# Patient Record
Sex: Female | Born: 1950 | Race: White | Hispanic: No | Marital: Single | Smoking: Never smoker
Health system: Southern US, Community
[De-identification: ages and names within clinical notes are randomized; demographics above are authoritative.]

## PROBLEM LIST (undated history)

## (undated) DIAGNOSIS — K08109 Complete loss of teeth, unspecified cause, unspecified class: Secondary | ICD-10-CM

## (undated) DIAGNOSIS — M1990 Unspecified osteoarthritis, unspecified site: Secondary | ICD-10-CM

## (undated) DIAGNOSIS — I509 Heart failure, unspecified: Secondary | ICD-10-CM

## (undated) DIAGNOSIS — R58 Hemorrhage, not elsewhere classified: Secondary | ICD-10-CM

## (undated) DIAGNOSIS — R918 Other nonspecific abnormal finding of lung field: Secondary | ICD-10-CM

## (undated) DIAGNOSIS — D510 Vitamin B12 deficiency anemia due to intrinsic factor deficiency: Secondary | ICD-10-CM

## (undated) DIAGNOSIS — K922 Gastrointestinal hemorrhage, unspecified: Secondary | ICD-10-CM

## (undated) DIAGNOSIS — J40 Bronchitis, not specified as acute or chronic: Secondary | ICD-10-CM

## (undated) DIAGNOSIS — K7460 Unspecified cirrhosis of liver: Secondary | ICD-10-CM

## (undated) DIAGNOSIS — Z1211 Encounter for screening for malignant neoplasm of colon: Secondary | ICD-10-CM

## (undated) DIAGNOSIS — K279 Peptic ulcer, site unspecified, unspecified as acute or chronic, without hemorrhage or perforation: Secondary | ICD-10-CM

## (undated) DIAGNOSIS — I7300 Raynaud's syndrome without gangrene: Secondary | ICD-10-CM

## (undated) DIAGNOSIS — D500 Iron deficiency anemia secondary to blood loss (chronic): Secondary | ICD-10-CM

## (undated) DIAGNOSIS — Z972 Presence of dental prosthetic device (complete) (partial): Secondary | ICD-10-CM

## (undated) DIAGNOSIS — K760 Fatty (change of) liver, not elsewhere classified: Secondary | ICD-10-CM

## (undated) DIAGNOSIS — D649 Anemia, unspecified: Secondary | ICD-10-CM

## (undated) DIAGNOSIS — E119 Type 2 diabetes mellitus without complications: Secondary | ICD-10-CM

## (undated) DIAGNOSIS — E538 Deficiency of other specified B group vitamins: Secondary | ICD-10-CM

## (undated) HISTORY — DX: Encounter for screening for malignant neoplasm of colon: Z12.11

## (undated) HISTORY — DX: Vitamin B12 deficiency anemia due to intrinsic factor deficiency: D51.0

## (undated) HISTORY — DX: Deficiency of other specified B group vitamins: E53.8

## (undated) HISTORY — PX: DENTAL SURGERY: SHX609

## (undated) HISTORY — DX: Iron deficiency anemia secondary to blood loss (chronic): D50.0

## (undated) HISTORY — PX: TONSILLECTOMY: SUR1361

## (undated) HISTORY — DX: Peptic ulcer, site unspecified, unspecified as acute or chronic, without hemorrhage or perforation: K27.9

## (undated) HISTORY — PX: EYE SURGERY: SHX253

## (undated) HISTORY — DX: Gastrointestinal hemorrhage, unspecified: K92.2

---

## 2015-01-07 ENCOUNTER — Emergency Department: Payer: Self-pay | Admitting: Student

## 2017-05-24 DIAGNOSIS — R5381 Other malaise: Secondary | ICD-10-CM | POA: Diagnosis not present

## 2017-05-24 DIAGNOSIS — D649 Anemia, unspecified: Secondary | ICD-10-CM | POA: Diagnosis not present

## 2017-05-24 DIAGNOSIS — E7800 Pure hypercholesterolemia, unspecified: Secondary | ICD-10-CM | POA: Diagnosis not present

## 2017-05-24 DIAGNOSIS — E119 Type 2 diabetes mellitus without complications: Secondary | ICD-10-CM | POA: Diagnosis not present

## 2017-06-24 DIAGNOSIS — Z23 Encounter for immunization: Secondary | ICD-10-CM | POA: Diagnosis not present

## 2017-06-24 DIAGNOSIS — S8992XA Unspecified injury of left lower leg, initial encounter: Secondary | ICD-10-CM | POA: Diagnosis not present

## 2017-06-24 DIAGNOSIS — S99912A Unspecified injury of left ankle, initial encounter: Secondary | ICD-10-CM | POA: Diagnosis not present

## 2017-06-24 DIAGNOSIS — S8782XA Crushing injury of left lower leg, initial encounter: Secondary | ICD-10-CM | POA: Diagnosis not present

## 2017-06-24 DIAGNOSIS — S99922A Unspecified injury of left foot, initial encounter: Secondary | ICD-10-CM | POA: Diagnosis not present

## 2017-06-27 DIAGNOSIS — S9702XA Crushing injury of left ankle, initial encounter: Secondary | ICD-10-CM | POA: Diagnosis not present

## 2017-06-27 DIAGNOSIS — E669 Obesity, unspecified: Secondary | ICD-10-CM | POA: Diagnosis not present

## 2017-12-12 DIAGNOSIS — H2513 Age-related nuclear cataract, bilateral: Secondary | ICD-10-CM | POA: Diagnosis not present

## 2017-12-12 DIAGNOSIS — E119 Type 2 diabetes mellitus without complications: Secondary | ICD-10-CM | POA: Diagnosis not present

## 2017-12-12 DIAGNOSIS — H52223 Regular astigmatism, bilateral: Secondary | ICD-10-CM | POA: Diagnosis not present

## 2017-12-12 DIAGNOSIS — H5203 Hypermetropia, bilateral: Secondary | ICD-10-CM | POA: Diagnosis not present

## 2018-01-18 DIAGNOSIS — H2513 Age-related nuclear cataract, bilateral: Secondary | ICD-10-CM | POA: Diagnosis not present

## 2018-01-25 DIAGNOSIS — H2511 Age-related nuclear cataract, right eye: Secondary | ICD-10-CM | POA: Diagnosis not present

## 2018-01-26 ENCOUNTER — Other Ambulatory Visit: Payer: Self-pay

## 2018-01-26 ENCOUNTER — Encounter: Payer: Self-pay | Admitting: Emergency Medicine

## 2018-01-26 ENCOUNTER — Ambulatory Visit
Admission: EM | Admit: 2018-01-26 | Discharge: 2018-01-26 | Disposition: A | Discharge status: Home / Self Care | Payer: Medicare HMO | Attending: Family Medicine | Admitting: Family Medicine

## 2018-01-26 DIAGNOSIS — K1120 Sialoadenitis, unspecified: Secondary | ICD-10-CM | POA: Diagnosis not present

## 2018-01-26 HISTORY — DX: Type 2 diabetes mellitus without complications: E11.9

## 2018-01-26 MED ORDER — AMOXICILLIN-POT CLAVULANATE 875-125 MG PO TABS: 1.0000 | ORAL_TABLET | Freq: Two times a day (BID) | ORAL | 0 refills | Status: AC

## 2018-01-26 NOTE — Discharge Instructions (Signed)
Take antibiotics as prescribed.  He may use Tylenol as needed for pain.  Try using sour candies, warm compresses and gentle massage to the swollen gland to help improve infection.  Return to the ER for any fevers, increasing pain, swelling, worsening symptoms or urgent changes in health.

## 2018-01-26 NOTE — ED Provider Notes (Signed)
[facility]    CSN: [id number] Arrival date & time: 01/26/18  0801     History   Chief Complaint Chief Complaint  Patient presents with  . Nasal Congestion  . Fever    HPI Crystal [name] is a 66 y.o. female presents to the urgent care facility for evaluation of right-sided facial swelling, mild runny nose, low-grade fever.  Symptoms been present for 1 day.  She denies any difficulty swallowing, headaches, cough, chest pain, shortness of breath, abdominal pain, nausea or vomiting.  She states she has had mild sinus pain, nasal congestion with increasing right-sided facial swelling along the submandibular gland.  HPI  Past Medical History:  Diagnosis Date  . Diabetes mellitus without complication ([place])     There are no active problems to display for this patient.   History reviewed. No pertinent surgical history.  OB History    No data available       Home Medications    Prior to Admission medications   Medication Sig Start Date End Date Taking? Authorizing Provider  ferrous sulfate 325 (65 FE) MG EC tablet Take 325 mg by mouth daily with breakfast.   Yes [provider]  metFORMIN (GLUCOPHAGE) 1000 MG tablet Take 1,000 mg by mouth 2 (two) times daily with a meal.   Yes [provider]  amoxicillin-clavulanate (AUGMENTIN) 875-125 MG tablet Take 1 tablet by mouth every 12 (twelve) hours for 10 days. 7 days 01/26/18 02/05/18  Duanne Guess, PA-C    Family History History reviewed. No pertinent family history.  Social History Social History   Tobacco Use  . Smoking status: Never Smoker  . Smokeless tobacco: Never Used  Substance Use Topics  . Alcohol use: No    Frequency: Never  . Drug use: Not on file     Allergies   Patient has no known allergies.   Review of Systems Review of Systems  Constitutional: Positive for fever. Negative for chills.  HENT: Positive for congestion, facial swelling and rhinorrhea. Negative  for ear discharge, sinus pressure, sinus pain, sore throat, trouble swallowing and voice change.   Respiratory: Negative for cough, shortness of breath, wheezing and stridor.   Cardiovascular: Negative for chest pain.  Gastrointestinal: Negative for abdominal pain, diarrhea, nausea and vomiting.  Genitourinary: Negative for dysuria, flank pain and pelvic pain.  Musculoskeletal: Negative for back pain and myalgias.  Skin: Negative for rash.  Neurological: Negative for dizziness and headaches.     Physical Exam Triage Vital Signs ED Triage Vitals  Enc Vitals Group     BP 01/26/18 0817 (!) 141/63     Pulse Rate 01/26/18 0817 93     Resp 01/26/18 0817 16     Temp 01/26/18 0817 99.3 F (37.4 C)     Temp Source 01/26/18 0817 Oral     SpO2 01/26/18 0817 96 %     Weight 01/26/18 0813 190 lb (86.2 kg)     Height 01/26/18 0813 5' 3" (1.6 m)     Head Circumference --      Peak Flow --      Pain Score 01/26/18 0813 6     Pain Loc --      Pain Edu? --      Excl. in [place]? --    No data found.  Updated Vital Signs BP (!) 141/63 (BP Location: Left Arm)   Pulse 93   Temp 99.3 F (37.4 C) (Oral)   Resp 16  Ht 5' 3" (1.6 m)   Wt 190 lb (86.2 kg)   SpO2 96%   BMI 33.66 kg/m   Visual Acuity Right Eye Distance:   Left Eye Distance:   Bilateral Distance:    Right Eye Near:   Left Eye Near:    Bilateral Near:     Physical Exam  Constitutional: She is oriented to person, place, and time. She appears well-developed and well-nourished. No distress.  HENT:  Head: Normocephalic and atraumatic.  Right Ear: Hearing, tympanic membrane, external ear and ear canal normal.  Left Ear: Hearing, tympanic membrane, external ear and ear canal normal.  Nose: Rhinorrhea present.  Mouth/Throat: Mucous membranes are normal. No trismus in the jaw. No uvula swelling. Posterior oropharyngeal erythema present. No oropharyngeal exudate, posterior oropharyngeal edema or tonsillar abscesses. No tonsillar  exudate.  Pharynx appears normal, no pharyngeal erythema or exudates.  Uvula is midline.  Patient with no purulent drainage intraorally along the ducts.  She does have submandibular gland swelling on the right side that is tender to palpation.  No significant warmth or redness.  Eyes: Conjunctivae are normal. Right eye exhibits no discharge. Left eye exhibits no discharge.  Neck: Normal range of motion.  Cardiovascular: Normal rate and regular rhythm.  Pulmonary/Chest: Effort normal and breath sounds normal. No stridor. No respiratory distress. She has no wheezes. She has no rales.  Abdominal: Soft. She exhibits no distension. There is no tenderness.  Musculoskeletal: Normal range of motion. She exhibits no deformity.  Lymphadenopathy:    She has cervical adenopathy.  Neurological: She is alert and oriented to person, place, and time. She has normal reflexes.  Skin: Skin is warm and dry.  Psychiatric: She has a normal mood and affect. Her behavior is normal. Thought content normal.     UC Treatments / Results  Labs (all labs ordered are listed, but only abnormal results are displayed) Labs Reviewed - No data to display  EKG  EKG Interpretation None       Radiology No results found.  Procedures Procedures (including critical care time)  Medications Ordered in UC Medications - No data to display   Initial Impression / Assessment and Plan / UC Course  I have reviewed the triage vital signs and the nursing notes.  Pertinent labs & imaging results that were available during my care of the patient were reviewed by me and considered in my medical decision making (see chart for details).    67 year old female with sialadenitis, right-sided facial swelling that began yesterday.  She does have low-grade fever, was started on oral antibiotic, Augmentin.  She is encouraged to use sour candies, warm compresses, gentle compression of the gland.  She is educated on signs and symptoms to  return to the ED or clinic for. Final Clinical Impressions(s) / UC Diagnoses   Final diagnoses:  Sialadenitis    ED Discharge Orders        Ordered    amoxicillin-clavulanate (AUGMENTIN) 875-125 MG tablet  Every 12 hours     01/26/18 0827          Duanne Guess, PA-C 01/26/18 [phone]

## 2018-01-26 NOTE — ED Triage Notes (Signed)
Patient c/o ongoing nasal congestion and redness, swelling and warmth on the right side of her face that started yesterday.  Patient reports low grade fever yesterday.

## 2018-01-30 ENCOUNTER — Other Ambulatory Visit: Payer: Self-pay

## 2018-01-31 NOTE — Discharge Instructions (Signed)
Cataract Surgery, Care After Refer to this sheet in the next few weeks. These instructions provide you with information about caring for yourself after your procedure. Your health care provider may also give you more specific instructions. Your treatment has been planned according to current medical practices, but problems sometimes occur. Call your health care provider if you have any problems or questions after your procedure. What can I expect after the procedure? After the procedure, it is common to have:  Itching.  Discomfort.  Fluid discharge.  Sensitivity to light and to touch.  Bruising.  Follow these instructions at home: [place] your eye every day for signs of infection. Watch for: ? Redness, swelling, or pain. ? Fluid, blood, or pus. ? Warmth. ? Bad smell. Activity  Avoid strenuous activities, such as playing contact sports, for as long as told by your health care provider.  Do not drive or operate heavy machinery until your health care provider approves.  Do not bend or lift heavy objects. Bending increases pressure in the eye. You can walk, climb stairs, and do light household chores.  Ask your health care provider when you can return to work. If you work in a dusty environment, you may be advised to wear protective eyewear for a period of time. General instructions  Take or apply over-the-counter and prescription medicines only as told by your health care provider. This includes eye drops.  Do not touch or rub your eyes.  If you were given a protective shield, wear it as told by your health care provider. If you were not given a protective shield, wear sunglasses as told by your health care provider to protect your eyes.  Keep the area around your eye clean and dry. Avoid swimming or allowing water to hit you directly in the face while showering until told by your health care provider. Keep soap and shampoo out of your eyes.  Do not put a contact lens  into the affected eye or eyes until your health care provider approves.  Keep all follow-up visits as told by your health care provider. This is important. Contact a health care provider if:   You have increased bruising around your eye.  You have pain that is not helped with medicine.  You have a fever.  You have redness, swelling, or pain in your eye.  You have fluid, blood, or pus coming from your incision.  Your vision gets worse. Get help right away if:  You have sudden vision loss. This information is not intended to replace advice given to you by your health care provider. Make sure you discuss any questions you have with your health care provider. Document Released: 05/19/2005 Document Revised: 03/09/2016 Document Reviewed: 09/09/2015 Elsevier Interactive Patient Education  2018 [place] Anesthesia, Adult, Care After These instructions provide you with information about caring for yourself after your procedure. Your health care provider may also give you more specific instructions. Your treatment has been planned according to current medical practices, but problems sometimes occur. Call your health care provider if you have any problems or questions after your procedure. What can I expect after the procedure? After the procedure, it is common to have:  Vomiting.  A sore throat.  Mental slowness.  It is common to feel:  Nauseous.  Cold or shivery.  Sleepy.  Tired.  Sore or achy, even in parts of your body where you did not have surgery.  Follow these instructions at home: For at least  24 hours after the procedure:  Do not: ? Participate in activities where you could fall or become injured. ? Drive. ? Use heavy machinery. ? Drink alcohol. ? Take sleeping pills or medicines that cause drowsiness. ? Make important decisions or sign legal documents. ? Take care of children on your own.  Rest. Eating and drinking  If you vomit, drink water,  juice, or soup when you can drink without vomiting.  Drink enough fluid to keep your urine clear or pale yellow.  Make sure you have little or no nausea before eating solid foods.  Follow the diet recommended by your health care provider. General instructions  Have a responsible adult stay with you until you are awake and alert.  Return to your normal activities as told by your health care provider. Ask your health care provider what activities are safe for you.  Take over-the-counter and prescription medicines only as told by your health care provider.  If you smoke, do not smoke without supervision.  Keep all follow-up visits as told by your health care provider. This is important. Contact a health care provider if:  You continue to have nausea or vomiting at home, and medicines are not helpful.  You cannot drink fluids or start eating again.  You cannot urinate after 8-12 hours.  You develop a skin rash.  You have fever.  You have increasing redness at the site of your procedure. Get help right away if:  You have difficulty breathing.  You have chest pain.  You have unexpected bleeding.  You feel that you are having a life-threatening or urgent problem. This information is not intended to replace advice given to you by your health care provider. Make sure you discuss any questions you have with your health care provider. Document Released: 02/05/2001 Document Revised: 04/03/2016 Document Reviewed: 10/14/2015 Elsevier Interactive Patient Education  [employer].

## 2018-02-06 ENCOUNTER — Ambulatory Visit: Payer: Medicare HMO | Admitting: Anesthesiology

## 2018-02-06 ENCOUNTER — Ambulatory Visit
Admission: RE | Admit: 2018-02-06 | Discharge: 2018-02-06 | Disposition: A | Discharge status: Home / Self Care | Payer: Medicare HMO | Source: Ambulatory Visit | Attending: Ophthalmology | Admitting: Ophthalmology

## 2018-02-06 ENCOUNTER — Encounter
Admission: RE | Disposition: A | Discharge status: Home / Self Care | Payer: Self-pay | Source: Ambulatory Visit | Attending: Ophthalmology

## 2018-02-06 DIAGNOSIS — K920 Hematemesis: Secondary | ICD-10-CM | POA: Diagnosis not present

## 2018-02-06 DIAGNOSIS — Z7984 Long term (current) use of oral hypoglycemic drugs: Secondary | ICD-10-CM | POA: Insufficient documentation

## 2018-02-06 DIAGNOSIS — E119 Type 2 diabetes mellitus without complications: Secondary | ICD-10-CM

## 2018-02-06 DIAGNOSIS — Z79899 Other long term (current) drug therapy: Secondary | ICD-10-CM

## 2018-02-06 DIAGNOSIS — H25811 Combined forms of age-related cataract, right eye: Secondary | ICD-10-CM | POA: Diagnosis not present

## 2018-02-06 DIAGNOSIS — I8501 Esophageal varices with bleeding: Secondary | ICD-10-CM | POA: Diagnosis not present

## 2018-02-06 DIAGNOSIS — D649 Anemia, unspecified: Secondary | ICD-10-CM | POA: Insufficient documentation

## 2018-02-06 DIAGNOSIS — H2511 Age-related nuclear cataract, right eye: Secondary | ICD-10-CM | POA: Insufficient documentation

## 2018-02-06 HISTORY — DX: Complete loss of teeth, unspecified cause, unspecified class: Z97.2

## 2018-02-06 HISTORY — DX: Anemia, unspecified: D64.9

## 2018-02-06 HISTORY — PX: CATARACT EXTRACTION W/PHACO: SHX586

## 2018-02-06 HISTORY — DX: Complete loss of teeth, unspecified cause, unspecified class: K08.109

## 2018-02-06 HISTORY — DX: Unspecified osteoarthritis, unspecified site: M19.90

## 2018-02-06 HISTORY — DX: Bronchitis, not specified as acute or chronic: J40

## 2018-02-06 LAB — GLUCOSE, CAPILLARY: Glucose-Capillary: 123 mg/dL — ABNORMAL HIGH (ref 65–99)

## 2018-02-06 SURGERY — PHACOEMULSIFICATION, CATARACT, WITH IOL INSERTION
Anesthesia: Monitor Anesthesia Care | Site: Eye | Laterality: Right | Wound class: "Clean "

## 2018-02-06 MED ORDER — MOXIFLOXACIN HCL 0.5 % OP SOLN
1.0000 [drp] | OPHTHALMIC | Status: DC | PRN
  Administered 2018-02-06 (×3): 1 [drp] via OPHTHALMIC

## 2018-02-06 MED ORDER — ARMC OPHTHALMIC DILATING DROPS
1.0000 "application " | OPHTHALMIC | Status: DC | PRN
  Administered 2018-02-06 (×3): 1 via OPHTHALMIC

## 2018-02-06 MED ORDER — ONDANSETRON HCL 4 MG/2ML IJ SOLN: 4.0000 mg | Freq: Once | INTRAMUSCULAR | Status: DC | PRN

## 2018-02-06 MED ORDER — CEFUROXIME OPHTHALMIC INJECTION 1 MG/0.1 ML
INJECTION | OPHTHALMIC | Status: DC | PRN
  Administered 2018-02-06: 0.1 mL via OPHTHALMIC

## 2018-02-06 MED ORDER — NA HYALUR & NA CHOND-NA HYALUR 0.4-0.35 ML IO KIT
PACK | INTRAOCULAR | Status: DC | PRN
  Administered 2018-02-06: 1 mL via INTRAOCULAR

## 2018-02-06 MED ORDER — EPINEPHRINE PF 1 MG/ML IJ SOLN
INTRAOCULAR | Status: DC | PRN
  Administered 2018-02-06: 88 mL via OPHTHALMIC

## 2018-02-06 MED ORDER — LACTATED RINGERS IV SOLN: INTRAVENOUS | Status: DC

## 2018-02-06 MED ORDER — FENTANYL CITRATE (PF) 100 MCG/2ML IJ SOLN
INTRAMUSCULAR | Status: DC | PRN
  Administered 2018-02-06: 50 ug via INTRAVENOUS

## 2018-02-06 MED ORDER — ACETAMINOPHEN 160 MG/5ML PO SOLN: 325.0000 mg | ORAL | Status: DC | PRN

## 2018-02-06 MED ORDER — LIDOCAINE HCL (PF) 2 % IJ SOLN
INTRAOCULAR | Status: DC | PRN
  Administered 2018-02-06: 1 mL

## 2018-02-06 MED ORDER — ACETAMINOPHEN 325 MG PO TABS: 650.0000 mg | ORAL_TABLET | Freq: Once | ORAL | Status: DC | PRN

## 2018-02-06 MED ORDER — BRIMONIDINE TARTRATE-TIMOLOL 0.2-0.5 % OP SOLN
OPHTHALMIC | Status: DC | PRN
  Administered 2018-02-06: 1 [drp] via OPHTHALMIC

## 2018-02-06 MED ORDER — MIDAZOLAM HCL 2 MG/2ML IJ SOLN
INTRAMUSCULAR | Status: DC | PRN
  Administered 2018-02-06: 2 mg via INTRAVENOUS

## 2018-02-06 SURGICAL SUPPLY — 27 items

## 2018-02-06 NOTE — Anesthesia Procedure Notes (Signed)
Procedure Name: MAC Performed by: Mayme Genta, CRNA Pre-anesthesia Checklist: Patient identified, Emergency Drugs available, Suction available, Timeout performed and Patient being monitored Patient Re-evaluated:Patient Re-evaluated prior to induction Oxygen Delivery Method: Nasal cannula Placement Confirmation: positive ETCO2

## 2018-02-06 NOTE — Anesthesia Postprocedure Evaluation (Signed)
Anesthesia Post Note  Patient: Crystal [name]  Procedure(s) Performed: CATARACT EXTRACTION PHACO AND INTRAOCULAR LENS PLACEMENT (IOC) RIGHT DIABETIC (Right Eye)  Patient location during evaluation: PACU Anesthesia Type: MAC Level of consciousness: awake and alert, oriented and patient cooperative Pain management: pain level controlled Vital Signs Assessment: post-procedure vital signs reviewed and stable Respiratory status: spontaneous breathing, nonlabored ventilation and respiratory function stable Cardiovascular status: blood pressure returned to baseline and stable Postop Assessment: adequate PO intake Anesthetic complications: no    Darrin Nipper

## 2018-02-06 NOTE — H&P (Signed)
The History and Physical notes are on paper, have been signed, and are to be scanned. The patient remains stable and unchanged from the H&P.   Previous H&P reviewed, patient examined, and there are no changes.  Crystal [name] 02/06/2018 7:41 AM

## 2018-02-06 NOTE — Op Note (Signed)
LOCATION:  [facility]   PREOPERATIVE DIAGNOSIS:    Nuclear sclerotic cataract right eye. H25.11   POSTOPERATIVE DIAGNOSIS:  Nuclear sclerotic cataract right eye.     PROCEDURE:  Phacoemusification with posterior chamber intraocular lens placement of the right eye   LENS:   Implant Name Type Inv. Item Serial No. Manufacturer Lot No. LRB No. Used  LENS IOL DIOP 24.5 - [id number] Intraocular Lens LENS IOL DIOP 24.5 [id number]  Right 1        ULTRASOUND TIME: 23 % of 2 minutes, 22 seconds.  CDE 33.3   SURGEON:  Wyonia Hough, MD   ANESTHESIA:  Topical with tetracaine drops and 2% Xylocaine jelly, augmented with 1% preservative-free intracameral lidocaine.    COMPLICATIONS:  None.   DESCRIPTION OF PROCEDURE:  The patient was identified in the holding room and transported to the operating room and placed in the supine position under the operating microscope.  The right eye was identified as the operative eye and it was prepped and draped in the usual sterile ophthalmic fashion.   A 1 millimeter clear-corneal paracentesis was made at the 12:00 position.  0.5 ml of preservative-free 1% lidocaine was injected into the anterior chamber. The anterior chamber was filled with Viscoat viscoelastic.  A 2.4 millimeter keratome was used to make a near-clear corneal incision at the 9:00 position.  A curvilinear capsulorrhexis was made with a cystotome and capsulorrhexis forceps.  Balanced salt solution was used to hydrodissect and hydrodelineate the nucleus.   Phacoemulsification was then used in stop and chop fashion to remove the lens nucleus and epinucleus.  The remaining cortex was then removed using the irrigation and aspiration handpiece. Provisc was then placed into the capsular bag to distend it for lens placement.  A lens was then injected into the capsular bag.  The remaining viscoelastic was aspirated.   Wounds were hydrated with balanced salt solution.  The anterior  chamber was inflated to a physiologic pressure with balanced salt solution.  No wound leaks were noted. Cefuroxime 0.1 ml of a 63m/ml solution was injected into the anterior chamber for a dose of 1 mg of intracameral antibiotic at the completion of the case.   Timolol and Brimonidine drops were applied to the eye.  The patient was taken to the recovery room in stable condition without complications of anesthesia or surgery.   Ashling Roane 02/06/2018, 8:04 AM

## 2018-02-06 NOTE — Anesthesia Preprocedure Evaluation (Signed)
Anesthesia Evaluation  Patient identified by MRN, date of birth, ID band Patient awake    Reviewed: Allergy & Precautions, NPO status , Patient's Chart, lab work & pertinent test results  History of Anesthesia Complications Negative for: history of anesthetic complications  Airway Mallampati: III  TM Distance: >3 FB Neck ROM: Full    Dental  (+) Lower Dentures, Upper Dentures   Pulmonary neg pulmonary ROS,    Pulmonary exam normal breath sounds clear to auscultation       Cardiovascular Exercise Tolerance: Good negative cardio ROS Normal cardiovascular exam Rhythm:Regular Rate:Normal     Neuro/Psych negative neurological ROS     GI/Hepatic negative GI ROS,   Endo/Other  diabetes, Type 2  Renal/GU negative Renal ROS     Musculoskeletal  (+) Arthritis ,   Abdominal   Peds  Hematology  (+) Blood dyscrasia, anemia ,   Anesthesia Other Findings   Reproductive/Obstetrics                             Anesthesia Physical Anesthesia Plan  ASA: II  Anesthesia Plan: MAC   Post-op Pain Management:    Induction: Intravenous  PONV Risk Score and Plan: 2 and TIVA and Midazolam  Airway Management Planned: Natural Airway  Additional Equipment:   Intra-op Plan:   Post-operative Plan:   Informed Consent: I have reviewed the patients History and Physical, chart, labs and discussed the procedure including the risks, benefits and alternatives for the proposed anesthesia with the patient or authorized representative who has indicated his/her understanding and acceptance.     Plan Discussed with: CRNA  Anesthesia Plan Comments:         Anesthesia Quick Evaluation

## 2018-02-06 NOTE — Transfer of Care (Signed)
Immediate Anesthesia Transfer of Care Note  Patient: Crystal [name]  Procedure(s) Performed: CATARACT EXTRACTION PHACO AND INTRAOCULAR LENS PLACEMENT (IOC) RIGHT DIABETIC (Right Eye)  Patient Location: PACU  Anesthesia Type: MAC  Level of Consciousness: awake, alert  and patient cooperative  Airway and Oxygen Therapy: Patient Spontanous Breathing and Patient connected to supplemental oxygen  Post-op Assessment: Post-op Vital signs reviewed, Patient's Cardiovascular Status Stable, Respiratory Function Stable, Patent Airway and No signs of Nausea or vomiting  Post-op Vital Signs: Reviewed and stable  Complications: No apparent anesthesia complications

## 2018-02-07 ENCOUNTER — Encounter: Payer: Self-pay | Admitting: Ophthalmology

## 2018-02-09 ENCOUNTER — Other Ambulatory Visit: Payer: Self-pay

## 2018-02-09 ENCOUNTER — Encounter: Payer: Self-pay | Admitting: Emergency Medicine

## 2018-02-09 ENCOUNTER — Inpatient Hospital Stay
Admission: EM | Admit: 2018-02-09 | Discharge: 2018-02-12 | DRG: 988 | Disposition: A | Discharge status: Home / Self Care | Payer: Medicare HMO | Attending: Internal Medicine | Admitting: Internal Medicine

## 2018-02-09 ENCOUNTER — Inpatient Hospital Stay: Payer: Medicare HMO

## 2018-02-09 DIAGNOSIS — D509 Iron deficiency anemia, unspecified: Secondary | ICD-10-CM | POA: Diagnosis present

## 2018-02-09 DIAGNOSIS — D500 Iron deficiency anemia secondary to blood loss (chronic): Secondary | ICD-10-CM

## 2018-02-09 DIAGNOSIS — K7460 Unspecified cirrhosis of liver: Secondary | ICD-10-CM | POA: Diagnosis present

## 2018-02-09 DIAGNOSIS — K5900 Constipation, unspecified: Secondary | ICD-10-CM | POA: Diagnosis present

## 2018-02-09 DIAGNOSIS — R000 Tachycardia, unspecified: Secondary | ICD-10-CM | POA: Diagnosis not present

## 2018-02-09 DIAGNOSIS — E119 Type 2 diabetes mellitus without complications: Secondary | ICD-10-CM | POA: Diagnosis not present

## 2018-02-09 DIAGNOSIS — K922 Gastrointestinal hemorrhage, unspecified: Secondary | ICD-10-CM | POA: Diagnosis present

## 2018-02-09 DIAGNOSIS — H2511 Age-related nuclear cataract, right eye: Secondary | ICD-10-CM | POA: Diagnosis not present

## 2018-02-09 DIAGNOSIS — Z7984 Long term (current) use of oral hypoglycemic drugs: Secondary | ICD-10-CM | POA: Diagnosis not present

## 2018-02-09 DIAGNOSIS — M19019 Primary osteoarthritis, unspecified shoulder: Secondary | ICD-10-CM | POA: Diagnosis not present

## 2018-02-09 DIAGNOSIS — I10 Essential (primary) hypertension: Secondary | ICD-10-CM | POA: Diagnosis not present

## 2018-02-09 DIAGNOSIS — I8501 Esophageal varices with bleeding: Principal | ICD-10-CM | POA: Diagnosis present

## 2018-02-09 DIAGNOSIS — R109 Unspecified abdominal pain: Secondary | ICD-10-CM | POA: Diagnosis not present

## 2018-02-09 DIAGNOSIS — K920 Hematemesis: Secondary | ICD-10-CM

## 2018-02-09 DIAGNOSIS — R52 Pain, unspecified: Secondary | ICD-10-CM

## 2018-02-09 DIAGNOSIS — Z79899 Other long term (current) drug therapy: Secondary | ICD-10-CM

## 2018-02-09 DIAGNOSIS — K921 Melena: Secondary | ICD-10-CM | POA: Diagnosis not present

## 2018-02-09 DIAGNOSIS — M1990 Unspecified osteoarthritis, unspecified site: Secondary | ICD-10-CM | POA: Diagnosis not present

## 2018-02-09 DIAGNOSIS — R040 Epistaxis: Secondary | ICD-10-CM | POA: Diagnosis not present

## 2018-02-09 DIAGNOSIS — D649 Anemia, unspecified: Secondary | ICD-10-CM | POA: Diagnosis not present

## 2018-02-09 DIAGNOSIS — D62 Acute posthemorrhagic anemia: Secondary | ICD-10-CM | POA: Diagnosis present

## 2018-02-09 DIAGNOSIS — R918 Other nonspecific abnormal finding of lung field: Secondary | ICD-10-CM | POA: Diagnosis not present

## 2018-02-09 DIAGNOSIS — R1011 Right upper quadrant pain: Secondary | ICD-10-CM

## 2018-02-09 HISTORY — DX: Gastrointestinal hemorrhage, unspecified: K92.2

## 2018-02-09 LAB — COMPREHENSIVE METABOLIC PANEL
ALT: 17 U/L (ref 14–54)
AST: 30 U/L (ref 15–41)
Albumin: 2.7 g/dL — ABNORMAL LOW (ref 3.5–5.0)
Alkaline Phosphatase: 162 U/L — ABNORMAL HIGH (ref 38–126)
Anion gap: 9 (ref 5–15)
BUN: 14 mg/dL (ref 6–20)
CO2: 21 mmol/L — ABNORMAL LOW (ref 22–32)
Calcium: 8.4 mg/dL — ABNORMAL LOW (ref 8.9–10.3)
Chloride: 108 mmol/L (ref 101–111)
Creatinine, Ser: 0.56 mg/dL (ref 0.44–1.00)
GFR calc Af Amer: >60 mL/min (ref >60)
GFR calc non Af Amer: >60 mL/min (ref >60)
Glucose, Bld: 187 mg/dL — ABNORMAL HIGH (ref 65–99)
Potassium: 4 mmol/L (ref 3.5–5.1)
Sodium: 138 mmol/L (ref 135–145)
Total Bilirubin: 0.9 mg/dL (ref 0.3–1.2)
Total Protein: 6.8 g/dL (ref 6.5–8.1)

## 2018-02-09 LAB — RETICULOCYTES
RBC.: 3.63 MIL/uL — ABNORMAL LOW (ref 3.80–5.20)
Retic Count, Absolute: 61.7 10*3/uL (ref 19.0–183.0)
Retic Ct Pct: 1.7 % (ref 0.4–3.1)

## 2018-02-09 LAB — IRON AND TIBC
Iron: 20 ug/dL — ABNORMAL LOW (ref 28–170)
Saturation Ratios: 6 % — ABNORMAL LOW (ref 10.4–31.8)
TIBC: 313 ug/dL (ref 250–450)
UIBC: 293 ug/dL

## 2018-02-09 LAB — CBC
HCT: 22.5 % — ABNORMAL LOW (ref 35.0–47.0)
Hemoglobin: 6.8 g/dL — ABNORMAL LOW (ref 12.0–16.0)
MCH: 19.3 pg — ABNORMAL LOW (ref 26.0–34.0)
MCHC: 30.1 g/dL — ABNORMAL LOW (ref 32.0–36.0)
MCV: 63.9 fL — ABNORMAL LOW (ref 80.0–100.0)
Platelets: 305 10*3/uL (ref 150–440)
RBC: 3.52 MIL/uL — ABNORMAL LOW (ref 3.80–5.20)
RDW: 20.3 % — ABNORMAL HIGH (ref 11.5–14.5)
WBC: 9.5 10*3/uL (ref 3.6–11.0)

## 2018-02-09 LAB — GLUCOSE, CAPILLARY
Glucose-Capillary: 137 mg/dL — ABNORMAL HIGH (ref 65–99)
Glucose-Capillary: 154 mg/dL — ABNORMAL HIGH (ref 65–99)
Glucose-Capillary: 83 mg/dL (ref 65–99)

## 2018-02-09 LAB — VITAMIN B12: Vitamin B-12: 103 pg/mL — ABNORMAL LOW (ref 180–914)

## 2018-02-09 LAB — ABO/RH: ABO/RH(D): O POS

## 2018-02-09 LAB — FOLATE: Folate: 14.3 ng/mL (ref >5.9)

## 2018-02-09 LAB — PREPARE RBC (CROSSMATCH)

## 2018-02-09 LAB — FERRITIN: Ferritin: 6 ng/mL — ABNORMAL LOW (ref 11–307)

## 2018-02-09 LAB — HEMOGLOBIN: Hemoglobin: 6.4 g/dL — ABNORMAL LOW (ref 12.0–16.0)

## 2018-02-09 MED ORDER — INSULIN ASPART 100 UNIT/ML ~~LOC~~ SOLN: 0.0000 [IU] | Freq: Every day | SUBCUTANEOUS | Status: DC

## 2018-02-09 MED ORDER — METFORMIN HCL 500 MG PO TABS
1000.0000 mg | ORAL_TABLET | Freq: Two times a day (BID) | ORAL | Status: DC
  Filled 2018-02-09: qty 2

## 2018-02-09 MED ORDER — OCTREOTIDE ACETATE 100 MCG/ML IJ SOLN
50.0000 ug | Freq: Once | INTRAMUSCULAR | Status: DC
  Filled 2018-02-09: qty 0.5

## 2018-02-09 MED ORDER — SODIUM CHLORIDE 0.9 % IV SOLN
80.0000 mg | Freq: Once | INTRAVENOUS | Status: AC
  Administered 2018-02-09: 80 mg via INTRAVENOUS
  Filled 2018-02-09: qty 80

## 2018-02-09 MED ORDER — ACETAMINOPHEN 650 MG RE SUPP: 650.0000 mg | Freq: Four times a day (QID) | RECTAL | Status: DC | PRN

## 2018-02-09 MED ORDER — SODIUM CHLORIDE 0.9 % IV SOLN
10.0000 mL/h | Freq: Once | INTRAVENOUS | Status: AC
  Administered 2018-02-09: 10 mL/h via INTRAVENOUS

## 2018-02-09 MED ORDER — SODIUM CHLORIDE 0.9 % IV SOLN
Freq: Once | INTRAVENOUS | Status: AC
  Administered 2018-02-09: 20:00:00 via INTRAVENOUS

## 2018-02-09 MED ORDER — PEG 3350-KCL-NA BICARB-NACL 420 G PO SOLR: 4000.0000 mL | Freq: Once | ORAL | Status: DC

## 2018-02-09 MED ORDER — ONDANSETRON HCL 4 MG/2ML IJ SOLN
4.0000 mg | Freq: Four times a day (QID) | INTRAMUSCULAR | Status: DC | PRN
  Administered 2018-02-11 (×2): 4 mg via INTRAVENOUS
  Filled 2018-02-09 (×2): qty 2

## 2018-02-09 MED ORDER — PANTOPRAZOLE SODIUM 40 MG IV SOLR: 40.0000 mg | Freq: Two times a day (BID) | INTRAVENOUS | Status: DC

## 2018-02-09 MED ORDER — ACETAMINOPHEN 325 MG PO TABS: 650.0000 mg | ORAL_TABLET | Freq: Four times a day (QID) | ORAL | Status: DC | PRN

## 2018-02-09 MED ORDER — SODIUM CHLORIDE 0.9 % IV SOLN
200.0000 mg | INTRAVENOUS | Status: AC
  Administered 2018-02-09 – 2018-02-10 (×2): 200 mg via INTRAVENOUS
  Filled 2018-02-09 (×3): qty 10

## 2018-02-09 MED ORDER — PANTOPRAZOLE SODIUM 40 MG IV SOLR
40.0000 mg | Freq: Once | INTRAVENOUS | Status: AC
  Administered 2018-02-09: 40 mg via INTRAVENOUS
  Filled 2018-02-09: qty 40

## 2018-02-09 MED ORDER — SODIUM CHLORIDE 0.9 % IV SOLN
25.0000 ug/h | INTRAVENOUS | Status: DC
  Administered 2018-02-10 – 2018-02-12 (×4): 25 ug/h via INTRAVENOUS
  Filled 2018-02-09 (×9): qty 1

## 2018-02-09 MED ORDER — SODIUM CHLORIDE 0.9 % IV SOLN
8.0000 mg/h | INTRAVENOUS | Status: DC
  Administered 2018-02-09 – 2018-02-12 (×7): 8 mg/h via INTRAVENOUS
  Filled 2018-02-09 (×8): qty 80

## 2018-02-09 MED ORDER — INSULIN ASPART 100 UNIT/ML ~~LOC~~ SOLN
0.0000 [IU] | Freq: Three times a day (TID) | SUBCUTANEOUS | Status: DC
  Administered 2018-02-09 – 2018-02-10 (×2): 2 [IU] via SUBCUTANEOUS
  Administered 2018-02-11: 12:00:00 1 [IU] via SUBCUTANEOUS
  Administered 2018-02-11: 09:00:00 2 [IU] via SUBCUTANEOUS
  Administered 2018-02-12 (×2): 1 [IU] via SUBCUTANEOUS
  Filled 2018-02-09 (×7): qty 1

## 2018-02-09 MED ORDER — SODIUM CHLORIDE 0.9 % IV SOLN
INTRAVENOUS | Status: DC
  Administered 2018-02-09: 15:00:00 via INTRAVENOUS

## 2018-02-09 MED ORDER — TECHNETIUM TC 99M-LABELED RED BLOOD CELLS IV KIT
20.0000 | PACK | Freq: Once | INTRAVENOUS | Status: AC | PRN
  Administered 2018-02-09: 20:00:00 20.9 via INTRAVENOUS

## 2018-02-09 MED ORDER — FERROUS SULFATE 325 (65 FE) MG PO TABS
325.0000 mg | ORAL_TABLET | Freq: Every day | ORAL | Status: DC
  Administered 2018-02-09 – 2018-02-12 (×3): 325 mg via ORAL
  Filled 2018-02-09 (×3): qty 1

## 2018-02-09 MED ORDER — IOPAMIDOL (ISOVUE-300) INJECTION 61%
100.0000 mL | Freq: Once | INTRAVENOUS | Status: AC | PRN
  Administered 2018-02-09: 100 mL via INTRAVENOUS

## 2018-02-09 MED ORDER — SODIUM CHLORIDE 0.9 % IV SOLN
Freq: Once | INTRAVENOUS | Status: AC
  Administered 2018-02-09: 09:00:00 via INTRAVENOUS

## 2018-02-09 MED ORDER — SODIUM CHLORIDE 0.9 % IV SOLN
INTRAVENOUS | Status: DC
  Administered 2018-02-10: 08:00:00 via INTRAVENOUS

## 2018-02-09 MED ORDER — ONDANSETRON HCL 4 MG PO TABS: 4.0000 mg | ORAL_TABLET | Freq: Four times a day (QID) | ORAL | Status: DC | PRN

## 2018-02-09 MED ORDER — OCTREOTIDE ACETATE 50 MCG/ML IJ SOLN
50.0000 ug | Freq: Once | INTRAMUSCULAR | Status: AC
  Administered 2018-02-09: 50 ug via INTRAVENOUS
  Filled 2018-02-09: qty 1

## 2018-02-09 MED ORDER — DEXTROSE-NACL 5-0.45 % IV SOLN
INTRAVENOUS | Status: AC
  Administered 2018-02-09 – 2018-02-10 (×2): via INTRAVENOUS

## 2018-02-09 NOTE — Progress Notes (Signed)
3rd loose dark red blood, hyperactive bowel sounds, notified Dr. Vicente Males and Dr. Tressia Miners, received verbal order from Dr. Tressia Miners for a GI bleeding scan, 2nd unit of RBC ordered.

## 2018-02-09 NOTE — Progress Notes (Signed)
Patient complaining of abdominal pain in right upper quadrant, hemoglobin down from 6.8 to 6.4 after 1 unit of blood. Notified Dr. Tressia Miners, received verbal order for 1 unit of blood to be given. The patient is not having emesis or bloody stool. Dr. Tressia Miners does not want a protonix drip ordered at this time. Received verbal order for CT scan of abdomen and x-ray.

## 2018-02-09 NOTE — ED Triage Notes (Signed)
First Nurse Note:  C/O vomiting blood since 0200.  Reports 4 episodes of emesis with increasing amounts of red emesis.  States the last episode patent saw some clots.

## 2018-02-09 NOTE — ED Notes (Signed)
Dr. Wynelle Bourgeois at bedside at this time.

## 2018-02-09 NOTE — Progress Notes (Signed)
Nurse reported that patient had a bloody bowel movement, fresh blood mixed with dark blood. Vitals are stable. -Just had a CT of the abdomen. -Discussed with GI. His vitals are stable, continue to monitor. Has severe iron deficiency. We will hold off on bowel prep for now. EGD tomorrow and maybe colonoscopy on Monday. -We'll keep her nothing by mouth, if continues to have further bleeding, we'll order a GI bleeding scan. - 2nd unit prbc ordered

## 2018-02-09 NOTE — H&P (Signed)
[facility] NAME: Crystal [name]    MR#:  [id number]  DATE OF BIRTH:  [birth year]  DATE OF ADMISSION:  02/09/2018  PRIMARY CARE PHYSICIAN: Lorelee Market, MD   REQUESTING/REFERRING PHYSICIAN: Dr. Lenise Arena  CHIEF COMPLAINT:   Chief Complaint  Patient presents with  . GI Bleeding    HISTORY OF PRESENT ILLNESS:  Crystal [name]  is a 67 y.o. female with a known history of iron deficiency anemia unknown last hemoglobin, arthritis, diabetes mellitus presents to hospital secondary to nausea and vomiting blood. Patient has been in her normal state of health up until last night when she went to bed. She woke up around middle of the night and started throwing up fresh blood. She had 3 of those episodes. The last 2 episodes were more dark blood. She is known history of fine deficiency anemia and supposed to take iron pills which she hasn't been taking consistently. Her hemoglobin here is 6.8 and there is no baseline available to compare. She is getting 1 unit of packed RBC here for tachycardia and symptomatic anemia. GI has been consulted. No other illnesses reported.  PAST MEDICAL HISTORY:   Past Medical History:  Diagnosis Date  . Anemia    TAKES IRON TAB  . Arthritis    SHOULDER  . Bronchitis    HX OF  . Diabetes mellitus without complication ([place])    TYPE 2  . Full dentures    UPPER AND LOWER    PAST SURGICAL HISTORY:   Past Surgical History:  Procedure Laterality Date  . CATARACT EXTRACTION W/PHACO Right 02/06/2018   Procedure: CATARACT EXTRACTION PHACO AND INTRAOCULAR LENS PLACEMENT ([place]) RIGHT DIABETIC;  Surgeon: Leandrew Koyanagi, MD;  Location: [facility];  Service: Ophthalmology;  Laterality: Right;  DIABETIC, ORAL MED  . DENTAL SURGERY     EXTRACTIONS    SOCIAL HISTORY:   Social History   Tobacco Use  . Smoking status: Never Smoker  . Smokeless tobacco: Never Used  Substance Use Topics  .  Alcohol use: No    Frequency: Never    FAMILY HISTORY:   Family History  Problem Relation Age of Onset  . CAD Father     DRUG ALLERGIES:  No Known Allergies  REVIEW OF SYSTEMS:   Review of Systems  Constitutional: Positive for malaise/fatigue. Negative for chills, fever and weight loss.  HENT: Negative for ear discharge, ear pain, hearing loss, nosebleeds and tinnitus.   Eyes: Negative for blurred vision, double vision and photophobia.  Respiratory: Negative for cough, hemoptysis, shortness of breath and wheezing.   Cardiovascular: Negative for chest pain, palpitations, orthopnea and leg swelling.  Gastrointestinal: Negative for abdominal pain, constipation, diarrhea, heartburn, melena, nausea and vomiting.       Vomiting blood  Genitourinary: Negative for dysuria, frequency, hematuria and urgency.  Musculoskeletal: Negative for back pain, myalgias and neck pain.  Skin: Negative for rash.  Neurological: Negative for dizziness, tingling, tremors, sensory change, speech change, focal weakness and headaches. Loss of consciousness:    Endo/Heme/Allergies: Does not bruise/bleed easily.  Psychiatric/Behavioral: Negative for depression.    MEDICATIONS AT HOME:   Prior to Admission medications   Medication Sig Start Date End Date Taking? Authorizing Provider  diphenhydrAMINE (BENADRYL) 25 MG tablet Take 50 mg by mouth at bedtime as needed for itching, allergies or sleep.    Yes [provider]  ferrous sulfate 325 (65 FE) MG EC tablet Take 325 mg by  mouth daily.    Yes [provider]  fluconazole (DIFLUCAN) 150 MG tablet Take 150 mg by mouth daily as needed (yeast).  01/03/18  Yes [provider]  metFORMIN (GLUCOPHAGE) 1000 MG tablet Take 1,000 mg by mouth 2 (two) times daily with a meal. AM AND PM   Yes [provider]      VITAL SIGNS:  Blood pressure 117/62, pulse 82, temperature 98.6 F (37 C), temperature source Oral, resp. rate 16,  height 5' 3" (1.6 m), weight 88 kg (194 lb), SpO2 100 %.  PHYSICAL EXAMINATION:   Physical Exam  GENERAL:  67 y.o.-year-old patient lying in the bed with no acute distress.  EYES: Pupils equal, round, reactive to light and accommodation. No scleral icterus. Extraocular muscles intact. . Pale Conjunctiva noted HEENT: Head atraumatic, normocephalic. Oropharynx and nasopharynx clear.  NECK:  Supple, no jugular venous distention. No thyroid enlargement, no tenderness.  LUNGS: Normal breath sounds bilaterally, no wheezing, rales,rhonchi or crepitation. No use of accessory muscles of respiration.  CARDIOVASCULAR: S1, S2 normal. No murmurs, rubs, or gallops.  ABDOMEN: Soft, nontender, nondistended. Bowel sounds present. No organomegaly or mass.  EXTREMITIES: No pedal edema, cyanosis, or clubbing.  NEUROLOGIC: Cranial nerves II through XII are intact. Muscle strength 5/5 in all extremities. Sensation intact. Gait not checked.  PSYCHIATRIC: The patient is alert and oriented x 3.  SKIN: No obvious rash, lesion, or ulcer. Non blanchable petchaie on face and palms  LABORATORY PANEL:   CBC Recent Labs  Lab 02/09/18 0827  WBC 9.5  HGB 6.8*  HCT 22.5*  PLT 305   ------------------------------------------------------------------------------------------------------------------  Chemistries  Recent Labs  Lab 02/09/18 0827  NA 138  K 4.0  CL 108  CO2 21*  GLUCOSE 187*  BUN 14  CREATININE 0.56  CALCIUM 8.4*  AST 30  ALT 17  ALKPHOS 162*  BILITOT 0.9   ------------------------------------------------------------------------------------------------------------------  Cardiac Enzymes No results for input(s): TROPONINI in the last 168 hours. ------------------------------------------------------------------------------------------------------------------  RADIOLOGY:  No results found.  EKG:  No orders found for this or any previous visit.  IMPRESSION AND PLAN:   Crystal [name]  is a  67 y.o. female with a known history of iron deficiency anemia unknown last hemoglobin, arthritis, diabetes mellitus presents to hospital secondary to nausea and vomiting blood.  1. Upper GI bleed- no use of NSAIDS, no recent gastroenteritis. -Known history of iron deficiency anemia -Continue nothing by mouth status, GI consulted for possible EGD. -on IV Protonix.  2. Iron deficiency anemia-anemia panel is pending. No baseline hemoglobin available. -GI consulted. Never had a colonoscopy. Will need both EGD and colonoscopy. -Nothing by mouth for now. -1 unit packed RBC being ordered. Hemoglobin check every 8 hours  3. Diabetes mellitus-check A1c, on metformin  4. DVT Prophylaxis- Teds and SCDs  Independent and ambulatory at baseline    All the records are reviewed and case discussed with ED provider. Management plans discussed with the patient, family and they are in agreement.  CODE STATUS: Full Code  TOTAL TIME TAKING CARE OF THIS PATIENT: 55 minutes.    Vilas Edgerly M.D on 02/09/2018 at 12:16 PM  Between 7am to 6pm - Pager - [phone]  After 6pm go to [email or web address] - password [email or web address] [facility]  Office  [phone]  CC: Primary care physician; Lorelee Market, MD

## 2018-02-09 NOTE — ED Provider Notes (Signed)
[facility] Emergency Department Provider Note       Time seen: ----------------------------------------- 8:48 AM on 02/09/2018 -----------------------------------------   I have reviewed the triage vital signs and the nursing notes.  HISTORY   Chief Complaint GI Bleeding    HPI Crystal [name] is a 67 y.o. female with a history of anemia, arthritis, bronchitis, diabetes who presents to the ED for hematemesis since 2 AM this morning.  Patient reports 4 episodes of vomiting with increasing amounts of redness in the vomiting.  Patient states last episode she saw blood clots present as well.  Patient states she does not have a history of this, does not have a history of acid reflux, does not drink alcohol, no NSAID intake or other complaints.  Past Medical History:  Diagnosis Date  . Anemia    TAKES IRON TAB  . Arthritis    SHOULDER  . Bronchitis    HX OF  . Diabetes mellitus without complication ([place])    TYPE 2  . Full dentures    UPPER AND LOWER    There are no active problems to display for this patient.   Past Surgical History:  Procedure Laterality Date  . CATARACT EXTRACTION W/PHACO Right 02/06/2018   Procedure: CATARACT EXTRACTION PHACO AND INTRAOCULAR LENS PLACEMENT ([place]) RIGHT DIABETIC;  Surgeon: Leandrew Koyanagi, MD;  Location: [facility];  Service: Ophthalmology;  Laterality: Right;  DIABETIC, ORAL MED  . DENTAL SURGERY     EXTRACTIONS    Allergies Patient has no known allergies.  Social History Social History   Tobacco Use  . Smoking status: Never Smoker  . Smokeless tobacco: Never Used  Substance Use Topics  . Alcohol use: No    Frequency: Never  . Drug use: Not on file   Review of Systems Constitutional: Negative for fever. Cardiovascular: Negative for chest pain. Respiratory: Negative for shortness of breath. Gastrointestinal: Negative for abdominal pain, positive for hematemesis Musculoskeletal:  Negative for back pain. Skin: Negative for rash. Neurological: Negative for headaches, focal weakness or numbness.  All systems negative/normal/unremarkable except as stated in the HPI  ____________________________________________   PHYSICAL EXAM:  VITAL SIGNS: ED Triage Vitals  Enc Vitals Group     BP 02/09/18 0820 121/61     Pulse Rate 02/09/18 0820 (!) 104     Resp 02/09/18 0820 16     Temp 02/09/18 0820 98.8 F (37.1 C)     Temp Source 02/09/18 0820 Oral     SpO2 02/09/18 0820 96 %     Weight 02/09/18 0817 194 lb (88 kg)     Height 02/09/18 0817 5' 3" (1.6 m)     Head Circumference --      Peak Flow --      Pain Score 02/09/18 0817 0     Pain Loc --      Pain Edu? --      Excl. in [place]? --    Constitutional: Alert and oriented. Well appearing and in no distress. Eyes: Conjunctivae are normal. Normal extraocular movements. ENT   Head: Normocephalic and atraumatic.   Nose: No congestion/rhinnorhea.   Mouth/Throat: Mucous membranes are moist.   Neck: No stridor. Cardiovascular: Normal rate, regular rhythm. No murmurs, rubs, or gallops. Respiratory: Normal respiratory effort without tachypnea nor retractions. Breath sounds are clear and equal bilaterally. No wheezes/rales/rhonchi. Gastrointestinal: Soft and nontender. Normal bowel sounds Musculoskeletal: Nontender with normal range of motion in extremities. No lower extremity tenderness nor edema. Neurologic:  Normal  speech and language. No gross focal neurologic deficits are appreciated.  Skin:  Skin is warm, dry and intact. No rash noted. Psychiatric: Mood and affect are normal. Speech and behavior are normal.  ____________________________________________  ED COURSE:  As part of my medical decision making, I reviewed the following data within the [facility] History obtained from family if available, nursing notes, old chart and ekg, as well as notes from prior ED visits. Patient presented  for hematemesis, we will assess with labs and imaging as indicated at this time.   Procedures ____________________________________________  CRITICAL CARE Performed by: Laurence Aly   Total critical care time: 30 minutes  Critical care time was exclusive of separately billable procedures and treating other patients.  Critical care was necessary to treat or prevent imminent or life-threatening deterioration.  Critical care was time spent personally by me on the following activities: development of treatment plan with patient and/or surrogate as well as nursing, discussions with consultants, evaluation of patient's response to treatment, examination of patient, obtaining history from patient or surrogate, ordering and performing treatments and interventions, ordering and review of laboratory studies, ordering and review of radiographic studies, pulse oximetry and re-evaluation of patient's condition.  LABS (pertinent positives/negatives)  Labs Reviewed  COMPREHENSIVE METABOLIC PANEL - Abnormal; Notable for the following components:      Result Value   CO2 21 (*)    Glucose, Bld 187 (*)    Calcium 8.4 (*)    Albumin 2.7 (*)    Alkaline Phosphatase 162 (*)    All other components within normal limits  CBC - Abnormal; Notable for the following components:   RBC 3.52 (*)    Hemoglobin 6.8 (*)    HCT 22.5 (*)    MCV 63.9 (*)    MCH 19.3 (*)    MCHC 30.1 (*)    RDW 20.3 (*)    All other components within normal limits  POC OCCULT BLOOD, ED  TYPE AND SCREEN  PREPARE RBC (CROSSMATCH)  ABO/RH  ____________________________________________  DIFFERENTIAL DIAGNOSIS   Mallory-Weiss tear, gastritis, peptic ulcer disease, esophageal varices, gastric cancer  FINAL ASSESSMENT AND PLAN  Hematemesis, anemia secondary to GI bleeding   Plan: The patient had presented for vomiting blood but seems otherwise asymptomatic. Patient's labs did reveal some anemia with a hemoglobin of 6.8.   We have ordered a blood transfusion of 2 units of packed red blood cells.  She is asymptomatic and less she is standing or walking around.  No clear etiology for her symptoms, she has never had anything like this before and does not have symptoms of acid reflux or alcohol abuse in her history.  We did give IV Protonix and octreotide as a preventative measure.  She would likely need an endoscopy, I will discussed with the hospitalist for admission.   Laurence Aly, MD   Note: This note was generated in part or whole with voice recognition software. Voice recognition is usually quite accurate but there are transcription errors that can and very often do occur. I apologize for any typographical errors that were not detected and corrected.     [name], MD 02/09/18 [phone]

## 2018-02-09 NOTE — Consult Note (Addendum)
Jonathon Bellows , MD [address], [place], [place], [place], [address] [address], [place], [place], [place], [address] Phone: [phone]  Fax: [phone]  Consultation  Referring Provider:  Dr Tressia Miners Primary Care Physician:  Lorelee Market, MD Primary Gastroenterologist:  None      Reason for Consultation:     GI bleed   Date of Admission:  02/09/2018 Date of Consultation:  02/09/2018         HPI:   Varonica Siharath Starks is a 67 y.o. female presented to the emergency room with throwing up of bright red blood which began early this morning.  Dr. Tressia Miners  called me earlier today, we do not have old labs to compare with but hemoglobin from 830 this morning shows that she had a hemoglobin of 6.8 and an MCV of 63.9  She says that yesterday she had a few episodes of hematemesis yesterday , non since, no similar episodes in the past . Denies use of NSAID's, bowel movement last at 4 am and was brown in color. Denies any rectal, vaginal or nasal bleeding.  Past Medical History:  Diagnosis Date  . Anemia    TAKES IRON TAB  . Arthritis    SHOULDER  . Bronchitis    HX OF  . Diabetes mellitus without complication ([place])    TYPE 2  . Full dentures    UPPER AND LOWER    Past Surgical History:  Procedure Laterality Date  . CATARACT EXTRACTION W/PHACO Right 02/06/2018   Procedure: CATARACT EXTRACTION PHACO AND INTRAOCULAR LENS PLACEMENT ([place] [facility]) RIGHT DIABETIC;  Surgeon: Leandrew Koyanagi, MD;  Location: [facility];  Service: Ophthalmology;  Laterality: Right;  DIABETIC, ORAL MED  . DENTAL SURGERY     EXTRACTIONS    Prior to Admission medications   Medication Sig Start Date End Date Taking? Authorizing Provider  diphenhydrAMINE (BENADRYL) 25 MG tablet Take 50 mg by mouth at bedtime as needed for itching, allergies or sleep.    Yes [provider]  ferrous sulfate 325 (65 FE) MG EC tablet Take 325 mg by mouth daily.    Yes [provider]    fluconazole (DIFLUCAN) 150 MG tablet Take 150 mg by mouth daily as needed (yeast).  01/03/18  Yes [provider]  metFORMIN (GLUCOPHAGE) 1000 MG tablet Take 1,000 mg by mouth 2 (two) times daily with a meal. AM AND PM   Yes [provider]    No family history on file.   Social History   Tobacco Use  . Smoking status: Never Smoker  . Smokeless tobacco: Never Used  Substance Use Topics  . Alcohol use: No    Frequency: Never  . Drug use: Not on file    Allergies as of 02/09/2018  . (No Known Allergies)    Review of Systems:    All systems reviewed and negative except where noted in HPI.   Physical Exam:  Vital signs in last 24 hours: Temp:  [98.5 F (36.9 C)-98.8 F (37.1 C)] 98.7 F (37.1 C) (03/30 1100) Pulse Rate:  [82-104] 82 (03/30 1100) Resp:  [15-17] 15 (03/30 1100) BP: (106-122)/(53-66) 109/57 (03/30 1100) SpO2:  [96 %-100 %] 98 % (03/30 1100) Weight:  [194 lb (88 kg)] 194 lb (88 kg) (03/30 0817)   General:   Pleasant, cooperative in NAD Head:  Normocephalic and atraumatic. Eyes:   No icterus.   Conjunctiva pink. PERRLA. Ears:  Normal auditory acuity. Neck:  Supple; no masses or thyroidomegaly  Lungs: Respirations even and unlabored. Lungs clear to auscultation bilaterally.   No wheezes, crackles, or rhonchi.  Heart:  Regular rate and rhythm;  Without murmur, clicks, rubs or gallops Abdomen:  Soft, nondistended, nontender. Normal bowel sounds. No appreciable masses or hepatomegaly.  No rebound or guarding.  Neurologic:  Alert and oriented x3;  grossly normal neurologically. Skin:  Intact without significant lesions or rashes. Cervical Nodes:  No significant cervical adenopathy. Psych:  Alert and cooperative. Normal affect.  LAB RESULTS: Recent Labs    02/09/18 0827  WBC 9.5  HGB 6.8*  HCT 22.5*  PLT 305   BMET Recent Labs    02/09/18 0827  NA 138  K 4.0  CL 108  CO2 21*  GLUCOSE 187*  BUN 14  CREATININE 0.56  CALCIUM 8.4*    LFT Recent Labs    02/09/18 0827  PROT 6.8  ALBUMIN 2.7*  AST 30  ALT 17  ALKPHOS 162*  BILITOT 0.9   PT/INR No results for input(s): LABPROT, INR in the last 72 hours.  STUDIES: No results found.    Impression / Plan:   Meelah Tallo Silverthorn is a 67 y.o. y/o female presents to the emergency room with hematemesis which began earlier today.  We do not have old labs to compare with but her CBC from this morning much fits a severe microcytosis of 63.9 and hemoglobin of 6.8.  This very likely suggests the back to processes going on one is a chronic microcytic anemia and in addition to which she had acutely presented with an acute anemia.  I think it is important that we not only  address the acute cause of the anemia but also look into the chronic etiology of her anemia.  Plan 1.  IV PPI 2.  The plan for an EGD and colonoscopy tomorrow and if negative will also proceed with capsule study on Monday 3.  Check iron studies B12 folate, urine analysis, celiac serology.  If iron is low provide IV iron, she is receiving a unit of blood.     I have discussed alternative options, risks & benefits,  which include, but are not limited to, bleeding, infection, perforation,respiratory complication & drug reaction.  The patient agrees with this plan & written consent will be obtained.     Thank you for involving me in the care of this patient.      LOS: 0 days   Jonathon Bellows, MD  02/09/2018, 11:22 AM

## 2018-02-10 ENCOUNTER — Encounter: Payer: Self-pay | Admitting: *Deleted

## 2018-02-10 ENCOUNTER — Inpatient Hospital Stay: Payer: Medicare HMO | Admitting: Anesthesiology

## 2018-02-10 ENCOUNTER — Other Ambulatory Visit: Payer: Self-pay

## 2018-02-10 ENCOUNTER — Encounter
Admission: EM | Disposition: A | Discharge status: Home / Self Care | Payer: Self-pay | Source: Home / Self Care | Attending: Internal Medicine

## 2018-02-10 DIAGNOSIS — I8501 Esophageal varices with bleeding: Principal | ICD-10-CM

## 2018-02-10 DIAGNOSIS — K920 Hematemesis: Secondary | ICD-10-CM

## 2018-02-10 HISTORY — PX: ESOPHAGOGASTRODUODENOSCOPY (EGD) WITH PROPOFOL: SHX5813

## 2018-02-10 LAB — GLUCOSE, CAPILLARY
Glucose-Capillary: 130 mg/dL — ABNORMAL HIGH (ref 65–99)
Glucose-Capillary: 158 mg/dL — ABNORMAL HIGH (ref 65–99)
Glucose-Capillary: 149 mg/dL — ABNORMAL HIGH (ref 65–99)
Glucose-Capillary: 117 mg/dL — ABNORMAL HIGH (ref 65–99)
Glucose-Capillary: 152 mg/dL — ABNORMAL HIGH (ref 65–99)

## 2018-02-10 LAB — BASIC METABOLIC PANEL
Anion gap: 8 (ref 5–15)
BUN: 11 mg/dL (ref 6–20)
CO2: 21 mmol/L — ABNORMAL LOW (ref 22–32)
Calcium: 7.8 mg/dL — ABNORMAL LOW (ref 8.9–10.3)
Chloride: 110 mmol/L (ref 101–111)
Creatinine, Ser: 0.48 mg/dL (ref 0.44–1.00)
GFR calc Af Amer: >60 mL/min (ref >60)
GFR calc non Af Amer: >60 mL/min (ref >60)
Glucose, Bld: 136 mg/dL — ABNORMAL HIGH (ref 65–99)
Potassium: 3.6 mmol/L (ref 3.5–5.1)
Sodium: 139 mmol/L (ref 135–145)

## 2018-02-10 LAB — HEMOGLOBIN
Hemoglobin: 7 g/dL — ABNORMAL LOW (ref 12.0–16.0)
Hemoglobin: 8.6 g/dL — ABNORMAL LOW (ref 12.0–16.0)

## 2018-02-10 LAB — CBC
HCT: 21 % — ABNORMAL LOW (ref 35.0–47.0)
Hemoglobin: 6.6 g/dL — ABNORMAL LOW (ref 12.0–16.0)
MCH: 21.1 pg — ABNORMAL LOW (ref 26.0–34.0)
MCHC: 31.7 g/dL — ABNORMAL LOW (ref 32.0–36.0)
MCV: 66.7 fL — ABNORMAL LOW (ref 80.0–100.0)
Platelets: 188 10*3/uL (ref 150–440)
RBC: 3.14 MIL/uL — ABNORMAL LOW (ref 3.80–5.20)
RDW: 23.6 % — ABNORMAL HIGH (ref 11.5–14.5)
WBC: 4 10*3/uL (ref 3.6–11.0)

## 2018-02-10 LAB — HIV ANTIBODY (ROUTINE TESTING W REFLEX): HIV Screen 4th Generation wRfx: NONREACTIVE

## 2018-02-10 SURGERY — ESOPHAGOGASTRODUODENOSCOPY (EGD) WITH PROPOFOL
Anesthesia: General

## 2018-02-10 MED ORDER — SODIUM CHLORIDE 0.9 % IV SOLN
1.0000 g | INTRAVENOUS | Status: DC
  Administered 2018-02-10 – 2018-02-11 (×2): 1 g via INTRAVENOUS
  Filled 2018-02-10 (×4): qty 10

## 2018-02-10 MED ORDER — PROPOFOL 10 MG/ML IV BOLUS
INTRAVENOUS | Status: DC | PRN
  Administered 2018-02-10: 20 mg via INTRAVENOUS
  Administered 2018-02-10: 70 mg via INTRAVENOUS
  Administered 2018-02-10: 10 mg via INTRAVENOUS

## 2018-02-10 MED ORDER — ONDANSETRON HCL 4 MG/2ML IJ SOLN
INTRAMUSCULAR | Status: AC
  Filled 2018-02-10: qty 2

## 2018-02-10 MED ORDER — MORPHINE SULFATE (PF) 2 MG/ML IV SOLN
1.0000 mg | Freq: Four times a day (QID) | INTRAVENOUS | Status: DC | PRN
  Administered 2018-02-10: 10:00:00 1 mg via INTRAVENOUS
  Filled 2018-02-10: qty 1

## 2018-02-10 MED ORDER — PROPOFOL 500 MG/50ML IV EMUL
INTRAVENOUS | Status: AC
  Filled 2018-02-10: qty 50

## 2018-02-10 MED ORDER — DEXTROSE-NACL 5-0.45 % IV SOLN
INTRAVENOUS | Status: DC
  Administered 2018-02-10 – 2018-02-11 (×2): via INTRAVENOUS

## 2018-02-10 MED ORDER — PROPOFOL 500 MG/50ML IV EMUL
INTRAVENOUS | Status: DC | PRN
  Administered 2018-02-10: 150 ug/kg/min via INTRAVENOUS

## 2018-02-10 MED ORDER — FENTANYL CITRATE (PF) 100 MCG/2ML IJ SOLN
25.0000 ug | Freq: Once | INTRAMUSCULAR | Status: AC
  Administered 2018-02-10: 25 ug via INTRAVENOUS

## 2018-02-10 MED ORDER — SODIUM CHLORIDE 0.9 % IV SOLN
Freq: Once | INTRAVENOUS | Status: AC
  Administered 2018-02-10: 10 mL via INTRAVENOUS

## 2018-02-10 MED ORDER — MORPHINE SULFATE (PF) 2 MG/ML IV SOLN
2.0000 mg | Freq: Once | INTRAVENOUS | Status: AC
  Administered 2018-02-10: 12:00:00 2 mg via INTRAVENOUS
  Filled 2018-02-10: qty 1

## 2018-02-10 MED ORDER — FENTANYL CITRATE (PF) 100 MCG/2ML IJ SOLN
INTRAMUSCULAR | Status: AC
  Administered 2018-02-10: 25 ug via INTRAVENOUS
  Filled 2018-02-10: qty 2

## 2018-02-10 MED ORDER — MORPHINE SULFATE (PF) 2 MG/ML IV SOLN
2.0000 mg | Freq: Once | INTRAVENOUS | Status: AC
  Administered 2018-02-10: 10:00:00 2 mg via INTRAVENOUS

## 2018-02-10 MED ORDER — MORPHINE SULFATE (PF) 2 MG/ML IV SOLN
2.0000 mg | INTRAVENOUS | Status: DC | PRN
  Administered 2018-02-10 – 2018-02-11 (×4): 2 mg via INTRAVENOUS
  Filled 2018-02-10 (×3): qty 1

## 2018-02-10 MED ORDER — VITAMIN B-12 1000 MCG PO TABS
1000.0000 ug | ORAL_TABLET | Freq: Every day | ORAL | Status: DC
  Administered 2018-02-11 – 2018-02-12 (×2): 1000 ug via ORAL
  Filled 2018-02-10 (×2): qty 1

## 2018-02-10 MED ORDER — MORPHINE SULFATE (PF) 2 MG/ML IV SOLN
2.0000 mg | INTRAVENOUS | Status: DC | PRN
  Filled 2018-02-10: qty 1

## 2018-02-10 NOTE — Progress Notes (Signed)
Patient Crystal [name] is now 7. Spoke to Dr. Lucky Rathke who will repeat Crystal [name].

## 2018-02-10 NOTE — Anesthesia Post-op Follow-up Note (Signed)
Anesthesia QCDR form completed.

## 2018-02-10 NOTE — Anesthesia Postprocedure Evaluation (Signed)
Anesthesia Post Note  Patient: Crystal [name]  Procedure(s) Performed: ESOPHAGOGASTRODUODENOSCOPY (EGD) WITH PROPOFOL (N/A )  Patient location during evaluation: Endoscopy Anesthesia Type: General Level of consciousness: awake and alert Pain management: pain level controlled Vital Signs Assessment: post-procedure vital signs reviewed and stable Respiratory status: spontaneous breathing, nonlabored ventilation, respiratory function stable and patient connected to nasal cannula oxygen Cardiovascular status: blood pressure returned to baseline and stable Postop Assessment: no apparent nausea or vomiting Anesthetic complications: no     Last Vitals:  Vitals:   02/10/18 1225 02/10/18 1550  BP: (!) 160/71 (!) 151/65  Pulse: 76 70  Resp: 16 16  Temp: 37 C 36.5 C  SpO2: 98% 95%    Last Pain:  Vitals:   02/10/18 1550  TempSrc: Oral  PainSc:                  Azul Coffie S

## 2018-02-10 NOTE — Transfer of Care (Signed)
Immediate Anesthesia Transfer of Care Note  Patient: Crystal [name]  Procedure(s) Performed: ESOPHAGOGASTRODUODENOSCOPY (EGD) WITH PROPOFOL (N/A )  Patient Location: PACU  Anesthesia Type:General  Level of Consciousness: sedated  Airway & Oxygen Therapy: Patient Spontanous Breathing and Patient connected to nasal cannula oxygen  Post-op Assessment: Report given to RN and Post -op Vital signs reviewed and stable  Post vital signs: Reviewed and stable  Last Vitals:  Vitals Value Taken Time  BP    Temp    Pulse    Resp    SpO2      Last Pain:  Vitals:   02/10/18 0418  TempSrc: Oral  PainSc:          Complications: No apparent anesthesia complications

## 2018-02-10 NOTE — Progress Notes (Signed)
Spoke to MD Salary about Hg. MD recommend to recheck in 6 hours.    Patient is asymptomatic and all vital signs are WNL. Will Continue to monitor and endorse.

## 2018-02-10 NOTE — Plan of Care (Signed)
  Problem: Education: Goal: Knowledge of General Education information will improve Outcome: Progressing   Problem: Education: Goal: Ability to identify signs and symptoms of gastrointestinal bleeding will improve Outcome: Progressing   Problem: Bowel/Gastric: Goal: Will show no signs and symptoms of gastrointestinal bleeding Outcome: Progressing   Problem: Fluid Volume: Goal: Will show no signs and symptoms of excessive bleeding Outcome: Progressing   Problem: Clinical Measurements: Goal: Complications related to the disease process, condition or treatment will be avoided or minimized Outcome: Progressing

## 2018-02-10 NOTE — Progress Notes (Signed)
Notified Dr. Vicente Males of patient stating pain 10/10 pain in abdomen, received verbal order for 87m morphine one time dose.

## 2018-02-10 NOTE — Plan of Care (Signed)
  Problem: Education: Goal: Knowledge of General Education information will improve 02/10/2018 2004 by Herbie Baltimore, RN Outcome: Progressing 02/10/2018 2003 by Herbie Baltimore, RN Outcome: Progressing   Problem: Education: Goal: Ability to identify signs and symptoms of gastrointestinal bleeding will improve 02/10/2018 2004 by Herbie Baltimore, RN Outcome: Progressing 02/10/2018 2003 by Herbie Baltimore, RN Outcome: Progressing   Problem: Bowel/Gastric: Goal: Will show no signs and symptoms of gastrointestinal bleeding 02/10/2018 2004 by Herbie Baltimore, RN Outcome: Progressing 02/10/2018 2003 by Herbie Baltimore, RN Outcome: Progressing   Problem: Fluid Volume: Goal: Will show no signs and symptoms of excessive bleeding 02/10/2018 2004 by Herbie Baltimore, RN Outcome: Progressing 02/10/2018 2003 by Herbie Baltimore, RN Outcome: Progressing   Problem: Fluid Volume: Goal: Will show no signs and symptoms of excessive bleeding 02/10/2018 2004 by Herbie Baltimore, RN Outcome: Progressing 02/10/2018 2003 by Herbie Baltimore, RN Outcome: Progressing   Problem: Clinical Measurements: Goal: Complications related to the disease process, condition or treatment will be avoided or minimized 02/10/2018 2004 by Herbie Baltimore, RN Outcome: Progressing 02/10/2018 2003 by Herbie Baltimore, RN Outcome: Progressing

## 2018-02-10 NOTE — H&P (Signed)
Crystal Bellows, MD [address], [place], [place], [place], [id number] [address] [place], [place], [place], [address] Phone: [phone]  Fax: [phone]  Primary Care Physician:  Lorelee Market, MD   Pre-Procedure History & Physical: HPI:  Crystal [name] is a 67 y.o. female is here for an endoscopy    Past Medical History:  Diagnosis Date  . Anemia    TAKES IRON TAB  . Arthritis    SHOULDER  . Bronchitis    HX OF  . Diabetes mellitus without complication ([place])    TYPE 2  . Full dentures    UPPER AND LOWER    Past Surgical History:  Procedure Laterality Date  . CATARACT EXTRACTION W/PHACO Right 02/06/2018   Procedure: CATARACT EXTRACTION PHACO AND INTRAOCULAR LENS PLACEMENT ([place]) RIGHT DIABETIC;  Surgeon: Leandrew Koyanagi, MD;  Location: [facility];  Service: Ophthalmology;  Laterality: Right;  DIABETIC, ORAL MED  . DENTAL SURGERY     EXTRACTIONS    Prior to Admission medications   Medication Sig Start Date End Date Taking? Authorizing Provider  diphenhydrAMINE (BENADRYL) 25 MG tablet Take 50 mg by mouth at bedtime as needed for itching, allergies or sleep.    Yes [provider]  ferrous sulfate 325 (65 FE) MG EC tablet Take 325 mg by mouth daily.    Yes [provider]  fluconazole (DIFLUCAN) 150 MG tablet Take 150 mg by mouth daily as needed (yeast).  01/03/18  Yes [provider]  metFORMIN (GLUCOPHAGE) 1000 MG tablet Take 1,000 mg by mouth 2 (two) times daily with a meal. AM AND PM   Yes [provider]    Allergies as of 02/09/2018  . (No Known Allergies)    Family History  Problem Relation Age of Onset  . CAD Father     Social History   Socioeconomic History  . Marital status: Single    Spouse name: Not on file  . Number of children: Not on file  . Years of education: Not on file  . Highest education level: Not on file  Occupational History  . Not on file  Social Needs  . Financial  resource strain: Not on file  . Food insecurity:    Worry: Not on file    Inability: Not on file  . Transportation needs:    Medical: Not on file    Non-medical: Not on file  Tobacco Use  . Smoking status: Never Smoker  . Smokeless tobacco: Never Used  Substance and Sexual Activity  . Alcohol use: No    Frequency: Never  . Drug use: Not on file  . Sexual activity: Not on file  Lifestyle  . Physical activity:    Days per week: Not on file    Minutes per session: Not on file  . Stress: Not on file  Relationships  . Social connections:    Talks on phone: Not on file    Gets together: Not on file    Attends religious service: Not on file    Active member of club or organization: Not on file    Attends meetings of clubs or organizations: Not on file    Relationship status: Not on file  . Intimate partner violence:    Fear of current or ex partner: Not on file    Emotionally abused: Not on file    Physically abused: Not on file    Forced sexual activity: Not on file  Other Topics  Concern  . Not on file  Social History Narrative   Independent at baseline. Lives at home with family    Review of Systems: See HPI, otherwise negative ROS  Physical Exam: BP 127/60 (BP Location: Left Arm)   Pulse 76   Temp 98.3 F (36.8 C) (Oral)   Resp 18   Ht 5' 3" (1.6 m)   Wt 194 lb (88 kg)   SpO2 97%   BMI 34.37 kg/m  General:   Alert,  pleasant and cooperative in NAD Head:  Normocephalic and atraumatic. Neck:  Supple; no masses or thyromegaly. Lungs:  Clear throughout to auscultation, normal respiratory effort.    Heart:  +S1, +S2, Regular rate and rhythm, No edema. Abdomen:  Soft, nontender and nondistended. Normal bowel sounds, without guarding, and without rebound.   Neurologic:  Alert and  oriented x4;  grossly normal neurologically.  Impression/Plan: Crystal [name] is here for an endoscopy  to be performed for  evaluation of hematemesis and melena    Risks, benefits,  limitations, and alternatives regarding endoscopy have been reviewed with the patient.  Questions have been answered.  All parties agreeable.   Crystal Bellows, MD  02/10/2018, 8:09 AM

## 2018-02-10 NOTE — Progress Notes (Signed)
Endo called. Report given to [facility]. Consent signed.

## 2018-02-10 NOTE — Plan of Care (Signed)
Patient continues with dark red bloody stool.

## 2018-02-10 NOTE — Op Note (Signed)
[facility] Gastroenterology Patient Name: Crystal [name] Procedure Date: 02/10/2018 8:10 AM MRN: [id number] Account #: [id number] Date of Birth: [birth year] Admit Type: Inpatient Age: 67 Room: [facility] ENDO ROOM 4 Gender: Female Note Status: Finalized Procedure:            Upper GI endoscopy Indications:          Hematemesis Providers:            Jonathon Bellows MD, MD Referring MD:         Lorelee Market (Referring MD) Medicines:            Monitored Anesthesia Care Complications:        No immediate complications. Procedure:            Pre-Anesthesia Assessment:                       - Prior to the procedure, a History and Physical was                        performed, and patient medications, allergies and                        sensitivities were reviewed. The patient's tolerance of                        previous anesthesia was reviewed.                       - The risks and benefits of the procedure and the                        sedation options and risks were discussed with the                        patient. All questions were answered and informed                        consent was obtained.                       - ASA Grade Assessment: III - A patient with severe                        systemic disease.                       After obtaining informed consent, the endoscope was                        passed under direct vision. Throughout the procedure,                        the patient's blood pressure, pulse, and oxygen                        saturations were monitored continuously. The Endoscope                        was introduced through the mouth, and advanced to the  third part of duodenum. The upper GI endoscopy was                        accomplished with ease. The patient tolerated the                        procedure well. Findings:      The examined duodenum was normal.      The stomach was normal.      Five columns of  non-bleeding grade III varices were found in the lower       third of the esophagus,. Stigmata of recent bleeding were evident and       red wale signs were present. Ten bands were successfully placed with       incomplete eradication of varices. There was no bleeding during, and at       the end, of the procedure.      The exam was otherwise without abnormality. Impression:           - Normal examined duodenum.                       - Normal stomach.                       - Recently bleeding grade III esophageal varices.                        Incompletely eradicated. Banded.                       - The examination was otherwise normal.                       - No specimens collected. Recommendation:       - Return patient to hospital ward for ongoing care.                       - NPO for 1 day.                       - 1. Continue octreotide for 72 hours                       2. Commence on antibiotics for prophylaxsis as she has                        cirrhosis                       3. Commence on nadolol 20 mg once a day close to                        discharge for secondary prophylaxsis                       4. If shows further signs of bleeding transfer to [facility]                        cone for TIPS                       5. She had very large varices with multiple red  whale                        signs indicating signs of recent bleeding .                       6. Repeat EGD in 3-4 weeks time for further banding Procedure Code(s):    --- Professional ---                       [phone], Esophagogastroduodenoscopy, flexible, transoral;                        with band ligation of esophageal/gastric varices Diagnosis Code(s):    --- Professional ---                       I85.01, Esophageal varices with bleeding                       K92.0, Hematemesis CPT copyright 2016 American Medical Association. All rights reserved. The codes documented in this report are preliminary and upon coder  review may  be revised to meet current compliance requirements. Jonathon Bellows, MD Jonathon Bellows MD, MD 02/10/2018 8:38:19 AM This report has been signed electronically. Number of Addenda: 0 Note Initiated On: 02/10/2018 8:10 AM      [facility]

## 2018-02-10 NOTE — Anesthesia Preprocedure Evaluation (Signed)
Anesthesia Evaluation  Patient identified by MRN, date of birth, ID band Patient awake    Reviewed: Allergy & Precautions, NPO status , Patient's Chart, lab work & pertinent test results, reviewed documented beta blocker date and time   Airway Mallampati: II  TM Distance: >3 FB     Dental  (+) Chipped   Pulmonary           Cardiovascular      Neuro/Psych    GI/Hepatic   Endo/Other  diabetes, Type 2  Renal/GU      Musculoskeletal  (+) Arthritis ,   Abdominal   Peds  Hematology  (+) anemia ,   Anesthesia Other Findings Severe anmia. Hb this am 6.6.  Reproductive/Obstetrics                             Anesthesia Physical Anesthesia Plan  ASA: III  Anesthesia Plan: General   Post-op Pain Management:    Induction: Intravenous  PONV Risk Score and Plan:   Airway Management Planned:   Additional Equipment:   Intra-op Plan:   Post-operative Plan:   Informed Consent: I have reviewed the patients History and Physical, chart, labs and discussed the procedure including the risks, benefits and alternatives for the proposed anesthesia with the patient or authorized representative who has indicated his/her understanding and acceptance.     Plan Discussed with: CRNA  Anesthesia Plan Comments:         Anesthesia Quick Evaluation

## 2018-02-10 NOTE — Progress Notes (Signed)
Patient vomited large amount of mucus like emesis, no blood noted. Spoke with Dr. Vicente Males, physician stated he was only concerned about bloody emesis. Patient's hemoglobin up to 8.6 and unit of blood given. No bloody stool noted.

## 2018-02-10 NOTE — Progress Notes (Signed)
Discussed patient's discomfort/ complaints of abdominal pain with Dr. Benjie Karvonen, received verbal order for morphine 57m prn every 2 hours for severe pain.

## 2018-02-10 NOTE — Plan of Care (Signed)
  Problem: Education: Goal: Knowledge of General Education information will improve 02/10/2018 2024 by Herbie Baltimore, RN Outcome: Progressing 02/10/2018 2004 by Herbie Baltimore, RN Outcome: Progressing 02/10/2018 2003 by Herbie Baltimore, RN Outcome: Progressing   Problem: Education: Goal: Ability to identify signs and symptoms of gastrointestinal bleeding will improve 02/10/2018 2024 by Herbie Baltimore, RN Outcome: Progressing 02/10/2018 2004 by Herbie Baltimore, RN Outcome: Progressing 02/10/2018 2003 by Herbie Baltimore, RN Outcome: Progressing   Problem: Bowel/Gastric: Goal: Will show no signs and symptoms of gastrointestinal bleeding 02/10/2018 2024 by Herbie Baltimore, RN Outcome: Progressing 02/10/2018 2004 by Herbie Baltimore, RN Outcome: Progressing 02/10/2018 2003 by Herbie Baltimore, RN Outcome: Progressing   Problem: Fluid Volume: Goal: Will show no signs and symptoms of excessive bleeding 02/10/2018 2024 by Herbie Baltimore, RN Outcome: Progressing 02/10/2018 2004 by Herbie Baltimore, RN Outcome: Progressing 02/10/2018 2003 by Herbie Baltimore, RN Outcome: Progressing   Problem: Clinical Measurements: Goal: Complications related to the disease process, condition or treatment will be avoided or minimized 02/10/2018 2024 by Herbie Baltimore, RN Outcome: Progressing 02/10/2018 2004 by Herbie Baltimore, RN Outcome: Progressing 02/10/2018 2003 by Herbie Baltimore, RN Outcome: Progressing

## 2018-02-10 NOTE — Progress Notes (Signed)
[facility] [place] NAME: Crystal [name]    MR#:  [id number]  DATE OF BIRTH:  02-Sep-1951  SUBJECTIVE:   Patient had EGD this am with banding.  Complaining of abdominal pain  REVIEW OF SYSTEMS:    Review of Systems  Constitutional: Negative for fever, chills weight loss HENT: Negative for ear pain, nosebleeds, congestion, facial swelling, rhinorrhea, neck pain, neck stiffness and ear discharge.   Respiratory: Negative for cough, shortness of breath, wheezing  Cardiovascular: Negative for chest pain, palpitations and leg swelling.  Gastrointestinal: Negative for heartburn, abdominal pain, vomiting, diarrhea or consitpation Positive abdominal pain from banding Genitourinary: Negative for dysuria, urgency, frequency, hematuria Musculoskeletal: Negative for back pain or joint pain Neurological: Negative for dizziness, seizures, syncope, focal weakness,  numbness and headaches.  Hematological: Does not bruise/bleed easily.  Psychiatric/Behavioral: Negative for hallucinations, confusion, dysphoric mood    Tolerating Diet: npo      DRUG ALLERGIES:  No Known Allergies  VITALS:  Blood pressure (!) 158/73, pulse 79, temperature 98.6 F (37 C), temperature source Oral, resp. rate 16, height 5' 3" (1.6 m), weight 88 kg (194 lb), SpO2 98 %.  PHYSICAL EXAMINATION:  Constitutional: Appears well-developed and well-nourished. No distress. HENT: Normocephalic. Marland Kitchen Oropharynx is clear and moist.  Eyes: Conjunctivae and EOM are normal. PERRLA, no scleral icterus.  Neck: Normal ROM. Neck supple. No JVD. No tracheal deviation. CVS: RRR, S1/S2 +, no murmurs, no gallops, no carotid bruit.  Pulmonary: Effort and breath sounds normal, no stridor, rhonchi, wheezes, rales.  Abdominal: Soft. BS +,  no distension, tenderness, rebound or guarding.  Musculoskeletal: Normal range of motion. No edema and no tenderness.  Neuro: Alert. CN 2-12 grossly intact. No focal  deficits. Skin: Skin is warm and dry. No rash noted. Psychiatric: Normal mood and affect.      LABORATORY PANEL:   CBC Recent Labs  Lab 02/10/18 0416  WBC 4.0  HGB 6.6*  HCT 21.0*  PLT 188   ------------------------------------------------------------------------------------------------------------------  Chemistries  Recent Labs  Lab 02/09/18 0827 02/10/18 0416  NA 138 139  K 4.0 3.6  CL 108 110  CO2 21* 21*  GLUCOSE 187* 136*  BUN 14 11  CREATININE 0.56 0.48  CALCIUM 8.4* 7.8*  AST 30  --   ALT 17  --   ALKPHOS 162*  --   BILITOT 0.9  --    ------------------------------------------------------------------------------------------------------------------  Cardiac Enzymes No results for input(s): TROPONINI in the last 168 hours. ------------------------------------------------------------------------------------------------------------------  RADIOLOGY:  Nm Gi Blood Loss  Result Date: 02/09/2018 CLINICAL DATA:  Inpatient.  GI bleed.  Melena. EXAM: NUCLEAR MEDICINE GASTROINTESTINAL BLEEDING SCAN TECHNIQUE: Sequential abdominal images were obtained following intravenous administration of Tc-76mlabeled red blood cells. RADIOPHARMACEUTICALS:  20.9 mCi Tc-990mertechnetate in-vitro labeled red cells. COMPARISON:  02/09/2018 CT abdomen/pelvis. FINDINGS: No scintigraphic evidence of active gastrointestinal bleeding. There is tagged RBC uptake by the enlarged spleen. IMPRESSION: No scintigraphic evidence of active GI bleed. Electronically Signed   By: JaIlona Sorrel.D.   On: 02/09/2018 23:01   Ct Abdomen Pelvis W Contrast  Result Date: 02/09/2018 CLINICAL DATA:  Acute generalized abdominal pain.  Hematemesis. EXAM: CT ABDOMEN AND PELVIS WITH CONTRAST TECHNIQUE: Multidetector CT imaging of the abdomen and pelvis was performed using the standard protocol following bolus administration of intravenous contrast. CONTRAST:  10047mSOVUE-300 IOPAMIDOL (ISOVUE-300) INJECTION 61%  COMPARISON:  None. FINDINGS: Lower chest: Small left pleural effusion is noted. 18 mm spiculated density is  noted in right lower lobe concerning for malignancy. Hepatobiliary: Small gallstone is noted. Mildly nodular hepatic contours are noted concerning for hepatic cirrhosis. No biliary dilatation is noted. Pancreas: Unremarkable. No pancreatic ductal dilatation or surrounding inflammatory changes. Spleen: Moderate splenomegaly is noted. Adrenals/Urinary Tract: Adrenal glands are unremarkable. Kidneys are normal, without renal calculi, focal lesion, or hydronephrosis. Bladder is unremarkable. Stomach/Bowel: The stomach appears normal. There is no evidence of bowel dilatation. Wall thickening of several loops of small bowel seen in left lower quadrant are noted concerning for enteritis. Vascular/Lymphatic: No significant vascular abnormality is noted. Mildly enlarged retroperitoneal lymph nodes are noted with the largest measuring 11 mm, concerning for metastatic disease or malignancy. Reproductive: Uterus and right ovary are unremarkable. 7 cm left ovarian cyst is noted. Further evaluation with MRI is recommended to rule out malignancy. Other: Mild ascites is noted in the pelvis and both upper quadrants. Musculoskeletal: No acute or significant osseous findings. IMPRESSION: 18 mm spiculated density seen in right lower lobe concerning for malignancy. Chest CT with intravenous contrast is recommended for further evaluation. Mildly nodular hepatic contours are noted concerning for hepatic cirrhosis. Moderate splenomegaly is noted suggesting portal venous hypertension. Wall thickening of several loops of small bowel is seen in left lower quadrant concerning for enteritis. Mild ascites is noted. Mildly enlarged retroperitoneal lymph nodes are noted concerning for possible metastatic disease or malignancy. 7 mm left ovarian cyst is noted. Further evaluation with MRI is recommended to evaluate for possible neoplasm.  Electronically Signed   By: Marijo Conception, M.D.   On: 02/09/2018 17:38     ASSESSMENT AND PLAN:   67 year old female with no history of EtOH abuse who presented with hematemesis.  1.  Upper GI bleed: Patient underwent endoscopy which shows recent bleeding grade 3 esophageal varices which was banded. Continue octreotide for 72 hours and PPI  2.  Liver cirrhosis, new diagnosis likely Karlene Lineman without history of EtOH: Continue on antibiotics for prophylaxis due to cirrhosis and GI bleed Nadolol 20 mg daily upon discharge  3.  Acute blood loss anemia due to upper GI bleed with esophageal varices Status post 2 unit PRBC Patient will need 1 more unit of blood today CBC for a.m. Iron studies consistent with iron deficiency anemia and low B12  Start B12 supplement  Receiving iron sucrose  4.18 mm spiculated density seen in right lower lobe concerning for malignancy. Chest CT with intravenous contrast is recommended for further evaluation This may be done as an outpatient  5.  Diabetes: Continue sliding scale   Management plans discussed with the patient and family and they are in agreement.  CODE STATUS: FULL  TOTAL TIME TAKING CARE OF THIS PATIENT: 33 minutes.    D/w dr Vicente Males  POSSIBLE D/C 3 days, DEPENDING ON CLINICAL CONDITION.   Dragan Tamburrino M.D on 02/10/2018 at 11:58 AM  Between 7am to 6pm - Pager - [phone] After 6pm go to [email or web address] - password [email or web address] [facility]  Office  [phone]  CC: Primary care physician; Lorelee Market, MD  Note: This dictation was prepared with Dragon dictation along with smaller phrase technology. Any transcriptional errors that result from this process are unintentional.

## 2018-02-10 NOTE — Progress Notes (Signed)
Notified Dr. Vicente Males of patient's abdominal pain 10/10 score, advised to page Dr.  Benjie Karvonen, Dr. Vicente Males could not get in touch with Dr. Benjie Karvonen, received a one time order for morphine 38m.

## 2018-02-11 ENCOUNTER — Encounter: Payer: Self-pay | Admitting: Gastroenterology

## 2018-02-11 DIAGNOSIS — K922 Gastrointestinal hemorrhage, unspecified: Secondary | ICD-10-CM

## 2018-02-11 LAB — BASIC METABOLIC PANEL
Anion gap: 7 (ref 5–15)
BUN: <5 mg/dL — ABNORMAL LOW (ref 6–20)
CO2: 22 mmol/L (ref 22–32)
Calcium: 7.8 mg/dL — ABNORMAL LOW (ref 8.9–10.3)
Chloride: 108 mmol/L (ref 101–111)
Creatinine, Ser: 0.48 mg/dL (ref 0.44–1.00)
GFR calc Af Amer: >60 mL/min (ref >60)
GFR calc non Af Amer: >60 mL/min (ref >60)
Glucose, Bld: 181 mg/dL — ABNORMAL HIGH (ref 65–99)
Potassium: 3.1 mmol/L — ABNORMAL LOW (ref 3.5–5.1)
Sodium: 137 mmol/L (ref 135–145)

## 2018-02-11 LAB — PREPARE RBC (CROSSMATCH)

## 2018-02-11 LAB — CBC
HCT: 26.7 % — ABNORMAL LOW (ref 35.0–47.0)
Hemoglobin: 8.5 g/dL — ABNORMAL LOW (ref 12.0–16.0)
MCH: 22.2 pg — ABNORMAL LOW (ref 26.0–34.0)
MCHC: 31.8 g/dL — ABNORMAL LOW (ref 32.0–36.0)
MCV: 69.8 fL — ABNORMAL LOW (ref 80.0–100.0)
Platelets: 196 10*3/uL (ref 150–440)
RBC: 3.83 MIL/uL (ref 3.80–5.20)
RDW: 24.3 % — ABNORMAL HIGH (ref 11.5–14.5)
WBC: 6.1 10*3/uL (ref 3.6–11.0)

## 2018-02-11 LAB — MAGNESIUM: Magnesium: 1.7 mg/dL (ref 1.7–2.4)

## 2018-02-11 LAB — GLUCOSE, CAPILLARY
Glucose-Capillary: 166 mg/dL — ABNORMAL HIGH (ref 65–99)
Glucose-Capillary: 128 mg/dL — ABNORMAL HIGH (ref 65–99)
Glucose-Capillary: 111 mg/dL — ABNORMAL HIGH (ref 65–99)
Glucose-Capillary: 134 mg/dL — ABNORMAL HIGH (ref 65–99)

## 2018-02-11 MED ORDER — SENNA 8.6 MG PO TABS
1.0000 | ORAL_TABLET | Freq: Every day | ORAL | Status: DC
  Administered 2018-02-11 – 2018-02-12 (×2): 8.6 mg via ORAL
  Filled 2018-02-11 (×2): qty 1

## 2018-02-11 MED ORDER — LACTULOSE 10 GM/15ML PO SOLN
30.0000 g | Freq: Two times a day (BID) | ORAL | Status: DC
  Administered 2018-02-11 – 2018-02-12 (×3): 30 g via ORAL
  Filled 2018-02-11 (×3): qty 60

## 2018-02-11 MED ORDER — POTASSIUM CHLORIDE 10 MEQ/100ML IV SOLN
INTRAVENOUS | Status: AC
  Filled 2018-02-11: qty 100

## 2018-02-11 MED ORDER — MAGNESIUM SULFATE 2 GM/50ML IV SOLN
2.0000 g | Freq: Once | INTRAVENOUS | Status: AC
Start: 2018-02-11 — End: 2018-02-11
  Administered 2018-02-11: 2 g via INTRAVENOUS
  Filled 2018-02-11: qty 50

## 2018-02-11 MED ORDER — POTASSIUM CHLORIDE 10 MEQ/100ML IV SOLN
10.0000 meq | INTRAVENOUS | Status: AC
  Administered 2018-02-11 (×4): 10 meq via INTRAVENOUS
  Filled 2018-02-11 (×4): qty 100

## 2018-02-11 MED ORDER — ONDANSETRON HCL 4 MG/2ML IJ SOLN: 4.0000 mg | Freq: Four times a day (QID) | INTRAMUSCULAR | Status: DC | PRN

## 2018-02-11 NOTE — Progress Notes (Signed)
MEDICATION RELATED CONSULT NOTE - INITIAL   Pharmacy Consult for electrolyte monitoring/replacement Indication: hypokalemia  No Known Allergies  Patient Measurements: Height: 5' 3" (160 cm) Weight: 194 lb (88 kg) IBW/kg (Calculated) : 52.4  Vital Signs: Temp: 98.1 F (36.7 C) (04/01 0427) Temp Source: Oral (04/01 0427) BP: 141/64 (04/01 0427) Pulse Rate: 66 (04/01 0427) Intake/Output from previous day: 03/31 0701 - 04/01 0700 In: 3055 [I.V.:2438; Blood:340; IV Piggyback:277] Out: -  Intake/Output from this shift: No intake/output data recorded.  Labs: Recent Labs    02/09/18 0827  02/10/18 0416 02/10/18 1700 02/11/18 0418  WBC 9.5  --  4.0  --  6.1  HGB 6.8*   < > 6.6* 8.6* 8.5*  HCT 22.5*  --  21.0*  --  26.7*  PLT 305  --  188  --  196  CREATININE 0.56  --  0.48  --  0.48  MG  --   --   --   --  1.7  ALBUMIN 2.7*  --   --   --   --   PROT 6.8  --   --   --   --   AST 30  --   --   --   --   ALT 17  --   --   --   --   ALKPHOS 162*  --   --   --   --   BILITOT 0.9  --   --   --   --    < > = values in this interval not displayed.   Estimated Creatinine Clearance: 73.7 mL/min (by C-G formula based on SCr of 0.48 mg/dL).  Assessment: Pharmacy consulted to assist with monitoring/replacement of electrolytes in this 67 year old female admitted with upper GIB. Patient is currently NPO.  K = 3.1 is low Mg = 1.7  Corrected Ca = 8.8 WNL  Goal of Therapy: Electrolytes WNL  Plan:  Magnesium 2 g IV once KCl 10 mEq IV x 4 runs  Will recheck electrolytes with AM labs tomorrow.  Lenis Noon, PharmD, BCPS Clinical Pharmacist 02/11/2018,9:35 AM

## 2018-02-11 NOTE — Progress Notes (Signed)
Lucilla Lame, MD [facility]   [address], [place], [place] [address] Phone: [phone] Fax : [phone]   Subjective: The patient denies any sign of any further bleeding.  She has had some nose bleeding but a small amount.  The patient had esophageal varices banded yesterday.  She reports that she has not had a bowel movement but her abdominal pain is much improved.  The patient denies ever being told she had cirrhosis in the past.   Objective: Vital signs in last 24 hours: Vitals:   02/11/18 0124 02/11/18 0427 02/11/18 1025 02/11/18 1317  BP: (!) 147/68 (!) 141/64 138/63 (!) 176/65  Pulse: 72 66 64 63  Resp: _0 Temp: 98.5 F (36.9 C) 98.1 F (36.7 C) 98.1 F (36.7 C) 98 F (36.7 C)  TempSrc: Oral Oral Oral Oral  SpO2: 96% 93% 99%   Weight:      Height:       Weight change:   Intake/Output Summary (Last 24 hours) at 02/11/2018 1536 Last data filed at 02/11/2018 0355 Gross per 24 hour  Intake 2755 ml  Output -  Net 2755 ml     Exam: Heart:: Regular rate and rhythm, S1S2 present or without murmur or extra heart sounds Lungs: normal and clear to auscultation and percussion Abdomen: soft, nontender, normal bowel sounds   Lab Results: _1 @ Micro Results: No results found for this or any previous visit (from the past 240 hour(s)). Studies/Results: Nm Gi Blood Loss  Result Date: 02/09/2018 CLINICAL DATA:  Inpatient.  GI bleed.  Melena. EXAM: NUCLEAR MEDICINE GASTROINTESTINAL BLEEDING SCAN TECHNIQUE: Sequential abdominal images were obtained following intravenous administration of Tc-2mlabeled red blood cells. RADIOPHARMACEUTICALS:  20.9 mCi Tc-973mertechnetate in-vitro labeled red cells. COMPARISON:  02/09/2018 CT abdomen/pelvis. FINDINGS: No scintigraphic evidence of active gastrointestinal bleeding. There is tagged RBC uptake by the enlarged spleen. IMPRESSION: No scintigraphic evidence of active GI bleed. Electronically Signed   By: JaIlona SorrelM.D.   On: 02/09/2018 23:01   Ct Abdomen Pelvis W Contrast  Result Date: 02/09/2018 CLINICAL DATA:  Acute generalized abdominal pain.  Hematemesis. EXAM: CT ABDOMEN AND PELVIS WITH CONTRAST TECHNIQUE: Multidetector CT imaging of the abdomen and pelvis was performed using the standard protocol following bolus administration of intravenous contrast. CONTRAST:  10063mSOVUE-300 IOPAMIDOL (ISOVUE-300) INJECTION 61% COMPARISON:  None. FINDINGS: Lower chest: Small left pleural effusion is noted. 18 mm spiculated density is noted in right lower lobe concerning for malignancy. Hepatobiliary: Small gallstone is noted. Mildly nodular hepatic contours are noted concerning for hepatic cirrhosis. No biliary dilatation is noted. Pancreas: Unremarkable. No pancreatic ductal dilatation or surrounding inflammatory changes. Spleen: Moderate splenomegaly is noted. Adrenals/Urinary Tract: Adrenal glands are unremarkable. Kidneys are normal, without renal calculi, focal lesion, or hydronephrosis. Bladder is unremarkable. Stomach/Bowel: The stomach appears normal. There is no evidence of bowel dilatation. Wall thickening of several loops of small bowel seen in left lower quadrant are noted concerning for enteritis. Vascular/Lymphatic: No significant vascular abnormality is noted. Mildly enlarged retroperitoneal lymph nodes are noted with the largest measuring 11 mm, concerning for metastatic disease or malignancy. Reproductive: Uterus and right ovary are unremarkable. 7 cm left ovarian cyst is noted. Further evaluation with MRI is recommended to rule out malignancy. Other: Mild ascites is noted in the pelvis and both upper quadrants. Musculoskeletal: No acute or significant osseous findings. IMPRESSION: 18 mm spiculated density seen in right lower lobe concerning for malignancy. Chest CT with intravenous contrast is  recommended for further evaluation. Mildly nodular hepatic contours are noted concerning for hepatic cirrhosis.  Moderate splenomegaly is noted suggesting portal venous hypertension. Wall thickening of several loops of small bowel is seen in left lower quadrant concerning for enteritis. Mild ascites is noted. Mildly enlarged retroperitoneal lymph nodes are noted concerning for possible metastatic disease or malignancy. 7 mm left ovarian cyst is noted. Further evaluation with MRI is recommended to evaluate for possible neoplasm. Electronically Signed   By: Marijo Conception, M.D.   On: 02/09/2018 17:38   Medications: I have reviewed the patient's current medications. Scheduled Meds: . ferrous sulfate  325 mg Oral Daily  . insulin aspart  0-5 Units Subcutaneous QHS  . insulin aspart  0-9 Units Subcutaneous TID WC  . lactulose  30 g Oral BID  . [START ON 02/13/2018] pantoprazole  40 mg Intravenous Q12H  . senna  1 tablet Oral Daily  . vitamin B-12  1,000 mcg Oral Daily   Continuous Infusions: . sodium chloride    . cefTRIAXone (ROCEPHIN)  IV Stopped (02/10/18 2101)  . dextrose 5 % and 0.45% NaCl 60 mL/hr at 02/10/18 2350  . iron sucrose Stopped (02/10/18 1958)  . octreotide  (SANDOSTATIN)    IV infusion 25 mcg/hr (02/10/18 2306)  . pantoprozole (PROTONIX) infusion 8 mg/hr (02/11/18 0548)   PRN Meds:.acetaminophen **OR** acetaminophen, morphine injection, ondansetron **OR** ondansetron (ZOFRAN) IV   Assessment: Active Problems:   Upper GI bleed    Plan: This patient came in with an upper GI bleed that was attributed to the patient having cirrhosis and varices.  The patient has had a stable hemoglobin.  There is no report of any further sign of GI bleeding.  The patient will have her labs sent off for possible causes of her cirrhosis.  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  The patient has been explained the plan and agrees with it.   LOS: 2 days   Lucilla Lame 02/11/2018, 3:36 PM

## 2018-02-11 NOTE — Progress Notes (Signed)
[place] at [place] NAME: Crystal [name]    MR#:  [id number]  DATE OF BIRTH:  1951-01-31  SUBJECTIVE:  Abdominal pain is improved.  No bowel movement since admission.  REVIEW OF SYSTEMS:    Review of Systems  Constitutional: Negative for fever, chills weight loss HENT: Negative for ear pain, nosebleeds, congestion, facial swelling, rhinorrhea, neck pain, neck stiffness and ear discharge.   Respiratory: Negative for cough, shortness of breath, wheezing  Cardiovascular: Negative for chest pain, palpitations and leg swelling.  Gastrointestinal: Negative for heartburn, abdominal pain, vomiting, diarrhea or consitpation  Genitourinary: Negative for dysuria, urgency, frequency, hematuria Musculoskeletal: Negative for back pain or joint pain Neurological: Negative for dizziness, seizures, syncope, focal weakness,  numbness and headaches.  Hematological: Does not bruise/bleed easily.  Psychiatric/Behavioral: Negative for hallucinations, confusion, dysphoric mood    Tolerating Diet: npo      DRUG ALLERGIES:  No Known Allergies  VITALS:  Blood pressure 138/63, pulse 64, temperature 98.1 F (36.7 C), temperature source Oral, resp. rate 15, height 5' 3" (1.6 m), weight 88 kg (194 lb), SpO2 99 %.  PHYSICAL EXAMINATION:  Constitutional: Appears well-developed and well-nourished. No distress. HENT: Normocephalic. Marland Kitchen Oropharynx is clear and moist.  Eyes: Conjunctivae and EOM are normal. PERRLA, no scleral icterus.  Neck: Normal ROM. Neck supple. No JVD. No tracheal deviation. CVS: RRR, S1/S2 +, no murmurs, no gallops, no carotid bruit.  Pulmonary: Effort and breath sounds normal, no stridor, rhonchi, wheezes, rales.  Abdominal: Soft. BS +,  no distension, tenderness, rebound or guarding.  Musculoskeletal: Normal range of motion. No edema and no tenderness.  Neuro: Alert. CN 2-12 grossly intact. No focal deficits. Skin: Skin is warm and dry. No  rash noted. Psychiatric: Normal mood and affect.      LABORATORY PANEL:   CBC Recent Labs  Lab 02/11/18 0418  WBC 6.1  HGB 8.5*  HCT 26.7*  PLT 196   ------------------------------------------------------------------------------------------------------------------  Chemistries  Recent Labs  Lab 02/09/18 0827  02/11/18 0418  NA 138   < > 137  K 4.0   < > 3.1*  CL 108   < > 108  CO2 21*   < > 22  GLUCOSE 187*   < > 181*  BUN 14   < > <5*  CREATININE 0.56   < > 0.48  CALCIUM 8.4*   < > 7.8*  MG  --   --  1.7  AST 30  --   --   ALT 17  --   --   ALKPHOS 162*  --   --   BILITOT 0.9  --   --    < > = values in this interval not displayed.   ------------------------------------------------------------------------------------------------------------------  Cardiac Enzymes No results for input(s): TROPONINI in the last 168 hours. ------------------------------------------------------------------------------------------------------------------  RADIOLOGY:  Nm Gi Blood Loss  Result Date: 02/09/2018 CLINICAL DATA:  Inpatient.  GI bleed.  Melena. EXAM: NUCLEAR MEDICINE GASTROINTESTINAL BLEEDING SCAN TECHNIQUE: Sequential abdominal images were obtained following intravenous administration of Tc-67mlabeled red blood cells. RADIOPHARMACEUTICALS:  20.9 mCi Tc-959mertechnetate in-vitro labeled red cells. COMPARISON:  02/09/2018 CT abdomen/pelvis. FINDINGS: No scintigraphic evidence of active gastrointestinal bleeding. There is tagged RBC uptake by the enlarged spleen. IMPRESSION: No scintigraphic evidence of active GI bleed. Electronically Signed   By: JaIlona Sorrel.D.   On: 02/09/2018 23:01   Ct Abdomen Pelvis W Contrast  Result Date: 02/09/2018 CLINICAL DATA:  Acute generalized abdominal  pain.  Hematemesis. EXAM: CT ABDOMEN AND PELVIS WITH CONTRAST TECHNIQUE: Multidetector CT imaging of the abdomen and pelvis was performed using the standard protocol following bolus  administration of intravenous contrast. CONTRAST:  157m ISOVUE-300 IOPAMIDOL (ISOVUE-300) INJECTION 61% COMPARISON:  None. FINDINGS: Lower chest: Small left pleural effusion is noted. 18 mm spiculated density is noted in right lower lobe concerning for malignancy. Hepatobiliary: Small gallstone is noted. Mildly nodular hepatic contours are noted concerning for hepatic cirrhosis. No biliary dilatation is noted. Pancreas: Unremarkable. No pancreatic ductal dilatation or surrounding inflammatory changes. Spleen: Moderate splenomegaly is noted. Adrenals/Urinary Tract: Adrenal glands are unremarkable. Kidneys are normal, without renal calculi, focal lesion, or hydronephrosis. Bladder is unremarkable. Stomach/Bowel: The stomach appears normal. There is no evidence of bowel dilatation. Wall thickening of several loops of small bowel seen in left lower quadrant are noted concerning for enteritis. Vascular/Lymphatic: No significant vascular abnormality is noted. Mildly enlarged retroperitoneal lymph nodes are noted with the largest measuring 11 mm, concerning for metastatic disease or malignancy. Reproductive: Uterus and right ovary are unremarkable. 7 cm left ovarian cyst is noted. Further evaluation with MRI is recommended to rule out malignancy. Other: Mild ascites is noted in the pelvis and both upper quadrants. Musculoskeletal: No acute or significant osseous findings. IMPRESSION: 18 mm spiculated density seen in right lower lobe concerning for malignancy. Chest CT with intravenous contrast is recommended for further evaluation. Mildly nodular hepatic contours are noted concerning for hepatic cirrhosis. Moderate splenomegaly is noted suggesting portal venous hypertension. Wall thickening of several loops of small bowel is seen in left lower quadrant concerning for enteritis. Mild ascites is noted. Mildly enlarged retroperitoneal lymph nodes are noted concerning for possible metastatic disease or malignancy. 7 mm left  ovarian cyst is noted. Further evaluation with MRI is recommended to evaluate for possible neoplasm. Electronically Signed   By: JMarijo Conception M.D.   On: 02/09/2018 17:38     ASSESSMENT AND PLAN:   67year old female with no history of EtOH abuse who presented with hematemesis.  1.  Upper GI bleed: Patient underwent endoscopy which shows recent bleeding grade 3 esophageal varices which was banded. Continue octreotide for 72 hours and PPI (started Saturday)  2.  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  Acute blood loss anemia due to upper GI bleed with esophageal varices Status post 3 unit PRBC Iron studies consistent with iron deficiency anemia and low B12  Start B12 supplement  Received iron sucrose  4.18 mm spiculated density seen in right lower lobe concerning for malignancy. Chest CT with intravenous contrast is recommended for further evaluation This may be done as an outpatient  5.  Diabetes: Continue sliding scale 6. Constipation: Lactulose and Senna   Management plans discussed with the patient and family and they are in agreement.  CODE STATUS: FULL  TOTAL TIME TAKING CARE OF THIS PATIENT: 24 minutes.     POSSIBLE D/C 2-3 days, DEPENDING ON CLINICAL CONDITION.   Lakelynn Severtson M.D on 02/11/2018 at 11:29 AM  Between 7am to 6pm - Pager - [phone] After 6pm go to [email or web address] - password [email or web address] [facility]  Office  [phone] CC: Primary care physician; NLorelee Market MD  Note: This dictation was prepared with Dragon dictation along with smaller phrase technology. Any transcriptional errors that result from this process are  unintentional.

## 2018-02-12 LAB — BPAM RBC
Blood Product Expiration Date: 201904112359
Blood Product Expiration Date: 201904162359
Blood Product Expiration Date: 201904162359
Blood Product Expiration Date: 201904232359
Blood Product Expiration Date: 201904232359
ISSUE DATE / TIME: 201903301037
ISSUE DATE / TIME: 201903301839
ISSUE DATE / TIME: 201903311156
Unit Type and Rh: 5100
Unit Type and Rh: 5100
Unit Type and Rh: 5100
Unit Type and Rh: 5100
Unit Type and Rh: 5100

## 2018-02-12 LAB — TYPE AND SCREEN
ABO/RH(D): O POS
Antibody Screen: NEGATIVE
Unit division: 0
Unit division: 0
Unit division: 0
Unit division: 0
Unit division: 0

## 2018-02-12 LAB — CBC
HCT: 26.6 % — ABNORMAL LOW (ref 35.0–47.0)
Hemoglobin: 8.2 g/dL — ABNORMAL LOW (ref 12.0–16.0)
MCH: 21.8 pg — ABNORMAL LOW (ref 26.0–34.0)
MCHC: 30.8 g/dL — ABNORMAL LOW (ref 32.0–36.0)
MCV: 70.7 fL — ABNORMAL LOW (ref 80.0–100.0)
Platelets: 208 10*3/uL (ref 150–440)
RBC: 3.76 MIL/uL — ABNORMAL LOW (ref 3.80–5.20)
RDW: 25 % — ABNORMAL HIGH (ref 11.5–14.5)
WBC: 5.2 10*3/uL (ref 3.6–11.0)

## 2018-02-12 LAB — PROTIME-INR
INR: 1.21
Prothrombin Time: 15.2 seconds (ref 11.4–15.2)

## 2018-02-12 LAB — BASIC METABOLIC PANEL
Anion gap: 6 (ref 5–15)
BUN: <5 mg/dL — ABNORMAL LOW (ref 6–20)
CO2: 22 mmol/L (ref 22–32)
Calcium: 8 mg/dL — ABNORMAL LOW (ref 8.9–10.3)
Chloride: 110 mmol/L (ref 101–111)
Creatinine, Ser: 0.54 mg/dL (ref 0.44–1.00)
GFR calc Af Amer: >60 mL/min (ref >60)
GFR calc non Af Amer: >60 mL/min (ref >60)
Glucose, Bld: 160 mg/dL — ABNORMAL HIGH (ref 65–99)
Potassium: 3.3 mmol/L — ABNORMAL LOW (ref 3.5–5.1)
Sodium: 138 mmol/L (ref 135–145)

## 2018-02-12 LAB — HEPATITIS PANEL, ACUTE
HCV Ab: 1.6 s/co ratio — ABNORMAL HIGH (ref 0.0–0.9)
Hep A IgM: NEGATIVE
Hep B C IgM: NEGATIVE
Hepatitis B Surface Ag: NEGATIVE

## 2018-02-12 LAB — GLUCOSE, CAPILLARY
Glucose-Capillary: 136 mg/dL — ABNORMAL HIGH (ref 65–99)
Glucose-Capillary: 121 mg/dL — ABNORMAL HIGH (ref 65–99)
Glucose-Capillary: 128 mg/dL — ABNORMAL HIGH (ref 65–99)

## 2018-02-12 LAB — CELIAC DISEASE PANEL
Endomysial Ab, IgA: NEGATIVE
IgA: 290 mg/dL (ref 87–352)
Tissue Transglutaminase Ab, IgA: <2 U/mL (ref 0–3)

## 2018-02-12 LAB — MAGNESIUM: Magnesium: 2 mg/dL (ref 1.7–2.4)

## 2018-02-12 LAB — PHOSPHORUS: Phosphorus: 1.9 mg/dL — ABNORMAL LOW (ref 2.5–4.6)

## 2018-02-12 LAB — PREPARE RBC (CROSSMATCH)

## 2018-02-12 LAB — ANTI-SMOOTH MUSCLE ANTIBODY, IGG: F-Actin IgG: 30 Units — ABNORMAL HIGH (ref 0–19)

## 2018-02-12 MED ORDER — NADOLOL 20 MG PO TABS: 20.0000 mg | ORAL_TABLET | Freq: Every day | ORAL | 11 refills | Status: DC

## 2018-02-12 MED ORDER — OMEPRAZOLE 40 MG PO CPDR: 40.0000 mg | DELAYED_RELEASE_CAPSULE | Freq: Every day | ORAL | 0 refills | Status: DC

## 2018-02-12 MED ORDER — K PHOS MONO-SOD PHOS DI & MONO 155-852-130 MG PO TABS
500.0000 mg | ORAL_TABLET | ORAL | Status: DC
  Administered 2018-02-12 (×2): 500 mg via ORAL
  Filled 2018-02-12 (×4): qty 2

## 2018-02-12 MED ORDER — CYANOCOBALAMIN 1000 MCG PO TABS: 1000.0000 ug | ORAL_TABLET | Freq: Every day | ORAL | 0 refills | Status: DC

## 2018-02-12 MED ORDER — FERROUS SULFATE 325 (65 FE) MG PO TABS: 325.0000 mg | ORAL_TABLET | Freq: Every day | ORAL | 3 refills | Status: DC

## 2018-02-12 MED ORDER — POTASSIUM CHLORIDE CRYS ER 20 MEQ PO TBCR
40.0000 meq | EXTENDED_RELEASE_TABLET | Freq: Once | ORAL | Status: AC
  Administered 2018-02-12: 40 meq via ORAL
  Filled 2018-02-12: qty 2

## 2018-02-12 NOTE — Progress Notes (Signed)
MEDICATION RELATED CONSULT NOTE - INITIAL   Pharmacy Consult for electrolyte monitoring/replacement Indication: hypokalemia  No Known Allergies  Patient Measurements: Height: 5' 3" (160 cm) Weight: 194 lb (88 kg) IBW/kg (Calculated) : 52.4  Vital Signs: Temp: 98.2 F (36.8 C) (04/02 0832) Temp Source: Oral (04/02 0832) BP: 154/68 (04/02 0832) Pulse Rate: 61 (04/02 0832) Intake/Output from previous day: 04/01 0701 - 04/02 0700 In: 725 [I.V.:725] Out: -  Intake/Output from this shift: No intake/output data recorded.  Labs: Recent Labs    02/10/18 0416 02/10/18 1700 02/11/18 0418 02/12/18 0724  WBC 4.0  --  6.1 5.2  HGB 6.6* 8.6* 8.5* 8.2*  HCT 21.0*  --  26.7* 26.6*  PLT 188  --  196 208  CREATININE 0.48  --  0.48 0.54  MG  --   --  1.7 2.0  PHOS  --   --   --  1.9*   Estimated Creatinine Clearance: 73.7 mL/min (by C-G formula based on SCr of 0.54 mg/dL).  Assessment: Pharmacy consulted to assist with monitoring/replacement of electrolytes in this 67 year old female admitted with upper GIB and underwent esophageal varices banding.  K = 3.3, Phos = 1.9 are both low Corrected Ca = 9, Mg = 2 is WNL  Goal of Therapy: Electrolytes WNL  Plan:  Potassium chloride 40 mEq PO once K-Phos Neutra 500 mg PO every 4 hours x 4 doses  Will recheck electrolytes with AM labs tomorrow. Pharmacy to continue to follow.  Lenis Noon, PharmD, BCPS Clinical Pharmacist 02/12/2018,9:11 AM

## 2018-02-12 NOTE — Progress Notes (Signed)
Discharge instructions provided and reviewed no questions remaining at time of discharge. Blue copy prescriptions provided. Pt discharged to transition in care to home. With friend providing ride home.

## 2018-02-12 NOTE — Care Management Important Message (Signed)
Important Message  Patient Details  Name: Crystal [name] MRN: [id number] Date of Birth: Jul 10, 1951   Medicare Important Message Given:  Yes    Shelbie Ammons, RN 02/12/2018, 7:11 AM

## 2018-02-12 NOTE — Discharge Summary (Signed)
Crystal [name] at [place] NAME: Crystal [name]    MR#:  [id number]  DATE OF BIRTH:  Aug 04, 1951  DATE OF ADMISSION:  02/09/2018 ADMITTING PHYSICIAN: Gladstone Lighter, MD  DATE OF DISCHARGE: 02/12/2018  PRIMARY CARE PHYSICIAN: Lorelee Market, MD    ADMISSION DIAGNOSIS:  Anemia due to GI blood loss [D50.0] Hematemesis, presence of nausea not specified [K92.0]  DISCHARGE DIAGNOSIS:  Active Problems:   Upper GI bleed   SECONDARY DIAGNOSIS:   Past Medical History:  Diagnosis Date  . Anemia    TAKES IRON TAB  . Arthritis    SHOULDER  . Bronchitis    HX OF  . Diabetes mellitus without complication ([place])    TYPE 2  . Full dentures    UPPER AND LOWER    HOSPITAL COURSE:    67 year old female with no history of EtOH abuse who presented with hematemesis.  1.  Upper GI bleed: Patient underwent endoscopy which shows recent bleeding grade 3 esophageal varices which was banded. She was on octreotide for 72 hours and PPI. She will be discharged on omeprazole and have follow-up with GI in 1 week.  2.  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 Recommendations are for Nadolol 20 mg daily upon discharge Hepatitis C antibody was positive.  HCV quantitative is pending and will be followed up with GI or PCP upon discharge.   3.  Acute blood loss anemia due to upper GI bleed with esophageal varices She has received  3 unit PRBC.  Her hemoglobin is stable. Iron studies were consistent with iron deficiency anemia and low B12  She received IV iron.  She will continue with B12 and ferrous sulfate.  4.18 mm spiculated density seen in right lower lobe concerning for malignancy. Chest CT with intravenous contrast is recommended for further evaluation This will need to be followed up by her PCP  5.  Diabetes: She may continue  metformin for now. 6. Constipation: This is resolved    DISCHARGE CONDITIONS AND DIET:   Stable for discharge on diabetic diet  CONSULTS OBTAINED:  Treatment Team:  Jonathon Bellows, MD  DRUG ALLERGIES:  No Known Allergies  DISCHARGE MEDICATIONS:   Allergies as of 02/12/2018   No Known Allergies     Medication List    STOP taking these medications   diphenhydrAMINE 25 MG tablet Commonly known as:  BENADRYL   ferrous sulfate 325 (65 FE) MG EC tablet Replaced by:  ferrous sulfate 325 (65 FE) MG tablet   fluconazole 150 MG tablet Commonly known as:  DIFLUCAN     TAKE these medications   cyanocobalamin 1000 MCG tablet Take 1 tablet (1,000 mcg total) by mouth daily. Start taking on:  02/13/2018   ferrous sulfate 325 (65 FE) MG tablet Take 1 tablet (325 mg total) by mouth daily. Start taking on:  02/13/2018 Replaces:  ferrous sulfate 325 (65 FE) MG EC tablet   metFORMIN 1000 MG tablet Commonly known as:  GLUCOPHAGE Take 1,000 mg by mouth 2 (two) times daily with a meal. AM AND PM   nadolol 20 MG tablet Commonly known as:  CORGARD Take 1 tablet (20 mg total) by mouth daily.   omeprazole 40 MG capsule Commonly known as:  PRILOSEC Take 1 capsule (40 mg total) by mouth daily.         Today   CHIEF COMPLAINT:  Doing well this morning.   VITAL SIGNS:  Blood pressure (!) 154/68, pulse 61, temperature 98.2 F (36.8 C), temperature source Oral, resp. rate 18, height 5' 3" (1.6 m), weight 88 kg (194 lb), SpO2 94 %.   REVIEW OF SYSTEMS:  Review of Systems  Constitutional: Negative.  Negative for chills, fever and malaise/fatigue.  HENT: Negative.  Negative for ear discharge, ear pain, hearing loss, nosebleeds and sore throat.   Eyes: Negative.  Negative for blurred vision and pain.  Respiratory: Negative.  Negative for cough, hemoptysis, shortness of breath and wheezing.   Cardiovascular: Negative.  Negative for chest pain, palpitations and leg swelling.   Gastrointestinal: Negative.  Negative for abdominal pain, blood in stool, diarrhea, nausea and vomiting.  Genitourinary: Negative.  Negative for dysuria.  Musculoskeletal: Negative.  Negative for back pain.  Skin: Negative.   Neurological: Negative for dizziness, tremors, speech change, focal weakness, seizures and headaches.  Endo/Heme/Allergies: Negative.  Does not bruise/bleed easily.  Psychiatric/Behavioral: Negative.  Negative for depression, hallucinations and suicidal ideas.     PHYSICAL EXAMINATION:  GENERAL:  67 y.o.-year-old patient lying in the bed with no acute distress.  NECK:  Supple, no jugular venous distention. No thyroid enlargement, no tenderness.  LUNGS: Normal breath sounds bilaterally, no wheezing, rales,rhonchi  No use of accessory muscles of respiration.  CARDIOVASCULAR: S1, S2 normal. No murmurs, rubs, or gallops.  ABDOMEN: Soft, non-tender, non-distended. Bowel sounds present. No organomegaly or mass.  EXTREMITIES: No pedal edema, cyanosis, or clubbing.  PSYCHIATRIC: The patient is alert and oriented x 3.  SKIN: No obvious rash, lesion, or ulcer.   DATA REVIEW:   CBC Recent Labs  Lab 02/12/18 0724  WBC 5.2  HGB 8.2*  HCT 26.6*  PLT 208    Chemistries  Recent Labs  Lab 02/09/18 0827  02/12/18 0724  NA 138   < > 138  K 4.0   < > 3.3*  CL 108   < > 110  CO2 21*   < > 22  GLUCOSE 187*   < > 160*  BUN 14   < > <5*  CREATININE 0.56   < > 0.54  CALCIUM 8.4*   < > 8.0*  MG  --    < > 2.0  AST 30  --   --   ALT 17  --   --   ALKPHOS 162*  --   --   BILITOT 0.9  --   --    < > = values in this interval not displayed.    Cardiac Enzymes No results for input(s): TROPONINI in the last 168 hours.  Microbiology Results  _0 @  RADIOLOGY:  No results found.    Allergies as of 02/12/2018   No Known Allergies     Medication List    STOP taking these medications   diphenhydrAMINE 25 MG tablet Commonly known as:  BENADRYL    ferrous sulfate 325 (65 FE) MG EC tablet Replaced by:  ferrous sulfate 325 (65 FE) MG tablet   fluconazole 150 MG tablet Commonly known as:  DIFLUCAN     TAKE these medications   cyanocobalamin 1000 MCG tablet Take 1 tablet (1,000 mcg total) by mouth daily. Start taking on:  02/13/2018   ferrous sulfate 325 (65 FE) MG tablet Take 1 tablet (325 mg total) by mouth daily. Start taking on:  02/13/2018 Replaces:  ferrous sulfate 325 (65 FE) MG EC tablet   metFORMIN 1000 MG tablet Commonly known as:  GLUCOPHAGE Take 1,000 mg by mouth 2 (two) times daily with a meal. AM AND PM   nadolol 20 MG tablet Commonly known as:  CORGARD Take 1 tablet (20 mg total) by mouth daily.   omeprazole 40 MG capsule Commonly known as:  PRILOSEC Take 1 capsule (40 mg total) by mouth daily.          Management plans discussed with the patient and she is in agreement. Stable for discharge home  Patient should follow up with pcp and GI  CODE STATUS:     Code Status Orders  (From admission, onward)        Start     Ordered   02/09/18 1221  Full code  Continuous     02/09/18 1220    Code Status History    This patient has a current code status but no historical code status.      TOTAL TIME TAKING CARE OF THIS PATIENT: 39 minutes.    Note: This dictation was prepared with Dragon dictation along with smaller phrase technology. Any transcriptional errors that result from this process are unintentional.  Anant Agard M.D on 02/12/2018 at 10:28 AM  Between 7am to 6pm - Pager - [phone] After 6pm go to [email or web address] - password [email or web address] [facility]  Office  [phone]  CC: Primary care physician; Lorelee Market, MD

## 2018-02-13 LAB — IGG, IGA, IGM
IgA: 314 mg/dL (ref 87–352)
IgG (Immunoglobin G), Serum: 1375 mg/dL (ref 700–1600)
IgM (Immunoglobulin M), Srm: 793 mg/dL — ABNORMAL HIGH (ref 26–217)

## 2018-02-13 LAB — HEPATITIS PANEL, ACUTE
HCV Ab: 1.6 s/co ratio — ABNORMAL HIGH (ref 0.0–0.9)
Hep A IgM: NEGATIVE
Hep B C IgM: NEGATIVE
Hepatitis B Surface Ag: NEGATIVE

## 2018-02-13 LAB — AFP TUMOR MARKER: AFP, Serum, Tumor Marker: 1.2 ng/mL (ref 0.0–8.3)

## 2018-02-13 LAB — MITOCHONDRIAL ANTIBODIES: Mitochondrial M2 Ab, IgG: <20 Units (ref 0.0–20.0)

## 2018-02-13 LAB — CERULOPLASMIN: Ceruloplasmin: 28.4 mg/dL (ref 19.0–39.0)

## 2018-02-13 LAB — ALPHA-1-ANTITRYPSIN: A-1 Antitrypsin, Ser: 165 mg/dL (ref 90–200)

## 2018-02-14 ENCOUNTER — Other Ambulatory Visit: Payer: Self-pay

## 2018-02-14 ENCOUNTER — Ambulatory Visit: Payer: Medicare HMO | Admitting: Gastroenterology

## 2018-02-14 ENCOUNTER — Telehealth: Payer: Self-pay

## 2018-02-14 ENCOUNTER — Encounter: Payer: Self-pay | Admitting: Gastroenterology

## 2018-02-14 ENCOUNTER — Encounter: Payer: Self-pay | Admitting: *Deleted

## 2018-02-14 VITALS — BP 152/71 | HR 83 | Temp 98.2°F | Ht 63.0 in | Wt 195.4 lb

## 2018-02-14 DIAGNOSIS — R188 Other ascites: Secondary | ICD-10-CM | POA: Diagnosis not present

## 2018-02-14 DIAGNOSIS — R911 Solitary pulmonary nodule: Secondary | ICD-10-CM

## 2018-02-14 DIAGNOSIS — R590 Localized enlarged lymph nodes: Secondary | ICD-10-CM | POA: Diagnosis not present

## 2018-02-14 DIAGNOSIS — I8511 Secondary esophageal varices with bleeding: Secondary | ICD-10-CM | POA: Diagnosis not present

## 2018-02-14 DIAGNOSIS — E1169 Type 2 diabetes mellitus with other specified complication: Secondary | ICD-10-CM

## 2018-02-14 DIAGNOSIS — D500 Iron deficiency anemia secondary to blood loss (chronic): Secondary | ICD-10-CM

## 2018-02-14 DIAGNOSIS — R591 Generalized enlarged lymph nodes: Secondary | ICD-10-CM

## 2018-02-14 DIAGNOSIS — K7460 Unspecified cirrhosis of liver: Secondary | ICD-10-CM

## 2018-02-14 DIAGNOSIS — K922 Gastrointestinal hemorrhage, unspecified: Secondary | ICD-10-CM

## 2018-02-14 MED ORDER — POLYETHYLENE GLYCOL 3350 17 GM/SCOOP PO POWD: 17.0000 g | Freq: Two times a day (BID) | ORAL | 2 refills | Status: DC

## 2018-02-14 MED ORDER — FUROSEMIDE 20 MG PO TABS: 20.0000 mg | ORAL_TABLET | Freq: Every day | ORAL | 0 refills | Status: DC

## 2018-02-14 MED ORDER — PROPRANOLOL HCL 20 MG PO TABS: 20.0000 mg | ORAL_TABLET | Freq: Two times a day (BID) | ORAL | 1 refills | Status: DC

## 2018-02-14 MED ORDER — SPIRONOLACTONE 50 MG PO TABS
50.0000 mg | ORAL_TABLET | Freq: Every day | ORAL | 0 refills | Status: DC
Start: 2018-02-14 — End: 2018-04-19

## 2018-02-14 NOTE — Telephone Encounter (Signed)
Pt was seen in our office today by Dr. Marius Ditch. Results were discussed with her during this visit.

## 2018-02-14 NOTE — Patient Instructions (Addendum)
1. Stop nadolol, start propranolol 65m two times daily, call my office if you feel dizzy or lightheaded 2. Start lasix (furosemide) 269mdaily with breakfast, this is water pill to remove fluid from your body 3. Start spironolactone 5085maily with breakfast, this is another  water pill to remove fluid from your body and retain potassium 4. Continue iron pills, 2-3 times daily, will turn your stools dark and may cause constipation. Take miralax 1-2 times daily if needed to relieve constipation 5. Continue B12 1000m12mnce daily 6. Labs in 1week 7. You are referred to cancer center to evaluate lung nodule and enlarged lymph nodes 8. Low sodium diet, information is provided below 9. Follow up with me in 1week  Please call our office to speak with my nurse KamiOrvilla [phone] business hours from 8am to 4pm if you have any questions/concerns. During after hours, you will be redirected to on call GI physician. For any emergency please call 911 or go the nearest emergency room.    Crystal [name] [id number] [id number]: [phone]: [phone]  Ascites Ascites is a collection of excess fluid in the abdomen. Ascites can range from mild to severe. It can get worse without treatment. What are the causes? Possible causes include:  Cirrhosis. This is the most common cause of ascites.  Infection or inflammation in the abdomen.  Cancer in the abdomen.  Heart failure.  Kidney disease.  Inflammation of the pancreas.  Clots in the veins of the liver.  What are the signs or symptoms? Signs and symptoms may include:  A feeling of fullness in your abdomen. This is common.  An increase in the size of your abdomen or your waist.  Swelling in your legs.  Swelling of the scrotum in men.  Difficulty breathing.  Abdominal pain.  Sudden weight gain.  If the condition is mild, you may not have symptoms. How is  this diagnosed? To make a diagnosis, your health care provider will:  Ask about your medical history.  Perform a physical exam.  Order imaging tests, such as an ultrasound or CT scan of your abdomen.  How is this treated? Treatment depends on the cause of the ascites. It may include:  Taking a pill to make you urinate. This is called a water pill (diuretic pill).  Strictly reducing your salt (sodium) intake. Salt can cause extra fluid to be kept in the body, and this makes ascites worse.  Having a procedure to remove fluid from your abdomen (paracentesis).  Having a procedure to transfer fluid from your abdomen into a vein.  Having a procedure that connects two of the major veins within your liver and relieves pressure on your liver (TIPS procedure).  Ascites may go away or improve with treatment of the condition that caused it. Follow these instructions at home:  Keep track of your weight. To do this, weigh yourself at the same time every day and record your weight.  Keep track of how much you drink and any changes in the amount you urinate.  Follow any instructions that your health care provider gives you about how much to drink.  Try not to eat salty (high-sodium) foods.  Take medicines only as directed by your health care provider.  Keep all follow-up visits as directed by your health care provider. This is important.  Report any changes in your health to your health  care provider, especially if you develop new symptoms or your symptoms get worse. Contact a health care provider if:  Your gain more than 3 pounds in 3 days.  Your abdominal size or your waist size increases.  You have new swelling in your legs.  The swelling in your legs gets worse. Get help right away if:  You develop a fever.  You develop confusion.  You develop new or worsening difficulty breathing.  You develop new or worsening abdominal pain.  You develop new or worsening swelling in the  scrotum (in men). This information is not intended to replace advice given to you by your health care provider. Make sure you discuss any questions you have with your health care provider. Document Released: 10/30/2005 Document Revised: 03/08/2016 Document Reviewed: 05/29/2014 Elsevier Interactive Patient Education  2018 [employer].  Cirrhosis Cirrhosis is long-term (chronic) liver injury. The liver is your largest internal organ, and it performs many functions. The liver converts food into energy, removes toxic material from your blood, makes important proteins, and absorbs necessary vitamins from your diet. If you have cirrhosis, it means many of your healthy liver cells have been replaced by scar tissue. This prevents blood from flowing through your liver, which makes it difficult for your liver to function. This scarring is not reversible, but treatment can prevent it from getting worse. What are the causes? Hepatitis C and long-term alcohol abuse are the most common causes of cirrhosis. Other causes include:  Nonalcoholic fatty liver disease.  Hepatitis B infection.  Autoimmune hepatitis.  Diseases that cause blockage of ducts inside the liver.  Inherited liver diseases.  Reactions to certain long-term medicines.  Parasitic infections.  Long-term exposure to certain toxins.  What increases the risk? You may have a higher risk of cirrhosis if you:  Have certain hepatitis viruses.  Abuse alcohol, especially if you are female.  Are overweight.  Share needles.  Have unprotected sex with someone who has hepatitis.  What are the signs or symptoms? You may not have any signs and symptoms at first. Symptoms may not develop until the damage to your liver starts to get worse. Signs and symptoms of cirrhosis may include:  Tenderness in the right-upper part of your abdomen.  Weakness and tiredness (fatigue).  Loss of appetite.  Nausea.  Weight loss and muscle  loss.  Itchiness.  Yellow skin and eyes (jaundice).  Buildup of fluid in the abdomen (ascites).  Swelling of the feet and ankles (edema).  Appearance of tiny blood vessels under the skin.  Mental confusion.  Easy bruising and bleeding.  How is this diagnosed? Your health care provider may suspect cirrhosis based on your symptoms and medical history, especially if you have other medical conditions or a history of alcohol abuse. Your health care provider will do a physical exam to feel your liver and check for signs of cirrhosis. Your health care provider may perform other tests, including:  Blood tests to check: ? Whether you have hepatitis B or C. ? Kidney function. ? Liver function.  Imaging tests such as: ? MRI or CT scan to look for changes seen in advanced cirrhosis. ? Ultrasound to see if normal liver tissue is being replaced by scar tissue.  A procedure using a long needle to take a sample of liver tissue (biopsy) for examination under a microscope. Liver biopsy can confirm the diagnosis of cirrhosis.  How is this treated? Treatment depends on how damaged your liver is and what caused the damage.  Treatment may include treating cirrhosis symptoms or treating the underlying causes of the condition to try to slow the progression of the damage. Treatment may include:  Making lifestyle changes, such as: ? Eating a healthy diet. ? Restricting salt intake. ? Maintaining a healthy weight. ? Not abusing drugs or alcohol.  Taking medicines to: ? Treat liver infections or other infections. ? Control itching. ? Reduce fluid buildup. ? Reduce certain blood toxins. ? Reduce risk of bleeding from enlarged blood vessels in the stomach or esophagus (varices).  If varices are causing bleeding problems, you may need treatment with a procedure that ties up the vessels causing them to fall off (band ligation).  If cirrhosis is causing your liver to fail, your health care provider may  recommend a liver transplant.  Other treatments may be recommended depending on any complications of cirrhosis, such as liver-related kidney failure (hepatorenal syndrome).  Follow these instructions at home:  Take medicines only as directed by your health care provider. Do not use drugs that are toxic to your liver. Ask your health care provider before taking any new medicines, including over-the-counter medicines.  Rest as needed.  Eat a well-balanced diet. Ask your health care provider or dietitian for more information.  You may have to follow a low-salt diet or restrict your water intake as directed.  Do not drink alcohol. This is especially important if you are taking acetaminophen.  Keep all follow-up visits as directed by your health care provider. This is important. Contact a health care provider if:  You have fatigue or weakness that is getting worse.  You develop swelling of the hands, feet, legs, or face.  You have a fever.  You develop loss of appetite.  You have nausea or vomiting.  You develop jaundice.  You develop easy bruising or bleeding. Get help right away if:  You vomit bright red blood or a material that looks like coffee grounds.  You have blood in your stools.  Your stools appear black and tarry.  You become confused.  You have chest pain or trouble breathing. This information is not intended to replace advice given to you by your health care provider. Make sure you discuss any questions you have with your health care provider. Document Released: 10/30/2005 Document Revised: 03/09/2016 Document Reviewed: 07/08/2014 Elsevier Interactive Patient Education  2018 [place].  Low-Sodium Eating Plan Sodium, which is an element that makes up salt, helps you maintain a healthy balance of fluids in your body. Too much sodium can increase your blood pressure and cause fluid and waste to be held in your body. Your health care provider or dietitian may  recommend following this plan if you have high blood pressure (hypertension), kidney disease, liver disease, or heart failure. Eating less sodium can help lower your blood pressure, reduce swelling, and protect your heart, liver, and kidneys. What are tips for following this plan? General guidelines  Most people on this plan should limit their sodium intake to 1,500-2,000 mg (milligrams) of sodium each day. Reading food labels  The Nutrition Facts label lists the amount of sodium in one serving of the food. If you eat more than one serving, you must multiply the listed amount of sodium by the number of servings.  Choose foods with less than 140 mg of sodium per serving.  Avoid foods with 300 mg of sodium or more per serving. Shopping  Look for lower-sodium products, often labeled as "low-sodium" or "no salt added."  Always check the  sodium content even if foods are labeled as "unsalted" or "no salt added".  Buy fresh foods. ? Avoid canned foods and premade or frozen meals. ? Avoid canned, cured, or processed meats  Buy breads that have less than 80 mg of sodium per slice. Cooking  Eat more home-cooked food and less restaurant, buffet, and fast food.  Avoid adding salt when cooking. Use salt-free seasonings or herbs instead of table salt or sea salt. Check with your health care provider or pharmacist before using salt substitutes.  Cook with plant-based oils, such as canola, sunflower, or olive oil. Meal planning  When eating at a restaurant, ask that your food be prepared with less salt or no salt, if possible.  Avoid foods that contain MSG (monosodium glutamate). MSG is sometimes added to [place] food, bouillon, and some canned foods. What foods are recommended? The items listed may not be a complete list. Talk with your dietitian about what dietary choices are best for you. Grains Low-sodium cereals, including oats, puffed wheat and rice, and shredded wheat. Low-sodium  crackers. Unsalted rice. Unsalted pasta. Low-sodium bread. Whole-grain breads and whole-grain pasta. Vegetables Fresh or frozen vegetables. "No salt added" canned vegetables. "No salt added" tomato sauce and paste. Low-sodium or reduced-sodium tomato and vegetable juice. Fruits Fresh, frozen, or canned fruit. Fruit juice. Meats and other protein foods Fresh or frozen (no salt added) meat, poultry, seafood, and fish. Low-sodium canned tuna and salmon. Unsalted nuts. Dried peas, beans, and lentils without added salt. Unsalted canned beans. Eggs. Unsalted nut butters. Dairy Milk. Soy milk. Cheese that is naturally low in sodium, such as ricotta cheese, fresh mozzarella, or Swiss cheese Low-sodium or reduced-sodium cheese. Cream cheese. Yogurt. Fats and oils Unsalted butter. Unsalted margarine with no trans fat. Vegetable oils such as canola or olive oils. Seasonings and other foods Fresh and dried herbs and spices. Salt-free seasonings. Low-sodium mustard and ketchup. Sodium-free salad dressing. Sodium-free light mayonnaise. Fresh or refrigerated horseradish. Lemon juice. Vinegar. Homemade, reduced-sodium, or low-sodium soups. Unsalted popcorn and pretzels. Low-salt or salt-free chips. What foods are not recommended? The items listed may not be a complete list. Talk with your dietitian about what dietary choices are best for you. Grains Instant hot cereals. Bread stuffing, pancake, and biscuit mixes. Croutons. Seasoned rice or pasta mixes. Noodle soup cups. Boxed or frozen macaroni and cheese. Regular salted crackers. Self-rising flour. Vegetables Sauerkraut, pickled vegetables, and relishes. Olives. [place] fries. Onion rings. Regular canned vegetables (not low-sodium or reduced-sodium). Regular canned tomato sauce and paste (not low-sodium or reduced-sodium). Regular tomato and vegetable juice (not low-sodium or reduced-sodium). Frozen vegetables in sauces. Meats and other protein foods Meat or  fish that is salted, canned, smoked, spiced, or pickled. Bacon, ham, sausage, hotdogs, corned beef, chipped beef, packaged lunch meats, salt pork, jerky, pickled herring, anchovies, regular canned tuna, sardines, salted nuts. Dairy Processed cheese and cheese spreads. Cheese curds. Blue cheese. Feta cheese. String cheese. Regular cottage cheese. Buttermilk. Canned milk. Fats and oils Salted butter. Regular margarine. Ghee. Bacon fat. Seasonings and other foods Onion salt, garlic salt, seasoned salt, table salt, and sea salt. Canned and packaged gravies. Worcestershire sauce. Tartar sauce. Barbecue sauce. Teriyaki sauce. Soy sauce, including reduced-sodium. Steak sauce. Fish sauce. Oyster sauce. Cocktail sauce. Horseradish that you find on the shelf. Regular ketchup and mustard. Meat flavorings and tenderizers. Bouillon cubes. Hot sauce and Tabasco sauce. Premade or packaged marinades. Premade or packaged taco seasonings. Relishes. Regular salad dressings. Salsa. Potato and tortilla chips. Corn chips  and puffs. Salted popcorn and pretzels. Canned or dried soups. Pizza. Frozen entrees and pot pies. Summary  Eating less sodium can help lower your blood pressure, reduce swelling, and protect your heart, liver, and kidneys.  Most people on this plan should limit their sodium intake to 1,500-2,000 mg (milligrams) of sodium each day.  Canned, boxed, and frozen foods are high in sodium. Restaurant foods, fast foods, and pizza are also very high in sodium. You also get sodium by adding salt to food.  Try to cook at home, eat more fresh fruits and vegetables, and eat less fast food, canned, processed, or prepared foods. This information is not intended to replace advice given to you by your health care provider. Make sure you discuss any questions you have with your health care provider. Document Released: 04/21/2002 Document Revised: 10/23/2016 Document Reviewed: 10/23/2016 Elsevier Interactive Patient  Education  [employer].

## 2018-02-14 NOTE — Progress Notes (Signed)
  Oncology Nurse Navigator Documentation  Navigator Location: [facility]Med Onc (02/14/18 1600) Referral date to RadOnc/MedOnc: 02/14/18 (02/14/18 1600) )Navigator Encounter Type: Introductory phone call;Telephone (02/14/18 1600) Telephone: Lahoma Crocker Call;Appt Confirmation/Clarification (02/14/18 1600) Abnormal Finding Date: 02/09/18 (02/14/18 1600)                     Barriers/Navigation Needs: Coordination of Care (02/14/18 1600)   Interventions: Coordination of Care (02/14/18 1600)   Coordination of Care: Appts (02/14/18 1600)        Acuity: Level 2 (02/14/18 1600)   Acuity Level 2: Initial guidance, education and coordination as needed;Educational needs;Assistance expediting appointments (02/14/18 1600)  phone call made to patient to schedule med-onc consultation. Initial appt for Friday 4/5 at 2:30pm given to patient but pt declined appt stating that had another appt already scheduled tomorrow afternoon. Pt given appt for Monday 4/8 at 3:15pm with Dr. Mike Gip. Pt confirmed appt. Contact info given and instructed to call with any questions or concerns. Pt verbalized understanding.   Time Spent with Patient: 45 (02/14/18 1600)

## 2018-02-14 NOTE — Progress Notes (Signed)
[name], MD [address]  [place]  [place], [place] [address]  Main: [phone]  Fax: [phone]    Gastroenterology Consultation  Referring Provider:     Lorelee Market, MD Primary Care Physician:  Lorelee Market, MD Primary Gastroenterologist:  Dr. [name] Reason for Consultation:     Decompensated cirrhosis        HPI:   Crystal [name] is a 67 y.o. female referred by Dr. Lorelee Market, MD  for consultation & management of decompensated cirrhosis. Patient was recently diagnosed with decompensated cirrhosis, when she presented to [facility] on 02/09/2018 with large volume hematemesis secondary to variceal hemorrhage, underwent upper endoscopy on 02/10/2018 by Dr. Vicente Males, found to have large esophageal varices with red wale signs and underwent variceal ligation x 10. Her bleeding resolved by the time of discharge. Patient was found to have hepatitis C antibodies and HCV virus was not detected. She had other secondary liver disease workup which revealed weakly positive anti-smooth muscle antibodies. She has long-standing history of diabetes, on metformin. She has severe iron deficiency and B12 deficiency, discharged on oral iron and oral B12 supplementation. She was supposed to start nadolol but she did not get prescription filled due to high cost. Patient was just discharged from the hospital 2 days ago  Interval summary: since discharge, patient has been doing fairly well. She denies hematemesis, melena, rectal bleeding. She reports worsening of abdominal distention and swelling of legs which started prior to hospitalization. She is not on diuretics. She is taking iron and B12 supplements, omeprazole.she reports her stools are dark but formed. She wants to know if she can restart drinking Coke. She is not on a low-sodium diet. She denies history of heavy smoking or heavy alcohol use in the past. She denies easy bruising, altered  sleep cycle   She is single, lives with a roommate  NSAIDs: none including BC powder or Goody powder  Antiplts/Anticoagulants/Anti thrombotics: none  GI Procedures:  Did not have a colonoscopy in the past EGD 02/09/18 The examined duodenum was normal. The stomach was normal. Five columns of non-bleeding grade III varices were found in the lower third of the esophagus,. Stigmata of recent bleeding were evident and red wale signs were present. Ten bands were successfully placed with incomplete eradication of varices. There was no bleeding during, and at the end, of the procedure.  Past Medical History:  Diagnosis Date  . Anemia    TAKES IRON TAB  . Arthritis    SHOULDER  . Bronchitis    HX OF  . Diabetes mellitus without complication ([place])    TYPE 2  . Full dentures    UPPER AND LOWER    Past Surgical History:  Procedure Laterality Date  . CATARACT EXTRACTION W/PHACO Right 02/06/2018   Procedure: CATARACT EXTRACTION PHACO AND INTRAOCULAR LENS PLACEMENT ([place]) RIGHT DIABETIC;  Surgeon: Leandrew Koyanagi, MD;  Location: [place];  Service: Ophthalmology;  Laterality: Right;  DIABETIC, ORAL MED  . DENTAL SURGERY     EXTRACTIONS  . ESOPHAGOGASTRODUODENOSCOPY (EGD) WITH PROPOFOL N/A 02/10/2018   Procedure: ESOPHAGOGASTRODUODENOSCOPY (EGD) WITH PROPOFOL;  Surgeon: Jonathon Bellows, MD;  Location: [facility] ENDOSCOPY;  Service: Gastroenterology;  Laterality: N/A;     Current Outpatient Medications:  .  ferrous sulfate 325 (65 FE) MG tablet, Take 1 tablet (325 mg total) by mouth daily., Disp: 30 tablet, Rfl: 3 .  metFORMIN (GLUCOPHAGE) 1000 MG tablet, Take 1,000 mg by mouth 2 (two)  times daily with a meal. AM AND PM, Disp: , Rfl:  .  omeprazole (PRILOSEC) 40 MG capsule, Take 1 capsule (40 mg total) by mouth daily., Disp: 30 capsule, Rfl: 0 .  sulfacetamide (BLEPH-10) 10 % ophthalmic solution, PLACE 2 DROPS INTO AFFECTED EYE(S) EVERY TWO TO FOUR HOURS AS NEEDED, Disp: , Rfl:  .   vitamin B-12 1000 MCG tablet, Take 1 tablet (1,000 mcg total) by mouth daily., Disp: 120 tablet, Rfl: 0 .  furosemide (LASIX) 20 MG tablet, Take 1 tablet (20 mg total) by mouth daily., Disp: 90 tablet, Rfl: 0 .  polyethylene glycol powder (GLYCOLAX/MIRALAX) powder, Take 17 g by mouth 2 (two) times daily., Disp: 255 g, Rfl: 2 .  propranolol (INDERAL) 20 MG tablet, Take 1 tablet (20 mg total) by mouth 2 (two) times daily., Disp: 180 tablet, Rfl: 1 .  spironolactone (ALDACTONE) 50 MG tablet, Take 1 tablet (50 mg total) by mouth daily., Disp: 90 tablet, Rfl: 0   Family History  Problem Relation Age of Onset  . CAD Father      Social History   Tobacco Use  . Smoking status: Never Smoker  . Smokeless tobacco: Never Used  Substance Use Topics  . Alcohol use: No    Frequency: Never  . Drug use: Not on file    Allergies as of 02/14/2018  . (No Known Allergies)    Review of Systems:    All systems reviewed and negative except where noted in HPI.   Physical Exam:  BP (!) 152/71   Pulse 83   Temp 98.2 F (36.8 C) (Oral)   Ht 5' 3" (1.6 m)   Wt 195 lb 6.4 oz (88.6 kg)   BMI 34.61 kg/m  No LMP recorded. Patient is postmenopausal.  General:   Alert,  Well-developed, well-nourished, pleasant and cooperative in NAD Head:  Normocephalic and atraumatic. Eyes:  Sclera clear, no icterus.   Conjunctiva pink. Ears:  Normal auditory acuity. Nose:  No deformity, discharge, or lesions. Mouth:  No deformity or lesions,oropharynx pink & moist. Neck:  Supple; no masses or thyromegaly. Lungs:  Respirations even and unlabored.  Clear throughout to auscultation.   No wheezes, crackles, or rhonchi. No acute distress. Heart:  Regular rate and rhythm; no murmurs, clicks, rubs, or gallops. Abdomen:  Normal bowel sounds. Soft, non-tender and diffusely distended, severe, dull to percussion secondary to ascites, without masses, hepatosplenomegaly or hernias noted.  No guarding or rebound tenderness.     Rectal: Not performed Msk:  Symmetrical without gross deformities. muscle wasting in bilateral upper extremities Good, equal movement & strength bilaterally. Pulses:  Normal pulses noted. Extremities:  No clubbing, 3+edema.  No cyanosis. Neurologic:  Alert and oriented x3;  grossly normal neurologically. Skin: telangiectasias on upper anterior chest, No jaundice. Lymph Nodes:  No significant cervical adenopathy. Psych:  Alert and cooperative. Normal mood and affect.  Imaging Studies: Reviewed. CT A/P with contrast on 02/09/2018 revealed mildly enlarged retroperitoneal lymph nodes, largest with 11 mm concerning for metastatic disease or malignancy,18 mm spiculated density in the right lower lobe concerning for malignancy associated with small left pleural effusion  Assessment and Plan:   Crystal [name] is a 67 y.o. Caucasian female with obesity, diabetes on metformin with new diagnosis of decompensated cirrhosis secondary to ascites and variceal bleed from esophageal varices status post variceal ligation on 02/10/2018 seen in consultation for further management.   Decompensated cirrhosis: child class B, MELD-Na 9 Secondary liver disease workup positive for HCV  antibodies, but viral load not detected, anti-smooth muscle antibodies weakly positive. 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 Ceruloplasmin, antimitochondrial antibodies, hepatitis B, alpha-1 antitrypsin came back negative Portal hypertension: manifested as ascites, esophageal varices, splenomegaly Ascites/volume overload: associated with bilateral swelling of legs. Recommend to start furosemide 20 mg and spironolactone 50 mg daily.Check BMP and magnesium in 1 week, increase the dose as needed. Educated her about low-sodium diet. Avoid processed meats, and foods, chips, carbonated beverages. Provided her with educational material about low-sodium diet Varices: variceal bleed from  esophageal varices, status post EGD on 02/10/2018, [id number] Switch from Nadolol to propranolol 20 mg twice a day and adjust the dose based on heart rate Repeat EGD in 4 weeks for variceal surveillance and banding if necessary She will be a good candidate for TIPS if she develops recurrent variceal bleed Continue omeprazole daily Anemia: she has developed severe iron deficiency anemia secondary to variceal hemorrhage. Hemoglobin stable at the time of discharge. Recheck CBC in 1 week. Increase oral iron to 2-3 pills daily. She will probably benefit from parenteral iron therapy. Also, has B12 deficiency, continue oral B12 1038mg daily. There is no evidence of coagulopathy or thrombocytopenia HCC screening: AFP normal, no liver lesions on CT in 01/2018. Recheck AFP and ultrasound liver in 6 months PSE: none HRS: none Hepatic hydrothorax: none  Health maintenance: Recommend vaccination against hepatitis A and B Check vitamin D levels Avoid excess Tylenol and NSAIDs use Colon cancer screening: recommend colonoscopy when she is clinically stable Right lower lobe lung lesion and retroperitoneal lymphadenopathy: [facility] and also for IV iron  Follow up in 1-2 weeks   RCephas Darby MD

## 2018-02-14 NOTE — Progress Notes (Signed)
Crystal [name]

## 2018-02-14 NOTE — Telephone Encounter (Signed)
-----  Message from Lucilla Lame, MD sent at 02/13/2018  1:32 PM EDT ----- This patient needs an office follow up.

## 2018-02-15 LAB — ANTINUCLEAR ANTIBODIES, IFA: ANA Ab, IFA: POSITIVE — AB

## 2018-02-15 LAB — FANA STAINING PATTERNS: CENTROMERE AB: >1:1280 {titer}

## 2018-02-15 LAB — HCV RT-PCR, QUANT (NON-GRAPH)

## 2018-02-15 LAB — HCV RNA QUANT: HCV Quantitative: NOT DETECTED IU/mL (ref >50)

## 2018-02-15 LAB — HCV AB W REFLEX TO QUANT PCR: HCV Ab: 1.6 s/co ratio — ABNORMAL HIGH (ref 0.0–0.9)

## 2018-02-18 ENCOUNTER — Encounter: Payer: Self-pay | Admitting: *Deleted

## 2018-02-18 ENCOUNTER — Inpatient Hospital Stay: Payer: Medicare HMO | Attending: Hematology and Oncology | Admitting: Hematology and Oncology

## 2018-02-18 ENCOUNTER — Encounter: Payer: Self-pay | Admitting: Hematology and Oncology

## 2018-02-18 ENCOUNTER — Other Ambulatory Visit: Payer: Self-pay

## 2018-02-18 ENCOUNTER — Other Ambulatory Visit: Payer: Self-pay | Admitting: Urgent Care

## 2018-02-18 VITALS — BP 119/72 | HR 66 | Temp 98.6°F | Resp 18 | Ht 63.0 in | Wt 192.0 lb

## 2018-02-18 DIAGNOSIS — B1920 Unspecified viral hepatitis C without hepatic coma: Secondary | ICD-10-CM

## 2018-02-18 DIAGNOSIS — N83202 Unspecified ovarian cyst, left side: Secondary | ICD-10-CM

## 2018-02-18 DIAGNOSIS — E538 Deficiency of other specified B group vitamins: Secondary | ICD-10-CM

## 2018-02-18 DIAGNOSIS — D500 Iron deficiency anemia secondary to blood loss (chronic): Secondary | ICD-10-CM

## 2018-02-18 DIAGNOSIS — R768 Other specified abnormal immunological findings in serum: Secondary | ICD-10-CM

## 2018-02-18 DIAGNOSIS — R591 Generalized enlarged lymph nodes: Secondary | ICD-10-CM

## 2018-02-18 DIAGNOSIS — Z8719 Personal history of other diseases of the digestive system: Secondary | ICD-10-CM

## 2018-02-18 DIAGNOSIS — R911 Solitary pulmonary nodule: Secondary | ICD-10-CM

## 2018-02-18 DIAGNOSIS — R7689 Other specified abnormal immunological findings in serum: Secondary | ICD-10-CM | POA: Insufficient documentation

## 2018-02-18 DIAGNOSIS — R599 Enlarged lymph nodes, unspecified: Secondary | ICD-10-CM

## 2018-02-18 HISTORY — DX: Iron deficiency anemia secondary to blood loss (chronic): D50.0

## 2018-02-18 HISTORY — DX: Deficiency of other specified B group vitamins: E53.8

## 2018-02-18 NOTE — Progress Notes (Signed)
Patient here today as new evaluation regarding pulmonary nodules/lymphadenopathy.  Referred by Dr. Marius Ditch.

## 2018-02-18 NOTE — Progress Notes (Signed)
[facility] day:  02/18/2018  Chief Complaint: Crystal [name] is a 67 y.o. female with a pulmonary nodule and lymphadenopathy who was referred in consultation by Dr Marius Ditch for assessment and management.  HPI:  The patient was admitted to [facility] from 02/09/2018 - 02/12/2018 with an upper GI bleed.  She had large volume hematemesis secondary to variceal hemorrhage.    EGD on 02/10/2018 by Dr. Vicente Males revealed 5 columns of grade III esophageal varices in the lower third of the esophagus.  There was stigmata of recent bleeding and red wale signs.  She underwent variceal ligation x 10.  Bleeding resolved by the time of discharge. She is scheduled for subsequent ligation procedure on 03/25/2018.  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  HCV RNA was not detected. She had a secondary liver disease workup which revealed weakly positive anti-smooth muscle antibodies.  ANA was positive (> 1:1280 centromere antibody).  Negative studies included: ceruloplasmin, anti-mitochondrial antibodies, hepatitis B, HIV, and alpha-1 antitrypsin.  AFP was normal.  PT was 15.2 (INR 1.21).  CBC on 02/09/2018 revealed a hematocrit of 22.5, hemoglobin 6.8, MCV 63.9, platelets 305,000, and WBC 9500.  Retic was 1.7%.  She was transfused with 3 units of PRBCs.  CBC on 02/12/2018 revealed a hematocrit of 26.6 and hemoglobin 8.2.  Ferritin was 6.  Iron saturation was 6% with a TIBC of 313.  B12 was 103.  Folate was 14.3.  She has severe iron deficiency and B12 deficiency.  She was discharged on oral iron and oral B12 supplementation. She was supposed to start nadolol but she did not get the prescription filled due to high cost.  She was switched to propranolol 20 mg BID.  Abdomen and pelvic CT on 02/09/2018 revealed an 18 mm spiculated density seen in right lower lobe concerning for malignancy. Chest CT with intravenous contrast was recommended.  There was mildly nodular hepatic contours  concerning for hepatic cirrhosis.  There was moderate splenomegaly suggesting portal venous hypertension.  There was wall thickening of several loops of small bowel in left lower quadrant concerning for enteritis.  Mild ascites was noted.  There were mildly enlarged retroperitoneal lymph nodes (largest 1.1 cm) concerning for possible metastatic disease or malignancy.  There was a 7 cm left ovarian cyst. Further evaluation with MRI was recommended to evaluate for possible neoplasm.  Patient denies any bleeding; no hematochezia, melena, or gross hematuria. Patient is currently having black stools, which is attributed to oral iron TID. She has constipation, for which she uses Miralax. Patient notes that the Miralax is effective in managing her constipation. Patient is currently on oral B12 1,000 mcg daily. Diet is good.  Patient started craving ice about 2 months before her hospital admission. Ice pica has since resolved.  Patient denies any family history that is significant for any type of oncologic or hematologic disorders.    Past Medical History:  Diagnosis Date  . Anemia    TAKES IRON TAB  . Arthritis    SHOULDER  . Bronchitis    HX OF  . Diabetes mellitus without complication ([place])    TYPE 2  . Full dentures    UPPER AND LOWER    Past Surgical History:  Procedure Laterality Date  . CATARACT EXTRACTION W/PHACO Right 02/06/2018   Procedure: CATARACT EXTRACTION PHACO AND INTRAOCULAR LENS PLACEMENT ([place]) RIGHT DIABETIC;  Surgeon: Leandrew Koyanagi, MD;  Location: [place];  Service: Ophthalmology;  Laterality: Right;  DIABETIC, ORAL MED  . DENTAL SURGERY     EXTRACTIONS  . ESOPHAGOGASTRODUODENOSCOPY (EGD) WITH PROPOFOL N/A 02/10/2018   Procedure: ESOPHAGOGASTRODUODENOSCOPY (EGD) WITH PROPOFOL;  Surgeon: Jonathon Bellows, MD;  Location: [facility] ENDOSCOPY;  Service: Gastroenterology;  Laterality: N/A;    Family History  Problem Relation Age of Onset  . CAD Father   . Cancer  Maternal Uncle     Social History:  reports that she has never smoked. She has never used smokeless tobacco. She reports that she does not drink alcohol. Her drug history is not on file.  Patient denies known exposures to radiation on toxins. She retired from [employer] in 11/2017.  The patient is accompanied by Telford Nab (nurse navigator) today.  Allergies: No Known Allergies  Current Medications: Current Outpatient Medications  Medication Sig Dispense Refill  . ferrous sulfate 325 (65 FE) MG tablet Take 1 tablet (325 mg total) by mouth daily. 30 tablet 3  . furosemide (LASIX) 20 MG tablet Take 1 tablet (20 mg total) by mouth daily. 90 tablet 0  . metFORMIN (GLUCOPHAGE) 1000 MG tablet Take 1,000 mg by mouth 2 (two) times daily with a meal. AM AND PM    . omeprazole (PRILOSEC) 40 MG capsule Take 1 capsule (40 mg total) by mouth daily. 30 capsule 0  . polyethylene glycol powder (GLYCOLAX/MIRALAX) powder Take 17 g by mouth 2 (two) times daily. 255 g 2  . propranolol (INDERAL) 20 MG tablet Take 1 tablet (20 mg total) by mouth 2 (two) times daily. 180 tablet 1  . spironolactone (ALDACTONE) 50 MG tablet Take 1 tablet (50 mg total) by mouth daily. 90 tablet 0  . sulfacetamide (BLEPH-10) 10 % ophthalmic solution PLACE 2 DROPS INTO AFFECTED EYE(S) EVERY TWO TO FOUR HOURS AS NEEDED    . vitamin B-12 1000 MCG tablet Take 1 tablet (1,000 mcg total) by mouth daily. 120 tablet 0   No current facility-administered medications for this visit.     Review of Systems:  GENERAL:  Feels "fine".  No fevers, sweats or weight loss. PERFORMANCE STATUS (ECOG):  1 HEENT:  Vision improved after recent right cataract surgery.  No runny nose, sore throat, mouth sores or tenderness. Lungs: No shortness of breath or cough.  No hemoptysis. Cardiac:  No chest pain, palpitations, orthopnea, or PND. GI:  Constipation; controlled on Miralax. Black stools due to iron. No nausea, vomiting, diarrhea,  melena or  hematochezia. GU:  No urgency, frequency, dysuria, or hematuria. Musculoskeletal:  No back pain.  No joint pain.  No muscle tenderness. Extremities:  Lower extremity edema (old); tender to touch (L>R).  Skin:  No rashes or skin changes. Neuro:  No headache, numbness or weakness, balance or coordination issues. Endocrine:  Diabetes.  No thyroid issues, hot flashes or night sweats. Psych:  No mood changes, depression or anxiety. Pain:  No focal pain. Review of systems:  All other systems reviewed and found to be negative.  Physical Exam: Blood pressure 119/72, pulse 66, temperature 98.6 F (37 C), temperature source Tympanic, resp. rate 18, height 5' 3" (1.6 m), weight 192 lb (87.1 kg), SpO2 99 %. GENERAL:  Well developed, well nourished, woman sitting comfortably in the exam room in no acute distress. MENTAL STATUS:  Alert and oriented to person, place and time. HEAD:  White hair.  Normocephalic, atraumatic, face symmetric, no Cushingoid features. EYES:  Glasses.  Blue eyes.  Pupils equal round and reactive to light and accomodation.  No conjunctivitis or scleral icterus. ENT:  Oropharynx clear without lesion.  Tongue normal. Mucous membranes moist.  RESPIRATORY:  Clear to auscultation without rales, wheezes or rhonchi. CARDIOVASCULAR:  Regular rate and rhythm without murmur, rub or gallop. ABDOMEN:  Splenomegaly. Shifting dullness.  Soft, non-tender, with active bowel sounds, and no appreciable hepatomegaly.  No masses. SKIN:  Scattered facial macular red spots.  No rashes, ulcers or lesions. EXTREMITIES:  Chronic 2+ bilateral lower extremity edema.  No skin discoloration.  Mild tenderness.  No palpable cords. LYMPH NODES: No palpable cervical, supraclavicular, axillary or inguinal adenopathy  NEUROLOGICAL: Unremarkable. PSYCH:  Appropriate.   No visits with results within 3 Day(s) from this visit.  Latest known visit with results is:  Admission on 02/09/2018, Discharged on 02/12/2018   No results displayed because visit has over 200 results.      Assessment:  Crystal [name] is a 67 y.o. female with a right lower lobe pulmonary nodule.  She denies any smoking history.  Abdomen and pelvic CT on 02/09/2018 revealed an 1.8 cm spiculated density in right lower lobe concerning for malignancy. There was mildly nodular hepatic contour concerning for hepatic cirrhosis.  There was moderate splenomegaly suggesting portal venous hypertension.  Mild ascites was noted.  There were mildly enlarged retroperitoneal lymph nodes (largest 1.1 cm) concerning for possible metastatic disease or malignancy.  There was a 7 cm left ovarian cyst.  Further evaluation with MRI was recommended to evaluate for possible neoplasm.  She was admitted to [facility] from 02/09/2018 - 02/12/2018 with a variceal hemorrhage.  EGD on 02/10/2018 revealed 5 columns of grade III esophageal varices in the lower third of the esophagus.  There was stigmata of recent bleeding and red wale signs.  She underwent variceal ligation x 10.   She required 3 units of PRBCs.  She has iron deficiency anemia.  Ferritin was 6.  She is on ferrous sulfate 325 mg TID.  She has B12 deficiency.  B12 was 103.  She is on oral B12.  Diet is good.  She has hepatitis C antibodies.  HCV RNA was not detected. Anti-smooth muscle antibodies are weakly positive.  ANA was positive (> 1:1280 centromere antibody).  Negative studies included: ceruloplasmin, anti-mitochondrial antibodies, hepatitis B, HIV, and alpha-1 antitrypsin.  AFP was normal.  PT was 15.2 (INR 1.21).  Symptomatically, she feels fine.  Exam reveals splenomegaly, ascites, and lower extremity edema.  Plan: 1.  Discuss abdomen and pelvic CT from 02/09/2018.  18 mm spiculated density seen in right lower lobe concerning for malignancy (primary versus metastatic lesion).  Left ovarian cyst of unclear etiology (? malignant).  Retroperitoneal lymph nodes of uncertain significance. 2.  Labs today:   CBC with diff, retic, CEA, CA125, hepatitis B core antibody total, anti-parietal antibody, intrinsic factor antibody. 3.  Schedule chest CT with contrast.  4.  Anticipate PET scan following chest CT imaging.  Biopsy based on further imaging.  5.  Continue to follow up with Dr. Marius Ditch (GI) as already scheduled. Patient scheduled for another variceal ligation procedure on 03/25/2018. 6.  RTC after CT and PET - will call for an appointment.    Honor Loh, NP  02/18/2018, 3:27 PM   I saw and evaluated the patient, participating in the [place] portions of the service and reviewing pertinent diagnostic studies and records.  I reviewed the nurse practitioner's note and agree with the findings and the plan.  The assessment and plan were discussed with the patient.  Multiple questions were asked by the patient and answered.  Nolon Stalls, MD 02/18/2018, 3:27 PM

## 2018-02-18 NOTE — Progress Notes (Signed)
gastro

## 2018-02-19 NOTE — Progress Notes (Signed)
  Oncology Nurse Navigator Documentation  Navigator Location: [facility]-Med Onc (02/18/18 1630)   )Navigator Encounter Type: Initial MedOnc (02/18/18 1630)                       Treatment Phase: Abnormal Scans (02/18/18 1630) Barriers/Navigation Needs: Coordination of Care (02/18/18 1630)   Interventions: Coordination of Care (02/18/18 1630)   Coordination of Care: Appts;Radiology (02/18/18 1630)         met with patient during initial consultation with Dr. Mike Gip to review recent CT scan and discuss next steps. All questions answered at the time of visit. Reviewed upcoming appts with patient. Contact info given and instructed to call with any further questions or needs. Pt verbalized understanding. Nothing further needed at this time.          Time Spent with Patient: 60 (02/18/18 1630)

## 2018-02-21 ENCOUNTER — Other Ambulatory Visit
Admission: RE | Admit: 2018-02-21 | Discharge: 2018-02-21 | Disposition: A | Discharge status: Home / Self Care | Payer: Medicare HMO | Source: Ambulatory Visit | Attending: Hematology and Oncology | Admitting: Hematology and Oncology

## 2018-02-21 ENCOUNTER — Other Ambulatory Visit
Admission: RE | Admit: 2018-02-21 | Discharge: 2018-02-21 | Disposition: A | Discharge status: Home / Self Care | Payer: Medicare HMO | Source: Ambulatory Visit | Attending: Gastroenterology | Admitting: Gastroenterology

## 2018-02-21 DIAGNOSIS — B1920 Unspecified viral hepatitis C without hepatic coma: Secondary | ICD-10-CM

## 2018-02-21 DIAGNOSIS — D500 Iron deficiency anemia secondary to blood loss (chronic): Secondary | ICD-10-CM

## 2018-02-21 DIAGNOSIS — Z1389 Encounter for screening for other disorder: Secondary | ICD-10-CM | POA: Diagnosis not present

## 2018-02-21 DIAGNOSIS — E538 Deficiency of other specified B group vitamins: Secondary | ICD-10-CM | POA: Diagnosis present

## 2018-02-21 DIAGNOSIS — R591 Generalized enlarged lymph nodes: Secondary | ICD-10-CM | POA: Insufficient documentation

## 2018-02-21 DIAGNOSIS — E669 Obesity, unspecified: Secondary | ICD-10-CM | POA: Diagnosis not present

## 2018-02-21 DIAGNOSIS — N83202 Unspecified ovarian cyst, left side: Secondary | ICD-10-CM

## 2018-02-21 DIAGNOSIS — R768 Other specified abnormal immunological findings in serum: Secondary | ICD-10-CM | POA: Diagnosis present

## 2018-02-21 DIAGNOSIS — R911 Solitary pulmonary nodule: Secondary | ICD-10-CM | POA: Insufficient documentation

## 2018-02-21 DIAGNOSIS — R599 Enlarged lymph nodes, unspecified: Secondary | ICD-10-CM

## 2018-02-21 DIAGNOSIS — E119 Type 2 diabetes mellitus without complications: Secondary | ICD-10-CM | POA: Diagnosis not present

## 2018-02-21 DIAGNOSIS — K922 Gastrointestinal hemorrhage, unspecified: Secondary | ICD-10-CM | POA: Diagnosis not present

## 2018-02-21 DIAGNOSIS — L040 Acute lymphadenitis of face, head and neck: Secondary | ICD-10-CM | POA: Diagnosis not present

## 2018-02-21 DIAGNOSIS — I898 Other specified noninfective disorders of lymphatic vessels and lymph nodes: Secondary | ICD-10-CM | POA: Diagnosis not present

## 2018-02-21 LAB — RETICULOCYTES
RBC.: 4.02 MIL/uL (ref 3.80–5.20)
Retic Count, Absolute: 84.4 10*3/uL (ref 19.0–183.0)
Retic Ct Pct: 2.1 % (ref 0.4–3.1)

## 2018-02-22 ENCOUNTER — Other Ambulatory Visit: Payer: Self-pay | Admitting: Urgent Care

## 2018-02-22 ENCOUNTER — Telehealth: Payer: Self-pay | Admitting: *Deleted

## 2018-02-22 ENCOUNTER — Other Ambulatory Visit: Payer: Self-pay

## 2018-02-22 ENCOUNTER — Ambulatory Visit: Payer: Medicare HMO | Admitting: Gastroenterology

## 2018-02-22 ENCOUNTER — Other Ambulatory Visit: Payer: Self-pay | Admitting: *Deleted

## 2018-02-22 ENCOUNTER — Encounter: Payer: Self-pay | Admitting: Gastroenterology

## 2018-02-22 VITALS — BP 146/74 | HR 56 | Ht 63.0 in | Wt 189.0 lb

## 2018-02-22 DIAGNOSIS — R971 Elevated cancer antigen 125 [CA 125]: Secondary | ICD-10-CM

## 2018-02-22 DIAGNOSIS — K7460 Unspecified cirrhosis of liver: Secondary | ICD-10-CM | POA: Diagnosis not present

## 2018-02-22 DIAGNOSIS — R188 Other ascites: Secondary | ICD-10-CM

## 2018-02-22 DIAGNOSIS — D500 Iron deficiency anemia secondary to blood loss (chronic): Secondary | ICD-10-CM | POA: Diagnosis not present

## 2018-02-22 DIAGNOSIS — R590 Localized enlarged lymph nodes: Secondary | ICD-10-CM

## 2018-02-22 DIAGNOSIS — D519 Vitamin B12 deficiency anemia, unspecified: Secondary | ICD-10-CM

## 2018-02-22 DIAGNOSIS — I8510 Secondary esophageal varices without bleeding: Secondary | ICD-10-CM

## 2018-02-22 DIAGNOSIS — K7581 Nonalcoholic steatohepatitis (NASH): Secondary | ICD-10-CM | POA: Diagnosis not present

## 2018-02-22 DIAGNOSIS — N83202 Unspecified ovarian cyst, left side: Secondary | ICD-10-CM

## 2018-02-22 DIAGNOSIS — D510 Vitamin B12 deficiency anemia due to intrinsic factor deficiency: Secondary | ICD-10-CM

## 2018-02-22 HISTORY — DX: Vitamin B12 deficiency anemia due to intrinsic factor deficiency: D51.0

## 2018-02-22 LAB — CA 125: Cancer Antigen (CA) 125: 351.3 U/mL — ABNORMAL HIGH (ref 0.0–38.1)

## 2018-02-22 LAB — ANTI-PARIETAL ANTIBODY: Parietal Cell Antibody-IgG: 45.6 Units — ABNORMAL HIGH (ref 0.0–20.0)

## 2018-02-22 LAB — INTRINSIC FACTOR ANTIBODIES: Intrinsic Factor: 1 AU/mL (ref 0.0–1.1)

## 2018-02-22 LAB — CEA: CEA: 1.1 ng/mL (ref 0.0–4.7)

## 2018-02-22 LAB — HEPATITIS B CORE ANTIBODY, TOTAL: Hep B Core Total Ab: POSITIVE — AB

## 2018-02-22 NOTE — Progress Notes (Signed)
[name], MD [address]  [place]  [place], [place] [address]  Main: [phone]  Fax: [phone]    Gastroenterology Consultation  Referring Provider:     Lorelee Market, MD Primary Care Physician:  Lorelee Market, MD Primary Gastroenterologist:  Dr. [name] Reason for Consultation:     Decompensated cirrhosis        HPI:   Crystal [name] is a 67 y.o. female referred by Dr. Lorelee Market, MD  for consultation & management of decompensated cirrhosis. Patient was recently diagnosed with decompensated cirrhosis, when she presented to [facility] on 02/09/2018 with large volume hematemesis secondary to variceal hemorrhage, underwent upper endoscopy on 02/10/2018 by Dr. Vicente Males, found to have large esophageal varices with red wale signs and underwent variceal ligation x 10. Her bleeding resolved by the time of discharge. Patient was found to have hepatitis C antibodies and HCV virus was not detected. She had other secondary liver disease workup which revealed weakly positive anti-smooth muscle antibodies. She has long-standing history of diabetes, on metformin. She has severe iron deficiency and B12 deficiency, discharged on oral iron and oral B12 supplementation. She was supposed to start nadolol but she did not get prescription filled due to high cost. Patient was just discharged from the hospital 2 days ago  Interval summary: since discharge, patient has been doing fairly well. She denies hematemesis, melena, rectal bleeding. She reports worsening of abdominal distention and swelling of legs which started prior to hospitalization. She is not on diuretics. She is taking iron and B12 supplements, omeprazole.she reports her stools are dark but formed. She wants to know if she can restart drinking Coke. She is not on a low-sodium diet. She denies history of heavy smoking or heavy alcohol use in the past. She denies easy bruising, altered  sleep cycle   Follow-up visit 02/22/2018 Patient is seen by Dr. Mike Gip on it [place] for lung nodule as well as retroperitoneal lymphadenopathy. Workup is in progress. Patient is here for follow-up with regards to her decompensated cirrhosis. She is adhering to medications that I recommended during last visit. She is taking Lasix 20 mg, spironolactone 50 mg, propranolol 20 mg twice a day.She is adhering to a low-salt diet, denies swelling of legs. She has developed cellulitis in her left lower leg and is being started on antibiotic. She says her abdominal distention is improving. She denies melena, hematochezia, hematemesis. She is taking oral iron 3 pills daily and vitamin B12 daily by mouth. She reports that she has been constipated and taking Colace. She otherwise denies any complaints today. She said she had given blood samples for the labs that I ordered last week but I do not see any results. We're calling the lab to find out the status  She is single, lives with a roommate  NSAIDs: none including BC powder or Goody powder  Antiplts/Anticoagulants/Anti thrombotics: none  GI Procedures:  Did not have a colonoscopy in the past EGD 02/09/18 The examined duodenum was normal. The stomach was normal. Five columns of non-bleeding grade III varices were found in the lower third of the esophagus,. Stigmata of recent bleeding were evident and red wale signs were present. Ten bands were successfully placed with incomplete eradication of varices. There was no bleeding during, and at the end, of the procedure.  Past Medical History:  Diagnosis Date  . Anemia    TAKES IRON TAB  . Arthritis    SHOULDER  . Bronchitis  HX OF  . Diabetes mellitus without complication (HCC)    TYPE 2  . Full dentures    UPPER AND LOWER    Past Surgical History:  Procedure Laterality Date  . CATARACT EXTRACTION W/PHACO Right 02/06/2018   Procedure: CATARACT EXTRACTION PHACO AND INTRAOCULAR LENS  PLACEMENT ([place]) RIGHT DIABETIC;  Surgeon: Leandrew Koyanagi, MD;  Location: [place];  Service: Ophthalmology;  Laterality: Right;  DIABETIC, ORAL MED  . DENTAL SURGERY     EXTRACTIONS  . ESOPHAGOGASTRODUODENOSCOPY (EGD) WITH PROPOFOL N/A 02/10/2018   Procedure: ESOPHAGOGASTRODUODENOSCOPY (EGD) WITH PROPOFOL;  Surgeon: Jonathon Bellows, MD;  Location: [facility] ENDOSCOPY;  Service: Gastroenterology;  Laterality: N/A;     Current Outpatient Medications:  .  ferrous sulfate 325 (65 FE) MG tablet, Take 1 tablet (325 mg total) by mouth daily., Disp: 30 tablet, Rfl: 3 .  furosemide (LASIX) 20 MG tablet, Take 1 tablet (20 mg total) by mouth daily., Disp: 90 tablet, Rfl: 0 .  metFORMIN (GLUCOPHAGE) 1000 MG tablet, Take 1,000 mg by mouth 2 (two) times daily with a meal. AM AND PM, Disp: , Rfl:  .  omeprazole (PRILOSEC) 40 MG capsule, Take 1 capsule (40 mg total) by mouth daily., Disp: 30 capsule, Rfl: 0 .  polyethylene glycol powder (GLYCOLAX/MIRALAX) powder, Take 17 g by mouth 2 (two) times daily., Disp: 255 g, Rfl: 2 .  prednisoLONE acetate (PRED FORTE) 1 % ophthalmic suspension, INSTILL ONE DROP TO OPERATIVE TWICE DAILY FOR 2 WEEKS THEN ONCE DAILY FOR 1 WEEK, Disp: , Rfl: 0 .  propranolol (INDERAL) 20 MG tablet, Take 1 tablet (20 mg total) by mouth 2 (two) times daily., Disp: 180 tablet, Rfl: 1 .  spironolactone (ALDACTONE) 50 MG tablet, Take 1 tablet (50 mg total) by mouth daily., Disp: 90 tablet, Rfl: 0 .  sulfacetamide (BLEPH-10) 10 % ophthalmic solution, PLACE 2 DROPS INTO AFFECTED EYE(S) EVERY TWO TO FOUR HOURS AS NEEDED, Disp: , Rfl:  .  vitamin B-12 1000 MCG tablet, Take 1 tablet (1,000 mcg total) by mouth daily., Disp: 120 tablet, Rfl: 0   Family History  Problem Relation Age of Onset  . CAD Father   . Cancer Maternal Uncle      Social History   Tobacco Use  . Smoking status: Never Smoker  . Smokeless tobacco: Never Used  Substance Use Topics  . Alcohol use: No    Frequency:  Never  . Drug use: Not on file    Allergies as of 02/22/2018  . (No Known Allergies)    Review of Systems:    All systems reviewed and negative except where noted in HPI.   Physical Exam:  BP (!) 146/74   Pulse (!) 56   Ht 5' 3" (1.6 m)   Wt 189 lb (85.7 kg)   BMI 33.48 kg/m  No LMP recorded. Patient is postmenopausal.  General:   Alert,  Well-developed, well-nourished, pleasant and cooperative in NAD Head:  Normocephalic and atraumatic. Eyes:  Sclera clear, no icterus.   Conjunctiva pink. Ears:  Normal auditory acuity. Nose:  No deformity, discharge, or lesions. Mouth:  No deformity or lesions,oropharynx pink & moist. Neck:  Supple; no masses or thyromegaly. Lungs:  Respirations even and unlabored.  Clear throughout to auscultation.   No wheezes, crackles, or rhonchi. No acute distress. Heart:  Regular rate and rhythm; no murmurs, clicks, rubs, or gallops. Abdomen:  Normal bowel sounds. Soft, non-tender and diffusely distended, moderate, dull to percussion secondary to ascites, without masses, hepatosplenomegaly or  hernias noted.  No guarding or rebound tenderness.   Rectal: Not performed Msk:  Symmetrical without gross deformities. muscle wasting in bilateral upper extremities Good, equal movement & strength bilaterally. Pulses:  Normal pulses noted. Extremities:  No clubbing, 3+edema in left lower leg associated with tenderness and erythema, 2+ edema in right lower leg.  No cyanosis. Neurologic:  Alert and oriented x3;  grossly normal neurologically. Skin: telangiectasias on upper anterior chest, No jaundice. Lymph Nodes:  No significant cervical adenopathy. Psych:  Alert and cooperative. Normal mood and affect.  Imaging Studies: Reviewed. CT A/P with contrast on 02/09/2018 revealed mildly enlarged retroperitoneal lymph nodes, largest with 11 mm concerning for metastatic disease or malignancy,18 mm spiculated density in the right lower lobe concerning for malignancy  associated with small left pleural effusion  Assessment and Plan:   Danisha Brassfield Elk is a 67 y.o. Caucasian female with obesity, diabetes on metformin with new diagnosis of decompensated cirrhosis secondary to ascites and variceal bleed from esophageal varices status post variceal ligation on 02/10/2018 seen in consultation for further management.   Decompensated cirrhosis: child class B, MELD-Na 9 Secondary liver disease workup positive for HCV antibodies, but viral load not detected, anti-smooth muscle antibodies weakly positive. 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 Ceruloplasmin, antimitochondrial antibodies, hepatitis B, alpha-1 antitrypsin came back negative Portal hypertension: manifested as ascites, esophageal varices, splenomegaly Ascites/volume overload: associated with bilateral swelling of legs. Continue furosemide 20 mg and spironolactone 50 mg daily. To check status on BMP and magnesium, if not done, recommend recheck labs ASAP to adjust diuretics. Educated her about low-sodium diet. Avoid processed meats, and foods, chips, carbonated beverages. Provided her with educational material about low-sodium diet during initial visit Varices: variceal bleed from esophageal varices, status post EGD on 02/10/2018, [id number] Switched from Nadolol to propranolol 20 mg twice a day due to her insurance and adjust the dose based on heart rate. Her heart rate is currently at goal, continue current dose Repeat EGD in 4 weeks for variceal surveillance and banding if necessary She will be a good candidate for TIPS if she develops recurrent variceal bleed Continue omeprazole daily Anemia: she has developed severe iron deficiency anemia secondary to variceal hemorrhage. Hemoglobin stable at the time of discharge. Follow up on labs,continue oral iron 3 pills daily. She will probably benefit from parenteral iron therapy. Also, has B12 deficiency, continue oral  B12 1040mg daily. There is no evidence of coagulopathy or thrombocytopenia HCC screening: AFP normal, no liver lesions on CT in 01/2018. Recheck AFP and ultrasound liver in 6 months PSE: none HRS: none Hepatic hydrothorax: none  Health maintenance:  She is immune to hepatitis B, check hepatitis A IgG Check vitamin D levels, pending from last week Avoid excess Tylenol and NSAIDs use Colon cancer screening: recommend colonoscopy along with EGD Right lower lobe lung lesion and retroperitoneal lymphadenopathy: follow-up with Dr. CMike Gip evaluation in progress  Follow up in 2 weeks   RCephas Darby MD

## 2018-02-22 NOTE — Telephone Encounter (Signed)
Pt informed of pelvic MRI scheduled on 4/17. Instructed to arrive at medical mall at 2pm. Nothing further needed at this time.

## 2018-02-23 ENCOUNTER — Other Ambulatory Visit: Payer: Self-pay | Admitting: Hematology and Oncology

## 2018-02-25 ENCOUNTER — Other Ambulatory Visit: Payer: Self-pay

## 2018-02-25 ENCOUNTER — Telehealth: Payer: Self-pay | Admitting: Gastroenterology

## 2018-02-25 ENCOUNTER — Other Ambulatory Visit
Admission: RE | Admit: 2018-02-25 | Discharge: 2018-02-25 | Disposition: A | Discharge status: Home / Self Care | Payer: Medicare HMO | Source: Ambulatory Visit | Attending: Gastroenterology | Admitting: Gastroenterology

## 2018-02-25 ENCOUNTER — Telehealth: Payer: Self-pay | Admitting: *Deleted

## 2018-02-25 ENCOUNTER — Encounter: Payer: Self-pay | Admitting: Urgent Care

## 2018-02-25 ENCOUNTER — Other Ambulatory Visit: Payer: Self-pay | Admitting: Urgent Care

## 2018-02-25 ENCOUNTER — Ambulatory Visit
Admission: RE | Admit: 2018-02-25 | Discharge: 2018-02-25 | Disposition: A | Discharge status: Home / Self Care | Payer: Medicare HMO | Source: Ambulatory Visit | Attending: Urgent Care | Admitting: Urgent Care

## 2018-02-25 DIAGNOSIS — K922 Gastrointestinal hemorrhage, unspecified: Secondary | ICD-10-CM | POA: Insufficient documentation

## 2018-02-25 DIAGNOSIS — R911 Solitary pulmonary nodule: Secondary | ICD-10-CM

## 2018-02-25 DIAGNOSIS — D500 Iron deficiency anemia secondary to blood loss (chronic): Secondary | ICD-10-CM | POA: Insufficient documentation

## 2018-02-25 DIAGNOSIS — I8511 Secondary esophageal varices with bleeding: Secondary | ICD-10-CM | POA: Diagnosis not present

## 2018-02-25 DIAGNOSIS — R188 Other ascites: Secondary | ICD-10-CM | POA: Insufficient documentation

## 2018-02-25 DIAGNOSIS — R161 Splenomegaly, not elsewhere classified: Secondary | ICD-10-CM | POA: Diagnosis not present

## 2018-02-25 DIAGNOSIS — I700 Atherosclerosis of aorta: Secondary | ICD-10-CM | POA: Diagnosis not present

## 2018-02-25 DIAGNOSIS — K7460 Unspecified cirrhosis of liver: Secondary | ICD-10-CM | POA: Diagnosis not present

## 2018-02-25 DIAGNOSIS — I2510 Atherosclerotic heart disease of native coronary artery without angina pectoris: Secondary | ICD-10-CM | POA: Diagnosis not present

## 2018-02-25 DIAGNOSIS — J90 Pleural effusion, not elsewhere classified: Secondary | ICD-10-CM | POA: Diagnosis not present

## 2018-02-25 DIAGNOSIS — E1169 Type 2 diabetes mellitus with other specified complication: Secondary | ICD-10-CM | POA: Diagnosis present

## 2018-02-25 LAB — CBC WITH DIFFERENTIAL/PLATELET
Basophils Absolute: 0 10*3/uL (ref 0–0.1)
Basophils Relative: 1 %
Eosinophils Absolute: 0.1 10*3/uL (ref 0–0.7)
Eosinophils Relative: 2 %
HCT: 33 % — ABNORMAL LOW (ref 35.0–47.0)
Hemoglobin: 10.5 g/dL — ABNORMAL LOW (ref 12.0–16.0)
Lymphocytes Relative: 24 %
Lymphs Abs: 0.8 10*3/uL — ABNORMAL LOW (ref 1.0–3.6)
MCH: 24.3 pg — ABNORMAL LOW (ref 26.0–34.0)
MCHC: 32 g/dL (ref 32.0–36.0)
MCV: 76.2 fL — ABNORMAL LOW (ref 80.0–100.0)
Monocytes Absolute: 0.3 10*3/uL (ref 0.2–0.9)
Monocytes Relative: 8 %
Neutro Abs: 2.2 10*3/uL (ref 1.4–6.5)
Neutrophils Relative %: 65 %
Platelets: 198 10*3/uL (ref 150–440)
RBC: 4.33 MIL/uL (ref 3.80–5.20)
RDW: 29.6 % — ABNORMAL HIGH (ref 11.5–14.5)
WBC: 3.4 10*3/uL — ABNORMAL LOW (ref 3.6–11.0)

## 2018-02-25 LAB — COMPREHENSIVE METABOLIC PANEL
ALT: 14 U/L (ref 14–54)
AST: 30 U/L (ref 15–41)
Albumin: 2.9 g/dL — ABNORMAL LOW (ref 3.5–5.0)
Alkaline Phosphatase: 177 U/L — ABNORMAL HIGH (ref 38–126)
Anion gap: 9 (ref 5–15)
BUN: 6 mg/dL (ref 6–20)
CO2: 26 mmol/L (ref 22–32)
Calcium: 8.6 mg/dL — ABNORMAL LOW (ref 8.9–10.3)
Chloride: 103 mmol/L (ref 101–111)
Creatinine, Ser: 0.55 mg/dL (ref 0.44–1.00)
GFR calc Af Amer: >60 mL/min (ref >60)
GFR calc non Af Amer: >60 mL/min (ref >60)
Glucose, Bld: 101 mg/dL — ABNORMAL HIGH (ref 65–99)
Potassium: 3.1 mmol/L — ABNORMAL LOW (ref 3.5–5.1)
Sodium: 138 mmol/L (ref 135–145)
Total Bilirubin: 1 mg/dL (ref 0.3–1.2)
Total Protein: 7.2 g/dL (ref 6.5–8.1)

## 2018-02-25 LAB — PROTIME-INR
INR: 1.18
Prothrombin Time: 14.9 seconds (ref 11.4–15.2)

## 2018-02-25 LAB — MAGNESIUM: Magnesium: 1.8 mg/dL (ref 1.7–2.4)

## 2018-02-25 LAB — HEMOGLOBIN A1C
Hgb A1c MFr Bld: 5.1 % (ref 4.8–5.6)
Mean Plasma Glucose: 99.67 mg/dL

## 2018-02-25 MED ORDER — IOHEXOL 300 MG/ML  SOLN
75.0000 mL | Freq: Once | INTRAMUSCULAR | Status: AC | PRN
  Administered 2018-02-25: 75 mL via INTRAVENOUS

## 2018-02-25 MED ORDER — POTASSIUM CHLORIDE CRYS ER 20 MEQ PO TBCR: 40.0000 meq | EXTENDED_RELEASE_TABLET | Freq: Every day | ORAL | 0 refills | Status: DC

## 2018-02-25 NOTE — Telephone Encounter (Signed)
CT results reviewed.  I have asked scheduling to cancel the scheduled MRI. She has other findings that need to be addressed, therefore we will proceed with PET imaging as discussed in clinic. This will pick up the 2 nodules in the RIGHT lung, the upper abdominal adenopathy, and will further evaluate the LEFT ovary finding as being cystic vs. neoplastic. Orders for the PET have been entered. Awaiting financial approval, and subsequent scheduling of this study.   Crystal Loh, MSN, APRN, FNP-C, CEN Oncology/Hematology Nurse Practitioner  [facility] 02/25/18, 5:14 PM

## 2018-02-25 NOTE — Telephone Encounter (Signed)
Patient called and stated she had her blood work done and was wondering if you had results yet? Please call

## 2018-02-25 NOTE — Telephone Encounter (Signed)
Pt called in this morning to cancel MRI scheduled for Wednesday. States that she can not lay still for very long and does not feel like she can afford copay at this time. Informed pt that can find funding to help with copay for MRI but pt still insisted on cancelling MRI since she could not lay still for very long. Pt stated only wanted to proceed with CT chest today and wait for those results. Asked pt if she would like to reschedule MRI to later this week or next week and she stated that she would call me back to let me know when wants to reschedule.

## 2018-02-26 LAB — VITAMIN D 25 HYDROXY (VIT D DEFICIENCY, FRACTURES): Vit D, 25-Hydroxy: 7 ng/mL — ABNORMAL LOW (ref 30.0–100.0)

## 2018-02-26 NOTE — Telephone Encounter (Signed)
Pt has been made aware of CT results and MD recommendations. Informed that PET is scheduled on 4/24 but pt requested PET to be moved to another day since [birth year]. Spoke with PET dept and moved PET to [facility] 4/26. Pt informed of new appt for PET and reviewed prep for PET scan with patient. Pt voiced concern about copays and financial burden of many appts. Informed pt that will refer her to Elease Etienne to discuss financial concerns. Pt verbalized understanding and confirmed appts for PET 4/26 and follow up with Dr. Mike Gip on 5/2. Nothing further needed at this time.

## 2018-02-26 NOTE — Telephone Encounter (Signed)
Noted. I will inform the patient of CT findings and get her PET scheduled once approved through insurance.

## 2018-02-27 ENCOUNTER — Other Ambulatory Visit: Payer: Self-pay

## 2018-02-27 ENCOUNTER — Ambulatory Visit: Payer: Medicare HMO

## 2018-02-27 ENCOUNTER — Telehealth: Payer: Self-pay

## 2018-02-27 MED ORDER — ERGOCALCIFEROL 1.25 MG (50000 UT) PO CAPS: 50000.0000 [IU] | ORAL_CAPSULE | ORAL | 2 refills | Status: DC

## 2018-02-27 NOTE — Telephone Encounter (Signed)
-----  Message from Lin Landsman, MD sent at 02/26/2018  1:27 PM EDT ----- Please inform pt that she has severe Vit D deficiency, recommend prescription Vit D 50K units weekly for 3 months then daily Ca+VitD 500U  -RV

## 2018-02-27 NOTE — Progress Notes (Signed)
Patient notified rx faxed to pharmacy.

## 2018-02-27 NOTE — Telephone Encounter (Signed)
And, check BMP in 1week  -RV

## 2018-02-27 NOTE — Telephone Encounter (Signed)
Patient has been informed her Magnesium looks good.  No anemia, no diabetes, INR is good.  She has been asked to increase Lasix to 40 mg daily.  Increase Spironolactone to 100 mg daily and also begin taking potassium 40 MeQ  Daily.  Rx sent to pharmacy for potassium.  Thanks [employer]

## 2018-03-06 ENCOUNTER — Ambulatory Visit: Payer: Medicare HMO

## 2018-03-06 ENCOUNTER — Inpatient Hospital Stay: Payer: Medicare HMO

## 2018-03-06 DIAGNOSIS — R768 Other specified abnormal immunological findings in serum: Secondary | ICD-10-CM | POA: Diagnosis not present

## 2018-03-06 DIAGNOSIS — D500 Iron deficiency anemia secondary to blood loss (chronic): Secondary | ICD-10-CM | POA: Diagnosis not present

## 2018-03-06 DIAGNOSIS — E538 Deficiency of other specified B group vitamins: Secondary | ICD-10-CM | POA: Diagnosis not present

## 2018-03-06 DIAGNOSIS — N83202 Unspecified ovarian cyst, left side: Secondary | ICD-10-CM | POA: Diagnosis not present

## 2018-03-06 DIAGNOSIS — E119 Type 2 diabetes mellitus without complications: Secondary | ICD-10-CM | POA: Diagnosis not present

## 2018-03-06 DIAGNOSIS — R911 Solitary pulmonary nodule: Secondary | ICD-10-CM | POA: Diagnosis not present

## 2018-03-06 DIAGNOSIS — L040 Acute lymphadenitis of face, head and neck: Secondary | ICD-10-CM | POA: Diagnosis not present

## 2018-03-06 DIAGNOSIS — H6692 Otitis media, unspecified, left ear: Secondary | ICD-10-CM | POA: Diagnosis not present

## 2018-03-06 DIAGNOSIS — E669 Obesity, unspecified: Secondary | ICD-10-CM | POA: Diagnosis not present

## 2018-03-06 DIAGNOSIS — R591 Generalized enlarged lymph nodes: Secondary | ICD-10-CM | POA: Diagnosis not present

## 2018-03-06 MED ORDER — CYANOCOBALAMIN 1000 MCG/ML IJ SOLN
1000.0000 ug | Freq: Once | INTRAMUSCULAR | Status: AC
  Administered 2018-03-06: 1000 ug via INTRAMUSCULAR

## 2018-03-08 ENCOUNTER — Ambulatory Visit
Admission: RE | Admit: 2018-03-08 | Discharge: 2018-03-08 | Disposition: A | Discharge status: Home / Self Care | Payer: Medicare HMO | Source: Ambulatory Visit | Attending: Urgent Care | Admitting: Urgent Care

## 2018-03-08 DIAGNOSIS — R188 Other ascites: Secondary | ICD-10-CM | POA: Diagnosis not present

## 2018-03-08 DIAGNOSIS — R911 Solitary pulmonary nodule: Secondary | ICD-10-CM | POA: Diagnosis present

## 2018-03-08 DIAGNOSIS — K766 Portal hypertension: Secondary | ICD-10-CM | POA: Insufficient documentation

## 2018-03-08 DIAGNOSIS — K8020 Calculus of gallbladder without cholecystitis without obstruction: Secondary | ICD-10-CM | POA: Diagnosis not present

## 2018-03-08 DIAGNOSIS — K7460 Unspecified cirrhosis of liver: Secondary | ICD-10-CM | POA: Diagnosis not present

## 2018-03-08 DIAGNOSIS — R161 Splenomegaly, not elsewhere classified: Secondary | ICD-10-CM | POA: Diagnosis not present

## 2018-03-08 DIAGNOSIS — R590 Localized enlarged lymph nodes: Secondary | ICD-10-CM | POA: Insufficient documentation

## 2018-03-08 DIAGNOSIS — N838 Other noninflammatory disorders of ovary, fallopian tube and broad ligament: Secondary | ICD-10-CM | POA: Diagnosis not present

## 2018-03-08 DIAGNOSIS — R918 Other nonspecific abnormal finding of lung field: Secondary | ICD-10-CM | POA: Diagnosis not present

## 2018-03-08 LAB — GLUCOSE, CAPILLARY: Glucose-Capillary: 66 mg/dL (ref 65–99)

## 2018-03-08 MED ORDER — FLUDEOXYGLUCOSE F - 18 (FDG) INJECTION
9.4000 | Freq: Once | INTRAVENOUS | Status: AC | PRN
  Administered 2018-03-08: 9.4 via INTRAVENOUS

## 2018-03-12 DIAGNOSIS — H2512 Age-related nuclear cataract, left eye: Secondary | ICD-10-CM | POA: Diagnosis not present

## 2018-03-13 ENCOUNTER — Telehealth: Payer: Self-pay | Admitting: Gastroenterology

## 2018-03-13 ENCOUNTER — Ambulatory Visit: Payer: Medicare HMO

## 2018-03-13 NOTE — Telephone Encounter (Signed)
Pt left vm to see if  Dr. Marius Ditch  Could tell her what to take for indejestion

## 2018-03-14 ENCOUNTER — Inpatient Hospital Stay: Payer: Medicare HMO | Attending: Hematology and Oncology | Admitting: Hematology and Oncology

## 2018-03-14 ENCOUNTER — Inpatient Hospital Stay: Payer: Medicare HMO

## 2018-03-14 VITALS — BP 116/71 | HR 70 | Temp 97.7°F | Wt 161.4 lb

## 2018-03-14 DIAGNOSIS — D500 Iron deficiency anemia secondary to blood loss (chronic): Secondary | ICD-10-CM

## 2018-03-14 DIAGNOSIS — D510 Vitamin B12 deficiency anemia due to intrinsic factor deficiency: Secondary | ICD-10-CM | POA: Insufficient documentation

## 2018-03-14 DIAGNOSIS — Z7189 Other specified counseling: Secondary | ICD-10-CM | POA: Diagnosis not present

## 2018-03-14 DIAGNOSIS — R911 Solitary pulmonary nodule: Secondary | ICD-10-CM | POA: Insufficient documentation

## 2018-03-14 DIAGNOSIS — E538 Deficiency of other specified B group vitamins: Secondary | ICD-10-CM

## 2018-03-14 DIAGNOSIS — N949 Unspecified condition associated with female genital organs and menstrual cycle: Secondary | ICD-10-CM | POA: Diagnosis not present

## 2018-03-14 MED ORDER — CYANOCOBALAMIN 1000 MCG/ML IJ SOLN
1000.0000 ug | Freq: Once | INTRAMUSCULAR | Status: AC
  Administered 2018-03-14: 1000 ug via INTRAMUSCULAR
  Filled 2018-03-14: qty 1

## 2018-03-14 NOTE — Progress Notes (Signed)
Patient here today for PET results.

## 2018-03-14 NOTE — Progress Notes (Signed)
[facility] day:  03/14/2018  Chief Complaint: Crystal [name] is a 67 y.o. female with a pulmonary nodule and lymphadenopathy who seen for review of interval chest CT and PET scan and discussion regarding direction of therapy.  HPI:  The patient was last seen in the medical oncology clinic on 02/18/2018 after recent hospitalization.  She had been hospitalized secondary to hematemesis secondary to a variceal bleed.  Abdomen and pelvic CT scan had revealed a 1.8 cm spiculated lesion in the RLL.  She has iron deficiency anemia and B12 deficiency.  Labs from last visit included a hematocrit was 33.0, hemoglobin 10.5, MCV 76.2, platelets 198,000, WBC 7000 with an ANC of 2200.  Retic was 2.1%.  Albumin was 2.9, alkaline phosphatase 177,  AST 30, ALT 14, and bilirubin 1.0.  Intrinsic factor antibody was negative.  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  CEA was 1.1.  CA125 was 351.3.  Hepatitis B core antibody total was positive.  Chest CT on 02/25/2018 revealed a 2.3 x 1.5 x 1.0 cm perifissural nodule with spiculated margins in the right middle lobe.  There was a 1.5 x 1.5 x 1.4 cm right lower lobe spiculated nodule.  There was a moderate left sided pleural effusion.  There was suggestion of pulmonary artery hypertension.  There was suspected cirrhosis with stigmata of portal hypertension including ascites and splenomegaly.  PET scan on 03/08/2018 revealed a 2.5 x 1.8 cm sub solid nodular opacity in the RML (SUV 2.7) and a 1.6 cm rounded slightly spiculated RLL pulmonary nodule (SUV 2.1).  There were no enlarged or hypermetabolic mediastinal or hilar lymph nodes.  There was a small left pleural effusion.  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   During the interim, she remains fatigued. She thinks that she has over done it today running  errands. Patient denies bleeding; no hematochezia, melena, or gross hematuria. She has had a history of 2 days of indigestion despite her PPI. Patient has a PMH significant for volume overload due to decompensated cirrhosis. She is on loop and potassium sparing diuretic therapy. Patient has lost a significant amount of weight (31 pounds) since 02/18/2018.  Patient continues to have constipation, which has improved using Miralax. Stools are "always black" due to oral iron supplementation. She denies any B symptoms or interval infections.   Patient complains of generalized pain rated 3/10 today.   Past Medical History:  Diagnosis Date  . Anemia    TAKES IRON TAB  . Arthritis    SHOULDER  . Bronchitis    HX OF  . Diabetes mellitus without complication ([place])    TYPE 2  . Full dentures    UPPER AND LOWER    Past Surgical History:  Procedure Laterality Date  . CATARACT EXTRACTION W/PHACO Right 02/06/2018   Procedure: CATARACT EXTRACTION PHACO AND INTRAOCULAR LENS PLACEMENT ([place]) RIGHT DIABETIC;  Surgeon: Leandrew Koyanagi, MD;  Location: [facility];  Service: Ophthalmology;  Laterality: Right;  DIABETIC, ORAL MED  . DENTAL SURGERY     EXTRACTIONS  . ESOPHAGOGASTRODUODENOSCOPY (EGD) WITH PROPOFOL N/A 02/10/2018   Procedure: ESOPHAGOGASTRODUODENOSCOPY (EGD) WITH PROPOFOL;  Surgeon: Jonathon Bellows, MD;  Location[facility] ENDOSCOPY;  Service: Gastroenterology;  Laterality: N/A;    Family History  Problem Relation Age of Onset  . CAD Father   . Cancer Maternal Uncle     Social History:  reports that  she has never smoked. She has never used smokeless tobacco. She reports that she does not drink alcohol. Her drug history is not on file.  Patient denies known exposures to radiation on toxins. She retired from [employer] in 11/2017.  The patient is alone today.  Allergies: No Known Allergies  Current Medications: Current Outpatient Medications  Medication Sig Dispense Refill  .  ergocalciferol (DRISDOL) 50000 units capsule Take 1 capsule (50,000 Units total) by mouth once a week. 4 capsule 2  . ferrous sulfate 325 (65 FE) MG tablet Take 1 tablet (325 mg total) by mouth daily. 30 tablet 3  . furosemide (LASIX) 20 MG tablet Take 1 tablet (20 mg total) by mouth daily. 90 tablet 0  . metFORMIN (GLUCOPHAGE) 1000 MG tablet Take 1,000 mg by mouth 2 (two) times daily with a meal. AM AND PM    . omeprazole (PRILOSEC) 40 MG capsule Take 1 capsule (40 mg total) by mouth daily. 30 capsule 0  . polyethylene glycol powder (GLYCOLAX/MIRALAX) powder Take 17 g by mouth 2 (two) times daily. 255 g 2  . potassium chloride SA (K-DUR,KLOR-CON) 20 MEQ tablet Take 2 tablets (40 mEq total) by mouth daily. 60 tablet 0  . prednisoLONE acetate (PRED FORTE) 1 % ophthalmic suspension INSTILL ONE DROP TO OPERATIVE TWICE DAILY FOR 2 WEEKS THEN ONCE DAILY FOR 1 WEEK  0  . propranolol (INDERAL) 20 MG tablet Take 1 tablet (20 mg total) by mouth 2 (two) times daily. 180 tablet 1  . spironolactone (ALDACTONE) 50 MG tablet Take 1 tablet (50 mg total) by mouth daily. 90 tablet 0  . sulfacetamide (BLEPH-10) 10 % ophthalmic solution PLACE 2 DROPS INTO AFFECTED EYE(S) EVERY TWO TO FOUR HOURS AS NEEDED    . vitamin B-12 1000 MCG tablet Take 1 tablet (1,000 mcg total) by mouth daily. 120 tablet 0  . amoxicillin (AMOXIL) 500 MG capsule Take 500 mg by mouth 2 (two) times daily.  0   No current facility-administered medications for this visit.     Review of Systems:  GENERAL:  Fatigue.  No fevers, sweats.  Weight loss of 31 pounds sine 02/18/2018 (? real). PERFORMANCE STATUS (ECOG):  1 HEENT:  No visual changes, runny nose, sore throat, mouth sores or tenderness. Lungs: No shortness of breath or cough.  No hemoptysis. Cardiac:  No chest pain, palpitations, orthopnea, or PND. GI:  Indigestion.  Constipation on Miralax.  No nausea, vomiting, diarrhea, constipation, melena or hematochezia.  Dark stools on oral  iron. GU:  No urgency, frequency, dysuria, or hematuria. Musculoskeletal:  No back pain.  No joint pain.  No muscle tenderness. Extremities:  No pain.  Chronic lower extremity edema. Skin:  No rashes or skin changes. Neuro:  No headache, numbness or weakness, balance or coordination issues. Endocrine:  Diabetes.  No thyroid issues, hot flashes or night sweats. Psych:  No mood changes, depression or anxiety. Pain:  Generalized pain 3 out of 10. Review of systems:  All other systems reviewed and found to be negative.   Physical Exam: Blood pressure 116/71, pulse 70, temperature 97.7 F (36.5 C), temperature source Tympanic, weight 161 lb 6 oz (73.2 kg). GENERAL:  Well developed, well nourished, woman sitting comfortably in the exam room in no acute distress. MENTAL STATUS:  Alert and oriented to person, place and time. HEAD:  White hair.  Normocephalic, atraumatic, face symmetric, no Cushingoid features. EYES:  Glasses.  Blue eyes.  No conjunctivitis or scleral icterus. SKIN:  Scattered facial  red spots.  No rashes, ulcers or lesions. EXTREMITIES: Improved lower extremity edema. NEUROLOGICAL: Unremarkable. PSYCH:  Appropriate.    No visits with results within 3 Day(s) from this visit.  Latest known visit with results is:  Hospital Outpatient Visit on 03/08/2018  Component Date Value Ref Range Status  . Glucose-Capillary 03/08/2018 67  65 - 99 mg/dL Final    Assessment:  Crystal [name] is a 67 y.o. female with a right lower lobe pulmonary nodule.  She denies any smoking history.  Abdomen and pelvic CT on 02/09/2018 revealed an 1.8 cm spiculated density in right lower lobe concerning for malignancy. There was mildly nodular hepatic contour concerning for hepatic cirrhosis.  There was moderate splenomegaly suggesting portal venous hypertension.  Mild ascites was noted.  There were mildly enlarged retroperitoneal lymph nodes (largest 1.1 cm) concerning for possible metastatic disease or  malignancy.  There was a 7 cm left ovarian cyst.  Further evaluation with MRI was recommended to evaluate for possible neoplasm.  CA125 was 351.3 and CEA 1.1 on 02/21/2018.   Chest CT on 02/25/2018 revealed a 2.3 x 1.5 x 1.0 cm perifissural nodule with spiculated margins in the right middle lobe.  There was a 1.5 x 1.5 x 1.4 cm right lower lobe spiculated nodule.  There was a moderate left sided pleural effusion.    PET scan on 03/08/2018 revealed a 2.5 x 1.8 cm sub solid nodular opacity in the RML (SUV 2.7) and a 1.6 cm rounded slightly spiculated RLL pulmonary nodule (SUV 2.1).  There were no enlarged or hypermetabolic mediastinal or hilar lymph nodes.  There was a small left pleural effusion.  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  She was admitted to [facility] from 02/09/2018 - 02/12/2018 with a variceal hemorrhage.  EGD on 02/10/2018 revealed 5 columns of grade III esophageal varices in the lower third of the esophagus.  There was stigmata of recent bleeding and red wale signs.  She underwent variceal ligation x 10.   She required 3 units of PRBCs.  She has iron deficiency anemia.  Ferritin was 6.  She is on ferrous sulfate 325 mg TID.  She has B12 deficiency.  B12 was 103.   Intrinsic factor antibody was negative.  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  She began B12 injections on 03/06/2018.  Diet is good.  She has hepatitis C antibodies.  HCV RNA was not detected. She has been exposed to hepatitis B.  Anti-smooth muscle antibodies are weakly positive.  ANA was positive (> 1:1280 centromere antibody).  Negative studies included: ceruloplasmin, anti-mitochondrial antibodies, hepatitis B IgM, HIV, and alpha-1 antitrypsin.  AFP was normal.  PT was 15.2 (INR 1.21).  Symptomatically, she feels fine.  She has lost weight with ongoing diuresis.  She has stigmata of liver  disease.  Plan: 1.  Review interval chest CT and PET scan.  Images personally reviewed.  Mildly hypermetabolic lesions in the RML and RLL.  Etiology unclear- possible inflammatory nodules or low grade adenocarcinoma of the lung.  No mediastinal adenopathy.  No concern for retroperitoneal adenopathy. 2.  Review labs from labs visit.  Patient has been exposed to hepatitis B.  She has pernicious anemia.  She is now on B12 injections. 3.  Discuss plan to follow-up with Dr. Marius Ditch on 03/15/2018. She has a planned variceal ligation procedure on 03/25/2018. 4.  Discuss tumor board consensus. Patient presented with options of bronchoscopy  vs. reimaging in 1 month to revaluate RML and RLL lung nodules. Patient elects repeat chest CT imaging.  5.  Continue B12 injections (weekly #2 of 6), then monthly.  6.  Discuss 7 cm pelvic lesion.  Etiology unclear.  Schedule pelvic ultrasound. 7.  RTC in 1 month for MD assessment, review of chest CT and pelvic ultrasound, and discussion regarding direction of therapy.    Honor Loh, NP  03/14/2018, 2:22 PM   I saw and evaluated the patient, participating in the [place] portions of the service and reviewing pertinent diagnostic studies and records.  I reviewed the nurse practitioner's note and agree with the findings and the plan.  The assessment and plan were discussed with the patient.  Multiple questions were asked by the patient and answered.   Nolon Stalls, MD 03/14/2018, 2:22 PM

## 2018-03-15 ENCOUNTER — Other Ambulatory Visit
Admission: RE | Admit: 2018-03-15 | Discharge: 2018-03-15 | Disposition: A | Discharge status: Home / Self Care | Payer: Medicare HMO | Source: Ambulatory Visit | Attending: Gastroenterology | Admitting: Gastroenterology

## 2018-03-15 ENCOUNTER — Other Ambulatory Visit: Payer: Self-pay

## 2018-03-15 ENCOUNTER — Ambulatory Visit (INDEPENDENT_AMBULATORY_CARE_PROVIDER_SITE_OTHER): Payer: Medicare HMO | Admitting: Gastroenterology

## 2018-03-15 ENCOUNTER — Encounter: Payer: Self-pay | Admitting: Gastroenterology

## 2018-03-15 VITALS — BP 116/67 | Ht 63.0 in | Wt 162.2 lb

## 2018-03-15 DIAGNOSIS — D500 Iron deficiency anemia secondary to blood loss (chronic): Secondary | ICD-10-CM | POA: Insufficient documentation

## 2018-03-15 DIAGNOSIS — K7460 Unspecified cirrhosis of liver: Secondary | ICD-10-CM

## 2018-03-15 DIAGNOSIS — K922 Gastrointestinal hemorrhage, unspecified: Secondary | ICD-10-CM | POA: Insufficient documentation

## 2018-03-15 DIAGNOSIS — I8511 Secondary esophageal varices with bleeding: Secondary | ICD-10-CM | POA: Diagnosis present

## 2018-03-15 DIAGNOSIS — E1169 Type 2 diabetes mellitus with other specified complication: Secondary | ICD-10-CM | POA: Insufficient documentation

## 2018-03-15 DIAGNOSIS — R188 Other ascites: Secondary | ICD-10-CM | POA: Diagnosis present

## 2018-03-15 DIAGNOSIS — K7581 Nonalcoholic steatohepatitis (NASH): Secondary | ICD-10-CM | POA: Diagnosis not present

## 2018-03-15 LAB — MAGNESIUM: Magnesium: 1.9 mg/dL (ref 1.7–2.4)

## 2018-03-15 LAB — HEMOGLOBIN A1C
Hgb A1c MFr Bld: 5.6 % (ref 4.8–5.6)
Mean Plasma Glucose: 114.02 mg/dL

## 2018-03-15 LAB — CBC WITH DIFFERENTIAL/PLATELET
Basophils Absolute: 0.1 10*3/uL (ref 0–0.1)
Basophils Relative: 1 %
Eosinophils Absolute: 0.2 10*3/uL (ref 0–0.7)
Eosinophils Relative: 3 %
HCT: 35.5 % (ref 35.0–47.0)
Hemoglobin: 11.6 g/dL — ABNORMAL LOW (ref 12.0–16.0)
Lymphocytes Relative: 23 %
Lymphs Abs: 1.4 10*3/uL (ref 1.0–3.6)
MCH: 25.3 pg — ABNORMAL LOW (ref 26.0–34.0)
MCHC: 32.6 g/dL (ref 32.0–36.0)
MCV: 77.6 fL — ABNORMAL LOW (ref 80.0–100.0)
Monocytes Absolute: 0.6 10*3/uL (ref 0.2–0.9)
Monocytes Relative: 9 %
Neutro Abs: 4.1 10*3/uL (ref 1.4–6.5)
Neutrophils Relative %: 64 %
Platelets: 249 10*3/uL (ref 150–440)
RBC: 4.58 MIL/uL (ref 3.80–5.20)
RDW: 25.5 % — ABNORMAL HIGH (ref 11.5–14.5)
WBC: 6.3 10*3/uL (ref 3.6–11.0)

## 2018-03-15 LAB — COMPREHENSIVE METABOLIC PANEL
ALT: 20 U/L (ref 14–54)
AST: 36 U/L (ref 15–41)
Albumin: 3.6 g/dL (ref 3.5–5.0)
Alkaline Phosphatase: 241 U/L — ABNORMAL HIGH (ref 38–126)
Anion gap: 10 (ref 5–15)
BUN: 14 mg/dL (ref 6–20)
CO2: 25 mmol/L (ref 22–32)
Calcium: 9.5 mg/dL (ref 8.9–10.3)
Chloride: 95 mmol/L — ABNORMAL LOW (ref 101–111)
Creatinine, Ser: 0.93 mg/dL (ref 0.44–1.00)
GFR calc Af Amer: >60 mL/min (ref >60)
GFR calc non Af Amer: >60 mL/min (ref >60)
Glucose, Bld: 139 mg/dL — ABNORMAL HIGH (ref 65–99)
Potassium: 4.5 mmol/L (ref 3.5–5.1)
Sodium: 130 mmol/L — ABNORMAL LOW (ref 135–145)
Total Bilirubin: 0.8 mg/dL (ref 0.3–1.2)
Total Protein: 8.5 g/dL — ABNORMAL HIGH (ref 6.5–8.1)

## 2018-03-15 LAB — PROTIME-INR
INR: 1.09
Prothrombin Time: 14 seconds (ref 11.4–15.2)

## 2018-03-15 NOTE — Progress Notes (Signed)
[name], MD [address]  [place]  [place], [place] [address]  Main: [phone]  Fax: [phone]    Gastroenterology Consultation  Referring Provider:     Lorelee Market, MD Primary Care Physician:  Lorelee Market, MD Primary Gastroenterologist:  Dr. [name] Reason for Consultation:     Decompensated cirrhosis        HPI:   Crystal [name] is a 67 y.o. female referred by Dr. Lorelee Market, MD  for consultation & management of decompensated cirrhosis. Patient was recently diagnosed with decompensated cirrhosis, when she presented to [facility] on 02/09/2018 with large volume hematemesis secondary to variceal hemorrhage, underwent upper endoscopy on 02/10/2018 by Dr. Vicente Males, found to have large esophageal varices with red wale signs and underwent variceal ligation x 10. Her bleeding resolved by the time of discharge. Patient was found to have hepatitis C antibodies and HCV virus was not detected. She had other secondary liver disease workup which revealed weakly positive anti-smooth muscle antibodies. She has long-standing history of diabetes, on metformin. She has severe iron deficiency and B12 deficiency, discharged on oral iron and oral B12 supplementation. She was supposed to start nadolol but she did not get prescription filled due to high cost. Patient was just discharged from the hospital 2 days ago  Interval summary: since discharge, patient has been doing fairly well. She denies hematemesis, melena, rectal bleeding. She reports worsening of abdominal distention and swelling of legs which started prior to hospitalization. She is not on diuretics. She is taking iron and B12 supplements, omeprazole.she reports her stools are dark but formed. She wants to know if she can restart drinking Coke. She is not on a low-sodium diet. She denies history of heavy smoking or heavy alcohol use in the past. She denies easy bruising, altered  sleep cycle   Follow-up visit 02/22/2018 Patient is seen by Dr. Mike Gip on it [place] for lung nodule as well as retroperitoneal lymphadenopathy. Workup is in progress. Patient is here for follow-up with regards to her decompensated cirrhosis. She is adhering to medications that I recommended during last visit. She is taking Lasix 20 mg, spironolactone 50 mg, propranolol 20 mg twice a day.She is adhering to a low-salt diet, denies swelling of legs. She has developed cellulitis in her left lower leg and is being started on antibiotic. She says her abdominal distention is improving. She denies melena, hematochezia, hematemesis. She is taking oral iron 3 pills daily and vitamin B12 daily by mouth. She reports that she has been constipated and taking Colace. She otherwise denies any complaints today. She said she had given blood samples for the labs that I ordered last week but I do not see any results. We're calling the lab to find out the status  Follow-up visit 03/15/2018 Patient is seen by Dr. Mike Gip yesterday, undergoing evaluation for lung lesion, plan is to repeat CT scan in 62month There is no concern for retroperitoneal lymphadenopathy. She is scheduled for pelvic ultrasound. She has lost about 27 pounds in last 3 weeks, mostly from aggressive diuresis as she was severely volume overloaded when he saw her in clinic on 02/22/2018. At that time, I increased her diuretics dose. She reports feeling fatigued. Reports that her stools are blackish brown as she is taking oral iron 3 pills daily along with MiraLAX. She denies hematemesis, rectal bleeding. She is adherent to low-salt diet  She is single, lives with a roommate  NSAIDs: none including BC  powder or Goody powder  Antiplts/Anticoagulants/Anti thrombotics: none  GI Procedures:  Did not have a colonoscopy in the past EGD 02/09/18 The examined duodenum was normal. The stomach was normal. Five columns of non-bleeding grade III varices  were found in the lower third of the esophagus,. Stigmata of recent bleeding were evident and red wale signs were present. Ten bands were successfully placed with incomplete eradication of varices. There was no bleeding during, and at the end, of the procedure.  Past Medical History:  Diagnosis Date  . Anemia    TAKES IRON TAB  . Arthritis    SHOULDER  . Bronchitis    HX OF  . Diabetes mellitus without complication ([place])    TYPE 2  . Full dentures    UPPER AND LOWER    Past Surgical History:  Procedure Laterality Date  . CATARACT EXTRACTION W/PHACO Right 02/06/2018   Procedure: CATARACT EXTRACTION PHACO AND INTRAOCULAR LENS PLACEMENT ([place]) RIGHT DIABETIC;  Surgeon: Leandrew Koyanagi, MD;  Location: [facility];  Service: Ophthalmology;  Laterality: Right;  DIABETIC, ORAL MED  . DENTAL SURGERY     EXTRACTIONS  . ESOPHAGOGASTRODUODENOSCOPY (EGD) WITH PROPOFOL N/A 02/10/2018   Procedure: ESOPHAGOGASTRODUODENOSCOPY (EGD) WITH PROPOFOL;  Surgeon: Jonathon Bellows, MD;  Location: [facility] ENDOSCOPY;  Service: Gastroenterology;  Laterality: N/A;     Current Outpatient Medications:  .  ergocalciferol (DRISDOL) 50000 units capsule, Take 1 capsule (50,000 Units total) by mouth once a week., Disp: 4 capsule, Rfl: 2 .  ferrous sulfate 325 (65 FE) MG tablet, Take 1 tablet (325 mg total) by mouth daily., Disp: 30 tablet, Rfl: 3 .  furosemide (LASIX) 20 MG tablet, Take 1 tablet (20 mg total) by mouth daily., Disp: 90 tablet, Rfl: 0 .  metFORMIN (GLUCOPHAGE) 1000 MG tablet, Take 1,000 mg by mouth 2 (two) times daily with a meal. AM AND PM, Disp: , Rfl:  .  omeprazole (PRILOSEC) 40 MG capsule, Take 1 capsule (40 mg total) by mouth daily., Disp: 30 capsule, Rfl: 0 .  polyethylene glycol powder (GLYCOLAX/MIRALAX) powder, Take 17 g by mouth 2 (two) times daily., Disp: 255 g, Rfl: 2 .  potassium chloride SA (K-DUR,KLOR-CON) 20 MEQ tablet, Take 2 tablets (40 mEq total) by mouth daily., Disp: 60 tablet,  Rfl: 0 .  propranolol (INDERAL) 20 MG tablet, Take 1 tablet (20 mg total) by mouth 2 (two) times daily., Disp: 180 tablet, Rfl: 1 .  spironolactone (ALDACTONE) 50 MG tablet, Take 1 tablet (50 mg total) by mouth daily., Disp: 90 tablet, Rfl: 0 .  vitamin B-12 1000 MCG tablet, Take 1 tablet (1,000 mcg total) by mouth daily., Disp: 120 tablet, Rfl: 0   Family History  Problem Relation Age of Onset  . CAD Father   . Cancer Maternal Uncle      Social History   Tobacco Use  . Smoking status: Never Smoker  . Smokeless tobacco: Never Used  Substance Use Topics  . Alcohol use: No    Frequency: Never  . Drug use: Not on file    Allergies as of 03/15/2018  . (No Known Allergies)    Review of Systems:    All systems reviewed and negative except where noted in HPI.   Physical Exam:  BP 116/67   Ht 5' 3" (1.6 m)   Wt 162 lb 3.2 oz (73.6 kg)   BMI 28.73 kg/m  No LMP recorded. Patient is postmenopausal.  General:   Alert,  Well-developed, well-nourished, pleasant and cooperative in NAD  Head:  Normocephalic and atraumatic. Eyes:  Sclera clear, no icterus.   Conjunctiva pink. Ears:  Normal auditory acuity. Nose:  No deformity, discharge, or lesions. Mouth:  No deformity or lesions,oropharynx pink & moist. Neck:  Supple; no masses or thyromegaly. Lungs:  Respirations even and unlabored.  Clear throughout to auscultation.   No wheezes, crackles, or rhonchi. No acute distress. Heart:  Regular rate and rhythm; no murmurs, clicks, rubs, or gallops. Abdomen:  Normal bowel sounds. Soft, non-tender and markedly less distended compared to last visit, without masses, hepatosplenomegaly or hernias noted.  No guarding or rebound tenderness.   Rectal: Not performed Msk:  Symmetrical without gross deformities. muscle wasting in bilateral upper extremities Good, equal movement & strength bilaterally. Pulses:  Normal pulses noted. Extremities:  No clubbing, no edema, No cyanosis. Neurologic:  Alert  and oriented x3;  grossly normal neurologically. Skin: telangiectasias on upper anterior chest, No jaundice. Lymph Nodes:  No significant cervical adenopathy. Psych:  Alert and cooperative. Normal mood and affect.  Imaging Studies: Reviewed. CT A/P with contrast on 02/09/2018 revealed mildly enlarged retroperitoneal lymph nodes, largest with 11 mm concerning for metastatic disease or malignancy,18 mm spiculated density in the right lower lobe concerning for malignancy associated with small left pleural effusion  Assessment and Plan:   Crystal [name] is a 67 y.o. Caucasian female with obesity, diabetes on metformin with decompensated cirrhosis secondary to ascites and variceal bleed from esophageal varices status post variceal ligation on 02/10/2018 here for follow-up  Decompensated cirrhosis: child class B, MELD-Na 9 Secondary liver disease workup positive for HCV antibodies, but viral load not detected, anti-smooth muscle antibodies weakly positive. 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 Ceruloplasmin, antimitochondrial antibodies, hepatitis B, alpha-1 antitrypsin came back negative Portal hypertension: manifested as ascites, esophageal varices, splenomegaly Ascites/volume overload: Significantly improved on Lasix 40 mg and spironolactone 100 mg daily. Since, she lost significant amount of weight, I recommend to decrease Lasix to 20 mg and spironolactone to 50 mg daily. Recheck BMP and magnesium, continue low-sodium diet. Avoid processed meats, and foods, chips, carbonated beverages. Provided her with educational material about low-sodium diet during initial visit Varices: variceal bleed from esophageal varices, status post EGD on 02/10/2018, [id number] Switched from Nadolol to propranolol 20 mg twice a day due to her insurance and adjust the dose based on heart rate. Her heart rate is currently at goal, continue current dose Repeat EGD for  variceal surveillance and banding if necessary, scheduled She will be a good candidate for TIPS if she develops recurrent variceal bleed Continue omeprazole daily Anemia: she has developed severe iron deficiency anemia secondary to variceal hemorrhage. Hemoglobin stable at the time of discharge. Follow up on labs, continue oral iron 3 pills daily. Also, has B12 deficiency, continue oral B12 1019mg daily, she is also receiving intramuscular B-12. Recheck B12, iron studies today. There is no evidence of coagulopathy or thrombocytopenia HCC screening: AFP normal, no liver lesions on CT in 01/2018. Recheck AFP and ultrasound liver in 6 months PSE: none HRS: none Hepatic hydrothorax: none  Health maintenance:  She is immune to hepatitis B, check hepatitis A IgG Check vitamin D levels Avoid excess Tylenol and NSAIDs use Colon cancer screening: recommend colonoscopy along with EGD, scheduled Right lower lobe lung lesion and retroperitoneal lymphadenopathy: follow-up with Dr. CMike Gip no evidence of retroperitoneal lymphadenopathy. Repeat CT chest in 1 month to evaluate for lung lesion  Follow up in 4 weeks  [name], MD

## 2018-03-15 NOTE — Addendum Note (Signed)
Addended by: Vanetta Mulders on: 03/15/2018 12:13 PM   Modules accepted: Orders

## 2018-03-15 NOTE — Patient Instructions (Signed)
1. Decrease lasix/furosemide to 14m daily and spironolactone to 552mdaily 2. Labs today   Please call our office to speak with my nurse MiDriscilla [phone] business hours from 8am to 4pm if you have any questions/concerns. During after hours, you will be redirected to on call GI physician. For any emergency please call 911 or go the nearest emergency room.    RoCephas DarbyMD [address] StreetSuHitchcockBuWilton[place] [id number]: [phone]: [phone]

## 2018-03-16 ENCOUNTER — Encounter: Payer: Self-pay | Admitting: Hematology and Oncology

## 2018-03-16 DIAGNOSIS — N949 Unspecified condition associated with female genital organs and menstrual cycle: Secondary | ICD-10-CM | POA: Insufficient documentation

## 2018-03-16 DIAGNOSIS — Z7189 Other specified counseling: Secondary | ICD-10-CM | POA: Insufficient documentation

## 2018-03-16 LAB — VITAMIN D 25 HYDROXY (VIT D DEFICIENCY, FRACTURES): Vit D, 25-Hydroxy: 29.2 ng/mL — ABNORMAL LOW (ref 30.0–100.0)

## 2018-03-18 ENCOUNTER — Other Ambulatory Visit
Admission: RE | Admit: 2018-03-18 | Discharge: 2018-03-18 | Disposition: A | Discharge status: Home / Self Care | Payer: Medicare HMO | Source: Ambulatory Visit | Attending: Gastroenterology | Admitting: Gastroenterology

## 2018-03-18 ENCOUNTER — Telehealth: Payer: Self-pay | Admitting: Gastroenterology

## 2018-03-18 ENCOUNTER — Ambulatory Visit
Admission: RE | Admit: 2018-03-18 | Discharge: 2018-03-18 | Disposition: A | Discharge status: Home / Self Care | Payer: Medicare HMO | Source: Ambulatory Visit | Attending: Urgent Care | Admitting: Urgent Care

## 2018-03-18 DIAGNOSIS — K7460 Unspecified cirrhosis of liver: Secondary | ICD-10-CM | POA: Insufficient documentation

## 2018-03-18 DIAGNOSIS — N949 Unspecified condition associated with female genital organs and menstrual cycle: Secondary | ICD-10-CM

## 2018-03-18 DIAGNOSIS — N83202 Unspecified ovarian cyst, left side: Secondary | ICD-10-CM | POA: Insufficient documentation

## 2018-03-18 DIAGNOSIS — K7581 Nonalcoholic steatohepatitis (NASH): Secondary | ICD-10-CM | POA: Diagnosis present

## 2018-03-18 NOTE — Telephone Encounter (Signed)
LVM for patient to call to answer her questions regarding her scheduled procedure.  Thanks [employer]

## 2018-03-18 NOTE — Telephone Encounter (Signed)
Pt left vm she states she has procedure set up she said the paper said to stop taking iron pill what other pills is she supposed to stop and  She wants to know when to start her liquid diet please call pt

## 2018-03-19 ENCOUNTER — Encounter: Payer: Self-pay | Admitting: Gastroenterology

## 2018-03-19 ENCOUNTER — Telehealth: Payer: Self-pay | Admitting: Gastroenterology

## 2018-03-19 ENCOUNTER — Ambulatory Visit
Admission: EM | Admit: 2018-03-19 | Discharge: 2018-03-19 | Disposition: A | Discharge status: Home / Self Care | Payer: Medicare HMO | Attending: Family Medicine | Admitting: Family Medicine

## 2018-03-19 ENCOUNTER — Other Ambulatory Visit: Payer: Self-pay

## 2018-03-19 ENCOUNTER — Other Ambulatory Visit: Payer: Self-pay | Admitting: Gastroenterology

## 2018-03-19 DIAGNOSIS — H6501 Acute serous otitis media, right ear: Secondary | ICD-10-CM

## 2018-03-19 LAB — MISC LABCORP TEST (SEND OUT)
Labcorp test code: 1321
Labcorp test code: 1503
Labcorp test code: 4598

## 2018-03-19 MED ORDER — AMOXICILLIN-POT CLAVULANATE 875-125 MG PO TABS: 1.0000 | ORAL_TABLET | Freq: Two times a day (BID) | ORAL | 0 refills | Status: DC

## 2018-03-19 NOTE — Telephone Encounter (Signed)
ERROR

## 2018-03-19 NOTE — Discharge Instructions (Signed)
Over the counter antihistamine

## 2018-03-19 NOTE — ED Provider Notes (Signed)
[facility]    CSN: [id number] Arrival date & time: 03/19/18  1333     History   Chief Complaint Chief Complaint  Patient presents with  . Otalgia    HPI Crystal [name] is a 67 y.o. female.   The history is provided by the patient.  Otalgia  Location:  Right Behind ear:  No abnormality Quality:  Aching Severity:  Mild Onset quality:  Sudden Duration:  5 days Timing:  Constant Progression:  Worsening Chronicity:  New Context: recent URI   Relieved by:  Nothing Ineffective treatments:  OTC medications Associated symptoms: congestion and rhinorrhea   Associated symptoms: no abdominal pain, no cough, no diarrhea, no ear discharge, no fever, no headaches, no hearing loss, no neck pain, no rash, no sore throat, no tinnitus and no vomiting     Past Medical History:  Diagnosis Date  . Anemia    TAKES IRON TAB  . Arthritis    SHOULDER  . Bronchitis    HX OF  . Diabetes mellitus without complication ([place])    TYPE 2  . Full dentures    UPPER AND LOWER    Patient Active Problem List   Diagnosis Date Noted  . Adnexal cyst 03/16/2018  . Goals of care, counseling/discussion 03/16/2018  . Right middle lobe pulmonary nodule 03/14/2018  . Pernicious anemia 02/22/2018  . Right lower lobe pulmonary nodule 02/18/2018  . Iron deficiency anemia due to chronic blood loss 02/18/2018  . B12 deficiency 02/18/2018  . 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 Hepatitis C virus infection without hepatic coma 02/18/2018  . Upper GI bleed 02/09/2018    Past Surgical History:  Procedure Laterality Date  . CATARACT EXTRACTION W/PHACO Right 02/06/2018   Procedure: CATARACT EXTRACTION PHACO AND INTRAOCULAR LENS PLACEMENT ([place]) RIGHT DIABETIC;  Surgeon: Leandrew Koyanagi, MD;  Location: [place];  Service: Ophthalmology;  Laterality: Right;  DIABETIC, ORAL MED  . DENTAL SURGERY     EXTRACTIONS  . ESOPHAGOGASTRODUODENOSCOPY (EGD) WITH PROPOFOL N/A  02/10/2018   Procedure: ESOPHAGOGASTRODUODENOSCOPY (EGD) WITH PROPOFOL;  Surgeon: Jonathon Bellows, MD;  Location: [facility] ENDOSCOPY;  Service: Gastroenterology;  Laterality: N/A;    OB History   None      Home Medications    Prior to Admission medications   Medication Sig Start Date End Date Taking? Authorizing Provider  amoxicillin-clavulanate (AUGMENTIN) 875-125 MG tablet Take 1 tablet by mouth 2 (two) times daily. 03/19/18   Norval Gable, MD  ferrous sulfate 325 (65 FE) MG tablet Take 1 tablet (325 mg total) by mouth daily. 02/13/18   Bettey Costa, MD  furosemide (LASIX) 20 MG tablet Take 1 tablet (20 mg total) by mouth daily. 02/14/18 05/15/18  Lin Landsman, MD  metFORMIN (GLUCOPHAGE) 1000 MG tablet Take 1,000 mg by mouth 2 (two) times daily with a meal. AM AND PM    [provider]  omeprazole (PRILOSEC) 40 MG capsule Take 1 capsule (40 mg total) by mouth daily. 02/12/18 02/12/19  Bettey Costa, MD  polyethylene glycol powder (GLYCOLAX/MIRALAX) powder Take 17 g by mouth 2 (two) times daily. 02/14/18   Lin Landsman, MD  potassium chloride SA (K-DUR,KLOR-CON) 20 MEQ tablet Take 2 tablets (40 mEq total) by mouth daily. 02/25/18 03/27/18  Lin Landsman, MD  propranolol (INDERAL) 20 MG tablet Take 1 tablet (20 mg total) by mouth 2 (two) times daily. 02/14/18 08/13/18  Lin Landsman, MD  spironolactone (ALDACTONE) 50 MG tablet Take 1 tablet (50 mg total)  by mouth daily. 02/14/18 05/15/18  Lin Landsman, MD  vitamin B-12 1000 MCG tablet Take 1 tablet (1,000 mcg total) by mouth daily. 02/13/18   Bettey Costa, MD  Vitamin D, Ergocalciferol, (DRISDOL) 50000 units CAPS capsule TAKE ONE CAPSULE BY MOUTH ONE TIME PER WEEK 03/19/18   Lin Landsman, MD    Family History Family History  Problem Relation Age of Onset  . CAD Father   . Cancer Maternal Uncle     Social History Social History   Tobacco Use  . Smoking status: Never Smoker  . Smokeless tobacco: Never Used  Substance  Use Topics  . Alcohol use: No    Frequency: Never  . Drug use: Never     Allergies   Patient has no known allergies.   Review of Systems Review of Systems  Constitutional: Negative for fever.  HENT: Positive for congestion, ear pain and rhinorrhea. Negative for ear discharge, hearing loss, sore throat and tinnitus.   Respiratory: Negative for cough.   Gastrointestinal: Negative for abdominal pain, diarrhea and vomiting.  Musculoskeletal: Negative for neck pain.  Skin: Negative for rash.  Neurological: Negative for headaches.     Physical Exam Triage Vital Signs ED Triage Vitals  Enc Vitals Group     BP 03/19/18 1344 129/61     Pulse Rate 03/19/18 1344 67     Resp 03/19/18 1344 18     Temp 03/19/18 1344 98.3 F (36.8 C)     Temp Source 03/19/18 1344 Oral     SpO2 03/19/18 1348 100 %     Weight 03/19/18 1345 167 lb (75.8 kg)     Height 03/19/18 1345 5' 3" (1.6 m)     Head Circumference --      Peak Flow --      Pain Score 03/19/18 1345 5     Pain Loc --      Pain Edu? --      Excl. in [place]? --    No data found.  Updated Vital Signs BP 129/61 (BP Location: Right Arm)   Pulse 67   Temp 98.3 F (36.8 C) (Oral)   Resp 18   Ht 5' 3" (1.6 m)   Wt 167 lb (75.8 kg)   SpO2 100%   BMI 29.58 kg/m   Visual Acuity Right Eye Distance:   Left Eye Distance:   Bilateral Distance:    Right Eye Near:   Left Eye Near:    Bilateral Near:     Physical Exam  Constitutional: She appears well-developed and well-nourished. No distress.  HENT:  Head: Normocephalic and atraumatic.  Right Ear: External ear and ear canal normal. Tympanic membrane is erythematous and bulging. A middle ear effusion is present.  Left Ear: Tympanic membrane, external ear and ear canal normal.  Nose: Mucosal edema and rhinorrhea present. No nose lacerations, sinus tenderness, nasal deformity, septal deviation or nasal septal hematoma. No epistaxis.  No foreign bodies. Right sinus exhibits no  maxillary sinus tenderness and no frontal sinus tenderness. Left sinus exhibits no maxillary sinus tenderness and no frontal sinus tenderness.  Mouth/Throat: Uvula is midline, oropharynx is clear and moist and mucous membranes are normal. No oropharyngeal exudate.  Eyes: Pupils are equal, round, and reactive to light. Conjunctivae and EOM are normal. Right eye exhibits no discharge. Left eye exhibits no discharge. No scleral icterus.  Neck: Normal range of motion. Neck supple. No thyromegaly present.  Cardiovascular: Normal rate, regular rhythm and normal heart sounds.  Pulmonary/Chest: Effort normal and breath sounds normal. No respiratory distress. She has no wheezes. She has no rales.  Lymphadenopathy:    She has no cervical adenopathy.  Skin: She is not diaphoretic.  Nursing note and vitals reviewed.    UC Treatments / Results  Labs (all labs ordered are listed, but only abnormal results are displayed) Labs Reviewed - No data to display  EKG None  Radiology US Pelvic Complete With Transvaginal  Result Date: 03/18/2018 CLINICAL DATA:  Evaluate adnexal cyst EXAM: TRANSABDOMINAL AND TRANSVAGINAL ULTRASOUND OF PELVIS TECHNIQUE: Both transabdominal and transvaginal ultrasound examinations of the pelvis were performed. Transabdominal technique was performed for global imaging of the pelvis including uterus, ovaries, adnexal regions, and pelvic cul-de-sac. It was necessary to proceed with endovaginal exam following the transabdominal exam to visualize the ovaries and endometrium. COMPARISON:  None FINDINGS: Uterus Measurements: 5.5 x 2.6 x 4.6 cm. No fibroids or other mass visualized. Endometrium Thickness: 2.9 mm.  No focal abnormality visualized. Right ovary Not visualized. Left ovary Measurements: 6.2 x 4.6 x 6.6 cm. Contains a 6.1 x 4.5 x 6.6 cm simple cyst. Other findings No abnormal free fluid. IMPRESSION: 1. The simple cyst in the left ovary measures 6.1 x 4.5 x 6.6 cm. Recommend an  ultrasound in 1 year per consensus guidelines to ensure stability. 2. The right ovary was not visualized. Electronically Signed   By: Dorise Bullion III M.D   On: 03/18/2018 16:46    Procedures Procedures (including critical care time)  Medications Ordered in UC Medications - No data to display  Initial Impression / Assessment and Plan / UC Course  I have reviewed the triage vital signs and the nursing notes.  Pertinent labs & imaging results that were available during my care of the patient were reviewed by me and considered in my medical decision making (see chart for details).      Final Clinical Impressions(s) / UC Diagnoses   Final diagnoses:  Right acute serous otitis media, recurrence not specified    ED Prescriptions    Medication Sig Dispense Auth. Provider   amoxicillin-clavulanate (AUGMENTIN) 875-125 MG tablet Take 1 tablet by mouth 2 (two) times daily. 20 tablet Norval Gable, MD     1. diagnosis reviewed with patient 2. rx as per orders above; reviewed possible side effects, interactions, risks and benefits  3. Recommend supportive treatment with otc analgesics prn 4. Follow-up prn if symptoms worsen or don't improve   Controlled Substance Prescriptions [place] Controlled Substance Registry consulted? Not Applicable   Norval Gable, MD 03/19/18 [phone]

## 2018-03-19 NOTE — ED Triage Notes (Signed)
Pt with bilateral otalgia and feels "like 'I'm under water." States she just finished Amoxicillin for swelling around her ear

## 2018-03-19 NOTE — Telephone Encounter (Signed)
Patient called back.

## 2018-03-20 ENCOUNTER — Inpatient Hospital Stay: Payer: Medicare HMO

## 2018-03-20 DIAGNOSIS — D510 Vitamin B12 deficiency anemia due to intrinsic factor deficiency: Secondary | ICD-10-CM | POA: Diagnosis not present

## 2018-03-20 DIAGNOSIS — Z7189 Other specified counseling: Secondary | ICD-10-CM | POA: Diagnosis not present

## 2018-03-20 DIAGNOSIS — E538 Deficiency of other specified B group vitamins: Secondary | ICD-10-CM

## 2018-03-20 DIAGNOSIS — N949 Unspecified condition associated with female genital organs and menstrual cycle: Secondary | ICD-10-CM | POA: Diagnosis not present

## 2018-03-20 DIAGNOSIS — R911 Solitary pulmonary nodule: Secondary | ICD-10-CM | POA: Diagnosis not present

## 2018-03-20 DIAGNOSIS — D500 Iron deficiency anemia secondary to blood loss (chronic): Secondary | ICD-10-CM | POA: Diagnosis not present

## 2018-03-20 MED ORDER — CYANOCOBALAMIN 1000 MCG/ML IJ SOLN
1000.0000 ug | Freq: Once | INTRAMUSCULAR | Status: AC
  Administered 2018-03-20: 1000 ug via INTRAMUSCULAR

## 2018-03-20 NOTE — Telephone Encounter (Signed)
Pt  Left vm t o speak to [place] she states she left messages before to ask if she needed to stop[ irion pill she states she just stopped taking it because she did not hear anything if this was wrong please call pt

## 2018-03-21 ENCOUNTER — Other Ambulatory Visit: Payer: Self-pay | Admitting: Gastroenterology

## 2018-03-22 ENCOUNTER — Other Ambulatory Visit: Payer: Self-pay

## 2018-03-22 DIAGNOSIS — K7460 Unspecified cirrhosis of liver: Secondary | ICD-10-CM

## 2018-03-22 DIAGNOSIS — K7581 Nonalcoholic steatohepatitis (NASH): Principal | ICD-10-CM

## 2018-03-22 NOTE — Telephone Encounter (Signed)
Spoke with pt and she states she has taken Copy[employer], advertising for her indigestion and she feels better, also said she has already done labs last week but If she has to take labs again se will and then she will see you at next office visit

## 2018-03-22 NOTE — Telephone Encounter (Signed)
Patient stopped by the office and discussed.  She will stop iron supplement 5 days before.  Thanks [employer]

## 2018-03-25 ENCOUNTER — Ambulatory Visit
Admission: RE | Admit: 2018-03-25 | Discharge: 2018-03-25 | Disposition: A | Discharge status: Home / Self Care | Payer: Medicare HMO | Source: Ambulatory Visit | Attending: Gastroenterology | Admitting: Gastroenterology

## 2018-03-25 ENCOUNTER — Ambulatory Visit: Payer: Medicare HMO | Admitting: Anesthesiology

## 2018-03-25 ENCOUNTER — Encounter
Admission: RE | Disposition: A | Discharge status: Home / Self Care | Payer: Self-pay | Source: Ambulatory Visit | Attending: Gastroenterology

## 2018-03-25 ENCOUNTER — Encounter: Payer: Self-pay | Admitting: *Deleted

## 2018-03-25 DIAGNOSIS — E119 Type 2 diabetes mellitus without complications: Secondary | ICD-10-CM | POA: Insufficient documentation

## 2018-03-25 DIAGNOSIS — I8500 Esophageal varices without bleeding: Secondary | ICD-10-CM | POA: Diagnosis not present

## 2018-03-25 DIAGNOSIS — D122 Benign neoplasm of ascending colon: Secondary | ICD-10-CM | POA: Diagnosis not present

## 2018-03-25 DIAGNOSIS — I868 Varicose veins of other specified sites: Secondary | ICD-10-CM | POA: Diagnosis not present

## 2018-03-25 DIAGNOSIS — M19019 Primary osteoarthritis, unspecified shoulder: Secondary | ICD-10-CM | POA: Diagnosis not present

## 2018-03-25 DIAGNOSIS — K279 Peptic ulcer, site unspecified, unspecified as acute or chronic, without hemorrhage or perforation: Secondary | ICD-10-CM

## 2018-03-25 DIAGNOSIS — Z9841 Cataract extraction status, right eye: Secondary | ICD-10-CM | POA: Diagnosis not present

## 2018-03-25 DIAGNOSIS — Z792 Long term (current) use of antibiotics: Secondary | ICD-10-CM | POA: Insufficient documentation

## 2018-03-25 DIAGNOSIS — Z1211 Encounter for screening for malignant neoplasm of colon: Secondary | ICD-10-CM | POA: Insufficient documentation

## 2018-03-25 DIAGNOSIS — K7460 Unspecified cirrhosis of liver: Secondary | ICD-10-CM | POA: Diagnosis not present

## 2018-03-25 DIAGNOSIS — D500 Iron deficiency anemia secondary to blood loss (chronic): Secondary | ICD-10-CM

## 2018-03-25 DIAGNOSIS — Z8249 Family history of ischemic heart disease and other diseases of the circulatory system: Secondary | ICD-10-CM | POA: Diagnosis not present

## 2018-03-25 DIAGNOSIS — Z79899 Other long term (current) drug therapy: Secondary | ICD-10-CM | POA: Insufficient documentation

## 2018-03-25 DIAGNOSIS — K5520 Angiodysplasia of colon without hemorrhage: Secondary | ICD-10-CM

## 2018-03-25 DIAGNOSIS — I8511 Secondary esophageal varices with bleeding: Secondary | ICD-10-CM

## 2018-03-25 DIAGNOSIS — R188 Other ascites: Secondary | ICD-10-CM | POA: Diagnosis not present

## 2018-03-25 DIAGNOSIS — Z809 Family history of malignant neoplasm, unspecified: Secondary | ICD-10-CM | POA: Insufficient documentation

## 2018-03-25 DIAGNOSIS — K649 Unspecified hemorrhoids: Secondary | ICD-10-CM | POA: Diagnosis not present

## 2018-03-25 DIAGNOSIS — M1990 Unspecified osteoarthritis, unspecified site: Secondary | ICD-10-CM | POA: Diagnosis not present

## 2018-03-25 DIAGNOSIS — K644 Residual hemorrhoidal skin tags: Secondary | ICD-10-CM | POA: Insufficient documentation

## 2018-03-25 DIAGNOSIS — Z8719 Personal history of other diseases of the digestive system: Secondary | ICD-10-CM

## 2018-03-25 DIAGNOSIS — I8510 Secondary esophageal varices without bleeding: Secondary | ICD-10-CM | POA: Diagnosis not present

## 2018-03-25 DIAGNOSIS — Z7984 Long term (current) use of oral hypoglycemic drugs: Secondary | ICD-10-CM | POA: Diagnosis not present

## 2018-03-25 DIAGNOSIS — K635 Polyp of colon: Secondary | ICD-10-CM | POA: Diagnosis not present

## 2018-03-25 DIAGNOSIS — K558 Other vascular disorders of intestine: Secondary | ICD-10-CM

## 2018-03-25 DIAGNOSIS — D649 Anemia, unspecified: Secondary | ICD-10-CM | POA: Insufficient documentation

## 2018-03-25 DIAGNOSIS — K31819 Angiodysplasia of stomach and duodenum without bleeding: Secondary | ICD-10-CM | POA: Insufficient documentation

## 2018-03-25 DIAGNOSIS — K7469 Other cirrhosis of liver: Secondary | ICD-10-CM

## 2018-03-25 DIAGNOSIS — D126 Benign neoplasm of colon, unspecified: Secondary | ICD-10-CM | POA: Diagnosis not present

## 2018-03-25 DIAGNOSIS — K259 Gastric ulcer, unspecified as acute or chronic, without hemorrhage or perforation: Secondary | ICD-10-CM | POA: Insufficient documentation

## 2018-03-25 DIAGNOSIS — K648 Other hemorrhoids: Secondary | ICD-10-CM | POA: Diagnosis not present

## 2018-03-25 HISTORY — DX: Unspecified cirrhosis of liver: K74.60

## 2018-03-25 HISTORY — PX: HEMORRHOID BANDING: SHX5850

## 2018-03-25 HISTORY — PX: ESOPHAGOGASTRODUODENOSCOPY (EGD) WITH PROPOFOL: SHX5813

## 2018-03-25 HISTORY — PX: COLONOSCOPY WITH PROPOFOL: SHX5780

## 2018-03-25 LAB — GLUCOSE, CAPILLARY
Glucose-Capillary: 47 mg/dL — ABNORMAL LOW (ref 65–99)
Glucose-Capillary: 88 mg/dL (ref 65–99)

## 2018-03-25 SURGERY — ESOPHAGOGASTRODUODENOSCOPY (EGD) WITH PROPOFOL
Anesthesia: General

## 2018-03-25 MED ORDER — PROPOFOL 10 MG/ML IV BOLUS
INTRAVENOUS | Status: AC
  Filled 2018-03-25: qty 20

## 2018-03-25 MED ORDER — PROPOFOL 10 MG/ML IV BOLUS
INTRAVENOUS | Status: DC | PRN
  Administered 2018-03-25: 20 mg via INTRAVENOUS
  Administered 2018-03-25: 50 mg via INTRAVENOUS

## 2018-03-25 MED ORDER — FUROSEMIDE 20 MG PO TABS: 20.0000 mg | ORAL_TABLET | Freq: Every day | ORAL | 0 refills | Status: DC

## 2018-03-25 MED ORDER — LIDOCAINE HCL (CARDIAC) PF 100 MG/5ML IV SOSY
PREFILLED_SYRINGE | INTRAVENOUS | Status: DC | PRN
  Administered 2018-03-25: 50 mg via INTRAVENOUS

## 2018-03-25 MED ORDER — OMEPRAZOLE 40 MG PO CPDR: 40.0000 mg | DELAYED_RELEASE_CAPSULE | Freq: Two times a day (BID) | ORAL | 0 refills | Status: DC

## 2018-03-25 MED ORDER — PROPOFOL 500 MG/50ML IV EMUL
INTRAVENOUS | Status: DC | PRN
  Administered 2018-03-25: 170 ug/kg/min via INTRAVENOUS

## 2018-03-25 MED ORDER — SODIUM CHLORIDE 0.9 % IV SOLN
INTRAVENOUS | Status: DC
  Administered 2018-03-25: 1000 mL via INTRAVENOUS

## 2018-03-25 MED ORDER — TRAMADOL HCL 50 MG PO TABS
50.0000 mg | ORAL_TABLET | Freq: Two times a day (BID) | ORAL | Status: AC | PRN
  Administered 2018-03-25: 50 mg via ORAL
  Filled 2018-03-25: qty 1

## 2018-03-25 NOTE — Op Note (Signed)
[facility] Patient Name: Sophira Tripodi Procedure Date: 03/25/2018 9:10 AM MRN: [id number] Account #: [id number] Date of Birth: 11-Apr-1951 Admit Type: Outpatient Age: 67 Room: [facility] ENDO ROOM 2 Gender: Female Note Status: Finalized Procedure:            Colonoscopy Indications:          Screening for colorectal malignant neoplasm, This is                        the patient's first colonoscopy Providers:            Lin Landsman MD, MD Medicines:            Monitored Anesthesia Care Complications:        No immediate complications. Estimated blood loss: None. Procedure:            Pre-Anesthesia Assessment:                       - Prior to the procedure, a History and Physical was                        performed, and patient medications and allergies were                        reviewed. The patient is competent. The risks and                        benefits of the procedure and the sedation options and                        risks were discussed with the patient. All questions                        were answered and informed consent was obtained.                        Patient identification and proposed procedure were                        verified by the physician, the nurse, the                        anesthesiologist, the anesthetist and the technician in                        the pre-procedure area in the procedure room in the                        endoscopy suite. Mental Status Examination: alert and                        oriented. Airway Examination: normal oropharyngeal                        airway and neck mobility. Respiratory Examination:                        clear to auscultation. CV Examination: normal.  Prophylactic Antibiotics: The patient does not require                        prophylactic antibiotics. Prior Anticoagulants: The                        patient has taken no previous anticoagulant or                         antiplatelet agents. ASA Grade Assessment: III - A                        patient with severe systemic disease. After reviewing                        the risks and benefits, the patient was deemed in                        satisfactory condition to undergo the procedure. The                        anesthesia plan was to use monitored anesthesia care                        (MAC). Immediately prior to administration of                        medications, the patient was re-assessed for adequacy                        to receive sedatives. The heart rate, respiratory rate,                        oxygen saturations, blood pressure, adequacy of                        pulmonary ventilation, and response to care were                        monitored throughout the procedure. The physical status                        of the patient was re-assessed after the procedure.                       After obtaining informed consent, the colonoscope was                        passed under direct vision. Throughout the procedure,                        the patient's blood pressure, pulse, and oxygen                        saturations were monitored continuously. The [place] (S#: [id number]) was introduced through  the anus and advanced to the the cecum, identified by                        appendiceal orifice and ileocecal valve. The                        colonoscopy was performed without difficulty. The                        patient tolerated the procedure well. The quality of                        the bowel preparation was evaluated using the BBPS                        [facility] Bowel Preparation Scale) with scores of: Right                        Colon = 3, Transverse Colon = 3 and Left Colon = 3                        (entire mucosa seen well with no residual staining,                        small fragments of stool or  opaque liquid). The total                        BBPS score equals 9. Findings:      The perianal and digital rectal examinations were normal. Pertinent       negatives include normal sphincter tone and no palpable rectal lesions.      A 5 mm polyp was found in the ascending colon. The polyp was sessile.       The polyp was removed with a cold snare. Resection and retrieval were       complete.      Non-bleeding rectal varices were found.      External hemorrhoids were found during retroflexion. The hemorrhoids       were medium-sized.      The exam was otherwise without abnormality. Impression:           - One 5 mm polyp in the ascending colon, removed with a                        cold snare. Resected and retrieved.                       - Rectal varices.                       - External hemorrhoids.                       - The examination was otherwise normal. Recommendation:       - Discharge patient to home.                       - Low sodium diet today.                       - Continue present medications.                       -  Await pathology results.                       - Repeat colonoscopy in 5 years for surveillance based                        on pathology results. Procedure Code(s):    --- Professional ---                       [phone], Colonoscopy, flexible; with removal of tumor(s),                        polyp(s), or other lesion(s) by snare technique Diagnosis Code(s):    --- Professional ---                       Z12.11, Encounter for screening for malignant neoplasm                        of colon                       D12.2, Benign neoplasm of ascending colon                       K64.4, Residual hemorrhoidal skin tags                       K64.8, Other hemorrhoids CPT copyright 2017 American Medical Association. All rights reserved. The codes documented in this report are preliminary and upon coder review may  be revised to meet current compliance  requirements. Dr. Ulyess Mort Lin Landsman MD, MD 03/25/2018 10:06:36 AM This report has been signed electronically. Number of Addenda: 0 Note Initiated On: 03/25/2018 9:10 AM Scope Withdrawal Time: 0 hours 7 minutes 36 seconds  Total Procedure Duration: 0 hours 12 minutes 41 seconds       [facility]

## 2018-03-25 NOTE — OR Nursing (Signed)
PT c/o pain in site of banding. DR Marius Ditch aware. Tramadol adm. Po before discharge. Pt to call if further pain un relieved by tramadol.

## 2018-03-25 NOTE — H&P (Signed)
[name], MD [address]  [place]  [place], [place] [address]  Main: [phone]  Fax: [phone] Pager: [phone]  Primary Care Physician:  Lorelee Market, MD Primary Gastroenterologist:  Dr. [name]  Pre-Procedure History & Physical: HPI:  Crystal [name] is a 67 y.o. female is here for an endoscopy and colonoscopy.   Past Medical History:  Diagnosis Date  . Anemia    TAKES IRON TAB  . Arthritis    SHOULDER  . Bronchitis    HX OF  . Cirrhosis ([place])   . Diabetes mellitus without complication ([place])    TYPE 2  . Full dentures    UPPER AND LOWER    Past Surgical History:  Procedure Laterality Date  . CATARACT EXTRACTION W/PHACO Right 02/06/2018   Procedure: CATARACT EXTRACTION PHACO AND INTRAOCULAR LENS PLACEMENT ([place]) RIGHT DIABETIC;  Surgeon: Leandrew Koyanagi, MD;  Location: [place];  Service: Ophthalmology;  Laterality: Right;  DIABETIC, ORAL MED  . DENTAL SURGERY     EXTRACTIONS  . ESOPHAGOGASTRODUODENOSCOPY (EGD) WITH PROPOFOL N/A 02/10/2018   Procedure: ESOPHAGOGASTRODUODENOSCOPY (EGD) WITH PROPOFOL;  Surgeon: Jonathon Bellows, MD;  Location[facility] ENDOSCOPY;  Service: Gastroenterology;  Laterality: N/A;  . EYE SURGERY      Prior to Admission medications   Medication Sig Start Date End Date Taking? Authorizing Provider  amoxicillin-clavulanate (AUGMENTIN) 875-125 MG tablet Take 1 tablet by mouth 2 (two) times daily. 03/19/18  Yes Norval Gable, MD  ferrous sulfate 325 (65 FE) MG tablet Take 1 tablet (325 mg total) by mouth daily. 02/13/18  Yes Mody, Ulice Bold, MD  furosemide (LASIX) 20 MG tablet Take 1 tablet (20 mg total) by mouth daily. 02/14/18 05/15/18 Yes Lorree Millar, Tally Due, MD  KLOR-CON M20 20 MEQ tablet TAKE 2 TABLETS (40 MEQ TOTAL) BY MOUTH DAILY. 03/21/18  Yes Florentino Laabs, Tally Due, MD  metFORMIN (GLUCOPHAGE) 1000 MG tablet Take 1,000 mg by mouth 2 (two) times daily with a meal. AM AND PM   Yes [provider]    omeprazole (PRILOSEC) 40 MG capsule Take 1 capsule (40 mg total) by mouth daily. 02/12/18 02/12/19 Yes Mody, Ulice Bold, MD  polyethylene glycol powder (GLYCOLAX/MIRALAX) powder Take 17 g by mouth 2 (two) times daily. 02/14/18  Yes Darnice Comrie, Tally Due, MD  propranolol (INDERAL) 20 MG tablet Take 1 tablet (20 mg total) by mouth 2 (two) times daily. 02/14/18 08/13/18 Yes Ellyanna Holton, Tally Due, MD  spironolactone (ALDACTONE) 50 MG tablet Take 1 tablet (50 mg total) by mouth daily. 02/14/18 05/15/18 Yes Mahsa Hanser, Tally Due, MD  vitamin B-12 1000 MCG tablet Take 1 tablet (1,000 mcg total) by mouth daily. 02/13/18  Yes Bettey Costa, MD  Vitamin D, Ergocalciferol, (DRISDOL) 50000 units CAPS capsule TAKE ONE CAPSULE BY MOUTH ONE TIME PER WEEK 03/19/18  Yes Lin Landsman, MD    Allergies as of 02/18/2018  . (No Known Allergies)    Family History  Problem Relation Age of Onset  . CAD Father   . Cancer Maternal Uncle     Social History   Socioeconomic History  . Marital status: Single    Spouse name: Not on file  . Number of children: Not on file  . Years of education: Not on file  . Highest education level: Not on file  Occupational History  . Not on file  Social Needs  . Financial resource strain: Not on file  . Food insecurity:    Worry: Not on file    Inability: Not  on file  . Transportation needs:    Medical: Not on file    Non-medical: Not on file  Tobacco Use  . Smoking status: Never Smoker  . Smokeless tobacco: Never Used  Substance and Sexual Activity  . Alcohol use: No    Frequency: Never  . Drug use: Never  . Sexual activity: Not on file  Lifestyle  . Physical activity:    Days per week: Not on file    Minutes per session: Not on file  . Stress: Not on file  Relationships  . Social connections:    Talks on phone: Not on file    Gets together: Not on file    Attends religious service: Not on file    Active member of club or organization: Not on file    Attends meetings of clubs  or organizations: Not on file    Relationship status: Not on file  . Intimate partner violence:    Fear of current or ex partner: Not on file    Emotionally abused: Not on file    Physically abused: Not on file    Forced sexual activity: Not on file  Other Topics Concern  . Not on file  Social History Narrative   Independent at baseline. Lives at home with family    Review of Systems: See HPI, otherwise negative ROS  Physical Exam: BP 140/76   Pulse 70   Temp (!) 96.9 F (36.1 C) (Tympanic)   Resp 20   Ht 5' 3" (1.6 m)   Wt 167 lb (75.8 kg)   BMI 29.58 kg/m  General:   Alert,  pleasant and cooperative in NAD Head:  Normocephalic and atraumatic. Neck:  Supple; no masses or thyromegaly. Lungs:  Clear throughout to auscultation.    Heart:  Regular rate and rhythm. Abdomen:  Soft, nontender and nondistended. Normal bowel sounds, without guarding, and without rebound.   Neurologic:  Alert and  oriented x4;  grossly normal neurologically.  Impression/Plan: Crystal [name] is here for an endoscopy and colonoscopy to be performed for variceal screening and colon cancer screening  Risks, benefits, limitations, and alternatives regarding  endoscopy and colonoscopy have been reviewed with the patient.  Questions have been answered.  All parties agreeable.   Sherri Sear, MD  03/25/2018, 8:23 AM

## 2018-03-25 NOTE — Anesthesia Preprocedure Evaluation (Addendum)
Anesthesia Evaluation  Patient identified by MRN, date of birth, ID band Patient awake    Reviewed: Allergy & Precautions, NPO status , Patient's Chart, lab work & pertinent test results, reviewed documented beta blocker date and time   Airway Mallampati: II  TM Distance: >3 FB     Dental  (+) Upper Dentures, Lower Dentures, Edentulous Upper, Edentulous Lower, Dental Advidsory Given   Pulmonary neg pulmonary ROS,           Cardiovascular Exercise Tolerance: Good negative cardio ROS       Neuro/Psych negative neurological ROS  negative psych ROS   GI/Hepatic negative GI ROS, (+) Cirrhosis   Esophageal Varices    , Hepatitis -, C  Endo/Other  diabetes, Type 2, Oral Hypoglycemic Agents  Renal/GU negative Renal ROS     Musculoskeletal  (+) Arthritis ,   Abdominal   Peds  Hematology  (+) anemia ,   Anesthesia Other Findings Past Medical History: No date: Anemia     Comment:  TAKES IRON TAB No date: Arthritis     Comment:  SHOULDER No date: Bronchitis     Comment:  HX OF No date: Cirrhosis ([facility]) No date: Diabetes mellitus without complication ([facility])     Comment:  TYPE 2 No date: Full dentures     Comment:  UPPER AND LOWER   Reproductive/Obstetrics negative OB ROS                            Anesthesia Physical  Anesthesia Plan  ASA: III  Anesthesia Plan: General   Post-op Pain Management:    Induction: Intravenous  PONV Risk Score and Plan: 3 and Propofol infusion  Airway Management Planned: Nasal Cannula  Additional Equipment:   Intra-op Plan:   Post-operative Plan:   Informed Consent: I have reviewed the patients History and Physical, chart, labs and discussed the procedure including the risks, benefits and alternatives for the proposed anesthesia with the patient or authorized representative who has indicated his/her understanding and acceptance.     Plan  Discussed with: CRNA and Anesthesiologist  Anesthesia Plan Comments:         Anesthesia Quick Evaluation

## 2018-03-25 NOTE — Transfer of Care (Signed)
Immediate Anesthesia Transfer of Care Note  Patient: Crystal [name]  Procedure(s) Performed: ESOPHAGOGASTRODUODENOSCOPY (EGD) WITH PROPOFOL (N/A ) COLONOSCOPY WITH PROPOFOL (N/A ) HEMORRHOID BANDING  Patient Location: PACU  Anesthesia Type:General  Level of Consciousness: sedated  Airway & Oxygen Therapy: Patient Spontanous Breathing and Patient connected to nasal cannula oxygen  Post-op Assessment: Report given to RN and Post -op Vital signs reviewed and stable  Post vital signs: Reviewed and stable  Last Vitals:  Vitals Value Taken Time  BP 112/64 03/25/2018 10:10 AM  Temp    Pulse 65 03/25/2018 10:11 AM  Resp 21 03/25/2018 10:11 AM  SpO2 99 % 03/25/2018 10:11 AM  Vitals shown include unvalidated device data.  Last Pain:  Vitals:   03/25/18 0814  TempSrc: Tympanic  PainSc: 0-No pain         Complications: No apparent anesthesia complications

## 2018-03-25 NOTE — Anesthesia Postprocedure Evaluation (Signed)
Anesthesia Post Note  Patient: Crystal [name]  Procedure(s) Performed: ESOPHAGOGASTRODUODENOSCOPY (EGD) WITH PROPOFOL (N/A ) COLONOSCOPY WITH PROPOFOL (N/A ) HEMORRHOID BANDING  Patient location during evaluation: Endoscopy Anesthesia Type: General Level of consciousness: awake and alert Pain management: pain level controlled Vital Signs Assessment: post-procedure vital signs reviewed and stable Respiratory status: spontaneous breathing, nonlabored ventilation, respiratory function stable and patient connected to nasal cannula oxygen Cardiovascular status: blood pressure returned to baseline and stable Postop Assessment: no apparent nausea or vomiting Anesthetic complications: no     Last Vitals:  Vitals:   03/25/18 1050 03/25/18 1100  BP: (!) 159/62 (!) 156/71  Pulse: (!) 56 (!) 58  Resp: 14 16  Temp:    SpO2: 94% 95%    Last Pain:  Vitals:   03/25/18 0814  TempSrc: Tympanic  PainSc: 0-No pain                 Martha Clan

## 2018-03-25 NOTE — Anesthesia Post-op Follow-up Note (Signed)
Anesthesia QCDR form completed.

## 2018-03-25 NOTE — Anesthesia Procedure Notes (Signed)
Date/Time: 03/25/2018 9:18 AM Performed by: Johnna Acosta, CRNA Pre-anesthesia Checklist: Patient identified, Emergency Drugs available, Suction available, Patient being monitored and Timeout performed Patient Re-evaluated:Patient Re-evaluated prior to induction Oxygen Delivery Method: Nasal cannula Preoxygenation: Pre-oxygenation with 100% oxygen

## 2018-03-25 NOTE — Op Note (Signed)
[facility] Gastroenterology Patient Name: Crystal [name] Procedure Date: 03/25/2018 9:10 AM MRN: [id number] Account #: [id number] Date of Birth: January 25, 1951 Admit Type: Outpatient Age: 67 Room: [facility] ENDO ROOM 2 Gender: Female Note Status: Finalized Procedure:            Upper GI endoscopy Indications:          For therapy of esophageal varices Providers:            Lin Landsman MD, MD Referring MD:         Meindert A. Brunetta Genera, MD (Referring MD) Medicines:            Monitored Anesthesia Care Complications:        No immediate complications. Estimated blood loss:                        Minimal. Procedure:            Pre-Anesthesia Assessment:                       - Prior to the procedure, a History and Physical was                        performed, and patient medications and allergies were                        reviewed. The patient is competent. The risks and                        benefits of the procedure and the sedation options and                        risks were discussed with the patient. All questions                        were answered and informed consent was obtained.                        Patient identification and proposed procedure were                        verified by the physician, the nurse, the                        anesthesiologist, the anesthetist and the technician in                        the pre-procedure area in the procedure room in the                        endoscopy suite. Mental Status Examination: alert and                        oriented. Airway Examination: normal oropharyngeal                        airway and neck mobility. Respiratory Examination:                        clear to auscultation. CV Examination: normal.  Prophylactic Antibiotics: The patient does not require                        prophylactic antibiotics. Prior Anticoagulants: The                        patient has taken no  previous anticoagulant or                        antiplatelet agents. ASA Grade Assessment: III - A                        patient with severe systemic disease. After reviewing                        the risks and benefits, the patient was deemed in                        satisfactory condition to undergo the procedure. The                        anesthesia plan was to use monitored anesthesia care                        (MAC). Immediately prior to administration of                        medications, the patient was re-assessed for adequacy                        to receive sedatives. The heart rate, respiratory rate,                        oxygen saturations, blood pressure, adequacy of                        pulmonary ventilation, and response to care were                        monitored throughout the procedure. The physical status                        of the patient was re-assessed after the procedure.                       After obtaining informed consent, the endoscope was                        passed under direct vision. Throughout the procedure,                        the patient's blood pressure, pulse, and oxygen                        saturations were monitored continuously. The Endoscope                        was introduced through the mouth, and advanced to the  third part of duodenum. The upper GI endoscopy was                        accomplished without difficulty. The patient tolerated                        the procedure well. Findings:      Two angioectasias without bleeding were found in the second portion of       the duodenum. Coagulation for hemostasis using argon plasma was       successful.      A few diminutive no bleeding angioectasias were found in the prepyloric       region of the stomach. Coagulation for hemostasis using argon plasma was       successful.      Four non-bleeding superficial gastric ulcers with a clean ulcer base        (Forrest Class III) were found in the gastric fundus. The largest lesion       was 5 mm in largest dimension.      Four columns of non-bleeding large (> 5 mm) varices were found in the       lower third of the esophagus, 38 cm from the incisors. No stigmata of       recent bleeding were evident and no red wale signs were present.       Scarring from prior treatment was visible. Evidence of partial       eradication was visible. Four bands were successfully placed with       incomplete eradication of varices. There was no bleeding during, and at       the end, of the procedure. Estimated blood loss was minimal. Impression:           - Two non-bleeding angioectasias in the duodenum.                        Treated with argon plasma coagulation (APC).                       - A few non-bleeding angioectasias in the stomach.                        Treated with argon plasma coagulation (APC).                       - Non-bleeding gastric ulcers with a clean ulcer base                        (Forrest Class III).                       - Non-bleeding large (> 5 mm) esophageal varices.                        Incompletely eradicated. Banded.                       - No specimens collected. Recommendation:       - Repeat upper endoscopy in 4 weeks for endoscopic band                        ligation.                       -  Use Prilosec (omeprazole) 40 mg PO BID for 3 months.                       - Check H Pylori IgG, if positive will empirically                        treat for H Pylori                       - Proceed with colonoscopy as scheduled                       - See colonoscopy report Procedure Code(s):    --- Professional ---                       [id number], Esophagogastroduodenoscopy, flexible, transoral;                        with band ligation of esophageal/gastric varices                       43255, 59, Esophagogastroduodenoscopy, flexible,                        transoral; with control  of bleeding, any method Diagnosis Code(s):    --- Professional ---                       J62.831, Angiodysplasia of stomach and duodenum without                        bleeding                       K25.9, Gastric ulcer, unspecified as acute or chronic,                        without hemorrhage or perforation                       I85.00, Esophageal varices without bleeding CPT copyright 2017 American Medical Association. All rights reserved. The codes documented in this report are preliminary and upon coder review may  be revised to meet current compliance requirements. Dr. Ulyess Mort Lin Landsman MD, MD 03/25/2018 9:50:18 AM This report has been signed electronically. Number of Addenda: 0 Note Initiated On: 03/25/2018 9:10 AM      [facility]

## 2018-03-26 LAB — SURGICAL PATHOLOGY

## 2018-03-27 ENCOUNTER — Ambulatory Visit: Payer: Medicare HMO

## 2018-03-27 ENCOUNTER — Encounter: Payer: Self-pay | Admitting: Gastroenterology

## 2018-03-27 ENCOUNTER — Ambulatory Visit: Admit: 2018-03-27 | Payer: Medicare HMO | Admitting: Ophthalmology

## 2018-03-27 SURGERY — PHACOEMULSIFICATION, CATARACT, WITH IOL INSERTION
Anesthesia: Topical | Laterality: Left

## 2018-03-29 ENCOUNTER — Other Ambulatory Visit: Payer: Self-pay

## 2018-03-29 DIAGNOSIS — Z8719 Personal history of other diseases of the digestive system: Secondary | ICD-10-CM

## 2018-04-01 ENCOUNTER — Telehealth: Payer: Self-pay | Admitting: *Deleted

## 2018-04-01 NOTE — Telephone Encounter (Signed)
Per patient called and said that  she R/S her CT Chest out until 05/15/18  She stated that she would be out of town and wanted to R/S her MD/Review CT appt. for after CT is done.  MD/Review CT was scheduled for 05/20/18

## 2018-04-03 ENCOUNTER — Inpatient Hospital Stay: Payer: Medicare HMO

## 2018-04-03 DIAGNOSIS — E538 Deficiency of other specified B group vitamins: Secondary | ICD-10-CM

## 2018-04-03 DIAGNOSIS — D510 Vitamin B12 deficiency anemia due to intrinsic factor deficiency: Secondary | ICD-10-CM | POA: Diagnosis not present

## 2018-04-03 DIAGNOSIS — R911 Solitary pulmonary nodule: Secondary | ICD-10-CM | POA: Diagnosis not present

## 2018-04-03 DIAGNOSIS — D500 Iron deficiency anemia secondary to blood loss (chronic): Secondary | ICD-10-CM | POA: Diagnosis not present

## 2018-04-03 DIAGNOSIS — N949 Unspecified condition associated with female genital organs and menstrual cycle: Secondary | ICD-10-CM | POA: Diagnosis not present

## 2018-04-03 DIAGNOSIS — Z7189 Other specified counseling: Secondary | ICD-10-CM | POA: Diagnosis not present

## 2018-04-03 MED ORDER — CYANOCOBALAMIN 1000 MCG/ML IJ SOLN
1000.0000 ug | Freq: Once | INTRAMUSCULAR | Status: AC
  Administered 2018-04-03: 1000 ug via INTRAMUSCULAR

## 2018-04-10 ENCOUNTER — Inpatient Hospital Stay: Payer: Medicare HMO

## 2018-04-10 DIAGNOSIS — R911 Solitary pulmonary nodule: Secondary | ICD-10-CM | POA: Diagnosis not present

## 2018-04-10 DIAGNOSIS — E538 Deficiency of other specified B group vitamins: Secondary | ICD-10-CM | POA: Diagnosis not present

## 2018-04-10 DIAGNOSIS — D510 Vitamin B12 deficiency anemia due to intrinsic factor deficiency: Secondary | ICD-10-CM | POA: Diagnosis not present

## 2018-04-10 DIAGNOSIS — D500 Iron deficiency anemia secondary to blood loss (chronic): Secondary | ICD-10-CM | POA: Diagnosis not present

## 2018-04-10 DIAGNOSIS — N949 Unspecified condition associated with female genital organs and menstrual cycle: Secondary | ICD-10-CM | POA: Diagnosis not present

## 2018-04-10 DIAGNOSIS — Z7189 Other specified counseling: Secondary | ICD-10-CM | POA: Diagnosis not present

## 2018-04-10 MED ORDER — CYANOCOBALAMIN 1000 MCG/ML IJ SOLN
1000.0000 ug | Freq: Once | INTRAMUSCULAR | Status: AC
  Administered 2018-04-10: 1000 ug via INTRAMUSCULAR

## 2018-04-12 ENCOUNTER — Encounter: Payer: Self-pay | Admitting: Gastroenterology

## 2018-04-12 ENCOUNTER — Other Ambulatory Visit: Payer: Self-pay

## 2018-04-12 ENCOUNTER — Ambulatory Visit: Payer: Medicare HMO | Admitting: Gastroenterology

## 2018-04-12 VITALS — BP 130/74 | HR 60 | Resp 16 | Ht 63.0 in | Wt 174.6 lb

## 2018-04-12 DIAGNOSIS — K7460 Unspecified cirrhosis of liver: Secondary | ICD-10-CM | POA: Diagnosis not present

## 2018-04-12 DIAGNOSIS — R188 Other ascites: Secondary | ICD-10-CM

## 2018-04-12 DIAGNOSIS — K7581 Nonalcoholic steatohepatitis (NASH): Secondary | ICD-10-CM | POA: Diagnosis not present

## 2018-04-12 NOTE — Patient Instructions (Signed)
1. Increase lasix/furosemide to 32m daily with breakfast 2. Increase spironolactone to 1051mdaily with breakfast 3. Increase propranolol to 2066mwo times daily 4. Check weight daily in the morning and maintain a log 5. F/u in 1week  Please call our office to speak with my nurse TemBarnie [phone] business hours from 8am to 4pm if you have any questions/concerns. During after hours, you will be redirected to on call GI physician. For any emergency please call 911 or go the nearest emergency room.    RohCephas DarbyD [address] [address] LaneuiCapronurBelle PlaineC [id number]: [phone]: [phone]

## 2018-04-12 NOTE — Progress Notes (Signed)
[name], MD [address]  [place]  [place], [place] [address]  Main: [phone]  Fax: [phone]    Gastroenterology Consultation  Referring Provider:     Lorelee Market, MD Primary Care Physician:  Lorelee Market, MD Primary Gastroenterologist:  Dr. [name] Reason for Consultation:     Decompensated cirrhosis        HPI:   Crystal [name] is a 67 y.o. female referred by Dr. Lorelee Market, MD  for consultation & management of decompensated cirrhosis. Patient was recently diagnosed with decompensated cirrhosis, when she presented to [facility] on 02/09/2018 with large volume hematemesis secondary to variceal hemorrhage, underwent upper endoscopy on 02/10/2018 by Dr. Vicente Males, found to have large esophageal varices with red wale signs and underwent variceal ligation x 10. Her bleeding resolved by the time of discharge. Patient was found to have hepatitis C antibodies and HCV virus was not detected. She had other secondary liver disease workup which revealed weakly positive anti-smooth muscle antibodies. She has long-standing history of diabetes, on metformin. She has severe iron deficiency and B12 deficiency, discharged on oral iron and oral B12 supplementation. She was supposed to start nadolol but she did not get prescription filled due to high cost. Patient was just discharged from the hospital 2 days ago  Interval summary: since discharge, patient has been doing fairly well. She denies hematemesis, melena, rectal bleeding. She reports worsening of abdominal distention and swelling of legs which started prior to hospitalization. She is not on diuretics. She is taking iron and B12 supplements, omeprazole.she reports her stools are dark but formed. She wants to know if she can restart drinking Coke. She is not on a low-sodium diet. She denies history of heavy smoking or heavy alcohol use in the past. She denies easy bruising, altered  sleep cycle   Follow-up visit 02/22/2018 Patient is seen by Dr. Mike Gip on it [place] for lung nodule as well as retroperitoneal lymphadenopathy. Workup is in progress. Patient is here for follow-up with regards to her decompensated cirrhosis. She is adhering to medications that I recommended during last visit. She is taking Lasix 20 mg, spironolactone 50 mg, propranolol 20 mg twice a day.She is adhering to a low-salt diet, denies swelling of legs. She has developed cellulitis in her left lower leg and is being started on antibiotic. She says her abdominal distention is improving. She denies melena, hematochezia, hematemesis. She is taking oral iron 3 pills daily and vitamin B12 daily by mouth. She reports that she has been constipated and taking Colace. She otherwise denies any complaints today. She said she had given blood samples for the labs that I ordered last week but I do not see any results. We're calling the lab to find out the status  Follow-up visit 03/15/2018 Patient is seen by Dr. Mike Gip yesterday, undergoing evaluation for lung lesion, plan is to repeat CT scan in 25month There is no concern for retroperitoneal lymphadenopathy. She is scheduled for pelvic ultrasound. She has lost about 27 pounds in last 3 weeks, mostly from aggressive diuresis as she was severely volume overloaded when he saw her in clinic on 02/22/2018. At that time, I increased her diuretics dose. She reports feeling fatigued. Reports that her stools are blackish brown as she is taking oral iron 3 pills daily along with MiraLAX. She denies hematemesis, rectal bleeding. She is adherent to low-salt diet  Follow-up visit 04/12/2018 Patient underwent upper endoscopy with variceal ligation without any  complications. She regained 12 pounds back from worsening ascites and swelling of legs. She is currently on Lasix 20 mg and spironolactone 50 mg daily,  she also decreased propranolol to once a day by herself instead of 2  times daily. She denies black stools, blood in the stools. She is taking MiraLAX once daily, having one to 2 bowel movements sometimes hard. She denies insomnia, daytime somnolence or lethargy or forgetfulness and confusion. She is undergoing eye surgery on 04/30/2018  She is single, lives with a roommate  NSAIDs: none including BC powder or Goody powder  Antiplts/Anticoagulants/Anti thrombotics: none  GI Procedures:  EGD 03/25/2018 - Two non-bleeding angioectasias in the duodenum. Treated with argon plasma coagulation (APC). - A few non-bleeding angioectasias in the stomach. Treated with argon plasma coagulation (APC). - Non-bleeding gastric ulcers with a clean ulcer base (Forrest Class III). - Non-bleeding large (> 5 mm) esophageal varices. Incompletely eradicated. Banded. - No specimens collected.   Colonoscopy 03/25/2018 - One 5 mm polyp in the ascending colon, removed with a cold snare. Resected and retrieved. - Rectal varices. - External hemorrhoids. - The examination was otherwise normal.  DIAGNOSIS:  A. COLON POLYP, ASCENDING; COLD SNARE:  - TUBULAR ADENOMA.  - NEGATIVE FOR HIGH-GRADE DYSPLASIA AND MALIGNANCY.  EGD 02/09/18 The examined duodenum was normal. The stomach was normal. Five columns of non-bleeding grade III varices were found in the lower third of the esophagus,. Stigmata of recent bleeding were evident and red wale signs were present. Ten bands were successfully placed with incomplete eradication of varices. There was no bleeding during, and at the end, of the procedure.  Past Medical History:  Diagnosis Date  . Anemia    TAKES IRON TAB  . Arthritis    SHOULDER  . Bronchitis    HX OF  . Cirrhosis ([place])   . Diabetes mellitus without complication ([place])    TYPE 2  . Full dentures    UPPER AND LOWER    Past Surgical History:  Procedure Laterality Date  . CATARACT EXTRACTION W/PHACO Right 02/06/2018   Procedure: CATARACT EXTRACTION PHACO AND  INTRAOCULAR LENS PLACEMENT ([place]) RIGHT DIABETIC;  Surgeon: Leandrew Koyanagi, MD;  Location: [place];  Service: Ophthalmology;  Laterality: Right;  DIABETIC, ORAL MED  . COLONOSCOPY WITH PROPOFOL N/A 03/25/2018   Procedure: COLONOSCOPY WITH PROPOFOL;  Surgeon: Lin Landsman, MD;  Location: [facility] ENDOSCOPY;  Service: Gastroenterology;  Laterality: N/A;  . DENTAL SURGERY     EXTRACTIONS  . ESOPHAGOGASTRODUODENOSCOPY (EGD) WITH PROPOFOL N/A 02/10/2018   Procedure: ESOPHAGOGASTRODUODENOSCOPY (EGD) WITH PROPOFOL;  Surgeon: Jonathon Bellows, MD;  Location: [facility] ENDOSCOPY;  Service: Gastroenterology;  Laterality: N/A;  . ESOPHAGOGASTRODUODENOSCOPY (EGD) WITH PROPOFOL N/A 03/25/2018   Procedure: ESOPHAGOGASTRODUODENOSCOPY (EGD) WITH PROPOFOL;  Surgeon: Lin Landsman, MD;  Location: [facility] ENDOSCOPY;  Service: Gastroenterology;  Laterality: N/A;  . EYE SURGERY    . HEMORRHOID BANDING  03/25/2018   Procedure: HEMORRHOID BANDING;  Surgeon: Lin Landsman, MD;  Location: [facility] ENDOSCOPY;  Service: Gastroenterology;;     Current Outpatient Medications:  .  ferrous sulfate 325 (65 FE) MG tablet, Take 1 tablet (325 mg total) by mouth daily., Disp: 30 tablet, Rfl: 3 .  furosemide (LASIX) 20 MG tablet, Take 1 tablet (20 mg total) by mouth daily., Disp: 90 tablet, Rfl: 0 .  metFORMIN (GLUCOPHAGE) 1000 MG tablet, Take 1,000 mg by mouth 2 (two) times daily with a meal. AM AND PM, Disp: , Rfl:  .  omeprazole (PRILOSEC) 40  MG capsule, Take 1 capsule (40 mg total) by mouth 2 (two) times daily before a meal., Disp: 180 capsule, Rfl: 0 .  polyethylene glycol powder (GLYCOLAX/MIRALAX) powder, Take 17 g by mouth 2 (two) times daily., Disp: 255 g, Rfl: 2 .  propranolol (INDERAL) 20 MG tablet, Take 1 tablet (20 mg total) by mouth 2 (two) times daily., Disp: 180 tablet, Rfl: 1 .  spironolactone (ALDACTONE) 50 MG tablet, Take 1 tablet (50 mg total) by mouth daily., Disp: 90 tablet, Rfl: 0 .  Vitamin D,  Ergocalciferol, (DRISDOL) 50000 units CAPS capsule, TAKE ONE CAPSULE BY MOUTH ONE TIME PER WEEK, Disp: 4 capsule, Rfl: 0 .  amoxicillin-clavulanate (AUGMENTIN) 875-125 MG tablet, Take 1 tablet by mouth 2 (two) times daily. (Patient not taking: Reported on 04/12/2018), Disp: 20 tablet, Rfl: 0 .  SUPREP BOWEL PREP KIT 17.5-3.13-1.6 GM/177ML SOLN, See admin instructions., Disp: , Rfl: 0   Family History  Problem Relation Age of Onset  . CAD Father   . Cancer Maternal Uncle      Social History   Tobacco Use  . Smoking status: Never Smoker  . Smokeless tobacco: Never Used  Substance Use Topics  . Alcohol use: No    Frequency: Never  . Drug use: Never    Allergies as of 04/12/2018  . (No Known Allergies)    Review of Systems:    All systems reviewed and negative except where noted in HPI.   Physical Exam:  BP 130/74 (BP Location: Left Arm, Patient Position: Sitting, Cuff Size: Normal)   Pulse 60   Resp 16   Ht 5' 3" (1.6 m)   Wt 174 lb 9.6 oz (79.2 kg)   BMI 30.93 kg/m  No LMP recorded. Patient is postmenopausal.  General:   Alert,  Well-developed, well-nourished, pleasant and cooperative in NAD Head:  Normocephalic and atraumatic. Eyes:  Sclera clear, no icterus.   Conjunctiva pink. Ears:  Normal auditory acuity. Nose:  No deformity, discharge, or lesions. Mouth:  No deformity or lesions,oropharynx pink & moist. Neck:  Supple; no masses or thyromegaly. Lungs:  Respirations even and unlabored.  Clear throughout to auscultation.   No wheezes, crackles, or rhonchi. No acute distress. Heart:  Regular rate and rhythm; no murmurs, clicks, rubs, or gallops. Abdomen:  Normal bowel sounds. Soft, non-tender and moderately distended, increased compared to last visit, without masses, hepatosplenomegaly or hernias noted.  No guarding or rebound tenderness.   Rectal: Not performed Msk:  Symmetrical without gross deformities. muscle wasting in bilateral upper extremities Good, equal  movement & strength bilaterally. Pulses:  Normal pulses noted. Extremities:  No clubbing, 2+ edema, No cyanosis. Neurologic:  Alert and oriented x3;  grossly normal neurologically. Skin: telangiectasias on upper anterior chest, No jaundice. Lymph Nodes:  No significant cervical adenopathy. Psych:  Alert and cooperative. Normal mood and affect.  Imaging Studies: Reviewed. CT A/P with contrast on 02/09/2018 revealed mildly enlarged retroperitoneal lymph nodes, largest with 11 mm concerning for metastatic disease or malignancy,18 mm spiculated density in the right lower lobe concerning for malignancy associated with small left pleural effusion  Assessment and Plan:   Crystal [name] is a 67 y.o. Caucasian female with obesity, diabetes on metformin with decompensated cirrhosis secondary to ascites and variceal bleed from esophageal varices status post variceal ligation on 02/10/2018 here for follow-up  Decompensated cirrhosis: child class B, MELD-Na 9 Secondary liver disease workup positive for HCV antibodies, but viral load not detected, anti-smooth muscle antibodies weakly positive. With  history of obesity and diabetes, she probably had NASH that progressed to cirrhosis. Ceruloplasmin, antimitochondrial antibodies, hepatitis B, alpha-1 antitrypsin came back negative Portal hypertension: manifested as ascites, esophageal varices, splenomegaly Ascites/volume overload: recurrence of ascites and currently hyper bulimic secondary to decreased dose of diuretics. Increased to Lasix 40 mg and spironolactone 100 mg daily.suggested her to maintain a log and check weight daily in the morning. Recheck BMP and magnesium in 1 week, continue low-sodium diet. Avoid processed meats, and foods, chips, carbonated beverages. Provided her with educational material about low-sodium diet during initial visit Varices: variceal bleed from esophageal varices, status post EGD on 02/10/2018, EVLx10 increase propranolol to  20 mg twice a day. Repeat EGD for variceal surveillance and banding if necessary, scheduled She will be a good candidate for TIPS if she develops recurrent variceal bleed Continue omeprazole daily Anemia: she has developed severe iron deficiency anemia secondary to variceal hemorrhage. Hemoglobin stable , iron studies revealed improved ferritin levels,continue oral iron once daily. B12 deficiency has resolved. Recheck B12, iron studies in 3 months. There is no evidence of coagulopathy or thrombocytopenia HCC screening: AFP normal, no liver lesions on CT in 01/2018. Recheck AFP and ultrasound liver in 04/2018, will order during next visit PSE: none HRS: none Hepatic hydrothorax: none  Health maintenance:  She is immune to hepatitis B vitamin D levels 29.2 on 03/15/2018 Avoid excess Tylenol and NSAIDs use Colon cancer screening:underwent colonoscopy on 04/11/2018, One 5 mm polyp in the ascending colon, removed with a cold snare, Patch showed tubular adenoma with no evidence of high-grade dysplasia or malignancy. Recommend repeat colonoscopy in 5 years Right lower lobe lung lesion and retroperitoneal lymphadenopathy: follow-up with Dr. Mike Gip, no evidence of retroperitoneal lymphadenopathy. Repeat CT chest in 1 month to evaluate for lung lesion  Follow up in 1 week   [name], MD

## 2018-04-15 ENCOUNTER — Ambulatory Visit: Payer: Medicare HMO

## 2018-04-18 ENCOUNTER — Ambulatory Visit: Payer: Medicare HMO | Admitting: Hematology and Oncology

## 2018-04-19 ENCOUNTER — Encounter: Payer: Self-pay | Admitting: Gastroenterology

## 2018-04-19 ENCOUNTER — Ambulatory Visit: Payer: Medicare HMO | Admitting: Gastroenterology

## 2018-04-19 ENCOUNTER — Encounter: Payer: Self-pay | Admitting: *Deleted

## 2018-04-19 ENCOUNTER — Other Ambulatory Visit: Payer: Self-pay | Admitting: Gastroenterology

## 2018-04-19 VITALS — BP 119/57 | HR 62 | Temp 97.6°F | Ht 63.0 in | Wt 174.2 lb

## 2018-04-19 DIAGNOSIS — R188 Other ascites: Secondary | ICD-10-CM

## 2018-04-19 DIAGNOSIS — I8511 Secondary esophageal varices with bleeding: Secondary | ICD-10-CM

## 2018-04-19 DIAGNOSIS — K7290 Hepatic failure, unspecified without coma: Secondary | ICD-10-CM | POA: Diagnosis not present

## 2018-04-19 DIAGNOSIS — D500 Iron deficiency anemia secondary to blood loss (chronic): Secondary | ICD-10-CM

## 2018-04-19 DIAGNOSIS — K7460 Unspecified cirrhosis of liver: Secondary | ICD-10-CM

## 2018-04-19 NOTE — Progress Notes (Signed)
[name], MD [address]  [place]  [place], [place] [address]  Main: [phone]  Fax: [phone]    Gastroenterology Consultation  Referring Provider:     Lorelee Market, MD Primary Care Physician:  Crystal Market, MD Primary Gastroenterologist:  Dr. [name] Reason for Consultation:     Decompensated cirrhosis        HPI:   Crystal [name] is a 67 y.o. [name] referred by Dr. Lorelee Market, MD  for consultation & management of decompensated cirrhosis. Patient was recently diagnosed with decompensated cirrhosis, when she presented to [facility] on 02/09/2018 with large volume hematemesis secondary to variceal hemorrhage, underwent upper endoscopy on 02/10/2018 by Dr. Vicente [name], found to have large esophageal varices with red wale signs and underwent variceal ligation x 10. Her bleeding resolved by the time of discharge. Patient was found to have hepatitis C antibodies and HCV virus was not detected. She had other secondary liver disease workup which revealed weakly positive anti-smooth muscle antibodies. She has long-standing history of diabetes, on metformin. She has severe iron deficiency and B12 deficiency, discharged on oral iron and oral B12 supplementation. She was supposed to start nadolol but she did not get prescription filled due to high cost. Patient was just discharged from the hospital 2 days ago  Interval summary: since discharge, patient has been doing fairly well. She denies hematemesis, melena, rectal bleeding. She reports worsening of abdominal distention and swelling of legs which started prior to hospitalization. She is not on diuretics. She is taking iron and B12 supplements, omeprazole.she reports her stools are dark but formed. She wants to know if she can restart drinking Coke. She is not on a low-sodium diet. She denies history of heavy smoking or heavy alcohol use in the past. She denies easy bruising, altered  sleep cycle   Follow-up visit 02/22/2018 Patient is seen by Crystal [name] on it [place] for lung nodule as well as retroperitoneal lymphadenopathy. Workup is in progress. Patient is here for follow-up with regards to her decompensated cirrhosis. She is adhering to medications that I recommended during last visit. She is taking Lasix 20 mg, spironolactone 50 mg, propranolol 20 mg twice a day.She is adhering to a low-salt diet, denies swelling of legs. She has developed cellulitis in her left lower leg and is being started on antibiotic. She says her abdominal distention is improving. She denies melena, hematochezia, hematemesis. She is taking oral iron 3 pills daily and vitamin B12 daily by mouth. She reports that she has been constipated and taking Colace. She otherwise denies any complaints today. She said she had given blood samples for the labs that I ordered last week but I do not see any results. We're calling the lab to find out the status  Follow-up visit 03/15/2018 Patient is seen by Crystal [name] yesterday, undergoing evaluation for lung lesion, plan is to repeat CT scan in 47month There is no concern for retroperitoneal lymphadenopathy. She is scheduled for pelvic ultrasound. She has lost about 27 pounds in last 3 weeks, mostly from aggressive diuresis as she was severely volume overloaded when he saw her in clinic on 02/22/2018. At that time, I increased her diuretics dose. She reports feeling fatigued. Reports that her stools are blackish brown as she is taking oral iron 3 pills daily along with MiraLAX. She denies hematemesis, rectal bleeding. She is adherent to low-salt diet  Follow-up visit 04/12/2018 Patient underwent upper endoscopy with variceal ligation without any  complications. She regained 12 pounds back from worsening ascites and swelling of legs. She is currently on Lasix 20 mg and spironolactone 50 mg daily,  she also decreased propranolol to once a day by herself instead of 2  times daily. She denies black stools, blood in the stools. She is taking MiraLAX once daily, having one to 2 bowel movements sometimes hard. She denies insomnia, daytime somnolence or lethargy or forgetfulness and confusion. She is undergoing eye surgery on 04/30/2018  Follow-up visit 04/19/2018 She is on furosemide 40 mg and spironolactone 100 mg daily. Her weight has is stable however, she reports she feels less distended and swelling of legs is better. She has no other complaints today.  She is single, lives with a roommate  NSAIDs: none including BC powder or Goody powder  Antiplts/Anticoagulants/Anti thrombotics: none  GI Procedures:  EGD 03/25/2018 - Two non-bleeding angioectasias in the duodenum. Treated with argon plasma coagulation (APC). - A few non-bleeding angioectasias in the stomach. Treated with argon plasma coagulation (APC). - Non-bleeding gastric ulcers with a clean ulcer base ([place] Class III). - Non-bleeding large (> 5 mm) esophageal varices. Incompletely eradicated. Banded. - No specimens collected.   Colonoscopy 03/25/2018 - One 5 mm polyp in the ascending colon, removed with a cold snare. Resected and retrieved. - Rectal varices. - External hemorrhoids. - The examination was otherwise normal.  DIAGNOSIS:  A. COLON POLYP, ASCENDING; COLD SNARE:  - TUBULAR ADENOMA.  - NEGATIVE FOR HIGH-GRADE DYSPLASIA AND MALIGNANCY.  EGD 02/09/18 The examined duodenum was normal. The stomach was normal. Five columns of non-bleeding grade III varices were found in the lower third of the esophagus,. Stigmata of recent bleeding were evident and red wale signs were present. Ten bands were successfully placed with incomplete eradication of varices. There was no bleeding during, and at the end, of the procedure.  Past Medical History:  Diagnosis Date  . Anemia    TAKES IRON TAB  . Arthritis    SHOULDER  . Bronchitis    HX OF  . Cirrhosis ([place])   . Diabetes mellitus  without complication ([place])    TYPE 2  . Full dentures    UPPER AND LOWER    Past Surgical History:  Procedure Laterality Date  . CATARACT EXTRACTION W/PHACO Right 02/06/2018   Procedure: CATARACT EXTRACTION PHACO AND INTRAOCULAR LENS PLACEMENT ([place]) RIGHT DIABETIC;  Surgeon: Leandrew Koyanagi, MD;  Location: [facility];  Service: Ophthalmology;  Laterality: Right;  DIABETIC, ORAL MED  . COLONOSCOPY WITH PROPOFOL N/A 03/25/2018   Procedure: COLONOSCOPY WITH PROPOFOL;  Surgeon: Lin Landsman, MD;  Location: [facility] ENDOSCOPY;  Service: Gastroenterology;  Laterality: N/A;  . DENTAL SURGERY     EXTRACTIONS  . ESOPHAGOGASTRODUODENOSCOPY (EGD) WITH PROPOFOL N/A 02/10/2018   Procedure: ESOPHAGOGASTRODUODENOSCOPY (EGD) WITH PROPOFOL;  Surgeon: Jonathon Bellows, MD;  Location: [facility] ENDOSCOPY;  Service: Gastroenterology;  Laterality: N/A;  . ESOPHAGOGASTRODUODENOSCOPY (EGD) WITH PROPOFOL N/A 03/25/2018   Procedure: ESOPHAGOGASTRODUODENOSCOPY (EGD) WITH PROPOFOL;  Surgeon: Lin Landsman, MD;  Location: [facility] ENDOSCOPY;  Service: Gastroenterology;  Laterality: N/A;  . EYE SURGERY    . HEMORRHOID BANDING  03/25/2018   Procedure: HEMORRHOID BANDING;  Surgeon: Lin Landsman, MD;  Location: [facility] ENDOSCOPY;  Service: Gastroenterology;;     Current Outpatient Medications:  .  ferrous sulfate 325 (65 FE) MG tablet, Take 1 tablet (325 mg total) by mouth daily., Disp: 30 tablet, Rfl: 3 .  furosemide (LASIX) 20 MG tablet, Take 1 tablet (20 mg total)  by mouth daily., Disp: 90 tablet, Rfl: 0 .  metFORMIN (GLUCOPHAGE) 1000 MG tablet, Take 1,000 mg by mouth 2 (two) times daily with a meal. AM AND PM, Disp: , Rfl:  .  omeprazole (PRILOSEC) 40 MG capsule, Take 1 capsule (40 mg total) by mouth 2 (two) times daily before a meal., Disp: 180 capsule, Rfl: 0 .  polyethylene glycol powder (GLYCOLAX/MIRALAX) powder, Take 17 g by mouth 2 (two) times daily., Disp: 255 g, Rfl: 2 .  propranolol (INDERAL) 20  MG tablet, Take 1 tablet (20 mg total) by mouth 2 (two) times daily., Disp: 180 tablet, Rfl: 1 .  spironolactone (ALDACTONE) 50 MG tablet, Take 1 tablet (50 mg total) by mouth daily., Disp: 90 tablet, Rfl: 0 .  SUPREP BOWEL PREP KIT 17.5-3.13-1.6 GM/177ML SOLN, See admin instructions., Disp: , Rfl: 0 .  Vitamin D, Ergocalciferol, (DRISDOL) 50000 units CAPS capsule, TAKE ONE CAPSULE BY MOUTH ONE TIME PER WEEK, Disp: 4 capsule, Rfl: 0   Family History  Problem Relation Age of Onset  . CAD Father   . Cancer Maternal Uncle      Social History   Tobacco Use  . Smoking status: Never Smoker  . Smokeless tobacco: Never Used  Substance Use Topics  . Alcohol use: No    Frequency: Never  . Drug use: Never    Allergies as of 04/19/2018  . (No Known Allergies)    Review of Systems:    All systems reviewed and negative except where noted in HPI.   Physical Exam:  BP (!) 119/57   Pulse 62   Temp 97.6 F (36.4 C) (Oral)   Ht 5' 3" (1.6 m)   Wt 174 lb 3.2 oz (79 kg)   BMI 30.86 kg/m  No LMP recorded. Patient is postmenopausal.  General:   Alert,  Well-developed, well-nourished, pleasant and cooperative in NAD Head:  Normocephalic and atraumatic. Eyes:  Sclera clear, no icterus.   Conjunctiva pink. Ears:  Normal auditory acuity. Nose:  No deformity, discharge, or lesions. Mouth:  No deformity or lesions,oropharynx pink & moist. Neck:  Supple; no masses or thyromegaly. Lungs:  Respirations even and unlabored.  Clear throughout to auscultation.   No wheezes, crackles, or rhonchi. No acute distress. Heart:  Regular rate and rhythm; no murmurs, clicks, rubs, or gallops. Abdomen:  Normal bowel sounds. Soft, non-tender and moderately distended, slightly improved compared to last visit, without masses, hepatosplenomegaly or hernias noted.  No guarding or rebound tenderness.   Rectal: Not performed Msk:  Symmetrical without gross deformities. muscle wasting in bilateral upper extremities  Good, equal movement & strength bilaterally. Pulses:  Normal pulses noted. Extremities:  No clubbing, 1+ edema, No cyanosis. Neurologic:  Alert and oriented x3;  grossly normal neurologically. Skin: telangiectasias on upper anterior chest, No jaundice. Lymph Nodes:  No significant cervical adenopathy. Psych:  Alert and cooperative. Normal mood and affect.  Imaging Studies: Reviewed. CT A/P with contrast on 02/09/2018 revealed mildly enlarged retroperitoneal lymph nodes, largest with 11 mm concerning for metastatic disease or malignancy,18 mm spiculated density in the right lower lobe concerning for malignancy associated with small left pleural effusion  Assessment and Plan:   Crystal [name] is a 66 y.o. Caucasian [name] with obesity, diabetes on metformin with decompensated cirrhosis secondary to ascites and variceal bleed from esophageal varices status post variceal ligation on 02/10/2018 here for follow-up  Decompensated cirrhosis: child class B, MELD-Na 9 Secondary liver disease workup positive for HCV antibodies, but viral load  not detected, anti-smooth muscle antibodies weakly positive. 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 Ceruloplasmin, antimitochondrial antibodies, hepatitis B, alpha-1 antitrypsin came back negative Portal hypertension: manifested as ascites, esophageal varices, splenomegaly Ascites/volume overload: recurrence of ascites and currently hyper bulimic secondary to decreased dose of diuretics. Increase Lasix to 60 mg and continue spironolactone 100 mg daily. Suggested her to maintain a log and check weight daily in the morning. Recheck BMP and magnesium in 1 week, continue low-sodium diet. Avoid processed meats, and foods, chips, carbonated beverages. Provided her with educational material about low-sodium diet during initial visit Varices: variceal bleed from esophageal varices, status post EGD on 02/10/2018, EVLx10,  status post EGD 03/25/2018 EVLx4, continue propranolol to 20 mg twice a day. Repeat EGD for variceal surveillance and banding if necessary, scheduled She will be a good candidate for TIPS if she develops recurrent variceal bleed Continue omeprazole twice a day Anemia: she has developed severe iron deficiency anemia secondary to variceal hemorrhage. Hemoglobin stable , iron studies revealed improved ferritin levels,continue oral iron once daily. B12 deficiency has resolved. Recheck B12, iron studies in 3 months. There is no evidence of coagulopathy or thrombocytopenia HCC screening: AFP normal, no liver lesions on CT in 01/2018. Recheck AFP and ultrasound liver in 04/2018, will order during next visit PSE: none HRS: none Hepatic hydrothorax: none  Health maintenance:  She is immune to hepatitis B vitamin D levels 29.2 on 03/15/2018 Avoid excess Tylenol and NSAIDs use Colon cancer screening:underwent colonoscopy on 04/11/2018, One 5 mm polyp in the ascending colon, removed with a cold snare, Patch showed tubular adenoma with no evidence of high-grade dysplasia or malignancy. Recommend repeat colonoscopy in 5 years Right lower lobe lung lesion and retroperitoneal lymphadenopathy: follow-up with Crystal [name], no evidence of retroperitoneal lymphadenopathy. Repeat CT chest in 1 month to evaluate for lung lesion  Follow up in 4 weeks   [name], MD

## 2018-04-19 NOTE — Patient Instructions (Addendum)
1. Increase lasix/furosemide to 45m daily after breakfast, decrease to 432mwhen swelling goes down 2. Continue spironolactone 10043maily after lunch  3. Continue propranolol 43m66mD 4. F/u in 4weeks  Please call our office to speak with my nurse [name] business hours from 8am to 4pm if you have any questions/concerns. During after hours, you will be redirected to on call GI physician. For any emergency please call 911 or go the nearest emergency room.    RohiCephas Darby [address] Linden LaneitFernando SalinasrlYarrow Point [id number]: [phone]: [phone]

## 2018-04-20 ENCOUNTER — Other Ambulatory Visit: Payer: Self-pay | Admitting: Gastroenterology

## 2018-04-22 ENCOUNTER — Ambulatory Visit: Payer: Medicare HMO | Admitting: Anesthesiology

## 2018-04-22 ENCOUNTER — Encounter: Payer: Self-pay | Admitting: Anesthesiology

## 2018-04-22 ENCOUNTER — Encounter
Admission: RE | Disposition: A | Discharge status: Home / Self Care | Payer: Self-pay | Source: Ambulatory Visit | Attending: Gastroenterology

## 2018-04-22 ENCOUNTER — Ambulatory Visit
Admission: RE | Admit: 2018-04-22 | Discharge: 2018-04-22 | Disposition: A | Discharge status: Home / Self Care | Payer: Medicare HMO | Source: Ambulatory Visit | Attending: Gastroenterology | Admitting: Gastroenterology

## 2018-04-22 DIAGNOSIS — Z8249 Family history of ischemic heart disease and other diseases of the circulatory system: Secondary | ICD-10-CM | POA: Insufficient documentation

## 2018-04-22 DIAGNOSIS — K766 Portal hypertension: Secondary | ICD-10-CM | POA: Diagnosis not present

## 2018-04-22 DIAGNOSIS — R188 Other ascites: Secondary | ICD-10-CM | POA: Diagnosis not present

## 2018-04-22 DIAGNOSIS — K3189 Other diseases of stomach and duodenum: Secondary | ICD-10-CM | POA: Insufficient documentation

## 2018-04-22 DIAGNOSIS — M1990 Unspecified osteoarthritis, unspecified site: Secondary | ICD-10-CM | POA: Diagnosis not present

## 2018-04-22 DIAGNOSIS — K259 Gastric ulcer, unspecified as acute or chronic, without hemorrhage or perforation: Secondary | ICD-10-CM | POA: Diagnosis not present

## 2018-04-22 DIAGNOSIS — Z8719 Personal history of other diseases of the digestive system: Secondary | ICD-10-CM

## 2018-04-22 DIAGNOSIS — Z79899 Other long term (current) drug therapy: Secondary | ICD-10-CM | POA: Insufficient documentation

## 2018-04-22 DIAGNOSIS — Z7984 Long term (current) use of oral hypoglycemic drugs: Secondary | ICD-10-CM | POA: Insufficient documentation

## 2018-04-22 DIAGNOSIS — M19019 Primary osteoarthritis, unspecified shoulder: Secondary | ICD-10-CM | POA: Insufficient documentation

## 2018-04-22 DIAGNOSIS — Z9841 Cataract extraction status, right eye: Secondary | ICD-10-CM | POA: Diagnosis not present

## 2018-04-22 DIAGNOSIS — K7460 Unspecified cirrhosis of liver: Secondary | ICD-10-CM

## 2018-04-22 DIAGNOSIS — D500 Iron deficiency anemia secondary to blood loss (chronic): Secondary | ICD-10-CM

## 2018-04-22 DIAGNOSIS — I8510 Secondary esophageal varices without bleeding: Secondary | ICD-10-CM | POA: Diagnosis not present

## 2018-04-22 DIAGNOSIS — Z809 Family history of malignant neoplasm, unspecified: Secondary | ICD-10-CM | POA: Diagnosis not present

## 2018-04-22 DIAGNOSIS — E119 Type 2 diabetes mellitus without complications: Secondary | ICD-10-CM | POA: Diagnosis not present

## 2018-04-22 DIAGNOSIS — D649 Anemia, unspecified: Secondary | ICD-10-CM | POA: Diagnosis not present

## 2018-04-22 DIAGNOSIS — I8500 Esophageal varices without bleeding: Secondary | ICD-10-CM | POA: Diagnosis not present

## 2018-04-22 DIAGNOSIS — I8511 Secondary esophageal varices with bleeding: Secondary | ICD-10-CM

## 2018-04-22 HISTORY — PX: ESOPHAGOGASTRODUODENOSCOPY (EGD) WITH PROPOFOL: SHX5813

## 2018-04-22 LAB — BASIC METABOLIC PANEL
Anion gap: 8 (ref 5–15)
BUN: 10 mg/dL (ref 6–20)
CO2: 25 mmol/L (ref 22–32)
Calcium: 8.8 mg/dL — ABNORMAL LOW (ref 8.9–10.3)
Chloride: 104 mmol/L (ref 101–111)
Creatinine, Ser: 0.58 mg/dL (ref 0.44–1.00)
GFR calc Af Amer: >60 mL/min (ref >60)
GFR calc non Af Amer: >60 mL/min (ref >60)
Glucose, Bld: 126 mg/dL — ABNORMAL HIGH (ref 65–99)
Potassium: 4 mmol/L (ref 3.5–5.1)
Sodium: 137 mmol/L (ref 135–145)

## 2018-04-22 LAB — GLUCOSE, CAPILLARY: Glucose-Capillary: 83 mg/dL (ref 65–99)

## 2018-04-22 LAB — MAGNESIUM: Magnesium: 2 mg/dL (ref 1.7–2.4)

## 2018-04-22 SURGERY — ESOPHAGOGASTRODUODENOSCOPY (EGD) WITH PROPOFOL
Anesthesia: General

## 2018-04-22 MED ORDER — FUROSEMIDE 20 MG PO TABS
40.0000 mg | ORAL_TABLET | Freq: Every day | ORAL | 0 refills | Status: DC
Start: 2018-04-22 — End: 2018-05-24

## 2018-04-22 MED ORDER — EPHEDRINE SULFATE 50 MG/ML IJ SOLN
INTRAMUSCULAR | Status: AC
  Filled 2018-04-22: qty 1

## 2018-04-22 MED ORDER — SPIRONOLACTONE 50 MG PO TABS: 100.0000 mg | ORAL_TABLET | Freq: Every day | ORAL | 0 refills | Status: DC

## 2018-04-22 MED ORDER — SODIUM CHLORIDE 0.9 % IV SOLN
INTRAVENOUS | Status: DC
  Administered 2018-04-22: 1000 mL via INTRAVENOUS

## 2018-04-22 MED ORDER — LIDOCAINE HCL (PF) 1 % IJ SOLN
INTRAMUSCULAR | Status: AC
  Administered 2018-04-22: 0.3 mL via INTRADERMAL
  Filled 2018-04-22: qty 2

## 2018-04-22 MED ORDER — PROPOFOL 10 MG/ML IV BOLUS
INTRAVENOUS | Status: AC
  Filled 2018-04-22: qty 20

## 2018-04-22 MED ORDER — PROPOFOL 10 MG/ML IV BOLUS
INTRAVENOUS | Status: DC | PRN
  Administered 2018-04-22: 20 mg via INTRAVENOUS
  Administered 2018-04-22: 30 mg via INTRAVENOUS
  Administered 2018-04-22 (×2): 20 mg via INTRAVENOUS
  Administered 2018-04-22: 90 mg via INTRAVENOUS
  Administered 2018-04-22: 30 mg via INTRAVENOUS

## 2018-04-22 MED ORDER — EPHEDRINE SULFATE-NACL 50-0.9 MG/10ML-% IV SOSY
PREFILLED_SYRINGE | INTRAVENOUS | Status: DC | PRN
  Administered 2018-04-22: 10 mg via INTRAVENOUS

## 2018-04-22 MED ORDER — TRAMADOL HCL 50 MG PO TABS
50.0000 mg | ORAL_TABLET | Freq: Once | ORAL | Status: AC
  Administered 2018-04-22: 50 mg via ORAL

## 2018-04-22 MED ORDER — LIDOCAINE HCL (PF) 1 % IJ SOLN
2.0000 mL | Freq: Once | INTRAMUSCULAR | Status: AC
  Administered 2018-04-22: 0.3 mL via INTRADERMAL

## 2018-04-22 NOTE — Anesthesia Post-op Follow-up Note (Signed)
Anesthesia QCDR form completed.

## 2018-04-22 NOTE — Anesthesia Preprocedure Evaluation (Signed)
Anesthesia Evaluation  Patient identified by MRN, date of birth, ID band Patient awake    Reviewed: Allergy & Precautions, NPO status , Patient's Chart, lab work & pertinent test results, reviewed documented beta blocker date and time   Airway Mallampati: II  TM Distance: >3 FB     Dental  (+) Upper Dentures, Lower Dentures, Edentulous Upper, Edentulous Lower, Dental Advidsory Given   Pulmonary neg pulmonary ROS,           Cardiovascular Exercise Tolerance: Good negative cardio ROS       Neuro/Psych negative neurological ROS  negative psych ROS   GI/Hepatic negative GI ROS, (+) Cirrhosis   Esophageal Varices    , Hepatitis -, C  Endo/Other  diabetes, Type 2, Oral Hypoglycemic Agents  Renal/GU negative Renal ROS     Musculoskeletal  (+) Arthritis ,   Abdominal   Peds  Hematology  (+) anemia ,   Anesthesia Other Findings Past Medical History: No date: Anemia     Comment:  TAKES IRON TAB No date: Arthritis     Comment:  SHOULDER No date: Bronchitis     Comment:  HX OF No date: Cirrhosis (HCC) No date: Diabetes mellitus without complication (HCC)     Comment:  TYPE 2 No date: Full dentures     Comment:  UPPER AND LOWER   Reproductive/Obstetrics negative OB ROS                             Anesthesia Physical  Anesthesia Plan  ASA: III  Anesthesia Plan: General   Post-op Pain Management:    Induction: Intravenous  PONV Risk Score and Plan: 3 and Propofol infusion  Airway Management Planned: Nasal Cannula  Additional Equipment:   Intra-op Plan:   Post-operative Plan:   Informed Consent: I have reviewed the patients History and Physical, chart, labs and discussed the procedure including the risks, benefits and alternatives for the proposed anesthesia with the patient or authorized representative who has indicated his/her understanding and acceptance.     Plan  Discussed with: CRNA and Anesthesiologist  Anesthesia Plan Comments:         Anesthesia Quick Evaluation

## 2018-04-22 NOTE — H&P (Signed)
Crystal Darby, MD [address]  [place]  [place], [place] [address]  Main: [phone]  Fax: [phone] Pager: [phone]  Primary Care Physician:  Lorelee Market, MD Primary Gastroenterologist:  Dr. Cephas [name]  Pre-Procedure History & Physical: HPI:  Crystal [name] is a 67 y.o. female is here for an endoscopy.   Past Medical History:  Diagnosis Date  . Anemia    TAKES IRON TAB  . Arthritis    SHOULDER  . Bronchitis    HX OF  . Cirrhosis ([place])   . Diabetes mellitus without complication ([place])    TYPE 2  . Full dentures    UPPER AND LOWER    Past Surgical History:  Procedure Laterality Date  . CATARACT EXTRACTION W/PHACO Right 02/06/2018   Procedure: CATARACT EXTRACTION PHACO AND INTRAOCULAR LENS PLACEMENT ([place]) RIGHT DIABETIC;  Surgeon: Leandrew Koyanagi, MD;  Location: [facility];  Service: Ophthalmology;  Laterality: Right;  DIABETIC, ORAL MED  . COLONOSCOPY WITH PROPOFOL N/A 03/25/2018   Procedure: COLONOSCOPY WITH PROPOFOL;  Surgeon: Lin Landsman, MD;  Location: [facility] ENDOSCOPY;  Service: Gastroenterology;  Laterality: N/A;  . DENTAL SURGERY     EXTRACTIONS  . ESOPHAGOGASTRODUODENOSCOPY (EGD) WITH PROPOFOL N/A 02/10/2018   Procedure: ESOPHAGOGASTRODUODENOSCOPY (EGD) WITH PROPOFOL;  Surgeon: Jonathon Bellows, MD;  Location: [facility] ENDOSCOPY;  Service: Gastroenterology;  Laterality: N/A;  . ESOPHAGOGASTRODUODENOSCOPY (EGD) WITH PROPOFOL N/A 03/25/2018   Procedure: ESOPHAGOGASTRODUODENOSCOPY (EGD) WITH PROPOFOL;  Surgeon: Lin Landsman, MD;  Location: [facility] ENDOSCOPY;  Service: Gastroenterology;  Laterality: N/A;  . EYE SURGERY    . HEMORRHOID BANDING  03/25/2018   Procedure: HEMORRHOID BANDING;  Surgeon: Lin Landsman, MD;  Location: [facility] ENDOSCOPY;  Service: Gastroenterology;;    Prior to Admission medications   Medication Sig Start Date End Date Taking? Authorizing Provider  ferrous sulfate 325 (65 FE) MG tablet Take 1  tablet (325 mg total) by mouth daily. 02/13/18   Bettey Costa, MD  furosemide (LASIX) 20 MG tablet Take 1 tablet (20 mg total) by mouth daily. 03/25/18 06/23/18  Lin Landsman, MD  metFORMIN (GLUCOPHAGE) 1000 MG tablet Take 1,000 mg by mouth 2 (two) times daily with a meal. AM AND PM    [provider]  omeprazole (PRILOSEC) 40 MG capsule Take 1 capsule (40 mg total) by mouth 2 (two) times daily before a meal. 03/25/18 06/23/18  Vanga, Tally Due, MD  polyethylene glycol powder (GLYCOLAX/MIRALAX) powder Take 17 g by mouth 2 (two) times daily. 02/14/18   Lin Landsman, MD  propranolol (INDERAL) 20 MG tablet Take 1 tablet (20 mg total) by mouth 2 (two) times daily. 02/14/18 08/13/18  Lin Landsman, MD  spironolactone (ALDACTONE) 50 MG tablet TAKE 1 TABLET BY MOUTH EVERY DAY 04/22/18   Vanga, Tally Due, MD  SUPREP BOWEL PREP KIT 17.5-3.13-1.6 GM/177ML SOLN See admin instructions. 03/19/18   [provider]  Vitamin D, Ergocalciferol, (DRISDOL) 50000 units CAPS capsule TAKE ONE CAPSULE BY MOUTH ONE TIME PER WEEK 03/19/18   Lin Landsman, MD    Allergies as of 03/29/2018  . (No Known Allergies)    Family History  Problem Relation Age of Onset  . CAD Father   . Cancer Maternal Uncle     Social History   Socioeconomic History  . Marital status: Single    Spouse name: Not on file  . Number of children: Not on file  . Years of education: Not on file  . Highest education  level: Not on file  Occupational History  . Not on file  Social Needs  . Financial resource strain: Not on file  . Food insecurity:    Worry: Not on file    Inability: Not on file  . Transportation needs:    Medical: Not on file    Non-medical: Not on file  Tobacco Use  . Smoking status: Never Smoker  . Smokeless tobacco: Never Used  Substance and Sexual Activity  . Alcohol use: No    Frequency: Never  . Drug use: Never  . Sexual activity: Not on file  Lifestyle  . Physical  activity:    Days per week: Not on file    Minutes per session: Not on file  . Stress: Not on file  Relationships  . Social connections:    Talks on phone: Not on file    Gets together: Not on file    Attends religious service: Not on file    Active member of club or organization: Not on file    Attends meetings of clubs or organizations: Not on file    Relationship status: Not on file  . Intimate partner violence:    Fear of current or ex partner: Not on file    Emotionally abused: Not on file    Physically abused: Not on file    Forced sexual activity: Not on file  Other Topics Concern  . Not on file  Social History Narrative   Independent at baseline. Lives at home with family    Review of Systems: See HPI, otherwise negative ROS  Physical Exam: BP (!) 157/87   Pulse (!) 54   Resp 17   Ht 5' 3" (1.6 m)   Wt 174 lb (78.9 kg)   SpO2 94%   BMI 30.82 kg/m  General:   Alert,  pleasant and cooperative in NAD Head:  Normocephalic and atraumatic. Neck:  Supple; no masses or thyromegaly. Lungs:  Clear throughout to auscultation.    Heart:  Regular rate and rhythm. Abdomen:  Soft, nontender and nondistended. Normal bowel sounds, without guarding, and without rebound.   Neurologic:  Alert and  oriented x4;  grossly normal neurologically.  Impression/Plan: [name] is here for an endoscopy to be performed for variceal surveillance  Risks, benefits, limitations, and alternatives regarding  endoscopy have been reviewed with the patient.  Questions have been answered.  All parties agreeable.   Sherri Sear, MD  04/22/2018, 9:18 AM

## 2018-04-22 NOTE — Op Note (Signed)
[facility] Gastroenterology Patient Name: Crystal [name] Procedure Date: 04/22/2018 9:53 AM MRN: [id number] Account #: [id number] Date of Birth: Mar 21, 1951 Admit Type: Outpatient Age: 67 Room: [facility] ENDO ROOM 2 Gender: Female Note Status: Finalized Procedure:            Upper GI endoscopy Indications:          Follow-up of esophageal varices, For therapy of                        esophageal varices Providers:            Lin Landsman MD, MD Referring MD:         Lorelee Market (Referring MD) Medicines:            Monitored Anesthesia Care Complications:        No immediate complications. Estimated blood loss:                        Minimal. Procedure:            Pre-Anesthesia Assessment:                       - Prior to the procedure, a History and Physical was                        performed, and patient medications and allergies were                        reviewed. The patient is competent. The risks and                        benefits of the procedure and the sedation options and                        risks were discussed with the patient. All questions                        were answered and informed consent was obtained.                        Patient identification and proposed procedure were                        verified by the physician, the nurse, the                        anesthesiologist, the anesthetist and the technician in                        the pre-procedure area in the procedure room in the                        endoscopy suite. Mental Status Examination: alert and                        oriented. Airway Examination: normal oropharyngeal                        airway and neck mobility. Respiratory Examination:  clear to auscultation. CV Examination: normal.                        Prophylactic Antibiotics: The patient does not require                        prophylactic antibiotics. Prior Anticoagulants: The                        patient has taken no previous anticoagulant or                        antiplatelet agents. ASA Grade Assessment: III - A                        patient with severe systemic disease. After reviewing                        the risks and benefits, the patient was deemed in                        satisfactory condition to undergo the procedure. The                        anesthesia plan was to use monitored anesthesia care                        (MAC). Immediately prior to administration of                        medications, the patient was re-assessed for adequacy                        to receive sedatives. The heart rate, respiratory rate,                        oxygen saturations, blood pressure, adequacy of                        pulmonary ventilation, and response to care were                        monitored throughout the procedure. The physical status                        of the patient was re-assessed after the procedure.                       After obtaining informed consent, the endoscope was                        passed under direct vision. Throughout the procedure,                        the patient's blood pressure, pulse, and oxygen                        saturations were monitored continuously. The Endoscope                        was  introduced through the mouth, and advanced to the                        second part of duodenum. The upper GI endoscopy was                        accomplished without difficulty. The patient tolerated                        the procedure well. Findings:      The duodenal bulb and second portion of the duodenum were normal.      One non-bleeding cratered gastric ulcer with a clean ulcer base (Forrest       Class III) was found on the lesser curvature of the gastric body. The       lesion was 10 mm in largest dimension.      Moderate portal hypertensive gastropathy was found in the gastric fundus       and in the gastric  body.      Large (> 5 mm) varices were found in the lower third of the esophagus.       Two bands were successfully placed with incomplete eradication of       varices. A slow ooze remained at the end of the procedure. This was       controlled with hemospray. Impression:           - Normal duodenal bulb and second portion of the                        duodenum.                       - Non-bleeding gastric ulcer with a clean ulcer base                        (Forrest Class III).                       - Portal hypertensive gastropathy.                       - Large (> 5 mm) esophageal varices. Incompletely                        eradicated. Banded.                       - No specimens collected. Recommendation:       - Discharge patient to home.                       - Resume previous diet today.                       - Continue present medications.                       - Repeat upper endoscopy in 2 months to evaluate the                        response to therapy and for retreatment.                       -  Return to GI office as previously scheduled. Procedure Code(s):    --- Professional ---                       [phone], Esophagogastroduodenoscopy, flexible, transoral;                        with band ligation of esophageal/gastric varices Diagnosis Code(s):    --- Professional ---                       K25.9, Gastric ulcer, unspecified as acute or chronic,                        without hemorrhage or perforation                       K76.6, Portal hypertension                       K31.89, Other diseases of stomach and duodenum                       I85.00, Esophageal varices without bleeding CPT copyright 2017 [employer]. All rights reserved. The codes documented in this report are preliminary and upon coder review may  be revised to meet current compliance requirements. Dr. Ulyess Mort Lin Landsman MD, MD 04/22/2018 10:28:07 AM This report has been  signed electronically. Number of Addenda: 0 Note Initiated On: 04/22/2018 9:53 AM      [facility]

## 2018-04-22 NOTE — Transfer of Care (Signed)
Immediate Anesthesia Transfer of Care Note  Patient: Crystal [name]  Procedure(s) Performed: ESOPHAGOGASTRODUODENOSCOPY (EGD) WITH PROPOFOL with band ligation (N/A )  Patient Location: Endoscopy Unit  Anesthesia Type:General  Level of Consciousness: drowsy and patient cooperative  Airway & Oxygen Therapy: Patient Spontanous Breathing and Patient connected to nasal cannula oxygen  Post-op Assessment: Report given to RN and Post -op Vital signs reviewed and stable  Post vital signs: Reviewed and stable  Last Vitals:  Vitals Value Taken Time  BP 134/61 04/22/2018 10:40 AM  Temp    Pulse 64 04/22/2018 10:41 AM  Resp 23 04/22/2018 10:47 AM  SpO2 94 % 04/22/2018 10:41 AM  Vitals shown include unvalidated device data.  Last Pain:  Vitals:   04/22/18 1030  TempSrc: Tympanic  PainSc:          Complications: No apparent anesthesia complications

## 2018-04-22 NOTE — Anesthesia Postprocedure Evaluation (Signed)
Anesthesia Post Note  Patient: Crystal [name]  Procedure(s) Performed: ESOPHAGOGASTRODUODENOSCOPY (EGD) WITH PROPOFOL with band ligation (N/A )  Patient location during evaluation: Endoscopy Anesthesia Type: General Level of consciousness: awake and alert Pain management: pain level controlled Vital Signs Assessment: post-procedure vital signs reviewed and stable Respiratory status: spontaneous breathing, nonlabored ventilation, respiratory function stable and patient connected to nasal cannula oxygen Cardiovascular status: blood pressure returned to baseline and stable Postop Assessment: no apparent nausea or vomiting Anesthetic complications: no     Last Vitals:  Vitals:   04/22/18 1100 04/22/18 1110  BP: 133/68 137/61  Pulse:  (!) 35  Resp: 15 (!) 26  Temp:    SpO2: 100% (!) 83%    Last Pain:  Vitals:   04/22/18 1030  TempSrc: Tympanic  PainSc:                  Martha Clan

## 2018-04-23 ENCOUNTER — Encounter: Payer: Self-pay | Admitting: Gastroenterology

## 2018-04-24 ENCOUNTER — Encounter: Payer: Self-pay | Admitting: *Deleted

## 2018-04-30 ENCOUNTER — Ambulatory Visit: Payer: Medicare HMO | Admitting: Anesthesiology

## 2018-04-30 ENCOUNTER — Encounter
Admission: RE | Disposition: A | Discharge status: Home / Self Care | Payer: Self-pay | Source: Ambulatory Visit | Attending: Ophthalmology

## 2018-04-30 ENCOUNTER — Encounter: Payer: Self-pay | Admitting: Emergency Medicine

## 2018-04-30 ENCOUNTER — Ambulatory Visit
Admission: RE | Admit: 2018-04-30 | Discharge: 2018-04-30 | Disposition: A | Discharge status: Home / Self Care | Payer: Medicare HMO | Source: Ambulatory Visit | Attending: Ophthalmology | Admitting: Ophthalmology

## 2018-04-30 DIAGNOSIS — E119 Type 2 diabetes mellitus without complications: Secondary | ICD-10-CM | POA: Diagnosis not present

## 2018-04-30 DIAGNOSIS — D500 Iron deficiency anemia secondary to blood loss (chronic): Secondary | ICD-10-CM | POA: Diagnosis not present

## 2018-04-30 DIAGNOSIS — D649 Anemia, unspecified: Secondary | ICD-10-CM | POA: Insufficient documentation

## 2018-04-30 DIAGNOSIS — H2512 Age-related nuclear cataract, left eye: Secondary | ICD-10-CM | POA: Insufficient documentation

## 2018-04-30 DIAGNOSIS — Z79899 Other long term (current) drug therapy: Secondary | ICD-10-CM | POA: Diagnosis not present

## 2018-04-30 HISTORY — PX: CATARACT EXTRACTION W/PHACO: SHX586

## 2018-04-30 HISTORY — DX: Hemorrhage, not elsewhere classified: R58

## 2018-04-30 HISTORY — DX: Other nonspecific abnormal finding of lung field: R91.8

## 2018-04-30 HISTORY — DX: Fatty (change of) liver, not elsewhere classified: K76.0

## 2018-04-30 LAB — GLUCOSE, CAPILLARY: Glucose-Capillary: 72 mg/dL (ref 65–99)

## 2018-04-30 SURGERY — PHACOEMULSIFICATION, CATARACT, WITH IOL INSERTION
Anesthesia: Monitor Anesthesia Care | Site: Eye | Laterality: Left | Wound class: "Clean "

## 2018-04-30 MED ORDER — LIDOCAINE HCL (PF) 4 % IJ SOLN
INTRAMUSCULAR | Status: AC
  Filled 2018-04-30: qty 5

## 2018-04-30 MED ORDER — ARMC OPHTHALMIC DILATING DROPS
1.0000 "application " | OPHTHALMIC | Status: AC
  Administered 2018-04-30 (×3): 1 via OPHTHALMIC

## 2018-04-30 MED ORDER — FENTANYL CITRATE (PF) 100 MCG/2ML IJ SOLN
INTRAMUSCULAR | Status: DC | PRN
  Administered 2018-04-30: 25 ug via INTRAVENOUS

## 2018-04-30 MED ORDER — CARBACHOL 0.01 % IO SOLN
INTRAOCULAR | Status: DC | PRN
  Administered 2018-04-30: .5 mL via INTRAOCULAR

## 2018-04-30 MED ORDER — NA HYALUR & NA CHOND-NA HYALUR 0.4-0.35 ML IO KIT
PACK | INTRAOCULAR | Status: DC | PRN
  Administered 2018-04-30: 1 mL via INTRAOCULAR

## 2018-04-30 MED ORDER — NA HYALUR & NA CHOND-NA HYALUR 0.55-0.5 ML IO KIT
PACK | INTRAOCULAR | Status: AC
  Filled 2018-04-30: qty 1.05

## 2018-04-30 MED ORDER — SODIUM CHLORIDE 0.9 % IV SOLN
INTRAVENOUS | Status: DC
  Administered 2018-04-30: 10:00:00 via INTRAVENOUS

## 2018-04-30 MED ORDER — MOXIFLOXACIN HCL 0.5 % OP SOLN
1.0000 [drp] | OPHTHALMIC | Status: AC
  Administered 2018-04-30 (×3): 1 [drp] via OPHTHALMIC

## 2018-04-30 MED ORDER — LIDOCAINE HCL (PF) 4 % IJ SOLN
INTRAOCULAR | Status: DC | PRN
  Administered 2018-04-30: 2 mL via OPHTHALMIC

## 2018-04-30 MED ORDER — MOXIFLOXACIN HCL 0.5 % OP SOLN
OPHTHALMIC | Status: AC
  Administered 2018-04-30: 1 [drp] via OPHTHALMIC
  Filled 2018-04-30: qty 6

## 2018-04-30 MED ORDER — MIDAZOLAM HCL 2 MG/2ML IJ SOLN
INTRAMUSCULAR | Status: AC
  Filled 2018-04-30: qty 2

## 2018-04-30 MED ORDER — POVIDONE-IODINE 5 % OP SOLN
OPHTHALMIC | Status: DC | PRN
  Administered 2018-04-30: 1 via OPHTHALMIC

## 2018-04-30 MED ORDER — FENTANYL CITRATE (PF) 100 MCG/2ML IJ SOLN
INTRAMUSCULAR | Status: AC
  Filled 2018-04-30: qty 2

## 2018-04-30 MED ORDER — EPINEPHRINE PF 1 MG/ML IJ SOLN
INTRAMUSCULAR | Status: AC
  Filled 2018-04-30: qty 1

## 2018-04-30 MED ORDER — EPINEPHRINE PF 1 MG/ML IJ SOLN
INTRAOCULAR | Status: DC | PRN
  Administered 2018-04-30: 1 mL via OPHTHALMIC

## 2018-04-30 MED ORDER — MOXIFLOXACIN HCL 0.5 % OP SOLN: 1.0000 [drp] | OPHTHALMIC | Status: DC | PRN

## 2018-04-30 MED ORDER — NEOMYCIN-POLYMYXIN-DEXAMETH 0.1 % OP OINT
TOPICAL_OINTMENT | OPHTHALMIC | Status: DC | PRN
  Administered 2018-04-30: 1 via OPHTHALMIC

## 2018-04-30 MED ORDER — POVIDONE-IODINE 5 % OP SOLN
OPHTHALMIC | Status: AC
  Filled 2018-04-30: qty 30

## 2018-04-30 MED ORDER — ARMC OPHTHALMIC DILATING DROPS
OPHTHALMIC | Status: AC
  Administered 2018-04-30: 1 via OPHTHALMIC
  Filled 2018-04-30: qty 0.4

## 2018-04-30 MED ORDER — MIDAZOLAM HCL 2 MG/2ML IJ SOLN
INTRAMUSCULAR | Status: DC | PRN
  Administered 2018-04-30: 1 mg via INTRAVENOUS

## 2018-04-30 SURGICAL SUPPLY — 18 items
GLOVE BIO SURGEON STRL SZ8 (GLOVE) ×2 IMPLANT
GLOVE BIOGEL M 6.5 STRL (GLOVE) ×2 IMPLANT
GLOVE SURG LX 7.5 STRW (GLOVE) ×1
GLOVE SURG LX STRL 7.5 STRW (GLOVE) ×1 IMPLANT
GOWN STRL REUS W/ TWL LRG LVL3 (GOWN DISPOSABLE) ×2 IMPLANT
GOWN STRL REUS W/TWL LRG LVL3 (GOWN DISPOSABLE) ×2
LABEL CATARACT MEDS ST (LABEL) ×2 IMPLANT
LENS IOL TECNIS ITEC 23.5 (Intraocular Lens) ×1 IMPLANT
NDL HPO THNWL 1X22GA REG BVL (NEEDLE) ×1 IMPLANT
NEEDLE SAFETY 22GX1 (NEEDLE) ×1
PACK CATARACT (MISCELLANEOUS) ×2 IMPLANT
PACK CATARACT BRASINGTON LX (MISCELLANEOUS) ×2 IMPLANT
PACK EYE AFTER SURG (MISCELLANEOUS) ×2 IMPLANT
SOL BSS BAG (MISCELLANEOUS) ×2
SOLUTION BSS BAG (MISCELLANEOUS) ×1 IMPLANT
SYR 5ML LL (SYRINGE) ×2 IMPLANT
WATER STERILE IRR 250ML POUR (IV SOLUTION) ×2 IMPLANT
WIPE NON LINTING 3.25X3.25 (MISCELLANEOUS) ×2 IMPLANT

## 2018-04-30 NOTE — Anesthesia Preprocedure Evaluation (Signed)
Anesthesia Evaluation  Patient identified by MRN, date of birth, ID band Patient awake    Reviewed: Allergy & Precautions, NPO status , Patient's Chart, lab work & pertinent test results  History of Anesthesia Complications Negative for: history of anesthetic complications  Airway Mallampati: III  TM Distance: >3 FB Neck ROM: Full    Dental  (+) Lower Dentures, Upper Dentures   Pulmonary    Pulmonary exam normal breath sounds clear to auscultation       Cardiovascular Exercise Tolerance: Good negative cardio ROS Normal cardiovascular exam Rhythm:Regular Rate:Normal     Neuro/Psych negative neurological ROS     GI/Hepatic negative GI ROS, PUD, (+) Cirrhosis   Esophageal Varices    , Hepatitis -, C  Endo/Other  diabetes, Type 2  Renal/GU negative Renal ROS     Musculoskeletal  (+) Arthritis ,   Abdominal   Peds  Hematology  (+) Blood dyscrasia, anemia ,   Anesthesia Other Findings Past Medical History: No date: Anemia     Comment:  TAKES IRON TAB No date: Arthritis     Comment:  SHOULDER No date: Bleeding     Comment:  GI  3/19 No date: Bronchitis     Comment:  HX OF No date: Cirrhosis (HCC) No date: Diabetes mellitus without complication (HCC)     Comment:  TYPE 2 No date: Fatty (change of) liver, not elsewhere classified No date: Full dentures     Comment:  UPPER AND LOWER No date: Lung nodule, multiple  Reproductive/Obstetrics                             Anesthesia Physical  Anesthesia Plan  ASA: III  Anesthesia Plan: MAC   Post-op Pain Management:    Induction: Intravenous  PONV Risk Score and Plan:   Airway Management Planned: Natural Airway  Additional Equipment:   Intra-op Plan:   Post-operative Plan:   Informed Consent: I have reviewed the patients History and Physical, chart, labs and discussed the procedure including the risks, benefits and  alternatives for the proposed anesthesia with the patient or authorized representative who has indicated his/her understanding and acceptance.     Plan Discussed with: CRNA  Anesthesia Plan Comments:         Anesthesia Quick Evaluation

## 2018-04-30 NOTE — Anesthesia Post-op Follow-up Note (Signed)
Anesthesia QCDR form completed.

## 2018-04-30 NOTE — H&P (Signed)
The History and Physical notes are on paper, have been signed, and are to be scanned. The patient remains stable and unchanged from the H&P.   Previous H&P reviewed, patient examined, and there are no changes.  Crystal [name] 04/30/2018 9:31 AM

## 2018-04-30 NOTE — Discharge Instructions (Signed)
Eye Surgery Discharge Instructions  Expect mild scratchy sensation or mild soreness. DO NOT RUB YOUR EYE!  The day of surgery:  Minimal physical activity, but bed rest is not required  No reading, computer work, or close hand work  No bending, lifting, or straining.  May watch TV  For 24 hours:  No driving, legal decisions, or alcoholic beverages  Safety precautions  Eat anything you prefer: It is better to start with liquids, then soup then solid foods.  _____ Eye patch should be worn until postoperative exam tomorrow.  ____ Solar shield eyeglasses should be worn for comfort in the sunlight/patch while sleeping  Resume all regular medications including aspirin or Coumadin if these were discontinued prior to surgery. You may shower, bathe, shave, or wash your hair. Tylenol may be taken for mild discomfort.  Call your doctor if you experience significant pain, nausea, or vomiting, fever > 101 or other signs of infection. [phone] or [phone] Specific instructions:  Follow-up Information    Leandrew Koyanagi, MD Follow up on 04/30/2018.   Specialty:  Ophthalmology Why:  appointment time 2:10 PM Contact information: [address]   [place] [address] [phone]

## 2018-04-30 NOTE — Transfer of Care (Signed)
Immediate Anesthesia Transfer of Care Note  Patient: Crystal [name]  Procedure(s) Performed: CATARACT EXTRACTION PHACO AND INTRAOCULAR LENS PLACEMENT (IOC) (Left Eye)  Patient Location: PACU  Anesthesia Type:MAC  Level of Consciousness: awake, alert  and oriented  Airway & Oxygen Therapy: Patient Spontanous Breathing  Post-op Assessment: Report given to RN and Post -op Vital signs reviewed and stable  Post vital signs: Reviewed and stable  Last Vitals:  Vitals Value Taken Time  BP 121/62 04/30/2018 10:59 AM  Temp    Pulse    Resp    SpO2 98 % 04/30/2018 10:59 AM    Last Pain:  Vitals:   04/30/18 1059  TempSrc:   PainSc: 0-No pain         Complications: No apparent anesthesia complications

## 2018-04-30 NOTE — Anesthesia Postprocedure Evaluation (Signed)
Anesthesia Post Note  Patient: Crystal [name]  Procedure(s) Performed: CATARACT EXTRACTION PHACO AND INTRAOCULAR LENS PLACEMENT ([place]) (Left Eye)  Patient location during evaluation: PACU Anesthesia Type: MAC Level of consciousness: awake and alert Pain management: pain level controlled Vital Signs Assessment: post-procedure vital signs reviewed and stable Respiratory status: spontaneous breathing Cardiovascular status: blood pressure returned to baseline Anesthetic complications: no     Last Vitals:  Vitals:   04/30/18 0912 04/30/18 1059  BP: (!) 156/69 121/62  Pulse: 66   Resp: 19   Temp: 36.7 C   SpO2: 99% 98%    Last Pain:  Vitals:   04/30/18 1059  TempSrc:   PainSc: 0-No pain                 General Wearing Marilynn Rail

## 2018-04-30 NOTE — Op Note (Signed)
OPERATIVE NOTE  Crystal [name] [id number] 04/30/2018   PREOPERATIVE DIAGNOSIS:  Nuclear sclerotic cataract left eye. H25.12   POSTOPERATIVE DIAGNOSIS:    Nuclear sclerotic cataract left eye.     PROCEDURE:  Phacoemusification with posterior chamber intraocular lens placement of the left eye   LENS:   Implant Name Type Inv. Item Serial No. Manufacturer Lot No. LRB No. Used  LENS IOL DIOP 23.5 - [id number] Intraocular Lens LENS IOL DIOP 23.5 [id number] AMO  Left 1        ULTRASOUND TIME: 21  % of 1 minutes 4 seconds, CDE 13.7  SURGEON:  Wyonia Hough, MD   ANESTHESIA:  Topical with tetracaine drops and 2% Xylocaine jelly, augmented with 1% preservative-free intracameral lidocaine.    COMPLICATIONS:  None.   DESCRIPTION OF PROCEDURE:  The patient was identified in the holding room and transported to the operating room and placed in the supine position under the operating microscope.  The left eye was identified as the operative eye and it was prepped and draped in the usual sterile ophthalmic fashion.   A 1 millimeter clear-corneal paracentesis was made at the 1:30 position.  0.5 ml of preservative-free 1% lidocaine was injected into the anterior chamber.  The anterior chamber was filled with Viscoat viscoelastic.  A 2.4 millimeter keratome was used to make a near-clear corneal incision at the 10:30 position.  .  A curvilinear capsulorrhexis was made with a cystotome and capsulorrhexis forceps.  Balanced salt solution was used to hydrodissect and hydrodelineate the nucleus.   Phacoemulsification was then used in stop and chop fashion to remove the lens nucleus and epinucleus.  The remaining cortex was then removed using the irrigation and aspiration handpiece. Provisc was then placed into the capsular bag to distend it for lens placement.  A lens was then injected into the capsular bag.  The remaining viscoelastic was aspirated.   Wounds were hydrated with balanced salt  solution.  The anterior chamber was inflated to a physiologic pressure with balanced salt solution.  No wound leaks were noted. Vigamox 0.2 ml of a 47m per ml solution was injected into the anterior chamber for a dose of 0.2 mg of intracameral antibiotic at the completion of the case.   Timolol and Brimonidine drops were applied to the eye.  The patient was taken to the recovery room in stable condition without complications of anesthesia or surgery.  [name] 04/30/2018, 10:55 AM

## 2018-05-01 ENCOUNTER — Encounter: Payer: Self-pay | Admitting: Ophthalmology

## 2018-05-02 ENCOUNTER — Encounter: Payer: Self-pay | Admitting: Ophthalmology

## 2018-05-02 ENCOUNTER — Other Ambulatory Visit: Payer: Self-pay

## 2018-05-02 DIAGNOSIS — I8500 Esophageal varices without bleeding: Secondary | ICD-10-CM

## 2018-05-08 ENCOUNTER — Inpatient Hospital Stay: Payer: Medicare HMO | Attending: Hematology and Oncology

## 2018-05-08 DIAGNOSIS — D510 Vitamin B12 deficiency anemia due to intrinsic factor deficiency: Secondary | ICD-10-CM | POA: Insufficient documentation

## 2018-05-08 DIAGNOSIS — E538 Deficiency of other specified B group vitamins: Secondary | ICD-10-CM

## 2018-05-08 MED ORDER — CYANOCOBALAMIN 1000 MCG/ML IJ SOLN
1000.0000 ug | Freq: Once | INTRAMUSCULAR | Status: AC
  Administered 2018-05-08: 1000 ug via INTRAMUSCULAR

## 2018-05-14 ENCOUNTER — Other Ambulatory Visit: Payer: Self-pay | Admitting: Gastroenterology

## 2018-05-15 ENCOUNTER — Ambulatory Visit
Admission: RE | Admit: 2018-05-15 | Discharge: 2018-05-15 | Disposition: A | Discharge status: Home / Self Care | Payer: Medicare HMO | Source: Ambulatory Visit | Attending: Urgent Care | Admitting: Urgent Care

## 2018-05-15 DIAGNOSIS — R161 Splenomegaly, not elsewhere classified: Secondary | ICD-10-CM | POA: Insufficient documentation

## 2018-05-15 DIAGNOSIS — R918 Other nonspecific abnormal finding of lung field: Secondary | ICD-10-CM | POA: Insufficient documentation

## 2018-05-15 DIAGNOSIS — R911 Solitary pulmonary nodule: Secondary | ICD-10-CM

## 2018-05-15 DIAGNOSIS — K8020 Calculus of gallbladder without cholecystitis without obstruction: Secondary | ICD-10-CM | POA: Diagnosis not present

## 2018-05-15 DIAGNOSIS — R591 Generalized enlarged lymph nodes: Secondary | ICD-10-CM | POA: Insufficient documentation

## 2018-05-15 DIAGNOSIS — R188 Other ascites: Secondary | ICD-10-CM | POA: Insufficient documentation

## 2018-05-19 NOTE — Progress Notes (Signed)
[facility] day:  05/20/2018  Chief Complaint: Crystal [name] is a 67 y.o. female with 2 right sided pulmonary nodules and lymphadenopathy who seen for review of interval chest CT and pelvic ultrasound and 2 month reassessment.  HPI:  The patient was last seen in the medical oncology clinic on 03/14/2018.  At that time, patient complained of being fatigued. She had increased indigestion despite PPI therapy. Constipation improved with use of Miralax. She was noted to have lost a significant amount of weight (31 pounds) since April due to diuretic therapy used for volume overload related to her decompensated cirrhosis. Exam revealed stigmata of liver disease. She was scheduled for additional testing.   Patient has continued with parenteral B12 supplementation as ordered. Her last injection was on 05/08/2018.   She was seen in follow up consult on 03/15/2018 by Dr. Sherri Sear (gastrointerology). Notes reviewed. 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 Patient is Child-Pugh class B, with a MELD-Na score of 9. Ceruloplasmin, antimitochondrial antibodies, HBV, and alpha-1 antitrypsin all negative. She underwent variceal ligation x 10 back in 01/2018 with Dr. Vicente Males. She was started on Nadolol post-operatively, however patient never filled the prescription due to associated costs. She was alternatively started on propranolol 20 mg BID. Due to significant weight loss, resulting in reduction of volume overload, Lasix was decreased to 20 mg and Aldactone decreased to 50 mg daily.  Plans discussed regarding repeat EGD, with subsequent TIPS procedure if recurrent varices noted. GI to recheck AFP and ultrasound in 6 months.   Pelvic ultrasound performed on 03/18/2018 to further assess adnexal cyst seen on PET imaging from 03/08/2018. Area was not hypermetabolic on PET. Ultrasound demonstrated a simple cyst in the LEFT ovary measuring 6.1 x 4.5 x  6.6 cm. Repeat imaging recommended in 1 year to document stability.   Patient underwent EGD and colonoscopy on 03/25/2018 with Dr. Marius Ditch.  EGD revealed 2 angiectasias (non-bleeding) in the second portion of the duodenum, and a few diminutive (non-bleeding) angiectasias in the prepyloric region of the stomach that were treated APC. There were 4 non-bleeding ulcers (Forrest Class III)  found in the gastric fundus. There were 4 columns of large non-varices in the lower third of the esophagus, of which demonstrated no stigmata of bleeding. Varices were banded. No biopsies were taken. She was scheduled for a repeat EGD in 4 weeks.   Colonoscopy revealed a single 5 mm sessile polyp in the ascending colon. Patient has rectal varices and external hemorrhoids. Pathology returned as tubular adenoma, and was negative for high grade dysplasia and malignancy. Repeat colonoscopy recommended in 5 years.   She was seen in follow up consult on 04/12/2018 by Dr. Sherri Sear. Notes reviewed. Patient had recurrence of volume overload related to her cirrhosis. She had a documented 12 pound weight gain. She was only taking her propranolol once daily, rather than BID as ordered. Lasix increased back to 40 mg and her Aldactone back to 100 mg daily. Patient followed up with GI on 04/19/2018, at which time her diuretic therapy was increased once again; Lasix to 60 mg and Aldactone remained at 100 mg daily. She was scheduled to follow up in 1 month.   Repeat EGD for endoscopic band ligation performed on 04/22/2018 by Dr. Marius Ditch revealed one non-bleeding cratered gastric ulcer (Forrest Class III) in the lesser curvature of the gastric body. Duodenal bulb and second portion of the duodenum were normal. Moderate portal hypertensive gastropathy  noted in the stomach. Large varices in the lower third of the esophagus noted. 2 bands were placed with incomplete eradication of the lesions. A slow ooze remained at the end of the procedure  that was ultimately controlled with hemospray. No biopsies were taken. Patient to undergo repeat EGD and variceal banding in 2 months.   Patient underwent cataract surgery on her LEFT eye on 04/30/2018.  CT imaging of the chest on 05/15/2018 revealed no significant changes in the 2 previously noted nodules in the RIGHT lung. Irregular RML lesion measures 2.4 x 1.4 cm (previously 2.3 x 1.5 cm). Spiculated central RLL lesion measures 1.8 x 1.4 cm (previously 1.6 x 1.5 cm). Both areas remain concerning for neoplasm. There was interval decrease in ascites volume overall. Upper abdominal lymphadenopathy noted that was felt to be reactive due to underlying liver disease.   In the interim, patient is doing well. She denies increased shortness of breath. No cough or chest pain. Ascites is managed with prescribed diuretic therapy. Patient denies bleeding; no hematochezia, melena, or gross hematuria. Patient denies that she has experienced any B symptoms. She denies any interval infections.   Patient advises that she maintains an adequate appetite. She is eating well. Weight today is 167 lb 3 oz (75.8 kg), which compared to her last visit to the clinic, represents a 6 pound increase.  Patient denies pain in the clinic today.   Past Medical History:  Diagnosis Date  . Anemia    TAKES IRON TAB  . Arthritis    SHOULDER  . Bleeding    GI  3/19  . Bronchitis    HX OF  . Cirrhosis ([place])   . Diabetes mellitus without complication ([place])    TYPE 2  . Fatty (change of) liver, not elsewhere classified   . Full dentures    UPPER AND LOWER  . Lung nodule, multiple     Past Surgical History:  Procedure Laterality Date  . CATARACT EXTRACTION W/PHACO Right 02/06/2018   Procedure: CATARACT EXTRACTION PHACO AND INTRAOCULAR LENS PLACEMENT ([place]) RIGHT DIABETIC;  Surgeon: Leandrew Koyanagi, MD;  Location: [facility];  Service: Ophthalmology;  Laterality: Right;  DIABETIC, ORAL MED  . CATARACT  EXTRACTION W/PHACO Left 04/30/2018   Procedure: CATARACT EXTRACTION PHACO AND INTRAOCULAR LENS PLACEMENT ([facility]);  Surgeon: Leandrew Koyanagi, MD;  Location: [facility];  Service: Ophthalmology;  Laterality: Left;  [place] 01:04 AP% 21.3 CDE 13.66 Fluid pack lot # [id number]  . COLONOSCOPY WITH PROPOFOL N/A 03/25/2018   Procedure: COLONOSCOPY WITH PROPOFOL;  Surgeon: Lin Landsman, MD;  Location: [facility] ENDOSCOPY;  Service: Gastroenterology;  Laterality: N/A;  . DENTAL SURGERY     EXTRACTIONS  . ESOPHAGOGASTRODUODENOSCOPY (EGD) WITH PROPOFOL N/A 02/10/2018   Procedure: ESOPHAGOGASTRODUODENOSCOPY (EGD) WITH PROPOFOL;  Surgeon: Jonathon Bellows, MD;  Location: [facility] ENDOSCOPY;  Service: Gastroenterology;  Laterality: N/A;  . ESOPHAGOGASTRODUODENOSCOPY (EGD) WITH PROPOFOL N/A 03/25/2018   Procedure: ESOPHAGOGASTRODUODENOSCOPY (EGD) WITH PROPOFOL;  Surgeon: Lin Landsman, MD;  Location: [facility] ENDOSCOPY;  Service: Gastroenterology;  Laterality: N/A;  . ESOPHAGOGASTRODUODENOSCOPY (EGD) WITH PROPOFOL N/A 04/22/2018   Procedure: ESOPHAGOGASTRODUODENOSCOPY (EGD) WITH PROPOFOL with band ligation;  Surgeon: Lin Landsman, MD;  Location: [facility];  Service: Gastroenterology;  Laterality: N/A;  . EYE SURGERY    . HEMORRHOID BANDING  03/25/2018   Procedure: HEMORRHOID BANDING;  Surgeon: Lin Landsman, MD;  Location: [facility] ENDOSCOPY;  Service: Gastroenterology;;    Family History  Problem Relation Age of Onset  . CAD Father   .  Cancer Maternal Uncle     Social History:  reports that she has never smoked. She has never used smokeless tobacco. She reports that she does not drink alcohol or use drugs.  Patient denies known exposures to radiation on toxins. She retired from [employer] in 11/2017.  The patient is alone today.  Allergies: No Known Allergies  Current Medications: Current Outpatient Medications  Medication Sig Dispense Refill  . ferrous sulfate 325 (65 FE) MG tablet Take 1 tablet  (325 mg total) by mouth daily. 30 tablet 3  . furosemide (LASIX) 20 MG tablet Take 2 tablets (40 mg total) by mouth daily. 180 tablet 0  . metFORMIN (GLUCOPHAGE) 500 MG tablet Take 1,000 mg by mouth 2 (two) times daily as needed (for BS < 100).     Marland Kitchen omeprazole (PRILOSEC) 40 MG capsule Take 1 capsule (40 mg total) by mouth 2 (two) times daily before a meal. 180 capsule 0  . polyethylene glycol powder (GLYCOLAX/MIRALAX) powder Take 17 g by mouth 2 (two) times daily. 255 g 2  . propranolol (INDERAL) 20 MG tablet Take 1 tablet (20 mg total) by mouth 2 (two) times daily. 180 tablet 1  . spironolactone (ALDACTONE) 50 MG tablet Take 2 tablets (100 mg total) by mouth daily. 90 tablet 0  . Vitamin D, Ergocalciferol, (DRISDOL) 50000 units CAPS capsule TAKE ONE CAPSULE BY MOUTH ONE TIME PER WEEK (Patient taking differently: Take 50000 units by mouth once weekly on Thursday) 4 capsule 0   No current facility-administered medications for this visit.     Review of Systems  Constitutional: Positive for malaise/fatigue. Negative for diaphoresis, fever and weight loss.       Feels "ok".  Weight fluctuates based on fluid weight.  HENT: Negative.   Eyes: Negative for pain and redness.       Cataract surgery on LEFT eye done on 04/30/2018.  Respiratory: Negative for cough, hemoptysis, sputum production and shortness of breath.   Cardiovascular: Positive for leg swelling. Negative for chest pain, palpitations, orthopnea and PND.  Gastrointestinal: Positive for abdominal pain (2/2 distention related to cirrhosis), constipation (improved with Miralax) and heartburn. Negative for blood in stool, diarrhea, melena, nausea and vomiting.       Known portal hypertensive gastropathy. Recent varices ligation procedures x 2. On daily PPI.   Genitourinary: Negative for dysuria, frequency, hematuria and urgency.  Musculoskeletal: Negative for back pain, falls, joint pain and myalgias.  Skin: Negative for itching and rash.   Neurological: Negative for dizziness, tremors, weakness and headaches.  Endo/Heme/Allergies: Does not bruise/bleed easily.       T2DM - on Metformin.  Psychiatric/Behavioral: Negative for depression, memory loss and suicidal ideas. The patient is not nervous/anxious and does not have insomnia.   All other systems reviewed and are negative.  Performance status (ECOG): 1 - Symptomatic but completely ambulatory   Physical Exam: Blood pressure 122/72, pulse 71, temperature 98 F (36.7 C), temperature source Tympanic, resp. rate 18, weight 167 lb 3 oz (75.8 kg), SpO2 100 %. GENERAL:  Well developed, well nourished, woman sitting comfortably in the exam room in no acute distress. MENTAL STATUS:  Alert and oriented to person, place and time. HEAD:  White hair.  Normocephalic, atraumatic, face symmetric, no Cushingoid features. EYES:  Glasses.  Blue eyes.  Pupils equal round and reactive to light and accomodation.  No conjunctivitis or scleral icterus. ENT:  Oropharynx clear without lesion.  Tongue normal. Mucous membranes moist.  RESPIRATORY:  Clear to auscultation  without rales, wheezes or rhonchi. CARDIOVASCULAR:  Regular rate and rhythm without murmur, rub or gallop. ABDOMEN:  Soft, non-tender, with active bowel sounds, and no hepatosplenomegaly.  No masses. SKIN:  Scattered facial red spots.  No rashes, ulcers or lesions. EXTREMITIES: No edema, no skin discoloration or tenderness.  No palpable cords. LYMPH NODES: No palpable cervical, supraclavicular, axillary or inguinal adenopathy  NEUROLOGICAL: Unremarkable. PSYCH:  Appropriate.    No visits with results within 3 Day(s) from this visit.  Latest known visit with results is:  Admission on 04/30/2018, Discharged on 04/30/2018  Component Date Value Ref Range Status  . Glucose-Capillary 04/30/2018 72  65 - 99 mg/dL Final    Assessment:  Crystal [name] is a 67 y.o. female with a right lower lobe pulmonary nodule.  She denies any  smoking history.  Abdomen and pelvic CT on 02/09/2018 revealed an 1.8 cm spiculated density in right lower lobe concerning for malignancy. There was mildly nodular hepatic contour concerning for hepatic cirrhosis.  There was moderate splenomegaly suggesting portal venous hypertension.  Mild ascites was noted.  There were mildly enlarged retroperitoneal lymph nodes (largest 1.1 cm) concerning for possible metastatic disease or malignancy.  There was a 7 cm left ovarian cyst.  Further evaluation with MRI was recommended to evaluate for possible neoplasm.  CA125 was 351.3 and CEA 1.1 on 02/21/2018.   Chest CT on 02/25/2018 revealed a 2.3 x 1.5 x 1.0 cm perifissural nodule with spiculated margins in the right middle lobe.  There was a 1.5 x 1.5 x 1.4 cm right lower lobe spiculated nodule.  There was a moderate left sided pleural effusion.    PET scan on 03/08/2018 revealed a 2.5 x 1.8 cm sub solid nodular opacity in the RML (SUV 2.7) and a 1.6 cm rounded slightly spiculated RLL pulmonary nodule (SUV 2.1).  There were no enlarged or hypermetabolic mediastinal or hilar lymph nodes.  There was a small left pleural effusion.  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  Chest CT on 05/15/2018 revealed no significant changes in the 2 previously noted nodules in the RIGHT lung. Irregular RML lesion measures 2.4 x 1.4 cm (previously 2.3 x 1.5 cm). Spiculated central RLL lesion measures 1.8 x 1.4 cm (previously 1.6 x 1.5 cm). Both areas remain concerning for neoplasm. There was interval decrease in ascites volume overall. Upper abdominal lymphadenopathy noted that was felt to be reactive due to underlying liver disease.   She was admitted to [facility] from 02/09/2018 - 02/12/2018 with a variceal hemorrhage.  EGD on 02/10/2018 revealed 5 columns of grade III esophageal varices in the lower third of the esophagus.  There was stigmata of  recent bleeding and red wale signs.  She underwent variceal ligation x 10.   She required 3 units of PRBCs.  EGD on 03/25/2018 revealed 2 angiectasias (non-bleeding) in the second portion of the duodenum, and a few diminutive (non-bleeding) angiectasias in the prepyloric region of the stomach that were treated APC. There were 4 non-bleeding ulcers (Forrest Class III)  found in the gastric fundus. There were 4 columns of large non-varices in the lower third of the esophagus, of which demonstrated no stigmata of bleeding. Varices were banded. Colonoscopy on 03/25/2018 revealed a single 5 mm sessile polyp in the ascending colon. Patient has rectal varices and external hemorrhoids. Pathology returned as tubular adenoma, and was negative for high grade dysplasia and malignancy. EGD on 04/22/2018  revealed one non-bleeding  cratered gastric ulcer (Forrest Class III) in the lesser curvature of the gastric body. Duodenal bulb and second portion of the duodenum were normal. Moderate portal hypertensive gastropathy noted in the stomach. Large varices in the lower third of the esophagus noted. 2 bands were placed with incomplete eradication of the lesions.   She has iron deficiency anemia.  Ferritin was 6.  She is on ferrous sulfate 325 mg TID.  She has B12 deficiency.  B12 was 103.   Intrinsic factor antibody was negative.  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  She began B12 injections on 03/06/2018 (last 05/08/2018).  Diet is good.  She has hepatitis C antibodies.  HCV RNA was not detected. She has been exposed to hepatitis B.  Anti-smooth muscle antibodies are weakly positive.  ANA was positive (> 1:1280 centromere antibody).  Negative studies included: ceruloplasmin, anti-mitochondrial antibodies, hepatitis B IgM, HIV, and alpha-1 antitrypsin.  AFP was normal.  PT was 15.2 (INR 1.21).  Pelvic ultrasound  on 03/18/2018  demonstrated a simple cyst in the LEFT ovary measuring 6.1 x  4.5 x 6.6 cm. Repeat imaging recommended in 1 year to document stability.   Patient underwent cataract surgery on her LEFT eye on 04/30/2018.  Symptomatically, her shortness of breath has improved. Ascites has improved with ongoing diuresis.  She has stigmata of liver disease.  Plan: 1. Review interval GI events. Since last visit, patient has undergone EGD x 2 and colonoscopy.  She has significant sequelae of cirrhosis with portal hypertension and esophageal varices.  In adition, she has angioectasias s/p APC. 2. Review pelvic ultrasound - simple cyst. Needs repeat imaging in 1 year to document stability.  3. Review CT imaging of the chest  No significant changes to the lesions in her RML and RLL.  Lesions remain concerning for neoplasms. Discuss previous recommendations of need for bronchoscopy for definitive diagnosis. Agrees to see Dr. Mortimer Fries. Referral placed.   Upper abdominal adenopathy noted that was felt to be related and related to underlying liver disease. 4. Continue monthly B12 injections (due 06/07/2018).  5. RTC following the bronchoscopy. Will need to call for an appointment.    Honor Loh, NP  05/20/2018, 3:54 PM   I saw and evaluated the patient, participating in the [place] portions of the service and reviewing pertinent diagnostic studies and records.  I reviewed the nurse practitioner's note and agree with the findings and the plan.  The assessment and plan were discussed with the patient.  Multiple questions were asked by the patient and answered.   Nolon Stalls, MD 05/20/2018, 3:54 PM

## 2018-05-20 ENCOUNTER — Encounter: Payer: Self-pay | Admitting: *Deleted

## 2018-05-20 ENCOUNTER — Inpatient Hospital Stay: Payer: Medicare HMO | Attending: Hematology and Oncology | Admitting: Hematology and Oncology

## 2018-05-20 ENCOUNTER — Encounter: Payer: Self-pay | Admitting: Hematology and Oncology

## 2018-05-20 VITALS — BP 122/72 | HR 71 | Temp 98.0°F | Resp 18 | Wt 167.2 lb

## 2018-05-20 DIAGNOSIS — R188 Other ascites: Secondary | ICD-10-CM | POA: Diagnosis not present

## 2018-05-20 DIAGNOSIS — E538 Deficiency of other specified B group vitamins: Secondary | ICD-10-CM | POA: Diagnosis not present

## 2018-05-20 DIAGNOSIS — K7460 Unspecified cirrhosis of liver: Secondary | ICD-10-CM | POA: Diagnosis not present

## 2018-05-20 DIAGNOSIS — R911 Solitary pulmonary nodule: Secondary | ICD-10-CM

## 2018-05-20 DIAGNOSIS — D510 Vitamin B12 deficiency anemia due to intrinsic factor deficiency: Secondary | ICD-10-CM

## 2018-05-20 DIAGNOSIS — K5520 Angiodysplasia of colon without hemorrhage: Secondary | ICD-10-CM | POA: Diagnosis not present

## 2018-05-20 NOTE — Progress Notes (Signed)
Patient offers no complaints today.  Here today for CT results.

## 2018-05-22 NOTE — Progress Notes (Signed)
  Oncology Nurse Navigator Documentation  Navigator Location: [facility]-Med Onc (05/20/18 1600)   )Navigator Encounter Type: Follow-up Appt (05/20/18 1600)                     Patient Visit Type: MedOnc (05/20/18 1600) Treatment Phase: Abnormal Scans (05/20/18 1600) Barriers/Navigation Needs: Coordination of Care (05/20/18 1600)   Interventions: Coordination of Care (05/20/18 1600)   Coordination of Care: Appts;Broncoscopy (05/20/18 1600)         met with patient during follow up visit with Dr. Mike Gip to review results from recent CT scan. All questions answered at the time of visit. Reviewed with patient referral to pulmonary and informed to expect phone call from their office with that appt. Instructed pt to call with any further questions or needs. Pt verbalized understanding. Nothing further needed.          Time Spent with Patient: 60 (05/20/18 1600)

## 2018-05-23 DIAGNOSIS — L040 Acute lymphadenitis of face, head and neck: Secondary | ICD-10-CM | POA: Diagnosis not present

## 2018-05-23 DIAGNOSIS — E669 Obesity, unspecified: Secondary | ICD-10-CM | POA: Diagnosis not present

## 2018-05-23 DIAGNOSIS — E7849 Other hyperlipidemia: Secondary | ICD-10-CM | POA: Diagnosis not present

## 2018-05-23 DIAGNOSIS — K922 Gastrointestinal hemorrhage, unspecified: Secondary | ICD-10-CM | POA: Diagnosis not present

## 2018-05-23 DIAGNOSIS — E119 Type 2 diabetes mellitus without complications: Secondary | ICD-10-CM | POA: Diagnosis not present

## 2018-05-23 DIAGNOSIS — E559 Vitamin D deficiency, unspecified: Secondary | ICD-10-CM | POA: Diagnosis not present

## 2018-05-23 DIAGNOSIS — D649 Anemia, unspecified: Secondary | ICD-10-CM | POA: Diagnosis not present

## 2018-05-24 ENCOUNTER — Other Ambulatory Visit
Admission: RE | Admit: 2018-05-24 | Discharge: 2018-05-24 | Disposition: A | Discharge status: Home / Self Care | Payer: Medicare HMO | Source: Ambulatory Visit | Attending: Gastroenterology | Admitting: Gastroenterology

## 2018-05-24 ENCOUNTER — Encounter: Payer: Self-pay | Admitting: Gastroenterology

## 2018-05-24 ENCOUNTER — Ambulatory Visit (INDEPENDENT_AMBULATORY_CARE_PROVIDER_SITE_OTHER): Payer: Medicare HMO | Admitting: Gastroenterology

## 2018-05-24 ENCOUNTER — Other Ambulatory Visit: Payer: Self-pay

## 2018-05-24 VITALS — BP 115/72 | HR 60 | Resp 16 | Ht 63.0 in | Wt 166.6 lb

## 2018-05-24 DIAGNOSIS — R188 Other ascites: Secondary | ICD-10-CM

## 2018-05-24 DIAGNOSIS — K253 Acute gastric ulcer without hemorrhage or perforation: Secondary | ICD-10-CM | POA: Diagnosis not present

## 2018-05-24 DIAGNOSIS — K7460 Unspecified cirrhosis of liver: Secondary | ICD-10-CM

## 2018-05-24 DIAGNOSIS — I8511 Secondary esophageal varices with bleeding: Secondary | ICD-10-CM

## 2018-05-24 DIAGNOSIS — K7581 Nonalcoholic steatohepatitis (NASH): Secondary | ICD-10-CM

## 2018-05-24 DIAGNOSIS — D500 Iron deficiency anemia secondary to blood loss (chronic): Secondary | ICD-10-CM

## 2018-05-24 DIAGNOSIS — I8510 Secondary esophageal varices without bleeding: Secondary | ICD-10-CM | POA: Diagnosis not present

## 2018-05-24 LAB — COMPREHENSIVE METABOLIC PANEL
ALT: 50 U/L — ABNORMAL HIGH (ref 0–44)
AST: 68 U/L — ABNORMAL HIGH (ref 15–41)
Albumin: 3.5 g/dL (ref 3.5–5.0)
Alkaline Phosphatase: 252 U/L — ABNORMAL HIGH (ref 38–126)
Anion gap: 11 (ref 5–15)
BUN: 12 mg/dL (ref 8–23)
CO2: 25 mmol/L (ref 22–32)
Calcium: 9.5 mg/dL (ref 8.9–10.3)
Chloride: 102 mmol/L (ref 98–111)
Creatinine, Ser: 0.68 mg/dL (ref 0.44–1.00)
GFR calc Af Amer: >60 mL/min (ref >60)
GFR calc non Af Amer: >60 mL/min (ref >60)
Glucose, Bld: 94 mg/dL (ref 70–99)
Potassium: 3.9 mmol/L (ref 3.5–5.1)
Sodium: 138 mmol/L (ref 135–145)
Total Bilirubin: 0.7 mg/dL (ref 0.3–1.2)
Total Protein: 7.8 g/dL (ref 6.5–8.1)

## 2018-05-24 LAB — CBC
HCT: 31.8 % — ABNORMAL LOW (ref 35.0–47.0)
Hemoglobin: 10.4 g/dL — ABNORMAL LOW (ref 12.0–16.0)
MCH: 26 pg (ref 26.0–34.0)
MCHC: 32.8 g/dL (ref 32.0–36.0)
MCV: 79.5 fL — ABNORMAL LOW (ref 80.0–100.0)
Platelets: 167 10*3/uL (ref 150–440)
RBC: 4 MIL/uL (ref 3.80–5.20)
RDW: 16.5 % — ABNORMAL HIGH (ref 11.5–14.5)
WBC: 3.1 10*3/uL — ABNORMAL LOW (ref 3.6–11.0)

## 2018-05-24 LAB — IRON AND TIBC
Iron: 54 ug/dL (ref 28–170)
Saturation Ratios: 14 % (ref 10.4–31.8)
TIBC: 375 ug/dL (ref 250–450)
UIBC: 321 ug/dL

## 2018-05-24 LAB — VITAMIN B12: Vitamin B-12: 485 pg/mL (ref 180–914)

## 2018-05-24 LAB — MAGNESIUM: Magnesium: 1.8 mg/dL (ref 1.7–2.4)

## 2018-05-24 LAB — FERRITIN: Ferritin: 16 ng/mL (ref 11–307)

## 2018-05-24 MED ORDER — OMEPRAZOLE 40 MG PO CPDR: 40.0000 mg | DELAYED_RELEASE_CAPSULE | Freq: Two times a day (BID) | ORAL | 1 refills | Status: DC

## 2018-05-24 MED ORDER — SPIRONOLACTONE 50 MG PO TABS: 100.0000 mg | ORAL_TABLET | Freq: Every day | ORAL | 1 refills | Status: DC

## 2018-05-24 MED ORDER — FUROSEMIDE 20 MG PO TABS: 40.0000 mg | ORAL_TABLET | Freq: Every day | ORAL | 1 refills | Status: DC

## 2018-05-24 NOTE — Progress Notes (Signed)
Crystal Darby, MD [address]  [place], [place] [address]  Main: [phone]  Fax: [phone]    Gastroenterology Consultation  Referring Provider:     Lorelee Market, MD Primary Care Physician:  Lorelee Market, MD Primary Gastroenterologist:  Dr. Cephas [name] Reason for Consultation:     Decompensated cirrhosis and peptic ulcer disease        HPI:   Crystal [name] is a 67 y.o. female referred by Dr. Lorelee Market, MD  for consultation & management of decompensated cirrhosis. Patient was recently diagnosed with decompensated cirrhosis, when she presented to [facility] on 02/09/2018 with large volume hematemesis secondary to variceal hemorrhage, underwent upper endoscopy on 02/10/2018 by Dr. Vicente Males, found to have large esophageal varices with red wale signs and underwent variceal ligation x 10. Her bleeding resolved by the time of discharge. Patient was found to have hepatitis C antibodies and HCV virus was not detected. She had other secondary liver disease workup which revealed weakly positive anti-smooth muscle antibodies. She has long-standing history of diabetes, on metformin. She has severe iron deficiency and B12 deficiency, discharged on oral iron and oral B12 supplementation. She was supposed to start nadolol but she did not get prescription filled due to high cost. Patient was just discharged from the hospital 2 days ago  Interval summary: since discharge, patient has been doing fairly well. She denies hematemesis, melena, rectal bleeding. She reports worsening of abdominal distention and swelling of legs which started prior to hospitalization. She is not on diuretics. She is taking iron and B12 supplements, omeprazole.she reports her stools are dark but formed. She wants to know if she can restart drinking Coke. She is not on a low-sodium diet. She denies history of heavy smoking or heavy alcohol use in the past. She denies  easy bruising, altered sleep cycle   Follow-up visit 02/22/2018 Patient is seen by Dr. Mike Gip on [place] for lung nodule as well as retroperitoneal lymphadenopathy. Workup is in progress. Patient is here for follow-up with regards to her decompensated cirrhosis. She is adhering to medications that I recommended during last visit. She is taking Lasix 20 mg, spironolactone 50 mg, propranolol 20 mg twice a day.She is adhering to a low-salt diet, denies swelling of legs. She has developed cellulitis in her left lower leg and is being started on antibiotic. She says her abdominal distention is improving. She denies melena, hematochezia, hematemesis. She is taking oral iron 3 pills daily and vitamin B12 daily by mouth. She reports that she has been constipated and taking Colace. She otherwise denies any complaints today. She said she had given blood samples for the labs that I ordered last week but I do not see any results. We're calling the lab to find out the status  Follow-up visit 03/15/2018 Patient is seen by Dr. Mike Gip yesterday, undergoing evaluation for lung lesion, plan is to repeat CT scan in 58month There is no concern for retroperitoneal lymphadenopathy. She is scheduled for pelvic ultrasound. She has lost about 27 pounds in last 3 weeks, mostly from aggressive diuresis as she was severely volume overloaded when he saw her in clinic on 02/22/2018. At that time, I increased her diuretics dose. She reports feeling fatigued. Reports that her stools are blackish brown as she is taking oral iron 3 pills daily along with MiraLAX. She denies hematemesis, rectal bleeding. She is adherent to low-salt diet  Follow-up visit 04/12/2018 Patient underwent upper endoscopy with  variceal ligation without any complications. She regained 12 pounds back from worsening ascites and swelling of legs. She is currently on Lasix 20 mg and spironolactone 50 mg daily,  she also decreased propranolol to once a day by  herself instead of 2 times daily. She denies black stools, blood in the stools. She is taking MiraLAX once daily, having one to 2 bowel movements sometimes hard. She denies insomnia, daytime somnolence or lethargy or forgetfulness and confusion. She is undergoing eye surgery on 04/30/2018  Follow-up visit 04/19/2018 She is on furosemide 40 mg and spironolactone 100 mg daily. Her weight has is stable however, she reports she feels less distended and swelling of legs is better. She has no other complaints today.  Follow-up visit 05/24/2018 She underwent EGD since last visit which revealed large esophageal varices and 2 bands were placed. There was some oozing after attempting 3rd banding on one of the varix which stopped after hemospray. She also has antral ulcer, clean-based. She is currently on omeprazole 40 mg twice daily. she's taking furosemide 40 mg, spironolactone 100 mg, propranolol 20 mg twice daily. She is taking oral iron once daily. she is no longer on B12 replacement. She denies black stools, blood in the stools, nausea, vomiting, swelling of legs for abdominal distention  She is single, lives with a roommate  NSAIDs: none including BC powder or Goody powder  Antiplts/Anticoagulants/Anti thrombotics: none  GI Procedures:  EGD 04/22/2018 - Normal duodenal bulb and second portion of the duodenum. - Non-bleeding gastric ulcer with a clean ulcer base (Forrest Class III). - Portal hypertensive gastropathy. - Large (> 5 mm) esophageal varices. Incompletely eradicated. Banded x 2. - No specimens collected.  EGD 03/25/2018 - Two non-bleeding angioectasias in the duodenum. Treated with argon plasma coagulation (APC). - A few non-bleeding angioectasias in the stomach. Treated with argon plasma coagulation (APC). - Non-bleeding gastric ulcers with a clean ulcer base (Forrest Class III). - Non-bleeding large (> 5 mm) esophageal varices. Incompletely eradicated. Banded. - No specimens  collected.   Colonoscopy 03/25/2018 - One 5 mm polyp in the ascending colon, removed with a cold snare. Resected and retrieved. - Rectal varices. - External hemorrhoids. - The examination was otherwise normal.  DIAGNOSIS:  A. COLON POLYP, ASCENDING; COLD SNARE:  - TUBULAR ADENOMA.  - NEGATIVE FOR HIGH-GRADE DYSPLASIA AND MALIGNANCY.  EGD 02/09/18 The examined duodenum was normal. The stomach was normal. Five columns of non-bleeding grade III varices were found in the lower third of the esophagus,. Stigmata of recent bleeding were evident and red wale signs were present. Ten bands were successfully placed with incomplete eradication of varices. There was no bleeding during, and at the end, of the procedure.  Past Medical History:  Diagnosis Date  . Anemia    TAKES IRON TAB  . Arthritis    SHOULDER  . Bleeding    GI  3/19  . Bronchitis    HX OF  . Cirrhosis ([place])   . Diabetes mellitus without complication ([place])    TYPE 2  . Fatty (change of) liver, not elsewhere classified   . Full dentures    UPPER AND LOWER  . Lung nodule, multiple     Past Surgical History:  Procedure Laterality Date  . CATARACT EXTRACTION W/PHACO Right 02/06/2018   Procedure: CATARACT EXTRACTION PHACO AND INTRAOCULAR LENS PLACEMENT ([place]) RIGHT DIABETIC;  Surgeon: Leandrew Koyanagi, MD;  Location: [facility];  Service: Ophthalmology;  Laterality: Right;  DIABETIC, ORAL MED  . CATARACT EXTRACTION  W/PHACO Left 04/30/2018   Procedure: CATARACT EXTRACTION PHACO AND INTRAOCULAR LENS PLACEMENT ([facility]);  Surgeon: Leandrew Koyanagi, MD;  Location: [facility];  Service: Ophthalmology;  Laterality: Left;  [place] 01:04 AP% 21.3 CDE 13.66 Fluid pack lot # [id number]  . COLONOSCOPY WITH PROPOFOL N/A 03/25/2018   Procedure: COLONOSCOPY WITH PROPOFOL;  Surgeon: Lin Landsman, MD;  [facility] ENDOSCOPY;  Service: Gastroenterology;  Laterality: N/A;  . DENTAL SURGERY     EXTRACTIONS  .  ESOPHAGOGASTRODUODENOSCOPY (EGD) WITH PROPOFOL N/A 02/10/2018   Procedure: ESOPHAGOGASTRODUODENOSCOPY (EGD) WITH PROPOFOL;  Surgeon: Jonathon Bellows, MD;  Location: [facility] ENDOSCOPY;  Service: Gastroenterology;  Laterality: N/A;  . ESOPHAGOGASTRODUODENOSCOPY (EGD) WITH PROPOFOL N/A 03/25/2018   Procedure: ESOPHAGOGASTRODUODENOSCOPY (EGD) WITH PROPOFOL;  Surgeon: Lin Landsman, MD;  Location: [facility] ENDOSCOPY;  Service: Gastroenterology;  Laterality: N/A;  . ESOPHAGOGASTRODUODENOSCOPY (EGD) WITH PROPOFOL N/A 04/22/2018   Procedure: ESOPHAGOGASTRODUODENOSCOPY (EGD) WITH PROPOFOL with band ligation;  Surgeon: Lin Landsman, MD;  Location: [facility];  Service: Gastroenterology;  Laterality: N/A;  . EYE SURGERY    . HEMORRHOID BANDING  03/25/2018   Procedure: HEMORRHOID BANDING;  Surgeon: Lin Landsman, MD;  Location: [facility] ENDOSCOPY;  Service: Gastroenterology;;     Current Outpatient Medications:  .  ferrous sulfate 325 (65 FE) MG tablet, Take 1 tablet (325 mg total) by mouth daily., Disp: 30 tablet, Rfl: 3 .  furosemide (LASIX) 20 MG tablet, Take 2 tablets (40 mg total) by mouth daily., Disp: 180 tablet, Rfl: 1 .  metFORMIN (GLUCOPHAGE) 500 MG tablet, Take 1,000 mg by mouth 2 (two) times daily as needed (for BS < 100). , Disp: , Rfl:  .  nystatin (MYCOSTATIN/NYSTOP) powder, , Disp: , Rfl:  .  omeprazole (PRILOSEC) 40 MG capsule, Take 1 capsule (40 mg total) by mouth 2 (two) times daily before a meal., Disp: 180 capsule, Rfl: 1 .  polyethylene glycol powder (GLYCOLAX/MIRALAX) powder, Take 17 g by mouth 2 (two) times daily., Disp: 255 g, Rfl: 2 .  propranolol (INDERAL) 20 MG tablet, Take 1 tablet (20 mg total) by mouth 2 (two) times daily., Disp: 180 tablet, Rfl: 1 .  spironolactone (ALDACTONE) 50 MG tablet, Take 2 tablets (100 mg total) by mouth daily., Disp: 180 tablet, Rfl: 1 .  Vitamin D, Ergocalciferol, (DRISDOL) 50000 units CAPS capsule, TAKE ONE CAPSULE BY MOUTH ONE TIME PER WEEK  (Patient taking differently: Take 50000 units by mouth once weekly on Thursday), Disp: 4 capsule, Rfl: 0   Family History  Problem Relation Age of Onset  . CAD Father   . Cancer Maternal Uncle      Social History   Tobacco Use  . Smoking status: Never Smoker  . Smokeless tobacco: Never Used  Substance Use Topics  . Alcohol use: No    Frequency: Never  . Drug use: Never    Allergies as of 05/24/2018  . (No Known Allergies)    Review of Systems:    All systems reviewed and negative except where noted in HPI.   Physical Exam:  BP 115/72 (BP Location: Left Arm, Patient Position: Sitting, Cuff Size: Large)   Pulse 60   Resp 16   Ht 5' 3" (1.6 m)   Wt 166 lb 9.6 oz (75.6 kg)   BMI 29.51 kg/m  No LMP recorded. Patient is postmenopausal.  General:   Alert,  Well-developed, well-nourished, pleasant and cooperative in NAD Head:  Normocephalic and atraumatic. Eyes:  Sclera clear, no icterus.   Conjunctiva pink. Ears:  Normal auditory acuity. Nose:  No deformity, discharge, or lesions. Mouth:  No deformity or lesions,oropharynx pink & moist. Neck:  Supple; no masses or thyromegaly. Lungs:  Respirations even and unlabored.  Clear throughout to auscultation.   No wheezes, crackles, or rhonchi. No acute distress. Heart:  Regular rate and rhythm; no murmurs, clicks, rubs, or gallops. Abdomen:  Normal bowel sounds. Soft, non-tender and nondistended, slightly improved compared to last visit, without masses, hepatosplenomegaly or hernias noted.  No guarding or rebound tenderness.   Rectal: Not performed Msk:  Symmetrical without gross deformities. muscle wasting in bilateral upper extremities Good, equal movement & strength bilaterally. Pulses:  Normal pulses noted. Extremities:  No clubbing, 1+ edema, No cyanosis. Neurologic:  Alert and oriented x3;  grossly normal neurologically. Skin: telangiectasias on upper anterior chest, No jaundice. Lymph Nodes:  No significant cervical  adenopathy. Psych:  Alert and cooperative. Normal mood and affect.  Imaging Studies: Reviewed. CT A/P with contrast on 02/09/2018 revealed mildly enlarged retroperitoneal lymph nodes, largest with 11 mm concerning for metastatic disease or malignancy,18 mm spiculated density in the right lower lobe concerning for malignancy associated with small left pleural effusion  Assessment and Plan:   Crystal [name] is a 67 y.o. Caucasian female with obesity, diabetes on metformin with decompensated cirrhosis secondary to ascites and variceal bleed from esophageal varices status post variceal ligation here for follow-up  Decompensated cirrhosis: child class B, MELD-Na 9 Secondary liver disease workup positive for HCV antibodies, but viral load not detected, anti-smooth muscle antibodies weakly positive. 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 Ceruloplasmin, antimitochondrial antibodies, hepatitis B, alpha-1 antitrypsin came back negative Portal hypertension: manifested as ascites, esophageal varices, splenomegaly Ascites/volume overload:currently euvolemic, continue Lasix 40 mg and continue spironolactone 100 mg daily. Suggested her to maintain a log and check weight periodically, continue low-sodium diet. Avoid processed meats, and foods, chips, carbonated beverages. Provided her with educational material about low-sodium diet during initial visit Varices: variceal bleed from esophageal varices, status post EGD on 02/10/2018, EVLx10, status post EGD 03/25/2018 EVLx4, s/p EGD 04/22/18 EVLx2 and hemospray, continue propranolol to 20 mg twice a day. Repeat EGD for variceal surveillance and banding if necessary, scheduled for 06/2018 She will be a good candidate for TIPS if she develops recurrent variceal bleed Continue omeprazole twice a day Anemia: multifactorial- combination of peptic ulcer disease,gastric AVMs, portal hypertensive gastropathy, esophageal  varices. she has developed severe iron deficiency anemia secondary to variceal hemorrhage, peptic ulcer disease, gastric AVMs and portal hypertensive gastropathy. Hemoglobin  stable , iron studies revealed improved ferritin levels,continue oral iron once daily. B12 deficiency has resolved. Recheck B12, iron studies today. There is no evidence of coagulopathy or thrombocytopenia HCC screening: AFP normal, no liver lesions on CT in 01/2018. Recheck AFP and ultrasound liver today PSE: none HRS: none Hepatic hydrothorax: none  Health maintenance:  She is immune to hepatitis B vitamin D levels 29.2 on 03/15/2018 Avoid excess Tylenol and NSAIDs use Colon cancer screening:underwent colonoscopy on 04/11/2018, One 5 mm polyp in the ascending colon, removed with a cold snare, Patch showed tubular adenoma with no evidence of high-grade dysplasia or malignancy. Recommend repeat colonoscopy in 5 years Right lower lobe lung lesion and retroperitoneal lymphadenopathy: follow-up with Dr. Mike Gip, no evidence of retroperitoneal lymphadenopathy. Repeat CT chest revealed that the lesions were stable. She was recommended to undergo bronchoscopy and referred to Dr Mortimer Fries. Patient said she will think about it whether to undergo bronchoscopy  are not  Follow up in 3 months   Crystal Darby, MD

## 2018-05-27 ENCOUNTER — Other Ambulatory Visit: Payer: Self-pay | Admitting: Gastroenterology

## 2018-05-27 ENCOUNTER — Other Ambulatory Visit: Payer: Self-pay

## 2018-05-27 ENCOUNTER — Telehealth: Payer: Self-pay | Admitting: Gastroenterology

## 2018-05-27 DIAGNOSIS — K279 Peptic ulcer, site unspecified, unspecified as acute or chronic, without hemorrhage or perforation: Secondary | ICD-10-CM

## 2018-05-27 DIAGNOSIS — K7469 Other cirrhosis of liver: Secondary | ICD-10-CM

## 2018-05-27 MED ORDER — VITAMIN D (ERGOCALCIFEROL) 1.25 MG (50000 UNIT) PO CAPS: ORAL_CAPSULE | ORAL | 0 refills | Status: DC

## 2018-05-27 NOTE — Telephone Encounter (Signed)
Pt is calling to get refill on vitamin D2 1.25 mg 5000 unit to [facility] in [place]

## 2018-05-28 ENCOUNTER — Other Ambulatory Visit
Admission: RE | Admit: 2018-05-28 | Discharge: 2018-05-28 | Disposition: A | Discharge status: Home / Self Care | Payer: Medicare HMO | Source: Ambulatory Visit | Attending: Gastroenterology | Admitting: Gastroenterology

## 2018-05-28 DIAGNOSIS — K7469 Other cirrhosis of liver: Secondary | ICD-10-CM | POA: Insufficient documentation

## 2018-05-28 DIAGNOSIS — K279 Peptic ulcer, site unspecified, unspecified as acute or chronic, without hemorrhage or perforation: Secondary | ICD-10-CM | POA: Diagnosis present

## 2018-05-28 DIAGNOSIS — Z961 Presence of intraocular lens: Secondary | ICD-10-CM | POA: Diagnosis not present

## 2018-05-29 ENCOUNTER — Ambulatory Visit
Admission: RE | Admit: 2018-05-29 | Discharge: 2018-05-29 | Disposition: A | Discharge status: Home / Self Care | Payer: Medicare HMO | Source: Ambulatory Visit | Attending: Gastroenterology | Admitting: Gastroenterology

## 2018-05-29 DIAGNOSIS — K7460 Unspecified cirrhosis of liver: Secondary | ICD-10-CM | POA: Diagnosis not present

## 2018-05-29 DIAGNOSIS — I8510 Secondary esophageal varices without bleeding: Secondary | ICD-10-CM | POA: Insufficient documentation

## 2018-05-29 DIAGNOSIS — K8020 Calculus of gallbladder without cholecystitis without obstruction: Secondary | ICD-10-CM | POA: Insufficient documentation

## 2018-05-29 DIAGNOSIS — K7581 Nonalcoholic steatohepatitis (NASH): Secondary | ICD-10-CM | POA: Insufficient documentation

## 2018-05-29 LAB — H. PYLORI ANTIBODY, IGG: H Pylori IgG: <0.8 Index Value (ref 0.00–0.79)

## 2018-05-29 LAB — AFP TUMOR MARKER: AFP, Serum, Tumor Marker: 1.5 ng/mL (ref 0.0–8.3)

## 2018-06-05 ENCOUNTER — Ambulatory Visit: Payer: Medicare HMO

## 2018-06-07 ENCOUNTER — Inpatient Hospital Stay: Payer: Medicare HMO

## 2018-06-07 ENCOUNTER — Other Ambulatory Visit: Payer: Self-pay | Admitting: Urgent Care

## 2018-06-07 DIAGNOSIS — E538 Deficiency of other specified B group vitamins: Secondary | ICD-10-CM | POA: Diagnosis not present

## 2018-06-07 DIAGNOSIS — R188 Other ascites: Secondary | ICD-10-CM | POA: Diagnosis not present

## 2018-06-07 DIAGNOSIS — K7460 Unspecified cirrhosis of liver: Secondary | ICD-10-CM | POA: Diagnosis not present

## 2018-06-07 DIAGNOSIS — K5520 Angiodysplasia of colon without hemorrhage: Secondary | ICD-10-CM | POA: Diagnosis not present

## 2018-06-07 DIAGNOSIS — D510 Vitamin B12 deficiency anemia due to intrinsic factor deficiency: Secondary | ICD-10-CM | POA: Diagnosis not present

## 2018-06-07 DIAGNOSIS — R911 Solitary pulmonary nodule: Secondary | ICD-10-CM | POA: Diagnosis not present

## 2018-06-07 MED ORDER — CYANOCOBALAMIN 1000 MCG/ML IJ SOLN
1000.0000 ug | Freq: Once | INTRAMUSCULAR | Status: AC
  Administered 2018-06-07: 1000 ug via INTRAMUSCULAR

## 2018-06-10 DIAGNOSIS — R69 Illness, unspecified: Secondary | ICD-10-CM | POA: Diagnosis not present

## 2018-06-13 ENCOUNTER — Inpatient Hospital Stay: Payer: Medicare HMO | Attending: Hematology and Oncology | Admitting: Hematology and Oncology

## 2018-06-13 NOTE — Progress Notes (Unsigned)
[facility] day:  06/13/2018  Chief Complaint: Crystal [name] is a 67 y.o. female with 2 right sided pulmonary nodules and lymphadenopathy who seen for review of interval chest CT and pelvic ultrasound and 2 month reassessment.  HPI:  The patient was last seen in the medical oncology clinic on 05/20/2018.  At that time, her shortness of breath had improved. Ascites had improved with ongoing diuresis.  She had stigmata of liver disease.  EGD had revealed esophageal varices and portal hypertensive gastropathy.  She has angioectasias s/p APC.  She was to be seen by pulmonary medicine regarding her lung lesions.  She saw Dr. Marius Ditch on 05/24/2018. Labs revealed a hematocrit of 31.8, hemoglobin 10.4, MCV 79.5, platelets 167,000, and WBC 3100.  Ferritin was 16 with a saturation of 14% and a TIBC of 375.  B12 was 485.  Creatinine was 0.68.  Alkaline phosphatase was 252.  AST was 68 and ALT 50.  Bilirubin was 0.7.  She has a follow-up in 3 months.    AFP was 1.5 on 05/28/2018.  RUQ ultrasound on 05/29/2018 revealed cholelithiasis. There was a thickened edematous gallbladder wall. The wall thickening could be due to hypoproteinemia/hypoalbuminemia or cholecystitis. However, the patient had a negative sonographic Murphy's sign.  There was a heterogeneous slightly nodular liver consistent with cirrhosis.  She received B12 on 06/07/2018.  During the interim,   Past Medical History:  Diagnosis Date  . Anemia    TAKES IRON TAB  . Arthritis    SHOULDER  . Bleeding    GI  3/19  . Bronchitis    HX OF  . Cirrhosis ([place])   . Diabetes mellitus without complication ([place])    TYPE 2  . Fatty (change of) liver, not elsewhere classified   . Full dentures    UPPER AND LOWER  . Lung nodule, multiple     Past Surgical History:  Procedure Laterality Date  . CATARACT EXTRACTION W/PHACO Right 02/06/2018   Procedure: CATARACT EXTRACTION PHACO AND INTRAOCULAR LENS  PLACEMENT ([place]) RIGHT DIABETIC;  Surgeon: Leandrew Koyanagi, MD;  Location: [place];  Service: Ophthalmology;  Laterality: Right;  DIABETIC, ORAL MED  . CATARACT EXTRACTION W/PHACO Left 04/30/2018   Procedure: CATARACT EXTRACTION PHACO AND INTRAOCULAR LENS PLACEMENT ([facility]);  Surgeon: Leandrew Koyanagi, MD;  Location: [facility];  Service: Ophthalmology;  Laterality: Left;  [place] 01:04 AP% 21.3 CDE 13.66 Fluid pack lot # [id number]  . COLONOSCOPY WITH PROPOFOL N/A 03/25/2018   Procedure: COLONOSCOPY WITH PROPOFOL;  Surgeon: Lin Landsman, MD;  Location: [facility] ENDOSCOPY;  Service: Gastroenterology;  Laterality: N/A;  . DENTAL SURGERY     EXTRACTIONS  . ESOPHAGOGASTRODUODENOSCOPY (EGD) WITH PROPOFOL N/A 02/10/2018   Procedure: ESOPHAGOGASTRODUODENOSCOPY (EGD) WITH PROPOFOL;  Surgeon: Jonathon Bellows, MD;  Location: [facility] ENDOSCOPY;  Service: Gastroenterology;  Laterality: N/A;  . ESOPHAGOGASTRODUODENOSCOPY (EGD) WITH PROPOFOL N/A 03/25/2018   Procedure: ESOPHAGOGASTRODUODENOSCOPY (EGD) WITH PROPOFOL;  Surgeon: Lin Landsman, MD;  Location: [facility] ENDOSCOPY;  Service: Gastroenterology;  Laterality: N/A;  . ESOPHAGOGASTRODUODENOSCOPY (EGD) WITH PROPOFOL N/A 04/22/2018   Procedure: ESOPHAGOGASTRODUODENOSCOPY (EGD) WITH PROPOFOL with band ligation;  Surgeon: Lin Landsman, MD;  Location: [place];  Service: Gastroenterology;  Laterality: N/A;  . EYE SURGERY    . HEMORRHOID BANDING  03/25/2018   Procedure: HEMORRHOID BANDING;  Surgeon: Lin Landsman, MD;  Location: [facility] ENDOSCOPY;  Service: Gastroenterology;;    Family History  Problem Relation Age of Onset  . CAD  Father   . Cancer Maternal Uncle     Social History:  reports that she has never smoked. She has never used smokeless tobacco. She reports that she does not drink alcohol or use drugs.  Patient denies known exposures to radiation on toxins. She retired from [employer] in 11/2017.  The patient is alone  today.  Allergies: No Known Allergies  Current Medications: Current Outpatient Medications  Medication Sig Dispense Refill  . ferrous sulfate 325 (65 FE) MG tablet Take 1 tablet (325 mg total) by mouth daily. 30 tablet 3  . furosemide (LASIX) 20 MG tablet Take 2 tablets (40 mg total) by mouth daily. 180 tablet 1  . metFORMIN (GLUCOPHAGE) 500 MG tablet Take 1,000 mg by mouth 2 (two) times daily as needed (for BS < 100).     . nystatin (MYCOSTATIN/NYSTOP) powder     . omeprazole (PRILOSEC) 40 MG capsule Take 1 capsule (40 mg total) by mouth 2 (two) times daily before a meal. 180 capsule 1  . polyethylene glycol powder (GLYCOLAX/MIRALAX) powder Take 17 g by mouth 2 (two) times daily. 255 g 2  . propranolol (INDERAL) 20 MG tablet Take 1 tablet (20 mg total) by mouth 2 (two) times daily. 180 tablet 1  . spironolactone (ALDACTONE) 50 MG tablet Take 2 tablets (100 mg total) by mouth daily. 180 tablet 1  . Vitamin D, Ergocalciferol, (DRISDOL) 50000 units CAPS capsule TAKE ONE CAPSULE BY MOUTH ONE TIME PER WEEK 4 capsule 0   No current facility-administered medications for this visit.     Review of Systems  Constitutional: Positive for malaise/fatigue. Negative for diaphoresis, fever and weight loss.       Feels "ok".  Weight fluctuates based on fluid weight.  HENT: Negative.   Eyes: Negative for pain and redness.       Cataract surgery on LEFT eye done on 04/30/2018.  Respiratory: Negative for cough, hemoptysis, sputum production and shortness of breath.   Cardiovascular: Positive for leg swelling. Negative for chest pain, palpitations, orthopnea and PND.  Gastrointestinal: Positive for abdominal pain (2/2 distention related to cirrhosis), constipation (improved with Miralax) and heartburn. Negative for blood in stool, diarrhea, melena, nausea and vomiting.       Known portal hypertensive gastropathy. Recent varices ligation procedures x 2. On daily PPI.   Genitourinary: Negative for dysuria,  frequency, hematuria and urgency.  Musculoskeletal: Negative for back pain, falls, joint pain and myalgias.  Skin: Negative for itching and rash.  Neurological: Negative for dizziness, tremors, weakness and headaches.  Endo/Heme/Allergies: Does not bruise/bleed easily.       T2DM - on Metformin.  Psychiatric/Behavioral: Negative for depression, memory loss and suicidal ideas. The patient is not nervous/anxious and does not have insomnia.   All other systems reviewed and are negative.  Performance status (ECOG): 1 - Symptomatic but completely ambulatory   Physical Exam: There were no vitals taken for this visit. GENERAL:  Well developed, well nourished, woman sitting comfortably in the exam room in no acute distress. MENTAL STATUS:  Alert and oriented to person, place and time. HEAD:  White hair.  Normocephalic, atraumatic, face symmetric, no Cushingoid features. EYES:  Glasses.  Blue eyes.  Pupils equal round and reactive to light and accomodation.  No conjunctivitis or scleral icterus. ENT:  Oropharynx clear without lesion.  Tongue normal. Mucous membranes moist.  RESPIRATORY:  Clear to auscultation without rales, wheezes or rhonchi. CARDIOVASCULAR:  Regular rate and rhythm without murmur, rub or gallop. ABDOMEN:  Soft, non-tender, with active bowel sounds, and no hepatosplenomegaly.  No masses. SKIN:  Scattered facial red spots.  No rashes, ulcers or lesions. EXTREMITIES: No edema, no skin discoloration or tenderness.  No palpable cords. LYMPH NODES: No palpable cervical, supraclavicular, axillary or inguinal adenopathy  NEUROLOGICAL: Unremarkable. PSYCH:  Appropriate.    No visits with results within 3 Day(s) from this visit.  Latest known visit with results is:  Hospital Outpatient Visit on 05/28/2018  Component Date Value Ref Range Status  . H Pylori IgG 05/28/2018 <0.80  0.00 - 0.79 Index Value Final   Comment: (NOTE)                             Negative           <0.80                              Equivocal    0.80 - 0.89                             Positive           >0.89 Performed At: [facility] [address] [place], [place] [id number] Rush Farmer MD WU:[id number]   . AFP, Serum, Tumor Marker 05/28/2018 1.5  0.0 - 8.3 ng/mL Final   Comment: (NOTE) Roche [employer]ostics Electrochemiluminescence Immunoassay (ECLIA) Values obtained with different assay methods or kits cannot be used interchangeably.  Results cannot be interpreted as absolute evidence of the presence or absence of malignant disease. This test is not interpretable in pregnant females. Performed At: [facility] [place], [place] [id number] Rush Farmer MD VH:[id number]     Assessment:  Crystal [name] is a 67 y.o. female with a right lower lobe pulmonary nodule.  She denies any smoking history.  Abdomen and pelvic CT on 02/09/2018 revealed an 1.8 cm spiculated density in right lower lobe concerning for malignancy. There was mildly nodular hepatic contour concerning for hepatic cirrhosis.  There was moderate splenomegaly suggesting portal venous hypertension.  Mild ascites was noted.  There were mildly enlarged retroperitoneal lymph nodes (largest 1.1 cm) concerning for possible metastatic disease or malignancy.  There was a 7 cm left ovarian cyst.  Further evaluation with MRI was recommended to evaluate for possible neoplasm.  CA125 was 351.3 and CEA 1.1 on 02/21/2018.   Chest CT on 02/25/2018 revealed a 2.3 x 1.5 x 1.0 cm perifissural nodule with spiculated margins in the right middle lobe.  There was a 1.5 x 1.5 x 1.4 cm right lower lobe spiculated nodule.  There was a moderate left sided pleural effusion.    PET scan on 03/08/2018 revealed a 2.5 x 1.8 cm sub solid nodular opacity in the RML (SUV 2.7) and a 1.6 cm rounded slightly spiculated RLL pulmonary nodule (SUV 2.1).  There were no enlarged or hypermetabolic mediastinal or hilar lymph nodes.  There was a  small left pleural effusion.  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  Chest CT on 05/15/2018 revealed no significant changes in the 2 previously noted nodules in the RIGHT lung. Irregular RML lesion measures 2.4 x 1.4 cm (previously 2.3 x 1.5 cm). Spiculated central RLL lesion measures 1.8 x 1.4 cm (previously 1.6 x 1.5 cm). Both areas remain concerning for neoplasm. There  was interval decrease in ascites volume overall. Upper abdominal lymphadenopathy noted that was felt to be reactive due to underlying liver disease.   She was admitted to [facility] from 02/09/2018 - 02/12/2018 with a variceal hemorrhage.  EGD on 02/10/2018 revealed 5 columns of grade III esophageal varices in the lower third of the esophagus.  There was stigmata of recent bleeding and red wale signs.  She underwent variceal ligation x 10.   She required 3 units of PRBCs.  EGD on 03/25/2018 revealed 2 angiectasias (non-bleeding) in the second portion of the duodenum, and a few diminutive (non-bleeding) angiectasias in the prepyloric region of the stomach that were treated APC. There were 4 non-bleeding ulcers (Forrest Class III)  found in the gastric fundus. There were 4 columns of large non-varices in the lower third of the esophagus, of which demonstrated no stigmata of bleeding. Varices were banded. Colonoscopy on 03/25/2018 revealed a single 5 mm sessile polyp in the ascending colon. Patient has rectal varices and external hemorrhoids. Pathology returned as tubular adenoma, and was negative for high grade dysplasia and malignancy. EGD on 04/22/2018  revealed one non-bleeding cratered gastric ulcer (Forrest Class III) in the lesser curvature of the gastric body. Duodenal bulb and second portion of the duodenum were normal. Moderate portal hypertensive gastropathy noted in the stomach. Large varices in the lower third of the esophagus noted. 2 bands were  placed with incomplete eradication of the lesions.   She has iron deficiency anemia.  Ferritin was 6.  She is on ferrous sulfate 325 mg TID.  She has B12 deficiency.  B12 was 103.   Intrinsic factor antibody was negative.  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  She began B12 injections on 03/06/2018 (last 06/07/2018).  Diet is good.  She has hepatitis C antibodies.  HCV RNA was not detected. She has been exposed to hepatitis B.  Anti-smooth muscle antibodies are weakly positive.  ANA was positive (> 1:1280 centromere antibody).  Negative studies included: ceruloplasmin, anti-mitochondrial antibodies, hepatitis B IgM, HIV, and alpha-1 antitrypsin.  AFP was normal.  PT was 15.2 (INR 1.21).  Pelvic ultrasound  on 03/18/2018  demonstrated a simple cyst in the LEFT ovary measuring 6.1 x 4.5 x 6.6 cm. Repeat imaging recommended in 1 year to document stability.   Patient underwent cataract surgery on her LEFT eye on 04/30/2018.  Symptomatically, her shortness of breath has improved. Ascites has improved with ongoing diuresis.  She has stigmata of liver disease.  Plan: 1.  2.  3.  4. Review interval GI events. Since last visit, patient has undergone EGD x 2 and colonoscopy.  She has significant sequelae of cirrhosis with portal hypertension and esophageal varices.  In adition, she has angioectasias s/p APC. 5. Review pelvic ultrasound - simple cyst. Needs repeat imaging in 1 year to document stability.  6. Review CT imaging of the chest  No significant changes to the lesions in her RML and RLL.  Lesions remain concerning for neoplasms. Discuss previous recommendations of need for bronchoscopy for definitive diagnosis. Agrees to see Dr. Mortimer Fries. Referral placed.   Upper abdominal adenopathy noted that was felt to be related and related to underlying liver disease. 7. Continue monthly B12 injections (due 06/07/2018).  8. RTC following the bronchoscopy. Will need to  call for an appointment.    Lequita Asal, MD  06/13/2018, 7:04 AM   I saw and evaluated the patient, participating in the [place] portions of the service and  reviewing pertinent diagnostic studies and records.  I reviewed the nurse practitioner's note and agree with the findings and the plan.  The assessment and plan were discussed with the patient.  Multiple questions were asked by the patient and answered.   Nolon Stalls, MD 06/13/2018, 7:04 AM

## 2018-06-16 ENCOUNTER — Other Ambulatory Visit: Payer: Self-pay | Admitting: Gastroenterology

## 2018-06-22 ENCOUNTER — Other Ambulatory Visit: Payer: Self-pay | Admitting: Gastroenterology

## 2018-06-24 ENCOUNTER — Encounter: Payer: Self-pay | Admitting: *Deleted

## 2018-06-24 ENCOUNTER — Ambulatory Visit: Payer: Medicare HMO | Admitting: Anesthesiology

## 2018-06-24 ENCOUNTER — Encounter
Admission: RE | Disposition: A | Discharge status: Home / Self Care | Payer: Self-pay | Source: Ambulatory Visit | Attending: Gastroenterology

## 2018-06-24 ENCOUNTER — Ambulatory Visit
Admission: RE | Admit: 2018-06-24 | Discharge: 2018-06-24 | Disposition: A | Discharge status: Home / Self Care | Payer: Medicare HMO | Source: Ambulatory Visit | Attending: Gastroenterology | Admitting: Gastroenterology

## 2018-06-24 DIAGNOSIS — D649 Anemia, unspecified: Secondary | ICD-10-CM | POA: Diagnosis not present

## 2018-06-24 DIAGNOSIS — Z8711 Personal history of peptic ulcer disease: Secondary | ICD-10-CM | POA: Insufficient documentation

## 2018-06-24 DIAGNOSIS — K766 Portal hypertension: Secondary | ICD-10-CM | POA: Diagnosis not present

## 2018-06-24 DIAGNOSIS — I8500 Esophageal varices without bleeding: Secondary | ICD-10-CM | POA: Diagnosis not present

## 2018-06-24 DIAGNOSIS — E119 Type 2 diabetes mellitus without complications: Secondary | ICD-10-CM | POA: Insufficient documentation

## 2018-06-24 DIAGNOSIS — Z7984 Long term (current) use of oral hypoglycemic drugs: Secondary | ICD-10-CM | POA: Diagnosis not present

## 2018-06-24 DIAGNOSIS — K279 Peptic ulcer, site unspecified, unspecified as acute or chronic, without hemorrhage or perforation: Secondary | ICD-10-CM | POA: Diagnosis not present

## 2018-06-24 DIAGNOSIS — I8510 Secondary esophageal varices without bleeding: Secondary | ICD-10-CM | POA: Diagnosis not present

## 2018-06-24 DIAGNOSIS — K7460 Unspecified cirrhosis of liver: Secondary | ICD-10-CM | POA: Diagnosis not present

## 2018-06-24 DIAGNOSIS — Z09 Encounter for follow-up examination after completed treatment for conditions other than malignant neoplasm: Secondary | ICD-10-CM | POA: Diagnosis present

## 2018-06-24 DIAGNOSIS — Z79899 Other long term (current) drug therapy: Secondary | ICD-10-CM | POA: Insufficient documentation

## 2018-06-24 DIAGNOSIS — K219 Gastro-esophageal reflux disease without esophagitis: Secondary | ICD-10-CM | POA: Insufficient documentation

## 2018-06-24 DIAGNOSIS — K3189 Other diseases of stomach and duodenum: Secondary | ICD-10-CM | POA: Insufficient documentation

## 2018-06-24 HISTORY — PX: ESOPHAGOGASTRODUODENOSCOPY (EGD) WITH PROPOFOL: SHX5813

## 2018-06-24 LAB — GLUCOSE, CAPILLARY
Glucose-Capillary: 157 mg/dL — ABNORMAL HIGH (ref 70–99)
Glucose-Capillary: 44 mg/dL — CL (ref 70–99)
Glucose-Capillary: 168 mg/dL — ABNORMAL HIGH (ref 70–99)

## 2018-06-24 SURGERY — ESOPHAGOGASTRODUODENOSCOPY (EGD) WITH PROPOFOL
Anesthesia: General

## 2018-06-24 MED ORDER — ONDANSETRON HCL 4 MG/2ML IJ SOLN: 4.0000 mg | Freq: Once | INTRAMUSCULAR | Status: DC | PRN

## 2018-06-24 MED ORDER — FENTANYL CITRATE (PF) 100 MCG/2ML IJ SOLN: 25.0000 ug | INTRAMUSCULAR | Status: DC | PRN

## 2018-06-24 MED ORDER — LIDOCAINE HCL (PF) 2 % IJ SOLN
INTRAMUSCULAR | Status: AC
  Filled 2018-06-24: qty 10

## 2018-06-24 MED ORDER — DEXTROSE 50 % IV SOLN
INTRAVENOUS | Status: AC
  Filled 2018-06-24: qty 50

## 2018-06-24 MED ORDER — PROPOFOL 500 MG/50ML IV EMUL
INTRAVENOUS | Status: DC | PRN
  Administered 2018-06-24: 110 ug/kg/min via INTRAVENOUS

## 2018-06-24 MED ORDER — PROPOFOL 10 MG/ML IV BOLUS
INTRAVENOUS | Status: DC | PRN
  Administered 2018-06-24: 100 mg via INTRAVENOUS

## 2018-06-24 MED ORDER — SODIUM CHLORIDE 0.9 % IV SOLN: INTRAVENOUS | Status: DC

## 2018-06-24 MED ORDER — DEXTROSE IN LACTATED RINGERS 5 % IV SOLN
INTRAVENOUS | Status: DC
  Administered 2018-06-24: 08:00:00 via INTRAVENOUS

## 2018-06-24 MED ORDER — DEXTROSE 50 % IV SOLN
12.5000 g | Freq: Once | INTRAVENOUS | Status: AC
  Administered 2018-06-24: 12.5 g via INTRAVENOUS

## 2018-06-24 NOTE — Transfer of Care (Signed)
Immediate Anesthesia Transfer of Care Note  Patient: Crystal [name]  Procedure(s) Performed: ESOPHAGOGASTRODUODENOSCOPY (EGD) WITH PROPOFOL (N/A )  Patient Location: Endoscopy Unit  Anesthesia Type:General  Level of Consciousness: drowsy and patient cooperative  Airway & Oxygen Therapy: Patient Spontanous Breathing and Patient connected to nasal cannula oxygen  Post-op Assessment: Report given to RN and Post -op Vital signs reviewed and stable  Post vital signs: Reviewed and stable  Last Vitals:  Vitals Value Taken Time  BP 94/69 06/24/2018  9:17 AM  Temp    Pulse 69 06/24/2018  9:18 AM  Resp 22 06/24/2018  9:18 AM  SpO2 94 % 06/24/2018  9:18 AM  Vitals shown include unvalidated device data.  Last Pain:  Vitals:   06/24/18 0756  TempSrc: Tympanic         Complications: No apparent anesthesia complications

## 2018-06-24 NOTE — Anesthesia Post-op Follow-up Note (Signed)
Anesthesia QCDR form completed.

## 2018-06-24 NOTE — Anesthesia Preprocedure Evaluation (Signed)
Anesthesia Evaluation  Patient identified by MRN, date of birth, ID band Patient awake    Reviewed: Allergy & Precautions, H&P , NPO status , Patient's Chart, lab work & pertinent test results  History of Anesthesia Complications Negative for: history of anesthetic complications  Airway Mallampati: III  TM Distance: <3 FB Neck ROM: limited    Dental  (+) Edentulous Lower, Edentulous Upper   Pulmonary neg pulmonary ROS, neg shortness of breath,           Cardiovascular Exercise Tolerance: Good (-) angina(-) Past MI and (-) DOE negative cardio ROS       Neuro/Psych negative neurological ROS  negative psych ROS   GI/Hepatic PUD, GERD  Medicated and Controlled,(+) Hepatitis -  Endo/Other  diabetes, Type 2  Renal/GU negative Renal ROS  negative genitourinary   Musculoskeletal  (+) Arthritis ,   Abdominal   Peds  Hematology negative hematology ROS (+)   Anesthesia Other Findings Past Medical History: No date: Anemia     Comment:  TAKES IRON TAB No date: Arthritis     Comment:  SHOULDER No date: Bleeding     Comment:  GI  3/19 No date: Bronchitis     Comment:  HX OF No date: Cirrhosis ([facility]) No date: Diabetes mellitus without complication ([facility])     Comment:  TYPE 2 No date: Fatty (change of) liver, not elsewhere classified No date: Full dentures     Comment:  UPPER AND LOWER No date: Lung nodule, multiple  Past Surgical History: 02/06/2018: CATARACT EXTRACTION W/PHACO; Right     Comment:  Procedure: CATARACT EXTRACTION PHACO AND INTRAOCULAR               LENS PLACEMENT ([place]) RIGHT DIABETIC;  Surgeon:               Leandrew Koyanagi, MD;  Location: [place];              Service: Ophthalmology;  Laterality: Right;  DIABETIC,               ORAL MED 04/30/2018: CATARACT EXTRACTION W/PHACO; Left     Comment:  Procedure: CATARACT EXTRACTION PHACO AND INTRAOCULAR               LENS PLACEMENT ([facility]);   Surgeon: Leandrew Koyanagi, MD;              Location: [facility] ORS;  Service: Ophthalmology;  Laterality:              Left;  [place] 01:04 AP% 21.3 CDE 13.66 Fluid pack lot #               [id number] 03/25/2018: COLONOSCOPY WITH PROPOFOL; N/A     Comment:  Procedure: COLONOSCOPY WITH PROPOFOL;  Surgeon: Lin Landsman, MD;  Location: [facility] ENDOSCOPY;  Service:               Gastroenterology;  Laterality: N/A; No date: DENTAL SURGERY     Comment:  EXTRACTIONS 02/10/2018: ESOPHAGOGASTRODUODENOSCOPY (EGD) WITH PROPOFOL; N/A     Comment:  Procedure: ESOPHAGOGASTRODUODENOSCOPY (EGD) WITH               PROPOFOL;  Surgeon: Jonathon Bellows, MD;  Location: [facility]               ENDOSCOPY;  Service: Gastroenterology;  Laterality: N/A; 03/25/2018: ESOPHAGOGASTRODUODENOSCOPY (EGD) WITH PROPOFOL; N/A     Comment:  Procedure: ESOPHAGOGASTRODUODENOSCOPY (EGD) WITH               PROPOFOL;  Surgeon: Lin Landsman, MD;  Location:               [facility] ENDOSCOPY;  Service: Gastroenterology;  Laterality:               N/A; 04/22/2018: ESOPHAGOGASTRODUODENOSCOPY (EGD) WITH PROPOFOL; N/A     Comment:  Procedure: ESOPHAGOGASTRODUODENOSCOPY (EGD) WITH               PROPOFOL with band ligation;  Surgeon: Lin Landsman, MD;  Location: [place];  Service:               Gastroenterology;  Laterality: N/A; No date: EYE SURGERY 03/25/2018: HEMORRHOID BANDING     Comment:  Procedure: HEMORRHOID BANDING;  Surgeon: Lin Landsman, MD;  Location: [facility] ENDOSCOPY;  Service:               Gastroenterology;;  BMI    Body Mass Index:  30.11 kg/m      Reproductive/Obstetrics negative OB ROS                             Anesthesia Physical Anesthesia Plan  ASA: III  Anesthesia Plan: General   Post-op Pain Management:    Induction: Intravenous  PONV Risk Score and Plan: Propofol infusion and TIVA  Airway Management Planned: Natural  Airway and Nasal Cannula  Additional Equipment:   Intra-op Plan:   Post-operative Plan:   Informed Consent: I have reviewed the patients History and Physical, chart, labs and discussed the procedure including the risks, benefits and alternatives for the proposed anesthesia with the patient or authorized representative who has indicated his/her understanding and acceptance.   Dental Advisory Given  Plan Discussed with: Anesthesiologist, CRNA and Surgeon  Anesthesia Plan Comments: (Patient consented for risks of anesthesia including but not limited to:  - adverse reactions to medications - risk of intubation if required - damage to teeth, lips or other oral mucosa - sore throat or hoarseness - Damage to heart, brain, lungs or loss of life  Patient voiced understanding.)        Anesthesia Quick Evaluation

## 2018-06-24 NOTE — Anesthesia Postprocedure Evaluation (Signed)
Anesthesia Post Note  Patient: Crystal [name]  Procedure(s) Performed: ESOPHAGOGASTRODUODENOSCOPY (EGD) WITH PROPOFOL (N/A )  Patient location during evaluation: Endoscopy Anesthesia Type: General Level of consciousness: awake and alert Pain management: pain level controlled Vital Signs Assessment: post-procedure vital signs reviewed and stable Respiratory status: spontaneous breathing, nonlabored ventilation, respiratory function stable and patient connected to nasal cannula oxygen Cardiovascular status: blood pressure returned to baseline and stable Postop Assessment: no apparent nausea or vomiting Anesthetic complications: no     Last Vitals:  Vitals:   06/24/18 0937 06/24/18 0947  BP: 111/65 108/61  Pulse: 65 63  Resp: 19 18  Temp:    SpO2: 96% 96%    Last Pain:  Vitals:   06/24/18 0947  TempSrc:   PainSc: 0-No pain                 Precious Haws Lynda Wanninger

## 2018-06-24 NOTE — Op Note (Signed)
[facility] Gastroenterology Patient Name: Crystal [name] Procedure Date: 06/24/2018 8:49 AM MRN: [id number] Account #: [id number] Date of Birth: [birth year] Admit Type: Outpatient Age: 67 Room: [facility] ENDO ROOM 2 Gender: Female Note Status: Finalized Procedure:            Upper GI endoscopy Indications:          Follow-up of peptic ulcer, Follow-up of esophageal                        varices Providers:            Lin Landsman MD, MD Referring MD:         Lorelee Market (Referring MD) Medicines:            Monitored Anesthesia Care Complications:        No immediate complications. Estimated blood loss: None. Procedure:            Pre-Anesthesia Assessment:                       - Prior to the procedure, a History and Physical was                        performed, and patient medications and allergies were                        reviewed. The patient is competent. The risks and                        benefits of the procedure and the sedation options and                        risks were discussed with the patient. All questions                        were answered and informed consent was obtained.                        Patient identification and proposed procedure were                        verified by the physician, the nurse, the                        anesthesiologist, the anesthetist and the technician in                        the pre-procedure area in the procedure room in the                        endoscopy suite. Mental Status Examination: alert and                        oriented. Airway Examination: normal oropharyngeal                        airway and neck mobility. Respiratory Examination:                        clear to auscultation. CV Examination: normal.  Prophylactic Antibiotics: The patient does not require                        prophylactic antibiotics. Prior Anticoagulants: The                        patient has  taken no previous anticoagulant or                        antiplatelet agents. ASA Grade Assessment: III - A                        patient with severe systemic disease. After reviewing                        the risks and benefits, the patient was deemed in                        satisfactory condition to undergo the procedure. The                        anesthesia plan was to use monitored anesthesia care                        (MAC). Immediately prior to administration of                        medications, the patient was re-assessed for adequacy                        to receive sedatives. The heart rate, respiratory rate,                        oxygen saturations, blood pressure, adequacy of                        pulmonary ventilation, and response to care were                        monitored throughout the procedure. The physical status                        of the patient was re-assessed after the procedure.                       After obtaining informed consent, the endoscope was                        passed under direct vision. Throughout the procedure,                        the patient's blood pressure, pulse, and oxygen                        saturations were monitored continuously. The Endoscope                        was introduced through the mouth, and advanced to the  second part of duodenum. The upper GI endoscopy was                        accomplished without difficulty. The patient tolerated                        the procedure well. Findings:      Three columns of non-bleeding large (> 5 mm) varices were found in the       middle third of the esophagus and in the lower third of the esophagus,.       No stigmata of recent bleeding were evident and red wale signs were       present. Extensive scarring from prior treatment was visible. Therefore,       ligation was not attempted. Evidence of partial eradication was visible.       The varices  appeared smaller than they were at prior exam.      Mild portal hypertensive gastropathy was found in the entire examined       stomach. Previously seen ulcers are now healed      The duodenal bulb and second portion of the duodenum were normal. Impression:           - Non-bleeding large (> 5 mm) esophageal varices.                       - Portal hypertensive gastropathy.                       - Normal duodenal bulb and second portion of the                        duodenum.                       - No specimens collected. Recommendation:       - Discharge patient to home (with escort).                       - Low sodium diet.                       - Continue present medications.                       - Repeat upper endoscopy in 4 months for surveillance.                       - Continue propranolol at current dose, heart rate at                        goal                       - Return to my office as previously scheduled. Procedure Code(s):    --- Professional ---                       [phone], Esophagogastroduodenoscopy, flexible, transoral;                        diagnostic, including collection of specimen(s) by  brushing or washing, when performed (separate procedure) Diagnosis Code(s):    --- Professional ---                       I85.00, Esophageal varices without bleeding                       K76.6, Portal hypertension                       K31.89, Other diseases of stomach and duodenum                       K27.9, Peptic ulcer, site unspecified, unspecified as                        acute or chronic, without hemorrhage or perforation CPT copyright 2017 American [employer]. All rights reserved. The codes documented in this report are preliminary and upon coder review may  be revised to meet current compliance requirements. Dr. Ulyess Mort Lin Landsman MD, MD 06/24/2018 9:16:31 AM This report has been signed electronically. Number of  Addenda: 0 Note Initiated On: 06/24/2018 8:49 AM      [facility]

## 2018-06-24 NOTE — H&P (Signed)
Crystal Darby, MD [address]  [place]  [place], [place] [address]  Main: [phone]  Fax: [phone] Pager: [phone]  Primary Care Physician:  Crystal Market, MD Primary Gastroenterologist:  Dr. Cephas [name]  Pre-Procedure History & Physical: HPI:  Crystal [name] is a 67 y.o. female is here for an endoscopy.   Past Medical History:  Diagnosis Date  . Anemia    TAKES IRON TAB  . Arthritis    SHOULDER  . Bleeding    GI  3/19  . Bronchitis    HX OF  . Cirrhosis ([place])   . Diabetes mellitus without complication ([place])    TYPE 2  . Fatty (change of) liver, not elsewhere classified   . Full dentures    UPPER AND LOWER  . Lung nodule, multiple     Past Surgical History:  Procedure Laterality Date  . CATARACT EXTRACTION W/PHACO Right 02/06/2018   Procedure: CATARACT EXTRACTION PHACO AND INTRAOCULAR LENS PLACEMENT (Crystal [name]) RIGHT DIABETIC;  Surgeon: Crystal Koyanagi, MD;  Location: [facility];  Service: Ophthalmology;  Laterality: Right;  DIABETIC, ORAL MED  . CATARACT EXTRACTION W/PHACO Left 04/30/2018   Procedure: CATARACT EXTRACTION PHACO AND INTRAOCULAR LENS PLACEMENT (I[facility]);  Surgeon: Crystal Koyanagi, MD;  Location: [facility] ORS;  Service: Ophthalmology;  Laterality: Left;  [place] 01:04 AP% 21.3 CDE 13.66 Fluid pack lot # [id number]  . COLONOSCOPY WITH PROPOFOL N/A 03/25/2018   Procedure: COLONOSCOPY WITH PROPOFOL;  Surgeon: Crystal Landsman, MD;  Location: [facility] ENDOSCOPY;  Service: Gastroenterology;  Laterality: N/A;  . DENTAL SURGERY     EXTRACTIONS  . ESOPHAGOGASTRODUODENOSCOPY (EGD) WITH PROPOFOL N/A 02/10/2018   Procedure: ESOPHAGOGASTRODUODENOSCOPY (EGD) WITH PROPOFOL;  Surgeon: Crystal Bellows, MD;  Location: [facility] ENDOSCOPY;  Service: Gastroenterology;  Laterality: N/A;  . ESOPHAGOGASTRODUODENOSCOPY (EGD) WITH PROPOFOL N/A 03/25/2018   Procedure: ESOPHAGOGASTRODUODENOSCOPY (EGD) WITH PROPOFOL;  Surgeon: Crystal Landsman, MD;  Location:  [facility] ENDOSCOPY;  Service: Gastroenterology;  Laterality: N/A;  . ESOPHAGOGASTRODUODENOSCOPY (EGD) WITH PROPOFOL N/A 04/22/2018   Procedure: ESOPHAGOGASTRODUODENOSCOPY (EGD) WITH PROPOFOL with band ligation;  Surgeon: Crystal Landsman, MD;  Location: [facility];  Service: Gastroenterology;  Laterality: N/A;  . EYE SURGERY    . HEMORRHOID BANDING  03/25/2018   Procedure: HEMORRHOID BANDING;  Surgeon: Crystal Landsman, MD;  Location: [facility] ENDOSCOPY;  Service: Gastroenterology;;    Prior to Admission medications   Medication Sig Start Date End Date Taking? Authorizing Provider  metFORMIN (GLUCOPHAGE) 500 MG tablet Take 1,000 mg by mouth 2 (two) times daily as needed (for BS < 100).    Yes [provider]  omeprazole (PRILOSEC) 40 MG capsule Take 1 capsule (40 mg total) by mouth 2 (two) times daily before a meal. 05/24/18 11/20/18 Yes [name], Tally Due, MD  polyethylene glycol powder (GLYCOLAX/MIRALAX) powder Take 17 g by mouth 2 (two) times daily. 02/14/18  Yes [name], Tally Due, MD  ferrous sulfate 325 (65 FE) MG tablet Take 1 tablet (325 mg total) by mouth daily. 02/13/18   Crystal Costa, MD  furosemide (LASIX) 20 MG tablet Take 2 tablets (40 mg total) by mouth daily. 05/24/18 11/20/18  Crystal Landsman, MD  nystatin (MYCOSTATIN/NYSTOP) powder  05/23/18   [provider]  propranolol (INDERAL) 20 MG tablet Take 1 tablet (20 mg total) by mouth 2 (two) times daily. 02/14/18 08/13/18  Crystal Landsman, MD  spironolactone (ALDACTONE) 50 MG tablet Take 2 tablets (100 mg total) by mouth daily. 05/24/18 11/20/18  Crystal Landsman, MD  Vitamin D, Ergocalciferol, (DRISDOL) 50000 units CAPS capsule TAKE ONE CAPSULE BY MOUTH ONE TIME PER WEEK 05/27/18   Crystal Landsman, MD    Allergies as of 05/02/2018  . (No Known Allergies)    Family History  Problem Relation Age of Onset  . CAD Father   . Cancer Maternal Uncle     Social History   Socioeconomic History  . Marital  status: Single    Spouse name: Not on file  . Number of children: Not on file  . Years of education: Not on file  . Highest education level: Not on file  Occupational History  . Not on file  Social Needs  . Financial resource strain: Not on file  . Food insecurity:    Worry: Not on file    Inability: Not on file  . Transportation needs:    Medical: Not on file    Non-medical: Not on file  Tobacco Use  . Smoking status: Never Smoker  . Smokeless tobacco: Never Used  Substance and Sexual Activity  . Alcohol use: No    Frequency: Never  . Drug use: Never  . Sexual activity: Not on file  Lifestyle  . Physical activity:    Days per week: Not on file    Minutes per session: Not on file  . Stress: Not on file  Relationships  . Social connections:    Talks on phone: Not on file    Gets together: Not on file    Attends religious service: Not on file    Active member of club or organization: Not on file    Attends meetings of clubs or organizations: Not on file    Relationship status: Not on file  . Intimate partner violence:    Fear of current or ex partner: Not on file    Emotionally abused: Not on file    Physically abused: Not on file    Forced sexual activity: Not on file  Other Topics Concern  . Not on file  Social History Narrative   Independent at baseline. Lives at home with family    Review of Systems: See HPI, otherwise negative ROS  Physical Exam: BP (!) 153/69   Pulse 63   Temp (!) 95.5 F (35.3 C) (Tympanic)   Resp 18   Ht 5' 3" (1.6 m)   Wt 77.1 kg   BMI 30.11 kg/m  General:   Alert,  pleasant and cooperative in NAD Head:  Normocephalic and atraumatic. Neck:  Supple; no masses or thyromegaly. Lungs:  Clear throughout to auscultation.    Heart:  Regular rate and rhythm. Abdomen:  Soft, nontender and nondistended. Normal bowel sounds, without guarding, and without rebound.   Neurologic:  Alert and  oriented x4;  grossly normal  neurologically.  Impression/Plan: Crystal [name] is here for an endoscopy to be performed for variceal surveillance  Risks, benefits, limitations, and alternatives regarding  endoscopy have been reviewed with the patient.  Questions have been answered.  All parties agreeable.   Crystal Sear, MD  06/24/2018, 8:23 AM

## 2018-06-25 ENCOUNTER — Encounter: Payer: Self-pay | Admitting: Gastroenterology

## 2018-06-26 ENCOUNTER — Other Ambulatory Visit: Payer: Self-pay | Admitting: Gastroenterology

## 2018-07-03 ENCOUNTER — Inpatient Hospital Stay: Payer: Medicare HMO | Attending: Hematology and Oncology

## 2018-07-03 DIAGNOSIS — E538 Deficiency of other specified B group vitamins: Secondary | ICD-10-CM | POA: Diagnosis not present

## 2018-07-03 DIAGNOSIS — D510 Vitamin B12 deficiency anemia due to intrinsic factor deficiency: Secondary | ICD-10-CM | POA: Diagnosis present

## 2018-07-03 MED ORDER — CYANOCOBALAMIN 1000 MCG/ML IJ SOLN
1000.0000 ug | Freq: Once | INTRAMUSCULAR | Status: AC
  Administered 2018-07-03: 1000 ug via INTRAMUSCULAR
  Filled 2018-07-03: qty 1

## 2018-07-06 ENCOUNTER — Other Ambulatory Visit: Payer: Self-pay | Admitting: Gastroenterology

## 2018-07-08 ENCOUNTER — Other Ambulatory Visit: Payer: Self-pay

## 2018-07-08 DIAGNOSIS — I8500 Esophageal varices without bleeding: Secondary | ICD-10-CM

## 2018-07-10 ENCOUNTER — Inpatient Hospital Stay: Payer: Medicare HMO

## 2018-07-10 ENCOUNTER — Other Ambulatory Visit: Payer: Self-pay | Admitting: Hematology and Oncology

## 2018-07-10 ENCOUNTER — Inpatient Hospital Stay (HOSPITAL_BASED_OUTPATIENT_CLINIC_OR_DEPARTMENT_OTHER): Payer: Medicare HMO | Admitting: Hematology and Oncology

## 2018-07-10 VITALS — BP 129/73 | HR 57 | Temp 96.7°F | Resp 18 | Wt 170.0 lb

## 2018-07-10 DIAGNOSIS — E538 Deficiency of other specified B group vitamins: Secondary | ICD-10-CM | POA: Diagnosis not present

## 2018-07-10 DIAGNOSIS — D500 Iron deficiency anemia secondary to blood loss (chronic): Secondary | ICD-10-CM

## 2018-07-10 DIAGNOSIS — D509 Iron deficiency anemia, unspecified: Secondary | ICD-10-CM

## 2018-07-10 DIAGNOSIS — R918 Other nonspecific abnormal finding of lung field: Secondary | ICD-10-CM | POA: Diagnosis not present

## 2018-07-10 DIAGNOSIS — D510 Vitamin B12 deficiency anemia due to intrinsic factor deficiency: Secondary | ICD-10-CM | POA: Diagnosis not present

## 2018-07-10 LAB — CBC WITH DIFFERENTIAL/PLATELET
Basophils Absolute: 0 10*3/uL (ref 0–0.1)
Basophils Relative: 1 %
Eosinophils Absolute: 0.1 10*3/uL (ref 0–0.7)
Eosinophils Relative: 3 %
HCT: 31 % — ABNORMAL LOW (ref 35.0–47.0)
Hemoglobin: 10.1 g/dL — ABNORMAL LOW (ref 12.0–16.0)
Lymphocytes Relative: 30 %
Lymphs Abs: 1.1 10*3/uL (ref 1.0–3.6)
MCH: 25.6 pg — ABNORMAL LOW (ref 26.0–34.0)
MCHC: 32.4 g/dL (ref 32.0–36.0)
MCV: 78.9 fL — ABNORMAL LOW (ref 80.0–100.0)
Monocytes Absolute: 0.4 10*3/uL (ref 0.2–0.9)
Monocytes Relative: 10 %
Neutro Abs: 2.1 10*3/uL (ref 1.4–6.5)
Neutrophils Relative %: 56 %
Platelets: 188 10*3/uL (ref 150–440)
RBC: 3.93 MIL/uL (ref 3.80–5.20)
RDW: 17 % — ABNORMAL HIGH (ref 11.5–14.5)
WBC: 3.7 10*3/uL (ref 3.6–11.0)

## 2018-07-10 LAB — FERRITIN: Ferritin: 13 ng/mL (ref 11–307)

## 2018-07-10 LAB — IRON AND TIBC
Iron: 111 ug/dL (ref 28–170)
Saturation Ratios: 27 % (ref 10.4–31.8)
TIBC: 409 ug/dL (ref 250–450)
UIBC: 298 ug/dL

## 2018-07-10 NOTE — Progress Notes (Signed)
[facility] day:  07/10/2018  Chief Complaint: Crystal [name] is a 67 y.o. female with 2 right sided pulmonary nodules and lymphadenopathy who seen for review of interval chest CT and pelvic ultrasound and 2 month reassessment.  HPI:  The patient was last seen in the medical oncology clinic on 05/20/2018.  At that time, her shortness of breath had improved. Ascites had improved with ongoing diuresis.  She had stigmata of liver disease.  EGD had revealed esophageal varices and portal hypertensive gastropathy.  She has angioectasias s/p APC.  She was to be seen by pulmonary medicine regarding her lung lesions.  She saw Dr. Marius Ditch on 05/24/2018. Labs revealed a hematocrit of 31.8, hemoglobin 10.4, MCV 79.5, platelets 167,000, and WBC 3100.  Ferritin was 16 with a saturation of 14% and a TIBC of 375.  B12 was 485.  Creatinine was 0.68.  Alkaline phosphatase was 252.  AST was 68 and ALT 50.  Bilirubin was 0.7.  She has a follow-up in 3 months.    AFP was 1.5 on 05/28/2018.  RUQ ultrasound on 05/29/2018 revealed cholelithiasis. There was a thickened edematous gallbladder wall. The wall thickening could be due to hypoproteinemia/hypoalbuminemia or cholecystitis. However, the patient had a negative sonographic Murphy's sign.  There was a heterogeneous slightly nodular liver consistent with cirrhosis.  She received B12 on 06/07/2018 and 07/03/2018.  During the interim, she has been taking oral iron initially 2x/day then 1 x/day over the past 3 weeks.  She states that pulmonary changed her medications.  She "isn't doing anything before Christmas".   Past Medical History:  Diagnosis Date  . Anemia    TAKES IRON TAB  . Arthritis    SHOULDER  . Bleeding    GI  3/19  . Bronchitis    HX OF  . Cirrhosis ([place])   . Diabetes mellitus without complication ([place])    TYPE 2  . Fatty (change of) liver, not elsewhere classified   . Full dentures    UPPER AND  LOWER  . Lung nodule, multiple     Past Surgical History:  Procedure Laterality Date  . CATARACT EXTRACTION W/PHACO Right 02/06/2018   Procedure: CATARACT EXTRACTION PHACO AND INTRAOCULAR LENS PLACEMENT ([place]) RIGHT DIABETIC;  Surgeon: Leandrew Koyanagi, MD;  Location: [place];  Service: Ophthalmology;  Laterality: Right;  DIABETIC, ORAL MED  . CATARACT EXTRACTION W/PHACO Left 04/30/2018   Procedure: CATARACT EXTRACTION PHACO AND INTRAOCULAR LENS PLACEMENT ([facility]);  Surgeon: Leandrew Koyanagi, MD;  Location: [facility];  Service: Ophthalmology;  Laterality: Left;  [place] 01:04 AP% 21.3 CDE 13.66 Fluid pack lot # [id number]  . COLONOSCOPY WITH PROPOFOL N/A 03/25/2018   Procedure: COLONOSCOPY WITH PROPOFOL;  Surgeon: Lin Landsman, MD;  Location: [facility] ENDOSCOPY;  Service: Gastroenterology;  Laterality: N/A;  . DENTAL SURGERY     EXTRACTIONS  . ESOPHAGOGASTRODUODENOSCOPY (EGD) WITH PROPOFOL N/A 02/10/2018   Procedure: ESOPHAGOGASTRODUODENOSCOPY (EGD) WITH PROPOFOL;  Surgeon: Jonathon Bellows, MD;  Location: [place] ENDOSCOPY;  Service: Gastroenterology;  Laterality: N/A;  . ESOPHAGOGASTRODUODENOSCOPY (EGD) WITH PROPOFOL N/A 03/25/2018   Procedure: ESOPHAGOGASTRODUODENOSCOPY (EGD) WITH PROPOFOL;  Surgeon: Lin Landsman, MD;  Location: [facility] ENDOSCOPY;  Service: Gastroenterology;  Laterality: N/A;  . ESOPHAGOGASTRODUODENOSCOPY (EGD) WITH PROPOFOL N/A 04/22/2018   Procedure: ESOPHAGOGASTRODUODENOSCOPY (EGD) WITH PROPOFOL with band ligation;  Surgeon: Lin Landsman, MD;  Location: [place];  Service: Gastroenterology;  Laterality: N/A;  . ESOPHAGOGASTRODUODENOSCOPY (EGD) WITH PROPOFOL N/A 06/24/2018   Procedure:  ESOPHAGOGASTRODUODENOSCOPY (EGD) WITH PROPOFOL;  Surgeon: Lin Landsman, MD;  Location: [facility] ENDOSCOPY;  Service: Gastroenterology;  Laterality: N/A;  . EYE SURGERY    . HEMORRHOID BANDING  03/25/2018   Procedure: HEMORRHOID BANDING;  Surgeon: Lin Landsman, MD;   Location: [facility];  Service: Gastroenterology;;    Family History  Problem Relation Age of Onset  . CAD Father   . Cancer Maternal Uncle     Social History:  reports that she has never smoked. She has never used smokeless tobacco. She reports that she does not drink alcohol or use drugs.  Patient denies known exposures to radiation on toxins. She retired from [employer] in 11/2017.  The patient is alone today.  Allergies: No Known Allergies  Current Medications: Current Outpatient Medications  Medication Sig Dispense Refill  . ferrous sulfate 325 (65 FE) MG tablet Take 1 tablet (325 mg total) by mouth daily. 30 tablet 3  . furosemide (LASIX) 20 MG tablet Take 2 tablets (40 mg total) by mouth daily. 180 tablet 1  . metFORMIN (GLUCOPHAGE) 500 MG tablet Take 1,000 mg by mouth 2 (two) times daily as needed (for BS < 100).     . nystatin (MYCOSTATIN/NYSTOP) powder     . omeprazole (PRILOSEC) 40 MG capsule Take 1 capsule (40 mg total) by mouth 2 (two) times daily before a meal. 180 capsule 1  . polyethylene glycol powder (GLYCOLAX/MIRALAX) powder Take 17 g by mouth 2 (two) times daily. 255 g 2  . propranolol (INDERAL) 20 MG tablet Take 1 tablet (20 mg total) by mouth 2 (two) times daily. 180 tablet 1  . spironolactone (ALDACTONE) 50 MG tablet Take 2 tablets (100 mg total) by mouth daily. 180 tablet 1  . Vitamin D, Ergocalciferol, (DRISDOL) 50000 units CAPS capsule TAKE ONE CAPSULE BY MOUTH ONE TIME PER WEEK 4 capsule 0   No current facility-administered medications for this visit.     Review of Systems  Constitutional: Positive for malaise/fatigue. Negative for chills, diaphoresis, fever and weight loss (up 2 pounds).       Feels "the same".  HENT: Negative.  Negative for congestion, ear discharge, ear pain, nosebleeds, sinus pain and tinnitus.   Eyes: Negative for double vision, photophobia, pain, discharge and redness.       S/p left sided cataract surgery.  Respiratory:  Negative.  Negative for cough, hemoptysis, sputum production and shortness of breath.   Cardiovascular: Positive for leg swelling. Negative for chest pain, palpitations, orthopnea and PND.  Gastrointestinal: Positive for abdominal pain (cirrhosis). Negative for blood in stool, constipation (takes Miralax), diarrhea, heartburn, melena, nausea and vomiting.       Portal hypertensive gastropathy s/p variceal ligation. On daily PPI.   Genitourinary: Negative.  Negative for dysuria, frequency, hematuria and urgency.  Musculoskeletal: Negative.  Negative for back pain, falls, joint pain, myalgias and neck pain.  Skin: Negative.  Negative for itching and rash.  Neurological: Negative for dizziness, tingling, tremors, sensory change, speech change, focal weakness, weakness and headaches.  Endo/Heme/Allergies: Does not bruise/bleed easily.       Diabetes.  Psychiatric/Behavioral: Negative for depression and memory loss. The patient is not nervous/anxious and does not have insomnia.   All other systems reviewed and are negative.  Performance status (ECOG):  1   Physical Exam: Blood pressure 129/73, pulse (!) 57, temperature (!) 96.7 F (35.9 C), temperature source Tympanic, resp. rate 18, weight 169 lb 15.6 oz (77.1 kg). GENERAL:  Well developed, well nourished, woman  sitting comfortably in the exam room in no acute distress. MENTAL STATUS:  Alert and oriented to person, place and time. HEAD:  Short gray hair.  Normocephalic, atraumatic, face symmetric, no Cushingoid features. EYES:  Glasses.  Blue eyes.  Pupils equal round and reactive to light and accomodation.  No conjunctivitis or scleral icterus. ENT:  Oropharynx clear without lesion.  Tongue normal. Mucous membranes moist.  RESPIRATORY:  Clear to auscultation without rales, wheezes or rhonchi. CARDIOVASCULAR:  Regular rate and rhythm without murmur, rub or gallop. ABDOMEN:  Soft, non-tender, with active bowel sounds, and no hepatosplenomegaly.   No masses. SKIN:  Stigmata of liver disease.  No rashes, ulcers or lesions. EXTREMITIES: No edema, no skin discoloration or tenderness.  No palpable cords. LYMPH NODES: No palpable cervical, supraclavicular, axillary or inguinal adenopathy  NEUROLOGICAL: Unremarkable. PSYCH:  Appropriate.    No visits with results within 3 Day(s) from this visit.  Latest known visit with results is:  Admission on 06/24/2018, Discharged on 06/24/2018  Component Date Value Ref Range Status  . Glucose-Capillary 06/24/2018 44* 70 - 99 mg/dL Final  . Comment 1 06/24/2018 Call MD NNP PA CNM   Final  . Glucose-Capillary 06/24/2018 157* 70 - 99 mg/dL Final  . Glucose-Capillary 06/24/2018 168* 70 - 99 mg/dL Final    Assessment:  Alexandrina Fiorini Hilburn is a 67 y.o. female with a right lower lobe pulmonary nodule.  She denies any smoking history.  Abdomen and pelvic CT on 02/09/2018 revealed an 1.8 cm spiculated density in right lower lobe concerning for malignancy. There was mildly nodular hepatic contour concerning for hepatic cirrhosis.  There was moderate splenomegaly suggesting portal venous hypertension.  Mild ascites was noted.  There were mildly enlarged retroperitoneal lymph nodes (largest 1.1 cm) concerning for possible metastatic disease or malignancy.  There was a 7 cm left ovarian cyst.  Further evaluation with MRI was recommended to evaluate for possible neoplasm.  CA125 was 351.3 and CEA 1.1 on 02/21/2018.   Chest CT on 02/25/2018 revealed a 2.3 x 1.5 x 1.0 cm perifissural nodule with spiculated margins in the right middle lobe.  There was a 1.5 x 1.5 x 1.4 cm right lower lobe spiculated nodule.  There was a moderate left sided pleural effusion.    PET scan on 03/08/2018 revealed a 2.5 x 1.8 cm sub solid nodular opacity in the RML (SUV 2.7) and a 1.6 cm rounded slightly spiculated RLL pulmonary nodule (SUV 2.1).  There were no enlarged or hypermetabolic mediastinal or hilar lymph nodes.  There was a small left  pleural effusion.  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  Chest CT on 05/15/2018 revealed no significant changes in the 2 previously noted nodules in the RIGHT lung. Irregular RML lesion measures 2.4 x 1.4 cm (previously 2.3 x 1.5 cm). Spiculated central RLL lesion measures 1.8 x 1.4 cm (previously 1.6 x 1.5 cm). Both areas remain concerning for neoplasm. There was interval decrease in ascites volume overall. Upper abdominal lymphadenopathy noted that was felt to be reactive due to underlying liver disease.   She was admitted to [facility] from 02/09/2018 - 02/12/2018 with a variceal hemorrhage.  EGD on 02/10/2018 revealed 5 columns of grade III esophageal varices in the lower third of the esophagus.  There was stigmata of recent bleeding and red wale signs.  She underwent variceal ligation x 10.   She required 3 units of PRBCs.  EGD on 03/25/2018 revealed 2  angiectasias (non-bleeding) in the second portion of the duodenum, and a few diminutive (non-bleeding) angiectasias in the prepyloric region of the stomach that were treated APC. There were 4 non-bleeding ulcers (Forrest Class III)  found in the gastric fundus. There were 4 columns of large non-bleeding varices in the lower third of the esophagus, of which demonstrated no stigmata of bleeding.  Varices were banded.  EGD on 04/22/2018  revealed one non-bleeding cratered gastric ulcer (Forrest Class III) in the lesser curvature of the gastric body. Duodenal bulb and second portion of the duodenum were normal. Moderate portal hypertensive gastropathy noted in the stomach. Large varices in the lower third of the esophagus noted. 2 bands were placed with incomplete eradication of the lesions.   Colonoscopy on 03/25/2018 revealed a single 5 mm sessile polyp in the ascending colon. Patient has rectal varices and external hemorrhoids. Pathology returned as tubular adenoma, and  was negative for high grade dysplasia and malignancy.   She has iron deficiency anemia.  Ferritin was 6.  She is on ferrous sulfate 325 mg TID.  She has B12 deficiency.  B12 was 103.   Intrinsic factor antibody was negative.  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  She began B12 injections on 03/06/2018 (last 07/03/2018).  Folate was 14.3 on 02/09/2018.  B12 was 485 on 05/24/2018.  Diet is good.  Ferritin has been followed: 6 on 02/09/2018, 16 on 05/24/2018, and 13 on 07/10/2018.  She has hepatitis C antibodies.  HCV RNA was not detected. She has been exposed to hepatitis B.  Anti-smooth muscle antibodies are weakly positive.  ANA was positive (> 1:1280 centromere antibody).  Negative studies included: ceruloplasmin, anti-mitochondrial antibodies, hepatitis B IgM, HIV, and alpha-1 antitrypsin.  PT was 15.2 (INR 1.21).  AFP was 1.5 on 05/28/2018.  RUQ ultrasound on 05/29/2018 revealed cholelithiasis. There was a thickened edematous gallbladder wall. The wall thickening could be due to hypoproteinemia/hypoalbuminemia or cholecystitis. Negative sonographic Murphy's sign.  There was a heterogeneous slightly nodular liver consistent with cirrhosis.  Pelvic ultrasound  on 03/18/2018  demonstrated a simple cyst in the LEFT ovary measuring 6.1 x 4.5 x 6.6 cm. Repeat imaging recommended in 1 year to document stability.   Patient underwent cataract surgery on her LEFT eye on 04/30/2018.  Symptomatically, she denies any new complaints.  She is taking oral iron.  She denies any bleeding.  Hematocrit is 31.0 and hemoglobin 10.1.  MCV is 78.9.  Plan: 1. Labs today:  CBC with diff, ferritin, iron studies. 2. Iron deficiency anemia:  Continue oral iron.  Contact patient today with ferritin results.  If low, begin IV iron.  Preauth Venofer. 3. B12 deficiency:  Schedule B12 injections on 09/18, 10/16, 11/13, and 10/23/2018. 4. Right lower lobe pulmonary nodule:  Chest CT on  08/15/2018.  Patient declines interventions before Christmas. 5. RTC after chest CT for MD assessment, labs (CBC with diff, ferritin-day before), review of chest CT, and +/- Venofer.   Lequita Asal, MD  07/10/2018, 11:01 AM

## 2018-07-10 NOTE — Progress Notes (Signed)
Patient offers no complaints today.

## 2018-07-11 ENCOUNTER — Telehealth: Payer: Self-pay | Admitting: *Deleted

## 2018-07-11 NOTE — Telephone Encounter (Signed)
Called patient and LVM that ferritin is low (13) - goal 100.  MD recommends Venofer weekly X 4.  Will have scheduling call to set up appointments.

## 2018-07-12 ENCOUNTER — Inpatient Hospital Stay: Payer: Medicare HMO

## 2018-07-12 VITALS — BP 110/66 | HR 56 | Temp 95.5°F | Resp 18

## 2018-07-12 DIAGNOSIS — E538 Deficiency of other specified B group vitamins: Secondary | ICD-10-CM | POA: Diagnosis not present

## 2018-07-12 DIAGNOSIS — D510 Vitamin B12 deficiency anemia due to intrinsic factor deficiency: Secondary | ICD-10-CM | POA: Diagnosis not present

## 2018-07-12 MED ORDER — SODIUM CHLORIDE 0.9 % IV SOLN: 200.0000 mg | Freq: Once | INTRAVENOUS | Status: DC

## 2018-07-12 MED ORDER — IRON SUCROSE 20 MG/ML IV SOLN
200.0000 mg | Freq: Once | INTRAVENOUS | Status: AC
  Administered 2018-07-12: 200 mg via INTRAVENOUS
  Filled 2018-07-12: qty 10

## 2018-07-12 MED ORDER — SODIUM CHLORIDE 0.9 % IV SOLN
Freq: Once | INTRAVENOUS | Status: AC
  Administered 2018-07-12: 10:00:00 via INTRAVENOUS
  Filled 2018-07-12: qty 250

## 2018-07-12 NOTE — Patient Instructions (Signed)
Iron Sucrose injection What is this medicine? IRON SUCROSE (AHY ern SOO krohs) is an iron complex. Iron is used to make healthy red blood cells, which carry oxygen and nutrients throughout the body. This medicine is used to treat iron deficiency anemia in people with chronic kidney disease. This medicine may be used for other purposes; ask your health care provider or pharmacist if you have questions. COMMON BRAND NAME(S): Venofer What should I tell my health care provider before I take this medicine? They need to know if you have any of these conditions: -anemia not caused by low iron levels -heart disease -high levels of iron in the blood -kidney disease -liver disease -an unusual or allergic reaction to iron, other medicines, foods, dyes, or preservatives -pregnant or trying to get pregnant -breast-feeding How should I use this medicine? This medicine is for infusion into a vein. It is given by a health care professional in a hospital or clinic setting. Talk to your pediatrician regarding the use of this medicine in children. While this drug may be prescribed for children as young as 2 years for selected conditions, precautions do apply. Overdosage: If you think you have taken too much of this medicine contact a poison control center or emergency room at once. NOTE: This medicine is only for you. Do not share this medicine with others. What if I miss a dose? It is important not to miss your dose. Call your doctor or health care professional if you are unable to keep an appointment. What may interact with this medicine? Do not take this medicine with any of the following medications: -deferoxamine -dimercaprol -other iron products This medicine may also interact with the following medications: -chloramphenicol -deferasirox This list may not describe all possible interactions. Give your health care provider a list of all the medicines, herbs, non-prescription drugs, or dietary  supplements you use. Also tell them if you smoke, drink alcohol, or use illegal drugs. Some items may interact with your medicine. What should I watch for while using this medicine? Visit your doctor or healthcare professional regularly. Tell your doctor or healthcare professional if your symptoms do not start to get better or if they get worse. You may need blood work done while you are taking this medicine. You may need to follow a special diet. Talk to your doctor. Foods that contain iron include: whole grains/cereals, dried fruits, beans, or peas, leafy green vegetables, and organ meats (liver, kidney). What side effects may I notice from receiving this medicine? Side effects that you should report to your doctor or health care professional as soon as possible: -allergic reactions like skin rash, itching or hives, swelling of the face, lips, or tongue -breathing problems -changes in blood pressure -cough -fast, irregular heartbeat -feeling faint or lightheaded, falls -fever or chills -flushing, sweating, or hot feelings -joint or muscle aches/pains -seizures -swelling of the ankles or feet -unusually weak or tired Side effects that usually do not require medical attention (report to your doctor or health care professional if they continue or are bothersome): -diarrhea -feeling achy -headache -irritation at site where injected -nausea, vomiting -stomach upset -tiredness This list may not describe all possible side effects. Call your doctor for medical advice about side effects. You may report side effects to FDA at 1-800-FDA-1088. Where should I keep my medicine? This drug is given in a hospital or clinic and will not be stored at home. NOTE: This sheet is a summary. It may not cover all possible information. If  you have questions about this medicine, talk to your doctor, pharmacist, or health care provider.  2018 Elsevier/Gold Standard (2011-08-10 17:14:35)

## 2018-07-17 ENCOUNTER — Inpatient Hospital Stay: Payer: Medicare HMO | Attending: Hematology and Oncology

## 2018-07-17 VITALS — BP 108/62 | HR 62 | Temp 96.8°F | Resp 18

## 2018-07-17 DIAGNOSIS — D510 Vitamin B12 deficiency anemia due to intrinsic factor deficiency: Secondary | ICD-10-CM | POA: Diagnosis not present

## 2018-07-17 DIAGNOSIS — E538 Deficiency of other specified B group vitamins: Secondary | ICD-10-CM

## 2018-07-17 DIAGNOSIS — D509 Iron deficiency anemia, unspecified: Secondary | ICD-10-CM | POA: Insufficient documentation

## 2018-07-17 MED ORDER — SODIUM CHLORIDE 0.9 % IV SOLN
Freq: Once | INTRAVENOUS | Status: AC
  Administered 2018-07-17: 15:00:00 via INTRAVENOUS
  Filled 2018-07-17: qty 250

## 2018-07-17 MED ORDER — SODIUM CHLORIDE 0.9 % IV SOLN: 200.0000 mg | Freq: Once | INTRAVENOUS | Status: DC

## 2018-07-17 MED ORDER — IRON SUCROSE 20 MG/ML IV SOLN
200.0000 mg | Freq: Once | INTRAVENOUS | Status: AC
  Administered 2018-07-17: 200 mg via INTRAVENOUS
  Filled 2018-07-17 (×2): qty 10

## 2018-07-17 NOTE — Patient Instructions (Signed)
Iron Sucrose injection What is this medicine? IRON SUCROSE (AHY ern SOO krohs) is an iron complex. Iron is used to make healthy red blood cells, which carry oxygen and nutrients throughout the body. This medicine is used to treat iron deficiency anemia in people with chronic kidney disease. This medicine may be used for other purposes; ask your health care provider or pharmacist if you have questions. COMMON BRAND NAME(S): Venofer What should I tell my health care provider before I take this medicine? They need to know if you have any of these conditions: -anemia not caused by low iron levels -heart disease -high levels of iron in the blood -kidney disease -liver disease -an unusual or allergic reaction to iron, other medicines, foods, dyes, or preservatives -pregnant or trying to get pregnant -breast-feeding How should I use this medicine? This medicine is for infusion into a vein. It is given by a health care professional in a hospital or clinic setting. Talk to your pediatrician regarding the use of this medicine in children. While this drug may be prescribed for children as young as 2 years for selected conditions, precautions do apply. Overdosage: If you think you have taken too much of this medicine contact a poison control center or emergency room at once. NOTE: This medicine is only for you. Do not share this medicine with others. What if I miss a dose? It is important not to miss your dose. Call your doctor or health care professional if you are unable to keep an appointment. What may interact with this medicine? Do not take this medicine with any of the following medications: -deferoxamine -dimercaprol -other iron products This medicine may also interact with the following medications: -chloramphenicol -deferasirox This list may not describe all possible interactions. Give your health care provider a list of all the medicines, herbs, non-prescription drugs, or dietary  supplements you use. Also tell them if you smoke, drink alcohol, or use illegal drugs. Some items may interact with your medicine. What should I watch for while using this medicine? Visit your doctor or healthcare professional regularly. Tell your doctor or healthcare professional if your symptoms do not start to get better or if they get worse. You may need blood work done while you are taking this medicine. You may need to follow a special diet. Talk to your doctor. Foods that contain iron include: whole grains/cereals, dried fruits, beans, or peas, leafy green vegetables, and organ meats (liver, kidney). What side effects may I notice from receiving this medicine? Side effects that you should report to your doctor or health care professional as soon as possible: -allergic reactions like skin rash, itching or hives, swelling of the face, lips, or tongue -breathing problems -changes in blood pressure -cough -fast, irregular heartbeat -feeling faint or lightheaded, falls -fever or chills -flushing, sweating, or hot feelings -joint or muscle aches/pains -seizures -swelling of the ankles or feet -unusually weak or tired Side effects that usually do not require medical attention (report to your doctor or health care professional if they continue or are bothersome): -diarrhea -feeling achy -headache -irritation at site where injected -nausea, vomiting -stomach upset -tiredness This list may not describe all possible side effects. Call your doctor for medical advice about side effects. You may report side effects to FDA at 1-800-FDA-1088. Where should I keep my medicine? This drug is given in a hospital or clinic and will not be stored at home. NOTE: This sheet is a summary. It may not cover all possible information. If  you have questions about this medicine, talk to your doctor, pharmacist, or health care provider.  2018 Elsevier/Gold Standard (2011-08-10 17:14:35)

## 2018-07-24 ENCOUNTER — Inpatient Hospital Stay: Payer: Medicare HMO

## 2018-07-24 VITALS — BP 102/64 | HR 60 | Temp 98.1°F | Resp 18

## 2018-07-24 DIAGNOSIS — D509 Iron deficiency anemia, unspecified: Secondary | ICD-10-CM | POA: Diagnosis not present

## 2018-07-24 DIAGNOSIS — D510 Vitamin B12 deficiency anemia due to intrinsic factor deficiency: Secondary | ICD-10-CM | POA: Diagnosis not present

## 2018-07-24 DIAGNOSIS — E538 Deficiency of other specified B group vitamins: Secondary | ICD-10-CM

## 2018-07-24 MED ORDER — SODIUM CHLORIDE 0.9 % IV SOLN: 200.0000 mg | Freq: Once | INTRAVENOUS | Status: DC

## 2018-07-24 MED ORDER — SODIUM CHLORIDE 0.9 % IV SOLN
Freq: Once | INTRAVENOUS | Status: AC
  Administered 2018-07-24: 14:00:00 via INTRAVENOUS
  Filled 2018-07-24: qty 250

## 2018-07-24 MED ORDER — IRON SUCROSE 20 MG/ML IV SOLN
200.0000 mg | Freq: Once | INTRAVENOUS | Status: AC
  Administered 2018-07-24: 200 mg via INTRAVENOUS
  Filled 2018-07-24: qty 10

## 2018-07-26 DIAGNOSIS — R69 Illness, unspecified: Secondary | ICD-10-CM | POA: Diagnosis not present

## 2018-07-27 DIAGNOSIS — R69 Illness, unspecified: Secondary | ICD-10-CM | POA: Diagnosis not present

## 2018-07-31 ENCOUNTER — Ambulatory Visit: Payer: Medicare HMO

## 2018-07-31 ENCOUNTER — Inpatient Hospital Stay: Payer: Medicare HMO

## 2018-07-31 VITALS — BP 112/71 | HR 60 | Resp 18

## 2018-07-31 DIAGNOSIS — D509 Iron deficiency anemia, unspecified: Secondary | ICD-10-CM | POA: Diagnosis not present

## 2018-07-31 DIAGNOSIS — D508 Other iron deficiency anemias: Secondary | ICD-10-CM

## 2018-07-31 DIAGNOSIS — D510 Vitamin B12 deficiency anemia due to intrinsic factor deficiency: Secondary | ICD-10-CM | POA: Diagnosis not present

## 2018-07-31 DIAGNOSIS — E538 Deficiency of other specified B group vitamins: Secondary | ICD-10-CM

## 2018-07-31 MED ORDER — SODIUM CHLORIDE 0.9 % IV SOLN: 200.0000 mg | Freq: Once | INTRAVENOUS | Status: DC

## 2018-07-31 MED ORDER — IRON SUCROSE 20 MG/ML IV SOLN
200.0000 mg | Freq: Once | INTRAVENOUS | Status: AC
  Administered 2018-07-31: 200 mg via INTRAVENOUS
  Filled 2018-07-31 (×2): qty 10

## 2018-07-31 MED ORDER — SODIUM CHLORIDE 0.9 % IV SOLN
Freq: Once | INTRAVENOUS | Status: AC
  Administered 2018-07-31: 15:00:00 via INTRAVENOUS
  Filled 2018-07-31: qty 250

## 2018-07-31 MED ORDER — CYANOCOBALAMIN 1000 MCG/ML IJ SOLN
1000.0000 ug | Freq: Once | INTRAMUSCULAR | Status: AC
  Administered 2018-07-31: 1000 ug via INTRAMUSCULAR
  Filled 2018-07-31: qty 1

## 2018-08-02 ENCOUNTER — Other Ambulatory Visit: Payer: Self-pay

## 2018-08-02 ENCOUNTER — Inpatient Hospital Stay
Admission: EM | Admit: 2018-08-02 | Discharge: 2018-08-03 | DRG: 432 | Disposition: A | Discharge status: Other Acute Inpatient Hospital | Payer: Medicare HMO | Attending: Internal Medicine | Admitting: Internal Medicine

## 2018-08-02 DIAGNOSIS — R188 Other ascites: Secondary | ICD-10-CM | POA: Diagnosis not present

## 2018-08-02 DIAGNOSIS — K7581 Nonalcoholic steatohepatitis (NASH): Secondary | ICD-10-CM | POA: Diagnosis present

## 2018-08-02 DIAGNOSIS — I8511 Secondary esophageal varices with bleeding: Secondary | ICD-10-CM | POA: Diagnosis not present

## 2018-08-02 DIAGNOSIS — Z79899 Other long term (current) drug therapy: Secondary | ICD-10-CM

## 2018-08-02 DIAGNOSIS — E1122 Type 2 diabetes mellitus with diabetic chronic kidney disease: Secondary | ICD-10-CM

## 2018-08-02 DIAGNOSIS — Z7984 Long term (current) use of oral hypoglycemic drugs: Secondary | ICD-10-CM | POA: Diagnosis not present

## 2018-08-02 DIAGNOSIS — K7460 Unspecified cirrhosis of liver: Secondary | ICD-10-CM | POA: Diagnosis not present

## 2018-08-02 DIAGNOSIS — K766 Portal hypertension: Secondary | ICD-10-CM | POA: Diagnosis present

## 2018-08-02 DIAGNOSIS — E119 Type 2 diabetes mellitus without complications: Secondary | ICD-10-CM | POA: Diagnosis present

## 2018-08-02 DIAGNOSIS — D62 Acute posthemorrhagic anemia: Secondary | ICD-10-CM | POA: Diagnosis not present

## 2018-08-02 DIAGNOSIS — K922 Gastrointestinal hemorrhage, unspecified: Secondary | ICD-10-CM | POA: Diagnosis not present

## 2018-08-02 DIAGNOSIS — N183 Chronic kidney disease, stage 3 unspecified: Secondary | ICD-10-CM

## 2018-08-02 DIAGNOSIS — K7469 Other cirrhosis of liver: Secondary | ICD-10-CM | POA: Diagnosis present

## 2018-08-02 DIAGNOSIS — Z8719 Personal history of other diseases of the digestive system: Secondary | ICD-10-CM

## 2018-08-02 LAB — CBC
HCT: 27 % — ABNORMAL LOW (ref 35.0–47.0)
Hemoglobin: 9 g/dL — ABNORMAL LOW (ref 12.0–16.0)
MCH: 27 pg (ref 26.0–34.0)
MCHC: 33.3 g/dL (ref 32.0–36.0)
MCV: 80.9 fL (ref 80.0–100.0)
Platelets: 148 10*3/uL — ABNORMAL LOW (ref 150–440)
RBC: 3.34 MIL/uL — ABNORMAL LOW (ref 3.80–5.20)
RDW: 18.7 % — ABNORMAL HIGH (ref 11.5–14.5)
WBC: 3.2 10*3/uL — ABNORMAL LOW (ref 3.6–11.0)

## 2018-08-02 LAB — COMPREHENSIVE METABOLIC PANEL
ALT: 79 U/L — ABNORMAL HIGH (ref 0–44)
AST: 105 U/L — ABNORMAL HIGH (ref 15–41)
Albumin: 3 g/dL — ABNORMAL LOW (ref 3.5–5.0)
Alkaline Phosphatase: 216 U/L — ABNORMAL HIGH (ref 38–126)
Anion gap: 9 (ref 5–15)
BUN: 19 mg/dL (ref 8–23)
CO2: 24 mmol/L (ref 22–32)
Calcium: 9.3 mg/dL (ref 8.9–10.3)
Chloride: 104 mmol/L (ref 98–111)
Creatinine, Ser: 0.72 mg/dL (ref 0.44–1.00)
GFR calc Af Amer: >60 mL/min (ref >60)
GFR calc non Af Amer: >60 mL/min (ref >60)
Glucose, Bld: 137 mg/dL — ABNORMAL HIGH (ref 70–99)
Potassium: 3.8 mmol/L (ref 3.5–5.1)
Sodium: 137 mmol/L (ref 135–145)
Total Bilirubin: 0.7 mg/dL (ref 0.3–1.2)
Total Protein: 7.1 g/dL (ref 6.5–8.1)

## 2018-08-02 LAB — PROTIME-INR
INR: 1.09
Prothrombin Time: 14 seconds (ref 11.4–15.2)

## 2018-08-02 LAB — LACTIC ACID, PLASMA: Lactic Acid, Venous: 2 mmol/L (ref 0.5–1.9)

## 2018-08-02 MED ORDER — ONDANSETRON HCL 4 MG/2ML IJ SOLN
4.0000 mg | Freq: Once | INTRAMUSCULAR | Status: AC
  Administered 2018-08-02: 4 mg via INTRAVENOUS
  Filled 2018-08-02: qty 2

## 2018-08-02 MED ORDER — SODIUM CHLORIDE 0.9 % IV SOLN
50.0000 ug/h | INTRAVENOUS | Status: DC
  Administered 2018-08-03: 50 ug/h via INTRAVENOUS
  Filled 2018-08-02 (×2): qty 1

## 2018-08-02 MED ORDER — SODIUM CHLORIDE 0.9 % IV SOLN
1.0000 g | Freq: Once | INTRAVENOUS | Status: AC
  Administered 2018-08-03: 1 g via INTRAVENOUS
  Filled 2018-08-02: qty 10

## 2018-08-02 MED ORDER — SODIUM CHLORIDE 0.9 % IV SOLN
8.0000 mg/h | INTRAVENOUS | Status: DC
  Administered 2018-08-03: 8 mg/h via INTRAVENOUS
  Filled 2018-08-02 (×2): qty 80

## 2018-08-02 MED ORDER — OCTREOTIDE LOAD VIA INFUSION
50.0000 ug | Freq: Once | INTRAVENOUS | Status: DC
  Filled 2018-08-02: qty 25

## 2018-08-02 MED ORDER — SODIUM CHLORIDE 0.9 % IV SOLN
80.0000 mg | Freq: Once | INTRAVENOUS | Status: AC
  Administered 2018-08-03: 80 mg via INTRAVENOUS
  Filled 2018-08-02: qty 80

## 2018-08-02 MED ORDER — PANTOPRAZOLE SODIUM 40 MG IV SOLR: 40.0000 mg | Freq: Two times a day (BID) | INTRAVENOUS | Status: DC

## 2018-08-02 NOTE — ED Provider Notes (Signed)
[facility] Emergency Department Provider Note  ____________________________________________  Time seen: Approximately 9:41 PM  I have reviewed the triage vital signs and the nursing notes.   HISTORY  Chief Complaint Rectal Bleeding   HPI Crystal [name] is a 67 y.o. female with a history of hepatitis C, cirrhosis of the liver complicated by GI bleed and varices, diabetes who presents for evaluation of rectal bleeding. Patient reports that her symptoms started an hour ago. She had one large episode of bloody stool. She described is as red and not melena. she has had nausea but no vomiting. No abdominal pain, no chest pain, no dizziness or shortness of breath. She is not on blood thinners. Patient has had prior GI bleeds from varices.  Past Medical History:  Diagnosis Date  . Anemia    TAKES IRON TAB  . Arthritis    SHOULDER  . Bleeding    GI  3/19  . Bronchitis    HX OF  . Cirrhosis ([place])   . Diabetes mellitus without complication ([place])    TYPE 2  . Fatty (change of) liver, not elsewhere classified   . Full dentures    UPPER AND LOWER  . Lung nodule, multiple     Patient Active Problem List   Diagnosis Date Noted  . Colon cancer screening   . Hx of esophageal varices   . Cirrhosis of liver with ascites ([place])   . AVM (arteriovenous malformation) of small bowel, acquired   . PUD (peptic ulcer disease)   . Adnexal cyst 03/16/2018  . Goals of care, counseling/discussion 03/16/2018  . Right middle lobe pulmonary nodule 03/14/2018  . Pernicious anemia 02/22/2018  . Right lower lobe pulmonary nodule 02/18/2018  . Iron deficiency anemia due to chronic blood loss 02/18/2018  . B12 deficiency 02/18/2018  . 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 Hepatitis C virus infection without hepatic coma 02/18/2018  . Upper GI bleed 02/09/2018    Past Surgical History:  Procedure Laterality Date  . CATARACT EXTRACTION W/PHACO Right 02/06/2018     Procedure: CATARACT EXTRACTION PHACO AND INTRAOCULAR LENS PLACEMENT ([place]) RIGHT DIABETIC;  Surgeon: Leandrew Koyanagi, MD;  Location: [facility];  Service: Ophthalmology;  Laterality: Right;  DIABETIC, ORAL MED  . CATARACT EXTRACTION W/PHACO Left 04/30/2018   Procedure: CATARACT EXTRACTION PHACO AND INTRAOCULAR LENS PLACEMENT (I[facility]);  Surgeon: Leandrew Koyanagi, MD;  Location: [facility];  Service: Ophthalmology;  Laterality: Left;  [place] 01:04 AP% 21.3 CDE 13.66 Fluid pack lot # [id number]  . COLONOSCOPY WITH PROPOFOL N/A 03/25/2018   Procedure: COLONOSCOPY WITH PROPOFOL;  Surgeon: Lin Landsman, MD;  Location: [facility] ENDOSCOPY;  Service: Gastroenterology;  Laterality: N/A;  . DENTAL SURGERY     EXTRACTIONS  . ESOPHAGOGASTRODUODENOSCOPY (EGD) WITH PROPOFOL N/A 02/10/2018   Procedure: ESOPHAGOGASTRODUODENOSCOPY (EGD) WITH PROPOFOL;  Surgeon: Jonathon Bellows, MD;  Location: [facility] ENDOSCOPY;  Service: Gastroenterology;  Laterality: N/A;  . ESOPHAGOGASTRODUODENOSCOPY (EGD) WITH PROPOFOL N/A 03/25/2018   Procedure: ESOPHAGOGASTRODUODENOSCOPY (EGD) WITH PROPOFOL;  Surgeon: Lin Landsman, MD;  Location: [facility] ENDOSCOPY;  Service: Gastroenterology;  Laterality: N/A;  . ESOPHAGOGASTRODUODENOSCOPY (EGD) WITH PROPOFOL N/A 04/22/2018   Procedure: ESOPHAGOGASTRODUODENOSCOPY (EGD) WITH PROPOFOL with band ligation;  Surgeon: Lin Landsman, MD;  Location: [facility];  Service: Gastroenterology;  Laterality: N/A;  . ESOPHAGOGASTRODUODENOSCOPY (EGD) WITH PROPOFOL N/A 06/24/2018   Procedure: ESOPHAGOGASTRODUODENOSCOPY (EGD) WITH PROPOFOL;  Surgeon: Lin Landsman, MD;  Location: [facility] ENDOSCOPY;  Service: Gastroenterology;  Laterality: N/A;  . EYE SURGERY    .  HEMORRHOID BANDING  03/25/2018   Procedure: HEMORRHOID BANDING;  Surgeon: Lin Landsman, MD;  Location: [facility] ENDOSCOPY;  Service: Gastroenterology;;    Prior to Admission medications   Medication Sig Start Date End Date Taking?  Authorizing Provider  ferrous sulfate 325 (65 FE) MG tablet Take 1 tablet (325 mg total) by mouth daily. 02/13/18   Bettey Costa, MD  furosemide (LASIX) 20 MG tablet Take 2 tablets (40 mg total) by mouth daily. 05/24/18 11/20/18  Lin Landsman, MD  metFORMIN (GLUCOPHAGE) 500 MG tablet Take 1,000 mg by mouth 2 (two) times daily as needed (for BS < 100).     [provider]  nystatin (MYCOSTATIN/NYSTOP) powder  05/23/18   [provider]  omeprazole (PRILOSEC) 40 MG capsule Take 1 capsule (40 mg total) by mouth 2 (two) times daily before a meal. 05/24/18 11/20/18  Vanga, Tally Due, MD  polyethylene glycol powder (GLYCOLAX/MIRALAX) powder Take 17 g by mouth 2 (two) times daily. 02/14/18   Lin Landsman, MD  propranolol (INDERAL) 20 MG tablet Take 1 tablet (20 mg total) by mouth 2 (two) times daily. 02/14/18 08/13/18  Lin Landsman, MD  spironolactone (ALDACTONE) 50 MG tablet Take 2 tablets (100 mg total) by mouth daily. 05/24/18 11/20/18  Lin Landsman, MD  Vitamin D, Ergocalciferol, (DRISDOL) 50000 units CAPS capsule TAKE ONE CAPSULE BY MOUTH ONE TIME PER WEEK 05/27/18   Lin Landsman, MD    Allergies Patient has no known allergies.  Family History  Problem Relation Age of Onset  . CAD Father   . Cancer Maternal Uncle     Social History Social History   Tobacco Use  . Smoking status: Never Smoker  . Smokeless tobacco: Never Used  Substance Use Topics  . Alcohol use: No    Frequency: Never  . Drug use: Never    Review of Systems  Constitutional: Negative for fever. Eyes: Negative for visual changes. ENT: Negative for sore throat. Neck: No neck pain  Cardiovascular: Negative for chest pain. Respiratory: Negative for shortness of breath. Gastrointestinal: Negative for abdominal pain, vomiting or diarrhea. + nausea Genitourinary: Negative for dysuria. + rectal bleeding Musculoskeletal: Negative for back pain. Skin: Negative for  rash. Neurological: Negative for headaches, weakness or numbness. Psych: No SI or HI  ____________________________________________   PHYSICAL EXAM:  VITAL SIGNS: ED Triage Vitals [08/02/18 2109]  Enc Vitals Group     BP (!) 150/67     Pulse Rate 70     Resp 16     Temp 98.7 F (37.1 C)     Temp Source Oral     SpO2 100 %     Weight 165 lb (74.8 kg)     Height 5' 3" (1.6 m)     Head Circumference      Peak Flow      Pain Score 0     Pain Loc      Pain Edu?      Excl. in [place]?     Constitutional: Alert and oriented. Well appearing and in no apparent distress. HEENT:      Head: Normocephalic and atraumatic.         Eyes: Conjunctivae are normal. Sclera is non-icteric.       Mouth/Throat: Mucous membranes are moist.       Neck: Supple with no signs of meningismus. Cardiovascular: Regular rate and rhythm. No murmurs, gallops, or rubs. 2+ symmetrical distal pulses are present in all extremities. No JVD.  Respiratory: Normal respiratory effort. Lungs are clear to auscultation bilaterally. No wheezes, crackles, or rhonchi.  Gastrointestinal: Soft, non tender, and non distended with positive bowel sounds. No rebound or guarding. Genitourinary: rectal exam showing bloody brown stool guaiac positive Musculoskeletal: Nontender with normal range of motion in all extremities. No edema, cyanosis, or erythema of extremities. Neurologic: Normal speech and language. Face is symmetric. Moving all extremities. No gross focal neurologic deficits are appreciated. Skin: Skin is warm, dry and intact. No rash noted. Psychiatric: Mood and affect are normal. Speech and behavior are normal.  ____________________________________________   LABS (all labs ordered are listed, but only abnormal results are displayed)  Labs Reviewed  COMPREHENSIVE METABOLIC PANEL - Abnormal; Notable for the following components:      Result Value   Glucose, Bld 137 (*)    Albumin 3.0 (*)    AST 105 (*)    ALT 79  (*)    Alkaline Phosphatase 216 (*)    All other components within normal limits  CBC - Abnormal; Notable for the following components:   WBC 3.2 (*)    RBC 3.34 (*)    Hemoglobin 9.0 (*)    HCT 27.0 (*)    RDW 18.7 (*)    Platelets 148 (*)    All other components within normal limits  PROTIME-INR  LACTIC ACID, PLASMA  POC OCCULT BLOOD, ED  TYPE AND SCREEN  TYPE AND SCREEN   ____________________________________________  EKG  ED ECG REPORT I, Rudene Re, the attending physician, personally viewed and interpreted this ECG.  Normal sinus rhythm, rate of 67, normal intervals, normal axis, no ST elevations or depressions. No significant changes when compared to prior. ____________________________________________  RADIOLOGY none  ____________________________________________   PROCEDURES  Procedure(s) performed: None Procedures Critical Care performed: yes  CRITICAL CARE Performed by: Rudene Re  ?  Total critical care time: 35 min  Critical care time was exclusive of separately billable procedures and treating other patients.  Critical care was necessary to treat or prevent imminent or life-threatening deterioration.  Critical care was time spent personally by me on the following activities: development of treatment plan with patient and/or surrogate as well as nursing, discussions with consultants, evaluation of patient's response to treatment, examination of patient, obtaining history from patient or surrogate, ordering and performing treatments and interventions, ordering and review of laboratory studies, ordering and review of radiographic studies, pulse oximetry and re-evaluation of patient's condition.  ____________________________________________   INITIAL IMPRESSION / ASSESSMENT AND PLAN / ED COURSE   67 y.o. female with a history of hepatitis C, cirrhosis of the liver complicated by GI bleed and varices, diabetes who presents for evaluation of  rectal bleeding.patient is currently hemodynamically stable, rectal exam showing brown stool which is grossly bloody. Patient with history of variceal bleeding in the past requiring blood transfusion. We'll initiate IV protonix and octreotide bolus and drip for concerns of possible rapid variceal bleed. We'll monitor patient closely for any signs of hemorrhagic shock. We'll check hemoglobin, INR, and basic labs. we'll also give Rocephin since patient is cirrhotic. Anticipate admission.    _________________________ 10:55 PM on 08/02/2018 ----------------------------------------- labs showing hemoglobin of 9. Patient's last hemoglobin on 07/10/18 was 10.1. She remains hemodynamically stable. We'll admit to the hospitalist service.    As part of my medical decision making, I reviewed the following data within the [place] notes reviewed and incorporated, Labs reviewed , EKG interpreted , Old chart reviewed, Discussed with admitting physician ,  Notes from prior ED visits and [place] Controlled Substance Database    Pertinent labs & imaging results that were available during my care of the patient were reviewed by me and considered in my medical decision making (see chart for details).    ____________________________________________   FINAL CLINICAL IMPRESSION(S) / ED DIAGNOSES  Final diagnoses:  Acute GI bleeding      NEW MEDICATIONS STARTED DURING THIS VISIT:  ED Discharge Orders    None       Note:  This document was prepared using Dragon voice recognition software and may include unintentional dictation errors.    Alfred Levins, [place], MD 08/02/18 2256

## 2018-08-02 NOTE — ED Triage Notes (Signed)
Pt states bright red rectal bleeding that began an hour ago. Pt states all day she has felt nauseated, but denies known fever. Pt states she did have abd pain earlier today, but none now.

## 2018-08-02 NOTE — ED Notes (Signed)
AC contacted about IV team consult.

## 2018-08-03 ENCOUNTER — Inpatient Hospital Stay: Payer: Medicare HMO | Admitting: Certified Registered"

## 2018-08-03 ENCOUNTER — Inpatient Hospital Stay: Payer: Medicare HMO

## 2018-08-03 ENCOUNTER — Inpatient Hospital Stay (HOSPITAL_COMMUNITY)
Admission: AD | Admit: 2018-08-03 | Discharge: 2018-08-10 | DRG: 356 | Disposition: A | Discharge status: Home Health | Payer: Medicare HMO | Source: Other Acute Inpatient Hospital | Attending: Internal Medicine | Admitting: Internal Medicine

## 2018-08-03 ENCOUNTER — Encounter: Payer: Self-pay | Admitting: *Deleted

## 2018-08-03 ENCOUNTER — Other Ambulatory Visit: Payer: Self-pay

## 2018-08-03 ENCOUNTER — Encounter
Admission: EM | Disposition: A | Discharge status: Other Acute Inpatient Hospital | Payer: Self-pay | Source: Home / Self Care | Attending: Internal Medicine

## 2018-08-03 DIAGNOSIS — K7581 Nonalcoholic steatohepatitis (NASH): Secondary | ICD-10-CM

## 2018-08-03 DIAGNOSIS — I8501 Esophageal varices with bleeding: Principal | ICD-10-CM | POA: Diagnosis present

## 2018-08-03 DIAGNOSIS — K922 Gastrointestinal hemorrhage, unspecified: Secondary | ICD-10-CM | POA: Diagnosis present

## 2018-08-03 DIAGNOSIS — Y838 Other surgical procedures as the cause of abnormal reaction of the patient, or of later complication, without mention of misadventure at the time of the procedure: Secondary | ICD-10-CM | POA: Diagnosis not present

## 2018-08-03 DIAGNOSIS — Z452 Encounter for adjustment and management of vascular access device: Secondary | ICD-10-CM | POA: Diagnosis not present

## 2018-08-03 DIAGNOSIS — J95821 Acute postprocedural respiratory failure: Secondary | ICD-10-CM | POA: Diagnosis not present

## 2018-08-03 DIAGNOSIS — T4275XA Adverse effect of unspecified antiepileptic and sedative-hypnotic drugs, initial encounter: Secondary | ICD-10-CM | POA: Diagnosis present

## 2018-08-03 DIAGNOSIS — Y92239 Unspecified place in hospital as the place of occurrence of the external cause: Secondary | ICD-10-CM | POA: Diagnosis not present

## 2018-08-03 DIAGNOSIS — Z792 Long term (current) use of antibiotics: Secondary | ICD-10-CM | POA: Diagnosis not present

## 2018-08-03 DIAGNOSIS — R188 Other ascites: Secondary | ICD-10-CM

## 2018-08-03 DIAGNOSIS — N183 Chronic kidney disease, stage 3 unspecified: Secondary | ICD-10-CM

## 2018-08-03 DIAGNOSIS — K920 Hematemesis: Secondary | ICD-10-CM | POA: Diagnosis present

## 2018-08-03 DIAGNOSIS — J9601 Acute respiratory failure with hypoxia: Secondary | ICD-10-CM | POA: Diagnosis not present

## 2018-08-03 DIAGNOSIS — K811 Chronic cholecystitis: Secondary | ICD-10-CM | POA: Diagnosis present

## 2018-08-03 DIAGNOSIS — E1122 Type 2 diabetes mellitus with diabetic chronic kidney disease: Secondary | ICD-10-CM

## 2018-08-03 DIAGNOSIS — Z79890 Hormone replacement therapy (postmenopausal): Secondary | ICD-10-CM

## 2018-08-03 DIAGNOSIS — K7460 Unspecified cirrhosis of liver: Principal | ICD-10-CM

## 2018-08-03 DIAGNOSIS — K766 Portal hypertension: Secondary | ICD-10-CM | POA: Diagnosis present

## 2018-08-03 DIAGNOSIS — I8510 Secondary esophageal varices without bleeding: Secondary | ICD-10-CM

## 2018-08-03 DIAGNOSIS — E876 Hypokalemia: Secondary | ICD-10-CM | POA: Diagnosis not present

## 2018-08-03 DIAGNOSIS — Z8719 Personal history of other diseases of the digestive system: Secondary | ICD-10-CM

## 2018-08-03 DIAGNOSIS — K3189 Other diseases of stomach and duodenum: Secondary | ICD-10-CM | POA: Diagnosis not present

## 2018-08-03 DIAGNOSIS — E8881 Metabolic syndrome: Secondary | ICD-10-CM | POA: Diagnosis not present

## 2018-08-03 DIAGNOSIS — D696 Thrombocytopenia, unspecified: Secondary | ICD-10-CM | POA: Diagnosis not present

## 2018-08-03 DIAGNOSIS — Z79899 Other long term (current) drug therapy: Secondary | ICD-10-CM | POA: Diagnosis not present

## 2018-08-03 DIAGNOSIS — Z23 Encounter for immunization: Secondary | ICD-10-CM

## 2018-08-03 DIAGNOSIS — Z794 Long term (current) use of insulin: Secondary | ICD-10-CM

## 2018-08-03 DIAGNOSIS — I361 Nonrheumatic tricuspid (valve) insufficiency: Secondary | ICD-10-CM | POA: Diagnosis not present

## 2018-08-03 DIAGNOSIS — R402233 Coma scale, best verbal response, inappropriate words, at hospital admission: Secondary | ICD-10-CM | POA: Diagnosis present

## 2018-08-03 DIAGNOSIS — R402113 Coma scale, eyes open, never, at hospital admission: Secondary | ICD-10-CM | POA: Diagnosis not present

## 2018-08-03 DIAGNOSIS — Z8619 Personal history of other infectious and parasitic diseases: Secondary | ICD-10-CM

## 2018-08-03 DIAGNOSIS — E119 Type 2 diabetes mellitus without complications: Secondary | ICD-10-CM | POA: Diagnosis present

## 2018-08-03 DIAGNOSIS — R918 Other nonspecific abnormal finding of lung field: Secondary | ICD-10-CM | POA: Diagnosis present

## 2018-08-03 DIAGNOSIS — D62 Acute posthemorrhagic anemia: Secondary | ICD-10-CM | POA: Diagnosis not present

## 2018-08-03 DIAGNOSIS — I8511 Secondary esophageal varices with bleeding: Secondary | ICD-10-CM | POA: Diagnosis present

## 2018-08-03 DIAGNOSIS — Z7984 Long term (current) use of oral hypoglycemic drugs: Secondary | ICD-10-CM | POA: Diagnosis not present

## 2018-08-03 DIAGNOSIS — I864 Gastric varices: Secondary | ICD-10-CM | POA: Diagnosis not present

## 2018-08-03 DIAGNOSIS — Z8711 Personal history of peptic ulcer disease: Secondary | ICD-10-CM

## 2018-08-03 DIAGNOSIS — D500 Iron deficiency anemia secondary to blood loss (chronic): Secondary | ICD-10-CM | POA: Diagnosis not present

## 2018-08-03 DIAGNOSIS — J9690 Respiratory failure, unspecified, unspecified whether with hypoxia or hypercapnia: Secondary | ICD-10-CM | POA: Diagnosis not present

## 2018-08-03 DIAGNOSIS — K921 Melena: Secondary | ICD-10-CM | POA: Diagnosis present

## 2018-08-03 DIAGNOSIS — Z1211 Encounter for screening for malignant neoplasm of colon: Secondary | ICD-10-CM | POA: Diagnosis not present

## 2018-08-03 DIAGNOSIS — I499 Cardiac arrhythmia, unspecified: Secondary | ICD-10-CM | POA: Diagnosis not present

## 2018-08-03 DIAGNOSIS — R402363 Coma scale, best motor response, obeys commands, at hospital admission: Secondary | ICD-10-CM | POA: Diagnosis present

## 2018-08-03 HISTORY — DX: Gastrointestinal hemorrhage, unspecified: K92.2

## 2018-08-03 HISTORY — PX: ESOPHAGOGASTRODUODENOSCOPY: SHX5428

## 2018-08-03 LAB — BLOOD GAS, ARTERIAL
Acid-base deficit: 3.9 mmol/L — ABNORMAL HIGH (ref 0.0–2.0)
Bicarbonate: 19.9 mmol/L — ABNORMAL LOW (ref 20.0–28.0)
FIO2: 0.35
MECHVT: 450 mL
O2 Saturation: 99.4 %
PEEP: 5 cmH2O
Patient temperature: 37
RATE: 15 resp/min
pCO2 arterial: 30 mmHg — ABNORMAL LOW (ref 32.0–48.0)
pH, Arterial: 7.43 (ref 7.350–7.450)
pO2, Arterial: 149 mmHg — ABNORMAL HIGH (ref 83.0–108.0)

## 2018-08-03 LAB — HEMOGLOBIN AND HEMATOCRIT, BLOOD
HCT: 25.1 % — ABNORMAL LOW (ref 35.0–47.0)
Hemoglobin: 8.5 g/dL — ABNORMAL LOW (ref 12.0–16.0)

## 2018-08-03 LAB — CBC
HCT: 27 % — ABNORMAL LOW (ref 35.0–47.0)
Hemoglobin: 9 g/dL — ABNORMAL LOW (ref 12.0–16.0)
MCH: 27 pg (ref 26.0–34.0)
MCHC: 33.4 g/dL (ref 32.0–36.0)
MCV: 80.6 fL (ref 80.0–100.0)
Platelets: 198 10*3/uL (ref 150–440)
RBC: 3.35 MIL/uL — ABNORMAL LOW (ref 3.80–5.20)
RDW: 18.4 % — ABNORMAL HIGH (ref 11.5–14.5)
WBC: 7 10*3/uL (ref 3.6–11.0)

## 2018-08-03 LAB — BASIC METABOLIC PANEL
Anion gap: 9 (ref 5–15)
BUN: 22 mg/dL (ref 8–23)
CO2: 22 mmol/L (ref 22–32)
Calcium: 9.1 mg/dL (ref 8.9–10.3)
Chloride: 106 mmol/L (ref 98–111)
Creatinine, Ser: 0.69 mg/dL (ref 0.44–1.00)
GFR calc Af Amer: >60 mL/min (ref >60)
GFR calc non Af Amer: >60 mL/min (ref >60)
Glucose, Bld: 145 mg/dL — ABNORMAL HIGH (ref 70–99)
Potassium: 4.4 mmol/L (ref 3.5–5.1)
Sodium: 137 mmol/L (ref 135–145)

## 2018-08-03 LAB — GLUCOSE, CAPILLARY
Glucose-Capillary: 163 mg/dL — ABNORMAL HIGH (ref 70–99)
Glucose-Capillary: 159 mg/dL — ABNORMAL HIGH (ref 70–99)

## 2018-08-03 LAB — PROTIME-INR
INR: 1.16
Prothrombin Time: 14.7 seconds (ref 11.4–15.2)

## 2018-08-03 LAB — TYPE AND SCREEN
ABO/RH(D): O POS
Antibody Screen: NEGATIVE

## 2018-08-03 LAB — PREPARE RBC (CROSSMATCH)

## 2018-08-03 LAB — PHOSPHORUS: Phosphorus: 2.8 mg/dL (ref 2.5–4.6)

## 2018-08-03 LAB — HEMOGLOBIN: Hemoglobin: 8.3 g/dL — ABNORMAL LOW (ref 12.0–16.0)

## 2018-08-03 LAB — MAGNESIUM: Magnesium: 1.6 mg/dL — ABNORMAL LOW (ref 1.7–2.4)

## 2018-08-03 LAB — APTT: aPTT: 32 seconds (ref 24–36)

## 2018-08-03 LAB — MRSA PCR SCREENING: MRSA by PCR: NEGATIVE

## 2018-08-03 LAB — TRIGLYCERIDES: Triglycerides: 182 mg/dL — ABNORMAL HIGH (ref <150)

## 2018-08-03 SURGERY — EGD (ESOPHAGOGASTRODUODENOSCOPY)
Anesthesia: General

## 2018-08-03 MED ORDER — ONDANSETRON HCL 4 MG/2ML IJ SOLN
4.0000 mg | Freq: Four times a day (QID) | INTRAMUSCULAR | Status: DC | PRN
  Administered 2018-08-03 (×2): 4 mg via INTRAVENOUS
  Filled 2018-08-03: qty 2

## 2018-08-03 MED ORDER — ROCURONIUM BROMIDE 100 MG/10ML IV SOLN
INTRAVENOUS | Status: DC | PRN
  Administered 2018-08-03: 15 mg via INTRAVENOUS

## 2018-08-03 MED ORDER — SODIUM CHLORIDE 0.9 % IV SOLN
8.0000 mg/h | INTRAVENOUS | Status: DC
  Administered 2018-08-03 – 2018-08-04 (×2): 8 mg/h via INTRAVENOUS
  Filled 2018-08-03 (×4): qty 80

## 2018-08-03 MED ORDER — SODIUM CHLORIDE 0.9 % IV SOLN: 50.0000 ug/h | INTRAVENOUS | Status: DC

## 2018-08-03 MED ORDER — MORPHINE SULFATE (PF) 2 MG/ML IV SOLN
INTRAVENOUS | Status: AC
  Filled 2018-08-03: qty 1

## 2018-08-03 MED ORDER — INSULIN ASPART 100 UNIT/ML ~~LOC~~ SOLN
0.0000 [IU] | Freq: Four times a day (QID) | SUBCUTANEOUS | Status: DC
  Administered 2018-08-03: 2 [IU] via SUBCUTANEOUS
  Filled 2018-08-03: qty 1

## 2018-08-03 MED ORDER — DEXAMETHASONE SODIUM PHOSPHATE 10 MG/ML IJ SOLN
INTRAMUSCULAR | Status: AC
  Filled 2018-08-03: qty 1

## 2018-08-03 MED ORDER — LIDOCAINE HCL (PF) 2 % IJ SOLN
INTRAMUSCULAR | Status: AC
  Filled 2018-08-03: qty 10

## 2018-08-03 MED ORDER — DEXAMETHASONE SODIUM PHOSPHATE 10 MG/ML IJ SOLN
INTRAMUSCULAR | Status: DC | PRN
  Administered 2018-08-03: 10 mg via INTRAVENOUS

## 2018-08-03 MED ORDER — MIDAZOLAM HCL 2 MG/2ML IJ SOLN
INTRAMUSCULAR | Status: AC
  Filled 2018-08-03: qty 2

## 2018-08-03 MED ORDER — ONDANSETRON HCL 4 MG/2ML IJ SOLN
INTRAMUSCULAR | Status: AC
  Filled 2018-08-03: qty 2

## 2018-08-03 MED ORDER — FENTANYL CITRATE (PF) 100 MCG/2ML IJ SOLN
INTRAMUSCULAR | Status: AC
  Filled 2018-08-03: qty 2

## 2018-08-03 MED ORDER — OCTREOTIDE LOAD VIA INFUSION: 50.0000 ug | Freq: Once | INTRAVENOUS | 0 refills | Status: DC

## 2018-08-03 MED ORDER — PANTOPRAZOLE SODIUM 40 MG IV SOLR: 40.0000 mg | Freq: Two times a day (BID) | INTRAVENOUS | Status: DC

## 2018-08-03 MED ORDER — MORPHINE SULFATE (PF) 2 MG/ML IV SOLN
2.0000 mg | Freq: Once | INTRAVENOUS | Status: AC
  Administered 2018-08-03: 2 mg via INTRAVENOUS

## 2018-08-03 MED ORDER — SODIUM CHLORIDE 0.9% IV SOLUTION
Freq: Once | INTRAVENOUS | Status: AC
  Administered 2018-08-03: 12:00:00 via INTRAVENOUS

## 2018-08-03 MED ORDER — SODIUM CHLORIDE 0.9 % IV BOLUS
1000.0000 mL | Freq: Once | INTRAVENOUS | Status: AC
  Administered 2018-08-03: 1000 mL via INTRAVENOUS

## 2018-08-03 MED ORDER — SUCCINYLCHOLINE CHLORIDE 20 MG/ML IJ SOLN
INTRAMUSCULAR | Status: AC
  Filled 2018-08-03: qty 1

## 2018-08-03 MED ORDER — PHENYLEPHRINE HCL 10 MG/ML IJ SOLN
INTRAMUSCULAR | Status: DC | PRN
  Administered 2018-08-03 (×5): 100 ug via INTRAVENOUS

## 2018-08-03 MED ORDER — PHENYLEPHRINE HCL 10 MG/ML IJ SOLN
INTRAMUSCULAR | Status: AC
  Filled 2018-08-03: qty 1

## 2018-08-03 MED ORDER — ONDANSETRON HCL 4 MG PO TABS: 4.0000 mg | ORAL_TABLET | Freq: Four times a day (QID) | ORAL | 0 refills | Status: DC | PRN

## 2018-08-03 MED ORDER — SODIUM CHLORIDE 0.9% IV SOLUTION: 10.0000 mL | Freq: Once | INTRAVENOUS | 0 refills | Status: DC

## 2018-08-03 MED ORDER — CHLORHEXIDINE GLUCONATE 0.12% ORAL RINSE (MEDLINE KIT): 15.0000 mL | Freq: Two times a day (BID) | OROMUCOSAL | Status: DC

## 2018-08-03 MED ORDER — SODIUM CHLORIDE 0.9 % IV SOLN
INTRAVENOUS | Status: DC | PRN
  Administered 2018-08-03: 08:00:00 via INTRAVENOUS

## 2018-08-03 MED ORDER — SUCCINYLCHOLINE CHLORIDE 20 MG/ML IJ SOLN
INTRAMUSCULAR | Status: DC | PRN
  Administered 2018-08-03: 100 mg via INTRAVENOUS

## 2018-08-03 MED ORDER — SODIUM CHLORIDE 0.9 % IV SOLN
1.0000 g | Freq: Every day | INTRAVENOUS | Status: DC
  Administered 2018-08-03: 1 g via INTRAVENOUS
  Filled 2018-08-03: qty 1

## 2018-08-03 MED ORDER — SODIUM CHLORIDE 0.9 % IV SOLN: 250.0000 mL | INTRAVENOUS | Status: DC | PRN

## 2018-08-03 MED ORDER — PROPOFOL 10 MG/ML IV BOLUS
INTRAVENOUS | Status: DC | PRN
  Administered 2018-08-03: 20 mg via INTRAVENOUS
  Administered 2018-08-03: 30 mg via INTRAVENOUS
  Administered 2018-08-03: 20 mg via INTRAVENOUS
  Administered 2018-08-03: 30 mg via INTRAVENOUS
  Administered 2018-08-03: 50 mg via INTRAVENOUS
  Administered 2018-08-03: 40 mg via INTRAVENOUS

## 2018-08-03 MED ORDER — PROPOFOL 1000 MG/100ML IV EMUL
5.0000 ug/kg/min | INTRAVENOUS | Status: DC
  Administered 2018-08-03: 35 ug/kg/min via INTRAVENOUS

## 2018-08-03 MED ORDER — SODIUM CHLORIDE 0.9 % IV SOLN
50.0000 ug/h | INTRAVENOUS | Status: DC
  Administered 2018-08-03 – 2018-08-04 (×3): 25 ug/h via INTRAVENOUS
  Administered 2018-08-05 – 2018-08-07 (×6): 50 ug/h via INTRAVENOUS
  Filled 2018-08-03 (×16): qty 1

## 2018-08-03 MED ORDER — PROPOFOL 500 MG/50ML IV EMUL
INTRAVENOUS | Status: DC | PRN
  Administered 2018-08-03: 50 ug/kg/min via INTRAVENOUS

## 2018-08-03 MED ORDER — ONDANSETRON HCL 4 MG/2ML IJ SOLN: 4.0000 mg | Freq: Four times a day (QID) | INTRAMUSCULAR | 0 refills | Status: DC | PRN

## 2018-08-03 MED ORDER — SODIUM CHLORIDE 0.9 % IV SOLN: 50.0000 mL | INTRAVENOUS | 0 refills | Status: DC

## 2018-08-03 MED ORDER — ORAL CARE MOUTH RINSE
15.0000 mL | OROMUCOSAL | Status: DC
  Administered 2018-08-03: 15 mL via OROMUCOSAL

## 2018-08-03 MED ORDER — MIDAZOLAM HCL 5 MG/5ML IJ SOLN
INTRAMUSCULAR | Status: DC | PRN
  Administered 2018-08-03: 2 mg via INTRAVENOUS

## 2018-08-03 MED ORDER — PROPOFOL 1000 MG/100ML IV EMUL: 5.0000 ug/kg/min | INTRAVENOUS | Status: DC

## 2018-08-03 MED ORDER — PROPOFOL 1000 MG/100ML IV EMUL
5.0000 ug/kg/min | INTRAVENOUS | Status: DC
  Administered 2018-08-03: 80 ug/kg/min via INTRAVENOUS
  Filled 2018-08-03 (×2): qty 100

## 2018-08-03 MED ORDER — SODIUM CHLORIDE 0.9 % IV BOLUS
500.0000 mL | Freq: Once | INTRAVENOUS | Status: AC
  Administered 2018-08-03: 500 mL via INTRAVENOUS

## 2018-08-03 MED ORDER — LIDOCAINE HCL (CARDIAC) PF 100 MG/5ML IV SOSY
PREFILLED_SYRINGE | INTRAVENOUS | Status: DC | PRN
  Administered 2018-08-03: 80 mg via INTRAVENOUS

## 2018-08-03 MED ORDER — ONDANSETRON HCL 4 MG PO TABS: 4.0000 mg | ORAL_TABLET | Freq: Four times a day (QID) | ORAL | Status: DC | PRN

## 2018-08-03 MED ORDER — SODIUM CHLORIDE 0.9 % IV SOLN
500.0000 mg | Freq: Once | INTRAVENOUS | Status: AC
  Administered 2018-08-03: 500 mg via INTRAVENOUS
  Filled 2018-08-03: qty 10

## 2018-08-03 MED ORDER — PROPOFOL 10 MG/ML IV BOLUS
INTRAVENOUS | Status: AC
  Filled 2018-08-03: qty 20

## 2018-08-03 MED ORDER — ROCURONIUM BROMIDE 50 MG/5ML IV SOLN
INTRAVENOUS | Status: AC
  Filled 2018-08-03: qty 1

## 2018-08-03 MED ORDER — INSULIN ASPART 100 UNIT/ML ~~LOC~~ SOLN: 0.0000 [IU] | Freq: Four times a day (QID) | SUBCUTANEOUS | 11 refills | Status: DC

## 2018-08-03 MED ORDER — SODIUM CHLORIDE 0.9 % IV SOLN: 8.0000 mg/h | INTRAVENOUS | Status: DC

## 2018-08-03 MED ORDER — PROPOFOL 500 MG/50ML IV EMUL
INTRAVENOUS | Status: AC
  Filled 2018-08-03: qty 50

## 2018-08-03 MED ORDER — SODIUM CHLORIDE 0.9 % IV SOLN: 1.0000 g | Freq: Every day | INTRAVENOUS | Status: DC

## 2018-08-03 MED ORDER — SODIUM CHLORIDE 0.9% IV SOLUTION: Freq: Once | INTRAVENOUS | Status: DC

## 2018-08-03 MED ORDER — SODIUM CHLORIDE 0.9 % IV SOLN
INTRAVENOUS | Status: DC
  Administered 2018-08-03: 02:00:00 via INTRAVENOUS

## 2018-08-03 MED ORDER — PROMETHAZINE HCL 25 MG/ML IJ SOLN
12.5000 mg | Freq: Once | INTRAMUSCULAR | Status: AC
  Administered 2018-08-03: 12.5 mg via INTRAVENOUS

## 2018-08-03 NOTE — Anesthesia Procedure Notes (Signed)
Procedure Name: Intubation Date/Time: 08/03/2018 8:03 AM Performed by: Sherrine Maples, CRNA Pre-anesthesia Checklist: Patient identified, Patient being monitored, Timeout performed, Emergency Drugs available and Suction available Patient Re-evaluated:Patient Re-evaluated prior to induction Oxygen Delivery Method: Circle system utilized Preoxygenation: Pre-oxygenation with 100% oxygen Induction Type: IV induction, Rapid sequence and Cricoid Pressure applied Laryngoscope Size: Miller and 2 Grade View: Grade I Tube type: Oral Tube size: 7.0 mm Number of attempts: 1 Airway Equipment and Method: Stylet Placement Confirmation: ETT inserted through vocal cords under direct vision,  positive ETCO2 and breath sounds checked- equal and bilateral Secured at: 21 cm Tube secured with: Tape Dental Injury: Teeth and Oropharynx as per pre-operative assessment

## 2018-08-03 NOTE — Progress Notes (Signed)
Patient sedated on vent. Prop, Octreotide, Protonix ordered/transfusing blood. Orders to transfuse blood I unit, bolus given. Report given to Lawana Pai RN at [facility]. [phone]. Report given to care-link. No active bleeding noted. Patient being tx.

## 2018-08-03 NOTE — Progress Notes (Signed)
Seen the pt after the procedures, Intubated, Daughter in room. Plan is to transfer to [facility] health. Daughter is explained by GI and ICU team. No questions.

## 2018-08-03 NOTE — H&P (Signed)
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  Presented yesterday to [facility] with hematemesis and hematochezia.  She underwent upper endoscopy where she was found to have portal hypertensive gastropathy and bleeding from a large 5 mm esophageal varix.  Bleeding was controlled with hemospermia however variceal banding was unsuccessful due to scarring from previous banding procedures.  She was transferred to [place] for consideration of TIPS procedure.  Significant Hospital Events   On arrival she was found to be hemostatically stable with no clinical evidence of bleeding.  IR has thus deferred intervention for now.  Consults: date of consult/date signed off & final recs:  Interventional radiology.  Subjective:  Patient intubated and comfortable at baseline. Daughters upset at lack of definitive management. Objective   Blood pressure (!) 91/56, pulse 62, temperature (!) 96.2 F (35.7 C), temperature source Axillary, resp. rate 16, SpO2 100 %.    Vent Mode: PRVC FiO2 (%):  [30 %-35 %] 30 % Set Rate:  [12 bmp-15 bmp] 12 bmp Vt Set:  [450 mL] 450 mL PEEP:  [5 cmH20] 5 cmH20 Plateau Pressure:  [13 cmH20] 13 cmH20   Intake/Output Summary (Last 24 hours) at 08/03/2018 1637 Last data filed at 08/03/2018 1400 Gross per 24 hour  Intake -  Output 350 ml  Net -350 ml   There were no vitals filed for this visit.  Examination: General: Chronically ill-appearing woman who appears older than her stated age.  Generalized pallor. HENT: ETT and OGT in place with no pressor ulceration.  Scleral pallor but no scleral icterus.  Spider nevi on face. Lungs: Mechanically ventilated.  No asynchrony.  Chest clear to auscultation.  Patient  overbreathing ventilator.  Tolerating pressure support ventilation. Cardiovascular: Extremities warm well perfused.  No JVD.  No edema.  Heart sounds unremarkable. Abdomen: Soft nontender not distended no flank dullness.  No caput. Extremities: IV sites intact. Neuro: Moves all limbs to pain.  No response to voice. GU: Foley catheter in place with adequate urine output.  Resolved Hospital Problem list    Assessment & Plan:   Acute variceal upper GI bleed which appears to have stopped with endoscopic management.  Interventional radiology has deferred further intervention. Patient intubated for airway protection peri-procedurally only.  Will wean sedation for extubation. We will continue pantoprazole and octreotide infusions.  Once extubated will start nonselective beta-blocker.  GI consultation for ongoing management of liver disease.  Acute blood loss anemia.  Stable since admission with 1 unit transfusion.  Acute respiratory failure due to periprocedural sedation.  Rapid ventilator wean and sedation interruption in view of extubation.  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  MELD score of 10.  Currently no other features of decompensation apart from GI bleed which appears to have resolved.  No history or signs of hepatic encephalopathy at this time.  Will proceed to extubation promptly to avoid drug accumulation due to poor hepatic drug clearance.  Type 2 diabetes and metabolic syndrome.  Currently n.p.o.  Will initiate correction sliding scale once extubated and begins taking oral intake.  Can then transition to usual home diabetic regimen.  Disposition / Summary of Today's Plan 08/03/18   Wean sedation and extubate,    Diet: N.p.o. Pain/Anxiety/Delirium protocol (if indicated): Propofol to be  weaned off. VAP protocol (if indicated) currently in place. DVT prophylaxis: SCDs only due to recent GI bleed GI prophylaxis: On pantoprazole infusion Hyperglycemia protocol:  Correction sliding scale only Mobility: Progressive ambulation once extubated Code Status: Full code Family Communication: Daughter is updated.  Frustration at the patient's seeming lack of progress.  Will obtain an elective GI consult tomorrow for ongoing management of her chronic liver disease.  Labs   CBC: Recent Labs  Lab 08/02/18 2212 08/03/18 0341 08/03/18 0548 08/03/18 0932  WBC 3.2* 7.0  --   --   HGB 9.0* 9.0* 8.5* 8.3*  HCT 27.0* 27.0* 25.1*  --   MCV 80.9 80.6  --   --   PLT 148* 198  --   --    Basic Metabolic Panel: Recent Labs  Lab 08/02/18 2212 08/03/18 0341 08/03/18 0548  NA 137 137  --   K 3.8 4.4  --   CL 104 106  --   CO2 24 22  --   GLUCOSE 137* 145*  --   BUN 19 22  --   CREATININE 0.72 0.69  --   CALCIUM 9.3 9.1  --   MG  --   --  1.6*  PHOS  --   --  2.8   GFR: Estimated Creatinine Clearance: 69.8 mL/min (by C-G formula based on SCr of 0.69 mg/dL). Recent Labs  Lab 08/02/18 2212 08/03/18 0341  WBC 3.2* 7.0  LATICACIDVEN 2.0*  --    Liver Function Tests: Recent Labs  Lab 08/02/18 2212  AST 105*  ALT 79*  ALKPHOS 216*  BILITOT 0.7  PROT 7.1  ALBUMIN 3.0*   No results for input(s): LIPASE, AMYLASE in the last 168 hours. No results for input(s): AMMONIA in the last 168 hours. ABG    Component Value Date/Time   PHART 7.43 08/03/2018 1007   PCO2ART 30 (L) 08/03/2018 1007   PO2ART 149 (H) 08/03/2018 1007   HCO3 19.9 (L) 08/03/2018 1007   ACIDBASEDEF 3.9 (H) 08/03/2018 1007   O2SAT 99.4 08/03/2018 1007    Coagulation Profile: Recent Labs  Lab 08/02/18 2212 08/03/18 0548  INR 1.09 1.16   Cardiac Enzymes: No results for input(s): CKTOTAL, CKMB, CKMBINDEX, TROPONINI in the last 168 hours. HbA1C: Hgb A1c MFr Bld  Date/Time Value Ref Range Status  03/15/2018 02:20 PM 5.6 4.8 - 5.6 % Final    Comment:    (NOTE) Pre diabetes:          5.7%-6.4% Diabetes:              >6.4% Glycemic control for   <7.0% adults with diabetes    02/25/2018 12:06 PM 5.1 4.8 - 5.6 % Final    Comment:    (NOTE) Pre diabetes:          5.7%-6.4% Diabetes:              >6.4% Glycemic control for   <7.0% adults with diabetes    CBG: Recent Labs  Lab 08/03/18 1158 08/03/18 1528  GLUCAP 163* 159*    Admitting History of Present Illness.   Patient is currently intubated and sedated.  I reviewed the patient's chart and her history of present illness is summarized as follows.  This is a 67 year old woman with a history of decompensated Karlene Lineman cirrhosis who presented to [facility] on 9/20 with abdominal pain hematemesis and hematochezia.  She underwent upper endoscopy which revealed portal gastropathy large esophageal varices which were deemed to be  the source of her bleeding.  However variceal ligation was unsuccessful due to extensive scarring from prior banding.  She was started on pantoprazole and octreotide infusions.  She had been previously intubated for airway protection prior to endoscopy.  A decision was made to transfer her to [facility] for potential TIPS procedure.  On arrival here she was reviewed by interventional radiology who have this referred the procedure for the moment as the patient currently shows no signs of active bleeding and is hemodynamically stable.  (Please see initial consultation by interventional radiology critical care discharge summary from [place] [facility] [place] consultation for further details.) Review of Systems:   Unable to obtain due to sedation.  Past medical history  She,  has a past medical history of Anemia, Arthritis, Bleeding, Bronchitis, Cirrhosis ([place]), Diabetes mellitus without complication ([place]), Fatty (change of) liver, not elsewhere classified, Full dentures, and Lung nodule, multiple.   Surgical History    Past Surgical History:  Procedure Laterality Date  . CATARACT EXTRACTION W/PHACO Right 02/06/2018   Procedure: CATARACT EXTRACTION PHACO AND INTRAOCULAR LENS  PLACEMENT ([place]) RIGHT DIABETIC;  Surgeon: Leandrew Koyanagi, MD;  Location: [place];  Service: Ophthalmology;  Laterality: Right;  DIABETIC, ORAL MED  . CATARACT EXTRACTION W/PHACO Left 04/30/2018   Procedure: CATARACT EXTRACTION PHACO AND INTRAOCULAR LENS PLACEMENT ([facility]);  Surgeon: Leandrew Koyanagi, MD;  Location: [facility] ORS;  Service: Ophthalmology;  Laterality: Left;  [place] 01:04 AP% 21.3 CDE 13.66 Fluid pack lot # [id number]  . COLONOSCOPY WITH PROPOFOL N/A 03/25/2018   Procedure: COLONOSCOPY WITH PROPOFOL;  Surgeon: Lin Landsman, MD;  Location: [facility] ENDOSCOPY;  Service: Gastroenterology;  Laterality: N/A;  . DENTAL SURGERY     EXTRACTIONS  . ESOPHAGOGASTRODUODENOSCOPY (EGD) WITH PROPOFOL N/A 02/10/2018   Procedure: ESOPHAGOGASTRODUODENOSCOPY (EGD) WITH PROPOFOL;  Surgeon: Jonathon Bellows, MD;  Location: [facility] ENDOSCOPY;  Service: Gastroenterology;  Laterality: N/A;  . ESOPHAGOGASTRODUODENOSCOPY (EGD) WITH PROPOFOL N/A 03/25/2018   Procedure: ESOPHAGOGASTRODUODENOSCOPY (EGD) WITH PROPOFOL;  Surgeon: Lin Landsman, MD;  Location: [facility] ENDOSCOPY;  Service: Gastroenterology;  Laterality: N/A;  . ESOPHAGOGASTRODUODENOSCOPY (EGD) WITH PROPOFOL N/A 04/22/2018   Procedure: ESOPHAGOGASTRODUODENOSCOPY (EGD) WITH PROPOFOL with band ligation;  Surgeon: Lin Landsman, MD;  Location: [place];  Service: Gastroenterology;  Laterality: N/A;  . ESOPHAGOGASTRODUODENOSCOPY (EGD) WITH PROPOFOL N/A 06/24/2018   Procedure: ESOPHAGOGASTRODUODENOSCOPY (EGD) WITH PROPOFOL;  Surgeon: Lin Landsman, MD;  Location: [facility] ENDOSCOPY;  Service: Gastroenterology;  Laterality: N/A;  . EYE SURGERY    . HEMORRHOID BANDING  03/25/2018   Procedure: HEMORRHOID BANDING;  Surgeon: Lin Landsman, MD;  Location: [facility] ENDOSCOPY;  Service: Gastroenterology;;     Social History   Social History   Socioeconomic History  . Marital status: Single    Spouse name: Not on file  . Number of  children: Not on file  . Years of education: Not on file  . Highest education level: Not on file  Occupational History  . Not on file  Social Needs  . Financial resource strain: Not on file  . Food insecurity:    Worry: Not on file    Inability: Not on file  . Transportation needs:    Medical: Not on file    Non-medical: Not on file  Tobacco Use  . Smoking status: Never Smoker  . Smokeless tobacco: Never Used  Substance and Sexual Activity  . Alcohol use: No    Frequency: Never  . Drug use: Never  . Sexual activity: Not on file  Lifestyle  . Physical activity:    Days per week: Not on file    Minutes per session: Not on file  . Stress: Not on file  Relationships  . Social connections:    Talks on phone: Not on file    Gets together: Not on file    Attends religious service: Not on file    Active member of club or organization: Not on file    Attends meetings of clubs or organizations: Not on file    Relationship status: Not on file  . Intimate partner violence:    Fear of current or ex partner: Not on file    Emotionally abused: Not on file    Physically abused: Not on file    Forced sexual activity: Not on file  Other Topics Concern  . Not on file  Social History Narrative   Independent at baseline. Lives at home with family  ,  reports that she has never smoked. She has never used smokeless tobacco. She reports that she does not drink alcohol or use drugs.   Family history   Her family history includes CAD in her father; Cancer in her maternal uncle.   Allergies No Known Allergies  Home meds  Prior to Admission medications   Medication Sig Start Date End Date Taking? Authorizing Provider  cefTRIAXone 1 g in sodium chloride 0.9 % 100 mL Inject 1 g into the vein daily. 08/04/18   Rosine Door, MD  cyanocobalamin (,VITAMIN B-12,) 1000 MCG/ML injection Inject 1,000 mcg into the muscle once.    [provider]  docusate sodium (COLACE) 100 MG capsule Take  100 mg by mouth daily.    [provider]  ferrous sulfate 325 (65 FE) MG tablet Take 1 tablet (325 mg total) by mouth daily. Patient not taking: Reported on 08/02/2018 02/13/18   Bettey Costa, MD  furosemide (LASIX) 20 MG tablet Take 2 tablets (40 mg total) by mouth daily. 05/24/18 11/20/18  Lin Landsman, MD  insulin aspart (NOVOLOG) 100 UNIT/ML injection Inject 0-9 Units into the skin every 6 (six) hours. 08/03/18   Rosine Door, MD  metFORMIN (GLUCOPHAGE) 500 MG tablet Take 1,000 mg by mouth 2 (two) times daily with a meal.     [provider]  nystatin (MYCOSTATIN/NYSTOP) powder Apply 1 g topically as needed (rash).  05/23/18   [provider]  octreotide (SANDOSTATIN) 2 mcg/mL SOLN Inject 50 mcg into the vein once for 1 dose. 08/03/18 08/03/18  Rosine Door, MD  octreotide 500 mcg in sodium chloride 0.9 % 250 mL Inject 50 mcg/hr into the vein continuous. 08/03/18   Rosine Door, MD  omeprazole (PRILOSEC) 40 MG capsule Take 1 capsule (40 mg total) by mouth 2 (two) times daily before a meal. 05/24/18 11/20/18  Vanga, Tally Due, MD  ondansetron (ZOFRAN) 4 MG tablet Take 1 tablet (4 mg total) by mouth every 6 (six) hours as needed for nausea. 08/03/18   Rosine Door, MD  ondansetron [facility]) 4 MG/2ML SOLN injection Inject 2 mLs (4 mg total) into the vein every 6 (six) hours as needed for nausea. 08/03/18   Rosine Door, MD  pantoprazole (PROTONIX) 40 MG injection Inject 40 mg into the vein every 12 (twelve) hours. 08/06/18   Rosine Door, MD  pantoprazole 80 mg in sodium chloride 0.9 % 250 mL Inject 8 mg/hr into the vein continuous. 08/03/18   Rosine Door, MD  polyethylene glycol powder [facility]) powder Take 17 g by mouth 2 (two)  times daily. Patient not taking: Reported on 08/02/2018 02/14/18   Lin Landsman, MD  propofol (DIPRIVAN) 1000 MG/100ML EMUL injection Inject [phone] mcg/min into the vein continuous. 08/03/18   Rosine Door, MD  propranolol  (INDERAL) 20 MG tablet Take 1 tablet (20 mg total) by mouth 2 (two) times daily. 02/14/18 08/13/18  Lin Landsman, MD  sodium chloride 0.9 % infusion Inject 50 mLs into the vein continuous. 08/03/18   Rosine Door, MD  sodium chloride 0.9 % infusion Inject 10 mLs into the vein once for 1 dose. 08/03/18 08/03/18  Rosine Door, MD  spironolactone (ALDACTONE) 50 MG tablet Take 2 tablets (100 mg total) by mouth daily. 05/24/18 11/20/18  Lin Landsman, MD  Vitamin D, Ergocalciferol, (DRISDOL) 50000 units CAPS capsule TAKE ONE CAPSULE BY MOUTH ONE TIME PER WEEK Patient not taking: Reported on 08/02/2018 05/27/18   Lin Landsman, MD    CRITICAL CARE Performed by: Kipp Brood   Total critical care time: 40 minutes  Critical care time was exclusive of separately billable procedures and treating other patients.  Critical care was necessary to treat or prevent imminent or life-threatening deterioration.  Critical care was time spent personally by me on the following activities: development of treatment plan with patient and/or surrogate as well as nursing, discussions with consultants, evaluation of patient's response to treatment, examination of patient, obtaining history from patient or surrogate, ordering and performing treatments and interventions, ordering and review of laboratory studies, ordering and review of radiographic studies, pulse oximetry and re-evaluation of patient's condition.  Kipp Brood, MD [facility] ICU Physician [place]  Pager: [phone] Mobile: [phone] After hours: [phone].

## 2018-08-03 NOTE — Consult Note (Signed)
Crystal Darby, MD [address]  [place]  [place], [place] [address]  Main: [phone]  Fax: [phone] Pager: [phone]   Consultation  Referring Provider:     No ref. provider found Primary Care Physician:  Lorelee Market, MD Primary Gastroenterologist:  Dr. Sherri Sear         Reason for Consultation:     Hematemesis, cirrhosis  Date of Admission:  08/02/2018 Date of Consultation:  08/03/2018         HPI:   Crystal [name] is a 67 y.o. white female with metabolic syndrome, NASH cirrhosis, decompensated from ascites and variceal bleed s/p banding in the past, h/o PUD presented to ER last night with rectal bleeding. She felt mild discomfort in her stomach, nausea all day. In ER her Hb was 9, vitals stable, initially admitted to floor. Started on PPI, octreotide drips and rocephin. NPO. Early this morning, pt had large bloody emesis and transferred to ICU. GI is consulted for urgent EGD due to concern for variceal bleed.   When interviewed the patient in ICU, she was coherent, able to answer questions. She denied any abdominal pain. Reported nausea. She did not have further episodes since ICU transfer. Her repeat hemoglobin was 9. Her vitals have been stable. She said she has been taking all her medications including propranolol, furosemide at home. She denies any swelling of legs, abdominal distention. She denied similar episodes of bleeding in last 3 months other than yesterday. Her most recent EGD was on 06/24/2018, repeat banding was not performed at that time due to extensive scarring. She has been on secondary prophylaxis with propranolol and her heart rate was at goal.  She is single, lives with a roommate  NSAIDs: none including BC powder or Goody powder  Antiplts/Anticoagulants/Anti thrombotics: none  GI Procedures:  EGD 06/24/2018 There was no evidence of previously seen gastric ulcer Portal hypertensive gastropathy Large esophageal varices  with red well signs. Banding was not attempted due to extensive scarring from prior banding  EGD 04/22/2018 - Normal duodenal bulb and second portion of the duodenum. - Non-bleeding gastric ulcer with a clean ulcer base (Forrest Class III). - Portal hypertensive gastropathy. - Large (> 5 mm) esophageal varices. Incompletely eradicated. Banded x 2. - No specimens collected.  EGD 03/25/2018 - Two non-bleeding angioectasias in the duodenum. Treated with argon plasma coagulation (APC). - A few non-bleeding angioectasias in the stomach. Treated with argon plasma coagulation (APC). - Non-bleeding gastric ulcers with a clean ulcer base (Forrest Class III). - Non-bleeding large (> 5 mm) esophageal varices. Incompletely eradicated. Banded. - No specimens collected.   Colonoscopy 03/25/2018 - One 5 mm polyp in the ascending colon, removed with a cold snare. Resected and retrieved. - Rectal varices. - External hemorrhoids. - The examination was otherwise normal.  DIAGNOSIS:  A. COLON POLYP, ASCENDING; COLD SNARE:  - TUBULAR ADENOMA.  - NEGATIVE FOR HIGH-GRADE DYSPLASIA AND MALIGNANCY.  EGD 02/09/18 The examined duodenum was normal. The stomach was normal. Five columns of non-bleeding grade III varices were found in the lower third of the esophagus,. Stigmata of recent bleeding were evident and red wale signs were present. Ten bands were successfully placed with incomplete eradication of varices. There was no bleeding during, and at the end, of the procedure.  Past Medical History:  Diagnosis Date  . Anemia    TAKES IRON TAB  . Arthritis    SHOULDER  . Bleeding    GI  3/19  .  Bronchitis    HX OF  . Cirrhosis ([place])   . Diabetes mellitus without complication ([place])    TYPE 2  . Fatty (change of) liver, not elsewhere classified   . Full dentures    UPPER AND LOWER  . Lung nodule, multiple     Past Surgical History:  Procedure Laterality Date  . CATARACT EXTRACTION W/PHACO  Right 02/06/2018   Procedure: CATARACT EXTRACTION PHACO AND INTRAOCULAR LENS PLACEMENT ([place]) RIGHT DIABETIC;  Surgeon: Leandrew Koyanagi, MD;  Location: [place];  Service: Ophthalmology;  Laterality: Right;  DIABETIC, ORAL MED  . CATARACT EXTRACTION W/PHACO Left 04/30/2018   Procedure: CATARACT EXTRACTION PHACO AND INTRAOCULAR LENS PLACEMENT ([facility]);  Surgeon: Leandrew Koyanagi, MD;  Location: [facility];  Service: Ophthalmology;  Laterality: Left;  [place] 01:04 AP% 21.3 CDE 13.66 Fluid pack lot # [id number]  . COLONOSCOPY WITH PROPOFOL N/A 03/25/2018   Procedure: COLONOSCOPY WITH PROPOFOL;  Surgeon: Lin Landsman, MD;  Location: [facility] ENDOSCOPY;  Service: Gastroenterology;  Laterality: N/A;  . DENTAL SURGERY     EXTRACTIONS  . ESOPHAGOGASTRODUODENOSCOPY (EGD) WITH PROPOFOL N/A 02/10/2018   Procedure: ESOPHAGOGASTRODUODENOSCOPY (EGD) WITH PROPOFOL;  Surgeon: Jonathon Bellows, MD;  Location: [facility] ENDOSCOPY;  Service: Gastroenterology;  Laterality: N/A;  . ESOPHAGOGASTRODUODENOSCOPY (EGD) WITH PROPOFOL N/A 03/25/2018   Procedure: ESOPHAGOGASTRODUODENOSCOPY (EGD) WITH PROPOFOL;  Surgeon: Lin Landsman, MD;  Location: [facility] ENDOSCOPY;  Service: Gastroenterology;  Laterality: N/A;  . ESOPHAGOGASTRODUODENOSCOPY (EGD) WITH PROPOFOL N/A 04/22/2018   Procedure: ESOPHAGOGASTRODUODENOSCOPY (EGD) WITH PROPOFOL with band ligation;  Surgeon: Lin Landsman, MD;  Location: [place];  Service: Gastroenterology;  Laterality: N/A;  . ESOPHAGOGASTRODUODENOSCOPY (EGD) WITH PROPOFOL N/A 06/24/2018   Procedure: ESOPHAGOGASTRODUODENOSCOPY (EGD) WITH PROPOFOL;  Surgeon: Lin Landsman, MD;  Location: [facility] ENDOSCOPY;  Service: Gastroenterology;  Laterality: N/A;  . EYE SURGERY    . HEMORRHOID BANDING  03/25/2018   Procedure: HEMORRHOID BANDING;  Surgeon: Lin Landsman, MD;  Location: [facility] ENDOSCOPY;  Service: Gastroenterology;;    Prior to Admission medications   Medication Sig Start Date  End Date Taking? Authorizing Provider  cyanocobalamin (,VITAMIN B-12,) 1000 MCG/ML injection Inject 1,000 mcg into the muscle once.   Yes [provider]  docusate sodium (COLACE) 100 MG capsule Take 100 mg by mouth daily.   Yes [provider]  furosemide (LASIX) 20 MG tablet Take 2 tablets (40 mg total) by mouth daily. 05/24/18 11/20/18 Yes Vanga, Tally Due, MD  metFORMIN (GLUCOPHAGE) 500 MG tablet Take 1,000 mg by mouth 2 (two) times daily with a meal.    Yes [provider]  nystatin (MYCOSTATIN/NYSTOP) powder Apply 1 g topically as needed (rash).  05/23/18  Yes [provider]  omeprazole (PRILOSEC) 40 MG capsule Take 1 capsule (40 mg total) by mouth 2 (two) times daily before a meal. 05/24/18 11/20/18 Yes Vanga, Tally Due, MD  propranolol (INDERAL) 20 MG tablet Take 1 tablet (20 mg total) by mouth 2 (two) times daily. 02/14/18 08/13/18 Yes Vanga, Tally Due, MD  spironolactone (ALDACTONE) 50 MG tablet Take 2 tablets (100 mg total) by mouth daily. 05/24/18 11/20/18 Yes Vanga, Tally Due, MD  ferrous sulfate 325 (65 FE) MG tablet Take 1 tablet (325 mg total) by mouth daily. Patient not taking: Reported on 08/02/2018 02/13/18   Bettey Costa, MD  polyethylene glycol powder (GLYCOLAX/MIRALAX) powder Take 17 g by mouth 2 (two) times daily. Patient not taking: Reported on 08/02/2018 02/14/18   Lin Landsman, MD  Vitamin D, Ergocalciferol, (DRISDOL) 50000  units CAPS capsule TAKE ONE CAPSULE BY MOUTH ONE TIME PER WEEK Patient not taking: Reported on 08/02/2018 05/27/18   Lin Landsman, MD    Current Facility-Administered Medications:  .  0.9 %  sodium chloride infusion (Manually program via Guardrails IV Fluids), , Intravenous, Once, Tukov-Yual, Magdalene S, NP, Stopped at 08/03/18 0531 .  0.9 %  sodium chloride infusion, , Intravenous, Continuous, Lance Coon, MD, Last Rate: 50 mL/hr at 08/03/18 0221 .  cefTRIAXone (ROCEPHIN) 1 g in sodium chloride 0.9 % 100 mL  IVPB, 1 g, Intravenous, Daily, Vanga, Tally Due, MD .  insulin aspart (novoLOG) injection 0-9 Units, 0-9 Units, Subcutaneous, Q6H, Lance Coon, MD .  morphine 2 MG/ML injection, , , ,  .  octreotide (SANDOSTATIN) 2 mcg/mL load via infusion 50 mcg, 50 mcg, Intravenous, Once **AND** octreotide (SANDOSTATIN) 500 mcg in sodium chloride 0.9 % 250 mL (2 mcg/mL) infusion, 50 mcg/hr, Intravenous, Continuous, Lance Coon, MD, Last Rate: 25 mL/hr at 08/03/18 0254, 50 mcg/hr at 08/03/18 0254 .  ondansetron (ZOFRAN) tablet 4 mg, 4 mg, Oral, Q6H PRN **OR** ondansetron (ZOFRAN) injection 4 mg, 4 mg, Intravenous, Q6H PRN, Lance Coon, MD, 4 mg at 08/03/18 0818 .  pantoprazole (PROTONIX) 80 mg in sodium chloride 0.9 % 250 mL (0.32 mg/mL) infusion, 8 mg/hr, Intravenous, Continuous, Lance Coon, MD, Last Rate: 25 mL/hr at 08/03/18 0333, 8 mg/hr at 08/03/18 0333 .  [START ON 08/06/2018] pantoprazole (PROTONIX) injection 40 mg, 40 mg, Intravenous, Q12H, Lance Coon, MD  Facility-Administered Medications Ordered in Other Encounters:  .  0.9 %  sodium chloride infusion, , , Continuous PRN, Short, Deanna L, CRNA .  dexamethasone (DECADRON) injection, , , Anesthesia Intra-op, Short, Deanna L, CRNA, 10 mg at 08/03/18 0818 .  lidocaine (cardiac) 100 mg/49m (XYLOCAINE) injection 2%, , , Anesthesia Intra-op, Short, Deanna L, CRNA, 80 mg at 08/03/18 0745 .  midazolam (VERSED) 5 MG/5ML injection, , , Anesthesia Intra-op, Short, Deanna L, CRNA, 2 mg at 08/03/18 0826 .  phenylephrine (NEO-SYNEPHRINE) injection, , , Anesthesia Intra-op, Short, Deanna L, CRNA, 100 mcg at 08/03/18 00737.  propofol (DIPRIVAN) 10 mg/mL bolus/IV push, , , Anesthesia Intra-op, Short, Deanna L, CRNA, 50 mg at 08/03/18 0803 .  propofol (DIPRIVAN) 500 MG/50ML infusion, , , Continuous PRN, Short, Deanna L, CRNA, Stopped at 08/03/18 0803 .  rocuronium (ZEMURON) injection, , , Anesthesia Intra-op, Short, Deanna L, CRNA, 15 mg at 08/03/18 0813 .   succinylcholine (ANECTINE) injection, , , Anesthesia Intra-op, Short, Deanna L, CRNA, 100 mg at 08/03/18 0802  Family History  Problem Relation Age of Onset  . CAD Father   . Cancer Maternal Uncle      Social History   Tobacco Use  . Smoking status: Never Smoker  . Smokeless tobacco: Never Used  Substance Use Topics  . Alcohol use: No    Frequency: Never  . Drug use: Never    Allergies as of 08/02/2018  . (No Known Allergies)    Review of Systems:    All systems reviewed and negative except where noted in HPI.   Physical Exam:  Vital signs in last 24 hours: Temp:  [97.8 F (36.6 C)-98.7 F (37.1 C)] 97.8 F (36.6 C) (09/21 0847) Pulse Rate:  [70-92] 87 (09/21 0733) Resp:  [15-20] 15 (09/21 0847) BP: (88-150)/(52-76) 110/75 (09/21 0847) SpO2:  [90 %-100 %] 95 % (09/21 0847) Weight:  [74.8 kg-81.2 kg] 81.2 kg (09/21 0733) Last BM Date: 08/03/18 General:   Pleasant, cooperative  in NAD Head:  Normocephalic and atraumatic. Eyes:   No icterus.   Conjunctiva pale. PERRLA. Ears:  Normal auditory acuity. Neck:  Supple; no masses or thyroidomegaly Lungs: Respirations even and unlabored. Lungs clear to auscultation bilaterally.   No wheezes, crackles, or rhonchi.  Heart:  Regular rate and rhythm;  Without murmur, clicks, rubs or gallops Abdomen:  Soft, nondistended, nontender. Normal bowel sounds. No appreciable masses or hepatomegaly.  No rebound or guarding.  Rectal:  Not performed. Msk:  Symmetrical without gross deformities.   Extremities:  Without edema, cyanosis or clubbing. Neurologic:  Alert and oriented x3;  grossly normal neurologically. Skin:  Intact without significant lesions or rashes. Cervical Nodes:  No significant cervical adenopathy. Psych:  Alert and cooperative. Normal affect.  LAB RESULTS: CBC Latest Ref Rng & Units 08/03/2018 08/03/2018 08/02/2018  WBC 3.6 - 11.0 K/uL - 7.0 3.2(L)  Hemoglobin 12.0 - 16.0 g/dL 8.5(L) 9.0(L) 9.0(L)  Hematocrit 35.0 -  47.0 % 25.1(L) 27.0(L) 27.0(L)  Platelets 150 - 440 K/uL - 198 148(L)    BMET BMP Latest Ref Rng & Units 08/03/2018 08/02/2018 05/24/2018  Glucose 70 - 99 mg/dL 145(H) 137(H) 94  BUN 8 - 23 mg/dL _0 Creatinine 0.44 - 1.00 mg/dL 0.69 0.72 0.68  Sodium 135 - 145 mmol/L 137 137 138  Potassium 3.5 - 5.1 mmol/L 4.4 3.8 3.9  Chloride 98 - 111 mmol/L 106 104 102  CO2 22 - 32 mmol/L _1 Calcium 8.9 - 10.3 mg/dL 9.1 9.3 9.5    LFT Hepatic Function Latest Ref Rng & Units 08/02/2018 05/24/2018 03/15/2018  Total Protein 6.5 - 8.1 g/dL 7.1 7.8 8.5(H)  Albumin 3.5 - 5.0 g/dL 3.0(L) 3.5 3.6  AST 15 - 41 U/L 105(H) 68(H) 36  ALT 0 - 44 U/L 79(H) 50(H) 20  Alk Phosphatase 38 - 126 U/L 216(H) 252(H) 241(H)  Total Bilirubin 0.3 - 1.2 mg/dL 0.7 0.7 0.8     STUDIES: No results found.    Impression / Plan:   Crystal [name] is a 67 y.o. female with metabolic syndrome, with decompensated NASH cirrhosis secondary to ascites and variceal bleed from esophageal varices status post variceal ligation admitted with hematemesis  Hematemesis: suspect variceal bleed from esophageal varices,  status post EGD on 02/10/2018, EVLx10, status post EGD 03/25/2018 EVLx4, s/p EGD 04/22/18 EVLx2 and hemospray, status post EGD 06/24/2018 - continue octreotide and pantoprazole drips - Ceftriaxone for SBP prophylaxis - Urgent EGD, if not successful to control bleeding endoscopically, recommend evaluation for TIPS - Monitor serial CBCs  Decompensated cirrhosis: child class B, MELD-Na 9 Secondary liver disease workup positive for HCV antibodies, but viral load not detected, anti-smooth muscle antibodies weakly positive. 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 Ceruloplasmin, antimitochondrial antibodies, hepatitis B, alpha-1 antitrypsin came back negative  Portal hypertension: manifested as ascites, esophageal varices, splenomegaly Currently euvolemic Hold  diuretics and beta blocker in the setting of active GI bleed  [place] screening: AFP normal, no liver lesions on CT in 01/2018 and ultrasound in 05/2018. PSE: none HRS: none Hepatic hydrothorax: none  Thank you for involving me in the care of this patient. Will follow along with you   I have discussed alternative options, risks & benefits,  which include, but are not limited to, bleeding, infection, perforation,respiratory complication & drug reaction.  The patient agrees with this plan & written consent will be obtained.      LOS: 0  days   Sherri Sear, MD  08/03/2018, 9:03 AM   Note: This dictation was prepared with Dragon dictation along with smaller phrase technology. Any transcriptional errors that result from this process are unintentional.

## 2018-08-03 NOTE — Discharge Summary (Addendum)
Physician Discharge Summary  Patient ID: Crystal [name] MRN: [id number] DOB/AGE: Apr 10, 1951 67 y.o.  Admit date: 08/02/2018 Discharge date: 08/03/2018  Admission Diagnoses: UGIB /esophageal variceal   Discharge Diagnoses:  Principal Problem:   Acute GI bleeding Active Problems:   Hx of esophageal varices   Cirrhosis of liver with ascites ([place])   Diabetes ([place])   Discharged Condition: stable  Hospital Course: 67 y/o woman with NASH cirrhosis with h/o PUD and recurrent variceal bleed s/p banding in the past admitted with UGIB/large bloody emesis and underwent EGD in OR this am .As per GI  there was a single bleeding large (> 5 mm) esophageal varix, status post hemospray.There was no bleeding by the end of procedure.Variceal ligation was unsuccessful due to extensive scarring.She was intubated for the procedure and remains intubated.She is stable hemodynamically at present. Repeat H&H is pending .Our plan is to transfer her to [facility] for TIPS.     Consults: GI  GI  Significant Diagnostic Studies: endoscopy: gastroscopy: egd  Treatments: IV hydration,antibiotics,octreotide  Discharge Exam: Blood pressure 123/69, pulse 73, temperature 98.6 F (37 C), resp. rate 15, height 5' 3" (1.6 m), weight 81.2 kg, SpO2 98 %. General appearance: sedated on vent  +Pallor cvs s1 and s2 Chest cta. abd soft non tender CNS sedated on vent Trace edema  Disposition:    Allergies as of 08/03/2018   No Known Allergies     Medication List    TAKE these medications   cefTRIAXone 1 g in sodium chloride 0.9 % 100 mL Inject 1 g into the vein daily. Start taking on:  08/04/2018   cyanocobalamin 1000 MCG/ML injection Commonly known as:  (VITAMIN B-12) Inject 1,000 mcg into the muscle once.   docusate sodium 100 MG capsule Commonly known as:  COLACE Take 100 mg by mouth daily.   ferrous sulfate 325 (65 FE) MG tablet Take 1 tablet (325 mg total) by mouth daily.   furosemide  20 MG tablet Commonly known as:  LASIX Take 2 tablets (40 mg total) by mouth daily.   insulin aspart 100 UNIT/ML injection Commonly known as:  novoLOG Inject 0-9 Units into the skin every 6 (six) hours.   metFORMIN 500 MG tablet Commonly known as:  GLUCOPHAGE Take 1,000 mg by mouth 2 (two) times daily with a meal.   nystatin powder Commonly known as:  MYCOSTATIN/NYSTOP Apply 1 g topically as needed (rash).   octreotide 2 mcg/mL Soln Commonly known as:  SANDOSTATIN Inject 50 mcg into the vein once for 1 dose.   octreotide 500 mcg in sodium chloride 0.9 % 250 mL Inject 50 mcg/hr into the vein continuous.   omeprazole 40 MG capsule Commonly known as:  PRILOSEC Take 1 capsule (40 mg total) by mouth 2 (two) times daily before a meal.   ondansetron 4 MG tablet Commonly known as:  ZOFRAN Take 1 tablet (4 mg total) by mouth every 6 (six) hours as needed for nausea.   ondansetron 4 MG/2ML Soln injection Commonly known as:  ZOFRAN Inject 2 mLs (4 mg total) into the vein every 6 (six) hours as needed for nausea.   pantoprazole 40 MG injection Commonly known as:  PROTONIX Inject 40 mg into the vein every 12 (twelve) hours. Start taking on:  08/06/2018   pantoprazole 80 mg in sodium chloride 0.9 % 250 mL Inject 8 mg/hr into the vein continuous.   polyethylene glycol powder powder Commonly known as:  GLYCOLAX/MIRALAX Take 17 g by mouth 2 (two)  times daily.   propofol 1000 MG/100ML Emul injection Commonly known as:  DIPRIVAN Inject 406-6,496 mcg/min into the vein continuous.   propranolol 20 MG tablet Commonly known as:  INDERAL Take 1 tablet (20 mg total) by mouth 2 (two) times daily.   sodium chloride 0.9 % infusion Inject 50 mLs into the vein continuous.   sodium chloride 0.9 % infusion Inject 10 mLs into the vein once for 1 dose.   spironolactone 50 MG tablet Commonly known as:  ALDACTONE Take 2 tablets (100 mg total) by mouth daily.   Vitamin D (Ergocalciferol)  50000 units Caps capsule Commonly known as:  DRISDOL TAKE ONE CAPSULE BY MOUTH ONE TIME PER WEEK      one unit of blood is being transfused at the time of transfer.  Signed: Rosine Door 08/03/2018, 10:34 AM

## 2018-08-03 NOTE — Progress Notes (Addendum)
EGD post procedure note  - Normal duodenal bulb and second portion of the duodenum. - Portal hypertensive gastropathy. - Single bleeding large (> 5 mm) esophageal varix, status post hemospray. There was no bleeding by the end of procedure - variceal ligation was unsuccessful due to extensive scarring   Recommendations - Return patient to ICU for ongoing care. - NPO. - I have personally discussed the case with Zacarias Pontes on call IR, Dr Eulas Post T. [place] who recommended transfer to [facility] as [facility] does not have the capability to perform TIPS. Dr Bretta Bang will arrange transfer patient to the [facility] ICU, pending bed availability for urgent TIPS due to refractory variceal bleed - Continue octreotide and PPI drips - Rocephin for SBP prophylaxis - monitor CBC closely - Family (daughter) is updated  [name], MD [address]  [place]  [place], [place] [address]  Main: [phone]  Fax: [phone] Pager: [phone]

## 2018-08-03 NOTE — Consult Note (Signed)
PULMONARY / CRITICAL CARE MEDICINE   NAME:  Idonia Zollinger, MRN:  [id number], DOB:  1951/05/16, LOS: 0 ADMISSION DATE:  08/02/2018, CONSULTATION DATE: 08/03/2018 REFERRING MD: Dr. Jannifer Franklin Reason: Acute GI bleed  BRIEF HISTORY:    67 year old female with known cirrhosis, portal hypertension and previous bleeding esophageal varices who presents with recurrent GI bleed HISTORY OF PRESENT ILLNESS   This is a 67 year old female with a history of cirrhosis, portal hypertension and esophageal varices with previous bleeding and banding who was admitted earlier today with complaints of bright red blood per rectum.  Symptoms began at home with nausea and then subsequently patient started passing out bright red blood.  She was admitted to the floor and started on octreotide and Protonix.  Early this morning, patient had a large bloody emesis with a drop in hemoglobin.  She is being transferred to the ICU for further monitoring and management.  GI has been consulted.  Patient is due for an EGD this morning.  Her gastroenterologist is Dr. Marius Ditch.  SIGNIFICANT PAST MEDICAL HISTORY    Past Medical History:  Diagnosis Date  . Anemia    TAKES IRON TAB  . Arthritis    SHOULDER  . Bleeding    GI  3/19  . Bronchitis    HX OF  . Cirrhosis ([place])   . Diabetes mellitus without complication ([place])    TYPE 2  . Fatty (change of) liver, not elsewhere classified   . Full dentures    UPPER AND LOWER  . Lung nodule, multiple     SIGNIFICANT EVENTS:  08/02/2018: Admitted 08/03/2018: Transferred to the ICU STUDIES:   Due for an EGD today CULTURES:  None  ANTIBIOTICS:  Ceftriaxone  LINES/TUBES:   Peripheral IVs CONSULTANTS:  Gastroenterology SUBJECTIVE:  Awake, pleasant and complaining of nausea  CONSTITUTIONAL: BP (!) 97/54   Pulse 75   Temp 97.8 F (36.6 C) (Axillary)   Resp 16   Ht 5' 3" (1.6 m)   Wt 80.6 kg   SpO2 92%   BMI 31.48 kg/m   No intake/output data recorded.  PHYSICAL  EXAM: General: Awake, no acute distress Neuro: Alert and oriented x4, no focal deficits HEENT: PERRLA, no JVD, trachea midline Cardiovascular: Apical pulse regular, S1-S2, no murmur regurg or gallop, +2 pulses bilaterally, no edema Lungs: Lateral breath sounds, no wheezes or rhonchi Abdomen: Distended, mild tach diffuse tenderness on palpation, increased liver span, normal bowel sounds Musculoskeletal: Positive range of motion in upper and lower extremities no deformities Skin: Pale, mild jaundice   ASSESSMENT   Acute GI bleed Cirrhosis of the liver with ascites Esophageal varices with previous variceal hemorrhage Anemia due to blood loss, iron deficiency and B12 deficiency  PLAN More dynamic monitoring per ICU protocol Keep patient n.p.o. Prepare for EGD this morning Continue octreotide and Protonix infusion Transfuse per protocol- 4 units of packed red blood cells ordered from blood bank Zofran and Phenergan as needed for nausea and vomiting Follow-up with GI Continue ceftriaxone Erythromycin 500 mg IV x1 for GI motility and pre-endoscopy per GI  SUMMARY OF TODAY'S PLAN:  Bleeding esophageal varices: Prepared to transfuse, EGD per GI IV fluids and pressors to maintain mean arterial blood pressure greater than 65  Best Practice / Goals of Care / Disposition.   DVT PROPHYLAXIS: Contraindicated due to active bleeding SUP: Already on Protonix infusion NUTRITION: N.p.o., IV fluids MOBILITY: Bedrest with bathroom privileges GOALS OF CARE: Full code FAMILY DISCUSSIONS: Patient updated on plan of care at  bedside DISPOSITION continue to manage in the ICU  LABS  Glucose No results for input(s): GLUCAP in the last 168 hours.  BMET Recent Labs  Lab 08/02/18 2212 08/03/18 0341  NA 137 137  K 3.8 4.4  CL 104 106  CO2 24 22  BUN 19 22  CREATININE 0.72 0.69  GLUCOSE 137* 145*    Liver Enzymes Recent Labs  Lab 08/02/18 2212  AST 105*  ALT 79*  ALKPHOS 216*  BILITOT  0.7  ALBUMIN 3.0*    Electrolytes Recent Labs  Lab 08/02/18 2212 08/03/18 0341  CALCIUM 9.3 9.1    CBC Recent Labs  Lab 08/02/18 2212 08/03/18 0341  WBC 3.2* 7.0  HGB 9.0* 9.0*  HCT 27.0* 27.0*  PLT 148* 198    ABG No results for input(s): PHART, PCO2ART, PO2ART in the last 168 hours.  Coag's Recent Labs  Lab 08/02/18 2212  INR 1.09    Sepsis Markers Recent Labs  Lab 08/02/18 2212  LATICACIDVEN 2.0*    Cardiac Enzymes No results for input(s): TROPONINI, PROBNP in the last 168 hours.  PAST MEDICAL HISTORY :   She  has a past medical history of Anemia, Arthritis, Bleeding, Bronchitis, Cirrhosis ([place]), Diabetes mellitus without complication ([place]), Fatty (change of) liver, not elsewhere classified, Full dentures, and Lung nodule, multiple.  PAST SURGICAL HISTORY:  She  has a past surgical history that includes Dental surgery; Cataract extraction w/PHACO (Right, 02/06/2018); Esophagogastroduodenoscopy (egd) with propofol (N/A, 02/10/2018); Eye surgery; Esophagogastroduodenoscopy (egd) with propofol (N/A, 03/25/2018); Colonoscopy with propofol (N/A, 03/25/2018); Hemorrhoid banding (03/25/2018); Esophagogastroduodenoscopy (egd) with propofol (N/A, 04/22/2018); Cataract extraction w/PHACO (Left, 04/30/2018); and Esophagogastroduodenoscopy (egd) with propofol (N/A, 06/24/2018).  No Known Allergies  No current facility-administered medications on file prior to encounter.    Current Outpatient Medications on File Prior to Encounter  Medication Sig  . cyanocobalamin (,VITAMIN B-12,) 1000 MCG/ML injection Inject 1,000 mcg into the muscle once.  . docusate sodium (COLACE) 100 MG capsule Take 100 mg by mouth daily.  . furosemide (LASIX) 20 MG tablet Take 2 tablets (40 mg total) by mouth daily.  . metFORMIN (GLUCOPHAGE) 500 MG tablet Take 1,000 mg by mouth 2 (two) times daily with a meal.   . nystatin (MYCOSTATIN/NYSTOP) powder Apply 1 g topically as needed (rash).   Marland Kitchen omeprazole  (PRILOSEC) 40 MG capsule Take 1 capsule (40 mg total) by mouth 2 (two) times daily before a meal.  . propranolol (INDERAL) 20 MG tablet Take 1 tablet (20 mg total) by mouth 2 (two) times daily.  Marland Kitchen spironolactone (ALDACTONE) 50 MG tablet Take 2 tablets (100 mg total) by mouth daily.  . ferrous sulfate 325 (65 FE) MG tablet Take 1 tablet (325 mg total) by mouth daily. (Patient not taking: Reported on 08/02/2018)  . polyethylene glycol powder (GLYCOLAX/MIRALAX) powder Take 17 g by mouth 2 (two) times daily. (Patient not taking: Reported on 08/02/2018)  . Vitamin D, Ergocalciferol, (DRISDOL) 50000 units CAPS capsule TAKE ONE CAPSULE BY MOUTH ONE TIME PER WEEK (Patient not taking: Reported on 08/02/2018)    FAMILY HISTORY:   Her family history includes CAD in her father; Cancer in her maternal uncle.  SOCIAL HISTORY:  She  reports that she has never smoked. She has never used smokeless tobacco. She reports that she does not drink alcohol or use drugs.  REVIEW OF SYSTEMS:    Constitutional: Negative for fever and chills positive for generalized malaise.  HENT: Negative for congestion and rhinorrhea.  Eyes: Negative for  redness and visual disturbance.  Respiratory: Negative for shortness of breath and wheezing.  Cardiovascular: Negative for chest pain and palpitations.  Gastrointestinal: Positive for nausea , vomiting and abdominal pain and  Loose bloody stools Genitourinary: Negative for dysuria and urgency.  Endocrine: Denies polyuria, polyphagia and heat intolerance Musculoskeletal: Negative for myalgias and arthralgias.  Skin: Negative for pallor and wound.  Neurological: Negative for dizziness and headaches

## 2018-08-03 NOTE — Progress Notes (Signed)
[facility] called stating that they do not have a bed and working on it. I handed phone to Dr Bretta Bang as he originally was told bed was available. At this time no new orders.

## 2018-08-03 NOTE — Progress Notes (Signed)
eLink Physician-Brief Progress Note Patient Name: Crystal [name] DOB: 12-19-1950 MRN: [id number]   Date of Service  08/03/2018  HPI/Events of Note  Patient with known history of Cirrhosis, past variceal bleeding admitted with blood per rectum. Pt started on octreotide and PPI infusions. She had a significant bleed with drop of blood pressure and was transferred to the ICU.  eICU Interventions  Stat Hgb, blood bank to have 4 units PRBC available for transfusion, continue Octreotide/ PPI infusions, I spoke with GI who will come in to scope him tonight, check coags, npo. Pt discussed with NP Tukov.        [name] U Assad Harbeson 08/03/2018, 6:18 AM

## 2018-08-03 NOTE — Progress Notes (Signed)
Crystal [name] regarding giving report to [facility]. At this point she states that there is not an approved bed. She will call for an update.

## 2018-08-03 NOTE — H&P (Signed)
[facility] [place] NAME: Crystal [name]    MR#:  [id number]  DATE OF BIRTH:  1951-08-09  DATE OF ADMISSION:  08/02/2018  PRIMARY CARE PHYSICIAN: Lorelee Market, MD   REQUESTING/REFERRING PHYSICIAN: Alfred Levins, MD  CHIEF COMPLAINT:   Chief Complaint  Patient presents with  . Rectal Bleeding    HISTORY OF PRESENT ILLNESS:  Crystal [name]  is a 67 y.o. female who presents with chief complaint as above.  Patient presents after an episode of bleeding per rectum.  She is a known cirrhotic and has a history of variceal bleeds.  She states that her abdomen was upset and irritated throughout most of the day, and she suspected she was likely bleeding.  She then had a bowel movement in the evening with significant amount of blood.  Her hemoglobin is dropped about one-point since the last check in our system which was a month ago.  Here in the ED her vital signs are stable, she was started on octreotide, pantoprazole, and given a dose of Rocephin.  Hospitalist were called for admission  PAST MEDICAL HISTORY:   Past Medical History:  Diagnosis Date  . Anemia    TAKES IRON TAB  . Arthritis    SHOULDER  . Bleeding    GI  3/19  . Bronchitis    HX OF  . Cirrhosis ([place])   . Diabetes mellitus without complication ([place])    TYPE 2  . Fatty (change of) liver, not elsewhere classified   . Full dentures    UPPER AND LOWER  . Lung nodule, multiple      PAST SURGICAL HISTORY:   Past Surgical History:  Procedure Laterality Date  . CATARACT EXTRACTION W/PHACO Right 02/06/2018   Procedure: CATARACT EXTRACTION PHACO AND INTRAOCULAR LENS PLACEMENT ([place]) RIGHT DIABETIC;  Surgeon: Leandrew Koyanagi, MD;  Location: [place];  Service: Ophthalmology;  Laterality: Right;  DIABETIC, ORAL MED  . CATARACT EXTRACTION W/PHACO Left 04/30/2018   Procedure: CATARACT EXTRACTION PHACO AND INTRAOCULAR LENS PLACEMENT ([facility]);  Surgeon: Leandrew Koyanagi,  MD;  Location: [facility];  Service: Ophthalmology;  Laterality: Left;  [place] 01:04 AP% 21.3 CDE 13.66 Fluid pack lot # [id number]  . COLONOSCOPY WITH PROPOFOL N/A 03/25/2018   Procedure: COLONOSCOPY WITH PROPOFOL;  Surgeon: Lin Landsman, MD;  Location: [facility] ENDOSCOPY;  Service: Gastroenterology;  Laterality: N/A;  . DENTAL SURGERY     EXTRACTIONS  . ESOPHAGOGASTRODUODENOSCOPY (EGD) WITH PROPOFOL N/A 02/10/2018   Procedure: ESOPHAGOGASTRODUODENOSCOPY (EGD) WITH PROPOFOL;  Surgeon: Jonathon Bellows, MD;  Location: [facility] ENDOSCOPY;  Service: Gastroenterology;  Laterality: N/A;  . ESOPHAGOGASTRODUODENOSCOPY (EGD) WITH PROPOFOL N/A 03/25/2018   Procedure: ESOPHAGOGASTRODUODENOSCOPY (EGD) WITH PROPOFOL;  Surgeon: Lin Landsman, MD;  Location: [facility] ENDOSCOPY;  Service: Gastroenterology;  Laterality: N/A;  . ESOPHAGOGASTRODUODENOSCOPY (EGD) WITH PROPOFOL N/A 04/22/2018   Procedure: ESOPHAGOGASTRODUODENOSCOPY (EGD) WITH PROPOFOL with band ligation;  Surgeon: Lin Landsman, MD;  Location: [place];  Service: Gastroenterology;  Laterality: N/A;  . ESOPHAGOGASTRODUODENOSCOPY (EGD) WITH PROPOFOL N/A 06/24/2018   Procedure: ESOPHAGOGASTRODUODENOSCOPY (EGD) WITH PROPOFOL;  Surgeon: Lin Landsman, MD;  Location: [facility] ENDOSCOPY;  Service: Gastroenterology;  Laterality: N/A;  . EYE SURGERY    . HEMORRHOID BANDING  03/25/2018   Procedure: HEMORRHOID BANDING;  Surgeon: Lin Landsman, MD;  Location: [facility] ENDOSCOPY;  Service: Gastroenterology;;     SOCIAL HISTORY:   Social History   Tobacco Use  . Smoking status: Never Smoker  . Smokeless tobacco: Never  Used  Substance Use Topics  . Alcohol use: No    Frequency: Never     FAMILY HISTORY:   Family History  Problem Relation Age of Onset  . CAD Father   . Cancer Maternal Uncle      DRUG ALLERGIES:  No Known Allergies  MEDICATIONS AT HOME:   Prior to Admission medications   Medication Sig Start Date End Date Taking?  Authorizing Provider  cyanocobalamin (,VITAMIN B-12,) 1000 MCG/ML injection Inject 1,000 mcg into the muscle once.   Yes [provider]  docusate sodium (COLACE) 100 MG capsule Take 100 mg by mouth daily.   Yes [provider]  furosemide (LASIX) 20 MG tablet Take 2 tablets (40 mg total) by mouth daily. 05/24/18 11/20/18 Yes Vanga, Tally Due, MD  metFORMIN (GLUCOPHAGE) 500 MG tablet Take 1,000 mg by mouth 2 (two) times daily with a meal.    Yes [provider]  nystatin (MYCOSTATIN/NYSTOP) powder Apply 1 g topically as needed (rash).  05/23/18  Yes [provider]  omeprazole (PRILOSEC) 40 MG capsule Take 1 capsule (40 mg total) by mouth 2 (two) times daily before a meal. 05/24/18 11/20/18 Yes Vanga, Tally Due, MD  propranolol (INDERAL) 20 MG tablet Take 1 tablet (20 mg total) by mouth 2 (two) times daily. 02/14/18 08/13/18 Yes Vanga, Tally Due, MD  spironolactone (ALDACTONE) 50 MG tablet Take 2 tablets (100 mg total) by mouth daily. 05/24/18 11/20/18 Yes Vanga, Tally Due, MD  ferrous sulfate 325 (65 FE) MG tablet Take 1 tablet (325 mg total) by mouth daily. Patient not taking: Reported on 08/02/2018 02/13/18   Bettey Costa, MD  polyethylene glycol powder (GLYCOLAX/MIRALAX) powder Take 17 g by mouth 2 (two) times daily. Patient not taking: Reported on 08/02/2018 02/14/18   Lin Landsman, MD  Vitamin D, Ergocalciferol, (DRISDOL) 50000 units CAPS capsule TAKE ONE CAPSULE BY MOUTH ONE TIME PER WEEK Patient not taking: Reported on 08/02/2018 05/27/18   Lin Landsman, MD    REVIEW OF SYSTEMS:  Review of Systems  Constitutional: Negative for chills, fever, malaise/fatigue and weight loss.  HENT: Negative for ear pain, hearing loss and tinnitus.   Eyes: Negative for blurred vision, double vision, pain and redness.  Respiratory: Negative for cough, hemoptysis and shortness of breath.   Cardiovascular: Negative for chest pain, palpitations, orthopnea and leg  swelling.  Gastrointestinal: Positive for blood in stool. Negative for abdominal pain, constipation, diarrhea, nausea and vomiting.  Genitourinary: Negative for dysuria, frequency and hematuria.  Musculoskeletal: Negative for back pain, joint pain and neck pain.  Skin:       No acne, rash, or lesions  Neurological: Negative for dizziness, tremors, focal weakness and weakness.  Endo/Heme/Allergies: Negative for polydipsia. Does not bruise/bleed easily.  Psychiatric/Behavioral: Negative for depression. The patient is not nervous/anxious and does not have insomnia.      VITAL SIGNS:   Vitals:   08/02/18 2200 08/02/18 2230 08/02/18 2330 08/03/18 0003  BP: 123/73 (!) 114/57 (!) 104/52 (!) 100/54  Pulse:      Resp:      Temp:      TempSrc:      SpO2:      Weight:      Height:       Wt Readings from Last 3 Encounters:  08/02/18 74.8 kg  07/10/18 77.1 kg  06/24/18 77.1 kg    PHYSICAL EXAMINATION:  Physical Exam  Vitals reviewed. Constitutional: She is oriented to person, place, and time. She  appears well-developed and well-nourished. No distress.  HENT:  Head: Normocephalic and atraumatic.  Mouth/Throat: Oropharynx is clear and moist.  Eyes: Pupils are equal, round, and reactive to light. Conjunctivae and EOM are normal. No scleral icterus.  Neck: Normal range of motion. Neck supple. No JVD present. No thyromegaly present.  Cardiovascular: Normal rate, regular rhythm and intact distal pulses. Exam reveals no gallop and no friction rub.  No murmur heard. Respiratory: Effort normal and breath sounds normal. No respiratory distress. She has no wheezes. She has no rales.  GI: Soft. Bowel sounds are normal. She exhibits no distension. There is no tenderness.  Musculoskeletal: Normal range of motion. She exhibits no edema.  No arthritis, no gout  Lymphadenopathy:    She has no cervical adenopathy.  Neurological: She is alert and oriented to person, place, and time. No cranial nerve  deficit.  No dysarthria, no aphasia  Skin: Skin is warm and dry. No rash noted. No erythema.  Psychiatric: She has a normal mood and affect. Her behavior is normal. Judgment and thought content normal.    LABORATORY PANEL:   CBC Recent Labs  Lab 08/02/18 2212  WBC 3.2*  HGB 9.0*  HCT 27.0*  PLT 148*   ------------------------------------------------------------------------------------------------------------------  Chemistries  Recent Labs  Lab 08/02/18 2212  NA 137  K 3.8  CL 104  CO2 24  GLUCOSE 137*  BUN 19  CREATININE 0.72  CALCIUM 9.3  AST 105*  ALT 79*  ALKPHOS 216*  BILITOT 0.7   ------------------------------------------------------------------------------------------------------------------  Cardiac Enzymes No results for input(s): TROPONINI in the last 168 hours. ------------------------------------------------------------------------------------------------------------------  RADIOLOGY:  No results found.  EKG:   Orders placed or performed in visit on 08/02/18  . EKG 12-Lead    IMPRESSION AND PLAN:  Principal Problem:   GI bleed -octreotide, PPI drip, keep n.p.o., no need for transfusion at this time, serial hemoglobin, GI consult Active Problems:   Hx of esophageal varices -continue home meds, work-up as above   Cirrhosis of liver with ascites ([place]) -avoid nephrotoxins, gentle IV fluids for blood pressure support but not so much as to exacerbate her ascites   Diabetes (HCC) -sliding scale insulin with corresponding glucose checks  Chart review performed and case discussed with ED provider. Labs, imaging and/or ECG reviewed by provider and discussed with patient/family. Management plans discussed with the patient and/or family.  DVT PROPHYLAXIS: Mechanical only  GI PROPHYLAXIS:  PPI   ADMISSION STATUS: Inpatient     CODE STATUS: Full Code Status History    Date Active Date Inactive Code Status Order ID Comments User Context    02/09/2018 1220 02/12/2018 2050 Full Code [id number]  Gladstone Lighter, MD Inpatient      TOTAL TIME TAKING CARE OF THIS PATIENT: 45 minutes.   Korbin Mapps [place] 08/03/2018, 12:36 AM  Crystal [name]  Office  [phone]  CC: Primary care physician; Lorelee Market, MD  Note:  This document was prepared using Dragon voice recognition software and may include unintentional dictation errors.

## 2018-08-03 NOTE — Procedures (Signed)
Extubation Procedure Note  Patient Details:   Name: Joeli Fenner Redfield DOB: [birth year] MRN: [id number]   Airway Documentation:    Vent end date: 08/03/18 Vent end time: 1744   Evaluation  O2 sats: stable throughout Complications: No apparent complications Patient did tolerate procedure well. Bilateral Breath Sounds: Clear, Diminished   Yes   Extubated per MD order. Positive cuff leak. Able to vocalize and clear secretions. Bloody secretions noted with extubation.  Saunders Glance 08/03/2018, 5:46 PM

## 2018-08-03 NOTE — Anesthesia Postprocedure Evaluation (Addendum)
Anesthesia Post Note  Patient: Crystal [name]  Procedure(s) Performed: ESOPHAGOGASTRODUODENOSCOPY (EGD) (N/A )  Patient location during evaluation: PACU Anesthesia Type: General Level of consciousness: sedated and patient remains intubated per anesthesia plan Pain management: pain level controlled Vital Signs Assessment: post-procedure vital signs reviewed and stable Respiratory status: respiratory function stable and patient remains intubated per anesthesia plan Cardiovascular status: blood pressure returned to baseline and stable Postop Assessment: no apparent nausea or vomiting Anesthetic complications: no     Last Vitals:  Vitals:   08/03/18 1120 08/03/18 1230  BP: (!) 86/60 (!) 85/57  Pulse: 70 67  Resp: 17 17  Temp:  36.6 C  SpO2: 100%     Last Pain:  Vitals:   08/03/18 1230  TempSrc: Axillary  PainSc:    BP 101/74 on arrival to ICU              Durenda Hurt

## 2018-08-03 NOTE — Progress Notes (Signed)
Spoke with Dr Marius Ditch regarding [facility]. Dr Marius Ditch called care link. They are in the process of attempting to get bed approval. I will call back in 20 minutes to follow up. Per Dr Marius Ditch give patient bolus due to patients blood pressure. Hemoglobin stable at 8.3. No active bleeding at this point.

## 2018-08-03 NOTE — Transfer of Care (Signed)
Immediate Anesthesia Transfer of Care Note  Patient: Crystal [name]  Procedure(s) Performed: ESOPHAGOGASTRODUODENOSCOPY (EGD) (N/A )  Patient Location: PACU and ICU  Anesthesia Type:General  Level of Consciousness: sedated  Airway & Oxygen Therapy: Patient remains intubated per anesthesia plan  Post-op Assessment: Report given to RN  Post vital signs: Reviewed and stable  Last Vitals:  Vitals Value Taken Time  BP 123/69 08/03/2018  9:11 AM  Temp    Pulse 105 08/03/2018  9:13 AM  Resp 22 08/03/2018  9:13 AM  SpO2 100 % 08/03/2018  9:13 AM  Vitals shown include unvalidated device data.  Last Pain:  Vitals:   08/03/18 0847  TempSrc: Axillary  PainSc:          Complications: No apparent anesthesia complications

## 2018-08-03 NOTE — Anesthesia Post-op Follow-up Note (Signed)
Anesthesia QCDR form completed.

## 2018-08-03 NOTE — Progress Notes (Signed)
MD placed patient on wean and wants to extubate. Patient is very awake, following commands, and good spontaneous VT. Waiting on further order

## 2018-08-03 NOTE — Consult Note (Signed)
Chief Complaint: Patient was seen in consultation today for possible TIPS  Referring Physician(s): [place]  Supervising Physician: Aletta Edouard  Patient Status: [facility] - In-pt  History of Present Illness: Crystal [name] is a 67 y.o. 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  She subsequently had a large bloody emesis once in the hospital.  Most recent EGD from 06/24/2018 showed evidence of previously seen gastric ulcer, portal hypertensive gastropathy, large esophageal varices with red well signs.  Banding was not performed due to extensive scarring from prior banding.  EGD performed this morning revealed normal duodenal bulb and second portion of duodenum, portal hypertensive gastropathy with single bleeding large greater than 5 mm esophageal varix. This was treated with hemospray with no bleeding afterwards.  Variceal ligation was unsuccessful due to extensive scarring.  Patient has received 1 unit of packed red cells.  She has since been transferred to [facility] for consideration of TIPS. She is intubated.  Current labs include hemoglobin 8.3, PT 14.7, INR 1.16, creatinine 0.69, potassium 4.4, sodium 137, bilirubin 0.7.  She has also received octreotide, Protonix, propanolol and diprivan.  Systolic BP is currently in the 80s with HR in 60's but no further visible bleeding.  Abdominal ultrasound performed today revealed dilated portal vein with normal flow in the portal vein, cirrhosis, chronic cholecystitis with chronic thickening of the gallbladder wall and small amount of ascites.  Current MELD score is 10.  Past Medical History:  Diagnosis Date  . Anemia    TAKES IRON TAB  . Arthritis    SHOULDER  . Bleeding    GI  3/19  . Bronchitis    HX  OF  . Cirrhosis ([place])   . Diabetes mellitus without complication ([place])    TYPE 2  . Fatty (change of) liver, not elsewhere classified   . Full dentures    UPPER AND LOWER  . Lung nodule, multiple     Past Surgical History:  Procedure Laterality Date  . CATARACT EXTRACTION W/PHACO Right 02/06/2018   Procedure: CATARACT EXTRACTION PHACO AND INTRAOCULAR LENS PLACEMENT ([place]) RIGHT DIABETIC;  Surgeon: Leandrew Koyanagi, MD;  Location: [place];  Service: Ophthalmology;  Laterality: Right;  DIABETIC, ORAL MED  . CATARACT EXTRACTION W/PHACO Left 04/30/2018   Procedure: CATARACT EXTRACTION PHACO AND INTRAOCULAR LENS PLACEMENT (IOC);  Surgeon: Leandrew Koyanagi, MD;  Location: [facility] ORS;  Service: Ophthalmology;  Laterality: Left;  [place] 01:04 AP% 21.3 CDE 13.66 Fluid pack lot # [id number]  . COLONOSCOPY WITH PROPOFOL N/A 03/25/2018   Procedure: COLONOSCOPY WITH PROPOFOL;  Surgeon: Lin Landsman, MD;  Location: [facility] ENDOSCOPY;  Service: Gastroenterology;  Laterality: N/A;  . DENTAL SURGERY     EXTRACTIONS  . ESOPHAGOGASTRODUODENOSCOPY (EGD) WITH PROPOFOL N/A 02/10/2018   Procedure: ESOPHAGOGASTRODUODENOSCOPY (EGD) WITH PROPOFOL;  Surgeon: Jonathon Bellows, MD;  Location: [facility] ENDOSCOPY;  Service: Gastroenterology;  Laterality: N/A;  . ESOPHAGOGASTRODUODENOSCOPY (EGD) WITH PROPOFOL N/A 03/25/2018   Procedure: ESOPHAGOGASTRODUODENOSCOPY (EGD) WITH PROPOFOL;  Surgeon: Lin Landsman, MD;  Location: [facility] ENDOSCOPY;  Service: Gastroenterology;  Laterality: N/A;  . ESOPHAGOGASTRODUODENOSCOPY (EGD) WITH PROPOFOL N/A 04/22/2018   Procedure: ESOPHAGOGASTRODUODENOSCOPY (EGD) WITH PROPOFOL with band ligation;  Surgeon: Lin Landsman, MD;  Location: [place];  Service: Gastroenterology;  Laterality: N/A;  . ESOPHAGOGASTRODUODENOSCOPY (EGD) WITH PROPOFOL N/A 06/24/2018  Procedure: ESOPHAGOGASTRODUODENOSCOPY (EGD) WITH PROPOFOL;  Surgeon: Lin Landsman, MD;  Location: [facility]  ENDOSCOPY;  Service: Gastroenterology;  Laterality: N/A;  . EYE SURGERY    . HEMORRHOID BANDING  03/25/2018   Procedure: HEMORRHOID BANDING;  Surgeon: Lin Landsman, MD;  Location: [facility] ENDOSCOPY;  Service: Gastroenterology;;    Allergies: Patient has no known allergies.  Medications: Prior to Admission medications   Medication Sig Start Date End Date Taking? Authorizing Provider  cefTRIAXone 1 g in sodium chloride 0.9 % 100 mL Inject 1 g into the vein daily. 08/04/18   Rosine Door, MD  cyanocobalamin (,VITAMIN B-12,) 1000 MCG/ML injection Inject 1,000 mcg into the muscle once.    [provider]  docusate sodium (COLACE) 100 MG capsule Take 100 mg by mouth daily.    [provider]  ferrous sulfate 325 (65 FE) MG tablet Take 1 tablet (325 mg total) by mouth daily. Patient not taking: Reported on 08/02/2018 02/13/18   Bettey Costa, MD  furosemide (LASIX) 20 MG tablet Take 2 tablets (40 mg total) by mouth daily. 05/24/18 11/20/18  Lin Landsman, MD  insulin aspart (NOVOLOG) 100 UNIT/ML injection Inject 0-9 Units into the skin every 6 (six) hours. 08/03/18   Rosine Door, MD  metFORMIN (GLUCOPHAGE) 500 MG tablet Take 1,000 mg by mouth 2 (two) times daily with a meal.     [provider]  nystatin (MYCOSTATIN/NYSTOP) powder Apply 1 g topically as needed (rash).  05/23/18   [provider]  octreotide (SANDOSTATIN) 2 mcg/mL SOLN Inject 50 mcg into the vein once for 1 dose. 08/03/18 08/03/18  Rosine Door, MD  octreotide 500 mcg in sodium chloride 0.9 % 250 mL Inject 50 mcg/hr into the vein continuous. 08/03/18   Rosine Door, MD  omeprazole (PRILOSEC) 40 MG capsule Take 1 capsule (40 mg total) by mouth 2 (two) times daily before a meal. 05/24/18 11/20/18  Vanga, Tally Due, MD  ondansetron (ZOFRAN) 4 MG tablet Take 1 tablet (4 mg total) by mouth every 6 (six) hours as needed for nausea. 08/03/18   Rosine Door, MD  ondansetron [facility]) 4 MG/2ML SOLN  injection Inject 2 mLs (4 mg total) into the vein every 6 (six) hours as needed for nausea. 08/03/18   Rosine Door, MD  pantoprazole (PROTONIX) 40 MG injection Inject 40 mg into the vein every 12 (twelve) hours. 08/06/18   Rosine Door, MD  pantoprazole 80 mg in sodium chloride 0.9 % 250 mL Inject 8 mg/hr into the vein continuous. 08/03/18   Rosine Door, MD  polyethylene glycol powder [facility]) powder Take 17 g by mouth 2 (two) times daily. Patient not taking: Reported on 08/02/2018 02/14/18   Lin Landsman, MD  propofol (DIPRIVAN) 1000 MG/100ML EMUL injection Inject [phone] mcg/min into the vein continuous. 08/03/18   Rosine Door, MD  propranolol (INDERAL) 20 MG tablet Take 1 tablet (20 mg total) by mouth 2 (two) times daily. 02/14/18 08/13/18  Lin Landsman, MD  sodium chloride 0.9 % infusion Inject 50 mLs into the vein continuous. 08/03/18   Rosine Door, MD  sodium chloride 0.9 % infusion Inject 10 mLs into the vein once for 1 dose. 08/03/18 08/03/18  Rosine Door, MD  spironolactone (ALDACTONE) 50 MG tablet Take 2 tablets (100 mg total) by mouth daily. 05/24/18 11/20/18  Lin Landsman, MD  Vitamin D, Ergocalciferol, (DRISDOL) 50000 units CAPS capsule TAKE ONE CAPSULE BY MOUTH ONE TIME PER WEEK Patient not taking: Reported on 08/02/2018 05/27/18  Lin Landsman, MD     Family History  Problem Relation Age of Onset  . CAD Father   . Cancer Maternal Uncle     Social History   Socioeconomic History  . Marital status: Single    Spouse name: Not on file  . Number of children: Not on file  . Years of education: Not on file  . Highest education level: Not on file  Occupational History  . Not on file  Social Needs  . Financial resource strain: Not on file  . Food insecurity:    Worry: Not on file    Inability: Not on file  . Transportation needs:    Medical: Not on file    Non-medical: Not on file  Tobacco Use  . Smoking status: Never Smoker  .  Smokeless tobacco: Never Used  Substance and Sexual Activity  . Alcohol use: No    Frequency: Never  . Drug use: Never  . Sexual activity: Not on file  Lifestyle  . Physical activity:    Days per week: Not on file    Minutes per session: Not on file  . Stress: Not on file  Relationships  . Social connections:    Talks on phone: Not on file    Gets together: Not on file    Attends religious service: Not on file    Active member of club or organization: Not on file    Attends meetings of clubs or organizations: Not on file    Relationship status: Not on file  Other Topics Concern  . Not on file  Social History Narrative   Independent at baseline. Lives at home with family      Review of Systems see above; currently intubated  Vital Signs: BP (!) 81/54   Pulse 62   Temp 97.7 F (36.5 C) (Oral)   Resp 16   SpO2 100%   Physical Exam intubated/sedated;  chest clear to auscultation bilaterally.  Heart with regular rate and rhythm.  Abdomen soft, slightly distended, positive bowel sounds, no significant edema.  Trace pretibial edema bilaterally.  Imaging: US Abdomen Limited Ruq  Result Date: 08/03/2018 CLINICAL DATA:  Cirrhosis. EXAM: ULTRASOUND ABDOMEN LIMITED RIGHT UPPER QUADRANT COMPARISON:  Ultrasound dated 05/29/2018 and PET-CT dated 03/08/2018 and CT scan of the abdomen dated 02/09/2018 FINDINGS: Gallbladder: At least 2 multiple gallstones are present. There is gallbladder wall thickening to 8 mm. There is pericholecystic fluid. The appearance is essentially unchanged since the prior exam except for the pericholecystic fluid, which is minimal. Common bile duct: Diameter: 5.5 mm, normal. Liver: No focal lesions. Coarse echotexture of the liver parenchyma with nodularity of the surface of the liver consistent with cirrhosis. Portal vein is patent on color Doppler imaging with normal direction of blood flow towards the liver. The portal vein is distended to 17.4 mm. Slight  ascites around the liver. IMPRESSION: 1. Dilated portal vein. However, there is normal flow in the portal vein. 2. Cirrhosis. 3. Chronic cholecystitis with chronic thickening of the gallbladder wall. 4. Small amount of ascites including around the gallbladder. Electronically Signed   By: Lorriane Shire M.D.   On: 08/03/2018 12:12    Labs:  CBC: Recent Labs    05/24/18 1058 07/10/18 1142 08/02/18 2212 08/03/18 0341 08/03/18 0548 08/03/18 0932  WBC 3.1* 3.7 3.2* 7.0  --   --   HGB 10.4* 10.1* 9.0* 9.0* 8.5* 8.3*  HCT 31.8* 31.0* 27.0* 27.0* 25.1*  --   PLT 167  188 148* 198  --   --     COAGS: Recent Labs    02/25/18 1206 03/15/18 1420 08/02/18 2212 08/03/18 0548  INR 1.18 1.09 1.09 1.16  APTT  --   --   --  32    BMP: Recent Labs    04/22/18 0855 05/24/18 1058 08/02/18 2212 08/03/18 0341  NA 137 138 137 137  K 4.0 3.9 3.8 4.4  CL 104 102 104 106  CO2 _0 GLUCOSE 126* 94 137* 145*  BUN _1 CALCIUM 8.8* 9.5 9.3 9.1  CREATININE 0.58 0.68 0.72 0.69  GFRNONAA >60 >60 >60 >60  GFRAA >60 >60 >60 >60    LIVER FUNCTION TESTS: Recent Labs    02/25/18 1206 03/15/18 1420 05/24/18 1058 08/02/18 2212  BILITOT 1.0 0.8 0.7 0.7  AST 30 36 68* 105*  ALT 14 20 50* 79*  ALKPHOS 177* 241* 252* 216*  PROT 7.2 8.5* 7.8 7.1  ALBUMIN 2.9* 3.6 3.5 3.0*    TUMOR MARKERS: No results for input(s): AFPTM, CEA, CA199, CHROMGRNA in the last 8760 hours.  Assessment and Plan:  67 y.o. 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  She subsequently had a large bloody emesis once in the hospital.  Most recent EGD from 06/24/2018 showed evidence of previously seen gastric ulcer, portal hypertensive gastropathy, large esophageal varices with red  well signs.  Banding was not performed due to extensive scarring from prior banding.  EGD performed this morning revealed normal duodenal bulb and second portion of duodenum, portal hypertensive gastropathy with single bleeding large greater than 5 mm esophageal varix. This was treated with hemospray with no bleeding afterwards.  Variceal ligation was unsuccessful due to extensive scarring.  Patient has received 1 unit of packed red cells.  She has since been transferred to [facility] for consideration of TIPS. She is intubated.  Current labs include hemoglobin 8.3, PT 14.7, INR 1.16, creatinine 0.69, potassium 4.4, sodium 137, bilirubin 0.7.  She has also received octreotide, Protonix, propanolol and diprivan.  Systolic BP is currently in the 80s with HR in 60's but no further visible bleeding.  Abdominal ultrasound performed today revealed dilated portal vein with normal flow in the portal vein, cirrhosis, chronic cholecystitis with chronic thickening of the gallbladder wall and small amount of ascites.  Current MELD score is 10 which is considered lower risk for mortality after TIPS. Imaging studies and recent labs have been reviewed by Dr. Kathlene Cote.  Patient currently without evidence of active bleeding.  Indications and details/risks for TIPS procedure/possible paracentesis including but not limited to, internal bleeding, infection, injury to adjacent structures, anesthesia related complications, death discussed with patient's daughter with her understanding and consent.  We will continue to closely monitor labs and patient status and proceed with TIPS if warranted.  Anesthesia assistance will be necessary if TIPS performed.  Recommend continued GI service monitoring as well.     Thank you for this interesting consult.  I greatly enjoyed meeting Dejanira Arlene Lozinski and look forward to participating in their care.  A copy of this report was sent to the requesting provider on this  date.  Electronically Signed: D. Rowe Robert, PA-C 08/03/2018, 3:25 PM   I spent a total of 40 minutes in  face to face in clinical consultation, greater than 50% of which was counseling/coordinating care for

## 2018-08-03 NOTE — ED Notes (Signed)
Lab contacted this RN about type and screen being hemolyzed and needing to be collected for a second time. Lab informed that they would need to send a phlebotomist to collect sample.

## 2018-08-03 NOTE — Anesthesia Preprocedure Evaluation (Addendum)
Anesthesia Evaluation  Patient identified by MRN, date of birth, ID band Patient awake    Reviewed: Allergy & Precautions, H&P , NPO status , Patient's Chart, lab work & pertinent test results  Airway Mallampati: II  TM Distance: >3 FB Neck ROM: full    Dental  (+) Edentulous Lower, Edentulous Upper   Pulmonary neg pulmonary ROS,    breath sounds clear to auscultation       Cardiovascular negative cardio ROS   Rhythm:regular Rate:Normal     Neuro/Psych negative neurological ROS  negative psych ROS   GI/Hepatic PUD, (+) Cirrhosis   Esophageal Varices    , Hepatitis -, CEsophageal varices s/p banding Current GI bleed   Endo/Other  diabetes  Renal/GU      Musculoskeletal  (+) Arthritis ,   Abdominal   Peds  Hematology negative hematology ROS (+) anemia , GI bleed, Hgb 8.5   Anesthesia Other Findings Past Medical History: No date: Anemia     Comment:  TAKES IRON TAB No date: Arthritis     Comment:  SHOULDER No date: Bleeding     Comment:  GI  3/19 No date: Bronchitis     Comment:  HX OF No date: Cirrhosis ([place]) No date: Diabetes mellitus without complication ([facility])     Comment:  TYPE 2 No date: Fatty (change of) liver, not elsewhere classified No date: Full dentures     Comment:  UPPER AND LOWER No date: Lung nodule, multiple  Past Surgical History: 02/06/2018: CATARACT EXTRACTION W/PHACO; Right     Comment:  Procedure: CATARACT EXTRACTION PHACO AND INTRAOCULAR               LENS PLACEMENT ([place]) RIGHT DIABETIC;  Surgeon:               Leandrew Koyanagi, MD;  Location: [place];              Service: Ophthalmology;  Laterality: Right;  DIABETIC,               ORAL MED 04/30/2018: CATARACT EXTRACTION W/PHACO; Left     Comment:  Procedure: CATARACT EXTRACTION PHACO AND INTRAOCULAR               LENS PLACEMENT (IOC);  Surgeon: Leandrew Koyanagi, MD;              Location: [facility] ORS;   Service: Ophthalmology;  Laterality:              Left;  [place] 01:04 AP% 21.3 CDE 13.66 Fluid pack lot #               [id number] 03/25/2018: COLONOSCOPY WITH PROPOFOL; N/A     Comment:  Procedure: COLONOSCOPY WITH PROPOFOL;  Surgeon: Lin Landsman, MD;  Location: [facility] ENDOSCOPY;  Service:               Gastroenterology;  Laterality: N/A; No date: DENTAL SURGERY     Comment:  EXTRACTIONS 02/10/2018: ESOPHAGOGASTRODUODENOSCOPY (EGD) WITH PROPOFOL; N/A     Comment:  Procedure: ESOPHAGOGASTRODUODENOSCOPY (EGD) WITH               PROPOFOL;  Surgeon: Jonathon Bellows, MD;  Location: [facility]               ENDOSCOPY;  Service: Gastroenterology;  Laterality: N/A; 03/25/2018: ESOPHAGOGASTRODUODENOSCOPY (EGD) WITH PROPOFOL; N/A     Comment:  Procedure: ESOPHAGOGASTRODUODENOSCOPY (EGD) WITH  PROPOFOL;  Surgeon: Lin Landsman, MD;  Location:               [facility] ENDOSCOPY;  Service: Gastroenterology;  Laterality:               N/A; 04/22/2018: ESOPHAGOGASTRODUODENOSCOPY (EGD) WITH PROPOFOL; N/A     Comment:  Procedure: ESOPHAGOGASTRODUODENOSCOPY (EGD) WITH               PROPOFOL with band ligation;  Surgeon: Lin Landsman, MD;  Location: [place];  Service:               Gastroenterology;  Laterality: N/A; 06/24/2018: ESOPHAGOGASTRODUODENOSCOPY (EGD) WITH PROPOFOL; N/A     Comment:  Procedure: ESOPHAGOGASTRODUODENOSCOPY (EGD) WITH               PROPOFOL;  Surgeon: Lin Landsman, MD;  Location:               [facility] ENDOSCOPY;  Service: Gastroenterology;  Laterality:               N/A; No date: EYE SURGERY 03/25/2018: HEMORRHOID BANDING     Comment:  Procedure: HEMORRHOID BANDING;  Surgeon: Lin Landsman, MD;  Location: [facility] ENDOSCOPY;  Service:               Gastroenterology;;  BMI    Body Mass Index:  31.71 kg/m      Reproductive/Obstetrics negative OB ROS                           Anesthesia  Physical Anesthesia Plan  ASA: IV  Anesthesia Plan: General ETT   Post-op Pain Management:    Induction:   PONV Risk Score and Plan:   Airway Management Planned: Natural Airway and Nasal Cannula  Additional Equipment:   Intra-op Plan:   Post-operative Plan:   Informed Consent: I have reviewed the patients History and Physical, chart, labs and discussed the procedure including the risks, benefits and alternatives for the proposed anesthesia with the patient or authorized representative who has indicated his/her understanding and acceptance.   Dental Advisory Given  Plan Discussed with: Anesthesiologist, CRNA and Surgeon  Anesthesia Plan Comments: (Per Dr. Marius Ditch, last vomiting episode was 11:30pm last night, low suspicion for active bleeding in the stomach at this time.  No ascites.  Dr. Marius Ditch to take a quick look and will reassess need for intubation at that time.)       Anesthesia Quick Evaluation

## 2018-08-03 NOTE — Op Note (Signed)
[facility] Patient Name: Crystal [name] Procedure Date: 08/03/2018 7:32 AM MRN: [id number] Account #: [id number] Date of Birth: 17-Jul-1951 Admit Type: Inpatient Age: 67 Room: [facility] ENDO ROOM 4 Gender: Female Note Status: Finalized Procedure:            Upper GI endoscopy Indications:          Acute post hemorrhagic anemia, Hematemesis, Active                        gastrointestinal bleeding, For therapy of esophageal                        varices with bleeding Providers:            Lin Landsman MD, MD Referring MD:         Lorelee Market (Referring MD) Complications:        No immediate complications. Estimated blood loss:                        Moderate Procedure:            Pre-Anesthesia Assessment:                       - Prior to the procedure, a History and Physical was                        performed, and patient medications and allergies were                        reviewed. The patient is competent. The risks and                        benefits of the procedure and the sedation options and                        risks were discussed with the patient. All questions                        were answered and informed consent was obtained.                        Patient identification and proposed procedure were                        verified by the physician, the nurse, the                        anesthesiologist, the anesthetist and the technician in                        the pre-procedure area in the procedure room in the                        endoscopy suite. Mental Status Examination: alert and                        oriented. Airway Examination: normal oropharyngeal                        airway and neck mobility. Respiratory  Examination:                        clear to auscultation. CV Examination: normal.                        Prophylactic Antibiotics: The patient does not require                        prophylactic  antibiotics. Prior Anticoagulants: The                        patient has taken no previous anticoagulant or                        antiplatelet agents. ASA Grade Assessment: IV - A                        patient with severe systemic disease that is a constant                        threat to life. After reviewing the risks and benefits,                        the patient was deemed in satisfactory condition to                        undergo the procedure. The anesthesia plan was to use                        general anesthesia. Immediately prior to administration                        of medications, the patient was re-assessed for                        adequacy to receive sedatives. The heart rate,                        respiratory rate, oxygen saturations, blood pressure,                        adequacy of pulmonary ventilation, and response to care                        were monitored throughout the procedure. The physical                        status of the patient was re-assessed after the                        procedure.                       After obtaining informed consent, the endoscope was                        passed under direct vision. Throughout the procedure,                        the patient's blood pressure, pulse, and oxygen  saturations were monitored continuously. The Endoscope                        was introduced through the mouth, and advanced to the                        second part of duodenum. The upper GI endoscopy was                        accomplished without difficulty. The patient tolerated                        the procedure well. Findings:      The duodenal bulb and second portion of the duodenum were normal.      Moderate portal hypertensive gastropathy was found in the entire       examined stomach.      Three columns of large (> 5 mm) varices were found in the lower third of       the esophagus, 35 cm from the incisors.  Stigmata of recent bleeding were       evident and red wale signs were present. Scarring from prior treatment       was visible. Evidence of partial eradication was visible. One varix       started bleeding actively during the procedure. Banding was not       successful due to extensive scarring, hemospray was performed, there was       no bleeding at the end of procedure, Estimated blood loss: ~75 mL. Impression:           - Normal duodenal bulb and second portion of the                        duodenum.                       - Portal hypertensive gastropathy.                       - Bleeding large (> 5 mm) esophageal varices.                       - No specimens collected. Recommendation:       - Return patient to ICU for ongoing care.                       - NPO.                       - Consult IR and transfer to [facility] for                        urgent TIPS due to refractory variceal bleed                       - Continue octreotide and PPI drips                       - Rocephin for SBP prophylaxis                       - Serial H/H Procedure Code(s):    --- Professional ---  97026, Esophagogastroduodenoscopy, flexible, transoral;                        diagnostic, including collection of specimen(s) by                        brushing or washing, when performed (separate procedure) Diagnosis Code(s):    --- Professional ---                       K76.6, Portal hypertension                       K31.89, Other diseases of stomach and duodenum                       I85.01, Esophageal varices with bleeding                       D62, Acute posthemorrhagic anemia                       K92.0, Hematemesis                       K92.2, Gastrointestinal hemorrhage, unspecified CPT copyright 2017 American Medical Association. All rights reserved. The codes documented in this report are preliminary and upon coder review may  be revised to meet current compliance  requirements. Dr. Ulyess Mort Lin Landsman MD, MD 08/03/2018 8:35:13 AM This report has been signed electronically. Number of Addenda: 0 Note Initiated On: 08/03/2018 7:32 AM      [facility]

## 2018-08-04 ENCOUNTER — Inpatient Hospital Stay (HOSPITAL_COMMUNITY): Payer: Medicare HMO

## 2018-08-04 ENCOUNTER — Encounter (HOSPITAL_COMMUNITY): Payer: Self-pay | Admitting: *Deleted

## 2018-08-04 ENCOUNTER — Other Ambulatory Visit: Payer: Self-pay | Admitting: Gastroenterology

## 2018-08-04 DIAGNOSIS — R188 Other ascites: Secondary | ICD-10-CM

## 2018-08-04 DIAGNOSIS — I8511 Secondary esophageal varices with bleeding: Secondary | ICD-10-CM

## 2018-08-04 DIAGNOSIS — K7460 Unspecified cirrhosis of liver: Secondary | ICD-10-CM

## 2018-08-04 DIAGNOSIS — D500 Iron deficiency anemia secondary to blood loss (chronic): Secondary | ICD-10-CM

## 2018-08-04 LAB — BASIC METABOLIC PANEL
Anion gap: 10 (ref 5–15)
BUN: 25 mg/dL — ABNORMAL HIGH (ref 8–23)
CO2: 18 mmol/L — ABNORMAL LOW (ref 22–32)
Calcium: 8.1 mg/dL — ABNORMAL LOW (ref 8.9–10.3)
Chloride: 110 mmol/L (ref 98–111)
Creatinine, Ser: 0.89 mg/dL (ref 0.44–1.00)
GFR calc Af Amer: >60 mL/min (ref >60)
GFR calc non Af Amer: >60 mL/min (ref >60)
Glucose, Bld: 184 mg/dL — ABNORMAL HIGH (ref 70–99)
Potassium: 4.1 mmol/L (ref 3.5–5.1)
Sodium: 138 mmol/L (ref 135–145)

## 2018-08-04 LAB — BPAM RBC
Blood Product Expiration Date: 201910182359
Blood Product Expiration Date: 201910182359
Blood Product Expiration Date: 201910182359
Blood Product Expiration Date: 201910182359
ISSUE DATE / TIME: 201909211227
Unit Type and Rh: 5100
Unit Type and Rh: 5100
Unit Type and Rh: 5100
Unit Type and Rh: 5100

## 2018-08-04 LAB — TYPE AND SCREEN
ABO/RH(D): O POS
Antibody Screen: NEGATIVE
Unit division: 0
Unit division: 0
Unit division: 0
Unit division: 0

## 2018-08-04 LAB — CBC
HCT: 25.6 % — ABNORMAL LOW (ref 36.0–46.0)
HCT: 24.3 % — ABNORMAL LOW (ref 36.0–46.0)
Hemoglobin: 8.1 g/dL — ABNORMAL LOW (ref 12.0–15.0)
Hemoglobin: 7.5 g/dL — ABNORMAL LOW (ref 12.0–15.0)
MCH: 27.3 pg (ref 26.0–34.0)
MCH: 26.2 pg (ref 26.0–34.0)
MCHC: 31.6 g/dL (ref 30.0–36.0)
MCHC: 30.9 g/dL (ref 30.0–36.0)
MCV: 86.2 fL (ref 78.0–100.0)
MCV: 85 fL (ref 78.0–100.0)
Platelets: 121 10*3/uL — ABNORMAL LOW (ref 150–400)
Platelets: 131 10*3/uL — ABNORMAL LOW (ref 150–400)
RBC: 2.97 MIL/uL — ABNORMAL LOW (ref 3.87–5.11)
RBC: 2.86 MIL/uL — ABNORMAL LOW (ref 3.87–5.11)
RDW: 18.4 % — ABNORMAL HIGH (ref 11.5–15.5)
RDW: 18.6 % — ABNORMAL HIGH (ref 11.5–15.5)
WBC: 2.8 10*3/uL — ABNORMAL LOW (ref 4.0–10.5)
WBC: 4.1 10*3/uL (ref 4.0–10.5)

## 2018-08-04 LAB — HCV RNA QUANT: HCV Quantitative: NOT DETECTED IU/mL (ref >50)

## 2018-08-04 LAB — CBC WITH DIFFERENTIAL/PLATELET
Abs Immature Granulocytes: 0 10*3/uL (ref 0.0–0.1)
Basophils Absolute: 0 10*3/uL (ref 0.0–0.1)
Basophils Relative: 1 %
Eosinophils Absolute: 0 10*3/uL (ref 0.0–0.7)
Eosinophils Relative: 1 %
HCT: 25.5 % — ABNORMAL LOW (ref 36.0–46.0)
Hemoglobin: 7.8 g/dL — ABNORMAL LOW (ref 12.0–15.0)
Immature Granulocytes: 0 %
Lymphocytes Relative: 34 %
Lymphs Abs: 1.5 10*3/uL (ref 0.7–4.0)
MCH: 26.6 pg (ref 26.0–34.0)
MCHC: 30.6 g/dL (ref 30.0–36.0)
MCV: 87 fL (ref 78.0–100.0)
Monocytes Absolute: 0.4 10*3/uL (ref 0.1–1.0)
Monocytes Relative: 10 %
Neutro Abs: 2.3 10*3/uL (ref 1.7–7.7)
Neutrophils Relative %: 54 %
Platelets: 112 10*3/uL — ABNORMAL LOW (ref 150–400)
RBC: 2.93 MIL/uL — ABNORMAL LOW (ref 3.87–5.11)
RDW: 19.3 % — ABNORMAL HIGH (ref 11.5–15.5)
WBC: 4.3 10*3/uL (ref 4.0–10.5)

## 2018-08-04 LAB — PREPARE RBC (CROSSMATCH)

## 2018-08-04 LAB — TRIGLYCERIDES: Triglycerides: 163 mg/dL — ABNORMAL HIGH (ref <150)

## 2018-08-04 LAB — ABO/RH: ABO/RH(D): O POS

## 2018-08-04 MED ORDER — INFLUENZA VAC SPLIT HIGH-DOSE 0.5 ML IM SUSY
0.5000 mL | PREFILLED_SYRINGE | INTRAMUSCULAR | Status: AC
  Administered 2018-08-05: 0.5 mL via INTRAMUSCULAR
  Filled 2018-08-04 (×2): qty 0.5

## 2018-08-04 MED ORDER — PNEUMOCOCCAL VAC POLYVALENT 25 MCG/0.5ML IJ INJ
0.5000 mL | INJECTION | INTRAMUSCULAR | Status: AC
  Administered 2018-08-05: 0.5 mL via INTRAMUSCULAR
  Filled 2018-08-04: qty 0.5

## 2018-08-04 MED ORDER — SODIUM CHLORIDE 0.9 % IV SOLN
750.0000 mg | Freq: Once | INTRAVENOUS | Status: AC
  Administered 2018-08-05: 750 mg via INTRAVENOUS
  Filled 2018-08-04: qty 15

## 2018-08-04 MED ORDER — PANTOPRAZOLE SODIUM 40 MG IV SOLR
40.0000 mg | Freq: Two times a day (BID) | INTRAVENOUS | Status: DC
  Administered 2018-08-07 – 2018-08-08 (×2): 40 mg via INTRAVENOUS
  Filled 2018-08-04 (×2): qty 40

## 2018-08-04 MED ORDER — PROPRANOLOL HCL 10 MG PO TABS
20.0000 mg | ORAL_TABLET | Freq: Two times a day (BID) | ORAL | Status: DC
  Administered 2018-08-05 – 2018-08-07 (×6): 20 mg via ORAL
  Filled 2018-08-04 (×7): qty 2

## 2018-08-04 NOTE — Progress Notes (Addendum)
Patient ID: Crystal [name], female   DOB: [birth year], 67 y.o.   MRN: [id number] Patient extubated; sitting up in chair ;daughter in room; no further visible bleeding noted; no BM's; denies abd pain/n/v BP 94/52, heart rate 71 Creatinine 0.89, hemoglobin 7.5 (8.1), plts 131k Cont to monitor ; await GI f/u; will plan to work towards poss scheduling of TIPS next week with anesthesia; Dr. Kathlene Cote to discuss further with pt/daughter

## 2018-08-04 NOTE — Consult Note (Signed)
[facility] CON[facility] Reason for consult: Variceal bleeding Referring Physician: Critical care medicine.  PCP: Dr. Brunetta Genera.  Primary GI: Dr. Sherri Sear  Crystal [name] is an 67 y.o. female.  HPI: She has been followed by Dr. Marius Ditch for Karlene Lineman due to metabolic syndrome.  She has had several prior variceal bleeds and undergone prior banding of esophageal varices.  Yesterday she came into [facility] with an acute GI bleed.  She was started on antibiotics and octreotide.  Her prior EGD 8/19 for repeat banding reveals severe scarring of the distal esophagus no banding was performed.  She has been on prophylaxis with propranolol as an outpatient.  Due to her acute bleeding she underwent EGD by Dr. Marius Ditch yesterday showing actively bleeding varix of the esophagus that was surrounded by scarring and they were unable to place a band..  This was treated with Hemospray spray.  With hemospray and octreotide infusion she has quit bleeding.  She was transferred to [facility] for possible emergency TIPS but this was not done emergently due to the fact that she was stable.  She has been extubated moved to the floor.  Since that time she has had no further bleeding and we have discussed her case with Dr. Kathlene Cote of IR.The issue at this point is whether or not to attempt further banding of the single remaining varix or to proceed with elective TIPS.  Past Medical History:  Diagnosis Date  . Anemia    TAKES IRON TAB  . Arthritis    SHOULDER  . Bleeding    GI  3/19  . Bronchitis    HX OF  . Cirrhosis ([place])   . Diabetes mellitus without complication ([place])    TYPE 2  . Fatty (change of) liver, not elsewhere classified   . Full dentures    UPPER AND LOWER  . Lung nodule, multiple     Past Surgical History:  Procedure Laterality Date  . CATARACT EXTRACTION W/PHACO Right 02/06/2018   Procedure: CATARACT EXTRACTION PHACO AND INTRAOCULAR LENS PLACEMENT ([place]) RIGHT DIABETIC;  Surgeon: Leandrew Koyanagi, MD;  Location: [place];  Service: Ophthalmology;  Laterality: Right;  DIABETIC, ORAL MED  . CATARACT EXTRACTION W/PHACO Left 04/30/2018   Procedure: CATARACT EXTRACTION PHACO AND INTRAOCULAR LENS PLACEMENT ([facility]);  Surgeon: Leandrew Koyanagi, MD;  Location: [facility] ORS;  Service: Ophthalmology;  Laterality: Left;  [place] 01:04 AP% 21.3 CDE 13.66 Fluid pack lot # [id number]  . COLONOSCOPY WITH PROPOFOL N/A 03/25/2018   Procedure: COLONOSCOPY WITH PROPOFOL;  Surgeon: Lin Landsman, MD;  Location[facility] ENDOSCOPY;  Service: Gastroenterology;  Laterality: N/A;  . DENTAL SURGERY     EXTRACTIONS  . ESOPHAGOGASTRODUODENOSCOPY (EGD) WITH PROPOFOL N/A 02/10/2018   Procedure: ESOPHAGOGASTRODUODENOSCOPY (EGD) WITH PROPOFOL;  Surgeon: Jonathon Bellows, MD;  Location: [facility] ENDOSCOPY;  Service: Gastroenterology;  Laterality: N/A;  . ESOPHAGOGASTRODUODENOSCOPY (EGD) WITH PROPOFOL N/A 03/25/2018   Procedure: ESOPHAGOGASTRODUODENOSCOPY (EGD) WITH PROPOFOL;  Surgeon: Lin Landsman, MD;  Location: [facility] ENDOSCOPY;  Service: Gastroenterology;  Laterality: N/A;  . ESOPHAGOGASTRODUODENOSCOPY (EGD) WITH PROPOFOL N/A 04/22/2018   Procedure: ESOPHAGOGASTRODUODENOSCOPY (EGD) WITH PROPOFOL with band ligation;  Surgeon: Lin Landsman, MD;  Location: [place];  Service: Gastroenterology;  Laterality: N/A;  . ESOPHAGOGASTRODUODENOSCOPY (EGD) WITH PROPOFOL N/A 06/24/2018   Procedure: ESOPHAGOGASTRODUODENOSCOPY (EGD) WITH PROPOFOL;  Surgeon: Lin Landsman, MD;  Location: [facility] ENDOSCOPY;  Service: Gastroenterology;  Laterality: N/A;  . EYE SURGERY    . HEMORRHOID BANDING  03/25/2018   Procedure: HEMORRHOID BANDING;  Surgeon: Lin Landsman, MD;  Location: [facility] ENDOSCOPY;  Service: Gastroenterology;;    Family History  Problem Relation Age of Onset  . CAD Father   . Cancer Maternal Uncle     Social History:  reports that she has never smoked. She has never used smokeless tobacco. She  reports that she does not drink alcohol or use drugs.  Allergies: No Known Allergies  Medications; Prior to Admission medications   Medication Sig Start Date End Date Taking? Authorizing Provider  cyanocobalamin (,VITAMIN B-12,) 1000 MCG/ML injection Inject 1,000 mcg into the muscle once.   Yes [provider]  docusate sodium (COLACE) 100 MG capsule Take 100 mg by mouth daily.   Yes [provider]  furosemide (LASIX) 20 MG tablet Take 2 tablets (40 mg total) by mouth daily. 05/24/18 11/20/18 Yes Vanga, Tally Due, MD  metFORMIN (GLUCOPHAGE) 500 MG tablet Take 1,000 mg by mouth 2 (two) times daily with a meal.    Yes [provider]  nystatin (MYCOSTATIN/NYSTOP) powder Apply 1 g topically as needed (rash).  05/23/18  Yes [provider]  omeprazole (PRILOSEC) 40 MG capsule Take 1 capsule (40 mg total) by mouth 2 (two) times daily before a meal. 05/24/18 11/20/18 Yes Vanga, Tally Due, MD  ondansetron (ZOFRAN) 4 MG tablet Take 1 tablet (4 mg total) by mouth every 6 (six) hours as needed for nausea. 08/03/18  Yes Rosine Door, MD  propranolol (INDERAL) 20 MG tablet Take 1 tablet (20 mg total) by mouth 2 (two) times daily. 02/14/18 08/13/18 Yes Vanga, Tally Due, MD  spironolactone (ALDACTONE) 50 MG tablet Take 2 tablets (100 mg total) by mouth daily. 05/24/18 11/20/18 Yes Lin Landsman, MD   . Derrill Memo ON 08/05/2018] Influenza vac split quadrivalent PF  0.5 mL Intramuscular Tomorrow-1000  . [START ON 08/05/2018] pneumococcal 23 valent vaccine  0.5 mL Intramuscular Tomorrow-1000   PRN Meds sodium chloride Results for orders placed or performed during the hospital encounter of 08/03/18 (from the past 48 hour(s))  Glucose, capillary     Status: Abnormal   Collection Time: 08/03/18  3:28 PM  Result Value Ref Range   Glucose-Capillary 159 (H) 70 - 99 mg/dL   Comment 1 Capillary Specimen    Comment 2 Notify RN   Type and screen [place]      Status: None   Collection Time: 08/03/18  9:52 PM  Result Value Ref Range   ABO/RH(D) O POS    Antibody Screen NEG    Sample Expiration      08/06/2018 Performed at [facility], [place] [address], [place], [place] [address]   CBC     Status: Abnormal   Collection Time: 08/03/18  9:52 PM  Result Value Ref Range   WBC 2.8 (L) 4.0 - 10.5 K/uL   RBC 2.97 (L) 3.87 - 5.11 MIL/uL   Hemoglobin 8.1 (L) 12.0 - 15.0 g/dL   HCT 25.6 (L) 36.0 - 46.0 %   MCV 86.2 78.0 - 100.0 fL   MCH 27.3 26.0 - 34.0 pg   MCHC 31.6 30.0 - 36.0 g/dL   RDW 18.4 (H) 11.5 - 15.5 %   Platelets 121 (L) 150 - 400 K/uL    Comment: Performed at [facility], [place] [address], [place], [place] [address]  Triglycerides     Status: Abnormal   Collection Time: 08/03/18  9:52 PM  Result Value Ref Range   Triglycerides 163 (H) <150 mg/dL  Comment: Performed at [facility], [place] [address] [place], [place] [address]  ABO/Rh     Status: None   Collection Time: 08/03/18  9:52 PM  Result Value Ref Range   ABO/RH(D)      O POS Performed at [facility] [address], [place], [place] [address]   CBC     Status: Abnormal   Collection Time: 08/04/18  2:42 AM  Result Value Ref Range   WBC 4.1 4.0 - 10.5 K/uL   RBC 2.86 (L) 3.87 - 5.11 MIL/uL   Hemoglobin 7.5 (L) 12.0 - 15.0 g/dL   HCT 24.3 (L) 36.0 - 46.0 %   MCV 85.0 78.0 - 100.0 fL   MCH 26.2 26.0 - 34.0 pg   MCHC 30.9 30.0 - 36.0 g/dL   RDW 18.6 (H) 11.5 - 15.5 %   Platelets 131 (L) 150 - 400 K/uL    Comment: Performed at [facility], [place] [address] [place], [place] [address]  Basic metabolic panel     Status: Abnormal   Collection Time: 08/04/18  2:42 AM  Result Value Ref Range   Sodium 138 135 - 145 mmol/L   Potassium 4.1 3.5 - 5.1 mmol/L   Chloride 110 98 - 111 mmol/L   CO2 18 (L) 22 - 32 mmol/L   Glucose, Bld 184 (H) 70 - 99 mg/dL   BUN 25 (H) 8 - 23 mg/dL   Creatinine, Ser 0.89 0.44 - 1.00 mg/dL   Calcium 8.1 (L) 8.9 -  10.3 mg/dL   GFR calc non Af Amer >60 >60 mL/min   GFR calc Af Amer >60 >60 mL/min    Comment: (NOTE) The eGFR has been calculated using the CKD EPI equation. This calculation has not been validated in all clinical situations. eGFR's persistently <60 mL/min signify possible Chronic Kidney Disease.    Anion gap 10 5 - 15    Comment: Performed at [facility] [address], [place], [place] [address]    Dg Chest Port 1 View  Result Date: 08/04/2018 CLINICAL DATA:  Respiratory failure EXAM: PORTABLE CHEST 1 VIEW COMPARISON:  CT chest dated 05/15/2018. FINDINGS: Right infrahilar nodular opacity. When correlating with prior CT, this reflects distinct right middle and lower lobe nodules, suspicious for neoplasm. Left lung is clear.  No pleural effusion or pneumothorax. The heart is top-normal in size. IMPRESSION: Right infrahilar nodular opacity. When correlating with prior CT, this reflects distinct right middle and lower lobe nodules, suspicious for neoplasm. No evidence of acute cardiopulmonary disease. Electronically Signed   By: Julian Hy M.D.   On: 08/04/2018 07:50   US Abdomen Limited Ruq  Result Date: 08/03/2018 CLINICAL DATA:  Cirrhosis. EXAM: ULTRASOUND ABDOMEN LIMITED RIGHT UPPER QUADRANT COMPARISON:  Ultrasound dated 05/29/2018 and PET-CT dated 03/08/2018 and CT scan of the abdomen dated 02/09/2018 FINDINGS: Gallbladder: At least 2 multiple gallstones are present. There is gallbladder wall thickening to 8 mm. There is pericholecystic fluid. The appearance is essentially unchanged since the prior exam except for the pericholecystic fluid, which is minimal. Common bile duct: Diameter: 5.5 mm, normal. Liver: No focal lesions. Coarse echotexture of the liver parenchyma with nodularity of the surface of the liver consistent with cirrhosis. Portal vein is patent on color Doppler imaging with normal direction of blood flow towards the liver. The portal vein is distended to 17.4 mm.  Slight ascites around the liver. IMPRESSION: 1. Dilated portal vein. However, there is normal flow  in the portal vein. 2. Cirrhosis. 3. Chronic cholecystitis with chronic thickening of the gallbladder wall. 4. Small amount of ascites including around the gallbladder. Electronically Signed   By: Lorriane Shire M.D.   On: 08/03/2018 12:12               Blood pressure (!) 116/53, pulse 69, temperature 98.4 F (36.9 C), temperature source Oral, resp. rate 16, height 5' 3" (1.6 m), weight 82.2 kg, SpO2 99 %.  Physical exam:   General--white female sitting up in bed watching television eating a clear liquid diet ENT--nonicteric Neck--without lymphadenopathy Heart--regular rate and rhythm without murmurs or gallops  Lungs--clear Abdomen--soft nontender no ascites noted normal bowel sounds Psych--alert and oriented answers and ask questions appropriately  Assessment: 1.  Chronic variceal bleeding.  Patient has had multiple banding procedures and her entire esophagus is scarred with one area which varix can protrude and bleed.  Her entire family including eldest daughter is in the room and I discussed the options of repeated EGD with attempt to band that varix or going ahead with an elective TIPS.  This is been discussed in detail with IR and the feeling that I have as well as IR is that she will need a TIPS and it would be in her best interest to have this done when she is stable rather than acutely bleeding.  The pros and cons of this were discussed with the family including the long-term problems with tips such as encephalopathy etc. 2.  Cirrhosis of liver secondary to NASH 3.  Type 2 diabetes with metabolic syndrome  Plan: The patient, her daughter, and family will consider elective TIPS.  She is stable on octreotide currently.  I would recommend keeping her on clear liquids until decision is made in case she rebleeds and needs another EGD with attempted banding.  IR is aware and following  her and will discuss elective TIPS with her again.   Nancy Fetter 08/04/2018, 3:26 PM   This note was created using voice recognition software and minor errors may Have occurred unintentionally. Pager: [phone] If no answer or after hours call [phone]

## 2018-08-04 NOTE — Progress Notes (Addendum)
NAME:  Crystal [name], MRN:  [id number], DOB:  Sep 22, 1951, LOS: 1 ADMISSION DATE:  08/03/2018, CONSULTATION DATE: 08/03/2018 REFERRING MD:  [place] CCM, CHIEF COMPLAINT: Hematemesis  Brief History   67 year old woman with NASH cirrhosis secondary to metabolic syndrome and prior variceal bleeding.  Presented yesterday to [facility] with hematemesis and hematochezia.  She underwent upper endoscopy where she was found to have portal hypertensive gastropathy and bleeding from a large 5 mm esophageal varix.  Bleeding was controlled with hemospermia however variceal banding was unsuccessful due to scarring from previous banding procedures.  She was transferred to [facility] for consideration of TIPS procedure.  Significant Hospital Events   On arrival she was found to be hemostatically stable with no clinical evidence of bleeding.  IR has thus deferred intervention for now.  Consults: date of consult/date signed off & final recs:  Interventional radiology.  Subjective:  Patient is now extubated and denies abdominal pain or nausea. Has not passed blood from rectum. Eager to eat.   Objective   Blood pressure (!) 94/48, pulse 75, temperature 98 F (36.7 C), temperature source Oral, resp. rate 13, height 5' 3" (1.6 m), weight 82.2 kg, SpO2 99 %.    Vent Mode: PRVC FiO2 (%):  [30 %] 30 % Set Rate:  [12 bmp] 12 bmp Vt Set:  [450 mL] 450 mL PEEP:  [5 cmH20] 5 cmH20 Plateau Pressure:  [13 cmH20] 13 cmH20   Intake/Output Summary (Last 24 hours) at 08/04/2018 0258 Last data filed at 08/04/2018 0800 Gross per 24 hour  Intake 742.67 ml  Output 2085 ml  Net -1342.33 ml   Filed Weights   08/03/18 1400 08/03/18 2141 08/04/18 0446  Weight: 82.3 kg 82.3 kg 82.2 kg    Examination: General: Chronically ill-appearing woman who appears older than her stated age.  Generalized pallor. HENT:  Scleral pallor but no scleral icterus.  Spider nevi on face. Lungs: Chest clear Cardiovascular: Extremities  warm well perfused.  No JVD.  No edema.  Heart sounds unremarkable. Abdomen: Soft nontender not distended no flank dullness.  No caput. Extremities: IV sites intact.  R arm swollen from BP cuff and IV fluids. Neuro: Moves all limbs to pain.  Fully communicative. No asterixis GU: Foley catheter in place with adequate urine output.  Resolved Hospital Problem list    Assessment & Plan:   Acute variceal upper GI bleed which appears to have stopped with endoscopic management.  Interventional radiology has deferred further intervention. Clinically no signs of recurrent bleeding, but will monitor mild drift in hemoglobin IV pantoprazole bid Continue octreotide for total of 5 days. Clear diet GI to see regarding options for definitive management.  Acute blood loss anemia.  Monitor HB. IV iron to replete stores.  Acute respiratory failure due to periprocedural sedation.  Now extubated and on room air.  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  MELD score of 10.  Currently no other features of decompensation apart from GI bleed which appears to have resolved.  No history or signs of hepatic encephalopathy at this time.    Type 2 diabetes and metabolic syndrome.  Currently n.p.o.  Will initiate correction sliding scale once extubated and begins taking oral intake.  Can then transition to usual home diabetic regimen.  Disposition / Summary of Today's Plan 08/04/18   Transfer to [facility], GI consultation.  Spoke with Dr Oletta Lamas and Candiss Norse.    Diet: Clears Pain/Anxiety/Delirium protocol (if indicated): none VAP protocol (if indicated) currently  in place. DVT prophylaxis: SCDs only due to recent GI bleed GI prophylaxis: On pantoprazole  Hyperglycemia protocol: Correction sliding scale only Mobility: Progressive ambulation once extubated Code Status: Full code Family Communication: Daughter is updated.  Frustration at the patient's seeming lack of progress.  Will obtain an elective GI  consult tomorrow for ongoing management of her chronic liver disease.  Labs   CBC: mild hemoglobin drift. Stable thrombocytopenia Recent Labs  Lab 08/02/18 2212 08/03/18 0341 08/03/18 0548 08/03/18 0932 08/03/18 2152 08/04/18 0242  WBC 3.2* 7.0  --   --  2.8* 4.1  HGB 9.0* 9.0* 8.5* 8.3* 8.1* 7.5*  HCT 27.0* 27.0* 25.1*  --  25.6* 24.3*  MCV 80.9 80.6  --   --  86.2 85.0  PLT 148* 198  --   --  121* 161*   Basic Metabolic Panel: Recent Labs  Lab 08/02/18 2212 08/03/18 0341 08/03/18 0548 08/04/18 0242  NA 137 137  --  138  K 3.8 4.4  --  4.1  CL 104 106  --  110  CO2 24 22  --  18*  GLUCOSE 137* 145*  --  184*  BUN 19 22  --  25*  CREATININE 0.72 0.69  --  0.89  CALCIUM 9.3 9.1  --  8.1*  MG  --   --  1.6*  --   PHOS  --   --  2.8  --    GFR: Estimated Creatinine Clearance: 63.1 mL/min (by C-G formula based on SCr of 0.89 mg/dL). Recent Labs  Lab 08/02/18 2212 08/03/18 0341 08/03/18 2152 08/04/18 0242  WBC 3.2* 7.0 2.8* 4.1  LATICACIDVEN 2.0*  --   --   --    Liver Function Tests: Mild transaminitis Recent Labs  Lab 08/02/18 2212  AST 105*  ALT 79*  ALKPHOS 216*  BILITOT 0.7  PROT 7.1  ALBUMIN 3.0*   Coagulation Profile: normal synthetic function Recent Labs  Lab 08/02/18 2212 08/03/18 0548  INR 1.09 1.16    CBG: adequate glycemic control Recent Labs  Lab 08/03/18 1158 08/03/18 [place], MD [facility] ICU Physician [place]  Pager: [phone] Mobile: [phone] After hours: [phone].

## 2018-08-04 NOTE — H&P (Deleted)
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  Presented yesterday to [facility] with hematemesis and hematochezia.  She underwent upper endoscopy where she was found to have portal hypertensive gastropathy and bleeding from a large 5 mm esophageal varix.  Bleeding was controlled with hemospermia however variceal banding was unsuccessful due to scarring from previous banding procedures.  She was transferred to [facility] for consideration of TIPS procedure.  Significant Hospital Events   On arrival she was found to be hemostatically stable with no clinical evidence of bleeding.  IR has thus deferred intervention for now.  Consults: date of consult/date signed off & final recs:  Interventional radiology.  Subjective:  Patient is now extubated and denies abdominal pain or nausea. Has not passed blood from rectum. Eager to eat.   Objective   Blood pressure (!) 94/48, pulse 75, temperature 98 F (36.7 C), temperature source Oral, resp. rate 13, height 5' 3" (1.6 m), weight 82.2 kg, SpO2 99 %.    Vent Mode: PRVC FiO2 (%):  [30 %] 30 % Set Rate:  [12 bmp] 12 bmp Vt Set:  [450 mL] 450 mL PEEP:  [5 cmH20] 5 cmH20 Plateau Pressure:  [13 cmH20] 13 cmH20   Intake/Output Summary (Last 24 hours) at 08/04/2018 5852 Last data filed at 08/04/2018 0800 Gross per 24 hour  Intake 742.67 ml  Output 2085 ml  Net -1342.33 ml   Filed Weights   08/03/18 1400 08/03/18 2141 08/04/18 0446  Weight: 82.3 kg 82.3 kg 82.2 kg    Examination: General: Chronically ill-appearing woman who appears older than her stated age.  Generalized pallor. HENT:  Scleral pallor but no scleral icterus.  Spider nevi on face. Lungs: Chest clear Cardiovascular: Extremities  warm well perfused.  No JVD.  No edema.  Heart sounds unremarkable. Abdomen: Soft nontender not distended no flank dullness.  No caput. Extremities: IV sites intact.  R arm swollen from BP cuff and IV fluids. Neuro: Moves all limbs to pain.  Fully communicative. No asterixis GU: Foley catheter in place with adequate urine output.  Resolved Hospital Problem list    Assessment & Plan:   Acute variceal upper GI bleed which appears to have stopped with endoscopic management.  Interventional radiology has deferred further intervention. Clinically no signs of recurrent bleeding, but will monitor mild drift in hemoglobin IV pantoprazole bid Continue octreotide for total of 5 days. Clear diet GI to see regarding options for definitive management.  Acute blood loss anemia.  Monitor HB. IV iron to replete stores.  Acute respiratory failure due to periprocedural sedation.  Now extubated and on room air.  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  MELD score of 10.  Currently no other features of decompensation apart from GI bleed which appears to have resolved.  No history or signs of hepatic encephalopathy at this time.    Type 2 diabetes and metabolic syndrome.  Currently n.p.o.  Will initiate correction sliding scale once extubated and begins taking oral intake.  Can then transition to usual home diabetic regimen.  Disposition / Summary of Today's Plan 08/04/18   Transfer to [facility], GI consultation.    Diet: Clears Pain/Anxiety/Delirium protocol (if indicated): none VAP protocol (if indicated) currently in place. DVT prophylaxis: SCDs only due  to recent GI bleed GI prophylaxis: On pantoprazole  Hyperglycemia protocol: Correction sliding scale only Mobility: Progressive ambulation once extubated Code Status: Full code Family Communication: Daughter is updated.  Frustration at the patient's seeming lack of progress.  Will obtain an elective GI consult tomorrow for ongoing  management of her chronic liver disease.  Labs   CBC: mild hemoglobin drift. Stable thrombocytopenia Recent Labs  Lab 08/02/18 2212 08/03/18 0341 08/03/18 0548 08/03/18 0932 08/03/18 2152 08/04/18 0242  WBC 3.2* 7.0  --   --  2.8* 4.1  HGB 9.0* 9.0* 8.5* 8.3* 8.1* 7.5*  HCT 27.0* 27.0* 25.1*  --  25.6* 24.3*  MCV 80.9 80.6  --   --  86.2 85.0  PLT 148* 198  --   --  121* 458*   Basic Metabolic Panel: Recent Labs  Lab 08/02/18 2212 08/03/18 0341 08/03/18 0548 08/04/18 0242  NA 137 137  --  138  K 3.8 4.4  --  4.1  CL 104 106  --  110  CO2 24 22  --  18*  GLUCOSE 137* 145*  --  184*  BUN 19 22  --  25*  CREATININE 0.72 0.69  --  0.89  CALCIUM 9.3 9.1  --  8.1*  MG  --   --  1.6*  --   PHOS  --   --  2.8  --    GFR: Estimated Creatinine Clearance: 63.1 mL/min (by C-G formula based on SCr of 0.89 mg/dL). Recent Labs  Lab 08/02/18 2212 08/03/18 0341 08/03/18 2152 08/04/18 0242  WBC 3.2* 7.0 2.8* 4.1  LATICACIDVEN 2.0*  --   --   --    Liver Function Tests: Mild transaminitis Recent Labs  Lab 08/02/18 2212  AST 105*  ALT 79*  ALKPHOS 216*  BILITOT 0.7  PROT 7.1  ALBUMIN 3.0*   Coagulation Profile: normal synthetic function Recent Labs  Lab 08/02/18 2212 08/03/18 0548  INR 1.09 1.16    CBG: adequate glycemic control Recent Labs  Lab 08/03/18 1158 08/03/18 [place], MD [facility] ICU Physician [place]  Pager: [phone] Mobile: [phone] After hours: [phone].

## 2018-08-05 ENCOUNTER — Inpatient Hospital Stay (HOSPITAL_COMMUNITY): Payer: Medicare HMO

## 2018-08-05 ENCOUNTER — Encounter (HOSPITAL_COMMUNITY): Payer: Self-pay

## 2018-08-05 DIAGNOSIS — I361 Nonrheumatic tricuspid (valve) insufficiency: Secondary | ICD-10-CM

## 2018-08-05 DIAGNOSIS — D62 Acute posthemorrhagic anemia: Secondary | ICD-10-CM

## 2018-08-05 LAB — CBC WITH DIFFERENTIAL/PLATELET
Abs Immature Granulocytes: 0 10*3/uL (ref 0.0–0.1)
Basophils Absolute: 0 10*3/uL (ref 0.0–0.1)
Basophils Relative: 1 %
Eosinophils Absolute: 0 10*3/uL (ref 0.0–0.7)
Eosinophils Relative: 1 %
HCT: 23.6 % — ABNORMAL LOW (ref 36.0–46.0)
Hemoglobin: 7.3 g/dL — ABNORMAL LOW (ref 12.0–15.0)
Immature Granulocytes: 0 %
Lymphocytes Relative: 31 %
Lymphs Abs: 1 10*3/uL (ref 0.7–4.0)
MCH: 26.9 pg (ref 26.0–34.0)
MCHC: 30.9 g/dL (ref 30.0–36.0)
MCV: 87.1 fL (ref 78.0–100.0)
Monocytes Absolute: 0.3 10*3/uL (ref 0.1–1.0)
Monocytes Relative: 9 %
Neutro Abs: 1.9 10*3/uL (ref 1.7–7.7)
Neutrophils Relative %: 58 %
Platelets: 120 10*3/uL — ABNORMAL LOW (ref 150–400)
RBC: 2.71 MIL/uL — ABNORMAL LOW (ref 3.87–5.11)
RDW: 19.5 % — ABNORMAL HIGH (ref 11.5–15.5)
WBC: 3.2 10*3/uL — ABNORMAL LOW (ref 4.0–10.5)

## 2018-08-05 LAB — CBC
HCT: 26.5 % — ABNORMAL LOW (ref 36.0–46.0)
Hemoglobin: 8 g/dL — ABNORMAL LOW (ref 12.0–15.0)
MCH: 26.8 pg (ref 26.0–34.0)
MCHC: 30.2 g/dL (ref 30.0–36.0)
MCV: 88.9 fL (ref 78.0–100.0)
Platelets: 119 10*3/uL — ABNORMAL LOW (ref 150–400)
RBC: 2.98 MIL/uL — ABNORMAL LOW (ref 3.87–5.11)
RDW: 19.7 % — ABNORMAL HIGH (ref 11.5–15.5)
WBC: 4.7 10*3/uL (ref 4.0–10.5)

## 2018-08-05 LAB — PROTIME-INR
INR: 1.14
Prothrombin Time: 14.6 seconds (ref 11.4–15.2)

## 2018-08-05 LAB — COMPREHENSIVE METABOLIC PANEL
ALT: 60 U/L — ABNORMAL HIGH (ref 0–44)
AST: 66 U/L — ABNORMAL HIGH (ref 15–41)
Albumin: 2.5 g/dL — ABNORMAL LOW (ref 3.5–5.0)
Alkaline Phosphatase: 133 U/L — ABNORMAL HIGH (ref 38–126)
Anion gap: 7 (ref 5–15)
BUN: 23 mg/dL (ref 8–23)
CO2: 21 mmol/L — ABNORMAL LOW (ref 22–32)
Calcium: 8.3 mg/dL — ABNORMAL LOW (ref 8.9–10.3)
Chloride: 114 mmol/L — ABNORMAL HIGH (ref 98–111)
Creatinine, Ser: 0.89 mg/dL (ref 0.44–1.00)
GFR calc Af Amer: >60 mL/min (ref >60)
GFR calc non Af Amer: >60 mL/min (ref >60)
Glucose, Bld: 136 mg/dL — ABNORMAL HIGH (ref 70–99)
Potassium: 3.5 mmol/L (ref 3.5–5.1)
Sodium: 142 mmol/L (ref 135–145)
Total Bilirubin: 0.9 mg/dL (ref 0.3–1.2)
Total Protein: 5.5 g/dL — ABNORMAL LOW (ref 6.5–8.1)

## 2018-08-05 LAB — MAGNESIUM: Magnesium: 1.9 mg/dL (ref 1.7–2.4)

## 2018-08-05 LAB — GLUCOSE, CAPILLARY
Glucose-Capillary: 95 mg/dL (ref 70–99)
Glucose-Capillary: 135 mg/dL — ABNORMAL HIGH (ref 70–99)

## 2018-08-05 LAB — ECHOCARDIOGRAM COMPLETE
Height: 63 in
Weight: 2832.47 oz

## 2018-08-05 MED ORDER — IOHEXOL 300 MG/ML  SOLN
100.0000 mL | Freq: Once | INTRAMUSCULAR | Status: AC | PRN
  Administered 2018-08-05: 100 mL via INTRAVENOUS

## 2018-08-05 MED ORDER — POTASSIUM CHLORIDE 10 MEQ/100ML IV SOLN
10.0000 meq | INTRAVENOUS | Status: AC
  Administered 2018-08-05 (×4): 10 meq via INTRAVENOUS
  Filled 2018-08-05 (×2): qty 100

## 2018-08-05 NOTE — Progress Notes (Signed)
  Echocardiogram 2D Echocardiogram has been performed.  Jennette Dubin 08/05/2018, 3:53 PM

## 2018-08-05 NOTE — Progress Notes (Signed)
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  Presented to [facility] with hematemesis and hematochezia.  She underwent upper endoscopy where she was found to have portal hypertensive gastropathy and bleeding from a large 5 mm esophageal varix.  Bleeding was controlled with hemospray however variceal banding was unsuccessful due to scarring from previous banding procedures.  She was transferred to [facility] for consideration of TIPS procedure.  Reason for Visit: Variceal bleeding  Consultants: Gastroenterology.  Interventional radiology.  Procedures: Upper endoscopy done at [facility].  Antibiotics: None  Subjective/Interval History: Patient had a bowel movement early this morning with dark blood in it.  Not bright red as before.  Denies any nausea vomiting.  No abdominal pain.  ROS: Denies any fever or chills  Objective:  Vital Signs  Vitals:   08/04/18 2047 08/05/18 0019 08/05/18 0045 08/05/18 0515  BP: (!) 105/50 (!) 107/54 (!) 103/54 (!) 109/52  Pulse: 62 60 (!) 59 62  Resp: _0 Temp: 98.1 F (36.7 C)   98.1 F (36.7 C)  TempSrc: Oral   Oral  SpO2: 100% 95%  96%  Weight:    80.3 kg  Height:        Intake/Output Summary (Last 24 hours) at 08/05/2018 1109 Last data filed at 08/05/2018 0500 Gross per 24 hour  Intake 700.25 ml  Output -  Net 700.25 ml   Filed Weights   08/03/18 2141 08/04/18 0446 08/05/18 0515  Weight: 82.3 kg 82.2 kg 80.3 kg    General appearance: alert, cooperative, appears stated age and no distress Head: Normocephalic, without obvious abnormality, atraumatic Resp: clear to auscultation bilaterally Cardio: regular rate and rhythm, S1, S2 normal, no murmur, click, rub or gallop GI: soft, non-tender; bowel sounds  normal; no masses,  no organomegaly Extremities: extremities normal, atraumatic, no cyanosis or edema Neurologic: Alert and oriented x3.  No focal neurological deficits.  Lab Results:  Data Reviewed: I have personally reviewed following labs and imaging studies  CBC: Recent Labs  Lab 08/03/18 0341 08/03/18 0548 08/03/18 0932 08/03/18 2152 08/04/18 0242 08/04/18 2140 08/05/18 0445  WBC 7.0  --   --  2.8* 4.1 4.3 3.2*  NEUTROABS  --   --   --   --   --  2.3 1.9  HGB 9.0* 8.5* 8.3* 8.1* 7.5* 7.8* 7.3*  HCT 27.0* 25.1*  --  25.6* 24.3* 25.5* 23.6*  MCV 80.6  --   --  86.2 85.0 87.0 87.1  PLT 198  --   --  121* 131* 112* 120*    Basic Metabolic Panel: Recent Labs  Lab 08/02/18 2212 08/03/18 0341 08/03/18 0548 08/04/18 0242 08/05/18 0445 08/05/18 0855  NA 137 137  --  138 142  --   K 3.8 4.4  --  4.1 3.5  --   CL 104 106  --  110 114*  --   CO2 24 22  --  18* 21*  --   GLUCOSE 137* 145*  --  184* 136*  --   BUN 19 22  --  25* 23  --   CREATININE 0.72 0.69  --  0.89 0.89  --   CALCIUM 9.3 9.1  --  8.1* 8.3*  --   MG  --   --  1.6*  --   --  1.9  PHOS  --   --  2.8  --   --   --     GFR: Estimated Creatinine Clearance: 62.4 mL/min (by C-G formula based on SCr of 0.89 mg/dL).  Liver Function Tests: Recent Labs  Lab 08/02/18 2212 08/05/18 0445  AST 105* 66*  ALT 79* 60*  ALKPHOS 216* 133*  BILITOT 0.7 0.9  PROT 7.1 5.5*  ALBUMIN 3.0* 2.5*    Coagulation Profile: Recent Labs  Lab 08/02/18 2212 08/03/18 0548 08/05/18 0855  INR 1.09 1.16 1.14   CBG: Recent Labs  Lab 08/03/18 0437 08/03/18 1158 08/03/18 1528  GLUCAP 95 163* 159*    Lipid Profile: Recent Labs    08/03/18 0932 08/03/18 2152  TRIG 182* 163*     Recent Results (from the past 240 hour(s))  MRSA PCR Screening     Status: None   Collection Time: 08/03/18  4:59 AM  Result Value Ref Range Status   MRSA by PCR NEGATIVE NEGATIVE Final    Comment:        The GeneXpert MRSA Assay  (FDA approved for NASAL specimens only), is one component of a comprehensive MRSA colonization surveillance program. It is not intended to diagnose MRSA infection nor to guide or monitor treatment for MRSA infections. Performed at [facility], [address], [place], [place] [address]       Radiology Studies: Dg Chest [facility] 1 View  Result Date: 08/04/2018 CLINICAL DATA:  Respiratory failure EXAM: PORTABLE CHEST 1 VIEW COMPARISON:  CT chest dated 05/15/2018. FINDINGS: Right infrahilar nodular opacity. When correlating with prior CT, this reflects distinct right middle and lower lobe nodules, suspicious for neoplasm. Left lung is clear.  No pleural effusion or pneumothorax. The heart is top-normal in size. IMPRESSION: Right infrahilar nodular opacity. When correlating with prior CT, this reflects distinct right middle and lower lobe nodules, suspicious for neoplasm. No evidence of acute cardiopulmonary disease. Electronically Signed   By: Julian Hy M.D.   On: 08/04/2018 07:50   US Abdomen Limited Ruq  Result Date: 08/03/2018 CLINICAL DATA:  Cirrhosis. EXAM: ULTRASOUND ABDOMEN LIMITED RIGHT UPPER QUADRANT COMPARISON:  Ultrasound dated 05/29/2018 and PET-CT dated 03/08/2018 and CT scan of the abdomen dated 02/09/2018 FINDINGS: Gallbladder: At least 2 multiple gallstones are present. There is gallbladder wall thickening to 8 mm. There is pericholecystic fluid. The appearance is essentially unchanged since the prior exam except for the pericholecystic fluid, which is minimal. Common bile duct: Diameter: 5.5 mm, normal. Liver: No focal lesions. Coarse echotexture of the liver parenchyma with nodularity of the surface of the liver consistent with cirrhosis. Portal vein is patent on color Doppler imaging with normal direction of blood flow towards the liver. The portal vein is distended to 17.4 mm. Slight ascites around the liver. IMPRESSION: 1. Dilated portal vein. However, there is  normal flow in the portal vein. 2. Cirrhosis. 3. Chronic cholecystitis with chronic thickening of the gallbladder wall. 4. Small amount of ascites including around the gallbladder. Electronically Signed   By: Lorriane Shire M.D.   On: 08/03/2018 12:12     Medications:  Scheduled: . Influenza vac split quadrivalent PF  0.5 mL Intramuscular Tomorrow-1000  . [START ON 08/07/2018] pantoprazole  40 mg Intravenous Q12H  . propranolol  20 mg Oral BID   Continuous: . sodium chloride    . octreotide  (SANDOSTATIN)    IV infusion 50 mcg/hr (08/05/18 1023)  . potassium chloride 10 mEq (08/05/18 1023)   QPY:[id number]  chloride  Assessment/Plan:    Acute GI bleed secondary to variceal bleeding in the setting of liver cirrhosis Patient initially presented to [facility] hospital.  She underwent upper endoscopy with finding of active bleeding.  Hemospray utilized.  Banding could not be done due to previous scarring.  Patient with bowel movement this morning with dark blood in it.  Could be old blood.  Hemoglobin is stable for the most part.  Gastroenterology is following.  Plan is for a TIPS procedure sometime during this week.  Patient is on IV PPI twice daily.  Patient is on octreotide.  Acute blood loss anemia Status post transfusion.  IV iron provided.  Recheck hemoglobin later today.  Acute respiratory failure due to periprocedural sedation She was intubated.  Now extubated and respiratory status appears to be stable.  Liver cirrhosis due to N ASH Meld score of 10.  Gastroenterology is following.  History of diabetes mellitus type 2 Monitor CBGs.  SSI.  DVT Prophylaxis: SCDs    Code Status: Full code Family Communication: Discussed with the patient and her daughter Disposition Plan: Management as outlined above.  Await TIPS procedure.    LOS: 2 days   [place] [facility] Pager [phone] 08/05/2018, 11:09 AM  If 7PM-7AM, please contact night-coverage at  [email or web address], password [facility]

## 2018-08-05 NOTE — Progress Notes (Signed)
[facility] Gastroenterology Progress Note  Crystal [name] 67 y.o. 1950/11/15  CC: Variceal bleeding   Subjective: She had one episode of bright blood per rectum yesterday.  Denies abdominal pain, nausea vomiting.  ROS : Negative for chest pain and shortness of breath.   Objective: Vital signs in last 24 hours: Vitals:   08/05/18 0045 08/05/18 0515  BP: (!) 103/54 (!) 109/52  Pulse: (!) 59 62  Resp:  18  Temp:  98.1 F (36.7 C)  SpO2:  96%    Physical Exam:  General:  Alert, cooperative, no distress, appears stated age  Head:  Normocephalic, without obvious abnormality, atraumatic  Eyes:  , EOM's intact,   Lungs:   Clear to auscultation bilaterally, respirations unlabored  Heart:  Regular rate and rhythm, S1, S2 normal  Abdomen:   Soft, non-tender, bowel sounds present, no peritoneal signs          Lab Results: Recent Labs    08/03/18 0548 08/04/18 0242 08/05/18 0445  NA  --  138 142  K  --  4.1 3.5  CL  --  110 114*  CO2  --  18* 21*  GLUCOSE  --  184* 136*  BUN  --  25* 23  CREATININE  --  0.89 0.89  CALCIUM  --  8.1* 8.3*  MG 1.6*  --   --   PHOS 2.8  --   --    Recent Labs    08/02/18 2212 08/05/18 0445  AST 105* 66*  ALT 79* 60*  ALKPHOS 216* 133*  BILITOT 0.7 0.9  PROT 7.1 5.5*  ALBUMIN 3.0* 2.5*   Recent Labs    08/04/18 2140 08/05/18 0445  WBC 4.3 3.2*  NEUTROABS 2.3 1.9  HGB 7.8* 7.3*  HCT 25.5* 23.6*  MCV 87.0 87.1  PLT 112* 120*   Recent Labs    08/03/18 0548 08/05/18 0855  LABPROT 14.7 14.6  INR 1.16 1.14      Assessment/Plan: -Active bleeding from esophageal varices at the time of esophageal banding. S/P Hemospray  -History of esophageal varices with repeated band ligation resulting in scarring from previous treatment. -Cirrhosis.  Most likely from [place].  Recommendations -------------------------- -Currently followed by interventional radiology.  Plan for TIPS procedure probably tomorrow or Wednesday. -Monitor H&H.   Transfuse to keep hemoglobin around 7. -Patient having bright blood/maroon-colored blood in the rectum, hopefully  old blood.  Increase octreotide to 50 mcg/h. -GI will follow.   Otis Brace MD, [place] 08/05/2018, 10:10 AM  Contact #  [phone]

## 2018-08-05 NOTE — Progress Notes (Signed)
Pt's daughter says tomorrow is not a convenient day for the TIPS as she has a meeting. Wednesday would be a better day for her. Please contact daughter before making any plans for TIPS if schedule opens up on Tuesday

## 2018-08-06 ENCOUNTER — Encounter (HOSPITAL_COMMUNITY): Payer: Self-pay | Admitting: General Practice

## 2018-08-06 LAB — CBC WITH DIFFERENTIAL/PLATELET
Basophils Absolute: 0 10*3/uL (ref 0.0–0.1)
Basophils Relative: 1 %
Eosinophils Absolute: 0.1 10*3/uL (ref 0.0–0.7)
Eosinophils Relative: 4 %
HCT: 25.2 % — ABNORMAL LOW (ref 36.0–46.0)
Hemoglobin: 7.6 g/dL — ABNORMAL LOW (ref 12.0–15.0)
Lymphocytes Relative: 24 %
Lymphs Abs: 0.7 10*3/uL (ref 0.7–4.0)
MCH: 27.3 pg (ref 26.0–34.0)
MCHC: 30.2 g/dL (ref 30.0–36.0)
MCV: 90.6 fL (ref 78.0–100.0)
Monocytes Absolute: 0.2 10*3/uL (ref 0.1–1.0)
Monocytes Relative: 8 %
Neutro Abs: 2 10*3/uL (ref 1.7–7.7)
Neutrophils Relative %: 63 %
Platelets: UNDETERMINED 10*3/uL (ref 150–400)
RBC: 2.78 MIL/uL — ABNORMAL LOW (ref 3.87–5.11)
RDW: 19.8 % — ABNORMAL HIGH (ref 11.5–15.5)
WBC: 3 10*3/uL — ABNORMAL LOW (ref 4.0–10.5)

## 2018-08-06 LAB — BASIC METABOLIC PANEL
Anion gap: 8 (ref 5–15)
BUN: 13 mg/dL (ref 8–23)
CO2: 21 mmol/L — ABNORMAL LOW (ref 22–32)
Calcium: 8.5 mg/dL — ABNORMAL LOW (ref 8.9–10.3)
Chloride: 113 mmol/L — ABNORMAL HIGH (ref 98–111)
Creatinine, Ser: 0.78 mg/dL (ref 0.44–1.00)
GFR calc Af Amer: >60 mL/min (ref >60)
GFR calc non Af Amer: >60 mL/min (ref >60)
Glucose, Bld: 135 mg/dL — ABNORMAL HIGH (ref 70–99)
Potassium: 3.5 mmol/L (ref 3.5–5.1)
Sodium: 142 mmol/L (ref 135–145)

## 2018-08-06 MED ORDER — POTASSIUM CHLORIDE 10 MEQ/100ML IV SOLN
10.0000 meq | INTRAVENOUS | Status: AC
  Administered 2018-08-06 (×4): 10 meq via INTRAVENOUS
  Filled 2018-08-06 (×4): qty 100

## 2018-08-06 NOTE — Progress Notes (Signed)
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  Presented to [facility] with hematemesis and hematochezia.  She underwent upper endoscopy where she was found to have portal hypertensive gastropathy and bleeding from a large 5 mm esophageal varix.  Bleeding was controlled with hemospray however variceal banding was unsuccessful due to scarring from previous banding procedures.  She was transferred to [facility] for consideration of TIPS procedure.  Reason for Visit: Variceal bleeding  Consultants: Gastroenterology.  Interventional radiology.  Procedures: Upper endoscopy done at [facility].  Transthoracic echocardiogram Study Conclusions  - Left ventricle: The cavity size was normal. Wall thickness was   normal. Systolic function was normal. The estimated ejection   fraction was in the range of 60% to 65%. Wall motion was normal;   there were no regional wall motion abnormalities. Doppler   parameters are consistent with abnormal left ventricular   relaxation (grade 1 diastolic dysfunction). - Left atrium: The atrium was moderately to severely dilated. - Right atrium: The atrium was mildly dilated.  Antibiotics: None  Subjective/Interval History: Patient has not had any more episodes of rectal bleeding.  She feels well.  Denies any nausea vomiting or abdominal pain.    ROS: Denies any fever or chills.  Objective:  Vital Signs  Vitals:   08/05/18 0515 08/05/18 1409 08/05/18 2137 08/06/18 0446  BP: (!) 109/52 120/62 123/62 117/61  Pulse: 62 (!) 56 60 (!) 55  Resp: _0 Temp: 98.1 F (36.7 C) 98.5 F (36.9 C) 98.2 F (36.8 C) 98.5 F (36.9 C)  TempSrc: Oral Oral Oral Oral  SpO2: 96% 99% 93% 94%  Weight: 80.3 kg     Height:         Intake/Output Summary (Last 24 hours) at 08/06/2018 1109 Last data filed at 08/06/2018 1031 Gross per 24 hour  Intake 270 ml  Output -  Net 270 ml   Filed Weights   08/03/18 2141 08/04/18 0446 08/05/18 0515  Weight: 82.3 kg 82.2 kg 80.3 kg    General appearance: Awake alert.  No distress Resp: Clear to auscultation bilaterally. Cardio: S1-S2 is normal regular.  No S3-S4.  No rubs murmurs or bruit GI: Abdomen remains soft.  Nontender nondistended. Extremities: No edema Neurologic: Alert and oriented x3.  No focal neurological deficits.  Lab Results:  Data Reviewed: I have personally reviewed following labs and imaging studies  CBC: Recent Labs  Lab 08/04/18 0242 08/04/18 2140 08/05/18 0445 08/05/18 1525 08/06/18 0601  WBC 4.1 4.3 3.2* 4.7 PENDING  NEUTROABS  --  2.3 1.9  --  PENDING  HGB 7.5* 7.8* 7.3* 8.0* 7.6*  HCT 24.3* 25.5* 23.6* 26.5* 25.2*  MCV 85.0 87.0 87.1 88.9 90.6  PLT 131* 112* 120* 119* PENDING    Basic Metabolic Panel: Recent Labs  Lab 08/02/18 2212 08/03/18 0341 08/03/18 0548 08/04/18 0242 08/05/18 0445 08/05/18 0855 08/06/18 0601  NA 137 137  --  138 142  --  142  K 3.8 4.4  --  4.1 3.5  --  3.5  CL 104 106  --  110 114*  --  113*  CO2 24 22  --  18* 21*  --  21*  GLUCOSE 137* 145*  --  184* 136*  --  135*  BUN 19 22  --  25* 23  --  13  CREATININE 0.72 0.69  --  0.89 0.89  --  0.78  CALCIUM 9.3 9.1  --  8.1* 8.3*  --  8.5*  MG  --   --  1.6*  --   --  1.9  --   PHOS  --   --  2.8  --   --   --   --     GFR: Estimated Creatinine Clearance: 69.5 mL/min (by C-G formula based on SCr of 0.78 mg/dL).  Liver Function Tests: Recent Labs  Lab 08/02/18 2212 08/05/18 0445  AST 105* 66*  ALT 79* 60*  ALKPHOS 216* 133*  BILITOT 0.7 0.9  PROT 7.1 5.5*  ALBUMIN 3.0* 2.5*    Coagulation Profile: Recent Labs  Lab 08/02/18 2212 08/03/18 0548 08/05/18 0855  INR 1.09 1.16 1.14   CBG: Recent Labs  Lab 08/03/18 0437 08/03/18 1158  08/03/18 1528 08/05/18 2149  GLUCAP 95 163* 159* 135*    Lipid Profile: Recent Labs    08/03/18 2152  TRIG 163*     Recent Results (from the past 240 hour(s))  MRSA PCR Screening     Status: None   Collection Time: 08/03/18  4:59 AM  Result Value Ref Range Status   MRSA by PCR NEGATIVE NEGATIVE Final    Comment:        The GeneXpert MRSA Assay (FDA approved for NASAL specimens only), is one component of a comprehensive MRSA colonization surveillance program. It is not intended to diagnose MRSA infection nor to guide or monitor treatment for MRSA infections. Performed at [facility], [address], [place], [place] [address]       Radiology Studies: Ct Abdomen Pelvis W Contrast  Result Date: 08/05/2018 CLINICAL DATA:  67 year old with NASH cirrhosis and variceal bleeding. Patient is being evaluated for TIPS procedure. EXAM: CT ABDOMEN AND PELVIS WITH CONTRAST TECHNIQUE: Multidetector CT imaging of the abdomen and pelvis was performed using the standard protocol following bolus administration of intravenous contrast. CONTRAST:  171m OMNIPAQUE IOHEXOL 300 MG/ML  SOLN COMPARISON:  Chest CT 05/15/2018 and abdominal CT 02/09/2018 FINDINGS: Lower chest: Again noted is a lesion in the right lower lobe measuring roughly 1.7 cm and minimally changed from the most recent CT. No large pleural effusions. Hepatobiliary: Perihepatic ascites. Liver appears to be mildly nodular and similar to the prior examination. Mild distention of the gallbladder with a calcified stone. Punctate low-density structure at the hepatic dome is unchanged and suggestive for a small cyst. No suspicious hepatic lesions. Main portal vein, right portal vein and left portal vein are patent. No biliary dilatation. Pancreas: Unremarkable. No pancreatic ductal dilatation or surrounding inflammatory changes. Spleen: Stable enlargement of the spleen. Small amount of ascites in left upper abdomen. Adrenals/Urinary  Tract: Normal adrenal glands. Normal appearance of both kidneys without hydronephrosis. No suspicious renal lesions. Gas in the urinary bladder may be iatrogenic. No evidence for bladder wall thickening. Stomach/Bowel: No gross abnormality to the stomach, small bowel or colon. No acute bowel inflammatory changes. Vascular/Lymphatic: Atherosclerotic calcifications at the right renal artery origin. At least mild narrowing at the origin of the right renal artery. Main visceral arteries are patent. Negative for an aortic aneurysm. Iliac veins are patent. IVC is patent. Small varices involving the distal esophagus and GE junction associated with the left gastric or coronary vein. Few small gastric varices which are probably involving the short gastric veins. Enlarged lymph nodes throughout the gastrohepatic ligament and upper abdomen are similar to the  prior abdominal CT and likely secondary to cirrhosis. Index lymph node near the porta hepatis measures 2.1 cm on sequence 6 image 22 and stable. Again noted is lymph node enlargement in the central abdominal mesentery. Reproductive: Evidence for tubal ligation. Again noted is a large low-density structure involving the left ovary/adnexa that measures roughly 7 cm and similar to the CT from 02/09/2018. this was a simple cyst on ultrasound from 03/18/2018. Other: Small amount of upper abdominal ascites. No significant pelvic ascites. Diffuse subcutaneous edema. Negative for free air. Musculoskeletal: Anterolisthesis at L4-L5 is likely secondary to facet arthropathy. IMPRESSION: 1. Small varices in the distal esophagus and GE junction associated with the left gastric vein. Portal venous system is patent. Cirrhotic changes with portal hypertension demonstrated by esophageal varices, ascites and splenomegaly. 2. Stable abdominal lymphadenopathy which is likely secondary to cirrhosis. 3. No significant change in the right lower lobe pulmonary nodule. 4. Gas within the urinary  bladder is nonspecific and could be iatrogenic. 5. No change in the large left adnexal/ovarian cyst. 6. Cholelithiasis. Electronically Signed   By: Markus Daft M.D.   On: 08/05/2018 17:09     Medications:  Scheduled: . [START ON 08/07/2018] pantoprazole  40 mg Intravenous Q12H  . propranolol  20 mg Oral BID   Continuous: . sodium chloride    . octreotide  (SANDOSTATIN)    IV infusion 50 mcg/hr (08/06/18 0152)   YSA:YTKZSW chloride  Assessment/Plan:    Acute GI bleed secondary to variceal bleeding in the setting of liver cirrhosis Patient initially presented to [facility] hospital.  She underwent upper endoscopy with finding of active bleeding.  Hemospray utilized.  Banding could not be done due to previous scarring.  She did pass some old blood yesterday.  No further bowel movements.  Hemoglobin has been stable.  Gastroenterology is following.  Interventional radiology also consulted.  Plan is for TIPS procedure tomorrow.  Patient remains on octreotide and IV PPI.  Patient also noted to be on propranolol.  Acute blood loss anemia Status post transfusion.  IV iron provided.  Hemoglobin is stable.  Acute respiratory failure due to periprocedural sedation Resolved.  She was intubated for airway protection.  Subsequently extubated.  Respiratory status is stable.  Liver cirrhosis due to N ASH Meld score of 10.  Gastroenterology is following.  History of diabetes mellitus type 2 Monitor CBGs.  SSI.  HbA1c 5.6 in May.  DVT Prophylaxis: SCDs    Code Status: Full code Family Communication: Discussed with the patient and her daughter Disposition Plan: Management as outlined above.  TIPS planned for tomorrow.    LOS: 3 days   [facility] Pager [phone] 08/06/2018, 11:09 AM  If 7PM-7AM, please contact night-coverage at [email or web address], password [facility]

## 2018-08-06 NOTE — Care Management Important Message (Signed)
Important Message  Patient Details  Name: Crystal [name] MRN: [id number] Date of Birth: [birth year]   Medicare Important Message Given:  Yes    Linsie Lupo Montine Circle 08/06/2018, 3:37 PM

## 2018-08-06 NOTE — Progress Notes (Signed)
[facility] Gastroenterology Progress Note  Crystal [name] 67 y.o. Feb 11, 1951  CC: Variceal bleeding   Subjective: Denied further bleeding episodes.  No bowel movement today.  Opening of mild right upper quadrant discomfort but no significant abdominal pain.  Denies nausea vomiting.  ROS : Negative for chest pain and shortness of breath.   Objective: Vital signs in last 24 hours: Vitals:   08/05/18 2137 08/06/18 0446  BP: 123/62 117/61  Pulse: 60 (!) 55  Resp: 17 18  Temp: 98.2 F (36.8 C) 98.5 F (36.9 C)  SpO2: 93% 94%    Physical Exam:  General:  Alert, cooperative, no distress, appears stated age  Head:  Normocephalic, without obvious abnormality, atraumatic  Eyes:  , EOM's intact,   Lungs:   Clear to auscultation bilaterally, respirations unlabored  Heart:  Regular rate and rhythm, S1, S2 normal  Abdomen:   Soft, non-tender, bowel sounds present, no peritoneal signs          Lab Results: Recent Labs    08/05/18 0445 08/05/18 0855 08/06/18 0601  NA 142  --  142  K 3.5  --  3.5  CL 114*  --  113*  CO2 21*  --  21*  GLUCOSE 136*  --  135*  BUN 23  --  13  CREATININE 0.89  --  0.78  CALCIUM 8.3*  --  8.5*  MG  --  1.9  --    Recent Labs    08/05/18 0445  AST 66*  ALT 60*  ALKPHOS 133*  BILITOT 0.9  PROT 5.5*  ALBUMIN 2.5*   Recent Labs    08/04/18 2140 08/05/18 0445 08/05/18 1525  WBC 4.3 3.2* 4.7  NEUTROABS 2.3 1.9  --   HGB 7.8* 7.3* 8.0*  HCT 25.5* 23.6* 26.5*  MCV 87.0 87.1 88.9  PLT 112* 120* 119*   Recent Labs    08/05/18 0855  LABPROT 14.6  INR 1.14      Assessment/Plan: -Active bleeding from esophageal varices at the time of esophageal banding. S/P Hemospray  -History of esophageal varices with repeated band ligation resulting in scarring from previous treatment. -Cirrhosis.  Most likely from [place].  Recommendations -------------------------- -Currently followed by interventional radiology.  Plan for TIPS procedure  tomorrow -Monitor H&H.  Transfuse to keep hemoglobin around 7. -Continue octreotide to 50 mcg/h. -GI will follow.  She will need follow-up with primary GI doctor after discharge.   Otis Brace MD, [place] 08/06/2018, 8:57 AM  Contact #  [phone]

## 2018-08-07 ENCOUNTER — Encounter (HOSPITAL_COMMUNITY): Payer: Self-pay | Admitting: Orthopedic Surgery

## 2018-08-07 ENCOUNTER — Encounter (HOSPITAL_COMMUNITY)
Admission: AD | Disposition: A | Discharge status: Home Health | Payer: Self-pay | Source: Other Acute Inpatient Hospital | Attending: Internal Medicine

## 2018-08-07 ENCOUNTER — Inpatient Hospital Stay (HOSPITAL_COMMUNITY): Payer: Medicare HMO | Admitting: Anesthesiology

## 2018-08-07 ENCOUNTER — Inpatient Hospital Stay (HOSPITAL_COMMUNITY): Payer: Medicare HMO

## 2018-08-07 DIAGNOSIS — K922 Gastrointestinal hemorrhage, unspecified: Secondary | ICD-10-CM

## 2018-08-07 DIAGNOSIS — K7460 Unspecified cirrhosis of liver: Secondary | ICD-10-CM

## 2018-08-07 DIAGNOSIS — I8510 Secondary esophageal varices without bleeding: Secondary | ICD-10-CM

## 2018-08-07 HISTORY — PX: RADIOLOGY WITH ANESTHESIA: SHX6223

## 2018-08-07 HISTORY — PX: IR EMBO ART  VEN HEMORR LYMPH EXTRAV  INC GUIDE ROADMAPPING: IMG5450

## 2018-08-07 HISTORY — PX: IR TIPS: IMG2295

## 2018-08-07 LAB — BASIC METABOLIC PANEL
Anion gap: 10 (ref 5–15)
BUN: 9 mg/dL (ref 8–23)
CO2: 17 mmol/L — ABNORMAL LOW (ref 22–32)
Calcium: 8.3 mg/dL — ABNORMAL LOW (ref 8.9–10.3)
Chloride: 114 mmol/L — ABNORMAL HIGH (ref 98–111)
Creatinine, Ser: 0.8 mg/dL (ref 0.44–1.00)
GFR calc Af Amer: >60 mL/min (ref >60)
GFR calc non Af Amer: >60 mL/min (ref >60)
Glucose, Bld: 120 mg/dL — ABNORMAL HIGH (ref 70–99)
Potassium: 3.8 mmol/L (ref 3.5–5.1)
Sodium: 141 mmol/L (ref 135–145)

## 2018-08-07 LAB — CBC WITH DIFFERENTIAL/PLATELET
Abs Immature Granulocytes: 0 10*3/uL (ref 0.0–0.1)
Basophils Absolute: 0 10*3/uL (ref 0.0–0.1)
Basophils Relative: 1 %
Eosinophils Absolute: 0.2 10*3/uL (ref 0.0–0.7)
Eosinophils Relative: 6 %
HCT: 27.9 % — ABNORMAL LOW (ref 36.0–46.0)
Hemoglobin: 8.5 g/dL — ABNORMAL LOW (ref 12.0–15.0)
Immature Granulocytes: 1 %
Lymphocytes Relative: 29 %
Lymphs Abs: 1 10*3/uL (ref 0.7–4.0)
MCH: 27.2 pg (ref 26.0–34.0)
MCHC: 30.5 g/dL (ref 30.0–36.0)
MCV: 89.4 fL (ref 78.0–100.0)
Monocytes Absolute: 0.3 10*3/uL (ref 0.1–1.0)
Monocytes Relative: 7 %
Neutro Abs: 1.9 10*3/uL (ref 1.7–7.7)
Neutrophils Relative %: 56 %
Platelets: 116 10*3/uL — ABNORMAL LOW (ref 150–400)
RBC: 3.12 MIL/uL — ABNORMAL LOW (ref 3.87–5.11)
RDW: 20.2 % — ABNORMAL HIGH (ref 11.5–15.5)
WBC: 3.4 10*3/uL — ABNORMAL LOW (ref 4.0–10.5)

## 2018-08-07 LAB — GLUCOSE, CAPILLARY: Glucose-Capillary: 102 mg/dL — ABNORMAL HIGH (ref 70–99)

## 2018-08-07 LAB — PREPARE RBC (CROSSMATCH)

## 2018-08-07 LAB — HEMOGLOBIN AND HEMATOCRIT, BLOOD
HCT: 29.1 % — ABNORMAL LOW (ref 36.0–46.0)
Hemoglobin: 8.7 g/dL — ABNORMAL LOW (ref 12.0–15.0)

## 2018-08-07 SURGERY — IR WITH ANESTHESIA
Anesthesia: General

## 2018-08-07 MED ORDER — LACTATED RINGERS IV SOLN
INTRAVENOUS | Status: DC | PRN
  Administered 2018-08-07: 14:00:00 via INTRAVENOUS

## 2018-08-07 MED ORDER — SODIUM CHLORIDE 0.9% FLUSH
10.0000 mL | INTRAVENOUS | Status: DC | PRN
  Administered 2018-08-10: 20 mL
  Filled 2018-08-07: qty 40

## 2018-08-07 MED ORDER — FENTANYL CITRATE (PF) 100 MCG/2ML IJ SOLN: 25.0000 ug | INTRAMUSCULAR | Status: DC | PRN

## 2018-08-07 MED ORDER — LACTULOSE 10 GM/15ML PO SOLN
20.0000 g | Freq: Three times a day (TID) | ORAL | Status: DC
  Administered 2018-08-07 – 2018-08-09 (×4): 20 g via ORAL
  Filled 2018-08-07 (×6): qty 30

## 2018-08-07 MED ORDER — ONDANSETRON HCL 4 MG/2ML IJ SOLN: 4.0000 mg | Freq: Once | INTRAMUSCULAR | Status: DC | PRN

## 2018-08-07 MED ORDER — DEXAMETHASONE SODIUM PHOSPHATE 10 MG/ML IJ SOLN
INTRAMUSCULAR | Status: DC | PRN
  Administered 2018-08-07: 10 mg via INTRAVENOUS

## 2018-08-07 MED ORDER — HYDROCODONE-ACETAMINOPHEN 5-325 MG PO TABS
2.0000 | ORAL_TABLET | Freq: Once | ORAL | Status: AC
  Administered 2018-08-07: 2 via ORAL
  Filled 2018-08-07: qty 2

## 2018-08-07 MED ORDER — MIDAZOLAM HCL 2 MG/2ML IJ SOLN
INTRAMUSCULAR | Status: AC
  Filled 2018-08-07: qty 2

## 2018-08-07 MED ORDER — EPHEDRINE SULFATE 50 MG/ML IJ SOLN
INTRAMUSCULAR | Status: DC | PRN
  Administered 2018-08-07: 5 mg via INTRAVENOUS
  Administered 2018-08-07 (×2): 10 mg via INTRAVENOUS
  Administered 2018-08-07: 5 mg via INTRAVENOUS

## 2018-08-07 MED ORDER — HYDROCODONE-ACETAMINOPHEN 5-325 MG PO TABS
1.0000 | ORAL_TABLET | Freq: Four times a day (QID) | ORAL | Status: DC | PRN
  Administered 2018-08-08: 1 via ORAL
  Administered 2018-08-08 (×2): 2 via ORAL
  Administered 2018-08-09: 1 via ORAL
  Filled 2018-08-07: qty 2
  Filled 2018-08-07 (×2): qty 1
  Filled 2018-08-07 (×2): qty 2

## 2018-08-07 MED ORDER — FENTANYL CITRATE (PF) 100 MCG/2ML IJ SOLN
INTRAMUSCULAR | Status: DC | PRN
  Administered 2018-08-07: 50 ug via INTRAVENOUS
  Administered 2018-08-07: 25 ug via INTRAVENOUS
  Administered 2018-08-07: 50 ug via INTRAVENOUS
  Administered 2018-08-07: 150 ug via INTRAVENOUS
  Administered 2018-08-07: 25 ug via INTRAVENOUS
  Administered 2018-08-07: 100 ug via INTRAVENOUS

## 2018-08-07 MED ORDER — SODIUM CHLORIDE 0.9% IV SOLUTION: Freq: Once | INTRAVENOUS | Status: DC

## 2018-08-07 MED ORDER — LACTATED RINGERS IV SOLN
INTRAVENOUS | Status: DC
  Administered 2018-08-07 (×2): via INTRAVENOUS

## 2018-08-07 MED ORDER — ROCURONIUM BROMIDE 100 MG/10ML IV SOLN
INTRAVENOUS | Status: DC | PRN
  Administered 2018-08-07: 30 mg via INTRAVENOUS
  Administered 2018-08-07: 20 mg via INTRAVENOUS
  Administered 2018-08-07: 50 mg via INTRAVENOUS

## 2018-08-07 MED ORDER — ONDANSETRON HCL 4 MG/2ML IJ SOLN
INTRAMUSCULAR | Status: DC | PRN
  Administered 2018-08-07: 4 mg via INTRAVENOUS

## 2018-08-07 MED ORDER — IOHEXOL 300 MG/ML  SOLN
150.0000 mL | Freq: Once | INTRAMUSCULAR | Status: AC | PRN
  Administered 2018-08-07: 80 mL via INTRAVENOUS

## 2018-08-07 MED ORDER — MIDAZOLAM HCL 5 MG/5ML IJ SOLN
INTRAMUSCULAR | Status: DC | PRN
  Administered 2018-08-07: 2 mg via INTRAVENOUS

## 2018-08-07 MED ORDER — LIDOCAINE HCL (CARDIAC) PF 100 MG/5ML IV SOSY
PREFILLED_SYRINGE | INTRAVENOUS | Status: DC | PRN
  Administered 2018-08-07: 40 mg via INTRAVENOUS

## 2018-08-07 MED ORDER — CEFAZOLIN SODIUM-DEXTROSE 2-4 GM/100ML-% IV SOLN
INTRAVENOUS | Status: AC
  Filled 2018-08-07: qty 100

## 2018-08-07 MED ORDER — CEFAZOLIN SODIUM-DEXTROSE 2-3 GM-%(50ML) IV SOLR
INTRAVENOUS | Status: DC | PRN
  Administered 2018-08-07: 2 g via INTRAVENOUS

## 2018-08-07 MED ORDER — OXYCODONE HCL 5 MG/5ML PO SOLN: 5.0000 mg | Freq: Once | ORAL | Status: DC | PRN

## 2018-08-07 MED ORDER — PROPOFOL 10 MG/ML IV BOLUS
INTRAVENOUS | Status: DC | PRN
  Administered 2018-08-07: 100 mg via INTRAVENOUS

## 2018-08-07 MED ORDER — SODIUM CHLORIDE 0.9 % IV SOLN
INTRAVENOUS | Status: DC
  Administered 2018-08-08 (×2): via INTRAVENOUS

## 2018-08-07 MED ORDER — OXYCODONE HCL 5 MG PO TABS: 5.0000 mg | ORAL_TABLET | Freq: Once | ORAL | Status: DC | PRN

## 2018-08-07 MED ORDER — FENTANYL CITRATE (PF) 250 MCG/5ML IJ SOLN
INTRAMUSCULAR | Status: AC
  Filled 2018-08-07: qty 10

## 2018-08-07 MED ORDER — SUGAMMADEX SODIUM 200 MG/2ML IV SOLN
INTRAVENOUS | Status: DC | PRN
  Administered 2018-08-07: 300 mg via INTRAVENOUS

## 2018-08-07 NOTE — Anesthesia Postprocedure Evaluation (Signed)
Anesthesia Post Note  Patient: Crystal [name]  Procedure(s) Performed: TIPS (N/A )     Patient location during evaluation: PACU Anesthesia Type: General Level of consciousness: awake and alert Pain management: pain level controlled Vital Signs Assessment: post-procedure vital signs reviewed and stable Respiratory status: spontaneous breathing, nonlabored ventilation, respiratory function stable and patient connected to nasal cannula oxygen Cardiovascular status: blood pressure returned to baseline and stable Postop Assessment: no apparent nausea or vomiting Anesthetic complications: no    Last Vitals:  Vitals:   08/07/18 1955 08/07/18 2154  BP:  (!) 129/59  Pulse:  62  Resp:  16  Temp:  37 C  SpO2: 97% 97%    Last Pain:  Vitals:   08/07/18 2154  TempSrc: Oral  PainSc:                  Crystal [name]

## 2018-08-07 NOTE — Sedation Documentation (Signed)
PAP Pressure: 15

## 2018-08-07 NOTE — Anesthesia Preprocedure Evaluation (Addendum)
Anesthesia Evaluation  Patient identified by MRN, date of birth, ID band Patient awake    Reviewed: Allergy & Precautions, NPO status , Patient's Chart, lab work & pertinent test results  Airway Mallampati: II  TM Distance: >3 FB Neck ROM: Full    Dental  (+) Edentulous Upper, Edentulous Lower   Pulmonary neg pulmonary ROS,    breath sounds clear to auscultation       Cardiovascular negative cardio ROS   Rhythm:Regular Rate:Normal  ECG: NSR, rate 67  ECHO: LV EF: 60% -   65%   Neuro/Psych negative neurological ROS  negative psych ROS   GI/Hepatic (+) Cirrhosis       , Hepatitis -, C  Endo/Other  diabetes  Renal/GU negative Renal ROS     Musculoskeletal negative musculoskeletal ROS (+)   Abdominal (+) + obese,   Peds  Hematology  (+) anemia , Neutropenia  Thrombocytopenia    Anesthesia Other Findings Esophageal varices  Reproductive/Obstetrics                            Anesthesia Physical Anesthesia Plan  ASA: III  Anesthesia Plan: General   Post-op Pain Management:    Induction: Intravenous and Rapid sequence  PONV Risk Score and Plan: 3 and Ondansetron, Dexamethasone and Treatment may vary due to age or medical condition  Airway Management Planned: Oral ETT  Additional Equipment: Arterial line  Intra-op Plan:   Post-operative Plan: Extubation in OR  Informed Consent: I have reviewed the patients History and Physical, chart, labs and discussed the procedure including the risks, benefits and alternatives for the proposed anesthesia with the patient or authorized representative who has indicated his/her understanding and acceptance.   Dental advisory given  Plan Discussed with: CRNA  Anesthesia Plan Comments:        Anesthesia Quick Evaluation

## 2018-08-07 NOTE — Sedation Documentation (Signed)
PAP measure: 24

## 2018-08-07 NOTE — Sedation Documentation (Signed)
PAP Measure: 13

## 2018-08-07 NOTE — Anesthesia Procedure Notes (Signed)
Central Venous Catheter Insertion Performed by: Roberts Gaudy, MD, anesthesiologist Start/End9/25/2019 2:30 PM, 08/07/2018 2:40 PM Patient location: Pre-op. Preanesthetic checklist: patient identified, IV checked, site marked, risks and benefits discussed, surgical consent, monitors and equipment checked, pre-op evaluation, timeout performed and anesthesia consent Position: supine Lidocaine 1% used for infiltration and patient sedated Hand hygiene performed  and maximum sterile barriers used  Catheter size: 8 Fr Total catheter length 16. Central line was placed.Double lumen Procedure performed using ultrasound guided technique. Ultrasound Notes:anatomy identified, needle tip was noted to be adjacent to the nerve/plexus identified, no ultrasound evidence of intravascular and/or intraneural injection and image(s) printed for medical record Attempts: 1 Following insertion, dressing applied and line sutured. Post procedure assessment: blood return through all ports  Patient tolerated the procedure well with no immediate complications.

## 2018-08-07 NOTE — Progress Notes (Signed)
-  No acute issues overnight.  No further bleeding episodes.  Awaiting TIPS placement today.  We will follow-up tomorrow.  Otis Brace MD, [place] 08/07/2018, 11:38 AM  Contact #  [phone]

## 2018-08-07 NOTE — Procedures (Signed)
Interventional Radiology Procedure Note  Procedure:  US guided right IJ acces.  US guided trans hepatic portal access.  TIPS creation. 10x8cm gore viator, post PTA to 28m.  Gradient initial: 252mg Gradient final: 1394m  Coil embo of left gastric vein with [id number]   Complications: None  Recommendations:  - Stable to PACU with anesthesia - Routine wound care at the right IJ access and right abd access - VIR to follow - agree with lactulose - observe for encephalopathhy   Signed,  JaiDulcy FannyagEarleen NewportO

## 2018-08-07 NOTE — Transfer of Care (Signed)
Immediate Anesthesia Transfer of Care Note  Patient: Crystal [name]  Procedure(s) Performed: TIPS (N/A )  Patient Location: PACU  Anesthesia Type:General  Level of Consciousness: awake and alert   Airway & Oxygen Therapy: Patient Spontanous Breathing and Patient connected to nasal cannula oxygen  Post-op Assessment: Report given to RN, Post -op Vital signs reviewed and stable and Patient moving all extremities  Post vital signs: Reviewed and stable  Last Vitals:  Vitals Value Taken Time  BP 139/66 08/07/2018  5:50 PM  Temp    Pulse 72 08/07/2018  5:59 PM  Resp 12 08/07/2018  5:59 PM  SpO2 95 % 08/07/2018  5:59 PM  Vitals shown include unvalidated device data.  Last Pain:  Vitals:   08/07/18 0848  TempSrc:   PainSc: 0-No pain      Patients Stated Pain Goal: 0 (76/28/31 5176)  Complications: No apparent anesthesia complications

## 2018-08-07 NOTE — Sedation Documentation (Signed)
Report given to [place], RN, IR, RN.

## 2018-08-07 NOTE — Progress Notes (Signed)
PROGRESS NOTE    Crystal [name]  YIR:[id number] DOB: 11-26-1950 DOA: 08/03/2018 PCP: Crystal Market, MD    Brief Narrative:  67 year old female who presented with rectal bleeding.  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  She complain of abdominal pain, associated with hematochezia.  On her initial physical examination blood pressure 123/73, her lungs are clear to auscultation bilaterally, heart S1-S2 present rhythmic, the abdomen was soft nontender, no lower extremity edema.  Patient was admitted to the hospital with the working diagnosis of acute blood loss anemia due to upper GI bleed in the setting of esophageal varices due to cirrhosis.  Assessment & Plan:   Active Problems:   Upper GI bleeding   Acute upper gastrointestinal bleeding   1. Acute upper GI bleed, variceal bleeding with acute blood loss anemia. Patient had upper endoscopy, unable to band varices due to previous scarring. Positive signs of bleeding. Patient tolerated well prbc transfusion, Hb 8,5 and hct at 27,9, continue octreotide and IV proton pump inhibitors. For TIPS today.   2. Liver cirrhosis due to NASH. Will start patient on lactulose to prevent encephalopathy, continue neuro checks per unit protocol. Continue propranolol.   3. T2DM. Fasting glucose 120, will continue close monitoring. Encourage po intake. Hold on insulin therapy for now.   4. Post-operative respiratory failure. Continue oxymetry monitoring and aspiration precautions.    DVT prophylaxis: scd   Code Status:  full Family Communication: no family at the bedside  Disposition Plan/ discharge barriers: pending TIPS and physical therapy.    Consultants:   GI  IR   Procedures:   EGD   TIPS  Antimicrobials:      Subjective: Patient post TIPS in pacu, positive abdominal pain, improved with IV analgesics, no nausea or vomiting.   Objective: Vitals:   08/06/18 1355  08/06/18 2029 08/07/18 0432 08/07/18 1233  BP: (!) 115/54 133/64 131/62   Pulse: (!) 54 (!) 58 (!) 58   Resp: _0 Temp: 98.1 F (36.7 C) 98.6 F (37 C) 97.9 F (36.6 C)   TempSrc: Oral Oral Oral   SpO2: 98% 96% 94%   Weight:    80.3 kg  Height:    5' 2.99" (1.6 m)    Intake/Output Summary (Last 24 hours) at 08/07/2018 1507 Last data filed at 08/07/2018 0500 Gross per 24 hour  Intake 1071.78 ml  Output -  Net 1071.78 ml   Filed Weights   08/04/18 0446 08/05/18 0515 08/07/18 1233  Weight: 82.2 kg 80.3 kg 80.3 kg    Examination:   General: deconditioned  Neurology: Awake and alert, non focal  E ENT: no pallor, no icterus, oral mucosa moist Cardiovascular: No JVD. S1-S2 present, rhythmic, no gallops, rubs, or murmurs. Trace lower extremity edema. Pulmonary: positive breath sounds bilaterally, adequate air movement, no wheezing, rhonchi or rales. Gastrointestinal. Abdomen distended and tender, no organomegaly, no rebound or guarding Skin. No rashes Musculoskeletal: no joint deformities     Data Reviewed: I have personally reviewed following labs and imaging studies  CBC: Recent Labs  Lab 08/04/18 2140 08/05/18 0445 08/05/18 1525 08/06/18 0601 08/07/18 0442  WBC 4.3 3.2* 4.7 3.0* 3.4*  NEUTROABS 2.3 1.9  --  2.0 1.9  HGB 7.8* 7.3* 8.0* 7.6* 8.5*  HCT 25.5* 23.6* 26.5* 25.2* 27.9*  MCV 87.0 87.1 88.9 90.6 89.4  PLT 112* 120* 119* PLATELET CLUMPS NOTED ON SMEAR, UNABLE TO ESTIMATE 116*   Basic  Metabolic Panel: Recent Labs  Lab 08/03/18 0341 08/03/18 0548 08/04/18 0242 08/05/18 0445 08/05/18 0855 08/06/18 0601 08/07/18 0442  NA 137  --  138 142  --  142 141  K 4.4  --  4.1 3.5  --  3.5 3.8  CL 106  --  110 114*  --  113* 114*  CO2 22  --  18* 21*  --  21* 17*  GLUCOSE 145*  --  184* 136*  --  135* 120*  BUN 22  --  25* 23  --  13 9  CREATININE 0.69  --  0.89 0.89  --  0.78 0.80  CALCIUM 9.1  --  8.1* 8.3*  --  8.5* 8.3*  MG  --  1.6*  --   --   1.9  --   --   PHOS  --  2.8  --   --   --   --   --    GFR: Estimated Creatinine Clearance: 69.5 mL/min (by C-G formula based on SCr of 0.8 mg/dL). Liver Function Tests: Recent Labs  Lab 08/02/18 2212 08/05/18 0445  AST 105* 66*  ALT 79* 60*  ALKPHOS 216* 133*  BILITOT 0.7 0.9  PROT 7.1 5.5*  ALBUMIN 3.0* 2.5*   No results for input(s): LIPASE, AMYLASE in the last 168 hours. No results for input(s): AMMONIA in the last 168 hours. Coagulation Profile: Recent Labs  Lab 08/02/18 2212 08/03/18 0548 08/05/18 0855  INR 1.09 1.16 1.14   Cardiac Enzymes: No results for input(s): CKTOTAL, CKMB, CKMBINDEX, TROPONINI in the last 168 hours. BNP (last 3 results) No results for input(s): PROBNP in the last 8760 hours. HbA1C: No results for input(s): HGBA1C in the last 72 hours. CBG: Recent Labs  Lab 08/03/18 0437 08/03/18 1158 08/03/18 1528 08/05/18 2149  GLUCAP 95 163* 159* 135*   Lipid Profile: No results for input(s): CHOL, HDL, LDLCALC, TRIG, CHOLHDL, LDLDIRECT in the last 72 hours. Thyroid Function Tests: No results for input(s): TSH, T4TOTAL, FREET4, T3FREE, THYROIDAB in the last 72 hours. Anemia Panel: No results for input(s): VITAMINB12, FOLATE, FERRITIN, TIBC, IRON, RETICCTPCT in the last 72 hours.    Radiology Studies: I have reviewed all of the imaging during this hospital visit personally     Scheduled Meds: . sodium chloride   Intravenous Once  . [MAR Hold] pantoprazole  40 mg Intravenous Q12H  . [MAR Hold] propranolol  20 mg Oral BID   Continuous Infusions: . [MAR Hold] sodium chloride    . ceFAZolin    . lactated ringers 10 mL/hr at 08/07/18 1239  . octreotide  (SANDOSTATIN)    IV infusion 50 mcg/hr (08/07/18 1200)     LOS: 4 days        [name] Gerome Apley, MD [facility] Pager [phone]

## 2018-08-07 NOTE — Anesthesia Procedure Notes (Signed)
Procedure Name: Intubation Date/Time: 08/07/2018 2:26 PM Performed by: Hulon Ferron T, CRNA Pre-anesthesia Checklist: Patient identified, Emergency Drugs available, Suction available and Patient being monitored Patient Re-evaluated:Patient Re-evaluated prior to induction Oxygen Delivery Method: Circle system utilized Preoxygenation: Pre-oxygenation with 100% oxygen Induction Type: IV induction Ventilation: Mask ventilation without difficulty Laryngoscope Size: Miller and 2 Grade View: Grade I Tube type: Oral Tube size: 7.5 mm Number of attempts: 1 Airway Equipment and Method: Patient positioned with wedge pillow and Stylet Placement Confirmation: ETT inserted through vocal cords under direct vision,  positive ETCO2 and breath sounds checked- equal and bilateral Secured at: 21 cm Tube secured with: Tape Dental Injury: Teeth and Oropharynx as per pre-operative assessment

## 2018-08-07 NOTE — Anesthesia Procedure Notes (Signed)
Arterial Line Insertion Start/End9/25/2019 1:15 PM Performed by: White, Amedeo Plenty, CRNA, CRNA  Lidocaine 1% used for infiltration Left, radial was placed Catheter size: 20 G Hand hygiene performed  and maximum sterile barriers used  Allen's test indicative of satisfactory collateral circulation Attempts: 2 Procedure performed without using ultrasound guided technique. Following insertion, dressing applied and Biopatch. Post procedure assessment: normal  Patient tolerated the procedure well with no immediate complications.

## 2018-08-08 ENCOUNTER — Encounter (HOSPITAL_COMMUNITY): Payer: Self-pay | Admitting: Interventional Radiology

## 2018-08-08 LAB — CBC WITH DIFFERENTIAL/PLATELET
Abs Immature Granulocytes: 0 10*3/uL (ref 0.0–0.1)
Basophils Absolute: 0 10*3/uL (ref 0.0–0.1)
Basophils Relative: 0 %
Eosinophils Absolute: 0 10*3/uL (ref 0.0–0.7)
Eosinophils Relative: 0 %
HCT: 27.9 % — ABNORMAL LOW (ref 36.0–46.0)
Hemoglobin: 8.4 g/dL — ABNORMAL LOW (ref 12.0–15.0)
Immature Granulocytes: 1 %
Lymphocytes Relative: 13 %
Lymphs Abs: 0.7 10*3/uL (ref 0.7–4.0)
MCH: 27.2 pg (ref 26.0–34.0)
MCHC: 30.1 g/dL (ref 30.0–36.0)
MCV: 90.3 fL (ref 78.0–100.0)
Monocytes Absolute: 0.2 10*3/uL (ref 0.1–1.0)
Monocytes Relative: 4 %
Neutro Abs: 4.3 10*3/uL (ref 1.7–7.7)
Neutrophils Relative %: 82 %
Platelets: 138 10*3/uL — ABNORMAL LOW (ref 150–400)
RBC: 3.09 MIL/uL — ABNORMAL LOW (ref 3.87–5.11)
RDW: 20.3 % — ABNORMAL HIGH (ref 11.5–15.5)
WBC: 5.2 10*3/uL (ref 4.0–10.5)

## 2018-08-08 MED ORDER — PANTOPRAZOLE SODIUM 40 MG PO TBEC
40.0000 mg | DELAYED_RELEASE_TABLET | Freq: Two times a day (BID) | ORAL | Status: DC
  Administered 2018-08-08 – 2018-08-10 (×4): 40 mg via ORAL
  Filled 2018-08-08 (×4): qty 1

## 2018-08-08 MED ORDER — SODIUM CHLORIDE 0.9 % IV BOLUS
500.0000 mL | Freq: Once | INTRAVENOUS | Status: AC
  Administered 2018-08-08: 17:00:00 via INTRAVENOUS

## 2018-08-08 NOTE — Plan of Care (Signed)
Nutrition Education Note  RD consulted for nutrition education regarding cirrhosis/ low sodium diet.  Pt admitted with acute variceal upper GIB.in the setting of liver cirrhosis.   9/21- extubated 9/25- s/p US guided rt IJ access, US guided trans hepatic portal access with TIPS creation  Last Hgb A1c: 5.6 (03/15/18). PTA DM medications are 0-9 units insulin aspart every 6 hours, 1000 mg metformin BID.   Labs reviewed: CBGS: 102 (inpatient orders for glycemic control are none).    Case discussed with RN prior to visit. Pt was just advanced to solid foods and is eager to eat.  Spoke with pt, daughter, and grandson at bedside. Pt and daughter reports great appetite at home; PTA pt consumed 3 meals per day (Breakfast: oatmeal and fruit; Lunch: sandwich; Dinner: meat, starch, and vegetable). Pt does not like cooking so mainly eats "simple" foods. Daughter assists with meal preparation. Pt endorses following a low sodium diet at home, but does admit to eating hot dogs occasionally. Per daughter, pt goes out to brunch weekly.   Pt denies any weight loss. She reports good mobility and just ambulated hallways earlier this morning.   Per daughter, pt with no nutrition problems. Pt requesting "a food list about what I am supposed to eat". RD reviewed low NA diet guidelines.   Nutrition-Focused physical exam completed. Findings are no fat depletion, no muscle depletion, and mild edema.    RD provided "Cirrhosis Nutrition Therapy" handout from the [employer]. Reviewed patient's dietary recall. Provided examples on ways to decrease sodium intake in diet. Discouraged intake of processed foods and use of salt shaker. Encouraged fresh fruits and vegetables as well as whole grain sources of carbohydrates to maximize fiber intake.   RD discussed why it is important for patient to adhere to diet recommendations, and emphasized the role of fluids, foods to avoid. Teach back method  used.  Expect fair compliance.  Body mass index is 31.37 kg/m. Pt meets criteria for obesity, class I based on current BMI.  Current diet order is 2 gram sodium, patient is consuming approximately 75-100% of meals at this time. Labs and medications reviewed. No further nutrition interventions warranted at this time. RD contact information provided. If additional nutrition issues arise, please re-consult RD.   Crystal [name], RD, LDN, CDE Pager: [phone] After hours Pager: [phone]

## 2018-08-08 NOTE — Evaluation (Signed)
Physical Therapy Evaluation Patient Details Name: Crystal [name] MRN: [id number] DOB: [birth year] Today's Date: 08/08/2018   History of Present Illness  Pt is a 67 y/o female admitted secondary to upper GI bleed. Pt is s/p TIPD procedure on 9/26. PMH includes hepatitis C, DM, and GI bleed.   Clinical Impression  Pt admitted secondary to problem above with deficits below. Pt requiring min guard to supervision A for mobility using RW. Pt reports increased comfort with use of RW and requesting one for home. Pt reports she does not feel like she will need follow up PT. Will continue to follow acutely to maximize functional mobility independence and safety.     Follow Up Recommendations No PT follow up(Pt refusing HHPT )    Equipment Recommendations  Rolling walker with 5" wheels    Recommendations for Other Services       Precautions / Restrictions Precautions Precautions: None Restrictions Weight Bearing Restrictions: No      Mobility  Bed Mobility               General bed mobility comments: In chair upon entry  Transfers Overall transfer level: Needs assistance Equipment used: Rolling walker (2 wheeled) Transfers: Sit to/from Stand Sit to Stand: Supervision         General transfer comment: Supervision for safety. Increased time required.   Ambulation/Gait Ambulation/Gait assistance: Supervision;Min guard Gait Distance (Feet): 250 Feet Assistive device: Rolling walker (2 wheeled) Gait Pattern/deviations: Step-through pattern;Decreased stride length Gait velocity: Decreased    General Gait Details: Slow, cautious gait, however, overall steady using RW. Pt reports increased comfort with use of RW and requesting one for home. Min guard to supervision for safety.   Stairs            Wheelchair Mobility    Modified Rankin (Stroke Patients Only)       Balance Overall balance assessment: Needs assistance Sitting-balance support: No upper extremity  supported;Feet supported Sitting balance-Leahy Scale: Good     Standing balance support: Bilateral upper extremity supported;During functional activity Standing balance-Leahy Scale: Poor Standing balance comment: Reliant on BUE support.                              Pertinent Vitals/Pain Pain Assessment: No/denies pain    Home Living Family/patient expects to be discharged to:: Private residence Living Arrangements: Children Available Help at Discharge: Family;Available 24 hours/day Type of Home: House Home Access: Ramped entrance     Home Layout: One level Home Equipment: None      Prior Function Level of Independence: Independent               Hand Dominance        Extremity/Trunk Assessment   Upper Extremity Assessment Upper Extremity Assessment: Overall WFL for tasks assessed    Lower Extremity Assessment Lower Extremity Assessment: Generalized weakness    Cervical / Trunk Assessment Cervical / Trunk Assessment: Normal  Communication   Communication: No difficulties  Cognition Arousal/Alertness: Awake/alert Behavior During Therapy: WFL for tasks assessed/performed Overall Cognitive Status: Within Functional Limits for tasks assessed                                        General Comments General comments (skin integrity, edema, etc.): Pt's daughter present during session. Pt reporting that she does not feel like she  will need PT at follow up.     Exercises     Assessment/Plan    PT Assessment Patient needs continued PT services  PT Problem List Decreased strength;Decreased mobility;Decreased knowledge of use of DME       PT Treatment Interventions DME instruction;Gait training;Stair training;Functional mobility training;Therapeutic activities;Therapeutic exercise;Patient/family education    PT Goals (Current goals can be found in the Care Plan section)  Acute Rehab PT Goals Patient Stated Goal: to go home  PT  Goal Formulation: With patient Time For Goal Achievement: 08/22/18 Potential to Achieve Goals: Good    Frequency Min 3X/week   Barriers to discharge        Co-evaluation               AM-PAC PT "6 Clicks" Daily Activity  Outcome Measure Difficulty turning over in bed (including adjusting bedclothes, sheets and blankets)?: A Little Difficulty moving from lying on back to sitting on the side of the bed? : A Little Difficulty sitting down on and standing up from a chair with arms (e.g., wheelchair, bedside commode, etc,.)?: Unable Help needed moving to and from a bed to chair (including a wheelchair)?: A Little Help needed walking in hospital room?: A Little Help needed climbing 3-5 steps with a railing? : A Little 6 Click Score: 16    End of Session Equipment Utilized During Treatment: Gait belt Activity Tolerance: Patient tolerated treatment well Patient left: in chair;with call bell/phone within reach;with family/visitor present Nurse Communication: Mobility status PT Visit Diagnosis: Other abnormalities of gait and mobility (R26.89);Muscle weakness (generalized) (M62.81)    Time: 8588-5027 PT Time Calculation (min) (ACUTE ONLY): 16 min   Charges:   PT Evaluation $PT Eval Low Complexity: [place], PT, DPT  Acute Rehabilitation Services  Pager: [phone] Office: [phone]   Rudean Hitt 08/08/2018, 6:14 PM

## 2018-08-08 NOTE — Progress Notes (Addendum)
PROGRESS NOTE    Crystal [name]  TDV:[id number] DOB: 12-13-1950 DOA: 08/03/2018 PCP: Lorelee Market, MD    Brief Narrative:  67 year old female who presented with rectal bleeding.  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  She complain of abdominal pain, associated with hematochezia.  On her initial physical examination blood pressure 123/73, her lungs are clear to auscultation bilaterally, heart S1-S2 present rhythmic, the abdomen was soft nontender, no lower extremity edema.  Sodium 137, potassium 4.4, chloride 106, bicarb 22, glucose 145, BUN 22, creatinine 0.69, white cell count 7.0, hemoglobin 9.0, hematocrit 27.0, platelets 198.  Chest radiograph of the right lower lobe pulmonary nodule.  CT of the abdomen with small varices in the distal esophagus and GE junction, associated with left gastric vein.  Portal venous system is patent.  Cirrhotic changes with portal hypertension.  Positive for esophageal varices, ascites and splenomegaly.  No significant change in right lower lobe pulmonary nodule.  EKG sinus rhythm, normal axis, normal intervals, low voltage, poor R wave progression.  Patient was admitted to the hospital with the working diagnosis of acute blood loss anemia due to upper GI bleed in the setting of esophageal varices due to cirrhosis.   Assessment & Plan:   Active Problems:   Upper GI bleeding   Acute upper gastrointestinal bleeding  1. Acute upper GI bleed, variceal bleeding with acute blood loss anemia. SP upper endoscopy, unable to band varices due to previous scarring. Hb down to 8,4 from 8,7, sp TIPS to decrease portal pressure. Blood pressure has been low with systolic 94 to 06YIRS, heart rate down to 54. Will hold on propranolol for now, and will give bolus on 500 cc NS. Continue pantoprazole. Octreotide has been discontinued. Check CBC. Patient continue to be high risk for bleeding.   2. Liver cirrhosis due  to NASH. Continue lactulose to prevent encephalopathy, titrate to 2 to 3 bowel movements per day. Neuro checks per unit protocol.   3. T2DM. Capillary glucose 135 to 102. Holding on insulin therapy. Diet has been advanced per GI recommendations.   4. Post-operative respiratory failure. No dyspnea today, will continue oxymetry monitoring.   DVT prophylaxis: scd   Code Status:  full Family Communication: I spoke with patient's family at the bedside and all questions were addressed.  Disposition Plan/ discharge barriers: pending TIPS and physical therapy.    Consultants:   GI  IR   Procedures:   EGD   TIPS  Antimicrobials:   Subjective: Patient with improved abdominal pain at the right upper quadrant, not back to baseline, has been npo overnight, no nausea or vomiting. No chest pain or dyspnea.   Objective: Vitals:   08/07/18 2154 08/08/18 0255 08/08/18 0627 08/08/18 1109  BP: (!) 129/59 (!) 118/55 (!) 103/57 (!) 94/45  Pulse: 62 62 65 (!) 57  Resp: _0 Temp: 98.6 F (37 C) 97.6 F (36.4 C) 98.2 F (36.8 C) 98.1 F (36.7 C)  TempSrc: Oral Oral Oral Oral  SpO2: 97% 97% 98% 98%  Weight:      Height:        Intake/Output Summary (Last 24 hours) at 08/08/2018 1244 Last data filed at 08/08/2018 1151 Gross per 24 hour  Intake 3308.52 ml  Output 4500 ml  Net -1191.48 ml   Filed Weights   08/04/18 0446 08/05/18 0515 08/07/18 1233  Weight: 82.2 kg 80.3 kg 80.3 kg    Examination:  General: deconditioned  Neurology: Awake and alert, non focal  E ENT: mild pallor, no icterus, oral mucosa moist Cardiovascular: No JVD. S1-S2 present, rhythmic, no gallops, rubs, or murmurs. No lower extremity edema. Pulmonary: positive breath sounds bilaterally, adequate air movement, no wheezing, rhonchi or rales. Gastrointestinal. Abdomen mild distention with no organomegaly, no rebound or guarding. Tender to palpation on the right upper quadrant.  Skin. No  rashes Musculoskeletal: no joint deformities     Data Reviewed: I have personally reviewed following labs and imaging studies  CBC: Recent Labs  Lab 08/04/18 2140 08/05/18 0445 08/05/18 1525 08/06/18 0601 08/07/18 0442 08/07/18 2030 08/08/18 0337  WBC 4.3 3.2* 4.7 3.0* 3.4*  --  5.2  NEUTROABS 2.3 1.9  --  2.0 1.9  --  4.3  HGB 7.8* 7.3* 8.0* 7.6* 8.5* 8.7* 8.4*  HCT 25.5* 23.6* 26.5* 25.2* 27.9* 29.1* 27.9*  MCV 87.0 87.1 88.9 90.6 89.4  --  90.3  PLT 112* 120* 119* PLATELET CLUMPS NOTED ON SMEAR, UNABLE TO ESTIMATE 116*  --  601*   Basic Metabolic Panel: Recent Labs  Lab 08/03/18 0341 08/03/18 0548 08/04/18 0242 08/05/18 0445 08/05/18 0855 08/06/18 0601 08/07/18 0442  NA 137  --  138 142  --  142 141  K 4.4  --  4.1 3.5  --  3.5 3.8  CL 106  --  110 114*  --  113* 114*  CO2 22  --  18* 21*  --  21* 17*  GLUCOSE 145*  --  184* 136*  --  135* 120*  BUN 22  --  25* 23  --  13 9  CREATININE 0.69  --  0.89 0.89  --  0.78 0.80  CALCIUM 9.1  --  8.1* 8.3*  --  8.5* 8.3*  MG  --  1.6*  --   --  1.9  --   --   PHOS  --  2.8  --   --   --   --   --    GFR: Estimated Creatinine Clearance: 69.5 mL/min (by C-G formula based on SCr of 0.8 mg/dL). Liver Function Tests: Recent Labs  Lab 08/02/18 2212 08/05/18 0445  AST 105* 66*  ALT 79* 60*  ALKPHOS 216* 133*  BILITOT 0.7 0.9  PROT 7.1 5.5*  ALBUMIN 3.0* 2.5*   No results for input(s): LIPASE, AMYLASE in the last 168 hours. No results for input(s): AMMONIA in the last 168 hours. Coagulation Profile: Recent Labs  Lab 08/02/18 2212 08/03/18 0548 08/05/18 0855  INR 1.09 1.16 1.14   Cardiac Enzymes: No results for input(s): CKTOTAL, CKMB, CKMBINDEX, TROPONINI in the last 168 hours. BNP (last 3 results) No results for input(s): PROBNP in the last 8760 hours. HbA1C: No results for input(s): HGBA1C in the last 72 hours. CBG: Recent Labs  Lab 08/03/18 0437 08/03/18 1158 08/03/18 1528 08/05/18 2149  08/07/18 1749  GLUCAP 95 163* 159* 135* 102*   Lipid Profile: No results for input(s): CHOL, HDL, LDLCALC, TRIG, CHOLHDL, LDLDIRECT in the last 72 hours. Thyroid Function Tests: No results for input(s): TSH, T4TOTAL, FREET4, T3FREE, THYROIDAB in the last 72 hours. Anemia Panel: No results for input(s): VITAMINB12, FOLATE, FERRITIN, TIBC, IRON, RETICCTPCT in the last 72 hours.    Radiology Studies: I have reviewed all of the imaging during this hospital visit personally     Scheduled Meds: . sodium chloride   Intravenous Once  . lactulose  20 g Oral TID  . pantoprazole  40 mg Oral BID  . propranolol  20 mg Oral BID   Continuous Infusions: . sodium chloride    . lactated ringers 10 mL/hr at 08/07/18 1239     LOS: 5 days        Tawni Millers, MD [facility] Pager [phone]

## 2018-08-08 NOTE — Progress Notes (Signed)
Notified NP regarding transfusion order for today, not being transfused. Stat H&H ordered. Hgb 8.7, made NP aware. No new orders at this time.

## 2018-08-08 NOTE — Progress Notes (Signed)
Referring Physician(s): Vanga,R  Supervising Physician: Sandi Mariscal  Patient Status:  [facility] - In-pt  Chief Complaint: Cirrhosis, esophageal variceal bleed   Subjective: Pt doing ok; has some mild RUQ discomfort but denies fever,HA, CP, dyspnea, back pain,N/V or bleeding; she is not confused   Allergies: Patient has no known allergies.  Medications: Prior to Admission medications   Medication Sig Start Date End Date Taking? Authorizing Provider  cyanocobalamin (,VITAMIN B-12,) 1000 MCG/ML injection Inject 1,000 mcg into the muscle once.   Yes [provider]  docusate sodium (COLACE) 100 MG capsule Take 100 mg by mouth daily.   Yes [provider]  furosemide (LASIX) 20 MG tablet Take 2 tablets (40 mg total) by mouth daily. 05/24/18 11/20/18 Yes Vanga, Tally Due, MD  metFORMIN (GLUCOPHAGE) 500 MG tablet Take 1,000 mg by mouth 2 (two) times daily with a meal.    Yes [provider]  nystatin (MYCOSTATIN/NYSTOP) powder Apply 1 g topically as needed (rash).  05/23/18  Yes [provider]  omeprazole (PRILOSEC) 40 MG capsule Take 1 capsule (40 mg total) by mouth 2 (two) times daily before a meal. 05/24/18 11/20/18 Yes Vanga, Tally Due, MD  ondansetron (ZOFRAN) 4 MG tablet Take 1 tablet (4 mg total) by mouth every 6 (six) hours as needed for nausea. 08/03/18  Yes Rosine Door, MD  propranolol (INDERAL) 20 MG tablet Take 1 tablet (20 mg total) by mouth 2 (two) times daily. 02/14/18 08/13/18 Yes Vanga, Tally Due, MD  spironolactone (ALDACTONE) 50 MG tablet Take 2 tablets (100 mg total) by mouth daily. 05/24/18 11/20/18 Yes Vanga, Tally Due, MD     Vital Signs: BP (!) 103/57 (BP Location: Right Arm)   Pulse 65   Temp 98.2 F (36.8 C) (Oral)   Resp 16   Ht 5' 2.99" (1.6 m)   Wt 177 lb 0.5 oz (80.3 kg)   SpO2 98%   BMI 31.37 kg/m   Physical Exam awake/alert; right IJ site mildly tender, no def hematoma, left IJ venous cath intact; abd soft,  mild RUQ tenderness,+BS; no LE edema  Imaging: Dg Chest 1 View  Result Date: 08/07/2018 CLINICAL DATA:  Bedside central venous catheter placement. EXAM: Portable CHEST 1 VIEW COMPARISON:  08/04/2018, 01/07/2015 and CT chest 05/15/2018. FINDINGS: LEFT jugular central venous catheter tip projects over the expected location of the UPPER SVC with its tip directed laterally. No evidence of pneumothorax or mediastinal hematoma. Suboptimal inspiration accounts for mild bibasilar atelectasis. Cardiac silhouette mildly enlarged, unchanged. Pulmonary venous hypertension and perhaps minimal interstitial pulmonary edema as there are scattered Kerley B lines. IMPRESSION: 1. LEFT jugular central venous catheter tip projects over the UPPER SVC with its tip directed laterally. No acute complicating features. 2. Possible minimal CHF and/or fluid overload. Electronically Signed   By: Evangeline Dakin M.D.   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 Patient is being evaluated for TIPS procedure. EXAM: CT ABDOMEN AND PELVIS WITH CONTRAST TECHNIQUE: Multidetector CT imaging of the abdomen and pelvis was performed using the standard protocol following bolus administration of intravenous contrast. CONTRAST:  174m OMNIPAQUE IOHEXOL 300 MG/ML  SOLN COMPARISON:  Chest CT 05/15/2018 and abdominal CT 02/09/2018 FINDINGS: Lower chest: Again noted is a lesion in the right lower lobe measuring roughly 1.7 cm and minimally changed from the most recent CT. No large pleural effusions. Hepatobiliary: Perihepatic ascites. Liver appears to  be mildly nodular and similar to the prior examination. Mild distention of the gallbladder with a calcified stone. Punctate low-density structure at the hepatic dome is unchanged and suggestive for a small cyst. No suspicious hepatic lesions. Main portal vein, right portal vein and left portal vein are  patent. No biliary dilatation. Pancreas: Unremarkable. No pancreatic ductal dilatation or surrounding inflammatory changes. Spleen: Stable enlargement of the spleen. Small amount of ascites in left upper abdomen. Adrenals/Urinary Tract: Normal adrenal glands. Normal appearance of both kidneys without hydronephrosis. No suspicious renal lesions. Gas in the urinary bladder may be iatrogenic. No evidence for bladder wall thickening. Stomach/Bowel: No gross abnormality to the stomach, small bowel or colon. No acute bowel inflammatory changes. Vascular/Lymphatic: Atherosclerotic calcifications at the right renal artery origin. At least mild narrowing at the origin of the right renal artery. Main visceral arteries are patent. Negative for an aortic aneurysm. Iliac veins are patent. IVC is patent. Small varices involving the distal esophagus and GE junction associated with the left gastric or coronary vein. Few small gastric varices which are probably involving the short gastric veins. Enlarged lymph nodes throughout the gastrohepatic ligament and upper abdomen are similar to the prior abdominal CT and likely secondary to cirrhosis. Index lymph node near the porta hepatis measures 2.1 cm on sequence 6 image 22 and stable. Again noted is lymph node enlargement in the central abdominal mesentery. Reproductive: Evidence for tubal ligation. Again noted is a large low-density structure involving the left ovary/adnexa that measures roughly 7 cm and similar to the CT from 02/09/2018. this was a simple cyst on ultrasound from 03/18/2018. Other: Small amount of upper abdominal ascites. No significant pelvic ascites. Diffuse subcutaneous edema. Negative for free air. Musculoskeletal: Anterolisthesis at L4-L5 is likely secondary to facet arthropathy. IMPRESSION: 1. Small varices in the distal esophagus and GE junction associated with the left gastric vein. Portal venous system is patent. Cirrhotic changes with portal hypertension  demonstrated by esophageal varices, ascites and splenomegaly. 2. Stable abdominal lymphadenopathy which is likely secondary to cirrhosis. 3. No significant change in the right lower lobe pulmonary nodule. 4. Gas within the urinary bladder is nonspecific and could be iatrogenic. 5. No change in the large left adnexal/ovarian cyst. 6. Cholelithiasis. Electronically Signed   By: Markus Daft M.D.   On: 08/05/2018 17:09   Ir Tips  Result Date: 08/08/2018 CLINICAL DATA:  67 year old female with cirrhosis and portal hypertension with recurrent bleeding esophageal varices INDICATION: Recurrent bleeding esophageal varices EXAM: ULTRASOUND GUIDED ACCESS RIGHT JUGULAR VEIN FOR TRANSJUGULAR INTRAHEPATIC PORTOSYSTEMIC SHUNT PLACEMENT ULTRASOUND-GUIDED PERCUTANEOUS NEEDLE PLACEMENT, TRANSHEPATIC PORTAL VENOUS ACCESS COIL EMBOLIZATION OF LEFT GASTRIC VEIN AS MAIN CONTRIBUTION TO ESOPHAGEAL VARICEAL SYSTEM MEDICATIONS: As antibiotic prophylaxis, 2 g Ancef was ordered pre-procedure and administered intravenously within one hour of incision. One unit of platelets was also administered IV during the procedure. ANESTHESIA/SEDATION: General - as administered by the Anesthesia department CONTRAST:  80 cc Omnipaque FLUOROSCOPY TIME:  Fluoroscopy Time: 42 minutes 18 seconds (761 mGy). COMPLICATIONS: None PROCEDURE: Informed written consent was obtained from the patient and the patient's family after a thorough discussion of the procedural risks, benefits and alternatives. Specific risks discussed with TIPS/variceal embolization included: Bleeding, infection, vascular injury, need for further procedure/surgery, renal injury/renal failure, contrast reaction, non-target embolization, liver dysfunction/failure, hepatic encephalopathy, stroke (~1%), cardiopulmonary collapse, death. All questions were addressed. Maximal Sterile Barrier Technique was utilized including caps, mask, sterile gowns, sterile gloves, sterile drape, hand hygiene and  skin antiseptic. A timeout was performed  prior to the initiation of the procedure. Patient was positioned supine position on the table, with the right upper quadrant and the right neck prepped and draped in the usual sterile fashion. General anesthesia was initiated with anesthesia team. Ultrasound survey was then performed of the right neck, with images stored and sent to PACs. Ultrasound guidance was then used to access the right internal jugular vein with a micro puncture kit. The wire was advanced under fluoroscopy into the right atrium, and a small incision was made. The needle was removed and the dilator was placed. The micro wire in the stiffener were removed and an 035 wire was then passed into the inferior vena cava. Twelve French soft tissue dilation was performed on the wire, and then 10 [place] TIPS sheath was placed. Combination of Benson wire and multipurpose angled catheter were used to select the right hepatic vein. During the selection the right hepatic vein, accessory hepatic vein was accessed with venography. Venogram confirmed location within the right hepatic vein. Tip sheath was then advanced over the catheter with a coaxial Amplatz wire, for a position cephalad to the transition point of the wire in the portal venous system. The Col[place]into needle was then advanced through the TIPS sheath housed in the Teflon sheath over the Amplatz wire. The needle was then advanced into the patent parenchyma, and CO2 portography was performed with AP and right anterior oblique positions. The CO2 portography was used to estimate location of portal vein. After 3 attempts at passing the goal of into the needle into the portal vein, the needle was withdrawn from the wire with the wire remaining in place. Ultrasound survey of the right upper quadrant was performed, with images stored and sent to PACs. Using ultrasound guidance, Chiba needle was used access the right portal system. Once we confirmed position in the  portal system with portal venography, micro wire was advanced into the inferior mesenteric vein. The inner dilator was advanced over the metal stiffener into the portal system, with the inner dilator advanced over the wire. Venogram/portography confirmed are location within the portal system. The micro wire was then replaced into the inner dilator, with the transition point position at the apex of the catheter. We then replaced the TIPS sheath into the right hepatic vein with the use of cobra catheter and a Bentson wire. Bentson wire was exchanged for Amplatz wire and the Teflon housing and the colapinto needle were again advanced into the right hepatic vein. Approximately 3 more attempts were unsuccessful. Needle was removed and accentuated needle curve was placed at the tip of the needle. Approximately 3 more attempts were made to gain access into the portal vein. Britta Mccreedy wire was then passed through the Colapinto needle into the right portal vein. The Cobra catheter was then advanced through the hepatic tissue into the portal vein with the angle used to direct the Bentson wire into the main portal vein. Once the wire was in the main portal vein, the Cobra catheter was removed over an Amplatz wire. Soft tissue tract dilation was then performed using a 6 mm x 4 cm standard balloon angioplasty. Upon deflation of the final balloon angioplasty the tip sheath was placed over the balloon into the portal vein. Pigtail marking catheter was then advanced over the wire for portal venogram. Estimated length of the tissue tract was then performed with a marking pigtail catheter and we selected a 10 mm diameter by 80 mm length via tore stent graft. A baseline portal pressure was  also measured through the pigtail catheter, estimated 25 mm mercury. Pigtail catheter was removed over an Amplatz wire. The selected stent graft was then placed through the tissue tract and sheath, into the portal vein. Partial deployment of the un  covered stent was performed with withdrawal to the tissue tract. Sheath was then withdrawn. After deployment of the stent graft, 9 mm balloon angioplasty was performed along the length of the stent graft. Repeat portal pressures performed with a gradient of 14 mm-15 mm mercury. 10 mm balloon angioplasty was then performed along the length of the stent graft. Repeat pressures were performed estimating a final pressure of 13 mm mercury. Pigtail catheter was removed on a Bentson wire. Using a combination of Bentson wire, cobra catheter, and a Glidewire, the left gastric vein was accessed with angiogram performed. Coil embolization was then performed of the 2 proximal venous channels with 035 coils to stasis. No reflux after placement of the coils. Catheters and wires were removed. Hemostasis was achieved at the right internal jugular vein with manual pressure. Hemostasis was achieved at the right upper abdomen. Patient remained hemodynamically stable throughout the case. No complications with minimal blood loss. 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 Signed, Dulcy Fanny. Dellia Nims, RPVI [facility] Radiology Electronically Signed   By: Corrie Mckusick D.O.   On: 08/08/2018 07:05   Ir Embo Art  Lawson Fiscal Hemorr Lymph [employer] Guide Roadmapping  Result Date: 08/08/2018 CLINICAL DATA:  67 year old female with cirrhosis and portal hypertension with recurrent bleeding esophageal varices INDICATION: Recurrent bleeding esophageal varices EXAM: ULTRASOUND GUIDED ACCESS RIGHT JUGULAR VEIN FOR TRANSJUGULAR INTRAHEPATIC PORTOSYSTEMIC SHUNT PLACEMENT ULTRASOUND-GUIDED PERCUTANEOUS NEEDLE PLACEMENT, TRANSHEPATIC PORTAL VENOUS ACCESS COIL EMBOLIZATION OF LEFT GASTRIC VEIN AS MAIN CONTRIBUTION TO ESOPHAGEAL  VARICEAL SYSTEM MEDICATIONS: As antibiotic prophylaxis, 2 g Ancef was ordered pre-procedure and administered intravenously within one hour of incision. One unit of platelets was also administered IV during the procedure. ANESTHESIA/SEDATION: General - as administered by the Anesthesia department CONTRAST:  80 cc Omnipaque FLUOROSCOPY TIME:  Fluoroscopy Time: 42 minutes 18 seconds (761 mGy). COMPLICATIONS: None PROCEDURE: Informed written consent was obtained from the patient and the patient's family after a thorough discussion of the procedural risks, benefits and alternatives. Specific risks discussed with TIPS/variceal embolization included: Bleeding, infection, vascular injury, need for further procedure/surgery, renal injury/renal failure, contrast reaction, non-target embolization, liver dysfunction/failure, hepatic encephalopathy, stroke (~1%), cardiopulmonary collapse, death. All questions were addressed. Maximal Sterile Barrier Technique was utilized including caps, mask, sterile gowns, sterile gloves, sterile drape, hand hygiene and skin antiseptic. A timeout was performed prior to the initiation of the procedure. Patient was positioned supine position on the table, with the right upper quadrant and the right neck prepped and draped in the usual sterile fashion. General anesthesia was initiated with anesthesia team. Ultrasound survey was then performed of the right neck, with images stored and sent to PACs. Ultrasound guidance was then used to access the right internal jugular vein with a micro puncture kit. The wire was advanced under fluoroscopy into the right atrium, and a small incision was made. The needle was removed and the dilator was placed. The micro wire in the stiffener were removed and an 035 wire was then passed into the inferior vena cava. Twelve French soft tissue dilation was performed on the wire, and then  10 French TIPS sheath was placed. Combination of Benson wire and multipurpose angled  catheter were used to select the right hepatic vein. During the selection the right hepatic vein, accessory hepatic vein was accessed with venography. Venogram confirmed location within the right hepatic vein. Tip sheath was then advanced over the catheter with a coaxial Amplatz wire, for a position cephalad to the transition point of the wire in the portal venous system. The Colapinto needle was then advanced through the TIPS sheath housed in the Teflon sheath over the Amplatz wire. The needle was then advanced into the patent parenchyma, and CO2 portography was performed with AP and right anterior oblique positions. The CO2 portography was used to estimate location of portal vein. After 3 attempts at passing the goal of into the needle into the portal vein, the needle was withdrawn from the wire with the wire remaining in place. Ultrasound survey of the right upper quadrant was performed, with images stored and sent to PACs. Using ultrasound guidance, Chiba needle was used access the right portal system. Once we confirmed position in the portal system with portal venography, micro wire was advanced into the inferior mesenteric vein. The inner dilator was advanced over the metal stiffener into the portal system, with the inner dilator advanced over the wire. Venogram/portography confirmed are location within the portal system. The micro wire was then replaced into the inner dilator, with the transition point position at the apex of the catheter. We then replaced the TIPS sheath into the right hepatic vein with the use of cobra catheter and a Bentson wire. Bentson wire was exchanged for Amplatz wire and the Teflon housing and the colapinto needle were again advanced into the right hepatic vein. Approximately 3 more attempts were unsuccessful. Needle was removed and accentuated needle curve was placed at the tip of the needle. Approximately 3 more attempts were made to gain access into the portal vein. Britta Mccreedy wire  was then passed through the Colapinto needle into the right portal vein. The Cobra catheter was then advanced through the hepatic tissue into the portal vein with the angle used to direct the Bentson wire into the main portal vein. Once the wire was in the main portal vein, the Cobra catheter was removed over an Amplatz wire. Soft tissue tract dilation was then performed using a 6 mm x 4 cm standard balloon angioplasty. Upon deflation of the final balloon angioplasty the tip sheath was placed over the balloon into the portal vein. Pigtail marking catheter was then advanced over the wire for portal venogram. Estimated length of the tissue tract was then performed with a marking pigtail catheter and we selected a 10 mm diameter by 80 mm length via tore stent graft. A baseline portal pressure was also measured through the pigtail catheter, estimated 25 mm mercury. Pigtail catheter was removed over an Amplatz wire. The selected stent graft was then placed through the tissue tract and sheath, into the portal vein. Partial deployment of the un covered stent was performed with withdrawal to the tissue tract. Sheath was then withdrawn. After deployment of the stent graft, 9 mm balloon angioplasty was performed along the length of the stent graft. Repeat portal pressures performed with a gradient of 14 mm-15 mm mercury. 10 mm balloon angioplasty was then performed along the length of the stent graft. Repeat pressures were performed estimating a final pressure of 13 mm mercury. Pigtail catheter was removed on a Bentson wire. Using a combination of Bentson wire, cobra catheter, and  a Glidewire, the left gastric vein was accessed with angiogram performed. Coil embolization was then performed of the 2 proximal venous channels with 035 coils to stasis. No reflux after placement of the coils. Catheters and wires were removed. Hemostasis was achieved at the right internal jugular vein with manual pressure. Hemostasis was achieved  at the right upper abdomen. Patient remained hemodynamically stable throughout the case. No complications with minimal blood loss. 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 Signed, Dulcy Fanny. Dellia Nims, RPVI [facility] Radiology Electronically Signed   By: Corrie Mckusick D.O.   On: 08/08/2018 07:05    Labs:  CBC: Recent Labs    08/05/18 1525 08/06/18 0601 08/07/18 0442 08/07/18 2030 08/08/18 0337  WBC 4.7 3.0* 3.4*  --  5.2  HGB 8.0* 7.6* 8.5* 8.7* 8.4*  HCT 26.5* 25.2* 27.9* 29.1* 27.9*  PLT 119* PLATELET CLUMPS NOTED ON SMEAR, UNABLE TO ESTIMATE 116*  --  138*    COAGS: Recent Labs    03/15/18 1420 08/02/18 2212 08/03/18 0548 08/05/18 0855  INR 1.09 1.09 1.16 1.14  APTT  --   --  32  --     BMP: Recent Labs    08/04/18 0242 08/05/18 0445 08/06/18 0601 08/07/18 0442  NA 138 142 142 141  K 4.1 3.5 3.5 3.8  CL 110 114* 113* 114*  CO2 18* 21* 21* 17*  GLUCOSE 184* 136* 135* 120*  BUN 25* _0 CALCIUM 8.1* 8.3* 8.5* 8.3*  CREATININE 0.89 0.89 0.78 0.80  GFRNONAA >60 >60 >60 >60  GFRAA >60 >60 >60 >60    LIVER FUNCTION TESTS: Recent Labs    03/15/18 1420 05/24/18 1058 08/02/18 2212 08/05/18 0445  BILITOT 0.8 0.7 0.7 0.9  AST 36 68* 105* 66*  ALT 20 50* 79* 60*  ALKPHOS 241* 252* 216* 133*  PROT 8.5* 7.8 7.1 5.5*  ALBUMIN 3.6 3.5 3.0* 2.5*    Assessment and Plan: Pt with hx NASH cirrhosis, portal HTN, recurrent esoph variceal bleeding; s/p TIPS 9/25; afebrile; WBC nl; hgb 8.4(8.7); plts 139k; no further visible bleeding; ok to advance diet gradually; pt on lactulose;  left IJ cath maintenance per anesthesia; will need f/u in IR clinic in 4-6 weeks with TIPS [place]   Electronically Signed: D. Rowe Robert, PA-C 08/08/2018, 10:28  AM   I spent a total of 15 minutes at the the patient's bedside AND on the patient's hospital floor or unit, greater than 50% of which was counseling/coordinating care for transjugular intrahepatic portosystemic shunt    Patient ID: Crystal [name], female   DOB: 06-23-1951, 67 y.o.   MRN: [id number]

## 2018-08-08 NOTE — Progress Notes (Signed)
[facility] Gastroenterology Progress Note  Crystal [name] 67 y.o. 06/20/1951  CC: Variceal bleeding   Subjective: She underwent TIPS procedure yesterday.  Currently ambulating in the hallway without any issues.  No encephalopathy or further bleeding episodes noted.  Complaining of mild epigastric and right upper quadrant discomfort.  ROS : Negative for chest pain and shortness of breath.   Objective: Vital signs in last 24 hours: Vitals:   08/08/18 0627 08/08/18 1109  BP: (!) 103/57 (!) 94/45  Pulse: 65 (!) 57  Resp: 16 16  Temp: 98.2 F (36.8 C) 98.1 F (36.7 C)  SpO2: 98% 98%    Physical Exam:  General:  Alert, cooperative, no distress, appears stated age  Head:  Normocephalic, without obvious abnormality, atraumatic  Eyes:  , EOM's intact,   Lungs:   Clear to auscultation bilaterally, respirations unlabored  Heart:  Regular rate and rhythm, S1, S2 normal  Abdomen:   Soft, non-tender, bowel sounds present, no peritoneal signs          Lab Results: Recent Labs    08/06/18 0601 08/07/18 0442  NA 142 141  K 3.5 3.8  CL 113* 114*  CO2 21* 17*  GLUCOSE 135* 120*  BUN 13 9  CREATININE 0.78 0.80  CALCIUM 8.5* 8.3*   No results for input(s): AST, ALT, ALKPHOS, BILITOT, PROT, ALBUMIN in the last 72 hours. Recent Labs    08/07/18 0442 08/07/18 2030 08/08/18 0337  WBC 3.4*  --  5.2  NEUTROABS 1.9  --  4.3  HGB 8.5* 8.7* 8.4*  HCT 27.9* 29.1* 27.9*  MCV 89.4  --  90.3  PLT 116*  --  138*   No results for input(s): LABPROT, INR in the last 72 hours.    Assessment/Plan: -Active bleeding from esophageal varices at the time of esophageal banding. S/P Hemospray .  Status post TIPS yesterday. -History of esophageal varices with repeated band ligation resulting in scarring from previous treatment. -Cirrhosis.  Most likely from [place].  Recommendations -------------------------- -Start 2 g sodium diet. -DC octreotide. -Change IV Protonix to p.o. twice a day.   Recommend continuing twice a day PPI at least for another 4 weeks. - follow-up with the primary gastroenterologist in 4 weeks after discharge as well as interventional radiology in 6 weeks. -GI will sign off.  Call us back if needed   Otis Brace MD, [place] 08/08/2018, 11:42 AM  Contact #  [phone]

## 2018-08-09 DIAGNOSIS — E876 Hypokalemia: Secondary | ICD-10-CM

## 2018-08-09 DIAGNOSIS — D500 Iron deficiency anemia secondary to blood loss (chronic): Secondary | ICD-10-CM

## 2018-08-09 LAB — CBC WITH DIFFERENTIAL/PLATELET
Abs Immature Granulocytes: 0 10*3/uL (ref 0.0–0.1)
Basophils Absolute: 0 10*3/uL (ref 0.0–0.1)
Basophils Relative: 0 %
Eosinophils Absolute: 0.1 10*3/uL (ref 0.0–0.7)
Eosinophils Relative: 3 %
HCT: 26.1 % — ABNORMAL LOW (ref 36.0–46.0)
Hemoglobin: 7.7 g/dL — ABNORMAL LOW (ref 12.0–15.0)
Immature Granulocytes: 0 %
Lymphocytes Relative: 16 %
Lymphs Abs: 0.8 10*3/uL (ref 0.7–4.0)
MCH: 26.6 pg (ref 26.0–34.0)
MCHC: 29.5 g/dL — ABNORMAL LOW (ref 30.0–36.0)
MCV: 90 fL (ref 78.0–100.0)
Monocytes Absolute: 0.7 10*3/uL (ref 0.1–1.0)
Monocytes Relative: 14 %
Neutro Abs: 3.3 10*3/uL (ref 1.7–7.7)
Neutrophils Relative %: 67 %
Platelets: 111 10*3/uL — ABNORMAL LOW (ref 150–400)
RBC: 2.9 MIL/uL — ABNORMAL LOW (ref 3.87–5.11)
RDW: 20.9 % — ABNORMAL HIGH (ref 11.5–15.5)
WBC: 5 10*3/uL (ref 4.0–10.5)

## 2018-08-09 LAB — BASIC METABOLIC PANEL
Anion gap: 4 — ABNORMAL LOW (ref 5–15)
BUN: 10 mg/dL (ref 8–23)
CO2: 24 mmol/L (ref 22–32)
Calcium: 8 mg/dL — ABNORMAL LOW (ref 8.9–10.3)
Chloride: 112 mmol/L — ABNORMAL HIGH (ref 98–111)
Creatinine, Ser: 0.77 mg/dL (ref 0.44–1.00)
GFR calc Af Amer: >60 mL/min (ref >60)
GFR calc non Af Amer: >60 mL/min (ref >60)
Glucose, Bld: 146 mg/dL — ABNORMAL HIGH (ref 70–99)
Potassium: 3.3 mmol/L — ABNORMAL LOW (ref 3.5–5.1)
Sodium: 140 mmol/L (ref 135–145)

## 2018-08-09 LAB — PREPARE RBC (CROSSMATCH)

## 2018-08-09 MED ORDER — SODIUM CHLORIDE 0.9% IV SOLUTION
Freq: Once | INTRAVENOUS | Status: AC
  Administered 2018-08-09: 12:00:00 via INTRAVENOUS

## 2018-08-09 MED ORDER — POTASSIUM CHLORIDE CRYS ER 20 MEQ PO TBCR
40.0000 meq | EXTENDED_RELEASE_TABLET | ORAL | Status: AC
  Administered 2018-08-09 (×2): 40 meq via ORAL
  Filled 2018-08-09 (×2): qty 2

## 2018-08-09 NOTE — Progress Notes (Signed)
Referring Physician(s): Dr Cathlean Sauer  Supervising Physician: Marybelle Killings  Patient Status:  [facility] - In-pt  Chief Complaint:  TIPS 9/25  Subjective:  Up on side of bed No complaints 1 U prbc today   Allergies: Patient has no known allergies.  Medications: Prior to Admission medications   Medication Sig Start Date End Date Taking? Authorizing Provider  cyanocobalamin (,VITAMIN B-12,) 1000 MCG/ML injection Inject 1,000 mcg into the muscle once.   Yes [provider]  docusate sodium (COLACE) 100 MG capsule Take 100 mg by mouth daily.   Yes [provider]  furosemide (LASIX) 20 MG tablet Take 2 tablets (40 mg total) by mouth daily. 05/24/18 11/20/18 Yes Vanga, Tally Due, MD  metFORMIN (GLUCOPHAGE) 500 MG tablet Take 1,000 mg by mouth 2 (two) times daily with a meal.    Yes [provider]  nystatin (MYCOSTATIN/NYSTOP) powder Apply 1 g topically as needed (rash).  05/23/18  Yes [provider]  omeprazole (PRILOSEC) 40 MG capsule Take 1 capsule (40 mg total) by mouth 2 (two) times daily before a meal. 05/24/18 11/20/18 Yes Vanga, Tally Due, MD  ondansetron (ZOFRAN) 4 MG tablet Take 1 tablet (4 mg total) by mouth every 6 (six) hours as needed for nausea. 08/03/18  Yes Rosine Door, MD  propranolol (INDERAL) 20 MG tablet Take 1 tablet (20 mg total) by mouth 2 (two) times daily. 02/14/18 08/13/18 Yes Vanga, Tally Due, MD  spironolactone (ALDACTONE) 50 MG tablet Take 2 tablets (100 mg total) by mouth daily. 05/24/18 11/20/18 Yes Vanga, Tally Due, MD     Vital Signs: BP (!) 115/49   Pulse 65   Temp 97.9 F (36.6 C) (Oral)   Resp 16   Ht 5' 2.99" (1.6 m)   Wt 177 lb 0.5 oz (80.3 kg)   SpO2 91%   BMI 31.37 kg/m   Physical Exam  Constitutional: She is oriented to person, place, and time.  Abdominal: Soft. Bowel sounds are normal.  Musculoskeletal: Normal range of motion.  Neurological: She is alert and oriented to person, place, and time.    Skin: Skin is warm and dry.  Right IJ site is clean and dry NT no bleeding No hematoma    Vitals reviewed.   Imaging: Dg Chest 1 View  Result Date: 08/07/2018 CLINICAL DATA:  Bedside central venous catheter placement. EXAM: Portable CHEST 1 VIEW COMPARISON:  08/04/2018, 01/07/2015 and CT chest 05/15/2018. FINDINGS: LEFT jugular central venous catheter tip projects over the expected location of the UPPER SVC with its tip directed laterally. No evidence of pneumothorax or mediastinal hematoma. Suboptimal inspiration accounts for mild bibasilar atelectasis. Cardiac silhouette mildly enlarged, unchanged. Pulmonary venous hypertension and perhaps minimal interstitial pulmonary edema as there are scattered Kerley B lines. IMPRESSION: 1. LEFT jugular central venous catheter tip projects over the UPPER SVC with its tip directed laterally. No acute complicating features. 2. Possible minimal CHF and/or fluid overload. Electronically Signed   By: Evangeline Dakin M.D.   On: 08/07/2018 19:53   Ir Tips  Result Date: 08/08/2018 CLINICAL DATA:  67 year old female with cirrhosis and portal hypertension with recurrent bleeding esophageal varices INDICATION: Recurrent bleeding esophageal varices EXAM: ULTRASOUND GUIDED ACCESS RIGHT JUGULAR VEIN FOR TRANSJUGULAR INTRAHEPATIC PORTOSYSTEMIC SHUNT PLACEMENT ULTRASOUND-GUIDED PERCUTANEOUS NEEDLE PLACEMENT, TRANSHEPATIC PORTAL VENOUS ACCESS COIL EMBOLIZATION OF LEFT GASTRIC VEIN AS MAIN CONTRIBUTION TO ESOPHAGEAL VARICEAL SYSTEM MEDICATIONS: As antibiotic prophylaxis, 2 g Ancef was ordered pre-procedure and administered intravenously within one hour of incision. One unit of  platelets was also administered IV during the procedure. ANESTHESIA/SEDATION: General - as administered by the Anesthesia department CONTRAST:  80 cc Omnipaque FLUOROSCOPY TIME:  Fluoroscopy Time: 42 minutes 18 seconds (761 mGy). COMPLICATIONS: None PROCEDURE: Informed written consent was obtained from  the patient and the patient's family after a thorough discussion of the procedural risks, benefits and alternatives. Specific risks discussed with TIPS/variceal embolization included: Bleeding, infection, vascular injury, need for further procedure/surgery, renal injury/renal failure, contrast reaction, non-target embolization, liver dysfunction/failure, hepatic encephalopathy, stroke (~1%), cardiopulmonary collapse, death. All questions were addressed. Maximal Sterile Barrier Technique was utilized including caps, mask, sterile gowns, sterile gloves, sterile drape, hand hygiene and skin antiseptic. A timeout was performed prior to the initiation of the procedure. Patient was positioned supine position on the table, with the right upper quadrant and the right neck prepped and draped in the usual sterile fashion. General anesthesia was initiated with anesthesia team. Ultrasound survey was then performed of the right neck, with images stored and sent to PACs. Ultrasound guidance was then used to access the right internal jugular vein with a micro puncture kit. The wire was advanced under fluoroscopy into the right atrium, and a small incision was made. The needle was removed and the dilator was placed. The micro wire in the stiffener were removed and an 035 wire was then passed into the inferior vena cava. Twelve French soft tissue dilation was performed on the wire, and then 10 [place] TIPS sheath was placed. Combination of Benson wire and multipurpose angled catheter were used to select the right hepatic vein. During the selection the right hepatic vein, accessory hepatic vein was accessed with venography. Venogram confirmed location within the right hepatic vein. Tip sheath was then advanced over the catheter with a coaxial Amplatz wire, for a position cephalad to the transition point of the wire in the portal venous system. The Colapinto needle was then advanced through the TIPS sheath housed in the Teflon sheath  over the Amplatz wire. The needle was then advanced into the patent parenchyma, and CO2 portography was performed with AP and right anterior oblique positions. The CO2 portography was used to estimate location of portal vein. After 3 attempts at passing the goal of into the needle into the portal vein, the needle was withdrawn from the wire with the wire remaining in place. Ultrasound survey of the right upper quadrant was performed, with images stored and sent to PACs. Using ultrasound guidance, Chiba needle was used access the right portal system. Once we confirmed position in the portal system with portal venography, micro wire was advanced into the inferior mesenteric vein. The inner dilator was advanced over the metal stiffener into the portal system, with the inner dilator advanced over the wire. Venogram/portography confirmed are location within the portal system. The micro wire was then replaced into the inner dilator, with the transition point position at the apex of the catheter. We then replaced the TIPS sheath into the right hepatic vein with the use of cobra catheter and a Bentson wire. Bentson wire was exchanged for Amplatz wire and the Teflon housing and the colapinto needle were again advanced into the right hepatic vein. Approximately 3 more attempts were unsuccessful. Needle was removed and accentuated needle curve was placed at the tip of the needle. Approximately 3 more attempts were made to gain access into the portal vein. Britta Mccreedy wire was then passed through the Colapinto needle into the right portal vein. The Cobra catheter was then advanced through the hepatic  tissue into the portal vein with the angle used to direct the Bentson wire into the main portal vein. Once the wire was in the main portal vein, the Cobra catheter was removed over an Amplatz wire. Soft tissue tract dilation was then performed using a 6 mm x 4 cm standard balloon angioplasty. Upon deflation of the final balloon  angioplasty the tip sheath was placed over the balloon into the portal vein. Pigtail marking catheter was then advanced over the wire for portal venogram. Estimated length of the tissue tract was then performed with a marking pigtail catheter and we selected a 10 mm diameter by 80 mm length via tore stent graft. A baseline portal pressure was also measured through the pigtail catheter, estimated 25 mm mercury. Pigtail catheter was removed over an Amplatz wire. The selected stent graft was then placed through the tissue tract and sheath, into the portal vein. Partial deployment of the un covered stent was performed with withdrawal to the tissue tract. Sheath was then withdrawn. After deployment of the stent graft, 9 mm balloon angioplasty was performed along the length of the stent graft. Repeat portal pressures performed with a gradient of 14 mm-15 mm mercury. 10 mm balloon angioplasty was then performed along the length of the stent graft. Repeat pressures were performed estimating a final pressure of 13 mm mercury. Pigtail catheter was removed on a Bentson wire. Using a combination of Bentson wire, cobra catheter, and a Glidewire, the left gastric vein was accessed with angiogram performed. Coil embolization was then performed of the 2 proximal venous channels with 035 coils to stasis. No reflux after placement of the coils. Catheters and wires were removed. Hemostasis was achieved at the right internal jugular vein with manual pressure. Hemostasis was achieved at the right upper abdomen. Patient remained hemodynamically stable throughout the case. No complications with minimal blood loss. 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 Signed, Dulcy Fanny. Dellia Nims, RPVI [facility] Inter[facility] Rad[facility]  Electronically Signed   By: Corrie Mckusick D.O.   On: 08/08/2018 07:05   Ir Embo Art  Lawson Fiscal Hemorr Lymph [employer] Guide Roadmapping  Result Date: 08/08/2018 CLINICAL DATA:  67 year old female with cirrhosis and portal hypertension with recurrent bleeding esophageal varices INDICATION: Recurrent bleeding esophageal varices EXAM: ULTRASOUND GUIDED ACCESS RIGHT JUGULAR VEIN FOR TRANSJUGULAR INTRAHEPATIC PORTOSYSTEMIC SHUNT PLACEMENT ULTRASOUND-GUIDED PERCUTANEOUS NEEDLE PLACEMENT, TRANSHEPATIC PORTAL VENOUS ACCESS COIL EMBOLIZATION OF LEFT GASTRIC VEIN AS MAIN CONTRIBUTION TO ESOPHAGEAL VARICEAL SYSTEM MEDICATIONS: As antibiotic prophylaxis, 2 g Ancef was ordered pre-procedure and administered intravenously within one hour of incision. One unit of platelets was also administered IV during the procedure. ANESTHESIA/SEDATION: General - as administered by the Anesthesia department CONTRAST:  80 cc Omnipaque FLUOROSCOPY TIME:  Fluoroscopy Time: 42 minutes 18 seconds (761 mGy). COMPLICATIONS: None PROCEDURE: Informed written consent was obtained from the patient and the patient's family after a thorough discussion of the procedural risks, benefits and alternatives. Specific risks discussed with TIPS/variceal embolization included: Bleeding, infection, vascular injury, need for further procedure/surgery, renal injury/renal failure, contrast reaction, non-target embolization, liver dysfunction/failure, hepatic encephalopathy, stroke (~1%), cardiopulmonary collapse, death. All questions were addressed. Maximal Sterile Barrier Technique was utilized including caps, mask, sterile gowns, sterile gloves, sterile drape, hand hygiene and skin antiseptic. A timeout was performed prior to the initiation of the procedure. Patient was positioned  supine position on the table, with the right upper quadrant and the right neck prepped and draped in the usual sterile fashion. General anesthesia was initiated with anesthesia team.  Ultrasound survey was then performed of the right neck, with images stored and sent to PACs. Ultrasound guidance was then used to access the right internal jugular vein with a micro puncture kit. The wire was advanced under fluoroscopy into the right atrium, and a small incision was made. The needle was removed and the dilator was placed. The micro wire in the stiffener were removed and an 035 wire was then passed into the inferior vena cava. Twelve French soft tissue dilation was performed on the wire, and then 10 [place] TIPS sheath was placed. Combination of Benson wire and multipurpose angled catheter were used to select the right hepatic vein. During the selection the right hepatic vein, accessory hepatic vein was accessed with venography. Venogram confirmed location within the right hepatic vein. Tip sheath was then advanced over the catheter with a coaxial Amplatz wire, for a position cephalad to the transition point of the wire in the portal venous system. The Colapinto needle was then advanced through the TIPS sheath housed in the Teflon sheath over the Amplatz wire. The needle was then advanced into the patent parenchyma, and CO2 portography was performed with AP and right anterior oblique positions. The CO2 portography was used to estimate location of portal vein. After 3 attempts at passing the goal of into the needle into the portal vein, the needle was withdrawn from the wire with the wire remaining in place. Ultrasound survey of the right upper quadrant was performed, with images stored and sent to PACs. Using ultrasound guidance, Chiba needle was used access the right portal system. Once we confirmed position in the portal system with portal venography, micro wire was advanced into the inferior mesenteric vein. The inner dilator was advanced over the metal stiffener into the portal system, with the inner dilator advanced over the wire. Venogram/portography confirmed are location within the portal  system. The micro wire was then replaced into the inner dilator, with the transition point position at the apex of the catheter. We then replaced the TIPS sheath into the right hepatic vein with the use of cobra catheter and a Bentson wire. Bentson wire was exchanged for Amplatz wire and the Teflon housing and the colapinto needle were again advanced into the right hepatic vein. Approximately 3 more attempts were unsuccessful. Needle was removed and accentuated needle curve was placed at the tip of the needle. Approximately 3 more attempts were made to gain access into the portal vein. Britta Mccreedy wire was then passed through the Colapinto needle into the right portal vein. The Cobra catheter was then advanced through the hepatic tissue into the portal vein with the angle used to direct the Bentson wire into the main portal vein. Once the wire was in the main portal vein, the Cobra catheter was removed over an Amplatz wire. Soft tissue tract dilation was then performed using a 6 mm x 4 cm standard balloon angioplasty. Upon deflation of the final balloon angioplasty the tip sheath was placed over the balloon into the portal vein. Pigtail marking catheter was then advanced over the wire for portal venogram. Estimated length of the tissue tract was then performed with a marking pigtail catheter and we selected a 10 mm diameter by 80 mm length via tore stent graft. A baseline portal pressure was also measured through the pigtail catheter, estimated 25 mm  mercury. Pigtail catheter was removed over an Amplatz wire. The selected stent graft was then placed through the tissue tract and sheath, into the portal vein. Partial deployment of the un covered stent was performed with withdrawal to the tissue tract. Sheath was then withdrawn. After deployment of the stent graft, 9 mm balloon angioplasty was performed along the length of the stent graft. Repeat portal pressures performed with a gradient of 14 mm-15 mm mercury. 10 mm  balloon angioplasty was then performed along the length of the stent graft. Repeat pressures were performed estimating a final pressure of 13 mm mercury. Pigtail catheter was removed on a Bentson wire. Using a combination of Bentson wire, cobra catheter, and a Glidewire, the left gastric vein was accessed with angiogram performed. Coil embolization was then performed of the 2 proximal venous channels with 035 coils to stasis. No reflux after placement of the coils. Catheters and wires were removed. Hemostasis was achieved at the right internal jugular vein with manual pressure. Hemostasis was achieved at the right upper abdomen. Patient remained hemodynamically stable throughout the case. No complications with minimal blood loss. 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 Signed, Dulcy Fanny. Dellia Nims, RPVI [facility]ascular [facility] Interventional Radiology Specialists [facility] Radiology Electronically Signed   By: Corrie Mckusick D.O.   On: 08/08/2018 07:05    Labs:  CBC: Recent Labs    08/06/18 0601 08/07/18 0442 08/07/18 2030 08/08/18 0337 08/09/18 0500  WBC 3.0* 3.4*  --  5.2 5.0  HGB 7.6* 8.5* 8.7* 8.4* 7.7*  HCT 25.2* 27.9* 29.1* 27.9* 26.1*  PLT PLATELET CLUMPS NOTED ON SMEAR, UNABLE TO ESTIMATE 116*  --  138* 111*    COAGS: Recent Labs    03/15/18 1420 08/02/18 2212 08/03/18 0548 08/05/18 0855  INR 1.09 1.09 1.16 1.14  APTT  --   --  32  --     BMP: Recent Labs    08/05/18 0445 08/06/18 0601 08/07/18 0442 08/09/18 0410  NA 142 142 141 140  K 3.5 3.5 3.8 3.3*  CL 114* 113* 114* 112*  CO2 21* 21* 17* 24  GLUCOSE 136* 135* 120* 146*  BUN _0 CALCIUM 8.3* 8.5* 8.3* 8.0*  CREATININE 0.89 0.78 0.80 0.77  GFRNONAA >60 >60 >60 >60  GFRAA >60 >60 >60 >60    LIVER FUNCTION TESTS: Recent Labs     03/15/18 1420 05/24/18 1058 08/02/18 2212 08/05/18 0445  BILITOT 0.8 0.7 0.7 0.9  AST 36 68* 105* 66*  ALT 20 50* 79* 60*  ALKPHOS 241* 252* 216* 133*  PROT 8.5* 7.8 7.1 5.5*  ALBUMIN 3.6 3.5 3.0* 2.5*    Assessment and Plan:  TIPS 9/25 Tx 1 u prbc today Feels fine Plan for dc in am per pt Follow up 4 weeks with Dr [name] after TIPS Order in place for follow up  Electronically Signed: Gabino Hagin A, PA-C 08/09/2018, 2:36 PM   I spent a total of 15 Minutes at the the patient's bedside AND on the patient's hospital floor or unit, greater than 50% of which was counseling/coordinating care for TIPS

## 2018-08-09 NOTE — Plan of Care (Signed)

## 2018-08-09 NOTE — Progress Notes (Signed)
Physical Therapy Treatment Patient Details Name: Crystal [name] MRN: [id number] DOB: [birth year] Today's Date: 08/09/2018    History of Present Illness Pt is a 67 y/o female admitted secondary to upper GI bleed. Pt is s/p TIPD procedure on 9/26. PMH includes hepatitis C, DM, and GI bleed.     PT Comments    Pt making steady progress with functional mobility. Pt would continue to benefit from skilled physical therapy services at this time while admitted and after d/c to address the below listed limitations in order to improve overall safety and independence with functional mobility.    Follow Up Recommendations  Home health PT     Equipment Recommendations  Rolling walker with 5" wheels    Recommendations for Other Services       Precautions / Restrictions Precautions Precautions: None Restrictions Weight Bearing Restrictions: No    Mobility  Bed Mobility               General bed mobility comments: pt OOB in recliner chair upon arrival  Transfers Overall transfer level: Needs assistance Equipment used: Rolling walker (2 wheeled) Transfers: Sit to/from Stand Sit to Stand: Supervision         General transfer comment: supervision for safety  Ambulation/Gait Ambulation/Gait assistance: Min guard;Supervision Gait Distance (Feet): 500 Feet Assistive device: Rolling walker (2 wheeled) Gait Pattern/deviations: Step-through pattern;Decreased stride length Gait velocity: Decreased  Gait velocity interpretation: 1.31 - 2.62 ft/sec, indicative of limited community ambulator General Gait Details: pt with slow, cautious gait, however, overall steady using RW   Stairs             Wheelchair Mobility    Modified Rankin (Stroke Patients Only)       Balance Overall balance assessment: Needs assistance Sitting-balance support: No upper extremity supported;Feet supported Sitting balance-Leahy Scale: Good     Standing balance support: Bilateral upper  extremity supported;During functional activity Standing balance-Leahy Scale: Poor                              Cognition Arousal/Alertness: Awake/alert Behavior During Therapy: WFL for tasks assessed/performed Overall Cognitive Status: Within Functional Limits for tasks assessed                                        Exercises      General Comments        Pertinent Vitals/Pain Pain Assessment: No/denies pain    Home Living                      Prior Function            PT Goals (current goals can now be found in the care plan section) Acute Rehab PT Goals PT Goal Formulation: With patient Time For Goal Achievement: 08/22/18 Potential to Achieve Goals: Good Progress towards PT goals: Progressing toward goals    Frequency    Min 3X/week      PT Plan Current plan remains appropriate    Co-evaluation              AM-PAC PT "6 Clicks" Daily Activity  Outcome Measure  Difficulty turning over in bed (including adjusting bedclothes, sheets and blankets)?: A Little Difficulty moving from lying on back to sitting on the side of the bed? : A Little Difficulty sitting down on and  standing up from a chair with arms (e.g., wheelchair, bedside commode, etc,.)?: Unable Help needed moving to and from a bed to chair (including a wheelchair)?: A Little Help needed walking in hospital room?: A Little Help needed climbing 3-5 steps with a railing? : A Little 6 Click Score: 16    End of Session Equipment Utilized During Treatment: Gait belt Activity Tolerance: Patient tolerated treatment well Patient left: in chair;with call bell/phone within reach;with family/visitor present Nurse Communication: Mobility status PT Visit Diagnosis: Other abnormalities of gait and mobility (R26.89);Muscle weakness (generalized) (M62.81)     Time: 7035-0093 PT Time Calculation (min) (ACUTE ONLY): 16 min  Charges:  $Gait Training: 8-22 mins                      Sherie Don, [place], DPT  Acute Rehabilitation Services Pager [phone] Office [place] 08/09/2018, 11:16 AM

## 2018-08-09 NOTE — Progress Notes (Signed)
PROGRESS NOTE    Crystal [name]  QIH:[id number] DOB: Feb 11, 1951 DOA: 08/03/2018 PCP: Lorelee Market, MD    Brief Narrative:  67 year old female who presented with rectal bleeding. 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 She complain of abdominal pain, associated with hematochezia. On her initial physical examination blood pressure 123/73,her lungs are clear to auscultation bilaterally, heart S1-S2 present rhythmic, the abdomen was soft nontender, no lower extremity edema.  Sodium 137, potassium 4.4, chloride 106, bicarb 22, glucose 145, BUN 22, creatinine 0.69, white cell count 7.0, hemoglobin 9.0, hematocrit 27.0, platelets 198.  Chest radiograph of the right lower lobe pulmonary nodule.  CT of the abdomen with small varices in the distal esophagus and GE junction, associated with left gastric vein.  Portal venous system is patent.  Cirrhotic changes with portal hypertension.  Positive for esophageal varices, ascites and splenomegaly.  No significant change in right lower lobe pulmonary nodule.  EKG sinus rhythm, normal axis, normal intervals, low voltage, poor R wave progression.  Patient was admitted to the hospitalwith theworking diagnosis of acute blood loss anemia due to upper GI bleed in the setting of esophageal varicesdue tocirrhosis   Assessment & Plan:   Active Problems:   Upper GI bleeding   Acute upper gastrointestinal bleeding   1. Acute upper GI bleed, variceal bleeding with acute blood loss anemia. SP upper endoscopy, unable to band varices due to previous scarring.  sp TIPS. Patient with episodes of hypotension (systolic blood pressure 94 mmHg), required IV bolus of isotonic saline, this am hb down to 7,7 with Hct 26.1. Patient reports red stools. Will transfuse one unit prbc and will continue to monitor hb and hct.   2. Liver cirrhosis due to NASH. Tolerating well lactulose, will hold on non selective b  blocker (propranolol) due to bradycardia and hypotension. No clinical signs of encephalopathy. Will continue physical therapy evaluation and out of bed to chair.   3. T2DM. Fasting glucose 146. Patient tolerating po well. Holding insulin therapy.   4. Post-operative respiratory failure.It has resolved, oxymetry today 97% on room air.   5. Hypokalemia. Will correct K with po kcl, 80 meq, in 2 divided doses, follow renal panel in am. Renal function remains preserved with serum cr at 0.77.   DVT prophylaxis:scd Code Status:full Family Communication:no family at the bedside   Disposition Plan/ discharge barriers:possible discharge on 09/28 if blood pressure and cell count remain stable.    Consultants:  GI  IR  Procedures:  EGD   TIPS   Subjective: Patient with positive fatigue, no nausea or vomiting, positive bowel movements. Had hypotension yesterday that required IV bolus of fluids. Continue to have red stools.   Objective: Vitals:   08/08/18 1722 08/08/18 1800 08/08/18 2044 08/09/18 0528  BP: (!) 99/49 (!) 100/47 (!) 94/50 (!) 117/50  Pulse: (!) 57 60 (!) 57 61  Resp: _0 Temp: 97.8 F (36.6 C) 98.1 F (36.7 C) 98.4 F (36.9 C) 98.6 F (37 C)  TempSrc: Oral Oral Oral Oral  SpO2: 93%  94% 97%  Weight:      Height:        Intake/Output Summary (Last 24 hours) at 08/09/2018 0933 Last data filed at 08/09/2018 0531 Gross per 24 hour  Intake 505.64 ml  Output 200 ml  Net 305.64 ml   Filed Weights   08/04/18 0446 08/05/18 0515 08/07/18 1233  Weight: 82.2 kg 80.3 kg 80.3 kg  Examination:   General: deconditioned  Neurology: Awake and alert, non focal  E ENT: positive pallor, no icterus, oral mucosa moist Cardiovascular: No JVD. S1-S2 present, rhythmic, no gallops, rubs, or murmurs. No lower extremity edema. Pulmonary: positive breath sounds bilaterally, adequate air movement, no wheezing, rhonchi or rales. Gastrointestinal. Abdomen  with no organomegaly, non tender, no rebound or guarding Skin. No rashes Musculoskeletal: no joint deformities     Data Reviewed: I have personally reviewed following labs and imaging studies  CBC: Recent Labs  Lab 08/05/18 0445 08/05/18 1525 08/06/18 0601 08/07/18 0442 08/07/18 2030 08/08/18 0337 08/09/18 0500  WBC 3.2* 4.7 3.0* 3.4*  --  5.2 5.0  NEUTROABS 1.9  --  2.0 1.9  --  4.3 3.3  HGB 7.3* 8.0* 7.6* 8.5* 8.7* 8.4* 7.7*  HCT 23.6* 26.5* 25.2* 27.9* 29.1* 27.9* 26.1*  MCV 87.1 88.9 90.6 89.4  --  90.3 90.0  PLT 120* 119* PLATELET CLUMPS NOTED ON SMEAR, UNABLE TO ESTIMATE 116*  --  138* 621*   Basic Metabolic Panel: Recent Labs  Lab 08/03/18 0548 08/04/18 0242 08/05/18 0445 08/05/18 0855 08/06/18 0601 08/07/18 0442 08/09/18 0410  NA  --  138 142  --  142 141 140  K  --  4.1 3.5  --  3.5 3.8 3.3*  CL  --  110 114*  --  113* 114* 112*  CO2  --  18* 21*  --  21* 17* 24  GLUCOSE  --  184* 136*  --  135* 120* 146*  BUN  --  25* 23  --  _0 CREATININE  --  0.89 0.89  --  0.78 0.80 0.77  CALCIUM  --  8.1* 8.3*  --  8.5* 8.3* 8.0*  MG 1.6*  --   --  1.9  --   --   --   PHOS 2.8  --   --   --   --   --   --    GFR: Estimated Creatinine Clearance: 69.5 mL/min (by C-G formula based on SCr of 0.77 mg/dL). Liver Function Tests: Recent Labs  Lab 08/02/18 2212 08/05/18 0445  AST 105* 66*  ALT 79* 60*  ALKPHOS 216* 133*  BILITOT 0.7 0.9  PROT 7.1 5.5*  ALBUMIN 3.0* 2.5*   No results for input(s): LIPASE, AMYLASE in the last 168 hours. No results for input(s): AMMONIA in the last 168 hours. Coagulation Profile: Recent Labs  Lab 08/02/18 2212 08/03/18 0548 08/05/18 0855  INR 1.09 1.16 1.14   Cardiac Enzymes: No results for input(s): CKTOTAL, CKMB, CKMBINDEX, TROPONINI in the last 168 hours. BNP (last 3 results) No results for input(s): PROBNP in the last 8760 hours. HbA1C: No results for input(s): HGBA1C in the last 72 hours. CBG: Recent Labs    Lab 08/03/18 0437 08/03/18 1158 08/03/18 1528 08/05/18 2149 08/07/18 1749  GLUCAP 95 163* 159* 135* 102*   Lipid Profile: No results for input(s): CHOL, HDL, LDLCALC, TRIG, CHOLHDL, LDLDIRECT in the last 72 hours. Thyroid Function Tests: No results for input(s): TSH, T4TOTAL, FREET4, T3FREE, THYROIDAB in the last 72 hours. Anemia Panel: No results for input(s): VITAMINB12, FOLATE, FERRITIN, TIBC, IRON, RETICCTPCT in the last 72 hours.    Radiology Studies: I have reviewed all of the imaging during this hospital visit personally     Scheduled Meds: . sodium chloride   Intravenous Once  . sodium chloride   Intravenous Once  . lactulose  20 g Oral TID  .  pantoprazole  40 mg Oral BID   Continuous Infusions: . sodium chloride    . lactated ringers 10 mL/hr at 08/07/18 1239     LOS: 6 days        Tawni Millers, MD [facility] Pager [phone]

## 2018-08-09 NOTE — Care Management Note (Addendum)
Case Management Note  Patient Details  Name: Crystal [name] MRN: [id number] Date of Birth: [birth year]  Subjective/Objective:                  Confirmed face sheet information with patient   Action/Plan:  Called Reggie with [facility] for walker Expected Discharge Date:                  Expected Discharge Plan:  Home/Self Care  In-House Referral:     Discharge planning Services  CM Consult  Post Acute Care Choice:  Home Health Choice offered to:   patient   DME Arranged:  Walker rolling DME Agency:  [place]:  PT [facility] Agency:  [place]  Status of Service: completed   If discussed at [place] of Stay Meetings, dates discussed:    Additional Comments:  Marilu Favre, RN 08/09/2018, 10:14 AM

## 2018-08-10 LAB — BASIC METABOLIC PANEL
Anion gap: 6 (ref 5–15)
BUN: 6 mg/dL — ABNORMAL LOW (ref 8–23)
CO2: 23 mmol/L (ref 22–32)
Calcium: 8.3 mg/dL — ABNORMAL LOW (ref 8.9–10.3)
Chloride: 111 mmol/L (ref 98–111)
Creatinine, Ser: 0.65 mg/dL (ref 0.44–1.00)
GFR calc Af Amer: >60 mL/min (ref >60)
GFR calc non Af Amer: >60 mL/min (ref >60)
Glucose, Bld: 114 mg/dL — ABNORMAL HIGH (ref 70–99)
Potassium: 3.8 mmol/L (ref 3.5–5.1)
Sodium: 140 mmol/L (ref 135–145)

## 2018-08-10 LAB — CBC WITH DIFFERENTIAL/PLATELET
Abs Immature Granulocytes: 0 10*3/uL (ref 0.0–0.1)
Basophils Absolute: 0 10*3/uL (ref 0.0–0.1)
Basophils Relative: 0 %
Eosinophils Absolute: 0.2 10*3/uL (ref 0.0–0.7)
Eosinophils Relative: 4 %
HCT: 28.7 % — ABNORMAL LOW (ref 36.0–46.0)
Hemoglobin: 8.8 g/dL — ABNORMAL LOW (ref 12.0–15.0)
Immature Granulocytes: 1 %
Lymphocytes Relative: 24 %
Lymphs Abs: 1.2 10*3/uL (ref 0.7–4.0)
MCH: 27.6 pg (ref 26.0–34.0)
MCHC: 30.7 g/dL (ref 30.0–36.0)
MCV: 90 fL (ref 78.0–100.0)
Monocytes Absolute: 0.6 10*3/uL (ref 0.1–1.0)
Monocytes Relative: 13 %
Neutro Abs: 3 10*3/uL (ref 1.7–7.7)
Neutrophils Relative %: 58 %
Platelets: 116 10*3/uL — ABNORMAL LOW (ref 150–400)
RBC: 3.19 MIL/uL — ABNORMAL LOW (ref 3.87–5.11)
RDW: 20.7 % — ABNORMAL HIGH (ref 11.5–15.5)
WBC: 5.1 10*3/uL (ref 4.0–10.5)

## 2018-08-10 MED ORDER — LACTULOSE 10 GM/15ML PO SOLN: 20.0000 g | Freq: Two times a day (BID) | ORAL | 0 refills | Status: AC

## 2018-08-10 MED ORDER — PANTOPRAZOLE SODIUM 40 MG PO TBEC: 40.0000 mg | DELAYED_RELEASE_TABLET | Freq: Two times a day (BID) | ORAL | 0 refills | Status: DC

## 2018-08-10 NOTE — Progress Notes (Signed)
Discharge Home. Home discharge instruction given, no question verbalized.

## 2018-08-10 NOTE — Discharge Summary (Signed)
Physician Discharge Summary  Crystal [name] BOF:[id number] DOB: June 06, 1951 DOA: 08/03/2018  PCP: Lorelee Market, MD  Admit date: 08/03/2018 Discharge date: 08/10/2018  Admitted From: Home  Disposition:  Home   Recommendations for Outpatient Follow-up and new medication changes:  1. Follow up with Dr. Bernita Buffy in 7 days 2. Patient has been placed on lactulose 3. Continue propranolol, furosemide and aldactone 4. Follow on renal function and electrolytes as outpatient.  5. Follow up on right lower lobe pulmonary nodule (1.7 cm).  6. Follow up with interventional radiology in 4 weeks.   Home Health: yes   Equipment/Devices: no   Discharge Condition: stable  CODE STATUS: full Diet recommendation: heart healthy   Brief/Interim Summary: 67 year old female who presented with rectal bleeding. 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 She complain of abdominal pain, associated with hematochezia. On her initial physical examination blood pressure 123/73,her lungs were clear to auscultation bilaterally, heart S1-S2 present rhythmic, the abdomen was soft nontender, no lower extremity edema.Sodium 137, potassium 4.4, chloride 106, bicarb 22, glucose 145, BUN 22, creatinine 0.69, white cell count 7.0, hemoglobin 9.0, hematocrit 27.0, platelets 198.Chest radiograph of the right lower lobe pulmonary nodule. CT of the abdomen with small varices in the distal esophagus and GE junction, associated with left gastric vein. Portal venous system is patent. Cirrhotic changes with portal hypertension. Positive for esophageal varices, ascites and splenomegaly. No significant change in right lower lobe pulmonary nodule. EKG sinus rhythm, normal axis, normal intervals, low voltage, poor R wave progression.  Patient was admitted to the hospitalwith theworking diagnosis of acute blood loss anemia due to upper GI bleed in the setting of esophageal  varicesdue tocirrhosis  1.  Acute upper GI bleed, variceal bleeding, with acute blood loss anemia.  Patient was admitted to the medical ward, she was placed on a remote telemetry monitor, she received IV fluids, IV octreotide, IV proton pump inhibitor, IV ceftriaxone and frequent monitoring of hemoglobin and hematocrit.  She underwent upper endoscopy finding portal hypertensive gastropathy, bleeding large more than 5 mm esophageal varices.  It was unsuccessful to control the bleeding endoscopically and it was recommended to proceed with urgent TIPS.  Patient was intubated during upper endoscopy for airway protection and was transferred [facility] to [facility] intensive care unit.  Patient underwent TIPS on September 25.  Her nadir hemoglobin was 7.7, she received 1 unit of packed red blood cells, discharge hemoglobin is 8.8/ Hct 28.7.  Patient will be discharged on pantoprazole 40 mg twice daily.  2.  Impending respiratory failure due to upper GI bleed (not hypoxic, not hypercapnic acute respiratory failure).  Patient remain on invasive mechanical ventilation after upper endoscopy.  After hemodynamic stabilization she was successfully liberated from mechanical ventilation.  Chest x-ray did not reveal aspiration.  Patient had a left internal jugular vein central venous catheter placed.   3.  Liver cirrhosis due to [place].  Patient had decompensated cirrhosis, meld score 9.  Hepatitis C antibodies have been positive, but viral load nondetectable.  Patient will continue taking propanolol and diuretics with furosemide and Aldactone.  Lactulose has been added to prevent portal systemic encephalopathy.  4.  Type 2 diabetes mellitus.  Capillary glucose remained well controlled.  5.  Hypokalemia.  Potassium was corrected with potassium chloride.  Discharge potassium 3.8, kidney function remained stable with a discharge creatinine 0.65.    Discharge Diagnoses:  Active Problems:    Upper GI  bleeding   Acute upper gastrointestinal bleeding    Discharge Instructions   Allergies as of 08/10/2018   No Known Allergies     Medication List    STOP taking these medications   omeprazole 40 MG capsule Commonly known as:  PRILOSEC     TAKE these medications   cyanocobalamin 1000 MCG/ML injection Commonly known as:  (VITAMIN B-12) Inject 1,000 mcg into the muscle once.   docusate sodium 100 MG capsule Commonly known as:  COLACE Take 100 mg by mouth daily.   furosemide 20 MG tablet Commonly known as:  LASIX Take 2 tablets (40 mg total) by mouth daily.   lactulose 10 GM/15ML solution Commonly known as:  CHRONULAC Take 30 mLs (20 g total) by mouth 2 (two) times daily.   metFORMIN 500 MG tablet Commonly known as:  GLUCOPHAGE Take 1,000 mg by mouth 2 (two) times daily with a meal.   nystatin powder Commonly known as:  MYCOSTATIN/NYSTOP Apply 1 g topically as needed (rash).   ondansetron 4 MG tablet Commonly known as:  ZOFRAN Take 1 tablet (4 mg total) by mouth every 6 (six) hours as needed for nausea.   pantoprazole 40 MG tablet Commonly known as:  PROTONIX Take 1 tablet (40 mg total) by mouth 2 (two) times daily.   propranolol 20 MG tablet Commonly known as:  INDERAL Take 1 tablet (20 mg total) by mouth 2 (two) times daily.   spironolactone 50 MG tablet Commonly known as:  ALDACTONE Take 2 tablets (100 mg total) by mouth daily.            Durable Medical Equipment  (From admission, onward)         Start     Ordered   08/09/18 1017  For home use only DME Walker rolling  Once    Comments:  Wt 80.3 kg, ht 5'3"  Question:  Patient needs a walker to treat with the following condition  Answer:  Acute upper GI bleed   08/09/18 1016         Follow-up [facility] up.   Contact information: [place] [address] [phone]        Corrie Mckusick, DO Follow up in 5 week(s).    Specialties:  Interventional Radiology, Radiology Why:  follow up with Dr [name] 4-6 weeks-- pt will hear from scheduler fot time and date-- call [phone] if any questions Contact information: [address] [place] [address] [phone]          No Known Allergies  Consultations:  Interventional radiology    Procedures/Studies: Dg Chest 1 View  Result Date: 08/07/2018 CLINICAL DATA:  Bedside central venous catheter placement. EXAM: Portable CHEST 1 VIEW COMPARISON:  08/04/2018, 01/07/2015 and CT chest 05/15/2018. FINDINGS: LEFT jugular central venous catheter tip projects over the expected location of the UPPER SVC with its tip directed laterally. No evidence of pneumothorax or mediastinal hematoma. Suboptimal inspiration accounts for mild bibasilar atelectasis. Cardiac silhouette mildly enlarged, unchanged. Pulmonary venous hypertension and perhaps minimal interstitial pulmonary edema as there are scattered Kerley B lines. IMPRESSION: 1. LEFT jugular central venous catheter tip projects over the UPPER SVC with its tip directed laterally. No acute complicating features. 2. Possible minimal CHF and/or fluid overload. Electronically Signed   By: Evangeline Dakin M.D.   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 Patient is being evaluated for TIPS procedure. EXAM: CT ABDOMEN AND PELVIS WITH CONTRAST TECHNIQUE: Multidetector CT imaging of the abdomen and pelvis was performed using the standard protocol following bolus administration of intravenous contrast. CONTRAST:  150m OMNIPAQUE IOHEXOL 300 MG/ML  SOLN COMPARISON:  Chest CT 05/15/2018 and abdominal CT 02/09/2018 FINDINGS: Lower chest: Again noted is a lesion in the right lower lobe measuring roughly 1.7 cm and minimally changed from the most recent CT. No large pleural effusions. Hepatobiliary: Perihepatic  ascites. Liver appears to be mildly nodular and similar to the prior examination. Mild distention of the gallbladder with a calcified stone. Punctate low-density structure at the hepatic dome is unchanged and suggestive for a small cyst. No suspicious hepatic lesions. Main portal vein, right portal vein and left portal vein are patent. No biliary dilatation. Pancreas: Unremarkable. No pancreatic ductal dilatation or surrounding inflammatory changes. Spleen: Stable enlargement of the spleen. Small amount of ascites in left upper abdomen. Adrenals/Urinary Tract: Normal adrenal glands. Normal appearance of both kidneys without hydronephrosis. No suspicious renal lesions. Gas in the urinary bladder may be iatrogenic. No evidence for bladder wall thickening. Stomach/Bowel: No gross abnormality to the stomach, small bowel or colon. No acute bowel inflammatory changes. Vascular/Lymphatic: Atherosclerotic calcifications at the right renal artery origin. At least mild narrowing at the origin of the right renal artery. Main visceral arteries are patent. Negative for an aortic aneurysm. Iliac veins are patent. IVC is patent. Small varices involving the distal esophagus and GE junction associated with the left gastric or coronary vein. Few small gastric varices which are probably involving the short gastric veins. Enlarged lymph nodes throughout the gastrohepatic ligament and upper abdomen are similar to the prior abdominal CT and likely secondary to cirrhosis. Index lymph node near the porta hepatis measures 2.1 cm on sequence 6 image 22 and stable. Again noted is lymph node enlargement in the central abdominal mesentery. Reproductive: Evidence for tubal ligation. Again noted is a large low-density structure involving the left ovary/adnexa that measures roughly 7 cm and similar to the CT from 02/09/2018. this was a simple cyst on ultrasound from 03/18/2018. Other: Small amount of upper abdominal ascites. No significant pelvic  ascites. Diffuse subcutaneous edema. Negative for free air. Musculoskeletal: Anterolisthesis at L4-L5 is likely secondary to facet arthropathy. IMPRESSION: 1. Small varices in the distal esophagus and GE junction associated with the left gastric vein. Portal venous system is patent. Cirrhotic changes with portal hypertension demonstrated by esophageal varices, ascites and splenomegaly. 2. Stable abdominal lymphadenopathy which is likely secondary to cirrhosis. 3. No significant change in the right lower lobe pulmonary nodule. 4. Gas within the urinary bladder is nonspecific and could be iatrogenic. 5. No change in the large left adnexal/ovarian cyst. 6. Cholelithiasis. Electronically Signed   By: AMarkus DaftM.D.   On: 08/05/2018 17:09   Ir Tips  Result Date: 08/08/2018 CLINICAL DATA:  67year old female with cirrhosis and portal hypertension with recurrent bleeding esophageal varices INDICATION: Recurrent bleeding esophageal varices EXAM: ULTRASOUND GUIDED ACCESS RIGHT JUGULAR VEIN FOR TRANSJUGULAR INTRAHEPATIC PORTOSYSTEMIC SHUNT PLACEMENT ULTRASOUND-GUIDED PERCUTANEOUS NEEDLE PLACEMENT, TRANSHEPATIC PORTAL VENOUS ACCESS COIL EMBOLIZATION OF LEFT GASTRIC VEIN AS MAIN CONTRIBUTION TO ESOPHAGEAL VARICEAL SYSTEM MEDICATIONS: As antibiotic prophylaxis, 2 g Ancef was ordered pre-procedure and administered intravenously within one hour of incision. One unit of platelets was also administered IV during the procedure. ANESTHESIA/SEDATION: General - as administered by the Anesthesia department CONTRAST:  80 cc Omnipaque FLUOROSCOPY TIME:  Fluoroscopy Time: 42 minutes 18 seconds (761 mGy). COMPLICATIONS: None PROCEDURE: Informed written consent was obtained from the patient and the patient's family after a thorough discussion of the procedural risks, benefits and alternatives. Specific risks discussed with TIPS/variceal embolization included: Bleeding, infection, vascular injury, need for further procedure/surgery, renal  injury/renal failure, contrast reaction, non-target embolization, liver dysfunction/failure, hepatic encephalopathy, stroke (~1%), cardiopulmonary collapse, death. All questions were addressed. Maximal Sterile Barrier Technique was utilized including caps, mask, sterile gowns, sterile gloves, sterile drape, hand hygiene and skin antiseptic. A timeout was performed prior to the initiation of the procedure. Patient was positioned supine position on the table, with the right upper quadrant and the right neck prepped and draped in the usual sterile fashion. General anesthesia was initiated with anesthesia team. Ultrasound survey was then performed of the right neck, with images stored and sent to PACs. Ultrasound guidance was then used to access the right internal jugular vein with a micro puncture kit. The wire was advanced under fluoroscopy into the right atrium, and a small incision was made. The needle was removed and the dilator was placed. The micro wire in the stiffener were removed and an 035 wire was then passed into the inferior vena cava. Twelve French soft tissue dilation was performed on the wire, and then 10 [place] TIPS sheath was placed. Combination of Benson wire and multipurpose angled catheter were used to select the right hepatic vein. During the selection the right hepatic vein, accessory hepatic vein was accessed with venography. Venogram confirmed location within the right hepatic vein. Tip sheath was then advanced over the catheter with a coaxial Amplatz wire, for a position cephalad to the transition point of the wire in the portal venous system. The Colapinto needle was then advanced through the TIPS sheath housed in the Teflon sheath over the Amplatz wire. The needle was then advanced into the patent parenchyma, and CO2 portography was performed with AP and right anterior oblique positions. The CO2 portography was used to estimate location of portal vein. After 3 attempts at passing the goal of  into the needle into the portal vein, the needle was withdrawn from the wire with the wire remaining in place. Ultrasound survey of the right upper quadrant was performed, with images stored and sent to PACs. Using ultrasound guidance, Chiba needle was used access the right portal system. Once we confirmed position in the portal system with portal venography, micro wire was advanced into the inferior mesenteric vein. The inner dilator was advanced over the metal stiffener into the portal system, with the inner dilator advanced over the wire. Venogram/portography confirmed are location within the portal system. The micro wire was then replaced into the inner dilator, with the transition point position at the apex of the catheter. We then replaced the TIPS sheath into the right hepatic vein with the use of cobra catheter and a Bentson wire. Bentson wire was exchanged for Amplatz wire and the Teflon housing and the colapinto needle were again advanced into the right hepatic vein. Approximately 3 more attempts were unsuccessful. Needle was removed and accentuated needle curve was placed at the tip of the needle. Approximately 3 more attempts were made to gain access into the portal vein. Britta Mccreedy wire was then passed through the Colapinto needle into the right portal vein. The Cobra catheter was then advanced through the hepatic tissue into the portal vein with the angle used to direct the Bentson wire into the main portal vein. Once the wire was in the  main portal vein, the Cobra catheter was removed over an Amplatz wire. Soft tissue tract dilation was then performed using a 6 mm x 4 cm standard balloon angioplasty. Upon deflation of the final balloon angioplasty the tip sheath was placed over the balloon into the portal vein. Pigtail marking catheter was then advanced over the wire for portal venogram. Estimated length of the tissue tract was then performed with a marking pigtail catheter and we selected a 10 mm  diameter by 80 mm length via tore stent graft. A baseline portal pressure was also measured through the pigtail catheter, estimated 25 mm mercury. Pigtail catheter was removed over an Amplatz wire. The selected stent graft was then placed through the tissue tract and sheath, into the portal vein. Partial deployment of the un covered stent was performed with withdrawal to the tissue tract. Sheath was then withdrawn. After deployment of the stent graft, 9 mm balloon angioplasty was performed along the length of the stent graft. Repeat portal pressures performed with a gradient of 14 mm-15 mm mercury. 10 mm balloon angioplasty was then performed along the length of the stent graft. Repeat pressures were performed estimating a final pressure of 13 mm mercury. Pigtail catheter was removed on a Bentson wire. Using a combination of Bentson wire, cobra catheter, and a Glidewire, the left gastric vein was accessed with angiogram performed. Coil embolization was then performed of the 2 proximal venous channels with 035 coils to stasis. No reflux after placement of the coils. Catheters and wires were removed. Hemostasis was achieved at the right internal jugular vein with manual pressure. Hemostasis was achieved at the right upper abdomen. Patient remained hemodynamically stable throughout the case. No complications with minimal blood loss. 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 Signed, Dulcy Fanny. Dellia Nims, RPVI [facility] Radiology Electronically Signed   By: Corrie Mckusick D.O.   On: 08/08/2018 07:05   Dg Chest Port 1 View  Result Date: 08/04/2018 CLINICAL DATA:  Respiratory failure EXAM: PORTABLE CHEST 1 VIEW COMPARISON:  CT chest dated 05/15/2018. FINDINGS: Right infrahilar nodular  opacity. When correlating with prior CT, this reflects distinct right middle and lower lobe nodules, suspicious for neoplasm. Left lung is clear.  No pleural effusion or pneumothorax. The heart is top-normal in size. IMPRESSION: Right infrahilar nodular opacity. When correlating with prior CT, this reflects distinct right middle and lower lobe nodules, suspicious for neoplasm. No evidence of acute cardiopulmonary disease. Electronically Signed   By: Julian Hy M.D.   On: 08/04/2018 07:50   Ir Embo Art  Lawson Fiscal Hemorr Lymph [employer] Guide Roadmapping  Result Date: 08/08/2018 CLINICAL DATA:  67 year old female with cirrhosis and portal hypertension with recurrent bleeding esophageal varices INDICATION: Recurrent bleeding esophageal varices EXAM: ULTRASOUND GUIDED ACCESS RIGHT JUGULAR VEIN FOR TRANSJUGULAR INTRAHEPATIC PORTOSYSTEMIC SHUNT PLACEMENT ULTRASOUND-GUIDED PERCUTANEOUS NEEDLE PLACEMENT, TRANSHEPATIC PORTAL VENOUS ACCESS COIL EMBOLIZATION OF LEFT GASTRIC VEIN AS MAIN CONTRIBUTION TO ESOPHAGEAL VARICEAL SYSTEM MEDICATIONS: As antibiotic prophylaxis, 2 g Ancef was ordered pre-procedure and administered intravenously within one hour of incision. One unit of platelets was also administered IV during the procedure. ANESTHESIA/SEDATION: General - as administered by the Anesthesia department CONTRAST:  80 cc Omnipaque FLUOROSCOPY TIME:  Fluoroscopy Time: 42 minutes 18 seconds (761 mGy). COMPLICATIONS: None PROCEDURE: Informed written consent was obtained from the patient and the  patient's family after a thorough discussion of the procedural risks, benefits and alternatives. Specific risks discussed with TIPS/variceal embolization included: Bleeding, infection, vascular injury, need for further procedure/surgery, renal injury/renal failure, contrast reaction, non-target embolization, liver dysfunction/failure, hepatic encephalopathy, stroke (~1%), cardiopulmonary collapse, death. All questions were  addressed. Maximal Sterile Barrier Technique was utilized including caps, mask, sterile gowns, sterile gloves, sterile drape, hand hygiene and skin antiseptic. A timeout was performed prior to the initiation of the procedure. Patient was positioned supine position on the table, with the right upper quadrant and the right neck prepped and draped in the usual sterile fashion. General anesthesia was initiated with anesthesia team. Ultrasound survey was then performed of the right neck, with images stored and sent to PACs. Ultrasound guidance was then used to access the right internal jugular vein with a micro puncture kit. The wire was advanced under fluoroscopy into the right atrium, and a small incision was made. The needle was removed and the dilator was placed. The micro wire in the stiffener were removed and an 035 wire was then passed into the inferior vena cava. Twelve French soft tissue dilation was performed on the wire, and then 10 [place] TIPS sheath was placed. Combination of Benson wire and multipurpose angled catheter were used to select the right hepatic vein. During the selection the right hepatic vein, accessory hepatic vein was accessed with venography. Venogram confirmed location within the right hepatic vein. Tip sheath was then advanced over the catheter with a coaxial Amplatz wire, for a position cephalad to the transition point of the wire in the portal venous system. The Colapinto needle was then advanced through the TIPS sheath housed in the Teflon sheath over the Amplatz wire. The needle was then advanced into the patent parenchyma, and CO2 portography was performed with AP and right anterior oblique positions. The CO2 portography was used to estimate location of portal vein. After 3 attempts at passing the goal of into the needle into the portal vein, the needle was withdrawn from the wire with the wire remaining in place. Ultrasound survey of the right upper quadrant was performed, with  images stored and sent to PACs. Using ultrasound guidance, Chiba needle was used access the right portal system. Once we confirmed position in the portal system with portal venography, micro wire was advanced into the inferior mesenteric vein. The inner dilator was advanced over the metal stiffener into the portal system, with the inner dilator advanced over the wire. Venogram/portography confirmed are location within the portal system. The micro wire was then replaced into the inner dilator, with the transition point position at the apex of the catheter. We then replaced the TIPS sheath into the right hepatic vein with the use of cobra catheter and a Bentson wire. Bentson wire was exchanged for Amplatz wire and the Teflon housing and the colapinto needle were again advanced into the right hepatic vein. Approximately 3 more attempts were unsuccessful. Needle was removed and accentuated needle curve was placed at the tip of the needle. Approximately 3 more attempts were made to gain access into the portal vein. Britta Mccreedy wire was then passed through the Colapinto needle into the right portal vein. The Cobra catheter was then advanced through the hepatic tissue into the portal vein with the angle used to direct the Bentson wire into the main portal vein. Once the wire was in the main portal vein, the Cobra catheter was removed over an Amplatz wire. Soft tissue tract dilation was then performed using a  6 mm x 4 cm standard balloon angioplasty. Upon deflation of the final balloon angioplasty the tip sheath was placed over the balloon into the portal vein. Pigtail marking catheter was then advanced over the wire for portal venogram. Estimated length of the tissue tract was then performed with a marking pigtail catheter and we selected a 10 mm diameter by 80 mm length via tore stent graft. A baseline portal pressure was also measured through the pigtail catheter, estimated 25 mm mercury. Pigtail catheter was removed over an  Amplatz wire. The selected stent graft was then placed through the tissue tract and sheath, into the portal vein. Partial deployment of the un covered stent was performed with withdrawal to the tissue tract. Sheath was then withdrawn. After deployment of the stent graft, 9 mm balloon angioplasty was performed along the length of the stent graft. Repeat portal pressures performed with a gradient of 14 mm-15 mm mercury. 10 mm balloon angioplasty was then performed along the length of the stent graft. Repeat pressures were performed estimating a final pressure of 13 mm mercury. Pigtail catheter was removed on a Bentson wire. Using a combination of Bentson wire, cobra catheter, and a Glidewire, the left gastric vein was accessed with angiogram performed. Coil embolization was then performed of the 2 proximal venous channels with 035 coils to stasis. No reflux after placement of the coils. Catheters and wires were removed. Hemostasis was achieved at the right internal jugular vein with manual pressure. Hemostasis was achieved at the right upper abdomen. Patient remained hemodynamically stable throughout the case. No complications with minimal blood loss. 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 Signed, Dulcy Fanny. Dellia Nims, RPVI [facility] Rad[facility] Electronically Signed   By: Corrie Mckusick D.O.   On: 08/08/2018 07:05   US Abdomen Limited Ruq  Result Date: 08/03/2018 CLINICAL DATA:  Cirrhosis. EXAM: ULTRASOUND ABDOMEN LIMITED RIGHT UPPER QUADRANT COMPARISON:  Ultrasound dated 05/29/2018 and PET-CT dated 03/08/2018 and CT scan of the abdomen dated 02/09/2018 FINDINGS: Gallbladder: At least 2 multiple gallstones are present. There is gallbladder wall thickening to 8 mm. There is  pericholecystic fluid. The appearance is essentially unchanged since the prior exam except for the pericholecystic fluid, which is minimal. Common bile duct: Diameter: 5.5 mm, normal. Liver: No focal lesions. Coarse echotexture of the liver parenchyma with nodularity of the surface of the liver consistent with cirrhosis. Portal vein is patent on color Doppler imaging with normal direction of blood flow towards the liver. The portal vein is distended to 17.4 mm. Slight ascites around the liver. IMPRESSION: 1. Dilated portal vein. However, there is normal flow in the portal vein. 2. Cirrhosis. 3. Chronic cholecystitis with chronic thickening of the gallbladder wall. 4. Small amount of ascites including around the gallbladder. Electronically Signed   By: Lorriane Shire M.D.   On: 08/03/2018 12:12       Subjective: Patient is feeling better, no nausea or vomiting, no chest pain. Able to ambulate. Had 4 watery bowel movements yesterday.   Discharge Exam: Vitals:   08/09/18 2036 08/10/18 0522  BP: (!) 119/59 (!) 131/59  Pulse: 67 63  Resp: 16 16  Temp: 98.5 F (36.9 C) 98 F (36.7 C)  SpO2: 97% 95%   Vitals:   08/09/18 1228 08/09/18 1512 08/09/18 2036 08/10/18 0522  BP: (!) 115/49 Marland Kitchen)  104/54 (!) 119/59 (!) 131/59  Pulse: 65 63 67 63  Resp:   16 16  Temp: 97.9 F (36.6 C) 99.6 F (37.6 C) 98.5 F (36.9 C) 98 F (36.7 C)  TempSrc: Oral Oral Oral Oral  SpO2: 91% 94% 97% 95%  Weight:    82.3 kg  Height:        General: Not in pain or dyspnea  Neurology: Awake and alert, non focal  E ENT: mid pallor, no icterus, oral mucosa moist Cardiovascular: No JVD. S1-S2 present, rhythmic, no gallops, rubs, or murmurs. No lower extremity edema. Pulmonary: vesicular breath sounds bilaterally, adequate air movement, no wheezing, rhonchi or rales. Gastrointestinal. Abdomen with no organomegaly, non tender, no rebound or guarding Skin. No rashes. Positive telangiectasias  Musculoskeletal: no joint  deformities   The results of significant diagnostics from this hospitalization (including imaging, microbiology, ancillary and laboratory) are listed below for reference.     Microbiology: Recent Results (from the past 240 hour(s))  MRSA PCR Screening     Status: None   Collection Time: 08/03/18  4:59 AM  Result Value Ref Range Status   MRSA by PCR NEGATIVE NEGATIVE Final    Comment:        The GeneXpert MRSA Assay (FDA approved for NASAL specimens only), is one component of a comprehensive MRSA colonization surveillance program. It is not intended to diagnose MRSA infection nor to guide or monitor treatment for MRSA infections. Performed at [facility], [address] [place], [place] [address]      Labs: BNP (last 3 results) No results for input(s): BNP in the last 8760 hours. Basic Metabolic Panel: Recent Labs  Lab 08/05/18 0445 08/05/18 0855 08/06/18 0601 08/07/18 0442 08/09/18 0410 08/10/18 0339  NA 142  --  142 141 140 140  K 3.5  --  3.5 3.8 3.3* 3.8  CL 114*  --  113* 114* 112* 111  CO2 21*  --  21* 17* 24 23  GLUCOSE 136*  --  135* 120* 146* 114*  BUN 23  --  _0 6*  CREATININE 0.89  --  0.78 0.80 0.77 0.65  CALCIUM 8.3*  --  8.5* 8.3* 8.0* 8.3*  MG  --  1.9  --   --   --   --    Liver Function Tests: Recent Labs  Lab 08/05/18 0445  AST 66*  ALT 60*  ALKPHOS 133*  BILITOT 0.9  PROT 5.5*  ALBUMIN 2.5*   No results for input(s): LIPASE, AMYLASE in the last 168 hours. No results for input(s): AMMONIA in the last 168 hours. CBC: Recent Labs  Lab 08/06/18 0601 08/07/18 0442 08/07/18 2030 08/08/18 0337 08/09/18 0500 08/10/18 0339  WBC 3.0* 3.4*  --  5.2 5.0 5.1  NEUTROABS 2.0 1.9  --  4.3 3.3 3.0  HGB 7.6* 8.5* 8.7* 8.4* 7.7* 8.8*  HCT 25.2* 27.9* 29.1* 27.9* 26.1* 28.7*  MCV 90.6 89.4  --  90.3 90.0 90.0  PLT PLATELET CLUMPS NOTED ON SMEAR, UNABLE TO ESTIMATE 116*  --  138* 111* 116*   Cardiac Enzymes: No results for  input(s): CKTOTAL, CKMB, CKMBINDEX, TROPONINI in the last 168 hours. BNP: Invalid input(s): POCBNP CBG: Recent Labs  Lab 08/03/18 1158 08/03/18 1528 08/05/18 2149 08/07/18 1749  GLUCAP 163* 159* 135* 102*   D-Dimer No results for input(s): DDIMER in the last 72 hours. Hgb A1c No results for input(s): HGBA1C in the last 72 hours. Lipid Profile No results  for input(s): CHOL, HDL, LDLCALC, TRIG, CHOLHDL, LDLDIRECT in the last 72 hours. Thyroid function studies No results for input(s): TSH, T4TOTAL, T3FREE, THYROIDAB in the last 72 hours.  Invalid input(s): FREET3 Anemia work up No results for input(s): VITAMINB12, FOLATE, FERRITIN, TIBC, IRON, RETICCTPCT in the last 72 hours. Urinalysis No results found for: COLORURINE, APPEARANCEUR, [place], [place], [place], [place], [place], [place], PROTEINUR, UROBILINOGEN, NITRITE, LEUKOCYTESUR Sepsis Labs Invalid input(s): PROCALCITONIN,  WBC,  LACTICIDVEN Microbiology Recent Results (from the past 240 hour(s))  MRSA PCR Screening     Status: None   Collection Time: 08/03/18  4:59 AM  Result Value Ref Range Status   MRSA by PCR NEGATIVE NEGATIVE Final    Comment:        The GeneXpert MRSA Assay (FDA approved for NASAL specimens only), is one component of a comprehensive MRSA colonization surveillance program. It is not intended to diagnose MRSA infection nor to guide or monitor treatment for MRSA infections. Performed at [facility], [address] [place], [place] [address]      Time coordinating discharge: 45 minutes  SIGNED:   Tawni Millers, MD  [facility] 08/10/2018, 9:47 AM Pager [phone]  If 7PM-7AM, please contact night-coverage [email or web address] Password [email or web address]

## 2018-08-11 LAB — TYPE AND SCREEN
ABO/RH(D): O POS
Antibody Screen: NEGATIVE
Unit division: 0
Unit division: 0

## 2018-08-11 LAB — BPAM RBC
Blood Product Expiration Date: 201910212359
Blood Product Expiration Date: 201910212359
ISSUE DATE / TIME: 201909251421
ISSUE DATE / TIME: 201909271155
Unit Type and Rh: 5100
Unit Type and Rh: 5100

## 2018-08-12 ENCOUNTER — Other Ambulatory Visit: Payer: Self-pay | Admitting: Interventional Radiology

## 2018-08-12 DIAGNOSIS — K7460 Unspecified cirrhosis of liver: Secondary | ICD-10-CM

## 2018-08-12 DIAGNOSIS — I8510 Secondary esophageal varices without bleeding: Secondary | ICD-10-CM

## 2018-08-14 DIAGNOSIS — K766 Portal hypertension: Secondary | ICD-10-CM | POA: Diagnosis not present

## 2018-08-14 DIAGNOSIS — R911 Solitary pulmonary nodule: Secondary | ICD-10-CM | POA: Diagnosis not present

## 2018-08-14 DIAGNOSIS — K7581 Nonalcoholic steatohepatitis (NASH): Secondary | ICD-10-CM | POA: Diagnosis not present

## 2018-08-14 DIAGNOSIS — Z7984 Long term (current) use of oral hypoglycemic drugs: Secondary | ICD-10-CM | POA: Diagnosis not present

## 2018-08-14 DIAGNOSIS — K760 Fatty (change of) liver, not elsewhere classified: Secondary | ICD-10-CM | POA: Diagnosis not present

## 2018-08-14 DIAGNOSIS — K7469 Other cirrhosis of liver: Secondary | ICD-10-CM | POA: Diagnosis not present

## 2018-08-14 DIAGNOSIS — B182 Chronic viral hepatitis C: Secondary | ICD-10-CM | POA: Diagnosis not present

## 2018-08-14 DIAGNOSIS — E119 Type 2 diabetes mellitus without complications: Secondary | ICD-10-CM | POA: Diagnosis not present

## 2018-08-14 DIAGNOSIS — I8511 Secondary esophageal varices with bleeding: Secondary | ICD-10-CM | POA: Diagnosis not present

## 2018-08-15 ENCOUNTER — Inpatient Hospital Stay: Payer: Medicare HMO | Attending: Hematology and Oncology

## 2018-08-15 ENCOUNTER — Ambulatory Visit
Admission: RE | Admit: 2018-08-15 | Discharge: 2018-08-15 | Disposition: A | Discharge status: Home / Self Care | Payer: Medicare HMO | Source: Ambulatory Visit | Attending: Hematology and Oncology | Admitting: Hematology and Oncology

## 2018-08-15 ENCOUNTER — Other Ambulatory Visit: Payer: Self-pay | Admitting: *Deleted

## 2018-08-15 DIAGNOSIS — D500 Iron deficiency anemia secondary to blood loss (chronic): Secondary | ICD-10-CM

## 2018-08-15 DIAGNOSIS — D510 Vitamin B12 deficiency anemia due to intrinsic factor deficiency: Secondary | ICD-10-CM | POA: Diagnosis not present

## 2018-08-15 DIAGNOSIS — I700 Atherosclerosis of aorta: Secondary | ICD-10-CM | POA: Insufficient documentation

## 2018-08-15 DIAGNOSIS — E119 Type 2 diabetes mellitus without complications: Secondary | ICD-10-CM | POA: Insufficient documentation

## 2018-08-15 DIAGNOSIS — D508 Other iron deficiency anemias: Secondary | ICD-10-CM

## 2018-08-15 DIAGNOSIS — D509 Iron deficiency anemia, unspecified: Secondary | ICD-10-CM | POA: Diagnosis not present

## 2018-08-15 DIAGNOSIS — I2510 Atherosclerotic heart disease of native coronary artery without angina pectoris: Secondary | ICD-10-CM | POA: Diagnosis not present

## 2018-08-15 DIAGNOSIS — R918 Other nonspecific abnormal finding of lung field: Secondary | ICD-10-CM

## 2018-08-15 DIAGNOSIS — J90 Pleural effusion, not elsewhere classified: Secondary | ICD-10-CM | POA: Insufficient documentation

## 2018-08-15 LAB — FERRITIN: Ferritin: 374 ng/mL — ABNORMAL HIGH (ref 11–307)

## 2018-08-16 ENCOUNTER — Other Ambulatory Visit: Payer: Self-pay | Admitting: *Deleted

## 2018-08-16 DIAGNOSIS — B182 Chronic viral hepatitis C: Secondary | ICD-10-CM | POA: Diagnosis not present

## 2018-08-16 DIAGNOSIS — Z7984 Long term (current) use of oral hypoglycemic drugs: Secondary | ICD-10-CM | POA: Diagnosis not present

## 2018-08-16 DIAGNOSIS — I8511 Secondary esophageal varices with bleeding: Secondary | ICD-10-CM | POA: Diagnosis not present

## 2018-08-16 DIAGNOSIS — E119 Type 2 diabetes mellitus without complications: Secondary | ICD-10-CM | POA: Diagnosis not present

## 2018-08-16 DIAGNOSIS — D500 Iron deficiency anemia secondary to blood loss (chronic): Secondary | ICD-10-CM

## 2018-08-16 DIAGNOSIS — K766 Portal hypertension: Secondary | ICD-10-CM | POA: Diagnosis not present

## 2018-08-16 DIAGNOSIS — K7469 Other cirrhosis of liver: Secondary | ICD-10-CM | POA: Diagnosis not present

## 2018-08-16 DIAGNOSIS — K760 Fatty (change of) liver, not elsewhere classified: Secondary | ICD-10-CM | POA: Diagnosis not present

## 2018-08-16 DIAGNOSIS — R911 Solitary pulmonary nodule: Secondary | ICD-10-CM | POA: Diagnosis not present

## 2018-08-16 DIAGNOSIS — K7581 Nonalcoholic steatohepatitis (NASH): Secondary | ICD-10-CM | POA: Diagnosis not present

## 2018-08-19 DIAGNOSIS — K766 Portal hypertension: Secondary | ICD-10-CM | POA: Diagnosis not present

## 2018-08-19 DIAGNOSIS — K760 Fatty (change of) liver, not elsewhere classified: Secondary | ICD-10-CM | POA: Diagnosis not present

## 2018-08-19 DIAGNOSIS — Z7984 Long term (current) use of oral hypoglycemic drugs: Secondary | ICD-10-CM | POA: Diagnosis not present

## 2018-08-19 DIAGNOSIS — R911 Solitary pulmonary nodule: Secondary | ICD-10-CM | POA: Diagnosis not present

## 2018-08-19 DIAGNOSIS — I8511 Secondary esophageal varices with bleeding: Secondary | ICD-10-CM | POA: Diagnosis not present

## 2018-08-19 DIAGNOSIS — K7469 Other cirrhosis of liver: Secondary | ICD-10-CM | POA: Diagnosis not present

## 2018-08-19 DIAGNOSIS — B182 Chronic viral hepatitis C: Secondary | ICD-10-CM | POA: Diagnosis not present

## 2018-08-19 DIAGNOSIS — K7581 Nonalcoholic steatohepatitis (NASH): Secondary | ICD-10-CM | POA: Diagnosis not present

## 2018-08-19 DIAGNOSIS — E119 Type 2 diabetes mellitus without complications: Secondary | ICD-10-CM | POA: Diagnosis not present

## 2018-08-20 DIAGNOSIS — E7800 Pure hypercholesterolemia, unspecified: Secondary | ICD-10-CM | POA: Diagnosis not present

## 2018-08-20 DIAGNOSIS — E119 Type 2 diabetes mellitus without complications: Secondary | ICD-10-CM | POA: Diagnosis not present

## 2018-08-20 DIAGNOSIS — K922 Gastrointestinal hemorrhage, unspecified: Secondary | ICD-10-CM | POA: Diagnosis not present

## 2018-08-21 ENCOUNTER — Inpatient Hospital Stay (HOSPITAL_BASED_OUTPATIENT_CLINIC_OR_DEPARTMENT_OTHER): Payer: Medicare HMO | Admitting: Hematology and Oncology

## 2018-08-21 ENCOUNTER — Inpatient Hospital Stay: Payer: Medicare HMO

## 2018-08-21 ENCOUNTER — Other Ambulatory Visit: Payer: Self-pay | Admitting: Hematology and Oncology

## 2018-08-21 ENCOUNTER — Encounter: Payer: Self-pay | Admitting: Hematology and Oncology

## 2018-08-21 VITALS — BP 127/46 | HR 51 | Temp 97.4°F | Resp 18 | Ht 62.99 in | Wt 169.8 lb

## 2018-08-21 DIAGNOSIS — K7460 Unspecified cirrhosis of liver: Secondary | ICD-10-CM

## 2018-08-21 DIAGNOSIS — R911 Solitary pulmonary nodule: Secondary | ICD-10-CM | POA: Diagnosis not present

## 2018-08-21 DIAGNOSIS — K5520 Angiodysplasia of colon without hemorrhage: Secondary | ICD-10-CM

## 2018-08-21 DIAGNOSIS — D500 Iron deficiency anemia secondary to blood loss (chronic): Secondary | ICD-10-CM

## 2018-08-21 DIAGNOSIS — R188 Other ascites: Secondary | ICD-10-CM | POA: Diagnosis not present

## 2018-08-21 DIAGNOSIS — D509 Iron deficiency anemia, unspecified: Secondary | ICD-10-CM | POA: Diagnosis not present

## 2018-08-21 DIAGNOSIS — E538 Deficiency of other specified B group vitamins: Secondary | ICD-10-CM

## 2018-08-21 DIAGNOSIS — D508 Other iron deficiency anemias: Secondary | ICD-10-CM

## 2018-08-21 DIAGNOSIS — D510 Vitamin B12 deficiency anemia due to intrinsic factor deficiency: Secondary | ICD-10-CM | POA: Diagnosis not present

## 2018-08-21 DIAGNOSIS — E119 Type 2 diabetes mellitus without complications: Secondary | ICD-10-CM | POA: Diagnosis not present

## 2018-08-21 DIAGNOSIS — J90 Pleural effusion, not elsewhere classified: Secondary | ICD-10-CM | POA: Diagnosis not present

## 2018-08-21 LAB — CBC WITH DIFFERENTIAL/PLATELET
Abs Immature Granulocytes: 0.02 10*3/uL (ref 0.00–0.07)
Basophils Absolute: 0.1 10*3/uL (ref 0.0–0.1)
Basophils Relative: 1 %
Eosinophils Absolute: 0.2 10*3/uL (ref 0.0–0.5)
Eosinophils Relative: 5 %
HCT: 40.5 % (ref 36.0–46.0)
Hemoglobin: 12.2 g/dL (ref 12.0–15.0)
Immature Granulocytes: 0 %
Lymphocytes Relative: 37 %
Lymphs Abs: 1.7 10*3/uL (ref 0.7–4.0)
MCH: 27.5 pg (ref 26.0–34.0)
MCHC: 30.1 g/dL (ref 30.0–36.0)
MCV: 91.2 fL (ref 80.0–100.0)
Monocytes Absolute: 0.4 10*3/uL (ref 0.1–1.0)
Monocytes Relative: 10 %
Neutro Abs: 2.1 10*3/uL (ref 1.7–7.7)
Neutrophils Relative %: 47 %
Platelets: 153 10*3/uL (ref 150–400)
RBC: 4.44 MIL/uL (ref 3.87–5.11)
RDW: 21.2 % — ABNORMAL HIGH (ref 11.5–15.5)
WBC: 4.6 10*3/uL (ref 4.0–10.5)
nRBC: 0 % (ref 0.0–0.2)

## 2018-08-21 LAB — IRON AND TIBC
Iron: 115 ug/dL (ref 28–170)
Saturation Ratios: 58 % — ABNORMAL HIGH (ref 10.4–31.8)
TIBC: 198 ug/dL — ABNORMAL LOW (ref 250–450)
UIBC: 83 ug/dL

## 2018-08-21 LAB — FERRITIN: Ferritin: 485 ng/mL — ABNORMAL HIGH (ref 11–307)

## 2018-08-21 NOTE — Progress Notes (Addendum)
[facility] day:  08/21/2018  Chief Complaint: Crystal [name] is a 67 y.o. female with 2 right sided pulmonary nodules and lymphadenopathy who seen for review of interval chest CT and discussion regarding direction of therapy.  HPI:  The patient was last seen in the medical oncology clinic on 07/10/2018.  At that time, she noted chronic shortness of breath.  She described fatigue, but was active.  She was taking iron 1/day x 3 weeks.  We discussed repeat chest imaging and consideration of IV iron.  She declined any pulmonary intervention until after Christmas.  She received Venofer weekly x 4 (07/12/2018 - 07/31/2018).  She has continued monthly B12 (last 07/31/2018).  The patient was admitted to [facility] from 08/02/2018 - 08/03/2018 with an upper GI bleed after presenting with large bloody emesis. Upper endoscopy by Dr. Marius Ditch revealed moderate portal hypertensive gastropathy in the entire examined stomach.  There were 3 columns of large (> 5 mm) varices in the lower third of the esophagus with stigmata of recent bleeding and red wale signs present. Scarring from prior treatment was visible. One varix started bleeding actively during the procedure. Banding was not successful due to extensive scarring, hemospray was performed, there was no bleeding at the end of procedure.  Decision was made to transfer her to [facility] for urgent TIPS.  She was admitted to [facility] from 08/03/2018 - 08/10/2018.  She had decompensated cirrhosis, MELD score was 9.  She underwent transjugular intrahepatic portosystemic shunt (TIPS) and coil embolization of the left gastric vein on 08/07/2018.  Nadir hemoglobin was 7.7.  She received 1 unit of packed red blood cells.  Discharge hemoglobin was 8.8 with a hematocrit of 28.7.  She was to continue propranolol, Lasix, and aldactone.  She was discharged on pantoprazole 40 mg twice daily.  RUQ ultrasound on 08/03/2018 revealed dilated  portal vein. There was normal flow in the portal vein.  There was cirrhosis, chronic cholecystitis with chronic thickening of the gallbladder wall, and a small amount of ascites including around the gallbladder.  Abdomen and pelvic CT on 08/05/2018 revealed small varices in the distal esophagus and GE junction associated with the left gastric vein. Portal venous system was patent.  There were cirrhotic changes with portal hypertension demonstrated by esophageal varices, ascites and splenomegaly.  There was stable abdominal lymphadenopathy which was likely secondary to cirrhosis.  There was no significant change in the right lower lobe pulmonary nodule.  There was gas within the urinary bladder, nonspecific and could be iatrogenic.  There was no change in the large left adnexal/ovarian cyst.  There was cholelithiasis.  Chest CT on 08/15/2018 revealed the spiculated right lower lobe lung lesion appeared slightly increased and was not significantly changed in size compared with previous exam.  Morphologically, this was worrisome for a small bronchogenic neoplasm.  The right middle lobe lung nodule was slightly decreased in size in the interval and may be post infectious or inflammatory in etiology.  During the interim, patient has been fatigued. She spends her days "going to doctors". When she is at home, she is resting. She states, "I just wish that I felt better". She continues to be exertionally dyspneic. Patient denies bleeding; no hematochezia, melena, or gross hematuria. She is able to eat and drink normally following her procedure. Patient denies that she has experienced any B symptoms. She denies any interval infections.   Patient advises that she maintains an adequate appetite. She is  eating well. Weight today is 169 lb 12.1 oz (77 kg), which compared to her last visit to the clinic, represents a stable weight.  Patient denies pain in the clinic today.   Past Medical History:  Diagnosis Date  .  Anemia    TAKES IRON TAB  . Arthritis    SHOULDER  . Bleeding    GI  3/19  . Bronchitis    HX OF  . Cirrhosis ([place])   . Diabetes mellitus without complication ([place])    TYPE 2  . Fatty (change of) liver, not elsewhere classified   . Full dentures    UPPER AND LOWER  . Lung nodule, multiple     Past Surgical History:  Procedure Laterality Date  . CATARACT EXTRACTION W/PHACO Right 02/06/2018   Procedure: CATARACT EXTRACTION PHACO AND INTRAOCULAR LENS PLACEMENT ([place]) RIGHT DIABETIC;  Surgeon: Leandrew Koyanagi, MD;  Location: [facility];  Service: Ophthalmology;  Laterality: Right;  DIABETIC, ORAL MED  . CATARACT EXTRACTION W/PHACO Left 04/30/2018   Procedure: CATARACT EXTRACTION PHACO AND INTRAOCULAR LENS PLACEMENT ([facility]);  Surgeon: Leandrew Koyanagi, MD;  Location: [facility];  Service: Ophthalmology;  Laterality: Left;  [place] 01:04 AP% 21.3 CDE 13.66 Fluid pack lot # [id number]  . COLONOSCOPY WITH PROPOFOL N/A 03/25/2018   Procedure: COLONOSCOPY WITH PROPOFOL;  Surgeon: Lin Landsman, MD;  Location: [facility] ENDOSCOPY;  Service: Gastroenterology;  Laterality: N/A;  . DENTAL SURGERY     EXTRACTIONS  . ESOPHAGOGASTRODUODENOSCOPY N/A 08/03/2018   Procedure: ESOPHAGOGASTRODUODENOSCOPY (EGD);  Surgeon: Lin Landsman, MD;  Location: [facility] ENDOSCOPY;  Service: Gastroenterology;  Laterality: N/A;  . ESOPHAGOGASTRODUODENOSCOPY (EGD) WITH PROPOFOL N/A 02/10/2018   Procedure: ESOPHAGOGASTRODUODENOSCOPY (EGD) WITH PROPOFOL;  Surgeon: Jonathon Bellows, MD;  Location: [facility] ENDOSCOPY;  Service: Gastroenterology;  Laterality: N/A;  . ESOPHAGOGASTRODUODENOSCOPY (EGD) WITH PROPOFOL N/A 03/25/2018   Procedure: ESOPHAGOGASTRODUODENOSCOPY (EGD) WITH PROPOFOL;  Surgeon: Lin Landsman, MD;  Location[facility] ENDOSCOPY;  Service: Gastroenterology;  Laterality: N/A;  . ESOPHAGOGASTRODUODENOSCOPY (EGD) WITH PROPOFOL N/A 04/22/2018   Procedure: ESOPHAGOGASTRODUODENOSCOPY (EGD) WITH PROPOFOL with band  ligation;  Surgeon: Lin Landsman, MD;  Location: [facility];  Service: Gastroenterology;  Laterality: N/A;  . ESOPHAGOGASTRODUODENOSCOPY (EGD) WITH PROPOFOL N/A 06/24/2018   Procedure: ESOPHAGOGASTRODUODENOSCOPY (EGD) WITH PROPOFOL;  Surgeon: Lin Landsman, MD;  Location: [facility] ENDOSCOPY;  Service: Gastroenterology;  Laterality: N/A;  . EYE SURGERY    . HEMORRHOID BANDING  03/25/2018   Procedure: HEMORRHOID BANDING;  Surgeon: Lin Landsman, MD;  Location: [facility] ENDOSCOPY;  Service: Gastroenterology;;  . IR EMBO ART  VEN HEMORR LYMPH EXTRAV  INC GUIDE ROADMAPPING  08/07/2018  . IR TIPS  08/07/2018  . RADIOLOGY WITH ANESTHESIA N/A 08/07/2018   Procedure: TIPS;  Surgeon: Corrie Mckusick, DO;  Location: [facility];  Service: Anesthesiology;  Laterality: N/A;    Family History  Problem Relation Age of Onset  . CAD Father   . Cancer Maternal Uncle     Social History:  reports that she has never smoked. She has never used smokeless tobacco. She reports that she does not drink alcohol or use drugs.  Patient denies known exposures to radiation on toxins. She retired from [employer] in 11/2017.  The patient is alone today.  Allergies: No Known Allergies  Current Medications: Current Outpatient Medications  Medication Sig Dispense Refill  . cyanocobalamin (,VITAMIN B-12,) 1000 MCG/ML injection Inject 1,000 mcg into the muscle once.    . docusate sodium (COLACE) 100 MG capsule Take 100 mg by mouth  daily.    . furosemide (LASIX) 20 MG tablet Take 2 tablets (40 mg total) by mouth daily. 180 tablet 1  . lactulose (CHRONULAC) 10 GM/15ML solution Take 30 mLs (20 g total) by mouth 2 (two) times daily. 1800 mL 0  . metFORMIN (GLUCOPHAGE) 500 MG tablet Take 1,000 mg by mouth 2 (two) times daily with a meal.     . pantoprazole (PROTONIX) 40 MG tablet Take 1 tablet (40 mg total) by mouth 2 (two) times daily. 60 tablet 0  . spironolactone (ALDACTONE) 50 MG tablet Take 2 tablets (100 mg total) by  mouth daily. 180 tablet 1  . nystatin (MYCOSTATIN/NYSTOP) powder Apply 1 g topically as needed (rash).     . ondansetron (ZOFRAN) 4 MG tablet Take 1 tablet (4 mg total) by mouth every 6 (six) hours as needed for nausea. (Patient not taking: Reported on 08/21/2018) 20 tablet 0  . propranolol (INDERAL) 20 MG tablet Take 1 tablet (20 mg total) by mouth 2 (two) times daily. 180 tablet 1   No current facility-administered medications for this visit.     Review of Systems  Constitutional: Positive for malaise/fatigue. Negative for diaphoresis, fever and weight loss (stable).  HENT: Negative.   Eyes: Negative.        Previous cataract surgery to left eye on 04/30/2018  Respiratory: Negative for cough, hemoptysis, sputum production and shortness of breath (exertional).   Cardiovascular: Positive for leg swelling. Negative for chest pain, palpitations, orthopnea and PND.  Gastrointestinal: Positive for abdominal pain (related to distention associated with underlying cirrhosis), constipation (improved with MiraLAX) and heartburn. Negative for blood in stool, diarrhea, melena, nausea and vomiting.       Known portal hypertensive gastropathy.  Recent varices ligation.  Genitourinary: Negative for dysuria, frequency, hematuria and urgency.  Musculoskeletal: Negative for back pain, falls, joint pain and myalgias.  Skin: Negative for itching and rash.  Neurological: Negative for dizziness, tremors, weakness and headaches.  Endo/Heme/Allergies: Does not bruise/bleed easily.       PMH (+) for diabetes - on metformin  Psychiatric/Behavioral: Negative for depression, memory loss and suicidal ideas. The patient is not nervous/anxious and does not have insomnia.   All other systems reviewed and are negative.  Performance status (ECOG): 1 - Symptomatic but completely ambulatory  Vital Signs BP (!) 127/46 (BP Location: Left Arm, Patient Position: Sitting)   Pulse (!) 51   Temp (!) 97.4 F (36.3 C) (Tympanic)    Resp 18   Ht 5' 2.99" (1.6 m)   Wt 169 lb 12.1 oz (77 kg)   BMI 30.08 kg/m   Physical Exam  Constitutional: She is oriented to person, place, and time and well-developed, well-nourished, and in no distress. No distress.  HENT:  Head: Normocephalic and atraumatic.  Mouth/Throat: Oropharynx is clear and moist and mucous membranes are normal. No oropharyngeal exudate.  White hair.  Eyes: Pupils are equal, round, and reactive to light. Conjunctivae and EOM are normal. No scleral icterus.  Glasses.  Blue eyes.  Neck: Normal range of motion. Neck supple. No JVD present.  Cardiovascular: Normal rate, regular rhythm, normal heart sounds and intact distal pulses. Exam reveals no gallop and no friction rub.  No murmur heard. Pulmonary/Chest: Effort normal and breath sounds normal. No respiratory distress. She has no wheezes. She has no rales.  Abdominal: Soft. Bowel sounds are normal. She exhibits ascites (minimal). She exhibits no distension. There is no tenderness.  Musculoskeletal: Normal range of motion. She exhibits no edema  or tenderness.  Hands purple in color (? Raynaud's syndrome).  Lymphadenopathy:    She has no cervical adenopathy.    She has no axillary adenopathy.       Right: No inguinal and no supraclavicular adenopathy present.       Left: No inguinal and no supraclavicular adenopathy present.  Neurological: She is alert and oriented to person, place, and time.  Skin: Skin is warm and dry. No rash noted. She is not diaphoretic. No erythema.  Psychiatric: Mood, affect and judgment normal.  Nursing note and vitals reviewed.    No visits with results within 3 Day(s) from this visit.  Latest known visit with results is:  Appointment on 08/15/2018  Component Date Value Ref Range Status  . Ferritin 08/15/2018 374* 11 - 307 ng/mL Final   Performed at [facility], [address] [place], [place] [address]    Assessment:  Peytyn Trine is a 67 y.o. female with  a right lower lobe pulmonary nodule.  She denies any smoking history.  Abdomen and pelvic CT on 02/09/2018 revealed an 1.8 cm spiculated density in right lower lobe concerning for malignancy. There was mildly nodular hepatic contour concerning for hepatic cirrhosis.  There was moderate splenomegaly suggesting portal venous hypertension.  Mild ascites was noted.  There were mildly enlarged retroperitoneal lymph nodes (largest 1.1 cm) concerning for possible metastatic disease or malignancy.  There was a 7 cm left ovarian cyst.  Further evaluation with MRI was recommended to evaluate for possible neoplasm.  CA125 was 351.3 and CEA 1.1 on 02/21/2018.   Chest CT on 02/25/2018 revealed a 2.3 x 1.5 x 1.0 cm perifissural nodule with spiculated margins in the right middle lobe.  There was a 1.5 x 1.5 x 1.4 cm right lower lobe spiculated nodule.  There was a moderate left sided pleural effusion.    PET scan on 03/08/2018 revealed a 2.5 x 1.8 cm sub solid nodular opacity in the RML (SUV 2.7) and a 1.6 cm rounded slightly spiculated RLL pulmonary nodule (SUV 2.1).  There were no enlarged or hypermetabolic mediastinal or hilar lymph nodes.  There was a small left pleural effusion.  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  Chest CT on 05/15/2018 revealed no significant changes in the 2 previously noted nodules in the RIGHT lung. Irregular RML lesion measures 2.4 x 1.4 cm (previously 2.3 x 1.5 cm). Spiculated central RLL lesion measures 1.8 x 1.4 cm (previously 1.6 x 1.5 cm). Both areas remain concerning for neoplasm. There was interval decrease in ascites volume overall. Upper abdominal lymphadenopathy noted that was felt to be reactive due to underlying liver disease.    Chest CT on 08/15/2018 revealed the spiculated right lower lobe lung lesion appeared slightly increased and was not significantly changed in size compared with  previous exam.  Morphologically, this was worrisome for a small bronchogenic neoplasm.  The right middle lobe lung nodule was slightly decreased in size in the interval and may be post infectious or inflammatory in etiology.  She was admitted to [facility] from 02/09/2018 - 02/12/2018 with a variceal hemorrhage.  EGD on 02/10/2018 revealed 5 columns of grade III esophageal varices in the lower third of the esophagus.  There was stigmata of recent bleeding and red wale signs.  She underwent variceal ligation x 10.   She required 3 units of PRBCs.  EGD on 03/25/2018 revealed 2 angiectasias (non-bleeding) in the second portion of  the duodenum, and a few diminutive (non-bleeding) angiectasias in the prepyloric region of the stomach that were treated APC. There were 4 non-bleeding ulcers (Forrest Class III)  found in the gastric fundus. There were 4 columns of large non-bleeding varices in the lower third of the esophagus, of which demonstrated no stigmata of bleeding.  Varices were banded.  EGD on 04/22/2018  revealed one non-bleeding cratered gastric ulcer (Forrest Class III) in the lesser curvature of the gastric body. Duodenal bulb and second portion of the duodenum were normal. Moderate portal hypertensive gastropathy noted in the stomach. Large varices in the lower third of the esophagus noted. 2 bands were placed with incomplete eradication of the lesions.   Colonoscopy on 03/25/2018 revealed a single 5 mm sessile polyp in the ascending colon. Patient has rectal varices and external hemorrhoids. Pathology returned as tubular adenoma, and was negative for high grade dysplasia and malignancy.   She has iron deficiency anemia.  Ferritin was 6.  She is on ferrous sulfate 325 mg TID.  She has B12 deficiency.  B12 was 103.   Intrinsic factor antibody was negative.  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  She began B12 injections on 03/06/2018 (last 07/31/2018).  Folate was 14.3 on  02/09/2018.  B12 was 485 on 05/24/2018.  Diet is good.  She received Venofer weekly x 4 (07/12/2018 - 07/31/2018).    Ferritin has been followed: 6 on 02/09/2018, 16 on 05/24/2018, 13 on 07/10/2018, and 374 on 08/15/2018.  She has hepatitis C antibodies.  HCV RNA was not detected. She has been exposed to hepatitis B.  Anti-smooth muscle antibodies are weakly positive.  ANA was positive (> 1:1280 centromere antibody).  Negative studies included: ceruloplasmin, anti-mitochondrial antibodies, hepatitis B IgM, HIV, and alpha-1 antitrypsin.  PT was 15.2 (INR 1.21).  AFP was 1.5 on 05/28/2018.  RUQ ultrasound on 05/29/2018 revealed cholelithiasis. There was a thickened edematous gallbladder wall. The wall thickening could be due to hypoproteinemia/hypoalbuminemia or cholecystitis. Negative sonographic Murphy's sign.  There was a heterogeneous slightly nodular liver consistent with cirrhosis.  Pelvic ultrasound  on 03/18/2018  demonstrated a simple cyst in the LEFT ovary measuring 6.1 x 4.5 x 6.6 cm. Repeat imaging recommended in 1 year to document stability.   Patient underwent cataract surgery on her LEFT eye on 04/30/2018.  Symptomatically, she is doing well.  She is recovering well from her recent TIPS procedure.  Patient complains of exertional shortness of breath.  Exam reveals mild ascites and lower extremity edema.  She has stigmata of liver disease.  Plan: 1. Labs today:  CBC with diff, ferritin, iron studies, retic. 2. Decompensated cirrhosis s/p TIPS  Patient is doing well following her recent TIPS procedure.  She denies any recurrent gastrointestinal bleeding.  Patient followed by gastroenterology.  Follow-up as already scheduled for continuing care and evaluation. 3. Pulmonary nodules  Review chest CT. Imaging personally reviewed and felt to be consistent with the dictated radiology report. Imaging reviewed with patient.   Spiculated right lower lobe lung lesion appeared slightly  increased and was not significantly changed in size compared with previous exam.   Morphologically, worrisome for a small bronchogenic neoplasm.    RML lung nodule was slightly decreased in size in the interval and may be post infectious or inflammatory in etiology  Initially agreed to see pulmonary medicine Mortimer Fries, MD) in consult for consideration of bronchoscopy.  GI issues prevented consult from being completed.  Patient encouraged to follow-up with pulmonary  medicine for further evaluation and potential biopsy. 4. Iron deficiency anemia  Labs reviewed.  IMA globin 12.2, hematocrit 40.5, MCV 91.2, and platelets 153,000.  Ferritin 485.  Goal 100.  No intravenous iron required today. 5. B12 deficiency  Continues on monthly parenteral B12 injection.  Last injection was given on 07/31/2018.  B12 today, then monthly. 6. RTC in 6 weeks for labs (CBC with differential, ferritin). 7. RTC in 3 months for MD assessment, labs (CBC with differential, CMP, ferritin), and further discussion regarding direction of therapy.   Honor Loh, NP  08/21/2018, 1:40 PM   I saw and evaluated the patient, participating in the [place] portions of the service and reviewing pertinent diagnostic studies and records.  I reviewed the nurse practitioner's note and agree with the findings and the plan.  The assessment and plan were discussed with the patient.  Multiple questions were asked by the patient and answered.   Nolon Stalls, MD 08/21/2018, 1:40 PM

## 2018-08-23 DIAGNOSIS — I8511 Secondary esophageal varices with bleeding: Secondary | ICD-10-CM | POA: Diagnosis not present

## 2018-08-23 DIAGNOSIS — K760 Fatty (change of) liver, not elsewhere classified: Secondary | ICD-10-CM | POA: Diagnosis not present

## 2018-08-23 DIAGNOSIS — K766 Portal hypertension: Secondary | ICD-10-CM | POA: Diagnosis not present

## 2018-08-23 DIAGNOSIS — K7581 Nonalcoholic steatohepatitis (NASH): Secondary | ICD-10-CM | POA: Diagnosis not present

## 2018-08-23 DIAGNOSIS — B182 Chronic viral hepatitis C: Secondary | ICD-10-CM | POA: Diagnosis not present

## 2018-08-23 DIAGNOSIS — Z7984 Long term (current) use of oral hypoglycemic drugs: Secondary | ICD-10-CM | POA: Diagnosis not present

## 2018-08-23 DIAGNOSIS — E119 Type 2 diabetes mellitus without complications: Secondary | ICD-10-CM | POA: Diagnosis not present

## 2018-08-23 DIAGNOSIS — K7469 Other cirrhosis of liver: Secondary | ICD-10-CM | POA: Diagnosis not present

## 2018-08-23 DIAGNOSIS — R911 Solitary pulmonary nodule: Secondary | ICD-10-CM | POA: Diagnosis not present

## 2018-08-27 ENCOUNTER — Ambulatory Visit (INDEPENDENT_AMBULATORY_CARE_PROVIDER_SITE_OTHER): Payer: Medicare HMO | Admitting: Gastroenterology

## 2018-08-27 ENCOUNTER — Telehealth: Payer: Self-pay | Admitting: Gastroenterology

## 2018-08-27 ENCOUNTER — Encounter: Payer: Self-pay | Admitting: Gastroenterology

## 2018-08-27 ENCOUNTER — Other Ambulatory Visit: Payer: Self-pay

## 2018-08-27 VITALS — BP 136/74 | HR 60 | Resp 16 | Ht 62.9 in | Wt 166.0 lb

## 2018-08-27 DIAGNOSIS — K7290 Hepatic failure, unspecified without coma: Secondary | ICD-10-CM | POA: Diagnosis not present

## 2018-08-27 DIAGNOSIS — Z95828 Presence of other vascular implants and grafts: Secondary | ICD-10-CM

## 2018-08-27 DIAGNOSIS — K7469 Other cirrhosis of liver: Secondary | ICD-10-CM

## 2018-08-27 DIAGNOSIS — K7682 Hepatic encephalopathy: Secondary | ICD-10-CM

## 2018-08-27 MED ORDER — RIFAXIMIN 550 MG PO TABS: 550.0000 mg | ORAL_TABLET | Freq: Two times a day (BID) | ORAL | 1 refills | Status: DC

## 2018-08-27 NOTE — Telephone Encounter (Signed)
PT is calling she was in today and the rx called in needs to be over written

## 2018-08-27 NOTE — Progress Notes (Signed)
[name], MD [address]  [place]  [place], [place] [address]  Main: [phone]  Fax: [phone]    Gastroenterology Consultation  Referring Provider:     Lorelee Market, MD Primary Care Physician:  Lorelee Market, MD Primary Gastroenterologist:  Dr. [name] Reason for Consultation:     Decompensated cirrhosis        HPI:   Shadia Larose Dicker is a 67 y.o. female referred by Dr. Lorelee Market, MD  for consultation & management of decompensated cirrhosis. Patient was recently diagnosed with decompensated cirrhosis, when she presented to [facility] on 02/09/2018 with large volume hematemesis secondary to variceal hemorrhage, underwent upper endoscopy on 02/10/2018 by Dr. Vicente Males, found to have large esophageal varices with red wale signs and underwent variceal ligation x 10. Her bleeding resolved by the time of discharge. Patient was found to have hepatitis C antibodies and HCV virus was not detected. She had other secondary liver disease workup which revealed weakly positive anti-smooth muscle antibodies. She has long-standing history of diabetes, on metformin. She has severe iron deficiency and B12 deficiency, discharged on oral iron and oral B12 supplementation. She was supposed to start nadolol but she did not get prescription filled due to high cost. Patient was just discharged from the hospital 2 days ago  Interval summary: since discharge, patient has been doing fairly well. She denies hematemesis, melena, rectal bleeding. She reports worsening of abdominal distention and swelling of legs which started prior to hospitalization. She is not on diuretics. She is taking iron and B12 supplements, omeprazole.she reports her stools are dark but formed. She wants to know if she can restart drinking Coke. She is not on a low-sodium diet. She denies history of heavy smoking or heavy alcohol use in the past. She denies easy bruising, altered  sleep cycle   Follow-up visit 02/22/2018 Patient is seen by Dr. Mike Gip on it [place] for lung nodule as well as retroperitoneal lymphadenopathy. Workup is in progress. Patient is here for follow-up with regards to her decompensated cirrhosis. She is adhering to medications that I recommended during last visit. She is taking Lasix 20 mg, spironolactone 50 mg, propranolol 20 mg twice a day.She is adhering to a low-salt diet, denies swelling of legs. She has developed cellulitis in her left lower leg and is being started on antibiotic. She says her abdominal distention is improving. She denies melena, hematochezia, hematemesis. She is taking oral iron 3 pills daily and vitamin B12 daily by mouth. She reports that she has been constipated and taking Colace. She otherwise denies any complaints today. She said she had given blood samples for the labs that I ordered last week but I do not see any results. We're calling the lab to find out the status  Follow-up visit 03/15/2018 Patient is seen by Dr. Mike Gip yesterday, undergoing evaluation for lung lesion, plan is to repeat CT scan in 53month There is no concern for retroperitoneal lymphadenopathy. She is scheduled for pelvic ultrasound. She has lost about 27 pounds in last 3 weeks, mostly from aggressive diuresis as she was severely volume overloaded when he saw her in clinic on 02/22/2018. At that time, I increased her diuretics dose. She reports feeling fatigued. Reports that her stools are blackish brown as she is taking oral iron 3 pills daily along with MiraLAX. She denies hematemesis, rectal bleeding. She is adherent to low-salt diet  Follow-up visit 04/12/2018 Patient underwent upper endoscopy with variceal ligation without any  complications. She regained 12 pounds back from worsening ascites and swelling of legs. She is currently on Lasix 20 mg and spironolactone 50 mg daily,  she also decreased propranolol to once a day by herself instead of 2  times daily. She denies black stools, blood in the stools. She is taking MiraLAX once daily, having one to 2 bowel movements sometimes hard. She denies insomnia, daytime somnolence or lethargy or forgetfulness and confusion. She is undergoing eye surgery on 04/30/2018  Follow-up visit 04/19/2018 She is on furosemide 40 mg and spironolactone 100 mg daily. Her weight has is stable however, she reports she feels less distended and swelling of legs is better. She has no other complaints today.  Follow-up visit 05/24/2018 She underwent EGD since last visit which revealed large esophageal varices and 2 bands were placed. There was some oozing after attempting 3rd banding on one of the varix which stopped after hemospray. She also has antral ulcer, clean-based. She is currently on omeprazole 40 mg twice daily. she's taking furosemide 40 mg, spironolactone 100 mg, propranolol 20 mg twice daily. She is taking oral iron once daily. she is no longer on B12 replacement. She denies black stools, blood in the stools, nausea, vomiting, swelling of legs for abdominal distention  Follow-up visit 08/27/2018 Patient was admitted to [facility] secondary to severe upper GI bleed from ruptured esophageal varices on 08/03/2018.  Upper endoscopy was performed and I attempted to locate the esophageal varices.  This was unsuccessful due to extensive scarring from prior banding.  Therefore, patient was transferred to [facility] for TIPS.  Patient successfully underwent TIPS placement with coil embolization of the left gastric vein on 08/07/2018.  The bleeding was controlled.  She was started on lactulose upon discharge.  Since follow-up, patient reports that she is not seeing black stools or having hematemesis.  She feels lethargic, having trouble to balance when she walks, her speech is slow.  She said she was confused when she was in the hospital after the procedure, therefore she was started on lactulose which she is taking currently.  She is having 1-2 yellow-colored soft bowel movements daily.  She continues to take propranolol, diuretics.  Her hemoglobin after discharge was 12 with normal ferritin levels.  She denies any other GI symptoms. Came by herself today, she has not been driving.  Roommate drove her today to the office visit  She is single, lives with a roommate  NSAIDs: none including BC powder or Goody powder  Antiplts/Anticoagulants/Anti thrombotics: none  GI Procedures:  EGD 08/03/2018 - Normal duodenal bulb and second portion of the duodenum. - Portal hypertensive gastropathy. - Bleeding large (> 5 mm) esophageal varices, ligation was unsuccessful. - No specimens collected.  EGD 04/22/2018 - Normal duodenal bulb and second portion of the duodenum. - Non-bleeding gastric ulcer with a clean ulcer base (Forrest Class III). - Portal hypertensive gastropathy. - Large (> 5 mm) esophageal varices. Incompletely eradicated. Banded x 2. - No specimens collected.  EGD 03/25/2018 - Two non-bleeding angioectasias in the duodenum. Treated with argon plasma coagulation (APC). - A few non-bleeding angioectasias in the stomach. Treated with argon plasma coagulation (APC). - Non-bleeding gastric ulcers with a clean ulcer base (Forrest Class III). - Non-bleeding large (> 5 mm) esophageal varices. Incompletely eradicated. Banded. - No specimens collected.   Colonoscopy 03/25/2018 - One 5 mm polyp in the ascending colon, removed with a cold snare. Resected and retrieved. - Rectal varices. - External hemorrhoids. - The examination was otherwise normal.  DIAGNOSIS:  A. COLON POLYP, ASCENDING; COLD SNARE:  - TUBULAR ADENOMA.  - NEGATIVE FOR HIGH-GRADE DYSPLASIA AND MALIGNANCY.  EGD 02/09/18 The examined duodenum was normal. The stomach was normal. Five columns of non-bleeding grade III varices were found in the lower third of the esophagus,. Stigmata of recent bleeding were evident and red wale signs were  present. Ten bands were successfully placed with incomplete eradication of varices. There was no bleeding during, and at the end, of the procedure.  Past Medical History:  Diagnosis Date  . Anemia    TAKES IRON TAB  . Arthritis    SHOULDER  . Bleeding    GI  3/19  . Bronchitis    HX OF  . Cirrhosis ([place])   . Diabetes mellitus without complication ([place])    TYPE 2  . Fatty (change of) liver, not elsewhere classified   . Full dentures    UPPER AND LOWER  . Lung nodule, multiple     Past Surgical History:  Procedure Laterality Date  . CATARACT EXTRACTION W/PHACO Right 02/06/2018   Procedure: CATARACT EXTRACTION PHACO AND INTRAOCULAR LENS PLACEMENT ([place]) RIGHT DIABETIC;  Surgeon: Leandrew Koyanagi, MD;  Location: [facility];  Service: Ophthalmology;  Laterality: Right;  DIABETIC, ORAL MED  . CATARACT EXTRACTION W/PHACO Left 04/30/2018   Procedure: CATARACT EXTRACTION PHACO AND INTRAOCULAR LENS PLACEMENT (I[facility]);  Surgeon: Leandrew Koyanagi, MD;  Location: [facility];  Service: Ophthalmology;  Laterality: Left;  [place] 01:04 AP% 21.3 CDE 13.66 Fluid pack lot # [id number]  . COLONOSCOPY WITH PROPOFOL N/A 03/25/2018   Procedure: COLONOSCOPY WITH PROPOFOL;  Surgeon: Lin Landsman, MD;  Location: [facility] ENDOSCOPY;  Service: Gastroenterology;  Laterality: N/A;  . DENTAL SURGERY     EXTRACTIONS  . ESOPHAGOGASTRODUODENOSCOPY N/A 08/03/2018   Procedure: ESOPHAGOGASTRODUODENOSCOPY (EGD);  Surgeon: Lin Landsman, MD;  Location: [facility] ENDOSCOPY;  Service: Gastroenterology;  Laterality: N/A;  . ESOPHAGOGASTRODUODENOSCOPY (EGD) WITH PROPOFOL N/A 02/10/2018   Procedure: ESOPHAGOGASTRODUODENOSCOPY (EGD) WITH PROPOFOL;  Surgeon: Jonathon Bellows, MD;  Location: [facility] ENDOSCOPY;  Service: Gastroenterology;  Laterality: N/A;  . ESOPHAGOGASTRODUODENOSCOPY (EGD) WITH PROPOFOL N/A 03/25/2018   Procedure: ESOPHAGOGASTRODUODENOSCOPY (EGD) WITH PROPOFOL;  Surgeon: Lin Landsman, MD;  Location: [facility]  ENDOSCOPY;  Service: Gastroenterology;  Laterality: N/A;  . ESOPHAGOGASTRODUODENOSCOPY (EGD) WITH PROPOFOL N/A 04/22/2018   Procedure: ESOPHAGOGASTRODUODENOSCOPY (EGD) WITH PROPOFOL with band ligation;  Surgeon: Lin Landsman, MD;  Location: [facility];  Service: Gastroenterology;  Laterality: N/A;  . ESOPHAGOGASTRODUODENOSCOPY (EGD) WITH PROPOFOL N/A 06/24/2018   Procedure: ESOPHAGOGASTRODUODENOSCOPY (EGD) WITH PROPOFOL;  Surgeon: Lin Landsman, MD;  Location: [facility] ENDOSCOPY;  Service: Gastroenterology;  Laterality: N/A;  . EYE SURGERY    . HEMORRHOID BANDING  03/25/2018   Procedure: HEMORRHOID BANDING;  Surgeon: Lin Landsman, MD;  Location: [facility] ENDOSCOPY;  Service: Gastroenterology;;  . IR EMBO ART  VEN HEMORR LYMPH EXTRAV  INC GUIDE ROADMAPPING  08/07/2018  . IR TIPS  08/07/2018  . RADIOLOGY WITH ANESTHESIA N/A 08/07/2018   Procedure: TIPS;  Surgeon: Corrie Mckusick, DO;  Location: [facility];  Service: Anesthesiology;  Laterality: N/A;     Current Outpatient Medications:  .  cyanocobalamin (,VITAMIN B-12,) 1000 MCG/ML injection, Inject 1,000 mcg into the muscle once., Disp: , Rfl:  .  furosemide (LASIX) 20 MG tablet, Take 2 tablets (40 mg total) by mouth daily., Disp: 180 tablet, Rfl: 1 .  lactulose (CHRONULAC) 10 GM/15ML solution, Take 30 mLs (20 g total) by mouth 2 (two) times daily., Disp: 1800 mL,  Rfl: 0 .  metFORMIN (GLUCOPHAGE) 500 MG tablet, Take 1,000 mg by mouth 2 (two) times daily with a meal. , Disp: , Rfl:  .  nystatin (MYCOSTATIN/NYSTOP) powder, Apply 1 g topically as needed (rash). , Disp: , Rfl:  .  ondansetron (ZOFRAN) 4 MG tablet, Take 1 tablet (4 mg total) by mouth every 6 (six) hours as needed for nausea., Disp: 20 tablet, Rfl: 0 .  pantoprazole (PROTONIX) 40 MG tablet, Take 1 tablet (40 mg total) by mouth 2 (two) times daily., Disp: 60 tablet, Rfl: 0 .  spironolactone (ALDACTONE) 50 MG tablet, Take 2 tablets (100 mg total) by mouth daily., Disp: 180 tablet,  Rfl: 1 .  docusate sodium (COLACE) 100 MG capsule, Take 100 mg by mouth daily., Disp: , Rfl:  .  propranolol (INDERAL) 20 MG tablet, Take 1 tablet (20 mg total) by mouth 2 (two) times daily., Disp: 180 tablet, Rfl: 1 .  rifaximin (XIFAXAN) 550 MG TABS tablet, Take 1 tablet (550 mg total) by mouth 2 (two) times daily., Disp: 180 tablet, Rfl: 1   Family History  Problem Relation Age of Onset  . CAD Father   . Cancer Maternal Uncle      Social History   Tobacco Use  . Smoking status: Never Smoker  . Smokeless tobacco: Never Used  Substance Use Topics  . Alcohol use: No    Frequency: Never  . Drug use: Never    Allergies as of 08/27/2018  . (No Known Allergies)    Review of Systems:    All systems reviewed and negative except where noted in HPI.   Physical Exam:  BP 136/74 (BP Location: Left Arm, Patient Position: Sitting, Cuff Size: Large)   Pulse 60   Resp 16   Ht 5' 2.9" (1.598 m)   Wt 166 lb (75.3 kg)   BMI 29.50 kg/m  No LMP recorded. Patient is postmenopausal.  General:   Alert,  Well-developed, well-nourished, pleasant and cooperative in NAD, lethargic Head:  Normocephalic and atraumatic. Eyes:  Sclera clear, no icterus.   Conjunctiva pink. Ears:  Normal auditory acuity. Nose:  No deformity, discharge, or lesions. Mouth:  No deformity or lesions,oropharynx pink & moist. Neck:  Supple; no masses or thyromegaly. Lungs:  Respirations even and unlabored.  Clear throughout to auscultation.   No wheezes, crackles, or rhonchi. No acute distress. Heart:  Regular rate and rhythm; no murmurs, clicks, rubs, or gallops. Abdomen:  Normal bowel sounds. Soft, non-tender and nondistended, without masses, hepatosplenomegaly or hernias noted.  No guarding or rebound tenderness.   Rectal: Not performed Msk:  Symmetrical without gross deformities. muscle wasting in bilateral upper extremities Good, equal movement & strength bilaterally. Pulses:  Normal pulses noted. Extremities:   No clubbing, 1+ edema, No cyanosis. Neurologic:  Alert and oriented x3;  grossly normal neurologically. Skin: telangiectasias on upper anterior chest, No jaundice. Lymph Nodes:  No significant cervical adenopathy. Psych:  Alert and cooperative. Normal mood and affect.  Imaging Studies: Reviewed. CT A/P with contrast on 02/09/2018 revealed mildly enlarged retroperitoneal lymph nodes, largest with 11 mm concerning for metastatic disease or malignancy,18 mm spiculated density in the right lower lobe concerning for malignancy associated with small left pleural effusion  Assessment and Plan:   Deitra Craine Ryce is a 67 y.o. Caucasian female with metabolic syndrome, with decompensated cirrhosis secondary to ascites and refractory variceal bleed from esophageal varices status post TIPS and coil embolization of the left gastric vein here for follow-up  Decompensated  cirrhosis: child class B, MELD-Na 9 Secondary liver disease workup positive for HCV antibodies, but viral load not detected, anti-smooth muscle antibodies weakly positive. 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 Ceruloplasmin, antimitochondrial antibodies, hepatitis B, alpha-1 antitrypsin came back negative Portal hypertension: manifested as ascites, esophageal varices, splenomegaly S/p TIPS: Secondary to refractory variceal bleed. She will follow-up with interventional radiology in 2 weeks Ascites/volume overload:currently euvolemic, as patient has TIPS, decrease Lasix to 4m and spironolactone to 579mdaily. Suggested her to maintain a log and check weight periodically, continue low-sodium diet. Avoid processed meats, and foods, chips, carbonated beverages. Provided her with educational material about low-sodium diet during initial visit Varices: variceal bleed from esophageal varices, status post EGD on 02/10/2018, EVLx10, status post EGD 03/25/2018 EVLx4, s/p EGD 04/22/18 EVLx2 and  hemospray, refractory variceal bleed 08/03/2018, status post TIPS with coil embolization of left gastric vein on 08/07/2018.  Recommend to discontinue propranolol  Repeat EGD in 11/2018.    Anemia: multifactorial- combination of peptic ulcer disease,gastric AVMs, portal hypertensive gastropathy, esophageal varices. she has developed severe iron deficiency anemia secondary to variceal hemorrhage, peptic ulcer disease, gastric AVMs and portal hypertensive gastropathy. Hemoglobin  stable , iron studies revealed improved ferritin levels, B12 deficiency has resolved. There is no evidence of coagulopathy or thrombocytopenia HCC screening: AFP normal, no liver lesions on CT in 01/2018. Recheck AFP and ultrasound liver today PSE: Started on lactulose post TIPS.  Patient appears to be foggy, lethargic and difficulty balancing.  We will start rifaximin 550 mg twice daily, long-term.  HRS: none Hepatic hydrothorax: none  Health maintenance:  She is immune to hepatitis B She received Pneumovax and influenza vaccine in 07/2018 vitamin D levels 29.2 on 03/15/2018 Avoid excess Tylenol and NSAIDs use Colon cancer screening:underwent colonoscopy on 04/11/2018, One 5 mm polyp in the ascending colon, removed with a cold snare, Patch showed tubular adenoma with no evidence of high-grade dysplasia or malignancy. Recommend repeat colonoscopy in 5 years Right lower lobe lung lesion and retroperitoneal lymphadenopathy: follow-up with Dr. CoMike Gipno evidence of retroperitoneal lymphadenopathy. Repeat CT chest revealed that the lesions were stable. She was recommended to undergo bronchoscopy and referred to Dr KaMortimer FriesPatient said she will think about it whether to undergo bronchoscopy are not  Follow up in 2 weeks Advised patient to bring her daughter with her for next visit Patient instructions provided Labs today   RoCephas DarbyMD

## 2018-08-27 NOTE — Telephone Encounter (Signed)
Awaiting prior auth

## 2018-08-27 NOTE — Patient Instructions (Addendum)
1. Continue lactulose 20g (62m0 two times daily 2. Start rifaximin 5527mtwo times daily for encephalopathy 3. Stop propranolol 4. Decrease lasix 2087m pill daily and spironolactone 35m70mpill daily only 5. Continue Protonix or omeprazole 1 pill a day 6. Do not recommend self driving 7. Try to bring your daughter for next follow up visit in 2weeks   Please call our office to speak with my nurse [name] business hours from 8am to 4pm if you have any questions/concerns. During after hours, you will be redirected to on call GI physician. For any emergency please call 911 or go the nearest emergency room.    Crystal [name] [id number] [phone]: [phone]: [phone]

## 2018-08-28 ENCOUNTER — Inpatient Hospital Stay: Payer: Medicare HMO

## 2018-08-28 VITALS — BP 115/64 | HR 68 | Temp 96.0°F | Resp 16

## 2018-08-28 DIAGNOSIS — D509 Iron deficiency anemia, unspecified: Secondary | ICD-10-CM | POA: Diagnosis not present

## 2018-08-28 DIAGNOSIS — E119 Type 2 diabetes mellitus without complications: Secondary | ICD-10-CM | POA: Diagnosis not present

## 2018-08-28 DIAGNOSIS — D510 Vitamin B12 deficiency anemia due to intrinsic factor deficiency: Secondary | ICD-10-CM | POA: Diagnosis not present

## 2018-08-28 DIAGNOSIS — E538 Deficiency of other specified B group vitamins: Secondary | ICD-10-CM

## 2018-08-28 DIAGNOSIS — J90 Pleural effusion, not elsewhere classified: Secondary | ICD-10-CM | POA: Diagnosis not present

## 2018-08-28 MED ORDER — CYANOCOBALAMIN 1000 MCG/ML IJ SOLN
1000.0000 ug | Freq: Once | INTRAMUSCULAR | Status: AC
  Administered 2018-08-28: 1000 ug via INTRAMUSCULAR

## 2018-08-28 MED ORDER — CYANOCOBALAMIN 1000 MCG/ML IJ SOLN
INTRAMUSCULAR | Status: AC
  Filled 2018-08-28: qty 1

## 2018-08-29 ENCOUNTER — Telehealth: Payer: Self-pay | Admitting: Gastroenterology

## 2018-08-29 NOTE — Telephone Encounter (Signed)
Pt left vm she still has not received her rx at drug store she states maybe we need to change the brand

## 2018-08-30 NOTE — Telephone Encounter (Signed)
Spoke with patient notified her that we are waiting for prior auth determination, pt verbalized understanding

## 2018-09-03 ENCOUNTER — Encounter: Payer: Self-pay | Admitting: Radiology

## 2018-09-03 ENCOUNTER — Ambulatory Visit
Admission: RE | Admit: 2018-09-03 | Discharge: 2018-09-03 | Disposition: A | Discharge status: Home / Self Care | Payer: Medicare HMO | Source: Ambulatory Visit | Attending: Radiology | Admitting: Radiology

## 2018-09-03 ENCOUNTER — Ambulatory Visit
Admission: RE | Admit: 2018-09-03 | Discharge: 2018-09-03 | Disposition: A | Discharge status: Home / Self Care | Payer: Medicare HMO | Source: Ambulatory Visit | Attending: Interventional Radiology | Admitting: Interventional Radiology

## 2018-09-03 DIAGNOSIS — K7460 Unspecified cirrhosis of liver: Secondary | ICD-10-CM

## 2018-09-03 DIAGNOSIS — I8510 Secondary esophageal varices without bleeding: Secondary | ICD-10-CM

## 2018-09-03 HISTORY — PX: IR RADIOLOGIST EVAL & MGMT: IMG5224

## 2018-09-03 NOTE — Progress Notes (Signed)
Chief Complaint: Portal HTN, SP TIPS  Referring Physician(s): Dr. Marius Ditch, GI Dr. Lorelee Market, PCP, Tyler Deis, [place] Dr. Mike Gip, Oncology  History of Present Illness: Crystal [name] is a 67 y.o. female presenting as a scheduled follow up appointment to [place] clinic today, SP TIPS performed 08/07/2018.    The TIPS was performed at [facility] for an admission for emergent esophageal variceal bleeding.    MELD at the time of the procedure was 10.    She is here today with her daughter for our follow up visit.  She tells me she has recently been to clinic visit with both her GI doctor, Dr. Marius Ditch, as well as her oncologist, Dr. Mike Gip.    She became symptomatic just after her discharge home with episode of hepatic encephalopathy, and her daughter discovered her to be taking only 2 tsp of lactulose, not her prescribed does of 2 tablespoon.  Once this was fixed, she has normalized.  She continues to take 2 tablespoons daily.  She denies any fevers/rigors/chills.  She denies any ongoing or recurrent melena or hematemesis.  She has occasional nausea related to meals, which is resolved by "taking a nap" or taking dose of zofran.    Overall she is feels good, but has some fatigue and feels "run down".  She does live by herself, and tells me she feels fine about her daily activity, and is able to take care of herself just fine.  ECOG of 1 by this history.  Her daughter checks on her frequently.    Past Medical History:  Diagnosis Date  . Acute GI bleeding 02/09/2018  . Acute upper gastrointestinal bleeding 08/03/2018  . Anemia    TAKES IRON TAB  . Arthritis    SHOULDER  . B12 deficiency 02/18/2018  . Bleeding    GI  3/19  . Bronchitis    HX OF  . Cirrhosis ([place])   . Colon cancer screening   . Diabetes mellitus without complication ([place])    TYPE 2  . Fatty (change of) liver, not elsewhere classified   . Full dentures    UPPER AND LOWER  . Iron deficiency anemia due to chronic blood loss  02/18/2018  . Lung nodule, multiple   . Pernicious anemia 02/22/2018  . PUD (peptic ulcer disease)   . Upper GI bleeding 08/03/2018    Past Surgical History:  Procedure Laterality Date  . CATARACT EXTRACTION W/PHACO Right 02/06/2018   Procedure: CATARACT EXTRACTION PHACO AND INTRAOCULAR LENS PLACEMENT ([place]) RIGHT DIABETIC;  Surgeon: Leandrew Koyanagi, MD;  Location: [place];  Service: Ophthalmology;  Laterality: Right;  DIABETIC, ORAL MED  . CATARACT EXTRACTION W/PHACO Left 04/30/2018   Procedure: CATARACT EXTRACTION PHACO AND INTRAOCULAR LENS PLACEMENT ([facility]);  Surgeon: Leandrew Koyanagi, MD;  Location: [facility] ORS;  Service: Ophthalmology;  Laterality: Left;  [place] 01:04 AP% 21.3 CDE 13.66 Fluid pack lot # [id number]  . COLONOSCOPY WITH PROPOFOL N/A 03/25/2018   Procedure: COLONOSCOPY WITH PROPOFOL;  Surgeon: Lin Landsman, MD;  Location: [facility] ENDOSCOPY;  Service: Gastroenterology;  Laterality: N/A;  . DENTAL SURGERY     EXTRACTIONS  . ESOPHAGOGASTRODUODENOSCOPY N/A 08/03/2018   Procedure: ESOPHAGOGASTRODUODENOSCOPY (EGD);  Surgeon: Lin Landsman, MD;  Location[facility] ENDOSCOPY;  Service: Gastroenterology;  Laterality: N/A;  . ESOPHAGOGASTRODUODENOSCOPY (EGD) WITH PROPOFOL N/A 02/10/2018   Procedure: ESOPHAGOGASTRODUODENOSCOPY (EGD) WITH PROPOFOL;  Surgeon: Jonathon Bellows, MD;  Location: [facility] ENDOSCOPY;  Service: Gastroenterology;  Laterality: N/A;  . ESOPHAGOGASTRODUODENOSCOPY (EGD) WITH PROPOFOL N/A 03/25/2018  Procedure: ESOPHAGOGASTRODUODENOSCOPY (EGD) WITH PROPOFOL;  Surgeon: Lin Landsman, MD;  Location: [facility] ENDOSCOPY;  Service: Gastroenterology;  Laterality: N/A;  . ESOPHAGOGASTRODUODENOSCOPY (EGD) WITH PROPOFOL N/A 04/22/2018   Procedure: ESOPHAGOGASTRODUODENOSCOPY (EGD) WITH PROPOFOL with band ligation;  Surgeon: Lin Landsman, MD;  Location: [place];  Service: Gastroenterology;  Laterality: N/A;  . ESOPHAGOGASTRODUODENOSCOPY (EGD) WITH PROPOFOL N/A  06/24/2018   Procedure: ESOPHAGOGASTRODUODENOSCOPY (EGD) WITH PROPOFOL;  Surgeon: Lin Landsman, MD;  Location: [facility] ENDOSCOPY;  Service: Gastroenterology;  Laterality: N/A;  . EYE SURGERY    . HEMORRHOID BANDING  03/25/2018   Procedure: HEMORRHOID BANDING;  Surgeon: Lin Landsman, MD;  Location: [facility] ENDOSCOPY;  Service: Gastroenterology;;  . IR EMBO ART  VEN HEMORR LYMPH EXTRAV  INC GUIDE ROADMAPPING  08/07/2018  . IR TIPS  08/07/2018  . RADIOLOGY WITH ANESTHESIA N/A 08/07/2018   Procedure: TIPS;  Surgeon: Corrie Mckusick, DO;  Location: [place];  Service: Anesthesiology;  Laterality: N/A;    Allergies: Patient has no known allergies.  Medications: Prior to Admission medications   Medication Sig Start Date End Date Taking? Authorizing Provider  cyanocobalamin (,VITAMIN B-12,) 1000 MCG/ML injection Inject 1,000 mcg into the muscle once.   Yes [provider]  lactulose (CHRONULAC) 10 GM/15ML solution Take 30 mLs (20 g total) by mouth 2 (two) times daily. 08/10/18 09/09/18 Yes Arrien, Jimmy Picket, MD  metFORMIN (GLUCOPHAGE) 500 MG tablet Take 1,000 mg by mouth 2 (two) times daily with a meal.    Yes [provider]  ondansetron (ZOFRAN) 4 MG tablet Take 1 tablet (4 mg total) by mouth every 6 (six) hours as needed for nausea. 08/03/18  Yes Rosine Door, MD  pantoprazole (PROTONIX) 40 MG tablet Take 1 tablet (40 mg total) by mouth 2 (two) times daily. 08/10/18 09/09/18 Yes Arrien, Jimmy Picket, MD  spironolactone (ALDACTONE) 50 MG tablet Take 2 tablets (100 mg total) by mouth daily. 05/24/18 11/20/18 Yes Vanga, Tally Due, MD  nystatin (MYCOSTATIN/NYSTOP) powder Apply 1 g topically as needed (rash).  05/23/18   [provider]  propranolol (INDERAL) 20 MG tablet Take 1 tablet (20 mg total) by mouth 2 (two) times daily. 02/14/18 08/13/18  Lin Landsman, MD  rifaximin (XIFAXAN) 550 MG TABS tablet Take 1 tablet (550 mg total) by mouth 2 (two) times  daily. Patient not taking: Reported on 09/03/2018 08/27/18 02/23/19  Lin Landsman, MD     Family History  Problem Relation Age of Onset  . CAD Father   . Cancer Maternal Uncle     Social History   Socioeconomic History  . Marital status: Single    Spouse name: Not on file  . Number of children: Not on file  . Years of education: Not on file  . Highest education level: Not on file  Occupational History  . Not on file  Social Needs  . Financial resource strain: Not on file  . Food insecurity:    Worry: Not on file    Inability: Not on file  . Transportation needs:    Medical: Not on file    Non-medical: Not on file  Tobacco Use  . Smoking status: Never Smoker  . Smokeless tobacco: Never Used  Substance and Sexual Activity  . Alcohol use: No    Frequency: Never  . Drug use: Never  . Sexual activity: Not on file  Lifestyle  . Physical activity:    Days per week: Not on file    Minutes per session: Not  on file  . Stress: Not on file  Relationships  . Social connections:    Talks on phone: Not on file    Gets together: Not on file    Attends religious service: Not on file    Active member of club or organization: Not on file    Attends meetings of clubs or organizations: Not on file    Relationship status: Not on file  Other Topics Concern  . Not on file  Social History Narrative   Independent at baseline. Lives at home with family    ECOG Status: 1 - Symptomatic but completely ambulatory  Review of Systems: A 12 point ROS discussed and pertinent positives are indicated in the HPI above.  All other systems are negative.  Review of Systems  Vital Signs: BP 131/62   Pulse 88   Temp 97.7 F (36.5 C) (Oral)   Resp 15   Ht 5' 3" (1.6 m)   Wt 80.3 kg   SpO2 97%   BMI 31.35 kg/m   Physical Exam  Mallampati Score:     Imaging: Dg Chest 1 View  Result Date: 08/07/2018 CLINICAL DATA:  Bedside central venous catheter placement. EXAM: Portable  CHEST 1 VIEW COMPARISON:  08/04/2018, 01/07/2015 and CT chest 05/15/2018. FINDINGS: LEFT jugular central venous catheter tip projects over the expected location of the UPPER SVC with its tip directed laterally. No evidence of pneumothorax or mediastinal hematoma. Suboptimal inspiration accounts for mild bibasilar atelectasis. Cardiac silhouette mildly enlarged, unchanged. Pulmonary venous hypertension and perhaps minimal interstitial pulmonary edema as there are scattered Kerley B lines. IMPRESSION: 1. LEFT jugular central venous catheter tip projects over the UPPER SVC with its tip directed laterally. No acute complicating features. 2. Possible minimal CHF and/or fluid overload. Electronically Signed   By: Evangeline Dakin M.D.   On: 08/07/2018 19:53   Ct Chest Wo Contrast  Result Date: 08/15/2018 CLINICAL DATA:  Follow-up pulmonary nodules EXAM: CT CHEST WITHOUT CONTRAST TECHNIQUE: Multidetector CT imaging of the chest was performed following the standard protocol without IV contrast. COMPARISON:  05/15/2018 FINDINGS: Cardiovascular: The heart size appears within normal limits. No pericardial effusion. Aortic atherosclerosis. Calcification in the LAD, left circumflex and RCA coronary artery noted. Mediastinum/Nodes: Normal appearance of the thyroid gland. The trachea appears patent and is midline. Normal appearance of the esophagus. No axillary or mediastinal adenopathy. Lungs/Pleura: No pleural effusion, airspace consolidation or atelectasis identified. Right lower lobe measures 1.8 by 1.6 cm, image 85/3. Previously 1.8 x 1.4 cm. The right middle lobe lung nodule measures 2.0 by 1.5 cm, image 84/3. Previously 2.4 x 1.4 cm. Upper Abdomen: Cirrhosis of the liver is identified. TIPS shunt is again noted. Splenomegaly identified. No significant ascites within the upper abdomen. Musculoskeletal: Spondylosis identified within the thoracic spine. IMPRESSION: 1. The spiculated right lower lobe lung lesion appears  slightly increased in is not significantly changed in size compared with previous exam. Morphologically, this is worrisome for a small bronchogenic neoplasm. 2. The right middle lobe lung nodule is slightly decreased in size in the interval and may be post infectious or inflammatory in etiology. This will require continued interval follow-up for minimum of 24 months to ensure stability and/or resolution. 3.  Aortic Atherosclerosis (ICD10-I70.0). 4. Three vessel coronary artery atherosclerotic calcifications. 5. Electronically Signed   By: Kerby Moors M.D.   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 Patient is  being evaluated for TIPS procedure. EXAM: CT ABDOMEN AND PELVIS WITH CONTRAST TECHNIQUE: Multidetector CT imaging of the abdomen and pelvis was performed using the standard protocol following bolus administration of intravenous contrast. CONTRAST:  117m OMNIPAQUE IOHEXOL 300 MG/ML  SOLN COMPARISON:  Chest CT 05/15/2018 and abdominal CT 02/09/2018 FINDINGS: Lower chest: Again noted is a lesion in the right lower lobe measuring roughly 1.7 cm and minimally changed from the most recent CT. No large pleural effusions. Hepatobiliary: Perihepatic ascites. Liver appears to be mildly nodular and similar to the prior examination. Mild distention of the gallbladder with a calcified stone. Punctate low-density structure at the hepatic dome is unchanged and suggestive for a small cyst. No suspicious hepatic lesions. Main portal vein, right portal vein and left portal vein are patent. No biliary dilatation. Pancreas: Unremarkable. No pancreatic ductal dilatation or surrounding inflammatory changes. Spleen: Stable enlargement of the spleen. Small amount of ascites in left upper abdomen. Adrenals/Urinary Tract: Normal adrenal glands. Normal appearance of both kidneys without hydronephrosis. No suspicious renal  lesions. Gas in the urinary bladder may be iatrogenic. No evidence for bladder wall thickening. Stomach/Bowel: No gross abnormality to the stomach, small bowel or colon. No acute bowel inflammatory changes. Vascular/Lymphatic: Atherosclerotic calcifications at the right renal artery origin. At least mild narrowing at the origin of the right renal artery. Main visceral arteries are patent. Negative for an aortic aneurysm. Iliac veins are patent. IVC is patent. Small varices involving the distal esophagus and GE junction associated with the left gastric or coronary vein. Few small gastric varices which are probably involving the short gastric veins. Enlarged lymph nodes throughout the gastrohepatic ligament and upper abdomen are similar to the prior abdominal CT and likely secondary to cirrhosis. Index lymph node near the porta hepatis measures 2.1 cm on sequence 6 image 22 and stable. Again noted is lymph node enlargement in the central abdominal mesentery. Reproductive: Evidence for tubal ligation. Again noted is a large low-density structure involving the left ovary/adnexa that measures roughly 7 cm and similar to the CT from 02/09/2018. this was a simple cyst on ultrasound from 03/18/2018. Other: Small amount of upper abdominal ascites. No significant pelvic ascites. Diffuse subcutaneous edema. Negative for free air. Musculoskeletal: Anterolisthesis at L4-L5 is likely secondary to facet arthropathy. IMPRESSION: 1. Small varices in the distal esophagus and GE junction associated with the left gastric vein. Portal venous system is patent. Cirrhotic changes with portal hypertension demonstrated by esophageal varices, ascites and splenomegaly. 2. Stable abdominal lymphadenopathy which is likely secondary to cirrhosis. 3. No significant change in the right lower lobe pulmonary nodule. 4. Gas within the urinary bladder is nonspecific and could be iatrogenic. 5. No change in the large left adnexal/ovarian cyst. 6.  Cholelithiasis. Electronically Signed   By: AMarkus DaftM.D.   On: 08/05/2018 17:09   Ir Tips  Result Date: 08/08/2018 CLINICAL DATA:  67year old female with cirrhosis and portal hypertension with recurrent bleeding esophageal varices INDICATION: Recurrent bleeding esophageal varices EXAM: ULTRASOUND GUIDED ACCESS RIGHT JUGULAR VEIN FOR TRANSJUGULAR INTRAHEPATIC PORTOSYSTEMIC SHUNT PLACEMENT ULTRASOUND-GUIDED PERCUTANEOUS NEEDLE PLACEMENT, TRANSHEPATIC PORTAL VENOUS ACCESS COIL EMBOLIZATION OF LEFT GASTRIC VEIN AS MAIN CONTRIBUTION TO ESOPHAGEAL VARICEAL SYSTEM MEDICATIONS: As antibiotic prophylaxis, 2 g Ancef was ordered pre-procedure and administered intravenously within one hour of incision. One unit of platelets was also administered IV during the procedure. ANESTHESIA/SEDATION: General - as administered by the Anesthesia department CONTRAST:  80 cc Omnipaque FLUOROSCOPY TIME:  Fluoroscopy Time: 42 minutes 18 seconds (761 mGy).  COMPLICATIONS: None PROCEDURE: Informed written consent was obtained from the patient and the patient's family after a thorough discussion of the procedural risks, benefits and alternatives. Specific risks discussed with TIPS/variceal embolization included: Bleeding, infection, vascular injury, need for further procedure/surgery, renal injury/renal failure, contrast reaction, non-target embolization, liver dysfunction/failure, hepatic encephalopathy, stroke (~1%), cardiopulmonary collapse, death. All questions were addressed. Maximal Sterile Barrier Technique was utilized including caps, mask, sterile gowns, sterile gloves, sterile drape, hand hygiene and skin antiseptic. A timeout was performed prior to the initiation of the procedure. Patient was positioned supine position on the table, with the right upper quadrant and the right neck prepped and draped in the usual sterile fashion. General anesthesia was initiated with anesthesia team. Ultrasound survey was then performed of the  right neck, with images stored and sent to PACs. Ultrasound guidance was then used to access the right internal jugular vein with a micro puncture kit. The wire was advanced under fluoroscopy into the right atrium, and a small incision was made. The needle was removed and the dilator was placed. The micro wire in the stiffener were removed and an 035 wire was then passed into the inferior vena cava. Twelve French soft tissue dilation was performed on the wire, and then 10 [place] TIPS sheath was placed. Combination of Benson wire and multipurpose angled catheter were used to select the right hepatic vein. During the selection the right hepatic vein, accessory hepatic vein was accessed with venography. Venogram confirmed location within the right hepatic vein. Tip sheath was then advanced over the catheter with a coaxial Amplatz wire, for a position cephalad to the transition point of the wire in the portal venous system. The Colapinto needle was then advanced through the TIPS sheath housed in the Teflon sheath over the Amplatz wire. The needle was then advanced into the patent parenchyma, and CO2 portography was performed with AP and right anterior oblique positions. The CO2 portography was used to estimate location of portal vein. After 3 attempts at passing the goal of into the needle into the portal vein, the needle was withdrawn from the wire with the wire remaining in place. Ultrasound survey of the right upper quadrant was performed, with images stored and sent to PACs. Using ultrasound guidance, Chiba needle was used access the right portal system. Once we confirmed position in the portal system with portal venography, micro wire was advanced into the inferior mesenteric vein. The inner dilator was advanced over the metal stiffener into the portal system, with the inner dilator advanced over the wire. Venogram/portography confirmed are location within the portal system. The micro wire was then replaced into  the inner dilator, with the transition point position at the apex of the catheter. We then replaced the TIPS sheath into the right hepatic vein with the use of cobra catheter and a Bentson wire. Bentson wire was exchanged for Amplatz wire and the Teflon housing and the colapinto needle were again advanced into the right hepatic vein. Approximately 3 more attempts were unsuccessful. Needle was removed and accentuated needle curve was placed at the tip of the needle. Approximately 3 more attempts were made to gain access into the portal vein. Britta Mccreedy wire was then passed through the Colapinto needle into the right portal vein. The Cobra catheter was then advanced through the hepatic tissue into the portal vein with the angle used to direct the Bentson wire into the main portal vein. Once the wire was in the main portal vein, the Cobra catheter was removed  over an Amplatz wire. Soft tissue tract dilation was then performed using a 6 mm x 4 cm standard balloon angioplasty. Upon deflation of the final balloon angioplasty the tip sheath was placed over the balloon into the portal vein. Pigtail marking catheter was then advanced over the wire for portal venogram. Estimated length of the tissue tract was then performed with a marking pigtail catheter and we selected a 10 mm diameter by 80 mm length via tore stent graft. A baseline portal pressure was also measured through the pigtail catheter, estimated 25 mm mercury. Pigtail catheter was removed over an Amplatz wire. The selected stent graft was then placed through the tissue tract and sheath, into the portal vein. Partial deployment of the un covered stent was performed with withdrawal to the tissue tract. Sheath was then withdrawn. After deployment of the stent graft, 9 mm balloon angioplasty was performed along the length of the stent graft. Repeat portal pressures performed with a gradient of 14 mm-15 mm mercury. 10 mm balloon angioplasty was then performed along the  length of the stent graft. Repeat pressures were performed estimating a final pressure of 13 mm mercury. Pigtail catheter was removed on a Bentson wire. Using a combination of Bentson wire, cobra catheter, and a Glidewire, the left gastric vein was accessed with angiogram performed. Coil embolization was then performed of the 2 proximal venous channels with 035 coils to stasis. No reflux after placement of the coils. Catheters and wires were removed. Hemostasis was achieved at the right internal jugular vein with manual pressure. Hemostasis was achieved at the right upper abdomen. Patient remained hemodynamically stable throughout the case. No complications with minimal blood loss. 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 Signed, Dulcy Fanny. Dellia Nims, RPVI [facility] Electronically Signed   By: Corrie Mckusick D.O.   On: 08/08/2018 07:05   Ir Embo Art  Lawson Fiscal Hemorr Lymph [employer] Guide Roadmapping  Result Date: 08/08/2018 CLINICAL DATA:  67 year old female with cirrhosis and portal hypertension with recurrent bleeding esophageal varices INDICATION: Recurrent bleeding esophageal varices EXAM: ULTRASOUND GUIDED ACCESS RIGHT JUGULAR VEIN FOR TRANSJUGULAR INTRAHEPATIC PORTOSYSTEMIC SHUNT PLACEMENT ULTRASOUND-GUIDED PERCUTANEOUS NEEDLE PLACEMENT, TRANSHEPATIC PORTAL VENOUS ACCESS COIL EMBOLIZATION OF LEFT GASTRIC VEIN AS MAIN CONTRIBUTION TO ESOPHAGEAL VARICEAL SYSTEM MEDICATIONS: As antibiotic prophylaxis, 2 g Ancef was ordered pre-procedure and administered intravenously within one hour of incision. One unit of platelets was also administered IV during the procedure. ANESTHESIA/SEDATION: General - as administered by the Anesthesia department CONTRAST:  80 cc Omnipaque  FLUOROSCOPY TIME:  Fluoroscopy Time: 42 minutes 18 seconds (761 mGy). COMPLICATIONS: None PROCEDURE: Informed written consent was obtained from the patient and the patient's family after a thorough discussion of the procedural risks, benefits and alternatives. Specific risks discussed with TIPS/variceal embolization included: Bleeding, infection, vascular injury, need for further procedure/surgery, renal injury/renal failure, contrast reaction, non-target embolization, liver dysfunction/failure, hepatic encephalopathy, stroke (~1%), cardiopulmonary collapse, death. All questions were addressed. Maximal Sterile Barrier Technique was utilized including caps, mask, sterile gowns, sterile gloves, sterile drape, hand hygiene and skin antiseptic. A timeout was performed prior to the initiation of the procedure. Patient was positioned supine position on the table, with the right upper quadrant and the right neck prepped and draped in the usual sterile fashion. General anesthesia was initiated with anesthesia team. Ultrasound survey was then  performed of the right neck, with images stored and sent to PACs. Ultrasound guidance was then used to access the right internal jugular vein with a micro puncture kit. The wire was advanced under fluoroscopy into the right atrium, and a small incision was made. The needle was removed and the dilator was placed. The micro wire in the stiffener were removed and an 035 wire was then passed into the inferior vena cava. Twelve French soft tissue dilation was performed on the wire, and then 10 [place] TIPS sheath was placed. Combination of Benson wire and multipurpose angled catheter were used to select the right hepatic vein. During the selection the right hepatic vein, accessory hepatic vein was accessed with venography. Venogram confirmed location within the right hepatic vein. Tip sheath was then advanced over the catheter with a coaxial Amplatz wire, for a position cephalad to the  transition point of the wire in the portal venous system. The Colapinto needle was then advanced through the TIPS sheath housed in the Teflon sheath over the Amplatz wire. The needle was then advanced into the patent parenchyma, and CO2 portography was performed with AP and right anterior oblique positions. The CO2 portography was used to estimate location of portal vein. After 3 attempts at passing the goal of into the needle into the portal vein, the needle was withdrawn from the wire with the wire remaining in place. Ultrasound survey of the right upper quadrant was performed, with images stored and sent to PACs. Using ultrasound guidance, Chiba needle was used access the right portal system. Once we confirmed position in the portal system with portal venography, micro wire was advanced into the inferior mesenteric vein. The inner dilator was advanced over the metal stiffener into the portal system, with the inner dilator advanced over the wire. Venogram/portography confirmed are location within the portal system. The micro wire was then replaced into the inner dilator, with the transition point position at the apex of the catheter. We then replaced the TIPS sheath into the right hepatic vein with the use of cobra catheter and a Bentson wire. Bentson wire was exchanged for Amplatz wire and the Teflon housing and the colapinto needle were again advanced into the right hepatic vein. Approximately 3 more attempts were unsuccessful. Needle was removed and accentuated needle curve was placed at the tip of the needle. Approximately 3 more attempts were made to gain access into the portal vein. Britta Mccreedy wire was then passed through the Colapinto needle into the right portal vein. The Cobra catheter was then advanced through the hepatic tissue into the portal vein with the angle used to direct the Bentson wire into the main portal vein. Once the wire was in the main portal vein, the Cobra catheter was removed over an  Amplatz wire. Soft tissue tract dilation was then performed using a 6 mm x 4 cm standard balloon angioplasty. Upon deflation of the final balloon angioplasty the tip sheath was placed over the balloon into the portal vein. Pigtail marking catheter was then advanced over the wire for portal venogram. Estimated length of the tissue tract was then performed with a marking pigtail catheter and we selected a 10 mm diameter by 80 mm length via tore stent graft. A baseline portal pressure was also measured through the pigtail catheter, estimated 25 mm mercury. Pigtail catheter was removed over an Amplatz wire. The selected stent graft was then placed through the tissue tract and sheath, into the portal vein. Partial deployment of the un covered stent  was performed with withdrawal to the tissue tract. Sheath was then withdrawn. After deployment of the stent graft, 9 mm balloon angioplasty was performed along the length of the stent graft. Repeat portal pressures performed with a gradient of 14 mm-15 mm mercury. 10 mm balloon angioplasty was then performed along the length of the stent graft. Repeat pressures were performed estimating a final pressure of 13 mm mercury. Pigtail catheter was removed on a Bentson wire. Using a combination of Bentson wire, cobra catheter, and a Glidewire, the left gastric vein was accessed with angiogram performed. Coil embolization was then performed of the 2 proximal venous channels with 035 coils to stasis. No reflux after placement of the coils. Catheters and wires were removed. Hemostasis was achieved at the right internal jugular vein with manual pressure. Hemostasis was achieved at the right upper abdomen. Patient remained hemodynamically stable throughout the case. No complications with minimal blood loss. IMPRESSION: Status post transjugular intrahepatic portal systemic shunt placement via right IJ approach, with coil embolization of the left gastric vein channel as the main contributor  to the symptomatic esophageal varices. Signed, Dulcy Fanny. Dellia Nims, RPVI [facility] Electronically Signed   By: Corrie Mckusick D.O.   On: 08/08/2018 07:05    Labs:  CBC: Recent Labs    08/08/18 0337 08/09/18 0500 08/10/18 0339 08/21/18 1419  WBC 5.2 5.0 5.1 4.6  HGB 8.4* 7.7* 8.8* 12.2  HCT 27.9* 26.1* 28.7* 40.5  PLT 138* 111* 116* 153    COAGS: Recent Labs    03/15/18 1420 08/02/18 2212 08/03/18 0548 08/05/18 0855  INR 1.09 1.09 1.16 1.14  APTT  --   --  32  --     BMP: Recent Labs    08/06/18 0601 08/07/18 0442 08/09/18 0410 08/10/18 0339  NA 142 141 140 140  K 3.5 3.8 3.3* 3.8  CL 113* 114* 112* 111  CO2 21* 17* 24 23  GLUCOSE 135* 120* 146* 114*  BUN _0 6*  CALCIUM 8.5* 8.3* 8.0* 8.3*  CREATININE 0.78 0.80 0.77 0.65  GFRNONAA >60 >60 >60 >60  GFRAA >60 >60 >60 >60    LIVER FUNCTION TESTS: Recent Labs    03/15/18 1420 05/24/18 1058 08/02/18 2212 08/05/18 0445  BILITOT 0.8 0.7 0.7 0.9  AST 36 68* 105* 66*  ALT 20 50* 79* 60*  ALKPHOS 241* 252* 216* 133*  PROT 8.5* 7.8 7.1 5.5*  ALBUMIN 3.6 3.5 3.0* 2.5*    TUMOR MARKERS: No results for input(s): AFPTM, CEA, CA199, CHROMGRNA in the last 8760 hours.  Assessment and Plan:  Ms Gumbs is a 67 yo female SP TIPS on 08/07/2018 for recurrent emergent esophageal variceal hemorrhage.   After TIPS, she has done well, with one episode of encephalopathy that is attributed to the patient taking too little of her prescribed lactulose.  Once this was remedied, she has done better.    TIPS duplex study today shows a patent stent with no problems.    I did explain at this point, her care will mostly be surveillance, with a planned repeat TIPS study in 6 months.  She has an upcoming appointment with Dr. Marius Ditch the first week of November.    She knows that she can call us at any point if she has any problems.    Plan: - Our plan will be to see her  back in 6 months with repeat TIPS duplex study.  - I have advised her  to observe her other doctors appointments, and to take her prescribed medications on schedule.   Electronically Signed: Corrie Mckusick 09/03/2018, 10:46 AM   I spent a total of    25 Minutes in face to face in clinical consultation, greater than 50% of which was counseling/coordinating care for SP TIPS for emergent esophageal variceal bleeding.

## 2018-09-04 DIAGNOSIS — E119 Type 2 diabetes mellitus without complications: Secondary | ICD-10-CM | POA: Diagnosis not present

## 2018-09-04 DIAGNOSIS — B1920 Unspecified viral hepatitis C without hepatic coma: Secondary | ICD-10-CM | POA: Diagnosis not present

## 2018-09-04 DIAGNOSIS — E669 Obesity, unspecified: Secondary | ICD-10-CM | POA: Diagnosis not present

## 2018-09-04 DIAGNOSIS — R161 Splenomegaly, not elsewhere classified: Secondary | ICD-10-CM | POA: Diagnosis not present

## 2018-09-09 ENCOUNTER — Telehealth: Payer: Self-pay | Admitting: Gastroenterology

## 2018-09-09 NOTE — Telephone Encounter (Signed)
Pt is calling she only has 2 days left for rx Lactolose 10gm 15 solution please refill

## 2018-09-10 NOTE — Telephone Encounter (Signed)
Spoke with pt today and she is a little confused concerning her medications, she has scheduled an appt on 09/11/18 for medication management

## 2018-09-10 NOTE — Telephone Encounter (Signed)
Pt left vm she went to pharmacy and requested rx lactolose and they gave her rx xifaxan she is confused about what she is supposed to take and what not to take

## 2018-09-11 ENCOUNTER — Encounter: Payer: Self-pay | Admitting: Gastroenterology

## 2018-09-11 ENCOUNTER — Ambulatory Visit (INDEPENDENT_AMBULATORY_CARE_PROVIDER_SITE_OTHER): Payer: Medicare HMO | Admitting: Gastroenterology

## 2018-09-11 ENCOUNTER — Other Ambulatory Visit: Payer: Self-pay

## 2018-09-11 VITALS — BP 124/77 | HR 65 | Resp 16 | Ht 62.9 in | Wt 177.0 lb

## 2018-09-11 DIAGNOSIS — Z95828 Presence of other vascular implants and grafts: Secondary | ICD-10-CM | POA: Diagnosis not present

## 2018-09-11 DIAGNOSIS — K7290 Hepatic failure, unspecified without coma: Secondary | ICD-10-CM

## 2018-09-11 DIAGNOSIS — K7460 Unspecified cirrhosis of liver: Secondary | ICD-10-CM

## 2018-09-11 MED ORDER — LACTULOSE 10 GM/15ML PO SOLN: 20.0000 g | Freq: Two times a day (BID) | ORAL | 0 refills | Status: DC

## 2018-09-11 NOTE — Progress Notes (Signed)
[name], MD [address]  [place]  [place], [place] [address]  Main: [phone]  Fax: [phone]    Gastroenterology Consultation  Referring Provider:     Lorelee Market, MD Primary Care Physician:  Lorelee Market, MD Primary Gastroenterologist:  Dr. [name] Reason for Consultation:     Decompensated cirrhosis        HPI:   Darline Faith Reitter is a 67 y.o. female referred by Dr. Lorelee Market, MD  for consultation & management of decompensated cirrhosis. Patient was recently diagnosed with decompensated cirrhosis, when she presented to [facility] on 02/09/2018 with large volume hematemesis secondary to variceal hemorrhage, underwent upper endoscopy on 02/10/2018 by Dr. Vicente Males, found to have large esophageal varices with red wale signs and underwent variceal ligation x 10. Her bleeding resolved by the time of discharge. Patient was found to have hepatitis C antibodies and HCV virus was not detected. She had other secondary liver disease workup which revealed weakly positive anti-smooth muscle antibodies. She has long-standing history of diabetes, on metformin. She has severe iron deficiency and B12 deficiency, discharged on oral iron and oral B12 supplementation. She was supposed to start nadolol but she did not get prescription filled due to high cost. Patient was just discharged from the hospital 2 days ago  Interval summary: since discharge, patient has been doing fairly well. She denies hematemesis, melena, rectal bleeding. She reports worsening of abdominal distention and swelling of legs which started prior to hospitalization. She is not on diuretics. She is taking iron and B12 supplements, omeprazole.she reports her stools are dark but formed. She wants to know if she can restart drinking Coke. She is not on a low-sodium diet. She denies history of heavy smoking or heavy alcohol use in the past. She denies easy bruising, altered  sleep cycle   Follow-up visit 02/22/2018 Patient is seen by Dr. Mike Gip on it [place] for lung nodule as well as retroperitoneal lymphadenopathy. Workup is in progress. Patient is here for follow-up with regards to her decompensated cirrhosis. She is adhering to medications that I recommended during last visit. She is taking Lasix 20 mg, spironolactone 50 mg, propranolol 20 mg twice a day.She is adhering to a low-salt diet, denies swelling of legs. She has developed cellulitis in her left lower leg and is being started on antibiotic. She says her abdominal distention is improving. She denies melena, hematochezia, hematemesis. She is taking oral iron 3 pills daily and vitamin B12 daily by mouth. She reports that she has been constipated and taking Colace. She otherwise denies any complaints today. She said she had given blood samples for the labs that I ordered last week but I do not see any results. We're calling the lab to find out the status  Follow-up visit 03/15/2018 Patient is seen by Dr. Mike Gip yesterday, undergoing evaluation for lung lesion, plan is to repeat CT scan in 93month There is no concern for retroperitoneal lymphadenopathy. She is scheduled for pelvic ultrasound. She has lost about 27 pounds in last 3 weeks, mostly from aggressive diuresis as she was severely volume overloaded when he saw her in clinic on 02/22/2018. At that time, I increased her diuretics dose. She reports feeling fatigued. Reports that her stools are blackish brown as she is taking oral iron 3 pills daily along with MiraLAX. She denies hematemesis, rectal bleeding. She is adherent to low-salt diet  Follow-up visit 04/12/2018 Patient underwent upper endoscopy with variceal ligation without any  complications. She regained 12 pounds back from worsening ascites and swelling of legs. She is currently on Lasix 20 mg and spironolactone 50 mg daily,  she also decreased propranolol to once a day by herself instead of 2  times daily. She denies black stools, blood in the stools. She is taking MiraLAX once daily, having one to 2 bowel movements sometimes hard. She denies insomnia, daytime somnolence or lethargy or forgetfulness and confusion. She is undergoing eye surgery on 04/30/2018  Follow-up visit 04/19/2018 She is on furosemide 40 mg and spironolactone 100 mg daily. Her weight has is stable however, she reports she feels less distended and swelling of legs is better. She has no other complaints today.  Follow-up visit 05/24/2018 She underwent EGD since last visit which revealed large esophageal varices and 2 bands were placed. There was some oozing after attempting 3rd banding on one of the varix which stopped after hemospray. She also has antral ulcer, clean-based. She is currently on omeprazole 40 mg twice daily. she's taking furosemide 40 mg, spironolactone 100 mg, propranolol 20 mg twice daily. She is taking oral iron once daily. she is no longer on B12 replacement. She denies black stools, blood in the stools, nausea, vomiting, swelling of legs for abdominal distention  Follow-up visit 08/27/2018 Patient was admitted to [facility] secondary to severe upper GI bleed from ruptured esophageal varices on 08/03/2018.  Upper endoscopy was performed and I attempted to locate the esophageal varices.  This was unsuccessful due to extensive scarring from prior banding.  Therefore, patient was transferred to [facility] for TIPS.  Patient successfully underwent TIPS placement with coil embolization of the left gastric vein on 08/07/2018.  The bleeding was controlled.  She was started on lactulose upon discharge.  Since follow-up, patient reports that she is not seeing black stools or having hematemesis.  She feels lethargic, having trouble to balance when she walks, her speech is slow.  She said she was confused when she was in the hospital after the procedure, therefore she was started on lactulose which she is taking currently.  She is having 1-2 yellow-colored soft bowel movements daily.  She continues to take propranolol, diuretics.  Her hemoglobin after discharge was 12 with normal ferritin levels.  She denies any other GI symptoms. Came by herself today, she has not been driving.  Roommate drove her today to the office visit  Follow-up visit 09/11/2018 She followed up with Dr. [name], IR.  Underwent Dopplers and TIPS is patent. She denies swelling of legs, abdominal distention, dark stools, hematemesis.  She reports being alert and able to manage her ADLs.  She is taking lactulose 30 mL twice daily.  She is able to finish it today and asking for refill.  She has not started rifaximin yet.  She stopped propranolol and Lasix.  She continues to take spironolactone 100 mg daily, Protonix twice daily.  She is single, lives with a roommate  NSAIDs: none including BC powder or Goody powder  Antiplts/Anticoagulants/Anti thrombotics: none  GI Procedures:  EGD 08/03/2018 - Normal duodenal bulb and second portion of the duodenum. - Portal hypertensive gastropathy. - Bleeding large (> 5 mm) esophageal varices, ligation was unsuccessful. - No specimens collected.  EGD 04/22/2018 - Normal duodenal bulb and second portion of the duodenum. - Non-bleeding gastric ulcer with a clean ulcer base (Forrest Class III). - Portal hypertensive gastropathy. - Large (> 5 mm) esophageal varices. Incompletely eradicated. Banded x 2. - No specimens collected.  EGD 03/25/2018 - Two  non-bleeding angioectasias in the duodenum. Treated with argon plasma coagulation (APC). - A few non-bleeding angioectasias in the stomach. Treated with argon plasma coagulation (APC). - Non-bleeding gastric ulcers with a clean ulcer base (Forrest Class III). - Non-bleeding large (> 5 mm) esophageal varices. Incompletely eradicated. Banded. - No specimens collected.   Colonoscopy 03/25/2018 - One 5 mm polyp in the ascending colon, removed with a cold  snare. Resected and retrieved. - Rectal varices. - External hemorrhoids. - The examination was otherwise normal.  DIAGNOSIS:  A. COLON POLYP, ASCENDING; COLD SNARE:  - TUBULAR ADENOMA.  - NEGATIVE FOR HIGH-GRADE DYSPLASIA AND MALIGNANCY.  EGD 02/09/18 The examined duodenum was normal. The stomach was normal. Five columns of non-bleeding grade III varices were found in the lower third of the esophagus,. Stigmata of recent bleeding were evident and red wale signs were present. Ten bands were successfully placed with incomplete eradication of varices. There was no bleeding during, and at the end, of the procedure.  Past Medical History:  Diagnosis Date  . Acute GI bleeding 02/09/2018  . Acute upper gastrointestinal bleeding 08/03/2018  . Anemia    TAKES IRON TAB  . Arthritis    SHOULDER  . B12 deficiency 02/18/2018  . Bleeding    GI  3/19  . Bronchitis    HX OF  . Cirrhosis ([place])   . Colon cancer screening   . Diabetes mellitus without complication ([place])    TYPE 2  . Fatty (change of) liver, not elsewhere classified   . Full dentures    UPPER AND LOWER  . Iron deficiency anemia due to chronic blood loss 02/18/2018  . Lung nodule, multiple   . Pernicious anemia 02/22/2018  . PUD (peptic ulcer disease)   . Upper GI bleeding 08/03/2018    Past Surgical History:  Procedure Laterality Date  . CATARACT EXTRACTION W/PHACO Right 02/06/2018   Procedure: CATARACT EXTRACTION PHACO AND INTRAOCULAR LENS PLACEMENT ([place]) RIGHT DIABETIC;  Surgeon: Leandrew Koyanagi, MD;  Location: [place];  Service: Ophthalmology;  Laterality: Right;  DIABETIC, ORAL MED  . CATARACT EXTRACTION W/PHACO Left 04/30/2018   Procedure: CATARACT EXTRACTION PHACO AND INTRAOCULAR LENS PLACEMENT (I[facility]);  Surgeon: Leandrew Koyanagi, MD;  Location: [facility];  Service: Ophthalmology;  Laterality: Left;  [place] 01:04 AP% 21.3 CDE 13.66 Fluid pack lot # [id number]  . COLONOSCOPY WITH PROPOFOL N/A 03/25/2018    Procedure: COLONOSCOPY WITH PROPOFOL;  Surgeon: Lin Landsman, MD;  Location: [facility] ENDOSCOPY;  Service: Gastroenterology;  Laterality: N/A;  . DENTAL SURGERY     EXTRACTIONS  . ESOPHAGOGASTRODUODENOSCOPY N/A 08/03/2018   Procedure: ESOPHAGOGASTRODUODENOSCOPY (EGD);  Surgeon: Lin Landsman, MD;  Location: [facility] ENDOSCOPY;  Service: Gastroenterology;  Laterality: N/A;  . ESOPHAGOGASTRODUODENOSCOPY (EGD) WITH PROPOFOL N/A 02/10/2018   Procedure: ESOPHAGOGASTRODUODENOSCOPY (EGD) WITH PROPOFOL;  Surgeon: Jonathon Bellows, MD;  Location: [facility] ENDOSCOPY;  Service: Gastroenterology;  Laterality: N/A;  . ESOPHAGOGASTRODUODENOSCOPY (EGD) WITH PROPOFOL N/A 03/25/2018   Procedure: ESOPHAGOGASTRODUODENOSCOPY (EGD) WITH PROPOFOL;  Surgeon: Lin Landsman, MD;  Location: [facility] ENDOSCOPY;  Service: Gastroenterology;  Laterality: N/A;  . ESOPHAGOGASTRODUODENOSCOPY (EGD) WITH PROPOFOL N/A 04/22/2018   Procedure: ESOPHAGOGASTRODUODENOSCOPY (EGD) WITH PROPOFOL with band ligation;  Surgeon: Lin Landsman, MD;  Location: [place];  Service: Gastroenterology;  Laterality: N/A;  . ESOPHAGOGASTRODUODENOSCOPY (EGD) WITH PROPOFOL N/A 06/24/2018   Procedure: ESOPHAGOGASTRODUODENOSCOPY (EGD) WITH PROPOFOL;  Surgeon: Lin Landsman, MD;  Location: [facility] ENDOSCOPY;  Service: Gastroenterology;  Laterality: N/A;  . EYE SURGERY    . HEMORRHOID  BANDING  03/25/2018   Procedure: HEMORRHOID BANDING;  Surgeon: Lin Landsman, MD;  Location: [facility] ENDOSCOPY;  Service: Gastroenterology;;  . IR EMBO ART  VEN HEMORR LYMPH EXTRAV  INC GUIDE ROADMAPPING  08/07/2018  . IR RADIOLOGIST EVAL & MGMT  09/03/2018  . IR TIPS  08/07/2018  . RADIOLOGY WITH ANESTHESIA N/A 08/07/2018   Procedure: TIPS;  Surgeon: Corrie Mckusick, DO;  Location: [place];  Service: Anesthesiology;  Laterality: N/A;     Current Outpatient Medications:  .  cyanocobalamin (,VITAMIN B-12,) 1000 MCG/ML injection, Inject 1,000 mcg into the muscle once.,  Disp: , Rfl:  .  furosemide (LASIX) 20 MG tablet, , Disp: , Rfl:  .  metFORMIN (GLUCOPHAGE) 500 MG tablet, Take 1,000 mg by mouth 2 (two) times daily with a meal. , Disp: , Rfl:  .  nystatin (MYCOSTATIN/NYSTOP) powder, Apply 1 g topically as needed (rash). , Disp: , Rfl:  .  ondansetron (ZOFRAN) 4 MG tablet, Take 1 tablet (4 mg total) by mouth every 6 (six) hours as needed for nausea., Disp: 20 tablet, Rfl: 0 .  rifaximin (XIFAXAN) 550 MG TABS tablet, Take 1 tablet (550 mg total) by mouth 2 (two) times daily., Disp: 180 tablet, Rfl: 1 .  spironolactone (ALDACTONE) 50 MG tablet, Take 2 tablets (100 mg total) by mouth daily., Disp: 180 tablet, Rfl: 1 .  omeprazole (PRILOSEC) 40 MG capsule, Take by mouth daily., Disp: , Rfl: 5 .  pantoprazole (PROTONIX) 40 MG tablet, Take 1 tablet (40 mg total) by mouth 2 (two) times daily., Disp: 60 tablet, Rfl: 0 .  propranolol (INDERAL) 20 MG tablet, Take 1 tablet (20 mg total) by mouth 2 (two) times daily., Disp: 180 tablet, Rfl: 1   Family History  Problem Relation Age of Onset  . CAD Father   . Cancer Maternal Uncle      Social History   Tobacco Use  . Smoking status: Never Smoker  . Smokeless tobacco: Never Used  Substance Use Topics  . Alcohol use: No    Frequency: Never  . Drug use: Never    Allergies as of 09/11/2018  . (No Known Allergies)    Review of Systems:    All systems reviewed and negative except where noted in HPI.   Physical Exam:  BP 124/77 (BP Location: Left Arm, Patient Position: Sitting, Cuff Size: Large)   Pulse 65   Resp 16   Ht 5' 2.9" (1.598 m)   Wt 177 lb (80.3 kg)   BMI 31.45 kg/m  No LMP recorded. Patient is postmenopausal.  General:   Alert,  Well-developed, well-nourished, pleasant and cooperative in NAD, lethargic Head:  Normocephalic and atraumatic. Eyes:  Sclera clear, no icterus.   Conjunctiva pink. Ears:  Normal auditory acuity. Nose:  No deformity, discharge, or lesions. Mouth:  No deformity or  lesions,oropharynx pink & moist. Neck:  Supple; no masses or thyromegaly. Lungs:  Respirations even and unlabored.  Clear throughout to auscultation.   No wheezes, crackles, or rhonchi. No acute distress. Heart:  Regular rate and rhythm; no murmurs, clicks, rubs, or gallops. Abdomen:  Normal bowel sounds. Soft, non-tender and nondistended, without masses, hepatosplenomegaly or hernias noted.  No guarding or rebound tenderness.   Rectal: Not performed Msk:  Symmetrical without gross deformities. muscle wasting in bilateral upper extremities Good, equal movement & strength bilaterally. Pulses:  Normal pulses noted. Extremities:  No clubbing, trace edema, No cyanosis. Neurologic:  Alert and oriented x3;  grossly normal neurologically. Skin: telangiectasias  on upper anterior chest, No jaundice. Lymph Nodes:  No significant cervical adenopathy. Psych:  Alert and cooperative. Normal mood and affect.  Imaging Studies: Reviewed. CT A/P with contrast on 02/09/2018 revealed mildly enlarged retroperitoneal lymph nodes, largest with 11 mm concerning for metastatic disease or malignancy,18 mm spiculated density in the right lower lobe concerning for malignancy associated with small left pleural effusion  Assessment and Plan:   Kitt Minardi Arbogast is a 67 y.o. Caucasian female with metabolic syndrome, with decompensated cirrhosis secondary to ascites and refractory variceal bleed from esophageal varices status post TIPS and coil embolization of the left gastric vein here for follow-up  Decompensated cirrhosis: child class B, MELD-Na 9 Secondary liver disease workup positive for HCV antibodies, but viral load not detected, anti-smooth muscle antibodies weakly positive. 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 Ceruloplasmin, antimitochondrial antibodies, hepatitis B, alpha-1 antitrypsin came back negative Portal hypertension: manifested as ascites, esophageal  varices, splenomegaly S/p TIPS: Secondary to refractory variceal bleed. She will follow-up with interventional radiology in 2 weeks Ascites/volume overload:currently euvolemic, as patient has TIPS, stopped Lasix, continue spironolactone 54m daily. Suggested her to check weight periodically, continue low-sodium diet. Avoid processed meats, and foods, chips, carbonated beverages. Provided her with educational material about low-sodium diet during initial visit Varices: variceal bleed from esophageal varices, status post EGD on 02/10/2018, EVLx10, status post EGD 03/25/2018 EVLx4, s/p EGD 04/22/18 EVLx2 and hemospray, refractory variceal bleed 08/03/2018, status post TIPS with coil embolization of left gastric vein on 08/07/2018.  Stopped propranolol  Repeat EGD in 11/2018.    Anemia: Resolved, multifactorial- combination of peptic ulcer disease,gastric AVMs, portal hypertensive gastropathy, esophageal varices. she has developed severe iron deficiency anemia secondary to variceal hemorrhage, peptic ulcer disease, gastric AVMs and portal hypertensive gastropathy. Hemoglobin  stable , iron studies revealed improved ferritin levels, B12 deficiency has resolved. There is no evidence of coagulopathy or thrombocytopenia HCC screening: AFP normal, no liver lesions on CT in 07/2018.  Repeat ultrasound in 1 year PSE: Started on lactulose post TIPS.  Patient appears to be foggy, lethargic and difficulty balancing.  Continue rifaximin 550 mg twice daily, long-term.  HRS: none Hepatic hydrothorax: none  Health maintenance:  She is immune to hepatitis B She received Pneumovax and influenza vaccine in 07/2018 vitamin D levels 29.2 on 03/15/2018 Avoid excess Tylenol and NSAIDs use Colon cancer screening:underwent colonoscopy on 04/11/2018, One 5 mm polyp in the ascending colon, removed with a cold snare, Patch showed tubular adenoma with no evidence of high-grade dysplasia or malignancy. Recommend repeat colonoscopy in 5  years Right lower lobe lung lesion and retroperitoneal lymphadenopathy: follow-up with Dr. CMike Gip no evidence of retroperitoneal lymphadenopathy. Repeat CT chest revealed that the lesions were stable. She was recommended to undergo bronchoscopy and referred to Dr KMortimer Fries Patient said she will think about it whether to undergo bronchoscopy are not  Follow up in 6 months Patient instructions provided    RCephas Darby MD

## 2018-09-11 NOTE — Patient Instructions (Addendum)
1. Take spironolactone 25m, 1 pill daily 2. Take rifaximin 5559mBID 3. Continue protonix 4047mwice daily 4. Continue lactulose 59m24mD  Please call our office to speak with my nurse [name] if you have any questions/concerns. During after hours, you will be redirected to on call GI physician. For any emergency please call 911 or go the nearest emergency room.    RohiCephas Darby [id number] CourtitHutsonvillerlAur[place] [id number]: [phone]: [phone]

## 2018-09-12 ENCOUNTER — Telehealth: Payer: Self-pay | Admitting: Gastroenterology

## 2018-09-12 NOTE — Telephone Encounter (Signed)
Patient called in & left message on the answering machine that she no longer is taking Propranolo & Omerprazole dr.

## 2018-09-13 DIAGNOSIS — R69 Illness, unspecified: Secondary | ICD-10-CM | POA: Diagnosis not present

## 2018-09-18 ENCOUNTER — Ambulatory Visit: Payer: Medicare HMO | Admitting: Gastroenterology

## 2018-09-25 ENCOUNTER — Inpatient Hospital Stay: Payer: Medicare HMO | Attending: Hematology and Oncology

## 2018-09-25 ENCOUNTER — Inpatient Hospital Stay: Payer: Medicare HMO

## 2018-09-25 VITALS — BP 117/68 | HR 60 | Temp 96.0°F | Resp 16

## 2018-09-25 DIAGNOSIS — E538 Deficiency of other specified B group vitamins: Secondary | ICD-10-CM | POA: Diagnosis not present

## 2018-09-25 DIAGNOSIS — D509 Iron deficiency anemia, unspecified: Secondary | ICD-10-CM | POA: Insufficient documentation

## 2018-09-25 DIAGNOSIS — R911 Solitary pulmonary nodule: Secondary | ICD-10-CM

## 2018-09-25 DIAGNOSIS — D500 Iron deficiency anemia secondary to blood loss (chronic): Secondary | ICD-10-CM

## 2018-09-25 LAB — CBC WITH DIFFERENTIAL/PLATELET
Abs Immature Granulocytes: 0 10*3/uL (ref 0.00–0.07)
Basophils Absolute: 0 10*3/uL (ref 0.0–0.1)
Basophils Relative: 1 %
Eosinophils Absolute: 0.3 10*3/uL (ref 0.0–0.5)
Eosinophils Relative: 10 %
HCT: 36.2 % (ref 36.0–46.0)
Hemoglobin: 11.5 g/dL — ABNORMAL LOW (ref 12.0–15.0)
Immature Granulocytes: 0 %
Lymphocytes Relative: 34 %
Lymphs Abs: 1.2 10*3/uL (ref 0.7–4.0)
MCH: 27.3 pg (ref 26.0–34.0)
MCHC: 31.8 g/dL (ref 30.0–36.0)
MCV: 85.8 fL (ref 80.0–100.0)
Monocytes Absolute: 0.3 10*3/uL (ref 0.1–1.0)
Monocytes Relative: 8 %
Neutro Abs: 1.6 10*3/uL — ABNORMAL LOW (ref 1.7–7.7)
Neutrophils Relative %: 47 %
Platelets: 131 10*3/uL — ABNORMAL LOW (ref 150–400)
RBC: 4.22 MIL/uL (ref 3.87–5.11)
RDW: 16.3 % — ABNORMAL HIGH (ref 11.5–15.5)
WBC: 3.5 10*3/uL — ABNORMAL LOW (ref 4.0–10.5)
nRBC: 0 % (ref 0.0–0.2)

## 2018-09-25 LAB — FERRITIN: Ferritin: 114 ng/mL (ref 11–307)

## 2018-09-25 MED ORDER — CYANOCOBALAMIN 1000 MCG/ML IJ SOLN
1000.0000 ug | Freq: Once | INTRAMUSCULAR | Status: AC
  Administered 2018-09-25: 1000 ug via INTRAMUSCULAR

## 2018-09-25 NOTE — Patient Instructions (Signed)
Cyanocobalamin, Vitamin B12 injection What is this medicine? CYANOCOBALAMIN (sye an oh koe BAL a min) is a man made form of vitamin B12. Vitamin B12 is used in the growth of healthy blood cells, nerve cells, and proteins in the body. It also helps with the metabolism of fats and carbohydrates. This medicine is used to treat people who can not absorb vitamin B12. This medicine may be used for other purposes; ask your health care provider or pharmacist if you have questions. COMMON BRAND NAME(S): B-12 Compliance Kit, B-12 Injection Kit, Cyomin, LA-12, Nutri-Twelve, Physicians EZ Use B-12, Primabalt What should I tell my health care provider before I take this medicine? They need to know if you have any of these conditions: -kidney disease -Leber's disease -megaloblastic anemia -an unusual or allergic reaction to cyanocobalamin, cobalt, other medicines, foods, dyes, or preservatives -pregnant or trying to get pregnant -breast-feeding How should I use this medicine? This medicine is injected into a muscle or deeply under the skin. It is usually given by a health care professional in a clinic or doctor's office. However, your doctor may teach you how to inject yourself. Follow all instructions. Talk to your pediatrician regarding the use of this medicine in children. Special care may be needed. Overdosage: If you think you have taken too much of this medicine contact a poison control center or emergency room at once. NOTE: This medicine is only for you. Do not share this medicine with others. What if I miss a dose? If you are given your dose at a clinic or doctor's office, call to reschedule your appointment. If you give your own injections and you miss a dose, take it as soon as you can. If it is almost time for your next dose, take only that dose. Do not take double or extra doses. What may interact with this medicine? -colchicine -heavy alcohol intake This list may not describe all possible  interactions. Give your health care provider a list of all the medicines, herbs, non-prescription drugs, or dietary supplements you use. Also tell them if you smoke, drink alcohol, or use illegal drugs. Some items may interact with your medicine. What should I watch for while using this medicine? Visit your doctor or health care professional regularly. You may need blood work done while you are taking this medicine. You may need to follow a special diet. Talk to your doctor. Limit your alcohol intake and avoid smoking to get the best benefit. What side effects may I notice from receiving this medicine? Side effects that you should report to your doctor or health care professional as soon as possible: -allergic reactions like skin rash, itching or hives, swelling of the face, lips, or tongue -blue tint to skin -chest tightness, pain -difficulty breathing, wheezing -dizziness -red, swollen painful area on the leg Side effects that usually do not require medical attention (report to your doctor or health care professional if they continue or are bothersome): -diarrhea -headache This list may not describe all possible side effects. Call your doctor for medical advice about side effects. You may report side effects to FDA at 1-800-FDA-1088. Where should I keep my medicine? Keep out of the reach of children. Store at room temperature between 15 and 30 degrees C (59 and 85 degrees F). Protect from light. Throw away any unused medicine after the expiration date. NOTE: This sheet is a summary. It may not cover all possible information. If you have questions about this medicine, talk to your doctor, pharmacist, or  health care provider.  2018 Elsevier/Gold Standard (2008-02-10 22:10:20)

## 2018-10-02 ENCOUNTER — Other Ambulatory Visit: Payer: Medicare HMO

## 2018-10-03 DIAGNOSIS — E119 Type 2 diabetes mellitus without complications: Secondary | ICD-10-CM | POA: Diagnosis not present

## 2018-10-03 DIAGNOSIS — D649 Anemia, unspecified: Secondary | ICD-10-CM | POA: Diagnosis not present

## 2018-10-03 DIAGNOSIS — J449 Chronic obstructive pulmonary disease, unspecified: Secondary | ICD-10-CM | POA: Diagnosis not present

## 2018-10-03 DIAGNOSIS — E669 Obesity, unspecified: Secondary | ICD-10-CM | POA: Diagnosis not present

## 2018-10-03 DIAGNOSIS — K922 Gastrointestinal hemorrhage, unspecified: Secondary | ICD-10-CM | POA: Diagnosis not present

## 2018-10-07 ENCOUNTER — Telehealth: Payer: Self-pay | Admitting: Gastroenterology

## 2018-10-07 NOTE — Telephone Encounter (Signed)
Pt ios calling to speak with Dr. Marius Ditch nurse regarding a nose bleed she was told to let Dr. Marius Ditch know if she started bleeding anywhere

## 2018-10-07 NOTE — Telephone Encounter (Signed)
Patient contacted office stated she had a nose bleed this am.  I asked if she had any nasal congestion.  She said yes she was a little congested.  I advised her to inform her PCP of the occurrence and asked her to use a humidifier explaining that it could have been her nasal passage being dry from the heat.  Thanks [employer]

## 2018-10-11 ENCOUNTER — Encounter: Payer: Self-pay | Admitting: Hematology and Oncology

## 2018-10-21 ENCOUNTER — Encounter: Payer: Self-pay | Admitting: Podiatry

## 2018-10-21 ENCOUNTER — Ambulatory Visit (INDEPENDENT_AMBULATORY_CARE_PROVIDER_SITE_OTHER): Payer: Medicare HMO | Admitting: Podiatry

## 2018-10-21 DIAGNOSIS — L600 Ingrowing nail: Secondary | ICD-10-CM

## 2018-10-21 MED ORDER — NEOMYCIN-POLYMYXIN-HC 3.5-10000-1 OT SOLN: OTIC | 0 refills | Status: DC

## 2018-10-21 NOTE — Patient Instructions (Signed)

## 2018-10-23 ENCOUNTER — Inpatient Hospital Stay: Payer: Medicare HMO | Attending: Hematology and Oncology

## 2018-10-23 ENCOUNTER — Other Ambulatory Visit: Payer: Self-pay | Admitting: Urgent Care

## 2018-10-23 VITALS — BP 128/69 | HR 66 | Temp 95.8°F | Resp 18

## 2018-10-23 DIAGNOSIS — E538 Deficiency of other specified B group vitamins: Secondary | ICD-10-CM

## 2018-10-23 MED ORDER — CYANOCOBALAMIN 1000 MCG/ML IJ SOLN
INTRAMUSCULAR | Status: AC
  Filled 2018-10-23: qty 1

## 2018-10-23 MED ORDER — CYANOCOBALAMIN 1000 MCG/ML IJ SOLN
1000.0000 ug | Freq: Once | INTRAMUSCULAR | Status: AC
  Administered 2018-10-23: 1000 ug via INTRAMUSCULAR

## 2018-10-23 NOTE — Progress Notes (Signed)
Subjective:   Patient ID: Crystal [name], female   DOB: 67 y.o.   MRN: [id number]   HPI Patient presents with painful ingrown toenail left big toe and states she is tried to soak it without relief and trim it herself.  Patient has good digital perfusion and is well oriented x3   Review of Systems  All other systems reviewed and are negative.       Objective:  Physical Exam  Constitutional: She appears well-developed and well-nourished.  Cardiovascular: Intact distal pulses.  Pulmonary/Chest: Effort normal.  Musculoskeletal: Normal range of motion.  Neurological: She is alert.  Skin: Skin is warm.  Nursing note and vitals reviewed.   Neurovascular status found to be intact muscle strength is adequate range of motion within normal limits with patient found to have an incurvated lateral border left hallux that is painful with no drainage or redness noted.  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  Explained procedure and risk patient wants procedure and signed consent form.  Today I infiltrated the left hallux 60 mg like Marcaine mixture remove the lateral border exposed matrix applied phenol 3 applications 30 seconds followed by alcohol lavage sterile dressing and gave instructions on soaks and leave dressing on 24 hours or take it off earlier if it should start to throb.  Wrote prescription for Corticosporin otic solution at this time

## 2018-10-30 DIAGNOSIS — R69 Illness, unspecified: Secondary | ICD-10-CM | POA: Diagnosis not present

## 2018-10-31 ENCOUNTER — Other Ambulatory Visit: Payer: Self-pay | Admitting: Gastroenterology

## 2018-10-31 DIAGNOSIS — R69 Illness, unspecified: Secondary | ICD-10-CM | POA: Diagnosis not present

## 2018-11-10 ENCOUNTER — Other Ambulatory Visit: Payer: Self-pay | Admitting: Gastroenterology

## 2018-11-18 ENCOUNTER — Encounter: Payer: Self-pay | Admitting: Student

## 2018-11-19 ENCOUNTER — Encounter
Admission: RE | Disposition: A | Discharge status: Home / Self Care | Payer: Self-pay | Source: Home / Self Care | Attending: Gastroenterology

## 2018-11-19 ENCOUNTER — Ambulatory Visit: Payer: Medicare HMO | Admitting: Anesthesiology

## 2018-11-19 ENCOUNTER — Ambulatory Visit
Admission: RE | Admit: 2018-11-19 | Discharge: 2018-11-19 | Disposition: A | Discharge status: Home / Self Care | Payer: Medicare HMO | Attending: Gastroenterology | Admitting: Gastroenterology

## 2018-11-19 ENCOUNTER — Encounter: Payer: Self-pay | Admitting: *Deleted

## 2018-11-19 DIAGNOSIS — K7460 Unspecified cirrhosis of liver: Secondary | ICD-10-CM | POA: Diagnosis not present

## 2018-11-19 DIAGNOSIS — Z79899 Other long term (current) drug therapy: Secondary | ICD-10-CM | POA: Diagnosis not present

## 2018-11-19 DIAGNOSIS — Z9889 Other specified postprocedural states: Secondary | ICD-10-CM | POA: Diagnosis not present

## 2018-11-19 DIAGNOSIS — K31819 Angiodysplasia of stomach and duodenum without bleeding: Secondary | ICD-10-CM

## 2018-11-19 DIAGNOSIS — Z8719 Personal history of other diseases of the digestive system: Secondary | ICD-10-CM | POA: Insufficient documentation

## 2018-11-19 DIAGNOSIS — K31811 Angiodysplasia of stomach and duodenum with bleeding: Secondary | ICD-10-CM | POA: Diagnosis not present

## 2018-11-19 DIAGNOSIS — Z7984 Long term (current) use of oral hypoglycemic drugs: Secondary | ICD-10-CM | POA: Insufficient documentation

## 2018-11-19 DIAGNOSIS — D510 Vitamin B12 deficiency anemia due to intrinsic factor deficiency: Secondary | ICD-10-CM | POA: Diagnosis not present

## 2018-11-19 DIAGNOSIS — I8500 Esophageal varices without bleeding: Secondary | ICD-10-CM | POA: Diagnosis not present

## 2018-11-19 DIAGNOSIS — D500 Iron deficiency anemia secondary to blood loss (chronic): Secondary | ICD-10-CM | POA: Insufficient documentation

## 2018-11-19 DIAGNOSIS — E119 Type 2 diabetes mellitus without complications: Secondary | ICD-10-CM | POA: Insufficient documentation

## 2018-11-19 DIAGNOSIS — Z8711 Personal history of peptic ulcer disease: Secondary | ICD-10-CM | POA: Insufficient documentation

## 2018-11-19 HISTORY — PX: ESOPHAGOGASTRODUODENOSCOPY (EGD) WITH PROPOFOL: SHX5813

## 2018-11-19 LAB — GLUCOSE, CAPILLARY
Glucose-Capillary: 53 mg/dL — ABNORMAL LOW (ref 70–99)
Glucose-Capillary: 111 mg/dL — ABNORMAL HIGH (ref 70–99)
Glucose-Capillary: 67 mg/dL — ABNORMAL LOW (ref 70–99)
Glucose-Capillary: 100 mg/dL — ABNORMAL HIGH (ref 70–99)

## 2018-11-19 SURGERY — ESOPHAGOGASTRODUODENOSCOPY (EGD) WITH PROPOFOL
Anesthesia: General

## 2018-11-19 MED ORDER — LIDOCAINE HCL (CARDIAC) PF 100 MG/5ML IV SOSY
PREFILLED_SYRINGE | INTRAVENOUS | Status: DC | PRN
  Administered 2018-11-19: 40 mg via INTRAVENOUS

## 2018-11-19 MED ORDER — DEXTROSE 50 % IV SOLN
INTRAVENOUS | Status: AC
  Administered 2018-11-19: 25 g
  Filled 2018-11-19: qty 50

## 2018-11-19 MED ORDER — PROPOFOL 500 MG/50ML IV EMUL
INTRAVENOUS | Status: DC | PRN
  Administered 2018-11-19: 175 ug/kg/min via INTRAVENOUS

## 2018-11-19 MED ORDER — SODIUM CHLORIDE 0.9 % IV SOLN
INTRAVENOUS | Status: DC
  Administered 2018-11-19: 1000 mL via INTRAVENOUS

## 2018-11-19 MED ORDER — EPHEDRINE SULFATE 50 MG/ML IJ SOLN
INTRAMUSCULAR | Status: AC
  Filled 2018-11-19: qty 1

## 2018-11-19 MED ORDER — MIDAZOLAM HCL 2 MG/2ML IJ SOLN
INTRAMUSCULAR | Status: AC
  Filled 2018-11-19: qty 2

## 2018-11-19 MED ORDER — GLYCOPYRROLATE 0.2 MG/ML IJ SOLN
INTRAMUSCULAR | Status: AC
  Filled 2018-11-19: qty 1

## 2018-11-19 MED ORDER — MIDAZOLAM HCL 2 MG/2ML IJ SOLN
INTRAMUSCULAR | Status: DC | PRN
  Administered 2018-11-19: 2 mg via INTRAVENOUS

## 2018-11-19 MED ORDER — SODIUM CHLORIDE (PF) 0.9 % IJ SOLN
INTRAMUSCULAR | Status: AC
  Filled 2018-11-19: qty 10

## 2018-11-19 MED ORDER — LIDOCAINE HCL (PF) 2 % IJ SOLN
INTRAMUSCULAR | Status: AC
  Filled 2018-11-19: qty 10

## 2018-11-19 MED ORDER — DEXTROSE 50 % IV SOLN
25.0000 mL | Freq: Once | INTRAVENOUS | Status: AC
  Administered 2018-11-19: 25 mL via INTRAVENOUS

## 2018-11-19 MED ORDER — DEXTROSE 50 % IV SOLN: 25.0000 mL | Freq: Once | INTRAVENOUS | Status: DC

## 2018-11-19 MED ORDER — PROPOFOL 500 MG/50ML IV EMUL
INTRAVENOUS | Status: AC
  Filled 2018-11-19: qty 50

## 2018-11-19 MED ORDER — GLYCOPYRROLATE 0.2 MG/ML IJ SOLN
INTRAMUSCULAR | Status: DC | PRN
  Administered 2018-11-19: 0.2 mg via INTRAVENOUS

## 2018-11-19 MED ORDER — PROPOFOL 10 MG/ML IV BOLUS
INTRAVENOUS | Status: DC | PRN
  Administered 2018-11-19: 90 mg via INTRAVENOUS

## 2018-11-19 NOTE — Transfer of Care (Signed)
Immediate Anesthesia Transfer of Care Note  Patient: Crystal [name]  Procedure(s) Performed: Procedure(s): ESOPHAGOGASTRODUODENOSCOPY (EGD) WITH PROPOFOL (N/A)  Patient Location: PACU and Endoscopy Unit  Anesthesia Type:General  Level of Consciousness: sedated  Airway & Oxygen Therapy: Patient Spontanous Breathing and Patient connected to nasal cannula oxygen  Post-op Assessment: Report given to RN and Post -op Vital signs reviewed and stable  Post vital signs: Reviewed and stable  Last Vitals:  Vitals:   11/19/18 1337 11/19/18 1648  BP: 123/74 119/67  Pulse: 65 78  Resp:  (!) 21  Temp: (!) 35.6 C (!) 36.1 C  SpO2:  01%    Complications: No apparent anesthesia complications

## 2018-11-19 NOTE — H&P (Signed)
[name], MD [address]  [place]  [place], [place] [address]  Main: [phone]  Fax: [phone] Pager: [phone]  Primary Care Physician:  Lorelee Market, MD Primary Gastroenterologist:  Dr. [name]  Pre-Procedure History & Physical: HPI:  Crystal [name] is a 68 y.o. female is here for an endoscopy.   Past Medical History:  Diagnosis Date  . Acute GI bleeding 02/09/2018  . Acute upper gastrointestinal bleeding 08/03/2018  . Anemia    TAKES IRON TAB  . Arthritis    SHOULDER  . B12 deficiency 02/18/2018  . Bleeding    GI  3/19  . Bronchitis    HX OF  . Cirrhosis ([place])   . Colon cancer screening   . Diabetes mellitus without complication ([place])    TYPE 2  . Fatty (change of) liver, not elsewhere classified   . Full dentures    UPPER AND LOWER  . Iron deficiency anemia due to chronic blood loss 02/18/2018  . Lung nodule, multiple   . Pernicious anemia 02/22/2018  . PUD (peptic ulcer disease)   . Upper GI bleeding 08/03/2018    Past Surgical History:  Procedure Laterality Date  . CATARACT EXTRACTION W/PHACO Right 02/06/2018   Procedure: CATARACT EXTRACTION PHACO AND INTRAOCULAR LENS PLACEMENT ([place]) RIGHT DIABETIC;  Surgeon: Leandrew Koyanagi, MD;  Location: [place];  Service: Ophthalmology;  Laterality: Right;  DIABETIC, ORAL MED  . CATARACT EXTRACTION W/PHACO Left 04/30/2018   Procedure: CATARACT EXTRACTION PHACO AND INTRAOCULAR LENS PLACEMENT ([facility]);  Surgeon: Leandrew Koyanagi, MD;  Location: [facility] ORS;  Service: Ophthalmology;  Laterality: Left;  [place] 01:04 AP% 21.3 CDE 13.66 Fluid pack lot # [id number]  . COLONOSCOPY WITH PROPOFOL N/A 03/25/2018   Procedure: COLONOSCOPY WITH PROPOFOL;  Surgeon: Lin Landsman, MD;  Location: [facility] ENDOSCOPY;  Service: Gastroenterology;  Laterality: N/A;  . DENTAL SURGERY     EXTRACTIONS  . ESOPHAGOGASTRODUODENOSCOPY N/A 08/03/2018   Procedure: ESOPHAGOGASTRODUODENOSCOPY (EGD);  Surgeon:  Lin Landsman, MD;  Location: [facility] ENDOSCOPY;  Service: Gastroenterology;  Laterality: N/A;  . ESOPHAGOGASTRODUODENOSCOPY (EGD) WITH PROPOFOL N/A 02/10/2018   Procedure: ESOPHAGOGASTRODUODENOSCOPY (EGD) WITH PROPOFOL;  Surgeon: Jonathon Bellows, MD;  Location[facility] ENDOSCOPY;  Service: Gastroenterology;  Laterality: N/A;  . ESOPHAGOGASTRODUODENOSCOPY (EGD) WITH PROPOFOL N/A 03/25/2018   Procedure: ESOPHAGOGASTRODUODENOSCOPY (EGD) WITH PROPOFOL;  Surgeon: Lin Landsman, MD;  Location: [facility] ENDOSCOPY;  Service: Gastroenterology;  Laterality: N/A;  . ESOPHAGOGASTRODUODENOSCOPY (EGD) WITH PROPOFOL N/A 04/22/2018   Procedure: ESOPHAGOGASTRODUODENOSCOPY (EGD) WITH PROPOFOL with band ligation;  Surgeon: Lin Landsman, MD;  Location: [place];  Service: Gastroenterology;  Laterality: N/A;  . ESOPHAGOGASTRODUODENOSCOPY (EGD) WITH PROPOFOL N/A 06/24/2018   Procedure: ESOPHAGOGASTRODUODENOSCOPY (EGD) WITH PROPOFOL;  Surgeon: Lin Landsman, MD;  Location: [facility] ENDOSCOPY;  Service: Gastroenterology;  Laterality: N/A;  . EYE SURGERY    . HEMORRHOID BANDING  03/25/2018   Procedure: HEMORRHOID BANDING;  Surgeon: Lin Landsman, MD;  Location: [facility] ENDOSCOPY;  Service: Gastroenterology;;  . IR EMBO ART  VEN HEMORR LYMPH EXTRAV  INC GUIDE ROADMAPPING  08/07/2018  . IR RADIOLOGIST EVAL & MGMT  09/03/2018  . IR TIPS  08/07/2018  . RADIOLOGY WITH ANESTHESIA N/A 08/07/2018   Procedure: TIPS;  Surgeon: Corrie Mckusick, DO;  Location: [place];  Service: Anesthesiology;  Laterality: N/A;    Prior to Admission medications   Medication Sig Start Date End Date Taking? Authorizing Provider  cyanocobalamin (,VITAMIN B-12,) 1000 MCG/ML injection Inject 1,000 mcg into the muscle once.  Yes [provider]  furosemide (LASIX) 20 MG tablet  09/10/18  Yes [provider]  lactulose (CHRONULAC) 10 GM/15ML solution TAKE 30 MLS (20 G TOTAL) BY MOUTH 2 (TWO) TIMES DAILY. 11/14/18 02/12/19 Yes Vanga,  Tally Due, MD  metFORMIN (GLUCOPHAGE) 500 MG tablet Take 1,000 mg by mouth 2 (two) times daily with a meal.    Yes [provider]  ondansetron (ZOFRAN) 4 MG tablet Take 1 tablet (4 mg total) by mouth every 6 (six) hours as needed for nausea. 08/03/18  Yes Rosine Door, MD  ONE TOUCH ULTRA TEST test strip 2 (two) times daily. 09/13/18  Yes [provider]  Jonetta Speak LANCETS 35T MISC 2 (two) times daily. 09/13/18  Yes [provider]  pantoprazole (PROTONIX) 40 MG tablet Take 40 mg by mouth 2 (two) times daily. 10/05/18  Yes [provider]  rifaximin (XIFAXAN) 550 MG TABS tablet Take 1 tablet (550 mg total) by mouth 2 (two) times daily. 08/27/18 02/23/19 Yes Vanga, Tally Due, MD  cephALEXin (KEFLEX) 500 MG capsule Take 500 mg by mouth 2 (two) times daily. 10/03/18   [provider]  fluconazole (DIFLUCAN) 150 MG tablet TAKE 1 TABLET NOW, IF NO IMPROVEMENT REPEAT IN 1 WEEK 09/20/18   [provider]  neomycin-polymyxin-hydrocortisone (CORTISPORIN) OTIC solution Apply 1-2 drops to toe after soaking twice a day Patient not taking: Reported on 11/19/2018 10/21/18   Wallene Huh, DPM  nystatin (MYCOSTATIN/NYSTOP) powder Apply 1 g topically as needed (rash).  05/23/18   [provider]  pantoprazole (PROTONIX) 40 MG tablet Take 1 tablet (40 mg total) by mouth 2 (two) times daily. 08/10/18 09/09/18  Arrien, Jimmy Picket, MD  spironolactone (ALDACTONE) 50 MG tablet Take 2 tablets (100 mg total) by mouth daily. Patient taking differently: Take 50 mg by mouth daily.  05/24/18 11/20/18  Lin Landsman, MD    Allergies as of 07/08/2018  . (No Known Allergies)    Family History  Problem Relation Age of Onset  . CAD Father   . Cancer Maternal Uncle     Social History   Socioeconomic History  . Marital status: Single    Spouse name: Not on file  . Number of children: Not on file  . Years of education: Not on file  . Highest  education level: Not on file  Occupational History  . Not on file  Social Needs  . Financial resource strain: Not on file  . Food insecurity:    Worry: Not on file    Inability: Not on file  . Transportation needs:    Medical: Not on file    Non-medical: Not on file  Tobacco Use  . Smoking status: Never Smoker  . Smokeless tobacco: Never Used  Substance and Sexual Activity  . Alcohol use: No    Frequency: Never  . Drug use: Never  . Sexual activity: Not on file  Lifestyle  . Physical activity:    Days per week: Not on file    Minutes per session: Not on file  . Stress: Not on file  Relationships  . Social connections:    Talks on phone: Not on file    Gets together: Not on file    Attends religious service: Not on file    Active member of club or organization: Not on file    Attends meetings of clubs or organizations: Not on file    Relationship status: Not on file  . Intimate partner violence:  Fear of current or ex partner: Not on file    Emotionally abused: Not on file    Physically abused: Not on file    Forced sexual activity: Not on file  Other Topics Concern  . Not on file  Social History Narrative   Independent at baseline. Lives at home with family    Review of Systems: See HPI, otherwise negative ROS  Physical Exam: BP 123/74   Pulse 65   Temp (!) 96 F (35.6 C)   Ht 5' 3" (1.6 m)   Wt 81.6 kg   LMP  (LMP Unknown)   BMI 31.89 kg/m  General:   Alert,  pleasant and cooperative in NAD Head:  Normocephalic and atraumatic. Neck:  Supple; no masses or thyromegaly. Lungs:  Clear throughout to auscultation.    Heart:  Regular rate and rhythm. Abdomen:  Soft, nontender and nondistended. Normal bowel sounds, without guarding, and without rebound.   Neurologic:  Alert and  oriented x4;  grossly normal neurologically.  Impression/Plan: Crystal [name] is here for an endoscopy to be performed for variceal surveillance  Risks, benefits, limitations,  and alternatives regarding  endoscopy have been reviewed with the patient.  Questions have been answered.  All parties agreeable.   Sherri Sear, MD  11/19/2018, 3:37 PM

## 2018-11-19 NOTE — Anesthesia Post-op Follow-up Note (Signed)
Anesthesia QCDR form completed.

## 2018-11-19 NOTE — Anesthesia Procedure Notes (Signed)
Date/Time: 11/19/2018 2:17 PM Performed by: Doreen Salvage, CRNA Pre-anesthesia Checklist: Patient identified, Emergency Drugs available, Suction available and Patient being monitored Patient Re-evaluated:Patient Re-evaluated prior to induction Oxygen Delivery Method: Nasal cannula Induction Type: IV induction Dental Injury: Teeth and Oropharynx as per pre-operative assessment  Comments: Nasal cannula with etCO2 monitoring

## 2018-11-19 NOTE — Op Note (Signed)
[facility] Gastroenterology Patient Name: Crystal [name] Procedure Date: 11/19/2018 3:18 PM MRN: [id number] Account #: [id number] Date of Birth: 1951/10/29 Admit Type: Outpatient Age: 68 Room: [facility] ENDO ROOM 4 Gender: Female Note Status: Finalized Procedure:            Upper GI endoscopy Indications:          Iron deficiency anemia secondary to chronic blood loss,                        Follow-up of esophageal varices, s/p TIPS Providers:            Lin Landsman MD, MD Referring MD:         Lorelee Market (Referring MD) Medicines:            Monitored Anesthesia Care Complications:        No immediate complications. Estimated blood loss: None. Procedure:            Pre-Anesthesia Assessment:                       - Prior to the procedure, a History and Physical was                        performed, and patient medications and allergies were                        reviewed. The patient is competent. The risks and                        benefits of the procedure and the sedation options and                        risks were discussed with the patient. All questions                        were answered and informed consent was obtained.                        Patient identification and proposed procedure were                        verified by the physician, the nurse, the                        anesthesiologist, the anesthetist and the technician in                        the pre-procedure area in the procedure room in the                        endoscopy suite. Mental Status Examination: alert and                        oriented. Airway Examination: normal oropharyngeal                        airway and neck mobility. Respiratory Examination:                        clear to auscultation. CV Examination: normal.  Prophylactic Antibiotics: The patient does not require                        prophylactic antibiotics. Prior Anticoagulants: The                         patient has taken no previous anticoagulant or                        antiplatelet agents. ASA Grade Assessment: IV - A                        patient with severe systemic disease that is a constant                        threat to life. After reviewing the risks and benefits,                        the patient was deemed in satisfactory condition to                        undergo the procedure. The anesthesia plan was to use                        monitored anesthesia care (MAC). Immediately prior to                        administration of medications, the patient was                        re-assessed for adequacy to receive sedatives. The                        heart rate, respiratory rate, oxygen saturations, blood                        pressure, adequacy of pulmonary ventilation, and                        response to care were monitored throughout the                        procedure. The physical status of the patient was                        re-assessed after the procedure.                       After obtaining informed consent, the endoscope was                        passed under direct vision. Throughout the procedure,                        the patient's blood pressure, pulse, and oxygen                        saturations were monitored continuously. The Endoscope  was introduced through the mouth, and advanced to the                        second part of duodenum. The upper GI endoscopy was                        accomplished without difficulty. The patient tolerated                        the procedure well. Findings:      The duodenal bulb and second portion of the duodenum were normal.      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 Coagulation for hemostasis using argon plasma was       successful.  Estimated blood loss: none. No gastric varices or ulcer      The gastroesophageal junction and examined esophagus were normal. There       were no esophageal varices. The scar from prior banding has nicely healed Impression:           - Normal duodenal bulb and second portion of the                        duodenum.                       - Gastric antral vascular ectasia with bleeding.                        Treated with argon plasma coagulation (APC).                       - Normal gastroesophageal junction and esophagus.                       - No specimens collected. Recommendation:       - Discharge patient to home (with escort).                       - Low sodium diet today.                       - Continue present medications.                       - Return to my office in 2 weeks. Procedure Code(s):    --- Professional ---                       [phone], Esophagogastroduodenoscopy, flexible, transoral;                        with control of bleeding, any method Diagnosis Code(s):    --- Professional ---                       W29.562, Angiodysplasia of stomach and duodenum with                        bleeding  D50.0, Iron deficiency anemia secondary to blood loss                        (chronic)                       I85.00, Esophageal varices without bleeding CPT copyright 2018 American Medical Association. All rights reserved. The codes documented in this report are preliminary and upon coder review may  be revised to meet current compliance requirements. Dr. Ulyess Mort Lin Landsman MD, MD 11/19/2018 4:49:55 PM This report has been signed electronically. Number of Addenda: 0 Note Initiated On: 11/19/2018 3:18 PM      [facility]

## 2018-11-19 NOTE — Anesthesia Preprocedure Evaluation (Signed)
Anesthesia Evaluation  Patient identified by MRN, date of birth, ID band Patient awake    Reviewed: Allergy & Precautions, H&P , NPO status , Patient's Chart, lab work & pertinent test results  Airway Mallampati: II  TM Distance: >3 FB Neck ROM: full    Dental  (+) Edentulous Lower, Edentulous Upper   Pulmonary neg pulmonary ROS,           Cardiovascular negative cardio ROS       Neuro/Psych negative neurological ROS  negative psych ROS   GI/Hepatic PUD, (+) Cirrhosis   Esophageal Varices    , Hepatitis -, CS/p TIPS procedure Esophageal varices s/p banding Current GI bleed   Endo/Other  diabetes  Renal/GU      Musculoskeletal  (+) Arthritis ,   Abdominal   Peds  Hematology negative hematology ROS (+) Blood dyscrasia, anemia , GI bleed, Hgb 8.5   Anesthesia Other Findings Past Medical History: No date: Anemia     Comment:  TAKES IRON TAB No date: Arthritis     Comment:  SHOULDER No date: Bleeding     Comment:  GI  3/19 No date: Bronchitis     Comment:  HX OF No date: Cirrhosis ([place]) No date: Diabetes mellitus without complication ([facility])     Comment:  TYPE 2 No date: Fatty (change of) liver, not elsewhere classified No date: Full dentures     Comment:  UPPER AND LOWER No date: Lung nodule, multiple  Past Surgical History: 02/06/2018: CATARACT EXTRACTION W/PHACO; Right     Comment:  Procedure: CATARACT EXTRACTION PHACO AND INTRAOCULAR               LENS PLACEMENT ([place]) RIGHT DIABETIC;  Surgeon:               Leandrew Koyanagi, MD;  Location: [place];              Service: Ophthalmology;  Laterality: Right;  DIABETIC,               ORAL MED 04/30/2018: CATARACT EXTRACTION W/PHACO; Left     Comment:  Procedure: CATARACT EXTRACTION PHACO AND INTRAOCULAR               LENS PLACEMENT ([facility]);  Surgeon: Leandrew Koyanagi, MD;              Location: [facility] ORS;  Service: Ophthalmology;   Laterality:              Left;  [place] 01:04 AP% 21.3 CDE 13.66 Fluid pack lot #               [id number] 03/25/2018: COLONOSCOPY WITH PROPOFOL; N/A     Comment:  Procedure: COLONOSCOPY WITH PROPOFOL;  Surgeon: Lin Landsman, MD;  Location: [facility] ENDOSCOPY;  Service:               Gastroenterology;  Laterality: N/A; No date: DENTAL SURGERY     Comment:  EXTRACTIONS 02/10/2018: ESOPHAGOGASTRODUODENOSCOPY (EGD) WITH PROPOFOL; N/A     Comment:  Procedure: ESOPHAGOGASTRODUODENOSCOPY (EGD) WITH               PROPOFOL;  Surgeon: Jonathon Bellows, MD;  Location: [facility]               ENDOSCOPY;  Service: Gastroenterology;  Laterality: N/A; 03/25/2018: ESOPHAGOGASTRODUODENOSCOPY (EGD) WITH PROPOFOL; N/A     Comment:  Procedure: ESOPHAGOGASTRODUODENOSCOPY (EGD) WITH  PROPOFOL;  Surgeon: Lin Landsman, MD;  Location:               [facility] ENDOSCOPY;  Service: Gastroenterology;  Laterality:               N/A; 04/22/2018: ESOPHAGOGASTRODUODENOSCOPY (EGD) WITH PROPOFOL; N/A     Comment:  Procedure: ESOPHAGOGASTRODUODENOSCOPY (EGD) WITH               PROPOFOL with band ligation;  Surgeon: Lin Landsman, MD;  Location: [place];  Service:               Gastroenterology;  Laterality: N/A; 06/24/2018: ESOPHAGOGASTRODUODENOSCOPY (EGD) WITH PROPOFOL; N/A     Comment:  Procedure: ESOPHAGOGASTRODUODENOSCOPY (EGD) WITH               PROPOFOL;  Surgeon: Lin Landsman, MD;  Location:               [facility] ENDOSCOPY;  Service: Gastroenterology;  Laterality:               N/A; No date: EYE SURGERY 03/25/2018: HEMORRHOID BANDING     Comment:  Procedure: HEMORRHOID BANDING;  Surgeon: Lin Landsman, MD;  Location: [facility] ENDOSCOPY;  Service:               Gastroenterology;;  BMI    Body Mass Index:  31.71 kg/m      Reproductive/Obstetrics negative OB ROS                             Anesthesia Physical  Anesthesia  Plan  ASA: IV  Anesthesia Plan: General   Post-op Pain Management:    Induction: Intravenous  PONV Risk Score and Plan: 3 and Propofol infusion and TIVA  Airway Management Planned: Natural Airway and Nasal Cannula  Additional Equipment:   Intra-op Plan:   Post-operative Plan:   Informed Consent: I have reviewed the patients History and Physical, chart, labs and discussed the procedure including the risks, benefits and alternatives for the proposed anesthesia with the patient or authorized representative who has indicated his/her understanding and acceptance.   Dental Advisory Given  Plan Discussed with: Anesthesiologist, CRNA and Surgeon  Anesthesia Plan Comments: (Per Dr. Marius Ditch, last vomiting episode was 11:30pm last night, low suspicion for active bleeding in the stomach at this time.  No ascites.  Dr. Marius Ditch to take a quick look and will reassess need for intubation at that time.)        Anesthesia Quick Evaluation

## 2018-11-19 NOTE — Anesthesia Postprocedure Evaluation (Signed)
Anesthesia Post Note  Patient: Crystal [name]  Procedure(s) Performed: ESOPHAGOGASTRODUODENOSCOPY (EGD) WITH PROPOFOL (N/A )  Patient location during evaluation: Endoscopy Anesthesia Type: General Level of consciousness: awake and alert Pain management: pain level controlled Vital Signs Assessment: post-procedure vital signs reviewed and stable Respiratory status: spontaneous breathing and respiratory function stable Cardiovascular status: stable Anesthetic complications: no     Last Vitals:  Vitals:   11/19/18 1712 11/19/18 1720  BP: 135/61 137/63  Pulse: 72 78  Resp: 16 16  Temp: 36.6 C   SpO2:      Last Pain:  Vitals:   11/19/18 1720  TempSrc:   PainSc: 0-No pain                 KEPHART,WILLIAM K

## 2018-11-20 ENCOUNTER — Other Ambulatory Visit: Payer: Self-pay

## 2018-11-20 ENCOUNTER — Inpatient Hospital Stay: Payer: Medicare HMO | Attending: Hematology and Oncology | Admitting: Hematology and Oncology

## 2018-11-20 ENCOUNTER — Inpatient Hospital Stay: Payer: Medicare HMO

## 2018-11-20 ENCOUNTER — Other Ambulatory Visit: Payer: Self-pay | Admitting: Hematology and Oncology

## 2018-11-20 ENCOUNTER — Encounter: Payer: Self-pay | Admitting: Gastroenterology

## 2018-11-20 VITALS — BP 145/67 | HR 63 | Temp 97.7°F | Resp 18 | Wt 186.3 lb

## 2018-11-20 DIAGNOSIS — R918 Other nonspecific abnormal finding of lung field: Secondary | ICD-10-CM

## 2018-11-20 DIAGNOSIS — E538 Deficiency of other specified B group vitamins: Secondary | ICD-10-CM

## 2018-11-20 DIAGNOSIS — D509 Iron deficiency anemia, unspecified: Secondary | ICD-10-CM | POA: Insufficient documentation

## 2018-11-20 DIAGNOSIS — R188 Other ascites: Secondary | ICD-10-CM

## 2018-11-20 DIAGNOSIS — D500 Iron deficiency anemia secondary to blood loss (chronic): Secondary | ICD-10-CM

## 2018-11-20 DIAGNOSIS — K5520 Angiodysplasia of colon without hemorrhage: Secondary | ICD-10-CM | POA: Diagnosis not present

## 2018-11-20 DIAGNOSIS — R911 Solitary pulmonary nodule: Secondary | ICD-10-CM

## 2018-11-20 DIAGNOSIS — K7460 Unspecified cirrhosis of liver: Secondary | ICD-10-CM

## 2018-11-20 LAB — COMPREHENSIVE METABOLIC PANEL
ALT: 43 U/L (ref 0–44)
AST: 92 U/L — ABNORMAL HIGH (ref 15–41)
Albumin: 2.8 g/dL — ABNORMAL LOW (ref 3.5–5.0)
Alkaline Phosphatase: 212 U/L — ABNORMAL HIGH (ref 38–126)
Anion gap: 10 (ref 5–15)
BUN: 9 mg/dL (ref 8–23)
CO2: 21 mmol/L — ABNORMAL LOW (ref 22–32)
Calcium: 8.7 mg/dL — ABNORMAL LOW (ref 8.9–10.3)
Chloride: 107 mmol/L (ref 98–111)
Creatinine, Ser: 0.66 mg/dL (ref 0.44–1.00)
GFR calc Af Amer: >60 mL/min (ref >60)
GFR calc non Af Amer: >60 mL/min (ref >60)
Glucose, Bld: 178 mg/dL — ABNORMAL HIGH (ref 70–99)
Potassium: 3.5 mmol/L (ref 3.5–5.1)
Sodium: 138 mmol/L (ref 135–145)
Total Bilirubin: 1.5 mg/dL — ABNORMAL HIGH (ref 0.3–1.2)
Total Protein: 6.8 g/dL (ref 6.5–8.1)

## 2018-11-20 LAB — CBC WITH DIFFERENTIAL/PLATELET
Abs Immature Granulocytes: 0.01 10*3/uL (ref 0.00–0.07)
Basophils Absolute: 0 10*3/uL (ref 0.0–0.1)
Basophils Relative: 1 %
Eosinophils Absolute: 0.1 10*3/uL (ref 0.0–0.5)
Eosinophils Relative: 4 %
HCT: 39.9 % (ref 36.0–46.0)
Hemoglobin: 12.6 g/dL (ref 12.0–15.0)
Immature Granulocytes: 0 %
Lymphocytes Relative: 24 %
Lymphs Abs: 0.9 10*3/uL (ref 0.7–4.0)
MCH: 27.5 pg (ref 26.0–34.0)
MCHC: 31.6 g/dL (ref 30.0–36.0)
MCV: 87.1 fL (ref 80.0–100.0)
Monocytes Absolute: 0.4 10*3/uL (ref 0.1–1.0)
Monocytes Relative: 10 %
Neutro Abs: 2.4 10*3/uL (ref 1.7–7.7)
Neutrophils Relative %: 61 %
Platelets: 105 10*3/uL — ABNORMAL LOW (ref 150–400)
RBC: 4.58 MIL/uL (ref 3.87–5.11)
RDW: 17.2 % — ABNORMAL HIGH (ref 11.5–15.5)
WBC: 3.9 10*3/uL — ABNORMAL LOW (ref 4.0–10.5)
nRBC: 0 % (ref 0.0–0.2)

## 2018-11-20 LAB — FOLATE: Folate: 26 ng/mL (ref >5.9)

## 2018-11-20 LAB — FERRITIN: Ferritin: 63 ng/mL (ref 11–307)

## 2018-11-20 MED ORDER — CYANOCOBALAMIN 1000 MCG/ML IJ SOLN
1000.0000 ug | Freq: Once | INTRAMUSCULAR | Status: AC
  Administered 2018-11-20: 1000 ug via INTRAMUSCULAR

## 2018-11-20 NOTE — Progress Notes (Signed)
[facility] day:  11/20/2018  Chief Complaint: Crystal [name] is a 68 y.o. female with 2 right sided pulmonary nodules and lymphadenopathy who seen for 3 month assessment.  HPI:  The patient was last seen in the medical oncology clinic on 08/21/2018.  At that time, she was doing well.  She was recovering well from her recent TIPS procedure.  Patient complained of exertional shortness of breath.  Exam revealed mild ascites and lower extremity edema.  She had stigmata of liver disease.  She was encouraged to follow-up with pulmonary medicine.  Abdomen ultrasound with doppler on 09/03/2018 revealed a patent TIPS graft with no evidence of ascites or persistent varices.  She was seen by Dr. Marius Ditch on 09/11/2018.  Notes reviewed.  She denied any bleeding.  She was taking lactulose 30 ml BID, spironolactone 100 mg daily, and Protonix twice daily.  She was to continue rifaximin 550 mg twice daily, long-term.  CBC on 09/25/2018 revealed a hematocrit of 36.2, hemoglobin 11.5, MCV 85.8, platelets 131,000, WBC 3500 with an ANC of 1600.  EGD on 11/19/2018 revealed no esophageal varices.  There was a normal duodenal bulb and second portion of the duodenum.  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  She has continued monthly B12 (last 10/23/2018).  During the interim, patient remains fatigued. She denies any shortness of breath or chest pain. Patient denies bleeding; no hematochezia, melena, or gross hematuria. She has chronic lower extremity edema, for which she takes daily diuretics. Patient continues to have loose stools due to the "shit medicine" (rifaximin). Patient denies that she has experienced any B symptoms. She denies any interval infections.   Patient advises that she maintains an adequate appetite. She is eating well. Weight today is 186 lb 4.6 oz (84.5 kg), which compared to her last  visit to the clinic, represents a 17 pound increase.    Patient denies pain in the clinic today.   Past Medical History:  Diagnosis Date  . Acute GI bleeding 02/09/2018  . Acute upper gastrointestinal bleeding 08/03/2018  . Anemia    TAKES IRON TAB  . Arthritis    SHOULDER  . B12 deficiency 02/18/2018  . Bleeding    GI  3/19  . Bronchitis    HX OF  . Cirrhosis ([place])   . Colon cancer screening   . Diabetes mellitus without complication ([place])    TYPE 2  . Fatty (change of) liver, not elsewhere classified   . Full dentures    UPPER AND LOWER  . Iron deficiency anemia due to chronic blood loss 02/18/2018  . Lung nodule, multiple   . Pernicious anemia 02/22/2018  . PUD (peptic ulcer disease)   . Upper GI bleeding 08/03/2018    Past Surgical History:  Procedure Laterality Date  . CATARACT EXTRACTION W/PHACO Right 02/06/2018   Procedure: CATARACT EXTRACTION PHACO AND INTRAOCULAR LENS PLACEMENT ([place]) RIGHT DIABETIC;  Surgeon: Leandrew Koyanagi, MD;  Location: [facility];  Service: Ophthalmology;  Laterality: Right;  DIABETIC, ORAL MED  . CATARACT EXTRACTION W/PHACO Left 04/30/2018   Procedure: CATARACT EXTRACTION PHACO AND INTRAOCULAR LENS PLACEMENT ([facility]);  Surgeon: Leandrew Koyanagi, MD;  Location: [facility];  Service: Ophthalmology;  Laterality: Left;  [place] 01:04 AP% 21.3 CDE 13.66 Fluid pack lot # [id number]  . COLONOSCOPY WITH PROPOFOL N/A 03/25/2018   Procedure: COLONOSCOPY WITH PROPOFOL;  Surgeon: Lin Landsman, MD;  Location: [facility]  ENDOSCOPY;  Service: Gastroenterology;  Laterality: N/A;  . DENTAL SURGERY     EXTRACTIONS  . ESOPHAGOGASTRODUODENOSCOPY N/A 08/03/2018   Procedure: ESOPHAGOGASTRODUODENOSCOPY (EGD);  Surgeon: Lin Landsman, MD;  Location: [facility] ENDOSCOPY;  Service: Gastroenterology;  Laterality: N/A;  . ESOPHAGOGASTRODUODENOSCOPY (EGD) WITH PROPOFOL N/A 02/10/2018   Procedure: ESOPHAGOGASTRODUODENOSCOPY (EGD) WITH PROPOFOL;  Surgeon: Jonathon Bellows, MD;   Location: [facility] ENDOSCOPY;  Service: Gastroenterology;  Laterality: N/A;  . ESOPHAGOGASTRODUODENOSCOPY (EGD) WITH PROPOFOL N/A 03/25/2018   Procedure: ESOPHAGOGASTRODUODENOSCOPY (EGD) WITH PROPOFOL;  Surgeon: Lin Landsman, MD;  Location: [facility] ENDOSCOPY;  Service: Gastroenterology;  Laterality: N/A;  . ESOPHAGOGASTRODUODENOSCOPY (EGD) WITH PROPOFOL N/A 04/22/2018   Procedure: ESOPHAGOGASTRODUODENOSCOPY (EGD) WITH PROPOFOL with band ligation;  Surgeon: Lin Landsman, MD;  Location: [place];  Service: Gastroenterology;  Laterality: N/A;  . ESOPHAGOGASTRODUODENOSCOPY (EGD) WITH PROPOFOL N/A 06/24/2018   Procedure: ESOPHAGOGASTRODUODENOSCOPY (EGD) WITH PROPOFOL;  Surgeon: Lin Landsman, MD;  Location: [facility] ENDOSCOPY;  Service: Gastroenterology;  Laterality: N/A;  . ESOPHAGOGASTRODUODENOSCOPY (EGD) WITH PROPOFOL N/A 11/19/2018   Procedure: ESOPHAGOGASTRODUODENOSCOPY (EGD) WITH PROPOFOL;  Surgeon: Lin Landsman, MD;  Location: [facility] ENDOSCOPY;  Service: Gastroenterology;  Laterality: N/A;  . EYE SURGERY    . HEMORRHOID BANDING  03/25/2018   Procedure: HEMORRHOID BANDING;  Surgeon: Lin Landsman, MD;  Location: [facility] ENDOSCOPY;  Service: Gastroenterology;;  . IR EMBO ART  VEN HEMORR LYMPH EXTRAV  INC GUIDE ROADMAPPING  08/07/2018  . IR RADIOLOGIST EVAL & MGMT  09/03/2018  . IR TIPS  08/07/2018  . RADIOLOGY WITH ANESTHESIA N/A 08/07/2018   Procedure: TIPS;  Surgeon: Corrie Mckusick, DO;  Location: [place];  Service: Anesthesiology;  Laterality: N/A;    Family History  Problem Relation Age of Onset  . CAD Father   . Cancer Maternal Uncle     Social History:  reports that she has never smoked. She has never used smokeless tobacco. She reports that she does not drink alcohol or use drugs.  Patient denies known exposures to radiation on toxins. She retired from [employer] in 11/2017.  The patient is alone today.  Allergies: No Known Allergies  Current Medications: Current  Outpatient Medications  Medication Sig Dispense Refill  . cyanocobalamin (,VITAMIN B-12,) 1000 MCG/ML injection Inject 1,000 mcg into the muscle once.    . furosemide (LASIX) 20 MG tablet Take 40 mg by mouth daily.     Marland Kitchen lactulose (CHRONULAC) 10 GM/15ML solution TAKE 30 MLS (20 G TOTAL) BY MOUTH 2 (TWO) TIMES DAILY. 1800 mL 2  . metFORMIN (GLUCOPHAGE) 500 MG tablet Take 1,000 mg by mouth 2 (two) times daily with a meal.     . pantoprazole (PROTONIX) 40 MG tablet Take 1 tablet (40 mg total) by mouth 2 (two) times daily. 60 tablet 0  . pantoprazole (PROTONIX) 40 MG tablet Take 40 mg by mouth 2 (two) times daily.  5  . rifaximin (XIFAXAN) 550 MG TABS tablet Take 1 tablet (550 mg total) by mouth 2 (two) times daily. 180 tablet 1  . spironolactone (ALDACTONE) 50 MG tablet Take 2 tablets (100 mg total) by mouth daily. (Patient taking differently: Take 50 mg by mouth daily. ) 180 tablet 1  . fluconazole (DIFLUCAN) 150 MG tablet TAKE 1 TABLET NOW, IF NO IMPROVEMENT REPEAT IN 1 WEEK  1  . neomycin-polymyxin-hydrocortisone (CORTISPORIN) OTIC solution Apply 1-2 drops to toe after soaking twice a day (Patient not taking: Reported on 11/20/2018) 10 mL 0  . nystatin (MYCOSTATIN/NYSTOP) powder Apply 1 g topically  as needed (rash).     . ondansetron (ZOFRAN) 4 MG tablet Take 1 tablet (4 mg total) by mouth every 6 (six) hours as needed for nausea. (Patient not taking: Reported on 11/20/2018) 20 tablet 0  . ONE TOUCH ULTRA TEST test strip 2 (two) times daily.  5  . ONETOUCH DELICA LANCETS 03E MISC 2 (two) times daily.  5   No current facility-administered medications for this visit.     Review of Systems  Constitutional: Negative for chills, diaphoresis, fever, malaise/fatigue and weight loss (up 17 pounds).       Doing "ok".  Tires easily.  HENT: Negative.  Negative for congestion, ear discharge, ear pain, nosebleeds, sinus pain and sore throat.   Eyes: Negative.  Negative for blurred vision, double vision,  photophobia and pain.       Cataract surgery to left eye on 04/30/2018.  Respiratory: Negative.  Negative for cough, hemoptysis, sputum production and shortness of breath.   Cardiovascular: Positive for leg swelling. Negative for chest pain, palpitations, orthopnea and PND.  Gastrointestinal: Negative for abdominal pain, blood in stool, constipation, diarrhea (rifaximin causes loose stools), heartburn, melena, nausea and vomiting.       Known portal hypertensive gastropathy.  s/p variceal ligation.  Genitourinary: Negative.  Negative for dysuria, frequency, hematuria and urgency.  Musculoskeletal: Negative.  Negative for back pain, falls, joint pain, myalgias and neck pain.  Skin: Negative.  Negative for itching and rash.  Neurological: Negative.  Negative for dizziness, tremors, sensory change, speech change, focal weakness, weakness and headaches.  Endo/Heme/Allergies: Does not bruise/bleed easily.       Diabetes - on metformin.  Psychiatric/Behavioral: Negative.  Negative for depression and memory loss. The patient is not nervous/anxious and does not have insomnia.   All other systems reviewed and are negative.  Performance status (ECOG): 1  Vital Signs BP (!) 145/67 (BP Location: Left Arm, Patient Position: Sitting)   Pulse 63   Temp 97.7 F (36.5 C) (Tympanic)   Resp 18   Wt 186 lb 4.6 oz (84.5 kg)   LMP  (LMP Unknown)   BMI 33.00 kg/m   Physical Exam  Constitutional: She is oriented to person, place, and time and well-developed, well-nourished, and in no distress. No distress.  HENT:  Head: Normocephalic and atraumatic.  Mouth/Throat: Oropharynx is clear and moist and mucous membranes are normal. No oropharyngeal exudate.  Wearing a crochet hat.  Gray hair.  Eyes: Pupils are equal, round, and reactive to light. Conjunctivae and EOM are normal. No scleral icterus.  Glasses.  Blue eyes.  Neck: Normal range of motion. Neck supple. No JVD present.  Cardiovascular: Normal rate,  regular rhythm, normal heart sounds and intact distal pulses. Exam reveals no gallop and no friction rub.  No murmur heard. Pulmonary/Chest: Effort normal and breath sounds normal. No respiratory distress. She has no wheezes. She has no rales.  Abdominal: Soft. Bowel sounds are normal. She exhibits no distension and no mass. There is no abdominal tenderness.  Musculoskeletal: Normal range of motion.        General: Edema (chronic lower extremity) present. No tenderness.     Comments: Hands purple in color (? Raynaud's syndrome).  Lymphadenopathy:    She has no cervical adenopathy.    She has no axillary adenopathy.       Right: No inguinal and no supraclavicular adenopathy present.       Left: No inguinal and no supraclavicular adenopathy present.  Neurological: She is alert and  oriented to person, place, and time.  Skin: Skin is warm and dry. No rash noted. She is not diaphoretic. No erythema. No pallor.  Telangectasias lip and face.  Psychiatric: Mood, affect and judgment normal.  Nursing note and vitals reviewed.   Admission on 11/19/2018, Discharged on 11/19/2018  Component Date Value Ref Range Status  . Glucose-Capillary 11/19/2018 53* 70 - 99 mg/dL Final  . Glucose-Capillary 11/19/2018 67* 70 - 99 mg/dL Final  . Glucose-Capillary 11/19/2018 111* 70 - 99 mg/dL Final  . Comment 1 11/19/2018 Document in Chart   Final  . Glucose-Capillary 11/19/2018 100* 70 - 99 mg/dL Final    Assessment:  Crystal [name] is a 68 y.o. female with a right lower lobe pulmonary nodule.  She denies any smoking history.  Abdomen and pelvic CT on 02/09/2018 revealed an 1.8 cm spiculated density in right lower lobe concerning for malignancy. There was mildly nodular hepatic contour concerning for hepatic cirrhosis.  There was moderate splenomegaly suggesting portal venous hypertension.  Mild ascites was noted.  There were mildly enlarged retroperitoneal lymph nodes (largest 1.1 cm) concerning for possible  metastatic disease or malignancy.  There was a 7 cm left ovarian cyst.  Further evaluation with MRI was recommended to evaluate for possible neoplasm.  CA125 was 351.3 and CEA 1.1 on 02/21/2018.   Chest CT on 02/25/2018 revealed a 2.3 x 1.5 x 1.0 cm perifissural nodule with spiculated margins in the right middle lobe.  There was a 1.5 x 1.5 x 1.4 cm right lower lobe spiculated nodule.  There was a moderate left sided pleural effusion.    PET scan on 03/08/2018 revealed a 2.5 x 1.8 cm sub solid nodular opacity in the RML (SUV 2.7) and a 1.6 cm rounded slightly spiculated RLL pulmonary nodule (SUV 2.1).  There were no enlarged or hypermetabolic mediastinal or hilar lymph nodes.  There was a small left pleural effusion.  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  Chest CT on 05/15/2018 revealed no significant changes in the 2 previously noted nodules in the RIGHT lung. Irregular RML lesion measures 2.4 x 1.4 cm (previously 2.3 x 1.5 cm). Spiculated central RLL lesion measures 1.8 x 1.4 cm (previously 1.6 x 1.5 cm). Both areas remain concerning for neoplasm. There was interval decrease in ascites volume overall. Upper abdominal lymphadenopathy noted that was felt to be reactive due to underlying liver disease.    Chest CT on 08/15/2018 revealed the spiculated right lower lobe lung lesion appeared slightly increased and was not significantly changed in size compared with previous exam.  Morphologically, this was worrisome for a small bronchogenic neoplasm.  The right middle lobe lung nodule was slightly decreased in size in the interval and may be post infectious or inflammatory in etiology.  She was admitted to [facility] from 02/09/2018 - 02/12/2018 with a variceal hemorrhage.  EGD on 02/10/2018 revealed 5 columns of grade III esophageal varices in the lower third of the esophagus.  There was stigmata of recent bleeding and red  wale signs.  She underwent variceal ligation x 10.   She required 3 units of PRBCs.  EGD on 03/25/2018 revealed 2 angiectasias (non-bleeding) in the second portion of the duodenum, and a few diminutive (non-bleeding) angiectasias in the prepyloric region of the stomach that were treated APC. There were 4 non-bleeding ulcers (Forrest Class III)  found in the gastric fundus. There were 4 columns of large non-bleeding varices in the lower  third of the esophagus, of which demonstrated no stigmata of bleeding.  Varices were banded.  EGD on 04/22/2018  revealed one non-bleeding cratered gastric ulcer (Forrest Class III) in the lesser curvature of the gastric body. Duodenal bulb and second portion of the duodenum were normal. Moderate portal hypertensive gastropathy noted in the stomach. Large varices in the lower third of the esophagus noted. 2 bands were placed with incomplete eradication of the lesions.   Colonoscopy on 03/25/2018 revealed a single 5 mm sessile polyp in the ascending colon. Patient has rectal varices and external hemorrhoids. Pathology returned as tubular adenoma, and was negative for high grade dysplasia and malignancy.   She has iron deficiency anemia.  Ferritin was 6.  She is on ferrous sulfate 325 mg TID.  She has B12 deficiency.  B12 was 103.   Intrinsic factor antibody was negative.  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  She began B12 injections on 03/06/2018 (last 10/23/2018).  Folate was 14.3 on 02/09/2018.  B12 was 485 on 05/24/2018.  Diet is good.  She received Venofer weekly x 4 (07/12/2018 - 07/31/2018).    Ferritin has been followed: 6 on 02/09/2018, 16 on 05/24/2018, 13 on 07/10/2018, and 374 on 08/15/2018.  She has hepatitis C antibodies.  HCV RNA was not detected. She has been exposed to hepatitis B.  Anti-smooth muscle antibodies are weakly positive.  ANA was positive (> 1:1280 centromere antibody).  Negative studies included:  ceruloplasmin, anti-mitochondrial antibodies, hepatitis B IgM, HIV, and alpha-1 antitrypsin.  PT was 15.2 (INR 1.21).  AFP was 1.5 on 05/28/2018 and 2.4 on 11/20/2018.  RUQ ultrasound on 05/29/2018 revealed cholelithiasis. There was a thickened edematous gallbladder wall. The wall thickening could be due to hypoproteinemia/hypoalbuminemia or cholecystitis. Negative sonographic Murphy's sign.  There was a heterogeneous slightly nodular liver consistent with cirrhosis.  Pelvic ultrasound  on 03/18/2018  demonstrated a simple cyst in the LEFT ovary measuring 6.1 x 4.5 x 6.6 cm. Repeat imaging recommended in 1 year to document stability.   Patient underwent cataract surgery on her LEFT eye on 04/30/2018.  Symptomatically, she feels "ok".  She tires easily.  Exam reveals chronic lower extremity edema and stigmata of liver disease.  Plan: 1. Labs today:  CBC with diff, CMP, ferritin, folate, AFP. 2. Decompensated cirrhosis s/p TIPS Patient doing well s/p TIPS procedure.   No recurrent GI bleeding. Continue close follow-up with GI. Check AFP every 6 months. 3. Pulmonary nodules Review chest CT from 08/15/2018.  Concern for RLL lesion Lung nodule investigation delayed secondary to GI issues, now well managed. Discuss repeating chest CT.  Patient in agreement.  Discuss follow-up with pulmonary medicine Mortimer Fries, MD) for consideration of bronchoscopy.   4. Iron deficiency anemia Hematocrit 39.9.  Hemoglobin 12.6.  MCV 87.1 Ferritin 63. No IV iron today. 5. B12 deficiency Patient receives monthly B12 (last 10/23/2018).  B12 today and monthly x 3 6.   RTC in 3 months for labs (CBC with diff, ferritin). 7.   RTC in 6 months for MD assessment, labs (CBC with diff, ferritin, AFP- day before), B12, +/- Venofer.   Honor Loh, NP  11/20/2018, 10:34 AM   I saw and evaluated the patient, participating in the [place] portions of the service and reviewing pertinent diagnostic studies and records.  I reviewed  the nurse practitioner's note and agree with the findings and the plan.  The assessment and plan were discussed with the patient.  Multiple questions were asked by  the patient and answered.   Nolon Stalls, MD 11/20/2018, 10:34 AM

## 2018-11-20 NOTE — Progress Notes (Signed)
Here for follow up. Per pt " doing ok"

## 2018-11-22 LAB — AFP TUMOR MARKER: AFP, Serum, Tumor Marker: 2.4 ng/mL (ref 0.0–8.3)

## 2018-12-01 ENCOUNTER — Other Ambulatory Visit: Payer: Self-pay | Admitting: Gastroenterology

## 2018-12-04 ENCOUNTER — Ambulatory Visit (INDEPENDENT_AMBULATORY_CARE_PROVIDER_SITE_OTHER): Payer: Medicare HMO | Admitting: Internal Medicine

## 2018-12-04 ENCOUNTER — Encounter: Payer: Self-pay | Admitting: Internal Medicine

## 2018-12-04 VITALS — BP 136/62 | HR 67 | Resp 16 | Ht 63.0 in | Wt 188.0 lb

## 2018-12-04 DIAGNOSIS — R9389 Abnormal findings on diagnostic imaging of other specified body structures: Secondary | ICD-10-CM | POA: Diagnosis not present

## 2018-12-04 NOTE — Patient Instructions (Signed)
Follow up CT chest pending

## 2018-12-04 NOTE — Progress Notes (Signed)
Name: Crystal [name] MRN: [id number] DOB: 1950-12-07     CONSULTATION DATE: 12/04/2018 REFERRING MD : Pearline Cables  CHIEF COMPLAINT: abnormal CT chest  STUDIES:     CXR independently reviewed by Me    02/2018 CT chest Independently reviewed by Me Right middle lobe opacity 23 x 14 mm  right lower lobe opacity 15 x 14 mm  05/2018 CT chest independently reviewed by me Right middle lobe opacity 23 x 14 mm  right lower lobe opacity 14 x 17 mm  08/2018 CT chest independently reviewed by me Right middle lobe opacity 20 x 14 mm  right lower lobe opacity 18 x 15 mm  HISTORY OF PRESENT ILLNESS: 68 year old pleasant white female seen today for abnormal CT chest Patient has a history of liver cirrhosis with variceal bleeding She had a CT of the abdomen and pelvis in March 2019 which incidentally found a lower lobe nodule  Since then patient has had 3 CT scans of her chest which shows a right middle lobe opacification as well as a right lower lobe opacification which have not significantly grown in the last 8 months  Patient also had a PET scan but these opacifications were weakly positive  Patient has a history history of liver cirrhosis with GI bleed status post TIPS liver cirrhosis from [facility]  Patient does not have any type of respiratory symptoms at this time No evidence of respiratory issues No cough no shortness of breath no dyspnea on exertion No fevers no chills No nausea no vomiting no diarrhea  Patient is a non-smoker No history of secondhand smoke exposure     PAST MEDICAL HISTORY :   has a past medical history of Acute GI bleeding (02/09/2018), Acute upper gastrointestinal bleeding (08/03/2018), Anemia, Arthritis, B12 deficiency (02/18/2018), Bleeding, Bronchitis, Cirrhosis ([place]), Colon cancer screening, Diabetes mellitus without complication ([place]), Fatty (change of) liver, not elsewhere classified, Full dentures, Iron deficiency anemia due to chronic blood loss (02/18/2018), Lung  nodule, multiple, Pernicious anemia (02/22/2018), PUD (peptic ulcer disease), and Upper GI bleeding (08/03/2018).  has a past surgical history that includes Dental surgery; Cataract extraction w/PHACO (Right, 02/06/2018); Esophagogastroduodenoscopy (egd) with propofol (N/A, 02/10/2018); Eye surgery; Esophagogastroduodenoscopy (egd) with propofol (N/A, 03/25/2018); Colonoscopy with propofol (N/A, 03/25/2018); Hemorrhoid banding (03/25/2018); Esophagogastroduodenoscopy (egd) with propofol (N/A, 04/22/2018); Cataract extraction w/PHACO (Left, 04/30/2018); Esophagogastroduodenoscopy (egd) with propofol (N/A, 06/24/2018); Esophagogastroduodenoscopy (N/A, 08/03/2018); IR EMBO ART  VEN HEMORR LYMPH EXTRAV  INC GUIDE ROADMAPPING (08/07/2018); IR Tips (08/07/2018); Radiology with anesthesia (N/A, 08/07/2018); IR Radiologist Eval & Mgmt (09/03/2018); and Esophagogastroduodenoscopy (egd) with propofol (N/A, 11/19/2018). Prior to Admission medications   Medication Sig Start Date End Date Taking? Authorizing Provider  cyanocobalamin (,VITAMIN B-12,) 1000 MCG/ML injection Inject 1,000 mcg into the muscle once.   Yes [provider]  fluconazole (DIFLUCAN) 150 MG tablet TAKE 1 TABLET NOW, IF NO IMPROVEMENT REPEAT IN 1 WEEK 09/20/18  Yes [provider]  furosemide (LASIX) 20 MG tablet Take 40 mg by mouth daily.  09/10/18  Yes [provider]  lactulose (CHRONULAC) 10 GM/15ML solution TAKE 30 MLS (20 G TOTAL) BY MOUTH 2 (TWO) TIMES DAILY. 11/14/18 02/12/19 Yes Vanga, Tally Due, MD  metFORMIN (GLUCOPHAGE) 500 MG tablet Take 1,000 mg by mouth 2 (two) times daily with a meal.    Yes [provider]  nystatin (MYCOSTATIN/NYSTOP) powder Apply 1 g topically as needed (rash).  05/23/18  Yes [provider]  ondansetron (ZOFRAN) 4 MG tablet Take 1 tablet (4 mg total)  by mouth every 6 (six) hours as needed for nausea. 08/03/18  Yes Rosine Door, MD  ONE TOUCH ULTRA TEST test strip 2 (two) times daily.  09/13/18  Yes [provider]  Jonetta Speak LANCETS 49S MISC 2 (two) times daily. 09/13/18  Yes [provider]  pantoprazole (PROTONIX) 40 MG tablet Take 40 mg by mouth 2 (two) times daily. 10/05/18  Yes [provider]  rifaximin (XIFAXAN) 550 MG TABS tablet Take 1 tablet (550 mg total) by mouth 2 (two) times daily. 08/27/18 02/23/19 Yes Vanga, Tally Due, MD  pantoprazole (PROTONIX) 40 MG tablet Take 1 tablet (40 mg total) by mouth 2 (two) times daily. 08/10/18 11/20/18  Arrien, Jimmy Picket, MD  spironolactone (ALDACTONE) 50 MG tablet Take 2 tablets (100 mg total) by mouth daily. Patient taking differently: Take 50 mg by mouth daily.  05/24/18 11/20/18  Lin Landsman, MD   No Known Allergies  FAMILY HISTORY:  family history includes CAD in her father; Cancer in her maternal uncle. SOCIAL HISTORY:  reports that she has never smoked. She has never used smokeless tobacco. She reports that she does not drink alcohol or use drugs.  REVIEW OF SYSTEMS:   Constitutional: Negative for fever, chills, weight loss, malaise/fatigue and diaphoresis.  HENT: Negative for hearing loss, ear pain, nosebleeds, congestion, sore throat, neck pain, tinnitus and ear discharge.   Eyes: Negative for blurred vision, double vision, photophobia, pain, discharge and redness.  Respiratory: Negative for cough, hemoptysis, sputum production, shortness of breath, wheezing and stridor.   Cardiovascular: Negative for chest pain, palpitations, orthopnea, claudication, +leg swelling Gastrointestinal: Negative for heartburn, nausea, vomiting, abdominal pain, diarrhea, constipation, blood in stool and melena.  Genitourinary: Negative for dysuria, urgency, frequency, hematuria and flank pain.  Musculoskeletal: Negative for myalgias, back pain, joint pain and falls.  Skin: Negative for itching and rash.  Neurological: Negative for dizziness, tingling, tremors, sensory change, speech change, focal  weakness, seizures, loss of consciousness, weakness and headaches.  Endo/Heme/Allergies: Negative for environmental allergies and polydipsia. Does not bruise/bleed easily.  ALL OTHER ROS ARE NEGATIVE   BP 136/62 (BP Location: Left Arm, Cuff Size: Large)   Pulse 67   Resp 16   Ht 5' 3" (1.6 m)   Wt 188 lb (85.3 kg)   LMP  (LMP Unknown)   SpO2 100%   BMI 33.30 kg/m   Physical Examination:   GENERAL:NAD, no fevers, chills, no weakness no fatigue HEAD: Normocephalic, atraumatic.  EYES: Pupils equal, round, reactive to light. Extraocular muscles intact. No scleral icterus.  MOUTH: Moist mucosal membrane.   EAR, NOSE, THROAT: Clear without exudates. No external lesions.  NECK: Supple. No thyromegaly. No nodules. No JVD.  PULMONARY:CTA B/L no wheezes, no crackles, no rhonchi CARDIOVASCULAR: S1 and S2. Regular rate and rhythm. No murmurs, rubs, or gallops. No edema.  GASTROINTESTINAL: Soft, nontender, nondistended. No masses. Positive bowel sounds.  MUSCULOSKELETAL: +swelling, clubbing, +edema. Range of motion full in all extremities.  NEUROLOGIC: Cranial nerves II through XII are intact. No gross focal neurological deficits.  SKIN: +spider angiomas face and chest PSYCHIATRIC: Mood, affect within normal limits. The patient is awake, alert and oriented x 3. Insight, judgment intact.      ASSESSMENT / PLAN:  68 year old pleasant white female seen today for abnormal CT chest of the right middle lobe opacification and lung nodule along with the right lower lobe opacification of the lung nodule that have not significantly grown in size of the last 9 months  Patient  has a repeat CT chest pending in the next month or so Patient does not have any significant respiratory issues at this time however she does have severe liver cirrhosis.  I have advised that we wait for repeat CT chest and assess plan of action with bronchoscopy She will be at high risk for postop bleeding due to her  underlying liver cirrhosis and low platelet count    Patient satisfied with Plan of action and management. All questions answered  Corrin Parker, M.D.  [facility]  Medical Director [place] Director [facility] Cardio-Pulmonary Department

## 2018-12-09 ENCOUNTER — Other Ambulatory Visit: Payer: Self-pay | Admitting: Gastroenterology

## 2018-12-09 DIAGNOSIS — K7581 Nonalcoholic steatohepatitis (NASH): Secondary | ICD-10-CM

## 2018-12-09 DIAGNOSIS — K7460 Unspecified cirrhosis of liver: Secondary | ICD-10-CM

## 2018-12-09 DIAGNOSIS — R188 Other ascites: Secondary | ICD-10-CM

## 2018-12-09 DIAGNOSIS — I8510 Secondary esophageal varices without bleeding: Secondary | ICD-10-CM

## 2018-12-09 DIAGNOSIS — D500 Iron deficiency anemia secondary to blood loss (chronic): Secondary | ICD-10-CM

## 2018-12-09 DIAGNOSIS — I8511 Secondary esophageal varices with bleeding: Secondary | ICD-10-CM

## 2018-12-11 ENCOUNTER — Encounter: Payer: Self-pay | Admitting: Gastroenterology

## 2018-12-11 ENCOUNTER — Ambulatory Visit (INDEPENDENT_AMBULATORY_CARE_PROVIDER_SITE_OTHER): Payer: Medicare HMO | Admitting: Gastroenterology

## 2018-12-11 VITALS — BP 124/73 | HR 71 | Resp 17 | Ht 63.0 in | Wt 187.2 lb

## 2018-12-11 DIAGNOSIS — R2243 Localized swelling, mass and lump, lower limb, bilateral: Secondary | ICD-10-CM | POA: Diagnosis not present

## 2018-12-11 DIAGNOSIS — Z95828 Presence of other vascular implants and grafts: Secondary | ICD-10-CM

## 2018-12-11 DIAGNOSIS — K7469 Other cirrhosis of liver: Secondary | ICD-10-CM | POA: Diagnosis not present

## 2018-12-11 DIAGNOSIS — E8779 Other fluid overload: Secondary | ICD-10-CM | POA: Diagnosis not present

## 2018-12-11 DIAGNOSIS — L299 Pruritus, unspecified: Secondary | ICD-10-CM | POA: Diagnosis not present

## 2018-12-11 DIAGNOSIS — I5189 Other ill-defined heart diseases: Secondary | ICD-10-CM

## 2018-12-11 DIAGNOSIS — E8770 Fluid overload, unspecified: Secondary | ICD-10-CM | POA: Insufficient documentation

## 2018-12-11 MED ORDER — HYDROXYZINE HCL 10 MG PO TABS: 10.0000 mg | ORAL_TABLET | Freq: Three times a day (TID) | ORAL | 0 refills | Status: DC | PRN

## 2018-12-11 NOTE — Progress Notes (Signed)
Crystal Darby, MD 11 Crystal [name]  Crystal [name]  Crystal [name], [place] [name] [address]  Main: [phone]  Fax: [phone]    Gastroenterology Consultation  Referring Provider:     Lorelee Market, MD Primary Care Physician:  Crystal Market, MD Primary Gastroenterologist:  Dr. Cephas [name] Reason for Consultation:     Decompensated cirrhosis        HPI:   Crystal [name] is a 68 y.o. female referred by Dr. Lorelee Market, MD  for consultation & management of decompensated cirrhosis. Patient was recently diagnosed with decompensated cirrhosis, when she presented to [facility] on 02/09/2018 with large volume hematemesis secondary to variceal hemorrhage, underwent upper endoscopy on 02/10/2018 by Dr. Vicente [name], found to have large esophageal varices with red wale signs and underwent variceal ligation x 10. Her bleeding resolved by the time of discharge. Patient was found to have hepatitis C antibodies and HCV virus was not detected. She had other secondary liver disease workup which revealed weakly positive anti-smooth muscle antibodies. She has long-standing history of diabetes, on metformin. She has severe iron deficiency and B12 deficiency, discharged on oral iron and oral B12 supplementation. She was supposed to start nadolol but she did not get prescription filled due to high cost. Patient was just discharged from the hospital 2 days ago  Interval summary: since discharge, patient has been doing fairly well. She denies hematemesis, melena, rectal bleeding. She reports worsening of abdominal distention and swelling of legs which started prior to hospitalization. She is not on diuretics. She is taking iron and B12 supplements, omeprazole.she reports her stools are dark but formed. She wants to know if she can restart drinking Coke. She is not on a low-sodium diet. She denies history of heavy smoking or heavy alcohol use in the past. She denies easy bruising, altered  sleep cycle   Follow-up visit 02/22/2018 Patient is seen by Dr. Mike [name] on it [place] [name] for lung nodule as well as retroperitoneal lymphadenopathy. Workup is in progress. Patient is here for follow-up with regards to her decompensated cirrhosis. She is adhering to medications that I recommended during last visit. She is taking Lasix 20 mg, spironolactone 50 mg, propranolol 20 mg twice a day.She is adhering to a low-salt diet, denies swelling of legs. She has developed cellulitis in her left lower leg and is being started on antibiotic. She says her abdominal distention is improving. She denies melena, hematochezia, hematemesis. She is taking oral iron 3 pills daily and vitamin B12 daily by mouth. She reports that she has been constipated and taking Colace. She otherwise denies any complaints today. She said she had given blood samples for the labs that I ordered last week but I do not see any results. We're calling the lab to find out the status  Follow-up visit 03/15/2018 Patient is seen by Dr. Mike [name] yesterday, undergoing evaluation for lung lesion, plan is to repeat CT scan in 74month There is no concern for retroperitoneal lymphadenopathy. She is scheduled for pelvic ultrasound. She has lost about 27 pounds in last 3 weeks, mostly from aggressive diuresis as she was severely volume overloaded when he saw her in clinic on 02/22/2018. At that time, I increased her diuretics dose. She reports feeling fatigued. Reports that her stools are blackish brown as she is taking oral iron 3 pills daily along with MiraLAX. She denies hematemesis, rectal bleeding. She is adherent to low-salt diet  Follow-up visit 04/12/2018 Patient underwent upper endoscopy with variceal ligation without any  complications. She regained 12 pounds back from worsening ascites and swelling of legs. She is currently on Lasix 20 mg and spironolactone 50 mg daily,  she also decreased propranolol to once a day by herself instead of 2  times daily. She denies black stools, blood in the stools. She is taking MiraLAX once daily, having one to 2 bowel movements sometimes hard. She denies insomnia, daytime somnolence or lethargy or forgetfulness and confusion. She is undergoing eye surgery on 04/30/2018  Follow-up visit 04/19/2018 She is on furosemide 40 mg and spironolactone 100 mg daily. Her weight has is stable however, she reports she feels less distended and swelling of legs is better. She has no other complaints today.  Follow-up visit 05/24/2018 She underwent EGD since last visit which revealed large esophageal varices and 2 bands were placed. There was some oozing after attempting 3rd banding on one of the varix which stopped after hemospray. She also has antral ulcer, clean-based. She is currently on omeprazole 40 mg twice daily. she's taking furosemide 40 mg, spironolactone 100 mg, propranolol 20 mg twice daily. She is taking oral iron once daily. she is no longer on B12 replacement. She denies black stools, blood in the stools, nausea, vomiting, swelling of legs for abdominal distention  Follow-up visit 08/27/2018 Patient was admitted to [facility] secondary to severe upper GI bleed from ruptured esophageal varices on 08/03/2018.  Upper endoscopy was performed and I attempted to locate the esophageal varices.  This was unsuccessful due to extensive scarring from prior banding.  Therefore, patient was transferred to [facility] for TIPS.  Patient successfully underwent TIPS placement with coil embolization of the left gastric vein on 08/07/2018.  The bleeding was controlled.  She was started on lactulose upon discharge.  Since follow-up, patient reports that she is not seeing black stools or having hematemesis.  She feels lethargic, having trouble to balance when she walks, her speech is slow.  She said she was confused when she was in the hospital after the procedure, therefore she was started on lactulose which she is taking currently.  She is having 1-2 yellow-colored soft bowel movements daily.  She continues to take propranolol, diuretics.  Her hemoglobin after discharge was 12 with normal ferritin levels.  She denies any other GI symptoms. Came by herself today, she has not been driving.  Roommate drove her today to the office visit  Follow-up visit 09/11/2018 She followed up with Dr. [name], IR.  Underwent Dopplers and TIPS is patent. She denies swelling of legs, abdominal distention, dark stools, hematemesis.  She reports being alert and able to manage her ADLs.  She is taking lactulose 30 mL twice daily.  She is able to finish it today and asking for refill.  She has not started rifaximin yet.  She stopped propranolol and Lasix.  She continues to take spironolactone 100 mg daily, Protonix twice daily.  Follow-up visit 12/11/2018 Patient underwent EGD earlier this month and there was no evidence of esophageal or gastric varices.  Scars from prior banding has nicely healed.  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  Since her TIPS placement in 07/2018, patient has gradually gained weight, almost 20 pounds and predominantly secondary to volume overload.  She has progressively worsening swelling of legs which she did not have prior to TIPS placement.  I started her back on Lasix 40 mg and spironolactone 50 mg daily.  Her swelling of legs has remained the same.  She keeps her legs elevated during sleep  and feels lighter in the morning.  She also reports insomnia and pruritus on her face, back and abdomen worse at night.  Her most recent LFTs revealed mild elevated bilirubin and alkaline phosphatase levels have been mildly elevated chronically.  She denies black stools or any other GI complaints  She is single, lives with a roommate  NSAIDs: none including BC powder or Goody powder  Antiplts/Anticoagulants/Anti thrombotics: none  GI Procedures:  EGD 11/19/2018 - Normal duodenal bulb and second portion of the duodenum. -  Gastric antral vascular ectasia with bleeding. Treated with argon plasma coagulation (APC). - Normal gastroesophageal junction and esophagus. - No specimens collected.  EGD 08/03/2018 - Normal duodenal bulb and second portion of the duodenum. - Portal hypertensive gastropathy. - Bleeding large (> 5 mm) esophageal varices, ligation was unsuccessful. - No specimens collected.  EGD 04/22/2018 - Normal duodenal bulb and second portion of the duodenum. - Non-bleeding gastric ulcer with a clean ulcer base (Forrest Class III). - Portal hypertensive gastropathy. - Large (> 5 mm) esophageal varices. Incompletely eradicated. Banded x 2. - No specimens collected.  EGD 03/25/2018 - Two non-bleeding angioectasias in the duodenum. Treated with argon plasma coagulation (APC). - A few non-bleeding angioectasias in the stomach. Treated with argon plasma coagulation (APC). - Non-bleeding gastric ulcers with a clean ulcer base (Forrest Class III). - Non-bleeding large (> 5 mm) esophageal varices. Incompletely eradicated. Banded. - No specimens collected.   Colonoscopy 03/25/2018 - One 5 mm polyp in the ascending colon, removed with a cold snare. Resected and retrieved. - Rectal varices. - External hemorrhoids. - The examination was otherwise normal.  DIAGNOSIS:  A. COLON POLYP, ASCENDING; COLD SNARE:  - TUBULAR ADENOMA.  - NEGATIVE FOR HIGH-GRADE DYSPLASIA AND MALIGNANCY.  EGD 02/09/18 The examined duodenum was normal. The stomach was normal. Crystal columns of non-bleeding grade III varices were found in the lower third of the esophagus,. Stigmata of recent bleeding were evident and red wale signs were present. Ten bands were successfully placed with incomplete eradication of varices. There was no bleeding during, and at the end, of the procedure.  Past Medical History:  Diagnosis Date  . Acute GI bleeding 02/09/2018  . Acute upper gastrointestinal bleeding 08/03/2018  . Anemia    TAKES IRON  TAB  . Arthritis    SHOULDER  . B12 deficiency 02/18/2018  . Bleeding    GI  3/19  . Bronchitis    HX OF  . Cirrhosis ([place])   . Colon cancer screening   . Diabetes mellitus without complication ([place])    TYPE 2  . Fatty (change of) liver, not elsewhere classified   . Full dentures    UPPER AND LOWER  . Iron deficiency anemia due to chronic blood loss 02/18/2018  . Lung nodule, multiple   . Pernicious anemia 02/22/2018  . PUD (peptic ulcer disease)   . Upper GI bleeding 08/03/2018    Past Surgical History:  Procedure Laterality Date  . CATARACT EXTRACTION W/PHACO Right 02/06/2018   Procedure: CATARACT EXTRACTION PHACO AND INTRAOCULAR LENS PLACEMENT ([place]) RIGHT DIABETIC;  Surgeon: Leandrew Koyanagi, MD;  Location: [place];  Service: Ophthalmology;  Laterality: Right;  DIABETIC, ORAL MED  . CATARACT EXTRACTION W/PHACO Left 04/30/2018   Procedure: CATARACT EXTRACTION PHACO AND INTRAOCULAR LENS PLACEMENT (IOC);  Surgeon: Leandrew Koyanagi, MD;  Location: [facility];  Service: Ophthalmology;  Laterality: Left;  [place] 01:04 AP% 21.3 CDE 13.66 Fluid pack lot # [id number]  . COLONOSCOPY WITH PROPOFOL N/A 03/25/2018  Procedure: COLONOSCOPY WITH PROPOFOL;  Surgeon: Lin Landsman, MD;  Location: [facility] ENDOSCOPY;  Service: Gastroenterology;  Laterality: N/A;  . DENTAL SURGERY     EXTRACTIONS  . ESOPHAGOGASTRODUODENOSCOPY N/A 08/03/2018   Procedure: ESOPHAGOGASTRODUODENOSCOPY (EGD);  Surgeon: Lin Landsman, MD;  Location: [facility] ENDOSCOPY;  Service: Gastroenterology;  Laterality: N/A;  . ESOPHAGOGASTRODUODENOSCOPY (EGD) WITH PROPOFOL N/A 02/10/2018   Procedure: ESOPHAGOGASTRODUODENOSCOPY (EGD) WITH PROPOFOL;  Surgeon: Jonathon Bellows, MD;  Location: [facility] ENDOSCOPY;  Service: Gastroenterology;  Laterality: N/A;  . ESOPHAGOGASTRODUODENOSCOPY (EGD) WITH PROPOFOL N/A 03/25/2018   Procedure: ESOPHAGOGASTRODUODENOSCOPY (EGD) WITH PROPOFOL;  Surgeon: Lin Landsman, MD;  Location: [facility]  ENDOSCOPY;  Service: Gastroenterology;  Laterality: N/A;  . ESOPHAGOGASTRODUODENOSCOPY (EGD) WITH PROPOFOL N/A 04/22/2018   Procedure: ESOPHAGOGASTRODUODENOSCOPY (EGD) WITH PROPOFOL with band ligation;  Surgeon: Lin Landsman, MD;  Location: [place];  Service: Gastroenterology;  Laterality: N/A;  . ESOPHAGOGASTRODUODENOSCOPY (EGD) WITH PROPOFOL N/A 06/24/2018   Procedure: ESOPHAGOGASTRODUODENOSCOPY (EGD) WITH PROPOFOL;  Surgeon: Lin Landsman, MD;  Location: [facility] ENDOSCOPY;  Service: Gastroenterology;  Laterality: N/A;  . ESOPHAGOGASTRODUODENOSCOPY (EGD) WITH PROPOFOL N/A 11/19/2018   Procedure: ESOPHAGOGASTRODUODENOSCOPY (EGD) WITH PROPOFOL;  Surgeon: Lin Landsman, MD;  Location: [facility] ENDOSCOPY;  Service: Gastroenterology;  Laterality: N/A;  . EYE SURGERY    . HEMORRHOID BANDING  03/25/2018   Procedure: HEMORRHOID BANDING;  Surgeon: Lin Landsman, MD;  Location: [facility] ENDOSCOPY;  Service: Gastroenterology;;  . IR EMBO ART  VEN HEMORR LYMPH EXTRAV  INC GUIDE ROADMAPPING  08/07/2018  . IR RADIOLOGIST EVAL & MGMT  09/03/2018  . IR TIPS  08/07/2018  . RADIOLOGY WITH ANESTHESIA N/A 08/07/2018   Procedure: TIPS;  Surgeon: Corrie Mckusick, DO;  Location: [place];  Service: Anesthesiology;  Laterality: N/A;     Current Outpatient Medications:  .  cyanocobalamin (,VITAMIN B-12,) 1000 MCG/ML injection, Inject 1,000 mcg into the muscle once., Disp: , Rfl:  .  furosemide (LASIX) 20 MG tablet, Take 40 mg by mouth daily. , Disp: , Rfl:  .  lactulose (CHRONULAC) 10 GM/15ML solution, TAKE 30 MLS (20 G TOTAL) BY MOUTH 2 (TWO) TIMES DAILY., Disp: 1800 mL, Rfl: 2 .  metFORMIN (GLUCOPHAGE) 500 MG tablet, Take 1,000 mg by mouth 2 (two) times daily with a meal. , Disp: , Rfl:  .  nystatin (MYCOSTATIN/NYSTOP) powder, Apply 1 g topically as needed (rash). , Disp: , Rfl:  .  ondansetron (ZOFRAN) 4 MG tablet, Take 1 tablet (4 mg total) by mouth every 6 (six) hours as needed for nausea., Disp: 20  tablet, Rfl: 0 .  ONE TOUCH ULTRA TEST test strip, 2 (two) times daily., Disp: , Rfl: 5 .  ONETOUCH DELICA LANCETS 01S MISC, 2 (two) times daily., Disp: , Rfl: 5 .  pantoprazole (PROTONIX) 40 MG tablet, Take 40 mg by mouth 2 (two) times daily., Disp: , Rfl: 5 .  rifaximin (XIFAXAN) 550 MG TABS tablet, Take 1 tablet (550 mg total) by mouth 2 (two) times daily., Disp: 180 tablet, Rfl: 1 .  spironolactone (ALDACTONE) 50 MG tablet, Take 1 tablet (50 mg total) by mouth daily., Disp: 180 tablet, Rfl: 0 .  fluconazole (DIFLUCAN) 150 MG tablet, TAKE 1 TABLET NOW, IF NO IMPROVEMENT REPEAT IN 1 WEEK, Disp: , Rfl: 1 .  hydrOXYzine (ATARAX/VISTARIL) 10 MG tablet, Take 1 tablet (10 mg total) by mouth 3 (three) times daily as needed for itching., Disp: 30 tablet, Rfl: 0 .  pantoprazole (PROTONIX) 40 MG tablet, Take 1 tablet (40 mg total) by  mouth 2 (two) times daily., Disp: 60 tablet, Rfl: 0   Family History  Problem Relation Age of Onset  . CAD Father   . Cancer Maternal Uncle      Social History   Tobacco Use  . Smoking status: Never Smoker  . Smokeless tobacco: Never Used  Substance Use Topics  . Alcohol use: No    Frequency: Never  . Drug use: Never    Allergies as of 12/11/2018  . (No Known Allergies)    Review of Systems:    All systems reviewed and negative except where noted in HPI.   Physical Exam:  BP 124/73 (BP Location: Left Arm, Patient Position: Sitting, Cuff Size: Large)   Pulse 71   Resp 17   Ht 5' 3" (1.6 m)   Wt 187 lb 3.2 oz (84.9 kg)   LMP  (LMP Unknown)   BMI 33.16 kg/m  No LMP recorded (lmp unknown). Patient is postmenopausal.  General:   Alert,  Well-developed, well-nourished, pleasant and cooperative in NAD, lethargic Head:  Normocephalic and atraumatic. Eyes:  Sclera clear, no icterus.   Conjunctiva pink. Ears:  Normal auditory acuity. Nose:  No deformity, discharge, or lesions. Mouth:  No deformity or lesions,oropharynx pink & moist. Neck:  Supple; no  masses or thyromegaly. Lungs:  Respirations even and unlabored.  Clear throughout to auscultation.   No wheezes, crackles, or rhonchi. No acute distress. Heart:  Regular rate and rhythm; no murmurs, clicks, rubs, or gallops. Abdomen:  Normal bowel sounds. Soft, non-tender and nondistended, without masses, hepatosplenomegaly or hernias noted.  No guarding or rebound tenderness.   Rectal: Not performed Msk:  Symmetrical without gross deformities. muscle wasting in bilateral upper extremities Good, equal movement & strength bilaterally. Pulses:  Normal pulses noted. Extremities:  No clubbing, 3+ edema, peripheral cyanosis in her bilateral hands. Neurologic:  Alert and oriented x3;  grossly normal neurologically. Skin: telangiectasias on upper anterior chest, No jaundice. Lymph Nodes:  No significant cervical adenopathy. Psych:  Alert and cooperative. Normal mood and affect.  Imaging Studies: Reviewed. CT A/P with contrast on 02/09/2018 revealed mildly enlarged retroperitoneal lymph nodes, largest with 11 mm concerning for metastatic disease or malignancy,18 mm spiculated density in the right lower lobe concerning for malignancy associated with small left pleural effusion  Assessment and Plan:   Crystal [name] is a 67 y.o. Caucasian female with metabolic syndrome, with decompensated cirrhosis secondary to ascites and refractory variceal bleed from esophageal varices status post TIPS and coil embolization of the left gastric vein here for follow-up  Decompensated cirrhosis: child class B, MELD-Na 9 Secondary liver disease workup positive for HCV antibodies, but viral load not detected, anti-smooth muscle antibodies weakly positive. 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 Ceruloplasmin, antimitochondrial antibodies, hepatitis B, alpha-1 antitrypsin came back negative Portal hypertension: manifested as ascites, esophageal varices, splenomegaly S/p  TIPS: Secondary to refractory variceal bleed. Follow-up with interventional radiology every 6 months.  TIPS patent as of 08/2018 Ascites/volume overload:currently hypervolemic with bilateral severe swelling of legs.  Restarted Lasix, advised increase from 40 mg once a day to twice daily for 3 days and then go back to once a day, increase spironolactone to 100 mg daily. Suggested her to check weight daily, continue low-sodium diet. Avoid processed meats, and foods, chips, carbonated beverages.  She does have grade 1 diastolic dysfunction prior to TIPS placement, I am worried that with TIPS placement, if it has gotten worse.  Therefore,  I will refer her to cardiology.  Ultrasound Doppler to evaluate for TIPS patency Varices: variceal bleed from esophageal varices, status post EGD on 02/10/2018, EVLx10, status post EGD 03/25/2018 EVLx4, s/p EGD 04/22/18 EVLx2 and hemospray, refractory variceal bleed 08/03/2018, status post TIPS with coil embolization of left gastric vein on 08/07/2018.  Stopped propranolol  Repeat EGD in 11/2018 revealed no varices at all.   Pruritus: Start hydroxyzine 10 mg 3 times a day as needed, recheck LFTs today  Anemia: Resolved, multifactorial- combination of peptic ulcer disease, gastric AVMs, portal hypertensive gastropathy, esophageal varices, GAVE. she has developed severe iron deficiency anemia secondary to variceal hemorrhage, peptic ulcer disease, gastric AVMs and portal hypertensive gastropathy. Hemoglobin  stable , iron studies revealed improved ferritin levels, B12 deficiency has resolved. There is no evidence of coagulopathy.  She has mild thrombocytopenia HCC screening: AFP normal, no liver lesions on CT in 07/2018.  Repeat ultrasound in 1 year PSE: Started on lactulose post TIPS.  Continue rifaximin 550 mg twice daily, long-term.  HRS: none Hepatic hydrothorax: none  Health maintenance:  She is immune to hepatitis B She received Pneumovax and influenza vaccine in  07/2018 vitamin D levels 29.2 on 03/15/2018 Avoid excess Tylenol and NSAIDs use Colon cancer screening:underwent colonoscopy on 04/11/2018, One 5 mm polyp in the ascending colon, removed with a cold snare, Patch showed tubular adenoma with no evidence of high-grade dysplasia or malignancy. Recommend repeat colonoscopy in 5 years Right lower lobe lung lesion and retroperitoneal lymphadenopathy: follow-up with Dr. Mike [name], no evidence of retroperitoneal lymphadenopathy. Repeat CT chest revealed that the lesions were stable. She was recommended to undergo bronchoscopy and referred to Dr Mortimer Fries who felt that given the stable size of lung lesions, to defer bronchoscopy at this time  Follow up in 2 months  Patient instructions provided    Crystal Darby, MD

## 2018-12-11 NOTE — Patient Instructions (Signed)
1.  Recheck LFTs today 2.  Increase furosemide to 40 mg twice daily for 3 days then go back to 40 mg once a day 3.  Increase spironolactone to 100 milligrams daily 4.  Doppler ultrasound to evaluate for TIPS patency 5.  Start hydroxyzine at bedtime for itching and insomnia 6.  Recheck labs in 1 week  Please call our office to speak with my nurse Barnie Alderman [phone] during business hours from 8am to 4pm if you have any questions/concerns. During after hours, you will be redirected to on call GI physician. For any emergency please call 911 or go the nearest emergency room.    [name], MD [address]  [place]  [place], [place] [address]  Main: [phone]  Fax: [phone]

## 2018-12-12 ENCOUNTER — Other Ambulatory Visit: Payer: Self-pay | Admitting: Interventional Radiology

## 2018-12-12 DIAGNOSIS — E7800 Pure hypercholesterolemia, unspecified: Secondary | ICD-10-CM | POA: Diagnosis not present

## 2018-12-12 DIAGNOSIS — I10 Essential (primary) hypertension: Secondary | ICD-10-CM | POA: Diagnosis not present

## 2018-12-12 DIAGNOSIS — E669 Obesity, unspecified: Secondary | ICD-10-CM | POA: Diagnosis not present

## 2018-12-12 DIAGNOSIS — K766 Portal hypertension: Secondary | ICD-10-CM

## 2018-12-12 DIAGNOSIS — D649 Anemia, unspecified: Secondary | ICD-10-CM | POA: Diagnosis not present

## 2018-12-12 DIAGNOSIS — E119 Type 2 diabetes mellitus without complications: Secondary | ICD-10-CM | POA: Diagnosis not present

## 2018-12-12 DIAGNOSIS — J449 Chronic obstructive pulmonary disease, unspecified: Secondary | ICD-10-CM | POA: Diagnosis not present

## 2018-12-12 LAB — HEPATIC FUNCTION PANEL
ALT: 41 IU/L — ABNORMAL HIGH (ref 0–32)
AST: 76 IU/L — ABNORMAL HIGH (ref 0–40)
Albumin: 3.4 g/dL — ABNORMAL LOW (ref 3.8–4.8)
Alkaline Phosphatase: 254 IU/L — ABNORMAL HIGH (ref 39–117)
Bilirubin Total: 1.3 mg/dL — ABNORMAL HIGH (ref 0.0–1.2)
Bilirubin, Direct: 0.51 mg/dL — ABNORMAL HIGH (ref 0.00–0.40)
Total Protein: 7.2 g/dL (ref 6.0–8.5)

## 2018-12-13 ENCOUNTER — Ambulatory Visit
Admission: RE | Admit: 2018-12-13 | Discharge: 2018-12-13 | Disposition: A | Discharge status: Home / Self Care | Payer: Medicare HMO | Source: Ambulatory Visit | Attending: Gastroenterology | Admitting: Gastroenterology

## 2018-12-13 DIAGNOSIS — Z95828 Presence of other vascular implants and grafts: Secondary | ICD-10-CM | POA: Insufficient documentation

## 2018-12-13 DIAGNOSIS — K7469 Other cirrhosis of liver: Secondary | ICD-10-CM | POA: Diagnosis not present

## 2018-12-13 DIAGNOSIS — Z8719 Personal history of other diseases of the digestive system: Secondary | ICD-10-CM | POA: Diagnosis not present

## 2018-12-16 ENCOUNTER — Inpatient Hospital Stay: Payer: Medicare HMO | Attending: Hematology and Oncology

## 2018-12-16 ENCOUNTER — Ambulatory Visit
Admission: RE | Admit: 2018-12-16 | Discharge: 2018-12-16 | Disposition: A | Discharge status: Home / Self Care | Payer: Medicare HMO | Source: Ambulatory Visit | Attending: Urgent Care | Admitting: Urgent Care

## 2018-12-16 VITALS — BP 113/63 | HR 73 | Temp 95.3°F | Resp 16

## 2018-12-16 DIAGNOSIS — R911 Solitary pulmonary nodule: Secondary | ICD-10-CM | POA: Insufficient documentation

## 2018-12-16 DIAGNOSIS — E538 Deficiency of other specified B group vitamins: Secondary | ICD-10-CM

## 2018-12-16 DIAGNOSIS — J984 Other disorders of lung: Secondary | ICD-10-CM | POA: Diagnosis not present

## 2018-12-16 MED ORDER — CYANOCOBALAMIN 1000 MCG/ML IJ SOLN
1000.0000 ug | Freq: Once | INTRAMUSCULAR | Status: AC
  Administered 2018-12-16: 1000 ug via INTRAMUSCULAR

## 2018-12-17 ENCOUNTER — Encounter: Payer: Self-pay | Admitting: Radiology

## 2018-12-17 ENCOUNTER — Ambulatory Visit
Admission: RE | Admit: 2018-12-17 | Discharge: 2018-12-17 | Disposition: A | Discharge status: Home / Self Care | Payer: Medicare HMO | Source: Ambulatory Visit | Attending: Interventional Radiology | Admitting: Interventional Radiology

## 2018-12-17 ENCOUNTER — Institutional Professional Consult (permissible substitution): Payer: Medicare HMO | Admitting: Internal Medicine

## 2018-12-17 DIAGNOSIS — R69 Illness, unspecified: Secondary | ICD-10-CM | POA: Diagnosis not present

## 2018-12-17 DIAGNOSIS — Z9889 Other specified postprocedural states: Secondary | ICD-10-CM | POA: Diagnosis not present

## 2018-12-17 DIAGNOSIS — I8501 Esophageal varices with bleeding: Secondary | ICD-10-CM | POA: Diagnosis not present

## 2018-12-17 DIAGNOSIS — K766 Portal hypertension: Secondary | ICD-10-CM

## 2018-12-17 HISTORY — PX: IR RADIOLOGIST EVAL & MGMT: IMG5224

## 2018-12-17 NOTE — Progress Notes (Signed)
Chief Complaint: Patient was seen in consultation today for  Chief Complaint  Patient presents with  . Follow-up    4 mo TIPS.  Edema of lower extremities x 2-3 wks     at the request of Wagner,Jaime  Referring Physician(s): Dr. Sherri Sear (GI)  Supervising Physician: Sandi Mariscal  History of Present Illness: Crystal [name] is a 67 y.o. female with past medical history significant for anemia, PUD, history of GIB with esophageal banding (02/10/2018), portal HTN and cirrhosis s/p successful TIPS creation 08/07/2018 with Dr. [name] who presents to clinic today due to concern for lower extremity edema at the request of Dr. [name] and Dr. Marius Ditch.   Per patient she has been steadily gaining weight since TIPS procedure which she estimates to be about 20 lbs over he normal weight and she states that she suddenly developed bilateral lower extremity "almost overnight" which she did not have prior to TIPS procedure. Per chart patient had previously been prescribed Lasix + spironolactone per GI beginning in early 2019 for volume overload, although she did not complain of lower extremity edema until recently. Patient states that she experienced bilateral lower extremity edema for 2-3 weeks early this year however it has since resolved after a temporary increase in her Lasix by Dr. Marius Ditch. She has never had any trouble with her heart that she is aware of and has never seen a cardiologist, although she has recently been referred to one.   She denies dyspnea, orthopnea, chest pain, cough, yellowing of the skin/eyes, abdominal distention or confusion. She does endorse intermittent RUQ abdominal pain that is worse with eating, but does not occur [name] time she eats. She follows up regularly with her PCP, GI and oncology. She states that she is compliant with her medications.   Past Medical History:  Diagnosis Date  . Acute GI bleeding 02/09/2018  . Acute upper gastrointestinal bleeding 08/03/2018  .  Anemia    TAKES IRON TAB  . Arthritis    SHOULDER  . B12 deficiency 02/18/2018  . Bleeding    GI  3/19  . Bronchitis    HX OF  . Cirrhosis ([place])   . Colon cancer screening   . Diabetes mellitus without complication ([place])    TYPE 2  . Fatty (change of) liver, not elsewhere classified   . Full dentures    UPPER AND LOWER  . Iron deficiency anemia due to chronic blood loss 02/18/2018  . Lung nodule, multiple   . Pernicious anemia 02/22/2018  . PUD (peptic ulcer disease)   . Upper GI bleeding 08/03/2018    Past Surgical History:  Procedure Laterality Date  . CATARACT EXTRACTION W/PHACO Right 02/06/2018   Procedure: CATARACT EXTRACTION PHACO AND INTRAOCULAR LENS PLACEMENT ([place]) RIGHT DIABETIC;  Surgeon: Leandrew Koyanagi, MD;  Location: [place];  Service: Ophthalmology;  Laterality: Right;  DIABETIC, ORAL MED  . CATARACT EXTRACTION W/PHACO Left 04/30/2018   Procedure: CATARACT EXTRACTION PHACO AND INTRAOCULAR LENS PLACEMENT ([facility]);  Surgeon: Leandrew Koyanagi, MD;  Location: [facility];  Service: Ophthalmology;  Laterality: Left;  [place] 01:04 AP% 21.3 CDE 13.66 Fluid pack lot # [id number]  . COLONOSCOPY WITH PROPOFOL N/A 03/25/2018   Procedure: COLONOSCOPY WITH PROPOFOL;  Surgeon: Lin Landsman, MD;  Location: [facility] ENDOSCOPY;  Service: Gastroenterology;  Laterality: N/A;  . DENTAL SURGERY     EXTRACTIONS  . ESOPHAGOGASTRODUODENOSCOPY N/A 08/03/2018   Procedure: ESOPHAGOGASTRODUODENOSCOPY (EGD);  Surgeon: Lin Landsman, MD;  Location: [facility] ENDOSCOPY;  Service:  Gastroenterology;  Laterality: N/A;  . ESOPHAGOGASTRODUODENOSCOPY (EGD) WITH PROPOFOL N/A 02/10/2018   Procedure: ESOPHAGOGASTRODUODENOSCOPY (EGD) WITH PROPOFOL;  Surgeon: Jonathon Bellows, MD;  Location: [facility] ENDOSCOPY;  Service: Gastroenterology;  Laterality: N/A;  . ESOPHAGOGASTRODUODENOSCOPY (EGD) WITH PROPOFOL N/A 03/25/2018   Procedure: ESOPHAGOGASTRODUODENOSCOPY (EGD) WITH PROPOFOL;  Surgeon: Lin Landsman,  MD;  Location: [facility] ENDOSCOPY;  Service: Gastroenterology;  Laterality: N/A;  . ESOPHAGOGASTRODUODENOSCOPY (EGD) WITH PROPOFOL N/A 04/22/2018   Procedure: ESOPHAGOGASTRODUODENOSCOPY (EGD) WITH PROPOFOL with band ligation;  Surgeon: Lin Landsman, MD;  Location: [place];  Service: Gastroenterology;  Laterality: N/A;  . ESOPHAGOGASTRODUODENOSCOPY (EGD) WITH PROPOFOL N/A 06/24/2018   Procedure: ESOPHAGOGASTRODUODENOSCOPY (EGD) WITH PROPOFOL;  Surgeon: Lin Landsman, MD;  Location: [facility] ENDOSCOPY;  Service: Gastroenterology;  Laterality: N/A;  . ESOPHAGOGASTRODUODENOSCOPY (EGD) WITH PROPOFOL N/A 11/19/2018   Procedure: ESOPHAGOGASTRODUODENOSCOPY (EGD) WITH PROPOFOL;  Surgeon: Lin Landsman, MD;  Location: [facility] ENDOSCOPY;  Service: Gastroenterology;  Laterality: N/A;  . EYE SURGERY    . HEMORRHOID BANDING  03/25/2018   Procedure: HEMORRHOID BANDING;  Surgeon: Lin Landsman, MD;  Location: [facility] ENDOSCOPY;  Service: Gastroenterology;;  . IR EMBO ART  VEN HEMORR LYMPH EXTRAV  INC GUIDE ROADMAPPING  08/07/2018  . IR RADIOLOGIST EVAL & MGMT  09/03/2018  . IR TIPS  08/07/2018  . RADIOLOGY WITH ANESTHESIA N/A 08/07/2018   Procedure: TIPS;  Surgeon: Corrie Mckusick, DO;  Location: [place];  Service: Anesthesiology;  Laterality: N/A;    Allergies: Patient has no known allergies.  Medications: Prior to Admission medications   Medication Sig Start Date End Date Taking? Authorizing Provider  cyanocobalamin (,VITAMIN B-12,) 1000 MCG/ML injection Inject 1,000 mcg into the muscle once.    [provider]  fluconazole (DIFLUCAN) 150 MG tablet TAKE 1 TABLET NOW, IF NO IMPROVEMENT REPEAT IN 1 WEEK 09/20/18   [provider]  furosemide (LASIX) 20 MG tablet Take 40 mg by mouth daily.  09/10/18   [provider]  hydrOXYzine (ATARAX/VISTARIL) 10 MG tablet Take 1 tablet (10 mg total) by mouth 3 (three) times daily as needed for itching. 12/11/18   Lin Landsman, MD    lactulose (CHRONULAC) 10 GM/15ML solution TAKE 30 MLS (20 G TOTAL) BY MOUTH 2 (TWO) TIMES DAILY. 11/14/18 02/12/19  Lin Landsman, MD  metFORMIN (GLUCOPHAGE) 500 MG tablet Take 1,000 mg by mouth 2 (two) times daily with a meal.     [provider]  nystatin (MYCOSTATIN/NYSTOP) powder Apply 1 g topically as needed (rash).  05/23/18   [provider]  ondansetron (ZOFRAN) 4 MG tablet Take 1 tablet (4 mg total) by mouth [name] 6 (six) hours as needed for nausea. 08/03/18   Rosine Door, MD  ONE TOUCH ULTRA TEST test strip 2 (two) times daily. 09/13/18   [provider]  [facility] DELICA LANCETS 16X MISC 2 (two) times daily. 09/13/18   [provider]  pantoprazole (PROTONIX) 40 MG tablet Take 1 tablet (40 mg total) by mouth 2 (two) times daily. 08/10/18 11/20/18  Arrien, Jimmy Picket, MD  pantoprazole (PROTONIX) 40 MG tablet Take 40 mg by mouth 2 (two) times daily. 10/05/18   [provider]  rifaximin (XIFAXAN) 550 MG TABS tablet Take 1 tablet (550 mg total) by mouth 2 (two) times daily. 08/27/18 02/23/19  Lin Landsman, MD  spironolactone (ALDACTONE) 50 MG tablet Take 1 tablet (50 mg total) by mouth daily. 12/11/18   Lin Landsman, MD     Family History  Problem Relation  Age of Onset  . CAD Father   . Cancer Maternal Uncle     Social History   Socioeconomic History  . Marital status: Single    Spouse name: Not on file  . Number of children: Not on file  . Years of education: Not on file  . Highest education level: Not on file  Occupational History  . Not on file  Social Needs  . Financial resource strain: Not on file  . Food insecurity:    Worry: Not on file    Inability: Not on file  . Transportation needs:    Medical: Not on file    Non-medical: Not on file  Tobacco Use  . Smoking status: Never Smoker  . Smokeless tobacco: Never Used  Substance and Sexual Activity  . Alcohol use: No    Frequency: Never  . Drug use:  Never  . Sexual activity: Not on file  Lifestyle  . Physical activity:    Days per week: Not on file    Minutes per session: Not on file  . Stress: Not on file  Relationships  . Social connections:    Talks on phone: Not on file    Gets together: Not on file    Attends religious service: Not on file    Active member of club or organization: Not on file    Attends meetings of clubs or organizations: Not on file    Relationship status: Not on file  Other Topics Concern  . Not on file  Social History Narrative   Independent at baseline. Lives at home with family    Review of Systems: A 12 point ROS discussed and pertinent positives are indicated in the HPI above.  All other systems are negative.  Review of Systems  Constitutional: Positive for fatigue (baseline) and unexpected weight change (weight gain). Negative for appetite change, chills and fever.  Respiratory: Negative for cough and shortness of breath.        (-) orthopnea  Cardiovascular: Negative for chest pain and leg swelling (None currently - resolved after increase in Lasix).  Gastrointestinal: Positive for abdominal pain (intermittent RUQ pain with eating at times, does not occur often and goes away quickly). Negative for abdominal distention, blood in stool, constipation, diarrhea, nausea and vomiting.  Genitourinary: Negative for dysuria and hematuria.  Skin: Negative for color change.  Neurological: Negative for dizziness, tremors, syncope, weakness and headaches.  Psychiatric/Behavioral: Negative for confusion.    Vital Signs: BP 112/60   Pulse 67   Temp 97.8 F (36.6 C) (Oral)   Resp 15   Ht 5' 3" (1.6 m)   Wt 178 lb 9.6 oz (81 kg) Comment: Fully dressed  LMP  (LMP Unknown)   BMI 31.64 kg/m   Physical Exam Vitals signs reviewed.  Constitutional:      General: She is not in acute distress.    Appearance: She is not ill-appearing.  HENT:     Head: Normocephalic.  Eyes:     General: No scleral  icterus. Cardiovascular:     Rate and Rhythm: Normal rate and regular rhythm.  Pulmonary:     Effort: Pulmonary effort is normal.     Breath sounds: Normal breath sounds.  Abdominal:     General: There is no distension.     Palpations: Abdomen is soft.     Tenderness: There is no abdominal tenderness.  Musculoskeletal:     Right lower leg: No edema.     Left lower leg:  No edema.  Skin:    General: Skin is warm and dry.     Coloration: Skin is not jaundiced.  Neurological:     Mental Status: She is alert and oriented to person, place, and time.  Psychiatric:        Mood and Affect: Mood normal.        Behavior: Behavior normal.        Thought Content: Thought content normal.        Judgment: Judgment normal.     Imaging: Ct Chest Wo Contrast  Result Date: 12/16/2018 CLINICAL DATA:  Followup pulmonary lesions.  History of cirrhosis. EXAM: CT CHEST WITHOUT CONTRAST TECHNIQUE: Multidetector CT imaging of the chest was performed following the standard protocol without IV contrast. COMPARISON:  Chest CT 08/15/2018 and PET-CT 03/08/2018 FINDINGS: Cardiovascular: The heart is borderline enlarged but stable. No pericardial effusion. Stable tortuosity and mild ectasia of the thoracic aorta. No focal aneurysm. Stable age advanced three-vessel coronary artery calcifications. Mediastinum/Nodes: Stable scattered mediastinal and hilar lymph nodes but no mass or overt adenopathy. The esophagus is grossly normal. Lungs/Pleura: Enlarging right middle lobe pulmonary lesion measuring 29 x 23 mm on image number 78 of series 3. This previously measured 24.5 x 18 mm. Enlarging right lower lobe pulmonary nodule on image number 79 of series 3. There is measures 20 x 18.5 mm and previously measured 18 x 16 mm. These lesions are worrisome for synchronous lung cancers. Recommend biopsy. Both lesion should be amenable to bronchoscopic biopsy. Stable underlying emphysematous changes and areas of pulmonary scarring. No  new pulmonary nodules. No acute overlying pulmonary process. No pleural effusions. Upper Abdomen: Stable cirrhotic changes involving the liver and splenomegaly. Intrahepatic stent is noted. Musculoskeletal: No significant bony findings. No breast masses, supraclavicular or axillary adenopathy. IMPRESSION: 1. Enlarging right middle lobe and right lower lobe spiculated pulmonary lesions highly worrisome for synchronous lung cancers. Recommend biopsy. 2. No new pulmonary lesions or acute pulmonary findings. 3. No enlarged mediastinal or hilar lymph nodes. 4. Stable age advanced three-vessel coronary artery calcifications. 5. Stable changes of cirrhosis and splenomegaly. Aortic Atherosclerosis (ICD10-I70.0) and Emphysema (ICD10-J43.9). Electronically Signed   By: Marijo Sanes M.D.   On: 12/16/2018 15:23   US Liver Doppler  Result Date: 12/13/2018 CLINICAL DATA:  History of cirrhosis with recurrent esophageal bleeding, post TIPS creation 08/07/2018, now with concern for developing congestive heart failure. EXAM: DUPLEX ULTRASOUND OF LIVER AND TIPS SHUNT TECHNIQUE: Color and duplex Doppler ultrasound was performed to evaluate the hepatic in-flow and out-flow vessels. COMPARISON:  Image guided TIPS creation-08/07/2018; CT abdomen pelvis-08/05/2018; chest CT-08/15/2018; TIPS Doppler ultrasound-09/03/2018 FINDINGS: Coarsened increased echogenic hepatic parenchyma without discrete hepatic lesion. Portal Vein Velocities Main:  48.3 cm/sec (previously 41 centimeters/second) Right: 37.4 cm/sec Left:  21.9 cm/sec TIPS Stent Velocities Proximal:  84.1 cm/sec (previously, 121 centimeters/second) Mid:  93.7 (previously, 120 centimeters/second) Distal:  129.4 cm/sec (previously, 89 centimeters/second) IVC: Present and patent with normal respiratory phasicity. Hepatic Vein Velocities Right:  152.3 cm/sec Mid:  21.6 cm/sec Left:  23.0 cm/sec Splenic Vein: 45.8 cm/sec Superior Mesenteric Vein: 60.8 cm/sec Hepatic Artery: 66  centimeters/second Ascites: None visualized Varices: None visualized Spleen: 16.2 cm x 7.8 cm x 14.1 cm with a total volume of 930 cm^3 (411 cm^3 is upper limit normal) Portal Vein Occlusion/Thrombus: No Splenic Vein Occlusion/Thrombus: No IMPRESSION: Unremarkable TIPS Doppler ultrasound without evidence occlusion, recurrent ascites or definitive evidence of in stent stenosis. Electronically Signed   By: Eldridge Abrahams.D.  On: 12/13/2018 13:33    Labs:  CBC: Recent Labs    08/10/18 0339 08/21/18 1419 09/25/18 1340 11/20/18 0945  WBC 5.1 4.6 3.5* 3.9*  HGB 8.8* 12.2 11.5* 12.6  HCT 28.7* 40.5 36.2 39.9  PLT 116* 153 131* 105*    COAGS: Recent Labs    03/15/18 1420 08/02/18 2212 08/03/18 0548 08/05/18 0855  INR 1.09 1.09 1.16 1.14  APTT  --   --  32  --     BMP: Recent Labs    08/07/18 0442 08/09/18 0410 08/10/18 0339 11/20/18 0945  NA 141 140 140 138  K 3.8 3.3* 3.8 3.5  CL 114* 112* 111 107  CO2 17* 24 23 21*  GLUCOSE 120* 146* 114* 178*  BUN 9 10 6* 9  CALCIUM 8.3* 8.0* 8.3* 8.7*  CREATININE 0.80 0.77 0.65 0.66  GFRNONAA >60 >60 >60 >60  GFRAA >60 >60 >60 >60    LIVER FUNCTION TESTS: Recent Labs    08/02/18 2212 08/05/18 0445 11/20/18 0945 12/11/18 1624  BILITOT 0.7 0.9 1.5* 1.3*  AST 105* 66* 92* 76*  ALT 79* 60* 43 41*  ALKPHOS 216* 133* 212* 254*  PROT 7.1 5.5* 6.8 7.2  ALBUMIN 3.0* 2.5* 2.8* 3.4*    TUMOR MARKERS: No results for input(s): AFPTM, CEA, CA199, CHROMGRNA in the last 8760 hours.  Assessment:  Patient with history of cirrhosis s/p successful TIPS creation 08/07/2018 who presents today at the request of Dr. Marius Ditch due to concern for new onset bilateral lower extremity edema. Patient has been on furosemide + spironolactone since early 2019 per GI due to volume overload, however she had never experienced lower extremity edema. Per patient she suddenly developed bilateral lower extremity edema early this year lasting for 2-3 weeks which  has now resolved after temporary increase in her furosemide by Dr. Marius Ditch.   She does not have any lower extremity edema on exam today, physical exam is essentially normal, denies any s/s of CHF, vital signs are stable, her weight today is 178 lbs which is near her weigh (176 lbs) at previous IR clinic follow up on 09/03/2018.  US liver doppler from 12/13/18 without evidence of occlusion, recurrent ascites or definitive evidence of in stent stenosis. She is compliant with her medications and follow up appointments. She has been referred to cardiology for evaluation.   Electronically Signed: Joaquim Nam PA-C 12/17/2018, 11:33 AM   Please refer to Dr. Pascal Lux attestation of this note for management and plan.

## 2018-12-18 ENCOUNTER — Inpatient Hospital Stay: Payer: Medicare HMO

## 2018-12-18 ENCOUNTER — Telehealth: Payer: Self-pay | Admitting: *Deleted

## 2018-12-18 ENCOUNTER — Other Ambulatory Visit: Payer: Self-pay

## 2018-12-18 DIAGNOSIS — R2243 Localized swelling, mass and lump, lower limb, bilateral: Secondary | ICD-10-CM | POA: Diagnosis not present

## 2018-12-18 NOTE — Telephone Encounter (Signed)
Pt returned call. I advised her based on notes that you all may need her to come back in to be seen for a f/u tomorrow or Friday and she stated that she would have to come in next week but will call back tomorrow to let us know when.

## 2018-12-18 NOTE — Telephone Encounter (Signed)
-----  Message from [name], MD sent at 12/18/2018 12:56 PM EST ----- Regarding: abnormal CT chest Hello  PLease have this lady come back to see me tomorrow or Friday morning for follow up  She will need ENB  Thank you.

## 2018-12-18 NOTE — Telephone Encounter (Signed)
LMTCB

## 2018-12-19 ENCOUNTER — Inpatient Hospital Stay: Payer: Medicare HMO

## 2018-12-19 ENCOUNTER — Other Ambulatory Visit: Payer: Self-pay | Admitting: Gastroenterology

## 2018-12-19 LAB — BASIC METABOLIC PANEL
BUN/Creatinine Ratio: 11 — ABNORMAL LOW (ref 12–28)
BUN: 8 mg/dL (ref 8–27)
CO2: 21 mmol/L (ref 20–29)
Calcium: 9 mg/dL (ref 8.7–10.3)
Chloride: 100 mmol/L (ref 96–106)
Creatinine, Ser: 0.75 mg/dL (ref 0.57–1.00)
GFR calc Af Amer: 96 mL/min/{1.73_m2} (ref >59)
GFR calc non Af Amer: 83 mL/min/{1.73_m2} (ref >59)
Glucose: 155 mg/dL — ABNORMAL HIGH (ref 65–99)
Potassium: 3.6 mmol/L (ref 3.5–5.2)
Sodium: 140 mmol/L (ref 134–144)

## 2018-12-19 NOTE — Telephone Encounter (Signed)
Thank you for info

## 2018-12-20 NOTE — Telephone Encounter (Signed)
Left reminder message for patient to call office to schedule f/u with Dr. Mortimer Fries.

## 2018-12-24 ENCOUNTER — Encounter: Payer: Self-pay | Admitting: Internal Medicine

## 2018-12-24 ENCOUNTER — Other Ambulatory Visit: Payer: Self-pay

## 2018-12-24 ENCOUNTER — Ambulatory Visit (INDEPENDENT_AMBULATORY_CARE_PROVIDER_SITE_OTHER): Payer: Medicare HMO | Admitting: Internal Medicine

## 2018-12-24 VITALS — BP 120/62 | HR 71 | Resp 16 | Ht 63.0 in | Wt 182.0 lb

## 2018-12-24 DIAGNOSIS — R2243 Localized swelling, mass and lump, lower limb, bilateral: Secondary | ICD-10-CM

## 2018-12-24 DIAGNOSIS — R918 Other nonspecific abnormal finding of lung field: Secondary | ICD-10-CM

## 2018-12-24 NOTE — Patient Instructions (Signed)
Recommend Electromagnetic navigational Bronchscopy

## 2018-12-24 NOTE — Progress Notes (Signed)
Name: Crystal [name] MRN: [id number] DOB: [birth year]     CONSULTATION DATE: 12/04/2018 REFERRING MD : Pearline Cables  CHIEF COMPLAINT: abnormal CT chest  STUDIES:     CXR independently reviewed by Me    02/2018 CT chest Independently reviewed by Me Right middle lobe opacity 23 x 14 mm  right lower lobe opacity 15 x 14 mm  05/2018 CT chest independently reviewed by me Right middle lobe opacity 23 x 14 mm  right lower lobe opacity 14 x 17 mm  08/2018 CT chest independently reviewed by me Right middle lobe opacity 20 x 14 mm  right lower lobe opacity 18 x 15 mm  2.3.2020 I have Independently reviewed images of  Ct chest  on 12/24/2018 Interpretation: 2 lung nodules suggest malignancy They have increased in size Right middle lobe opacity 20 x 14 mm -->29x25m right lower lobe opacity 18 x 15 mm-->20x18    HISTORY OF PRESENT ILLNESS: Follow-up abnormal CT chest 2 right lung masses and nodules have increased in size since previous CAT scan  Patient has previous history of liver cirrhosis with variceal bleeding Status post TIPS history of NKarlene Lineman  Based on recent CT chest there has been significant increase in the right middle lobe and right lower lobe opacifications which can suggest malignancy   Patient is non-smoker No secondhand smoke exposure  No fevers no chills No nausea vomiting diarrhea No evidence of respiratory distress   No signs of infection at this time     Review of Systems:  Gen:  Denies  fever, sweats, chills weigh loss  HEENT: Denies blurred vision, double vision, ear pain, eye pain, hearing loss, nose bleeds, sore throat Cardiac:  No dizziness, chest pain or heaviness, chest tightness,edema, No JVD Resp:   No cough, -sputum production, -shortness of breath,-wheezing, -hemoptysis,  Gi: Denies swallowing difficulty, stomach pain, nausea or vomiting, diarrhea, constipation, bowel incontinence Gu:  Denies bladder incontinence, burning urine Ext:   Denies  Joint pain, stiffness or swelling Skin: Denies  skin rash, easy bruising or bleeding or hives Endoc:  Denies polyuria, polydipsia , polyphagia or weight change Psych:   Denies depression, insomnia or hallucinations  Other:  All other systems negative    BP 120/62 (BP Location: Left Arm, Cuff Size: Normal)   Pulse 71   Resp 16   Ht 5' 3" (1.6 m)   Wt 182 lb (82.6 kg)   LMP  (LMP Unknown)   SpO2 91%   BMI 32.24 kg/m   Physical Examination:   GENERAL:NAD, no fevers, chills, no weakness no fatigue HEAD: Normocephalic, atraumatic.  EYES: PERLA, EOMI No scleral icterus.  MOUTH: Moist mucosal membrane.  EAR, NOSE, THROAT: Clear without exudates. No external lesions.  NECK: Supple. No thyromegaly.  No JVD.  PULMONARY: CTA B/L no wheezing, rhonchi, crackles CARDIOVASCULAR: S1 and S2. Regular rate and rhythm. No murmurs GASTROINTESTINAL: Soft, nontender, nondistended. Positive bowel sounds.  MUSCULOSKELETAL: No swelling, clubbing, or edema.  NEUROLOGIC: No gross focal neurological deficits. 5/5 strength all extremities SKIN: No ulceration, lesions, rashes, or cyanosis.  PSYCHIATRIC: Insight, judgment intact. -depression -anxiety ALL OTHER ROS ARE NEGATIVE         ASSESSMENT / PLAN:  68year old pleasant white female seen today for abnormal CT chest with right middle lobe mass and right lower lobe mass that have increased in size since the previous CAT scan which strongly suggest probable underlying malignancy   Patient does not have any significant respiratory issues at this time however  she does have liver cirrhosis  At this time I would recommend obtaining proceeding with electromagnetic navigational bronchoscopy for tissue sampling for definitive diagnosis  The Risks and Benefits of the Bronchoscopy procedure with ENB were explained to patient/family.  I have discussed the risk for Acute Bleeding, increased chance of Infection, increased chance of Respiratory Failure and  Cardiac Arrest, increased chance of pneumothorax and collapsed lung, as well as increased Stroke and Death.  I have also explained to avoid all types of NSAIDs 1 week prior to procedure date  to decrease chance of bleeding, and also to avoid food and drinks the midnight prior to procedure.  The procedure consists of a video camera with a light source to be placed and inserted  into the lungs to  look for abnormal tissue and to obtain tissue samples by using needle and biopsy tools.  The patient/family understand the risks and benefits and has NOT agreed to proceed with procedure at this time  She will call us and let us know  Patient is a moderate to mod risk for post procedure complications due to her underlying cirrhosis with increased chance of bleeding and death    Patient satisfied with Plan of action and management. All questions answered  Corrin Parker, M.D.  [facility]  Medical Director Power Director [facility] Cardio-Pulmonary Department

## 2018-12-31 ENCOUNTER — Other Ambulatory Visit: Payer: Self-pay

## 2018-12-31 ENCOUNTER — Telehealth: Payer: Self-pay | Admitting: *Deleted

## 2018-12-31 ENCOUNTER — Telehealth: Payer: Self-pay | Admitting: Internal Medicine

## 2018-12-31 DIAGNOSIS — R69 Illness, unspecified: Secondary | ICD-10-CM | POA: Diagnosis not present

## 2018-12-31 DIAGNOSIS — R918 Other nonspecific abnormal finding of lung field: Secondary | ICD-10-CM

## 2018-12-31 NOTE — Telephone Encounter (Signed)
Pt scheduled for ENB. Provider:Dr. Mortimer Fries Date: 01/27/19 CPT: 91478

## 2018-12-31 NOTE — Telephone Encounter (Signed)
Duplicate message.

## 2019-01-01 NOTE — Telephone Encounter (Signed)
01/01/2019 at 10:20 am EST spoke with Lyda Jester at [place]. Procedure code [phone] is a valid and billable code which does not require PA. Call Ref # [id number]. Rhonda J Cobb

## 2019-01-02 ENCOUNTER — Telehealth: Payer: Self-pay

## 2019-01-02 NOTE — Telephone Encounter (Signed)
LM for patient to call to get dates and times of Bronchoscopy.

## 2019-01-02 NOTE — Telephone Encounter (Signed)
Spoke to patient, confirmed all dates and times of bronch and Super D. Patient aware. Nothing further needed at this time.

## 2019-01-02 NOTE — Telephone Encounter (Signed)
Pt called back. CB # [phone]

## 2019-01-07 ENCOUNTER — Other Ambulatory Visit: Payer: Self-pay

## 2019-01-07 DIAGNOSIS — R2243 Localized swelling, mass and lump, lower limb, bilateral: Secondary | ICD-10-CM | POA: Diagnosis not present

## 2019-01-08 LAB — BASIC METABOLIC PANEL
BUN/Creatinine Ratio: 11 — ABNORMAL LOW (ref 12–28)
BUN: 9 mg/dL (ref 8–27)
CO2: 20 mmol/L (ref 20–29)
Calcium: 9.2 mg/dL (ref 8.7–10.3)
Chloride: 102 mmol/L (ref 96–106)
Creatinine, Ser: 0.81 mg/dL (ref 0.57–1.00)
GFR calc Af Amer: 88 mL/min/{1.73_m2} (ref >59)
GFR calc non Af Amer: 76 mL/min/{1.73_m2} (ref >59)
Glucose: 222 mg/dL — ABNORMAL HIGH (ref 65–99)
Potassium: 3.9 mmol/L (ref 3.5–5.2)
Sodium: 136 mmol/L (ref 134–144)

## 2019-01-09 ENCOUNTER — Other Ambulatory Visit: Payer: Self-pay

## 2019-01-09 ENCOUNTER — Other Ambulatory Visit: Payer: Self-pay | Admitting: Gastroenterology

## 2019-01-09 DIAGNOSIS — R2243 Localized swelling, mass and lump, lower limb, bilateral: Secondary | ICD-10-CM

## 2019-01-09 NOTE — Progress Notes (Signed)
bmp

## 2019-01-15 ENCOUNTER — Inpatient Hospital Stay: Payer: Medicare HMO | Attending: Hematology and Oncology

## 2019-01-15 VITALS — BP 137/67 | HR 73 | Temp 97.8°F | Resp 18

## 2019-01-15 DIAGNOSIS — E538 Deficiency of other specified B group vitamins: Secondary | ICD-10-CM | POA: Diagnosis present

## 2019-01-15 MED ORDER — CYANOCOBALAMIN 1000 MCG/ML IJ SOLN
1000.0000 ug | Freq: Once | INTRAMUSCULAR | Status: AC
  Administered 2019-01-15: 1000 ug via INTRAMUSCULAR

## 2019-01-16 ENCOUNTER — Other Ambulatory Visit: Payer: Self-pay

## 2019-01-16 ENCOUNTER — Encounter
Admission: RE | Admit: 2019-01-16 | Discharge: 2019-01-16 | Disposition: A | Discharge status: Home / Self Care | Payer: Medicare HMO | Source: Ambulatory Visit | Attending: Internal Medicine | Admitting: Internal Medicine

## 2019-01-16 DIAGNOSIS — Z01812 Encounter for preprocedural laboratory examination: Secondary | ICD-10-CM | POA: Diagnosis not present

## 2019-01-16 HISTORY — DX: Raynaud's syndrome without gangrene: I73.00

## 2019-01-16 LAB — PLATELET COUNT: Platelets: 134 10*3/uL — ABNORMAL LOW (ref 150–400)

## 2019-01-16 NOTE — Pre-Procedure Instructions (Signed)
Patient denies any new or recent cardiac symptoms.

## 2019-01-16 NOTE — Patient Instructions (Signed)
Your procedure is scheduled on: Monday 01/27/19 Report to [place]. To find out your arrival time please call [phone] between 1PM - 3PM on Friday 01/24/19.  Remember: Instructions that are not followed completely may result in serious medical risk, up to and including death, or upon the discretion of your surgeon and anesthesiologist your surgery may need to be rescheduled.     _X__ 1. Do not eat food after midnight the night before your procedure.                 No gum chewing or hard candies. You may drink clear liquids up to 2 hours                 before you are scheduled to arrive for your surgery- DO not drink clear                 liquids within 2 hours of the start of your surgery.                 Clear Liquids include:  water, apple juice without pulp, clear carbohydrate                 drink such as Clearfast or Gatorade, Black Coffee or Tea (Do not add                 anything to coffee or tea).  __X__2.  On the morning of surgery brush your teeth with toothpaste and water, you                 may rinse your mouth with mouthwash if you wish.  Do not swallow any              toothpaste of mouthwash.     _X__ 3.  No Alcohol for 24 hours before or after surgery.   _X__ 4.  Do Not Smoke or use e-cigarettes For 24 Hours Prior to Your Surgery.                 Do not use any chewable tobacco products for at least 6 hours prior to                 surgery.  ____  5.  Bring all medications with you on the day of surgery if instructed.   __X__  6.  Notify your doctor if there is any change in your medical condition      (cold, fever, infections).     Do not wear jewelry, make-up, hairpins, clips or nail polish. Do not wear lotions, powders, or perfumes.  Do not shave 48 hours prior to surgery. Men may shave face and neck. Do not bring valuables to the hospital.    [facility] is not responsible for any belongings or  valuables.  Contacts, dentures/partials or body piercings may not be worn into surgery. Bring a case for your contacts, glasses or hearing aids, a denture cup will be supplied. Leave your suitcase in the car. After surgery it may be brought to your room. For patients admitted to the hospital, discharge time is determined by your treatment team.   Patients discharged the day of surgery will not be allowed to drive home.   Please read over the following fact sheets that you were given:   MRSA Information  __X__ Take these medicines the morning of surgery with A SIP OF WATER:  1. rifaximin (XIFAXAN)  2. hydrOXYzine (ATARAX/VISTARIL) IF NEEDED  3. ondansetron (ZOFRAN) IF NEEDED  4.  5.  6.  ____ Fleet Enema (as directed)   ____ Use CHG Soap/SAGE wipes as directed  ____ Use inhalers on the day of surgery  __X__ Stop metformin/Janumet/Farxiga 2 days prior to surgery    ____ Take 1/2 of usual insulin dose the night before surgery. No insulin the morning          of surgery.   ____ Stop Blood Thinners Coumadin/Plavix/Xarelto/Pleta/Pradaxa/Eliquis/Effient/Aspirin  on   Or contact your Surgeon, Cardiologist or Medical Doctor regarding  ability to stop your blood thinners  __X__ Stop Anti-inflammatories 7 days before surgery such as Advil, Ibuprofen, Motrin,  BC or Goodies Powder, Naprosyn, Naproxen, Aleve, Aspirin    __X__ Stop all herbal supplements, fish oil or vitamin E until after surgery.    ____ Bring C-Pap to the hospital.

## 2019-01-22 ENCOUNTER — Other Ambulatory Visit: Payer: Self-pay

## 2019-01-22 DIAGNOSIS — R2243 Localized swelling, mass and lump, lower limb, bilateral: Secondary | ICD-10-CM | POA: Diagnosis not present

## 2019-01-23 LAB — BASIC METABOLIC PANEL
BUN/Creatinine Ratio: 14 (ref 12–28)
BUN: 12 mg/dL (ref 8–27)
CO2: 20 mmol/L (ref 20–29)
Calcium: 9.8 mg/dL (ref 8.7–10.3)
Chloride: 101 mmol/L (ref 96–106)
Creatinine, Ser: 0.85 mg/dL (ref 0.57–1.00)
GFR calc Af Amer: 83 mL/min/{1.73_m2} (ref >59)
GFR calc non Af Amer: 72 mL/min/{1.73_m2} (ref >59)
Glucose: 135 mg/dL — ABNORMAL HIGH (ref 65–99)
Potassium: 4.1 mmol/L (ref 3.5–5.2)
Sodium: 140 mmol/L (ref 134–144)

## 2019-01-24 ENCOUNTER — Ambulatory Visit
Admission: RE | Admit: 2019-01-24 | Discharge: 2019-01-24 | Disposition: A | Discharge status: Home / Self Care | Payer: Medicare HMO | Source: Ambulatory Visit | Attending: Internal Medicine | Admitting: Internal Medicine

## 2019-01-24 ENCOUNTER — Other Ambulatory Visit: Payer: Self-pay

## 2019-01-24 DIAGNOSIS — R918 Other nonspecific abnormal finding of lung field: Secondary | ICD-10-CM

## 2019-01-24 DIAGNOSIS — Z01811 Encounter for preprocedural respiratory examination: Secondary | ICD-10-CM | POA: Diagnosis not present

## 2019-01-26 ENCOUNTER — Other Ambulatory Visit: Payer: Self-pay | Admitting: Gastroenterology

## 2019-01-26 DIAGNOSIS — K7290 Hepatic failure, unspecified without coma: Secondary | ICD-10-CM

## 2019-01-26 DIAGNOSIS — K7469 Other cirrhosis of liver: Secondary | ICD-10-CM

## 2019-01-26 DIAGNOSIS — K7682 Hepatic encephalopathy: Secondary | ICD-10-CM

## 2019-01-27 ENCOUNTER — Encounter
Admission: RE | Disposition: A | Discharge status: Home / Self Care | Payer: Self-pay | Source: Ambulatory Visit | Attending: Internal Medicine

## 2019-01-27 ENCOUNTER — Ambulatory Visit: Payer: Medicare HMO | Admitting: Anesthesiology

## 2019-01-27 ENCOUNTER — Ambulatory Visit
Admission: RE | Admit: 2019-01-27 | Discharge: 2019-01-27 | Disposition: A | Discharge status: Home / Self Care | Payer: Medicare HMO | Source: Ambulatory Visit | Attending: Internal Medicine | Admitting: Internal Medicine

## 2019-01-27 ENCOUNTER — Ambulatory Visit: Payer: Medicare HMO

## 2019-01-27 ENCOUNTER — Encounter: Payer: Self-pay | Admitting: *Deleted

## 2019-01-27 ENCOUNTER — Other Ambulatory Visit: Payer: Self-pay | Admitting: Gastroenterology

## 2019-01-27 ENCOUNTER — Other Ambulatory Visit: Payer: Self-pay

## 2019-01-27 DIAGNOSIS — Z79899 Other long term (current) drug therapy: Secondary | ICD-10-CM | POA: Insufficient documentation

## 2019-01-27 DIAGNOSIS — M1990 Unspecified osteoarthritis, unspecified site: Secondary | ICD-10-CM | POA: Insufficient documentation

## 2019-01-27 DIAGNOSIS — E119 Type 2 diabetes mellitus without complications: Secondary | ICD-10-CM | POA: Diagnosis not present

## 2019-01-27 DIAGNOSIS — R918 Other nonspecific abnormal finding of lung field: Secondary | ICD-10-CM | POA: Diagnosis not present

## 2019-01-27 DIAGNOSIS — Z7984 Long term (current) use of oral hypoglycemic drugs: Secondary | ICD-10-CM | POA: Insufficient documentation

## 2019-01-27 DIAGNOSIS — E1151 Type 2 diabetes mellitus with diabetic peripheral angiopathy without gangrene: Secondary | ICD-10-CM | POA: Diagnosis not present

## 2019-01-27 DIAGNOSIS — D381 Neoplasm of uncertain behavior of trachea, bronchus and lung: Secondary | ICD-10-CM | POA: Diagnosis not present

## 2019-01-27 DIAGNOSIS — I739 Peripheral vascular disease, unspecified: Secondary | ICD-10-CM | POA: Insufficient documentation

## 2019-01-27 HISTORY — PX: ELECTROMAGNETIC NAVIGATION BROCHOSCOPY: SHX5369

## 2019-01-27 LAB — GLUCOSE, CAPILLARY
Glucose-Capillary: 69 mg/dL — ABNORMAL LOW (ref 70–99)
Glucose-Capillary: 82 mg/dL (ref 70–99)
Glucose-Capillary: 82 mg/dL (ref 70–99)

## 2019-01-27 SURGERY — ELECTROMAGNETIC NAVIGATION BRONCHOSCOPY
Anesthesia: General | Laterality: Right

## 2019-01-27 MED ORDER — LIDOCAINE HCL (CARDIAC) PF 100 MG/5ML IV SOSY
PREFILLED_SYRINGE | INTRAVENOUS | Status: DC | PRN
  Administered 2019-01-27 (×2): 100 mg via INTRAVENOUS

## 2019-01-27 MED ORDER — FAMOTIDINE 20 MG PO TABS: 20.0000 mg | ORAL_TABLET | Freq: Once | ORAL | Status: DC

## 2019-01-27 MED ORDER — PROPOFOL 10 MG/ML IV BOLUS
INTRAVENOUS | Status: AC
  Filled 2019-01-27: qty 20

## 2019-01-27 MED ORDER — MIDAZOLAM HCL 2 MG/2ML IJ SOLN
INTRAMUSCULAR | Status: AC
  Filled 2019-01-27: qty 2

## 2019-01-27 MED ORDER — DEXAMETHASONE SODIUM PHOSPHATE 10 MG/ML IJ SOLN
INTRAMUSCULAR | Status: DC | PRN
  Administered 2019-01-27: 4 mg via INTRAVENOUS

## 2019-01-27 MED ORDER — ONDANSETRON HCL 4 MG/2ML IJ SOLN
INTRAMUSCULAR | Status: DC | PRN
  Administered 2019-01-27: 4 mg via INTRAVENOUS

## 2019-01-27 MED ORDER — SODIUM CHLORIDE 0.9 % IV SOLN
INTRAVENOUS | Status: DC
  Administered 2019-01-27: 12:00:00 via INTRAVENOUS

## 2019-01-27 MED ORDER — FENTANYL CITRATE (PF) 100 MCG/2ML IJ SOLN: 25.0000 ug | INTRAMUSCULAR | Status: DC | PRN

## 2019-01-27 MED ORDER — SUGAMMADEX SODIUM 200 MG/2ML IV SOLN
INTRAVENOUS | Status: DC | PRN
  Administered 2019-01-27: 164.2 mg via INTRAVENOUS

## 2019-01-27 MED ORDER — ROCURONIUM BROMIDE 100 MG/10ML IV SOLN
INTRAVENOUS | Status: DC | PRN
  Administered 2019-01-27: 50 mg via INTRAVENOUS

## 2019-01-27 MED ORDER — FENTANYL CITRATE (PF) 100 MCG/2ML IJ SOLN
INTRAMUSCULAR | Status: DC | PRN
  Administered 2019-01-27: 50 ug via INTRAVENOUS

## 2019-01-27 MED ORDER — SUCCINYLCHOLINE CHLORIDE 20 MG/ML IJ SOLN
INTRAMUSCULAR | Status: AC
  Filled 2019-01-27: qty 1

## 2019-01-27 MED ORDER — PROPOFOL 10 MG/ML IV BOLUS
INTRAVENOUS | Status: DC | PRN
  Administered 2019-01-27: 100 mg via INTRAVENOUS

## 2019-01-27 MED ORDER — ONDANSETRON HCL 4 MG/2ML IJ SOLN
INTRAMUSCULAR | Status: AC
  Filled 2019-01-27: qty 2

## 2019-01-27 MED ORDER — ONDANSETRON HCL 4 MG/2ML IJ SOLN: 4.0000 mg | Freq: Once | INTRAMUSCULAR | Status: DC | PRN

## 2019-01-27 MED ORDER — ROCURONIUM BROMIDE 50 MG/5ML IV SOLN
INTRAVENOUS | Status: AC
  Filled 2019-01-27: qty 1

## 2019-01-27 MED ORDER — FENTANYL CITRATE (PF) 100 MCG/2ML IJ SOLN
INTRAMUSCULAR | Status: AC
  Filled 2019-01-27: qty 2

## 2019-01-27 NOTE — Anesthesia Postprocedure Evaluation (Signed)
Anesthesia Post Note  Patient: Crystal [name]  Procedure(s) Performed: ELECTROMAGNETIC NAVIGATION BRONCHOSCOPY (Right )  Patient location during evaluation: PACU Anesthesia Type: General Level of consciousness: awake and alert and oriented Pain management: pain level controlled Vital Signs Assessment: post-procedure vital signs reviewed and stable Respiratory status: spontaneous breathing, nonlabored ventilation and respiratory function stable Cardiovascular status: blood pressure returned to baseline and stable Postop Assessment: no signs of nausea or vomiting Anesthetic complications: no     Last Vitals:  Vitals:   01/27/19 1413 01/27/19 1425  BP: (!) 130/57 (!) 123/54  Pulse: (!) 56 61  Resp: 14 16  Temp:  36.7 C  SpO2: 100% 99%    Last Pain:  Vitals:   01/27/19 1425  TempSrc: Temporal  PainSc: 0-No pain                 Toivo Bordon

## 2019-01-27 NOTE — H&P (Signed)
CHIEF COMPLAINT:  ABNORMAL CT CHEST  Subjective  abnormal CT chest  2 lung nodules increased in size     Objective   Examination:  General exam: Appears calm and comfortable  Respiratory system: Clear to auscultation. Respiratory effort normal. HEENT: [place]/AT, PERRLA, no thrush, no stridor. Cardiovascular system: S1 & S2 heard, RRR. No JVD, murmurs, rubs, gallops or clicks. No pedal edema. Gastrointestinal system: Abdomen is nondistended, soft and nontender. No organomegaly or masses felt. Normal bowel sounds heard. Central nervous system: Alert and oriented. No focal neurological deficits. Extremities: Symmetric 5 x 5 power. Skin: No rashes, lesions or ulcers Psychiatry: Judgement and insight appear normal. Mood & affect appropriate.   VITALS:  height is 5' 3" (1.6 m) and weight is 82.1 kg. Her temporal temperature is 98.2 F (36.8 C). Her blood pressure is 149/65 (abnormal) and her pulse is 68. Her respiration is 18 and oxygen saturation is 98%.   I personally reviewed Labs under Results section.     Assessment/Plan:  LUNG NODULES  PLAN FOR ENB TODAY   The Risks and Benefits of the Bronchoscopy procedure with ENB were explained to patient/family.  I have discussed the risk for Acute Bleeding, increased chance of Infection, increased chance of Respiratory Failure and Cardiac Arrest, increased chance of pneumothorax and collapsed lung, as well as increased Stroke and Death.  I have also explained to avoid all types of NSAIDs 1 week prior to procedure date  to decrease chance of bleeding, and also to avoid food and drinks the midnight prior to procedure.  The procedure consists of a video camera with a light source to be placed and inserted  into the lungs to  look for abnormal tissue and to obtain tissue samples by using needle and biopsy tools.  The patient/family understand the risks and benefits and have agreed to proceed with procedure.

## 2019-01-27 NOTE — Anesthesia Procedure Notes (Signed)
Procedure Name: Intubation Date/Time: 01/27/2019 12:59 PM Performed by: Dionne Bucy, CRNA Pre-anesthesia Checklist: Patient identified, Patient being monitored, Timeout performed, Emergency Drugs available and Suction available Patient Re-evaluated:Patient Re-evaluated prior to induction Oxygen Delivery Method: Circle system utilized Preoxygenation: Pre-oxygenation with 100% oxygen Induction Type: IV induction Ventilation: Mask ventilation without difficulty Laryngoscope Size: Mac and 3 Grade View: Grade I Tube type: Oral Tube size: 8.5 mm Number of attempts: 1 Airway Equipment and Method: Stylet Placement Confirmation: ETT inserted through vocal cords under direct vision,  positive ETCO2 and breath sounds checked- equal and bilateral Secured at: 21 cm Tube secured with: Tape Dental Injury: Teeth and Oropharynx as per pre-operative assessment

## 2019-01-27 NOTE — Transfer of Care (Signed)
Immediate Anesthesia Transfer of Care Note  Patient: Crystal [name]  Procedure(s) Performed: ELECTROMAGNETIC NAVIGATION BRONCHOSCOPY (Right )  Patient Location: PACU  Anesthesia Type:General  Level of Consciousness: sedated  Airway & Oxygen Therapy: Patient Spontanous Breathing and Patient connected to nasal cannula oxygen  Post-op Assessment: Report given to RN and Post -op Vital signs reviewed and stable  Post vital signs: Reviewed and stable  Last Vitals:  Vitals Value Taken Time  BP 127/68 01/27/2019  1:53 PM  Temp    Pulse 66 01/27/2019  1:55 PM  Resp 13 01/27/2019  1:55 PM  SpO2 100 % 01/27/2019  1:55 PM  Vitals shown include unvalidated device data.  Last Pain:  Vitals:   01/27/19 1151  TempSrc: Temporal         Complications: No apparent anesthesia complications

## 2019-01-27 NOTE — Anesthesia Preprocedure Evaluation (Signed)
Anesthesia Evaluation  Patient identified by MRN, date of birth, ID band Patient awake    Reviewed: Allergy & Precautions, NPO status , Patient's Chart, lab work & pertinent test results  History of Anesthesia Complications Negative for: history of anesthetic complications  Airway Mallampati: II  TM Distance: >3 FB Neck ROM: Full    Dental  (+) Edentulous Lower, Missing, Poor Dentition   Pulmonary neg sleep apnea, neg COPD,  Lung mass   breath sounds clear to auscultation- rhonchi (-) wheezing      Cardiovascular (-) hypertension+ Peripheral Vascular Disease  (-) CAD, (-) Past MI, (-) Cardiac Stents and (-) CABG  Rhythm:Regular Rate:Normal - Systolic murmurs and - Diastolic murmurs    Neuro/Psych neg Seizures negative neurological ROS  negative psych ROS   GI/Hepatic PUD, (+) Cirrhosis       , Hepatitis -, C  Endo/Other  diabetes, Oral Hypoglycemic Agents  Renal/GU negative Renal ROS     Musculoskeletal  (+) Arthritis ,   Abdominal (+) + obese,   Peds  Hematology  (+) anemia ,   Anesthesia Other Findings Past Medical History: 02/09/2018: Acute GI bleeding 08/03/2018: Acute upper gastrointestinal bleeding No date: Anemia     Comment:  TAKES IRON TAB No date: Arthritis     Comment:  SHOULDER 02/18/2018: B12 deficiency No date: Bleeding     Comment:  GI  3/19 No date: Bronchitis     Comment:  HX OF No date: Cirrhosis (HCC) No date: Colon cancer screening No date: Diabetes mellitus without complication (HCC)     Comment:  TYPE 2 No date: Fatty (change of) liver, not elsewhere classified No date: Full dentures     Comment:  UPPER AND LOWER 02/18/2018: Iron deficiency anemia due to chronic blood loss No date: Lung nodule, multiple 02/22/2018: Pernicious anemia No date: PUD (peptic ulcer disease) No date: Raynaud's syndrome 08/03/2018: Upper GI bleeding   Reproductive/Obstetrics                              Anesthesia Physical Anesthesia Plan  ASA: IV  Anesthesia Plan: General   Post-op Pain Management:    Induction: Intravenous  PONV Risk Score and Plan: 2 and Ondansetron and Midazolam  Airway Management Planned: Oral ETT  Additional Equipment:   Intra-op Plan:   Post-operative Plan: Extubation in OR  Informed Consent: I have reviewed the patients History and Physical, chart, labs and discussed the procedure including the risks, benefits and alternatives for the proposed anesthesia with the patient or authorized representative who has indicated his/her understanding and acceptance.     Dental advisory given  Plan Discussed with: CRNA and Anesthesiologist  Anesthesia Plan Comments:         Anesthesia Quick Evaluation

## 2019-01-27 NOTE — Interval H&P Note (Signed)
History and Physical Interval Note:  01/27/2019 12:32 PM  Crystal [name]  has presented today for surgery, with the diagnosis of RIGHT MIDDLE LOBE MASS, RIGHT LOWER LOBE MASS.  The various methods of treatment have been discussed with the patient and family. After consideration of risks, benefits and other options for treatment, the patient has consented to  Procedure(s): ELECTROMAGNETIC NAVIGATION BRONCHOSCOPY (Right) as a surgical intervention.  The patient's history has been reviewed, patient examined, no change in status, stable for surgery.  I have reviewed the patient's chart and labs.  Questions were answered to the patient's satisfaction.     [name]

## 2019-01-27 NOTE — Anesthesia Post-op Follow-up Note (Signed)
Anesthesia QCDR form completed.

## 2019-01-27 NOTE — Discharge Instructions (Signed)
AMBULATORY SURGERY  DISCHARGE INSTRUCTIONS   1) The drugs that you were given will stay in your system until tomorrow so for the next 24 hours you should not:  A) Drive an automobile B) Make any legal decisions C) Drink any alcoholic beverage   2) You may resume regular meals tomorrow.  Today it is better to start with liquids and gradually work up to solid foods.  You may eat anything you prefer, but it is better to start with liquids, then soup and crackers, and gradually work up to solid foods.   3) Please notify your doctor immediately if you have any unusual bleeding, trouble breathing, redness and pain at the surgery site, drainage, fever, or pain not relieved by medication. 4)   5) Your post-operative visit with Dr.                                     is: Date:                        Time:    Please call to schedule your post-operative visit.  6) Additional Instructions:      Flexible Bronchoscopy, Care After This sheet gives you information about how to care for yourself after your test. Your doctor may also give you more specific instructions. If you have problems or questions, contact your doctor. Follow these instructions at home: Eating and drinking  Do not eat or drink anything (not even water) for 2 hours after your test, or until your numbing medicine (local anesthetic) wears off.  When your numbness is gone and your cough and gag reflexes have come back, you may: ? Eat only soft foods. ? Slowly drink liquids.  The day after the test, go back to your normal diet. Driving  Do not drive for 24 hours if you were given a medicine to help you relax (sedative).  Do not drive or use heavy machinery while taking prescription pain medicine. General instructions   Take over-the-counter and prescription medicines only as told by your doctor.  Return to your normal activities as told. Ask what activities are safe for you.  Do not use any products that have  nicotine or tobacco in them. This includes cigarettes and e-cigarettes. If you need help quitting, ask your doctor.  Keep all follow-up visits as told by your doctor. This is important. It is very important if you had a tissue sample (biopsy) taken. Get help right away if:  You have shortness of breath that gets worse.  You get light-headed.  You feel like you are going to pass out (faint).  You have chest pain.  You cough up: ? More than a little blood. ? More blood than before. Summary  Do not eat or drink anything (not even water) for 2 hours after your test, or until your numbing medicine wears off.  Do not use cigarettes. Do not use e-cigarettes.  Get help right away if you have chest pain. This information is not intended to replace advice given to you by your health care provider. Make sure you discuss any questions you have with your health care provider. Document Released: 08/27/2009 Document Revised: 11/17/2016 Document Reviewed: 11/17/2016 Elsevier Interactive Patient Education  2019 [employer].

## 2019-01-27 NOTE — Op Note (Signed)
Electromagnetic Navigation Bronchoscopy: Indication: lung mass/nodule  Preoperative Diagnosis:lung nodule/mass Post Procedure Diagnosis:lung nodule/mass Consent: Verbal/Written  The Risks and Benefits of the procedure explained to patient/family prior to start of procedure and I have discussed the risk for acute bleeding, increased chance of infection, increased chance of respiratory failure and cardiac arrest and death.  I have also explained to avoid all types of NSAIDs to decrease chance of bleeding, and to avoid food and drinks the midnight prior to procedure.  The procedure consists of a video camera with a light source to be placed and inserted  into the lungs to  look for abnormal tissue and to obtain tissue samples by using needle and biopsy tools.  The patient/family understand the risks and benefits and have agreed to proceed with procedure.   Hand washing performed prior to starting the procedure.   Type of Anesthesia: see Anesthesiology records .   Procedure Performed:  Virtual Bronchoscopy with Multi-planar Image analysis, 3-D reconstruction of coronal, sagittal and multi-planar images for the purposes of planning real-time bronchoscopy using the i[employer] Electromagnetic Navigation Bronchoscopy System (superDimension).  Description of Procedure: After obtaining informed consent from the patient, the above sedative and anesthetic measures were carried out, flexible fiberoptic bronchoscope was inserted via Endotracheal tube after patient was intubated by CNA/Anesthesiologist.   The virtual camera was then placed into the central portion of the trachea. The trachea itself was inspected.  The main carina, right and left midstem bronchus and all the segmental and subsegmental airways by virtual bronchoscopy were inspected. The camera was directed to standard registration points at the following centers: main carina, right upper lobe bronchus, right lower lobe bronchus, right middle  lobe bronchus, left upper lobe bronchus, and the left lower lobe bronchus. This data was transferred to the i-Logic ENB system for real-time bronchoscopy.   The scope was then navigated to the RUL AND RLL for tissue sampling  Specimans Obtained:RUL   Transbronchial Fine Needle Aspirations 21G times:2  Transbronchial Forceps Biopsy times:5  Transbronchial Single Needle Brush:3  Specimans Obtained:RLL   Transbronchial Forceps Biopsy times:7   Transbronchial Single Needle Brush:1   Fluoroscopy:  Fluoroscopy was utilized during the course of this procedure to assure that biopsies were taken in a safe manner under fluoroscopic guidance with spot films required.   Complications:None  Estimated Blood Loss: minimal approx 1cc  Monitoring:  The patient was monitored with continuous oximetry and received supplemental nasal cannula oxygen throughout the procedure. In addition, serial blood pressure measurements and continuous electrocardiography showed these physiologic parameters to remain tolerable throughout the procedure.   Assessment and Plan/Additional Comments: Follow up Pathology Reports    Corrin Parker, M.D.  [facility]  Medical Director [place] Director [facility] Cardio-Pulmonary Department

## 2019-01-28 ENCOUNTER — Encounter: Payer: Self-pay | Admitting: Internal Medicine

## 2019-01-28 LAB — SURGICAL PATHOLOGY

## 2019-01-28 LAB — CYTOLOGY - NON PAP

## 2019-01-29 ENCOUNTER — Telehealth: Payer: Self-pay | Admitting: Internal Medicine

## 2019-01-29 DIAGNOSIS — R911 Solitary pulmonary nodule: Secondary | ICD-10-CM

## 2019-01-29 NOTE — Telephone Encounter (Signed)
Called and spoke to pt.  Pt states she has reviewed bronch results via my chart. Pt questioned what Benign and Malignancy meant. I have advised pt that Benign is non cancerous and Malignancy is cancerous.  Pt is requesting a call to discuss results.  DK please advise.

## 2019-01-31 DIAGNOSIS — E119 Type 2 diabetes mellitus without complications: Secondary | ICD-10-CM | POA: Diagnosis not present

## 2019-01-31 DIAGNOSIS — M79644 Pain in right finger(s): Secondary | ICD-10-CM | POA: Diagnosis not present

## 2019-01-31 DIAGNOSIS — E669 Obesity, unspecified: Secondary | ICD-10-CM | POA: Diagnosis not present

## 2019-01-31 DIAGNOSIS — K922 Gastrointestinal hemorrhage, unspecified: Secondary | ICD-10-CM | POA: Diagnosis not present

## 2019-01-31 NOTE — Telephone Encounter (Signed)
Pt returning Dr. Zoila Shutter call. CB [phone]

## 2019-01-31 NOTE — Telephone Encounter (Signed)
Medication has been refilled and sent to pharmacy, pt has been notified

## 2019-01-31 NOTE — Telephone Encounter (Signed)
Medication has been sent to pharmacy and verbalized understanding

## 2019-01-31 NOTE — Telephone Encounter (Signed)
Patient called and results explained Will need follow up CT chest without contrast in 4 weeks

## 2019-02-02 DIAGNOSIS — R69 Illness, unspecified: Secondary | ICD-10-CM | POA: Diagnosis not present

## 2019-02-03 NOTE — Telephone Encounter (Signed)
CT has been ordered. Nothing further is needed.

## 2019-02-06 ENCOUNTER — Telehealth: Payer: Self-pay | Admitting: Internal Medicine

## 2019-02-06 NOTE — Telephone Encounter (Signed)
Pt's last CT Chest was 01/24/2019.  Insurance is requesting a Peer to Peer.  Contact information listed below.  Phone message sent to Dr. Mortimer Fries to see if he would like to proceed with peer to peer to obtain PA.   Rhonda J Cobb

## 2019-02-06 NOTE — Telephone Encounter (Signed)
ok

## 2019-02-06 NOTE — Telephone Encounter (Signed)
Will route to [place].

## 2019-02-08 ENCOUNTER — Other Ambulatory Visit: Payer: Self-pay | Admitting: Gastroenterology

## 2019-02-08 DIAGNOSIS — Z95828 Presence of other vascular implants and grafts: Secondary | ICD-10-CM

## 2019-02-08 DIAGNOSIS — L299 Pruritus, unspecified: Secondary | ICD-10-CM

## 2019-02-08 DIAGNOSIS — K7469 Other cirrhosis of liver: Secondary | ICD-10-CM

## 2019-02-11 ENCOUNTER — Other Ambulatory Visit: Payer: Self-pay

## 2019-02-11 NOTE — Telephone Encounter (Signed)
Peer to Peer information has been placed in your folder. Crystal [name]

## 2019-02-12 ENCOUNTER — Encounter: Payer: Self-pay | Admitting: Hematology and Oncology

## 2019-02-12 ENCOUNTER — Inpatient Hospital Stay (HOSPITAL_BASED_OUTPATIENT_CLINIC_OR_DEPARTMENT_OTHER): Payer: Medicare HMO | Admitting: Hematology and Oncology

## 2019-02-12 ENCOUNTER — Inpatient Hospital Stay: Payer: Medicare HMO | Attending: Hematology and Oncology

## 2019-02-12 ENCOUNTER — Other Ambulatory Visit: Payer: Self-pay

## 2019-02-12 ENCOUNTER — Inpatient Hospital Stay: Payer: Medicare HMO

## 2019-02-12 VITALS — BP 116/67 | HR 65 | Temp 97.5°F

## 2019-02-12 VITALS — BP 127/68 | HR 65 | Temp 97.5°F | Resp 16 | Wt 178.4 lb

## 2019-02-12 DIAGNOSIS — B1920 Unspecified viral hepatitis C without hepatic coma: Secondary | ICD-10-CM

## 2019-02-12 DIAGNOSIS — E538 Deficiency of other specified B group vitamins: Secondary | ICD-10-CM

## 2019-02-12 DIAGNOSIS — R911 Solitary pulmonary nodule: Secondary | ICD-10-CM

## 2019-02-12 DIAGNOSIS — Z7189 Other specified counseling: Secondary | ICD-10-CM

## 2019-02-12 DIAGNOSIS — D508 Other iron deficiency anemias: Secondary | ICD-10-CM

## 2019-02-12 DIAGNOSIS — K7460 Unspecified cirrhosis of liver: Secondary | ICD-10-CM

## 2019-02-12 DIAGNOSIS — D500 Iron deficiency anemia secondary to blood loss (chronic): Secondary | ICD-10-CM

## 2019-02-12 DIAGNOSIS — R188 Other ascites: Secondary | ICD-10-CM

## 2019-02-12 LAB — CBC WITH DIFFERENTIAL/PLATELET
Abs Immature Granulocytes: 0.01 10*3/uL (ref 0.00–0.07)
Basophils Absolute: 0 10*3/uL (ref 0.0–0.1)
Basophils Relative: 1 %
Eosinophils Absolute: 0.4 10*3/uL (ref 0.0–0.5)
Eosinophils Relative: 8 %
HCT: 36.8 % (ref 36.0–46.0)
Hemoglobin: 12.4 g/dL (ref 12.0–15.0)
Immature Granulocytes: 0 %
Lymphocytes Relative: 24 %
Lymphs Abs: 1.1 10*3/uL (ref 0.7–4.0)
MCH: 29.5 pg (ref 26.0–34.0)
MCHC: 33.7 g/dL (ref 30.0–36.0)
MCV: 87.4 fL (ref 80.0–100.0)
Monocytes Absolute: 0.4 10*3/uL (ref 0.1–1.0)
Monocytes Relative: 9 %
Neutro Abs: 2.6 10*3/uL (ref 1.7–7.7)
Neutrophils Relative %: 58 %
Platelets: 128 10*3/uL — ABNORMAL LOW (ref 150–400)
RBC: 4.21 MIL/uL (ref 3.87–5.11)
RDW: 16.4 % — ABNORMAL HIGH (ref 11.5–15.5)
WBC: 4.5 10*3/uL (ref 4.0–10.5)
nRBC: 0 % (ref 0.0–0.2)

## 2019-02-12 LAB — FERRITIN: Ferritin: 35 ng/mL (ref 11–307)

## 2019-02-12 MED ORDER — CYANOCOBALAMIN 1000 MCG/ML IJ SOLN
1000.0000 ug | Freq: Once | INTRAMUSCULAR | Status: AC
  Administered 2019-02-12: 1000 ug via INTRAMUSCULAR

## 2019-02-12 NOTE — Progress Notes (Signed)
Pt here for follow up. Denies any concerns at this time.

## 2019-02-12 NOTE — Progress Notes (Signed)
[facility]     [address]     [place], [place] [address]     Phone: [phone]      Fax: [phone]        Clinic day:  02/12/2019  Chief Complaint: Crystal [name] is a 68 y.o. female with 2 right sided pulmonary nodules and lymphadenopathy who seen for 3 month assessment.  HPI:  The patient was last seen in the medical oncology clinic on 11/20/2018.  At that time, she felt "ok".  She tired easily.  Exam revealed chronic lower extremity edema and stigmata of liver disease.  We discussed concern for the RLL lesion.  We discussed repeating her chest CT and following up with Dr. Mortimer Fries.  Labs on 11/20/2018 revealed a hematocrit of 39.9, hemoglobin 12.6, MCV 87.1, platelets 105,000, white count 3900 with an ANC of 2400.  Ferritin 63.  Folate 26.  Albumin 2.8.  Bilirubin 1.5, AST 92, ALT 43, alkaline phosphatase 212.  AFP was 2.4.  Chest CT on 12/16/2018 revealed enlarging right middle lobe and right lower lobe spiculated pulmonary lesions highly worrisome for synchronous lung cancers. Recommend biopsy.  There were no new pulmonary lesions or acute pulmonary findings.  There were no enlarged mediastinal or hilar lymph nodes.  Super D chest CT on 01/24/2019 revealed stable right pulmonary nodules. One of these was in the right lower lobe (2.0 x 2.0 cm). The second, more anterior nodule (3.2 x 2.3 cm) was in the region of the anterior minor fissure.  She underwent bronchoscopy (ENB) by Dr Mortimer Fries on 01/27/2019.  RLL brushings and biopsy were non-diagnostic with benign bronchial epithelial cells.  Plan was for follow-up CT scan in 4 weeks.  During the interim, she has felt "ok".   She only relates symptoms due to her underlying liver disease.  She continues lactulose.  Weight is stable.   Past Medical History:  Diagnosis Date   Acute GI bleeding 02/09/2018   Acute upper gastrointestinal bleeding 08/03/2018   Anemia    TAKES IRON TAB   Arthritis    SHOULDER   B12 deficiency 02/18/2018   Bleeding    GI  3/19   Bronchitis    HX OF   Cirrhosis ([place])    Colon cancer screening    Diabetes mellitus without complication ([place])    TYPE 2   Fatty (change of) liver, not elsewhere classified    Full dentures    UPPER AND LOWER   Iron deficiency anemia due to chronic blood loss 02/18/2018   Lung nodule, multiple    Pernicious anemia 02/22/2018   PUD (peptic ulcer disease)    Raynaud's syndrome    Upper GI bleeding 08/03/2018    Past Surgical History:  Procedure Laterality Date   CATARACT EXTRACTION W/PHACO Right 02/06/2018   Procedure: CATARACT EXTRACTION PHACO AND INTRAOCULAR LENS PLACEMENT ([place]) RIGHT DIABETIC;  Surgeon: Leandrew Koyanagi, MD;  Location: [facility];  Service: Ophthalmology;  Laterality: Right;  DIABETIC, ORAL MED   CATARACT EXTRACTION W/PHACO Left 04/30/2018   Procedure: CATARACT EXTRACTION PHACO AND INTRAOCULAR LENS PLACEMENT ([facility]);  Surgeon: Leandrew Koyanagi, MD;  Location: [facility]S;  Service: Ophthalmology;  Laterality: Left;  [place] 01:04 AP% 21.3 CDE 13.66 Fluid pack lot # [id number]   COLONOSCOPY WITH PROPOFOL N/A 03/25/2018   Procedure: COLONOSCOPY WITH PROPOFOL;  Surgeon: Lin Landsman, MD;  Location: [facility] ENDOSCOPY;  Service: Gastroenterology;  Laterality: N/A;   DENTAL SURGERY  EXTRACTIONS   ELECTROMAGNETIC NAVIGATION BROCHOSCOPY Right 01/27/2019   Procedure: ELECTROMAGNETIC NAVIGATION BRONCHOSCOPY;  Surgeon: [name], MD;  Location: [facility];  Service: Cardiopulmonary;  Laterality: Right;   ESOPHAGOGASTRODUODENOSCOPY N/A 08/03/2018   Procedure: ESOPHAGOGASTRODUODENOSCOPY (EGD);  Surgeon: Lin Landsman, MD;  Location: [facility] ENDOSCOPY;  Service: Gastroenterology;  Laterality: N/A;   ESOPHAGOGASTRODUODENOSCOPY (EGD) WITH PROPOFOL N/A 02/10/2018   Procedure: ESOPHAGOGASTRODUODENOSCOPY (EGD) WITH PROPOFOL;  Surgeon: Jonathon Bellows, MD;  Location: [facility] ENDOSCOPY;  Service:  Gastroenterology;  Laterality: N/A;   ESOPHAGOGASTRODUODENOSCOPY (EGD) WITH PROPOFOL N/A 03/25/2018   Procedure: ESOPHAGOGASTRODUODENOSCOPY (EGD) WITH PROPOFOL;  Surgeon: Lin Landsman, MD;  Location: [facility] ENDOSCOPY;  Service: Gastroenterology;  Laterality: N/A;   ESOPHAGOGASTRODUODENOSCOPY (EGD) WITH PROPOFOL N/A 04/22/2018   Procedure: ESOPHAGOGASTRODUODENOSCOPY (EGD) WITH PROPOFOL with band ligation;  Surgeon: Lin Landsman, MD;  Location: [facility];  Service: Gastroenterology;  Laterality: N/A;   ESOPHAGOGASTRODUODENOSCOPY (EGD) WITH PROPOFOL N/A 06/24/2018   Procedure: ESOPHAGOGASTRODUODENOSCOPY (EGD) WITH PROPOFOL;  Surgeon: Lin Landsman, MD;  Location: [facility] ENDOSCOPY;  Service: Gastroenterology;  Laterality: N/A;   ESOPHAGOGASTRODUODENOSCOPY (EGD) WITH PROPOFOL N/A 11/19/2018   Procedure: ESOPHAGOGASTRODUODENOSCOPY (EGD) WITH PROPOFOL;  Surgeon: Lin Landsman, MD;  Location: [facility];  Service: Gastroenterology;  Laterality: N/A;   EYE SURGERY     HEMORRHOID BANDING  03/25/2018   Procedure: HEMORRHOID BANDING;  Surgeon: Lin Landsman, MD;  Location: [facility] ENDOSCOPY;  Service: Gastroenterology;;   IR EMBO ART  VEN HEMORR LYMPH EXTRAV  INC GUIDE ROADMAPPING  08/07/2018   IR RADIOLOGIST EVAL & MGMT  09/03/2018   IR RADIOLOGIST EVAL & MGMT  12/17/2018   IR TIPS  08/07/2018   RADIOLOGY WITH ANESTHESIA N/A 08/07/2018   Procedure: TIPS;  Surgeon: Corrie Mckusick, DO;  Location: [facility];  Service: Anesthesiology;  Laterality: N/A;   TONSILLECTOMY      Family History  Problem Relation Age of Onset   CAD Father    Heart attack Father 86   Cancer Maternal Uncle    Seizures Mother     Social History:  reports that she has never smoked. She has never used smokeless tobacco. She reports previous alcohol use. She reports that she does not use drugs.  Patient denies known exposures to radiation on toxins. She retired from [employer] in 11/2017.  The  patient is alone today.  Allergies: No Known Allergies  Current Medications: Current Outpatient Medications  Medication Sig Dispense Refill   cyanocobalamin (,VITAMIN B-12,) 1000 MCG/ML injection Inject 1,000 mcg into the muscle every 30 (thirty) days.      furosemide (LASIX) 20 MG tablet TAKE 2 TABLETS BY MOUTH EVERY DAY 180 tablet 1   metFORMIN (GLUCOPHAGE) 500 MG tablet Take 1,000 mg by mouth 2 (two) times daily with a meal.      nystatin (MYCOSTATIN/NYSTOP) powder Apply 1 g topically as needed (rash).      ondansetron (ZOFRAN) 4 MG tablet Take 1 tablet (4 mg total) by mouth every 6 (six) hours as needed for nausea. 20 tablet 0   ONE TOUCH ULTRA TEST test strip 2 (two) times daily.  5   ONETOUCH DELICA LANCETS 81X MISC 2 (two) times daily.  5   pantoprazole (PROTONIX) 40 MG tablet Take 1 tablet (40 mg total) by mouth 2 (two) times daily. 60 tablet 0   spironolactone (ALDACTONE) 50 MG tablet Take 1 tablet (50 mg total) by mouth daily. 180 tablet 0   hydrOXYzine (ATARAX/VISTARIL) 10 MG tablet TAKE 1 TABLET (10 MG TOTAL) BY  MOUTH 3 (THREE) TIMES DAILY AS NEEDED FOR ITCHING. 30 tablet 0   lactulose (CHRONULAC) 10 GM/15ML solution Take 20 g by mouth 2 (two) times a day.      XIFAXAN 550 MG TABS tablet TAKE 1 TABLET (550 MG TOTAL) BY MOUTH 2 (TWO) TIMES DAILY. 60 tablet 5   No current facility-administered medications for this visit.     Review of Systems  Constitutional: Negative for chills, diaphoresis, fever, malaise/fatigue and weight loss (stable).       Doing "ok".  HENT: Negative.  Negative for congestion, ear pain, nosebleeds, sinus pain and sore throat.   Eyes: Negative.  Negative for blurred vision, double vision, photophobia and pain.       S/p cataract surgery.  Respiratory: Negative.  Negative for cough, hemoptysis, sputum production and shortness of breath.   Cardiovascular: Positive for leg swelling. Negative for chest pain, orthopnea and PND.  Gastrointestinal:  Negative for abdominal pain, blood in stool, constipation, diarrhea (lactulose causes loose stools), heartburn, melena, nausea and vomiting.       Portal hypertensive gastropathy s/p variceal ligation.  Genitourinary: Negative.  Negative for dysuria, frequency, hematuria and urgency.  Musculoskeletal: Negative.  Negative for back pain, falls, joint pain, myalgias and neck pain.  Skin: Negative.  Negative for itching and rash.  Neurological: Negative.  Negative for dizziness, sensory change, speech change, focal weakness, weakness and headaches.  Endo/Heme/Allergies: Does not bruise/bleed easily.       Diabetes on Metformin.  Psychiatric/Behavioral: Negative.  Negative for depression and memory loss. The patient is not nervous/anxious and does not have insomnia.   All other systems reviewed and are negative.  Performance status (ECOG): 1  Vital Signs BP 127/68 (BP Location: Left Arm, Patient Position: Sitting)    Pulse 65    Temp (!) 97.5 F (36.4 C) (Temporal)    Resp 16    Wt 178 lb 5.6 oz (80.9 kg)    LMP  (LMP Unknown)    BMI 31.59 kg/m   Physical Exam  Constitutional: She is oriented to person, place, and time and well-developed, well-nourished, and in no distress. No distress.  HENT:  Head: Normocephalic and atraumatic.  Mouth/Throat: Oropharynx is clear and moist and mucous membranes are normal. No oropharyngeal exudate.  Gray hair.  Eyes: Pupils are equal, round, and reactive to light. Conjunctivae and EOM are normal. No scleral icterus.  Glasses.  Blue eyes.  Neck: Normal range of motion. Neck supple. No JVD present.  Cardiovascular: Normal rate, regular rhythm and normal heart sounds. Exam reveals no gallop and no friction rub.  No murmur heard. Pulmonary/Chest: Effort normal and breath sounds normal. No respiratory distress. She has no wheezes. She has no rales.  Abdominal: Soft. Bowel sounds are normal. She exhibits no distension and no mass. There is no abdominal tenderness.  There is no rebound and no guarding.  Musculoskeletal: Normal range of motion.        General: Edema (chronic lower extremity) present. No tenderness.  Lymphadenopathy:    She has no cervical adenopathy.    She has no axillary adenopathy.       Right: No supraclavicular adenopathy present.       Left: No supraclavicular adenopathy present.  Neurological: She is alert and oriented to person, place, and time.  Skin: Skin is warm and dry. No rash noted. She is not diaphoretic. No erythema. No pallor.  Telangectasias lip and face.  Psychiatric: Mood, affect and judgment normal.  Nursing note and  vitals reviewed.   Appointment on 02/12/2019  Component Date Value Ref Range Status   Ferritin 02/12/2019 35  11 - 307 ng/mL Final   Performed at [facility], [place], [place] [address]   WBC 02/12/2019 4.5  4.0 - 10.5 K/uL Final   RBC 02/12/2019 4.21  3.87 - 5.11 MIL/uL Final   Hemoglobin 02/12/2019 12.4  12.0 - 15.0 g/dL Final   HCT 02/12/2019 36.8  36.0 - 46.0 % Final   MCV 02/12/2019 87.4  80.0 - 100.0 fL Final   MCH 02/12/2019 29.5  26.0 - 34.0 pg Final   MCHC 02/12/2019 33.7  30.0 - 36.0 g/dL Final   RDW 02/12/2019 16.4* 11.5 - 15.5 % Final   Platelets 02/12/2019 128* 150 - 400 K/uL Final   Comment: Immature Platelet Fraction may be clinically indicated, consider ordering this additional test [id number]    nRBC 02/12/2019 0.0  0.0 - 0.2 % Final   Neutrophils Relative % 02/12/2019 58  % Final   Neutro Abs 02/12/2019 2.6  1.7 - 7.7 K/uL Final   Lymphocytes Relative 02/12/2019 24  % Final   Lymphs Abs 02/12/2019 1.1  0.7 - 4.0 K/uL Final   Monocytes Relative 02/12/2019 9  % Final   Monocytes Absolute 02/12/2019 0.4  0.1 - 1.0 K/uL Final   Eosinophils Relative 02/12/2019 8  % Final   Eosinophils Absolute 02/12/2019 0.4  0.0 - 0.5 K/uL Final   Basophils Relative 02/12/2019 1  % Final   Basophils Absolute 02/12/2019 0.0  0.0 - 0.1 K/uL Final    Immature Granulocytes 02/12/2019 0  % Final   Abs Immature Granulocytes 02/12/2019 0.01  0.00 - 0.07 K/uL Final   Performed at [facility], [address] [place], [place] [address]    Assessment:  [place] is a 68 y.o. female with a right lower lobe pulmonary nodule.  She denies any smoking history.  Abdomen and pelvic CT on 02/09/2018 revealed an 1.8 cm spiculated density in right lower lobe concerning for malignancy. There was mildly nodular hepatic contour concerning for hepatic cirrhosis.  There was moderate splenomegaly suggesting portal venous hypertension.  Mild ascites was noted.  There were mildly enlarged retroperitoneal lymph nodes (largest 1.1 cm) concerning for possible metastatic disease or malignancy.  There was a 7 cm left ovarian cyst.  Further evaluation with MRI was recommended to evaluate for possible neoplasm.  CA125 was 351.3 and CEA 1.1 on 02/21/2018.   Chest CT on 02/25/2018 revealed a 2.3 x 1.5 x 1.0 cm perifissural nodule with spiculated margins in the right middle lobe.  There was a 1.5 x 1.5 x 1.4 cm right lower lobe spiculated nodule.  There was a moderate left sided pleural effusion.    PET scan on 03/08/2018 revealed a 2.5 x 1.8 cm sub solid nodular opacity in the RML (SUV 2.7) and a 1.6 cm rounded slightly spiculated RLL pulmonary nodule (SUV 2.1).  There were no enlarged or hypermetabolic mediastinal or hilar lymph nodes.  There was a small left pleural effusion.  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  Chest CT on 05/15/2018 revealed no significant changes in the 2 previously noted nodules in the RIGHT lung. Irregular RML lesion measures 2.4 x 1.4 cm (previously 2.3 x 1.5 cm). Spiculated central RLL lesion measures 1.8 x 1.4 cm (previously 1.6 x 1.5 cm). Both areas remain concerning for neoplasm. There was interval decrease  in ascites volume overall. Upper  abdominal lymphadenopathy noted that was felt to be reactive due to underlying liver disease.    Chest CT on 08/15/2018 revealed the spiculated right lower lobe lung lesion appeared slightly increased and was not significantly changed in size compared with previous exam.  Morphologically, this was worrisome for a small bronchogenic neoplasm.  The right middle lobe lung nodule was slightly decreased in size in the interval and may be post infectious or inflammatory in etiology.  She was admitted to [facility] from 02/09/2018 - 02/12/2018 with a variceal hemorrhage.  EGD on 02/10/2018 revealed 5 columns of grade III esophageal varices in the lower third of the esophagus.  There was stigmata of recent bleeding and red wale signs.  She underwent variceal ligation x 10.   She required 3 units of PRBCs.  EGD on 03/25/2018 revealed 2 angiectasias (non-bleeding) in the second portion of the duodenum, and a few diminutive (non-bleeding) angiectasias in the prepyloric region of the stomach that were treated APC. There were 4 non-bleeding ulcers (Forrest Class III)  found in the gastric fundus. There were 4 columns of large non-bleeding varices in the lower third of the esophagus, of which demonstrated no stigmata of bleeding.  Varices were banded.  EGD on 04/22/2018  revealed one non-bleeding cratered gastric ulcer (Forrest Class III) in the lesser curvature of the gastric body. Duodenal bulb and second portion of the duodenum were normal. Moderate portal hypertensive gastropathy noted in the stomach. Large varices in the lower third of the esophagus noted. 2 bands were placed with incomplete eradication of the lesions.   Colonoscopy on 03/25/2018 revealed a single 5 mm sessile polyp in the ascending colon. Patient has rectal varices and external hemorrhoids. Pathology returned as tubular adenoma, and was negative for high grade dysplasia and malignancy.   She has iron deficiency anemia.  Ferritin was 6.  She is on  ferrous sulfate 325 mg TID.  She has B12 deficiency.  B12 was 103.   Intrinsic factor antibody was negative.  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  She began B12 injections on 03/06/2018 (last 01/15/2019).  Folate was 14.3 on 02/09/2018.  B12 was 485 on 05/24/2018.  Diet is good.  She received Venofer weekly x 4 (07/12/2018 - 07/31/2018).    Ferritin has been followed: 6 on 02/09/2018, 16 on 05/24/2018, 13 on 07/10/2018, and 374 on 08/15/2018.  She has hepatitis C antibodies.  HCV RNA was not detected. She has been exposed to hepatitis B.  Anti-smooth muscle antibodies are weakly positive.  ANA was positive (> 1:1280 centromere antibody).  Negative studies included: ceruloplasmin, anti-mitochondrial antibodies, hepatitis B IgM, HIV, and alpha-1 antitrypsin.  PT was 15.2 (INR 1.21).  AFP was 1.5 on 05/28/2018 and 2.4 on 11/20/2018.  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  She has portal hypertension and a history of recurrent bleeding esophageal varices.  RUQ ultrasound on 05/29/2018 revealed cholelithiasis. There was a thickened edematous gallbladder wall. The wall thickening could be due to hypoproteinemia/hypoalbuminemia or cholecystitis. Negative sonographic Murphy's sign.  There was a heterogeneous slightly nodular liver consistent with cirrhosis.  Pelvic ultrasound  on 03/18/2018  demonstrated a simple cyst in the LEFT ovary measuring 6.1 x 4.5 x 6.6 cm. Repeat imaging recommended in 1 year to document stability.   Symptomatically, she feels "ok".  She denies any respiratory symptoms.  Exam is stable.  Plan: 1. Review interval labs. 2. Decompensated cirrhosis s/p TIPS  Patient appears to be doing well s/p TIPS procedure.  Patient denies any recurrent bleeding. Continue AFP checks every 6 months (last 11/20/2018). Continue close monitoring by GI. 3. Pulmonary nodules Review Super D chest CT from 01/24/2019.  Agree  with radiology interpretation. Concern remains for synchronous primaries in right lung. ENB on 01/27/2019 was non-diagnostic. Discuss plan for repeat chest CT in month and likely re-attempt at biopsy. 4. Iron deficiency anemia Hematocrit 36.8.  Hemoglobin 12.4.  MCV 87.4 Ferritin 45. No  Venofer today. 5. B12 deficiency Patient receives monthly B12 (last 01/15/2019).  B12 today and monthly x 5 6.   RTC in 3 months for MD assessment, labs (CBC with diff, ferritin- day before) and +/- Venofer.   I discussed the assessment and treatment plan with the patient.  The patient was provided an opportunity to ask questions and all were answered.  The patient agreed with the plan and demonstrated an understanding of the instructions.  The patient was advised to call back or seek an in person evaluation if the symptoms worsen or if the condition fails to improve as anticipated.    Lequita Asal, MD  02/12/2019, 4:35 PM

## 2019-02-14 DIAGNOSIS — A4901 Methicillin susceptible Staphylococcus aureus infection, unspecified site: Secondary | ICD-10-CM | POA: Diagnosis not present

## 2019-02-14 DIAGNOSIS — L0390 Cellulitis, unspecified: Secondary | ICD-10-CM | POA: Diagnosis not present

## 2019-02-15 DIAGNOSIS — R69 Illness, unspecified: Secondary | ICD-10-CM | POA: Diagnosis not present

## 2019-02-22 ENCOUNTER — Other Ambulatory Visit: Payer: Self-pay | Admitting: Gastroenterology

## 2019-02-22 DIAGNOSIS — K7469 Other cirrhosis of liver: Secondary | ICD-10-CM

## 2019-02-22 DIAGNOSIS — K7290 Hepatic failure, unspecified without coma: Secondary | ICD-10-CM

## 2019-02-22 DIAGNOSIS — K7682 Hepatic encephalopathy: Secondary | ICD-10-CM

## 2019-03-10 ENCOUNTER — Telehealth: Payer: Self-pay | Admitting: Gastroenterology

## 2019-03-10 NOTE — Telephone Encounter (Signed)
Patient called in about her furosemide (LASIX) 20 MG tablet that is almost running out. She has called her pharmacy but she is not due for a refill. She is running short because when she had the fluid she was ask to take 2 in the am & 2 in the pm. Is there anything we can do to help her?

## 2019-03-10 NOTE — Telephone Encounter (Signed)
Refilled has been called in to pharmacy, pt has been notified and verbalized understanding

## 2019-03-11 ENCOUNTER — Ambulatory Visit: Payer: Medicare HMO | Admitting: Gastroenterology

## 2019-03-12 ENCOUNTER — Other Ambulatory Visit: Payer: Self-pay

## 2019-03-12 ENCOUNTER — Inpatient Hospital Stay: Payer: Medicare HMO

## 2019-03-12 VITALS — BP 117/65 | HR 75 | Temp 97.9°F | Resp 18

## 2019-03-12 DIAGNOSIS — D508 Other iron deficiency anemias: Secondary | ICD-10-CM | POA: Diagnosis not present

## 2019-03-12 DIAGNOSIS — E538 Deficiency of other specified B group vitamins: Secondary | ICD-10-CM | POA: Diagnosis not present

## 2019-03-12 MED ORDER — CYANOCOBALAMIN 1000 MCG/ML IJ SOLN
1000.0000 ug | Freq: Once | INTRAMUSCULAR | Status: AC
  Administered 2019-03-12: 1000 ug via INTRAMUSCULAR

## 2019-03-13 DIAGNOSIS — E669 Obesity, unspecified: Secondary | ICD-10-CM | POA: Diagnosis not present

## 2019-03-13 DIAGNOSIS — K922 Gastrointestinal hemorrhage, unspecified: Secondary | ICD-10-CM | POA: Diagnosis not present

## 2019-03-13 DIAGNOSIS — E119 Type 2 diabetes mellitus without complications: Secondary | ICD-10-CM | POA: Diagnosis not present

## 2019-03-13 DIAGNOSIS — R5383 Other fatigue: Secondary | ICD-10-CM | POA: Diagnosis not present

## 2019-03-13 DIAGNOSIS — E7800 Pure hypercholesterolemia, unspecified: Secondary | ICD-10-CM | POA: Diagnosis not present

## 2019-03-13 DIAGNOSIS — J449 Chronic obstructive pulmonary disease, unspecified: Secondary | ICD-10-CM | POA: Diagnosis not present

## 2019-03-20 ENCOUNTER — Telehealth: Payer: Self-pay | Admitting: Gastroenterology

## 2019-03-20 NOTE — Telephone Encounter (Signed)
Pt left vm she states the rx that was called in 2 weeks ago she is unable to pick up until 03/22/19 and she wants to see if we can increase her 90 day supply to 3 a day so she will not go without again

## 2019-03-25 NOTE — Telephone Encounter (Signed)
Pt has received medication

## 2019-03-27 ENCOUNTER — Telehealth: Payer: Self-pay | Admitting: Gastroenterology

## 2019-03-27 NOTE — Telephone Encounter (Signed)
Received fax from [facility] notice of Approval of medicare prescription Drug coverage. This lerrter is to inform you that we have approved coverage for Xifaxan,ref#[id number] EFFT:11-11-2018,expiration:09-26-2019.  Fax in box.

## 2019-03-27 NOTE — Telephone Encounter (Signed)
Approval received, pt has been notified

## 2019-03-31 DIAGNOSIS — R69 Illness, unspecified: Secondary | ICD-10-CM | POA: Diagnosis not present

## 2019-04-02 ENCOUNTER — Other Ambulatory Visit: Payer: Self-pay

## 2019-04-02 ENCOUNTER — Ambulatory Visit (INDEPENDENT_AMBULATORY_CARE_PROVIDER_SITE_OTHER): Payer: Medicare HMO | Admitting: Internal Medicine

## 2019-04-02 ENCOUNTER — Encounter: Payer: Self-pay | Admitting: Internal Medicine

## 2019-04-02 VITALS — BP 109/71 | HR 63 | Ht 63.0 in | Wt 174.8 lb

## 2019-04-02 DIAGNOSIS — I700 Atherosclerosis of aorta: Secondary | ICD-10-CM

## 2019-04-02 DIAGNOSIS — I2510 Atherosclerotic heart disease of native coronary artery without angina pectoris: Secondary | ICD-10-CM | POA: Insufficient documentation

## 2019-04-02 DIAGNOSIS — R69 Illness, unspecified: Secondary | ICD-10-CM | POA: Diagnosis not present

## 2019-04-02 DIAGNOSIS — E119 Type 2 diabetes mellitus without complications: Secondary | ICD-10-CM | POA: Diagnosis not present

## 2019-04-02 DIAGNOSIS — K7460 Unspecified cirrhosis of liver: Secondary | ICD-10-CM | POA: Diagnosis not present

## 2019-04-02 DIAGNOSIS — I2584 Coronary atherosclerosis due to calcified coronary lesion: Secondary | ICD-10-CM

## 2019-04-02 DIAGNOSIS — I5032 Chronic diastolic (congestive) heart failure: Secondary | ICD-10-CM

## 2019-04-02 DIAGNOSIS — I5033 Acute on chronic diastolic (congestive) heart failure: Secondary | ICD-10-CM | POA: Insufficient documentation

## 2019-04-02 DIAGNOSIS — R188 Other ascites: Secondary | ICD-10-CM

## 2019-04-02 NOTE — Patient Instructions (Signed)
Medication Instructions:  Your physician recommends that you continue on your current medications as directed. Please refer to the Current Medication list given to you today.  If you need a refill on your cardiac medications before your next appointment, please call your pharmacy.   Lab work: none If you have labs (blood work) drawn today and your tests are completely normal, you will receive your results only by: Marland Kitchen MyChart Message (if you have MyChart) OR . A paper copy in the mail If you have any lab test that is abnormal or we need to change your treatment, we will call you to review the results.  Testing/Procedures: Your physician has requested that you have an echocardiogram. Echocardiography is a painless test that uses sound waves to create images of your heart. It provides your doctor with information about the size and shape of your heart and how well your heart's chambers and valves are working. This procedure takes approximately one hour. There are no restrictions for this procedure. You may get an IV, if needed, to receive an ultrasound enhancing agent through to better visualize your heart.    Follow-Up: At [facility], you and your health needs are our priority.  As part of our continuing mission to provide you with exceptional heart care, we have created designated Provider Care Teams.  These Care Teams include your primary Cardiologist (physician) and Advanced Practice Providers (APPs -  Physician Assistants and Nurse Practitioners) who all work together to provide you with the care you need, when you need it. You will need a follow up appointment in 6 months.  Please call our office 2 months in advance to schedule this appointment.  You may see DR Harrell Gave END or one of the following Advanced Practice Providers on your designated Care Team:   Murray Hodgkins, NP Christell Faith, PA-C . Marrianne Mood, PA-C      Echocardiogram An echocardiogram is a procedure that  uses painless sound waves (ultrasound) to produce an image of the heart. Images from an echocardiogram can provide important information about:  Signs of coronary artery disease (CAD).  Aneurysm detection. An aneurysm is a weak or damaged part of an artery wall that bulges out from the normal force of blood pumping through the body.  Heart size and shape. Changes in the size or shape of the heart can be associated with certain conditions, including heart failure, aneurysm, and CAD.  Heart muscle function.  Heart valve function.  Signs of a past heart attack.  Fluid buildup around the heart.  Thickening of the heart muscle.  A tumor or infectious growth around the heart valves. Tell a health care provider about:  Any allergies you have.  All medicines you are taking, including vitamins, herbs, eye drops, creams, and over-the-counter medicines.  Any blood disorders you have.  Any surgeries you have had.  Any medical conditions you have.  Whether you are pregnant or may be pregnant. What are the risks? Generally, this is a safe procedure. However, problems may occur, including:  Allergic reaction to dye (contrast) that may be used during the procedure. What happens before the procedure? No specific preparation is needed. You may eat and drink normally. What happens during the procedure?   An IV tube may be inserted into one of your veins.  You may receive contrast through this tube. A contrast is an injection that improves the quality of the pictures from your heart.  A gel will be applied to your chest.  A  wand-like tool (transducer) will be moved over your chest. The gel will help to transmit the sound waves from the transducer.  The sound waves will harmlessly bounce off of your heart to allow the heart images to be captured in real-time motion. The images will be recorded on a computer. The procedure may vary among health care providers and hospitals. What happens  after the procedure?  You may return to your normal, everyday life, including diet, activities, and medicines, unless your health care provider tells you not to do that. Summary  An echocardiogram is a procedure that uses painless sound waves (ultrasound) to produce an image of the heart.  Images from an echocardiogram can provide important information about the size and shape of your heart, heart muscle function, heart valve function, and fluid buildup around your heart.  You do not need to do anything to prepare before this procedure. You may eat and drink normally.  After the echocardiogram is completed, you may return to your normal, everyday life, unless your health care provider tells you not to do that. This information is not intended to replace advice given to you by your health care provider. Make sure you discuss any questions you have with your health care provider. Document Released: 10/27/2000 Document Revised: 12/02/2016 Document Reviewed: 12/02/2016 Elsevier Interactive Patient Education  2019 [employer].

## 2019-04-02 NOTE — Progress Notes (Signed)
New Outpatient Visit Date: 04/02/2019  Referring Provider: Lin Landsman, MD [address] [place], [place] [address]  Chief Complaint: Fatigue  HPI:  Crystal [name] is a 68 y.o. female who is being seen today for the evaluation of diastolic dysfunction at the request of Dr. Marius Ditch. 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  She was seen by Dr. Marius Ditch in late January, at which time fluid retention was noted.  There was concern that diastolic dysfunction may have worsened with placement of TIPS last year.  Subsequent liver Doppler showed unremarkable TIPS without evidence of occlusion, in-stent stenosis, or recurrent ascites.  In January, Crystal [name] was noted to have significant lower extremity edema and weight gain.  This has since resolved with temporary escalation of furosemide and spironolactone.  She is back to her baseline dose of furosemide 20 mg twice daily and spironolactone 50 mg daily.  Crystal [name] denies a history of prior heart disease.  She has not had any chest pain, shortness of breath, orthopnea, or PND.  She has minimal leg swelling at this time.  She gets fatigued rather easily when she does moderate activity in her yard or work around the house.  It also seems like her heart beats a little bit faster when she becomes fatigued.  Other than echocardiogram shortly before TIPS procedure in September, Crystal [name] has not undergone any prior cardiac testing.  --------------------------------------------------------------------------------------------------  Cardiovascular History & Procedures: Cardiovascular Problems:  Diastolic heart failure  Risk Factors:  Diabetes mellitus, family history, and obesity  Cath/PCI:  None  CV Surgery:  None  EP Procedures and Devices:  None  Non-Invasive  Evaluation(s):  TTE (08/05/2018): Normal LV size and wall thickness.  LVEF 60-65% with normal wall motion.  Grade 1 diastolic dysfunction.  Moderate to severe left atrial enlargement.  Mild right atrial enlargement.  Normal RV size and function.  Normal pulmonary artery pressure.  Recent CV Pertinent Labs: Lab Results  Component Value Date   TRIG 163 (H) 08/03/2018   INR 1.14 08/05/2018   K 4.1 01/22/2019   MG 1.9 08/05/2018   BUN 12 01/22/2019   CREATININE 0.85 01/22/2019    --------------------------------------------------------------------------------------------------  Past Medical History:  Diagnosis Date  . Acute GI bleeding 02/09/2018  . Acute upper gastrointestinal bleeding 08/03/2018  . Anemia    TAKES IRON TAB  . Arthritis    SHOULDER  . B12 deficiency 02/18/2018  . Bleeding    GI  3/19  . Bronchitis    HX OF  . Cirrhosis ([place])   . Colon cancer screening   . Diabetes mellitus without complication ([place])    TYPE 2  . Fatty (change of) liver, not elsewhere classified   . Full dentures    UPPER AND LOWER  . Iron deficiency anemia due to chronic blood loss 02/18/2018  . Lung nodule, multiple   . Pernicious anemia 02/22/2018  . PUD (peptic ulcer disease)   . Raynaud's syndrome   . Upper GI bleeding 08/03/2018    Past Surgical History:  Procedure Laterality Date  . CATARACT EXTRACTION W/PHACO Right 02/06/2018   Procedure: CATARACT EXTRACTION PHACO AND INTRAOCULAR LENS PLACEMENT ([place]) RIGHT DIABETIC;  Surgeon: Leandrew Koyanagi, MD;  Location: [facility];  Service: Ophthalmology;  Laterality: Right;  DIABETIC, ORAL MED  . CATARACT EXTRACTION W/PHACO Left 04/30/2018  Procedure: CATARACT EXTRACTION PHACO AND INTRAOCULAR LENS PLACEMENT ([facility]);  Surgeon: Leandrew Koyanagi, MD;  Location: [facility];  Service: Ophthalmology;  Laterality: Left;  [place] 01:04 AP% 21.3 CDE 13.66 Fluid pack lot # [id number]  . COLONOSCOPY WITH PROPOFOL N/A 03/25/2018   Procedure:  COLONOSCOPY WITH PROPOFOL;  Surgeon: Lin Landsman, MD;  Location: [facility] ENDOSCOPY;  Service: Gastroenterology;  Laterality: N/A;  . DENTAL SURGERY     EXTRACTIONS  . ELECTROMAGNETIC NAVIGATION BROCHOSCOPY Right 01/27/2019   Procedure: ELECTROMAGNETIC NAVIGATION BRONCHOSCOPY;  Surgeon: [name], MD;  Location: [facility];  Service: Cardiopulmonary;  Laterality: Right;  . ESOPHAGOGASTRODUODENOSCOPY N/A 08/03/2018   Procedure: ESOPHAGOGASTRODUODENOSCOPY (EGD);  Surgeon: Lin Landsman, MD;  Location: [facility] ENDOSCOPY;  Service: Gastroenterology;  Laterality: N/A;  . ESOPHAGOGASTRODUODENOSCOPY (EGD) WITH PROPOFOL N/A 02/10/2018   Procedure: ESOPHAGOGASTRODUODENOSCOPY (EGD) WITH PROPOFOL;  Surgeon: Jonathon Bellows, MD;  Location: [facility] ENDOSCOPY;  Service: Gastroenterology;  Laterality: N/A;  . ESOPHAGOGASTRODUODENOSCOPY (EGD) WITH PROPOFOL N/A 03/25/2018   Procedure: ESOPHAGOGASTRODUODENOSCOPY (EGD) WITH PROPOFOL;  Surgeon: Lin Landsman, MD;  Location[facility]COPY;  Service: Gastroenterology;  Laterality: N/A;  . ESOPHAGOGASTRODUODENOSCOPY (EGD) WITH PROPOFOL N/A 04/22/2018   Procedure: ESOPHAGOGASTRODUODENOSCOPY (EGD) WITH PROPOFOL with band ligation;  Surgeon: Lin Landsman, MD;  Location: [place];  Service: Gastroenterology;  Laterality: N/A;  . ESOPHAGOGASTRODUODENOSCOPY (EGD) WITH PROPOFOL N/A 06/24/2018   Procedure: ESOPHAGOGASTRODUODENOSCOPY (EGD) WITH PROPOFOL;  Surgeon: Lin Landsman, MD;  Location: [facility] ENDOSCOPY;  Service: Gastroenterology;  Laterality: N/A;  . ESOPHAGOGASTRODUODENOSCOPY (EGD) WITH PROPOFOL N/A 11/19/2018   Procedure: ESOPHAGOGASTRODUODENOSCOPY (EGD) WITH PROPOFOL;  Surgeon: Lin Landsman, MD;  Location: [facility] ENDOSCOPY;  Service: Gastroenterology;  Laterality: N/A;  . EYE SURGERY    . HEMORRHOID BANDING  03/25/2018   Procedure: HEMORRHOID BANDING;  Surgeon: Lin Landsman, MD;  Location: [facility] ENDOSCOPY;  Service: Gastroenterology;;  .  IR EMBO ART  VEN HEMORR LYMPH EXTRAV  INC GUIDE ROADMAPPING  08/07/2018  . IR RADIOLOGIST EVAL & MGMT  09/03/2018  . IR RADIOLOGIST EVAL & MGMT  12/17/2018  . IR TIPS  08/07/2018  . RADIOLOGY WITH ANESTHESIA N/A 08/07/2018   Procedure: TIPS;  Surgeon: Corrie Mckusick, DO;  Location: [place];  Service: Anesthesiology;  Laterality: N/A;  . TONSILLECTOMY      Current Meds  Medication Sig  . cyanocobalamin (,VITAMIN B-12,) 1000 MCG/ML injection Inject 1,000 mcg into the muscle every 30 (thirty) days.   . furosemide (LASIX) 20 MG tablet TAKE 2 TABLETS BY MOUTH EVERY DAY  . hydrOXYzine (ATARAX/VISTARIL) 10 MG tablet TAKE 1 TABLET (10 MG TOTAL) BY MOUTH 3 (THREE) TIMES DAILY AS NEEDED FOR ITCHING.  . lactulose (CHRONULAC) 10 GM/15ML solution Take 20 g by mouth 2 (two) times a day.   . metFORMIN (GLUCOPHAGE) 500 MG tablet Take 1,000 mg by mouth 2 (two) times daily with a meal.   . nystatin (MYCOSTATIN/NYSTOP) powder Apply 1 g topically as needed (rash).   . ondansetron (ZOFRAN) 4 MG tablet Take 1 tablet (4 mg total) by mouth every 6 (six) hours as needed for nausea.  . ONE TOUCH ULTRA TEST test strip 2 (two) times daily.  Glory Rosebush DELICA LANCETS 85I MISC 2 (two) times daily.  Marland Kitchen spironolactone (ALDACTONE) 50 MG tablet Take 1 tablet (50 mg total) by mouth daily.  Marland Kitchen XIFAXAN 550 MG TABS tablet TAKE 1 TABLET (550 MG TOTAL) BY MOUTH 2 (TWO) TIMES DAILY.    Allergies: Patient has no known allergies.  Social History   Tobacco Use  .  Smoking status: Never Smoker  . Smokeless tobacco: Never Used  Substance Use Topics  . Alcohol use: Not Currently    Frequency: Never    Comment: No EtOH for 30 years.  Never a heavy drinker.  . Drug use: Never    Family History  Problem Relation Age of Onset  . CAD Father   . Heart attack Father 46  . Cancer Maternal Uncle   . Seizures Mother     Review of Systems: Patient reports purplish discoloration of her fingers whenever they get cold.  She has told that  this may reflect Raynaud's phenomenon, though she has never been given this formal diagnosis.  Otherwise, a 12-system review of systems was performed and was negative except as noted in the HPI.  --------------------------------------------------------------------------------------------------  Physical Exam: BP 109/71 (BP Location: Right Arm, Patient Position: Sitting, Cuff Size: Normal)   Pulse 63   Ht 5' 3" (1.6 m)   Wt 174 lb 12 oz (79.3 kg)   LMP  (LMP Unknown)   BMI 30.96 kg/m   General: NAD. HEENT: No conjunctival pallor or scleral icterus. Moist mucous membranes. OP clear. Neck: Supple without lymphadenopathy, thyromegaly, JVD, or HJR. No carotid bruit. Lungs: Normal work of breathing. Clear to auscultation bilaterally without wheezes or crackles. Heart: Regular rate and rhythm without murmurs, rubs, or gallops. Non-displaced PMI. Abd: Bowel sounds present. Soft, NT/ND without hepatosplenomegaly Ext: Trace ankle edema noted bilaterally. Radial, PT, and DP pulses are 2+ bilaterally Skin: Fingertips are cool with purplish discoloration. Neuro: CNIII-XII intact. Strength and fine-touch sensation intact in upper and lower extremities bilaterally.  No asterixis. Psych: Normal mood and affect.  EKG: Normal sinus rhythm with low voltage and poor R wave progression that may reflect lead placement.  No significant change from prior tracing on 08/03/2018.  Lab Results  Component Value Date   WBC 4.5 02/12/2019   HGB 12.4 02/12/2019   HCT 36.8 02/12/2019   MCV 87.4 02/12/2019   PLT 128 (L) 02/12/2019    Lab Results  Component Value Date   NA 140 01/22/2019   K 4.1 01/22/2019   CL 101 01/22/2019   CO2 20 01/22/2019   BUN 12 01/22/2019   CREATININE 0.85 01/22/2019   GLUCOSE 135 (H) 01/22/2019   ALT 41 (H) 12/11/2018    Lab Results  Component Value Date   TRIG 163 (H) 08/03/2018     --------------------------------------------------------------------------------------------------  ASSESSMENT AND PLAN: Chronic HFpEF: Other than trace edema on exam, Crystal [name] appears euvolemic.  She reports fatigue over the last several months with modest activity, which is likely multifactorial.  I think it would be worthwhile to repeat an echocardiogram to reassess LV function, RV size and function, and pulmonary pressure given interval TIPS procedure.  I do not recommend any medication changes at this time.  Coronary artery and aortic atherosclerosis: Prior chest CTs have shown atherosclerotic calcification involving the coronary arteries and thoracic aorta.  Other than fatigue, which is nonspecific, Crystal [name] does not have any symptoms to suggest significant coronary insufficiency.  Poor R wave progression noted on EKG today is old.  I think it would be best to repeat an echocardiogram.  Based on results, we will readdress the need for noninvasive ischemia testing.  I favor deferring this unless Crystal [name] has significant dyspnea or angina, as she would be a suboptimal candidate for coronary intervention in the setting of cirrhosis complicated by variceal bleeding.  I am reluctant to add aspirin at this time.  LDL should be checked by Crystal [name] PCP, if not already done.  If LDL is significantly above 100, statin therapy could be considered if thought to be safe from a liver perspective.  Cirrhosis: Crystal [name] appears to be well compensated at this time.  She should continue her current diuretic regimen as well as lactulose and rifaximin.  Ongoing management per Dr. Marius Ditch.  Type 2 diabetes mellitus: Last hemoglobin A1c a year ago was quite good at 5.6.  Crystal [name] should continue metformin and follow-up with her PCP for ongoing management.  Follow-up: Return to clinic in 6 months.  Nelva Bush, MD 04/02/2019 10:48 AM

## 2019-04-04 ENCOUNTER — Ambulatory Visit (INDEPENDENT_AMBULATORY_CARE_PROVIDER_SITE_OTHER): Payer: Medicare HMO

## 2019-04-04 ENCOUNTER — Other Ambulatory Visit: Payer: Self-pay

## 2019-04-04 DIAGNOSIS — I5032 Chronic diastolic (congestive) heart failure: Secondary | ICD-10-CM

## 2019-04-08 ENCOUNTER — Other Ambulatory Visit: Payer: Self-pay

## 2019-04-09 ENCOUNTER — Inpatient Hospital Stay: Payer: Medicare HMO

## 2019-04-09 ENCOUNTER — Telehealth: Payer: Self-pay | Admitting: Internal Medicine

## 2019-04-09 NOTE — Telephone Encounter (Signed)
Attempted to call the patient. No answer- I left a message for her to please call back.

## 2019-04-09 NOTE — Telephone Encounter (Signed)
Notes recorded by Nelva Bush, MD on 04/06/2019 at 10:40 AM EDT Please let Crystal [name] know that her echo shows that her left and right ventricle are contracting well. There is mild stiffening of the left ventricle, which is unchanged from her prior echo. Overall, there has been no significant change to her heart following TIPS in September. I do not recommend further testing or intervention at this time. We will f/u in the office as planned in 6 months unless concerns arise in the meantime.

## 2019-04-10 ENCOUNTER — Other Ambulatory Visit: Payer: Self-pay | Admitting: Gastroenterology

## 2019-04-10 DIAGNOSIS — Z95828 Presence of other vascular implants and grafts: Secondary | ICD-10-CM

## 2019-04-10 DIAGNOSIS — L299 Pruritus, unspecified: Secondary | ICD-10-CM

## 2019-04-10 DIAGNOSIS — K7469 Other cirrhosis of liver: Secondary | ICD-10-CM

## 2019-04-10 NOTE — Telephone Encounter (Signed)
Results called to pt. Pt verbalized understanding. Results released to My Chart.

## 2019-04-11 ENCOUNTER — Inpatient Hospital Stay: Payer: Medicare HMO | Attending: Hematology and Oncology

## 2019-04-11 ENCOUNTER — Other Ambulatory Visit: Payer: Self-pay

## 2019-04-11 VITALS — BP 117/61 | HR 62 | Temp 97.3°F | Resp 18

## 2019-04-11 DIAGNOSIS — E538 Deficiency of other specified B group vitamins: Secondary | ICD-10-CM | POA: Diagnosis not present

## 2019-04-11 MED ORDER — CYANOCOBALAMIN 1000 MCG/ML IJ SOLN
1000.0000 ug | Freq: Once | INTRAMUSCULAR | Status: AC
  Administered 2019-04-11: 1000 ug via INTRAMUSCULAR

## 2019-04-14 ENCOUNTER — Other Ambulatory Visit: Payer: Self-pay | Admitting: Hematology and Oncology

## 2019-04-14 DIAGNOSIS — R918 Other nonspecific abnormal finding of lung field: Secondary | ICD-10-CM

## 2019-04-17 ENCOUNTER — Other Ambulatory Visit: Payer: Medicare HMO

## 2019-04-18 NOTE — Progress Notes (Signed)
Tumor Board Documentation  Crystal [name] was presented by Dr Janese Banks at our Tumor Board on 04/17/2019, which included representatives from medical oncology, radiation oncology, surgical, radiology, pathology, navigation, internal medicine, research.  Crystal [name] currently presents as a current patient, for discussion with history of the following treatments: active survellience, surgical intervention(s).  Additionally, we reviewed previous medical and familial history, history of present illness, and recent lab results along with all available histopathologic and imaging studies. The tumor board considered available treatment options and made the following recommendations: Biopsy, Additional screening, Radiation therapy (primary modality) CT bbiopsy, Coag work up prior, PET for staging, Refer to Radiation Therapy  The following procedures/referrals were also placed: No orders of the defined types were placed in this encounter.   Clinical Trial Status: not discussed   Staging used: Clinical Stage    National site-specific guidelines   were discussed with respect to the case.  Tumor board is a meeting of clinicians from various specialty areas who evaluate and discuss patients for whom a multidisciplinary approach is being considered. Final determinations in the plan of care are those of the provider(s). The responsibility for follow up of recommendations given during tumor board is that of the provider.   Today's extended care, comprehensive team conference, Crystal [name] was not present for the discussion and was not examined.   Multidisciplinary Tumor Board is a multidisciplinary case peer review process.  Decisions discussed in the Multidisciplinary Tumor Board reflect the opinions of the specialists present at the conference without having examined the patient.  Ultimately, treatment and diagnostic decisions rest with the primary provider(s) and the patient.

## 2019-04-21 ENCOUNTER — Telehealth: Payer: Self-pay | Admitting: Gastroenterology

## 2019-04-21 NOTE — Telephone Encounter (Signed)
Patient called & has called pharmacy 1st they need addition information to refill    hydrOXYzine (ATARAX/VISTARIL) 10 MG tablet   Patient uses   hydrOXYzine (ATARAX/VISTARIL) 10 MG tablet   And uses [facility]. Patient states she has been out of this for about a week.

## 2019-04-21 NOTE — Progress Notes (Signed)
[facility]  [address], [address] [place], [place] [address] Phone: [phone]  Fax: [phone]   Telemedicine Office Visit:  04/22/2019  Referring physician: Lorelee Market, MD  I connected with [place] on 04/22/2019 at 12:08 PM by videoconferencing and verified that I was speaking with the correct person using 2 identifiers.  The patient was at home.  I discussed the limitations, risk, security and privacy concerns of performing an evaluation and management service by videoconferencing and the availability of in person appointments.  I also discussed with the patient that there may be a patient responsible charge related to this service.  The patient expressed understanding and agreed to proceed.   Chief Complaint: Crystal [name] is a 68 y.o. female  with two right sided pulmonary nodules and lymphadenopathy who seen for 2 month assessment.  HPI: The patient was last seen in the hematology clinic on 02/12/2019. At that time, she felt "ok".  She denied any respiratory symptoms.  Exam was stable. She received B-12.   She saw Dr. Harrell Gave End, cardiology, on 04/02/2019. Echo was scheduled. She continued on her diuretic regimen with lactulose and rifaximin. Six month follow-up was scheduled.    Echo on 04/04/2019 revealed mild stiffening of the left ventricle, unchanged from her last Echo. Ejection fraction was in the range of 55-60%. No intervention was recommended.  Her case was presented at [facility] on 04/17/2019 and the recommendation was a biopsy, additional screening, and radiation therapy (primary modality).   She received B12 on 02/12/2019, 03/12/2019, and 04/11/2019.  During the interim, the patient is doing well. Her weight has been stable. She denies any bleeding since surgery. She is maintaining her diabetes. She reports mild back pain.  She was given a 2-year life expectancy following her TIPS procedure on 08/07/2018.  As a result, she  is not interested in a biopsy, PET scan, or treatment. She is interested in continuing her iron and B12 shots.    Past Medical History:  Diagnosis Date   Acute GI bleeding 02/09/2018   Acute upper gastrointestinal bleeding 08/03/2018   Anemia    TAKES IRON TAB   Arthritis    SHOULDER   B12 deficiency 02/18/2018   Bleeding    GI  3/19   Bronchitis    HX OF   Cirrhosis ([place])    Colon cancer screening    Diabetes mellitus without complication ([place])    TYPE 2   Fatty (change of) liver, not elsewhere classified    Full dentures    UPPER AND LOWER   Iron deficiency anemia due to chronic blood loss 02/18/2018   Lung nodule, multiple    Pernicious anemia 02/22/2018   PUD (peptic ulcer disease)    Raynaud's syndrome    Upper GI bleeding 08/03/2018    Past Surgical History:  Procedure Laterality Date   CATARACT EXTRACTION W/PHACO Right 02/06/2018   Procedure: CATARACT EXTRACTION PHACO AND INTRAOCULAR LENS PLACEMENT ([place]) RIGHT DIABETIC;  Surgeon: Leandrew Koyanagi, MD;  Location: [place];  Service: Ophthalmology;  Laterality: Right;  DIABETIC, ORAL MED   CATARACT EXTRACTION W/PHACO Left 04/30/2018   Procedure: CATARACT EXTRACTION PHACO AND INTRAOCULAR LENS PLACEMENT ([facility]);  Surgeon: Leandrew Koyanagi, MD;  Location: [facility];  Service: Ophthalmology;  Laterality: Left;  [place] 01:04 AP% 21.3 CDE 13.66 Fluid pack lot # [id number]   COLONOSCOPY WITH PROPOFOL N/A 03/25/2018   Procedure: COLONOSCOPY WITH PROPOFOL;  Surgeon: Lin Landsman, MD;  Location: [facility];  Service: Gastroenterology;  Laterality: N/A;   DENTAL SURGERY     EXTRACTIONS   ELECTROMAGNETIC NAVIGATION BROCHOSCOPY Right 01/27/2019   Procedure: ELECTROMAGNETIC NAVIGATION BRONCHOSCOPY;  Surgeon: [name], MD;  Location: [facility];  Service: Cardiopulmonary;  Laterality: Right;   ESOPHAGOGASTRODUODENOSCOPY N/A 08/03/2018   Procedure: ESOPHAGOGASTRODUODENOSCOPY (EGD);  Surgeon:  Lin Landsman, MD;  Location: [facility];  Service: Gastroenterology;  Laterality: N/A;   ESOPHAGOGASTRODUODENOSCOPY (EGD) WITH PROPOFOL N/A 02/10/2018   Procedure: ESOPHAGOGASTRODUODENOSCOPY (EGD) WITH PROPOFOL;  Surgeon: Jonathon Bellows, MD;  Location: [facility];  Service: Gastroenterology;  Laterality: N/A;   ESOPHAGOGASTRODUODENOSCOPY (EGD) WITH PROPOFOL N/A 03/25/2018   Procedure: ESOPHAGOGASTRODUODENOSCOPY (EGD) WITH PROPOFOL;  Surgeon: Lin Landsman, MD;  Location: [facility];  Service: Gastroenterology;  Laterality: N/A;   ESOPHAGOGASTRODUODENOSCOPY (EGD) WITH PROPOFOL N/A 04/22/2018   Procedure: ESOPHAGOGASTRODUODENOSCOPY (EGD) WITH PROPOFOL with band ligation;  Surgeon: Lin Landsman, MD;  Location: [place];  Service: Gastroenterology;  Laterality: N/A;   ESOPHAGOGASTRODUODENOSCOPY (EGD) WITH PROPOFOL N/A 06/24/2018   Procedure: ESOPHAGOGASTRODUODENOSCOPY (EGD) WITH PROPOFOL;  Surgeon: Lin Landsman, MD;  Location: [facility] ENDOSCOPY;  Service: Gastroenterology;  Laterality: N/A;   ESOPHAGOGASTRODUODENOSCOPY (EGD) WITH PROPOFOL N/A 11/19/2018   Procedure: ESOPHAGOGASTRODUODENOSCOPY (EGD) WITH PROPOFOL;  Surgeon: Lin Landsman, MD;  Location: [place];  Service: Gastroenterology;  Laterality: N/A;   EYE SURGERY     HEMORRHOID BANDING  03/25/2018   Procedure: HEMORRHOID BANDING;  Surgeon: Lin Landsman, MD;  Location: [facility];  Service: Gastroenterology;;   IR EMBO ART  VEN HEMORR LYMPH EXTRAV  INC GUIDE ROADMAPPING  08/07/2018   IR RADIOLOGIST EVAL & MGMT  09/03/2018   IR RADIOLOGIST EVAL & MGMT  12/17/2018   IR TIPS  08/07/2018   RADIOLOGY WITH ANESTHESIA N/A 08/07/2018   Procedure: TIPS;  Surgeon: Corrie Mckusick, DO;  Location: [place];  Service: Anesthesiology;  Laterality: N/A;   TONSILLECTOMY      Family History  Problem Relation Age of Onset   CAD Father    Heart attack Father 26   Cancer Maternal Uncle     Seizures Mother     Social History:  reports that she has never smoked. She has never used smokeless tobacco. She reports previous alcohol use. She reports that she does not use drugs. Patient denies known exposures to radiation on toxins. She retired from [employer] in 11/2017.  The patient is alone today.  Participants in the patient's visit and their role in the encounter included the patient and Waymon Budge, RN CMA, today.  The intake visit was provided by Waymon Budge, RN.  Allergies: No Known Allergies  Current Medications: Current Outpatient Medications  Medication Sig Dispense Refill   cyanocobalamin (,VITAMIN B-12,) 1000 MCG/ML injection Inject 1,000 mcg into the muscle every 30 (thirty) days.      furosemide (LASIX) 20 MG tablet TAKE 2 TABLETS BY MOUTH EVERY DAY 180 tablet 1   hydrOXYzine (ATARAX/VISTARIL) 10 MG tablet TAKE 1 TABLET (10 MG TOTAL) BY MOUTH 3 (THREE) TIMES DAILY AS NEEDED FOR ITCHING. 30 tablet 0   lactulose (CHRONULAC) 10 GM/15ML solution Take 20 g by mouth 2 (two) times a day.      metFORMIN (GLUCOPHAGE) 500 MG tablet Take 1,000 mg by mouth 2 (two) times daily with a meal.      nystatin (MYCOSTATIN/NYSTOP) powder Apply 1 g topically as needed (rash).      ondansetron (ZOFRAN) 4 MG tablet Take 1 tablet (4 mg total) by mouth every 6 (six) hours  as needed for nausea. 20 tablet 0   ONE TOUCH ULTRA TEST test strip 2 (two) times daily.  5   ONETOUCH DELICA LANCETS 93A MISC 2 (two) times daily.  5   pantoprazole (PROTONIX) 40 MG tablet Take 1 tablet (40 mg total) by mouth 2 (two) times daily. 60 tablet 0   spironolactone (ALDACTONE) 50 MG tablet Take 1 tablet (50 mg total) by mouth daily. 180 tablet 0   XIFAXAN 550 MG TABS tablet TAKE 1 TABLET (550 MG TOTAL) BY MOUTH 2 (TWO) TIMES DAILY. 60 tablet 5   No current facility-administered medications for this visit.     Review of Systems  Constitutional: Negative for chills, diaphoresis, fever,  malaise/fatigue and weight loss (stable).       Doing "ok".  HENT: Negative.  Negative for congestion, ear pain, nosebleeds, sinus pain and sore throat.   Eyes: Negative.  Negative for blurred vision, double vision, photophobia and pain.       S/p cataract surgery.  Respiratory: Negative.  Negative for cough, hemoptysis, sputum production and shortness of breath.   Cardiovascular: Negative for chest pain, orthopnea, leg swelling and PND.  Gastrointestinal: Negative for abdominal pain, blood in stool, constipation, diarrhea (lactulose causes loose stools), heartburn, melena, nausea and vomiting.       Portal hypertensive gastropathy s/p variceal ligation.  Genitourinary: Negative.  Negative for dysuria, frequency, hematuria and urgency.  Musculoskeletal: Positive for back pain. Negative for falls, joint pain, myalgias and neck pain.  Skin: Positive for itching. Negative for rash.  Neurological: Negative.  Negative for dizziness, sensory change, speech change, focal weakness, weakness and headaches.  Endo/Heme/Allergies: Does not bruise/bleed easily.       Diabetes on Metformin.  Psychiatric/Behavioral: Negative.  Negative for depression and memory loss. The patient is not nervous/anxious and does not have insomnia.   All other systems reviewed and are negative.   Performance status (ECOG): 1-2  Physical Exam  Constitutional: She is oriented to person, place, and time. She appears well-developed and well-nourished. No distress.  HENT:  Head: Normocephalic and atraumatic.  White hair.  Eyes: Conjunctivae and EOM are normal. No scleral icterus.  Glasses. Blue eyes.  Neurological: She is alert and oriented to person, place, and time.  Skin: She is not diaphoretic.  Telangectasias lip and face.  Psychiatric: She has a normal mood and affect. Her behavior is normal. Judgment and thought content normal.  Nursing note reviewed.   No visits with results within 3 Day(s) from this visit.    Latest known visit with results is:  Appointment on 02/12/2019  Component Date Value Ref Range Status   Ferritin 02/12/2019 35  11 - 307 ng/mL Final   Performed at [facility], [address] [place], [place] [address]   WBC 02/12/2019 4.5  4.0 - 10.5 K/uL Final   RBC 02/12/2019 4.21  3.87 - 5.11 MIL/uL Final   Hemoglobin 02/12/2019 12.4  12.0 - 15.0 g/dL Final   HCT 02/12/2019 36.8  36.0 - 46.0 % Final   MCV 02/12/2019 87.4  80.0 - 100.0 fL Final   MCH 02/12/2019 29.5  26.0 - 34.0 pg Final   MCHC 02/12/2019 33.7  30.0 - 36.0 g/dL Final   RDW 02/12/2019 16.4* 11.5 - 15.5 % Final   Platelets 02/12/2019 128* 150 - 400 K/uL Final   Comment: Immature Platelet Fraction may be clinically indicated, consider ordering this additional test [id number]    nRBC 02/12/2019 0.0  0.0 - 0.2 %  Final   Neutrophils Relative % 02/12/2019 58  % Final   Neutro Abs 02/12/2019 2.6  1.7 - 7.7 K/uL Final   Lymphocytes Relative 02/12/2019 24  % Final   Lymphs Abs 02/12/2019 1.1  0.7 - 4.0 K/uL Final   Monocytes Relative 02/12/2019 9  % Final   Monocytes Absolute 02/12/2019 0.4  0.1 - 1.0 K/uL Final   Eosinophils Relative 02/12/2019 8  % Final   Eosinophils Absolute 02/12/2019 0.4  0.0 - 0.5 K/uL Final   Basophils Relative 02/12/2019 1  % Final   Basophils Absolute 02/12/2019 0.0  0.0 - 0.1 K/uL Final   Immature Granulocytes 02/12/2019 0  % Final   Abs Immature Granulocytes 02/12/2019 0.01  0.00 - 0.07 K/uL Final   Performed at [facility], [address], [place], [place] [address]    Assessment:  Crystal [name] is a 68 y.o. female with a right lower lobe pulmonary nodule.  She denies any smoking history.  Abdomen and pelvic CT on 02/09/2018 revealed an 1.8 cm spiculated density in right lower lobe concerning for malignancy. There was mildly nodular hepatic contour concerning for hepatic cirrhosis.  There was moderate splenomegaly suggesting portal venous  hypertension.  Mild ascites was noted.  There were mildly enlarged retroperitoneal lymph nodes (largest 1.1 cm) concerning for possible metastatic disease or malignancy.  There was a 7 cm left ovarian cyst.  Further evaluation with MRI was recommended to evaluate for possible neoplasm.  CA125 was 351.3 and CEA 1.1 on 02/21/2018.   Chest CT on 02/25/2018 revealed a 2.3 x 1.5 x 1.0 cm perifissural nodule with spiculated margins in the right middle lobe.  There was a 1.5 x 1.5 x 1.4 cm right lower lobe spiculated nodule.  There was a moderate left sided pleural effusion.    PET scan on 03/08/2018 revealed a 2.5 x 1.8 cm sub solid nodular opacity in the RML (SUV 2.7) and a 1.6 cm rounded slightly spiculated RLL pulmonary nodule (SUV 2.1).  There were no enlarged or hypermetabolic mediastinal or hilar lymph nodes.  There was a small left pleural effusion.  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  Chest CT on 05/15/2018 revealed no significant changes in the 2 previously noted nodules in the RIGHT lung. Irregular RML lesion measures 2.4 x 1.4 cm (previously 2.3 x 1.5 cm). Spiculated central RLL lesion measures 1.8 x 1.4 cm (previously 1.6 x 1.5 cm). Both areas remain concerning for neoplasm. There was interval decrease in ascites volume overall. Upper abdominal lymphadenopathy noted that was felt to be reactive due to underlying liver disease.    Chest CT on 08/15/2018 revealed the spiculated right lower lobe lung lesion appeared slightly increased and was not significantly changed in size compared with previous exam.  Morphologically, this was worrisome for a small bronchogenic neoplasm.  The right middle lobe lung nodule was slightly decreased in size in the interval and may be post infectious or inflammatory in etiology.  Super D chest CT on 01/24/2019 revealed a 3.2 x 2.3 cm ill-defined anterior right lung nodule  compared to 2.9 x 2.3 cm previously. Minor fissure was not well, and as such, this nodule can not be confidently localized to the right upper or middle lobe.  The right lower lobe nodule measured 2.0 x 2.0 cm compared to 2.0 x 1.9 cm.  She was admitted to [facility] from 02/09/2018 - 02/12/2018 with a variceal hemorrhage.  EGD  on 02/10/2018 revealed 5 columns of grade III esophageal varices in the lower third of the esophagus.  There was stigmata of recent bleeding and red wale signs.  She underwent variceal ligation x 10.   She required 3 units of PRBCs.  EGD on 03/25/2018 revealed 2 angiectasias (non-bleeding) in the second portion of the duodenum, and a few diminutive (non-bleeding) angiectasias in the prepyloric region of the stomach that were treated APC. There were 4 non-bleeding ulcers (Forrest Class III)  found in the gastric fundus. There were 4 columns of large non-bleeding varices in the lower third of the esophagus, of which demonstrated no stigmata of bleeding.  Varices were banded.  EGD on 04/22/2018  revealed one non-bleeding cratered gastric ulcer ([place]rest Class III) in the lesser curvature of the gastric body. Duodenal bulb and second portion of the duodenum were normal. Moderate portal hypertensive gastropathy noted in the stomach. Large varices in the lower third of the esophagus noted. 2 bands were placed with incomplete eradication of the lesions.   Colonoscopy on 03/25/2018 revealed a single 5 mm sessile polyp in the ascending colon. Patient has rectal varices and external hemorrhoids. Pathology returned as tubular adenoma, and was negative for high grade dysplasia and malignancy.   She has iron deficiency anemia.  Ferritin was 6.  She is on ferrous sulfate 325 mg TID.  She has B12 deficiency.  B12 was 103.   Intrinsic factor antibody was negative.  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  She began B12 injections on 03/06/2018 (last 04/11/2019).  Folate  was 26 on 11/20/2018.  B12 was 485 on 05/24/2018.  Diet is good.  She received Venofer weekly x 4 (07/12/2018 - 07/31/2018).    Ferritin has been followed: 6 on 02/09/2018, 16 on 05/24/2018, 13 on 07/10/2018, 374 on 08/15/2018, 485 on 08/21/2018, 114 on 09/25/2018, 63 on 11/20/2018, and 35 on 02/12/2019.  She has hepatitis C antibodies.  HCV RNA was not detected. She has been exposed to hepatitis B.  Anti-smooth muscle antibodies are weakly positive.  ANA was positive (> 1:1280 centromere antibody).  Negative studies included: ceruloplasmin, anti-mitochondrial antibodies, hepatitis B IgM, HIV, and alpha-1 antitrypsin.  PT was 15.2 (INR 1.21).  AFP was 1.5 on 05/28/2018 and 2.4 on 11/20/2018.  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  She has portal hypertension and a history of recurrent bleeding esophageal varices.  She was given a 2-year life expectancy following her TIPS procedure  RUQ ultrasound on 05/29/2018 revealed cholelithiasis. There was a thickened edematous gallbladder wall. The wall thickening could be due to hypoproteinemia/hypoalbuminemia or cholecystitis. Negative sonographic Murphy's sign.  There was a heterogeneous slightly nodular liver consistent with cirrhosis.  Pelvic ultrasound  on 03/18/2018  demonstrated a simple cyst in the LEFT ovary measuring 6.1 x 4.5 x 6.6 cm. Repeat imaging recommended in 1 year to document stability.  Symptomatically, she denies any new complaints.  She is not interested in pursuing treatment secondary to her limited life expectancy s/p TIPS.  Plan: 1. Labs on 04/24/2019:  CBC with diff, CMP, AFP, ferritin, PT, PTT. 2. Decompensated cirrhosis s/p TIPS Patient is doing fairly well s/p TIPS procedure.  She denies any bleeding. She notes a limited life expectancy (2 years post TIPS). Continue AFP checks every 6 months (due 05/2019). Continue follow-up with GI. 3. Pulmonary nodules Discuss concern for  synchronous lung primaries. Discuss tumor board discussion with plan for biopsy, PET scan if +, and radiation  to lung lesion(s) if no mediastinal or distant PET activity. Patient adamant that she does not wish to pursue based on her limited life expectancy. Continue to monitor. 4. Iron deficiency anemia Hematocrit 36.8.  Hemoglobin 12.4.  MCV 87.4 on 02/12/2019. Ferritin was 35 on 02/12/2019. Patient wishes to continue monitoring and IV iron if needed. Schedule labs to be drawn. 5. B12 deficiency  Continue B12 monthly x 6 (last 04/11/2019). 6.   RTC in 3 months for MD assessment , labs (CBC with diff, ferritin-day before), B12, and +/- Venofer.  I discussed the assessment and treatment plan with the patient.  The patient was provided an opportunity to ask questions and all were answered.  The patient agreed with the plan and demonstrated an understanding of the instructions.  The patient was advised to call back or seek an in person evaluation if the symptoms worsen or if the condition fails to improve as anticipated.  I provided 12 minutes (12:08 PM - 12:20 PM) of face-to-face video visit time during this this encounter and > 50% was spent counseling as documented under my assessment and plan.  I provided these services from the [facility] office.   Nolon Stalls, MD, PhD  04/22/2019, 12:08 PM   I, Molly Dorshimer, am acting as Education administrator for [employer]. Mike Gip, MD, PhD.  I, Adonys Wildes C. Mike Gip, MD, have reviewed the above documentation for accuracy and completeness, and I agree with the above.

## 2019-04-22 ENCOUNTER — Other Ambulatory Visit: Payer: Self-pay

## 2019-04-22 ENCOUNTER — Encounter: Payer: Self-pay | Admitting: Hematology and Oncology

## 2019-04-22 ENCOUNTER — Other Ambulatory Visit: Payer: Self-pay | Admitting: Hematology and Oncology

## 2019-04-22 ENCOUNTER — Inpatient Hospital Stay: Payer: Medicare HMO | Attending: Hematology and Oncology | Admitting: Hematology and Oncology

## 2019-04-22 DIAGNOSIS — K7460 Unspecified cirrhosis of liver: Secondary | ICD-10-CM | POA: Diagnosis not present

## 2019-04-22 DIAGNOSIS — D500 Iron deficiency anemia secondary to blood loss (chronic): Secondary | ICD-10-CM

## 2019-04-22 DIAGNOSIS — R188 Other ascites: Secondary | ICD-10-CM

## 2019-04-22 DIAGNOSIS — R918 Other nonspecific abnormal finding of lung field: Secondary | ICD-10-CM

## 2019-04-22 DIAGNOSIS — R911 Solitary pulmonary nodule: Secondary | ICD-10-CM

## 2019-04-22 DIAGNOSIS — Z7189 Other specified counseling: Secondary | ICD-10-CM

## 2019-04-22 DIAGNOSIS — D508 Other iron deficiency anemias: Secondary | ICD-10-CM | POA: Insufficient documentation

## 2019-04-22 DIAGNOSIS — E538 Deficiency of other specified B group vitamins: Secondary | ICD-10-CM | POA: Diagnosis not present

## 2019-04-22 NOTE — Telephone Encounter (Signed)
Called pharmacy spoke with pharmacist and clarification on medication has been done

## 2019-04-22 NOTE — Progress Notes (Signed)
Confirmed Name, DOB, and Address. Denies any concerns.

## 2019-04-23 DIAGNOSIS — L089 Local infection of the skin and subcutaneous tissue, unspecified: Secondary | ICD-10-CM | POA: Diagnosis not present

## 2019-04-23 DIAGNOSIS — E119 Type 2 diabetes mellitus without complications: Secondary | ICD-10-CM | POA: Diagnosis not present

## 2019-04-23 DIAGNOSIS — I7300 Raynaud's syndrome without gangrene: Secondary | ICD-10-CM | POA: Diagnosis not present

## 2019-04-24 ENCOUNTER — Other Ambulatory Visit: Payer: Self-pay

## 2019-04-24 ENCOUNTER — Inpatient Hospital Stay: Payer: Medicare HMO

## 2019-04-24 DIAGNOSIS — K7460 Unspecified cirrhosis of liver: Secondary | ICD-10-CM

## 2019-04-24 DIAGNOSIS — E538 Deficiency of other specified B group vitamins: Secondary | ICD-10-CM | POA: Diagnosis present

## 2019-04-24 DIAGNOSIS — R188 Other ascites: Secondary | ICD-10-CM

## 2019-04-24 DIAGNOSIS — R911 Solitary pulmonary nodule: Secondary | ICD-10-CM

## 2019-04-24 DIAGNOSIS — D508 Other iron deficiency anemias: Secondary | ICD-10-CM | POA: Diagnosis not present

## 2019-04-24 LAB — CBC WITH DIFFERENTIAL/PLATELET
Abs Immature Granulocytes: 0.01 10*3/uL (ref 0.00–0.07)
Basophils Absolute: 0 10*3/uL (ref 0.0–0.1)
Basophils Relative: 1 %
Eosinophils Absolute: 0.2 10*3/uL (ref 0.0–0.5)
Eosinophils Relative: 5 %
HCT: 35.8 % — ABNORMAL LOW (ref 36.0–46.0)
Hemoglobin: 12.1 g/dL (ref 12.0–15.0)
Immature Granulocytes: 0 %
Lymphocytes Relative: 23 %
Lymphs Abs: 0.9 10*3/uL (ref 0.7–4.0)
MCH: 29.2 pg (ref 26.0–34.0)
MCHC: 33.8 g/dL (ref 30.0–36.0)
MCV: 86.5 fL (ref 80.0–100.0)
Monocytes Absolute: 0.4 10*3/uL (ref 0.1–1.0)
Monocytes Relative: 11 %
Neutro Abs: 2.3 10*3/uL (ref 1.7–7.7)
Neutrophils Relative %: 60 %
Platelets: 129 10*3/uL — ABNORMAL LOW (ref 150–400)
RBC: 4.14 MIL/uL (ref 3.87–5.11)
RDW: 15 % (ref 11.5–15.5)
WBC: 3.9 10*3/uL — ABNORMAL LOW (ref 4.0–10.5)
nRBC: 0 % (ref 0.0–0.2)

## 2019-04-24 LAB — COMPREHENSIVE METABOLIC PANEL
ALT: 31 U/L (ref 0–44)
AST: 61 U/L — ABNORMAL HIGH (ref 15–41)
Albumin: 3.2 g/dL — ABNORMAL LOW (ref 3.5–5.0)
Alkaline Phosphatase: 168 U/L — ABNORMAL HIGH (ref 38–126)
Anion gap: 10 (ref 5–15)
BUN: 11 mg/dL (ref 8–23)
CO2: 17 mmol/L — ABNORMAL LOW (ref 22–32)
Calcium: 9.1 mg/dL (ref 8.9–10.3)
Chloride: 104 mmol/L (ref 98–111)
Creatinine, Ser: 0.91 mg/dL (ref 0.44–1.00)
GFR calc Af Amer: >60 mL/min (ref >60)
GFR calc non Af Amer: >60 mL/min (ref >60)
Glucose, Bld: 85 mg/dL (ref 70–99)
Potassium: 3.7 mmol/L (ref 3.5–5.1)
Sodium: 131 mmol/L — ABNORMAL LOW (ref 135–145)
Total Bilirubin: 1.5 mg/dL — ABNORMAL HIGH (ref 0.3–1.2)
Total Protein: 7.3 g/dL (ref 6.5–8.1)

## 2019-04-24 LAB — APTT: aPTT: 37 seconds — ABNORMAL HIGH (ref 24–36)

## 2019-04-24 LAB — PROTIME-INR
INR: 1.2 (ref 0.8–1.2)
Prothrombin Time: 15.2 seconds (ref 11.4–15.2)

## 2019-04-24 LAB — FERRITIN: Ferritin: 22 ng/mL (ref 11–307)

## 2019-04-25 LAB — AFP TUMOR MARKER: AFP, Serum, Tumor Marker: 2 ng/mL (ref 0.0–8.3)

## 2019-05-06 ENCOUNTER — Other Ambulatory Visit: Payer: Medicare HMO

## 2019-05-07 ENCOUNTER — Ambulatory Visit: Payer: Medicare HMO | Admitting: Hematology and Oncology

## 2019-05-07 ENCOUNTER — Ambulatory Visit: Payer: Medicare HMO

## 2019-05-12 ENCOUNTER — Inpatient Hospital Stay: Payer: Medicare HMO

## 2019-05-12 ENCOUNTER — Other Ambulatory Visit: Payer: Self-pay

## 2019-05-12 DIAGNOSIS — E538 Deficiency of other specified B group vitamins: Secondary | ICD-10-CM

## 2019-05-12 DIAGNOSIS — K7460 Unspecified cirrhosis of liver: Secondary | ICD-10-CM | POA: Diagnosis not present

## 2019-05-12 DIAGNOSIS — D508 Other iron deficiency anemias: Secondary | ICD-10-CM | POA: Diagnosis not present

## 2019-05-12 MED ORDER — CYANOCOBALAMIN 1000 MCG/ML IJ SOLN
1000.0000 ug | Freq: Once | INTRAMUSCULAR | Status: AC
  Administered 2019-05-12: 1000 ug via INTRAMUSCULAR

## 2019-05-12 NOTE — Patient Instructions (Signed)

## 2019-05-19 DIAGNOSIS — K922 Gastrointestinal hemorrhage, unspecified: Secondary | ICD-10-CM | POA: Diagnosis not present

## 2019-05-19 DIAGNOSIS — D649 Anemia, unspecified: Secondary | ICD-10-CM | POA: Diagnosis not present

## 2019-05-19 DIAGNOSIS — L089 Local infection of the skin and subcutaneous tissue, unspecified: Secondary | ICD-10-CM | POA: Diagnosis not present

## 2019-05-19 DIAGNOSIS — R69 Illness, unspecified: Secondary | ICD-10-CM | POA: Diagnosis not present

## 2019-05-19 DIAGNOSIS — E119 Type 2 diabetes mellitus without complications: Secondary | ICD-10-CM | POA: Diagnosis not present

## 2019-05-20 ENCOUNTER — Encounter: Payer: Self-pay | Admitting: Gastroenterology

## 2019-05-20 ENCOUNTER — Other Ambulatory Visit: Payer: Self-pay

## 2019-05-20 ENCOUNTER — Ambulatory Visit (INDEPENDENT_AMBULATORY_CARE_PROVIDER_SITE_OTHER): Payer: Medicare HMO | Admitting: Gastroenterology

## 2019-05-20 VITALS — BP 111/68 | HR 67 | Temp 98.2°F | Resp 16 | Ht 63.0 in | Wt 173.8 lb

## 2019-05-20 DIAGNOSIS — K7469 Other cirrhosis of liver: Secondary | ICD-10-CM | POA: Diagnosis not present

## 2019-05-20 DIAGNOSIS — I8511 Secondary esophageal varices with bleeding: Secondary | ICD-10-CM

## 2019-05-20 DIAGNOSIS — K7290 Hepatic failure, unspecified without coma: Secondary | ICD-10-CM

## 2019-05-20 DIAGNOSIS — D500 Iron deficiency anemia secondary to blood loss (chronic): Secondary | ICD-10-CM

## 2019-05-20 DIAGNOSIS — K7581 Nonalcoholic steatohepatitis (NASH): Secondary | ICD-10-CM | POA: Diagnosis not present

## 2019-05-20 DIAGNOSIS — B356 Tinea cruris: Secondary | ICD-10-CM

## 2019-05-20 DIAGNOSIS — K7460 Unspecified cirrhosis of liver: Secondary | ICD-10-CM | POA: Diagnosis not present

## 2019-05-20 DIAGNOSIS — I8510 Secondary esophageal varices without bleeding: Secondary | ICD-10-CM | POA: Diagnosis not present

## 2019-05-20 DIAGNOSIS — K7682 Hepatic encephalopathy: Secondary | ICD-10-CM

## 2019-05-20 DIAGNOSIS — R188 Other ascites: Secondary | ICD-10-CM | POA: Diagnosis not present

## 2019-05-20 DIAGNOSIS — M810 Age-related osteoporosis without current pathological fracture: Secondary | ICD-10-CM

## 2019-05-20 MED ORDER — RIFAXIMIN 550 MG PO TABS: ORAL_TABLET | ORAL | 5 refills | Status: DC

## 2019-05-20 MED ORDER — FUROSEMIDE 20 MG PO TABS: 40.0000 mg | ORAL_TABLET | Freq: Every day | ORAL | 5 refills | Status: DC

## 2019-05-20 MED ORDER — SPIRONOLACTONE 50 MG PO TABS: 100.0000 mg | ORAL_TABLET | Freq: Every day | ORAL | 5 refills | Status: DC

## 2019-05-20 NOTE — Progress Notes (Signed)
[name], MD [address]  [place]  [place], [place] [address]  Main: [phone]  Fax: [phone]    Gastroenterology Consultation  Referring Provider:     Lorelee Market, MD Primary Care Physician:  Lorelee Market, MD Primary Gastroenterologist:  Dr. [name] Reason for Consultation:     Decompensated cirrhosis        HPI:   Crystal [name] is a 68 y.o. female referred by Dr. Lorelee Market, MD  for consultation & management of decompensated cirrhosis. Patient was recently diagnosed with decompensated cirrhosis, when she presented to [facility] on 02/09/2018 with large volume hematemesis secondary to variceal hemorrhage, underwent upper endoscopy on 02/10/2018 by Dr. Vicente Males, found to have large esophageal varices with red wale signs and underwent variceal ligation x 10. Her bleeding resolved by the time of discharge. Patient was found to have hepatitis C antibodies and HCV virus was not detected. She had other secondary liver disease workup which revealed weakly positive anti-smooth muscle antibodies. She has long-standing history of diabetes, on metformin. She has severe iron deficiency and B12 deficiency, discharged on oral iron and oral B12 supplementation. She was supposed to start nadolol but she did not get prescription filled due to high cost. Patient was just discharged from the hospital 2 days ago  Interval summary: since discharge, patient has been doing fairly well. She denies hematemesis, melena, rectal bleeding. She reports worsening of abdominal distention and swelling of legs which started prior to hospitalization. She is not on diuretics. She is taking iron and B12 supplements, omeprazole.she reports her stools are dark but formed. She wants to know if she can restart drinking Coke. She is not on a low-sodium diet. She denies history of heavy smoking or heavy alcohol use in the past. She denies easy bruising, altered sleep  cycle   Follow-up visit 02/22/2018 Patient is seen by Dr. Mike Gip [place] for lung nodule as well as retroperitoneal lymphadenopathy. Workup is in progress. Patient is here for follow-up with regards to her decompensated cirrhosis. She is adhering to medications that I recommended during last visit. She is taking Lasix 20 mg, spironolactone 50 mg, propranolol 20 mg twice a day.She is adhering to a low-salt diet, denies swelling of legs. She has developed cellulitis in her left lower leg and is being started on antibiotic. She says her abdominal distention is improving. She denies melena, hematochezia, hematemesis. She is taking oral iron 3 pills daily and vitamin B12 daily by mouth. She reports that she has been constipated and taking Colace. She otherwise denies any complaints today. She said she had given blood samples for the labs that I ordered last week but I do not see any results. We're calling the lab to find out the status  Follow-up visit 03/15/2018 Patient is seen by Dr. Mike Gip yesterday, undergoing evaluation for lung lesion, plan is to repeat CT scan in 63month There is no concern for retroperitoneal lymphadenopathy. She is scheduled for pelvic ultrasound. She has lost about 27 pounds in last 3 weeks, mostly from aggressive diuresis as she was severely volume overloaded when he saw her in clinic on 02/22/2018. At that time, I increased her diuretics dose. She reports feeling fatigued. Reports that her stools are blackish brown as she is taking oral iron 3 pills daily along with MiraLAX. She denies hematemesis, rectal bleeding. She is adherent to low-salt diet  Follow-up visit 04/12/2018 Patient underwent upper endoscopy with variceal ligation without any  complications. She regained 12 pounds back from worsening ascites and swelling of legs. She is currently on Lasix 20 mg and spironolactone 50 mg daily,  she also decreased propranolol to once a day by herself instead of 2 times  daily. She denies black stools, blood in the stools. She is taking MiraLAX once daily, having one to 2 bowel movements sometimes hard. She denies insomnia, daytime somnolence or lethargy or forgetfulness and confusion. She is undergoing eye surgery on 04/30/2018  Follow-up visit 04/19/2018 She is on furosemide 40 mg and spironolactone 100 mg daily. Her weight has is stable however, she reports she feels less distended and swelling of legs is better. She has no other complaints today.  Follow-up visit 05/24/2018 She underwent EGD since last visit which revealed large esophageal varices and 2 bands were placed. There was some oozing after attempting 3rd banding on one of the varix which stopped after hemospray. She also has antral ulcer, clean-based. She is currently on omeprazole 40 mg twice daily. she's taking furosemide 40 mg, spironolactone 100 mg, propranolol 20 mg twice daily. She is taking oral iron once daily. she is no longer on B12 replacement. She denies black stools, blood in the stools, nausea, vomiting, swelling of legs for abdominal distention  Follow-up visit 08/27/2018 Patient was admitted to [facility] secondary to severe upper GI bleed from ruptured esophageal varices on 08/03/2018.  Upper endoscopy was performed and I attempted to locate the esophageal varices.  This was unsuccessful due to extensive scarring from prior banding.  Therefore, patient was transferred to [facility] for TIPS.  Patient successfully underwent TIPS placement with coil embolization of the left gastric vein on 08/07/2018.  The bleeding was controlled.  She was started on lactulose upon discharge.  Since follow-up, patient reports that she is not seeing black stools or having hematemesis.  She feels lethargic, having trouble to balance when she walks, her speech is slow.  She said she was confused when she was in the hospital after the procedure, therefore she was started on lactulose which she is taking currently. She is  having 1-2 yellow-colored soft bowel movements daily.  She continues to take propranolol, diuretics.  Her hemoglobin after discharge was 12 with normal ferritin levels.  She denies any other GI symptoms. Came by herself today, she has not been driving.  Roommate drove her today to the office visit  Follow-up visit 09/11/2018 She followed up with Dr. [name], IR.  Underwent Dopplers and TIPS is patent. She denies swelling of legs, abdominal distention, dark stools, hematemesis.  She reports being alert and able to manage her ADLs.  She is taking lactulose 30 mL twice daily.  She is able to finish it today and asking for refill.  She has not started rifaximin yet.  She stopped propranolol and Lasix.  She continues to take spironolactone 100 mg daily, Protonix twice daily.  Follow-up visit 12/11/2018 Patient underwent EGD earlier this month and there was no evidence of esophageal or gastric varices.  Scars from prior banding has nicely healed.  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  Since her TIPS placement in 07/2018, patient has gradually gained weight, almost 20 pounds and predominantly secondary to volume overload.  She has progressively worsening swelling of legs which she did not have prior to TIPS placement.  I started her back on Lasix 40 mg and spironolactone 50 mg daily.  Her swelling of legs has remained the same.  She keeps her legs elevated during sleep  and feels lighter in the morning.  She also reports insomnia and pruritus on her face, back and abdomen worse at night.  Her most recent LFTs revealed mild elevated bilirubin and alkaline phosphatase levels have been mildly elevated chronically.  She denies black stools or any other GI complaints  Follow-up visit 05/20/2019 Patient has seen Dr. Saunders Revel, cardiologist.  Overall, she has good cardiac function based on recent echo post tips placement.  He did not recommend further testing or intervention at this time.  Patient reports that she has  been doing fairly well.  She does have pain in her mid back particularly when she is working for longer hours.  She denies abdominal pain, weight loss.  Her swelling of legs has significantly improved after increasing Lasix and spironolactone.  Currently, she is taking Lasix 40 mg daily and spironolactone 100 mg daily.  She denies rectal bleeding, melena, abdominal distention.  She does report pruritic rash in her bilateral groin which she noticed about a month ago.  She states the rash has been stable but not going away.  She denies painful pustules or discharge.  She also saw her oncologist, Dr. Mike Gip for lung nodule.  She underwent bronchoscopy with biopsy, sample was limited but did not show malignancy.  Patient did not want to pursue further intervention at this time other than monitoring by imaging.  She has been taking lactulose and rifaximin, reports having regular bowel movements  She is single, lives with a roommate  NSAIDs: none including BC powder or Goody powder  Antiplts/Anticoagulants/Anti thrombotics: none  GI Procedures:  EGD 11/19/2018 - Normal duodenal bulb and second portion of the duodenum. - Gastric antral vascular ectasia with bleeding. Treated with argon plasma coagulation (APC). - Normal gastroesophageal junction and esophagus. - No specimens collected.  EGD 08/03/2018 - Normal duodenal bulb and second portion of the duodenum. - Portal hypertensive gastropathy. - Bleeding large (> 5 mm) esophageal varices, ligation was unsuccessful. - No specimens collected.  EGD 04/22/2018 - Normal duodenal bulb and second portion of the duodenum. - Non-bleeding gastric ulcer with a clean ulcer base (Forrest Class III). - Portal hypertensive gastropathy. - Large (> 5 mm) esophageal varices. Incompletely eradicated. Banded x 2. - No specimens collected.  EGD 03/25/2018 - Two non-bleeding angioectasias in the duodenum. Treated with argon plasma coagulation (APC). - A few  non-bleeding angioectasias in the stomach. Treated with argon plasma coagulation (APC). - Non-bleeding gastric ulcers with a clean ulcer base (Forrest Class III). - Non-bleeding large (> 5 mm) esophageal varices. Incompletely eradicated. Banded. - No specimens collected.   Colonoscopy 03/25/2018 - One 5 mm polyp in the ascending colon, removed with a cold snare. Resected and retrieved. - Rectal varices. - External hemorrhoids. - The examination was otherwise normal.  DIAGNOSIS:  A. COLON POLYP, ASCENDING; COLD SNARE:  - TUBULAR ADENOMA.  - NEGATIVE FOR HIGH-GRADE DYSPLASIA AND MALIGNANCY.  EGD 02/09/18 The examined duodenum was normal. The stomach was normal. Five columns of non-bleeding grade III varices were found in the lower third of the esophagus,. Stigmata of recent bleeding were evident and red wale signs were present. Ten bands were successfully placed with incomplete eradication of varices. There was no bleeding during, and at the end, of the procedure.  Past Medical History:  Diagnosis Date   Acute GI bleeding 02/09/2018   Acute upper gastrointestinal bleeding 08/03/2018   Anemia    TAKES IRON TAB   Arthritis    SHOULDER   B12 deficiency 02/18/2018  Bleeding    GI  3/19   Bronchitis    HX OF   Cirrhosis ([place])    Colon cancer screening    Diabetes mellitus without complication ([place])    TYPE 2   Fatty (change of) liver, not elsewhere classified    Full dentures    UPPER AND LOWER   Iron deficiency anemia due to chronic blood loss 02/18/2018   Lung nodule, multiple    Pernicious anemia 02/22/2018   PUD (peptic ulcer disease)    Raynaud's syndrome    Upper GI bleeding 08/03/2018    Past Surgical History:  Procedure Laterality Date   CATARACT EXTRACTION W/PHACO Right 02/06/2018   Procedure: CATARACT EXTRACTION PHACO AND INTRAOCULAR LENS PLACEMENT ([place]) RIGHT DIABETIC;  Surgeon: Leandrew Koyanagi, MD;  Location: [facility];  Service:  Ophthalmology;  Laterality: Right;  DIABETIC, ORAL MED   CATARACT EXTRACTION W/PHACO Left 04/30/2018   Procedure: CATARACT EXTRACTION PHACO AND INTRAOCULAR LENS PLACEMENT ([facility]);  Surgeon: Leandrew Koyanagi, MD;  Location: [facility];  Service: Ophthalmology;  Laterality: Left;  [place] 01:04 AP% 21.3 CDE 13.66 Fluid pack lot # [id number]   COLONOSCOPY WITH PROPOFOL N/A 03/25/2018   Procedure: COLONOSCOPY WITH PROPOFOL;  Surgeon: Lin Landsman, MD;  Location: [facility] ENDOSCOPY;  Service: Gastroenterology;  Laterality: N/A;   DENTAL SURGERY     EXTRACTIONS   ELECTROMAGNETIC NAVIGATION BROCHOSCOPY Right 01/27/2019   Procedure: ELECTROMAGNETIC NAVIGATION BRONCHOSCOPY;  Surgeon: [name], MD;  Location: [facility];  Service: Cardiopulmonary;  Laterality: Right;   ESOPHAGOGASTRODUODENOSCOPY N/A 08/03/2018   Procedure: ESOPHAGOGASTRODUODENOSCOPY (EGD);  Surgeon: Lin Landsman, MD;  Location: [facility] ENDOSCOPY;  Service: Gastroenterology;  Laterality: N/A;   ESOPHAGOGASTRODUODENOSCOPY (EGD) WITH PROPOFOL N/A 02/10/2018   Procedure: ESOPHAGOGASTRODUODENOSCOPY (EGD) WITH PROPOFOL;  Surgeon: Jonathon Bellows, MD;  Location: [facility] ENDOSCOPY;  Service: Gastroenterology;  Laterality: N/A;   ESOPHAGOGASTRODUODENOSCOPY (EGD) WITH PROPOFOL N/A 03/25/2018   Procedure: ESOPHAGOGASTRODUODENOSCOPY (EGD) WITH PROPOFOL;  Surgeon: Lin Landsman, MD;  Location: [facility] ENDOSCOPY;  Service: Gastroenterology;  Laterality: N/A;   ESOPHAGOGASTRODUODENOSCOPY (EGD) WITH PROPOFOL N/A 04/22/2018   Procedure: ESOPHAGOGASTRODUODENOSCOPY (EGD) WITH PROPOFOL with band ligation;  Surgeon: Lin Landsman, MD;  Location: [place];  Service: Gastroenterology;  Laterality: N/A;   ESOPHAGOGASTRODUODENOSCOPY (EGD) WITH PROPOFOL N/A 06/24/2018   Procedure: ESOPHAGOGASTRODUODENOSCOPY (EGD) WITH PROPOFOL;  Surgeon: Lin Landsman, MD;  Location: [facility] ENDOSCOPY;  Service: Gastroenterology;  Laterality: N/A;    ESOPHAGOGASTRODUODENOSCOPY (EGD) WITH PROPOFOL N/A 11/19/2018   Procedure: ESOPHAGOGASTRODUODENOSCOPY (EGD) WITH PROPOFOL;  Surgeon: Lin Landsman, MD;  Location: [place];  Service: Gastroenterology;  Laterality: N/A;   EYE SURGERY     HEMORRHOID BANDING  03/25/2018   Procedure: HEMORRHOID BANDING;  Surgeon: Lin Landsman, MD;  Location: [facility] ENDOSCOPY;  Service: Gastroenterology;;   IR EMBO ART  VEN HEMORR LYMPH EXTRAV  INC GUIDE ROADMAPPING  08/07/2018   IR RADIOLOGIST EVAL & MGMT  09/03/2018   IR RADIOLOGIST EVAL & MGMT  12/17/2018   IR TIPS  08/07/2018   RADIOLOGY WITH ANESTHESIA N/A 08/07/2018   Procedure: TIPS;  Surgeon: Corrie Mckusick, DO;  Location: [place];  Service: Anesthesiology;  Laterality: N/A;   TONSILLECTOMY       Current Outpatient Medications:    cefdinir (OMNICEF) 300 MG capsule, , Disp: , Rfl:    cyanocobalamin (,VITAMIN B-12,) 1000 MCG/ML injection, Inject 1,000 mcg into the muscle every 30 (thirty) days. , Disp: , Rfl:    furosemide (LASIX) 20 MG tablet, Take 2 tablets (40 mg  total) by mouth daily., Disp: 60 tablet, Rfl: 5   hydrOXYzine (ATARAX/VISTARIL) 10 MG tablet, TAKE 1 TABLET (10 MG TOTAL) BY MOUTH 3 (THREE) TIMES DAILY AS NEEDED FOR ITCHING., Disp: 30 tablet, Rfl: 0   lactulose (CHRONULAC) 10 GM/15ML solution, Take 20 g by mouth 2 (two) times a day. , Disp: , Rfl:    metFORMIN (GLUCOPHAGE) 500 MG tablet, Take 1,000 mg by mouth 2 (two) times daily with a meal. , Disp: , Rfl:    mupirocin ointment (BACTROBAN) 2 %, , Disp: , Rfl:    nystatin (MYCOSTATIN/NYSTOP) powder, Apply 1 g topically as needed (rash). , Disp: , Rfl:    ondansetron (ZOFRAN) 4 MG tablet, Take 1 tablet (4 mg total) by mouth every 6 (six) hours as needed for nausea., Disp: 20 tablet, Rfl: 0   ONE TOUCH ULTRA TEST test strip, 2 (two) times daily., Disp: , Rfl: 5   ONETOUCH DELICA LANCETS 19J MISC, 2 (two) times daily., Disp: , Rfl: 5   rifaximin (XIFAXAN) 550 MG TABS  tablet, TAKE 1 TABLET (550 MG TOTAL) BY MOUTH 2 (TWO) TIMES DAILY., Disp: 60 tablet, Rfl: 5   spironolactone (ALDACTONE) 50 MG tablet, Take 2 tablets (100 mg total) by mouth daily., Disp: 60 tablet, Rfl: 5   cephALEXin (KEFLEX) 500 MG capsule, TAKE 1 CAPSULE BY MOUTH THREE TIMES A DAY, Disp: , Rfl:    fluconazole (DIFLUCAN) 150 MG tablet, Take 150 mg by mouth daily., Disp: , Rfl:    pantoprazole (PROTONIX) 40 MG tablet, Take 1 tablet (40 mg total) by mouth 2 (two) times daily., Disp: 60 tablet, Rfl: 0   Family History  Problem Relation Age of Onset   CAD Father    Heart attack Father 6   Cancer Maternal Uncle    Seizures Mother      Social History   Tobacco Use   Smoking status: Never Smoker   Smokeless tobacco: Never Used  Substance Use Topics   Alcohol use: Not Currently    Frequency: Never    Comment: No EtOH for 30 years.  Never a heavy drinker.   Drug use: Never    Allergies as of 05/20/2019   (No Known Allergies)    Review of Systems:    All systems reviewed and negative except where noted in HPI.   Physical Exam:  BP 111/68 (BP Location: Left Arm, Patient Position: Sitting, Cuff Size: Large)    Pulse 67    Temp 98.2 F (36.8 C)    Resp 16    Ht 5' 3" (1.6 m)    Wt 173 lb 12.8 oz (78.8 kg)    LMP  (LMP Unknown)    BMI 30.79 kg/m  No LMP recorded (lmp unknown). Patient is postmenopausal.  General:   Alert,  Well-developed, well-nourished, pleasant and cooperative in NAD, lethargic Head:  Normocephalic and atraumatic. Eyes:  Sclera clear, no icterus.   Conjunctiva pink. Ears:  Normal auditory acuity. Nose:  No deformity, discharge, or lesions. Mouth:  No deformity or lesions,oropharynx pink & moist. Neck:  Supple; no masses or thyromegaly. Lungs:  Respirations even and unlabored.  Clear throughout to auscultation.   No wheezes, crackles, or rhonchi. No acute distress. Heart:  Regular rate and rhythm; no murmurs, clicks, rubs, or gallops. Abdomen:   Normal bowel sounds. Soft, non-tender and nondistended, without masses, hepatosplenomegaly or hernias noted.  No guarding or rebound tenderness.   Rectal: Not performed Msk:  Symmetrical without gross deformities. muscle wasting in bilateral  upper extremities Good, equal movement & strength bilaterally. Pulses:  Normal pulses noted. Extremities:  No clubbing, trace edema, no cyanosis Neurologic:  Alert and oriented x3;  grossly normal neurologically. Skin: telangiectasias on upper anterior chest, the rash in her bilateral groin appears like a erythematous ring with central clearing, dry scaly margins Lymph Nodes:  No significant cervical adenopathy. Psych:  Alert and cooperative. Normal mood and affect.  Imaging Studies: Reviewed. CT A/P with contrast on 02/09/2018 revealed mildly enlarged retroperitoneal lymph nodes, largest with 11 mm concerning for metastatic disease or malignancy,18 mm spiculated density in the right lower lobe concerning for malignancy associated with small left pleural effusion  Assessment and Plan:   Crystal [name] is a 68 y.o. Caucasian female with metabolic syndrome, with decompensated cirrhosis secondary to ascites and refractory variceal bleed from esophageal varices status post TIPS and coil embolization of the left gastric vein here for follow-up  Decompensated cirrhosis: child class B, MELD-Na 9 Secondary liver disease workup positive for HCV antibodies, but viral load not detected, anti-smooth muscle antibodies weakly positive. 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 Ceruloplasmin, antimitochondrial antibodies, hepatitis B, alpha-1 antitrypsin came back negative Portal hypertension: manifested as ascites, esophageal varices, splenomegaly S/p TIPS: Secondary to refractory variceal bleed. Follow-up with interventional radiology every 6 months.  TIPS patent as of 08/2018 Ascites/volume overload:currently euvolemic,  continue Lasix 40 mg once a day, spironolactone 100 mg daily.  Advised her to increase Lasix to 2 times a day if she notices worsening swelling of legs. Suggested her to check weight daily, continue low-sodium diet. Avoid processed meats, and foods, chips, carbonated beverages.  Patient evaluated by cardiology, stable cardiac function, advised no further intervention at this time. Varices: variceal bleed from esophageal varices, status post EGD on 02/10/2018, EVLx10, status post EGD 03/25/2018 EVLx4, s/p EGD 04/22/18 EVLx2 and hemospray, refractory variceal bleed 08/03/2018, status post TIPS with coil embolization of left gastric vein on 08/07/2018.  Stopped propranolol  Repeat EGD in 11/2018 revealed no varices at all, GAVE treated with APC.   Pruritus: Start hydroxyzine 10 mg 3 times a day as needed most likely secondary to cholestasis.  Stable alkaline phosphatase  Anemia: Resolved, multifactorial- combination of peptic ulcer disease, gastric AVMs, portal hypertensive gastropathy, esophageal varices, GAVE. she has developed severe iron deficiency anemia secondary to variceal hemorrhage, peptic ulcer disease, gastric AVMs and portal hypertensive gastropathy. Hemoglobin  stable , iron studies revealed improved ferritin levels, B12 deficiency has resolved. There is no evidence of coagulopathy.  She has mild thrombocytopenia HCC screening: AFP normal, no liver lesions on CT in 07/2018.  Repeat ultrasound to be scheduled PSE: Started on lactulose post TIPS.  Continue rifaximin 550 mg twice daily, long-term.  HRS: none Hepatic hydrothorax: none  Health maintenance:  She is immune to hepatitis B She received Pneumovax and influenza vaccine in 07/2018 vitamin D levels 29.2 on 03/15/2018 Avoid excess Tylenol and NSAIDs use Colon cancer screening:underwent colonoscopy on 04/11/2018, One 5 mm polyp in the ascending colon, removed with a cold snare, Patch showed tubular adenoma with no evidence of high-grade  dysplasia or malignancy. Recommend repeat colonoscopy in 5 years Right lower lobe lung lesion and retroperitoneal lymphadenopathy: follow-up with Dr. Mike Gip, no evidence of retroperitoneal lymphadenopathy. Repeat CT chest revealed that the lesions were stable.  Patient underwent bronchoscopy, sample was limited, no evidence of malignancy.  Plan to monitor the lesion with imaging for now  Follow up in 6 months  [name], MD

## 2019-05-21 ENCOUNTER — Other Ambulatory Visit: Payer: Self-pay

## 2019-05-21 LAB — VITAMIN D 25 HYDROXY (VIT D DEFICIENCY, FRACTURES): Vit D, 25-Hydroxy: 13.8 ng/mL — ABNORMAL LOW (ref 30.0–100.0)

## 2019-05-21 MED ORDER — VITAMIN D (ERGOCALCIFEROL) 1.25 MG (50000 UNIT) PO CAPS: 50000.0000 [IU] | ORAL_CAPSULE | ORAL | 0 refills | Status: DC

## 2019-05-23 DIAGNOSIS — R69 Illness, unspecified: Secondary | ICD-10-CM | POA: Diagnosis not present

## 2019-05-27 ENCOUNTER — Other Ambulatory Visit: Payer: Self-pay

## 2019-05-27 ENCOUNTER — Ambulatory Visit
Admission: RE | Admit: 2019-05-27 | Discharge: 2019-05-27 | Disposition: A | Discharge status: Home / Self Care | Payer: Medicare HMO | Source: Ambulatory Visit | Attending: Gastroenterology | Admitting: Gastroenterology

## 2019-05-27 DIAGNOSIS — K8020 Calculus of gallbladder without cholecystitis without obstruction: Secondary | ICD-10-CM | POA: Diagnosis not present

## 2019-05-27 DIAGNOSIS — K7469 Other cirrhosis of liver: Secondary | ICD-10-CM | POA: Diagnosis not present

## 2019-05-27 DIAGNOSIS — K7460 Unspecified cirrhosis of liver: Secondary | ICD-10-CM | POA: Diagnosis not present

## 2019-06-01 ENCOUNTER — Other Ambulatory Visit: Payer: Self-pay | Admitting: Gastroenterology

## 2019-06-01 DIAGNOSIS — I8511 Secondary esophageal varices with bleeding: Secondary | ICD-10-CM

## 2019-06-01 DIAGNOSIS — I8510 Secondary esophageal varices without bleeding: Secondary | ICD-10-CM

## 2019-06-01 DIAGNOSIS — R188 Other ascites: Secondary | ICD-10-CM

## 2019-06-01 DIAGNOSIS — K7460 Unspecified cirrhosis of liver: Secondary | ICD-10-CM

## 2019-06-01 DIAGNOSIS — D500 Iron deficiency anemia secondary to blood loss (chronic): Secondary | ICD-10-CM

## 2019-06-02 ENCOUNTER — Other Ambulatory Visit: Payer: Self-pay | Admitting: Gastroenterology

## 2019-06-02 DIAGNOSIS — L299 Pruritus, unspecified: Secondary | ICD-10-CM

## 2019-06-02 DIAGNOSIS — Z95828 Presence of other vascular implants and grafts: Secondary | ICD-10-CM

## 2019-06-02 DIAGNOSIS — K7469 Other cirrhosis of liver: Secondary | ICD-10-CM

## 2019-06-03 ENCOUNTER — Other Ambulatory Visit: Payer: Self-pay | Admitting: Gastroenterology

## 2019-06-04 ENCOUNTER — Ambulatory Visit: Payer: Medicare HMO

## 2019-06-04 DIAGNOSIS — M79641 Pain in right hand: Secondary | ICD-10-CM | POA: Diagnosis not present

## 2019-06-04 DIAGNOSIS — M79644 Pain in right finger(s): Secondary | ICD-10-CM | POA: Diagnosis not present

## 2019-06-05 DIAGNOSIS — M79644 Pain in right finger(s): Secondary | ICD-10-CM | POA: Diagnosis not present

## 2019-06-09 ENCOUNTER — Inpatient Hospital Stay: Payer: Medicare HMO | Attending: Hematology and Oncology

## 2019-06-09 ENCOUNTER — Telehealth: Payer: Self-pay | Admitting: Gastroenterology

## 2019-06-09 ENCOUNTER — Other Ambulatory Visit: Payer: Self-pay

## 2019-06-09 DIAGNOSIS — E538 Deficiency of other specified B group vitamins: Secondary | ICD-10-CM | POA: Diagnosis present

## 2019-06-09 MED ORDER — CYANOCOBALAMIN 1000 MCG/ML IJ SOLN
1000.0000 ug | Freq: Once | INTRAMUSCULAR | Status: AC
  Administered 2019-06-09: 1000 ug via INTRAMUSCULAR

## 2019-06-09 NOTE — Patient Instructions (Signed)
Cyanocobalamin, Vitamin B12 injection ?y l thu?c g? CYANOCOBALAMIN l m?t d?ng nhn t?o c?a vitamin B12. Vitamin B12 r?t quan tr?ng cho s? pht tri?n c?a t? bo mu kh?e m?nh, cc t? bo th?n kinh, v cc proteins trong c? th?. N c?ng gip ch cho s? chuy?n ha ch?t bo v carbohydrate. Thu?c ny ???c dng ?? ?i?u tr? cho nh?ng ng??i khng th? h?p th? ?? vitamin B12. Thu?c ny c th? ???c dng cho nh?ng m?c ?ch khc; hy h?i ng??i cung c?p d?ch v? y t? ho?c d??c s? c?a mnh, n?u qu v? c th?c m?c. (CC) NHN HI?U PH? BI?N: B-12 Compliance Kit, B-12 Injection Kit, Cyomin, LA-12, Nutri-Twelve, Physicians EZ Use B-12, Primabalt Ti c?n ph?i bo cho ng??i cung c?p d?ch v? y t? c?a mnh ?i?u g tr??c khi dng thu?c ny? H? c?n bi?t li?u qu v? hi?n c b?t k? tnh tr?ng no sau ?y hay khng:  b?nh th?n  b?nh [place]  b?nh thi?u mu h?ng c?u kh?ng l?  pha?n ??ng b?t th???ng ho??c di? ??ng v??i cyanocobalamin ho?c cobalt  pha?n ??ng b?t th???ng ho??c di? ??ng v??i ca?c d??c ph?m kha?c, th?c ph?m, thu?c nhu?m, ho??c ch?t ba?o qua?n  ?ang c thai ho??c ??nh co? thai  ?ang cho con bu? Ti nn s? d?ng thu?c ny nh? th? no? Thu?c ny ?? tim vo b?p th?t ho?c tim su d??i da. Thu?c th??ng ???c s? d?ng b?i chuyn vin y t? ? phng m?ch ho?c v?n phng c?a bc s?[place] nhin, bc s? c th? d?y cho qu v? cch t? tim thu?c. Hy lm theo t?t c? cc h??ng d?n. Hy bn v?i bc s? nhi khoa c?a qu v? v? vi?c dng thu?c ny ? tr? em. C th? c?n ch?m [place] ??c bi?t. Qu li?u: N?u qu v? cho r?ng mnh ? dng qu nhi?u thu?c ny, th hy lin l?c v?i trung tm ki?m sot ch?t ??c ho?c phng c?p c?u ngay l?p t?c. L?U : Thu?c ny ch? dnh ring cho qu v?. Khng chia s? thu?c ny v?i nh?ng ng??i khc. N?u ti l? qun m?t li?u th sao? N?u qu v? ???c tim thu?c t?i m?t phng m?ch ho?c v?n phng bc s?, hy g?i t?i ? ?? ??t l?i cu?c h?n. N?u qu v? t? tim thu?c cho mnh v qu v? l? qun m?t li?u, th  hy tim li?u thu?c ? ngay khi c th?. N?u h?u nh? ? ??n gi? dng li?u thu?c k? ti?p, th ch? dng li?u thu?c k? ti?p ?Marland Kitchen Khng ???c dng li?u g?p ?i ho?c dng thm li?u. Nh?ng g c th? t??ng tc v?i thu?c ny?  colchicine  u?ng nhi?u r??u Danh sch ny c th? khng m t? ?? h?t cc t??ng tc c th? x?y ra. Hy ??a cho ng??i cung c?p d?ch v? y t? c?a mnh danh sch t?t c? cc thu?c, th?o d??c, cc thu?c khng c?n toa, ho?c cc ch? ph?m b? sung m qu v? dng. C?ng nn bo cho h? bi?t r?ng qu v? c ht thu?c, u?ng r??u, ho?c c s? d?ng ma ty tri php hay khng. Vi th? c th? t??ng tc v?i thu?c c?a qu v?. Ti c?n ph?i theo di ?i?u g trong khi dng thu?c ny? Hy ??n g?p bc s? ho?c [place] vin y t? ?? theo di ??nh k? s?c kh?e c?a mnh. Qu v? s? c?n ?i lm cc xt nghi?m mu ??nh k? trong khi qu v? dng thu?c ny. Qu v? c  th? c?n ph?i tun theo m?t ch? ?? ?n ??c bi?t. Hy ni v?i v?i bc s?. Hy h?n ch? u?ng r??u v trnh ht thu?c l ?? ??t ???c l?i ch t?i ?u c?a thu?c ny. Ti c th? nh?n th?y nh?ng tc d?ng ph? no khi dng thu?c ny? Nh?ng tc d?ng ph? qu v? c?n ph?i bo cho bc s? ho?c chuyn vin y t? cng s?m cng t?t:  cc ph?n ?ng d? ?ng, ch?ng h?n nh? da b? m?n ??, ng?a, n?i my ?ay, s?ng ? m?t, mi, ho?c l??i  da xanh ti  ?au ho?c t?c ng?c  th? kh kh ho?c kh th?  chng m?t  c vng da b? ??, s?ng ?au ? chn Cc tc d?ng ph? khng c?n ph?i ch?m [place] y t? (hy bo cho bc s? ho?c chuyn vin y t?, n?u cc tc d?ng ph? ny ti?p di?n ho?c gy phi?n toi):  tiu ch?y  ?au ??u Danh sch ny c th? khng m t? ?? h?t cc tc d?ng ph? c th? x?y ra. Xin g?i t?i bc s? c?a mnh ?? ???c c? v?n chuyn mn v? cc tc d?ng ph?Sander Nephew v? c th? t??ng trnh cc tc d?ng ph? cho FDA theo s? [phone]. Ti nn c?t gi? thu?c c?a mnh ? ?u? ?? ngoi t?m tay tr? em. C?t gi? ? nhi?t ?? phng t? 15 ??n 30 ?? C (60 ??n 87 ?? F). Trnh nh sng. V?t b? t?t c? thu?c ch?a dng  sau ngy h?t h?n in trn nhn thu?c ho?c bao thu?c. L?U : ?y l b?n tm t?t. N c th? khng bao hm t?t c? thng tin c th? c. N?u qu v? th?c m?c v? thu?c ny, xin trao ??i v?i bc s?, d??c s?, ho?c ng??i cung c?p d?ch v? y t? c?a mnh.  2020 Elsevier/Gold Standard (2009-10-01 00:00:00)

## 2019-06-09 NOTE — Telephone Encounter (Signed)
Pt left vm for Crystal [name] stating her rx Lactulose has been messed up she satets she only received a little tiny dose that is not even a full days worth

## 2019-06-10 NOTE — Telephone Encounter (Signed)
Spoke with Pharmacist regarding pt's lactulose. Changed quantity amount. Left vm advising pt this was done and to contact me if there is any problems.

## 2019-06-12 ENCOUNTER — Other Ambulatory Visit: Payer: Self-pay | Admitting: Interventional Radiology

## 2019-06-12 ENCOUNTER — Other Ambulatory Visit: Payer: Self-pay | Admitting: Radiology

## 2019-06-12 DIAGNOSIS — D649 Anemia, unspecified: Secondary | ICD-10-CM | POA: Diagnosis not present

## 2019-06-12 DIAGNOSIS — K7460 Unspecified cirrhosis of liver: Secondary | ICD-10-CM

## 2019-06-12 DIAGNOSIS — I8510 Secondary esophageal varices without bleeding: Secondary | ICD-10-CM

## 2019-06-12 DIAGNOSIS — M86149 Other acute osteomyelitis, unspecified hand: Secondary | ICD-10-CM | POA: Insufficient documentation

## 2019-06-12 DIAGNOSIS — E7800 Pure hypercholesterolemia, unspecified: Secondary | ICD-10-CM | POA: Diagnosis not present

## 2019-06-12 DIAGNOSIS — Z95828 Presence of other vascular implants and grafts: Secondary | ICD-10-CM

## 2019-06-12 DIAGNOSIS — K922 Gastrointestinal hemorrhage, unspecified: Secondary | ICD-10-CM | POA: Diagnosis not present

## 2019-06-12 DIAGNOSIS — E669 Obesity, unspecified: Secondary | ICD-10-CM | POA: Diagnosis not present

## 2019-06-12 DIAGNOSIS — E119 Type 2 diabetes mellitus without complications: Secondary | ICD-10-CM | POA: Diagnosis not present

## 2019-06-14 ENCOUNTER — Other Ambulatory Visit: Payer: Self-pay | Admitting: Gastroenterology

## 2019-06-16 ENCOUNTER — Other Ambulatory Visit: Payer: Self-pay | Admitting: Gastroenterology

## 2019-06-16 DIAGNOSIS — K7460 Unspecified cirrhosis of liver: Secondary | ICD-10-CM | POA: Diagnosis not present

## 2019-06-16 DIAGNOSIS — I8510 Secondary esophageal varices without bleeding: Secondary | ICD-10-CM | POA: Diagnosis not present

## 2019-06-17 LAB — COMPLETE METABOLIC PANEL WITH GFR
AG Ratio: 1 (calc) (ref 1.0–2.5)
ALT: 27 U/L (ref 6–29)
AST: 60 U/L — ABNORMAL HIGH (ref 10–35)
Albumin: 3.3 g/dL — ABNORMAL LOW (ref 3.6–5.1)
Alkaline phosphatase (APISO): 173 U/L — ABNORMAL HIGH (ref 37–153)
BUN/Creatinine Ratio: 12 (calc) (ref 6–22)
BUN: 14 mg/dL (ref 7–25)
CO2: 19 mmol/L — ABNORMAL LOW (ref 20–32)
Calcium: 10 mg/dL (ref 8.6–10.4)
Chloride: 101 mmol/L (ref 98–110)
Creat: 1.18 mg/dL — ABNORMAL HIGH (ref 0.50–0.99)
GFR, Est African American: 55 mL/min/{1.73_m2} — ABNORMAL LOW (ref >=60)
GFR, Est Non African American: 48 mL/min/{1.73_m2} — ABNORMAL LOW (ref >=60)
Globulin: 3.3 g/dL (calc) (ref 1.9–3.7)
Glucose, Bld: 122 mg/dL — ABNORMAL HIGH (ref 65–99)
Potassium: 4.2 mmol/L (ref 3.5–5.3)
Sodium: 134 mmol/L — ABNORMAL LOW (ref 135–146)
Total Bilirubin: 1.5 mg/dL — ABNORMAL HIGH (ref 0.2–1.2)
Total Protein: 6.6 g/dL (ref 6.1–8.1)

## 2019-06-17 LAB — PROTIME-INR
INR: 1.1
Prothrombin Time: 11.6 s — ABNORMAL HIGH (ref 9.0–11.5)

## 2019-06-19 ENCOUNTER — Encounter: Payer: Self-pay | Admitting: Family Medicine

## 2019-06-20 DIAGNOSIS — Z01812 Encounter for preprocedural laboratory examination: Secondary | ICD-10-CM | POA: Diagnosis not present

## 2019-06-20 DIAGNOSIS — L089 Local infection of the skin and subcutaneous tissue, unspecified: Secondary | ICD-10-CM | POA: Diagnosis not present

## 2019-06-20 DIAGNOSIS — M86641 Other chronic osteomyelitis, right hand: Secondary | ICD-10-CM | POA: Diagnosis not present

## 2019-06-20 DIAGNOSIS — I998 Other disorder of circulatory system: Secondary | ICD-10-CM | POA: Diagnosis not present

## 2019-06-24 ENCOUNTER — Ambulatory Visit
Admission: RE | Admit: 2019-06-24 | Discharge: 2019-06-24 | Disposition: A | Discharge status: Home / Self Care | Payer: Medicare HMO | Source: Ambulatory Visit | Attending: Interventional Radiology | Admitting: Interventional Radiology

## 2019-06-24 ENCOUNTER — Other Ambulatory Visit: Payer: Medicare HMO

## 2019-06-24 ENCOUNTER — Encounter: Payer: Self-pay | Admitting: *Deleted

## 2019-06-24 DIAGNOSIS — K7460 Unspecified cirrhosis of liver: Secondary | ICD-10-CM

## 2019-06-24 DIAGNOSIS — Z9889 Other specified postprocedural states: Secondary | ICD-10-CM | POA: Diagnosis not present

## 2019-06-24 DIAGNOSIS — I8500 Esophageal varices without bleeding: Secondary | ICD-10-CM | POA: Diagnosis not present

## 2019-06-24 DIAGNOSIS — K922 Gastrointestinal hemorrhage, unspecified: Secondary | ICD-10-CM | POA: Diagnosis not present

## 2019-06-24 DIAGNOSIS — I8510 Secondary esophageal varices without bleeding: Secondary | ICD-10-CM

## 2019-06-24 HISTORY — PX: IR RADIOLOGIST EVAL & MGMT: IMG5224

## 2019-06-24 NOTE — Progress Notes (Signed)
Chief Complaint: Cirrhosis, Portal HTN, Variceal Bleeding, SP TIPS  Referring Physician(s): Dr. Marius Ditch, GI Dr. Lorelee Market, PCP, Tyler Deis, [place] Dr. Mike Gip, Oncology  History of Present Illness: Crystal [name] is a 68 y.o. female presenting today as a scheduled follow up to [place] [facility], SP TIPS.  She is here by herself.   She is a very pleasant 68 year old female with history of cirrhosis and HCV, SP TIPS performed emergently as inpt, 08/07/2018.  The indication was recurrent EV bleeding, refractory to banding.   She tells me today that she feels she has been doing well at home. She has not had recent hospitalization.  She denies any hematemesis or melena.  She denies any blood transfusions recently.  She continues to see GI, last visit early July.   She was seen as urgent follow up by our office 12/17/2018 for CHF and concern that the TIPS required modification.  Her symptoms were noted to resolve after medication adjustment.  Dr. Saunders Revel of Cardiology saw here then 04/02/2019, completed his assessment, and had ECHO performed.  No changes were made at that time, and the office follow-up concluded in Epic that there were no measurable differences in her ECHO cardiac function after the TIPS.   Currently, Dr. Mike Gip taking care of her for lung nodules and adenopathy.  Cytology/biopsy from March are negative for malignancy.   Her last note reports that patient is hesitant to pursue treatment because of an estimated 2 year survival after TIPS.   ECHO performed 04/04/2019 shows normal EF (55%-60%), with right atrial pressure estimated 63mHg.    Last endoscopy was 11/19/2018.  She has 1 area of the gastric antrum treated for oozing with argon plasma coag.  Normal esophagus and GEJxn.   She has not had any need for paracentesis.    She has not had any episodes of hepatic encephalopathy. She continues to take lactulose daily.   Duplex performed today shows patent TIPS, no ascites.     Past  Medical History:  Diagnosis Date  . Acute GI bleeding 02/09/2018  . Acute upper gastrointestinal bleeding 08/03/2018  . Anemia    TAKES IRON TAB  . Arthritis    SHOULDER  . B12 deficiency 02/18/2018  . Bleeding    GI  3/19  . Bronchitis    HX OF  . Cirrhosis ([facility]   . Colon cancer screening   . Diabetes mellitus without complication ([facility]    TYPE 2  . Fatty (change of) liver, not elsewhere classified   . Full dentures    UPPER AND LOWER  . Iron deficiency anemia due to chronic blood loss 02/18/2018  . Lung nodule, multiple   . Pernicious anemia 02/22/2018  . PUD (peptic ulcer disease)   . Raynaud's syndrome   . Upper GI bleeding 08/03/2018    Past Surgical History:  Procedure Laterality Date  . CATARACT EXTRACTION W/PHACO Right 02/06/2018   Procedure: CATARACT EXTRACTION PHACO AND INTRAOCULAR LENS PLACEMENT (IPulpotio Bareas RIGHT DIABETIC;  Surgeon: BLeandrew Koyanagi MD;  [facility]  Service: Ophthalmology;  Laterality: Right;  DIABETIC, ORAL MED  . CATARACT EXTRACTION W/PHACO Left 04/30/2018   Procedure: CATARACT EXTRACTION PHACO AND INTRAOCULAR LENS PLACEMENT (IOC);  Surgeon: BLeandrew Koyanagi MD;  Location: [facility];  Service: Ophthalmology;  Laterality: Left;  [place]01:04 AP% 21.3 CDE 13.66 Fluid pack lot # [id number]  . COLONOSCOPY WITH PROPOFOL N/A 03/25/2018   Procedure: COLONOSCOPY WITH PROPOFOL;  Surgeon: VLin Landsman MD;  Location:  [facility] ENDOSCOPY;  Service: Gastroenterology;  Laterality: N/A;  . DENTAL SURGERY     EXTRACTIONS  . ELECTROMAGNETIC NAVIGATION BROCHOSCOPY Right 01/27/2019   Procedure: ELECTROMAGNETIC NAVIGATION BRONCHOSCOPY;  Surgeon: [name], MD;  Location: [facility] OR[facility];  Service: Cardiopulmonary;  Laterality: Right;  . ESOPHAGOGASTRODUODENOSCOPY N/A 08/03/2018   Procedure: ESOPHAGOGASTRODUODENOSCOPY (EGD);  Surgeon: Lin Landsman, MD;  Location[facility] END[facility]COPY;  Service: Gastroenterology;  Laterality: N/A;  .  ESOPHAGOGASTRODUODENOSCOPY (EGD) WITH PROPOFOL N/A 02/10/2018   Procedure: ESOPHAGOGASTRODUODENOSCOPY (EGD) WITH PROPOFOL;  Surgeon: Jonathon Bellows, MD;  Location: [facility] ENDOSCOPY;  Service: Gastroenterology;  Laterality: N/A;  . ESOPHAGOGASTRODUODENOSCOPY (EGD) WITH PROPOFOL N/A 03/25/2018   Procedure: ESOPHAGOGASTRODUODENOSCOPY (EGD) WITH PROPOFOL;  Surgeon: Lin Landsman, MD;  Location: [facility] ENDOSCOPY;  Service: Gastroenterology;  Laterality: N/A;  . ESOPHAGOGASTRODUODENOSCOPY (EGD) WITH PROPOFOL N/A 04/22/2018   Procedure: ESOPHAGOGASTRODUODENOSCOPY (EGD) WITH PROPOFOL with band ligation;  Surgeon: Lin Landsman, MD;  Location: [facility];  Service: Gastroenterology;  Laterality: N/A;  . ESOPHAGOGASTRODUODENOSCOPY (EGD) WITH PROPOFOL N/A 06/24/2018   Procedure: ESOPHAGOGASTRODUODENOSCOPY (EGD) WITH PROPOFOL;  Surgeon: Lin Landsman, MD;  Location: [facility] ENDOSCOPY;  Service: Gastroenterology;  Laterality: N/A;  . ESOPHAGOGASTRODUODENOSCOPY (EGD) WITH PROPOFOL N/A 11/19/2018   Procedure: ESOPHAGOGASTRODUODENOSCOPY (EGD) WITH PROPOFOL;  Surgeon: Lin Landsman, MD;  Location: [facility] ENDOSCOPY;  Service: Gastroenterology;  Laterality: N/A;  . EYE SURGERY    . HEMORRHOID BANDING  03/25/2018   Procedure: HEMORRHOID BANDING;  Surgeon: Lin Landsman, MD;  Location: [facility] ENDOSCOPY;  Service: Gastroenterology;;  . IR EMBO ART  VEN HEMORR LYMPH EXTRAV  INC GUIDE ROADMAPPING  08/07/2018  . IR RADIOLOGIST EVAL & MGMT  09/03/2018  . IR RADIOLOGIST EVAL & MGMT  12/17/2018  . IR TIPS  08/07/2018  . RADIOLOGY WITH ANESTHESIA N/A 08/07/2018   Procedure: TIPS;  Surgeon: Corrie Mckusick, DO;  Location: [place];  Service: Anesthesiology;  Laterality: N/A;  . TONSILLECTOMY      Allergies: Patient has no known allergies.  Medications: Prior to Admission medications   Medication Sig Start Date End Date Taking? Authorizing Provider  cefdinir (OMNICEF) 300 MG capsule  05/19/19   [provider]  cephALEXin (KEFLEX) 500 MG capsule TAKE 1 CAPSULE BY MOUTH THREE TIMES A DAY 04/23/19   [provider]  cyanocobalamin (,VITAMIN B-12,) 1000 MCG/ML injection Inject 1,000 mcg into the muscle every 30 (thirty) days.     [provider]  fluconazole (DIFLUCAN) 150 MG tablet Take 150 mg by mouth daily. 04/23/19   [provider]  furosemide (LASIX) 20 MG tablet Take 2 tablets (40 mg total) by mouth daily. 05/20/19 06/19/19  Lin Landsman, MD  hydrOXYzine (ATARAX/VISTARIL) 10 MG tablet TAKE 1 TABLET (10 MG TOTAL) BY MOUTH 3 (THREE) TIMES DAILY AS NEEDED FOR ITCHING. 06/02/19   Vanga, Tally Due, MD  lactulose (CHRONULAC) 10 GM/15ML solution TAKE 30 MLS (20 G TOTAL) BY MOUTH 2 (TWO) TIMES DAILY. 06/17/19   Lin Landsman, MD  metFORMIN (GLUCOPHAGE) 500 MG tablet Take 1,000 mg by mouth 2 (two) times daily with a meal.     [provider]  mupirocin ointment (BACTROBAN) 2 %  05/19/19   [provider]  nystatin (MYCOSTATIN/NYSTOP) powder Apply 1 g topically as needed (rash).  05/23/18   [provider]  ondansetron (ZOFRAN) 4 MG tablet Take 1 tablet (4 mg total) by mouth every 6 (six) hours as needed for nausea. 08/03/18   Rosine Door, MD  ONE TOUCH ULTRA TEST test strip  2 (two) times daily. 09/13/18   [provider]  [facility] DELICA LANCETS 93G MISC 2 (two) times daily. 09/13/18   [provider]  pantoprazole (PROTONIX) 40 MG tablet Take 1 tablet (40 mg total) by mouth 2 (two) times daily. 08/10/18 04/22/19  Arrien, Jimmy Picket, MD  rifaximin (XIFAXAN) 550 MG TABS tablet TAKE 1 TABLET (550 MG TOTAL) BY MOUTH 2 (TWO) TIMES DAILY. 05/20/19   Lin Landsman, MD  spironolactone (ALDACTONE) 50 MG tablet Take 2 tablets (100 mg total) by mouth daily. 05/20/19 06/19/19  Lin Landsman, MD  Vitamin D, Ergocalciferol, (DRISDOL) 1.25 MG (50000 UT) CAPS capsule TAKE 1 CAPSULE BY MOUTH ONE TIME PER WEEK 06/03/19   Lin Landsman,  MD     Family History  Problem Relation Age of Onset  . CAD Father   . Heart attack Father 67  . Cancer Maternal Uncle   . Seizures Mother     Social History   Socioeconomic History  . Marital status: Single    Spouse name: Not on file  . Number of children: Not on file  . Years of education: Not on file  . Highest education level: Not on file  Occupational History  . Not on file  Social Needs  . Financial resource strain: Not on file  . Food insecurity    Worry: Not on file    Inability: Not on file  . Transportation needs    Medical: Not on file    Non-medical: Not on file  Tobacco Use  . Smoking status: Never Smoker  . Smokeless tobacco: Never Used  Substance and Sexual Activity  . Alcohol use: Not Currently    Frequency: Never    Comment: No EtOH for 30 years.  Never a heavy drinker.  . Drug use: Never  . Sexual activity: Not on file  Lifestyle  . Physical activity    Days per week: Not on file    Minutes per session: Not on file  . Stress: Not on file  Relationships  . Social Herbalist on phone: Not on file    Gets together: Not on file    Attends religious service: Not on file    Active member of club or organization: Not on file    Attends meetings of clubs or organizations: Not on file    Relationship status: Not on file  Other Topics Concern  . Not on file  Social History Narrative   Independent at baseline. Lives at home with family      Review of Systems: A 12 point ROS discussed and pertinent positives are indicated in the HPI above.  All other systems are negative.  Review of Systems  Vital Signs: BP (!) 134/56 (BP Location: Right Arm)   LMP  (LMP Unknown)   Physical Exam General: 68 yo female appearing  stated age.  Well-developed, well-nourished.  No distress. HEENT: Atraumatic, normocephalic.  Glasses. Conjugate gaze, extra-ocular motor intact. No scleral icterus or scleral injection. No lesions on external ears, nose,  lips, or gums.  Oral mucosa moist, pink.  Neck: Symmetric with no goiter enlargement.  Chest/Lungs:  Symmetric chest with inspiration/expiration.  No labored breathing.  Clear to auscultation with no wheezes, rhonchi, or rales.  Heart:  RRR, with no third heart sounds appreciated. No JVD appreciated.  Abdomen:  Soft, NT/ND, with + bowel sounds.  No fluid wave.  Genito-urinary: Deferred Neurologic: Alert & Oriented to person, place, and time.  Normal affect and insight.  Appropriate questions.  Moving all 4 extremities with gross sensory intact.  Extremities:  Bilateral 1+ pitting edema.  Some chronic changes in the right achilles region of woody soft tissue changes. Marland Kitchen ]   Imaging: US Abdomen Limited Ruq  Result Date: 05/27/2019 CLINICAL DATA:  History of cirrhosis and prior TIPS EXAM: ULTRASOUND ABDOMEN LIMITED RIGHT UPPER QUADRANT COMPARISON:  None. FINDINGS: Gallbladder: Cholelithiasis is identified. Mild wall thickening is noted without pericholecystic fluid. Negative sonographic Murphy's sign is noted. Common bile duct: Diameter: 5.4 mm. Liver: Liver is nodular in contour with some mild heterogeneity. TIPS shunt is seen but incompletely evaluated. Portal vein is patent on color Doppler imaging with normal direction of blood flow towards the liver. IMPRESSION: Changes of cirrhosis of the liver. TIPS shunt is seen although incompletely evaluated on this exam. Cholelithiasis with gallbladder wall thickening although a negative sonographic Murphy's sign is noted. This is likely chronic in nature. Electronically Signed   By: Inez Catalina M.D.   On: 05/27/2019 10:34    Labs:  CBC: Recent Labs    09/25/18 1340 11/20/18 0945 01/16/19 1019 02/12/19 1313 04/24/19 1117  WBC 3.5* 3.9*  --  4.5 3.9*  HGB 11.5* 12.6  --  12.4 12.1  HCT 36.2 39.9  --  36.8 35.8*  PLT 131* 105* 134* 128* 129*    COAGS: Recent Labs    08/03/18 0548 08/05/18 0855 04/24/19 1117 06/16/19 1012  INR 1.16 1.14  1.2 1.1  APTT 32  --  37*  --     BMP: Recent Labs    01/07/19 1020 01/22/19 1341 04/24/19 1117 06/16/19 1012  NA 136 140 131* 134*  K 3.9 4.1 3.7 4.2  CL 102 101 104 101  CO2 20 20 17* 19*  GLUCOSE 222* 135* 85 122*  BUN _0 CALCIUM 9.2 9.8 9.1 10.0  CREATININE 0.81 0.85 0.91 1.18*  GFRNONAA 76 72 >60 48*  GFRAA 88 83 >60 55*    LIVER FUNCTION TESTS: Recent Labs    08/05/18 0445 11/20/18 0945 12/11/18 1624 04/24/19 1117 06/16/19 1012  BILITOT 0.9 1.5* 1.3* 1.5* 1.5*  AST 66* 92* 76* 61* 60*  ALT 60* 43 41* 31 27  ALKPHOS 133* 212* 254* 168*  --   PROT 5.5* 6.8 7.2 7.3 6.6  ALBUMIN 2.5* 2.8* 3.4* 3.2*  --     TUMOR MARKERS: No results for input(s): AFPTM, CEA, CA199, CHROMGRNA in the last 8760 hours.  Assessment and Plan:  Ms Daigneault is a 69 yo female with history of emergent TIPS for EV bleeding September 2019.    She has done well from standpoint of bleeding, with no recurrent epidosodes.  Last endo was January 2020.  She has had no problem with ascites, and has had no recurrent episodes of hepatic encephalopathy, taking lactulose daily.   She did have requirement of medication adjustment after concern for CHF in February of this year, and she was worked up by Dr. Saunders Revel of Cardiology for possible TIPS related heart failure.  The office notes performed late May/early June after repeat ECHO indicate no evidence the there has been any change in her heart function after the TIPS.    The ECHO does indicate an estimated RA pressure of 28mHg.  There is some literature to indicate the RA pressure above 939mg may be associated with mortality after TIPS.  At this point, however, she has had no recurrent hospitalization, has control of her  swelling symptoms with medication, and is comfortable with all her activities.   To me, it seems reasonable to proceed with work-up from Oncology stand-point.    We will schedule another follow up in 6 months with duplex.  I told her  that we can share the results by phone or in person.    Plan: - Follow up in 6 months with hepatic duplex - Continue current care - I have advised her to observe all of her future physician appointments.      Electronically Signed: Corrie Mckusick 06/24/2019, 10:22 AM   I spent a total of    40 Minutes in face to face in clinical consultation, greater than 50% of which was counseling/coordinating care for portal HTN, prior TIPS for bleeding esophageal varices.

## 2019-06-26 ENCOUNTER — Encounter: Payer: Self-pay | Admitting: Gastroenterology

## 2019-06-26 ENCOUNTER — Ambulatory Visit: Payer: Medicare HMO | Admitting: Gastroenterology

## 2019-06-26 ENCOUNTER — Other Ambulatory Visit: Payer: Self-pay

## 2019-06-26 ENCOUNTER — Ambulatory Visit (INDEPENDENT_AMBULATORY_CARE_PROVIDER_SITE_OTHER): Payer: Medicare HMO | Admitting: Gastroenterology

## 2019-06-26 VITALS — BP 124/68 | HR 80 | Temp 98.4°F | Ht 63.0 in | Wt 169.4 lb

## 2019-06-26 DIAGNOSIS — K7290 Hepatic failure, unspecified without coma: Secondary | ICD-10-CM | POA: Diagnosis not present

## 2019-06-26 DIAGNOSIS — K7460 Unspecified cirrhosis of liver: Secondary | ICD-10-CM

## 2019-06-26 NOTE — Progress Notes (Signed)
[name], MD [address]  [place]  [place], [place] [address]  Main: [phone]  Fax: [phone]    Gastroenterology Consultation  Referring Provider:     Lorelee Market, MD Primary Care Physician:  Lorelee Market, MD Primary Gastroenterologist:  Dr. [name] Reason for Consultation:     Decompensated cirrhosis        HPI:   Crystal [name] is a 68 y.o. female referred by Dr. Lorelee Market, MD  for consultation & management of decompensated cirrhosis.   Follow-up visit 06/26/2019 Patient was told to make a follow-up to see me to discuss about abnormal labs by her PCP.  I reviewed the labs from 06/12/2019 which revealed normal hemoglobin, platelets 110, normal BUN/creatinine, total bilirubin 2, alkaline phosphatase 170.  She had repeat labs at Dr. Pasty Arch office in [place] on 8/3, alkaline phosphatase 173, AST 60, total bilirubin 1.5, creatinine 1.18.  Patient reports she has been doing very well.  She denies any swelling of legs, dark urine, pruritus, abdominal pain, melena.  She feels she has remained the same from liver standpoint.  She is requesting lactulose for 3 months supply  She is single, lives with a roommate  NSAIDs: none including BC powder or Goody powder  Antiplts/Anticoagulants/Anti thrombotics: none  GI Procedures:  EGD 11/19/2018 - Normal duodenal bulb and second portion of the duodenum. - Gastric antral vascular ectasia with bleeding. Treated with argon plasma coagulation (APC). - Normal gastroesophageal junction and esophagus. - No specimens collected.  EGD 08/03/2018 - Normal duodenal bulb and second portion of the duodenum. - Portal hypertensive gastropathy. - Bleeding large (> 5 mm) esophageal varices, ligation was unsuccessful. - No specimens collected.  EGD 04/22/2018 - Normal duodenal bulb and second portion of the duodenum. - Non-bleeding gastric ulcer with a clean ulcer base (Forrest Class III). - Portal  hypertensive gastropathy. - Large (> 5 mm) esophageal varices. Incompletely eradicated. Banded x 2. - No specimens collected.  EGD 03/25/2018 - Two non-bleeding angioectasias in the duodenum. Treated with argon plasma coagulation (APC). - A few non-bleeding angioectasias in the stomach. Treated with argon plasma coagulation (APC). - Non-bleeding gastric ulcers with a clean ulcer base (Forrest Class III). - Non-bleeding large (> 5 mm) esophageal varices. Incompletely eradicated. Banded. - No specimens collected.   Colonoscopy 03/25/2018 - One 5 mm polyp in the ascending colon, removed with a cold snare. Resected and retrieved. - Rectal varices. - External hemorrhoids. - The examination was otherwise normal.  DIAGNOSIS:  A. COLON POLYP, ASCENDING; COLD SNARE:  - TUBULAR ADENOMA.  - NEGATIVE FOR HIGH-GRADE DYSPLASIA AND MALIGNANCY.  EGD 02/09/18 The examined duodenum was normal. The stomach was normal. Five columns of non-bleeding grade III varices were found in the lower third of the esophagus,. Stigmata of recent bleeding were evident and red wale signs were present. Ten bands were successfully placed with incomplete eradication of varices. There was no bleeding during, and at the end, of the procedure.  Past Medical History:  Diagnosis Date  . Acute GI bleeding 02/09/2018  . Acute upper gastrointestinal bleeding 08/03/2018  . Anemia    TAKES IRON TAB  . Arthritis    SHOULDER  . B12 deficiency 02/18/2018  . Bleeding    GI  3/19  . Bronchitis    HX OF  . Cirrhosis ([place])   . Colon cancer screening   . Diabetes mellitus without complication ([place])    TYPE 2  . Fatty (change of) liver, not  elsewhere classified   . Full dentures    UPPER AND LOWER  . Iron deficiency anemia due to chronic blood loss 02/18/2018  . Lung nodule, multiple   . Pernicious anemia 02/22/2018  . PUD (peptic ulcer disease)   . Raynaud's syndrome   . Upper GI bleeding 08/03/2018    Past Surgical History:   Procedure Laterality Date  . CATARACT EXTRACTION W/PHACO Right 02/06/2018   Procedure: CATARACT EXTRACTION PHACO AND INTRAOCULAR LENS PLACEMENT ([place]) RIGHT DIABETIC;  Surgeon: Leandrew Koyanagi, MD;  Location: [facility];  Service: Ophthalmology;  Laterality: Right;  DIABETIC, ORAL MED  . CATARACT EXTRACTION W/PHACO Left 04/30/2018   Procedure: CATARACT EXTRACTION PHACO AND INTRAOCULAR LENS PLACEMENT ([facility]);  Surgeon: Leandrew Koyanagi, MD;  Location: [facility];  Service: Ophthalmology;  Laterality: Left;  [place] 01:04 AP% 21.3 CDE 13.66 Fluid pack lot # [id number]  . COLONOSCOPY WITH PROPOFOL N/A 03/25/2018   Procedure: COLONOSCOPY WITH PROPOFOL;  Surgeon: Lin Landsman, MD;  Location: [facility] END[facility]COPY;  Service: Gastroenterology;  Laterality: N/A;  . DENTAL SURGERY     EXTRACTIONS  . ELECTROMAGNETIC NAVIGATION BROCHOSCOPY Right 01/27/2019   Procedure: ELECTROMAGNETIC NAVIGATION BRONCHOSCOPY;  Surgeon: [name], MD;  Location: [facility];  Service: Cardiopulmonary;  Laterality: Right;  . ESOPHAGOGASTRODUODENOSCOPY N/A 08/03/2018   Procedure: ESOPHAGOGASTRODUODENOSCOPY (EGD);  Surgeon: Lin Landsman, MD;  Location: [facility] ENDOSCOPY;  Service: Gastroenterology;  Laterality: N/A;  . ESOPHAGOGASTRODUODENOSCOPY (EGD) WITH PROPOFOL N/A 02/10/2018   Procedure: ESOPHAGOGASTRODUODENOSCOPY (EGD) WITH PROPOFOL;  Surgeon: Jonathon Bellows, MD;  Location: [facility] ENDOSCOPY;  Service: Gastroenterology;  Laterality: N/A;  . ESOPHAGOGASTRODUODENOSCOPY (EGD) WITH PROPOFOL N/A 03/25/2018   Procedure: ESOPHAGOGASTRODUODENOSCOPY (EGD) WITH PROPOFOL;  Surgeon: Lin Landsman, MD;  Location: [facility] ENDOSCOPY;  Service: Gastroenterology;  Laterality: N/A;  . ESOPHAGOGASTRODUODENOSCOPY (EGD) WITH PROPOFOL N/A 04/22/2018   Procedure: ESOPHAGOGASTRODUODENOSCOPY (EGD) WITH PROPOFOL with band ligation;  Surgeon: Lin Landsman, MD;  Location: [place];  Service: Gastroenterology;  Laterality: N/A;  .  ESOPHAGOGASTRODUODENOSCOPY (EGD) WITH PROPOFOL N/A 06/24/2018   Procedure: ESOPHAGOGASTRODUODENOSCOPY (EGD) WITH PROPOFOL;  Surgeon: Lin Landsman, MD;  Location: [facility] ENDOSCOPY;  Service: Gastroenterology;  Laterality: N/A;  . ESOPHAGOGASTRODUODENOSCOPY (EGD) WITH PROPOFOL N/A 11/19/2018   Procedure: ESOPHAGOGASTRODUODENOSCOPY (EGD) WITH PROPOFOL;  Surgeon: Lin Landsman, MD;  Location: [facility] ENDOSCOPY;  Service: Gastroenterology;  Laterality: N/A;  . EYE SURGERY    . HEMORRHOID BANDING  03/25/2018   Procedure: HEMORRHOID BANDING;  Surgeon: Lin Landsman, MD;  Location: [facility] ENDOSCOPY;  Service: Gastroenterology;;  . IR EMBO ART  VEN HEMORR LYMPH EXTRAV  INC GUIDE ROADMAPPING  08/07/2018  . IR RADIOLOGIST EVAL & MGMT  09/03/2018  . IR RADIOLOGIST EVAL & MGMT  12/17/2018  . IR RADIOLOGIST EVAL & MGMT  06/24/2019  . IR TIPS  08/07/2018  . RADIOLOGY WITH ANESTHESIA N/A 08/07/2018   Procedure: TIPS;  Surgeon: Corrie Mckusick, DO;  Location: [place];  Service: Anesthesiology;  Laterality: N/A;  . TONSILLECTOMY       Current Outpatient Medications:  .  cefdinir (OMNICEF) 300 MG capsule, , Disp: , Rfl:  .  cephALEXin (KEFLEX) 500 MG capsule, TAKE 1 CAPSULE BY MOUTH THREE TIMES A DAY, Disp: , Rfl:  .  cyanocobalamin (,VITAMIN B-12,) 1000 MCG/ML injection, Inject 1,000 mcg into the muscle every 30 (thirty) days. , Disp: , Rfl:  .  doxycycline (VIBRAMYCIN) 100 MG capsule, doxycycline hyclate 100 mg capsule  Take 1 capsule twice a day by oral route for 21 days., Disp: , Rfl:  .  fluconazole (DIFLUCAN) 150 MG tablet, Take 150 mg by mouth daily., Disp: , Rfl:  .  hydrOXYzine (ATARAX/VISTARIL) 10 MG tablet, TAKE 1 TABLET (10 MG TOTAL) BY MOUTH 3 (THREE) TIMES DAILY AS NEEDED FOR ITCHING., Disp: 30 tablet, Rfl: 0 .  lactulose (CHRONULAC) 10 GM/15ML solution, TAKE 30 MLS (20 G TOTAL) BY MOUTH 2 (TWO) TIMES DAILY., Disp: 473 mL, Rfl: 0 .  metFORMIN (GLUCOPHAGE) 500 MG tablet, Take 1,000 mg by mouth 2  (two) times daily with a meal. , Disp: , Rfl:  .  mupirocin ointment (BACTROBAN) 2 %, , Disp: , Rfl:  .  nystatin (MYCOSTATIN/NYSTOP) powder, Apply 1 g topically as needed (rash). , Disp: , Rfl:  .  omeprazole (PRILOSEC) 40 MG capsule, omeprazole 40 mg capsule,delayed release  TAKE 1 CAPSULE BY MOUTH EVERY DAY, Disp: , Rfl:  .  ondansetron (ZOFRAN) 4 MG tablet, Take 1 tablet (4 mg total) by mouth every 6 (six) hours as needed for nausea., Disp: 20 tablet, Rfl: 0 .  ONE TOUCH ULTRA TEST test strip, 2 (two) times daily., Disp: , Rfl: 5 .  ONETOUCH DELICA LANCETS 89Q MISC, 2 (two) times daily., Disp: , Rfl: 5 .  rifaximin (XIFAXAN) 550 MG TABS tablet, TAKE 1 TABLET (550 MG TOTAL) BY MOUTH 2 (TWO) TIMES DAILY., Disp: 60 tablet, Rfl: 5 .  sulfamethoxazole-trimethoprim (BACTRIM DS) 800-160 MG tablet, sulfamethoxazole 800 mg-trimethoprim 160 mg tablet, Disp: , Rfl:  .  traMADol (ULTRAM) 50 MG tablet, , Disp: , Rfl:  .  Vitamin D, Ergocalciferol, (DRISDOL) 1.25 MG (50000 UT) CAPS capsule, TAKE 1 CAPSULE BY MOUTH ONE TIME PER WEEK, Disp: 12 capsule, Rfl: 0 .  furosemide (LASIX) 20 MG tablet, Take 2 tablets (40 mg total) by mouth daily., Disp: 60 tablet, Rfl: 5 .  spironolactone (ALDACTONE) 50 MG tablet, Take 2 tablets (100 mg total) by mouth daily., Disp: 60 tablet, Rfl: 5   Family History  Problem Relation Age of Onset  . CAD Father   . Heart attack Father 54  . Cancer Maternal Uncle   . Seizures Mother      Social History   Tobacco Use  . Smoking status: Never Smoker  . Smokeless tobacco: Never Used  Substance Use Topics  . Alcohol use: Not Currently    Frequency: Never    Comment: No EtOH for 30 years.  Never a heavy drinker.  . Drug use: Never    Allergies as of 06/26/2019  . (No Known Allergies)    Review of Systems:    All systems reviewed and negative except where noted in HPI.   Physical Exam:  BP 124/68 (BP Location: Left Arm, Patient Position: Sitting, Cuff Size: Normal)    Pulse 80   Temp 98.4 F (36.9 C) (Oral)   Ht 5' 3" (1.6 m)   Wt 169 lb 6.4 oz (76.8 kg)   LMP  (LMP Unknown)   BMI 30.01 kg/m  No LMP recorded (lmp unknown). Patient is postmenopausal.  General:   Alert,  Well-developed, well-nourished, pleasant and cooperative in NAD, lethargic Head:  Normocephalic and atraumatic. Eyes:  Sclera clear, no icterus.   Conjunctiva pink. Ears:  Normal auditory acuity. Nose:  No deformity, discharge, or lesions. Mouth:  No deformity or lesions,oropharynx pink & moist. Neck:  Supple; no masses or thyromegaly. Lungs:  Respirations even and unlabored.  Clear throughout to auscultation.   No wheezes, crackles, or rhonchi. No acute distress. Heart:  Regular rate and rhythm; no murmurs, clicks, rubs,  or gallops. Abdomen:  Normal bowel sounds. Soft, non-tender and nondistended, without masses, hepatosplenomegaly or hernias noted.  No guarding or rebound tenderness.   Rectal: Not performed Msk:  Symmetrical without gross deformities. muscle wasting in bilateral upper extremities Good, equal movement & strength bilaterally. Pulses:  Normal pulses noted. Extremities:  No clubbing, trace edema, no cyanosis Neurologic:  Alert and oriented x3;  grossly normal neurologically. Skin: telangiectasias on upper anterior chest, the rash in her bilateral groin appears like a erythematous ring with central clearing, dry scaly margins Lymph Nodes:  No significant cervical adenopathy. Psych:  Alert and cooperative. Normal mood and affect.  Imaging Studies: Reviewed. CT A/P with contrast on 02/09/2018 revealed mildly enlarged retroperitoneal lymph nodes, largest with 11 mm concerning for metastatic disease or malignancy,18 mm spiculated density in the right lower lobe concerning for malignancy associated with small left pleural effusion  Assessment and Plan:   Cashae A Stanek is a 68 y.o. Caucasian female with metabolic syndrome, with decompensated cirrhosis secondary to ascites  and refractory variceal bleed from esophageal varices status post TIPS and coil embolization of the left gastric vein here for follow-up  Decompensated cirrhosis: child class B, MELD-Na 9 Secondary liver disease workup positive for HCV antibodies, but viral load not detected, anti-smooth muscle antibodies weakly positive. 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 Ceruloplasmin, antimitochondrial antibodies, hepatitis B, alpha-1 antitrypsin came back negative Portal hypertension: manifested as ascites, esophageal varices, splenomegaly S/p TIPS: Secondary to refractory variceal bleed. Follow-up with interventional radiology every 6 months.  TIPS patent as of 08/2018 Ascites/volume overload:currently euvolemic, continue Lasix 40 mg once a day, spironolactone 100 mg daily.  Advised her to increase Lasix to 2 times a day if she notices worsening swelling of legs. Suggested her to check weight daily, continue low-sodium diet. Avoid processed meats, and foods, chips, carbonated beverages.  Patient evaluated by cardiology, stable cardiac function, advised no further intervention at this time. Varices: variceal bleed from esophageal varices, status post EGD on 02/10/2018, EVLx10, status post EGD 03/25/2018 EVLx4, s/p EGD 04/22/18 EVLx2 and hemospray, refractory variceal bleed 08/03/2018, status post TIPS with coil embolization of left gastric vein on 08/07/2018.  Stopped propranolol  Repeat EGD in 11/2018 revealed no varices at all, GAVE treated with APC.   Pruritus: Continue hydroxyzine 10 mg 3 times a day as needed most likely secondary to cholestasis.  Stable alkaline phosphatase  Anemia: Resolved, multifactorial- combination of peptic ulcer disease, gastric AVMs, portal hypertensive gastropathy, esophageal varices, GAVE. she has developed severe iron deficiency anemia secondary to variceal hemorrhage, peptic ulcer disease, gastric AVMs and portal hypertensive  gastropathy. Hemoglobin  stable , iron studies revealed improved ferritin levels, B12 deficiency has resolved. There is no evidence of coagulopathy.  She has mild thrombocytopenia HCC screening: AFP normal, no liver lesions on CT in 07/2018.  Repeat ultrasound in 05/2019 with no liver lesions.  Repeat in 6 months  PSE: Started on lactulose post TIPS.  Continue rifaximin 550 mg twice daily, long-term.  HRS: none Hepatic hydrothorax: none  Health maintenance:  She is immune to hepatitis B She received Pneumovax and influenza vaccine in 07/2018 vitamin D levels 29.2 on 03/15/2018 Avoid excess Tylenol and NSAIDs use Colon cancer screening:underwent colonoscopy on 04/11/2018, One 5 mm polyp in the ascending colon, removed with a cold snare, Patch showed tubular adenoma with no evidence of high-grade dysplasia or malignancy. Recommend repeat colonoscopy in 5 years Right lower lobe lung lesion and retroperitoneal  lymphadenopathy: follow-up with Dr. Mike Gip, no evidence of retroperitoneal lymphadenopathy. Repeat CT chest revealed that the lesions were stable.  Patient underwent bronchoscopy, sample was limited, no evidence of malignancy.  Plan to monitor the lesion with imaging for now  Follow up in 4 months   [name], MD

## 2019-06-30 DIAGNOSIS — D500 Iron deficiency anemia secondary to blood loss (chronic): Secondary | ICD-10-CM | POA: Diagnosis not present

## 2019-07-02 ENCOUNTER — Ambulatory Visit: Payer: Medicare HMO

## 2019-07-03 ENCOUNTER — Other Ambulatory Visit: Payer: Medicare HMO

## 2019-07-03 DIAGNOSIS — M79644 Pain in right finger(s): Secondary | ICD-10-CM | POA: Diagnosis not present

## 2019-07-03 DIAGNOSIS — R69 Illness, unspecified: Secondary | ICD-10-CM | POA: Diagnosis not present

## 2019-07-06 NOTE — Progress Notes (Signed)
[facility]  [address] [place], [place] [address] Phone: [phone]  Fax: [phone]   Clinic Day:  07/07/2019  Referring physician: Lorelee Market, MD  Chief Complaint: Kamrin Spath Clabo is a 68 y.o. female with two right sided pulmonary nodules and lymphadenopathy who seen for 2 month assessment.  HPI: The patient was last seen in the medical oncology clinic via telemedicine on 04/22/2019. At that time, she denied any new complaints.  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  Ferritin was 22.  PT/INR was 15.2/1.2.  PTT was 37.  AFP was 2.0.  She was seen in gastroenterology by Dr. Marius Ditch on 05/20/2019 for cirrhosis. She continued on surveillance. Six-month follow-up was scheduled. She was seen again on 06/26/2019 after labs at her PCP on 06/16/2019 showed: alkaline phosphatase 173, AST 60, total bilirubin 1.5, creatinine 1.18.   She was seen in interventional radiology on 06/24/2019 by Dr. Damita Dunnings.  Abdominal/pelvic arterial/venous ultrasound doppler on 04/24/2019 revealed a patent TIPS and no evidence of persisting ascites.  45-monthfollow-up with hepatic duplex was scheduled.   Labs followed: 04/24/2019: hematocrit 35.8, hemoglobin 12.1, platelets 129,000, WBC 3,900. Sodium 137, albumin 3.2, AST 53, ALT 27, alkaline phosphatase 170, total bilirubin 2.0.  06/16/2019: creatinine 1.18, sodium 134, AST 60, ALT 27, alkaline phosphatase 173, total bilirubin 1.5.   During the interim, she has had issues with her right index finger for the past 4 months.  She was on antibiotics x 2 weeks; [place]  She has a follow-up check on 07/17/2019.  She believes her weight is down secondary to antibiotics.   Appetite is improving off antibiotics.  She denies any nausea, vomiting or diarrhea.  Diabetes is under good control.   Past Medical History:   Diagnosis Date  . Acute GI bleeding 02/09/2018  . Acute upper gastrointestinal bleeding 08/03/2018  . Anemia    TAKES IRON TAB  . Arthritis    SHOULDER  . B12 deficiency 02/18/2018  . Bleeding    GI  3/19  . Bronchitis    HX OF  . [place]osis [place]   . Colon cancer screening   . Diabetes mellitus without complication ([place]    TYPE 2  . Fatty (change of) liver, not elsewhere classified   . Full dentures    UPPER AND LOWER  . Iron deficiency anemia due to chronic blood loss 02/18/2018  . Lung nodule, multiple   . Pernicious anemia 02/22/2018  . PUD (peptic ulcer disease)   . Raynaud's syndrome   . Upper GI bleeding 08/03/2018    Past Surgical History:  Procedure Laterality Date  . CATARACT EXTRACTION W/PHACO Right 02/06/2018   Procedure: CATARACT EXTRACTION PHACO AND INTRAOCULAR LENS PLACEMENT ([place] RIGHT DIABETIC;  Surgeon: BLeandrew Koyanagi MD;  [place]  Service: Ophthalmology;  Laterality: Right;  DIABETIC, ORAL MED  . CATARACT EXTRACTION W/PHACO Left 04/30/2018   Procedure: CATARACT EXTRACTION PHACO AND INTRAOCULAR LENS PLACEMENT (IOC);  Surgeon: BLeandrew Koyanagi MD;  Location: [facility];  Service: Ophthalmology;  Laterality: Left;  [place]04 AP% 21.3 CDE 13.66 Fluid pack lot # [id number]  . COLONOSCOPY WITH PROPOFOL N/A 03/25/2018   Procedure: COLONOSCOPY WITH PROPOFOL;  Surgeon: VLin Landsman MD;  [facility];  Service: Gastroenterology;  Laterality: N/A;  . DENTAL SURGERY     EXTRACTIONS  . ELECTROMAGNETIC NAVIGATION BROCHOSCOPY Right 01/27/2019  Procedure: ELECTROMAGNETIC NAVIGATION BRONCHOSCOPY;  Surgeon: [name], MD;  Location: [facility] OR[facility];  Service: Cardiopulmonary;  Laterality: Right;  . ESOPHAGOGASTRODUODENOSCOPY N/A 08/03/2018   Procedure: ESOPHAGOGASTRODUODENOSCOPY (EGD);  Surgeon: Lin Landsman, MD;  Location: [facility] ENDOSCOPY;  Service: Gastroenterology;  Laterality: N/A;  . ESOPHAGOGASTRODUODENOSCOPY (EGD) WITH  PROPOFOL N/A 02/10/2018   Procedure: ESOPHAGOGASTRODUODENOSCOPY (EGD) WITH PROPOFOL;  Surgeon: Jonathon Bellows, MD;  Location: [facility] ENDOSCOPY;  Service: Gastroenterology;  Laterality: N/A;  . ESOPHAGOGASTRODUODENOSCOPY (EGD) WITH PROPOFOL N/A 03/25/2018   Procedure: ESOPHAGOGASTRODUODENOSCOPY (EGD) WITH PROPOFOL;  Surgeon: Lin Landsman, MD;  Location: [facility] ENDOSCOPY;  Service: Gastroenterology;  Laterality: N/A;  . ESOPHAGOGASTRODUODENOSCOPY (EGD) WITH PROPOFOL N/A 04/22/2018   Procedure: ESOPHAGOGASTRODUODENOSCOPY (EGD) WITH PROPOFOL with band ligation;  Surgeon: Lin Landsman, MD;  Location: [place];  Service: Gastroenterology;  Laterality: N/A;  . ESOPHAGOGASTRODUODENOSCOPY (EGD) WITH PROPOFOL N/A 06/24/2018   Procedure: ESOPHAGOGASTRODUODENOSCOPY (EGD) WITH PROPOFOL;  Surgeon: Lin Landsman, MD;  Location: [facility] ENDOSCOPY;  Service: Gastroenterology;  Laterality: N/A;  . ESOPHAGOGASTRODUODENOSCOPY (EGD) WITH PROPOFOL N/A 11/19/2018   Procedure: ESOPHAGOGASTRODUODENOSCOPY (EGD) WITH PROPOFOL;  Surgeon: Lin Landsman, MD;  Location: [facility] ENDOSCOPY;  Service: Gastroenterology;  Laterality: N/A;  . EYE SURGERY    . HEMORRHOID BANDING  03/25/2018   Procedure: HEMORRHOID BANDING;  Surgeon: Lin Landsman, MD;  Location: [facility] ENDOSCOPY;  Service: Gastroenterology;;  . IR EMBO ART  VEN HEMORR LYMPH EXTRAV  INC GUIDE ROADMAPPING  08/07/2018  . IR RADIOLOGIST EVAL & MGMT  09/03/2018  . IR RADIOLOGIST EVAL & MGMT  12/17/2018  . IR RADIOLOGIST EVAL & MGMT  06/24/2019  . IR TIPS  08/07/2018  . RADIOLOGY WITH ANESTHESIA N/A 08/07/2018   Procedure: TIPS;  Surgeon: Corrie Mckusick, DO;  Location: [place];  Service: Anesthesiology;  Laterality: N/A;  . TONSILLECTOMY      Family History  Problem Relation Age of Onset  . CAD Father   . Heart attack Father 48  . Cancer Maternal Uncle   . Seizures Mother     Social History:  reports that she has never smoked. She has never used smokeless  tobacco. She reports previous alcohol use. She reports that she does not use drugs. Patient denies known exposures to radiation on toxins. She retired from [employer] in 11/2017. She lives in [place].  The patient is alone today.  Allergies: No Known Allergies  Current Medications: Current Outpatient Medications  Medication Sig Dispense Refill  . cyanocobalamin (,VITAMIN B-12,) 1000 MCG/ML injection Inject 1,000 mcg into the muscle every 30 (thirty) days.     . hydrOXYzine (ATARAX/VISTARIL) 10 MG tablet TAKE 1 TABLET (10 MG TOTAL) BY MOUTH 3 (THREE) TIMES DAILY AS NEEDED FOR ITCHING. 30 tablet 0  . lactulose (CHRONULAC) 10 GM/15ML solution TAKE 30 MLS (20 G TOTAL) BY MOUTH 2 (TWO) TIMES DAILY. 473 mL 0  . metFORMIN (GLUCOPHAGE) 500 MG tablet Take 1,000 mg by mouth 2 (two) times daily with a meal.     . omeprazole (PRILOSEC) 40 MG capsule omeprazole 40 mg capsule,delayed release  TAKE 1 CAPSULE BY MOUTH EVERY DAY    . ONE TOUCH ULTRA TEST test strip 2 (two) times daily.  5  . ONETOUCH DELICA LANCETS 37S MISC 2 (two) times daily.  5  . rifaximin (XIFAXAN) 550 MG TABS tablet TAKE 1 TABLET (550 MG TOTAL) BY MOUTH 2 (TWO) TIMES DAILY. 60 tablet 5  . spironolactone (ALDACTONE) 50 MG tablet Take 2 tablets (100 mg total) by mouth daily. [place]  tablet 5  . traMADol (ULTRAM) 50 MG tablet     . Vitamin D, Ergocalciferol, (DRISDOL) 1.25 MG (50000 UT) CAPS capsule TAKE 1 CAPSULE BY MOUTH ONE TIME PER WEEK 12 capsule 0  . cefdinir (OMNICEF) 300 MG capsule     . cephALEXin (KEFLEX) 500 MG capsule TAKE 1 CAPSULE BY MOUTH THREE TIMES A DAY    . doxycycline (VIBRAMYCIN) 100 MG capsule doxycycline hyclate 100 mg capsule  Take 1 capsule twice a day by oral route for 21 days.    . fluconazole (DIFLUCAN) 150 MG tablet Take 150 mg by mouth daily.    . furosemide (LASIX) 20 MG tablet Take 2 tablets (40 mg total) by mouth daily. 60 tablet 5  . mupirocin ointment (BACTROBAN) 2 %     . nystatin (MYCOSTATIN/NYSTOP)  powder Apply 1 g topically as needed (rash).     . ondansetron (ZOFRAN) 4 MG tablet Take 1 tablet (4 mg total) by mouth every 6 (six) hours as needed for nausea. (Patient not taking: Reported on 07/07/2019) 20 tablet 0  . sulfamethoxazole-trimethoprim (BACTRIM DS) 800-160 MG tablet sulfamethoxazole 800 mg-trimethoprim 160 mg tablet     No current facility-administered medications for this visit.     Review of Systems  Constitutional: Positive for weight loss (11 pounds since 05/20/2019). Negative for chills, diaphoresis, fever and malaise/fatigue.       Feels "ok".  HENT: Negative.  Negative for congestion, ear pain, nosebleeds, sinus pain and sore throat.   Eyes: Negative.  Negative for blurred vision, double vision, photophobia and pain.  Respiratory: Negative.  Negative for cough, hemoptysis, sputum production and shortness of breath.   Cardiovascular: Negative.  Negative for chest pain, palpitations, orthopnea, leg swelling and PND.  Gastrointestinal: Positive for diarrhea (lactulose causes loose stools). Negative for abdominal pain, blood in stool, constipation, heartburn, melena, nausea and vomiting.       Portal hypertensive gastropathy s/p variceal ligation.  Appetite affected when on antibiotics.  Genitourinary: Negative.  Negative for dysuria, frequency, hematuria and urgency.  Musculoskeletal: Positive for back pain. Negative for falls, joint pain, myalgias and neck pain.  Skin: Positive for itching (back at night ; lower abdominal itch gone). Negative for rash.  Neurological: Negative.  Negative for dizziness, tremors, sensory change, speech change, focal weakness, seizures, weakness and headaches.  Endo/Heme/Allergies: Does not bruise/bleed easily.       Diabetes on Metformin.  Psychiatric/Behavioral: Negative.  Negative for depression and memory loss. The patient is not nervous/anxious and does not have insomnia.   All other systems reviewed and are negative.  Performance status  (ECOG): 2  Vitals Blood pressure (!) 113/58, pulse 67, temperature (!) 96.7 F (35.9 C), temperature source Tympanic, resp. rate 18, height 5' 3" (1.6 m), weight 162 lb 11.2 oz (73.8 kg).   Physical Exam  Constitutional: She is oriented to person, place, and time. She appears well-developed and well-nourished. No distress.  HENT:  Head: Normocephalic and atraumatic.  Mouth/Throat: Oropharynx is clear and moist. No oropharyngeal exudate.  Shoulder length gray hair.  Mask.  Eyes: Pupils are equal, round, and reactive to light. Conjunctivae and EOM are normal. No scleral icterus.  Glasses. Blue eyes.  Neck: Normal range of motion. Neck supple. No JVD present.  Cardiovascular: Normal rate, regular rhythm and normal heart sounds.  No murmur heard. Pulmonary/Chest: Effort normal and breath sounds normal. No respiratory distress. She has no wheezes.  Abdominal: Soft. She exhibits no distension. There is no abdominal tenderness. There is  no rebound and no guarding.  Musculoskeletal: Normal range of motion.        General: No edema.     Comments: Right index finger wrapped.  No increased warmth or swelling of exposed finger.  Lymphadenopathy:    She has no cervical adenopathy.    She has no axillary adenopathy.       Right: No supraclavicular adenopathy present.       Left: No supraclavicular adenopathy present.  Neurological: She is alert and oriented to person, place, and time.  Skin: Skin is warm and dry. She is not diaphoretic. No erythema.  Telangectasias lip and face.  Spider angiomas.  Psychiatric: She has a normal mood and affect. Her behavior is normal. Judgment and thought content normal.  Nursing note and vitals reviewed.   No visits with results within 3 Day(s) from this visit.  Latest known visit with results is:  Orders Only on 06/12/2019  Component Date Value Ref Range Status  . Glucose, Bld 06/16/2019 122* 65 - 99 mg/dL Final   Comment: .            Fasting reference  interval . For someone without known diabetes, a glucose value between 100 and 125 mg/dL is consistent with prediabetes and should be confirmed with a follow-up test. .   . BUN 06/16/2019 14  7 - 25 mg/dL Final  . Creat 06/16/2019 1.18* 0.50 - 0.99 mg/dL Final   Comment: For patients >67 years of age, the reference limit for Creatinine is approximately 13% higher for people identified as African-American. .   . GFR, Est Non African American 06/16/2019 48* > OR = 60 mL/min/1.33m Final  . GFR, Est African American 06/16/2019 55* > OR = 60 mL/min/1.743mFinal  . BUN/Creatinine Ratio 06/16/2019 12  6 - 22 (calc) Final  . Sodium 06/16/2019 134* 135 - 146 mmol/L Final  . Potassium 06/16/2019 4.2  3.5 - 5.3 mmol/L Final  . Chloride 06/16/2019 101  98 - 110 mmol/L Final  . CO2 06/16/2019 19* 20 - 32 mmol/L Final  . Calcium 06/16/2019 10.0  8.6 - 10.4 mg/dL Final  . Total Protein 06/16/2019 6.6  6.1 - 8.1 g/dL Final  . Albumin 06/16/2019 3.3* 3.6 - 5.1 g/dL Final  . Globulin 06/16/2019 3.3  1.9 - 3.7 g/dL (calc) Final  . AG Ratio 06/16/2019 1.0  1.0 - 2.5 (calc) Final  . Total Bilirubin 06/16/2019 1.5* 0.2 - 1.2 mg/dL Final  . Alkaline phosphatase (APISO) 06/16/2019 173* 37 - 153 U/L Final  . AST 06/16/2019 60* 10 - 35 U/L Final  . ALT 06/16/2019 27  6 - 29 U/L Final  . INR 06/16/2019 1.1   Final   Comment: Reference Range                     0.9-1.1 Moderate-intensity Warfarin Therapy 2.0-3.0 Higher-intensity Warfarin Therapy   3.0-4.0  .   . Marland Kitchenrothrombin Time 06/16/2019 11.6* 9.0 - 11.5 sec Final   Comment: . For more information on this test, go to: http://education.[facility].com/faq/FAQ104 .     Assessment:  Crystal [name] is a [age].o. female with a right lower lobe pulmonary nodule. She denies any smoking history.  Abdomen and pelvic CTon 02/09/2018 revealed an 1.8 cm spiculated density in right lower lobe concerning for malignancy. There was mildly nodular hepatic  contourconcerning for hepatic cirrhosis. There was moderate splenomegalysuggesting portal venous hypertension. Mild ascites was noted. There were mildly enlarged retroperitoneal lymph nodes (  largest 1.1 cm) concerning for possible metastatic disease or malignancy. There was a 7 cm left ovarian cyst. Further evaluation with MRI was recommended to evaluate for possible neoplasm. CA125was 351.3 and CEA1.1 on 02/21/2018.   Chest CTon 02/25/2018 revealed a 2.3 x 1.5 x 1.0 cm perifissural nodulewith spiculated margins in the right middle lobe. There was a 1.5 x 1.5 x 1.4 cm right lower lobe spiculated nodule. There was a moderate left sided pleural effusion.   PET scanon 03/08/2018 revealed a 2.5 x 1.8 cm sub solid nodular opacity in theRML(SUV 2.7) and a 1.6 cm roundedslightly spiculated RLL pulmonary nodule(SUV 2.1). There were no enlarged or hypermetabolic mediastinal or hilar lymph nodes. There was a small left pleural effusion. 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  Chest CTon 05/15/2018 revealed no significant changes in the 2 previously noted nodules in the RIGHT lung. Irregular RML lesion measures 2.4 x 1.4 cm (previously 2.3 x 1.5 cm). Spiculated central RLL lesion measures 1.8 x 1.4 cm (previously 1.6 x 1.5 cm). Both areas remain concerning for neoplasm. There was interval decrease in ascites volume overall. Upper abdominal lymphadenopathy noted that was felt to be reactive due to underlying liver disease.   Chest CTon 08/15/2018 revealed the spiculated right lower lobe lung lesion appeared slightly increased and was not significantly changed in size compared with previous exam. Morphologically, this was worrisome for a small bronchogenic neoplasm. The right middle lobe lung nodule was slightly decreased in size in the interval and may be post infectious or inflammatory in etiology.   Super D chest CT on 01/24/2019 revealed a 3.2 x 2.3 cm ill-defined anterior right lung nodule compared to 2.9 x 2.3 cm previously. Minor fissure was not well, and as such, this nodule can not be confidently localized to the right upper or middle lobe.  The right lower lobe nodule measured 2.0 x 2.0 cm compared to 2.0 x 1.9 cm.  She was admitted to [facility] from 02/09/2018 - 04/02/2019with a variceal hemorrhage. EGDon 02/10/2018 revealed 5 columns of grade III esophageal varices in the lower third of the esophagus. There was stigmata of recent bleeding and red wale signs. She underwent variceal ligation x 10. She required 3 units of PRBCs.  EGDon 03/25/2018 revealed 2 angiectasias(non-bleeding) in the second portion of the duodenum, and a few diminutive (non-bleeding) angiectasias in the prepyloric region of the stomach that were treated APC. There were 4 non-bleeding ulcers (Forrest Class III) found in the gastric fundus. There were 4 columns of large non-bleeding varicesin the lower third of the esophagus, of which demonstrated no stigmata of bleeding. Varices were banded. EGD on 04/22/2018 revealed one non-bleeding cratered gastric ulcer(Forrest Class III) in the lesser curvature of the gastric body. Duodenal bulb and second portion of the duodenum were normal. Moderate portal hypertensive gastropathy noted in the stomach. Large varices in the lower third of the esophagus noted. 2 bands were placed with incomplete eradication of the lesions.   Colonoscopyon 03/25/2018 revealed a single 5 mm sessile polyp in the ascending colon. Patient has rectal varices and external hemorrhoids. Pathology returned as tubular adenoma, and was negative for high grade dysplasia and malignancy.   She has iron deficiency anemia. Ferritinwas 6. She is on ferrous sulfate 325 mg TID. She hasB12 deficiency. B12was 103. Intrinsic factor antibody was negative. Anti-parietal cell antibody was elevated  (45.6) and c/w pernicious anemia. She began B12 injections on 03/06/2018 (last 06/09/2019). Folate was 26 on  11/20/2018. B12 was 485 on 05/24/2018. Dietis good.  She received Venoferweekly x 4 (07/12/2018 - 07/31/2018).   Ferritinhas been followed: 6 on 02/09/2018, 16 on 05/24/2018, 13 on 07/10/2018, 374 on 08/15/2018, 485 on 08/21/2018, 114 on 09/25/2018, 63 on 11/20/2018, 35 on 02/12/2019, and 22 on 04/24/2019.  She has hepatitis C antibodies. HCV RNA was not detected. She has been exposed to hepatitis B. Anti-smooth muscle antibodiesare weakly positive. ANAwas positive (>1:1280 centromere antibody). Negative studiesincluded: ceruloplasmin, anti-mitochondrial antibodies, hepatitis B IgM, HIV, and alpha-1 antitrypsin. PT was 15.2 (INR 1.21). AFPwas 1.5 on 05/28/2018, 2.4 on 11/20/2018, and 2.0 on 04/24/2019.  She has a history of decompensated cirrhosiss/p TIPSprocedure 08/07/2018. She has portal hypertension and a history of recurrent bleeding esophageal varices.  She was given a 2-year life expectancy following her TIPS procedure.  Abdominal/pelvic arterial/venous ultrasound doppler on 04/24/2019 revealed a patent TIPS and no evidence of persisting ascites.  RUQ ultrasoundon 05/29/2018 revealed cholelithiasis. There was a thickened edematous gallbladder wall. The wall thickening could be due to hypoproteinemia/hypoalbuminemia or cholecystitis. Negative sonographic Murphy's sign. There was a heterogeneous slightly nodular liver consistent with cirrhosis.  Pelvic ultrasoundon 03/18/2018 demonstrated a simple cyst in the LEFT ovary measuring 6.1 x 4.5 x 6.6 cm. Repeat imaging recommended in 1 year to document stability.  Symptomatically, she is feeling better s/p completion of antibiotics for her right index finger.  Plan: 1.   Labs today:  CBC with diff, ferritin, iron studies. 2.   Decompensated cirrhosis s/p TIPS Clinically, she is doing fairly well.  Life  expectancy is limited s/p TIPS (2 years). She denies any bleeding. AFP was 2.0 on 04/24/2019. Encourage ongoing close follow-up with GI. 3.   Pulmonary nodules At last discussion, she was not interested in treatment for possible lung cancer.   She wishes to have ongoing monitoring.   Chest CT in 1 month. 4.   Iron deficiency anemia Hematocrit36.8. Hemoglobin 12.4. MCV 87.4 on 02/12/2019. Ferritinwas 35 on 02/12/2019. Patient wishes to have labs [facility] at [facility] today. [facility] slips provided for 3 months. 5.   B12 deficiency             B12 today and monthly x6. 6. RTC in 3 months (after [facility] labs drawn) for MD assessment, review of labs, and +/- Venofer.  I discussed the assessment and treatment plan with the patient.  The patient was provided an opportunity to ask questions and all were answered.  The patient agreed with the plan and demonstrated an understanding of the instructions.  The patient was advised to call back if the symptoms worsen or if the condition fails to improve as anticipated.   Lequita Asal, MD, PhD    07/07/2019, 10:39 AM

## 2019-07-07 ENCOUNTER — Inpatient Hospital Stay: Payer: Medicare HMO

## 2019-07-07 ENCOUNTER — Other Ambulatory Visit: Payer: Self-pay | Admitting: Hematology and Oncology

## 2019-07-07 ENCOUNTER — Encounter: Payer: Self-pay | Admitting: Hematology and Oncology

## 2019-07-07 ENCOUNTER — Other Ambulatory Visit: Payer: Self-pay

## 2019-07-07 ENCOUNTER — Telehealth: Payer: Self-pay | Admitting: *Deleted

## 2019-07-07 ENCOUNTER — Inpatient Hospital Stay: Payer: Medicare HMO | Attending: Hematology and Oncology | Admitting: Hematology and Oncology

## 2019-07-07 VITALS — BP 113/58 | HR 67 | Temp 96.7°F | Resp 18 | Ht 63.0 in | Wt 162.7 lb

## 2019-07-07 DIAGNOSIS — R918 Other nonspecific abnormal finding of lung field: Secondary | ICD-10-CM | POA: Diagnosis not present

## 2019-07-07 DIAGNOSIS — D500 Iron deficiency anemia secondary to blood loss (chronic): Secondary | ICD-10-CM | POA: Diagnosis not present

## 2019-07-07 DIAGNOSIS — E538 Deficiency of other specified B group vitamins: Secondary | ICD-10-CM | POA: Diagnosis not present

## 2019-07-07 MED ORDER — CYANOCOBALAMIN 1000 MCG/ML IJ SOLN
1000.0000 ug | Freq: Once | INTRAMUSCULAR | Status: AC
  Administered 2019-07-07: 1000 ug via INTRAMUSCULAR

## 2019-07-07 NOTE — Telephone Encounter (Signed)
  Please follow-up on this tomorrow.  M

## 2019-07-07 NOTE — Patient Instructions (Signed)

## 2019-07-07 NOTE — Progress Notes (Signed)
No new changes noted today

## 2019-07-07 NOTE — Telephone Encounter (Signed)
[facility] called stating they did not receive a lavender top tube to run the CBC so it will not be reported to you

## 2019-07-09 ENCOUNTER — Encounter: Payer: Self-pay | Admitting: Family Medicine

## 2019-07-14 DIAGNOSIS — R69 Illness, unspecified: Secondary | ICD-10-CM | POA: Diagnosis not present

## 2019-07-15 ENCOUNTER — Other Ambulatory Visit: Payer: Self-pay | Admitting: Gastroenterology

## 2019-07-15 ENCOUNTER — Encounter: Payer: Self-pay | Admitting: Hematology and Oncology

## 2019-07-15 DIAGNOSIS — I8510 Secondary esophageal varices without bleeding: Secondary | ICD-10-CM

## 2019-07-15 DIAGNOSIS — L299 Pruritus, unspecified: Secondary | ICD-10-CM

## 2019-07-15 DIAGNOSIS — Z95828 Presence of other vascular implants and grafts: Secondary | ICD-10-CM

## 2019-07-15 DIAGNOSIS — K7469 Other cirrhosis of liver: Secondary | ICD-10-CM

## 2019-07-15 DIAGNOSIS — D500 Iron deficiency anemia secondary to blood loss (chronic): Secondary | ICD-10-CM

## 2019-07-15 DIAGNOSIS — K7581 Nonalcoholic steatohepatitis (NASH): Secondary | ICD-10-CM

## 2019-07-15 DIAGNOSIS — I8511 Secondary esophageal varices with bleeding: Secondary | ICD-10-CM

## 2019-07-15 DIAGNOSIS — K7460 Unspecified cirrhosis of liver: Secondary | ICD-10-CM

## 2019-07-15 DIAGNOSIS — R188 Other ascites: Secondary | ICD-10-CM

## 2019-07-17 DIAGNOSIS — M79644 Pain in right finger(s): Secondary | ICD-10-CM | POA: Diagnosis not present

## 2019-07-18 DIAGNOSIS — R69 Illness, unspecified: Secondary | ICD-10-CM | POA: Diagnosis not present

## 2019-07-28 ENCOUNTER — Other Ambulatory Visit: Payer: Self-pay | Admitting: Gastroenterology

## 2019-07-29 ENCOUNTER — Other Ambulatory Visit: Payer: Self-pay | Admitting: Gastroenterology

## 2019-08-01 ENCOUNTER — Other Ambulatory Visit: Payer: Self-pay

## 2019-08-01 NOTE — Telephone Encounter (Signed)
Pt is requesting a refill.

## 2019-08-01 NOTE — Telephone Encounter (Signed)
Please refill rx for pt

## 2019-08-04 ENCOUNTER — Inpatient Hospital Stay: Payer: Medicare HMO | Attending: Hematology and Oncology

## 2019-08-04 ENCOUNTER — Other Ambulatory Visit: Payer: Self-pay

## 2019-08-04 VITALS — BP 123/66 | HR 78 | Temp 98.9°F | Resp 16

## 2019-08-04 DIAGNOSIS — E538 Deficiency of other specified B group vitamins: Secondary | ICD-10-CM

## 2019-08-04 MED ORDER — CYANOCOBALAMIN 1000 MCG/ML IJ SOLN
1000.0000 ug | Freq: Once | INTRAMUSCULAR | Status: AC
  Administered 2019-08-04: 1000 ug via INTRAMUSCULAR
  Filled 2019-08-04: qty 1

## 2019-08-04 NOTE — Patient Instructions (Signed)

## 2019-08-05 ENCOUNTER — Telehealth: Payer: Self-pay | Admitting: Gastroenterology

## 2019-08-05 NOTE — Telephone Encounter (Signed)
Pt left vm she is calling for Dr. Verlin Grills nurse she has been out of the [employer] 2 days and just went to the pharmacy and it I still not approved please call pt

## 2019-08-06 ENCOUNTER — Other Ambulatory Visit: Payer: Self-pay

## 2019-08-06 MED ORDER — LACTULOSE 10 GM/15ML PO SOLN: ORAL | 1 refills | Status: DC

## 2019-08-06 NOTE — Telephone Encounter (Signed)
Pt left vm she needs to speak to Dr. Verlin Grills nurse as soon as possible

## 2019-08-06 NOTE — Telephone Encounter (Signed)
Patient called & needs her medication (latolose) called into [facility]. She has none left & does not want to go to the hospital.States she needs this medication to live.

## 2019-08-06 NOTE — Telephone Encounter (Signed)
Lactulose has been sent to pt's pharmacy.

## 2019-08-07 ENCOUNTER — Ambulatory Visit
Admission: RE | Admit: 2019-08-07 | Discharge: 2019-08-07 | Disposition: A | Discharge status: Home / Self Care | Payer: Medicare HMO | Source: Ambulatory Visit | Attending: Hematology and Oncology | Admitting: Hematology and Oncology

## 2019-08-07 ENCOUNTER — Other Ambulatory Visit: Payer: Self-pay

## 2019-08-07 DIAGNOSIS — R918 Other nonspecific abnormal finding of lung field: Secondary | ICD-10-CM

## 2019-08-11 ENCOUNTER — Other Ambulatory Visit: Payer: Self-pay | Admitting: Gastroenterology

## 2019-08-11 NOTE — Telephone Encounter (Signed)
Last office visit 06/26/2019 Decompensated Hepatic cirrhosis  Last refill Lasix 89m 05/20/2019 5 refills  Vitamin D 08/01/2019 0 refills  Has appointment 10/07/2019

## 2019-08-18 DIAGNOSIS — Z4789 Encounter for other orthopedic aftercare: Secondary | ICD-10-CM | POA: Diagnosis not present

## 2019-08-18 DIAGNOSIS — M86141 Other acute osteomyelitis, right hand: Secondary | ICD-10-CM | POA: Diagnosis not present

## 2019-08-20 ENCOUNTER — Observation Stay: Payer: Medicare HMO

## 2019-08-20 ENCOUNTER — Telehealth: Payer: Self-pay | Admitting: Gastroenterology

## 2019-08-20 ENCOUNTER — Inpatient Hospital Stay
Admission: EM | Admit: 2019-08-20 | Discharge: 2019-08-22 | DRG: 071 | Disposition: A | Discharge status: Home / Self Care | Payer: Medicare HMO | Attending: Internal Medicine | Admitting: Internal Medicine

## 2019-08-20 ENCOUNTER — Other Ambulatory Visit: Payer: Self-pay

## 2019-08-20 ENCOUNTER — Emergency Department: Payer: Medicare HMO

## 2019-08-20 ENCOUNTER — Encounter: Payer: Self-pay | Admitting: Emergency Medicine

## 2019-08-20 DIAGNOSIS — Z7984 Long term (current) use of oral hypoglycemic drugs: Secondary | ICD-10-CM

## 2019-08-20 DIAGNOSIS — E538 Deficiency of other specified B group vitamins: Secondary | ICD-10-CM | POA: Diagnosis present

## 2019-08-20 DIAGNOSIS — E876 Hypokalemia: Secondary | ICD-10-CM | POA: Diagnosis present

## 2019-08-20 DIAGNOSIS — Z8249 Family history of ischemic heart disease and other diseases of the circulatory system: Secondary | ICD-10-CM

## 2019-08-20 DIAGNOSIS — Z8711 Personal history of peptic ulcer disease: Secondary | ICD-10-CM

## 2019-08-20 DIAGNOSIS — R4182 Altered mental status, unspecified: Secondary | ICD-10-CM | POA: Diagnosis present

## 2019-08-20 DIAGNOSIS — L739 Follicular disorder, unspecified: Secondary | ICD-10-CM | POA: Diagnosis present

## 2019-08-20 DIAGNOSIS — E119 Type 2 diabetes mellitus without complications: Secondary | ICD-10-CM | POA: Diagnosis present

## 2019-08-20 DIAGNOSIS — R188 Other ascites: Secondary | ICD-10-CM

## 2019-08-20 DIAGNOSIS — E860 Dehydration: Secondary | ICD-10-CM | POA: Diagnosis present

## 2019-08-20 DIAGNOSIS — K7030 Alcoholic cirrhosis of liver without ascites: Secondary | ICD-10-CM | POA: Diagnosis present

## 2019-08-20 DIAGNOSIS — R2681 Unsteadiness on feet: Secondary | ICD-10-CM | POA: Diagnosis not present

## 2019-08-20 DIAGNOSIS — Z809 Family history of malignant neoplasm, unspecified: Secondary | ICD-10-CM

## 2019-08-20 DIAGNOSIS — G9341 Metabolic encephalopathy: Principal | ICD-10-CM | POA: Diagnosis present

## 2019-08-20 DIAGNOSIS — I7300 Raynaud's syndrome without gangrene: Secondary | ICD-10-CM | POA: Diagnosis present

## 2019-08-20 DIAGNOSIS — F413 Other mixed anxiety disorders: Secondary | ICD-10-CM | POA: Diagnosis not present

## 2019-08-20 DIAGNOSIS — K7460 Unspecified cirrhosis of liver: Secondary | ICD-10-CM | POA: Diagnosis not present

## 2019-08-20 DIAGNOSIS — Z9689 Presence of other specified functional implants: Secondary | ICD-10-CM | POA: Diagnosis not present

## 2019-08-20 DIAGNOSIS — Z20828 Contact with and (suspected) exposure to other viral communicable diseases: Secondary | ICD-10-CM | POA: Diagnosis present

## 2019-08-20 DIAGNOSIS — Z03818 Encounter for observation for suspected exposure to other biological agents ruled out: Secondary | ICD-10-CM | POA: Diagnosis not present

## 2019-08-20 DIAGNOSIS — K766 Portal hypertension: Secondary | ICD-10-CM | POA: Diagnosis present

## 2019-08-20 DIAGNOSIS — Z23 Encounter for immunization: Secondary | ICD-10-CM | POA: Diagnosis not present

## 2019-08-20 LAB — CBC
HCT: 40.6 % (ref 36.0–46.0)
Hemoglobin: 12.9 g/dL (ref 12.0–15.0)
MCH: 28.1 pg (ref 26.0–34.0)
MCHC: 31.8 g/dL (ref 30.0–36.0)
MCV: 88.5 fL (ref 80.0–100.0)
Platelets: 118 10*3/uL — ABNORMAL LOW (ref 150–400)
RBC: 4.59 MIL/uL (ref 3.87–5.11)
RDW: 16.1 % — ABNORMAL HIGH (ref 11.5–15.5)
WBC: 4.3 10*3/uL (ref 4.0–10.5)
nRBC: 0 % (ref 0.0–0.2)

## 2019-08-20 LAB — DIFFERENTIAL
Abs Immature Granulocytes: 0.01 10*3/uL (ref 0.00–0.07)
Basophils Absolute: 0 10*3/uL (ref 0.0–0.1)
Basophils Relative: 1 %
Eosinophils Absolute: 0 10*3/uL (ref 0.0–0.5)
Eosinophils Relative: 1 %
Immature Granulocytes: 0 %
Lymphocytes Relative: 25 %
Lymphs Abs: 1.1 10*3/uL (ref 0.7–4.0)
Monocytes Absolute: 0.4 10*3/uL (ref 0.1–1.0)
Monocytes Relative: 9 %
Neutro Abs: 2.8 10*3/uL (ref 1.7–7.7)
Neutrophils Relative %: 64 %

## 2019-08-20 LAB — URINALYSIS, ROUTINE W REFLEX MICROSCOPIC
Bilirubin Urine: NEGATIVE
Glucose, UA: NEGATIVE mg/dL
Hgb urine dipstick: NEGATIVE
Ketones, ur: NEGATIVE mg/dL
Nitrite: NEGATIVE
Protein, ur: NEGATIVE mg/dL
Specific Gravity, Urine: 1.012 (ref 1.005–1.030)
pH: 5 (ref 5.0–8.0)

## 2019-08-20 LAB — COMPREHENSIVE METABOLIC PANEL
ALT: 24 U/L (ref 0–44)
AST: 60 U/L — ABNORMAL HIGH (ref 15–41)
Albumin: 3.2 g/dL — ABNORMAL LOW (ref 3.5–5.0)
Alkaline Phosphatase: 134 U/L — ABNORMAL HIGH (ref 38–126)
Anion gap: 18 — ABNORMAL HIGH (ref 5–15)
BUN: 17 mg/dL (ref 8–23)
CO2: 16 mmol/L — ABNORMAL LOW (ref 22–32)
Calcium: 9.9 mg/dL (ref 8.9–10.3)
Chloride: 102 mmol/L (ref 98–111)
Creatinine, Ser: 1.35 mg/dL — ABNORMAL HIGH (ref 0.44–1.00)
GFR calc Af Amer: 47 mL/min — ABNORMAL LOW (ref >60)
GFR calc non Af Amer: 41 mL/min — ABNORMAL LOW (ref >60)
Glucose, Bld: 166 mg/dL — ABNORMAL HIGH (ref 70–99)
Potassium: 3.8 mmol/L (ref 3.5–5.1)
Sodium: 136 mmol/L (ref 135–145)
Total Bilirubin: 2.3 mg/dL — ABNORMAL HIGH (ref 0.3–1.2)
Total Protein: 7 g/dL (ref 6.5–8.1)

## 2019-08-20 LAB — GLUCOSE, CAPILLARY
Glucose-Capillary: 58 mg/dL — ABNORMAL LOW (ref 70–99)
Glucose-Capillary: 86 mg/dL (ref 70–99)

## 2019-08-20 LAB — AMMONIA: Ammonia: 30 umol/L (ref 9–35)

## 2019-08-20 MED ORDER — HEPARIN SODIUM (PORCINE) 5000 UNIT/ML IJ SOLN
5000.0000 [IU] | Freq: Three times a day (TID) | INTRAMUSCULAR | Status: DC
  Administered 2019-08-20 – 2019-08-22 (×5): 5000 [IU] via SUBCUTANEOUS
  Filled 2019-08-20 (×5): qty 1

## 2019-08-20 MED ORDER — SPIRONOLACTONE 25 MG PO TABS
50.0000 mg | ORAL_TABLET | Freq: Every day | ORAL | Status: DC
  Filled 2019-08-20: qty 2

## 2019-08-20 MED ORDER — PANTOPRAZOLE SODIUM 40 MG PO TBEC
40.0000 mg | DELAYED_RELEASE_TABLET | Freq: Every day | ORAL | Status: DC
  Administered 2019-08-21 – 2019-08-22 (×2): 40 mg via ORAL
  Filled 2019-08-20 (×2): qty 1

## 2019-08-20 MED ORDER — ONDANSETRON HCL 4 MG PO TABS: 4.0000 mg | ORAL_TABLET | Freq: Four times a day (QID) | ORAL | Status: DC | PRN

## 2019-08-20 MED ORDER — INSULIN ASPART 100 UNIT/ML ~~LOC~~ SOLN: 0.0000 [IU] | Freq: Every day | SUBCUTANEOUS | Status: DC

## 2019-08-20 MED ORDER — FUROSEMIDE 40 MG PO TABS
40.0000 mg | ORAL_TABLET | Freq: Every day | ORAL | Status: DC
  Filled 2019-08-20: qty 1

## 2019-08-20 MED ORDER — MUPIROCIN 2 % EX OINT
TOPICAL_OINTMENT | Freq: Every day | CUTANEOUS | Status: DC
  Administered 2019-08-22: 08:00:00 via TOPICAL
  Filled 2019-08-20: qty 22

## 2019-08-20 MED ORDER — ONDANSETRON HCL 4 MG/2ML IJ SOLN: 4.0000 mg | Freq: Four times a day (QID) | INTRAMUSCULAR | Status: DC | PRN

## 2019-08-20 MED ORDER — BISACODYL 5 MG PO TBEC: 5.0000 mg | DELAYED_RELEASE_TABLET | Freq: Every day | ORAL | Status: DC | PRN

## 2019-08-20 MED ORDER — SODIUM CHLORIDE 0.9 % IV BOLUS
1000.0000 mL | Freq: Once | INTRAVENOUS | Status: AC
  Administered 2019-08-20: 1000 mL via INTRAVENOUS

## 2019-08-20 MED ORDER — CYANOCOBALAMIN 1000 MCG/ML IJ SOLN: 1000.0000 ug | INTRAMUSCULAR | Status: DC

## 2019-08-20 MED ORDER — INSULIN ASPART 100 UNIT/ML ~~LOC~~ SOLN
0.0000 [IU] | Freq: Three times a day (TID) | SUBCUTANEOUS | Status: DC
  Administered 2019-08-21 – 2019-08-22 (×2): 1 [IU] via SUBCUTANEOUS
  Filled 2019-08-20 (×2): qty 1

## 2019-08-20 MED ORDER — RIFAXIMIN 550 MG PO TABS
550.0000 mg | ORAL_TABLET | Freq: Two times a day (BID) | ORAL | Status: DC
  Administered 2019-08-20 – 2019-08-22 (×4): 550 mg via ORAL
  Filled 2019-08-20 (×5): qty 1

## 2019-08-20 NOTE — ED Notes (Signed)
ED TO INPATIENT HANDOFF REPORT  ED Nurse Name and Phone #: gracie  S Name/Age/Gender Crystal [name] 68 y.o. female Room/Bed: ED05A/ED05A  Code Status   Code Status: Full Code  Home/SNF/Other Home Patient oriented to: self, place, time and situation Is this baseline? Yes   Triage Complete: Triage complete  Chief Complaint memory loss nauseated  Triage Note Patient and husband report worsening confusion and memory loss x3 days. States she has been slow to preform daily tasks. Patient states she had TIPS procedure done last year and was told to keep an eye out for memory loss. Patient states PCP informed her to come be evaluated for possible UTI or stroke. Patient's friend states she has had trouble with balance as well. Also reports N/V.   Allergies No Known Allergies  Level of Care/Admitting Diagnosis ED Disposition    ED Disposition Condition [facility] Area: [place] [100120]  Level of Care: Med-Surg [16]  Covid Evaluation: Asymptomatic Screening Protocol (No Symptoms)  Diagnosis: AMS (altered mental status) [[id number]]  Admitting Physician: Epifanio Lesches [[id number]]  Attending Physician: Epifanio Lesches [phone]  PT Class (Do Not Modify): Observation [104]  PT Acc Code (Do Not Modify): Observation [10022]       B Medical/Surgery History Past Medical History:  Diagnosis Date  . Acute GI bleeding 02/09/2018  . Acute upper gastrointestinal bleeding 08/03/2018  . Anemia    TAKES IRON TAB  . Arthritis    SHOULDER  . B12 deficiency 02/18/2018  . Bleeding    GI  3/19  . Bronchitis    HX OF  . Cirrhosis ([place])   . Colon cancer screening   . Diabetes mellitus without complication ([place])    TYPE 2  . Fatty (change of) liver, not elsewhere classified   . Full dentures    UPPER AND LOWER  . Iron deficiency anemia due to chronic blood loss 02/18/2018  . Lung nodule, multiple   . Pernicious anemia 02/22/2018  . PUD (peptic  ulcer disease)   . Raynaud's syndrome   . Upper GI bleeding 08/03/2018   Past Surgical History:  Procedure Laterality Date  . CATARACT EXTRACTION W/PHACO Right 02/06/2018   Procedure: CATARACT EXTRACTION PHACO AND INTRAOCULAR LENS PLACEMENT ([place]) RIGHT DIABETIC;  Surgeon: Leandrew Koyanagi, MD;  Location: [place];  Service: Ophthalmology;  Laterality: Right;  DIABETIC, ORAL MED  . CATARACT EXTRACTION W/PHACO Left 04/30/2018   Procedure: CATARACT EXTRACTION PHACO AND INTRAOCULAR LENS PLACEMENT (I[facility]);  Surgeon: Leandrew Koyanagi, MD;  Location: [facility];  Service: Ophthalmology;  Laterality: Left;  [place] 01:04 AP% 21.3 CDE 13.66 Fluid pack lot # [id number]  . COLONOSCOPY WITH PROPOFOL N/A 03/25/2018   Procedure: COLONOSCOPY WITH PROPOFOL;  Surgeon: Lin Landsman, MD;  Location: [facility] ENDOSCOPY;  Service: Gastroenterology;  Laterality: N/A;  . DENTAL SURGERY     EXTRACTIONS  . ELECTROMAGNETIC NAVIGATION BROCHOSCOPY Right 01/27/2019   Procedure: ELECTROMAGNETIC NAVIGATION BRONCHOSCOPY;  Surgeon: [name], MD;  Location: [facility];  Service: Cardiopulmonary;  Laterality: Right;  . ESOPHAGOGASTRODUODENOSCOPY N/A 08/03/2018   Procedure: ESOPHAGOGASTRODUODENOSCOPY (EGD);  Surgeon: Lin Landsman, MD;  Location: [facility] ENDOSCOPY;  Service: Gastroenterology;  Laterality: N/A;  . ESOPHAGOGASTRODUODENOSCOPY (EGD) WITH PROPOFOL N/A 02/10/2018   Procedure: ESOPHAGOGASTRODUODENOSCOPY (EGD) WITH PROPOFOL;  Surgeon: Jonathon Bellows, MD;  Location: [facility] ENDOSCOPY;  Service: Gastroenterology;  Laterality: N/A;  . ESOPHAGOGASTRODUODENOSCOPY (EGD) WITH PROPOFOL N/A 03/25/2018   Procedure: ESOPHAGOGASTRODUODENOSCOPY (EGD) WITH PROPOFOL;  Surgeon: Lin Landsman, MD;  Location: [facility] ENDOSCOPY;  Service: Gastroenterology;  Laterality: N/A;  . ESOPHAGOGASTRODUODENOSCOPY (EGD) WITH PROPOFOL N/A 04/22/2018   Procedure: ESOPHAGOGASTRODUODENOSCOPY (EGD) WITH PROPOFOL with band ligation;  Surgeon: Lin Landsman, MD;  Location: [place];  Service: Gastroenterology;  Laterality: N/A;  . ESOPHAGOGASTRODUODENOSCOPY (EGD) WITH PROPOFOL N/A 06/24/2018   Procedure: ESOPHAGOGASTRODUODENOSCOPY (EGD) WITH PROPOFOL;  Surgeon: Lin Landsman, MD;  Location: [facility] ENDOSCOPY;  Service: Gastroenterology;  Laterality: N/A;  . ESOPHAGOGASTRODUODENOSCOPY (EGD) WITH PROPOFOL N/A 11/19/2018   Procedure: ESOPHAGOGASTRODUODENOSCOPY (EGD) WITH PROPOFOL;  Surgeon: Lin Landsman, MD;  Location: [facility] ENDOSCOPY;  Service: Gastroenterology;  Laterality: N/A;  . EYE SURGERY    . HEMORRHOID BANDING  03/25/2018   Procedure: HEMORRHOID BANDING;  Surgeon: Lin Landsman, MD;  Location: [facility] ENDOSCOPY;  Service: Gastroenterology;;  . IR EMBO ART  VEN HEMORR LYMPH EXTRAV  INC GUIDE ROADMAPPING  08/07/2018  . IR RADIOLOGIST EVAL & MGMT  09/03/2018  . IR RADIOLOGIST EVAL & MGMT  12/17/2018  . IR RADIOLOGIST EVAL & MGMT  06/24/2019  . IR TIPS  08/07/2018  . RADIOLOGY WITH ANESTHESIA N/A 08/07/2018   Procedure: TIPS;  Surgeon: Corrie Mckusick, DO;  Location: [place];  Service: Anesthesiology;  Laterality: N/A;  . TONSILLECTOMY       A IV Location/Drains/Wounds Patient Lines/Drains/Airways Status   Active Line/Drains/Airways    Name:   Placement date:   Placement time:   Site:   Days:   Peripheral IV 08/20/19 Left Antecubital   08/20/19    1550    Antecubital   less than 1          Intake/Output Last 24 hours No intake or output data in the 24 hours ending 08/20/19 2001  Labs/Imaging Results for orders placed or performed during the hospital encounter of 08/20/19 (from the past 48 hour(s))  CBC     Status: Abnormal   Collection Time: 08/20/19  3:33 PM  Result Value Ref Range   WBC 4.3 4.0 - 10.5 K/uL   RBC 4.59 3.87 - 5.11 MIL/uL   Hemoglobin 12.9 12.0 - 15.0 g/dL   HCT 40.6 36.0 - 46.0 %   MCV 88.5 80.0 - 100.0 fL   MCH 28.1 26.0 - 34.0 pg   MCHC 31.8 30.0 - 36.0 g/dL   RDW 16.1 (H) 11.5 - 15.5 %    Platelets 118 (L) 150 - 400 K/uL    Comment: Immature Platelet Fraction may be clinically indicated, consider ordering this additional test [id number]    nRBC 0.0 0.0 - 0.2 %    Comment: Performed at [facility], [address] [place], [place] [address]  Differential     Status: None   Collection Time: 08/20/19  3:33 PM  Result Value Ref Range   Neutrophils Relative % 64 %   Neutro Abs 2.8 1.7 - 7.7 K/uL   Lymphocytes Relative 25 %   Lymphs Abs 1.1 0.7 - 4.0 K/uL   Monocytes Relative 9 %   Monocytes Absolute 0.4 0.1 - 1.0 K/uL   Eosinophils Relative 1 %   Eosinophils Absolute 0.0 0.0 - 0.5 K/uL   Basophils Relative 1 %   Basophils Absolute 0.0 0.0 - 0.1 K/uL   Immature Granulocytes 0 %   Abs Immature Granulocytes 0.01 0.00 - 0.07 K/uL    Comment: Performed at [facility], [address], [place], [place] [address]  Comprehensive metabolic panel     Status: Abnormal   Collection Time: 08/20/19  3:33 PM  Result Value Ref Range   Sodium 136 135 - 145 mmol/L   Potassium 3.8 3.5 - 5.1 mmol/L   Chloride 102 98 - 111 mmol/L   CO2 16 (L) 22 - 32 mmol/L   Glucose, Bld 166 (H) 70 - 99 mg/dL   BUN 17 8 - 23 mg/dL   Creatinine, Ser 1.35 (H) 0.44 - 1.00 mg/dL   Calcium 9.9 8.9 - 10.3 mg/dL   Total Protein 7.0 6.5 - 8.1 g/dL   Albumin 3.2 (L) 3.5 - 5.0 g/dL   AST 60 (H) 15 - 41 U/L   ALT 24 0 - 44 U/L   Alkaline Phosphatase 134 (H) 38 - 126 U/L   Total Bilirubin 2.3 (H) 0.3 - 1.2 mg/dL   GFR calc non Af Amer 41 (L) >60 mL/min   GFR calc Af Amer 47 (L) >60 mL/min   Anion gap 18 (H) 5 - 15    Comment: Performed at [facility], [address] [place], [place] [address]  Urinalysis, Routine w reflex microscopic     Status: Abnormal   Collection Time: 08/20/19  3:33 PM  Result Value Ref Range   Color, Urine YELLOW (A) YELLOW   APPearance HAZY (A) CLEAR   Specific Gravity, Urine 1.012 1.005 - 1.030   pH 5.0 5.0 - 8.0   Glucose, UA NEGATIVE NEGATIVE  mg/dL   Hgb urine dipstick NEGATIVE NEGATIVE   Bilirubin Urine NEGATIVE NEGATIVE   Ketones, ur NEGATIVE NEGATIVE mg/dL   Protein, ur NEGATIVE NEGATIVE mg/dL   Nitrite NEGATIVE NEGATIVE   Leukocytes,Ua TRACE (A) NEGATIVE   RBC / HPF 0-5 0 - 5 RBC/hpf   WBC, UA 0-5 0 - 5 WBC/hpf   Bacteria, UA RARE (A) NONE SEEN   Squamous Epithelial / LPF 6-10 0 - 5   Mucus PRESENT    Hyaline Casts, UA PRESENT     Comment: Performed at [facility], [address] [place], [place] [address]  Ammonia     Status: None   Collection Time: 08/20/19  3:50 PM  Result Value Ref Range   Ammonia 30 9 - 35 umol/L    Comment: Performed at [facility], [address] [place], [place] [address]   Ct Head Wo Contrast  Result Date: 08/20/2019 CLINICAL DATA:  Liver stent placement.  Feeling funny. EXAM: CT HEAD WITHOUT CONTRAST TECHNIQUE: Contiguous axial images were obtained from the base of the skull through the vertex without intravenous contrast. COMPARISON:  None. FINDINGS: Brain: No evidence of acute infarction, hemorrhage, hydrocephalus, extra-axial collection or mass lesion/mass effect. Vascular: Intracranial atherosclerotic disease. Skull: No osseous abnormality. Sinuses/Orbits: Visualized paranasal sinuses are clear. Visualized mastoid sinuses are clear. Visualized orbits demonstrate no focal abnormality. Other: None IMPRESSION: No acute intracranial pathology. Electronically Signed   By: Kathreen Devoid   On: 08/20/2019 16:17   Mr Brain Wo Contrast  Result Date: 08/20/2019 CLINICAL DATA:  Altered mental status (AMS), unclear cause. 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 Memory loss ongoing for 3 days. Patient reports feeling very unstable when walking. EXAM: MRI HEAD WITHOUT CONTRAST TECHNIQUE: Multiplanar, multiecho pulse sequences of the brain and surrounding structures were obtained without intravenous  contrast. COMPARISON:  Head CT performed earlier the same day 08/20/2019. FINDINGS: Brain: There is no convincing evidence of acute infarct. No evidence of intracranial mass. No midline shift or extra-axial fluid collection. No chronic intracranial blood products. There is symmetric FLAIR  hyperintensity of the bilateral mamillary bodies. Minimal scattered and ill-defined T2/FLAIR hyperintensity within the cerebral white matter is nonspecific, but consistent with chronic small vessel ischemic disease. Cerebral volume is normal for age. Vascular: Flow voids maintained within the proximal large arterial vessels. Skull and upper cervical spine: No focal marrow lesion. Sinuses/Orbits: Visualized orbits demonstrate no acute abnormality. Susceptibility artifact limits evaluation of the maxillary sinuses. Mild paranasal sinus mucosal thickening. No significant mastoid effusion. IMPRESSION: 1. Symmetric FLAIR hyperintensity within the bilateral mamillary bodies. This finding may be seen in the setting of Wernicke encephalopathy, and clinical correlation is recommended. 2. No evidence of acute infarct. 3. Mild chronic small vessel ischemic disease. Electronically Signed   By: Kellie Simmering   On: 08/20/2019 19:55    Pending Labs Unresulted Labs (From admission, onward)    Start     Ordered   08/21/19 0347  Basic metabolic panel  Tomorrow morning,   STAT     08/20/19 1816   08/21/19 0500  CBC  Tomorrow morning,   STAT     08/20/19 1816   08/20/19 1914  Hemoglobin A1c  Once,   STAT    Comments: To assess prior glycemic control    08/20/19 1913   08/20/19 1812  CBC  (heparin)  Once,   STAT    Comments: Baseline for heparin therapy IF NOT ALREADY DRAWN.  Notify MD if PLT < 100 K.    08/20/19 1816   08/20/19 1812  Creatinine, serum  (heparin)  Once,   STAT    Comments: Baseline for heparin therapy IF NOT ALREADY DRAWN.    08/20/19 1816   08/20/19 1812  HIV Antibody (routine testing w rflx)  (HIV Antibody  (Routine testing w reflex) panel)  Once,   STAT     08/20/19 1816   08/20/19 1812  HIV4GL Save Tube  (HIV Antibody (Routine testing w reflex) panel)  Once,   STAT     08/20/19 1816   08/20/19 1803  SARS CORONAVIRUS 2 (TAT 6-24 HRS) Nasopharyngeal Nasopharyngeal Swab  (Asymptomatic/Tier 2 Patients Labs)  Once,   STAT    Question Answer Comment  Is this test for diagnosis or screening Screening   Symptomatic for COVID-19 as defined by CDC No   Hospitalized for COVID-19 No   Admitted to ICU for COVID-19 No   Previously tested for COVID-19 No   Resident in a congregate (group) care setting No   Employed in healthcare setting No   Pregnant No      08/20/19 1802          Vitals/Pain Today's Vitals   08/20/19 1710 08/20/19 1830 08/20/19 1945 08/20/19 2000  BP:  118/65    Pulse:  83 75 75  Resp:  18    Temp:      TempSrc:      SpO2:  94% 100% 100%  Weight:      Height:      PainSc: 0-No pain       Isolation Precautions No active isolations  Medications Medications  rifaximin (XIFAXAN) tablet 550 mg (has no administration in time range)  spironolactone (ALDACTONE) tablet 50 mg (has no administration in time range)  furosemide (LASIX) tablet 40 mg (has no administration in time range)  cyanocobalamin ((VITAMIN B-12)) injection 1,000 mcg (has no administration in time range)  pantoprazole (PROTONIX) EC tablet 40 mg (has no administration in time range)  mupirocin ointment (BACTROBAN) 2 % (has no administration in time range)  heparin  injection 5,000 Units (has no administration in time range)  bisacodyl (DULCOLAX) EC tablet 5 mg (has no administration in time range)  ondansetron (ZOFRAN) tablet 4 mg (has no administration in time range)    Or  ondansetron (ZOFRAN) injection 4 mg (has no administration in time range)  insulin aspart (novoLOG) injection 0-5 Units (has no administration in time range)  insulin aspart (novoLOG) injection 0-9 Units (has no administration in time  range)  sodium chloride 0.9 % bolus 1,000 mL (1,000 mLs Intravenous New Bag/Given 08/20/19 1817)    Mobility walks High fall risk   Focused Assessments Neuro Assessment Handoff:  Swallow screen pass? n/a   NIH Stroke Scale ( + Modified Stroke Scale Criteria)  Interval: Initial Level of Consciousness (1a.)   : Alert, keenly responsive LOC Questions (1b. )   +: Answers neither question correctly LOC Commands (1c. )   + : Performs both tasks correctly Best Gaze (2. )  +: Normal Visual (3. )  +: No visual loss Facial Palsy (4. )    : Normal symmetrical movements Motor Arm, Left (5a. )   +: Drift Motor Arm, Right (5b. )   +: No drift Motor Leg, Left (6a. )   +: Drift Motor Leg, Right (6b. )   +: Drift Limb Ataxia (7. ): Absent Sensory (8. )   +: Normal, no sensory loss Best Language (9. )   +: No aphasia Dysarthria (10. ): Normal Extinction/Inattention (11.)   +: No Abnormality Modified SS Total  +: 5 Complete NIHSS TOTAL: 5     Neuro Assessment:   Neuro Checks:   Initial (08/20/19 1655)  Last Documented NIHSS Modified Score: 5 (08/20/19 1655) Has TPA been given? No If patient is a Neuro Trauma and patient is going to OR before floor call report to [place] nurse: [phone] or [phone]     R Recommendations: See Admitting Provider Note  Report given to:   Additional Notes: confusion/weakness

## 2019-08-20 NOTE — ED Notes (Signed)
Pt given water at this time

## 2019-08-20 NOTE — Progress Notes (Signed)
Patient's blood sugar was 58. Gave patient 2 chocolate ice creams. Rechecked in an hour and it increased to 86. Encouraged patient to drink ginger ale and sprite.  Will continue to monitor blood sugar.  Christene Slates 08/20/2019  11:21 PM

## 2019-08-20 NOTE — ED Notes (Signed)
Dr. Kerman Passey in room to assess patient.  Will continue to monitor.

## 2019-08-20 NOTE — Telephone Encounter (Signed)
Talk to patient and patient states that the Liver stent was placed 1.5 years at [place] she had a small amount of confusion right after the procedure but it got better after that. Patient states the last 3 days she has had confusion, feels like she can not carry a conversation,balance issues. Denies any numbness, tingling, face dropping, SOB, or chest pain. Informed patient to call PCP and to let them know what is going on and I would forward this message to them also. Patient states she will call PCP office now.   Can not forward to PCP because PCP is not in epic. Forward to [employer] and called PCP office  To inform them what was going on

## 2019-08-20 NOTE — ED Notes (Signed)
Ambulated with pt to toilet.

## 2019-08-20 NOTE — ED Notes (Signed)
Patient transported to MRI

## 2019-08-20 NOTE — ED Provider Notes (Signed)
[facility] Emergency Department Provider Note  Time seen: 4:50 PM  I have reviewed the triage vital signs and the nursing notes.   HISTORY  Chief Complaint Memory Loss   HPI Zanovia A Kirtz is a 68 y.o. female with a past medical history of cirrhosis, diabetes, raynauds, anemia, presents to the emergency department for confusion.  According to the patient over the past 3 days she has been feeling confused with memory impairment, feels unsteady when walking.  Patient had a TIPS procedure performed 1 year ago, follows up with Dr. Marius Ditch of GI medicine.  Denies any recent fever cough or shortness of breath.  Denies chest pain abdominal pain or dysuria.  Does state nausea with one episode of vomiting yesterday.  States diarrhea which is chronic due to lactulose.  Past Medical History:  Diagnosis Date  . Acute GI bleeding 02/09/2018  . Acute upper gastrointestinal bleeding 08/03/2018  . Anemia    TAKES IRON TAB  . Arthritis    SHOULDER  . B12 deficiency 02/18/2018  . Bleeding    GI  3/19  . Bronchitis    HX OF  . Cirrhosis ([place])   . Colon cancer screening   . Diabetes mellitus without complication ([place])    TYPE 2  . Fatty (change of) liver, not elsewhere classified   . Full dentures    UPPER AND LOWER  . Iron deficiency anemia due to chronic blood loss 02/18/2018  . Lung nodule, multiple   . Pernicious anemia 02/22/2018  . PUD (peptic ulcer disease)   . Raynaud's syndrome   . Upper GI bleeding 08/03/2018    Patient Active Problem List   Diagnosis Date Noted  . Type 2 diabetes mellitus without complication, without long-term current use of insulin ([place]) 04/02/2019  . Aortic atherosclerosis ([place]) 04/02/2019  . Coronary artery calcification 04/02/2019  . Chronic heart failure with preserved ejection fraction ([place]) 04/02/2019  . Lung nodules   . Volume overload 12/11/2018  . GAVE (gastric antral vascular ectasia)   . S/P TIPS (transjugular intrahepatic  portosystemic shunt) 08/27/2018  . Diabetes ([place]) 08/03/2018  . Hx of esophageal varices   . Cirrhosis of liver with ascites ([place])   . AVM (arteriovenous malformation) of small bowel, acquired   . Adnexal cyst 03/16/2018  . Goals of care, counseling/discussion 03/16/2018  . Right middle lobe pulmonary nodule 03/14/2018  . Right lower lobe pulmonary nodule 02/18/2018  . Iron deficiency anemia due to chronic blood loss 02/18/2018  . B12 deficiency 02/18/2018  . 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 Hepatitis C virus infection without hepatic coma 02/18/2018    Past Surgical History:  Procedure Laterality Date  . CATARACT EXTRACTION W/PHACO Right 02/06/2018   Procedure: CATARACT EXTRACTION PHACO AND INTRAOCULAR LENS PLACEMENT ([place]) RIGHT DIABETIC;  Surgeon: Leandrew Koyanagi, MD;  Location: [place];  Service: Ophthalmology;  Laterality: Right;  DIABETIC, ORAL MED  . CATARACT EXTRACTION W/PHACO Left 04/30/2018   Procedure: CATARACT EXTRACTION PHACO AND INTRAOCULAR LENS PLACEMENT (IOC);  Surgeon: Leandrew Koyanagi, MD;  Location: [facility];  Service: Ophthalmology;  Laterality: Left;  [place] 01:04 AP% 21.3 CDE 13.66 Fluid pack lot # [id number]  . COLONOSCOPY WITH PROPOFOL N/A 03/25/2018   Procedure: COLONOSCOPY WITH PROPOFOL;  Surgeon: Lin Landsman, MD;  Location: [facility];  Service: Gastroenterology;  Laterality: N/A;  . DENTAL SURGERY     EXTRACTIONS  . ELECTROMAGNETIC NAVIGATION BROCHOSCOPY Right 01/27/2019   Procedure: ELECTROMAGNETIC NAVIGATION BRONCHOSCOPY;  Surgeon: [name], MD;  Location: [facility];  Service: Cardiopulmonary;  Laterality: Right;  . ESOPHAGOGASTRODUODENOSCOPY N/A 08/03/2018   Procedure: ESOPHAGOGASTRODUODENOSCOPY (EGD);  Surgeon: Lin Landsman, MD;  Location: [facility] ENDOSCOPY;  Service: Gastroenterology;  Laterality: N/A;  . ESOPHAGOGASTRODUODENOSCOPY (EGD) WITH PROPOFOL N/A 02/10/2018   Procedure: ESOPHAGOGASTRODUODENOSCOPY  (EGD) WITH PROPOFOL;  Surgeon: Jonathon Bellows, MD;  Location: [facility] END[facility]COPY;  Service: Gastroenterology;  Laterality: N/A;  . ESOPHAGOGASTRODUODENOSCOPY (EGD) WITH PROPOFOL N/A 03/25/2018   Procedure: ESOPHAGOGASTRODUODENOSCOPY (EGD) WITH PROPOFOL;  Surgeon: Lin Landsman, MD;  Location: [facility] ENDOSCOPY;  Service: Gastroenterology;  Laterality: N/A;  . ESOPHAGOGASTRODUODENOSCOPY (EGD) WITH PROPOFOL N/A 04/22/2018   Procedure: ESOPHAGOGASTRODUODENOSCOPY (EGD) WITH PROPOFOL with band ligation;  Surgeon: Lin Landsman, MD;  Location: [place];  Service: Gastroenterology;  Laterality: N/A;  . ESOPHAGOGASTRODUODENOSCOPY (EGD) WITH PROPOFOL N/A 06/24/2018   Procedure: ESOPHAGOGASTRODUODENOSCOPY (EGD) WITH PROPOFOL;  Surgeon: Lin Landsman, MD;  Location: [facility] ENDOSCOPY;  Service: Gastroenterology;  Laterality: N/A;  . ESOPHAGOGASTRODUODENOSCOPY (EGD) WITH PROPOFOL N/A 11/19/2018   Procedure: ESOPHAGOGASTRODUODENOSCOPY (EGD) WITH PROPOFOL;  Surgeon: Lin Landsman, MD;  Location: [facility] ENDOSCOPY;  Service: Gastroenterology;  Laterality: N/A;  . EYE SURGERY    . HEMORRHOID BANDING  03/25/2018   Procedure: HEMORRHOID BANDING;  Surgeon: Lin Landsman, MD;  Location: [facility] ENDOSCOPY;  Service: Gastroenterology;;  . IR EMBO ART  VEN HEMORR LYMPH EXTRAV  INC GUIDE ROADMAPPING  08/07/2018  . IR RADIOLOGIST EVAL & MGMT  09/03/2018  . IR RADIOLOGIST EVAL & MGMT  12/17/2018  . IR RADIOLOGIST EVAL & MGMT  06/24/2019  . IR TIPS  08/07/2018  . RADIOLOGY WITH ANESTHESIA N/A 08/07/2018   Procedure: TIPS;  Surgeon: Corrie Mckusick, DO;  Location: [place];  Service: Anesthesiology;  Laterality: N/A;  . TONSILLECTOMY      Prior to Admission medications   Medication Sig Start Date End Date Taking? Authorizing Provider  cefdinir (OMNICEF) 300 MG capsule  05/19/19   [provider]  cephALEXin (KEFLEX) 500 MG capsule TAKE 1 CAPSULE BY MOUTH THREE TIMES A DAY 04/23/19   [provider]   cyanocobalamin (,VITAMIN B-12,) 1000 MCG/ML injection Inject 1,000 mcg into the muscle every 30 (thirty) days.     [provider]  doxycycline (VIBRAMYCIN) 100 MG capsule doxycycline hyclate 100 mg capsule  Take 1 capsule twice a day by oral route for 21 days.    [provider]  fluconazole (DIFLUCAN) 150 MG tablet Take 150 mg by mouth daily. 04/23/19   [provider]  furosemide (LASIX) 20 MG tablet TAKE 2 TABLETS BY MOUTH EVERY DAY 08/11/19   Vanga, Tally Due, MD  hydrOXYzine (ATARAX/VISTARIL) 10 MG tablet TAKE 1 TABLET (10 MG TOTAL) BY MOUTH 3 (THREE) TIMES DAILY AS NEEDED FOR ITCHING. 07/16/19   Vanga, Tally Due, MD  lactulose (CHRONULAC) 10 GM/15ML solution TAKE 30 MLS (20 G TOTAL) BY MOUTH 2 (TWO) TIMES DAILY. 08/06/19   Lin Landsman, MD  metFORMIN (GLUCOPHAGE) 500 MG tablet Take 1,000 mg by mouth 2 (two) times daily with a meal.     [provider]  mupirocin ointment (BACTROBAN) 2 %  05/19/19   [provider]  nystatin (MYCOSTATIN/NYSTOP) powder Apply 1 g topically as needed (rash).  05/23/18   [provider]  omeprazole (PRILOSEC) 40 MG capsule omeprazole 40 mg capsule,delayed release  TAKE 1 CAPSULE BY MOUTH EVERY DAY    [provider]  ondansetron (ZOFRAN) 4 MG tablet Take 1 tablet (4 mg total) by mouth every 6 (six)  hours as needed for nausea. Patient not taking: Reported on 07/07/2019 08/03/18   Rosine Door, MD  ONE [facility] ULTRA TEST test strip 2 (two) times daily. 09/13/18   [provider]  [facility] DELICA LANCETS 94P MISC 2 (two) times daily. 09/13/18   [provider]  rifaximin (XIFAXAN) 550 MG TABS tablet TAKE 1 TABLET (550 MG TOTAL) BY MOUTH 2 (TWO) TIMES DAILY. 05/20/19   Lin Landsman, MD  spironolactone (ALDACTONE) 50 MG tablet TAKE 1 TABLET BY MOUTH EVERY DAY 07/16/19   Vanga, Tally Due, MD  sulfamethoxazole-trimethoprim (BACTRIM DS) 800-160 MG tablet sulfamethoxazole 800  mg-trimethoprim 160 mg tablet    [provider]  traMADol (ULTRAM) 50 MG tablet  06/20/19   [provider]  Vitamin D, Ergocalciferol, (DRISDOL) 1.25 MG (50000 UT) CAPS capsule TAKE 1 CAPSULE BY MOUTH ONE TIME PER WEEK 08/11/19   Lin Landsman, MD    No Known Allergies  Family History  Problem Relation Age of Onset  . CAD Father   . Heart attack Father 15  . Cancer Maternal Uncle   . Seizures Mother     Social History Social History   Tobacco Use  . Smoking status: Never Smoker  . Smokeless tobacco: Never Used  Substance Use Topics  . Alcohol use: Not Currently    Frequency: Never    Comment: No EtOH for 30 years.  Never a heavy drinker.  . Drug use: Never    Review of Systems Constitutional: Negative for fever. Cardiovascular: Negative for chest pain. Respiratory: Negative for shortness of breath. Gastrointestinal: Negative for abdominal pain.  Nausea 1 episode of vomiting.  Chronic diarrhea Genitourinary: Negative for urinary compaints Musculoskeletal: Negative for musculoskeletal complaints Skin: Negative for skin complaints  Neurological: Negative for headache All other ROS negative  ____________________________________________   PHYSICAL EXAM:  VITAL SIGNS: ED Triage Vitals [08/20/19 1508]  Enc Vitals Group     BP 126/63     Pulse Rate 88     Resp 16     Temp 98.8 F (37.1 C)     Temp Source Oral     SpO2 99 %     Weight 161 lb (73 kg)     Height 5' 3" (1.6 m)     Head Circumference      Peak Flow      Pain Score 0     Pain Loc      Pain Edu?      Excl. in [place]?    Constitutional: Alert and oriented. Well appearing and in no distress. Eyes: Normal exam ENT      Head: Normocephalic and atraumatic.      Mouth/Throat: Mucous membranes are moist. Cardiovascular: Normal rate, regular rhythm. No murmur Respiratory: Normal respiratory effort without tachypnea nor retractions. Breath sounds are clear Gastrointestinal: Soft and  nontender. No distention.  Musculoskeletal: Nontender with normal range of motion in all extremities.  Neurologic:  Normal speech and language. No gross focal neurologic deficits Skin:  Skin is warm, dry and intact.  Psychiatric: Mood and affect are normal.   ____________________________________________    EKG  EKG viewed and interpreted by myself shows a normal sinus rhythm 83 bpm with a narrow QRS, normal axis, normal intervals, no concerning ST changes.  ____________________________________________    RADIOLOGY  CT head is negative.  ____________________________________________   INITIAL IMPRESSION / ASSESSMENT AND PLAN / ED COURSE  Pertinent labs & imaging results that were available during my care of the  patient were reviewed by me and considered in my medical decision making (see chart for details).   Patient presents emergency department for 3 days of confusion and feeling off balance.  Differential would include hepatic encephalopathy, infectious etiology such as urinary tract infection, metabolic or electrolyte abnormality, CVA.  Patient's work-up shows a normal CT scan.  Largely reassuring urinalysis.  Chemistry does show an anion gap of 18 with a bicarb of 16.  Largely normal glucose of 160.  Bilirubin of 2.3.   I discussed the patient with Dr. Marius Ditch who is the patient's primary GI doctor.  She would like a ultrasound-guided paracentesis.  Patient has a soft nontender abdomen at this time.  He does not have any appreciable ascites for a bedside ultrasound.  She states she is very familiar with the patient states this is very atypical behavior and symptoms for the patient.  We will admit to the hospital service for continued work-up.    Danaly A Meunier was evaluated in Emergency Department on 08/20/2019 for the symptoms described in the history of present illness. She was evaluated in the context of the global COVID-19 pandemic, which necessitated consideration that the patient  might be at risk for infection with the SARS-CoV-2 virus that causes COVID-19. Institutional protocols and algorithms that pertain to the evaluation of patients at risk for COVID-19 are in a state of rapid change based on information released by regulatory bodies including the CDC and federal and state organizations. These policies and algorithms were followed during the patient's care in the ED.  ____________________________________________   FINAL CLINICAL IMPRESSION(S) / ED DIAGNOSES  Altered mental status Confusion   Harvest Dark, MD 08/20/19 2305

## 2019-08-20 NOTE — Telephone Encounter (Signed)
Crystal [name] called stating she had a liver stint placed & DR Marius Ditch states to call the office if she felt funny. She has not been able to remember for about 3 days & her balance is not good. She states she needs help putting statements together.  I then transferred call to [facility].

## 2019-08-20 NOTE — Telephone Encounter (Signed)
I see that she is now at [facility] ER.  Will follow-up on her  RV

## 2019-08-20 NOTE — H&P (Signed)
[facility] [place] NAME: Crystal [name]    MR#:  [id number]  DATE OF BIRTH:  [birth year]  DATE OF ADMISSION:  08/20/2019  PRIMARY CARE PHYSICIAN: Lorelee Market, MD   REQUESTING/REFERRING PHYSICIAN: Dr. Harvest Dark.  CHIEF COMPLAINT: Memory loss for 3 days   Chief Complaint  Patient presents with  . Memory Loss    HISTORY OF PRESENT ILLNESS:  Crystal [name]  is a 68 y.o. 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 Marius Ditch comes in because of memory loss as going on for 3 days.  Patient states that she feels very unstable when she walks and also has memory problem for last 3 days unable to remember the staff..  Patient initially work-up was done to will see if she has any elevated ammonia but patient blood work did not show any hepatic encephalopathy.  Patient blood work is largely normal, GI recommends admission for stroke work-up including MRI of the brain, paracentesis tomorrow to evaluate for any possible SBP.  Patient denies any abdominal pain, nausea, vomiting and fever. PAST MEDICAL HISTORY:   Past Medical History:  Diagnosis Date  . Acute GI bleeding 02/09/2018  . Acute upper gastrointestinal bleeding 08/03/2018  . Anemia    TAKES IRON TAB  . Arthritis    SHOULDER  . B12 deficiency 02/18/2018  . Bleeding    GI  3/19  . Bronchitis    HX OF  . Cirrhosis ([place])   . Colon cancer screening   . Diabetes mellitus without complication ([place])    TYPE 2  . Fatty (change of) liver, not elsewhere classified   . Full dentures    UPPER AND LOWER  . Iron deficiency anemia due to chronic blood loss 02/18/2018  . Lung nodule, multiple   . Pernicious anemia 02/22/2018  . PUD (peptic ulcer disease)   . Raynaud's syndrome   . Upper GI bleeding 08/03/2018    PAST SURGICAL HISTOIRY:   Past Surgical History:  Procedure Laterality Date  . CATARACT EXTRACTION W/PHACO Right  02/06/2018   Procedure: CATARACT EXTRACTION PHACO AND INTRAOCULAR LENS PLACEMENT ([place]) RIGHT DIABETIC;  Surgeon: Leandrew Koyanagi, MD;  Location: [facility];  Service: Ophthalmology;  Laterality: Right;  DIABETIC, ORAL MED  . CATARACT EXTRACTION W/PHACO Left 04/30/2018   Procedure: CATARACT EXTRACTION PHACO AND INTRAOCULAR LENS PLACEMENT ([facility]);  Surgeon: Leandrew Koyanagi, MD;  Location: [facility];  Service: Ophthalmology;  Laterality: Left;  [place] 01:04 AP% 21.3 CDE 13.66 Fluid pack lot # [id number]  . COLONOSCOPY WITH PROPOFOL N/A 03/25/2018   Procedure: COLONOSCOPY WITH PROPOFOL;  Surgeon: Lin Landsman, MD;  Location: [facility] ENDOSCOPY;  Service: Gastroenterology;  Laterality: N/A;  . DENTAL SURGERY     EXTRACTIONS  . ELECTROMAGNETIC NAVIGATION BROCHOSCOPY Right 01/27/2019   Procedure: ELECTROMAGNETIC NAVIGATION BRONCHOSCOPY;  Surgeon: [name], MD;  Location: [facility];  Service: Cardiopulmonary;  Laterality: Right;  . ESOPHAGOGASTRODUODENOSCOPY N/A 08/03/2018   Procedure: ESOPHAGOGASTRODUODENOSCOPY (EGD);  Surgeon: Lin Landsman, MD;  Location[facility] ENDOSCOPY;  Service: Gastroenterology;  Laterality: N/A;  . ESOPHAGOGASTRODUODENOSCOPY (EGD) WITH PROPOFOL N/A 02/10/2018   Procedure: ESOPHAGOGASTRODUODENOSCOPY (EGD) WITH PROPOFOL;  Surgeon: Jonathon Bellows, MD;  Location: [facility] ENDOSCOPY;  Service: Gastroenterology;  Laterality: N/A;  . ESOPHAGOGASTRODUODENOSCOPY (EGD) WITH PROPOFOL N/A 03/25/2018   Procedure: ESOPHAGOGASTRODUODENOSCOPY (EGD) WITH PROPOFOL;  Surgeon: Lin Landsman, MD;  Location: [facility] ENDOSCOPY;  Service: Gastroenterology;  Laterality: N/A;  .  ESOPHAGOGASTRODUODENOSCOPY (EGD) WITH PROPOFOL N/A 04/22/2018   Procedure: ESOPHAGOGASTRODUODENOSCOPY (EGD) WITH PROPOFOL with band ligation;  Surgeon: Lin Landsman, MD;  Location: [facility];  Service: Gastroenterology;  Laterality: N/A;  . ESOPHAGOGASTRODUODENOSCOPY (EGD) WITH PROPOFOL N/A 06/24/2018    Procedure: ESOPHAGOGASTRODUODENOSCOPY (EGD) WITH PROPOFOL;  Surgeon: Lin Landsman, MD;  Location: [facility] ENDOSCOPY;  Service: Gastroenterology;  Laterality: N/A;  . ESOPHAGOGASTRODUODENOSCOPY (EGD) WITH PROPOFOL N/A 11/19/2018   Procedure: ESOPHAGOGASTRODUODENOSCOPY (EGD) WITH PROPOFOL;  Surgeon: Lin Landsman, MD;  Location: [facility] ENDOSCOPY;  Service: Gastroenterology;  Laterality: N/A;  . EYE SURGERY    . HEMORRHOID BANDING  03/25/2018   Procedure: HEMORRHOID BANDING;  Surgeon: Lin Landsman, MD;  Location: [facility] ENDOSCOPY;  Service: Gastroenterology;;  . IR EMBO ART  VEN HEMORR LYMPH EXTRAV  INC GUIDE ROADMAPPING  08/07/2018  . IR RADIOLOGIST EVAL & MGMT  09/03/2018  . IR RADIOLOGIST EVAL & MGMT  12/17/2018  . IR RADIOLOGIST EVAL & MGMT  06/24/2019  . IR TIPS  08/07/2018  . RADIOLOGY WITH ANESTHESIA N/A 08/07/2018   Procedure: TIPS;  Surgeon: Corrie Mckusick, DO;  Location: [place];  Service: Anesthesiology;  Laterality: N/A;  . TONSILLECTOMY      SOCIAL HISTORY:   Social History   Tobacco Use  . Smoking status: Never Smoker  . Smokeless tobacco: Never Used  Substance Use Topics  . Alcohol use: Not Currently    Frequency: Never    Comment: No EtOH for 30 years.  Never a heavy drinker.    FAMILY HISTORY:   Family History  Problem Relation Age of Onset  . CAD Father   . Heart attack Father 4  . Cancer Maternal Uncle   . Seizures Mother     DRUG ALLERGIES:  No Known Allergies  REVIEW OF SYSTEMS:  CONSTITUTIONAL: No fever, fatigue or weakness.  EYES: No blurred or double vision.  EARS, NOSE, AND THROAT: No tinnitus or ear pain.  RESPIRATORY: No cough, shortness of breath, wheezing or hemoptysis.  CARDIOVASCULAR: No chest pain, orthopnea, edema.  GASTROINTESTINAL: No nausea, vomiting, diarrhea or abdominal pain.  GENITOURINARY: No dysuria, hematuria.  ENDOCRINE: No polyuria, nocturia,  HEMATOLOGY: HISTORY OF B12 DEFICIENCY ANEMIA SKIN: No rash or  lesion. MUSCULOSKELETAL: No joint pain or arthritis.   NEUROLOGIC: No tingling, numbness, weakness.  Patient has been feeling memory problems for the past 3 days, also has gait imbalance. PSYCHIATRY: No anxiety or depression.   MEDICATIONS AT HOME:   Prior to Admission medications   Medication Sig Start Date End Date Taking? Authorizing Provider  cyanocobalamin (,VITAMIN B-12,) 1000 MCG/ML injection Inject 1,000 mcg into the muscle every 30 (thirty) days.    Yes [provider]  furosemide (LASIX) 20 MG tablet TAKE 2 TABLETS BY MOUTH EVERY DAY Patient taking differently: Take 40 mg by mouth daily.  08/11/19  Yes Vanga, Tally Due, MD  hydrOXYzine (ATARAX/VISTARIL) 10 MG tablet TAKE 1 TABLET (10 MG TOTAL) BY MOUTH 3 (THREE) TIMES DAILY AS NEEDED FOR ITCHING. 07/16/19  Yes Vanga, Tally Due, MD  lactulose (CHRONULAC) 10 GM/15ML solution TAKE 30 MLS (20 G TOTAL) BY MOUTH 2 (TWO) TIMES DAILY. Patient taking differently: Take 30 g by mouth 2 (two) times daily.  08/06/19  Yes Vanga, Tally Due, MD  metFORMIN (GLUCOPHAGE) 500 MG tablet Take 1,000 mg by mouth 2 (two) times daily with a meal.    Yes [provider]  nystatin (MYCOSTATIN/NYSTOP) powder Apply 1 g topically as needed (rash).  05/23/18  Yes [provider]  pantoprazole (PROTONIX) 40 MG tablet Take 40 mg by mouth 2 (two) times daily. 06/29/19  Yes [provider]  rifaximin (XIFAXAN) 550 MG TABS tablet TAKE 1 TABLET (550 MG TOTAL) BY MOUTH 2 (TWO) TIMES DAILY. Patient taking differently: Take 550 mg by mouth 2 (two) times daily.  05/20/19  Yes Vanga, Tally Due, MD  spironolactone (ALDACTONE) 50 MG tablet TAKE 1 TABLET BY MOUTH EVERY DAY Patient taking differently: Take 50 mg by mouth daily.  07/16/19  Yes Vanga, Tally Due, MD  traMADol (ULTRAM) 50 MG tablet Take 50 mg by mouth every 4 (four) hours as needed for moderate pain.  06/20/19  Yes [provider]  Vitamin D, Ergocalciferol, (DRISDOL)  1.25 MG (50000 UT) CAPS capsule TAKE 1 CAPSULE BY MOUTH ONE TIME PER WEEK Patient taking differently: Take 50,000 Units by mouth once a week.  08/11/19  Yes Vanga, Tally Due, MD  ondansetron (ZOFRAN) 4 MG tablet Take 1 tablet (4 mg total) by mouth every 6 (six) hours as needed for nausea. Patient not taking: Reported on 07/07/2019 08/03/18   Rosine Door, MD      VITAL SIGNS:  Blood pressure 118/65, pulse 83, temperature 98.8 F (37.1 C), temperature source Oral, resp. rate 18, height 5' 3" (1.6 m), weight 73 kg, SpO2 94 %.  PHYSICAL EXAMINATION:  GENERAL:  68 y.o.-year-old patient lying in the bed with no acute distress.  EYES: Pupils equal, round, reactive to light and accommodation. No scleral icterus. Extraocular muscles intact.  HEENT: Head atraumatic, normocephalic. Oropharynx and nasopharynx clear.  NECK:  Supple, no jugular venous distention. No thyroid enlargement, no tenderness.  LUNGS: Normal breath sounds bilaterally, no wheezing, rales,rhonchi or crepitation. No use of accessory muscles of respiration.  CARDIOVASCULAR: S1, S2 normal. No murmurs, rubs, or gallops.  ABDOMEN: Soft, nontender, nondistended. Bowel sounds present. No organomegaly or mass.  EXTREMITIES: No pedal edema, cyanosis, or clubbing.  NEUROLOGIC: Cranial nerves II through XII are intact. Muscle strength 5/5 in all extremities. Sensation intact. Gait not checked.  PSYCHIATRIC: The patient is alert and oriented x 3.  SKIN: No obvious rash, lesion, or ulcer.   LABORATORY PANEL:   CBC Recent Labs  Lab 08/20/19 1533  WBC 4.3  HGB 12.9  HCT 40.6  PLT 118*   ------------------------------------------------------------------------------------------------------------------  Chemistries  Recent Labs  Lab 08/20/19 1533  NA 136  K 3.8  CL 102  CO2 16*  GLUCOSE 166*  BUN 17  CREATININE 1.35*  CALCIUM 9.9  AST 60*  ALT 24  ALKPHOS 134*  BILITOT 2.3*    ------------------------------------------------------------------------------------------------------------------  Cardiac Enzymes No results for input(s): TROPONINI in the last 168 hours. ------------------------------------------------------------------------------------------------------------------  RADIOLOGY:  Ct Head Wo Contrast  Result Date: 08/20/2019 CLINICAL DATA:  Liver stent placement.  Feeling funny. EXAM: CT HEAD WITHOUT CONTRAST TECHNIQUE: Contiguous axial images were obtained from the base of the skull through the vertex without intravenous contrast. COMPARISON:  None. FINDINGS: Brain: No evidence of acute infarction, hemorrhage, hydrocephalus, extra-axial collection or mass lesion/mass effect. Vascular: Intracranial atherosclerotic disease. Skull: No osseous abnormality. Sinuses/Orbits: Visualized paranasal sinuses are clear. Visualized mastoid sinuses are clear. Visualized orbits demonstrate no focal abnormality. Other: None IMPRESSION: No acute intracranial pathology. Electronically Signed   By: Kathreen Devoid   On: 08/20/2019 16:17    EKG:   Orders placed or performed during the hospital encounter of 08/20/19  . ED EKG  . ED EKG    IMPRESSION AND PLAN:   68 year old female  patient with history of decompensated liver cirrhosis with child Pugh class B, status post TIPS procedure, has history of portal hypertension, ascites being followed by GI closely comes in because of memory disturbances, gait imbalance for 3 days.  #1. memory problems, gait imbalance, rule out TIA, CT head unremarkable, ordered MRI of the brain, neurochecks, ammonia level is normal. 2.  History of liver cirrhosis status post TIPS procedure, history of ascites noted by gastroenterology, patient blood work largely normal but GI wanted admission anyway to rule out stroke, paracentesis, order ultrasound-guided paracentesis. 3.  Dehydration, hold Lasix, Aldactone at this time.  Patient does not look like  she is volume overloaded at this time. #4 anemia, multifactorial secondary to peptic ulcer disease, B12 deficiency anemia, followed by Dr. Mike Gip. 5.  History of right lower lobe lung nodule, retroperitoneal adenopathy, follows up with Dr. Mike Gip, patient had also bronchoscopy which showed no malignancy, monitor the lesions with imaging for now as per the note.  History   All the records are reviewed and case discussed with ED provider. Management plans discussed with the patient, family and they are in agreement.  CODE STATUS: Full code  TOTAL TIME TAKING CARE OF THIS PATIENT: 55 minutes.    Epifanio Lesches M.D on 08/20/2019 at 7:02 PM  Between 7am to 6pm - Pager - [phone]  After 6pm go to [email or web address] - password [email or web address] [facility]  Office  [phone]  CC: Primary care physician; Lorelee Market, MD  Note: This dictation was prepared with Dragon dictation along with smaller phrase technology. Any transcriptional errors that result from this process are unintentional.

## 2019-08-20 NOTE — ED Triage Notes (Signed)
Patient and husband report worsening confusion and memory loss x3 days. States she has been slow to preform daily tasks. Patient states she had TIPS procedure done last year and was told to keep an eye out for memory loss. Patient states PCP informed her to come be evaluated for possible UTI or stroke. Patient's friend states she has had trouble with balance as well. Also reports N/V.

## 2019-08-21 ENCOUNTER — Observation Stay: Payer: Medicare HMO

## 2019-08-21 DIAGNOSIS — K7460 Unspecified cirrhosis of liver: Secondary | ICD-10-CM | POA: Diagnosis not present

## 2019-08-21 DIAGNOSIS — L739 Follicular disorder, unspecified: Secondary | ICD-10-CM | POA: Diagnosis present

## 2019-08-21 DIAGNOSIS — I7300 Raynaud's syndrome without gangrene: Secondary | ICD-10-CM | POA: Diagnosis not present

## 2019-08-21 DIAGNOSIS — Z20828 Contact with and (suspected) exposure to other viral communicable diseases: Secondary | ICD-10-CM | POA: Diagnosis not present

## 2019-08-21 DIAGNOSIS — Z809 Family history of malignant neoplasm, unspecified: Secondary | ICD-10-CM | POA: Diagnosis not present

## 2019-08-21 DIAGNOSIS — R2681 Unsteadiness on feet: Secondary | ICD-10-CM | POA: Diagnosis not present

## 2019-08-21 DIAGNOSIS — E876 Hypokalemia: Secondary | ICD-10-CM | POA: Diagnosis present

## 2019-08-21 DIAGNOSIS — F413 Other mixed anxiety disorders: Secondary | ICD-10-CM | POA: Diagnosis present

## 2019-08-21 DIAGNOSIS — K766 Portal hypertension: Secondary | ICD-10-CM | POA: Diagnosis not present

## 2019-08-21 DIAGNOSIS — Z7984 Long term (current) use of oral hypoglycemic drugs: Secondary | ICD-10-CM | POA: Diagnosis not present

## 2019-08-21 DIAGNOSIS — Z23 Encounter for immunization: Secondary | ICD-10-CM | POA: Diagnosis not present

## 2019-08-21 DIAGNOSIS — E538 Deficiency of other specified B group vitamins: Secondary | ICD-10-CM | POA: Diagnosis not present

## 2019-08-21 DIAGNOSIS — Z8249 Family history of ischemic heart disease and other diseases of the circulatory system: Secondary | ICD-10-CM | POA: Diagnosis not present

## 2019-08-21 DIAGNOSIS — E860 Dehydration: Secondary | ICD-10-CM | POA: Diagnosis present

## 2019-08-21 DIAGNOSIS — R140 Abdominal distension (gaseous): Secondary | ICD-10-CM | POA: Diagnosis not present

## 2019-08-21 DIAGNOSIS — G9341 Metabolic encephalopathy: Secondary | ICD-10-CM | POA: Diagnosis not present

## 2019-08-21 DIAGNOSIS — E119 Type 2 diabetes mellitus without complications: Secondary | ICD-10-CM | POA: Diagnosis not present

## 2019-08-21 DIAGNOSIS — K7030 Alcoholic cirrhosis of liver without ascites: Secondary | ICD-10-CM | POA: Diagnosis present

## 2019-08-21 DIAGNOSIS — R69 Illness, unspecified: Secondary | ICD-10-CM | POA: Diagnosis not present

## 2019-08-21 DIAGNOSIS — R4182 Altered mental status, unspecified: Secondary | ICD-10-CM | POA: Diagnosis not present

## 2019-08-21 DIAGNOSIS — Z8711 Personal history of peptic ulcer disease: Secondary | ICD-10-CM | POA: Diagnosis not present

## 2019-08-21 LAB — BASIC METABOLIC PANEL
Anion gap: 10 (ref 5–15)
BUN: 13 mg/dL (ref 8–23)
CO2: 24 mmol/L (ref 22–32)
Calcium: 9.2 mg/dL (ref 8.9–10.3)
Chloride: 104 mmol/L (ref 98–111)
Creatinine, Ser: 0.98 mg/dL (ref 0.44–1.00)
GFR calc Af Amer: >60 mL/min (ref >60)
GFR calc non Af Amer: 60 mL/min — ABNORMAL LOW (ref >60)
Glucose, Bld: 75 mg/dL (ref 70–99)
Potassium: 2.9 mmol/L — ABNORMAL LOW (ref 3.5–5.1)
Sodium: 138 mmol/L (ref 135–145)

## 2019-08-21 LAB — CBC
HCT: 36.6 % (ref 36.0–46.0)
Hemoglobin: 12.4 g/dL (ref 12.0–15.0)
MCH: 28.2 pg (ref 26.0–34.0)
MCHC: 33.9 g/dL (ref 30.0–36.0)
MCV: 83.2 fL (ref 80.0–100.0)
Platelets: 111 10*3/uL — ABNORMAL LOW (ref 150–400)
RBC: 4.4 MIL/uL (ref 3.87–5.11)
RDW: 15.4 % (ref 11.5–15.5)
WBC: 3.6 10*3/uL — ABNORMAL LOW (ref 4.0–10.5)
nRBC: 0 % (ref 0.0–0.2)

## 2019-08-21 LAB — GLUCOSE, CAPILLARY
Glucose-Capillary: 125 mg/dL — ABNORMAL HIGH (ref 70–99)
Glucose-Capillary: 61 mg/dL — ABNORMAL LOW (ref 70–99)
Glucose-Capillary: 140 mg/dL — ABNORMAL HIGH (ref 70–99)
Glucose-Capillary: 89 mg/dL (ref 70–99)
Glucose-Capillary: 96 mg/dL (ref 70–99)
Glucose-Capillary: 139 mg/dL — ABNORMAL HIGH (ref 70–99)
Glucose-Capillary: 124 mg/dL — ABNORMAL HIGH (ref 70–99)

## 2019-08-21 LAB — HEMOGLOBIN A1C
Hgb A1c MFr Bld: 5.2 % (ref 4.8–5.6)
Mean Plasma Glucose: 102.54 mg/dL

## 2019-08-21 LAB — HIV ANTIBODY (ROUTINE TESTING W REFLEX): HIV Screen 4th Generation wRfx: NONREACTIVE

## 2019-08-21 LAB — SARS CORONAVIRUS 2 (TAT 6-24 HRS): SARS Coronavirus 2: NEGATIVE

## 2019-08-21 LAB — MAGNESIUM: Magnesium: 1.8 mg/dL (ref 1.7–2.4)

## 2019-08-21 MED ORDER — SODIUM CHLORIDE 0.9 % IV SOLN
INTRAVENOUS | Status: DC | PRN
  Administered 2019-08-21: 250 mL via INTRAVENOUS

## 2019-08-21 MED ORDER — DEXTROSE 50 % IV SOLN
1.0000 | Freq: Once | INTRAVENOUS | Status: AC
  Administered 2019-08-21: 50 mL via INTRAVENOUS

## 2019-08-21 MED ORDER — DEXTROSE 50 % IV SOLN
INTRAVENOUS | Status: AC
  Filled 2019-08-21: qty 50

## 2019-08-21 MED ORDER — PNEUMOCOCCAL 13-VAL CONJ VACC IM SUSP
0.5000 mL | INTRAMUSCULAR | Status: AC
  Administered 2019-08-22: 0.5 mL via INTRAMUSCULAR
  Filled 2019-08-21: qty 0.5

## 2019-08-21 MED ORDER — POTASSIUM CHLORIDE CRYS ER 20 MEQ PO TBCR
40.0000 meq | EXTENDED_RELEASE_TABLET | Freq: Once | ORAL | Status: AC
  Administered 2019-08-21: 40 meq via ORAL
  Filled 2019-08-21: qty 2

## 2019-08-21 MED ORDER — POTASSIUM CHLORIDE 10 MEQ/100ML IV SOLN
10.0000 meq | INTRAVENOUS | Status: AC
  Administered 2019-08-21 (×4): 10 meq via INTRAVENOUS
  Filled 2019-08-21 (×4): qty 100

## 2019-08-21 NOTE — Progress Notes (Signed)
Rechecked blood sugar and it was 140.  Will continue to monitor.  Christene Slates

## 2019-08-21 NOTE — Progress Notes (Signed)
[facility] [place] NAME: Crystal [name]    MR#:  [id number]  DATE OF BIRTH:  08-12-1951  SUBJECTIVE:  CHIEF COMPLAINT:   Chief Complaint  Patient presents with  . Memory Loss  Patient seen and evaluated today Patient awake and responds to verbal commands No confusion No tingling or numbness  REVIEW OF SYSTEMS:    ROS  CONSTITUTIONAL: No documented fever. No fatigue, weakness. No weight gain, no weight loss.  EYES: No blurry or double vision.  ENT: No tinnitus. No postnasal drip. No redness of the oropharynx.  RESPIRATORY: No cough, no wheeze, no hemoptysis. No dyspnea.  CARDIOVASCULAR: No chest pain. No orthopnea. No palpitations. No syncope.  GASTROINTESTINAL: No nausea, no vomiting or diarrhea. No abdominal pain. No melena or hematochezia.  GENITOURINARY: No dysuria or hematuria.  ENDOCRINE: No polyuria or nocturia. No heat or cold intolerance.  HEMATOLOGY: No anemia. No bruising. No bleeding.  INTEGUMENTARY: No rashes. No lesions.  MUSCULOSKELETAL: No arthritis. No swelling. No gout.  NEUROLOGIC: No numbness, tingling, or ataxia. No seizure-type activity.  PSYCHIATRIC: No anxiety. No insomnia. No ADD.   DRUG ALLERGIES:  No Known Allergies  VITALS:  Blood pressure (!) 102/57, pulse 75, temperature 98.4 F (36.9 C), temperature source Oral, resp. rate 15, height 5' 3" (1.6 m), weight 71.9 kg, SpO2 98 %.  PHYSICAL EXAMINATION:   Physical Exam  GENERAL:  68 y.o.-year-old patient lying in the bed with no acute distress.  EYES: Pupils equal, round, reactive to light and accommodation. No scleral icterus. Extraocular muscles intact.  HEENT: Head atraumatic, normocephalic. Oropharynx and nasopharynx clear.  NECK:  Supple, no jugular venous distention. No thyroid enlargement, no tenderness.  LUNGS: Normal breath sounds bilaterally, no wheezing, rales, rhonchi. No use of accessory muscles of respiration.  CARDIOVASCULAR: S1, S2  normal. No murmurs, rubs, or gallops.  ABDOMEN: Soft, nontender, nondistended. Bowel sounds present. No organomegaly or mass.  EXTREMITIES: No cyanosis, clubbing or edema b/l.    NEUROLOGIC: Cranial nerves II through XII are intact. No focal Motor or sensory deficits b/l.   PSYCHIATRIC: The patient is alert and oriented x 3.  SKIN: No obvious rash, lesion, or ulcer.   LABORATORY PANEL:   CBC Recent Labs  Lab 08/21/19 0439  WBC 3.6*  HGB 12.4  HCT 36.6  PLT 111*   ------------------------------------------------------------------------------------------------------------------ Chemistries  Recent Labs  Lab 08/20/19 1533 08/21/19 0439  NA 136 138  K 3.8 2.9*  CL 102 104  CO2 16* 24  GLUCOSE 166* 75  BUN 17 13  CREATININE 1.35* 0.98  CALCIUM 9.9 9.2  MG  --  1.8  AST 60*  --   ALT 24  --   ALKPHOS 134*  --   BILITOT 2.3*  --    ------------------------------------------------------------------------------------------------------------------  Cardiac Enzymes No results for input(s): TROPONINI in the last 168 hours. ------------------------------------------------------------------------------------------------------------------  RADIOLOGY:  Ct Head Wo Contrast  Result Date: 08/20/2019 CLINICAL DATA:  Liver stent placement.  Feeling funny. EXAM: CT HEAD WITHOUT CONTRAST TECHNIQUE: Contiguous axial images were obtained from the base of the skull through the vertex without intravenous contrast. COMPARISON:  None. FINDINGS: Brain: No evidence of acute infarction, hemorrhage, hydrocephalus, extra-axial collection or mass lesion/mass effect. Vascular: Intracranial atherosclerotic disease. Skull: No osseous abnormality. Sinuses/Orbits: Visualized paranasal sinuses are clear. Visualized mastoid sinuses are clear. Visualized orbits demonstrate no focal abnormality. Other: None IMPRESSION: No acute intracranial pathology. Electronically Signed   By: Kathreen Devoid   On:  08/20/2019  16:17   Mr Brain Wo Contrast  Result Date: 08/20/2019 CLINICAL DATA:  Altered mental status (AMS), unclear cause. 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 Memory loss ongoing for 3 days. Patient reports feeling very unstable when walking. EXAM: MRI HEAD WITHOUT CONTRAST TECHNIQUE: Multiplanar, multiecho pulse sequences of the brain and surrounding structures were obtained without intravenous contrast. COMPARISON:  Head CT performed earlier the same day 08/20/2019. FINDINGS: Brain: There is no convincing evidence of acute infarct. No evidence of intracranial mass. No midline shift or extra-axial fluid collection. No chronic intracranial blood products. There is symmetric FLAIR hyperintensity of the bilateral mamillary bodies. Minimal scattered and ill-defined T2/FLAIR hyperintensity within the cerebral white matter is nonspecific, but consistent with chronic small vessel ischemic disease. Cerebral volume is normal for age. Vascular: Flow voids maintained within the proximal large arterial vessels. Skull and upper cervical spine: No focal marrow lesion. Sinuses/Orbits: Visualized orbits demonstrate no acute abnormality. Susceptibility artifact limits evaluation of the maxillary sinuses. Mild paranasal sinus mucosal thickening. No significant mastoid effusion. IMPRESSION: 1. Symmetric FLAIR hyperintensity within the bilateral mamillary bodies. This finding may be seen in the setting of Wernicke encephalopathy, and clinical correlation is recommended. 2. No evidence of acute infarct. 3. Mild chronic small vessel ischemic disease. Electronically Signed   By: Kellie Simmering   On: 08/20/2019 19:55   US Abdomen Limited  Result Date: 08/21/2019 CLINICAL DATA:  Abdominal distension and possible ascites EXAM: LIMITED ABDOMEN ULTRASOUND FOR ASCITES TECHNIQUE: Limited ultrasound survey for ascites was performed in all four abdominal  quadrants. COMPARISON:  None. FINDINGS: Ultrasound evaluation in all 4 quadrants reveals no significant ascites to allow for safe paracentesis. IMPRESSION: No ascites is identified.  Paracentesis was not performed. Electronically Signed   By: Inez Catalina M.D.   On: 08/21/2019 11:36     ASSESSMENT AND PLAN:   68 year old female patient with history of cirrhosis of liver, GI bleed in the past, type 2 diabetes mellitus, pernicious anemia, peptic ulcer disease currently under hospitalist service  -Acute hypokalemia Admit patient to inpatient service Aggressive potassium replacement intravenously Cardiac monitoring  -Cirrhosis of liver Ultrasound of abdomen showed no ascites Paracentesis deferred Continue Aldactone, Lasix if blood pressure permits Supportive care Continue oral  -Type 2 diabetes mellitus Diabetic diet Sliding scale coverage with insulin  -DVT prophylaxis subcu heparin  All the records are reviewed and case discussed with Care Management/Social Worker. Management plans discussed with the patient, family and they are in agreement.  CODE STATUS: Full code  DVT Prophylaxis: SCDs  TOTAL TIME TAKING CARE OF THIS PATIENT: 38 minutes.   POSSIBLE D/C IN 1 to 2 DAYS, DEPENDING ON CLINICAL CONDITION.  Saundra Shelling M.D on 08/21/2019 at 11:59 AM  Between 7am to 6pm - Pager - [phone]  After 6pm go to [email or web address] - password [email or web address] [place] [facility]  Office  [phone]  CC: Primary care physician; Lorelee Market, MD  Note: This dictation was prepared with Dragon dictation along with smaller phrase technology. Any transcriptional errors that result from this process are unintentional.

## 2019-08-21 NOTE — Progress Notes (Signed)
Rechecked patient's blood sugar around 0100 and it was 125.  Will continue to monitor.  Christene Slates  08/21/2019  2:49 AM

## 2019-08-21 NOTE — Progress Notes (Signed)
Patient was transported off unit for paracentesis.  Darnelle Catalan, RN

## 2019-08-21 NOTE — Progress Notes (Signed)
Order received from Dr Estanislado Pandy for PT

## 2019-08-21 NOTE — Progress Notes (Signed)
Patient's blood sugar dropped to 61. Gave patient orange juice and called Crystal [name] regarding the low blood sugar.  Instructed to give an ampule of D50. Will recheck in an hour.  Crystal [name]

## 2019-08-22 ENCOUNTER — Other Ambulatory Visit: Payer: Self-pay | Admitting: Gastroenterology

## 2019-08-22 LAB — GLUCOSE, CAPILLARY
Glucose-Capillary: 103 mg/dL — ABNORMAL HIGH (ref 70–99)
Glucose-Capillary: 149 mg/dL — ABNORMAL HIGH (ref 70–99)

## 2019-08-22 LAB — POTASSIUM: Potassium: 3.7 mmol/L (ref 3.5–5.1)

## 2019-08-22 MED ORDER — AMOXICILLIN-POT CLAVULANATE 875-125 MG PO TABS: 1.0000 | ORAL_TABLET | Freq: Two times a day (BID) | ORAL | 0 refills | Status: AC

## 2019-08-22 MED ORDER — AMOXICILLIN-POT CLAVULANATE 875-125 MG PO TABS
1.0000 | ORAL_TABLET | Freq: Two times a day (BID) | ORAL | Status: DC
  Administered 2019-08-22: 1 via ORAL
  Filled 2019-08-22: qty 1

## 2019-08-22 MED ORDER — POTASSIUM CHLORIDE ER 10 MEQ PO TBCR: 10.0000 meq | EXTENDED_RELEASE_TABLET | Freq: Every day | ORAL | 0 refills | Status: DC

## 2019-08-22 NOTE — Progress Notes (Signed)
Vandy A [place] A and O x4. VSS. Pt tolerating diet well. No complaints of nausea or vomiting. IV removed intact, prescriptions given. Pt voices understanding of discharge instructions with no further questions. Patient discharged via wheelchair with NT  Allergies as of 08/22/2019   No Known Allergies     Medication List    STOP taking these medications   ondansetron 4 MG tablet Commonly known as: ZOFRAN     TAKE these medications   amoxicillin-clavulanate 875-125 MG tablet Commonly known as: AUGMENTIN Take 1 tablet by mouth every 12 (twelve) hours for 5 days.   cyanocobalamin 1000 MCG/ML injection Commonly known as: (VITAMIN B-12) Inject 1,000 mcg into the muscle every 30 (thirty) days.   furosemide 20 MG tablet Commonly known as: LASIX TAKE 2 TABLETS BY MOUTH EVERY DAY   hydrOXYzine 10 MG tablet Commonly known as: ATARAX/VISTARIL TAKE 1 TABLET (10 MG TOTAL) BY MOUTH 3 (THREE) TIMES DAILY AS NEEDED FOR ITCHING.   lactulose 10 GM/15ML solution Commonly known as: CHRONULAC TAKE 30 MLS (20 G TOTAL) BY MOUTH 2 (TWO) TIMES DAILY. What changed:   how much to take  how to take this  when to take this  additional instructions   metFORMIN 500 MG tablet Commonly known as: GLUCOPHAGE Take 1,000 mg by mouth 2 (two) times daily with a meal.   nystatin powder Commonly known as: MYCOSTATIN/NYSTOP Apply 1 g topically as needed (rash).   pantoprazole 40 MG tablet Commonly known as: PROTONIX Take 40 mg by mouth 2 (two) times daily.   potassium chloride 10 MEQ tablet Commonly known as: Klor-Con 10 Take 1 tablet (10 mEq total) by mouth daily for 4 days.   rifaximin 550 MG Tabs tablet Commonly known as: Xifaxan TAKE 1 TABLET (550 MG TOTAL) BY MOUTH 2 (TWO) TIMES DAILY. What changed:   how much to take  how to take this  when to take this  additional instructions   spironolactone 50 MG tablet Commonly known as: ALDACTONE TAKE 1 TABLET BY MOUTH EVERY DAY   traMADol  50 MG tablet Commonly known as: ULTRAM Take 50 mg by mouth every 4 (four) hours as needed for moderate pain.   Vitamin D (Ergocalciferol) 1.25 MG (50000 UT) Caps capsule Commonly known as: DRISDOL TAKE 1 CAPSULE BY MOUTH ONE TIME PER WEEK What changed: See the new instructions.       Vitals:   08/22/19 0817 08/22/19 1140  BP: (!) 101/59 101/62  Pulse: 77 68  Resp: 16 16  Temp: 98.3 F (36.8 C) 97.9 F (36.6 C)  SpO2: 100% 100%    Crystal [name]

## 2019-08-22 NOTE — Progress Notes (Signed)
BP is low. Order given from Dr Estanislado Pandy to hold am lasix and spironolactone

## 2019-08-22 NOTE — Discharge Summary (Addendum)
[facility] [place] NAME: Crystal [name]    MR#:  [id number]  DATE OF BIRTH:  [birth year]  DATE OF ADMISSION:  08/20/2019 ADMITTING PHYSICIAN: Epifanio Lesches, MD  DATE OF DISCHARGE: 08/22/2019  PRIMARY CARE PHYSICIAN: Lorelee Market, MD   ADMISSION DIAGNOSIS:  Ascites [R18.8]  DISCHARGE DIAGNOSIS:  Active Problems:   AMS (altered mental status)   Hypokalemia Cirrhosis of liver Folliculitis Type 2 diabetes mellitus SECONDARY DIAGNOSIS:   Past Medical History:  Diagnosis Date  . Acute GI bleeding 02/09/2018  . Acute upper gastrointestinal bleeding 08/03/2018  . Anemia    TAKES IRON TAB  . Arthritis    SHOULDER  . B12 deficiency 02/18/2018  . Bleeding    GI  3/19  . Bronchitis    HX OF  . Cirrhosis ([place])   . Colon cancer screening   . Diabetes mellitus without complication ([place])    TYPE 2  . Fatty (change of) liver, not elsewhere classified   . Full dentures    UPPER AND LOWER  . Iron deficiency anemia due to chronic blood loss 02/18/2018  . Lung nodule, multiple   . Pernicious anemia 02/22/2018  . PUD (peptic ulcer disease)   . Raynaud's syndrome   . Upper GI bleeding 08/03/2018     ADMITTING HISTORY Crystal [name]  is a 68 y.o. 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 Marius Ditch comes in because of memory loss as going on for 3 days.  Patient states that she feels very unstable when she walks and also has memory problem for last 3 days unable to remember the staff..  Patient initially work-up was done to will see if she has any elevated ammonia but patient blood work did not show any hepatic encephalopathy.  Patient blood work is largely normal, GI recommends admission for stroke work-up including MRI of the brain, paracentesis tomorrow to evaluate for any possible SBP.  Patient denies any abdominal pain, nausea, vomiting and fever.  HOSPITAL COURSE:  Patient was  admitted to medical floor worked up for stroke.  She was worked up with MRI brain which did not reveal any acute pathology.  She had abdominal ultrasound which did not show any ascites.  Patient was aggressively replaced.  Potassium at the time of discharge was normal.  Patient also received oral Augmentin antibiotic for folliculitis of the lower extremity.  She will continue medical management for cirrhosis of liver as outpatient and follow-up with primary care physician in the clinic.  Her mental status is back to baseline.  CONSULTS OBTAINED:    DRUG ALLERGIES:  No Known Allergies  DISCHARGE MEDICATIONS:   Allergies as of 08/22/2019   No Known Allergies     Medication List    STOP taking these medications   ondansetron 4 MG tablet Commonly known as: ZOFRAN     TAKE these medications   amoxicillin-clavulanate 875-125 MG tablet Commonly known as: AUGMENTIN Take 1 tablet by mouth every 12 (twelve) hours for 5 days.   cyanocobalamin 1000 MCG/ML injection Commonly known as: (VITAMIN B-12) Inject 1,000 mcg into the muscle every 30 (thirty) days.   furosemide 20 MG tablet Commonly known as: LASIX TAKE 2 TABLETS BY MOUTH EVERY DAY   hydrOXYzine 10 MG tablet Commonly known as: ATARAX/VISTARIL TAKE 1 TABLET (10 MG TOTAL) BY MOUTH 3 (THREE) TIMES DAILY AS NEEDED FOR ITCHING.   lactulose 10 GM/15ML solution  Commonly known as: CHRONULAC TAKE 30 MLS (20 G TOTAL) BY MOUTH 2 (TWO) TIMES DAILY. What changed:   how much to take  how to take this  when to take this  additional instructions   metFORMIN 500 MG tablet Commonly known as: GLUCOPHAGE Take 1,000 mg by mouth 2 (two) times daily with a meal.   nystatin powder Commonly known as: MYCOSTATIN/NYSTOP Apply 1 g topically as needed (rash).   pantoprazole 40 MG tablet Commonly known as: PROTONIX Take 40 mg by mouth 2 (two) times daily.   potassium chloride 10 MEQ tablet Commonly known as: Klor-Con 10 Take 1 tablet (10  mEq total) by mouth daily for 4 days.   rifaximin 550 MG Tabs tablet Commonly known as: Xifaxan TAKE 1 TABLET (550 MG TOTAL) BY MOUTH 2 (TWO) TIMES DAILY. What changed:   how much to take  how to take this  when to take this  additional instructions   spironolactone 50 MG tablet Commonly known as: ALDACTONE TAKE 1 TABLET BY MOUTH EVERY DAY   traMADol 50 MG tablet Commonly known as: ULTRAM Take 50 mg by mouth every 4 (four) hours as needed for moderate pain.   Vitamin D (Ergocalciferol) 1.25 MG (50000 UT) Caps capsule Commonly known as: DRISDOL TAKE 1 CAPSULE BY MOUTH ONE TIME PER WEEK What changed: See the new instructions.       Today  Patient seen and evaluated today No confusion Tolerating diet well Hemodynamically stable VITAL SIGNS:  Blood pressure 101/62, pulse 68, temperature 97.9 F (36.6 C), temperature source Oral, resp. rate 16, height 5' 3" (1.6 m), weight 70.9 kg, SpO2 100 %.  I/O:    Intake/Output Summary (Last 24 hours) at 08/22/2019 1306 Last data filed at 08/22/2019 0900 Gross per 24 hour  Intake 240 ml  Output 200 ml  Net 40 ml    PHYSICAL EXAMINATION:  Physical Exam  GENERAL:  68 y.o.-year-old patient lying in the bed with no acute distress.  LUNGS: Normal breath sounds bilaterally, no wheezing, rales,rhonchi or crepitation. No use of accessory muscles of respiration.  CARDIOVASCULAR: S1, S2 normal. No murmurs, rubs, or gallops.  ABDOMEN: Soft, non-tender, non-distended. Bowel sounds present. No organomegaly or mass.  NEUROLOGIC: Moves all 4 extremities. PSYCHIATRIC: The patient is alert and oriented x 3.  SKIN: Has pustules over the lower extremity skin in right leg  DATA REVIEW:   CBC Recent Labs  Lab 08/21/19 0439  WBC 3.6*  HGB 12.4  HCT 36.6  PLT 111*    Chemistries  Recent Labs  Lab 08/20/19 1533 08/21/19 0439 08/22/19 0429  NA 136 138  --   K 3.8 2.9* 3.7  CL 102 104  --   CO2 16* 24  --   GLUCOSE 166* 75  --    BUN 17 13  --   CREATININE 1.35* 0.98  --   CALCIUM 9.9 9.2  --   MG  --  1.8  --   AST 60*  --   --   ALT 24  --   --   ALKPHOS 134*  --   --   BILITOT 2.3*  --   --     Cardiac Enzymes No results for input(s): TROPONINI in the last 168 hours.  Microbiology Results  Results for orders placed or performed during the hospital encounter of 08/20/19  SARS CORONAVIRUS 2 (TAT 6-24 HRS) Nasopharyngeal Nasopharyngeal Swab     Status: None   Collection Time: 08/20/19  6:09  PM   Specimen: Nasopharyngeal Swab  Result Value Ref Range Status   SARS Coronavirus 2 NEGATIVE NEGATIVE Final    Comment: (NOTE) SARS-CoV-2 target nucleic acids are NOT DETECTED. The SARS-CoV-2 RNA is generally detectable in upper and lower respiratory specimens during the acute phase of infection. Negative results do not preclude SARS-CoV-2 infection, do not rule out co-infections with other pathogens, and should not be used as the sole basis for treatment or other patient management decisions. Negative results must be combined with clinical observations, patient history, and epidemiological information. The expected result is Negative. Fact Sheet for Patients: SugarRoll.be Fact Sheet for Healthcare Providers: https://www.woods-mathews.com/ This test is not yet approved or cleared by the [place] FDA and  has been authorized for detection and/or diagnosis of SARS-CoV-2 by FDA under an Emergency Use Authorization (EUA). This EUA will remain  in effect (meaning this test can be used) for the duration of the COVID-19 declaration under Section 56 4(b)(1) of the Act, 21 U.S.C. section 360bbb-3(b)(1), unless the authorization is terminated or revoked sooner. Performed at [facility], [place] [address] [place], [place] [address]     RADIOLOGY:  Ct Head Wo Contrast  Result Date: 08/20/2019 CLINICAL DATA:  Liver stent placement.  Feeling funny. EXAM: CT HEAD  WITHOUT CONTRAST TECHNIQUE: Contiguous axial images were obtained from the base of the skull through the vertex without intravenous contrast. COMPARISON:  None. FINDINGS: Brain: No evidence of acute infarction, hemorrhage, hydrocephalus, extra-axial collection or mass lesion/mass effect. Vascular: Intracranial atherosclerotic disease. Skull: No osseous abnormality. Sinuses/Orbits: Visualized paranasal sinuses are clear. Visualized mastoid sinuses are clear. Visualized orbits demonstrate no focal abnormality. Other: None IMPRESSION: No acute intracranial pathology. Electronically Signed   By: Kathreen Devoid   On: 08/20/2019 16:17   Mr Brain Wo Contrast  Result Date: 08/20/2019 CLINICAL DATA:  Altered mental status (AMS), unclear cause. 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 Memory loss ongoing for 3 days. Patient reports feeling very unstable when walking. EXAM: MRI HEAD WITHOUT CONTRAST TECHNIQUE: Multiplanar, multiecho pulse sequences of the brain and surrounding structures were obtained without intravenous contrast. COMPARISON:  Head CT performed earlier the same day 08/20/2019. FINDINGS: Brain: There is no convincing evidence of acute infarct. No evidence of intracranial mass. No midline shift or extra-axial fluid collection. No chronic intracranial blood products. There is symmetric FLAIR hyperintensity of the bilateral mamillary bodies. Minimal scattered and ill-defined T2/FLAIR hyperintensity within the cerebral white matter is nonspecific, but consistent with chronic small vessel ischemic disease. Cerebral volume is normal for age. Vascular: Flow voids maintained within the proximal large arterial vessels. Skull and upper cervical spine: No focal marrow lesion. Sinuses/Orbits: Visualized orbits demonstrate no acute abnormality. Susceptibility artifact limits evaluation of the maxillary sinuses. Mild paranasal sinus mucosal  thickening. No significant mastoid effusion. IMPRESSION: 1. Symmetric FLAIR hyperintensity within the bilateral mamillary bodies. This finding may be seen in the setting of Wernicke encephalopathy, and clinical correlation is recommended. 2. No evidence of acute infarct. 3. Mild chronic small vessel ischemic disease. Electronically Signed   By: Kellie Simmering   On: 08/20/2019 19:55   US Abdomen Limited  Result Date: 08/21/2019 CLINICAL DATA:  Abdominal distension and possible ascites EXAM: LIMITED ABDOMEN ULTRASOUND FOR ASCITES TECHNIQUE: Limited ultrasound survey for ascites was performed in all four abdominal quadrants. COMPARISON:  None. FINDINGS: Ultrasound evaluation in all 4 quadrants reveals no significant ascites to allow for safe paracentesis. IMPRESSION: No  ascites is identified.  Paracentesis was not performed. Electronically Signed   By: Inez Catalina M.D.   On: 08/21/2019 11:36    Follow up with PCP in 1 week.  Management plans discussed with the patient, family and they are in agreement.  CODE STATUS: Full code    Code Status Orders  (From admission, onward)         Start     Ordered   08/20/19 1814  Full code  Continuous     08/20/19 1816        Code Status History    Date Active Date Inactive Code Status Order ID Comments User Context   08/03/2018 1616 08/10/2018 1758 Full Code [id number]  Jesus Genera, MD Inpatient   08/03/2018 0134 08/03/2018 1348 Full Code [id number]  Lance Coon, MD ED   02/09/2018 1220 02/12/2018 2050 Full Code [id number]  Gladstone Lighter, MD Inpatient   Advance Care Planning Activity      TOTAL TIME TAKING CARE OF THIS PATIENT ON DAY OF DISCHARGE: more than 34 minutes.   Saundra Shelling M.D on 08/22/2019 at 1:06 PM  Between 7am to 6pm - Pager - [phone]  After 6pm go to [email or web address] - password [email or web address] [facility]  Office  [phone]  CC: Primary care physician; Lorelee Market, MD  Note: This dictation was  prepared with Dragon dictation along with smaller phrase technology. Any transcriptional errors that result from this process are unintentional.

## 2019-08-22 NOTE — Evaluation (Signed)
Physical Therapy Evaluation Patient Details Name: Crystal [name] MRN: [id number] DOB: 1951-08-17 Today's Date: 08/22/2019   History of Present Illness  Per MD H&P: Pt is a 68 y.o. 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 Marius Ditch comes in because of memory loss as going on for 3 days.  Patient states that she feels very unstable when she walks and also has memory problem for last 3 days unable to remember the staff..  Patient initially work-up was done to will see if she has any elevated ammonia but patient blood work did not show any hepatic encephalopathy.  Patient blood work is largely normal, GI recommends admission for stroke work-up including MRI of the brain, paracentesis tomorrow to evaluate for any possible SBP.  MD assessment includes: Acute hypokalemia, Cirrhosis of liver, and DM II.    Clinical Impression  Pt presented without deficits in strength, transfers, mobility, gait, balance, or activity tolerance and reports feeling close to her baseline.  Pt was Ind with all functional mobility tasks including ambulating 200' without an AD with head turns and start/stops as well as ascending and descending 4 steps with one rail.  Pt's HR WNL during the session with no adverse symptoms reported by the pt.  Will complete PT orders at this time but will reassess pt pending a change in status upon receipt of new PT orders.      Follow Up Recommendations No PT follow up    Equipment Recommendations  None recommended by PT    Recommendations for Other Services       Precautions / Restrictions Precautions Precautions: None Restrictions Weight Bearing Restrictions: No      Mobility  Bed Mobility Overal bed mobility: Independent                Transfers Overall transfer level: Independent               General transfer comment: Good eccentric and concentric control and  stability  Ambulation/Gait Ambulation/Gait assistance: Independent Gait Distance (Feet): 200 Feet Assistive device: None   Gait velocity: decreased   General Gait Details: Mildy reduced cadence but steady throughout including during start/stops and with head turns  Stairs Stairs: Yes Stairs assistance: Independent Stair Management: One rail Left Number of Stairs: 4 General stair comments: Pt steady with good control ascending and descending 4 steps with one rail  Wheelchair Mobility    Modified Rankin (Stroke Patients Only)       Balance Overall balance assessment: No apparent balance deficits (not formally assessed)                                           Pertinent Vitals/Pain Pain Assessment: No/denies pain    Home Living Family/patient expects to be discharged to:: Private residence Living Arrangements: Non-relatives/Friends Available Help at Discharge: Friend(s);Available 24 hours/day Type of Home: House Home Access: Stairs to enter;Ramped entrance Entrance Stairs-Rails: Right;Left(Too wide for both) Entrance Stairs-Number of Steps: 3 Home Layout: One level Home Equipment: None      Prior Function Level of Independence: Independent         Comments: Ind Amb community distances without an AD, no fall history, Ind with ADLs     Hand Dominance        Extremity/Trunk Assessment   Upper Extremity Assessment Upper  Extremity Assessment: Overall WFL for tasks assessed    Lower Extremity Assessment Lower Extremity Assessment: Overall WFL for tasks assessed       Communication   Communication: No difficulties  Cognition Arousal/Alertness: Awake/alert Behavior During Therapy: WFL for tasks assessed/performed Overall Cognitive Status: Within Functional Limits for tasks assessed                                 General Comments: Pt A&O x 4      General Comments      Exercises     Assessment/Plan    PT  Assessment Patent does not need any further PT services  PT Problem List         PT Treatment Interventions      PT Goals (Current goals can be found in the Care Plan section)  Acute Rehab PT Goals PT Goal Formulation: All assessment and education complete, DC therapy    Frequency     Barriers to discharge        Co-evaluation               AM-PAC PT "6 Clicks" Mobility  Outcome Measure Help needed turning from your back to your side while in a flat bed without using bedrails?: None Help needed moving from lying on your back to sitting on the side of a flat bed without using bedrails?: None Help needed moving to and from a bed to a chair (including a wheelchair)?: None Help needed standing up from a chair using your arms (e.g., wheelchair or bedside chair)?: None Help needed to walk in hospital room?: None Help needed climbing 3-5 steps with a railing? : None 6 Click Score: 24    End of Session Equipment Utilized During Treatment: Gait belt Activity Tolerance: Patient tolerated treatment well Patient left: in bed;with call bell/phone within reach Nurse Communication: Mobility status PT Visit Diagnosis: Muscle weakness (generalized) (M62.81)    Time: 3419-6222 PT Time Calculation (min) (ACUTE ONLY): 21 min   Charges:   PT Evaluation $PT Eval Low Complexity: 1 Low          D. Scott Jash Wahlen PT, DPT 08/22/19, 11:10 AM

## 2019-08-25 ENCOUNTER — Telehealth: Payer: Self-pay | Admitting: Gastroenterology

## 2019-08-25 ENCOUNTER — Other Ambulatory Visit: Payer: Self-pay | Admitting: Gastroenterology

## 2019-08-25 NOTE — Telephone Encounter (Signed)
Patient called & has requested refills on lactulose 53m 167msolution called into [facility] [place] .She also wanted Dr VaMarius Ditcho know she was in the hospital last week because her potassium was low.

## 2019-08-26 NOTE — Telephone Encounter (Signed)
Medication has been sent to pharmacy, pt has been notified

## 2019-08-29 ENCOUNTER — Other Ambulatory Visit: Payer: Self-pay

## 2019-08-29 DIAGNOSIS — E669 Obesity, unspecified: Secondary | ICD-10-CM | POA: Diagnosis not present

## 2019-08-29 DIAGNOSIS — E119 Type 2 diabetes mellitus without complications: Secondary | ICD-10-CM | POA: Diagnosis not present

## 2019-08-29 DIAGNOSIS — D649 Anemia, unspecified: Secondary | ICD-10-CM | POA: Diagnosis not present

## 2019-08-29 DIAGNOSIS — K922 Gastrointestinal hemorrhage, unspecified: Secondary | ICD-10-CM | POA: Diagnosis not present

## 2019-09-01 ENCOUNTER — Other Ambulatory Visit: Payer: Self-pay

## 2019-09-01 ENCOUNTER — Inpatient Hospital Stay: Payer: Medicare HMO | Attending: Hematology and Oncology

## 2019-09-01 VITALS — BP 116/65 | HR 75 | Temp 97.9°F | Resp 16

## 2019-09-01 DIAGNOSIS — E538 Deficiency of other specified B group vitamins: Secondary | ICD-10-CM | POA: Insufficient documentation

## 2019-09-01 DIAGNOSIS — R69 Illness, unspecified: Secondary | ICD-10-CM | POA: Diagnosis not present

## 2019-09-01 MED ORDER — CYANOCOBALAMIN 1000 MCG/ML IJ SOLN
1000.0000 ug | Freq: Once | INTRAMUSCULAR | Status: AC
  Administered 2019-09-01: 1000 ug via INTRAMUSCULAR

## 2019-09-01 NOTE — Patient Instructions (Signed)

## 2019-09-09 ENCOUNTER — Other Ambulatory Visit: Payer: Self-pay | Admitting: Gastroenterology

## 2019-09-09 NOTE — Telephone Encounter (Signed)
pATIENT calling & would like a refill on her lactulose 10gm 61m [facility]

## 2019-09-12 ENCOUNTER — Telehealth: Payer: Self-pay | Admitting: Gastroenterology

## 2019-09-12 DIAGNOSIS — E669 Obesity, unspecified: Secondary | ICD-10-CM | POA: Diagnosis not present

## 2019-09-12 DIAGNOSIS — Z1389 Encounter for screening for other disorder: Secondary | ICD-10-CM | POA: Diagnosis not present

## 2019-09-12 DIAGNOSIS — E7800 Pure hypercholesterolemia, unspecified: Secondary | ICD-10-CM | POA: Diagnosis not present

## 2019-09-12 DIAGNOSIS — D649 Anemia, unspecified: Secondary | ICD-10-CM | POA: Diagnosis not present

## 2019-09-12 DIAGNOSIS — E119 Type 2 diabetes mellitus without complications: Secondary | ICD-10-CM | POA: Diagnosis not present

## 2019-09-12 DIAGNOSIS — J449 Chronic obstructive pulmonary disease, unspecified: Secondary | ICD-10-CM | POA: Diagnosis not present

## 2019-09-12 NOTE — Telephone Encounter (Signed)
Nurse Practitioner Drema Balzarine left vm to receive a call from Dr. Marius Ditch regarding pt her office number is [phone] or cell [phone]

## 2019-09-15 NOTE — Telephone Encounter (Signed)
Talk to patient's nurse practitioner, Gordy Clement about her recent hypokalemia.  Repeat potassium was normal.  Her PCP rechecked her potassium, waiting to review the results tomorrow.  She has expressed concern that patient is going to pharmacy to pick up lactulose every week for refills.  I checked her prescription, it is actually written for 3 months  Asked her PCP to send me a copy of the most recent results  I will see her for follow-up on 10/14/2019  [name], MD [address]  [place]  [place], [place] [address]  Main: [phone]  Fax: [phone] Pager: [phone]

## 2019-09-18 ENCOUNTER — Encounter: Payer: Self-pay | Admitting: Family Medicine

## 2019-09-19 ENCOUNTER — Other Ambulatory Visit: Payer: Self-pay | Admitting: Gastroenterology

## 2019-09-22 DIAGNOSIS — D500 Iron deficiency anemia secondary to blood loss (chronic): Secondary | ICD-10-CM | POA: Diagnosis not present

## 2019-09-23 ENCOUNTER — Encounter: Payer: Self-pay | Admitting: Hematology and Oncology

## 2019-09-25 ENCOUNTER — Other Ambulatory Visit: Payer: Self-pay | Admitting: Gastroenterology

## 2019-09-25 DIAGNOSIS — E119 Type 2 diabetes mellitus without complications: Secondary | ICD-10-CM | POA: Diagnosis not present

## 2019-09-25 DIAGNOSIS — Z7984 Long term (current) use of oral hypoglycemic drugs: Secondary | ICD-10-CM | POA: Diagnosis not present

## 2019-09-25 DIAGNOSIS — K7460 Unspecified cirrhosis of liver: Secondary | ICD-10-CM

## 2019-09-25 DIAGNOSIS — K7290 Hepatic failure, unspecified without coma: Secondary | ICD-10-CM

## 2019-09-25 DIAGNOSIS — H43811 Vitreous degeneration, right eye: Secondary | ICD-10-CM | POA: Diagnosis not present

## 2019-09-25 DIAGNOSIS — Z961 Presence of intraocular lens: Secondary | ICD-10-CM | POA: Diagnosis not present

## 2019-09-25 DIAGNOSIS — H04123 Dry eye syndrome of bilateral lacrimal glands: Secondary | ICD-10-CM | POA: Diagnosis not present

## 2019-09-25 MED ORDER — LACTULOSE 10 GM/15ML PO SOLN: 20.0000 g | Freq: Two times a day (BID) | ORAL | 2 refills | Status: AC

## 2019-09-26 ENCOUNTER — Other Ambulatory Visit: Payer: Self-pay

## 2019-09-26 ENCOUNTER — Encounter: Payer: Self-pay | Admitting: Hematology and Oncology

## 2019-09-26 NOTE — Progress Notes (Signed)
No new changes noted today. The patient Name and DOB has been verified by phone today.

## 2019-09-27 NOTE — Progress Notes (Signed)
[facility]  [address], [address] [place], [place] [address] Phone: [phone]  Fax: [phone]   Clinic Day:  09/29/2019  Referring physician: Lorelee Market, MD  Chief Complaint: Crystal [name] is a 68 y.o. female with tworight sided pulmonary nodules and lymphadenopathy who is seen for 3 month assessment.   HPI: The patient was last seen in the medical oncology clinic on 07/07/2019. At that time, she was feeling better s/p completion of antibiotics for her right index finger.  She wished to continue surveillance.  She received B12 injections monthly (07/07/2019 - 09/01/2019).  She was seen for follow up of pain in right finger by Roseanne Kaufman on 07/17/2019. She was told to wear finger protection and return in 4 weeks for a follow up and x-rays.  Chest CT without contrast on 08/07/2019 revealed no significant interval change in size right middle lobe and right lower lobe lung nodules. Liver was compatible with cirrhosis s/p TIPS.  She was admitted to [name] from 08/20/2019 to 08/22/2019 with altered mental status. Head CT revealed no acute intracranial pathology. Head MRI wo contrast showed symmetric FLAIR hyperintensity within the bilateral mamillary bodies. This finding may be seen in the setting of Wernicke encephalopathy.  There was no evidence of acute infarct and mild chronic small vessels with ischemic disease.  Ultrasound of the abdomen showed no ascites.  She received Augmentin for folliculitis of the lower extremity.  Her mental status returned to baseline. She have given potassium and discharged. She was told to follow up with PCP  CBC followed: 08/20/2019: Hematocrit 40.6, hemoglobin 12.9, MCV 88.5, platelets 118,000, WBC 4300, ANC 2800. Creatinine 1.35.  Albumin 3.2. Ammonia 30. 08/21/2019: Hematocrit 36.4, hemoglobin 12.4, MCV 83.2, platelets 111,000, WBC 3600. 09/12/2019: Hematocrit 34.0, hemoglobin 11.6, MCV 86.0, platelets 124,000, WBC  3500. 09/22/2019: Hematocrit 31.5, hemoglobin 10.6, MCV 84.0, platelets 116,000, WBC 3400, ANC 2200.  Ferritin was 41 with an iron saturation of 34% and a TIBC of 192.  During the interim, her finger still hurts. She believes it has to due with the cold weather.  She just feels "ok". She doesn't want to follow up on her ovarian cyst.   Past Medical History:  Diagnosis Date  . Acute GI bleeding 02/09/2018  . Acute upper gastrointestinal bleeding 08/03/2018  . Anemia    TAKES IRON TAB  . Arthritis    SHOULDER  . B12 deficiency 02/18/2018  . Bleeding    GI  3/19  . Bronchitis    HX OF  . Cirrhosis ([place])   . Colon cancer screening   . Diabetes mellitus without complication ([place])    TYPE 2  . Fatty (change of) liver, not elsewhere classified   . Full dentures    UPPER AND LOWER  . Iron deficiency anemia due to chronic blood loss 02/18/2018  . Lung nodule, multiple   . Pernicious anemia 02/22/2018  . PUD (peptic ulcer disease)   . Raynaud's syndrome   . Upper GI bleeding 08/03/2018    Past Surgical History:  Procedure Laterality Date  . CATARACT EXTRACTION W/PHACO Right 02/06/2018   Procedure: CATARACT EXTRACTION PHACO AND INTRAOCULAR LENS PLACEMENT ([place]) RIGHT DIABETIC;  Surgeon: Leandrew Koyanagi, MD;  Location: [place];  Service: Ophthalmology;  Laterality: Right;  DIABETIC, ORAL MED  . CATARACT EXTRACTION W/PHACO Left 04/30/2018   Procedure: CATARACT EXTRACTION PHACO AND INTRAOCULAR LENS PLACEMENT (I[facility]);  Surgeon: Leandrew Koyanagi, MD;  Location: [facility] ORS;  Service: Ophthalmology;  Laterality: Left;  [place] 01:04 AP% 21.3 CDE 13.66 Fluid pack lot # [id number]  . COLONOSCOPY WITH PROPOFOL N/A 03/25/2018   Procedure: COLONOSCOPY WITH PROPOFOL;  Surgeon: Lin Landsman, MD;  Location: [facility] ENDOSCOPY;  Service: Gastroenterology;  Laterality: N/A;  . DENTAL SURGERY     EXTRACTIONS  . ELECTROMAGNETIC NAVIGATION BROCHOSCOPY Right 01/27/2019   Procedure: ELECTROMAGNETIC  NAVIGATION BRONCHOSCOPY;  Surgeon: [name], MD;  Location: [facility] ORS;  Service: Cardiopulmonary;  Laterality: Right;  . ESOPHAGOGASTRODUODENOSCOPY N/A 08/03/2018   Procedure: ESOPHAGOGASTRODUODENOSCOPY (EGD);  Surgeon: Lin Landsman, MD;  Location: [facility] ENDOSCOPY;  Service: Gastroenterology;  Laterality: N/A;  . ESOPHAGOGASTRODUODENOSCOPY (EGD) WITH PROPOFOL N/A 02/10/2018   Procedure: ESOPHAGOGASTRODUODENOSCOPY (EGD) WITH PROPOFOL;  Surgeon: Jonathon Bellows, MD;  Location: [facility] ENDOSCOPY;  Service: Gastroenterology;  Laterality: N/A;  . ESOPHAGOGASTRODUODENOSCOPY (EGD) WITH PROPOFOL N/A 03/25/2018   Procedure: ESOPHAGOGASTRODUODENOSCOPY (EGD) WITH PROPOFOL;  Surgeon: Lin Landsman, MD;  Location: [facility] ENDOSCOPY;  Service: Gastroenterology;  Laterality: N/A;  . ESOPHAGOGASTRODUODENOSCOPY (EGD) WITH PROPOFOL N/A 04/22/2018   Procedure: ESOPHAGOGASTRODUODENOSCOPY (EGD) WITH PROPOFOL with band ligation;  Surgeon: Lin Landsman, MD;  Location: [place];  Service: Gastroenterology;  Laterality: N/A;  . ESOPHAGOGASTRODUODENOSCOPY (EGD) WITH PROPOFOL N/A 06/24/2018   Procedure: ESOPHAGOGASTRODUODENOSCOPY (EGD) WITH PROPOFOL;  Surgeon: Lin Landsman, MD;  Location: [facility] ENDOSCOPY;  Service: Gastroenterology;  Laterality: N/A;  . ESOPHAGOGASTRODUODENOSCOPY (EGD) WITH PROPOFOL N/A 11/19/2018   Procedure: ESOPHAGOGASTRODUODENOSCOPY (EGD) WITH PROPOFOL;  Surgeon: Lin Landsman, MD;  Location: [facility] ENDOSCOPY;  Service: Gastroenterology;  Laterality: N/A;  . EYE SURGERY    . HEMORRHOID BANDING  03/25/2018   Procedure: HEMORRHOID BANDING;  Surgeon: Lin Landsman, MD;  Location: [facility] ENDOSCOPY;  Service: Gastroenterology;;  . IR EMBO ART  VEN HEMORR LYMPH EXTRAV  INC GUIDE ROADMAPPING  08/07/2018  . IR RADIOLOGIST EVAL & MGMT  09/03/2018  . IR RADIOLOGIST EVAL & MGMT  12/17/2018  . IR RADIOLOGIST EVAL & MGMT  06/24/2019  . IR TIPS  08/07/2018  . RADIOLOGY WITH ANESTHESIA N/A  08/07/2018   Procedure: TIPS;  Surgeon: Corrie Mckusick, DO;  Location: [place];  Service: Anesthesiology;  Laterality: N/A;  . TONSILLECTOMY      Family History  Problem Relation Age of Onset  . CAD Father   . Heart attack Father 59  . Cancer Maternal Uncle   . Seizures Mother     Social History:  reports that she has never smoked. She has never used smokeless tobacco. She reports previous alcohol use. She reports that she does not use drugs. She retired from [employer] in 11/2017. She lives in [place].  The patient is alone today.  Allergies: No Known Allergies  Current Medications: Current Outpatient Medications  Medication Sig Dispense Refill  . cyanocobalamin (,VITAMIN B-12,) 1000 MCG/ML injection Inject 1,000 mcg into the muscle every 30 (thirty) days.     . furosemide (LASIX) 20 MG tablet TAKE 2 TABLETS BY MOUTH EVERY DAY (Patient taking differently: Take 40 mg by mouth daily. ) 180 tablet 1  . hydrOXYzine (ATARAX/VISTARIL) 10 MG tablet TAKE 1 TABLET (10 MG TOTAL) BY MOUTH 3 (THREE) TIMES DAILY AS NEEDED FOR ITCHING. 30 tablet 0  . lactulose (CHRONULAC) 10 GM/15ML solution Take 30 mLs (20 g total) by mouth 2 (two) times daily. 1800 mL 2  . metFORMIN (GLUCOPHAGE) 500 MG tablet Take 1,000 mg by mouth 2 (two) times daily with a meal.     . pantoprazole (PROTONIX) 40 MG tablet Take 40 mg by mouth  2 (two) times daily.    . potassium chloride (KLOR-CON 10) 10 MEQ tablet Take 1 tablet (10 mEq total) by mouth daily for 4 days. 4 tablet 0  . rifaximin (XIFAXAN) 550 MG TABS tablet TAKE 1 TABLET (550 MG TOTAL) BY MOUTH 2 (TWO) TIMES DAILY. (Patient taking differently: Take 550 mg by mouth 2 (two) times daily. ) 60 tablet 5  . spironolactone (ALDACTONE) 50 MG tablet TAKE 1 TABLET BY MOUTH EVERY DAY (Patient taking differently: Take 50 mg by mouth daily. ) 90 tablet 1  . Vitamin D, Ergocalciferol, (DRISDOL) 1.25 MG (50000 UT) CAPS capsule TAKE 1 CAPSULE BY MOUTH ONE TIME PER WEEK (Patient taking  differently: Take 50,000 Units by mouth once a week. ) 12 capsule 0  . fluconazole (DIFLUCAN) 150 MG tablet fluconazole 150 mg tablet  TAKE 1 TABLET NOW, IF NO IMPROVEMENT REPEAT IN 1 WEEK    . nystatin (MYCOSTATIN/NYSTOP) powder Apply 1 g topically as needed (rash).     . ondansetron (ZOFRAN) 4 MG tablet ondansetron HCl 4 mg tablet  TAKE 1 TABLET BY MOUTH EVERY 6 HOURS AS NEEDED    . traMADol (ULTRAM) 50 MG tablet Take 50 mg by mouth every 4 (four) hours as needed for moderate pain.      No current facility-administered medications for this visit.     Review of Systems  Constitutional: Negative.  Negative for chills, diaphoresis, fever, malaise/fatigue and weight loss (up 9 lbs).       Feels "ok".  HENT: Negative.  Negative for congestion, ear pain, nosebleeds, sinus pain and sore throat.   Eyes: Negative.  Negative for blurred vision, double vision, photophobia and pain.  Respiratory: Negative.  Negative for cough, hemoptysis, sputum production and shortness of breath.   Cardiovascular: Negative.  Negative for chest pain, palpitations, orthopnea, leg swelling and PND.  Gastrointestinal: Positive for diarrhea (lactulose causes loose stools). Negative for abdominal pain, blood in stool, constipation, heartburn, melena, nausea and vomiting.       Portal hypertensive gastropathy s/p variceal ligation.  Appetite affected by antibiotics.  Genitourinary: Negative.  Negative for dysuria, frequency, hematuria and urgency.  Musculoskeletal: Positive for back pain. Negative for falls, joint pain, myalgias and neck pain.  Skin: Positive for itching (back at night ; lower abdominal itch gone). Negative for rash.  Neurological: Negative.  Negative for dizziness, tremors, sensory change, speech change, focal weakness, seizures, weakness and headaches.  Endo/Heme/Allergies: Does not bruise/bleed easily.       Diabetes on Metformin.  Psychiatric/Behavioral: Negative.  Negative for depression and memory  loss. The patient is not nervous/anxious and does not have insomnia.   All other systems reviewed and are negative.  Performance status (ECOG):  2  Vitals Blood pressure (!) 105/52, pulse 70, temperature 97.7 F (36.5 C), temperature source Tympanic, resp. rate 18, height 5' 3" (1.6 m), weight 171 lb 10.1 oz (77.9 kg).   Physical Exam  Constitutional: She is oriented to person, place, and time. She appears well-developed and well-nourished. No distress.  HENT:  Head: Normocephalic and atraumatic.  Mouth/Throat: No oropharyngeal exudate.  Shoulder length gray hair.  Dry mouth.  Mask.  Eyes: Pupils are equal, round, and reactive to light. Conjunctivae and EOM are normal. No scleral icterus.  Glasses. Blue eyes.  Neck: Normal range of motion. Neck supple. No JVD present.  Cardiovascular: Normal rate, regular rhythm and normal heart sounds.  No murmur heard. Pulmonary/Chest: Effort normal and breath sounds normal. No respiratory distress. She has  no wheezes. She has no rales.  Abdominal: Soft. Bowel sounds are normal. She exhibits no distension. There is no abdominal tenderness. There is no rebound and no guarding.  LUQ full.  Musculoskeletal: Normal range of motion.        General: Edema (chronic 2+ BLE) present.     Comments: Right index finger wrapped.  No increased warmth or swelling of exposed finger.  Lymphadenopathy:    She has no cervical adenopathy.    She has no axillary adenopathy.       Right: No supraclavicular adenopathy present.       Left: No supraclavicular adenopathy present.  Neurological: She is alert and oriented to person, place, and time.  Skin: Skin is warm and dry. No rash noted. She is not diaphoretic. No erythema. No pallor.  Telangectasias lip and face.  Spider angiomas.  Psychiatric: She has a normal mood and affect. Her behavior is normal. Judgment and thought content normal.  Nursing note and vitals reviewed.   No visits with results within 3 Day(s)  from this visit.  Latest known visit with results is:  Admission on 08/20/2019, Discharged on 08/22/2019  Component Date Value Ref Range Status  . WBC 08/20/2019 4.3  4.0 - 10.5 K/uL Final  . RBC 08/20/2019 4.59  3.87 - 5.11 MIL/uL Final  . Hemoglobin 08/20/2019 12.9  12.0 - 15.0 g/dL Final  . HCT 08/20/2019 40.6  36.0 - 46.0 % Final  . MCV 08/20/2019 88.5  80.0 - 100.0 fL Final  . MCH 08/20/2019 28.1  26.0 - 34.0 pg Final  . MCHC 08/20/2019 31.8  30.0 - 36.0 g/dL Final  . RDW 08/20/2019 16.1* 11.5 - 15.5 % Final  . Platelets 08/20/2019 118* 150 - 400 K/uL Final   Comment: Immature Platelet Fraction may be clinically indicated, consider ordering this additional test [id number]   . nRBC 08/20/2019 0.0  0.0 - 0.2 % Final   Performed at [facility], [address] [place], [place] [address]  . Neutrophils Relative % 08/20/2019 64  % Final  . Neutro Abs 08/20/2019 2.8  1.7 - 7.7 K/uL Final  . Lymphocytes Relative 08/20/2019 25  % Final  . Lymphs Abs 08/20/2019 1.1  0.7 - 4.0 K/uL Final  . Monocytes Relative 08/20/2019 9  % Final  . Monocytes Absolute 08/20/2019 0.4  0.1 - 1.0 K/uL Final  . Eosinophils Relative 08/20/2019 1  % Final  . Eosinophils Absolute 08/20/2019 0.0  0.0 - 0.5 K/uL Final  . Basophils Relative 08/20/2019 1  % Final  . Basophils Absolute 08/20/2019 0.0  0.0 - 0.1 K/uL Final  . Immature Granulocytes 08/20/2019 0  % Final  . Abs Immature Granulocytes 08/20/2019 0.01  0.00 - 0.07 K/uL Final   Performed at [facility], [address], [place], [place] [address]  . Sodium 08/20/2019 136  135 - 145 mmol/L Final  . Potassium 08/20/2019 3.8  3.5 - 5.1 mmol/L Final  . Chloride 08/20/2019 102  98 - 111 mmol/L Final  . CO2 08/20/2019 16* 22 - 32 mmol/L Final  . Glucose, Bld 08/20/2019 166* 70 - 99 mg/dL Final  . BUN 08/20/2019 17  8 - 23 mg/dL Final  . Creatinine, Ser 08/20/2019 1.35* 0.44 - 1.00 mg/dL Final  . Calcium 08/20/2019 9.9  8.9 - 10.3 mg/dL  Final  . Total Protein 08/20/2019 7.0  6.5 - 8.1 g/dL Final  . Albumin 08/20/2019 3.2* 3.5 - 5.0 g/dL Final  . AST 08/20/2019  60* 15 - 41 U/L Final  . ALT 08/20/2019 24  0 - 44 U/L Final  . Alkaline Phosphatase 08/20/2019 134* 38 - 126 U/L Final  . Total Bilirubin 08/20/2019 2.3* 0.3 - 1.2 mg/dL Final  . GFR calc non Af Amer 08/20/2019 41* >60 mL/min Final  . GFR calc Af Amer 08/20/2019 47* >60 mL/min Final  . Anion gap 08/20/2019 18* 5 - 15 Final   Performed at [facility] [address], [place], [place] [address]  . Color, Urine 08/20/2019 YELLOW* YELLOW Final  . APPearance 08/20/2019 HAZY* CLEAR Final  . Specific Gravity, Urine 08/20/2019 1.012  1.005 - 1.030 Final  . pH 08/20/2019 5.0  5.0 - 8.0 Final  . Glucose, UA 08/20/2019 NEGATIVE  NEGATIVE mg/dL Final  . Hgb urine dipstick 08/20/2019 NEGATIVE  NEGATIVE Final  . Bilirubin Urine 08/20/2019 NEGATIVE  NEGATIVE Final  . Ketones, ur 08/20/2019 NEGATIVE  NEGATIVE mg/dL Final  . Protein, ur 08/20/2019 NEGATIVE  NEGATIVE mg/dL Final  . Nitrite 08/20/2019 NEGATIVE  NEGATIVE Final  . Leukocytes,Ua 08/20/2019 TRACE* NEGATIVE Final  . RBC / HPF 08/20/2019 0-5  0 - 5 RBC/hpf Final  . WBC, UA 08/20/2019 0-5  0 - 5 WBC/hpf Final  . Bacteria, UA 08/20/2019 RARE* NONE SEEN Final  . Squamous Epithelial / LPF 08/20/2019 6-10  0 - 5 Final  . Mucus 08/20/2019 PRESENT   Final  . Hyaline Casts, UA 08/20/2019 PRESENT   Final   Performed at [facility], [address], [place], [place] [address]  . Ammonia 08/20/2019 30  9 - 35 umol/L Final   Performed at [facility], [address] [place], [place] [address]  . SARS Coronavirus 2 08/20/2019 NEGATIVE  NEGATIVE Final   Comment: (NOTE) SARS-CoV-2 target nucleic acids are NOT DETECTED. The SARS-CoV-2 RNA is generally detectable in upper and lower respiratory specimens during the acute phase of infection. Negative results do not preclude SARS-CoV-2 infection, do  not rule out co-infections with other pathogens, and should not be used as the sole basis for treatment or other patient management decisions. Negative results must be combined with clinical observations, patient history, and epidemiological information. The expected result is Negative. Fact Sheet for Patients: SugarRoll.be Fact Sheet for Healthcare Providers: https://www.woods-mathews.com/ This test is not yet approved or cleared by the [place] FDA and  has been authorized for detection and/or diagnosis of SARS-CoV-2 by FDA under an Emergency Use Authorization (EUA). This EUA will remain  in effect (meaning this test can be used) for the duration of the COVID-19 declaration under Section 56                          4(b)(1) of the Act, 21 U.S.C. section 360bbb-3(b)(1), unless the authorization is terminated or revoked sooner. Performed at [facility], [address], [place], [place] [address]   . HIV Screen 4th Generation wRfx 08/21/2019 NON REACTIVE  NON REACTIVE Final   Performed at [facility], [place] [address] [place], [place] [address]  . Sodium 08/21/2019 138  135 - 145 mmol/L Final  . Potassium 08/21/2019 2.9* 3.5 - 5.1 mmol/L Final  . Chloride 08/21/2019 104  98 - 111 mmol/L Final  . CO2 08/21/2019 24  22 - 32 mmol/L Final  . Glucose, Bld 08/21/2019 75  70 - 99 mg/dL Final  . BUN 08/21/2019 13  8 - 23 mg/dL Final  . Creatinine, Ser  08/21/2019 0.98  0.44 - 1.00 mg/dL Final  . Calcium 08/21/2019 9.2  8.9 - 10.3 mg/dL Final  . GFR calc non Af Amer 08/21/2019 60* >60 mL/min Final  . GFR calc Af Amer 08/21/2019 >60  >60 mL/min Final  . Anion gap 08/21/2019 10  5 - 15 Final   Performed at [facility], [address] [place], [place] [address]  . WBC 08/21/2019 3.6* 4.0 - 10.5 K/uL Final  . RBC 08/21/2019 4.40  3.87 - 5.11 MIL/uL Final  . Hemoglobin 08/21/2019 12.4  12.0 - 15.0 g/dL Final  . HCT 08/21/2019  36.6  36.0 - 46.0 % Final  . MCV 08/21/2019 83.2  80.0 - 100.0 fL Final  . MCH 08/21/2019 28.2  26.0 - 34.0 pg Final  . MCHC 08/21/2019 33.9  30.0 - 36.0 g/dL Final  . RDW 08/21/2019 15.4  11.5 - 15.5 % Final  . Platelets 08/21/2019 111* 150 - 400 K/uL Final   Comment: Immature Platelet Fraction may be clinically indicated, consider ordering this additional test [id number]   . nRBC 08/21/2019 0.0  0.0 - 0.2 % Final   Performed at [facility], [address] [place], [place] [address]  . Hgb A1c MFr Bld 08/20/2019 5.2  4.8 - 5.6 % Final   Comment: (NOTE) Pre diabetes:          5.7%-6.4% Diabetes:              >6.4% Glycemic control for   <7.0% adults with diabetes   . Mean Plasma Glucose 08/20/2019 102.54  mg/dL Final   Performed at [facility] [address], [place], [place] [address]  . Glucose-Capillary 08/20/2019 58* 70 - 99 mg/dL Final  . Glucose-Capillary 08/20/2019 86  70 - 99 mg/dL Final  . Glucose-Capillary 08/21/2019 125* 70 - 99 mg/dL Final  . Glucose-Capillary 08/21/2019 61* 70 - 99 mg/dL Final  . Glucose-Capillary 08/21/2019 140* 70 - 99 mg/dL Final  . Magnesium 08/21/2019 1.8  1.7 - 2.4 mg/dL Final   Performed at [facility], [address] [place], [place] [address]  . Glucose-Capillary 08/21/2019 89  70 - 99 mg/dL Final  . Comment 1 08/21/2019 Notify RN   Final  . Glucose-Capillary 08/21/2019 96  70 - 99 mg/dL Final  . Comment 1 08/21/2019 Notify RN   Final  . Potassium 08/22/2019 3.7  3.5 - 5.1 mmol/L Final   Performed at [facility], [address] [place], [place] [address]  . Glucose-Capillary 08/21/2019 139* 70 - 99 mg/dL Final  . Comment 1 08/21/2019 Notify RN   Final  . Glucose-Capillary 08/21/2019 124* 70 - 99 mg/dL Final  . Glucose-Capillary 08/22/2019 103* 70 - 99 mg/dL Final  . Comment 1 08/22/2019 Notify RN   Final  . Glucose-Capillary 08/22/2019 149* 70 - 99 mg/dL Final  . Comment 1 08/22/2019 Notify RN    Final    Assessment:  Crystal [name] is a 68 y.o. female with a right lower lobe pulmonary nodule. She denies any smoking history.  Abdomen and pelvic CTon 02/09/2018 revealed an 1.8 cm spiculated density in right lower lobe concerning for malignancy. There was mildly nodular hepatic contourconcerning for hepatic cirrhosis. There was moderate splenomegalysuggesting portal venous hypertension. Mild ascites was noted. There were mildly enlarged retroperitoneal lymph nodes (largest 1.1 cm) concerning for possible metastatic disease or malignancy. There was a 7 cm left ovarian cyst. Further evaluation with MRI was recommended to evaluate for  possible neoplasm. CA125was 351.3 and CEA1.1 on 02/21/2018.   Chest CTon 02/25/2018 revealed a 2.3 x 1.5 x 1.0 cm perifissural nodulewith spiculated margins in the right middle lobe. There was a 1.5 x 1.5 x 1.4 cm right lower lobe spiculated nodule. There was a moderate left sided pleural effusion.   PET scanon 03/08/2018 revealed a 2.5 x 1.8 cm sub solid nodular opacity in theRML(SUV 2.7) and a 1.6 cm roundedslightly spiculated RLL pulmonary nodule(SUV 2.1). There were no enlarged or hypermetabolic mediastinal or hilar lymph nodes. There was a small left pleural effusion. 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  Chest CTon 05/15/2018 revealed no significant changes in the 2 previously noted nodules in the RIGHT lung. Irregular RML lesion measures 2.4 x 1.4 cm (previously 2.3 x 1.5 cm). Spiculated central RLL lesion measures 1.8 x 1.4 cm (previously 1.6 x 1.5 cm). Both areas remain concerning for neoplasm. There was interval decrease in ascites volume overall. Upper abdominal lymphadenopathy noted that was felt to be reactive due to underlying liver disease.   Chest CTon 08/15/2018 revealed the spiculated right lower lobe lung lesion appeared slightly  increased and was not significantly changed in size compared with previous exam. Morphologically, this was worrisome for a small bronchogenic neoplasm. The right middle lobe lung nodule was slightly decreased in size in the interval and may be post infectious or inflammatory in etiology.  Super D chest CTon 01/24/2019 revealed a 3.2 x 2.3 cm ill-defined anterior right lung nodule compared to 2.9 x 2.3 cm previously. Minor fissurewasnot well, and as such, this nodule can not be confidently localized to the right upper or middle lobe. The right lower lobe nodule measured2.0 x 2.0 cm comparedto 2.0 x 1.9 cm.  Chest CT on 08/07/2019 revealed no significant interval change in size right middle lobe and right lower lobe lung nodules. Liver was compatible with cirrhosis s/pTIPS.  She was admitted to [facility] from 02/09/2018 - 04/02/2019with a variceal hemorrhage. EGDon 02/10/2018 revealed 5 columns of grade III esophageal varices in the lower third of the esophagus. There was stigmata of recent bleeding and red wale signs. She underwent variceal ligation x 10. She required 3 units of PRBCs.  EGDon 03/25/2018 revealed 2 angiectasias(non-bleeding) in the second portion of the duodenum, and a few diminutive (non-bleeding) angiectasias in the prepyloric region of the stomach that were treated APC. There were 4 non-bleeding ulcers ([place] Class III) found in the gastric fundus. There were 4 columns of large non-bleeding varicesin the lower third of the esophagus, of which demonstrated no stigmata of bleeding. Varices were banded. EGD on 04/22/2018 revealed one non-bleeding cratered gastric ulcer([place] Class III) in the lesser curvature of the gastric body. Duodenal bulb and second portion of the duodenum were normal. Moderate portal hypertensive gastropathy noted in the stomach. Large varices in the lower third of the esophagus noted. 2 bands were placed with incomplete eradication of the  lesions.   Colonoscopyon 03/25/2018 revealed a single 5 mm sessile polyp in the ascending colon. Patient has rectal varices and external hemorrhoids. Pathology returned as tubular adenoma, and was negative for high grade dysplasia and malignancy.   She has iron deficiency anemia. Ferritinwas 6. She is on ferrous sulfate 325 mg TID. She hasB12 deficiency. B12was 103. Intrinsic factor antibody was negative. 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 She began B12 injections on 03/06/2018 (last 06/09/2019). Folate was26on 11/20/2018. B12 was 485 on 05/24/2018. Dietis good.  She received Venoferweekly x 4 (07/12/2018 - 07/31/2018).   Ferritinhas been followed: 6 on 02/09/2018, 16 on 05/24/2018, 13 on 07/10/2018, 374 on 08/15/2018, 485 on 08/21/2018, 114 on 09/25/2018, 63 on 11/20/2018, 35 on 02/12/2019, 22 on 04/24/2019, and 41 on 09/22/2019.  She has hepatitis C antibodies. HCV RNA was not detected. She has been exposed to hepatitis B. Anti-smooth muscle antibodiesare weakly positive. ANAwas positive (>1:1280 centromere antibody). Negative studiesincluded: ceruloplasmin, anti-mitochondrial antibodies, hepatitis B IgM, HIV, and alpha-1 antitrypsin. PT was 15.2 (INR 1.21). AFPwas 1.5 on 05/28/2018, 2.4 on 11/20/2018, and 2.0 on 04/24/2019.  She has a history of decompensated cirrhosiss/p TIPSprocedure 08/07/2018. She has portal hypertension and a history of recurrent bleeding esophageal varices.She was given a 2-year life expectancyfollowing her TIPS procedure.  Abdominal/pelvic arterial/venous ultrasound doppler on 04/24/2019 revealed a patent TIPS and no evidence of persisting ascites.  She was admitted to [facility] from 08/20/2019 to 08/22/2019 with altered mental status. Head CT revealed no acute intracranial pathology. Head MRI wo contrast showed symmetric FLAIR hyperintensity within the bilateral mamillary bodies. This finding may  be seen in the setting of Wernicke encephalopathy.  There was no evidence of acute infarct and mild chronic small vessels with ischemic disease.  Her mental status returned to baseline.  RUQ ultrasoundon 05/29/2018 revealed cholelithiasis. There was a thickened edematous gallbladder wall. The wall thickening could be due to hypoproteinemia/hypoalbuminemia or cholecystitis. Negative sonographic Murphy's sign. There was a heterogeneous slightly nodular liver consistent with cirrhosis.  Pelvic ultrasoundon 03/18/2018 demonstrated a simple cyst in the LEFT ovary measuring 6.1 x 4.5 x 6.6 cm. Repeat imaging recommended in 1 year to document stability.  Symptomatically, she is doing "ok".  She denies any bleeding.  Exam is stable.  Plan: 1.   Review labs from 09/22/2019. 2.   Decompensated cirrhosis s/p TIPS Clinically, she is doing fairly well. Interval admission with altered mental status reviewed. Life expectancy is limited s/p TIPS (2 years). She denies any bleeding. AFP was 2.0 on 04/24/2019. Continue close follow-up with GI. 3.   Pulmonary nodules             Chest CT on 08/07/2019 revealed no significant interval change in size RML and RLL lung nodules.   Continue to monitor. 4.   Iron deficiency anemia Hematocrit31.5. Hemoglobin 10.6. MCV 84.0on 09/22/2019. Ferritinwas 41 on 09/22/2019. Continue [facility] labs. 5.   B12 deficiency Patient receives B12 monthly (last 09/01/2019)  B12 today and monthly x6. 6.   Left ovarian cyst  Review pelvic ultrasound from 03/18/2018.  Patient declines follow-up. 7.   [facility] labs in 1 month. 8.   RTC in 3 months for MD assessment, [facility] labs (CBC with diff, CMP, ferritin, sed rate - few days before), B12 and +/- Venofer.  I discussed the assessment and treatment plan with the patient.  The patient was provided an opportunity to ask questions and all were answered.  The patient agreed with the plan and demonstrated an  understanding of the instructions.  The patient was advised to call back if the symptoms worsen or if the condition fails to improve as anticipated.  I provided 19 minutes (1:55 PM - 2:14 PM) of face-to-face time during this this encounter and > 50% was spent counseling as documented under my assessment and plan.    Lequita Asal, MD, PhD    09/29/2019, 2:14 PM  I, Samul Dada, am acting as a scribe for Lequita Asal, MD.  I, [employer] Mike Gip, MD, have reviewed  the above documentation for accuracy and completeness, and I agree with the above.

## 2019-09-29 ENCOUNTER — Inpatient Hospital Stay: Payer: Medicare HMO

## 2019-09-29 ENCOUNTER — Other Ambulatory Visit: Payer: Self-pay

## 2019-09-29 ENCOUNTER — Encounter: Payer: Self-pay | Admitting: Hematology and Oncology

## 2019-09-29 ENCOUNTER — Other Ambulatory Visit: Payer: Self-pay | Admitting: Gastroenterology

## 2019-09-29 ENCOUNTER — Inpatient Hospital Stay: Payer: Medicare HMO | Attending: Hematology and Oncology | Admitting: Hematology and Oncology

## 2019-09-29 VITALS — BP 105/52 | HR 70 | Temp 97.7°F | Resp 18 | Ht 63.0 in | Wt 171.6 lb

## 2019-09-29 DIAGNOSIS — R911 Solitary pulmonary nodule: Secondary | ICD-10-CM | POA: Diagnosis not present

## 2019-09-29 DIAGNOSIS — K7469 Other cirrhosis of liver: Secondary | ICD-10-CM

## 2019-09-29 DIAGNOSIS — D500 Iron deficiency anemia secondary to blood loss (chronic): Secondary | ICD-10-CM | POA: Diagnosis not present

## 2019-09-29 DIAGNOSIS — L299 Pruritus, unspecified: Secondary | ICD-10-CM

## 2019-09-29 DIAGNOSIS — Z7189 Other specified counseling: Secondary | ICD-10-CM | POA: Diagnosis not present

## 2019-09-29 DIAGNOSIS — E538 Deficiency of other specified B group vitamins: Secondary | ICD-10-CM

## 2019-09-29 DIAGNOSIS — N83202 Unspecified ovarian cyst, left side: Secondary | ICD-10-CM | POA: Diagnosis not present

## 2019-09-29 DIAGNOSIS — Z95828 Presence of other vascular implants and grafts: Secondary | ICD-10-CM

## 2019-09-29 MED ORDER — CYANOCOBALAMIN 1000 MCG/ML IJ SOLN
1000.0000 ug | Freq: Once | INTRAMUSCULAR | Status: AC
  Administered 2019-09-29: 1000 ug via INTRAMUSCULAR
  Filled 2019-09-29: qty 1

## 2019-09-29 NOTE — Patient Instructions (Signed)

## 2019-10-04 ENCOUNTER — Other Ambulatory Visit: Payer: Self-pay | Admitting: Gastroenterology

## 2019-10-04 DIAGNOSIS — K7290 Hepatic failure, unspecified without coma: Secondary | ICD-10-CM

## 2019-10-04 DIAGNOSIS — K7460 Unspecified cirrhosis of liver: Secondary | ICD-10-CM

## 2019-10-06 DIAGNOSIS — N83202 Unspecified ovarian cyst, left side: Secondary | ICD-10-CM | POA: Insufficient documentation

## 2019-10-06 DIAGNOSIS — R69 Illness, unspecified: Secondary | ICD-10-CM | POA: Diagnosis not present

## 2019-10-07 ENCOUNTER — Ambulatory Visit: Payer: Medicare HMO | Admitting: Gastroenterology

## 2019-10-07 NOTE — Telephone Encounter (Signed)
Please approval refill

## 2019-10-14 ENCOUNTER — Other Ambulatory Visit: Payer: Self-pay

## 2019-10-14 ENCOUNTER — Ambulatory Visit (INDEPENDENT_AMBULATORY_CARE_PROVIDER_SITE_OTHER): Payer: Medicare HMO | Admitting: Gastroenterology

## 2019-10-14 ENCOUNTER — Encounter: Payer: Self-pay | Admitting: Gastroenterology

## 2019-10-14 VITALS — BP 132/73 | HR 76 | Temp 98.2°F | Wt 171.5 lb

## 2019-10-14 DIAGNOSIS — R17 Unspecified jaundice: Secondary | ICD-10-CM | POA: Diagnosis not present

## 2019-10-14 DIAGNOSIS — Z95828 Presence of other vascular implants and grafts: Secondary | ICD-10-CM

## 2019-10-14 DIAGNOSIS — K7460 Unspecified cirrhosis of liver: Secondary | ICD-10-CM | POA: Diagnosis not present

## 2019-10-14 DIAGNOSIS — K7290 Hepatic failure, unspecified without coma: Secondary | ICD-10-CM | POA: Diagnosis not present

## 2019-10-14 DIAGNOSIS — Z8719 Personal history of other diseases of the digestive system: Secondary | ICD-10-CM

## 2019-10-14 MED ORDER — MISC. DEVICES KIT: PACK | 0 refills | Status: DC

## 2019-10-14 NOTE — Patient Instructions (Addendum)
MRI is scheduled for 10/24/2019 [facility]30 for 8:00am appointment. Nothing to eat or drank after midnight

## 2019-10-14 NOTE — Progress Notes (Signed)
[name], MD [address]  [place]  [place], [place] [address]  Main: [phone]  Fax: [phone]    Gastroenterology Consultation  Referring Provider:     Lorelee Market, MD Primary Care Physician:  Lorelee Market, MD Primary Gastroenterologist:  Dr. [name] Reason for Consultation:     Decompensated cirrhosis        HPI:   Crystal [name] is a 68 y.o. female referred by Dr. Lorelee Market, MD  for consultation & management of decompensated cirrhosis.   Follow-up visit 06/26/2019 Patient was told to make a follow-up to see me to discuss about abnormal labs by her PCP.  I reviewed the labs from 06/12/2019 which revealed normal hemoglobin, platelets 110, normal BUN/creatinine, total bilirubin 2, alkaline phosphatase 170.  She had repeat labs at Dr. Pasty Arch office in [place] on 8/3, alkaline phosphatase 173, AST 60, total bilirubin 1.5, creatinine 1.18.  Patient reports she has been doing very well.  She denies any swelling of legs, dark urine, pruritus, abdominal pain, melena.  She feels she has remained the same from liver standpoint.  She is requesting lactulose for 3 months supply  She is single, lives with a roommate  Follow-up visit 10/14/2019 Patient was admitted to [facility] secondary to hypokalemia which was corrected.  She is currently taking oral potassium supplements along with Lasix 80 mg daily and spironolactone 50 mg daily.  She continues to take lactulose and rifaximin.  She has generalized pruritus for which she takes Atarax 20 mg at bedtime and is able to sleep through night.  She reports that she remains alert and awake during the day.  She reports having good sleep.  She denies melena, rectal bleeding, nausea, vomiting, abdominal pain, distention.  She does report bilateral swelling of legs.  Her most recent labs from PCP were reviewed which are stable.  NSAIDs: none including BC powder or Goody powder  Antiplts/Anticoagulants/Anti  thrombotics: none  GI Procedures:  EGD 11/19/2018 - Normal duodenal bulb and second portion of the duodenum. - Gastric antral vascular ectasia with bleeding. Treated with argon plasma coagulation (APC). - Normal gastroesophageal junction and esophagus. - No specimens collected.  EGD 08/03/2018 - Normal duodenal bulb and second portion of the duodenum. - Portal hypertensive gastropathy. - Bleeding large (> 5 mm) esophageal varices, ligation was unsuccessful. - No specimens collected.  EGD 04/22/2018 - Normal duodenal bulb and second portion of the duodenum. - Non-bleeding gastric ulcer with a clean ulcer base (Forrest Class III). - Portal hypertensive gastropathy. - Large (> 5 mm) esophageal varices. Incompletely eradicated. Banded x 2. - No specimens collected.  EGD 03/25/2018 - Two non-bleeding angioectasias in the duodenum. Treated with argon plasma coagulation (APC). - A few non-bleeding angioectasias in the stomach. Treated with argon plasma coagulation (APC). - Non-bleeding gastric ulcers with a clean ulcer base (Forrest Class III). - Non-bleeding large (> 5 mm) esophageal varices. Incompletely eradicated. Banded. - No specimens collected.   Colonoscopy 03/25/2018 - One 5 mm polyp in the ascending colon, removed with a cold snare. Resected and retrieved. - Rectal varices. - External hemorrhoids. - The examination was otherwise normal.  DIAGNOSIS:  A. COLON POLYP, ASCENDING; COLD SNARE:  - TUBULAR ADENOMA.  - NEGATIVE FOR HIGH-GRADE DYSPLASIA AND MALIGNANCY.  EGD 02/09/18 The examined duodenum was normal. The stomach was normal. Five columns of non-bleeding grade III varices were found in the lower third of the esophagus,. Stigmata of recent bleeding were evident and red wale signs  were present. Ten bands were successfully placed with incomplete eradication of varices. There was no bleeding during, and at the end, of the procedure.  Past Medical History:  Diagnosis  Date  . Acute GI bleeding 02/09/2018  . Acute upper gastrointestinal bleeding 08/03/2018  . Anemia    TAKES IRON TAB  . Arthritis    SHOULDER  . B12 deficiency 02/18/2018  . Bleeding    GI  3/19  . Bronchitis    HX OF  . Cirrhosis ([place])   . Colon cancer screening   . Diabetes mellitus without complication ([place])    TYPE 2  . Fatty (change of) liver, not elsewhere classified   . Full dentures    UPPER AND LOWER  . Iron deficiency anemia due to chronic blood loss 02/18/2018  . Lung nodule, multiple   . Pernicious anemia 02/22/2018  . PUD (peptic ulcer disease)   . Raynaud's syndrome   . Upper GI bleeding 08/03/2018    Past Surgical History:  Procedure Laterality Date  . CATARACT EXTRACTION W/PHACO Right 02/06/2018   Procedure: CATARACT EXTRACTION PHACO AND INTRAOCULAR LENS PLACEMENT ([place]) RIGHT DIABETIC;  Surgeon: Leandrew Koyanagi, MD;  Location: [place];  Service: Ophthalmology;  Laterality: Right;  DIABETIC, ORAL MED  . CATARACT EXTRACTION W/PHACO Left 04/30/2018   Procedure: CATARACT EXTRACTION PHACO AND INTRAOCULAR LENS PLACEMENT (I[facility]);  Surgeon: Leandrew Koyanagi, MD;  Location: [facility];  Service: Ophthalmology;  Laterality: Left;  [place] 01:04 AP% 21.3 CDE 13.66 Fluid pack lot # [id number]  . COLONOSCOPY WITH PROPOFOL N/A 03/25/2018   Procedure: COLONOSCOPY WITH PROPOFOL;  Surgeon: Lin Landsman, MD;  Location: [facility] ENDOSCOPY;  Service: Gastroenterology;  Laterality: N/A;  . DENTAL SURGERY     EXTRACTIONS  . ELECTROMAGNETIC NAVIGATION BROCHOSCOPY Right 01/27/2019   Procedure: ELECTROMAGNETIC NAVIGATION BRONCHOSCOPY;  Surgeon: [name], MD;  Location: [facility];  Service: Cardiopulmonary;  Laterality: Right;  . ESOPHAGOGASTRODUODENOSCOPY N/A 08/03/2018   Procedure: ESOPHAGOGASTRODUODENOSCOPY (EGD);  Surgeon: Lin Landsman, MD;  Location: [facility] ENDOSCOPY;  Service: Gastroenterology;  Laterality: N/A;  . ESOPHAGOGASTRODUODENOSCOPY (EGD) WITH PROPOFOL N/A  02/10/2018   Procedure: ESOPHAGOGASTRODUODENOSCOPY (EGD) WITH PROPOFOL;  Surgeon: Jonathon Bellows, MD;  Location: [facility] ENDOSCOPY;  Service: Gastroenterology;  Laterality: N/A;  . ESOPHAGOGASTRODUODENOSCOPY (EGD) WITH PROPOFOL N/A 03/25/2018   Procedure: ESOPHAGOGASTRODUODENOSCOPY (EGD) WITH PROPOFOL;  Surgeon: Lin Landsman, MD;  Location: [facility] ENDOSCOPY;  Service: Gastroenterology;  Laterality: N/A;  . ESOPHAGOGASTRODUODENOSCOPY (EGD) WITH PROPOFOL N/A 04/22/2018   Procedure: ESOPHAGOGASTRODUODENOSCOPY (EGD) WITH PROPOFOL with band ligation;  Surgeon: Lin Landsman, MD;  Location: [place];  Service: Gastroenterology;  Laterality: N/A;  . ESOPHAGOGASTRODUODENOSCOPY (EGD) WITH PROPOFOL N/A 06/24/2018   Procedure: ESOPHAGOGASTRODUODENOSCOPY (EGD) WITH PROPOFOL;  Surgeon: Lin Landsman, MD;  Location: [facility] ENDOSCOPY;  Service: Gastroenterology;  Laterality: N/A;  . ESOPHAGOGASTRODUODENOSCOPY (EGD) WITH PROPOFOL N/A 11/19/2018   Procedure: ESOPHAGOGASTRODUODENOSCOPY (EGD) WITH PROPOFOL;  Surgeon: Lin Landsman, MD;  Location: [facility] ENDOSCOPY;  Service: Gastroenterology;  Laterality: N/A;  . EYE SURGERY    . HEMORRHOID BANDING  03/25/2018   Procedure: HEMORRHOID BANDING;  Surgeon: Lin Landsman, MD;  Location: [facility]OSCOPY;  Service: Gastroenterology;;  . IR EMBO ART  VEN HEMORR LYMPH EXTRAV  INC GUIDE ROADMAPPING  08/07/2018  . IR RADIOLOGIST EVAL & MGMT  09/03/2018  . IR RADIOLOGIST EVAL & MGMT  12/17/2018  . IR RADIOLOGIST EVAL & MGMT  06/24/2019  . IR TIPS  08/07/2018  . RADIOLOGY WITH ANESTHESIA N/A 08/07/2018   Procedure:  TIPS;  Surgeon: Corrie Mckusick, DO;  Location: [place];  Service: Anesthesiology;  Laterality: N/A;  . TONSILLECTOMY       Current Outpatient Medications:  .  cyanocobalamin (,VITAMIN B-12,) 1000 MCG/ML injection, Inject 1,000 mcg into the muscle every 30 (thirty) days. , Disp: , Rfl:  .  furosemide (LASIX) 20 MG tablet, TAKE 2 TABLETS BY MOUTH EVERY DAY  (Patient taking differently: Take 40 mg by mouth daily. ), Disp: 180 tablet, Rfl: 1 .  hydrOXYzine (ATARAX/VISTARIL) 10 MG tablet, TAKE 1 TABLET (10 MG TOTAL) BY MOUTH 3 (THREE) TIMES DAILY AS NEEDED FOR ITCHING., Disp: 30 tablet, Rfl: 0 .  lactulose (CHRONULAC) 10 GM/15ML solution, Take 30 mLs (20 g total) by mouth 2 (two) times daily., Disp: 1800 mL, Rfl: 2 .  metFORMIN (GLUCOPHAGE) 500 MG tablet, Take 1,000 mg by mouth 2 (two) times daily with a meal. , Disp: , Rfl:  .  nystatin (MYCOSTATIN/NYSTOP) powder, Apply 1 g topically as needed (rash). , Disp: , Rfl:  .  ondansetron (ZOFRAN) 4 MG tablet, ondansetron HCl 4 mg tablet  TAKE 1 TABLET BY MOUTH EVERY 6 HOURS AS NEEDED, Disp: , Rfl:  .  pantoprazole (PROTONIX) 40 MG tablet, Take 40 mg by mouth 2 (two) times daily., Disp: , Rfl:  .  rifaximin (XIFAXAN) 550 MG TABS tablet, TAKE 1 TABLET (550 MG TOTAL) BY MOUTH 2 (TWO) TIMES DAILY. (Patient taking differently: Take 550 mg by mouth 2 (two) times daily. ), Disp: 60 tablet, Rfl: 5 .  spironolactone (ALDACTONE) 50 MG tablet, TAKE 1 TABLET BY MOUTH EVERY DAY (Patient taking differently: Take 50 mg by mouth daily. ), Disp: 90 tablet, Rfl: 1 .  Vitamin D, Ergocalciferol, (DRISDOL) 1.25 MG (50000 UT) CAPS capsule, TAKE 1 CAPSULE BY MOUTH ONE TIME PER WEEK (Patient taking differently: Take 50,000 Units by mouth once a week. ), Disp: 12 capsule, Rfl: 0 .  Misc. Devices KIT, Moderate compression hose 20-30 MM HG, Disp: 1 kit, Rfl: 0 .  potassium chloride (KLOR-CON 10) 10 MEQ tablet, Take 1 tablet (10 mEq total) by mouth daily for 4 days., Disp: 4 tablet, Rfl: 0   Family History  Problem Relation Age of Onset  . CAD Father   . Heart attack Father 25  . Cancer Maternal Uncle   . Seizures Mother      Social History   Tobacco Use  . Smoking status: Never Smoker  . Smokeless tobacco: Never Used  Substance Use Topics  . Alcohol use: Not Currently    Frequency: Never    Comment: No EtOH for 30 years.   Never a heavy drinker.  . Drug use: Never    Allergies as of 10/14/2019  . (No Known Allergies)    Review of Systems:    All systems reviewed and negative except where noted in HPI.   Physical Exam:  BP 132/73 (BP Location: Left Arm, Patient Position: Sitting, Cuff Size: Normal)   Pulse 76   Temp 98.2 F (36.8 C) (Oral)   Wt 171 lb 8 oz (77.8 kg)   LMP  (LMP Unknown)   BMI 30.38 kg/m  No LMP recorded (lmp unknown). Patient is postmenopausal.  General:   Alert,  Well-developed, well-nourished, pleasant and cooperative in NAD, lethargic Head:  Normocephalic and atraumatic. Eyes:  Sclera clear, no icterus.   Conjunctiva pink. Ears:  Normal auditory acuity. Nose:  No deformity, discharge, or lesions. Mouth:  No deformity or lesions,oropharynx pink & moist. Neck:  Supple;  no masses or thyromegaly. Lungs:  Respirations even and unlabored.  Clear throughout to auscultation.   No wheezes, crackles, or rhonchi. No acute distress. Heart:  Regular rate and rhythm; no murmurs, clicks, rubs, or gallops. Abdomen:  Normal bowel sounds. Soft, non-tender and nondistended, without masses, hepatosplenomegaly or hernias noted.  No guarding or rebound tenderness.   Rectal: Not performed Msk:  Symmetrical without gross deformities. muscle wasting in bilateral upper extremities Good, equal movement & strength bilaterally. Pulses:  Normal pulses noted. Extremities:  No clubbing, trace edema, no cyanosis Neurologic:  Alert and oriented x3;  grossly normal neurologically. Skin: telangiectasias on upper anterior chest, the rash in her bilateral groin appears like a erythematous ring with central clearing, dry scaly margins Lymph Nodes:  No significant cervical adenopathy. Psych:  Alert and cooperative. Normal mood and affect.  Imaging Studies: Reviewed. CT A/P with contrast on 02/09/2018 revealed mildly enlarged retroperitoneal lymph nodes, largest with 11 mm concerning for metastatic disease or  malignancy,18 mm spiculated density in the right lower lobe concerning for malignancy associated with small left pleural effusion  Assessment and Plan:   Saffron A Edmonston is a 68 y.o. Caucasian female with metabolic syndrome, with decompensated cirrhosis secondary to ascites and refractory variceal bleed from esophageal varices status post TIPS and coil embolization of the left gastric vein here for follow-up  Decompensated cirrhosis: child class B, MELD-Na 9 Secondary liver disease workup positive for HCV antibodies, but viral load not detected, anti-smooth muscle antibodies weakly positive. 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 Ceruloplasmin, antimitochondrial antibodies, hepatitis B, alpha-1 antitrypsin came back negative Portal hypertension: manifested as ascites, esophageal varices, splenomegaly.  Patient currently has no ascites, controlled on diuretics S/p TIPS: Secondary to refractory variceal bleed. Follow-up with interventional radiology every 6 months.  TIPS patent Ascites/volume overload:currently hypervolemic with bilateral swelling of legs, continue Lasix 40 mg once a day, spironolactone 59m daily. Suggested her to check weight.  Regarding, continue low-sodium diet. Avoid processed meats, and foods, chips, carbonated beverages.  Patient evaluated by cardiology, stable cardiac function, advised no further intervention at this time. Varices: variceal bleed from esophageal varices, status post EGD on 02/10/2018, EVLx10, status post EGD 03/25/2018 EVLx4, s/p EGD 04/22/18 EVLx2 and hemospray, refractory variceal bleed 08/03/2018, status post TIPS with coil embolization of left gastric vein on 08/07/2018.  Stopped propranolol  Repeat EGD in 11/2018 revealed no varices at all, GAVE treated with APC.   Pruritus: Continue hydroxyzine 10 mg 3 times a day as needed most likely secondary to cholestasis.  Stable alkaline phosphatase  Anemia: Resolved,  multifactorial- combination of peptic ulcer disease, gastric AVMs, portal hypertensive gastropathy, esophageal varices, GAVE. she has developed severe iron deficiency anemia secondary to variceal hemorrhage, peptic ulcer disease, gastric AVMs and portal hypertensive gastropathy. Hemoglobin  stable , iron studies revealed improved ferritin levels, B12 deficiency has resolved. There is no evidence of coagulopathy.  She has mild thrombocytopenia HCC screening: AFP normal, no liver lesions on CT in 07/2018.  Repeat ultrasound in 05/2019 with no liver lesions.  Recommend MRI liver mass protocol with MRCP due to elevated alkaline phosphatase and total bilirubin PSE: Started on lactulose post TIPS.  Continue rifaximin 550 mg twice daily, long-term.  HRS: none Hepatic hydrothorax: none  Health maintenance:  She is immune to hepatitis B She received Pneumovax and influenza vaccine in 07/2018 vitamin D levels 29.2 on 03/15/2018 Avoid excess Tylenol and NSAIDs use Colon cancer screening:underwent colonoscopy on 04/11/2018,  One 5 mm polyp in the ascending colon, removed with a cold snare, Patch showed tubular adenoma with no evidence of high-grade dysplasia or malignancy. Recommend repeat colonoscopy in 5 years Right lower lobe lung lesion and retroperitoneal lymphadenopathy: follow-up with Dr. Mike Gip, no evidence of retroperitoneal lymphadenopathy. Repeat CT chest revealed that the lesions were stable.  Patient underwent bronchoscopy, sample was limited, no evidence of malignancy.  Plan to monitor the lesion with imaging for now  Follow up in 3 months   [name], MD

## 2019-10-17 DIAGNOSIS — R69 Illness, unspecified: Secondary | ICD-10-CM | POA: Diagnosis not present

## 2019-10-20 ENCOUNTER — Other Ambulatory Visit: Payer: Self-pay | Admitting: Gastroenterology

## 2019-10-20 DIAGNOSIS — K7682 Hepatic encephalopathy: Secondary | ICD-10-CM

## 2019-10-20 DIAGNOSIS — K7290 Hepatic failure, unspecified without coma: Secondary | ICD-10-CM

## 2019-10-20 DIAGNOSIS — M79644 Pain in right finger(s): Secondary | ICD-10-CM | POA: Diagnosis not present

## 2019-10-20 DIAGNOSIS — K7469 Other cirrhosis of liver: Secondary | ICD-10-CM

## 2019-10-20 DIAGNOSIS — M86141 Other acute osteomyelitis, right hand: Secondary | ICD-10-CM | POA: Diagnosis not present

## 2019-10-20 NOTE — Telephone Encounter (Signed)
Last office visit 10/14/2019 Last refill 05/20/2019 5 refills

## 2019-10-24 ENCOUNTER — Ambulatory Visit
Admission: RE | Admit: 2019-10-24 | Discharge: 2019-10-24 | Disposition: A | Discharge status: Home / Self Care | Payer: Medicare HMO | Source: Ambulatory Visit | Attending: Gastroenterology | Admitting: Gastroenterology

## 2019-10-24 ENCOUNTER — Other Ambulatory Visit: Payer: Self-pay

## 2019-10-24 ENCOUNTER — Other Ambulatory Visit: Payer: Self-pay | Admitting: Gastroenterology

## 2019-10-24 DIAGNOSIS — R17 Unspecified jaundice: Secondary | ICD-10-CM

## 2019-10-24 LAB — POCT I-STAT CREATININE: Creatinine, Ser: 1.1 mg/dL — ABNORMAL HIGH (ref 0.44–1.00)

## 2019-10-27 ENCOUNTER — Other Ambulatory Visit: Payer: Self-pay

## 2019-10-27 ENCOUNTER — Inpatient Hospital Stay: Payer: Medicare HMO | Attending: Hematology and Oncology

## 2019-10-27 VITALS — BP 113/62 | HR 75 | Temp 97.9°F | Resp 16

## 2019-10-27 DIAGNOSIS — E538 Deficiency of other specified B group vitamins: Secondary | ICD-10-CM | POA: Diagnosis not present

## 2019-10-27 DIAGNOSIS — D500 Iron deficiency anemia secondary to blood loss (chronic): Secondary | ICD-10-CM | POA: Diagnosis not present

## 2019-10-27 MED ORDER — CYANOCOBALAMIN 1000 MCG/ML IJ SOLN
1000.0000 ug | Freq: Once | INTRAMUSCULAR | Status: AC
  Administered 2019-10-27: 1000 ug via INTRAMUSCULAR

## 2019-10-28 ENCOUNTER — Encounter: Payer: Self-pay | Admitting: Hematology and Oncology

## 2019-10-29 ENCOUNTER — Telehealth: Payer: Self-pay | Admitting: Oncology

## 2019-10-29 NOTE — Telephone Encounter (Signed)
Re: [employer] work appears stable  Hemoglobin 10.3 Hematocrit 30 Platelets 120,000  Iron saturations 34% Ferritin 41 Reticulocytes 1.7  Receives B12 injections monthly.   Faythe Casa, NP 10/29/2019 2:59 PM

## 2019-10-30 ENCOUNTER — Encounter: Payer: Self-pay | Admitting: Hematology and Oncology

## 2019-11-03 ENCOUNTER — Other Ambulatory Visit: Payer: Self-pay | Admitting: Gastroenterology

## 2019-11-03 DIAGNOSIS — L299 Pruritus, unspecified: Secondary | ICD-10-CM

## 2019-11-03 DIAGNOSIS — Z95828 Presence of other vascular implants and grafts: Secondary | ICD-10-CM

## 2019-11-03 DIAGNOSIS — K7469 Other cirrhosis of liver: Secondary | ICD-10-CM

## 2019-11-12 ENCOUNTER — Other Ambulatory Visit: Payer: Self-pay

## 2019-11-12 ENCOUNTER — Ambulatory Visit
Admission: RE | Admit: 2019-11-12 | Discharge: 2019-11-12 | Disposition: A | Discharge status: Home / Self Care | Payer: Medicare HMO | Source: Ambulatory Visit | Attending: Gastroenterology | Admitting: Gastroenterology

## 2019-11-12 DIAGNOSIS — R932 Abnormal findings on diagnostic imaging of liver and biliary tract: Secondary | ICD-10-CM | POA: Diagnosis not present

## 2019-11-12 DIAGNOSIS — R17 Unspecified jaundice: Secondary | ICD-10-CM | POA: Insufficient documentation

## 2019-11-12 DIAGNOSIS — R188 Other ascites: Secondary | ICD-10-CM | POA: Diagnosis not present

## 2019-11-12 DIAGNOSIS — K8020 Calculus of gallbladder without cholecystitis without obstruction: Secondary | ICD-10-CM | POA: Diagnosis not present

## 2019-11-12 DIAGNOSIS — R600 Localized edema: Secondary | ICD-10-CM | POA: Diagnosis not present

## 2019-11-12 DIAGNOSIS — K7460 Unspecified cirrhosis of liver: Secondary | ICD-10-CM | POA: Diagnosis not present

## 2019-11-12 MED ORDER — GADOBUTROL 1 MMOL/ML IV SOLN
7.0000 mL | Freq: Once | INTRAVENOUS | Status: AC | PRN
  Administered 2019-11-12: 10:00:00 7 mL via INTRAVENOUS

## 2019-11-17 ENCOUNTER — Telehealth: Payer: Self-pay

## 2019-11-17 NOTE — Telephone Encounter (Signed)
Pt has been notified of results and verbalized understanding

## 2019-11-21 ENCOUNTER — Other Ambulatory Visit: Payer: Self-pay

## 2019-11-23 DIAGNOSIS — R69 Illness, unspecified: Secondary | ICD-10-CM | POA: Diagnosis not present

## 2019-11-24 ENCOUNTER — Other Ambulatory Visit: Payer: Self-pay

## 2019-11-24 ENCOUNTER — Telehealth: Payer: Self-pay | Admitting: Gastroenterology

## 2019-11-24 ENCOUNTER — Inpatient Hospital Stay: Payer: Medicare HMO | Attending: Hematology and Oncology

## 2019-11-24 VITALS — BP 130/70 | HR 76 | Temp 97.1°F

## 2019-11-24 DIAGNOSIS — E538 Deficiency of other specified B group vitamins: Secondary | ICD-10-CM | POA: Insufficient documentation

## 2019-11-24 MED ORDER — CYANOCOBALAMIN 1000 MCG/ML IJ SOLN
1000.0000 ug | Freq: Once | INTRAMUSCULAR | Status: AC
  Administered 2019-11-24: 1000 ug via INTRAMUSCULAR

## 2019-11-24 NOTE — Telephone Encounter (Signed)
Pt has been notified and will get Covid vaccine

## 2019-11-24 NOTE — Telephone Encounter (Signed)
Pt is calling to ask Dr. Marius Ditch  If it is ok for her to take the Covid 19 Vaccination ok to leave vm if she does not answer [phone]

## 2019-11-24 NOTE — Telephone Encounter (Signed)
Yes, she should definitely take COVID-19 vaccine  RV

## 2019-11-26 ENCOUNTER — Other Ambulatory Visit: Payer: Self-pay | Admitting: Gastroenterology

## 2019-11-26 DIAGNOSIS — D500 Iron deficiency anemia secondary to blood loss (chronic): Secondary | ICD-10-CM

## 2019-11-26 DIAGNOSIS — K7581 Nonalcoholic steatohepatitis (NASH): Secondary | ICD-10-CM

## 2019-11-26 DIAGNOSIS — I8511 Secondary esophageal varices with bleeding: Secondary | ICD-10-CM

## 2019-11-26 DIAGNOSIS — K7460 Unspecified cirrhosis of liver: Secondary | ICD-10-CM

## 2019-11-26 DIAGNOSIS — R188 Other ascites: Secondary | ICD-10-CM

## 2019-11-26 DIAGNOSIS — I8510 Secondary esophageal varices without bleeding: Secondary | ICD-10-CM

## 2019-12-01 DIAGNOSIS — R69 Illness, unspecified: Secondary | ICD-10-CM | POA: Diagnosis not present

## 2019-12-11 ENCOUNTER — Telehealth: Payer: Self-pay | Admitting: Gastroenterology

## 2019-12-11 DIAGNOSIS — K922 Gastrointestinal hemorrhage, unspecified: Secondary | ICD-10-CM | POA: Diagnosis not present

## 2019-12-11 DIAGNOSIS — E7800 Pure hypercholesterolemia, unspecified: Secondary | ICD-10-CM | POA: Diagnosis not present

## 2019-12-11 DIAGNOSIS — E669 Obesity, unspecified: Secondary | ICD-10-CM | POA: Diagnosis not present

## 2019-12-11 DIAGNOSIS — J449 Chronic obstructive pulmonary disease, unspecified: Secondary | ICD-10-CM | POA: Diagnosis not present

## 2019-12-11 DIAGNOSIS — Z1389 Encounter for screening for other disorder: Secondary | ICD-10-CM | POA: Diagnosis not present

## 2019-12-11 DIAGNOSIS — E119 Type 2 diabetes mellitus without complications: Secondary | ICD-10-CM | POA: Diagnosis not present

## 2019-12-11 NOTE — Telephone Encounter (Signed)
[facility] left vm regarding pt  Needing authorization  And clinical information send to them please call  [phone]

## 2019-12-11 NOTE — Telephone Encounter (Signed)
Pt left vm  She states she has been out of her medicine Xifaxan for several days and it is starting to affect her and the drug store has no authorization from [place]

## 2019-12-15 ENCOUNTER — Telehealth: Payer: Self-pay | Admitting: Gastroenterology

## 2019-12-15 DIAGNOSIS — D500 Iron deficiency anemia secondary to blood loss (chronic): Secondary | ICD-10-CM | POA: Diagnosis not present

## 2019-12-15 NOTE — Telephone Encounter (Signed)
Patient called & ask if Dr Marius Ditch needs to see her today? She has had one of her episodes where her mind went crazy. Please advise.

## 2019-12-15 NOTE — Telephone Encounter (Signed)
I can see her this week, virtual or in person  RV

## 2019-12-15 NOTE — Telephone Encounter (Signed)
LEFT VM FOR PT TO OFFER PAT

## 2019-12-16 ENCOUNTER — Encounter: Payer: Self-pay | Admitting: Hematology and Oncology

## 2019-12-16 NOTE — Telephone Encounter (Signed)
Authorization has been approved through 04/2020, pt has picked up medication

## 2019-12-17 ENCOUNTER — Ambulatory Visit (INDEPENDENT_AMBULATORY_CARE_PROVIDER_SITE_OTHER): Payer: Medicare HMO | Admitting: Gastroenterology

## 2019-12-17 ENCOUNTER — Other Ambulatory Visit: Payer: Self-pay

## 2019-12-17 ENCOUNTER — Telehealth: Payer: Self-pay | Admitting: Hematology and Oncology

## 2019-12-17 DIAGNOSIS — K7460 Unspecified cirrhosis of liver: Secondary | ICD-10-CM | POA: Diagnosis not present

## 2019-12-17 DIAGNOSIS — K7682 Hepatic encephalopathy: Secondary | ICD-10-CM

## 2019-12-17 DIAGNOSIS — K7290 Hepatic failure, unspecified without coma: Secondary | ICD-10-CM

## 2019-12-17 NOTE — Progress Notes (Signed)
Crystal Sear, MD [address]  [place]  [place], [place] [address]  Main: [phone]  Fax: [phone]    Gastroenterology Consultation Tele Visit  Referring Provider:     Lorelee Market, MD Primary Care Physician:  Crystal Market, MD Primary Gastroenterologist:  Dr. [name] Reason for Consultation:   Altered mental status        HPI:   Crystal [name] is a 69 y.o. female referred by Dr. Lorelee Market, MD  for consultation & management of altered mental status  Virtual Visit via Telephone Note  I connected with Crystal [name] on 12/17/19 at  9:45 AM EST by telephone and verified that I am speaking with the correct person using two identifiers.   I discussed the limitations, risks, security and privacy concerns of performing an evaluation and management service by telephone and the availability of in person appointments. I also discussed with the patient that there may be a patient responsible charge related to this service. The patient expressed understanding and agreed to proceed.  Location of the Patient: Home  Location of the provider: Office  Persons participating in the visit: Patient and provider only  History of Present Illness: Patient called my office earlier this week due to altered mental status and she ran out of rifaximin and requested if she can be seen.  Therefore, arranged a televisit today.  Patient said that she noticed loss of memory and lethargic since Saturday and found that she ran out of rifaximin for 5 days.  She just picked up the medication and started taking it.  She feels today she has been doing well.  She denies abdominal pain, nausea, vomiting, rectal bleeding, melena, fevers, chills, cough, chest pain.  She is also taking lactulose 3 times a day, having bowel movements up to 3 times a day.  She thinks she is going too much and has cut back to 2 times a day now.  She reports being compliant with other medications, denies  swelling of legs or abdominal distention  Past Medical History:  Diagnosis Date  . Acute GI bleeding 02/09/2018  . Acute upper gastrointestinal bleeding 08/03/2018  . Anemia    TAKES IRON TAB  . Arthritis    SHOULDER  . B12 deficiency 02/18/2018  . Bleeding    GI  3/19  . Bronchitis    HX OF  . Cirrhosis ([place])   . Colon cancer screening   . Diabetes mellitus without complication ([place])    TYPE 2  . Fatty (change of) liver, not elsewhere classified   . Full dentures    UPPER AND LOWER  . Iron deficiency anemia due to chronic blood loss 02/18/2018  . Lung nodule, multiple   . Pernicious anemia 02/22/2018  . PUD (peptic ulcer disease)   . Raynaud's syndrome   . Upper GI bleeding 08/03/2018    Past Surgical History:  Procedure Laterality Date  . CATARACT EXTRACTION W/PHACO Right 02/06/2018   Procedure: CATARACT EXTRACTION PHACO AND INTRAOCULAR LENS PLACEMENT ([place]) RIGHT DIABETIC;  Surgeon: Leandrew Koyanagi, MD;  Location: [facility];  Service: Ophthalmology;  Laterality: Right;  DIABETIC, ORAL MED  . CATARACT EXTRACTION W/PHACO Left 04/30/2018   Procedure: CATARACT EXTRACTION PHACO AND INTRAOCULAR LENS PLACEMENT ([facility]);  Surgeon: Leandrew Koyanagi, MD;  Location: [facility];  Service: Ophthalmology;  Laterality: Left;  [place] 01:04 AP% 21.3 CDE 13.66 Fluid pack lot # [id number]  . COLONOSCOPY WITH PROPOFOL N/A 03/25/2018   Procedure: COLONOSCOPY WITH  PROPOFOL;  Surgeon: Lin Landsman, MD;  Location: [facility] ENDOSCOPY;  Service: Gastroenterology;  Laterality: N/A;  . DENTAL SURGERY     EXTRACTIONS  . ELECTROMAGNETIC NAVIGATION BROCHOSCOPY Right 01/27/2019   Procedure: ELECTROMAGNETIC NAVIGATION BRONCHOSCOPY;  Surgeon: [name], MD;  Location: [facility];  Service: Cardiopulmonary;  Laterality: Right;  . ESOPHAGOGASTRODUODENOSCOPY N/A 08/03/2018   Procedure: ESOPHAGOGASTRODUODENOSCOPY (EGD);  Surgeon: Lin Landsman, MD;  Location: [facility] ENDOSCOPY;  Service:  Gastroenterology;  Laterality: N/A;  . ESOPHAGOGASTRODUODENOSCOPY (EGD) WITH PROPOFOL N/A 02/10/2018   Procedure: ESOPHAGOGASTRODUODENOSCOPY (EGD) WITH PROPOFOL;  Surgeon: Jonathon Bellows, MD;  Location: [facility] ENDOSCOPY;  Service: Gastroenterology;  Laterality: N/A;  . ESOPHAGOGASTRODUODENOSCOPY (EGD) WITH PROPOFOL N/A 03/25/2018   Procedure: ESOPHAGOGASTRODUODENOSCOPY (EGD) WITH PROPOFOL;  Surgeon: Lin Landsman, MD;  Location: [facility] ENDOSCOPY;  Service: Gastroenterology;  Laterality: N/A;  . ESOPHAGOGASTRODUODENOSCOPY (EGD) WITH PROPOFOL N/A 04/22/2018   Procedure: ESOPHAGOGASTRODUODENOSCOPY (EGD) WITH PROPOFOL with band ligation;  Surgeon: Lin Landsman, MD;  Location: [facility];  Service: Gastroenterology;  Laterality: N/A;  . ESOPHAGOGASTRODUODENOSCOPY (EGD) WITH PROPOFOL N/A 06/24/2018   Procedure: ESOPHAGOGASTRODUODENOSCOPY (EGD) WITH PROPOFOL;  Surgeon: Lin Landsman, MD;  Location: [facility] ENDOSCOPY;  Service: Gastroenterology;  Laterality: N/A;  . ESOPHAGOGASTRODUODENOSCOPY (EGD) WITH PROPOFOL N/A 11/19/2018   Procedure: ESOPHAGOGASTRODUODENOSCOPY (EGD) WITH PROPOFOL;  Surgeon: Lin Landsman, MD;  Location: [facility] ENDOSCOPY;  Service: Gastroenterology;  Laterality: N/A;  . EYE SURGERY    . HEMORRHOID BANDING  03/25/2018   Procedure: HEMORRHOID BANDING;  Surgeon: Lin Landsman, MD;  Location: [facility] ENDOSCOPY;  Service: Gastroenterology;;  . IR EMBO ART  VEN HEMORR LYMPH EXTRAV  INC GUIDE ROADMAPPING  08/07/2018  . IR RADIOLOGIST EVAL & MGMT  09/03/2018  . IR RADIOLOGIST EVAL & MGMT  12/17/2018  . IR RADIOLOGIST EVAL & MGMT  06/24/2019  . IR TIPS  08/07/2018  . RADIOLOGY WITH ANESTHESIA N/A 08/07/2018   Procedure: TIPS;  Surgeon: Corrie Mckusick, DO;  Location: [facility];  Service: Anesthesiology;  Laterality: N/A;  . TONSILLECTOMY      Current Outpatient Medications:  .  acetaminophen-codeine (TYLENOL #3) 300-30 MG tablet, Take 1 tablet by mouth every 6 (six) hours as needed.,  Disp: , Rfl:  .  cyanocobalamin (,VITAMIN B-12,) 1000 MCG/ML injection, Inject 1,000 mcg into the muscle every 30 (thirty) days. , Disp: , Rfl:  .  fluconazole (DIFLUCAN) 150 MG tablet, Take 150 mg by mouth daily., Disp: , Rfl:  .  furosemide (LASIX) 20 MG tablet, TAKE 2 TABLETS BY MOUTH EVERY DAY (Patient taking differently: Take 40 mg by mouth daily. ), Disp: 180 tablet, Rfl: 1 .  HYDROcodone-acetaminophen (NORCO) 7.5-325 MG tablet, Take 1 tablet by mouth every 4 (four) hours as needed., Disp: , Rfl:  .  hydrOXYzine (ATARAX/VISTARIL) 10 MG tablet, TAKE 1 TABLET (10 MG TOTAL) BY MOUTH 3 (THREE) TIMES DAILY AS NEEDED FOR ITCHING., Disp: 30 tablet, Rfl: 0 .  lactulose (CHRONULAC) 10 GM/15ML solution, Take 20 g by mouth 2 (two) times daily., Disp: , Rfl:  .  metFORMIN (GLUCOPHAGE) 500 MG tablet, Take 1,000 mg by mouth 2 (two) times daily with a meal. , Disp: , Rfl:  .  Misc. Devices KIT, Moderate compression hose 20-30 MM HG, Disp: 1 kit, Rfl: 0 .  mupirocin ointment (BACTROBAN) 2 %, mupirocin 2 % topical ointment, Disp: , Rfl:  .  nystatin (MYCOSTATIN/NYSTOP) powder, Apply 1 g topically as needed (rash). , Disp: , Rfl:  .  ondansetron (ZOFRAN) 4 MG tablet, ondansetron HCl  4 mg tablet  TAKE 1 TABLET BY MOUTH EVERY 6 HOURS AS NEEDED, Disp: , Rfl:  .  OneTouch Delica Lancets 52D MISC, 2 (two) times daily., Disp: , Rfl:  .  ONETOUCH ULTRA test strip, 2 (two) times daily., Disp: , Rfl:  .  pantoprazole (PROTONIX) 40 MG tablet, Take 40 mg by mouth 2 (two) times daily., Disp: , Rfl:  .  rifaximin (XIFAXAN) 550 MG TABS tablet, TAKE 1 TABLET (550 MG TOTAL) BY MOUTH 2 (TWO) TIMES DAILY., Disp: 180 tablet, Rfl: 1 .  spironolactone (ALDACTONE) 50 MG tablet, Take 1 tablet (50 mg total) by mouth daily., Disp: 60 tablet, Rfl: 1 .  VENTOLIN HFA 108 (90 Base) MCG/ACT inhaler, SMARTSIG:2 Puff(s) By Mouth Every 4 Hours PRN, Disp: , Rfl:  .  Vitamin D, Ergocalciferol, (DRISDOL) 1.25 MG (50000 UT) CAPS capsule, TAKE 1  CAPSULE BY MOUTH ONE TIME PER WEEK (Patient taking differently: Take 50,000 Units by mouth once a week. ), Disp: 12 capsule, Rfl: 0 .  potassium chloride (KLOR-CON 10) 10 MEQ tablet, Take 1 tablet (10 mEq total) by mouth daily for 4 days., Disp: 4 tablet, Rfl: 0   Family History  Problem Relation Age of Onset  . CAD Father   . Heart attack Father 37  . Cancer Maternal Uncle   . Seizures Mother      Social History   Tobacco Use  . Smoking status: Never Smoker  . Smokeless tobacco: Never Used  Substance Use Topics  . Alcohol use: Not Currently    Comment: No EtOH for 30 years.  Never a heavy drinker.  . Drug use: Never    Allergies as of 12/17/2019  . (No Known Allergies)     Imaging Studies: Reviewed  Assessment and Plan:   Crystal [name] is a 69 y.o. 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  This is most likely secondary to lack of rifaximin as she ran out of it last week.  Her abdomen to status has currently resolved.  She did not report any other symptoms to suggest infection.  Advised her to continue rifaximin 2 times a day as well as lactulose 1-2 times daily to keep her bowel movements regular.  Reviewed her labs from 2/1 which are at stable.  She also underwent MRI/MRCP liver mass protocol which did not reveal any evidence of neoplasm or choledocholithiasis/biliary obstruction   Follow Up Instructions:   I discussed the assessment and treatment plan with the patient. The patient was provided an opportunity to ask questions and all were answered. The patient agreed with the plan and demonstrated an understanding of the instructions.   The patient was advised to call back or seek an in-person evaluation if the symptoms worsen or if the condition fails to improve as anticipated.  I provided 15 minutes of non-face-to-face time during this encounter.    Follow up in 3 months   [name], MD

## 2019-12-17 NOTE — Patient Instructions (Signed)
Otherwise just call my office okay

## 2019-12-18 NOTE — Progress Notes (Signed)
[facility]  [address], [address] [place], [place] [address] Phone: [phone]  Fax: [phone]   Clinic Day:  12/22/2019  Referring physician: Lorelee Market, MD  Chief Complaint: Crystal [name] is a 69 y.o. female with tworight sided pulmonary nodules and lymphadenopathy who is seen for 3 month assessment.   HPI: The patient was last seen in the medical oncology clinic on 09/29/2019. At that time, she was doing "ok".  She denied any bleeding.  Exam was stable. Hematocrit 31.5, hemoglobin 10.6, platelets 116,000, WBC 3,400. Ferritin was 41 with an iron saturation of 34% and a TIBC of 192. I encouraged the patient to continue close follow ups with GI. She received a B12 injection.   Patient had a follow up with Dr. Marius Ditch on 10/14/2019. Patient was noted to have abnormal labs. Labs from 06/12/2019 revealed normal hemoglobin, platelets 110, normal BUN/creatinine, total bilirubin 2, alkaline phosphatase 170.  She had repeat labs at Dr. Pasty Arch office in [place] on 8/3, alkaline phosphatase 173, AST 60, total bilirubin 1.5, creatinine 1.18.  She was doing well. She denies any swelling of legs, dark urine, pruritus, abdominal pain, melena. She had mild thrombocytopenia. Dr. Marius Ditch recommended MRI liver mass protocol with MRCP due to elevated alkaline phosphatase and total bilirubin. She recommended a colonoscopy in 5 years.   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 There was no biliary duct dilatation. There was cholelithiasis without acute cholecystitis. Right hepatic lobe 9 mm lesion which was indeterminate, LR 3. This was possibly present on the prior CT of 2019.  Recommendation included repeat pre and post contrast abdominal MRI at 3-6 months. There as small volume abdominal ascites. The right lower lobe pulmonary nodule was suboptimally evaluated.  The patient had a virtual visit with Dr. Marius Ditch on 12/17/2019. She noted  memory loss and feeling lethargic prior to visit. She noted running out of rifaximin 5 days prior. She denied any abdominal pain, nausea, vomiting, rectal bleeding, melena, fevers, chills, cough, chest pain. She was taking lactulose 3 times a day with 3 bowel movements a day. Dr. Marius Ditch  advised her to continue rifaximin 2 times a day as well as lactulose 1-2 times daily to keep her bowel movements regular.  Patient received monthly B12 injections on 10/27/2019 and 11/24/2019.   [facility] labs followed: 10/27/2019: Hematocrit 30.0, hemoglobin 10.3, platelets 120,000, WBC 3,500. Ferritin was 41 with an iron saturation of 34% and a TIBC of 205. Folate was 12.3. Reticulocyte count 1.7%. Sed rate was 42.  12/15/2019: Hematocrit 29.7, hemoglobin 10.1, platelets 129,000, WBC 3,800. Ferritin was 21. Creatinine 1.36, albumin 3.1, bilirubin 1.3, alkaline phosphatase 172. Sed rate was 50.  During the interim, she has felt "alright". When she was off of Xifaxan for 5 days she had neurologic issues. After taking it for 3 days, she noticed improvement. She was able to rely on her family during that time.   The last 2 days she notes a burning sensation while urinating. She feels like she is getting a UTI. She notes weight gain related to fluids. She notes improvement with Lasix. She notes her itching is "bad". She notes ice pica x 1 week. She is eating well. She denies bleeding of any kind. She has a dry mouth. She has red spots on her face and back. She receives monthly B12 injections.  Patient agreed to a follow-up chest CT.  Patient denied follow-up pelvic ultrasound.    Past Medical History:  Diagnosis Date  . Acute GI bleeding 02/09/2018  . Acute upper gastrointestinal bleeding 08/03/2018  . Anemia    TAKES IRON TAB  . Arthritis    SHOULDER  . B12 deficiency 02/18/2018  . Bleeding    GI  3/19  . Bronchitis    HX OF  . Cirrhosis ([place])   . Colon cancer screening   . Diabetes mellitus without complication  ([place])    TYPE 2  . Fatty (change of) liver, not elsewhere classified   . Full dentures    UPPER AND LOWER  . Iron deficiency anemia due to chronic blood loss 02/18/2018  . Lung nodule, multiple   . Pernicious anemia 02/22/2018  . PUD (peptic ulcer disease)   . Raynaud's syndrome   . Upper GI bleeding 08/03/2018    Past Surgical History:  Procedure Laterality Date  . CATARACT EXTRACTION W/PHACO Right 02/06/2018   Procedure: CATARACT EXTRACTION PHACO AND INTRAOCULAR LENS PLACEMENT ([place]) RIGHT DIABETIC;  Surgeon: Leandrew Koyanagi, MD;  Location: [facility];  Service: Ophthalmology;  Laterality: Right;  DIABETIC, ORAL MED  . CATARACT EXTRACTION W/PHACO Left 04/30/2018   Procedure: CATARACT EXTRACTION PHACO AND INTRAOCULAR LENS PLACEMENT (I[facility]);  Surgeon: Leandrew Koyanagi, MD;  Location: [facility]S;  Service: Ophthalmology;  Laterality: Left;  [place] 01:04 AP% 21.3 CDE 13.66 Fluid pack lot # [id number]  . COLONOSCOPY WITH PROPOFOL N/A 03/25/2018   Procedure: COLONOSCOPY WITH PROPOFOL;  Surgeon: Lin Landsman, MD;  Location: [facility] ENDOSCOPY;  Service: Gastroenterology;  Laterality: N/A;  . DENTAL SURGERY     EXTRACTIONS  . ELECTROMAGNETIC NAVIGATION BROCHOSCOPY Right 01/27/2019   Procedure: ELECTROMAGNETIC NAVIGATION BRONCHOSCOPY;  Surgeon: [name], MD;  Location: [facility];  Service: Cardiopulmonary;  Laterality: Right;  . ESOPHAGOGASTRODUODENOSCOPY N/A 08/03/2018   Procedure: ESOPHAGOGASTRODUODENOSCOPY (EGD);  Surgeon: Lin Landsman, MD;  Location: [facility]COPY;  Service: Gastroenterology;  Laterality: N/A;  . ESOPHAGOGASTRODUODENOSCOPY (EGD) WITH PROPOFOL N/A 02/10/2018   Procedure: ESOPHAGOGASTRODUODENOSCOPY (EGD) WITH PROPOFOL;  Surgeon: Jonathon Bellows, MD;  Location: [facility] ENDOSCOPY;  Service: Gastroenterology;  Laterality: N/A;  . ESOPHAGOGASTRODUODENOSCOPY (EGD) WITH PROPOFOL N/A 03/25/2018   Procedure: ESOPHAGOGASTRODUODENOSCOPY (EGD) WITH PROPOFOL;  Surgeon: Lin Landsman, MD;  Location: [facility] ENDOSCOPY;  Service: Gastroenterology;  Laterality: N/A;  . ESOPHAGOGASTRODUODENOSCOPY (EGD) WITH PROPOFOL N/A 04/22/2018   Procedure: ESOPHAGOGASTRODUODENOSCOPY (EGD) WITH PROPOFOL with band ligation;  Surgeon: Lin Landsman, MD;  Location: [facility];  Service: Gastroenterology;  Laterality: N/A;  . ESOPHAGOGASTRODUODENOSCOPY (EGD) WITH PROPOFOL N/A 06/24/2018   Procedure: ESOPHAGOGASTRODUODENOSCOPY (EGD) WITH PROPOFOL;  Surgeon: Lin Landsman, MD;  Location: [facility] ENDOSCOPY;  Service: Gastroenterology;  Laterality: N/A;  . ESOPHAGOGASTRODUODENOSCOPY (EGD) WITH PROPOFOL N/A 11/19/2018   Procedure: ESOPHAGOGASTRODUODENOSCOPY (EGD) WITH PROPOFOL;  Surgeon: Lin Landsman, MD;  Location: [facility] ENDOSCOPY;  Service: Gastroenterology;  Laterality: N/A;  . EYE SURGERY    . HEMORRHOID BANDING  03/25/2018   Procedure: HEMORRHOID BANDING;  Surgeon: Lin Landsman, MD;  Location: [facility] ENDOSCOPY;  Service: Gastroenterology;;  . IR EMBO ART  VEN HEMORR LYMPH EXTRAV  INC GUIDE ROADMAPPING  08/07/2018  . IR RADIOLOGIST EVAL & MGMT  09/03/2018  . IR RADIOLOGIST EVAL & MGMT  12/17/2018  . IR RADIOLOGIST EVAL & MGMT  06/24/2019  . IR TIPS  08/07/2018  . RADIOLOGY WITH ANESTHESIA N/A 08/07/2018   Procedure: TIPS;  Surgeon: Corrie Mckusick, DO;  Location: [facility];  Service: Anesthesiology;  Laterality: N/A;  . TONSILLECTOMY      Family History  Problem Relation  Age of Onset  . CAD Father   . Heart attack Father 61  . Cancer Maternal Uncle   . Seizures Mother     Social History:  reports that she has never smoked. She has never used smokeless tobacco. She reports previous alcohol use. She reports that she does not use drugs. She retired from [employer] in 11/2017. She lives in [place]. The patient is alone today.  Allergies: No Known Allergies  Current Medications: Current Outpatient Medications  Medication Sig Dispense Refill  . cyanocobalamin (,VITAMIN  B-12,) 1000 MCG/ML injection Inject 1,000 mcg into the muscle every 30 (thirty) days.     . furosemide (LASIX) 20 MG tablet TAKE 2 TABLETS BY MOUTH EVERY DAY (Patient taking differently: Take 40 mg by mouth daily. ) 180 tablet 1  . HYDROcodone-acetaminophen (NORCO) 7.5-325 MG tablet Take 1 tablet by mouth every 4 (four) hours as needed.    . hydrOXYzine (ATARAX/VISTARIL) 10 MG tablet TAKE 1 TABLET (10 MG TOTAL) BY MOUTH 3 (THREE) TIMES DAILY AS NEEDED FOR ITCHING. 30 tablet 0  . lactulose (CHRONULAC) 10 GM/15ML solution Take 20 g by mouth 2 (two) times daily.    . metFORMIN (GLUCOPHAGE) 500 MG tablet Take 1,000 mg by mouth 2 (two) times daily with a meal.     . Misc. Devices KIT Moderate compression hose 20-30 MM HG 1 kit 0  . mupirocin ointment (BACTROBAN) 2 % mupirocin 2 % topical ointment    . OneTouch Delica Lancets 28B MISC 2 (two) times daily.    Glory Rosebush ULTRA test strip 2 (two) times daily.    . pantoprazole (PROTONIX) 40 MG tablet Take 40 mg by mouth 2 (two) times daily.    . potassium chloride (KLOR-CON 10) 10 MEQ tablet Take 1 tablet (10 mEq total) by mouth daily for 4 days. 4 tablet 0  . rifaximin (XIFAXAN) 550 MG TABS tablet TAKE 1 TABLET (550 MG TOTAL) BY MOUTH 2 (TWO) TIMES DAILY. 180 tablet 1  . spironolactone (ALDACTONE) 50 MG tablet Take 1 tablet (50 mg total) by mouth daily. 60 tablet 1  . VENTOLIN HFA 108 (90 Base) MCG/ACT inhaler SMARTSIG:2 Puff(s) By Mouth Every 4 Hours PRN    . Vitamin D, Ergocalciferol, (DRISDOL) 1.25 MG (50000 UT) CAPS capsule TAKE 1 CAPSULE BY MOUTH ONE TIME PER WEEK (Patient taking differently: Take 50,000 Units by mouth once a week. ) 12 capsule 0  . acetaminophen-codeine (TYLENOL #3) 300-30 MG tablet Take 1 tablet by mouth every 6 (six) hours as needed.    . fluconazole (DIFLUCAN) 150 MG tablet Take 150 mg by mouth daily.    Marland Kitchen nystatin (MYCOSTATIN/NYSTOP) powder Apply 1 g topically as needed (rash).     . ondansetron (ZOFRAN) 4 MG tablet ondansetron  HCl 4 mg tablet  TAKE 1 TABLET BY MOUTH EVERY 6 HOURS AS NEEDED     No current facility-administered medications for this visit.    Review of Systems  Constitutional: Negative.  Negative for diaphoresis, fever, malaise/fatigue and weight loss (up 7 pounds).       Feels "alright".  HENT: Negative.  Negative for congestion, ear pain, nosebleeds, sinus pain and sore throat.        Dry mouth.  Eyes: Negative.  Negative for blurred vision, double vision, photophobia and pain.  Respiratory: Negative.  Negative for cough, hemoptysis, sputum production and shortness of breath.   Cardiovascular: Negative.  Negative for chest pain, palpitations, orthopnea, leg swelling and PND.  Gastrointestinal: Negative  for abdominal pain, blood in stool, constipation, diarrhea (lactulose causes loose stools), heartburn, melena, nausea and vomiting.       Portal hypertensive gastropathy s/p variceal ligation.  Ice pica.  Eating well.   Genitourinary: Positive for dysuria (burning sensation). Negative for frequency, hematuria and urgency.  Musculoskeletal: Negative for back pain, falls, joint pain, myalgias and neck pain.  Skin: Positive for itching (back at night ; lower abdominal itch gone). Negative for rash.       Telangectasias on face and back.  Neurological: Negative.  Negative for dizziness, tremors, sensory change, speech change, focal weakness, seizures, weakness and headaches.  Endo/Heme/Allergies: Does not bruise/bleed easily.       Diabetes on Metformin.  Psychiatric/Behavioral: Negative.  Negative for depression and memory loss. The patient is not nervous/anxious and does not have insomnia.   All other systems reviewed and are negative.  Performance status (ECOG):  2  Vitals Blood pressure 111/63, temperature (!) 97.1 F (36.2 C), temperature source Tympanic, resp. rate 18, height 5' 3" (1.6 m), weight 178 lb 2.1 oz (80.8 kg).   Physical Exam  Constitutional: She is oriented to person, place,  and time. She appears well-developed and well-nourished. No distress.  HENT:  Head: Normocephalic and atraumatic.  Mouth/Throat: No oropharyngeal exudate.  Shoulder length gray hair.  Dry mouth.  Mask.  Eyes: Pupils are equal, round, and reactive to light. Conjunctivae and EOM are normal. No scleral icterus.  Glasses.  Blue eyes.  Neck: No JVD present.  Cardiovascular: Normal rate, regular rhythm and normal heart sounds.  No murmur heard. Pulmonary/Chest: Effort normal and breath sounds normal. No respiratory distress. She has no wheezes. She has no rales.  Abdominal: Soft. Bowel sounds are normal. She exhibits no distension. There is no abdominal tenderness. There is no rebound and no guarding.  Spleen tip palpable.  Musculoskeletal:        General: Edema (chronic 2+ BLE; dense) present. Normal range of motion.     Cervical back: Normal range of motion and neck supple.  Lymphadenopathy:    She has no cervical adenopathy.    She has no axillary adenopathy.       Right: No supraclavicular adenopathy present.       Left: No supraclavicular adenopathy present.  Neurological: She is alert and oriented to person, place, and time.  Skin: Skin is warm and dry. No rash noted. She is not diaphoretic. No erythema. No pallor.  Telangectasias face and upper back.  Spider angiomas.  Psychiatric: She has a normal mood and affect. Her behavior is normal. Judgment and thought content normal.  Nursing note and vitals reviewed.   No visits with results within 3 Day(s) from this visit.  Latest known visit with results is:  Hospital Outpatient Visit on 10/24/2019  Component Date Value Ref Range Status  . Creatinine, Ser 10/24/2019 1.10* 0.44 - 1.00 mg/dL Final    Assessment:  Crystal [name] is a 69 y.o. female with a right lower lobe pulmonary nodule. She denies any smoking history.  Abdomen and pelvic CTon 02/09/2018 revealed an 1.8 cm spiculated density in right lower lobe concerning for  malignancy. There was mildly nodular hepatic contourconcerning for hepatic cirrhosis. There was moderate splenomegalysuggesting portal venous hypertension. Mild ascites was noted. There were mildly enlarged retroperitoneal lymph nodes (largest 1.1 cm) concerning for possible metastatic disease or malignancy. There was a 7 cm left ovarian cyst. Further evaluation with MRI was recommended to evaluate for possible neoplasm. CA125was 351.3 and  CEA1.1 on 02/21/2018.   Chest CTon 02/25/2018 revealed a 2.3 x 1.5 x 1.0 cm perifissural nodulewith spiculated margins in the right middle lobe. There was a 1.5 x 1.5 x 1.4 cm right lower lobe spiculated nodule. There was a moderate left sided pleural effusion.   PET scanon 03/08/2018 revealed a 2.5 x 1.8 cm sub solid nodular opacity in theRML(SUV 2.7) and a 1.6 cm roundedslightly spiculated RLL pulmonary nodule(SUV 2.1). There were no enlarged or hypermetabolic mediastinal or hilar lymph nodes. There was a small left pleural effusion. 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  Chest CTon 05/15/2018 revealed no significant changes in the 2 previously noted nodules in the RIGHT lung. Irregular RML lesion measures 2.4 x 1.4 cm (previously 2.3 x 1.5 cm). Spiculated central RLL lesion measures 1.8 x 1.4 cm (previously 1.6 x 1.5 cm). Both areas remain concerning for neoplasm. There was interval decrease in ascites volume overall. Upper abdominal lymphadenopathy noted that was felt to be reactive due to underlying liver disease.   Chest CTon 08/15/2018 revealed the spiculated right lower lobe lung lesion appeared slightly increased and was not significantly changed in size compared with previous exam. Morphologically, this was worrisome for a small bronchogenic neoplasm. The right middle lobe lung nodule was slightly decreased in size in the interval and may be  post infectious or inflammatory in etiology.  Super D chest CTon 01/24/2019 revealed a 3.2 x 2.3 cm ill-defined anterior right lung nodule compared to 2.9 x 2.3 cm previously. Minor fissurewasnot well, and as such, this nodule can not be confidently localized to the right upper or middle lobe. The right lower lobe nodule measured2.0 x 2.0 cm comparedto 2.0 x 1.9 cm.  Chest CT on 08/07/2019 revealed no significant interval change in size right middle lobe and right lower lobe lung nodules. Liver was compatible with cirrhosis s/pTIPS.  She was admitted to [facility] from 02/09/2018 - 04/02/2019with a variceal hemorrhage. EGDon 02/10/2018 revealed 5 columns of grade III esophageal varices in the lower third of the esophagus. There was stigmata of recent bleeding and red wale signs. She underwent variceal ligation x 10. She required 3 units of PRBCs.  EGDon 03/25/2018 revealed 2 angiectasias(non-bleeding) in the second portion of the duodenum, and a few diminutive (non-bleeding) angiectasias in the prepyloric region of the stomach that were treated APC. There were 4 non-bleeding ulcers (Forrest Class III) found in the gastric fundus. There were 4 columns of large non-bleeding varicesin the lower third of the esophagus, of which demonstrated no stigmata of bleeding. Varices were banded. EGD on 04/22/2018 revealed one non-bleeding cratered gastric ulcer(Forrest Class III) in the lesser curvature of the gastric body. Duodenal bulb and second portion of the duodenum were normal. Moderate portal hypertensive gastropathy noted in the stomach. Large varices in the lower third of the esophagus noted. 2 bands were placed with incomplete eradication of the lesions.   Colonoscopyon 03/25/2018 revealed a single 5 mm sessile polyp in the ascending colon. Patient has rectal varices and external hemorrhoids. Pathology returned as tubular adenoma, and was negative for high grade dysplasia and malignancy.     She has iron deficiency anemia. Ferritinwas 6. She is on ferrous sulfate 325 mg TID. She hasB12 deficiency. B12was 103. Intrinsic factor antibody was negative. 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 She began B12 injections on 03/06/2018 (last 11/24/2019). Folate was12.3on 10/27/2019. B12 was 485 on 05/24/2018. Dietis good.  She received Venoferweekly  x 4 (07/12/2018 - 07/31/2018).   Ferritinhas been followed: 6 on 02/09/2018, 16 on 05/24/2018, 13 on 07/10/2018, 374 on 08/15/2018, 485 on 08/21/2018, 114 on 09/25/2018, 63 on 11/20/2018, 35 on 02/12/2019, 22 on 04/24/2019, 41 on 09/22/2019, 41 on 10/27/2019, and 21 on 12/15/2019.  She has hepatitis C antibodies. HCV RNA was not detected. She has been exposed to hepatitis B. Anti-smooth muscle antibodiesare weakly positive. ANAwas positive (>1:1280 centromere antibody). Negative studiesincluded: ceruloplasmin, anti-mitochondrial antibodies, hepatitis B IgM, HIV, and alpha-1 antitrypsin. PT was 15.2 (INR 1.21). AFPwas 1.5 on 05/28/2018,2.4 on 11/20/2018, and 2.0 on 04/24/2019.  She has a history of decompensated cirrhosiss/p TIPSprocedure 08/07/2018. She has portal hypertension and a history of recurrent bleeding esophageal varices.She was given a 2-year life expectancyfollowing her TIPS procedure. Abdominal/pelvic arterial/venous ultrasound doppleron 04/24/2019 revealed a patent TIPS and no evidence of persisting ascites.  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 There was no biliary duct dilatation. There was cholelithiasis without acute cholecystitis. Right hepatic lobe 9 mm lesion which was indeterminate, LR 3. This was possibly present on the prior CT of 2019.  Recommendation included repeat pre and post contrast abdominal MRI at 3-6 months. There as small volume abdominal ascites. The right lower lobe pulmonary nodule was  suboptimally evaluated.  She was admitted to [facility] from 08/20/2019 to 08/22/2019 with altered mental status. Head CT revealed no acute intracranial pathology. Head MRI wo contrast showed symmetric FLAIR hyperintensity within the bilateral mamillary bodies. This finding may be seen in the setting of Wernicke encephalopathy.  There was no evidence of acute infarct and mild chronic small vessels with ischemic disease.  Her mental status returned to baseline.  RUQ ultrasoundon 05/29/2018 revealed cholelithiasis. There was a thickened edematous gallbladder wall. The wall thickening could be due to hypoproteinemia/hypoalbuminemia or cholecystitis. Negative sonographic Murphy's sign. There was a heterogeneous slightly nodular liver consistent with cirrhosis.  Pelvic ultrasoundon 03/18/2018 demonstrated a simple cyst in the LEFT ovary measuring 6.1 x 4.5 x 6.6 cm. Repeat imaging recommended in 1 year to document stability.  Symptomatically, she feels "alright".  She denies any melena or hematochezia.  Hemoglobin is 10.1.  Ferritin is 21.  Plan: 1.   Review [facility] labs. 2.Decompensated cirrhosis s/p TIPS Clinically, she is doing fairly well. Life expectancy is limited s/pTIPS (2 years). She denies any excess bruising or bleeding.   AFP was 2.0 on06/09/2019. Patient is monitored closely by GI. 3.Pulmonary nodules Chest CT on 08/07/2019 revealed no significant interval change in size RML and RLL lung nodules.              Discuss plan for 6 month follow-up.   Patient in agreement.  4.Iron deficiency anemia Hematocrit29.7. Hemoglobin 10.1. MCV 83on 12/15/2019. Ferritinwas 21 with a sed rate of 50 on 12/15/2019. IV iron if ferritin < 30. Venofer today and weekly x 2 (total 3). Continue [facility] labs. 5.B12 deficiency Patient receives B12 monthly (last 11/24/2019)             Continue B12 monthly. 6.   Left ovarian cyst             Pelvic ultrasound  on 03/18/2018 revealed a simple cyst in the left ovary (6.1 x 4.5 x 6.6 cm).             Discuss initial plan for follow-up imaging in 1 year (late).  Patient declines follow-up. 7.   Dysuria  UA and culture. 8.   Chest CT in 02/02/2020  for f/u lung nodules. 9.   RTC after chest CT for MD assessment, [facility] labs (CBC with diff, ferritin, iron studies- day before), review of chest CT and +/- Venofer.  Addendum:  UA and culture revealed > 100,000 colonies Serratia marcescens sensitive to ceftriaxone, ciprofloxacin, gentamicin and Septra.  Dr. Gala Murdoch office contacted and plans to initiate ciprofloxacin.  I discussed the assessment and treatment plan with the patient.  The patient was provided an opportunity to ask questions and all were answered.  The patient agreed with the plan and demonstrated an understanding of the instructions.  The patient was advised to call back if the symptoms worsen or if the condition fails to improve as anticipated.   Lequita Asal, MD, PhD    12/22/2019, 1:18 PM  I, Selena Batten, am acting as scribe for [employer]. Mike Gip, MD, PhD.  I, Nakenya Theall C. Mike Gip, MD, have reviewed the above documentation for accuracy and completeness, and I agree with the above.

## 2019-12-22 ENCOUNTER — Other Ambulatory Visit: Payer: Self-pay

## 2019-12-22 ENCOUNTER — Inpatient Hospital Stay: Payer: Medicare HMO

## 2019-12-22 ENCOUNTER — Encounter: Payer: Self-pay | Admitting: Hematology and Oncology

## 2019-12-22 ENCOUNTER — Inpatient Hospital Stay: Payer: Medicare HMO | Attending: Hematology and Oncology | Admitting: Hematology and Oncology

## 2019-12-22 VITALS — BP 111/63 | HR 65 | Temp 97.1°F | Resp 18 | Ht 63.0 in | Wt 178.1 lb

## 2019-12-22 VITALS — BP 110/68 | HR 62 | Temp 97.6°F | Resp 17

## 2019-12-22 DIAGNOSIS — R188 Other ascites: Secondary | ICD-10-CM | POA: Diagnosis not present

## 2019-12-22 DIAGNOSIS — Z7189 Other specified counseling: Secondary | ICD-10-CM

## 2019-12-22 DIAGNOSIS — R918 Other nonspecific abnormal finding of lung field: Secondary | ICD-10-CM

## 2019-12-22 DIAGNOSIS — R300 Dysuria: Secondary | ICD-10-CM | POA: Insufficient documentation

## 2019-12-22 DIAGNOSIS — E538 Deficiency of other specified B group vitamins: Secondary | ICD-10-CM | POA: Diagnosis not present

## 2019-12-22 DIAGNOSIS — D500 Iron deficiency anemia secondary to blood loss (chronic): Secondary | ICD-10-CM | POA: Insufficient documentation

## 2019-12-22 DIAGNOSIS — K7460 Unspecified cirrhosis of liver: Secondary | ICD-10-CM | POA: Diagnosis not present

## 2019-12-22 LAB — URINALYSIS, COMPLETE (UACMP) WITH MICROSCOPIC
Bilirubin Urine: NEGATIVE
Glucose, UA: NEGATIVE mg/dL
Ketones, ur: NEGATIVE mg/dL
Nitrite: NEGATIVE
Protein, ur: NEGATIVE mg/dL
Specific Gravity, Urine: 1.02 (ref 1.005–1.030)
WBC, UA: >50 WBC/hpf (ref 0–5)
pH: 5 (ref 5.0–8.0)

## 2019-12-22 MED ORDER — SODIUM CHLORIDE 0.9 % IV SOLN
Freq: Once | INTRAVENOUS | Status: AC
  Filled 2019-12-22: qty 250

## 2019-12-22 MED ORDER — IRON SUCROSE 20 MG/ML IV SOLN
200.0000 mg | Freq: Once | INTRAVENOUS | Status: AC
  Administered 2019-12-22: 200 mg via INTRAVENOUS

## 2019-12-22 NOTE — Progress Notes (Signed)
No new changes noted today

## 2019-12-22 NOTE — Patient Instructions (Signed)

## 2019-12-24 ENCOUNTER — Telehealth: Payer: Self-pay

## 2019-12-24 NOTE — Telephone Encounter (Signed)
The patient had a urine done on 12/22/2019 and wanted to know her results. I have inform her i will have Dr Mike Gip reveiw her lab results and i will inform her first thing in the AM what she would like to do. The patient was understanding and agreeable.

## 2019-12-25 ENCOUNTER — Telehealth: Payer: Self-pay

## 2019-12-25 ENCOUNTER — Other Ambulatory Visit: Payer: Self-pay

## 2019-12-25 ENCOUNTER — Other Ambulatory Visit: Payer: Self-pay | Admitting: Interventional Radiology

## 2019-12-25 DIAGNOSIS — I8510 Secondary esophageal varices without bleeding: Secondary | ICD-10-CM

## 2019-12-25 DIAGNOSIS — K7460 Unspecified cirrhosis of liver: Secondary | ICD-10-CM

## 2019-12-25 LAB — URINE CULTURE: Culture: >=100000 — AB

## 2019-12-25 NOTE — Telephone Encounter (Signed)
-----  Message from Lequita Asal, MD sent at 12/25/2019  1:27 PM EST ----- Regarding: Please call her PCP on the phone  She presented with UTI symptoms. Urine culture revealed serratia marcescens.  Consider treatment with: Cipro 250 mg BID x 3 days or  Septra DS 1 tablet BID x 3 days.  We see her for iron deficiency and B12 deficiency.  Would he like [place] to treat and/or have a preference?  M ----- Message ----- From: Buel Ream, Lab In [place] Sent: 12/22/2019   2:02 PM EST To: Lequita Asal, MD

## 2019-12-25 NOTE — Telephone Encounter (Signed)
spoke with the patient PCP office to infrom them that this patient has a UTI and need to be treated. Ms Crystal [name] just informed me that they will treat her if i will fax the results over to there office. Lab results has been faxed to [facility] office. Fax conformation confirmed.

## 2019-12-26 ENCOUNTER — Telehealth: Payer: Self-pay

## 2019-12-26 ENCOUNTER — Other Ambulatory Visit: Payer: Self-pay

## 2019-12-26 ENCOUNTER — Ambulatory Visit: Payer: Medicare HMO | Attending: Internal Medicine

## 2019-12-26 DIAGNOSIS — Z23 Encounter for immunization: Secondary | ICD-10-CM | POA: Insufficient documentation

## 2019-12-26 NOTE — Telephone Encounter (Signed)
Spoke with the Nurse at PCP office to inform them that she had a positive UTI and how would they like to handle this problem. I was informed by nurse Juliann Pulse to fax results over to there office and they will begain to treat the patient.

## 2019-12-26 NOTE — Telephone Encounter (Signed)
Spoke with Ms Crystal [name] at the patient PCP office and they did contact the patient and she giving Cipro 500 mg 1 tab po BID x 10 days. Ms Crystal [name] reports the patient has picked up the medication.

## 2019-12-26 NOTE — Progress Notes (Signed)
Covid-19 Vaccination Clinic  Name:  Crystal [name]    MRN: [id number] DOB: [birth year]  12/26/2019  Crystal [name] was observed post Covid-19 immunization for 15 minutes without incidence. She was provided with Vaccine Information Sheet and instruction to access the V-Safe system.   Crystal [name] was instructed to call 911 with any severe reactions post vaccine: Marland Kitchen Difficulty breathing  . Swelling of your face and throat  . A fast heartbeat  . A bad rash all over your body  . Dizziness and weakness    Immunizations Administered    Name Date Dose VIS Date Route   Pfizer COVID-19 Vaccine 12/26/2019 11:32 AM 0.3 mL 10/24/2019 Intramuscular   Manufacturer: [place]   Lot: [id number]   [place]: [address]-3

## 2019-12-29 ENCOUNTER — Inpatient Hospital Stay: Payer: Medicare HMO

## 2019-12-29 ENCOUNTER — Other Ambulatory Visit: Payer: Self-pay

## 2019-12-29 VITALS — BP 108/67 | HR 67 | Temp 98.0°F | Resp 18

## 2019-12-29 DIAGNOSIS — R300 Dysuria: Secondary | ICD-10-CM | POA: Diagnosis not present

## 2019-12-29 DIAGNOSIS — D500 Iron deficiency anemia secondary to blood loss (chronic): Secondary | ICD-10-CM | POA: Diagnosis not present

## 2019-12-29 DIAGNOSIS — E538 Deficiency of other specified B group vitamins: Secondary | ICD-10-CM

## 2019-12-29 MED ORDER — IRON SUCROSE 20 MG/ML IV SOLN
200.0000 mg | Freq: Once | INTRAVENOUS | Status: AC
  Administered 2019-12-29: 200 mg via INTRAVENOUS

## 2019-12-29 MED ORDER — SODIUM CHLORIDE 0.9 % IV SOLN
Freq: Once | INTRAVENOUS | Status: AC
  Filled 2019-12-29: qty 250

## 2019-12-29 NOTE — Patient Instructions (Signed)

## 2019-12-31 ENCOUNTER — Ambulatory Visit (INDEPENDENT_AMBULATORY_CARE_PROVIDER_SITE_OTHER): Payer: Medicare HMO | Admitting: Internal Medicine

## 2019-12-31 ENCOUNTER — Other Ambulatory Visit: Payer: Self-pay | Admitting: Gastroenterology

## 2019-12-31 ENCOUNTER — Other Ambulatory Visit: Payer: Self-pay

## 2019-12-31 ENCOUNTER — Encounter: Payer: Self-pay | Admitting: Internal Medicine

## 2019-12-31 VITALS — BP 110/52 | HR 68 | Ht 63.0 in | Wt 180.2 lb

## 2019-12-31 DIAGNOSIS — K7460 Unspecified cirrhosis of liver: Secondary | ICD-10-CM | POA: Diagnosis not present

## 2019-12-31 DIAGNOSIS — I2510 Atherosclerotic heart disease of native coronary artery without angina pectoris: Secondary | ICD-10-CM

## 2019-12-31 DIAGNOSIS — I700 Atherosclerosis of aorta: Secondary | ICD-10-CM | POA: Diagnosis not present

## 2019-12-31 DIAGNOSIS — I5032 Chronic diastolic (congestive) heart failure: Secondary | ICD-10-CM | POA: Diagnosis not present

## 2019-12-31 DIAGNOSIS — K7290 Hepatic failure, unspecified without coma: Secondary | ICD-10-CM

## 2019-12-31 DIAGNOSIS — R188 Other ascites: Secondary | ICD-10-CM

## 2019-12-31 NOTE — Progress Notes (Signed)
Follow-up Outpatient Visit Date: 12/31/2019  Primary Care Provider: [facility] [address], [place] [address]  Chief Complaint: Leg swelling  HPI:  Crystal [name] is a 69 y.o. 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  I met her last spring (03/2019), at which time she endorsed minimal leg swelling but otherwise no symptoms such as chest pain, shortness of breath, and orthopnea.  She noted fatigue with moderate activity.  Subsequent echo showed normal LVEF and right ventricle.  Left atrium was mildly dilated.  Today, Crystal [name] reports stable dyspnea on exertion.  She notes mild leg swelling today but otherwise had had minimal edema, which she controls with taking an extra dose of furosemide as needed.  She has gained ~6 pounds over the last few weeks.  She denies orthopnea, chest pain, palpitations, and lightheadedness.  Fatigue has improved with iron supplementation.  --------------------------------------------------------------------------------------------------  Cardiovascular History & Procedures: Cardiovascular Problems:  Diastolic heart failure  Risk Factors:  Diabetes mellitus, family history, and obesity  Cath/PCI:  None  CV Surgery:  None  EP Procedures and Devices:  None  Non-Invasive Evaluation(s):  TTE (04/04/2019): Normal LV size with mild LVH.  LVEF 55-6% with grade 1 diastolic dysfunction.  Normal RV size and function.  Mild left atrial enlargement.  Borderline dilated aortic root.  Unable to assess PA pressure.  TTE (08/05/2018): Normal LV size and wall thickness.  LVEF 60-65% with normal wall motion.  Grade 1 diastolic dysfunction.  Moderate to severe left atrial enlargement.  Mild right atrial  enlargement.  Normal RV size and function.  Normal pulmonary artery pressure.  Recent CV Pertinent Labs: Lab Results  Component Value Date   TRIG 163 (H) 08/03/2018   INR 1.1 06/16/2019   K 3.7 08/22/2019   MG 1.8 08/21/2019   BUN 13 08/21/2019   BUN 12 01/22/2019   CREATININE 1.10 (H) 10/24/2019   CREATININE 1.18 (H) 06/16/2019    Past medical and surgical history were reviewed and updated in EPIC.  Current Meds  Medication Sig  . ciprofloxacin (CIPRO) 500 MG tablet Take 500 mg by mouth 2 (two) times daily.  . cyanocobalamin (,VITAMIN B-12,) 1000 MCG/ML injection Inject 1,000 mcg into the muscle every 30 (thirty) days.   . fluconazole (DIFLUCAN) 150 MG tablet Take 150 mg by mouth daily.  . furosemide (LASIX) 20 MG tablet TAKE 2 TABLETS BY MOUTH EVERY DAY (Patient taking differently: Take 40 mg by mouth daily. )  . HYDROcodone-acetaminophen (NORCO) 7.5-325 MG tablet Take 1 tablet by mouth every 4 (four) hours as needed.  . hydrOXYzine (ATARAX/VISTARIL) 10 MG tablet TAKE 1 TABLET (10 MG TOTAL) BY MOUTH 3 (THREE) TIMES DAILY AS NEEDED FOR ITCHING.  . lactulose (CHRONULAC) 10 GM/15ML solution Take 20 g by mouth 2 (two) times daily.  . metFORMIN (GLUCOPHAGE) 500 MG tablet Take 1,000 mg by mouth 2 (two) times daily with a meal.   . Misc. Devices KIT Moderate compression hose 20-30 MM HG  . mupirocin ointment (BACTROBAN) 2 % mupirocin 2 % topical ointment  . nystatin (MYCOSTATIN/NYSTOP) powder Apply 1 g topically as needed (rash).   . ondansetron (ZOFRAN) 4 MG tablet ondansetron HCl 4 mg tablet  TAKE 1 TABLET BY MOUTH EVERY 6 HOURS AS NEEDED  . OneTouch Delica Lancets 60F MISC 2 (  two) times daily.  Glory Rosebush ULTRA test strip 2 (two) times daily.  . pantoprazole (PROTONIX) 40 MG tablet Take 40 mg by mouth 2 (two) times daily.  . potassium chloride (KLOR-CON 10) 10 MEQ tablet Take 1 tablet (10 mEq total) by mouth daily for 4 days.  . rifaximin (XIFAXAN) 550 MG TABS tablet TAKE 1 TABLET  (550 MG TOTAL) BY MOUTH 2 (TWO) TIMES DAILY.  Marland Kitchen spironolactone (ALDACTONE) 50 MG tablet Take 1 tablet (50 mg total) by mouth daily.  . VENTOLIN HFA 108 (90 Base) MCG/ACT inhaler SMARTSIG:2 Puff(s) By Mouth Every 4 Hours PRN  . Vitamin D, Ergocalciferol, (DRISDOL) 1.25 MG (50000 UT) CAPS capsule TAKE 1 CAPSULE BY MOUTH ONE TIME PER WEEK (Patient taking differently: Take 50,000 Units by mouth once a week. )    Allergies: Patient has no known allergies.  Social History   Tobacco Use  . Smoking status: Never Smoker  . Smokeless tobacco: Never Used  Substance Use Topics  . Alcohol use: Not Currently    Comment: No EtOH for 30 years.  Never a heavy drinker.  . Drug use: Never    Family History  Problem Relation Age of Onset  . CAD Father   . Heart attack Father 68  . Cancer Maternal Uncle   . Seizures Mother     Review of Systems: A 12-system review of systems was performed and was negative except as noted in the HPI.  --------------------------------------------------------------------------------------------------  Physical Exam: BP (!) 110/52 (BP Location: Left Arm, Patient Position: Sitting, Cuff Size: Normal)   Pulse 68   Ht 5' 3" (1.6 m)   Wt 180 lb 4 oz (81.8 kg)   LMP  (LMP Unknown)   BMI 31.93 kg/m   General:  NAD. Neck: No JVD or HJR. Lungs: CTA bilaterally.  No wheezes or crackles. Heart: RRR with 2/6 systolic murmur.  No rubs or gallops. Abd: Soft, NT/ND. Ext: Trace pretibial edema bilaterally.  EKG:  NSR with low voltage, nonspecific T wave changes, and mild QT prolongation.  Compared with prior tracings, non-specific T wave changes are now present.  R wave progression has normalized.  Lab Results  Component Value Date   WBC 3.6 (L) 08/21/2019   HGB 12.4 08/21/2019   HCT 36.6 08/21/2019   MCV 83.2 08/21/2019   PLT 111 (L) 08/21/2019    Lab Results  Component Value Date   NA 138 08/21/2019   K 3.7 08/22/2019   CL 104 08/21/2019   CO2 24 08/21/2019    BUN 13 08/21/2019   CREATININE 1.10 (H) 10/24/2019   GLUCOSE 75 08/21/2019   ALT 24 08/20/2019    Lab Results  Component Value Date   TRIG 163 (H) 08/03/2018    --------------------------------------------------------------------------------------------------  ASSESSMENT AND PLAN: Chronic HFpEF: Crystal [name] reports relatively stable symptoms, though she notes recent mild weight gain and leg edema.  I think it would be reasonable for her to increase furosemide to 60 mg daily until her weight has returned to baseline, at which time she can return to 40 mg daily.  Continue current dose of spironolactone.  Coronary artery disease and aortic atherosclerosis: Incidentally noted on prior CT's.  Crystal [name] does not have symptoms to suggest obstructive CAD.  EKG today notable for nonspecific T wave changes.  Echo in 03/2019 showed normal LVEF without wall motion abnormality.  We will defer ischemia evaluation unless symptoms worsen, as Crystal [name] remains a suboptimal cath/PCI candidate due to liver disease and prior variceal  bleed.  Cirrhosis: We will increase furosemide, as above.  Otherwise, no medication changes.  Continue follow-up with GI.  Follow-up: Return to clinic in 4 months.  Nelva Bush, MD 12/31/2019 3:07 PM

## 2019-12-31 NOTE — Patient Instructions (Addendum)
Medication Instructions:  Your physician has recommended you make the following change in your medication:  1- INCREASE Furosemide to 60 mg (3 tablets) by mouth daily until weight goes back down to your baseline normal, THEN go back to Furosemide 40 mg daily.  *If you need a refill on your cardiac medications before your next appointment, please call your pharmacy*  Lab Work: none If you have labs (blood work) drawn today and your tests are completely normal, you will receive your results only by: Marland Kitchen MyChart Message (if you have MyChart) OR . A paper copy in the mail If you have any lab test that is abnormal or we need to change your treatment, we will call you to review the results.  Testing/Procedures: none  Follow-Up: At [facility], you and your health needs are our priority.  As part of our continuing mission to provide you with exceptional heart care, we have created designated Provider Care Teams.  These Care Teams include your primary Cardiologist (physician) and Advanced Practice Providers (APPs -  Physician Assistants and Nurse Practitioners) who all work together to provide you with the care you need, when you need it.  Your next appointment:   4 month(s)  The format for your next appointment:   In Person  Provider:    You may see DR Harrell Gave END or one of the following Advanced Practice Providers on your designated Care Team:    Murray Hodgkins, NP  Christell Faith, PA-C  Marrianne Mood, PA-C

## 2020-01-02 ENCOUNTER — Encounter: Payer: Self-pay | Admitting: Internal Medicine

## 2020-01-02 ENCOUNTER — Other Ambulatory Visit: Payer: Self-pay

## 2020-01-05 ENCOUNTER — Inpatient Hospital Stay: Payer: Medicare HMO

## 2020-01-05 ENCOUNTER — Other Ambulatory Visit: Payer: Self-pay

## 2020-01-05 VITALS — BP 103/62 | HR 64 | Temp 97.1°F | Resp 18

## 2020-01-05 DIAGNOSIS — E538 Deficiency of other specified B group vitamins: Secondary | ICD-10-CM | POA: Diagnosis not present

## 2020-01-05 DIAGNOSIS — D500 Iron deficiency anemia secondary to blood loss (chronic): Secondary | ICD-10-CM | POA: Diagnosis not present

## 2020-01-05 DIAGNOSIS — R300 Dysuria: Secondary | ICD-10-CM | POA: Diagnosis not present

## 2020-01-05 MED ORDER — IRON SUCROSE 20 MG/ML IV SOLN
200.0000 mg | Freq: Once | INTRAVENOUS | Status: AC
  Administered 2020-01-05: 200 mg via INTRAVENOUS
  Filled 2020-01-05: qty 10

## 2020-01-05 MED ORDER — SODIUM CHLORIDE 0.9 % IV SOLN
Freq: Once | INTRAVENOUS | Status: AC
  Filled 2020-01-05: qty 250

## 2020-01-05 MED ORDER — CYANOCOBALAMIN 1000 MCG/ML IJ SOLN
1000.0000 ug | Freq: Once | INTRAMUSCULAR | Status: AC
  Administered 2020-01-05: 1000 ug via INTRAMUSCULAR

## 2020-01-05 NOTE — Telephone Encounter (Signed)
Last office visit 12/17/2019 encephalopathy Last refill 11/30/2019

## 2020-01-05 NOTE — Telephone Encounter (Signed)
Patient called & need a refill on lactulose (CHRONULAC) 10 GM/15ML solution called into the [facility] in [place].

## 2020-01-05 NOTE — Patient Instructions (Signed)

## 2020-01-06 ENCOUNTER — Ambulatory Visit: Payer: Medicare HMO | Admitting: Hematology and Oncology

## 2020-01-06 ENCOUNTER — Ambulatory Visit: Payer: Medicare HMO

## 2020-01-07 DIAGNOSIS — R69 Illness, unspecified: Secondary | ICD-10-CM | POA: Diagnosis not present

## 2020-01-09 DIAGNOSIS — R69 Illness, unspecified: Secondary | ICD-10-CM | POA: Diagnosis not present

## 2020-01-09 DIAGNOSIS — E261 Secondary hyperaldosteronism: Secondary | ICD-10-CM | POA: Diagnosis not present

## 2020-01-09 DIAGNOSIS — J449 Chronic obstructive pulmonary disease, unspecified: Secondary | ICD-10-CM | POA: Diagnosis not present

## 2020-01-09 DIAGNOSIS — E1151 Type 2 diabetes mellitus with diabetic peripheral angiopathy without gangrene: Secondary | ICD-10-CM | POA: Diagnosis not present

## 2020-01-09 DIAGNOSIS — I110 Hypertensive heart disease with heart failure: Secondary | ICD-10-CM | POA: Diagnosis not present

## 2020-01-09 DIAGNOSIS — I509 Heart failure, unspecified: Secondary | ICD-10-CM | POA: Diagnosis not present

## 2020-01-09 DIAGNOSIS — K7290 Hepatic failure, unspecified without coma: Secondary | ICD-10-CM | POA: Diagnosis not present

## 2020-01-09 DIAGNOSIS — E669 Obesity, unspecified: Secondary | ICD-10-CM | POA: Diagnosis not present

## 2020-01-09 DIAGNOSIS — E1165 Type 2 diabetes mellitus with hyperglycemia: Secondary | ICD-10-CM | POA: Diagnosis not present

## 2020-01-09 DIAGNOSIS — Z008 Encounter for other general examination: Secondary | ICD-10-CM | POA: Diagnosis not present

## 2020-01-09 DIAGNOSIS — K7460 Unspecified cirrhosis of liver: Secondary | ICD-10-CM | POA: Diagnosis not present

## 2020-01-15 DIAGNOSIS — D500 Iron deficiency anemia secondary to blood loss (chronic): Secondary | ICD-10-CM | POA: Diagnosis not present

## 2020-01-16 ENCOUNTER — Other Ambulatory Visit: Payer: Self-pay | Admitting: Gastroenterology

## 2020-01-19 ENCOUNTER — Encounter: Payer: Self-pay | Admitting: Hematology and Oncology

## 2020-01-19 ENCOUNTER — Ambulatory Visit: Payer: Medicare HMO

## 2020-01-19 NOTE — Telephone Encounter (Signed)
Last office visit 12/17/2019 Encephalopathy  Last refill 08/11/2019 1 refills

## 2020-01-20 ENCOUNTER — Ambulatory Visit
Admission: RE | Admit: 2020-01-20 | Discharge: 2020-01-20 | Disposition: A | Discharge status: Home / Self Care | Payer: Medicare HMO | Source: Ambulatory Visit | Attending: Interventional Radiology | Admitting: Interventional Radiology

## 2020-01-20 ENCOUNTER — Other Ambulatory Visit: Payer: Self-pay

## 2020-01-20 DIAGNOSIS — K7460 Unspecified cirrhosis of liver: Secondary | ICD-10-CM | POA: Diagnosis not present

## 2020-01-20 DIAGNOSIS — Z9689 Presence of other specified functional implants: Secondary | ICD-10-CM | POA: Diagnosis not present

## 2020-01-20 DIAGNOSIS — I8510 Secondary esophageal varices without bleeding: Secondary | ICD-10-CM | POA: Diagnosis not present

## 2020-01-21 ENCOUNTER — Ambulatory Visit: Payer: Medicare HMO | Attending: Internal Medicine

## 2020-01-21 DIAGNOSIS — Z23 Encounter for immunization: Secondary | ICD-10-CM | POA: Insufficient documentation

## 2020-01-21 NOTE — Progress Notes (Signed)
Covid-19 Vaccination Clinic  Name:  Crystal [name]    MRN: [id number] DOB: 1951/10/10  01/21/2020  Ms. Felch was observed post Covid-19 immunization for 15 minutes without incident. She was provided with Vaccine Information Sheet and instruction to access the V-Safe system.   Ms. Bucklin was instructed to call 911 with any severe reactions post vaccine: Marland Kitchen Difficulty breathing  . Swelling of face and throat  . A fast heartbeat  . A bad rash all over body  . Dizziness and weakness   Immunizations Administered    Name Date Dose VIS Date Route   Pfizer COVID-19 Vaccine 01/21/2020  9:34 AM 0.3 mL 10/24/2019 Intramuscular   Manufacturer: [employer]   Lot: [id number]   [place]: [id number]

## 2020-01-22 ENCOUNTER — Ambulatory Visit
Admission: RE | Admit: 2020-01-22 | Discharge: 2020-01-22 | Disposition: A | Discharge status: Home / Self Care | Payer: Medicare HMO | Source: Ambulatory Visit | Attending: Interventional Radiology | Admitting: Interventional Radiology

## 2020-01-22 ENCOUNTER — Other Ambulatory Visit: Payer: Self-pay

## 2020-01-22 ENCOUNTER — Encounter: Payer: Self-pay | Admitting: *Deleted

## 2020-01-22 DIAGNOSIS — K766 Portal hypertension: Secondary | ICD-10-CM | POA: Diagnosis not present

## 2020-01-22 DIAGNOSIS — I8510 Secondary esophageal varices without bleeding: Secondary | ICD-10-CM

## 2020-01-22 DIAGNOSIS — K7460 Unspecified cirrhosis of liver: Secondary | ICD-10-CM

## 2020-01-22 HISTORY — PX: IR RADIOLOGIST EVAL & MGMT: IMG5224

## 2020-01-22 NOTE — Progress Notes (Signed)
Chief Complaint: Portal HTN, TIPS  Referring Physician(s): Dr. Marius Ditch, GI Dr. Lorelee Market, PCP, [place], [place] Dr. Mike Gip, Oncology  History of Present Illness: Crystal [name] is a 69 y.o. female presenting today as a scheduled follow up to [place] [facility], SP TIPS.  She joins [place] via telemedicine visit given COVID.  I confirmed her identity with 2 identifiers.   She is a very pleasant 69 year old female with history of cirrhosis and HCV, SP TIPS performed emergently as inpt, 08/07/2018.  The indication was recurrent EV bleeding, refractory to banding.   She tells me that she feels tired when she does a lot of activity, but otherwise continues to do well at home. She has not had recent hospitalization.  She denies any hematemesis or melena.  She has upcoming appt with Dr. Marius Ditch next month.    She has not had any need for paracentesis.    She continues to complete all of her ADL's comfortably and completely.    She tells me that she just received her second dose of the COVID vaccine, she believes [employer].   TIPS duplex was performed 2 days ago on 01/20/20, with patent TIPS, and no concerns.   She has not had any episodes of hepatic encephalopathy. She continues to take lactulose 2x daily.      Past Medical History:  Diagnosis Date  . Acute GI bleeding 02/09/2018  . Acute upper gastrointestinal bleeding 08/03/2018  . Anemia    TAKES IRON TAB  . Arthritis    SHOULDER  . B12 deficiency 02/18/2018  . Bleeding    GI  3/19  . Bronchitis    HX OF  . Cirrhosis ([place])   . Colon cancer screening   . Diabetes mellitus without complication ([place])    TYPE 2  . Fatty (change of) liver, not elsewhere classified   . Full dentures    UPPER AND LOWER  . Iron deficiency anemia due to chronic blood loss 02/18/2018  . Lung nodule, multiple   . Pernicious anemia 02/22/2018  . PUD (peptic ulcer disease)   . Raynaud's syndrome   . Upper GI bleeding 08/03/2018    Past Surgical History:    Procedure Laterality Date  . CATARACT EXTRACTION W/PHACO Right 02/06/2018   Procedure: CATARACT EXTRACTION PHACO AND INTRAOCULAR LENS PLACEMENT ([place]) RIGHT DIABETIC;  Surgeon: Leandrew Koyanagi, MD;  Location: [facility];  Service: Ophthalmology;  Laterality: Right;  DIABETIC, ORAL MED  . CATARACT EXTRACTION W/PHACO Left 04/30/2018   Procedure: CATARACT EXTRACTION PHACO AND INTRAOCULAR LENS PLACEMENT (IOC);  Surgeon: Leandrew Koyanagi, MD;  Location: [facility];  Service: Ophthalmology;  Laterality: Left;  [place] 01:04 AP% 21.3 CDE 13.66 Fluid pack lot # [id number]  . COLONOSCOPY WITH PROPOFOL N/A 03/25/2018   Procedure: COLONOSCOPY WITH PROPOFOL;  Surgeon: Lin Landsman, MD;  Location: [facility] ENDOSCOPY;  Service: Gastroenterology;  Laterality: N/A;  . DENTAL SURGERY     EXTRACTIONS  . ELECTROMAGNETIC NAVIGATION BROCHOSCOPY Right 01/27/2019   Procedure: ELECTROMAGNETIC NAVIGATION BRONCHOSCOPY;  Surgeon: [name], MD;  Location: [facility];  Service: Cardiopulmonary;  Laterality: Right;  . ESOPHAGOGASTRODUODENOSCOPY N/A 08/03/2018   Procedure: ESOPHAGOGASTRODUODENOSCOPY (EGD);  Surgeon: Lin Landsman, MD;  Location: [facility] ENDOSCOPY;  Service: Gastroenterology;  Laterality: N/A;  . ESOPHAGOGASTRODUODENOSCOPY (EGD) WITH PROPOFOL N/A 02/10/2018   Procedure: ESOPHAGOGASTRODUODENOSCOPY (EGD) WITH PROPOFOL;  Surgeon: Jonathon Bellows, MD;  Location: [facility] ENDOSCOPY;  Service: Gastroenterology;  Laterality: N/A;  . ESOPHAGOGASTRODUODENOSCOPY (EGD) WITH PROPOFOL N/A 03/25/2018   Procedure:  ESOPHAGOGASTRODUODENOSCOPY (EGD) WITH PROPOFOL;  Surgeon: Lin Landsman, MD;  Location: [facility] ENDOSCOPY;  Service: Gastroenterology;  Laterality: N/A;  . ESOPHAGOGASTRODUODENOSCOPY (EGD) WITH PROPOFOL N/A 04/22/2018   Procedure: ESOPHAGOGASTRODUODENOSCOPY (EGD) WITH PROPOFOL with band ligation;  Surgeon: Lin Landsman, MD;  Location: [facility];  Service: Gastroenterology;  Laterality: N/A;  .  ESOPHAGOGASTRODUODENOSCOPY (EGD) WITH PROPOFOL N/A 06/24/2018   Procedure: ESOPHAGOGASTRODUODENOSCOPY (EGD) WITH PROPOFOL;  Surgeon: Lin Landsman, MD;  Location: [facility] ENDOSCOPY;  Service: Gastroenterology;  Laterality: N/A;  . ESOPHAGOGASTRODUODENOSCOPY (EGD) WITH PROPOFOL N/A 11/19/2018   Procedure: ESOPHAGOGASTRODUODENOSCOPY (EGD) WITH PROPOFOL;  Surgeon: Lin Landsman, MD;  Location: [facility] ENDOSCOPY;  Service: Gastroenterology;  Laterality: N/A;  . EYE SURGERY    . HEMORRHOID BANDING  03/25/2018   Procedure: HEMORRHOID BANDING;  Surgeon: Lin Landsman, MD;  Location: [facility] ENDOSCOPY;  Service: Gastroenterology;;  . IR EMBO ART  VEN HEMORR LYMPH EXTRAV  INC GUIDE ROADMAPPING  08/07/2018  . IR RADIOLOGIST EVAL & MGMT  09/03/2018  . IR RADIOLOGIST EVAL & MGMT  12/17/2018  . IR RADIOLOGIST EVAL & MGMT  06/24/2019  . IR TIPS  08/07/2018  . RADIOLOGY WITH ANESTHESIA N/A 08/07/2018   Procedure: TIPS;  Surgeon: Corrie Mckusick, DO;  Location: [facility];  Service: Anesthesiology;  Laterality: N/A;  . TONSILLECTOMY      Allergies: Patient has no known allergies.  Medications: Prior to Admission medications   Medication Sig Start Date End Date Taking? Authorizing Provider  acetaminophen-codeine (TYLENOL #3) 300-30 MG tablet Take 1 tablet by mouth every 6 (six) hours as needed. 10/21/19   [provider]  ciprofloxacin (CIPRO) 500 MG tablet Take 500 mg by mouth 2 (two) times daily. 12/25/19   [provider]  cyanocobalamin (,VITAMIN B-12,) 1000 MCG/ML injection Inject 1,000 mcg into the muscle every 30 (thirty) days.     [provider]  fluconazole (DIFLUCAN) 150 MG tablet Take 150 mg by mouth daily. 10/29/19   [provider]  furosemide (LASIX) 40 MG tablet Take 1 tablet (40 mg total) by mouth daily. 01/19/20 04/18/20  Lin Landsman, MD  HYDROcodone-acetaminophen (NORCO) 7.5-325 MG tablet Take 1 tablet by mouth every 4 (four) hours as needed. 11/26/19    [provider]  hydrOXYzine (ATARAX/VISTARIL) 10 MG tablet TAKE 1 TABLET (10 MG TOTAL) BY MOUTH 3 (THREE) TIMES DAILY AS NEEDED FOR ITCHING. 11/03/19   Vanga, Tally Due, MD  lactulose (CHRONULAC) 10 GM/15ML solution TAKE 30 MLS (20 G TOTAL) BY MOUTH 2 (TWO) TIMES DAILY. 01/05/20   Lin Landsman, MD  metFORMIN (GLUCOPHAGE) 500 MG tablet Take 1,000 mg by mouth 2 (two) times daily with a meal.     [provider]  Misc. Devices KIT Moderate compression hose 20-30 MM HG 10/14/19   Vanga, Tally Due, MD  mupirocin ointment (BACTROBAN) 2 % mupirocin 2 % topical ointment    [provider]  nystatin (MYCOSTATIN/NYSTOP) powder Apply 1 g topically as needed (rash).  05/23/18   [provider]  ondansetron (ZOFRAN) 4 MG tablet ondansetron HCl 4 mg tablet  TAKE 1 TABLET BY MOUTH EVERY 6 HOURS AS NEEDED    [provider]  OneTouch Delica Lancets 95M MISC 2 (two) times daily. 12/01/19   [provider]  [facility] ULTRA test strip 2 (two) times daily. 11/23/19   [provider]  pantoprazole (PROTONIX) 40 MG tablet Take 40 mg by mouth 2 (two) times daily. 06/29/19   [provider]  potassium chloride (  KLOR-CON 10) 10 MEQ tablet Take 1 tablet (10 mEq total) by mouth daily for 4 days. 08/22/19 12/31/19  Saundra Shelling, MD  rifaximin (XIFAXAN) 550 MG TABS tablet TAKE 1 TABLET (550 MG TOTAL) BY MOUTH 2 (TWO) TIMES DAILY. 10/20/19   Lin Landsman, MD  spironolactone (ALDACTONE) 50 MG tablet Take 1 tablet (50 mg total) by mouth daily. 11/26/19   Lin Landsman, MD  VENTOLIN HFA 108 [id number] Base) MCG/ACT inhaler SMARTSIG:2 Puff(s) By Mouth Every 4 Hours PRN 10/28/19   [provider]  Vitamin D, Ergocalciferol, (DRISDOL) 1.25 MG (50000 UT) CAPS capsule TAKE 1 CAPSULE BY MOUTH ONE TIME PER WEEK Patient taking differently: Take 50,000 Units by mouth once a week.  08/11/19   Lin Landsman, MD     Family History  Problem  Relation Age of Onset  . CAD Father   . Heart attack Father 5  . Cancer Maternal Uncle   . Seizures Mother     Social History   Socioeconomic History  . Marital status: Single    Spouse name: Not on file  . Number of children: Not on file  . Years of education: Not on file  . Highest education level: Not on file  Occupational History  . Not on file  Tobacco Use  . Smoking status: Never Smoker  . Smokeless tobacco: Never Used  Substance and Sexual Activity  . Alcohol use: Not Currently    Comment: No EtOH for 30 years.  Never a heavy drinker.  . Drug use: Never  . Sexual activity: Not on file  Other Topics Concern  . Not on file  Social History Narrative   Independent at baseline. Lives at home with family   Social Determinants of Health   Financial Resource Strain:   . Difficulty of Paying Living Expenses:   Food Insecurity:   . Worried About Charity fundraiser in the Last Year:   . Arboriculturist in the Last Year:   Transportation Needs:   . Film/video editor (Medical):   Marland Kitchen Lack of Transportation (Non-Medical):   Physical Activity:   . Days of Exercise per Week:   . Minutes of Exercise per Session:   Stress:   . Feeling of Stress :   Social Connections:   . Frequency of Communication with Friends and Family:   . Frequency of Social Gatherings with Friends and Family:   . Attends Religious Services:   . Active Member of Clubs or Organizations:   . Attends Archivist Meetings:   Marland Kitchen Marital Status:        Review of Systems  Review of Systems: A 12 point ROS discussed and pertinent positives are indicated in the HPI above.  All other systems are negative.  Physical Exam No direct physical exam was performed (except for noted visual exam findings with Video Visits).    Vital Signs: LMP  (LMP Unknown)   Imaging: US LIVER DOPPLER  Result Date: 01/20/2020 CLINICAL DATA:  69 year old female with a history of tips EXAM: DUPLEX ULTRASOUND OF  LIVER TECHNIQUE: Color and duplex Doppler ultrasound was performed to evaluate the hepatic in-flow and out-flow vessels. COMPARISON:  MR 11/12/2019 FINDINGS: Portal Vein Velocities Main:  45 cm/sec Right: Not imaged Left:  42 cm/sec Hepatic Vein Velocities Right:  169 cm/sec Middle:  19 cm/sec Left:  30 cm/sec Hepatic Artery Velocity:  74 cm/sec Splenic Vein Velocity:  26 cm/sec Varices: Small varicosities at the splenic hilum.  Ascites: Trace ascites Tips Proximal: 45 centimeter/second Mid stent: 115 centimeter/second Distal stent: 118 centimeter/second Spleen volume estimated 679 cubic cm Hyperechoic focus in the right liver with shadowing compatible with coarse calcification present on prior imaging. The questionable lesion on recent MRI is not visualized on the current duplex. IMPRESSION: Directed duplex demonstrates patent hepatic vasculature and TIPS. Signed, Dulcy Fanny. Dellia Nims, RPVI [facility] Radiology Electronically Signed   By: Corrie Mckusick D.O.   On: 01/20/2020 15:58    Labs:  CBC: Recent Labs    02/12/19 1313 04/24/19 1117 08/20/19 1533 08/21/19 0439  WBC 4.5 3.9* 4.3 3.6*  HGB 12.4 12.1 12.9 12.4  HCT 36.8 35.8* 40.6 36.6  PLT 128* 129* 118* 111*    COAGS: Recent Labs    04/24/19 1117 06/16/19 1012  INR 1.2 1.1  APTT 37*  --     BMP: Recent Labs    04/24/19 1117 04/24/19 1117 06/16/19 1012 08/20/19 1533 08/21/19 0439 08/22/19 0429 10/24/19 0846  NA 131*  --  134* 136 138  --   --   K 3.7   < > 4.2 3.8 2.9* 3.7  --   CL 104  --  101 102 104  --   --   CO2 17*  --  19* 16* 24  --   --   GLUCOSE 85  --  122* 166* 75  --   --   BUN 11  --  _0 --   --   CALCIUM 9.1  --  10.0 9.9 9.2  --   --   CREATININE 0.91  --  1.18* 1.35* 0.98  --  1.10*  GFRNONAA >60  --  48* 41* 60*  --   --   GFRAA >60  --  55* 47* >60  --   --    < > = values in this interval not displayed.    LIVER FUNCTION TESTS: Recent Labs     04/24/19 1117 06/16/19 1012 08/20/19 1533  BILITOT 1.5* 1.5* 2.3*  AST 61* 60* 60*  ALT _1 ALKPHOS 168*  --  134*  PROT 7.3 6.6 7.0  ALBUMIN 3.2*  --  3.2*    TUMOR MARKERS: No results for input(s): AFPTM, CEA, CA199, CHROMGRNA in the last 8760 hours.  Assessment and Plan:  Ms Fleishman is a very pleasant 69 yo female SP TIPS for emergent bleeding, performed September of 2019.   She is doing just fine right now, with no recent hospitalizations and performing all of her ADL's.    No recent episodes of HE, and she continues to take lactulose 2x daily.   I assured her that we would set her up for a follow up visit with a repeat TIPS duplex in about 12 months.  In the meantime, if she has any problems, we are happy to see her back.   Plan: - repeat office visit in 12 months with TIPS duplex.    Electronically Signed: Corrie Mckusick 01/22/2020, 9:56 AM   I spent a total of    25 Minutes in remote  clinical consultation, greater than 50% of which was counseling/coordinating care for portal HTN, SP TIPS.    Visit type: Audio only (telephone). Audio (no video) only due to patient's lack of internet/smartphone capability. Alternative for in-person consultation at [facility], [address], [place], [place]. This visit type was conducted due to national recommendations for restrictions regarding the  COVID-19 Pandemic (e.g. social distancing).  This format is felt to be most appropriate for this patient at this time.  All issues noted in this document were discussed and addressed.

## 2020-01-26 ENCOUNTER — Telehealth: Payer: Self-pay

## 2020-01-26 DIAGNOSIS — K7460 Unspecified cirrhosis of liver: Secondary | ICD-10-CM

## 2020-01-26 DIAGNOSIS — K7290 Hepatic failure, unspecified without coma: Secondary | ICD-10-CM

## 2020-01-26 DIAGNOSIS — K7682 Hepatic encephalopathy: Secondary | ICD-10-CM

## 2020-01-26 NOTE — Telephone Encounter (Signed)
Patient is calling because she had nausea last night. This morning when she woke up she was dizzy and had confusion. Patient denies any fever, chills, abdominal pain, rectal bleeding or black stools. Patient states she has been taking the lactulose and rifaximin.   Patient states that she just feels off. Talked to vanga she states for patient to take a extra dose of lactulose and for patient to check her blood pressure. She also wanted patient to get a CBC and CMP tomorrow. Patient states she will get [facility] [place]. Patient states she will take a extra does of the lactulose and take her blood pressure

## 2020-01-27 DIAGNOSIS — K7460 Unspecified cirrhosis of liver: Secondary | ICD-10-CM | POA: Diagnosis not present

## 2020-01-27 DIAGNOSIS — K7290 Hepatic failure, unspecified without coma: Secondary | ICD-10-CM | POA: Diagnosis not present

## 2020-01-28 ENCOUNTER — Telehealth: Payer: Self-pay

## 2020-01-28 DIAGNOSIS — D649 Anemia, unspecified: Secondary | ICD-10-CM

## 2020-01-28 DIAGNOSIS — K7460 Unspecified cirrhosis of liver: Secondary | ICD-10-CM

## 2020-01-28 DIAGNOSIS — K7290 Hepatic failure, unspecified without coma: Secondary | ICD-10-CM

## 2020-01-28 LAB — COMPREHENSIVE METABOLIC PANEL
ALT: 20 IU/L (ref 0–32)
AST: 42 IU/L — ABNORMAL HIGH (ref 0–40)
Albumin/Globulin Ratio: 0.9 — ABNORMAL LOW (ref 1.2–2.2)
Albumin: 3.1 g/dL — ABNORMAL LOW (ref 3.8–4.8)
Alkaline Phosphatase: 155 IU/L — ABNORMAL HIGH (ref 39–117)
BUN/Creatinine Ratio: 11 — ABNORMAL LOW (ref 12–28)
BUN: 17 mg/dL (ref 8–27)
Bilirubin Total: 1.4 mg/dL — ABNORMAL HIGH (ref 0.0–1.2)
CO2: 15 mmol/L — ABNORMAL LOW (ref 20–29)
Calcium: 9.7 mg/dL (ref 8.7–10.3)
Chloride: 107 mmol/L — ABNORMAL HIGH (ref 96–106)
Creatinine, Ser: 1.53 mg/dL — ABNORMAL HIGH (ref 0.57–1.00)
GFR calc Af Amer: 40 mL/min/{1.73_m2} — ABNORMAL LOW (ref >59)
GFR calc non Af Amer: 35 mL/min/{1.73_m2} — ABNORMAL LOW (ref >59)
Globulin, Total: 3.3 g/dL (ref 1.5–4.5)
Glucose: 122 mg/dL — ABNORMAL HIGH (ref 65–99)
Potassium: 3.9 mmol/L (ref 3.5–5.2)
Sodium: 141 mmol/L (ref 134–144)
Total Protein: 6.4 g/dL (ref 6.0–8.5)

## 2020-01-28 LAB — CBC WITH DIFFERENTIAL/PLATELET
Basophils Absolute: 0 10*3/uL (ref 0.0–0.2)
Basos: 1 %
EOS (ABSOLUTE): 0.1 10*3/uL (ref 0.0–0.4)
Eos: 2 %
Hematocrit: 31.4 % — ABNORMAL LOW (ref 34.0–46.6)
Hemoglobin: 10.7 g/dL — ABNORMAL LOW (ref 11.1–15.9)
Immature Grans (Abs): 0 10*3/uL (ref 0.0–0.1)
Immature Granulocytes: 0 %
Lymphocytes Absolute: 1 10*3/uL (ref 0.7–3.1)
Lymphs: 27 %
MCH: 28 pg (ref 26.6–33.0)
MCHC: 34.1 g/dL (ref 31.5–35.7)
MCV: 82 fL (ref 79–97)
Monocytes Absolute: 0.4 10*3/uL (ref 0.1–0.9)
Monocytes: 11 %
Neutrophils Absolute: 2.2 10*3/uL (ref 1.4–7.0)
Neutrophils: 59 %
Platelets: 149 10*3/uL — ABNORMAL LOW (ref 150–450)
RBC: 3.82 x10E6/uL (ref 3.77–5.28)
RDW: 15.7 % — ABNORMAL HIGH (ref 11.7–15.4)
WBC: 3.7 10*3/uL (ref 3.4–10.8)

## 2020-01-28 NOTE — Telephone Encounter (Signed)
-----  Message from Lin Landsman, MD sent at 01/28/2020 10:05 AM EDT ----- Her hemoglobin is low, recommend to check iron studies and B12 levels, please check with the lab if we can add on these from yesterday's sample Also, recommend upper endoscopy as I am concerned she might be bleeding in her stomach  Her creatinine is slightly elevated, decrease Lasix to 20 mg a day  Rohini Vanga

## 2020-01-28 NOTE — Telephone Encounter (Signed)
Thanks for checking on her.  I am glad she is feeling better today  RV

## 2020-01-28 NOTE — Telephone Encounter (Signed)
Called patient and patient states she is feeling better. She denies any dizziness or confusion. Patient states she is still taking a extra does of lactose each day. Patient feels better doing this.

## 2020-01-28 NOTE — Telephone Encounter (Signed)
Patient states she will go and get blood work done today. Patient will decreased the Lasix to 5m a day. Patient states she will call back and scheduled the EGD when she is at home and has her scheduled in front of her.

## 2020-01-29 NOTE — Telephone Encounter (Signed)
Patient states she will give [place] a call when she is ready to scheduled the procedure. Patient states she does not know her scheduled at this time

## 2020-01-30 DIAGNOSIS — K7290 Hepatic failure, unspecified without coma: Secondary | ICD-10-CM | POA: Diagnosis not present

## 2020-01-30 DIAGNOSIS — K7460 Unspecified cirrhosis of liver: Secondary | ICD-10-CM | POA: Diagnosis not present

## 2020-01-30 DIAGNOSIS — D649 Anemia, unspecified: Secondary | ICD-10-CM | POA: Diagnosis not present

## 2020-01-31 LAB — IRON,TIBC AND FERRITIN PANEL
Ferritin: 104 ng/mL (ref 15–150)
Iron Saturation: 30 % (ref 15–55)
Iron: 69 ug/dL (ref 27–139)
Total Iron Binding Capacity: 232 ug/dL — ABNORMAL LOW (ref 250–450)
UIBC: 163 ug/dL (ref 118–369)

## 2020-01-31 LAB — VITAMIN B12: Vitamin B-12: 1162 pg/mL (ref 232–1245)

## 2020-02-01 NOTE — Progress Notes (Signed)
[facility]  [address] [place], [place] [address] Phone: [phone]  Fax: [phone]   Clinic Day:  02/03/2020  Referring physician: Lorelee Market, MD  Chief Complaint: Crystal [name] is a 69 y.o. female with tworight sided pulmonary nodules and lymphadenopathy who is seen for assessment after interval chest CT.   HPI: The patient was last seen in the medical oncology clinic on 12/22/2019. At that time, she felt "alright".  She denied any melena or hematochezia.  She noted some dysuria.  [facility] labs revealed a hematocrit 29.7, hemoglobin 10.1, platelets 129,000, WBC 3,800. Ferritin was 21. Sed rate was 50. We discussed a follow up chest CT for her lung nodules.   UA and culture revealed > 100,000 colonies Serratia marcescens sensitive to ceftriaxone, ciprofloxacin, gentamicin and Septra.  Dr. Gala Murdoch office contacted.  Ciprofloxacin 250 mg BID x 3 days was planned.   She received Venofer weekly x 3 (12/22/2019 - 01/05/2020). Patient received a B12 injection on 01/05/2020.   She was seen by Dr. Saunders Revel on 12/31/2019.  She reported stable dyspnea on exertion. She noted mild leg swelling and minimal edema, which she controlled with taking an extra dose of furosemide as needed. Fatigue improved with iron supplementation. She denied orthopnea, chest pain, palpitations, and lightheadedness. She had ganied about 6 pounds in the last few weeks. Her furosemide was increased to 60 mg daily until her weight returned to baseline at which time she could return back to 40 mg daily.   Patient was seen on 01/22/2020 by Dr. Corrie Mckusick of interventional radiology via telemedicine for follow-up s/p TIPS.  She reported feeling tired after doing a lot of activities. She denied any hematemesis or melena. She continued to complete all of her ADL's comfortably and completely. Exam was stable. She will follow up in 12 months.   Chest CT on 02/02/2020 revealed stable  nodules within the right middle and right lower lobes.  Continued follow-up can be performed as clinically indicated. There were cirrhotic changes of the liver with TIPS shunt in place. There was mild prominence of the ascending aorta to 4 cm. Recommended annual imaging followup by CTA or MRA.  [facility] labs on 01/15/2020: Hematocrit 29.4, hemoglobin 9.8, platelets 114,000, WBC 3,100. Ferritin was 191 with iron saturation of 42% and a TIBC of 233.   During the interim, she has been feeling "pretty good".  She is taking lasix 20 mg BID. Patient is considering having a EGD with Dr. Marius Ditch.  I agreed that she should have an EGD. She notes having an episode around 01/27/2020 and she did not go to the hospital. She felt dizzy, nauseous and confused. She stayed at home and took extra lactulose.    Past Medical History:  Diagnosis Date  . Acute GI bleeding 02/09/2018  . Acute upper gastrointestinal bleeding 08/03/2018  . Anemia    TAKES IRON TAB  . Arthritis    SHOULDER  . B12 deficiency 02/18/2018  . Bleeding    GI  3/19  . Bronchitis    HX OF  . Cirrhosis ([place])   . Colon cancer screening   . Diabetes mellitus without complication ([place])    TYPE 2  . Fatty (change of) liver, not elsewhere classified   . Full dentures    UPPER AND LOWER  . Iron deficiency anemia due to chronic blood loss 02/18/2018  . Lung nodule, multiple   . Pernicious anemia 02/22/2018  . PUD (peptic ulcer disease)   .  Raynaud's syndrome   . Upper GI bleeding 08/03/2018    Past Surgical History:  Procedure Laterality Date  . CATARACT EXTRACTION W/PHACO Right 02/06/2018   Procedure: CATARACT EXTRACTION PHACO AND INTRAOCULAR LENS PLACEMENT ([place]) RIGHT DIABETIC;  Surgeon: Leandrew Koyanagi, MD;  Location: [place];  Service: Ophthalmology;  Laterality: Right;  DIABETIC, ORAL MED  . CATARACT EXTRACTION W/PHACO Left 04/30/2018   Procedure: CATARACT EXTRACTION PHACO AND INTRAOCULAR LENS PLACEMENT (I[facility]);  Surgeon:  Leandrew Koyanagi, MD;  Location: [facility] ORS;  Service: Ophthalmology;  Laterality: Left;  [place] 01:04 AP% 21.3 CDE 13.66 Fluid pack lot # [id number]  . COLONOSCOPY WITH PROPOFOL N/A 03/25/2018   Procedure: COLONOSCOPY WITH PROPOFOL;  Surgeon: Lin Landsman, MD;  Location: [facility] ENDOSCOPY;  Service: Gastroenterology;  Laterality: N/A;  . DENTAL SURGERY     EXTRACTIONS  . ELECTROMAGNETIC NAVIGATION BROCHOSCOPY Right 01/27/2019   Procedure: ELECTROMAGNETIC NAVIGATION BRONCHOSCOPY;  Surgeon: [name], MD;  Location: [facility] OR[facility];  Service: Cardiopulmonary;  Laterality: Right;  . ESOPHAGOGASTRODUODENOSCOPY N/A 08/03/2018   Procedure: ESOPHAGOGASTRODUODENOSCOPY (EGD);  Surgeon: Lin Landsman, MD;  Location[facility] ENDOSCOPY;  Service: Gastroenterology;  Laterality: N/A;  . ESOPHAGOGASTRODUODENOSCOPY (EGD) WITH PROPOFOL N/A 02/10/2018   Procedure: ESOPHAGOGASTRODUODENOSCOPY (EGD) WITH PROPOFOL;  Surgeon: Jonathon Bellows, MD;  Location: [facility] ENDOSCOPY;  Service: Gastroenterology;  Laterality: N/A;  . ESOPHAGOGASTRODUODENOSCOPY (EGD) WITH PROPOFOL N/A 03/25/2018   Procedure: ESOPHAGOGASTRODUODENOSCOPY (EGD) WITH PROPOFOL;  Surgeon: Lin Landsman, MD;  Location: [facility] ENDOSCOPY;  Service: Gastroenterology;  Laterality: N/A;  . ESOPHAGOGASTRODUODENOSCOPY (EGD) WITH PROPOFOL N/A 04/22/2018   Procedure: ESOPHAGOGASTRODUODENOSCOPY (EGD) WITH PROPOFOL with band ligation;  Surgeon: Lin Landsman, MD;  Location: [place];  Service: Gastroenterology;  Laterality: N/A;  . ESOPHAGOGASTRODUODENOSCOPY (EGD) WITH PROPOFOL N/A 06/24/2018   Procedure: ESOPHAGOGASTRODUODENOSCOPY (EGD) WITH PROPOFOL;  Surgeon: Lin Landsman, MD;  Location: [facility] ENDOSCOPY;  Service: Gastroenterology;  Laterality: N/A;  . ESOPHAGOGASTRODUODENOSCOPY (EGD) WITH PROPOFOL N/A 11/19/2018   Procedure: ESOPHAGOGASTRODUODENOSCOPY (EGD) WITH PROPOFOL;  Surgeon: Lin Landsman, MD;  Location: [facility] ENDOSCOPY;  Service:  Gastroenterology;  Laterality: N/A;  . EYE SURGERY    . HEMORRHOID BANDING  03/25/2018   Procedure: HEMORRHOID BANDING;  Surgeon: Lin Landsman, MD;  Location: [facility] ENDOSCOPY;  Service: Gastroenterology;;  . IR EMBO ART  VEN HEMORR LYMPH EXTRAV  INC GUIDE ROADMAPPING  08/07/2018  . IR RADIOLOGIST EVAL & MGMT  09/03/2018  . IR RADIOLOGIST EVAL & MGMT  12/17/2018  . IR RADIOLOGIST EVAL & MGMT  06/24/2019  . IR RADIOLOGIST EVAL & MGMT  01/22/2020  . IR TIPS  08/07/2018  . RADIOLOGY WITH ANESTHESIA N/A 08/07/2018   Procedure: TIPS;  Surgeon: Corrie Mckusick, DO;  Location: [place];  Service: Anesthesiology;  Laterality: N/A;  . TONSILLECTOMY      Family History  Problem Relation Age of Onset  . CAD Father   . Heart attack Father 59  . Cancer Maternal Uncle   . Seizures Mother     Social History:  reports that she has never smoked. She has never used smokeless tobacco. She reports previous alcohol use. She reports that she does not use drugs. She retired from [employer] in 11/2017. She lives in [place]. The patient is alone today.  Allergies: No Known Allergies  Current Medications: Current Outpatient Medications  Medication Sig Dispense Refill  . cyanocobalamin (,VITAMIN B-12,) 1000 MCG/ML injection Inject 1,000 mcg into the muscle every 30 (thirty) days.     . furosemide (LASIX) 40 MG tablet Take  1 tablet (40 mg total) by mouth daily. 90 tablet 2  . hydrOXYzine (ATARAX/VISTARIL) 10 MG tablet TAKE 1 TABLET (10 MG TOTAL) BY MOUTH 3 (THREE) TIMES DAILY AS NEEDED FOR ITCHING. 30 tablet 0  . lactulose (CHRONULAC) 10 GM/15ML solution TAKE 30 MLS (20 G TOTAL) BY MOUTH 2 (TWO) TIMES DAILY. 1892 mL 2  . metFORMIN (GLUCOPHAGE) 500 MG tablet Take 1,000 mg by mouth 2 (two) times daily with a meal.     . Misc. Devices KIT Moderate compression hose 20-30 MM HG 1 kit 0  . ondansetron (ZOFRAN) 4 MG tablet ondansetron HCl 4 mg tablet  TAKE 1 TABLET BY MOUTH EVERY 6 HOURS AS NEEDED    . OneTouch Delica  Lancets 82X MISC 2 (two) times daily.    Glory Rosebush ULTRA test strip 2 (two) times daily.    . pantoprazole (PROTONIX) 40 MG tablet Take 40 mg by mouth 2 (two) times daily.    . rifaximin (XIFAXAN) 550 MG TABS tablet TAKE 1 TABLET (550 MG TOTAL) BY MOUTH 2 (TWO) TIMES DAILY. 180 tablet 1  . spironolactone (ALDACTONE) 50 MG tablet Take 1 tablet (50 mg total) by mouth daily. 60 tablet 1  . fluconazole (DIFLUCAN) 150 MG tablet Take 150 mg by mouth daily.    . mupirocin ointment (BACTROBAN) 2 % mupirocin 2 % topical ointment    . nystatin (MYCOSTATIN/NYSTOP) powder Apply 1 g topically as needed (rash).     . potassium chloride (KLOR-CON 10) 10 MEQ tablet Take 1 tablet (10 mEq total) by mouth daily for 4 days. 4 tablet 0  . VENTOLIN HFA 108 (90 Base) MCG/ACT inhaler SMARTSIG:2 Puff(s) By Mouth Every 4 Hours PRN    . Vitamin D, Ergocalciferol, (DRISDOL) 1.25 MG (50000 UT) CAPS capsule TAKE 1 CAPSULE BY MOUTH ONE TIME PER WEEK (Patient not taking: No sig reported) 12 capsule 0   No current facility-administered medications for this visit.    Review of Systems  Constitutional: Positive for weight loss (9 lbs). Negative for chills, diaphoresis, fever and malaise/fatigue.       Doing good.  HENT: Negative.  Negative for congestion, ear pain, nosebleeds, sinus pain and sore throat.        Dry mouth.  Eyes: Negative.  Negative for blurred vision, double vision, photophobia and pain.  Respiratory: Negative.  Negative for cough, hemoptysis, sputum production and shortness of breath.   Cardiovascular: Negative.  Negative for chest pain, palpitations, orthopnea, leg swelling and PND.  Gastrointestinal: Negative for abdominal pain, blood in stool, constipation, diarrhea (lactulose causes loose stools), heartburn, melena, nausea and vomiting.       Portal hypertensive gastropathy s/p variceal ligation.  Ice pica.  Eating well.   Genitourinary: Positive for dysuria (burning sensation). Negative for frequency,  hematuria and urgency.  Musculoskeletal: Negative for back pain, falls, joint pain, myalgias and neck pain.  Skin: Positive for itching (back at night ; lower abdominal itch gone). Negative for rash.       Telangectasias on face and back.  Neurological: Negative.  Negative for dizziness, tremors, sensory change, speech change, focal weakness, seizures, weakness and headaches.  Endo/Heme/Allergies: Does not bruise/bleed easily.       Diabetes on Metformin.  Psychiatric/Behavioral: Negative.  Negative for depression and memory loss. The patient is not nervous/anxious and does not have insomnia.   All other systems reviewed and are negative.  Performance status (ECOG):  2  Vitals Blood pressure (!) 123/52, pulse 67, temperature (!) 96.1 F (  35.6 C), temperature source Tympanic, resp. rate 18, weight 169 lb 12.1 oz (77 kg).   Physical Exam  Constitutional: She is oriented to person, place, and time. She appears well-developed and well-nourished. No distress.  HENT:  Head: Normocephalic and atraumatic.  Mouth/Throat: No oropharyngeal exudate.  Shoulder length gray hair.  Dry mouth.  Mask.  Eyes: Pupils are equal, round, and reactive to light. Conjunctivae and EOM are normal. No scleral icterus.  Glasses.  Blue eyes.  Neck: No JVD present.  Cardiovascular: Normal rate, regular rhythm and normal heart sounds.  No murmur heard. Pulmonary/Chest: Effort normal and breath sounds normal. No respiratory distress. She has no wheezes. She has no rales.  Abdominal: Soft. Bowel sounds are normal. She exhibits no distension. There is no abdominal tenderness. There is no rebound and no guarding.  Spleen tip palpable.  Musculoskeletal:        General: No tenderness or edema. Normal range of motion.     Cervical back: Normal range of motion and neck supple.  Lymphadenopathy:    She has no cervical adenopathy.    She has no axillary adenopathy.       Right: No supraclavicular adenopathy present.        Left: No supraclavicular adenopathy present.  Neurological: She is alert and oriented to person, place, and time.  Skin: Skin is warm and dry. No rash noted. She is not diaphoretic. No erythema. No pallor.  Telangectasias face and chest. Spider angiomas.  Psychiatric: She has a normal mood and affect. Her behavior is normal. Judgment and thought content normal.  Nursing note and vitals reviewed.   No visits with results within 3 Day(s) from this visit.  Latest known visit with results is:  Telephone on 01/28/2020  Component Date Value Ref Range Status  . Total Iron Binding Capacity 01/30/2020 232* 250 - 450 ug/dL Final  . UIBC 01/30/2020 163  118 - 369 ug/dL Final  . Iron 01/30/2020 69  27 - 139 ug/dL Final  . Iron Saturation 01/30/2020 30  15 - 55 % Final  . Ferritin 01/30/2020 104  15 - 150 ng/mL Final  . Vitamin B-12 01/30/2020 1,162  232 - 1,245 pg/mL Final    Assessment:  Crystal [name] is a 68 y.o. female with a right lower lobe pulmonary nodule. She denies any smoking history.  Abdomen and pelvic CTon 02/09/2018 revealed an 1.8 cm spiculated density in right lower lobe concerning for malignancy. There was mildly nodular hepatic contourconcerning for hepatic cirrhosis. There was moderate splenomegalysuggesting portal venous hypertension. Mild ascites was noted. There were mildly enlarged retroperitoneal lymph nodes (largest 1.1 cm) concerning for possible metastatic disease or malignancy. There was a 7 cm left ovarian cyst. Further evaluation with MRI was recommended to evaluate for possible neoplasm. CA125was 351.3 and CEA1.1 on 02/21/2018.   Chest CTon 02/25/2018 revealed a 2.3 x 1.5 x 1.0 cm perifissural nodulewith spiculated margins in the right middle lobe. There was a 1.5 x 1.5 x 1.4 cm right lower lobe spiculated nodule. There was a moderate left sided pleural effusion.   PET scanon 03/08/2018 revealed a 2.5 x 1.8 cm sub solid nodular opacity in theRML(SUV  2.7) and a 1.6 cm roundedslightly spiculated RLL pulmonary nodule(SUV 2.1). There were no enlarged or hypermetabolic mediastinal or hilar lymph nodes. There was a small left pleural effusion. 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  Chest CTon 05/15/2018 revealed no significant changes in the  2 previously noted nodules in the RIGHT lung. Irregular RML lesion measures 2.4 x 1.4 cm (previously 2.3 x 1.5 cm). Spiculated central RLL lesion measures 1.8 x 1.4 cm (previously 1.6 x 1.5 cm). Both areas remain concerning for neoplasm. There was interval decrease in ascites volume overall. Upper abdominal lymphadenopathy noted that was felt to be reactive due to underlying liver disease.   Chest CTon 08/15/2018 revealed the spiculated right lower lobe lung lesion appeared slightly increased and was not significantly changed in size compared with previous exam. Morphologically, this was worrisome for a small bronchogenic neoplasm. The right middle lobe lung nodule was slightly decreased in size in the interval and may be post infectious or inflammatory in etiology.  Super D chest CTon 01/24/2019 revealed a 3.2 x 2.3 cm ill-defined anterior right lung nodule compared to 2.9 x 2.3 cm previously. Minor fissurewasnot well, and as such, this nodule can not be confidently localized to the right upper or middle lobe. The right lower lobe nodule measured2.0 x 2.0 cm comparedto 2.0 x 1.9 cm.  Chest CTon 08/07/2019 revealed no significant interval change in size right middle lobe and right lower lobe lung nodules.Liverwascompatible with cirrhosiss/pTIPS.  Chest CT on 02/02/2020 revealed stable nodules within the right middle and right lower lobes.  There were cirrhotic changes of the liver with TIPS shunt in place. There was mild prominence of the ascending aorta to 4 cm. Recommended annual imaging followup by CTA  or MRA.  She was admitted to [facility] from 02/09/2018 - 04/02/2019with a variceal hemorrhage. EGDon 02/10/2018 revealed 5 columns of grade III esophageal varices in the lower third of the esophagus. There was stigmata of recent bleeding and red wale signs. She underwent variceal ligation x 10. She required 3 units of PRBCs.  EGDon 03/25/2018 revealed 2 angiectasias(non-bleeding) in the second portion of the duodenum, and a few diminutive (non-bleeding) angiectasias in the prepyloric region of the stomach that were treated APC. There were 4 non-bleeding ulcers (Forrest Class III) found in the gastric fundus. There were 4 columns of large non-bleeding varicesin the lower third of the esophagus, of which demonstrated no stigmata of bleeding. Varices were banded. EGD on 04/22/2018 revealed one non-bleeding cratered gastric ulcer(Forrest Class III) in the lesser curvature of the gastric body. Duodenal bulb and second portion of the duodenum were normal. Moderate portal hypertensive gastropathy noted in the stomach. Large varices in the lower third of the esophagus noted. 2 bands were placed with incomplete eradication of the lesions.   Colonoscopyon 03/25/2018 revealed a single 5 mm sessile polyp in the ascending colon. Patient has rectal varices and external hemorrhoids. Pathology returned as tubular adenoma, and was negative for high grade dysplasia and malignancy.   She has iron deficiency anemia. Ferritinwas 6. She is on ferrous sulfate 325 mg TID. She hasB12 deficiency. B12was 103. Intrinsic factor antibody was negative. 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 She began B12 injections on 03/06/2018 (last 11/24/2019). Folate was12.3on 10/27/2019. B12 was 485 on 05/24/2018. Dietis good.  She received Venoferweekly x 4 (07/12/2018 - 07/31/2018) and x 3 (12/22/2019 - 01/05/2020).   Ferritinhas been followed: 6 on 02/09/2018, 16 on  05/24/2018, 13 on 07/10/2018, 374 on 08/15/2018, 485 on 08/21/2018, 114 on 09/25/2018, 63 on 11/20/2018, 35 on 02/12/2019, 22 on 04/24/2019, 41 on 09/22/2019, 41 on 10/27/2019, and 21 on 12/15/2019.  She has hepatitis C antibodies. HCV RNA was not detected. She has been exposed to hepatitis B. Anti-smooth muscle antibodiesare weakly positive.  ANAwas positive (>1:1280 centromere antibody). Negative studiesincluded: ceruloplasmin, anti-mitochondrial antibodies, hepatitis B IgM, HIV, and alpha-1 antitrypsin. PT was 15.2 (INR 1.21). AFPwas 1.5 on 05/28/2018,2.4 on 11/20/2018, and 2.0 on 04/24/2019.  She has a history of decompensated cirrhosiss/p TIPSprocedure 08/07/2018. She has portal hypertension and a history of recurrent bleeding esophageal varices.She was given a 2-year life expectancyfollowing her TIPS procedure. Abdominal/pelvic arterial/venous ultrasound doppleron 04/24/2019 revealed a patent TIPS and no evidence of persisting ascites.  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 There was no biliary duct dilatation. There was cholelithiasis without acute cholecystitis. Right hepatic lobe 9 mm lesion which was indeterminate, LR 3. This was possibly present on the prior CT of 2019.  Recommendation included repeat pre and post contrast abdominal MRI at 3-6 months. There as small volume abdominal ascites. The right lower lobe pulmonary nodule was suboptimally evaluated.  [facility] with[facility].HeadCT revealedno acute intracranial pathology. Head MRIwo contrast showed symmetric FLAIR hyperintensity within the bilateral mamillary bodies. This finding may be seen in the setting of Wernicke encephalopathy.There was no evidence of acute infarct and mild chronic small vessels with ischemic disease.Her mental status returned to baseline.  RUQ ultrasoundon 05/29/2018 revealed  cholelithiasis. There was a thickened edematous gallbladder wall. The wall thickening could be due to hypoproteinemia/hypoalbuminemia or cholecystitis. Negative sonographic Murphy's sign. There was a heterogeneous slightly nodular liver consistent with cirrhosis.  Pelvic ultrasoundon 03/18/2018 demonstrated a simple cyst in the LEFT ovarymeasuring 6.1 x 4.5 x 6.6 cm. Repeat imaging recommended in 1 year to document stability.  Symptomatically, she feels "pretty good".  She had an episode of dizziness, nausea and confusion on 01/27/2020.  She took extra lactulose.  Exam is stable.  Plan: 1.   Review labs from 01/27/2020 and 01/30/2020.   2.Decompensated cirrhosis s/p TIPS Clinically,she continues to do fairly well. Life expectancy is limited s/pTIPS (2 years). Shedenies any excess bruising or bleeding.   AFPwas 2.0on06/09/2019. She is followed by GI. 3.Pulmonary nodules Chest CT on 09/24/2020revealed no significant interval change in sizeRMLand RLLlung nodules.   Chest CT on 02/02/2020 was personally reviewed.  Agree with radiology interpretation.   There are stable nodules within the right middle and right lower lobes.  Discuss patient's thoughts about follow-up given life expectancy with TIPS. 4.Iron deficiency anemia Hematocrit31.4. Hemoglobin10.7. MCV82on03/16/2021. Ferritin104 on 01/30/2020. IV iron if ferritin < 30. No Venofer today. Discuss EGD with Dr Marius Ditch. Continue to monitor. 5.B12 deficiency Patient receives B12 monthly (last 01/05/2020) B12 today and monthly. 6.Left ovarian cyst Pelvic ultrasound on 03/18/2018 revealed a simple cyst in the left ovary (6.1 x 4.5 x 6.6 cm). Patient declines follow-up secondary to life expectancy s/p TIPS. 7.   [facility] labs in 6 weeks (CBC with diff, ferritin, iron studies). 8.   RTC in 3 months for MD assessment, [facility] labs prior to visit  (CBC with diff, ferritin, iron studies, sed rate), and +/- Venofer.  I discussed the assessment and treatment plan with the patient.  The patient was provided an opportunity to ask questions and all were answered.  The patient agreed with the plan and demonstrated an understanding of the instructions.  The patient was advised to call back if the symptoms worsen or if the condition fails to improve as anticipated.  I provided 15 minutes of face-to-face time during this encounter and > 50% was spent counseling as documented under my assessment and plan. An additional 8-10 minutes were spent reviewing her chart (Epic  and Care Everywhere) including notes, labs, and imaging studies.    Lequita Asal, MD, PhD    02/03/2020, 10:58 AM  I, Selena Batten, am acting as scribe for [employer]. Mike Gip, MD, PhD.  I,  C. Mike Gip, MD, have reviewed the above documentation for accuracy and completeness, and I agree with the above.

## 2020-02-02 ENCOUNTER — Other Ambulatory Visit: Payer: Self-pay

## 2020-02-02 ENCOUNTER — Ambulatory Visit
Admission: RE | Admit: 2020-02-02 | Discharge: 2020-02-02 | Disposition: A | Discharge status: Home / Self Care | Payer: Medicare HMO | Source: Ambulatory Visit | Attending: Hematology and Oncology | Admitting: Hematology and Oncology

## 2020-02-02 DIAGNOSIS — R918 Other nonspecific abnormal finding of lung field: Secondary | ICD-10-CM | POA: Diagnosis not present

## 2020-02-03 ENCOUNTER — Encounter: Payer: Self-pay | Admitting: Hematology and Oncology

## 2020-02-03 ENCOUNTER — Other Ambulatory Visit: Payer: Self-pay

## 2020-02-03 ENCOUNTER — Inpatient Hospital Stay: Payer: Medicare HMO

## 2020-02-03 ENCOUNTER — Inpatient Hospital Stay: Payer: Medicare HMO | Attending: Hematology and Oncology | Admitting: Hematology and Oncology

## 2020-02-03 VITALS — BP 123/52 | HR 67 | Temp 96.1°F | Resp 18 | Wt 169.8 lb

## 2020-02-03 DIAGNOSIS — R918 Other nonspecific abnormal finding of lung field: Secondary | ICD-10-CM | POA: Diagnosis not present

## 2020-02-03 DIAGNOSIS — D500 Iron deficiency anemia secondary to blood loss (chronic): Secondary | ICD-10-CM | POA: Diagnosis not present

## 2020-02-03 DIAGNOSIS — E538 Deficiency of other specified B group vitamins: Secondary | ICD-10-CM

## 2020-02-03 DIAGNOSIS — N83202 Unspecified ovarian cyst, left side: Secondary | ICD-10-CM | POA: Diagnosis not present

## 2020-02-03 DIAGNOSIS — D649 Anemia, unspecified: Secondary | ICD-10-CM

## 2020-02-03 MED ORDER — CYANOCOBALAMIN 1000 MCG/ML IJ SOLN
1000.0000 ug | Freq: Once | INTRAMUSCULAR | Status: AC
  Administered 2020-02-03: 1000 ug via INTRAMUSCULAR

## 2020-02-03 NOTE — Patient Instructions (Signed)

## 2020-02-03 NOTE — Telephone Encounter (Signed)
EGD is scheduled on 02/27/2020 for patient

## 2020-02-03 NOTE — Addendum Note (Signed)
Addended by: Ulyess Blossom L on: 02/03/2020 04:02 PM   Modules accepted: Orders

## 2020-02-16 ENCOUNTER — Ambulatory Visit: Payer: Medicare HMO

## 2020-02-17 ENCOUNTER — Telehealth: Payer: Self-pay

## 2020-02-17 NOTE — Telephone Encounter (Signed)
Contacted patient to reschedule her EGD originally scheduled with Dr. Marius Ditch on 02/27/20.  Due to scope schedule change she has been asked to reschedule.  Patient has agreed to rescheduling.  Her new date is now 03/08/20.  Patient has been advised of COVID Test date 03/04/20.  New instructions will be mailed to her to reflect date change. Trish in Endo has been notified of date change.  Thanks,  [place], [place]

## 2020-02-23 DIAGNOSIS — R69 Illness, unspecified: Secondary | ICD-10-CM | POA: Diagnosis not present

## 2020-02-27 ENCOUNTER — Other Ambulatory Visit: Payer: Self-pay | Admitting: Gastroenterology

## 2020-02-27 DIAGNOSIS — I8511 Secondary esophageal varices with bleeding: Secondary | ICD-10-CM

## 2020-02-27 DIAGNOSIS — R188 Other ascites: Secondary | ICD-10-CM

## 2020-02-27 DIAGNOSIS — I8510 Secondary esophageal varices without bleeding: Secondary | ICD-10-CM

## 2020-02-27 DIAGNOSIS — D500 Iron deficiency anemia secondary to blood loss (chronic): Secondary | ICD-10-CM

## 2020-02-27 DIAGNOSIS — K7460 Unspecified cirrhosis of liver: Secondary | ICD-10-CM

## 2020-02-27 NOTE — Telephone Encounter (Signed)
Last office visit 12/17/2019 cirrhosis  Last refill 11/26/2019 1 refills 60 tab

## 2020-03-02 ENCOUNTER — Inpatient Hospital Stay: Payer: Medicare HMO | Attending: Hematology and Oncology

## 2020-03-02 ENCOUNTER — Other Ambulatory Visit: Payer: Self-pay

## 2020-03-02 VITALS — BP 134/68 | HR 69 | Temp 97.6°F | Resp 18

## 2020-03-02 DIAGNOSIS — E538 Deficiency of other specified B group vitamins: Secondary | ICD-10-CM | POA: Insufficient documentation

## 2020-03-02 MED ORDER — CYANOCOBALAMIN 1000 MCG/ML IJ SOLN
1000.0000 ug | Freq: Once | INTRAMUSCULAR | Status: AC
  Administered 2020-03-02: 1000 ug via INTRAMUSCULAR

## 2020-03-02 NOTE — Patient Instructions (Signed)

## 2020-03-04 ENCOUNTER — Other Ambulatory Visit
Admission: RE | Admit: 2020-03-04 | Discharge: 2020-03-04 | Disposition: A | Discharge status: Home / Self Care | Payer: Medicare HMO | Source: Ambulatory Visit | Attending: Gastroenterology | Admitting: Gastroenterology

## 2020-03-04 ENCOUNTER — Other Ambulatory Visit: Payer: Self-pay

## 2020-03-04 DIAGNOSIS — Z01812 Encounter for preprocedural laboratory examination: Secondary | ICD-10-CM | POA: Diagnosis not present

## 2020-03-04 DIAGNOSIS — Z20822 Contact with and (suspected) exposure to covid-19: Secondary | ICD-10-CM | POA: Diagnosis not present

## 2020-03-04 LAB — SARS CORONAVIRUS 2 (TAT 6-24 HRS): SARS Coronavirus 2: NEGATIVE

## 2020-03-08 ENCOUNTER — Encounter: Payer: Self-pay | Admitting: Gastroenterology

## 2020-03-08 ENCOUNTER — Ambulatory Visit: Payer: Medicare HMO | Admitting: Anesthesiology

## 2020-03-08 ENCOUNTER — Telehealth: Payer: Self-pay

## 2020-03-08 ENCOUNTER — Encounter
Admission: RE | Disposition: A | Discharge status: Home / Self Care | Payer: Self-pay | Source: Home / Self Care | Attending: Gastroenterology

## 2020-03-08 ENCOUNTER — Ambulatory Visit
Admission: RE | Admit: 2020-03-08 | Discharge: 2020-03-08 | Disposition: A | Discharge status: Home / Self Care | Payer: Medicare HMO | Attending: Gastroenterology | Admitting: Gastroenterology

## 2020-03-08 ENCOUNTER — Other Ambulatory Visit: Payer: Self-pay

## 2020-03-08 DIAGNOSIS — K31819 Angiodysplasia of stomach and duodenum without bleeding: Secondary | ICD-10-CM

## 2020-03-08 DIAGNOSIS — K31811 Angiodysplasia of stomach and duodenum with bleeding: Secondary | ICD-10-CM | POA: Diagnosis not present

## 2020-03-08 DIAGNOSIS — I7300 Raynaud's syndrome without gangrene: Secondary | ICD-10-CM | POA: Diagnosis not present

## 2020-03-08 DIAGNOSIS — K7460 Unspecified cirrhosis of liver: Secondary | ICD-10-CM | POA: Insufficient documentation

## 2020-03-08 DIAGNOSIS — D696 Thrombocytopenia, unspecified: Secondary | ICD-10-CM | POA: Diagnosis not present

## 2020-03-08 DIAGNOSIS — Z9842 Cataract extraction status, left eye: Secondary | ICD-10-CM | POA: Diagnosis not present

## 2020-03-08 DIAGNOSIS — I509 Heart failure, unspecified: Secondary | ICD-10-CM | POA: Diagnosis not present

## 2020-03-08 DIAGNOSIS — K766 Portal hypertension: Secondary | ICD-10-CM | POA: Diagnosis not present

## 2020-03-08 DIAGNOSIS — R911 Solitary pulmonary nodule: Secondary | ICD-10-CM | POA: Diagnosis not present

## 2020-03-08 DIAGNOSIS — I8510 Secondary esophageal varices without bleeding: Secondary | ICD-10-CM | POA: Insufficient documentation

## 2020-03-08 DIAGNOSIS — Z79899 Other long term (current) drug therapy: Secondary | ICD-10-CM | POA: Insufficient documentation

## 2020-03-08 DIAGNOSIS — E119 Type 2 diabetes mellitus without complications: Secondary | ICD-10-CM | POA: Diagnosis not present

## 2020-03-08 DIAGNOSIS — E538 Deficiency of other specified B group vitamins: Secondary | ICD-10-CM | POA: Insufficient documentation

## 2020-03-08 DIAGNOSIS — Z961 Presence of intraocular lens: Secondary | ICD-10-CM | POA: Insufficient documentation

## 2020-03-08 DIAGNOSIS — K279 Peptic ulcer, site unspecified, unspecified as acute or chronic, without hemorrhage or perforation: Secondary | ICD-10-CM | POA: Diagnosis not present

## 2020-03-08 DIAGNOSIS — D649 Anemia, unspecified: Secondary | ICD-10-CM

## 2020-03-08 DIAGNOSIS — Z820 Family history of epilepsy and other diseases of the nervous system: Secondary | ICD-10-CM | POA: Diagnosis not present

## 2020-03-08 DIAGNOSIS — Z809 Family history of malignant neoplasm, unspecified: Secondary | ICD-10-CM | POA: Diagnosis not present

## 2020-03-08 DIAGNOSIS — I2510 Atherosclerotic heart disease of native coronary artery without angina pectoris: Secondary | ICD-10-CM | POA: Diagnosis not present

## 2020-03-08 DIAGNOSIS — Z8249 Family history of ischemic heart disease and other diseases of the circulatory system: Secondary | ICD-10-CM | POA: Insufficient documentation

## 2020-03-08 DIAGNOSIS — Z9841 Cataract extraction status, right eye: Secondary | ICD-10-CM | POA: Insufficient documentation

## 2020-03-08 DIAGNOSIS — K760 Fatty (change of) liver, not elsewhere classified: Secondary | ICD-10-CM | POA: Diagnosis not present

## 2020-03-08 DIAGNOSIS — D500 Iron deficiency anemia secondary to blood loss (chronic): Secondary | ICD-10-CM | POA: Diagnosis not present

## 2020-03-08 DIAGNOSIS — Z8619 Personal history of other infectious and parasitic diseases: Secondary | ICD-10-CM | POA: Diagnosis not present

## 2020-03-08 HISTORY — PX: ESOPHAGOGASTRODUODENOSCOPY (EGD) WITH PROPOFOL: SHX5813

## 2020-03-08 LAB — GLUCOSE, CAPILLARY: Glucose-Capillary: 60 mg/dL — ABNORMAL LOW (ref 70–99)

## 2020-03-08 SURGERY — ESOPHAGOGASTRODUODENOSCOPY (EGD) WITH PROPOFOL
Anesthesia: General

## 2020-03-08 MED ORDER — LIDOCAINE HCL (PF) 1 % IJ SOLN
INTRAMUSCULAR | Status: AC
  Filled 2020-03-08: qty 2

## 2020-03-08 MED ORDER — PROPOFOL 10 MG/ML IV BOLUS
INTRAVENOUS | Status: DC | PRN
Start: 2020-03-08 — End: 2020-03-08
  Administered 2020-03-08: 50 mg via INTRAVENOUS

## 2020-03-08 MED ORDER — SODIUM CHLORIDE 0.9 % IV SOLN: INTRAVENOUS | Status: DC

## 2020-03-08 MED ORDER — PHENYLEPHRINE HCL (PRESSORS) 10 MG/ML IV SOLN
INTRAVENOUS | Status: DC | PRN
  Administered 2020-03-08: 100 ug via INTRAVENOUS

## 2020-03-08 MED ORDER — LIDOCAINE HCL (CARDIAC) PF 100 MG/5ML IV SOSY
PREFILLED_SYRINGE | INTRAVENOUS | Status: DC | PRN
  Administered 2020-03-08: 50 mg via INTRAVENOUS

## 2020-03-08 MED ORDER — PROPOFOL 500 MG/50ML IV EMUL
INTRAVENOUS | Status: DC | PRN
  Administered 2020-03-08: 165 ug/kg/min via INTRAVENOUS

## 2020-03-08 MED ORDER — DEXTROSE 50 % IV SOLN
INTRAVENOUS | Status: DC | PRN
  Administered 2020-03-08: 12.5 mL via INTRAVENOUS

## 2020-03-08 MED ORDER — DEXTROSE 50 % IV SOLN
INTRAVENOUS | Status: AC
  Filled 2020-03-08: qty 50

## 2020-03-08 MED ORDER — PROPOFOL 500 MG/50ML IV EMUL
INTRAVENOUS | Status: AC
  Filled 2020-03-08: qty 50

## 2020-03-08 MED ORDER — PROPOFOL 10 MG/ML IV BOLUS
INTRAVENOUS | Status: AC
  Filled 2020-03-08: qty 20

## 2020-03-08 NOTE — Transfer of Care (Signed)
Immediate Anesthesia Transfer of Care Note  Patient: Trini A Emrich  Procedure(s) Performed: ESOPHAGOGASTRODUODENOSCOPY (EGD) WITH PROPOFOL (N/A )  Patient Location: PACU  Anesthesia Type:General  Level of Consciousness: awake, alert  and oriented  Airway & Oxygen Therapy: Patient Spontanous Breathing and Patient connected to nasal cannula oxygen  Post-op Assessment: Report given to RN and Post -op Vital signs reviewed and stable  Post vital signs: Reviewed and stable  Last Vitals:  Vitals Value Taken Time  BP    Temp    Pulse 73 03/08/20 1011  Resp 17 03/08/20 1011  SpO2 100 % 03/08/20 1011  Vitals shown include unvalidated device data.  Last Pain:  Vitals:   03/08/20 0838  TempSrc: Temporal  PainSc: 0-No pain         Complications: No apparent anesthesia complications

## 2020-03-08 NOTE — Anesthesia Preprocedure Evaluation (Addendum)
Anesthesia Evaluation  Patient identified by MRN, date of birth, ID band Patient awake    Reviewed: Allergy & Precautions, H&P , NPO status , Patient's Chart, lab work & pertinent test results  Airway Mallampati: II  TM Distance: >3 FB     Dental  (+) Edentulous Upper, Edentulous Lower   Pulmonary neg pulmonary ROS, neg COPD,    breath sounds clear to auscultation       Cardiovascular (-) angina+ CAD   Rhythm:regular Rate:Normal     Neuro/Psych negative neurological ROS  negative psych ROS   GI/Hepatic PUD, (+) Cirrhosis   Esophageal Varices    , Hepatitis -, CPortal HTN s/p TIPS   Endo/Other  diabetes  Renal/GU negative Renal ROS  negative genitourinary   Musculoskeletal   Abdominal   Peds  Hematology  (+) Blood dyscrasia, anemia , thrombocytopenia   Anesthesia Other Findings Past Medical History: 02/09/2018: Acute GI bleeding 08/03/2018: Acute upper gastrointestinal bleeding No date: Anemia     Comment:  TAKES IRON TAB No date: Arthritis     Comment:  SHOULDER 02/18/2018: B12 deficiency No date: Bleeding     Comment:  GI  3/19 No date: Bronchitis     Comment:  HX OF No date: Cirrhosis (HCC) No date: Colon cancer screening No date: Diabetes mellitus without complication (HCC)     Comment:  TYPE 2 No date: Fatty (change of) liver, not elsewhere classified No date: Full dentures     Comment:  UPPER AND LOWER 02/18/2018: Iron deficiency anemia due to chronic blood loss No date: Lung nodule, multiple 02/22/2018: Pernicious anemia No date: PUD (peptic ulcer disease) No date: Raynaud's syndrome 08/03/2018: Upper GI bleeding  Past Surgical History: 02/06/2018: CATARACT EXTRACTION W/PHACO; Right     Comment:  Procedure: CATARACT EXTRACTION PHACO AND INTRAOCULAR               LENS PLACEMENT ([employer]) RIGHT DIABETIC;  Surgeon:               Leandrew Koyanagi, MD;  Location: [place];               Service: Ophthalmology;  Laterality: Right;  DIABETIC,               ORAL MED 04/30/2018: CATARACT EXTRACTION W/PHACO; Left     Comment:  Procedure: CATARACT EXTRACTION PHACO AND INTRAOCULAR               LENS PLACEMENT ([facility]);  Surgeon: Leandrew Koyanagi, MD;              Location: [facility];  Service: Ophthalmology;  Laterality:              Left;  [place] 01:04 AP% 21.3 CDE 13.66 Fluid pack lot #               [id number] 03/25/2018: COLONOSCOPY WITH PROPOFOL; N/A     Comment:  Procedure: COLONOSCOPY WITH PROPOFOL;  Surgeon: Lin Landsman, MD;  Location: [facility];  Service:               Gastroenterology;  Laterality: N/A; No date: DENTAL SURGERY     Comment:  EXTRACTIONS 01/27/2019: ELECTROMAGNETIC NAVIGATION BROCHOSCOPY; Right     Comment:  Procedure: ELECTROMAGNETIC NAVIGATION BRONCHOSCOPY;                Surgeon: [name], MD;  Location: [facility];  Service:  Cardiopulmonary;  Laterality: Right; 08/03/2018: ESOPHAGOGASTRODUODENOSCOPY; N/A     Comment:  Procedure: ESOPHAGOGASTRODUODENOSCOPY (EGD);  Surgeon:               Lin Landsman, MD;  Location: [facility];                Service: Gastroenterology;  Laterality: N/A; 02/10/2018: ESOPHAGOGASTRODUODENOSCOPY (EGD) WITH PROPOFOL; N/A     Comment:  Procedure: ESOPHAGOGASTRODUODENOSCOPY (EGD) WITH               PROPOFOL;  Surgeon: Jonathon Bellows, MD;  Location: [facility]               ENDOSCOPY;  Service: Gastroenterology;  Laterality: N/A; 03/25/2018: ESOPHAGOGASTRODUODENOSCOPY (EGD) WITH PROPOFOL; N/A     Comment:  Procedure: ESOPHAGOGASTRODUODENOSCOPY (EGD) WITH               PROPOFOL;  Surgeon: Lin Landsman, MD;  Location:               [facility];  Service: Gastroenterology;  Laterality:               N/A; 04/22/2018: ESOPHAGOGASTRODUODENOSCOPY (EGD) WITH PROPOFOL; N/A     Comment:  Procedure: ESOPHAGOGASTRODUODENOSCOPY (EGD) WITH               PROPOFOL with band ligation;  Surgeon:  Lin Landsman, MD;  Location: [place];  Service:               Gastroenterology;  Laterality: N/A; 06/24/2018: ESOPHAGOGASTRODUODENOSCOPY (EGD) WITH PROPOFOL; N/A     Comment:  Procedure: ESOPHAGOGASTRODUODENOSCOPY (EGD) WITH               PROPOFOL;  Surgeon: Lin Landsman, MD;  Location:               [facility];  Service: Gastroenterology;  Laterality:               N/A; 11/19/2018: ESOPHAGOGASTRODUODENOSCOPY (EGD) WITH PROPOFOL; N/A     Comment:  Procedure: ESOPHAGOGASTRODUODENOSCOPY (EGD) WITH               PROPOFOL;  Surgeon: Lin Landsman, MD;  Location:               [facility];  Service: Gastroenterology;  Laterality:               N/A; No date: EYE SURGERY 03/25/2018: HEMORRHOID BANDING     Comment:  Procedure: HEMORRHOID BANDING;  Surgeon: Lin Landsman, MD;  Location: [place];  Service:               Gastroenterology;; 08/07/2018: IR EMBO ART  VEN HEMORR LYMPH EXTRAV  INC GUIDE ROADMAPPING 09/03/2018: IR RADIOLOGIST EVAL & MGMT 12/17/2018: IR RADIOLOGIST EVAL & MGMT 06/24/2019: IR RADIOLOGIST EVAL & MGMT 01/22/2020: IR RADIOLOGIST EVAL & MGMT 08/07/2018: IR TIPS 08/07/2018: RADIOLOGY WITH ANESTHESIA; N/A     Comment:  Procedure: TIPS;  Surgeon: Corrie Mckusick, DO;  Location:              [place];  Service: Anesthesiology;  Laterality: N/A; No date: TONSILLECTOMY     Reproductive/Obstetrics negative OB ROS  Anesthesia Physical Anesthesia Plan  ASA: III  Anesthesia Plan: General   Post-op Pain Management:    Induction:   PONV Risk Score and Plan: Propofol infusion and TIVA  Airway Management Planned: Natural Airway and Nasal Cannula  Additional Equipment:   Intra-op Plan:   Post-operative Plan:   Informed Consent: I have reviewed the patients History and Physical, chart, labs and discussed the procedure including the risks, benefits and  alternatives for the proposed anesthesia with the patient or authorized representative who has indicated his/her understanding and acceptance.     Dental Advisory Given  Plan Discussed with: Anesthesiologist  Anesthesia Plan Comments:         Anesthesia Quick Evaluation

## 2020-03-08 NOTE — Telephone Encounter (Signed)
Per EGD report patient needs a capsule study scheduled. Tried to call patient to scheduled but patient voicemail is full

## 2020-03-08 NOTE — H&P (Signed)
[name], MD [address]  [place]  [place], [place] [address]  Main: [phone]  Fax: [phone] Pager: [phone]  Primary Care Physician:  Lorelee Market, MD Primary Gastroenterologist:  Dr. [name]  Pre-Procedure History & Physical: HPI:  Crystal [name] is a 69 y.o. female is here for an endoscopy.   Past Medical History:  Diagnosis Date  . Acute GI bleeding 02/09/2018  . Acute upper gastrointestinal bleeding 08/03/2018  . Anemia    TAKES IRON TAB  . Arthritis    SHOULDER  . B12 deficiency 02/18/2018  . Bleeding    GI  3/19  . Bronchitis    HX OF  . Cirrhosis ([place])   . Colon cancer screening   . Diabetes mellitus without complication ([place])    TYPE 2  . Fatty (change of) liver, not elsewhere classified   . Full dentures    UPPER AND LOWER  . Iron deficiency anemia due to chronic blood loss 02/18/2018  . Lung nodule, multiple   . Pernicious anemia 02/22/2018  . PUD (peptic ulcer disease)   . Raynaud's syndrome   . Upper GI bleeding 08/03/2018    Past Surgical History:  Procedure Laterality Date  . CATARACT EXTRACTION W/PHACO Right 02/06/2018   Procedure: CATARACT EXTRACTION PHACO AND INTRAOCULAR LENS PLACEMENT ([place]) RIGHT DIABETIC;  Surgeon: Leandrew Koyanagi, MD;  Location: [place];  Service: Ophthalmology;  Laterality: Right;  DIABETIC, ORAL MED  . CATARACT EXTRACTION W/PHACO Left 04/30/2018   Procedure: CATARACT EXTRACTION PHACO AND INTRAOCULAR LENS PLACEMENT ([facility]);  Surgeon: Leandrew Koyanagi, MD;  Location: [facility];  Service: Ophthalmology;  Laterality: Left;  [place] 01:04 AP% 21.3 CDE 13.66 Fluid pack lot # [id number]  . COLONOSCOPY WITH PROPOFOL N/A 03/25/2018   Procedure: COLONOSCOPY WITH PROPOFOL;  Surgeon: Lin Landsman, MD;  Location: [facility] ENDOSCOPY;  Service: Gastroenterology;  Laterality: N/A;  . DENTAL SURGERY     EXTRACTIONS  . ELECTROMAGNETIC NAVIGATION BROCHOSCOPY Right 01/27/2019   Procedure:  ELECTROMAGNETIC NAVIGATION BRONCHOSCOPY;  Surgeon: [name], MD;  Location: [facility];  Service: Cardiopulmonary;  Laterality: Right;  . ESOPHAGOGASTRODUODENOSCOPY N/A 08/03/2018   Procedure: ESOPHAGOGASTRODUODENOSCOPY (EGD);  Surgeon: Lin Landsman, MD;  Location: [facility] ENDOSCOPY;  Service: Gastroenterology;  Laterality: N/A;  . ESOPHAGOGASTRODUODENOSCOPY (EGD) WITH PROPOFOL N/A 02/10/2018   Procedure: ESOPHAGOGASTRODUODENOSCOPY (EGD) WITH PROPOFOL;  Surgeon: Jonathon Bellows, MD;  Location: [facility] ENDOSCOPY;  Service: Gastroenterology;  Laterality: N/A;  . ESOPHAGOGASTRODUODENOSCOPY (EGD) WITH PROPOFOL N/A 03/25/2018   Procedure: ESOPHAGOGASTRODUODENOSCOPY (EGD) WITH PROPOFOL;  Surgeon: Lin Landsman, MD;  Location: [facility] ENDOSCOPY;  Service: Gastroenterology;  Laterality: N/A;  . ESOPHAGOGASTRODUODENOSCOPY (EGD) WITH PROPOFOL N/A 04/22/2018   Procedure: ESOPHAGOGASTRODUODENOSCOPY (EGD) WITH PROPOFOL with band ligation;  Surgeon: Lin Landsman, MD;  Location: [place];  Service: Gastroenterology;  Laterality: N/A;  . ESOPHAGOGASTRODUODENOSCOPY (EGD) WITH PROPOFOL N/A 06/24/2018   Procedure: ESOPHAGOGASTRODUODENOSCOPY (EGD) WITH PROPOFOL;  Surgeon: Lin Landsman, MD;  Location: [facility] ENDOSCOPY;  Service: Gastroenterology;  Laterality: N/A;  . ESOPHAGOGASTRODUODENOSCOPY (EGD) WITH PROPOFOL N/A 11/19/2018   Procedure: ESOPHAGOGASTRODUODENOSCOPY (EGD) WITH PROPOFOL;  Surgeon: Lin Landsman, MD;  Location: [facility] ENDOSCOPY;  Service: Gastroenterology;  Laterality: N/A;  . EYE SURGERY    . HEMORRHOID BANDING  03/25/2018   Procedure: HEMORRHOID BANDING;  Surgeon: Lin Landsman, MD;  Location: [facility] ENDOSCOPY;  Service: Gastroenterology;;  . IR EMBO ART  VEN HEMORR LYMPH EXTRAV  INC GUIDE ROADMAPPING  08/07/2018  . IR RADIOLOGIST EVAL & MGMT  09/03/2018  .  IR RADIOLOGIST EVAL & MGMT  12/17/2018  . IR RADIOLOGIST EVAL & MGMT  06/24/2019  . IR RADIOLOGIST EVAL & MGMT  01/22/2020  . IR  TIPS  08/07/2018  . RADIOLOGY WITH ANESTHESIA N/A 08/07/2018   Procedure: TIPS;  Surgeon: Corrie Mckusick, DO;  Location: [place];  Service: Anesthesiology;  Laterality: N/A;  . TONSILLECTOMY      Prior to Admission medications   Medication Sig Start Date End Date Taking? Authorizing Provider  cyanocobalamin (,VITAMIN B-12,) 1000 MCG/ML injection Inject 1,000 mcg into the muscle every 30 (thirty) days.    Yes [provider]  fluconazole (DIFLUCAN) 150 MG tablet Take 150 mg by mouth daily. 10/29/19  Yes [provider]  furosemide (LASIX) 40 MG tablet Take 1 tablet (40 mg total) by mouth daily. 01/19/20 04/18/20 Yes , Tally Due, MD  hydrOXYzine (ATARAX/VISTARIL) 10 MG tablet TAKE 1 TABLET (10 MG TOTAL) BY MOUTH 3 (THREE) TIMES DAILY AS NEEDED FOR ITCHING. 11/03/19  Yes , Tally Due, MD  lactulose (CHRONULAC) 10 GM/15ML solution TAKE 30 MLS (20 G TOTAL) BY MOUTH 2 (TWO) TIMES DAILY. 01/05/20  Yes , Tally Due, MD  metFORMIN (GLUCOPHAGE) 500 MG tablet Take 1,000 mg by mouth 2 (two) times daily with a meal.    Yes [provider]  Misc. Devices KIT Moderate compression hose 20-30 MM HG 10/14/19  Yes , Tally Due, MD  nystatin (MYCOSTATIN/NYSTOP) powder Apply 1 g topically as needed (rash).  05/23/18  Yes [provider]  ondansetron (ZOFRAN) 4 MG tablet ondansetron HCl 4 mg tablet  TAKE 1 TABLET BY MOUTH EVERY 6 HOURS AS NEEDED   Yes [provider]  OneTouch Delica Lancets 47Q MISC 2 (two) times daily. 12/01/19  Yes [provider]  ONETOUCH ULTRA test strip 2 (two) times daily. 11/23/19  Yes [provider]  pantoprazole (PROTONIX) 40 MG tablet Take 40 mg by mouth 2 (two) times daily. 06/29/19  Yes [provider]  rifaximin (XIFAXAN) 550 MG TABS tablet TAKE 1 TABLET (550 MG TOTAL) BY MOUTH 2 (TWO) TIMES DAILY. 10/20/19  Yes , Tally Due, MD  spironolactone (ALDACTONE) 50 MG tablet TAKE 1 TABLET BY MOUTH  EVERY DAY 02/27/20  Yes , Tally Due, MD  VENTOLIN HFA 108 [phone] Base) MCG/ACT inhaler SMARTSIG:2 Puff(s) By Mouth Every 4 Hours PRN 10/28/19  Yes [provider]  Vitamin D, Ergocalciferol, (DRISDOL) 1.25 MG (50000 UT) CAPS capsule TAKE 1 CAPSULE BY MOUTH ONE TIME PER WEEK 08/11/19  Yes , Tally Due, MD  mupirocin ointment (BACTROBAN) 2 % mupirocin 2 % topical ointment    [provider]  potassium chloride (KLOR-CON 10) 10 MEQ tablet Take 1 tablet (10 mEq total) by mouth daily for 4 days. 08/22/19 12/31/19  Saundra Shelling, MD    Allergies as of 02/04/2020  . (No Known Allergies)    Family History  Problem Relation Age of Onset  . CAD Father   . Heart attack Father 78  . Cancer Maternal Uncle   . Seizures Mother     Social History   Socioeconomic History  . Marital status: Single    Spouse name: Not on file  . Number of children: Not on file  . Years of education: Not on file  . Highest education level: Not on file  Occupational History  . Not on file  Tobacco Use  . Smoking status: Never Smoker  . Smokeless tobacco: Never Used  Substance and Sexual Activity  . Alcohol  use: Not Currently    Comment: No EtOH for 30 years.  Never a heavy drinker.  . Drug use: Never  . Sexual activity: Not on file  Other Topics Concern  . Not on file  Social History Narrative   Independent at baseline. Lives at home with family   Social Determinants of Health   Financial Resource Strain:   . Difficulty of Paying Living Expenses:   Food Insecurity:   . Worried About Charity fundraiser in the Last Year:   . Arboriculturist in the Last Year:   Transportation Needs:   . Film/video editor (Medical):   Marland Kitchen Lack of Transportation (Non-Medical):   Physical Activity:   . Days of Exercise per Week:   . Minutes of Exercise per Session:   Stress:   . Feeling of Stress :   Social Connections:   . Frequency of Communication with Friends and Family:   .  Frequency of Social Gatherings with Friends and Family:   . Attends Religious Services:   . Active Member of Clubs or Organizations:   . Attends Archivist Meetings:   Marland Kitchen Marital Status:   Intimate Partner Violence:   . Fear of Current or Ex-Partner:   . Emotionally Abused:   Marland Kitchen Physically Abused:   . Sexually Abused:     Review of Systems: See HPI, otherwise negative ROS  Physical Exam: BP (!) 135/56   Pulse 72   Temp (!) 96.9 F (36.1 C) (Temporal)   Resp 18   Ht 5' 3" (1.6 m)   Wt 79.4 kg   LMP  (LMP Unknown)   SpO2 100%   BMI 31.00 kg/m  General:   Alert,  pleasant and cooperative in NAD Head:  Normocephalic and atraumatic. Neck:  Supple; no masses or thyromegaly. Lungs:  Clear throughout to auscultation.    Heart:  Regular rate and rhythm. Abdomen:  Soft, nontender and nondistended. Normal bowel sounds, without guarding, and without rebound.   Neurologic:  Alert and  oriented x4;  grossly normal neurologically.  Impression/Plan: Crystal [name] is here for an endoscopy to be performed for anemia, h/o GAVE  Risks, benefits, limitations, and alternatives regarding  endoscopy have been reviewed with the patient.  Questions have been answered.  All parties agreeable.   Sherri Sear, MD  03/08/2020, 9:15 AM

## 2020-03-08 NOTE — Op Note (Signed)
[facility] Gastroenterology Patient Name: Crystal [name] Procedure Date: 03/08/2020 9:33 AM MRN: [id number] Account #: [id number] Date of Birth: 1951/09/30 Admit Type: Outpatient Age: 69 Room: [facility] ENDO ROOM 1 Gender: Female Note Status: Finalized Procedure:             Upper GI endoscopy Indications:           Iron deficiency anemia secondary to chronic blood                         loss, Angioectasia of the stomach Providers:             Lin Landsman MD, MD Medicines:             Monitored Anesthesia Care Complications:         No immediate complications. Estimated blood loss: None. Procedure:             Pre-Anesthesia Assessment:                        - Prior to the procedure, a History and Physical was                         performed, and patient medications and allergies were                         reviewed. The patient is competent. The risks and                         benefits of the procedure and the sedation options and                         risks were discussed with the patient. All questions                         were answered and informed consent was obtained.                         Patient identification and proposed procedure were                         verified by the physician, the nurse, the                         anesthesiologist, the anesthetist and the technician                         in the pre-procedure area in the procedure room in the                         endoscopy suite. Mental Status Examination: alert and                         oriented. Airway Examination: normal oropharyngeal                         airway and neck mobility. Respiratory Examination:                         clear to auscultation.  CV Examination: normal.                         Prophylactic Antibiotics: The patient does not require                         prophylactic antibiotics. Prior Anticoagulants: The                         patient has taken no  previous anticoagulant or                         antiplatelet agents. ASA Grade Assessment: III - A                         patient with severe systemic disease. After reviewing                         the risks and benefits, the patient was deemed in                         satisfactory condition to undergo the procedure. The                         anesthesia plan was to use monitored anesthesia care                         (MAC). Immediately prior to administration of                         medications, the patient was re-assessed for adequacy                         to receive sedatives. The heart rate, respiratory                         rate, oxygen saturations, blood pressure, adequacy of                         pulmonary ventilation, and response to care were                         monitored throughout the procedure. The physical                         status of the patient was re-assessed after the                         procedure.                        After obtaining informed consent, the endoscope was                         passed under direct vision. Throughout the procedure,                         the patient's blood pressure, pulse, and oxygen  saturations were monitored continuously. The Endoscope                         was introduced through the mouth, and advanced to the                         second part of duodenum. The upper GI endoscopy was                         accomplished without difficulty. The patient tolerated                         the procedure well. Findings:      A few small angioectasias without bleeding were found in the second       portion of the duodenum. Coagulation for hemostasis using argon plasma       was successful.      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 Coagulation for hemostasis       using argon plasma was successful.       A single 5 mm angioectasia with no bleeding was found in the gastric       body. Fulguration to ablate the lesion by bipolar probe was successful.      The gastroesophageal junction and examined esophagus were normal. Impression:            - A few non-bleeding angioectasias in the duodenum.                         Treated with argon plasma coagulation (APC).                        - Gastric antral vascular ectasia with bleeding.                         Treated with argon plasma coagulation (APC).                        - A single non-bleeding angioectasia in the stomach.                         Treated with bipolar cautery.                        - Normal gastroesophageal junction and esophagus.                        - No specimens collected. Recommendation:        - Discharge patient to home (with escort).                        - Resume previous diet today.                        - Continue present medications.                        - To visualize the small bowel, perform video capsule  endoscopy at appointment to be scheduled.                        - Continue present medications. Procedure Code(s):     --- Professional ---                        [phone], Esophagogastroduodenoscopy, flexible,                         transoral; with ablation of tumor(s), polyp(s), or                         other lesion(s) (includes pre- and post-dilation and                         guide wire passage, when performed)                        [id number], 68, Esophagogastroduodenoscopy, flexible,                         transoral; with control of bleeding, any method Diagnosis Code(s):     --- Professional ---                        K31.811, Angiodysplasia of stomach and duodenum with                         bleeding                        D50.0, Iron deficiency anemia secondary to blood loss                         (chronic) CPT copyright 2019 American Medical Association. All rights  reserved. The codes documented in this report are preliminary and upon coder review may  be revised to meet current compliance requirements. Dr. Ulyess Mort Lin Landsman MD, MD 03/08/2020 10:09:43 AM This report has been signed electronically. Number of Addenda: 0 Note Initiated On: 03/08/2020 9:33 AM Estimated Blood Loss:  Estimated blood loss: none.      [facility]

## 2020-03-08 NOTE — Anesthesia Postprocedure Evaluation (Signed)
Anesthesia Post Note  Patient: Crystal [name]  Procedure(s) Performed: ESOPHAGOGASTRODUODENOSCOPY (EGD) WITH PROPOFOL (N/A )  Patient location during evaluation: PACU Anesthesia Type: General Level of consciousness: awake and alert Pain management: pain level controlled Vital Signs Assessment: post-procedure vital signs reviewed and stable Respiratory status: spontaneous breathing, nonlabored ventilation and respiratory function stable Cardiovascular status: blood pressure returned to baseline and stable Postop Assessment: no apparent nausea or vomiting Anesthetic complications: no     Last Vitals:  Vitals:   03/08/20 1020 03/08/20 1030  BP: (!) 141/63 136/63  Pulse: 68 64  Resp: 14 11  Temp:    SpO2: 100% 100%    Last Pain:  Vitals:   03/08/20 1010  TempSrc: Temporal  PainSc:                  [name]

## 2020-03-09 ENCOUNTER — Ambulatory Visit: Payer: Medicare HMO | Admitting: Gastroenterology

## 2020-03-09 ENCOUNTER — Encounter: Payer: Self-pay | Admitting: *Deleted

## 2020-03-09 ENCOUNTER — Other Ambulatory Visit: Payer: Self-pay

## 2020-03-09 DIAGNOSIS — D509 Iron deficiency anemia, unspecified: Secondary | ICD-10-CM

## 2020-03-09 NOTE — Telephone Encounter (Signed)
Patient states she can do the procedure on 03/29/2020. Patient verbalized understanding of instructions

## 2020-03-11 DIAGNOSIS — D649 Anemia, unspecified: Secondary | ICD-10-CM | POA: Diagnosis not present

## 2020-03-11 DIAGNOSIS — E669 Obesity, unspecified: Secondary | ICD-10-CM | POA: Diagnosis not present

## 2020-03-11 DIAGNOSIS — E7800 Pure hypercholesterolemia, unspecified: Secondary | ICD-10-CM | POA: Diagnosis not present

## 2020-03-11 DIAGNOSIS — K922 Gastrointestinal hemorrhage, unspecified: Secondary | ICD-10-CM | POA: Diagnosis not present

## 2020-03-11 DIAGNOSIS — Z1389 Encounter for screening for other disorder: Secondary | ICD-10-CM | POA: Diagnosis not present

## 2020-03-11 DIAGNOSIS — E119 Type 2 diabetes mellitus without complications: Secondary | ICD-10-CM | POA: Diagnosis not present

## 2020-03-11 DIAGNOSIS — I10 Essential (primary) hypertension: Secondary | ICD-10-CM | POA: Diagnosis not present

## 2020-03-11 DIAGNOSIS — J449 Chronic obstructive pulmonary disease, unspecified: Secondary | ICD-10-CM | POA: Diagnosis not present

## 2020-03-15 ENCOUNTER — Ambulatory Visit: Payer: Medicare HMO

## 2020-03-17 ENCOUNTER — Encounter: Payer: Self-pay | Admitting: Hematology and Oncology

## 2020-03-22 DIAGNOSIS — D500 Iron deficiency anemia secondary to blood loss (chronic): Secondary | ICD-10-CM | POA: Diagnosis not present

## 2020-03-23 ENCOUNTER — Encounter: Payer: Self-pay | Admitting: Hematology and Oncology

## 2020-03-26 DIAGNOSIS — D500 Iron deficiency anemia secondary to blood loss (chronic): Secondary | ICD-10-CM | POA: Diagnosis not present

## 2020-03-29 ENCOUNTER — Encounter: Payer: Self-pay | Admitting: Hematology and Oncology

## 2020-03-29 ENCOUNTER — Encounter
Admission: RE | Disposition: A | Discharge status: Home / Self Care | Payer: Self-pay | Source: Home / Self Care | Attending: Gastroenterology

## 2020-03-29 ENCOUNTER — Other Ambulatory Visit: Payer: Self-pay

## 2020-03-29 ENCOUNTER — Encounter: Payer: Self-pay | Admitting: Gastroenterology

## 2020-03-29 ENCOUNTER — Ambulatory Visit
Admission: RE | Admit: 2020-03-29 | Discharge: 2020-03-29 | Disposition: A | Discharge status: Home / Self Care | Payer: Medicare HMO | Attending: Gastroenterology | Admitting: Gastroenterology

## 2020-03-29 DIAGNOSIS — D500 Iron deficiency anemia secondary to blood loss (chronic): Secondary | ICD-10-CM | POA: Diagnosis not present

## 2020-03-29 DIAGNOSIS — K5520 Angiodysplasia of colon without hemorrhage: Secondary | ICD-10-CM | POA: Diagnosis not present

## 2020-03-29 DIAGNOSIS — D509 Iron deficiency anemia, unspecified: Secondary | ICD-10-CM | POA: Insufficient documentation

## 2020-03-29 HISTORY — PX: GIVENS CAPSULE STUDY: SHX5432

## 2020-03-29 SURGERY — IMAGING PROCEDURE, GI TRACT, INTRALUMINAL, VIA CAPSULE

## 2020-03-29 NOTE — Progress Notes (Signed)
Patient had givens capsule study done 03/29/20. Patient states that she has had more fluid build up. She would like paper work for living will/POA when she comes into the office for her appointment tomorrow with Dr Mike Gip. She sleeps well when she takes her itch medicine.

## 2020-03-29 NOTE — Progress Notes (Signed)
[facility]  [address], [address] [place], [place] [address] Phone: [phone]  Fax: [phone]   Clinic Day:  03/30/2020  Referring physician: Lorelee Market, MD  Chief Complaint: Crystal [name] is a 69 y.o. female with tworight sided pulmonary nodules and lymphadenopathy who is seen for 3 month assessment.  HPI: The patient was last seen in the medical oncology clinic on 02/03/2020. At that time, she felt good.  She described an episode of being dizzy, nauseated, and confused.  She took lactulose and symptoms resolved.  Hematocrit was 31.4, hemoglobin 10.7, MCV 82, platelets 149,000, WBC 3700 with an ANC of 2200 on 01/27/2020.  She received monthly B12 (02/03/2020 - 03/02/2020).   Chest CT on 02/02/2020 revealed stable nodules within the right middle and right lower lobes.  There were cirrhotic changes of the liver with TIPS shunt in place.  EGD on 03/08/2020 with Dr. Marius Ditch revealed a few non-bleeding angioectasias in the duodenum and a gastric antral vascular ectasia with bleeding treated with argon plasma coagulation (APC).  There was a single non-bleeding angioectasia in the stomach treated with bipolar cautery. Gastroesophageal junction and esophagus were normal. No specimens were collected.  Labs followed: 03/11/2020: Hematocrit 28.0, hemoglobin 8.9, platelets 110,000, WBC 3,800. Creatinine 1.27 (CrCl 44 ml/min).  Albumin 2.6.  Alkaline phosphatase 126.  03/22/2020: Hematocrit 27.2, hemoglobin 8.9, platelets 123,000, WBC 3,700. Ferritin 22 with and iron saturation 17% and a TIBC of 224.  03/26/2020: Hematocrit 27.3, hemoglobin 9.3, platelets 133,000, WBC 3,100. Ferritin 20 with and iron saturation 27% and a TIBC of 250.  Sed rate 42.  During the interim, she has felt tired. Patient had capsule study done on 03/29/2020. The capsule is still inside but she took the monitor back. She denies any bloody or black stool. Patient states that she has had more  lower extremity edema. She denies ascites. She has a weight gain on 25 pounds. She notes some shortness of breath when walking. She sleeps well when she takes her "itch medicine". Her blood sugar has been running high these past few days.   Due to a ferritin of 20, patient agreed to IV iron today. She would like to have a ultrasound done as soon as possible. She would like paper work for living will/POA.    Patient received her COVID-19 vaccines (02/12/20201, 01/21/2020). She would like to travel to [place] since her brother is sick. She plans to be in [place] for 2 weeks. Her family has been vaccinated. I encouraged her to follow safety guidelines while traveling.    Past Medical History:  Diagnosis Date  . Acute GI bleeding 02/09/2018  . Acute upper gastrointestinal bleeding 08/03/2018  . Anemia    TAKES IRON TAB  . Arthritis    SHOULDER  . B12 deficiency 02/18/2018  . Bleeding    GI  3/19  . Bronchitis    HX OF  . Cirrhosis ([place])   . Colon cancer screening   . Diabetes mellitus without complication ([place])    TYPE 2  . Fatty (change of) liver, not elsewhere classified   . Full dentures    UPPER AND LOWER  . Iron deficiency anemia due to chronic blood loss 02/18/2018  . Lung nodule, multiple   . Pernicious anemia 02/22/2018  . PUD (peptic ulcer disease)   . Raynaud's syndrome   . Upper GI bleeding 08/03/2018    Past Surgical History:  Procedure Laterality Date  . CATARACT EXTRACTION W/PHACO Right 02/06/2018  Procedure: CATARACT EXTRACTION PHACO AND INTRAOCULAR LENS PLACEMENT ([place]) RIGHT DIABETIC;  Surgeon: Leandrew Koyanagi, MD;  Location: [place];  Service: Ophthalmology;  Laterality: Right;  DIABETIC, ORAL MED  . CATARACT EXTRACTION W/PHACO Left 04/30/2018   Procedure: CATARACT EXTRACTION PHACO AND INTRAOCULAR LENS PLACEMENT (I[facility]);  Surgeon: Leandrew Koyanagi, MD;  Location: [facility];  Service: Ophthalmology;  Laterality: Left;  [place] 01:04 AP% 21.3 CDE 13.66 Fluid  pack lot # [id number]  . COLONOSCOPY WITH PROPOFOL N/A 03/25/2018   Procedure: COLONOSCOPY WITH PROPOFOL;  Surgeon: Lin Landsman, MD;  Location: [facility] ENDOSCOPY;  Service: Gastroenterology;  Laterality: N/A;  . DENTAL SURGERY     EXTRACTIONS  . ELECTROMAGNETIC NAVIGATION BROCHOSCOPY Right 01/27/2019   Procedure: ELECTROMAGNETIC NAVIGATION BRONCHOSCOPY;  Surgeon: [name], MD;  Location: [facility];  Service: Cardiopulmonary;  Laterality: Right;  . ESOPHAGOGASTRODUODENOSCOPY N/A 08/03/2018   Procedure: ESOPHAGOGASTRODUODENOSCOPY (EGD);  Surgeon: Lin Landsman, MD;  Location: [facility] ENDOSCOPY;  Service: Gastroenterology;  Laterality: N/A;  . ESOPHAGOGASTRODUODENOSCOPY (EGD) WITH PROPOFOL N/A 02/10/2018   Procedure: ESOPHAGOGASTRODUODENOSCOPY (EGD) WITH PROPOFOL;  Surgeon: Jonathon Bellows, MD;  Location: [facility] ENDOSCOPY;  Service: Gastroenterology;  Laterality: N/A;  . ESOPHAGOGASTRODUODENOSCOPY (EGD) WITH PROPOFOL N/A 03/25/2018   Procedure: ESOPHAGOGASTRODUODENOSCOPY (EGD) WITH PROPOFOL;  Surgeon: Lin Landsman, MD;  Location: [facility] ENDOSCOPY;  Service: Gastroenterology;  Laterality: N/A;  . ESOPHAGOGASTRODUODENOSCOPY (EGD) WITH PROPOFOL N/A 04/22/2018   Procedure: ESOPHAGOGASTRODUODENOSCOPY (EGD) WITH PROPOFOL with band ligation;  Surgeon: Lin Landsman, MD;  Location: [place];  Service: Gastroenterology;  Laterality: N/A;  . ESOPHAGOGASTRODUODENOSCOPY (EGD) WITH PROPOFOL N/A 06/24/2018   Procedure: ESOPHAGOGASTRODUODENOSCOPY (EGD) WITH PROPOFOL;  Surgeon: Lin Landsman, MD;  Location: [facility] ENDOSCOPY;  Service: Gastroenterology;  Laterality: N/A;  . ESOPHAGOGASTRODUODENOSCOPY (EGD) WITH PROPOFOL N/A 11/19/2018   Procedure: ESOPHAGOGASTRODUODENOSCOPY (EGD) WITH PROPOFOL;  Surgeon: Lin Landsman, MD;  Location: [facility] ENDOSCOPY;  Service: Gastroenterology;  Laterality: N/A;  . ESOPHAGOGASTRODUODENOSCOPY (EGD) WITH PROPOFOL N/A 03/08/2020   Procedure:  ESOPHAGOGASTRODUODENOSCOPY (EGD) WITH PROPOFOL;  Surgeon: Lin Landsman, MD;  Location: [facility] ENDOSCOPY;  Service: Gastroenterology;  Laterality: N/A;  . EYE SURGERY    . HEMORRHOID BANDING  03/25/2018   Procedure: HEMORRHOID BANDING;  Surgeon: Lin Landsman, MD;  Location: [facility] ENDOSCOPY;  Service: Gastroenterology;;  . IR EMBO ART  VEN HEMORR LYMPH EXTRAV  INC GUIDE ROADMAPPING  08/07/2018  . IR RADIOLOGIST EVAL & MGMT  09/03/2018  . IR RADIOLOGIST EVAL & MGMT  12/17/2018  . IR RADIOLOGIST EVAL & MGMT  06/24/2019  . IR RADIOLOGIST EVAL & MGMT  01/22/2020  . IR TIPS  08/07/2018  . RADIOLOGY WITH ANESTHESIA N/A 08/07/2018   Procedure: TIPS;  Surgeon: Corrie Mckusick, DO;  Location: [place];  Service: Anesthesiology;  Laterality: N/A;  . TONSILLECTOMY      Family History  Problem Relation Age of Onset  . CAD Father   . Heart attack Father 25  . Cancer Maternal Uncle   . Seizures Mother     Social History:  reports that she has never smoked. She has never used smokeless tobacco. She reports previous alcohol use. She reports that she does not use drugs. She retired from [employer] in 11/2017. She lives in [place]. The patient is alone today.  Allergies: No Known Allergies  Current Medications: Current Outpatient Medications  Medication Sig Dispense Refill  . cyanocobalamin (,VITAMIN B-12,) 1000 MCG/ML injection Inject 1,000 mcg into the muscle every 30 (thirty) days.     . furosemide (LASIX) 40  MG tablet Take 1 tablet (40 mg total) by mouth daily. 90 tablet 2  . hydrOXYzine (ATARAX/VISTARIL) 10 MG tablet TAKE 1 TABLET (10 MG TOTAL) BY MOUTH 3 (THREE) TIMES DAILY AS NEEDED FOR ITCHING. 30 tablet 0  . lactulose (CHRONULAC) 10 GM/15ML solution TAKE 30 MLS (20 G TOTAL) BY MOUTH 2 (TWO) TIMES DAILY. 1892 mL 2  . metFORMIN (GLUCOPHAGE) 500 MG tablet Take 1,000 mg by mouth 2 (two) times daily with a meal.     . Misc. Devices KIT Moderate compression hose 20-30 MM HG 1 kit 0  . nystatin  (MYCOSTATIN/NYSTOP) powder Apply 1 g topically as needed (rash).     . ondansetron (ZOFRAN) 4 MG tablet ondansetron HCl 4 mg tablet  TAKE 1 TABLET BY MOUTH EVERY 6 HOURS AS NEEDED    . OneTouch Delica Lancets 52D MISC 2 (two) times daily.    Glory Rosebush ULTRA test strip 2 (two) times daily.    . pantoprazole (PROTONIX) 40 MG tablet Take 40 mg by mouth 2 (two) times daily.    . rifaximin (XIFAXAN) 550 MG TABS tablet TAKE 1 TABLET (550 MG TOTAL) BY MOUTH 2 (TWO) TIMES DAILY. 180 tablet 1  . spironolactone (ALDACTONE) 50 MG tablet TAKE 1 TABLET BY MOUTH EVERY DAY 90 tablet 0  . VENTOLIN HFA 108 (90 Base) MCG/ACT inhaler SMARTSIG:2 Puff(s) By Mouth Every 4 Hours PRN    . Vitamin D, Ergocalciferol, (DRISDOL) 1.25 MG (50000 UT) CAPS capsule TAKE 1 CAPSULE BY MOUTH ONE TIME PER WEEK 12 capsule 0  . fluconazole (DIFLUCAN) 150 MG tablet Take 150 mg by mouth daily.    . mupirocin ointment (BACTROBAN) 2 % mupirocin 2 % topical ointment    . potassium chloride (KLOR-CON 10) 10 MEQ tablet Take 1 tablet (10 mEq total) by mouth daily for 4 days. 4 tablet 0   No current facility-administered medications for this visit.    Review of Systems  Constitutional: Positive for malaise/fatigue. Negative for chills, diaphoresis, fever and weight loss (up 25 lbs).       Feels "tired".  HENT: Negative for congestion, ear discharge, ear pain, hearing loss, nosebleeds, sinus pain, sore throat and tinnitus.   Eyes: Negative for blurred vision.  Respiratory: Positive for shortness of breath (on exertion). Negative for cough, hemoptysis, sputum production and wheezing.   Cardiovascular: Positive for leg swelling (BLE). Negative for chest pain and palpitations.  Gastrointestinal: Negative for abdominal pain, blood in stool, constipation, diarrhea (lactulose causes loose stools), heartburn, melena, nausea and vomiting.       Portal hypertensive gastropathy s/p variceal ligation.  Eating well.  Genitourinary: Negative for  dysuria, frequency, hematuria and urgency.  Musculoskeletal: Negative for back pain, joint pain, myalgias and neck pain.  Skin: Positive for itching (back at night; lower abdominal itch gone). Negative for rash.       Telangectasias on face and back.  Neurological: Negative for dizziness, tingling, sensory change, weakness and headaches.  Endo/Heme/Allergies: Does not bruise/bleed easily.       Diabetes on Metformin.  Psychiatric/Behavioral: Negative for depression and memory loss. The patient is not nervous/anxious and does not have insomnia.   All other systems reviewed and are negative.    Performance status (ECOG): 1-2  Vitals Blood pressure (!) 130/58, pulse 66, temperature (!) 96.7 F (35.9 C), temperature source Tympanic, weight 194 lb 14.2 oz (88.4 kg).   Physical Exam  Constitutional: She is oriented to person, place, and time. She appears well-developed and well-nourished. No  distress.  HENT:  Head: Normocephalic and atraumatic.  Mouth/Throat: Oropharynx is clear and moist. No oropharyngeal exudate.  Shoulder length gray hair. Mask.  Eyes: Pupils are equal, round, and reactive to light. Conjunctivae and EOM are normal. No scleral icterus.  Glasses.  Blue eyes.  Cardiovascular: Normal rate, regular rhythm and normal heart sounds.  No murmur heard. Pulmonary/Chest: Effort normal and breath sounds normal. No respiratory distress. She has no wheezes. She has no rales. She exhibits no tenderness.  Abdominal: Soft. Bowel sounds are normal. She exhibits no distension and no mass. There is no abdominal tenderness. There is no rebound and no guarding.  Musculoskeletal:        General: Edema (BLE) present. No tenderness. Normal range of motion.     Cervical back: Normal range of motion and neck supple.  Lymphadenopathy:    She has no cervical adenopathy.    She has no axillary adenopathy.       Right: No supraclavicular adenopathy present.       Left: No supraclavicular adenopathy  present.  Neurological: She is alert and oriented to person, place, and time.  Skin: Skin is warm and dry. She is not diaphoretic.  Telangectasias face and chest. Spider angiomas.   Psychiatric: She has a normal mood and affect. Her behavior is normal. Judgment and thought content normal.  Nursing note and vitals reviewed.   No visits with results within 3 Day(s) from this visit.  Latest known visit with results is:  Admission on 03/08/2020, Discharged on 03/08/2020  Component Date Value Ref Range Status  . Glucose-Capillary 03/08/2020 60* 70 - 99 mg/dL Final   Glucose reference range applies only to samples taken after fasting for at least 8 hours.    Assessment:  Crystal [name] is a 69 y.o. female with a right lower lobe pulmonary nodule. She denies any smoking history.  Abdomen and pelvic CTon 02/09/2018 revealed an 1.8 cm spiculated density in right lower lobe concerning for malignancy. There was mildly nodular hepatic contourconcerning for hepatic cirrhosis. There was moderate splenomegalysuggesting portal venous hypertension. Mild ascites was noted. There were mildly enlarged retroperitoneal lymph nodes (largest 1.1 cm) concerning for possible metastatic disease or malignancy. There was a 7 cm left ovarian cyst. Further evaluation with MRI was recommended to evaluate for possible neoplasm. CA125was 351.3 and CEA1.1 on 02/21/2018.   Chest CTon 02/25/2018 revealed a 2.3 x 1.5 x 1.0 cm perifissural nodulewith spiculated margins in the right middle lobe. There was a 1.5 x 1.5 x 1.4 cm right lower lobe spiculated nodule. There was a moderate left sided pleural effusion.   PET scanon 03/08/2018 revealed a 2.5 x 1.8 cm sub solid nodular opacity in theRML(SUV 2.7) and a 1.6 cm roundedslightly spiculated RLL pulmonary nodule(SUV 2.1). There were no enlarged or hypermetabolic mediastinal or hilar lymph nodes. There was a small left pleural effusion. There was cirrhosis  with portal venous hypertension, portal venous collaterals, splenomegaly and upper abdominal lymphadenopathy.  Chest CTon 05/15/2018 revealed no significant changes in the 2 previously noted nodules in the RIGHT lung. Irregular RML lesion measures 2.4 x 1.4 cm (previously 2.3 x 1.5 cm). Spiculated central RLL lesion measures 1.8 x 1.4 cm (previously 1.6 x 1.5 cm). Both areas remain concerning for neoplasm. There was interval decrease in ascites volume overall. Upper abdominal lymphadenopathy noted that was felt to be reactive due to underlying liver disease.   Chest CTon 08/15/2018 revealed the spiculated right lower lobe lung lesion appeared slightly increased and  was not significantly changed in size compared with previous exam. Morphologically, this was worrisome for a small bronchogenic neoplasm. The right middle lobe lung nodule was slightly decreased in size in the interval and may be post infectious or inflammatory in etiology.  Super D chest CTon 01/24/2019 revealed a 3.2 x 2.3 cm ill-defined anterior right lung nodule compared to 2.9 x 2.3 cm previously. Minor fissurewasnot well, and as such, this nodule can not be confidently localized to the right upper or middle lobe. The right lower lobe nodule measured2.0 x 2.0 cm comparedto 2.0 x 1.9 cm.  Chest CTon 08/07/2019 revealed no significant interval change in size right middle lobe and right lower lobe lung nodules.Liverwascompatible with cirrhosiss/pTIPS.  Chest CT on 02/02/2020 revealed stable nodules within the right middle and right lower lobes.  There were cirrhotic changes of the liver with TIPS shunt in place. There was mild prominence of the ascending aorta to 4 cm. Recommended annual imaging followup by CTA or MRA.  She was [facility] to [facility] from 02/09/2018 - 04/02/2019with a variceal hemorrhage. EGDon 02/10/2018 revealed 5 columns of grade III esophageal varices in the lower third of the esophagus. There was  stigmata of recent bleeding and red wale signs. She underwent variceal ligation x 10. She required 3 units of PRBCs.  EGDon 03/25/2018 revealed 2 angiectasias(non-bleeding) in the second portion of the duodenum, and a few diminutive (non-bleeding) angiectasias in the prepyloric region of the stomach that were treated APC. There were 4 non-bleeding ulcers (Forrest Class III) found in the gastric fundus. There were 4 columns of large non-bleeding varicesin the lower third of the esophagus, of which demonstrated no stigmata of bleeding. Varices were banded. EGD on 04/22/2018 revealed one non-bleeding cratered gastric ulcer(Forrest Class III) in the lesser curvature of the gastric body. Duodenal bulb and second portion of the duodenum were normal. Moderate portal hypertensive gastropathy noted in the stomach. Large varices in the lower third of the esophagus noted. 2 bands were placed with incomplete eradication of the lesions. EGD on 03/08/2020 revealed a few non-bleeding angioectasias in the duodenum and a gastric antral vascular ectasia with bleeding treated with argon plasma coagulation (APC).  There was a single non-bleeding angioectasia in the stomach treated with bipolar cautery. Gastroesophageal junction and esophagus were normal. No specimens were collected.  Colonoscopyon 03/25/2018 revealed a single 5 mm sessile polyp in the ascending colon. Patient has rectal varices and external hemorrhoids. Pathology returned as tubular adenoma, and was negative for high grade dysplasia and malignancy.   She has iron deficiency anemia. Ferritinwas 6. She is on ferrous sulfate 325 mg TID. She hasB12 deficiency. B12was 103. Intrinsic factor antibody was negative. 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 She began B12 injections on 03/06/2018 (last04/20/2021). Folate was12.3on12/14/2020. B12 was 485 on 05/24/2018. Dietis good.  She received  Venoferweekly x 4 (07/12/2018 - 07/31/2018) and x 3 (12/22/2019 - 01/05/2020).   Ferritinhas been followed: 6 on 02/09/2018, 16 on 05/24/2018, 13 on 07/10/2018, 374 on 08/15/2018, 485 on 08/21/2018, 114 on 09/25/2018, 63 on 11/20/2018, 35 on 02/12/2019, 22 on 04/24/2019, 41 on 09/22/2019, 41 on 10/27/2019, 21 on 12/15/2019, 22 on 03/22/2020, and 20 on 03/26/2020.  She has hepatitis C antibodies. HCV RNA was not detected. She has been exposed to hepatitis B. Anti-smooth muscle antibodiesare weakly positive. ANAwas positive (>1:1280 centromere antibody). Negative studiesincluded: ceruloplasmin, anti-mitochondrial antibodies, hepatitis B IgM, HIV, and alpha-1 antitrypsin. PT was 15.2 (INR 1.21). AFPwas 1.5 on 05/28/2018,2.4 on 11/20/2018, and  2.0 on 04/24/2019.  She has a history of decompensated cirrhosiss/p TIPSprocedure 08/07/2018. She has portal hypertension and a history of recurrent bleeding esophageal varices.She was given a 2-year life expectancyfollowing her TIPS procedure. Abdominal/pelvic arterial/venous ultrasound doppleron 04/24/2019 revealed a patent TIPS and no evidence of persisting ascites.  Abdominal MRI including MRCPon 11/12/2019 revealed cirrhosis with TIPS in place.There was no biliary duct dilatation.There was cholelithiasis without acute cholecystitis. Right hepatic lobe 9 mm lesion whichwas indeterminate, LR 3.This was possibly present on the prior CT of 2019. Recommendation includedrepeat pre and post contrast abdominal MRI at 3-6 months. There as small volume abdominal ascites.The right lower lobe pulmonary nodulewas suboptimally evaluated.  [facility] with[facility].HeadCT revealedno acute intracranial pathology. Head MRIwo contrast showed symmetric FLAIR hyperintensity within the bilateral mamillary bodies. This finding may be seen in the setting of Wernicke encephalopathy.There was  no evidence of acute infarct and mild chronic small vessels with ischemic disease.Her mental status returned to baseline.  RUQ ultrasoundon 05/29/2018 revealed cholelithiasis. There was a thickened edematous gallbladder wall. The wall thickening could be due to hypoproteinemia/hypoalbuminemia or cholecystitis. Negative sonographic Murphy's sign. There was a heterogeneous slightly nodular liver consistent with cirrhosis.  Pelvic ultrasoundon 03/18/2018 demonstrated a simple cyst in the LEFT ovarymeasuring 6.1 x 4.5 x 6.6 cm. Repeat imaging recommended in 1 year to document stability.  Patient received her COVID-19 vaccines (02/12/20201, 01/21/2020).   Symptomatically, she feels tired.  She has issues with fluid gain.  Plan: 1.   Review interval [facility] labs. 2.Decompensated cirrhosis s/p TIPS Clinically,she is doing fair. Life expectancy is limited s/pTIPS (2 years). She denies any excess bruising or bleeding. AFPwas 2.0on06/09/2019. She is followed by gastroenterology. 3.Pulmonary nodules Chest CT on 09/24/2020revealed no significant interval change in sizeRMLand RLLlung nodules. Chest CT on 02/02/2020 was personally reviewed.  Agree with radiology interpretation.   There were stable nodules within the right middle and right lower lobes.     There were cirrhotic changes of the liver with TIPS shunt in place. Patient in agreement with follow-up imaging.  4.Iron deficiency anemia Hematocrit 27.3. Hemoglobin9.3. MCV84on 03/26/2020. Ferritin20 on 03/26/2020. IV iron if ferritin < 30.  Venofer today and weekly x 2 (total 3). Continue to monitor. 5.B12 deficiency Patient receives B12 monthly (last04/20/2021) B12 today and monthly x6. 6.Left ovarian cyst Pelvic ultrasoundon05/04/2018 revealed a simple cyst in the left ovary (6.1 x 4.5 x 6.6 cm). Patient  declines follow-up. 7.   Bilateral lower extremity edema   Etiology likely secondary to anemia and hypoalbuminemia.   Bilateral lower extremity duplex to r/o DVT today.  8.   Patient traveling to [place] end of this week. Please provide business card and adjust 2nd and 3rd Venofer dates). 9.   RTC in 6 weeks for [facility] labs (CBC with diff, ferritin). 10   RTC in 3 months for MD assessment, [facility] labs (CBC with diff, ferritin- day before), and +/- Venofer.   I discussed the assessment and treatment plan with the patient.  The patient was provided an opportunity to ask questions and all were answered.  The patient agreed with the plan and demonstrated an understanding of the instructions.  The patient was advised to call back if the symptoms worsen or if the condition fails to improve as anticipated.   Lequita Asal, MD, PhD    03/30/2020, 10:57 AM  I, Selena Batten, am acting as scribe for [employer]. Mike Gip, MD, PhD.  I,  C. Mike Gip, MD, have reviewed  the above documentation for accuracy and completeness, and I agree with the above.

## 2020-03-30 ENCOUNTER — Inpatient Hospital Stay: Payer: Medicare HMO | Attending: Hematology and Oncology | Admitting: Hematology and Oncology

## 2020-03-30 ENCOUNTER — Ambulatory Visit
Admission: RE | Admit: 2020-03-30 | Discharge: 2020-03-30 | Disposition: A | Discharge status: Home / Self Care | Payer: Medicare HMO | Source: Ambulatory Visit | Attending: Hematology and Oncology | Admitting: Hematology and Oncology

## 2020-03-30 ENCOUNTER — Inpatient Hospital Stay: Payer: Medicare HMO

## 2020-03-30 ENCOUNTER — Encounter: Payer: Self-pay | Admitting: Hematology and Oncology

## 2020-03-30 VITALS — BP 132/66 | HR 63 | Resp 18

## 2020-03-30 VITALS — BP 130/58 | HR 66 | Temp 96.7°F | Wt 194.9 lb

## 2020-03-30 DIAGNOSIS — D631 Anemia in chronic kidney disease: Secondary | ICD-10-CM | POA: Diagnosis present

## 2020-03-30 DIAGNOSIS — K31819 Angiodysplasia of stomach and duodenum without bleeding: Secondary | ICD-10-CM

## 2020-03-30 DIAGNOSIS — N1832 Chronic kidney disease, stage 3b: Secondary | ICD-10-CM | POA: Insufficient documentation

## 2020-03-30 DIAGNOSIS — R600 Localized edema: Secondary | ICD-10-CM | POA: Diagnosis not present

## 2020-03-30 DIAGNOSIS — E611 Iron deficiency: Secondary | ICD-10-CM | POA: Diagnosis not present

## 2020-03-30 DIAGNOSIS — R918 Other nonspecific abnormal finding of lung field: Secondary | ICD-10-CM | POA: Diagnosis not present

## 2020-03-30 DIAGNOSIS — D500 Iron deficiency anemia secondary to blood loss (chronic): Secondary | ICD-10-CM

## 2020-03-30 DIAGNOSIS — E538 Deficiency of other specified B group vitamins: Secondary | ICD-10-CM | POA: Diagnosis not present

## 2020-03-30 DIAGNOSIS — I82412 Acute embolism and thrombosis of left femoral vein: Secondary | ICD-10-CM | POA: Diagnosis not present

## 2020-03-30 DIAGNOSIS — Z95828 Presence of other vascular implants and grafts: Secondary | ICD-10-CM

## 2020-03-30 MED ORDER — SODIUM CHLORIDE 0.9 % IV SOLN
INTRAVENOUS | Status: DC
  Filled 2020-03-30: qty 250

## 2020-03-30 MED ORDER — IRON SUCROSE 20 MG/ML IV SOLN
200.0000 mg | Freq: Once | INTRAVENOUS | Status: AC
  Administered 2020-03-30: 200 mg via INTRAVENOUS
  Filled 2020-03-30: qty 10

## 2020-03-30 MED ORDER — CYANOCOBALAMIN 1000 MCG/ML IJ SOLN
1000.0000 ug | Freq: Once | INTRAMUSCULAR | Status: AC
  Administered 2020-03-30: 1000 ug via INTRAMUSCULAR
  Filled 2020-03-30: qty 1

## 2020-03-31 ENCOUNTER — Encounter: Payer: Self-pay | Admitting: *Deleted

## 2020-04-01 ENCOUNTER — Encounter: Payer: Self-pay | Admitting: Hematology and Oncology

## 2020-04-03 ENCOUNTER — Other Ambulatory Visit: Payer: Self-pay | Admitting: Gastroenterology

## 2020-04-03 DIAGNOSIS — K7290 Hepatic failure, unspecified without coma: Secondary | ICD-10-CM

## 2020-04-05 NOTE — Telephone Encounter (Signed)
Last office visit 12/17/2019  Last refill 01/05/2020 2 refill  Has appointment 05/03/2020

## 2020-04-06 ENCOUNTER — Other Ambulatory Visit: Payer: Self-pay

## 2020-04-06 ENCOUNTER — Inpatient Hospital Stay: Payer: Medicare HMO

## 2020-04-06 DIAGNOSIS — D500 Iron deficiency anemia secondary to blood loss (chronic): Secondary | ICD-10-CM | POA: Diagnosis not present

## 2020-04-06 DIAGNOSIS — E538 Deficiency of other specified B group vitamins: Secondary | ICD-10-CM | POA: Diagnosis not present

## 2020-04-06 DIAGNOSIS — N1832 Chronic kidney disease, stage 3b: Secondary | ICD-10-CM | POA: Diagnosis not present

## 2020-04-06 MED ORDER — IRON SUCROSE 20 MG/ML IV SOLN
200.0000 mg | Freq: Once | INTRAVENOUS | Status: AC
  Administered 2020-04-06: 200 mg via INTRAVENOUS

## 2020-04-06 MED ORDER — SODIUM CHLORIDE 0.9 % IV SOLN
INTRAVENOUS | Status: DC
  Filled 2020-04-06: qty 250

## 2020-04-07 NOTE — Progress Notes (Signed)
04/06/20 pt here for venofer infusion, IV attempt times two, pt c/o burning upon infusion, IV d/ced at pt request, she request to come back at her scheduled next appt.

## 2020-04-09 DIAGNOSIS — R69 Illness, unspecified: Secondary | ICD-10-CM | POA: Diagnosis not present

## 2020-04-13 ENCOUNTER — Inpatient Hospital Stay: Payer: Medicare HMO | Attending: Hematology and Oncology

## 2020-04-13 ENCOUNTER — Other Ambulatory Visit: Payer: Self-pay

## 2020-04-13 VITALS — BP 118/69 | HR 74 | Temp 99.0°F | Resp 18

## 2020-04-13 DIAGNOSIS — D500 Iron deficiency anemia secondary to blood loss (chronic): Secondary | ICD-10-CM | POA: Diagnosis not present

## 2020-04-13 DIAGNOSIS — E611 Iron deficiency: Secondary | ICD-10-CM | POA: Insufficient documentation

## 2020-04-13 DIAGNOSIS — D631 Anemia in chronic kidney disease: Secondary | ICD-10-CM | POA: Diagnosis present

## 2020-04-13 DIAGNOSIS — E538 Deficiency of other specified B group vitamins: Secondary | ICD-10-CM

## 2020-04-13 DIAGNOSIS — N1832 Chronic kidney disease, stage 3b: Secondary | ICD-10-CM | POA: Insufficient documentation

## 2020-04-13 MED ORDER — SODIUM CHLORIDE 0.9 % IV SOLN
Freq: Once | INTRAVENOUS | Status: AC
  Filled 2020-04-13: qty 250

## 2020-04-13 MED ORDER — IRON SUCROSE 20 MG/ML IV SOLN
200.0000 mg | Freq: Once | INTRAVENOUS | Status: AC
  Administered 2020-04-13: 200 mg via INTRAVENOUS
  Filled 2020-04-13: qty 10

## 2020-04-14 ENCOUNTER — Encounter: Payer: Self-pay | Admitting: Family

## 2020-04-14 ENCOUNTER — Ambulatory Visit (INDEPENDENT_AMBULATORY_CARE_PROVIDER_SITE_OTHER): Payer: Medicare HMO | Admitting: Family

## 2020-04-14 VITALS — BP 120/58 | HR 63 | Ht 62.0 in | Wt 191.2 lb

## 2020-04-14 DIAGNOSIS — E538 Deficiency of other specified B group vitamins: Secondary | ICD-10-CM | POA: Diagnosis not present

## 2020-04-14 DIAGNOSIS — I5031 Acute diastolic (congestive) heart failure: Secondary | ICD-10-CM

## 2020-04-14 DIAGNOSIS — I2510 Atherosclerotic heart disease of native coronary artery without angina pectoris: Secondary | ICD-10-CM

## 2020-04-14 DIAGNOSIS — K7460 Unspecified cirrhosis of liver: Secondary | ICD-10-CM

## 2020-04-14 DIAGNOSIS — D500 Iron deficiency anemia secondary to blood loss (chronic): Secondary | ICD-10-CM | POA: Diagnosis not present

## 2020-04-14 DIAGNOSIS — K31819 Angiodysplasia of stomach and duodenum without bleeding: Secondary | ICD-10-CM | POA: Diagnosis not present

## 2020-04-14 DIAGNOSIS — R0602 Shortness of breath: Secondary | ICD-10-CM | POA: Diagnosis not present

## 2020-04-14 IMAGING — CT CT CHEST W/ CM
2 of 4 series · 15 of 36 positions shown, 18 images · IV contrast (omnipaque)
Comparison: CT AP 02/09/2018.

CLINICAL DATA: Evaluate pulmonary nodule.

EXAM:
CT CHEST WITH CONTRAST
TECHNIQUE: Multidetector CT imaging of the chest was performed during
intravenous contrast administration.
CONTRAST:  75mL OMNIPAQUE IOHEXOL 300 MG/ML  SOLN

[Series 2: axial chest · axial · 0.68mm/px · z∈[-1203,-953]mm · 12 of 149 slices shown, 15 images]
[im 12/149  mediastinal]
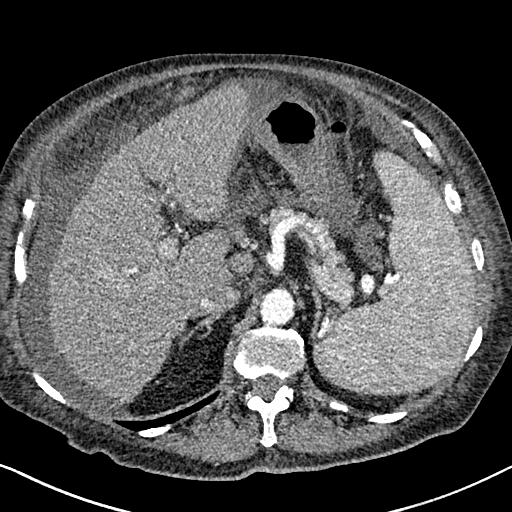
[im 12/149  lung]
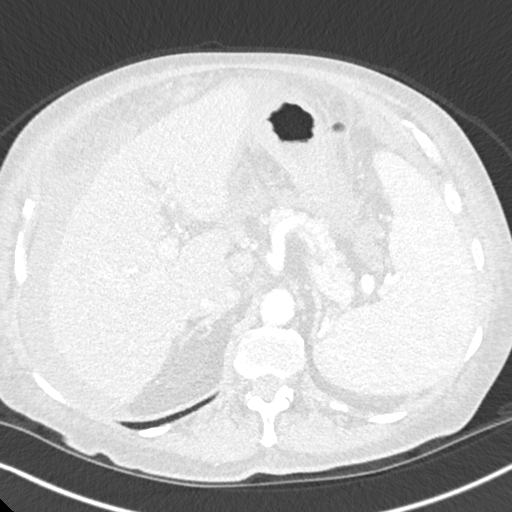
[im 23/149  lung]
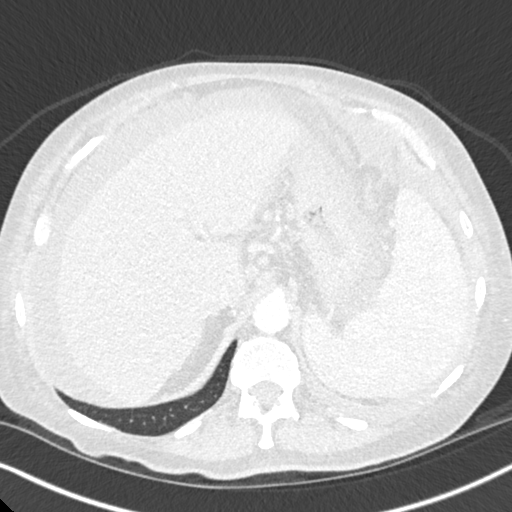
[im 35/149  lung]
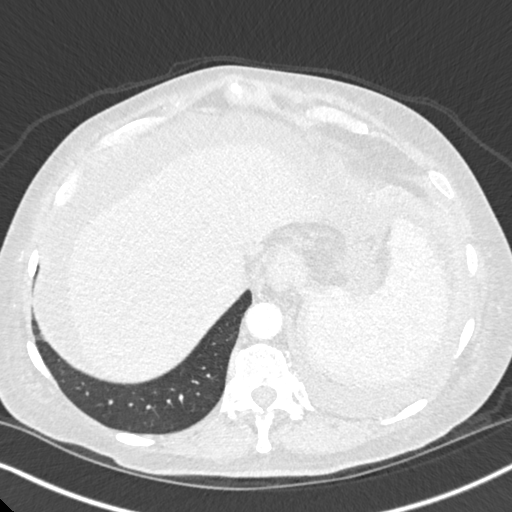
[im 46/149  lung]
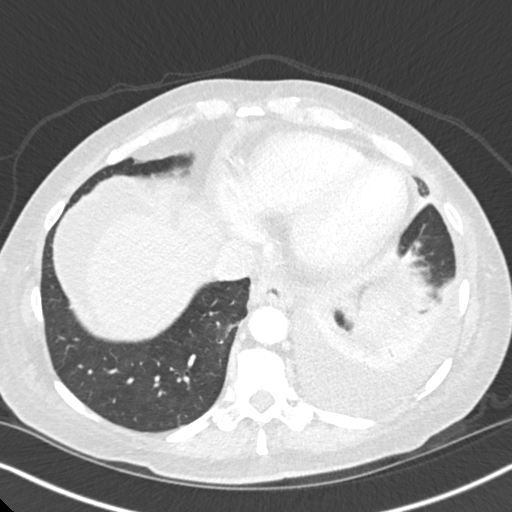
[im 57/149  mediastinal]
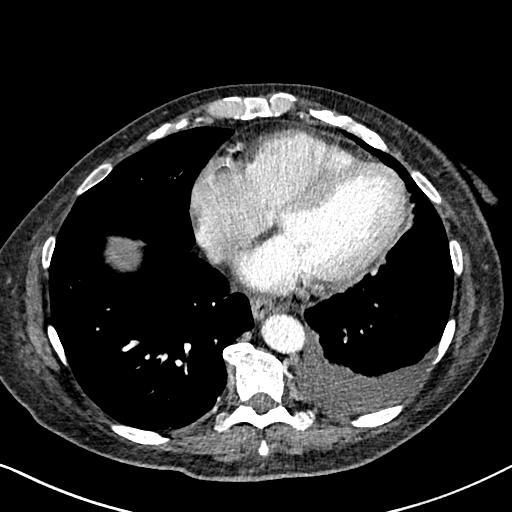
[im 57/149  lung]
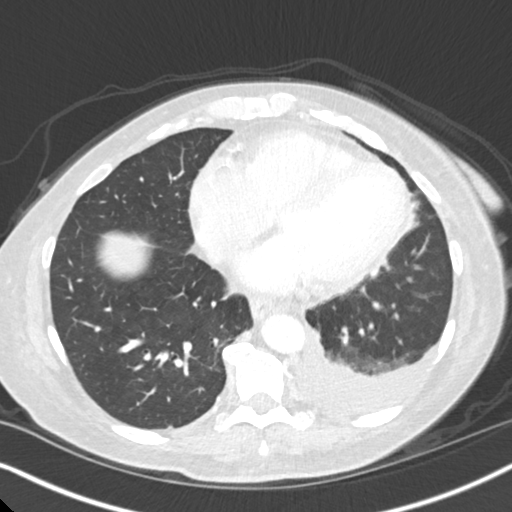
[im 69/149  lung]
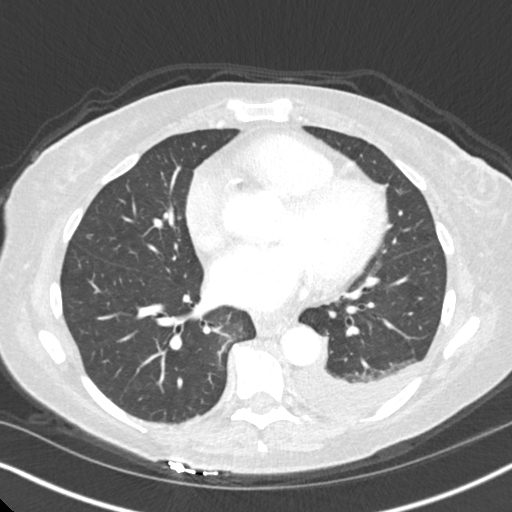
[im 80/149  lung]
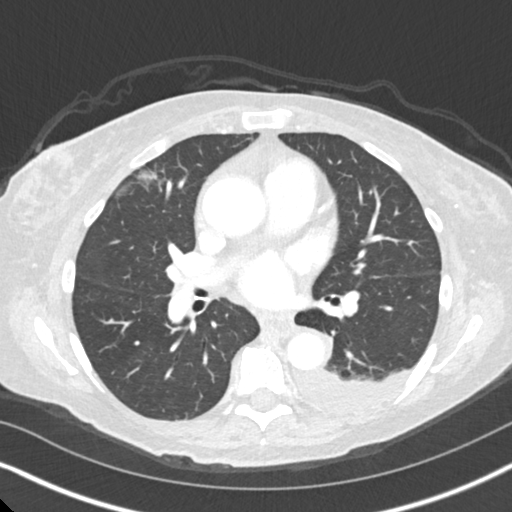
[im 92/149  lung]
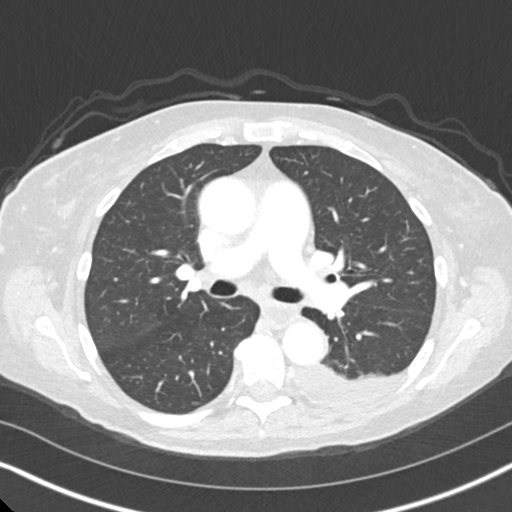
[im 103/149  mediastinal]
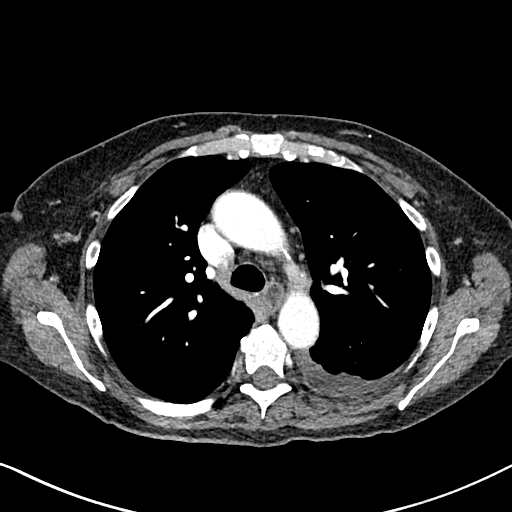
[im 103/149  lung]
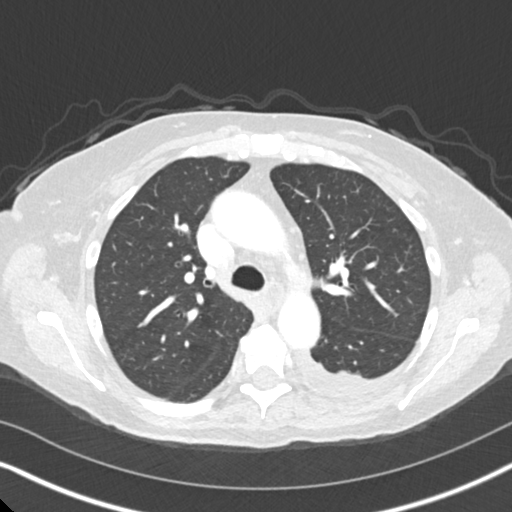
[im 114/149  lung]
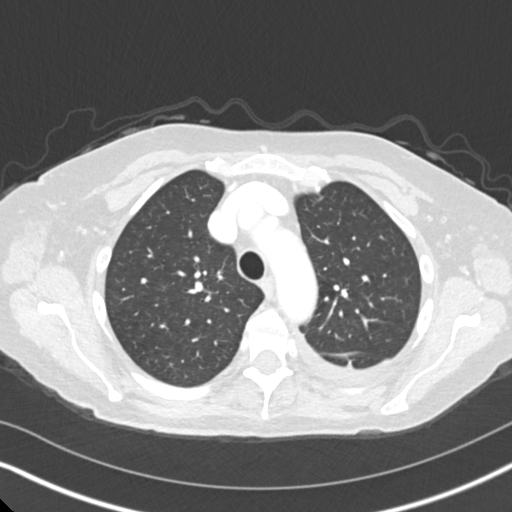
[im 126/149  lung]
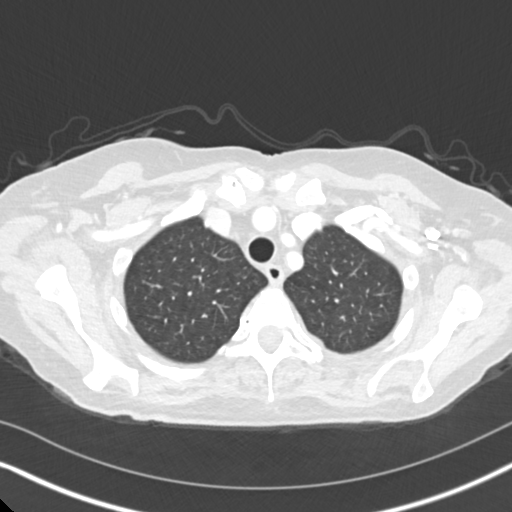
[im 137/149  lung]
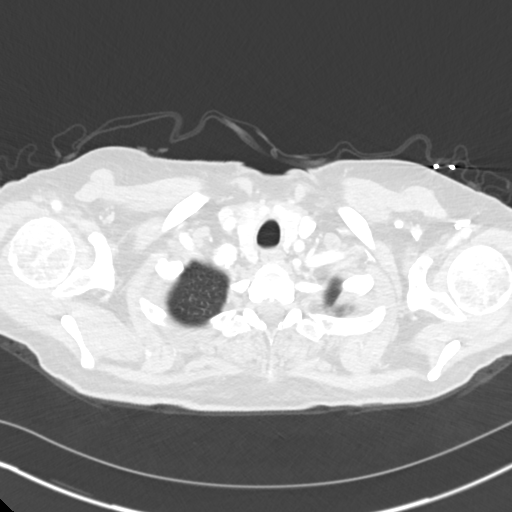

[Series 4: coronal chest · coronal · 0.59mm/px · 3 of 136 slices shown]
[im 28/136  lung]
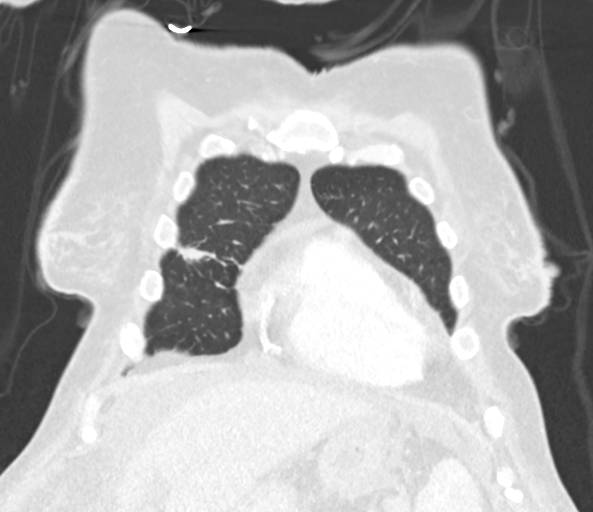
[im 55/136  lung]
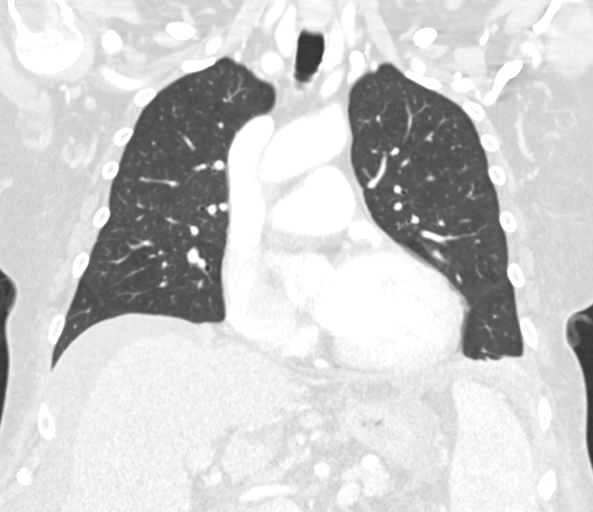
[im 82/136  lung]
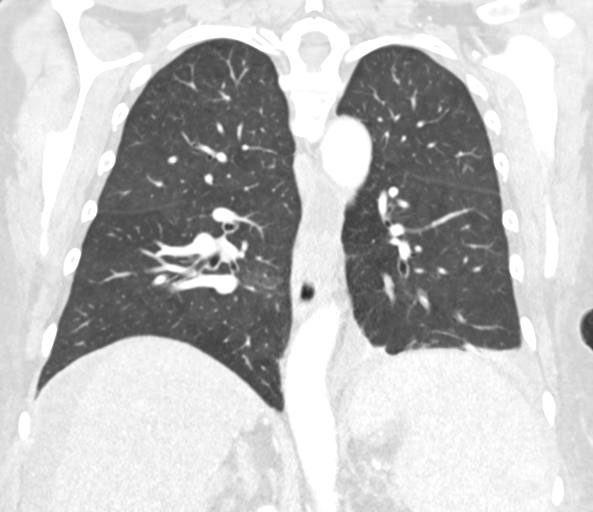

[15 of 36 positions shown; findings below may reference images not displayed]

FINDINGS: Cardiovascular: The heart size appears normal. No pericardial
effusion. Aortic atherosclerosis. Calcification within the LAD, left
circumflex and RCA coronary artery noted. Increase caliber of the
main pulmonary artery measures 3.8 cm.

Mediastinum/Nodes: Normal appearance of the thyroid gland. The
trachea appears patent and is midline. Normal appearance of the
esophagus.

Lungs/Pleura: There is a moderate left pleural effusion, increased
in volume from previous exam. Perifissural nodule with spiculated
margins in the right middle lobe measures 2.3 by 1.5 by 1.0 cm. This
area was not imaged on the previous exam. Within the right lower
lobe there is a nodule with spiculated margins measuring 1.5 by
by 1.4 cm. Similar to the previous exam.

Upper Abdomen: The liver appears cirrhotic. There is a moderate to
large volume of ascites within the upper abdomen. Partially
visualized enlarged spleen is again noted. Enlarged upper abdominal
lymph nodes are again identified measuring up to 1.8 cm short axis.

Musculoskeletal: No aggressive lytic or sclerotic bone lesions.
IMPRESSION: 1. Spiculated nodule within the right lower lobe is again identified
and is worrisome for small pulmonary neoplasm. Further investigation
with PET-CT is advised. Non-contrast chest CT at 3-6 months is
recommended. If the nodules are stable at time of repeat CT, then
future CT at 18-24 months (from today's scan) is considered optional
for low-risk patients, but is recommended for high-risk patients.
This recommendation follows the consensus statement: Guidelines for
Management of Incidental Pulmonary Nodules Detected on CT
Images:From the [HOSPITAL] 8719; published online before
print (10.1148/[id number]).
2. A second perifissural nodular density with spiculated margins is
identified along the minor fissure. Attention in this nodule on
PET-CT is recommended.
3. Moderate left pleural effusion is slightly increased in volume
from the prior exam.
4. Increase caliber of the main pulmonary artery suggests PA
hypertension.
5. Aortic Atherosclerosis ([id number]). Three vessel coronary
artery calcifications noted.
6. Suspect cirrhosis with stigmata of portal venous hypertension
including ascites and splenomegaly.

## 2020-04-14 MED ORDER — POTASSIUM CHLORIDE CRYS ER 10 MEQ PO TBCR: 10.0000 meq | EXTENDED_RELEASE_TABLET | Freq: Every day | ORAL | 3 refills | Status: DC

## 2020-04-14 MED ORDER — FUROSEMIDE 40 MG PO TABS: 40.0000 mg | ORAL_TABLET | Freq: Every day | ORAL | 2 refills | Status: DC

## 2020-04-14 NOTE — Progress Notes (Signed)
Office Visit    Patient Name: Crystal [name] Date of Encounter: 04/14/2020  Primary Care Provider:  Lorelee Market, MD Primary Cardiologist:  Nelva Bush, MD Electrophysiologist:  None   Chief Complaint    Crystal [name] is a 69 y.o. female with a hx of chronic diastolic heart failure, cirrhosis complicated by esophageal varices requiring endoscopic treatment and TI PS, PUD, DM2, lung nodules with nondiagnostic biopsy 01/2019, B12 deficiency, iron deficiency anemia presents today for follow-up of diastolic heart failure  Past Medical History    Past Medical History:  Diagnosis Date  . Acute GI bleeding 02/09/2018  . Acute upper gastrointestinal bleeding 08/03/2018  . Anemia    TAKES IRON TAB  . Arthritis    SHOULDER  . B12 deficiency 02/18/2018  . Bleeding    GI  3/19  . Bronchitis    HX OF  . Cirrhosis ([place])   . Colon cancer screening   . Diabetes mellitus without complication ([place])    TYPE 2  . Fatty (change of) liver, not elsewhere classified   . Full dentures    UPPER AND LOWER  . Iron deficiency anemia due to chronic blood loss 02/18/2018  . Lung nodule, multiple   . Pernicious anemia 02/22/2018  . PUD (peptic ulcer disease)   . Raynaud's syndrome   . Upper GI bleeding 08/03/2018   Past Surgical History:  Procedure Laterality Date  . CATARACT EXTRACTION W/PHACO Right 02/06/2018   Procedure: CATARACT EXTRACTION PHACO AND INTRAOCULAR LENS PLACEMENT ([place]) RIGHT DIABETIC;  Surgeon: Leandrew Koyanagi, MD;  Location: [place];  Service: Ophthalmology;  Laterality: Right;  DIABETIC, ORAL MED  . CATARACT EXTRACTION W/PHACO Left 04/30/2018   Procedure: CATARACT EXTRACTION PHACO AND INTRAOCULAR LENS PLACEMENT ([facility]);  Surgeon: Leandrew Koyanagi, MD;  Location: [facility];  Service: Ophthalmology;  Laterality: Left;  [place] 01:04 AP% 21.3 CDE 13.66 Fluid pack lot # [id number]  . COLONOSCOPY WITH PROPOFOL N/A 03/25/2018   Procedure: COLONOSCOPY WITH PROPOFOL;   Surgeon: Lin Landsman, MD;  Location: [facility] ENDOSCOPY;  Service: Gastroenterology;  Laterality: N/A;  . DENTAL SURGERY     EXTRACTIONS  . ELECTROMAGNETIC NAVIGATION BROCHOSCOPY Right 01/27/2019   Procedure: ELECTROMAGNETIC NAVIGATION BRONCHOSCOPY;  Surgeon: [name], MD;  Location: [facility];  Service: Cardiopulmonary;  Laterality: Right;  . ESOPHAGOGASTRODUODENOSCOPY N/A 08/03/2018   Procedure: ESOPHAGOGASTRODUODENOSCOPY (EGD);  Surgeon: Lin Landsman, MD;  Location: [facility] ENDOSCOPY;  Service: Gastroenterology;  Laterality: N/A;  . ESOPHAGOGASTRODUODENOSCOPY (EGD) WITH PROPOFOL N/A 02/10/2018   Procedure: ESOPHAGOGASTRODUODENOSCOPY (EGD) WITH PROPOFOL;  Surgeon: Jonathon Bellows, MD;  Location: [facility] ENDOSCOPY;  Service: Gastroenterology;  Laterality: N/A;  . ESOPHAGOGASTRODUODENOSCOPY (EGD) WITH PROPOFOL N/A 03/25/2018   Procedure: ESOPHAGOGASTRODUODENOSCOPY (EGD) WITH PROPOFOL;  Surgeon: Lin Landsman, MD;  Location: [facility] ENDOSCOPY;  Service: Gastroenterology;  Laterality: N/A;  . ESOPHAGOGASTRODUODENOSCOPY (EGD) WITH PROPOFOL N/A 04/22/2018   Procedure: ESOPHAGOGASTRODUODENOSCOPY (EGD) WITH PROPOFOL with band ligation;  Surgeon: Lin Landsman, MD;  Location: [place];  Service: Gastroenterology;  Laterality: N/A;  . ESOPHAGOGASTRODUODENOSCOPY (EGD) WITH PROPOFOL N/A 06/24/2018   Procedure: ESOPHAGOGASTRODUODENOSCOPY (EGD) WITH PROPOFOL;  Surgeon: Lin Landsman, MD;  Location: [facility] ENDOSCOPY;  Service: Gastroenterology;  Laterality: N/A;  . ESOPHAGOGASTRODUODENOSCOPY (EGD) WITH PROPOFOL N/A 11/19/2018   Procedure: ESOPHAGOGASTRODUODENOSCOPY (EGD) WITH PROPOFOL;  Surgeon: Lin Landsman, MD;  Location: [facility] ENDOSCOPY;  Service: Gastroenterology;  Laterality: N/A;  . ESOPHAGOGASTRODUODENOSCOPY (EGD) WITH PROPOFOL N/A 03/08/2020   Procedure: ESOPHAGOGASTRODUODENOSCOPY (EGD) WITH PROPOFOL;  Surgeon: Lin Landsman, MD;  Location: [facility] ENDOSCOPY;  Service:  Gastroenterology;  Laterality: N/A;  . EYE SURGERY    . GIVENS CAPSULE STUDY N/A 03/29/2020   Procedure: GIVENS CAPSULE STUDY;  Surgeon: Lin Landsman, MD;  Location: [facility] ENDOSCOPY;  Service: Gastroenterology;  Laterality: N/A;  . HEMORRHOID BANDING  03/25/2018   Procedure: HEMORRHOID BANDING;  Surgeon: Lin Landsman, MD;  Location: [facility] ENDOSCOPY;  Service: Gastroenterology;;  . IR EMBO ART  VEN HEMORR LYMPH EXTRAV  INC GUIDE ROADMAPPING  08/07/2018  . IR RADIOLOGIST EVAL & MGMT  09/03/2018  . IR RADIOLOGIST EVAL & MGMT  12/17/2018  . IR RADIOLOGIST EVAL & MGMT  06/24/2019  . IR RADIOLOGIST EVAL & MGMT  01/22/2020  . IR TIPS  08/07/2018  . RADIOLOGY WITH ANESTHESIA N/A 08/07/2018   Procedure: TIPS;  Surgeon: Corrie Mckusick, DO;  Location: [place];  Service: Anesthesiology;  Laterality: N/A;  . TONSILLECTOMY      Allergies  No Known Allergies  History of Present Illness    Crystal [name] is a 69 y.o. female with a hx of chronic diastolic heart failure, cirrhosis complicated by esophageal varices requiring endoscopic treatment and TI PS, PUD, DM2, lung nodules with nondiagnostic biopsy 01/2019, B12 deficiency, iron deficiency anemia last seen 12/31/2019 by Dr. Saunders Revel.  She has had coronary artery calcification and aortic atherosclerosis incidentally noted on prior CTs.  No symptoms suggestive of CAD.  Echo 04/04/2019 LVEF 55 to 60%, mild LVH, LV diastolic parameters consistent with impaired relaxation, RV normal size and function, no significant valvular abnormalities, mild dilation of aortic root 3.5 cm.  At last clinic visit she was noted to have stable DOE and mild leg swelling well controlled with extra dose of furosemide as needed.  Fatigue had improved with iron supplementation.  Noted to have a 6 systolic murmur on exam.  She was recommended to increase her Lasix to 60 mg daily until weight returns to baseline and then return to 40 mg daily-she was 6 pounds up.  Underwent EGD 03/08/2020  with GI and capsule study 03/29/2020.  Follows with oncology for B12 deficiency and iron deficiency anemia. Had lower extremity duplex 03/30/20 ordered by oncology due to LE edema with no evidence of acute DVT bilaterally. Noted chronic DVT, calcified, at the junction of distal common femoral vein and origin of the femoral vein with diminished flow focally at this site.   Has been swollen more than usual the last two weeks. Tells me the day before yesterday she was swollen in her arms. Notices abdominal bloating.   Tells me she can't do as much as she used to without getting worn out.   02/03/20 weighed 169 lb --> 04/14/20 weighted 191 lbs. Tells me her "baseline" weight is 160-170lbs.   3 water bottles per day. Eats low sodium diet. Compliant with her Lasix 63m daily. She took an extra tablet one day last week, but wanted to wait for follow up to further adjust.   Recent renal function:  03/11/20: creatinine 1.27, GFR 44, K 4.0 01/27/20: creatinine 1.53, GFR 35, K 3.9  EKGs/Labs/Other Studies Reviewed:   The following studies were reviewed today:  LE duplex 03/30/20 IMPRESSION: 1. No evidence of deep venous thrombosis in either lower extremity in the right lower extremity.   2. Apparent chronic deep venous thrombosis, calcified, at the junction of the distal common femoral vein and origin of the femoral vein with diminished flow focally at this site. No acute appearing deep venous thrombosis seen on the  left. No other site of chronic  Echo 03/2019 1. The left ventricle has normal systolic function, with an ejection  fraction of 55-60%. The cavity size was normal. There is mildly increased  left ventricular wall thickness. Left ventricular diastolic Doppler  parameters are consistent with impaired  relaxation.   2. The right ventricle has normal systolic function. The cavity was  normal. There is no increase in right ventricular wall thickness. Unable  to estimate RVSP   3. Left atrial  size was mildly dilated.   4. There is dilatation of the aortic root 3.5 cm   EKG:  EKG is ordered today.  The ekg ordered today demonstrates NSRS 63 bpm with low voltage QRS and no acute ST/T wave changes.   Recent Labs: 08/21/2019: Magnesium 1.8 01/27/2020: ALT 20; BUN 17; Creatinine, Ser 1.53; Hemoglobin 10.7; Platelets 149; Potassium 3.9; Sodium 141  Recent Lipid Panel    Component Value Date/Time   TRIG 163 (H) 08/03/2018 2152    Home Medications   Current Meds  Medication Sig  . cyanocobalamin (,VITAMIN B-12,) 1000 MCG/ML injection Inject 1,000 mcg into the muscle every 30 (thirty) days.   . furosemide (LASIX) 40 MG tablet Take 1 tablet (40 mg total) by mouth daily.  . hydrOXYzine (ATARAX/VISTARIL) 10 MG tablet TAKE 1 TABLET (10 MG TOTAL) BY MOUTH 3 (THREE) TIMES DAILY AS NEEDED FOR ITCHING.  . lactulose (CHRONULAC) 10 GM/15ML solution Take 45 mLs (30 g total) by mouth 2 (two) times daily.  . metFORMIN (GLUCOPHAGE) 500 MG tablet Take 1,000 mg by mouth 2 (two) times daily with a meal.   . Misc. Devices KIT Moderate compression hose 20-30 MM HG  . nystatin (MYCOSTATIN/NYSTOP) powder Apply 1 g topically as needed (rash).   . ondansetron (ZOFRAN) 4 MG tablet ondansetron HCl 4 mg tablet  TAKE 1 TABLET BY MOUTH EVERY 6 HOURS AS NEEDED  . OneTouch Delica Lancets 95K MISC 2 (two) times daily.  Glory Rosebush ULTRA test strip 2 (two) times daily.  . pantoprazole (PROTONIX) 40 MG tablet Take 40 mg by mouth 2 (two) times daily.  . potassium chloride (KLOR-CON) 10 MEQ tablet Take 1 tablet (10 mEq total) by mouth daily.  . rifaximin (XIFAXAN) 550 MG TABS tablet TAKE 1 TABLET (550 MG TOTAL) BY MOUTH 2 (TWO) TIMES DAILY.  Marland Kitchen spironolactone (ALDACTONE) 50 MG tablet TAKE 1 TABLET BY MOUTH EVERY DAY  . [DISCONTINUED] furosemide (LASIX) 40 MG tablet Take 1 tablet (40 mg total) by mouth daily.  . [DISCONTINUED] potassium chloride (KLOR-CON) 10 MEQ tablet Take 10 mEq by mouth daily.      Review of  Systems    Review of Systems  Constitution: Negative for chills, fever and malaise/fatigue.  Cardiovascular: Positive for dyspnea on exertion and leg swelling. Negative for chest pain, near-syncope, orthopnea, palpitations and syncope.  Respiratory: Positive for shortness of breath. Negative for cough and wheezing.   Gastrointestinal: Positive for bloating. Negative for nausea and vomiting.  Neurological: Negative for dizziness, light-headedness and weakness.   All other systems reviewed and are otherwise negative except as noted above.  Physical Exam    VS:  BP (!) 120/58 (BP Location: Left Arm, Patient Position: Sitting, Cuff Size: Normal)   Pulse 63   Ht 5' 2" (1.575 m)   Wt 191 lb 4 oz (86.8 kg)   LMP  (LMP Unknown)   BMI 34.98 kg/m  , BMI Body mass index is 34.98 kg/m. GEN: Well nourished, well developed, in no acute  distress. HEENT: normal. Neck: Supple, no JVD, carotid bruits, or masses. Cardiac: RRR, no murmurs, rubs, or gallops. No clubbing, cyanosis. Bilateral LE 1+ pitting edema. No upper extremity edema noted.  Radials/DP/PT 2+ and equal bilaterally.  Respiratory:  Respirations regular and unlabored, clear to auscultation bilaterally. GI: Soft, nontender, nondistended MS: No deformity or atrophy. Skin: Warm and dry, no rash. Neuro:  Strength and sensation are intact. Psych: Normal affect.   Assessment & Plan    1. Chronic diastolic heart failure - Echo 03/2019 LVEF 55-60%, mild LVH, diastolic parameters consistent with impaired relaxation, RV normal size and function, mild dilation aortic root 3.5cm. Weight up 20lb over approximately 6 weeks. Reds Vest 35%. Notes DOE, LE edema, arm swelling, abdominal bloating. Endorses eating low sodium diet and PO intake of only 36oz fluid daily. Increase Lasix to 60m for 1 week and Kdur to 150m for 1 week. BNP, BMET today. Consider echocardiogram at follow up pending response to diuresis. If she does not respond to increased Lasix  dose, consider transition to Torsemide.   2. Iron deficiency anemia/B12 deficiency - Follows with Dr. CoMike GipShe is due for monitoring labs this week. As we are collecting labs, will get B12, CBC, Fe+TIBC+Fe so she does not have to get labs twice. Will forward result. Worsening anemia could be contributory to edema and dyspnea.  3. 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 Discussed with Dr. EnSaunders Reveln clinic, no indication for anticoagulation as DVT is well calcified. No indication for referral to vascular surgery. LE edema primarily diastolic heart failure, cirrhosis, venous insufficiency in etiology. Changes to diuretic, as above. Recommend elevating lower extremities when sitting, low sodium diet, compression stockings.   4. Coronary artery calcification on CT and aortic atherosclerosis - No anginal symptoms. No indication for ischemic evaluation at this time.   5. Cirrhosis -follows with GI. Likely contributory to fluid retention.   Disposition: Follow up in 1 week(s) with Dr. EnSaunders Revelr APP.    CaLoel DubonnetNP 04/14/2020, 1:08 PM

## 2020-04-14 NOTE — Patient Instructions (Addendum)
Medication Instructions:  Your physician has recommended you make the following change in your medication:   CHANGE Lasix to 72m daily (1.5 tablet) for 1 week, then resume normal dose.   CHANGE KDur to 1.5 tablet daily for 1 week, then resume normal dose.   *If you need a refill on your cardiac medications before your next appointment, please call your pharmacy*   Lab Work: Your physician recommends that you return for lab work today: BNP, BMET, B12, CBC, Fe+TIBC+Fe  If you have labs (blood work) drawn today and your tests are completely normal, you will receive your results only by: .Marland KitchenMyChart Message (if you have MyChart) OR . A paper copy in the mail If you have any lab test that is abnormal or we need to change your treatment, we will call you to review the results.   Testing/Procedures: Your lower extremity ultrasound showed a chronic DVT (clot) that was well calcified. This does not require treatment. Discussed with Dr. ESaunders Reveland he did not think you needed to see vascular surgery. Likely an inadvertent (accidental) finding. It is not causing your fluid retention and swelling.   Your EKG today showed normal sinus rhythm.   Reds vest (fluid monitor) showed 35% (normal is 25-35%)   Follow-Up: [facility] you and your health needs are our priority.  As part of our continuing mission to provide you with exceptional heart care, we have created designated Provider Care Teams.  These Care Teams include your primary Cardiologist (physician) and Advanced Practice Providers (APPs -  Physician Assistants and Nurse Practitioners) who all work together to provide you with the care you need, when you need it.  We recommend signing up for the patient portal called "MyChart".  Sign up information is provided on this After Visit Summary.  MyChart is used to connect with patients for Virtual Visits (Telemedicine).  Patients are able to view lab/test results, encounter notes, upcoming  appointments, etc.  Non-urgent messages can be sent to your provider as well.   To learn more about what you can do with MyChart, go to [email or web address]    Your next appointment:   1 week(s)  The format for your next appointment:   In Person  Provider:    You may see CNelva Bush MD or CLaurann Montana NP  Other Instructions

## 2020-04-15 ENCOUNTER — Encounter: Payer: Self-pay | Admitting: Hematology and Oncology

## 2020-04-15 LAB — BASIC METABOLIC PANEL
BUN/Creatinine Ratio: 10 — ABNORMAL LOW (ref 12–28)
BUN: 13 mg/dL (ref 8–27)
CO2: 17 mmol/L — ABNORMAL LOW (ref 20–29)
Calcium: 9 mg/dL (ref 8.7–10.3)
Chloride: 105 mmol/L (ref 96–106)
Creatinine, Ser: 1.35 mg/dL — ABNORMAL HIGH (ref 0.57–1.00)
GFR calc Af Amer: 47 mL/min/{1.73_m2} — ABNORMAL LOW (ref >59)
GFR calc non Af Amer: 40 mL/min/{1.73_m2} — ABNORMAL LOW (ref >59)
Glucose: 122 mg/dL — ABNORMAL HIGH (ref 65–99)
Potassium: 4.3 mmol/L (ref 3.5–5.2)
Sodium: 137 mmol/L (ref 134–144)

## 2020-04-15 LAB — IRON,TIBC AND FERRITIN PANEL
Ferritin: 271 ng/mL — ABNORMAL HIGH (ref 15–150)
Iron Saturation: >92 % (ref 15–55)
Iron: 206 ug/dL — ABNORMAL HIGH (ref 27–139)
Total Iron Binding Capacity: <223 ug/dL — ABNORMAL LOW (ref 250–450)
UIBC: <17 ug/dL — ABNORMAL LOW (ref 118–369)

## 2020-04-15 LAB — CBC
Hematocrit: 25 % — ABNORMAL LOW (ref 34.0–46.6)
Hemoglobin: 8.3 g/dL — ABNORMAL LOW (ref 11.1–15.9)
MCH: 28.1 pg (ref 26.6–33.0)
MCHC: 33.2 g/dL (ref 31.5–35.7)
MCV: 85 fL (ref 79–97)
Platelets: 128 10*3/uL — ABNORMAL LOW (ref 150–450)
RBC: 2.95 x10E6/uL — ABNORMAL LOW (ref 3.77–5.28)
RDW: 15 % (ref 11.7–15.4)
WBC: 3.1 10*3/uL — ABNORMAL LOW (ref 3.4–10.8)

## 2020-04-15 LAB — BRAIN NATRIURETIC PEPTIDE: BNP: 210.2 pg/mL — ABNORMAL HIGH (ref 0.0–100.0)

## 2020-04-15 LAB — VITAMIN B12: Vitamin B-12: 1880 pg/mL — ABNORMAL HIGH (ref 232–1245)

## 2020-04-15 NOTE — Progress Notes (Signed)
[facility]  [address], [address] [place], [place] [address] Phone: [phone]  Fax: [phone]   Clinic Day:  04/16/2020  Referring physician: Lorelee Market, MD  Chief Complaint: Crystal [name] is a 69 y.o. female with decompensated cirrhosis s/p TIPS procedure, right sided pulmonary nodules who is seen for 3 week assessment.  HPI: The patient was last seen in the medical oncology clinic on 03/30/2020. At that time, she was feeling tired. She noted shortness of breath when walking. EGD on 03/08/2020 revealed some angioectasias treated with APC.  Capsule study was pending.  Hematocrit was 27.3, hemoglobin 9.3, platelets 133,000, WBC 3100.  Ferritin was 20 with a sed rate of 42.   She received Venofer weekly x 3 (03/30/2020 - 04/13/2020).  Labs from 04/14/2020 from [facility]: Hematocrit 26.0, hemoglobin 8.7, MCV 83.0, platelets 127,000, WBC 3000, ANC 1800. Ferritin was 278.   During the interim, she says she is 'gushy' and 'fluidy'. She was seen by her cardiologist this week and she is expected to go back on 04/21/2020. She continued to be short of breath upon exertion. She is considering a blood transfusion.    Past Medical History:  Diagnosis Date  . Acute GI bleeding 02/09/2018  . Acute upper gastrointestinal bleeding 08/03/2018  . Anemia    TAKES IRON TAB  . Arthritis    SHOULDER  . B12 deficiency 02/18/2018  . Bleeding    GI  3/19  . Bronchitis    HX OF  . Cirrhosis ([place])   . Colon cancer screening   . Diabetes mellitus without complication ([place])    TYPE 2  . Fatty (change of) liver, not elsewhere classified   . Full dentures    UPPER AND LOWER  . Iron deficiency anemia due to chronic blood loss 02/18/2018  . Lung nodule, multiple   . Pernicious anemia 02/22/2018  . PUD (peptic ulcer disease)   . Raynaud's syndrome   . Upper GI bleeding 08/03/2018    Past Surgical History:  Procedure Laterality Date  . CATARACT EXTRACTION W/PHACO Right  02/06/2018   Procedure: CATARACT EXTRACTION PHACO AND INTRAOCULAR LENS PLACEMENT ([place]) RIGHT DIABETIC;  Surgeon: Leandrew Koyanagi, MD;  Location: [place];  Service: Ophthalmology;  Laterality: Right;  DIABETIC, ORAL MED  . CATARACT EXTRACTION W/PHACO Left 04/30/2018   Procedure: CATARACT EXTRACTION PHACO AND INTRAOCULAR LENS PLACEMENT ([facility]);  Surgeon: Leandrew Koyanagi, MD;  Location: [facility];  Service: Ophthalmology;  Laterality: Left;  [place] 01:04 AP% 21.3 CDE 13.66 Fluid pack lot # [id number]  . COLONOSCOPY WITH PROPOFOL N/A 03/25/2018   Procedure: COLONOSCOPY WITH PROPOFOL;  Surgeon: Lin Landsman, MD;  Location: [facility] ENDOSCOPY;  Service: Gastroenterology;  Laterality: N/A;  . DENTAL SURGERY     EXTRACTIONS  . ELECTROMAGNETIC NAVIGATION BROCHOSCOPY Right 01/27/2019   Procedure: ELECTROMAGNETIC NAVIGATION BRONCHOSCOPY;  Surgeon: [name], MD;  Location: [facility];  Service: Cardiopulmonary;  Laterality: Right;  . ESOPHAGOGASTRODUODENOSCOPY N/A 08/03/2018   Procedure: ESOPHAGOGASTRODUODENOSCOPY (EGD);  Surgeon: Lin Landsman, MD;  Location: [facility] ENDOSCOPY;  Service: Gastroenterology;  Laterality: N/A;  . ESOPHAGOGASTRODUODENOSCOPY (EGD) WITH PROPOFOL N/A 02/10/2018   Procedure: ESOPHAGOGASTRODUODENOSCOPY (EGD) WITH PROPOFOL;  Surgeon: Jonathon Bellows, MD;  Location: [facility] ENDOSCOPY;  Service: Gastroenterology;  Laterality: N/A;  . ESOPHAGOGASTRODUODENOSCOPY (EGD) WITH PROPOFOL N/A 03/25/2018   Procedure: ESOPHAGOGASTRODUODENOSCOPY (EGD) WITH PROPOFOL;  Surgeon: Lin Landsman, MD;  Location: [facility] ENDOSCOPY;  Service: Gastroenterology;  Laterality: N/A;  . ESOPHAGOGASTRODUODENOSCOPY (EGD) WITH PROPOFOL N/A 04/22/2018   Procedure:  ESOPHAGOGASTRODUODENOSCOPY (EGD) WITH PROPOFOL with band ligation;  Surgeon: Lin Landsman, MD;  Location: [place];  Service: Gastroenterology;  Laterality: N/A;  . ESOPHAGOGASTRODUODENOSCOPY (EGD) WITH PROPOFOL N/A 06/24/2018    Procedure: ESOPHAGOGASTRODUODENOSCOPY (EGD) WITH PROPOFOL;  Surgeon: Lin Landsman, MD;  Location: [facility] ENDOSCOPY;  Service: Gastroenterology;  Laterality: N/A;  . ESOPHAGOGASTRODUODENOSCOPY (EGD) WITH PROPOFOL N/A 11/19/2018   Procedure: ESOPHAGOGASTRODUODENOSCOPY (EGD) WITH PROPOFOL;  Surgeon: Lin Landsman, MD;  Location: [facility] ENDOSCOPY;  Service: Gastroenterology;  Laterality: N/A;  . ESOPHAGOGASTRODUODENOSCOPY (EGD) WITH PROPOFOL N/A 03/08/2020   Procedure: ESOPHAGOGASTRODUODENOSCOPY (EGD) WITH PROPOFOL;  Surgeon: Lin Landsman, MD;  Location: [facility] ENDOSCOPY;  Service: Gastroenterology;  Laterality: N/A;  . EYE SURGERY    . GIVENS CAPSULE STUDY N/A 03/29/2020   Procedure: GIVENS CAPSULE STUDY;  Surgeon: Lin Landsman, MD;  Location: [facility] ENDOSCOPY;  Service: Gastroenterology;  Laterality: N/A;  . HEMORRHOID BANDING  03/25/2018   Procedure: HEMORRHOID BANDING;  Surgeon: Lin Landsman, MD;  Location: [facility] ENDOSCOPY;  Service: Gastroenterology;;  . IR EMBO ART  VEN HEMORR LYMPH EXTRAV  INC GUIDE ROADMAPPING  08/07/2018  . IR RADIOLOGIST EVAL & MGMT  09/03/2018  . IR RADIOLOGIST EVAL & MGMT  12/17/2018  . IR RADIOLOGIST EVAL & MGMT  06/24/2019  . IR RADIOLOGIST EVAL & MGMT  01/22/2020  . IR TIPS  08/07/2018  . RADIOLOGY WITH ANESTHESIA N/A 08/07/2018   Procedure: TIPS;  Surgeon: Corrie Mckusick, DO;  Location: [place];  Service: Anesthesiology;  Laterality: N/A;  . TONSILLECTOMY      Family History  Problem Relation Age of Onset  . CAD Father   . Heart attack Father 30  . Cancer Maternal Uncle   . Seizures Mother     Social History:  reports that she has never smoked. She has never used smokeless tobacco. She reports previous alcohol use. She reports that she does not use drugs. She retired from [employer] in 11/2017. She lives in [place]. The patient is alone today.  Allergies: No Known Allergies  Current Medications: Current Outpatient Medications  Medication  Sig Dispense Refill  . cyanocobalamin (,VITAMIN B-12,) 1000 MCG/ML injection Inject 1,000 mcg into the muscle every 30 (thirty) days.     . furosemide (LASIX) 40 MG tablet Take 1 tablet (40 mg total) by mouth daily. (Patient taking differently: Take 40 mg by mouth daily. Patient taking 60 mg per NP no perscritption in chart) 90 tablet 2  . hydrOXYzine (ATARAX/VISTARIL) 10 MG tablet TAKE 1 TABLET (10 MG TOTAL) BY MOUTH 3 (THREE) TIMES DAILY AS NEEDED FOR ITCHING. 30 tablet 0  . lactulose (CHRONULAC) 10 GM/15ML solution Take 45 mLs (30 g total) by mouth 2 (two) times daily. 2700 mL 3  . metFORMIN (GLUCOPHAGE) 500 MG tablet Take 1,000 mg by mouth 2 (two) times daily with a meal.     . Misc. Devices KIT Moderate compression hose 20-30 MM HG 1 kit 0  . nystatin (MYCOSTATIN/NYSTOP) powder Apply 1 g topically as needed (rash).     . ondansetron (ZOFRAN) 4 MG tablet ondansetron HCl 4 mg tablet  TAKE 1 TABLET BY MOUTH EVERY 6 HOURS AS NEEDED    . OneTouch Delica Lancets 51W MISC 2 (two) times daily.    Glory Rosebush ULTRA test strip 2 (two) times daily.    . pantoprazole (PROTONIX) 40 MG tablet Take 40 mg by mouth 2 (two) times daily.    . potassium chloride (KLOR-CON) 10 MEQ tablet Take 1 tablet (  10 mEq total) by mouth daily. (Patient taking differently: Take 10 mEq by mouth daily. Patient taking 15 meq per NP no order in chart) 90 tablet 3  . rifaximin (XIFAXAN) 550 MG TABS tablet TAKE 1 TABLET (550 MG TOTAL) BY MOUTH 2 (TWO) TIMES DAILY. 180 tablet 1  . spironolactone (ALDACTONE) 50 MG tablet TAKE 1 TABLET BY MOUTH EVERY DAY 90 tablet 0   No current facility-administered medications for this visit.    Review of Systems  Constitutional: Positive for malaise/fatigue and weight loss (5 lb). Negative for chills, diaphoresis and fever.       Feels like she is 'gushy' and 'fluidy'.  HENT: Negative for congestion and sore throat.   Eyes: Negative for blurred vision and double vision.  Respiratory: Positive  for shortness of breath (with exertion). Negative for cough.   Cardiovascular: Positive for leg swelling (BLE). Negative for chest pain and palpitations.  Gastrointestinal: Negative for constipation, diarrhea, heartburn, nausea and vomiting.  Genitourinary: Negative for frequency and urgency.  Musculoskeletal: Negative for back pain, falls and myalgias.  Skin: Positive for itching (back at night; lower abdominal itch gone). Negative for rash.       Telangiectasias on face and back.   Neurological: Negative for dizziness, sensory change, weakness and headaches.  Endo/Heme/Allergies: Does not bruise/bleed easily.       Diabetes; on metformin.   Psychiatric/Behavioral: Negative for depression. The patient is not nervous/anxious.    Performance status (ECOG): 0 - Asymptomatic  Vitals Blood pressure 106/64, pulse 71, temperature 97.6 F (36.4 C), temperature source Tympanic, weight 189 lb 9.5 oz (86 kg).   Physical Exam Vitals and nursing note reviewed.  Constitutional:      General: She is not in acute distress.    Appearance: Normal appearance. She is well-developed and well-nourished. She is not diaphoretic.     Interventions: Face mask in place.  HENT:     Head: Normocephalic and atraumatic.     Right Ear: Hearing normal.     Left Ear: Hearing normal.     Mouth/Throat:     Mouth: Oropharynx is clear and moist and mucous membranes are normal. No oral lesions.      Comments: Shoulder length gray hair. Eyes:     Extraocular Movements: EOM normal.     Conjunctiva/sclera: Conjunctivae normal.     Pupils: Pupils are equal, round, and reactive to light.     Comments: Glasses. Blue eyes.   Neck:     Vascular: No JVD.  Cardiovascular:     Rate and Rhythm: Normal rate and regular rhythm.     Heart sounds: Normal heart sounds. No murmur heard.  No friction rub. No gallop.   Pulmonary:     Effort: Pulmonary effort is normal.     Breath sounds: Normal breath sounds. No wheezing,  rhonchi or rales.  Abdominal:     General: Bowel sounds are normal. There is no distension.     Palpations: Abdomen is soft. There is no hepatosplenomegaly or mass.     Tenderness: There is no abdominal tenderness. There is no CVA tenderness, guarding or rebound.  Musculoskeletal:        General: Edema (BLE) present. No tenderness. Normal range of motion.     Cervical back: Normal range of motion and neck supple.  Lymphadenopathy:     Cervical: No cervical adenopathy.     Right cervical: No superficial cervical adenopathy.    Upper Body:  No axillary adenopathy present.  Lower Body: No right inguinal adenopathy. No left inguinal adenopathy.  Skin:    General: Skin is warm, dry and intact.     Coloration: Skin is not pale.     Findings: No bruising, erythema, lesion or rash.     Comments: telangiectasias face and chest. Spider angiomas.  Neurological:     Mental Status: She is alert and oriented to person, place, and time.  Psychiatric:        Mood and Affect: Mood and affect normal.        Behavior: Behavior normal.        Thought Content: Thought content normal.        Judgment: Judgment normal.    Office Visit on 04/14/2020  Component Date Value Ref Range Status  . Total Iron Binding Capacity 04/14/2020 <223* 250 - 450 ug/dL Final  . UIBC 04/14/2020 <17* 118 - 369 ug/dL Final   **Verified by repeat analysis**  . Iron 04/14/2020 206* 27 - 139 ug/dL Final  . Iron Saturation 04/14/2020 >92* 15 - 55 % Final  . Ferritin 04/14/2020 271* 15 - 150 ng/mL Final  . WBC 04/14/2020 3.1* 3.4 - 10.8 x10E3/uL Final  . RBC 04/14/2020 2.95* 3.77 - 5.28 x10E6/uL Final  . Hemoglobin 04/14/2020 8.3* 11.1 - 15.9 g/dL Final  . Hematocrit 04/14/2020 25.0* 34.0 - 46.6 % Final  . MCV 04/14/2020 85  79 - 97 fL Final  . MCH 04/14/2020 28.1  26.6 - 33.0 pg Final  . MCHC 04/14/2020 33.2  31.5 - 35.7 g/dL Final  . RDW 04/14/2020 15.0  11.7 - 15.4 % Final  . Platelets 04/14/2020 128* 150 - 450  x10E3/uL Final  . Glucose 04/14/2020 122* 65 - 99 mg/dL Final  . BUN 04/14/2020 13  8 - 27 mg/dL Final  . Creatinine, Ser 04/14/2020 1.35* 0.57 - 1.00 mg/dL Final  . GFR calc non Af Amer 04/14/2020 40* >59 mL/min/1.73 Final  . GFR calc Af Amer 04/14/2020 47* >59 mL/min/1.73 Final   Comment: **[facility] currently reports eGFR in compliance with the current**   recommendations of the [employer]. [facility] will   update reporting as new guidelines are published from the NKF-ASN   Task force.   . BUN/Creatinine Ratio 04/14/2020 10* 12 - 28 Final  . Sodium 04/14/2020 137  134 - 144 mmol/L Final  . Potassium 04/14/2020 4.3  3.5 - 5.2 mmol/L Final  . Chloride 04/14/2020 105  96 - 106 mmol/L Final  . CO2 04/14/2020 17* 20 - 29 mmol/L Final  . Calcium 04/14/2020 9.0  8.7 - 10.3 mg/dL Final  . BNP 04/14/2020 210.2* 0.0 - 100.0 pg/mL Final  . Vitamin B-12 04/14/2020 1,880* 232 - 1,245 pg/mL Final    Assessment:  Keymiah A Savo is a 69 y.o. female with a right lower lobe pulmonary nodule. She denies any smoking history.  Abdomen and pelvic CTon 02/09/2018 revealed an 1.8 cm spiculated density in right lower lobe concerning for malignancy. There was mildly nodular hepatic contourconcerning for hepatic cirrhosis. There was moderate splenomegalysuggesting portal venous hypertension. Mild ascites was noted. There were mildly enlarged retroperitoneal lymph nodes (largest 1.1 cm) concerning for possible metastatic disease or malignancy. There was a 7 cm left ovarian cyst. Further evaluation with MRI was recommended to evaluate for possible neoplasm. CA125was 351.3 and CEA1.1 on 02/21/2018.   Chest CTon 02/25/2018 revealed a 2.3 x 1.5 x 1.0 cm perifissural nodulewith spiculated margins in the right middle lobe. There was a 1.5 x  1.5 x 1.4 cm right lower lobe spiculated nodule. There was a moderate left sided pleural effusion.   PET scanon 03/08/2018 revealed a 2.5 x 1.8 cm sub  solid nodular opacity in theRML(SUV 2.7) and a 1.6 cm roundedslightly spiculated RLL pulmonary nodule(SUV 2.1). There were no enlarged or hypermetabolic mediastinal or hilar lymph nodes. There was a small left pleural effusion. 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  Chest CTon 05/15/2018 revealed no significant changes in the 2 previously noted nodules in the RIGHT lung. Irregular RML lesion measures 2.4 x 1.4 cm (previously 2.3 x 1.5 cm). Spiculated central RLL lesion measures 1.8 x 1.4 cm (previously 1.6 x 1.5 cm). Both areas remain concerning for neoplasm. There was interval decrease in ascites volume overall. Upper abdominal lymphadenopathy noted that was felt to be reactive due to underlying liver disease.   Chest CTon 08/15/2018 revealed the spiculated right lower lobe lung lesion appeared slightly increased and was not significantly changed in size compared with previous exam. Morphologically, this was worrisome for a small bronchogenic neoplasm. The right middle lobe lung nodule was slightly decreased in size in the interval and may be post infectious or inflammatory in etiology.  Super D chest CTon 01/24/2019 revealed a 3.2 x 2.3 cm ill-defined anterior right lung nodule compared to 2.9 x 2.3 cm previously. Minor fissurewasnot well, and as such, this nodule can not be confidently localized to the right upper or middle lobe. The right lower lobe nodule measured2.0 x 2.0 cm comparedto 2.0 x 1.9 cm.  Chest CTon 08/07/2019 revealed no significant interval change in size right middle lobe and right lower lobe lung nodules.Liverwascompatible with cirrhosiss/pTIPS.Chest CTon 02/02/2020 revealed stable nodules within the right middle and right lower lobes.Therewerecirrhotic changesof the liver with TIPS shunt in place. There was mild prominence of the ascending aorta to 4 cm.  Recommendedannual imaging followup by CTA or MRA.  She was admitted to [facility] from 02/09/2018 - 04/02/2019with a variceal hemorrhage. EGDon 02/10/2018 revealed 5 columns of grade III esophageal varices in the lower third of the esophagus. There was stigmata of recent bleeding and red wale signs. She underwent variceal ligation x 10. She required 3 units of PRBCs.  EGDon 03/25/2018 revealed 2 angiectasias(non-bleeding) in the second portion of the duodenum, and a few diminutive (non-bleeding) angiectasias in the prepyloric region of the stomach that were treated APC. There were 4 non-bleeding ulcers (Forrest Class III) found in the gastric fundus. There were 4 columns of large non-bleeding varicesin the lower third of the esophagus, of which demonstrated no stigmata of bleeding. Varices were banded. EGD on 04/22/2018 revealed one non-bleeding cratered gastric ulcer(Forrest Class III) in the lesser curvature of the gastric body. Duodenal bulb and second portion of the duodenum were normal. Moderate portal hypertensive gastropathy noted in the stomach. Large varices in the lower third of the esophagus noted. 2 bands were placed with incomplete eradication of the lesions. EGD on 03/08/2020 revealed a few non-bleeding angioectasias in the duodenum and a gastric antral vascular ectasia with bleeding treated with argon plasma coagulation (APC).  There was a single non-bleeding angioectasia in the stomach treated with bipolar cautery. Gastroesophageal junction and esophagus were normal. No specimens were collected.  Colonoscopyon 03/25/2018 revealed a single 5 mm sessile polyp in the ascending colon. Patient has rectal varices and external hemorrhoids. Pathology returned as tubular adenoma, and was negative for high grade dysplasia and malignancy.   She has iron deficiency anemia. Ferritinwas  6. She is on ferrous sulfate 325 mg TID. She hasB12 deficiency. B12was 103. Intrinsic factor  antibody was negative. 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 She began B12 injections on 03/06/2018 (last05/18/2021). Folate was12.3on12/14/2020. B12 was 485 on 05/24/2018. Dietis good.  She received Venoferweekly x 4 (07/12/2018 - 07/31/2018), x 3 (12/22/2019 -01/05/2020), and x 3 (03/30/2020 - 04/13/2020).  Ferritinhas been followed: 6 on 02/09/2018, 16 on 05/24/2018, 13 on 07/10/2018, 374 on 08/15/2018, 485 on 08/21/2018, 114 on 09/25/2018, 63 on 11/20/2018, 35 on 02/12/2019, 22 on 04/24/2019, 41 on 09/22/2019, 41 on 10/27/2019, 21 on 12/15/2019, 22 on 03/22/2020, 20 on 03/26/2020, and 278 on 04/14/2020.  She has hepatitis C antibodies. HCV RNA was not detected. She has been exposed to hepatitis B. Anti-smooth muscle antibodiesare weakly positive. ANAwas positive (>1:1280 centromere antibody). Negative studiesincluded: ceruloplasmin, anti-mitochondrial antibodies, hepatitis B IgM, HIV, and alpha-1 antitrypsin. PT was 15.2 (INR 1.21). AFPwas 1.5 on 05/28/2018,2.4 on 11/20/2018, and 2.0 on 04/24/2019.  She has a history of decompensated cirrhosiss/p TIPSprocedure 08/07/2018. She has portal hypertension and a history of recurrent bleeding esophageal varices.She was given a 2-year life expectancyfollowing her TIPS procedure. Abdominal/pelvic arterial/venous ultrasound doppleron 04/24/2019 revealed a patent TIPS and no evidence of persisting ascites.  Abdominal MRI including MRCPon 11/12/2019 revealed cirrhosis with TIPS in place.There was no biliary duct dilatation.There was cholelithiasis without acute cholecystitis. Right hepatic lobe 9 mm lesion whichwas indeterminate, LR 3.This was possibly present on the prior CT of 2019. Recommendation includedrepeat pre and post contrast abdominal MRI at 3-6 months. There as small volume abdominal ascites.The right lower lobe pulmonary nodulewas suboptimally evaluated.  [facility] with[facility].HeadCT revealedno acute intracranial pathology. Head MRIwo contrast showed symmetric FLAIR hyperintensity within the bilateral mamillary bodies. This finding may be seen in the setting of Wernicke encephalopathy.There was no evidence of acute infarct and mild chronic small vessels with ischemic disease.Her mental status returned to baseline.  RUQ ultrasoundon 05/29/2018 revealed cholelithiasis. There was a thickened edematous gallbladder wall. The wall thickening could be due to hypoproteinemia/hypoalbuminemia or cholecystitis. Negative sonographic Murphy's sign. There was a heterogeneous slightly nodular liver consistent with cirrhosis.  Pelvic ultrasoundon 03/18/2018 demonstrated a simple cyst in the LEFT ovarymeasuring 6.1 x 4.5 x 6.6 cm. Repeat imaging recommended in 1 year to document stability.  Symptomatically, she has shortness of breath upon exertion. She is considering a blood transfusion.    Labs from 04/14/2020 from [facility]: Hematocrit 26.0, hemoglobin 8.7, MCV 83.0, platelets 127,000, WBC 3000, ANC 1800. Ferritin was 278.    Plan: 1.   Review interval [facility] labs. 2.Decompensated cirrhosis s/p TIPS Clinically,she is doing fair. Life expectancy is limited s/pTIPS (2 years). Shedenies anyexcess bruising or bleeding. AFPwas 2.0on06/09/2019. Patient is monitored closely byGI. 3.Pulmonary nodules Chest CT on 09/24/2020revealed no significant interval change in sizeRMLand RLLlung nodules. Plan for 77-monthfollow-up imaging 4.Iron deficiency anemia Hematocrit26.0. Hemoglobin8.7. MCV3on06/12/2019. Ferritinwas278. IV iron if ferritin < 30. No Venofer today. Discuss plans for additional work-up to assess anemia. 5.B12 deficiency Patient receives B12 monthly (last05/18/2021) Continue B12 monthly. 6.Left  ovarian cyst Pelvic ultrasoundon05/04/2018 revealed a simple cyst in the left ovary (6.1 x 4.5 x 6.6 cm). Discuss initial plan for follow-up imaging in 1 year(late). Patient declines follow-up. 7.Cancel future IV iron appointments. 8.   RTC [place] [facility] (CBC, retic, folate, 10. TSH, LDH, peripheral smear for path review, DAT, and hold tube). 9.   RTC on 04/22/2020  for possible PRBC transfusion.  I discussed the assessment and treatment plan with the patient.  The patient was provided an opportunity to ask questions and all were answered.  The patient agreed with the plan and demonstrated an understanding of the instructions.  The patient was advised to call back if the symptoms worsen or if the condition fails to improve as anticipated.   Lequita Asal, MD, PhD    04/16/2020, 2:25 PM  I, Heywood Footman, am acting as Education administrator for [employer]. Mike Gip, MD, PhD.  I, Jayla Mackie C. Mike Gip, MD, have reviewed the above documentation for accuracy and completeness, and I agree with the above.

## 2020-04-16 ENCOUNTER — Other Ambulatory Visit: Payer: Self-pay

## 2020-04-16 ENCOUNTER — Telehealth: Payer: Self-pay

## 2020-04-16 ENCOUNTER — Encounter: Payer: Self-pay | Admitting: Hematology and Oncology

## 2020-04-16 ENCOUNTER — Other Ambulatory Visit: Payer: Self-pay | Admitting: *Deleted

## 2020-04-16 ENCOUNTER — Inpatient Hospital Stay (HOSPITAL_BASED_OUTPATIENT_CLINIC_OR_DEPARTMENT_OTHER): Payer: Medicare HMO | Admitting: Hematology and Oncology

## 2020-04-16 VITALS — BP 106/64 | HR 71 | Temp 97.6°F | Wt 189.6 lb

## 2020-04-16 DIAGNOSIS — E538 Deficiency of other specified B group vitamins: Secondary | ICD-10-CM

## 2020-04-16 DIAGNOSIS — D500 Iron deficiency anemia secondary to blood loss (chronic): Secondary | ICD-10-CM

## 2020-04-16 DIAGNOSIS — R918 Other nonspecific abnormal finding of lung field: Secondary | ICD-10-CM

## 2020-04-16 DIAGNOSIS — E611 Iron deficiency: Secondary | ICD-10-CM | POA: Diagnosis not present

## 2020-04-16 DIAGNOSIS — Z7189 Other specified counseling: Secondary | ICD-10-CM

## 2020-04-16 DIAGNOSIS — D631 Anemia in chronic kidney disease: Secondary | ICD-10-CM | POA: Diagnosis not present

## 2020-04-16 DIAGNOSIS — N1832 Chronic kidney disease, stage 3b: Secondary | ICD-10-CM | POA: Diagnosis not present

## 2020-04-16 NOTE — Telephone Encounter (Signed)
Loel Dubonnet, NP  P Cv Div [place] Triage  Stable kidney function and electrolytes. Await results of BNP.   We drew B12, CBC, iron for monitoring by her hematologist. She follows closely with them and we were avoiding her getting stuck twice. I have forwarded to Dr. Mike Gip (hematology/oncology) for her review.    Call to patient to review labs.    Pt verbalized understanding and has no further questions at this time.    Advised pt to call for any further questions or concerns.  No further orders.

## 2020-04-16 NOTE — Telephone Encounter (Signed)
-----  Message from Berniece Salines, DO sent at 04/15/2020  6:26 PM EDT -----   Please let the patient know that her BNP was 212 which is elevated.  We will keep her on the diuretic as Caitlyn had recommended.  Thankfully her creatinine compared to 2 months ago is slightly better.

## 2020-04-19 ENCOUNTER — Telehealth: Payer: Self-pay | Admitting: Internal Medicine

## 2020-04-19 NOTE — Telephone Encounter (Signed)
No additional labs needed, thank you!  Loel Dubonnet, NP

## 2020-04-19 NOTE — Telephone Encounter (Signed)
No answer. Left detailed message, ok per DPR, with information that no additional labs needed and that the appointment note for the labs has what is necessary.

## 2020-04-19 NOTE — Telephone Encounter (Signed)
Patient calling  Patient will be getting lab work tomorrow and would like to know if C Gilford Rile will want any lab work completed for her upcoming appointment on Wed. 06/09 Please call to discuss

## 2020-04-20 ENCOUNTER — Other Ambulatory Visit: Payer: Self-pay

## 2020-04-20 ENCOUNTER — Other Ambulatory Visit
Admission: RE | Admit: 2020-04-20 | Discharge: 2020-04-20 | Disposition: A | Discharge status: Home / Self Care | Payer: Medicare HMO | Attending: Hematology and Oncology | Admitting: Hematology and Oncology

## 2020-04-20 ENCOUNTER — Inpatient Hospital Stay: Payer: Medicare HMO

## 2020-04-20 ENCOUNTER — Telehealth: Payer: Self-pay

## 2020-04-20 DIAGNOSIS — D500 Iron deficiency anemia secondary to blood loss (chronic): Secondary | ICD-10-CM | POA: Diagnosis present

## 2020-04-20 LAB — CBC
HCT: 31.4 % — ABNORMAL LOW (ref 36.0–46.0)
Hemoglobin: 9.8 g/dL — ABNORMAL LOW (ref 12.0–15.0)
MCH: 28.2 pg (ref 26.0–34.0)
MCHC: 31.2 g/dL (ref 30.0–36.0)
MCV: 90.2 fL (ref 80.0–100.0)
Platelets: 125 10*3/uL — ABNORMAL LOW (ref 150–400)
RBC: 3.48 MIL/uL — ABNORMAL LOW (ref 3.87–5.11)
RDW: 17 % — ABNORMAL HIGH (ref 11.5–15.5)
WBC: 3.7 10*3/uL — ABNORMAL LOW (ref 4.0–10.5)
nRBC: 0 % (ref 0.0–0.2)

## 2020-04-20 LAB — DAT, POLYSPECIFIC AHG (ARMC ONLY): Polyspecific AHG test: NEGATIVE

## 2020-04-20 LAB — RETIC PANEL
Immature Retic Fract: 13.5 % (ref 2.3–15.9)
RBC.: 3.47 MIL/uL — ABNORMAL LOW (ref 3.87–5.11)
Retic Count, Absolute: 87.8 10*3/uL (ref 19.0–186.0)
Retic Ct Pct: 2.5 % (ref 0.4–3.1)
Reticulocyte Hemoglobin: 31.1 pg (ref >27.9)

## 2020-04-20 LAB — PATHOLOGIST SMEAR REVIEW

## 2020-04-20 LAB — SAMPLE TO BLOOD BANK

## 2020-04-20 LAB — FOLATE: Folate: 19.2 ng/mL (ref >5.9)

## 2020-04-20 NOTE — Telephone Encounter (Signed)
Patient verbalized understanding of results

## 2020-04-20 NOTE — Telephone Encounter (Signed)
Capsule study results:  Non specific no bleeding tiny erythematous spots in small bowel. If patient develops worse anemia recommend a push enteroscopy

## 2020-04-20 NOTE — Progress Notes (Signed)
Office Visit    Patient Name: Crystal [name] Date of Encounter: 04/21/2020  Primary Care Provider:  Lorelee Market, MD Primary Cardiologist:  Nelva Bush, MD Electrophysiologist:  None   Chief Complaint    [name] is a 69 y.o. female with a hx of chronic diastolic heart failure, cirrhosis complicated by esophageal varices requiring endoscopic treatment and TI PS, PUD, DM2, lung nodules with nondiagnostic biopsy 01/2019, B12 deficiency, iron deficiency anemia presents today for follow-up of diastolic heart failure  Past Medical History    Past Medical History:  Diagnosis Date  . Acute GI bleeding 02/09/2018  . Acute upper gastrointestinal bleeding 08/03/2018  . Anemia    TAKES IRON TAB  . Arthritis    SHOULDER  . B12 deficiency 02/18/2018  . Bleeding    GI  3/19  . Bronchitis    HX OF  . Cirrhosis ([place])   . Colon cancer screening   . Diabetes mellitus without complication ([place])    TYPE 2  . Fatty (change of) liver, not elsewhere classified   . Full dentures    UPPER AND LOWER  . Iron deficiency anemia due to chronic blood loss 02/18/2018  . Lung nodule, multiple   . Pernicious anemia 02/22/2018  . PUD (peptic ulcer disease)   . Raynaud's syndrome   . Upper GI bleeding 08/03/2018   Past Surgical History:  Procedure Laterality Date  . CATARACT EXTRACTION W/PHACO Right 02/06/2018   Procedure: CATARACT EXTRACTION PHACO AND INTRAOCULAR LENS PLACEMENT ([place]) RIGHT DIABETIC;  Surgeon: Leandrew Koyanagi, MD;  Location: [place];  Service: Ophthalmology;  Laterality: Right;  DIABETIC, ORAL MED  . CATARACT EXTRACTION W/PHACO Left 04/30/2018   Procedure: CATARACT EXTRACTION PHACO AND INTRAOCULAR LENS PLACEMENT ([facility]);  Surgeon: Leandrew Koyanagi, MD;  Location: [facility];  Service: Ophthalmology;  Laterality: Left;  [place] 01:04 AP% 21.3 CDE 13.66 Fluid pack lot # [id number]  . COLONOSCOPY WITH PROPOFOL N/A 03/25/2018   Procedure: COLONOSCOPY WITH PROPOFOL;   Surgeon: Lin Landsman, MD;  Location[facility] ENDOSCOPY;  Service: Gastroenterology;  Laterality: N/A;  . DENTAL SURGERY     EXTRACTIONS  . ELECTROMAGNETIC NAVIGATION BROCHOSCOPY Right 01/27/2019   Procedure: ELECTROMAGNETIC NAVIGATION BRONCHOSCOPY;  Surgeon: [name], MD;  Location: [facility];  Service: Cardiopulmonary;  Laterality: Right;  . ESOPHAGOGASTRODUODENOSCOPY N/A 08/03/2018   Procedure: ESOPHAGOGASTRODUODENOSCOPY (EGD);  Surgeon: Lin Landsman, MD;  Location: [facility] ENDOSCOPY;  Service: Gastroenterology;  Laterality: N/A;  . ESOPHAGOGASTRODUODENOSCOPY (EGD) WITH PROPOFOL N/A 02/10/2018   Procedure: ESOPHAGOGASTRODUODENOSCOPY (EGD) WITH PROPOFOL;  Surgeon: Jonathon Bellows, MD;  Location: [facility] ENDOSCOPY;  Service: Gastroenterology;  Laterality: N/A;  . ESOPHAGOGASTRODUODENOSCOPY (EGD) WITH PROPOFOL N/A 03/25/2018   Procedure: ESOPHAGOGASTRODUODENOSCOPY (EGD) WITH PROPOFOL;  Surgeon: Lin Landsman, MD;  Location: [facility] ENDOSCOPY;  Service: Gastroenterology;  Laterality: N/A;  . ESOPHAGOGASTRODUODENOSCOPY (EGD) WITH PROPOFOL N/A 04/22/2018   Procedure: ESOPHAGOGASTRODUODENOSCOPY (EGD) WITH PROPOFOL with band ligation;  Surgeon: Lin Landsman, MD;  Location: [place];  Service: Gastroenterology;  Laterality: N/A;  . ESOPHAGOGASTRODUODENOSCOPY (EGD) WITH PROPOFOL N/A 06/24/2018   Procedure: ESOPHAGOGASTRODUODENOSCOPY (EGD) WITH PROPOFOL;  Surgeon: Lin Landsman, MD;  Location: [facility] ENDOSCOPY;  Service: Gastroenterology;  Laterality: N/A;  . ESOPHAGOGASTRODUODENOSCOPY (EGD) WITH PROPOFOL N/A 11/19/2018   Procedure: ESOPHAGOGASTRODUODENOSCOPY (EGD) WITH PROPOFOL;  Surgeon: Lin Landsman, MD;  Location: [facility] ENDOSCOPY;  Service: Gastroenterology;  Laterality: N/A;  . ESOPHAGOGASTRODUODENOSCOPY (EGD) WITH PROPOFOL N/A 03/08/2020   Procedure: ESOPHAGOGASTRODUODENOSCOPY (EGD) WITH PROPOFOL;  Surgeon: Lin Landsman, MD;  Location: [facility] ENDOSCOPY;  Service:  Gastroenterology;  Laterality: N/A;  . EYE SURGERY    . GIVENS CAPSULE STUDY N/A 03/29/2020   Procedure: GIVENS CAPSULE STUDY;  Surgeon: Lin Landsman, MD;  Location: [facility] ENDOSCOPY;  Service: Gastroenterology;  Laterality: N/A;  . HEMORRHOID BANDING  03/25/2018   Procedure: HEMORRHOID BANDING;  Surgeon: Lin Landsman, MD;  Location: [facility] ENDOSCOPY;  Service: Gastroenterology;;  . IR EMBO ART  VEN HEMORR LYMPH EXTRAV  INC GUIDE ROADMAPPING  08/07/2018  . IR RADIOLOGIST EVAL & MGMT  09/03/2018  . IR RADIOLOGIST EVAL & MGMT  12/17/2018  . IR RADIOLOGIST EVAL & MGMT  06/24/2019  . IR RADIOLOGIST EVAL & MGMT  01/22/2020  . IR TIPS  08/07/2018  . RADIOLOGY WITH ANESTHESIA N/A 08/07/2018   Procedure: TIPS;  Surgeon: Corrie Mckusick, DO;  Location: [place];  Service: Anesthesiology;  Laterality: N/A;  . TONSILLECTOMY      Allergies  No Known Allergies  History of Present Illness    Crystal [name] is a 69 y.o. female with a hx of chronic diastolic heart failure, cirrhosis complicated by esophageal varices requiring endoscopic treatment and TI PS, PUD, DM2, lung nodules with nondiagnostic biopsy 01/2019, B12 deficiency, iron deficiency anemia last seen 04/14/20.  She has had coronary artery calcification and aortic atherosclerosis incidentally noted on prior CTs.  No symptoms suggestive of CAD.  Echo 04/04/2019 LVEF 55 to 60%, mild LVH, LV diastolic parameters consistent with impaired relaxation, RV normal size and function, no significant valvular abnormalities, mild dilation of aortic root 3.5 cm.  Clinic visit 12/2019 recommended to increase Lasix to 80m until weight returned to baseline as she was 6 pounds up from previous visit. Noted stable DOE and mild leg swelling. Underwent EGD 03/08/2020 with GI and capsule study 03/29/2020.  Follows with oncology for B12 deficiency and iron deficiency anemia. Had lower extremity duplex 03/30/20 ordered by oncology due to LE edema with no evidence of acute DVT  bilaterally. Noted chronic DVT, calcified, at the junction of distal common femoral vein and origin of the femoral vein with diminished flow focally at this site. As it was chronic, not recommended for anticoagulation especially in setting of anemia.   Clinic visit 04/14/20 reported increased swelling and abdominal bloating for 2 weeks. She was 9 lbs over her dry weight. Recommended to increase to Lasix 657mfor one week.   She is down 7 lbs from recent office visit. Has upcoming infusion tomorrow with hematology for blood infusion.  Is hopeful this will improve how she feels..   Legs feel well in the morning and notices her swelling is dramatically lower in the mornings.  Improvement in lower extremity edema and abdominal bloating since increase Lasix dose.. Marland KitchenSwelling increases throughout the day. Stands most of the day. 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  We discussed adding in periods of keeping her legs elevated throughout the day.  Reports her breathing is "pretty good". Gets out of breath with doing more than usual activity - this happens intermittently.  Reports this is stable at her baseline.  EKGs/Labs/Other Studies Reviewed:   The following studies were reviewed today:  LE duplex 03/30/20 IMPRESSION: 1. No evidence of deep venous thrombosis in either lower extremity in the right lower extremity.   2. Apparent chronic deep venous thrombosis, calcified, at the junction of the distal common femoral vein and origin of the femoral vein with diminished flow focally at this site. No acute appearing  deep venous thrombosis seen on the left. No other site of chronic  Echo 03/2019 1. The left ventricle has normal systolic function, with an ejection  fraction of 55-60%. The cavity size was normal. There is mildly increased  left ventricular wall thickness. Left ventricular diastolic Doppler  parameters are consistent with impaired  relaxation.   2. The right ventricle has normal  systolic function. The cavity was  normal. There is no increase in right ventricular wall thickness. Unable  to estimate RVSP   3. Left atrial size was mildly dilated.   4. There is dilatation of the aortic root 3.5 cm   EKG:  EKG is ordered today.  The ekg ordered today demonstrates SR 71 bpm with left axis deviation and no acute ST/T wave changes  Recent Labs: 08/21/2019: Magnesium 1.8 01/27/2020: ALT 20 04/14/2020: BNP 210.2; BUN 13; Creatinine, Ser 1.35; Potassium 4.3; Sodium 137 04/20/2020: Hemoglobin 9.8; Platelets 125; TSH 2.880  Recent Lipid Panel    Component Value Date/Time   TRIG 163 (H) 08/03/2018 2152    Home Medications   Current Meds  Medication Sig  . cyanocobalamin (,VITAMIN B-12,) 1000 MCG/ML injection Inject 1,000 mcg into the muscle every 30 (thirty) days.   . furosemide (LASIX) 40 MG tablet Take 1 tablet (40 mg total) by mouth daily. (Patient taking differently: Take 60 mg by mouth daily. Patient taking 60 mg per NP no prescripttion in chart)  . hydrOXYzine (ATARAX/VISTARIL) 10 MG tablet TAKE 1 TABLET (10 MG TOTAL) BY MOUTH 3 (THREE) TIMES DAILY AS NEEDED FOR ITCHING.  . lactulose (CHRONULAC) 10 GM/15ML solution Take 45 mLs (30 g total) by mouth 2 (two) times daily.  . metFORMIN (GLUCOPHAGE) 500 MG tablet Take 1,000 mg by mouth 2 (two) times daily with a meal.   . Misc. Devices KIT Moderate compression hose 20-30 MM HG  . nystatin (MYCOSTATIN/NYSTOP) powder Apply 1 g topically as needed (rash).   . ondansetron (ZOFRAN) 4 MG tablet ondansetron HCl 4 mg tablet  TAKE 1 TABLET BY MOUTH EVERY 6 HOURS AS NEEDED  . OneTouch Delica Lancets 27P MISC 2 (two) times daily.  Glory Rosebush ULTRA test strip 2 (two) times daily.  . pantoprazole (PROTONIX) 40 MG tablet Take 40 mg by mouth 2 (two) times daily.  . potassium chloride (KLOR-CON) 10 MEQ tablet Take 1 tablet (10 mEq total) by mouth daily. (Patient taking differently: Take 10 mEq by mouth daily. Patient taking 15 meq per NP no  order in chart)  . rifaximin (XIFAXAN) 550 MG TABS tablet TAKE 1 TABLET (550 MG TOTAL) BY MOUTH 2 (TWO) TIMES DAILY.  Marland Kitchen spironolactone (ALDACTONE) 50 MG tablet TAKE 1 TABLET BY MOUTH EVERY DAY    Review of Systems    Review of Systems  Constitution: Negative for chills, fever and malaise/fatigue.  Cardiovascular: Positive for dyspnea on exertion and leg swelling. Negative for chest pain, near-syncope, orthopnea, palpitations and syncope.  Respiratory: Positive for shortness of breath. Negative for cough and wheezing.   Gastrointestinal: Positive for bloating. Negative for nausea and vomiting.  Neurological: Negative for dizziness, light-headedness and weakness.   All other systems reviewed and are otherwise negative except as noted above.  Physical Exam    VS:  BP 116/62 (BP Location: Left Arm, Patient Position: Sitting, Cuff Size: Normal)   Ht 5' 2" (1.575 m)   Wt 184 lb 4 oz (83.6 kg)   LMP  (LMP Unknown)   SpO2 98%   BMI 33.70 kg/m  , BMI  Body mass index is 33.7 kg/m. GEN: Well nourished, well developed, in no acute distress. HEENT: normal. Neck: Supple, no JVD, carotid bruits, or masses. Cardiac: RRR, no murmurs, rubs, or gallops. No clubbing, cyanosis. Bilateral LE 1+ pitting edema. No upper extremity edema noted.  Radials/PT 2+ and equal bilaterally.  Respiratory:  Respirations regular and unlabored, clear to auscultation bilaterally. GI: Soft, nontender, nondistended MS: No deformity or atrophy. Skin: Warm and dry, no rash. Neuro:  Strength and sensation are intact. Psych: Normal affect.   Assessment & Plan    1. Chronic diastolic heart failure - Echo 03/2019 LVEF 55-60%, mild LVH, diastolic parameters consistent with impaired relaxation, RV normal size and function, mild dilation aortic root 3.5cm.  Weight down 7 pounds over the last week.  Improvement in abdominal bloating and arm swelling.  Continued 1+ lower extremity edema.  Continue Lasix 60 mg daily and K-Dur 15  mEq daily for 1 week.  She receives blood transfusion tomorrow which I anticipate improvement in her anemia will also precipitate improvement in her volume status.  After 1 week, reduced Lasix to alternate 60 mg and 40 mg every other day with K-Dur 50 mEq and 10 mEq every other day.  Plan for repeat echocardiogram for reassessment of LVEF.  Will require BMP with upcoming labs with hematology and will request add on. If volume status does not continue to improve, consider transition to Torsemide.   2. Iron deficiency anemia/B12 deficiency - Follows with Dr. Mike Gip.  Plan for transfusion tomorrow.  Encouraged her to take 60 mg Lasix after transfusion.  3. 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 No indication for anticoagulation as DVT is well calcified. No indication for referral to vascular surgery. LE edema primarily diastolic heart failure, cirrhosis, venous insufficiency in etiology. Changes to diuretic, as above. Recommend elevating lower extremities when sitting, low sodium diet, compression stockings.   4. Coronary artery calcification on CT and aortic atherosclerosis - No anginal symptoms. No indication for ischemic evaluation at this time.   5. Cirrhosis -follows with GI. Likely contributory to fluid retention.   Disposition: Follow up in 5 week(s) with Dr. Saunders Revel or APP.    Loel Dubonnet, NP 04/21/2020, 11:02 AM

## 2020-04-21 ENCOUNTER — Ambulatory Visit (INDEPENDENT_AMBULATORY_CARE_PROVIDER_SITE_OTHER): Payer: Medicare HMO | Admitting: Family

## 2020-04-21 ENCOUNTER — Encounter: Payer: Self-pay | Admitting: Family

## 2020-04-21 VITALS — BP 116/62 | Ht 62.0 in | Wt 184.2 lb

## 2020-04-21 DIAGNOSIS — I5032 Chronic diastolic (congestive) heart failure: Secondary | ICD-10-CM | POA: Diagnosis not present

## 2020-04-21 DIAGNOSIS — I2510 Atherosclerotic heart disease of native coronary artery without angina pectoris: Secondary | ICD-10-CM | POA: Diagnosis not present

## 2020-04-21 DIAGNOSIS — K7460 Unspecified cirrhosis of liver: Secondary | ICD-10-CM | POA: Diagnosis not present

## 2020-04-21 DIAGNOSIS — D500 Iron deficiency anemia secondary to blood loss (chronic): Secondary | ICD-10-CM | POA: Diagnosis not present

## 2020-04-21 LAB — THYROID PANEL WITH TSH
Free Thyroxine Index: 1.8 (ref 1.2–4.9)
T3 Uptake Ratio: 24 % (ref 24–39)
T4, Total: 7.4 ug/dL (ref 4.5–12.0)
TSH: 2.88 u[IU]/mL (ref 0.450–4.500)

## 2020-04-21 MED ORDER — FUROSEMIDE 40 MG PO TABS: ORAL_TABLET | ORAL | 2 refills | Status: DC

## 2020-04-21 MED ORDER — POTASSIUM CHLORIDE CRYS ER 10 MEQ PO TBCR: EXTENDED_RELEASE_TABLET | ORAL | 3 refills | Status: DC

## 2020-04-21 NOTE — Patient Instructions (Addendum)
Medication Instructions:   1) Lasix (furosemide) 40 mg-   CONTINUE Lasix 40 mg- 1.5 tablets (60 mg) daily for one week  After one week, CHANGE Lasix to alternate 1 tablet (53m) once every other day with 1.5 tablets (627m once every other day.   2) Potassium 10 meq-  CONTINUE potassium 10 meq- take 1.5 tablets (15 meq) once daily for one week  After one week, CHANGE potassium 10 meq to alternate 1 tablet (10 meq) once every other day with 1.5 tablets (15 meq) once every other day   *If you need a refill on your cardiac medications before your next appointment, please call your pharmacy*  Lab Work: None ordered today.  Would like to have your kidneys checked (lab called BMET) in the next two weeks. If Dr. CoMike Gips doing labs we will ask her to add on.  If you have labs (blood work) drawn today and your tests are completely normal, you will receive your results only by: . Marland KitchenyChart Message (if you have MyChart) OR . A paper copy in the mail If you have any lab test that is abnormal or we need to change your treatment, we will call you to review the results.  Testing/Procedures: Your physician has requested that you have an echocardiogram. Echocardiography is a painless test that uses sound waves to create images of your heart. It provides your doctor with information about the size and shape of your heart and how well your heart's chambers and valves are working. This procedure takes approximately one hour. There are no restrictions for this procedure.  Follow-Up: [facility] are our [facility].  As part of our continuing mission to provide you with exceptional heart care, we have created designated Provider Care Teams.  These Care Teams include your primary Cardiologist (physician) and Advanced Practice Providers (APPs -  Physician Assistants and Nurse Practitioners) who all work together to provide you with the care you need, when you need it.  We  recommend signing up for the patient portal called "MyChart".  Sign up information is provided on this After Visit Summary.  MyChart is used to connect with patients for Virtual Visits (Telemedicine).  Patients are able to view lab/test results, encounter notes, upcoming appointments, etc.  Non-urgent messages can be sent to your provider as well.   To learn more about what you can do with MyChart, go to [email or web address]   Your next appointment:   5 week(s)  The format for your next appointment:   In Person  Provider:   You may see ChNelva BushMD or one of the following Advanced Practice Providers on your designated Care Team:    ChMurray HodgkinsNP  RyChristell FaithPA-C  JaMarrianne MoodPA-C  CaLaurann MontanaNP  Other Instructions  Try keeping your legs up when sitting.   Recommend taking your Lasix 601mfter your infusion tomorrow.

## 2020-04-22 ENCOUNTER — Other Ambulatory Visit: Payer: Self-pay

## 2020-04-22 ENCOUNTER — Telehealth: Payer: Self-pay | Admitting: Emergency Medicine

## 2020-04-22 ENCOUNTER — Inpatient Hospital Stay: Payer: Medicare HMO

## 2020-04-22 ENCOUNTER — Observation Stay
Admission: EM | Admit: 2020-04-22 | Discharge: 2020-04-24 | Disposition: A | Discharge status: Home / Self Care | Payer: Medicare HMO | Attending: Hospitalist | Admitting: Hospitalist

## 2020-04-22 DIAGNOSIS — Z20822 Contact with and (suspected) exposure to covid-19: Secondary | ICD-10-CM | POA: Insufficient documentation

## 2020-04-22 DIAGNOSIS — Z79899 Other long term (current) drug therapy: Secondary | ICD-10-CM | POA: Insufficient documentation

## 2020-04-22 DIAGNOSIS — D649 Anemia, unspecified: Secondary | ICD-10-CM | POA: Insufficient documentation

## 2020-04-22 DIAGNOSIS — N1832 Chronic kidney disease, stage 3b: Secondary | ICD-10-CM | POA: Diagnosis not present

## 2020-04-22 DIAGNOSIS — Z7984 Long term (current) use of oral hypoglycemic drugs: Secondary | ICD-10-CM | POA: Diagnosis not present

## 2020-04-22 DIAGNOSIS — K7581 Nonalcoholic steatohepatitis (NASH): Secondary | ICD-10-CM | POA: Diagnosis not present

## 2020-04-22 DIAGNOSIS — R4182 Altered mental status, unspecified: Secondary | ICD-10-CM

## 2020-04-22 DIAGNOSIS — Z95828 Presence of other vascular implants and grafts: Secondary | ICD-10-CM

## 2020-04-22 DIAGNOSIS — K7682 Hepatic encephalopathy: Secondary | ICD-10-CM | POA: Diagnosis present

## 2020-04-22 DIAGNOSIS — I700 Atherosclerosis of aorta: Secondary | ICD-10-CM | POA: Insufficient documentation

## 2020-04-22 DIAGNOSIS — Z9689 Presence of other specified functional implants: Secondary | ICD-10-CM | POA: Diagnosis not present

## 2020-04-22 DIAGNOSIS — E876 Hypokalemia: Secondary | ICD-10-CM | POA: Diagnosis present

## 2020-04-22 DIAGNOSIS — I2510 Atherosclerotic heart disease of native coronary artery without angina pectoris: Secondary | ICD-10-CM | POA: Diagnosis not present

## 2020-04-22 DIAGNOSIS — E1122 Type 2 diabetes mellitus with diabetic chronic kidney disease: Secondary | ICD-10-CM | POA: Diagnosis not present

## 2020-04-22 DIAGNOSIS — K7460 Unspecified cirrhosis of liver: Secondary | ICD-10-CM | POA: Diagnosis not present

## 2020-04-22 DIAGNOSIS — E860 Dehydration: Secondary | ICD-10-CM | POA: Diagnosis not present

## 2020-04-22 DIAGNOSIS — I5032 Chronic diastolic (congestive) heart failure: Secondary | ICD-10-CM | POA: Diagnosis not present

## 2020-04-22 DIAGNOSIS — D510 Vitamin B12 deficiency anemia due to intrinsic factor deficiency: Secondary | ICD-10-CM | POA: Insufficient documentation

## 2020-04-22 DIAGNOSIS — K7290 Hepatic failure, unspecified without coma: Secondary | ICD-10-CM | POA: Diagnosis not present

## 2020-04-22 DIAGNOSIS — N183 Chronic kidney disease, stage 3 unspecified: Secondary | ICD-10-CM | POA: Diagnosis present

## 2020-04-22 DIAGNOSIS — R531 Weakness: Secondary | ICD-10-CM | POA: Diagnosis not present

## 2020-04-22 LAB — URINALYSIS, COMPLETE (UACMP) WITH MICROSCOPIC
Bilirubin Urine: NEGATIVE
Glucose, UA: NEGATIVE mg/dL
Hgb urine dipstick: NEGATIVE
Ketones, ur: NEGATIVE mg/dL
Nitrite: NEGATIVE
Protein, ur: NEGATIVE mg/dL
Specific Gravity, Urine: 1.006 (ref 1.005–1.030)
pH: 7 (ref 5.0–8.0)

## 2020-04-22 LAB — CBC
HCT: 31.9 % — ABNORMAL LOW (ref 36.0–46.0)
Hemoglobin: 10.1 g/dL — ABNORMAL LOW (ref 12.0–15.0)
MCH: 28.1 pg (ref 26.0–34.0)
MCHC: 31.7 g/dL (ref 30.0–36.0)
MCV: 88.9 fL (ref 80.0–100.0)
Platelets: 133 10*3/uL — ABNORMAL LOW (ref 150–400)
RBC: 3.59 MIL/uL — ABNORMAL LOW (ref 3.87–5.11)
RDW: 17.2 % — ABNORMAL HIGH (ref 11.5–15.5)
WBC: 3.7 10*3/uL — ABNORMAL LOW (ref 4.0–10.5)
nRBC: 0 % (ref 0.0–0.2)

## 2020-04-22 LAB — COMPREHENSIVE METABOLIC PANEL
ALT: 16 U/L (ref 0–44)
AST: 37 U/L (ref 15–41)
Albumin: 2.8 g/dL — ABNORMAL LOW (ref 3.5–5.0)
Alkaline Phosphatase: 120 U/L (ref 38–126)
Anion gap: 10 (ref 5–15)
BUN: 16 mg/dL (ref 8–23)
CO2: 20 mmol/L — ABNORMAL LOW (ref 22–32)
Calcium: 9.1 mg/dL (ref 8.9–10.3)
Chloride: 110 mmol/L (ref 98–111)
Creatinine, Ser: 1.65 mg/dL — ABNORMAL HIGH (ref 0.44–1.00)
GFR calc Af Amer: 37 mL/min — ABNORMAL LOW (ref >60)
GFR calc non Af Amer: 32 mL/min — ABNORMAL LOW (ref >60)
Glucose, Bld: 131 mg/dL — ABNORMAL HIGH (ref 70–99)
Potassium: 3.4 mmol/L — ABNORMAL LOW (ref 3.5–5.1)
Sodium: 140 mmol/L (ref 135–145)
Total Bilirubin: 1.6 mg/dL — ABNORMAL HIGH (ref 0.3–1.2)
Total Protein: 6.8 g/dL (ref 6.5–8.1)

## 2020-04-22 LAB — AMMONIA: Ammonia: 65 umol/L — ABNORMAL HIGH (ref 9–35)

## 2020-04-22 NOTE — ED Notes (Signed)
2 unsuccessful blood draw attempts (1 by this RN and 1 by Alma Friendly, EDT). Lattie Haw May, EDT, attempting at this time; will request lab to come draw blood work if unsuccessful.

## 2020-04-22 NOTE — ED Triage Notes (Signed)
Pt arrives to ED via POV from home with c/o AMS that started last night. Pt family reports she "was off and tired". Pt denies any c/o recent illness; no N/V/D or fever. No c/o CP or SHOB. Daughter reports pt takes Lactulose BID for "ammonia issues". Pt is alert, but pleasantly confused and cooperative.

## 2020-04-22 NOTE — Telephone Encounter (Signed)
Called patient to let her know that she did not need her scheduled blood transfusion today. She did not answer. Left message letting her know same, and instructed her to call if she had any questions or concerns.

## 2020-04-23 DIAGNOSIS — E876 Hypokalemia: Secondary | ICD-10-CM | POA: Diagnosis not present

## 2020-04-23 DIAGNOSIS — Z95828 Presence of other vascular implants and grafts: Secondary | ICD-10-CM | POA: Diagnosis not present

## 2020-04-23 DIAGNOSIS — K7290 Hepatic failure, unspecified without coma: Secondary | ICD-10-CM | POA: Diagnosis not present

## 2020-04-23 DIAGNOSIS — E860 Dehydration: Secondary | ICD-10-CM | POA: Diagnosis not present

## 2020-04-23 DIAGNOSIS — K7682 Hepatic encephalopathy: Secondary | ICD-10-CM | POA: Diagnosis present

## 2020-04-23 LAB — SARS CORONAVIRUS 2 (TAT 6-24 HRS): SARS Coronavirus 2: NEGATIVE

## 2020-04-23 LAB — HEMOGLOBIN A1C
Hgb A1c MFr Bld: 5.7 % — ABNORMAL HIGH (ref 4.8–5.6)
Mean Plasma Glucose: 116.89 mg/dL

## 2020-04-23 LAB — GLUCOSE, CAPILLARY
Glucose-Capillary: 62 mg/dL — ABNORMAL LOW (ref 70–99)
Glucose-Capillary: 58 mg/dL — ABNORMAL LOW (ref 70–99)
Glucose-Capillary: 119 mg/dL — ABNORMAL HIGH (ref 70–99)

## 2020-04-23 MED ORDER — LACTULOSE 10 GM/15ML PO SOLN
30.0000 g | Freq: Once | ORAL | Status: AC
  Administered 2020-04-23: 30 g via ORAL
  Filled 2020-04-23: qty 60

## 2020-04-23 MED ORDER — ONDANSETRON HCL 4 MG PO TABS: 4.0000 mg | ORAL_TABLET | Freq: Four times a day (QID) | ORAL | Status: DC | PRN

## 2020-04-23 MED ORDER — SODIUM CHLORIDE 0.9 % IV SOLN
INTRAVENOUS | Status: DC
  Administered 2020-04-23: 100 mL/h via INTRAVENOUS

## 2020-04-23 MED ORDER — HYDROXYZINE HCL 10 MG PO TABS
10.0000 mg | ORAL_TABLET | Freq: Three times a day (TID) | ORAL | Status: DC | PRN
  Filled 2020-04-23: qty 1

## 2020-04-23 MED ORDER — POTASSIUM CHLORIDE CRYS ER 20 MEQ PO TBCR
40.0000 meq | EXTENDED_RELEASE_TABLET | Freq: Once | ORAL | Status: AC
  Administered 2020-04-23: 40 meq via ORAL
  Filled 2020-04-23: qty 2

## 2020-04-23 MED ORDER — TRAMADOL HCL 50 MG PO TABS
50.0000 mg | ORAL_TABLET | Freq: Once | ORAL | Status: AC
  Administered 2020-04-23: 50 mg via ORAL
  Filled 2020-04-23: qty 1

## 2020-04-23 MED ORDER — FUROSEMIDE 40 MG PO TABS
40.0000 mg | ORAL_TABLET | Freq: Every day | ORAL | Status: DC
  Administered 2020-04-23 – 2020-04-24 (×2): 40 mg via ORAL
  Filled 2020-04-23 (×2): qty 1

## 2020-04-23 MED ORDER — RIFAXIMIN 550 MG PO TABS
550.0000 mg | ORAL_TABLET | Freq: Two times a day (BID) | ORAL | Status: DC
  Administered 2020-04-23 – 2020-04-24 (×3): 550 mg via ORAL
  Filled 2020-04-23 (×4): qty 1

## 2020-04-23 MED ORDER — ONDANSETRON HCL 4 MG/2ML IJ SOLN: 4.0000 mg | Freq: Four times a day (QID) | INTRAMUSCULAR | Status: DC | PRN

## 2020-04-23 MED ORDER — LACTULOSE 10 GM/15ML PO SOLN: 30.0000 g | Freq: Two times a day (BID) | ORAL | Status: DC

## 2020-04-23 MED ORDER — LACTULOSE 10 GM/15ML PO SOLN
30.0000 g | Freq: Three times a day (TID) | ORAL | Status: DC
  Administered 2020-04-23 – 2020-04-24 (×4): 30 g via ORAL
  Filled 2020-04-23 (×4): qty 60

## 2020-04-23 MED ORDER — SPIRONOLACTONE 25 MG PO TABS
50.0000 mg | ORAL_TABLET | Freq: Every day | ORAL | Status: DC
  Administered 2020-04-23 – 2020-04-24 (×2): 50 mg via ORAL
  Filled 2020-04-23 (×2): qty 2

## 2020-04-23 MED ORDER — INSULIN ASPART 100 UNIT/ML ~~LOC~~ SOLN
0.0000 [IU] | Freq: Three times a day (TID) | SUBCUTANEOUS | Status: DC
  Administered 2020-04-24: 2 [IU] via SUBCUTANEOUS
  Filled 2020-04-23: qty 1

## 2020-04-23 MED ORDER — PANTOPRAZOLE SODIUM 40 MG PO TBEC
40.0000 mg | DELAYED_RELEASE_TABLET | Freq: Two times a day (BID) | ORAL | Status: DC
  Administered 2020-04-23 – 2020-04-24 (×3): 40 mg via ORAL
  Filled 2020-04-23 (×3): qty 1

## 2020-04-23 NOTE — H&P (Signed)
History and Physical    Crystal [name] EUM:[id number] DOB: 1951/06/15 DOA: 04/22/2020  PCP: Lorelee Market, MD   Patient coming from: Home  I have personally briefly reviewed patient's old medical records in [place]  Chief Complaint: Confusion  HPI: Crystal [name] is a 69 y.o. 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  Patient's daughter who was present during her initial evaluation said patient had seemed " loopy" throughout the day and appeared to be more tired than usual.  She was concerned that her ammonia level could be high again which has happened in the past and so gave the patient 2 doses of lactulose.  She was also concerned that patient had not had a bowel movement since after receiving the lactulose and that she still was not acting right so she brought her to the emergency room for evaluation. Patient states that she has been compliant with her lactulose but notes that she has not been having frequent bowel movements.  She states that she is more sluggish than normal and has had a hard time dialing numbers on her phone which is something she is able to do easily. She denies having any fever or chills, she denies having any abdominal pain, she denies having any urinary symptoms, she denies having any nausea vomiting. Labs reveal an elevated ammonia level She will be referred to observation status for further evaluation  ED Course: Patient is a 69 year old female who was brought into the emergency room by her daughter because of mental status changes and concerns for hepatic encephalopathy.  Patient has a history of liver cirrhosis, and family states that she gets this way when her ammonia level is elevated Labs reveal elevated ammonia levels.  Patient received a dose of lactulose in the ER  without having a bowel movement and remains confused.  She will be referred to observation status  Review of Systems: As per HPI otherwise 10 point review of systems negative.    Past Medical History:  Diagnosis Date  . Acute GI bleeding 02/09/2018  . Acute upper gastrointestinal bleeding 08/03/2018  . Anemia    TAKES IRON TAB  . Arthritis    SHOULDER  . B12 deficiency 02/18/2018  . Bleeding    GI  3/19  . Bronchitis    HX OF  . Cirrhosis ([place])   . Colon cancer screening   . Diabetes mellitus without complication ([place])    TYPE 2  . Fatty (change of) liver, not elsewhere classified   . Full dentures    UPPER AND LOWER  . Iron deficiency anemia due to chronic blood loss 02/18/2018  . Lung nodule, multiple   . Pernicious anemia 02/22/2018  . PUD (peptic ulcer disease)   . Raynaud's syndrome   . Upper GI bleeding 08/03/2018    Past Surgical History:  Procedure Laterality Date  . CATARACT EXTRACTION W/PHACO Right 02/06/2018   Procedure: CATARACT EXTRACTION PHACO AND INTRAOCULAR LENS PLACEMENT ([place]) RIGHT DIABETIC;  Surgeon: Leandrew Koyanagi, MD;  Location: [place];  Service: Ophthalmology;  Laterality: Right;  DIABETIC, ORAL MED  . CATARACT EXTRACTION W/PHACO Left 04/30/2018   Procedure: CATARACT EXTRACTION PHACO AND INTRAOCULAR LENS PLACEMENT (I[facility]);  Surgeon: Leandrew Koyanagi, MD;  Location: [facility];  Service: Ophthalmology;  Laterality: Left;  [place] 01:04 AP% 21.3 CDE 13.66 Fluid pack  lot # [id number]  . COLONOSCOPY WITH PROPOFOL N/A 03/25/2018   Procedure: COLONOSCOPY WITH PROPOFOL;  Surgeon: Lin Landsman, MD;  Location[facility]COPY;  Service: Gastroenterology;  Laterality: N/A;  . DENTAL SURGERY     EXTRACTIONS  . ELECTROMAGNETIC NAVIGATION BROCHOSCOPY Right 01/27/2019   Procedure: ELECTROMAGNETIC NAVIGATION BRONCHOSCOPY;  Surgeon: [name], MD;  Location: [facility];  Service: Cardiopulmonary;  Laterality: Right;  . ESOPHAGOGASTRODUODENOSCOPY N/A  08/03/2018   Procedure: ESOPHAGOGASTRODUODENOSCOPY (EGD);  Surgeon: Lin Landsman, MD;  Location: [facility] ENDOSCOPY;  Service: Gastroenterology;  Laterality: N/A;  . ESOPHAGOGASTRODUODENOSCOPY (EGD) WITH PROPOFOL N/A 02/10/2018   Procedure: ESOPHAGOGASTRODUODENOSCOPY (EGD) WITH PROPOFOL;  Surgeon: Jonathon Bellows, MD;  Location: [facility] ENDOSCOPY;  Service: Gastroenterology;  Laterality: N/A;  . ESOPHAGOGASTRODUODENOSCOPY (EGD) WITH PROPOFOL N/A 03/25/2018   Procedure: ESOPHAGOGASTRODUODENOSCOPY (EGD) WITH PROPOFOL;  Surgeon: Lin Landsman, MD;  Location: [facility] ENDOSCOPY;  Service: Gastroenterology;  Laterality: N/A;  . ESOPHAGOGASTRODUODENOSCOPY (EGD) WITH PROPOFOL N/A 04/22/2018   Procedure: ESOPHAGOGASTRODUODENOSCOPY (EGD) WITH PROPOFOL with band ligation;  Surgeon: Lin Landsman, MD;  Location: [place];  Service: Gastroenterology;  Laterality: N/A;  . ESOPHAGOGASTRODUODENOSCOPY (EGD) WITH PROPOFOL N/A 06/24/2018   Procedure: ESOPHAGOGASTRODUODENOSCOPY (EGD) WITH PROPOFOL;  Surgeon: Lin Landsman, MD;  Location: [facility] ENDOSCOPY;  Service: Gastroenterology;  Laterality: N/A;  . ESOPHAGOGASTRODUODENOSCOPY (EGD) WITH PROPOFOL N/A 11/19/2018   Procedure: ESOPHAGOGASTRODUODENOSCOPY (EGD) WITH PROPOFOL;  Surgeon: Lin Landsman, MD;  Location: [facility] ENDOSCOPY;  Service: Gastroenterology;  Laterality: N/A;  . ESOPHAGOGASTRODUODENOSCOPY (EGD) WITH PROPOFOL N/A 03/08/2020   Procedure: ESOPHAGOGASTRODUODENOSCOPY (EGD) WITH PROPOFOL;  Surgeon: Lin Landsman, MD;  Location: [facility] ENDOSCOPY;  Service: Gastroenterology;  Laterality: N/A;  . EYE SURGERY    . GIVENS CAPSULE STUDY N/A 03/29/2020   Procedure: GIVENS CAPSULE STUDY;  Surgeon: Lin Landsman, MD;  Location: [facility] ENDOSCOPY;  Service: Gastroenterology;  Laterality: N/A;  . HEMORRHOID BANDING  03/25/2018   Procedure: HEMORRHOID BANDING;  Surgeon: Lin Landsman, MD;  Location: [facility] ENDOSCOPY;  Service: Gastroenterology;;  .  IR EMBO ART  VEN HEMORR LYMPH EXTRAV  INC GUIDE ROADMAPPING  08/07/2018  . IR RADIOLOGIST EVAL & MGMT  09/03/2018  . IR RADIOLOGIST EVAL & MGMT  12/17/2018  . IR RADIOLOGIST EVAL & MGMT  06/24/2019  . IR RADIOLOGIST EVAL & MGMT  01/22/2020  . IR TIPS  08/07/2018  . RADIOLOGY WITH ANESTHESIA N/A 08/07/2018   Procedure: TIPS;  Surgeon: Corrie Mckusick, DO;  Location: [place];  Service: Anesthesiology;  Laterality: N/A;  . TONSILLECTOMY       reports that she has never smoked. She has never used smokeless tobacco. She reports previous alcohol use. She reports that she does not use drugs.  No Known Allergies  Family History  Problem Relation Age of Onset  . CAD Father   . Heart attack Father 53  . Cancer Maternal Uncle   . Seizures Mother      Prior to Admission medications   Medication Sig Start Date End Date Taking? Authorizing Provider  cyanocobalamin (,VITAMIN B-12,) 1000 MCG/ML injection Inject 1,000 mcg into the muscle every 30 (thirty) days.    Yes [provider]  hydrOXYzine (ATARAX/VISTARIL) 10 MG tablet TAKE 1 TABLET (10 MG TOTAL) BY MOUTH 3 (THREE) TIMES DAILY AS NEEDED FOR ITCHING. 11/03/19  Yes Vanga, Tally Due, MD  lactulose (CHRONULAC) 10 GM/15ML solution Take 45 mLs (30 g total) by mouth 2 (two) times daily. 04/05/20 05/05/20 Yes Vanga, Tally Due, MD  metFORMIN (GLUCOPHAGE) 500 MG tablet  Take 1,000 mg by mouth 2 (two) times daily with a meal.    Yes [provider]  nystatin (MYCOSTATIN/NYSTOP) powder Apply 1 g topically as needed (rash).  05/23/18  Yes [provider]  ondansetron (ZOFRAN) 4 MG tablet ondansetron HCl 4 mg tablet  TAKE 1 TABLET BY MOUTH EVERY 6 HOURS AS NEEDED   Yes [provider]  pantoprazole (PROTONIX) 40 MG tablet Take 40 mg by mouth 2 (two) times daily. 06/29/19  Yes [provider]  potassium chloride (KLOR-CON) 10 MEQ tablet Take 1.5 tablet daily for one week. After one week, take 1 tablet and 1.5 tablet on  alternating days. (1 tablet on 56m Lasix days and 1.5 tablet on 641mLasix days). 04/21/20  Yes WaLoel DubonnetNP  rifaximin (XIFAXAN) 550 MG TABS tablet TAKE 1 TABLET (550 MG TOTAL) BY MOUTH 2 (TWO) TIMES DAILY. 10/20/19  Yes Vanga, RoTally DueMD  spironolactone (ALDACTONE) 50 MG tablet TAKE 1 TABLET BY MOUTH EVERY DAY 02/27/20  Yes Vanga, RoTally DueMD  furosemide (LASIX) 40 MG tablet Take 6019m1.5 tablet) daily for one week. After one week, take 1 tablet (47m49mnd 1.5 tablet (60mg2m alternating days. Patient not taking: Reported on 04/23/2020 04/21/20   WalkeLoel Dubonnet Misc. Devices KIT Moderate compression hose 20-30 MM HG 10/14/19   Vanga, RohinTally Due OneTouch Delica Lancets 33G M62G 2 (two) times daily. 12/01/19   [provider]  ONETOSeattle Children'S HospitalA test strip 2 (two) times daily. 11/23/19   [provider]    Physical Exam: Vitals:   04/23/20 0723 04/23/20 0724 04/23/20 0726 04/23/20 0727  BP:      Pulse: 85 85 76 75  Resp: 19 (!) _0 Temp:      TempSrc:      SpO2: 96% 97% 100% 100%  Weight:      Height:         Vitals:   04/23/20 0723 04/23/20 0724 04/23/20 0726 04/23/20 0727  BP:      Pulse: 85 85 76 75  Resp: 19 (!) _1 Temp:      TempSrc:      SpO2: 96% 97% 100% 100%  Weight:      Height:        Constitutional: NAD, alert and oriented x 2 Eyes: PERRL, lids and conjunctivae normal ENMT: Mucous membranes are moist.  Neck: normal, supple, no masses, no thyromegaly Respiratory: clear to auscultation bilaterally, no wheezing, no crackles. Normal respiratory effort. No accessory muscle use.  Cardiovascular: Regular rate and rhythm, no murmurs / rubs / gallops. No extremity edema. 2+ pedal pulses. No carotid bruits.  Abdomen: no tenderness, no masses palpated. No hepatosplenomegaly. Bowel sounds positive.  Musculoskeletal: no clubbing / cyanosis. No joint deformity upper and lower extremities.  Skin: no rashes, lesions,  ulcers.  Neurologic: No gross focal neurologic deficit. Psychiatric: Normal mood and affect.   Labs on Admission: I have personally reviewed following labs and imaging studies  CBC: Recent Labs  Lab 04/20/20 1322 04/22/20 1951  WBC 3.7* 3.7*  HGB 9.8* 10.1*  HCT 31.4* 31.9*  MCV 90.2 88.9  PLT 125* 133* 315asic Metabolic Panel: Recent Labs  Lab 04/22/20 1951  NA 140  K 3.4*  CL 110  CO2 20*  GLUCOSE 131*  BUN 16  CREATININE 1.65*  CALCIUM 9.1   GFR: Estimated Creatinine Clearance: 28.2 mL/min (A) (by C-G formula based on  SCr of 1.65 mg/dL (H)). Liver Function Tests: Recent Labs  Lab 04/22/20 1951  AST 37  ALT 16  ALKPHOS 120  BILITOT 1.6*  PROT 6.8  ALBUMIN 2.8*   No results for input(s): LIPASE, AMYLASE in the last 168 hours. Recent Labs  Lab 04/22/20 1951  AMMONIA 65*   Coagulation Profile: No results for input(s): INR, PROTIME in the last 168 hours. Cardiac Enzymes: No results for input(s): CKTOTAL, CKMB, CKMBINDEX, TROPONINI in the last 168 hours. BNP (last 3 results) No results for input(s): PROBNP in the last 8760 hours. HbA1C: No results for input(s): HGBA1C in the last 72 hours. CBG: No results for input(s): GLUCAP in the last 168 hours. Lipid Profile: No results for input(s): CHOL, HDL, LDLCALC, TRIG, CHOLHDL, LDLDIRECT in the last 72 hours. Thyroid Function Tests: Recent Labs    04/20/20 1322  TSH 2.880  T4TOTAL 7.4   Anemia Panel: Recent Labs    04/20/20 1322  FOLATE 19.2  RETICCTPCT 2.5   Urine analysis:    Component Value Date/Time   COLORURINE YELLOW (A) 04/22/2020 1951   APPEARANCEUR CLEAR (A) 04/22/2020 1951   LABSPEC 1.006 04/22/2020 1951   PHURINE 7.0 04/22/2020 1951   GLUCOSEU NEGATIVE 04/22/2020 1951   HGBUR NEGATIVE 04/22/2020 1951   BILIRUBINUR NEGATIVE 04/22/2020 1951   KETONESUR NEGATIVE 04/22/2020 1951   PROTEINUR NEGATIVE 04/22/2020 1951   NITRITE NEGATIVE 04/22/2020 1951   LEUKOCYTESUR SMALL (A)  04/22/2020 1951    Radiological Exams on Admission: No results found.  EKG: Independently reviewed.   Assessment/Plan Principal Problem:   Hepatic encephalopathy (HCC) Active Problems:   Type 2 diabetes mellitus with stage 3 chronic kidney disease (HCC)   S/P TIPS (transjugular intrahepatic portosystemic shunt)   Hypokalemia   Dehydration    Hepatic encephalopathy Patient has a known history of liver cirrhosis from [place] and has had episodes of hepatic encephalopathy in the past She was brought into the emergency room by family because she was more sluggish than normal and not acting right Ammonia level is elevated at 65 Continue lactulose and titrate for 2-3 soft stools Continue rifaximin    History of liver cirrhosis Secondary to NASH Continue Lasix and spironolactone Will add low-dose propranolol   Dehydration At baseline patient has a serum creatinine of 1.35 and today on admission it was 1.65 We will hydrate patient Repeat renal parameters in a.m.   Hypokalemia Supplement potassium   Diabetes mellitus with complications of stage III chronic kidney disease Maintain consistent carbohydrate diet/low-sodium Sliding scale coverage with insulin   DVT prophylaxis: SCD Code Status: Full Family Communication: Greater than 50% of time was spent discussing plan of care with patient and her friend at the bedside.  All questions and concerns have been addressed.  She verbalizes understanding and agrees with the plan. Disposition Plan: Back to previous home environment Consults called: None    Whitnee Orzel MD [facility]     04/23/2020, 9:22 AM

## 2020-04-23 NOTE — ED Provider Notes (Signed)
[facility] Emergency Department Provider Note   ____________________________________________   First MD Initiated Contact with Patient 04/23/20 0000     (approximate)  I have reviewed the triage vital signs and the nursing notes.   HISTORY  Chief Complaint Altered Mental Status    HPI Crystal [name] is a 69 y.o. female with past medical history of Karlene Lineman cirrhosis, diabetes, CHF, peptic ulcer disease, and esophageal varices status post TIPS who presents to the ED for altered mental status.  Patient's daughter states that she has seemed "loopy" throughout the day today and more tired than usual.  She was concerned that her ammonia level could be high again and so gave the patient 2 doses of lactulose this evening.  She has not had a bowel movement since then and has not had any real improvement in her mental status.  Daughter is concerned that patient may have missed her dose of lactulose last night and this morning as the patient does not remember whether she took them.  Patient denies any fevers, abdominal distention, abdominal pain, melena, or bloody stool.  She has otherwise been doing well per daughter.        Past Medical History:  Diagnosis Date  . Acute GI bleeding 02/09/2018  . Acute upper gastrointestinal bleeding 08/03/2018  . Anemia    TAKES IRON TAB  . Arthritis    SHOULDER  . B12 deficiency 02/18/2018  . Bleeding    GI  3/19  . Bronchitis    HX OF  . Cirrhosis ([place])   . Colon cancer screening   . Diabetes mellitus without complication ([place])    TYPE 2  . Fatty (change of) liver, not elsewhere classified   . Full dentures    UPPER AND LOWER  . Iron deficiency anemia due to chronic blood loss 02/18/2018  . Lung nodule, multiple   . Pernicious anemia 02/22/2018  . PUD (peptic ulcer disease)   . Raynaud's syndrome   . Upper GI bleeding 08/03/2018    Patient Active Problem List   Diagnosis Date Noted  . Left ovarian cyst 10/06/2019  .  Hypokalemia 08/21/2019  . AMS (altered mental status) 08/20/2019  . Acute osteomyelitis of phalanx of hand ([place]) 06/12/2019  . Pain in finger of right hand 06/04/2019  . Type 2 diabetes mellitus without complication, without long-term current use of insulin ([place]) 04/02/2019  . Aortic atherosclerosis ([place]) 04/02/2019  . Coronary artery disease involving native coronary artery of native heart without angina pectoris 04/02/2019  . Chronic heart failure with preserved ejection fraction ([place]) 04/02/2019  . Lung nodules   . Volume overload 12/11/2018  . GAVE (gastric antral vascular ectasia)   . 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 Diabetes ([place]) 08/03/2018  . Hx of esophageal varices   . Cirrhosis of liver with ascites ([place])   . AVM (arteriovenous malformation) of small bowel, acquired   . Adnexal cyst 03/16/2018  . Goals of care, counseling/discussion 03/16/2018  . Right middle lobe pulmonary nodule 03/14/2018  . Right lower lobe pulmonary nodule 02/18/2018  . Anemia due to chronic blood loss 02/18/2018  . B12 deficiency 02/18/2018  . 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 Hepatitis C virus infection without hepatic coma 02/18/2018    Past Surgical History:  Procedure Laterality Date  . CATARACT EXTRACTION W/PHACO Right 02/06/2018   Procedure: CATARACT EXTRACTION PHACO AND INTRAOCULAR LENS PLACEMENT ([place]) RIGHT DIABETIC;  Surgeon: Leandrew Koyanagi, MD;  Location: [place];  Service: Ophthalmology;  Laterality: Right;  DIABETIC, ORAL MED  . CATARACT EXTRACTION W/PHACO Left 04/30/2018   Procedure: CATARACT EXTRACTION PHACO AND INTRAOCULAR LENS PLACEMENT (IOC);  Surgeon: Leandrew Koyanagi, MD;  Location: [facility];  Service: Ophthalmology;  Laterality: Left;  [place] 01:04 AP% 21.3 CDE 13.66 Fluid pack lot # [id number]  . COLONOSCOPY WITH PROPOFOL N/A 03/25/2018   Procedure: COLONOSCOPY WITH PROPOFOL;  Surgeon: Lin Landsman, MD;   Location: [facility] ENDOSCOPY;  Service: Gastroenterology;  Laterality: N/A;  . DENTAL SURGERY     EXTRACTIONS  . ELECTROMAGNETIC NAVIGATION BROCHOSCOPY Right 01/27/2019   Procedure: ELECTROMAGNETIC NAVIGATION BRONCHOSCOPY;  Surgeon: [name], MD;  Location: [facility];  Service: Cardiopulmonary;  Laterality: Right;  . ESOPHAGOGASTRODUODENOSCOPY N/A 08/03/2018   Procedure: ESOPHAGOGASTRODUODENOSCOPY (EGD);  Surgeon: Lin Landsman, MD;  Location: [facility] ENDOSCOPY;  Service: Gastroenterology;  Laterality: N/A;  . ESOPHAGOGASTRODUODENOSCOPY (EGD) WITH PROPOFOL N/A 02/10/2018   Procedure: ESOPHAGOGASTRODUODENOSCOPY (EGD) WITH PROPOFOL;  Surgeon: Jonathon Bellows, MD;  Location: [facility] ENDOSCOPY;  Service: Gastroenterology;  Laterality: N/A;  . ESOPHAGOGASTRODUODENOSCOPY (EGD) WITH PROPOFOL N/A 03/25/2018   Procedure: ESOPHAGOGASTRODUODENOSCOPY (EGD) WITH PROPOFOL;  Surgeon: Lin Landsman, MD;  Location: [facility]  ENDOSCOPY;  Service: Gastroenterology;  Laterality: N/A;  . ESOPHAGOGASTRODUODENOSCOPY (EGD) WITH PROPOFOL N/A 04/22/2018   Procedure: ESOPHAGOGASTRODUODENOSCOPY (EGD) WITH PROPOFOL with band ligation;  Surgeon: Lin Landsman, MD;  Location: [place];  Service: Gastroenterology;  Laterality: N/A;  . ESOPHAGOGASTRODUODENOSCOPY (EGD) WITH PROPOFOL N/A 06/24/2018   Procedure: ESOPHAGOGASTRODUODENOSCOPY (EGD) WITH PROPOFOL;  Surgeon: Lin Landsman, MD;  Location: [facility] ENDOSCOPY;  Service: Gastroenterology;  Laterality: N/A;  . ESOPHAGOGASTRODUODENOSCOPY (EGD) WITH PROPOFOL N/A 11/19/2018   Procedure: ESOPHAGOGASTRODUODENOSCOPY (EGD) WITH PROPOFOL;  Surgeon: Lin Landsman, MD;  Location: [facility] ENDOSCOPY;  Service: Gastroenterology;  Laterality: N/A;  . ESOPHAGOGASTRODUODENOSCOPY (EGD) WITH PROPOFOL N/A 03/08/2020   Procedure: ESOPHAGOGASTRODUODENOSCOPY (EGD) WITH PROPOFOL;  Surgeon: Lin Landsman, MD;  Location: [facility] ENDOSCOPY;  Service: Gastroenterology;  Laterality: N/A;  . EYE  SURGERY    . GIVENS CAPSULE STUDY N/A 03/29/2020   Procedure: GIVENS CAPSULE STUDY;  Surgeon: Lin Landsman, MD;  Location: [facility] ENDOSCOPY;  Service: Gastroenterology;  Laterality: N/A;  . HEMORRHOID BANDING  03/25/2018   Procedure: HEMORRHOID BANDING;  Surgeon: Lin Landsman, MD;  Location: [facility] ENDOSCOPY;  Service: Gastroenterology;;  . IR EMBO ART  VEN HEMORR LYMPH EXTRAV  INC GUIDE ROADMAPPING  08/07/2018  . IR RADIOLOGIST EVAL & MGMT  09/03/2018  . IR RADIOLOGIST EVAL & MGMT  12/17/2018  . IR RADIOLOGIST EVAL & MGMT  06/24/2019  . IR RADIOLOGIST EVAL & MGMT  01/22/2020  . IR TIPS  08/07/2018  . RADIOLOGY WITH ANESTHESIA N/A 08/07/2018   Procedure: TIPS;  Surgeon: Corrie Mckusick, DO;  Location: [place];  Service: Anesthesiology;  Laterality: N/A;  . TONSILLECTOMY      Prior to Admission medications   Medication Sig Start Date End Date Taking? Authorizing Provider  cyanocobalamin (,VITAMIN B-12,) 1000 MCG/ML injection Inject 1,000 mcg into the muscle every 30 (thirty) days.     [provider]  furosemide (LASIX) 40 MG tablet Take 15m (1.5 tablet) daily for one week. After one week, take 1 tablet (453m and 1.5 tablet (6075mon alternating days. 04/21/20   WalLoel DubonnetP  hydrOXYzine (ATARAX/VISTARIL) 10 MG tablet TAKE 1 TABLET (10 MG TOTAL) BY MOUTH 3 (THREE) TIMES DAILY AS NEEDED FOR ITCHING. 11/03/19   VanLin LandsmanD  lactulose (CHRONULAC) 10 GM/15ML solution Take 45 mLs (30  g total) by mouth 2 (two) times daily. 04/05/20 05/05/20  Lin Landsman, MD  metFORMIN (GLUCOPHAGE) 500 MG tablet Take 1,000 mg by mouth 2 (two) times daily with a meal.     [provider]  Misc. Devices KIT Moderate compression hose 20-30 MM HG 10/14/19   Vanga, Tally Due, MD  nystatin (MYCOSTATIN/NYSTOP) powder Apply 1 g topically as needed (rash).  05/23/18   [provider]  ondansetron (ZOFRAN) 4 MG tablet ondansetron HCl 4 mg tablet  TAKE 1 TABLET BY MOUTH  EVERY 6 HOURS AS NEEDED    [provider]  OneTouch Delica Lancets 24M MISC 2 (two) times daily. 12/01/19   [provider]  [facility] ULTRA test strip 2 (two) times daily. 11/23/19   [provider]  pantoprazole (PROTONIX) 40 MG tablet Take 40 mg by mouth 2 (two) times daily. 06/29/19   [provider]  potassium chloride (KLOR-CON) 10 MEQ tablet Take 1.5 tablet daily for one week. After one week, take 1 tablet and 1.5 tablet on alternating days. (1 tablet on 37m Lasix days and 1.5 tablet on 625mLasix days). 04/21/20   WaLoel DubonnetNP  rifaximin (XIFAXAN) 550 MG TABS tablet TAKE 1 TABLET (550 MG TOTAL) BY MOUTH 2 (TWO) TIMES DAILY. 10/20/19   VaLin LandsmanMD  spironolactone (ALDACTONE) 50 MG tablet TAKE 1 TABLET BY MOUTH EVERY DAY 02/27/20   VaLin LandsmanMD    Allergies Patient has no known allergies.  Family History  Problem Relation Age of Onset  . CAD Father   . Heart attack Father 4051. Cancer Maternal Uncle   . Seizures Mother     Social History Social History   Tobacco Use  . Smoking status: Never Smoker  . Smokeless tobacco: Never Used  Vaping Use  . Vaping Use: Never used  Substance Use Topics  . Alcohol use: Not Currently    Comment: No EtOH for 30 years.  Never a heavy drinker.  . Drug use: Never    Review of Systems  Constitutional: No fever/chills.  Positive for fatigue and confusion. Eyes: No visual changes. ENT: No sore throat. Cardiovascular: Denies chest pain. Respiratory: Denies shortness of breath. Gastrointestinal: No abdominal pain.  No nausea, no vomiting.  No diarrhea.  No constipation. Genitourinary: Negative for dysuria. Musculoskeletal: Negative for back pain. Skin: Negative for rash. Neurological: Negative for headaches, focal weakness or numbness.  ____________________________________________   PHYSICAL EXAM:  VITAL SIGNS: ED Triage Vitals [04/22/20 1936]  Enc Vitals Group     BP  (!) 141/59     Pulse Rate 80     Resp 16     Temp 98.6 F (37 C)     Temp Source Oral     SpO2 100 %     Weight 140 lb (63.5 kg)     Height 5' 4" (1.626 m)     Head Circumference      Peak Flow      Pain Score 0     Pain Loc      Pain Edu?      Excl. in [place]    Constitutional: Alert and oriented to person and place, but not time. Eyes: Conjunctivae are normal. Head: Atraumatic. Nose: No congestion/rhinnorhea. Mouth/Throat: Mucous membranes are moist. Neck: Normal ROM Cardiovascular: Normal rate, regular rhythm. Grossly normal heart sounds. Respiratory: Normal respiratory effort.  No retractions. Lungs CTAB. Gastrointestinal: Soft and nontender. No distention. Genitourinary: deferred Musculoskeletal: No  lower extremity tenderness nor edema. Neurologic:  Normal speech and language. No gross focal neurologic deficits are appreciated. Skin:  Skin is warm, dry and intact. No rash noted. Psychiatric: Mood and affect are normal. Speech and behavior are normal.  ____________________________________________   LABS (all labs ordered are listed, but only abnormal results are displayed)  Labs Reviewed  COMPREHENSIVE METABOLIC PANEL - Abnormal; Notable for the following components:      Result Value   Potassium 3.4 (*)    CO2 20 (*)    Glucose, Bld 131 (*)    Creatinine, Ser 1.65 (*)    Albumin 2.8 (*)    Total Bilirubin 1.6 (*)    GFR calc non Af Amer 32 (*)    GFR calc Af Amer 37 (*)    All other components within normal limits  CBC - Abnormal; Notable for the following components:   WBC 3.7 (*)    RBC 3.59 (*)    Hemoglobin 10.1 (*)    HCT 31.9 (*)    RDW 17.2 (*)    Platelets 133 (*)    All other components within normal limits  URINALYSIS, COMPLETE (UACMP) WITH MICROSCOPIC - Abnormal; Notable for the following components:   Color, Urine YELLOW (*)    APPearance CLEAR (*)    Leukocytes,Ua SMALL (*)    Bacteria, UA RARE (*)    All other components within normal  limits  AMMONIA - Abnormal; Notable for the following components:   Ammonia 65 (*)    All other components within normal limits  SARS CORONAVIRUS 2 (TAT 6-24 HRS)  CBG MONITORING, ED     PROCEDURES  Procedure(s) performed (including Critical Care):  Procedures   ____________________________________________   INITIAL IMPRESSION / ASSESSMENT AND PLAN / ED COURSE       69 year old female with history of Karlene Lineman cirrhosis, TIPS, diabetes, CHF, and PUD presents to the ED for increased confusion and weakness over the past 24 hours.  This episode has seemed similar to patient's prior episodes of hepatic encephalopathy, but daughter has given lactulose with no improvement.  Lab work is remarkable for elevated ammonia, but otherwise shows stable hemoglobin and renal function as well as liver function.  UA shows no evidence of infection and patient denies any respiratory symptoms to suggest pulmonary infection.  No focal neurologic deficits to suggest stroke or other intracranial process.  As patient has not yet had a bowel movement this evening, we will give an additional dose of lactulose and reevaluate.  Patient has not had a bowel movement following dose of lactulose and continues to be confused.  Given this, case was discussed with hospitalist for admission.      ____________________________________________   FINAL CLINICAL IMPRESSION(S) / ED DIAGNOSES  Final diagnoses:  Hepatic encephalopathy ([place])  Altered mental status, unspecified altered mental status type     ED Discharge Orders    None       Note:  This document was prepared using Dragon voice recognition software and may include unintentional dictation errors.   Blake Divine, MD 04/23/20 [phone]

## 2020-04-24 DIAGNOSIS — K7290 Hepatic failure, unspecified without coma: Secondary | ICD-10-CM | POA: Diagnosis not present

## 2020-04-24 LAB — BASIC METABOLIC PANEL
Anion gap: 8 (ref 5–15)
BUN: 14 mg/dL (ref 8–23)
CO2: 20 mmol/L — ABNORMAL LOW (ref 22–32)
Calcium: 8.6 mg/dL — ABNORMAL LOW (ref 8.9–10.3)
Chloride: 112 mmol/L — ABNORMAL HIGH (ref 98–111)
Creatinine, Ser: 1.39 mg/dL — ABNORMAL HIGH (ref 0.44–1.00)
GFR calc Af Amer: 45 mL/min — ABNORMAL LOW (ref >60)
GFR calc non Af Amer: 39 mL/min — ABNORMAL LOW (ref >60)
Glucose, Bld: 125 mg/dL — ABNORMAL HIGH (ref 70–99)
Potassium: 3.5 mmol/L (ref 3.5–5.1)
Sodium: 140 mmol/L (ref 135–145)

## 2020-04-24 LAB — CBC
HCT: 24.3 % — ABNORMAL LOW (ref 36.0–46.0)
Hemoglobin: 8.1 g/dL — ABNORMAL LOW (ref 12.0–15.0)
MCH: 28.9 pg (ref 26.0–34.0)
MCHC: 33.3 g/dL (ref 30.0–36.0)
MCV: 86.8 fL (ref 80.0–100.0)
Platelets: 105 10*3/uL — ABNORMAL LOW (ref 150–400)
RBC: 2.8 MIL/uL — ABNORMAL LOW (ref 3.87–5.11)
RDW: 17.2 % — ABNORMAL HIGH (ref 11.5–15.5)
WBC: 3.1 10*3/uL — ABNORMAL LOW (ref 4.0–10.5)
nRBC: 0 % (ref 0.0–0.2)

## 2020-04-24 LAB — GLUCOSE, CAPILLARY
Glucose-Capillary: 108 mg/dL — ABNORMAL HIGH (ref 70–99)
Glucose-Capillary: 140 mg/dL — ABNORMAL HIGH (ref 70–99)

## 2020-04-24 MED ORDER — FUROSEMIDE 40 MG PO TABS: ORAL_TABLET | ORAL | 2 refills | Status: DC

## 2020-04-24 MED ORDER — LACTULOSE 10 GM/15ML PO SOLN: 30.0000 g | Freq: Three times a day (TID) | ORAL | 0 refills | Status: AC

## 2020-04-24 MED ORDER — POTASSIUM CHLORIDE CRYS ER 10 MEQ PO TBCR: 10.0000 meq | EXTENDED_RELEASE_TABLET | Freq: Every day | ORAL | 3 refills | Status: DC

## 2020-04-24 MED ORDER — LACTULOSE 10 GM/15ML PO SOLN
30.0000 g | ORAL | Status: DC
  Administered 2020-04-24: 30 g via ORAL
  Filled 2020-04-24: qty 60

## 2020-04-24 NOTE — Progress Notes (Signed)
Patient is being discharged to home this afternoon. Husband is bedside to take home. DC & Rx instructions given, questions answered and patient acknowledged understanding. IV's removed.

## 2020-04-24 NOTE — Discharge Summary (Signed)
Physician Discharge Summary   [place]  female DOB: 10/18/1951  YTK:[id number]  PCP: Lorelee Market, MD  Admit date: 04/22/2020 Discharge date: 04/24/2020  Admitted From: home Disposition:  home CODE STATUS: Full code  Discharge Instructions    Discharge instructions   Complete by: As directed    I have increased your lactulose to 3 times per day (up from 2 times per day).  I have also discontinued your metformin because you have episodes of very low blood sugar, and your A1c is low already at 5.7.   Dr. Enzo Bi [employer] [facility] Course:  For full details, please see H&P, progress notes, consult notes and ancillary notes.  Briefly,  Crystal [name] is a 69 y.o. 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    Patient's daughter who was present during her initial evaluation said patient had seemed "loopy" throughout the day and appeared to be more tired than usual.  She was concerned that her ammonia level could be high again which had happened in the past and so gave the patient 2 doses of lactulose which did not result a bowel movement.  Patient stated that she had been compliant with her lactulose but noted that she had not been having frequent bowel movements.    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  Ammonia level was elevated at 65.  Pt had two BM after presentation and was more alert the day after admission, and pt said she felt ready to go home.  Home lactulose 30g was increased to TID, up from BID.  Continued Rifaximin.  Cr increase 2/2 Dehydration CKD 3b At baseline patient has a serum creatinine of 1.35 and on admission it was  1.65.  Cr improved to 1.39 after gentle IV hydration.  Hypokalemia Supplement potassium  Diabetes mellitus 2 Hypoglycemic episodes Pt had episodes of BG in 50's and 60's during hospitalization without having received insulin.  Pt takes metformin at home and A1c was 5.7.  It's possible that pt also had been having hypoglycemia at home which may have contributed to her AMS.  Metformin was d/c'ed at discharge since A1c was low and hypoglycemia would have caused more harm.   Discharge Diagnoses:  Principal Problem:   Hepatic encephalopathy ([place]) Active Problems:   Type 2 diabetes mellitus with stage 3 chronic kidney disease (HCC)   S/P TIPS (transjugular intrahepatic portosystemic shunt)   Hypokalemia   Dehydration    Discharge Instructions:  Allergies as of 04/24/2020   No Known Allergies     Medication List    STOP taking these medications   metFORMIN 500 MG tablet Commonly known as: GLUCOPHAGE     TAKE these medications   cyanocobalamin 1000 MCG/ML injection Commonly known as: (VITAMIN B-12) Inject 1,000 mcg into the muscle every 30 (thirty) days.   furosemide 40 MG tablet Commonly known as: LASIX Check with your original prescriber whether you should continue to take Lasix. What changed: additional instructions   hydrOXYzine 10 MG tablet Commonly known as: ATARAX/VISTARIL TAKE 1 TABLET (10 MG TOTAL) BY MOUTH 3 (THREE) TIMES DAILY AS NEEDED FOR ITCHING.   lactulose 10 GM/15ML solution Commonly known  as: CHRONULAC Take 45 mLs (30 g total) by mouth 3 (three) times daily. An increase from twice a day. What changed:   when to take this  additional instructions   Misc. Devices Kit Moderate compression hose 20-30 MM HG   nystatin powder Commonly known as: MYCOSTATIN/NYSTOP Apply 1 g topically as needed (rash).   ondansetron 4 MG tablet Commonly known as: ZOFRAN ondansetron HCl 4 mg tablet  TAKE 1 TABLET BY MOUTH EVERY 6 HOURS AS NEEDED   OneTouch Delica  Lancets 69C Misc 2 (two) times daily.   OneTouch Ultra test strip Generic drug: glucose blood 2 (two) times daily.   pantoprazole 40 MG tablet Commonly known as: PROTONIX Take 40 mg by mouth 2 (two) times daily.   potassium chloride 10 MEQ tablet Commonly known as: KLOR-CON Take 1 tablet (10 mEq total) by mouth daily. What changed:   how much to take  how to take this  when to take this  additional instructions   rifaximin 550 MG Tabs tablet Commonly known as: Xifaxan TAKE 1 TABLET (550 MG TOTAL) BY MOUTH 2 (TWO) TIMES DAILY.   spironolactone 50 MG tablet Commonly known as: ALDACTONE TAKE 1 TABLET BY MOUTH EVERY DAY        Follow-up Information    Lorelee Market, MD. Schedule an appointment as soon as possible for a visit in 1 week(s).   Specialty: Family Medicine Contact information: [place] [address] [phone]        Nelva Bush, MD .   Specialty: Cardiology Contact information: [address] [place] [address] [phone]               No Known Allergies   The results of significant diagnostics from this hospitalization (including imaging, microbiology, ancillary and laboratory) are listed below for reference.   Consultations:   Procedures/Studies: US Venous Img Lower Bilateral (DVT)  Result Date: 03/30/2020 CLINICAL DATA:  Lower extremity edema EXAM: BILATERAL LOWER EXTREMITY VENOUS DUPLEX ULTRASOUND TECHNIQUE: Gray-scale sonography with graded compression, as well as color Doppler and duplex ultrasound were performed to evaluate the lower extremity deep venous systems from the level of the common femoral vein and including the common femoral, femoral, profunda femoral, popliteal and calf veins including the posterior tibial, peroneal and gastrocnemius veins when visible. The superficial great saphenous vein was also interrogated. Spectral Doppler was utilized to evaluate flow at rest and with  distal augmentation maneuvers in the common femoral, femoral and popliteal veins. COMPARISON:  None. FINDINGS: RIGHT LOWER EXTREMITY Common Femoral Vein: No evidence of thrombus. Normal compressibility, respiratory phasicity and response to augmentation. Saphenofemoral Junction: No evidence of thrombus. Normal compressibility and flow on color Doppler imaging. Profunda Femoral Vein: No evidence of thrombus. Normal compressibility and flow on color Doppler imaging. Femoral Vein: No evidence of thrombus. Normal compressibility, respiratory phasicity and response to augmentation. Popliteal Vein: No evidence of thrombus. Normal compressibility, respiratory phasicity and response to augmentation. Calf Veins: No evidence of thrombus. Normal compressibility and flow on color Doppler imaging. Superficial Great Saphenous Vein: No evidence of thrombus. Normal compressibility. Venous Reflux:  None. Other Findings:  None. LEFT LOWER EXTREMITY Common Femoral Vein: There is calcified deep venous thrombosis at the junction of the distal common femoral vein and origin of the femoral vein. There is diminished flow in this area focally and diminished compression at this site. The remainder of the common femoral vein appears unremarkable with no acute appearing deep venous thrombosis. Elsewhere in  this vessel, there is normal compression and augmentation as well as normal appearing venous Doppler signal. Saphenofemoral Junction: No evidence of thrombus. Normal compressibility and flow on color Doppler imaging. Profunda Femoral Vein: No evidence of thrombus. Normal compressibility and flow on color Doppler imaging. Femoral Vein: Apparent chronic deep venous thrombosis at the origin of this vessel as noted above. Elsewhere no evidence of thrombus. Normal compressibility, respiratory phasicity and response to augmentation. Popliteal Vein: No evidence of thrombus. Normal compressibility, respiratory phasicity and response to augmentation.  Calf Veins: No evidence of thrombus. Normal compressibility and flow on color Doppler imaging. Superficial Great Saphenous Vein: No evidence of thrombus. Normal compressibility. Venous Reflux:  None. Other Findings:  None. IMPRESSION: 1. No evidence of deep venous thrombosis in either lower extremity in the right lower extremity. 2. Apparent chronic deep venous thrombosis, calcified, at the junction of the distal common femoral vein and origin of the femoral vein with diminished flow focally at this site. No acute appearing deep venous thrombosis seen on the left. No other site of chronic deep venous thrombosis on the left. These results will be called to the ordering clinician or representative by the Radiology Department at the imaging location. Electronically Signed   By: Lowella Grip III M.D.   On: 03/30/2020 14:40      Labs: BNP (last 3 results) Recent Labs    04/14/20 1430  BNP 423.9*   Basic Metabolic Panel: Recent Labs  Lab 04/22/20 1951 04/24/20 0506  NA 140 140  K 3.4* 3.5  CL 110 112*  CO2 20* 20*  GLUCOSE 131* 125*  BUN 16 14  CREATININE 1.65* 1.39*  CALCIUM 9.1 8.6*   Liver Function Tests: Recent Labs  Lab 04/22/20 1951  AST 37  ALT 16  ALKPHOS 120  BILITOT 1.6*  PROT 6.8  ALBUMIN 2.8*   No results for input(s): LIPASE, AMYLASE in the last 168 hours. Recent Labs  Lab 04/22/20 1951  AMMONIA 65*   CBC: Recent Labs  Lab 04/20/20 1322 04/22/20 1951 04/24/20 0506  WBC 3.7* 3.7* 3.1*  HGB 9.8* 10.1* 8.1*  HCT 31.4* 31.9* 24.3*  MCV 90.2 88.9 86.8  PLT 125* 133* 105*   Cardiac Enzymes: No results for input(s): CKTOTAL, CKMB, CKMBINDEX, TROPONINI in the last 168 hours. BNP: Invalid input(s): POCBNP CBG: Recent Labs  Lab 04/23/20 1242 04/23/20 1634 04/23/20 2117 04/24/20 0748 04/24/20 1134  GLUCAP 62* 58* 119* 108* 140*   D-Dimer No results for input(s): DDIMER in the last 72 hours. Hgb A1c Recent Labs    04/22/20 1951  HGBA1C 5.7*     Lipid Profile No results for input(s): CHOL, HDL, LDLCALC, TRIG, CHOLHDL, LDLDIRECT in the last 72 hours. Thyroid function studies No results for input(s): TSH, T4TOTAL, T3FREE, THYROIDAB in the last 72 hours.  Invalid input(s): FREET3 Anemia work up No results for input(s): VITAMINB12, FOLATE, FERRITIN, TIBC, IRON, RETICCTPCT in the last 72 hours. Urinalysis    Component Value Date/Time   COLORURINE YELLOW (A) 04/22/2020 1951   APPEARANCEUR CLEAR (A) 04/22/2020 1951   LABSPEC 1.006 04/22/2020 1951   PHURINE 7.0 04/22/2020 1951   GLUCOSEU NEGATIVE 04/22/2020 1951   HGBUR NEGATIVE 04/22/2020 [place] NEGATIVE 04/22/2020 [place] NEGATIVE 04/22/2020 1951   PROTEINUR NEGATIVE 04/22/2020 1951   NITRITE NEGATIVE 04/22/2020 1951   LEUKOCYTESUR SMALL (A) 04/22/2020 1951   Sepsis Labs Invalid input(s): PROCALCITONIN,  WBC,  LACTICIDVEN Microbiology Recent Results (from the past 240 hour(s))  SARS  CORONAVIRUS 2 (TAT 6-24 HRS) Nasopharyngeal Nasopharyngeal Swab     Status: None   Collection Time: 04/23/20  4:57 AM   Specimen: Nasopharyngeal Swab  Result Value Ref Range Status   SARS Coronavirus 2 NEGATIVE NEGATIVE Final    Comment: (NOTE) SARS-CoV-2 target nucleic acids are NOT DETECTED.  The SARS-CoV-2 RNA is generally detectable in upper and lower respiratory specimens during the acute phase of infection. Negative results do not preclude SARS-CoV-2 infection, do not rule out co-infections with other pathogens, and should not be used as the sole basis for treatment or other patient management decisions. Negative results must be combined with clinical observations, patient history, and epidemiological information. The expected result is Negative.  Fact Sheet for Patients: SugarRoll.be  Fact Sheet for Healthcare Providers: https://www.woods-mathews.com/  This test is not yet approved or cleared by the [place]  FDA and  has been authorized for detection and/or diagnosis of SARS-CoV-2 by FDA under an Emergency Use Authorization (EUA). This EUA will remain  in effect (meaning this test can be used) for the duration of the COVID-19 declaration under Se ction 564(b)(1) of the Act, 21 U.S.C. section 360bbb-3(b)(1), unless the authorization is terminated or revoked sooner.  Performed at [facility], [place] [address], [place], [place] [address]      Total time spend on discharging this patient, including the last patient exam, discussing the hospital stay, instructions for ongoing care as it relates to all pertinent caregivers, as well as preparing the medical discharge records, prescriptions, and/or referrals as applicable, is 40 minutes.    Enzo Bi, MD  [facility] 04/24/2020, 3:33 PM  If 7PM-7AM, please contact night-coverage

## 2020-04-27 ENCOUNTER — Ambulatory Visit: Payer: Medicare HMO

## 2020-04-27 ENCOUNTER — Ambulatory Visit: Payer: Medicare HMO | Admitting: Hematology and Oncology

## 2020-04-27 ENCOUNTER — Inpatient Hospital Stay: Payer: Medicare HMO

## 2020-04-29 ENCOUNTER — Encounter: Payer: Self-pay | Admitting: Gastroenterology

## 2020-04-30 ENCOUNTER — Encounter: Payer: Self-pay | Admitting: Family Medicine

## 2020-05-02 DIAGNOSIS — R918 Other nonspecific abnormal finding of lung field: Secondary | ICD-10-CM | POA: Insufficient documentation

## 2020-05-03 ENCOUNTER — Ambulatory Visit: Payer: Medicare HMO | Admitting: Internal Medicine

## 2020-05-03 ENCOUNTER — Ambulatory Visit (INDEPENDENT_AMBULATORY_CARE_PROVIDER_SITE_OTHER): Payer: Medicare HMO | Admitting: Gastroenterology

## 2020-05-03 ENCOUNTER — Encounter: Payer: Self-pay | Admitting: Gastroenterology

## 2020-05-03 ENCOUNTER — Other Ambulatory Visit: Payer: Self-pay

## 2020-05-03 VITALS — BP 132/71 | HR 83 | Temp 98.0°F | Ht 64.0 in | Wt 186.5 lb

## 2020-05-03 DIAGNOSIS — K7290 Hepatic failure, unspecified without coma: Secondary | ICD-10-CM | POA: Diagnosis not present

## 2020-05-03 DIAGNOSIS — D509 Iron deficiency anemia, unspecified: Secondary | ICD-10-CM

## 2020-05-03 DIAGNOSIS — K7460 Unspecified cirrhosis of liver: Secondary | ICD-10-CM | POA: Diagnosis not present

## 2020-05-03 NOTE — Progress Notes (Addendum)
[name], MD [address]  [place], [place] [address]  Main: [phone]  Fax: [phone]    Gastroenterology Consultation  Referring Provider:     Lorelee Market, MD Primary Care Physician:  Lorelee Market, MD Primary Gastroenterologist:  Dr. [name] Reason for Consultation:     Decompensated cirrhosis        HPI:   Crystal [name] is a 69 y.o. female referred by Dr. Lorelee Market, MD  for consultation & management of decompensated cirrhosis.   Follow-up visit 06/26/2019 Patient was told to make a follow-up to see me to discuss about abnormal labs by her PCP.  I reviewed the labs from 06/12/2019 which revealed normal hemoglobin, platelets 110, normal BUN/creatinine, total bilirubin 2, alkaline phosphatase 170.  She had repeat labs at Dr. Pasty Arch office in [place] on 8/3, alkaline phosphatase 173, AST 60, total bilirubin 1.5, creatinine 1.18.  Patient reports she has been doing very well.  She denies any swelling of legs, dark urine, pruritus, abdominal pain, melena.  She feels she has remained the same from liver standpoint.  She is requesting lactulose for 3 months supply  She is single, lives with a roommate  Follow-up visit 05/03/2020 Patient was recently admitted to [facility] on 6/12 secondary to increasing fatigue.  Her hemoglobin A1c was low and she had hypoglycemic episode.  Therefore, antidiabetic medications were discontinued.  Her hemoglobin was 8.1 during that admission.  Patient is here for follow-up of decompensated cirrhosis.  She reports increasing fatigue.  She says she has not been able to perform her daily activities like she used to do before.  She reports mild swelling of her feet.  She denies any weight gain.She denies fever, chills, abdominal pain, nausea or vomiting, hematemesis, rectal bleeding, hematochezia.  She denies black stools or melena.  She underwent upper endoscopy which revealed gastric AVMs that were cauterized.   Subsequently, underwent video capsule endoscopy which was unremarkable.  She does have progressively worsening anemia without active GI bleed. She is compliant with her diuretics, lactulose, rifaximin.  She is also noted to have declining renal function. She does report nocturnal pruritus for which she takes hydroxyzine 10 mg every 5 hours at night  NSAIDs: none including BC powder or Goody powder  Antiplts/Anticoagulants/Anti thrombotics: none  GI Procedures:  Video capsule endoscopy 03/29/2020 Capsule study results:  Non specific no bleeding tiny erythematous spots in small bowel. If patient develops worse anemia recommend a push enteroscopy   EGD 03/08/2020 - A few non-bleeding angioectasias in the duodenum. Treated with argon plasma coagulation (APC). - Gastric antral vascular ectasia with bleeding. Treated with argon plasma coagulation (APC). - A single non-bleeding angioectasia in the stomach. Treated with bipolar cautery. - Normal gastroesophageal junction and esophagus. - No specimens collected.  EGD 11/19/2018 - Normal duodenal bulb and second portion of the duodenum. - Gastric antral vascular ectasia with bleeding. Treated with argon plasma coagulation (APC). - Normal gastroesophageal junction and esophagus. - No specimens collected.  EGD 08/03/2018 - Normal duodenal bulb and second portion of the duodenum. - Portal hypertensive gastropathy. - Bleeding large (> 5 mm) esophageal varices, ligation was unsuccessful. - No specimens collected.  EGD 04/22/2018 - Normal duodenal bulb and second portion of the duodenum. - Non-bleeding gastric ulcer with a clean ulcer base (Forrest Class III). - Portal hypertensive gastropathy. - Large (> 5 mm) esophageal varices. Incompletely eradicated. Banded x 2. - No specimens collected.  EGD 03/25/2018 - Two non-bleeding  angioectasias in the duodenum. Treated with argon plasma coagulation (APC). - A few non-bleeding angioectasias in the  stomach. Treated with argon plasma coagulation (APC). - Non-bleeding gastric ulcers with a clean ulcer base (Forrest Class III). - Non-bleeding large (> 5 mm) esophageal varices. Incompletely eradicated. Banded. - No specimens collected.   Colonoscopy 03/25/2018 - One 5 mm polyp in the ascending colon, removed with a cold snare. Resected and retrieved. - Rectal varices. - External hemorrhoids. - The examination was otherwise normal.  DIAGNOSIS:  A. COLON POLYP, ASCENDING; COLD SNARE:  - TUBULAR ADENOMA.  - NEGATIVE FOR HIGH-GRADE DYSPLASIA AND MALIGNANCY.  EGD 02/09/18 The examined duodenum was normal. The stomach was normal. Five columns of non-bleeding grade III varices were found in the lower third of the esophagus,. Stigmata of recent bleeding were evident and red wale signs were present. Ten bands were successfully placed with incomplete eradication of varices. There was no bleeding during, and at the end, of the procedure.  Past Medical History:  Diagnosis Date  . Acute GI bleeding 02/09/2018  . Acute upper gastrointestinal bleeding 08/03/2018  . Anemia    TAKES IRON TAB  . Arthritis    SHOULDER  . B12 deficiency 02/18/2018  . Bleeding    GI  3/19  . Bronchitis    HX OF  . Cirrhosis ([place])   . Colon cancer screening   . Diabetes mellitus without complication ([place])    TYPE 2  . Fatty (change of) liver, not elsewhere classified   . Full dentures    UPPER AND LOWER  . Iron deficiency anemia due to chronic blood loss 02/18/2018  . Lung nodule, multiple   . Pernicious anemia 02/22/2018  . PUD (peptic ulcer disease)   . Raynaud's syndrome   . Upper GI bleeding 08/03/2018    Past Surgical History:  Procedure Laterality Date  . CATARACT EXTRACTION W/PHACO Right 02/06/2018   Procedure: CATARACT EXTRACTION PHACO AND INTRAOCULAR LENS PLACEMENT ([place]) RIGHT DIABETIC;  Surgeon: Leandrew Koyanagi, MD;  Location: [place];  Service: Ophthalmology;  Laterality: Right;   DIABETIC, ORAL MED  . CATARACT EXTRACTION W/PHACO Left 04/30/2018   Procedure: CATARACT EXTRACTION PHACO AND INTRAOCULAR LENS PLACEMENT ([facility]);  Surgeon: Leandrew Koyanagi, MD;  Location: [facility];  Service: Ophthalmology;  Laterality: Left;  [place] 01:04 AP% 21.3 CDE 13.66 Fluid pack lot # [id number]  . COLONOSCOPY WITH PROPOFOL N/A 03/25/2018   Procedure: COLONOSCOPY WITH PROPOFOL;  Surgeon: Lin Landsman, MD;  Location[facility];  Service: Gastroenterology;  Laterality: N/A;  . DENTAL SURGERY     EXTRACTIONS  . ELECTROMAGNETIC NAVIGATION BROCHOSCOPY Right 01/27/2019   Procedure: ELECTROMAGNETIC NAVIGATION BRONCHOSCOPY;  Surgeon: [name], MD;  Location: [facility];  Service: Cardiopulmonary;  Laterality: Right;  . ESOPHAGOGASTRODUODENOSCOPY N/A 08/03/2018   Procedure: ESOPHAGOGASTRODUODENOSCOPY (EGD);  Surgeon: Lin Landsman, MD;  Location: [facility]COPY;  Service: Gastroenterology;  Laterality: N/A;  . ESOPHAGOGASTRODUODENOSCOPY (EGD) WITH PROPOFOL N/A 02/10/2018   Procedure: ESOPHAGOGASTRODUODENOSCOPY (EGD) WITH PROPOFOL;  Surgeon: Jonathon Bellows, MD;  Location: [facility] ENDOSCOPY;  Service: Gastroenterology;  Laterality: N/A;  . ESOPHAGOGASTRODUODENOSCOPY (EGD) WITH PROPOFOL N/A 03/25/2018   Procedure: ESOPHAGOGASTRODUODENOSCOPY (EGD) WITH PROPOFOL;  Surgeon: Lin Landsman, MD;  Location: [facility] ENDOSCOPY;  Service: Gastroenterology;  Laterality: N/A;  . ESOPHAGOGASTRODUODENOSCOPY (EGD) WITH PROPOFOL N/A 04/22/2018   Procedure: ESOPHAGOGASTRODUODENOSCOPY (EGD) WITH PROPOFOL with band ligation;  Surgeon: Lin Landsman, MD;  Location: [place];  Service: Gastroenterology;  Laterality: N/A;  . ESOPHAGOGASTRODUODENOSCOPY (EGD) WITH PROPOFOL N/A 06/24/2018  Procedure: ESOPHAGOGASTRODUODENOSCOPY (EGD) WITH PROPOFOL;  Surgeon: Lin Landsman, MD;  Location[facility]COPY;  Service: Gastroenterology;  Laterality: N/A;  . ESOPHAGOGASTRODUODENOSCOPY (EGD) WITH PROPOFOL N/A  11/19/2018   Procedure: ESOPHAGOGASTRODUODENOSCOPY (EGD) WITH PROPOFOL;  Surgeon: Lin Landsman, MD;  Location: [facility] ENDOSCOPY;  Service: Gastroenterology;  Laterality: N/A;  . ESOPHAGOGASTRODUODENOSCOPY (EGD) WITH PROPOFOL N/A 03/08/2020   Procedure: ESOPHAGOGASTRODUODENOSCOPY (EGD) WITH PROPOFOL;  Surgeon: Lin Landsman, MD;  Location: [facility] ENDOSCOPY;  Service: Gastroenterology;  Laterality: N/A;  . EYE SURGERY    . GIVENS CAPSULE STUDY N/A 03/29/2020   Procedure: GIVENS CAPSULE STUDY;  Surgeon: Lin Landsman, MD;  Location: [facility] ENDOSCOPY;  Service: Gastroenterology;  Laterality: N/A;  . HEMORRHOID BANDING  03/25/2018   Procedure: HEMORRHOID BANDING;  Surgeon: Lin Landsman, MD;  Location: [facility] ENDOSCOPY;  Service: Gastroenterology;;  . IR EMBO ART  VEN HEMORR LYMPH EXTRAV  INC GUIDE ROADMAPPING  08/07/2018  . IR RADIOLOGIST EVAL & MGMT  09/03/2018  . IR RADIOLOGIST EVAL & MGMT  12/17/2018  . IR RADIOLOGIST EVAL & MGMT  06/24/2019  . IR RADIOLOGIST EVAL & MGMT  01/22/2020  . IR TIPS  08/07/2018  . RADIOLOGY WITH ANESTHESIA N/A 08/07/2018   Procedure: TIPS;  Surgeon: Corrie Mckusick, DO;  Location: [place];  Service: Anesthesiology;  Laterality: N/A;  . TONSILLECTOMY       Current Outpatient Medications:  .  cyanocobalamin (,VITAMIN B-12,) 1000 MCG/ML injection, Inject 1,000 mcg into the muscle every 30 (thirty) days. , Disp: , Rfl:  .  furosemide (LASIX) 40 MG tablet, Check with your original prescriber whether you should continue to take Lasix., Disp: 90 tablet, Rfl: 2 .  hydrOXYzine (ATARAX/VISTARIL) 10 MG tablet, TAKE 1 TABLET (10 MG TOTAL) BY MOUTH 3 (THREE) TIMES DAILY AS NEEDED FOR ITCHING., Disp: 30 tablet, Rfl: 0 .  lactulose (CHRONULAC) 10 GM/15ML solution, Take 45 mLs (30 g total) by mouth 3 (three) times daily. An increase from twice a day., Disp: 4050 mL, Rfl: 0 .  Misc. Devices KIT, Moderate compression hose 20-30 MM HG, Disp: 1 kit, Rfl: 0 .  nystatin  (MYCOSTATIN/NYSTOP) powder, Apply 1 g topically as needed (rash). , Disp: , Rfl:  .  ondansetron (ZOFRAN) 4 MG tablet, ondansetron HCl 4 mg tablet  TAKE 1 TABLET BY MOUTH EVERY 6 HOURS AS NEEDED, Disp: , Rfl:  .  OneTouch Delica Lancets 17G MISC, 2 (two) times daily., Disp: , Rfl:  .  ONETOUCH ULTRA test strip, 2 (two) times daily., Disp: , Rfl:  .  pantoprazole (PROTONIX) 40 MG tablet, Take 40 mg by mouth 2 (two) times daily., Disp: , Rfl:  .  potassium chloride (KLOR-CON) 10 MEQ tablet, Take 1 tablet (10 mEq total) by mouth daily., Disp: 90 tablet, Rfl: 3 .  spironolactone (ALDACTONE) 50 MG tablet, TAKE 1 TABLET BY MOUTH EVERY DAY, Disp: 90 tablet, Rfl: 0   Family History  Problem Relation Age of Onset  . CAD Father   . Heart attack Father 79  . Cancer Maternal Uncle   . Seizures Mother      Social History   Tobacco Use  . Smoking status: Never Smoker  . Smokeless tobacco: Never Used  Vaping Use  . Vaping Use: Never used  Substance Use Topics  . Alcohol use: Not Currently    Comment: No EtOH for 30 years.  Never a heavy drinker.  . Drug use: Never    Allergies as of 05/03/2020  . (No Known Allergies)  Review of Systems:    All systems reviewed and negative except where noted in HPI.   Physical Exam:  BP 132/71 (BP Location: Left Arm, Patient Position: Sitting, Cuff Size: Normal)   Pulse 83   Temp 98 F (36.7 C) (Oral)   Ht 5' 4" (1.626 m)   Wt 186 lb 8 oz (84.6 kg)   LMP  (LMP Unknown)   BMI 32.01 kg/m  No LMP recorded (lmp unknown). Patient is postmenopausal.  General:   Alert,  Well-developed, well-nourished, pleasant and cooperative in NAD, lethargic Head:  Normocephalic and atraumatic. Eyes:  Sclera clear, no icterus.   Conjunctiva pink. Ears:  Normal auditory acuity. Nose:  No deformity, discharge, or lesions. Mouth:  No deformity or lesions,oropharynx pink & moist. Neck:  Supple; no masses or thyromegaly. Lungs:  Respirations even and unlabored.   Clear throughout to auscultation.   No wheezes, crackles, or rhonchi. No acute distress. Heart:  Regular rate and rhythm; no murmurs, clicks, rubs, or gallops. Abdomen:  Normal bowel sounds. Soft, non-tender and nondistended, without masses, hepatosplenomegaly or hernias noted.  No guarding or rebound tenderness.   Rectal: Not performed Msk:  Symmetrical without gross deformities. muscle wasting in bilateral upper extremities Good, equal movement & strength bilaterally. Pulses:  Normal pulses noted. Extremities:  No clubbing, 2+ edema, no cyanosis Neurologic:  Alert and oriented x3;  grossly normal neurologically. Skin: telangiectasias on upper anterior chest, the rash in her bilateral groin appears like a erythematous ring with central clearing, dry scaly margins Lymph Nodes:  No significant cervical adenopathy. Psych:  Alert and cooperative. Normal mood and affect.  Imaging Studies: Reviewed. CT A/P with contrast on 02/09/2018 revealed mildly enlarged retroperitoneal lymph nodes, largest with 11 mm concerning for metastatic disease or malignancy,18 mm spiculated density in the right lower lobe concerning for malignancy associated with small left pleural effusion  Assessment and Plan:   Mollye A Mcglothen is a 69 y.o. Caucasian female with metabolic syndrome, with decompensated cirrhosis secondary to ascites and refractory variceal bleed from esophageal varices status post TIPS and coil embolization of the left gastric vein here for follow-up  Decompensated cirrhosis: child class B, MELD-Na 9 Secondary liver disease workup positive for HCV antibodies, but viral load not detected, anti-smooth muscle antibodies weakly positive. 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 Ceruloplasmin, antimitochondrial antibodies, hepatitis B, alpha-1 antitrypsin came back negative Portal hypertension: manifested as ascites, esophageal varices, splenomegaly.  Patient  currently has no ascites since placement of TIPS, controlled on low-sodium diet S/p TIPS: Secondary to refractory variceal bleed. Follow-up with interventional radiology every 6 months.  TIPS patent Ascites/volume overload: No evidence of ascites, does have bilateral swelling of legs.  Will hold diuretics due to worsening kidney function, repeat BMP in 1 week. Suggested her to check weight daily. Continue low-sodium diet. Avoid processed meats, and foods, chips, carbonated beverages.  Patient evaluated by cardiology, stable cardiac function, advised no further intervention at this time. Varices: variceal bleed from esophageal varices, status post EGD on 02/10/2018, EVLx10, status post EGD 03/25/2018 EVLx4, s/p EGD 04/22/18 EVLx2 and hemospray, refractory variceal bleed 08/03/2018, status post TIPS with coil embolization of left gastric vein on 08/07/2018.  Stopped propranolol  Repeat EGD in 11/2018 revealed no varices at all, GAVE treated with APC.   Pruritus: Continue hydroxyzine 10 mg 3 times a day as needed most likely secondary to cholestasis.  Stable alkaline phosphatase, increase hydroxyzine to 20 mg if needed  Anemia: Worsening, previously was iron deficient secondary to blood loss from combination of peptic ulcer disease, gastric AVMs, portal hypertensive gastropathy, esophageal varices, GAVE.  However, her hemoglobin is declining despite iron replacement.  She does not have iron and B12 deficiency at this time.  Iron studies are consistent with anemia of chronic disease.  Likely secondary to worsening kidney function.  Will refer her to nephrology.  There is no evidence of coagulopathy.  She has mild thrombocytopenia HCC screening: AFP normal, no liver lesions on CT in 07/2018.  Repeat ultrasound in 05/2019 with no liver lesions.  Recommend MRI liver mass protocol with MRCP due to elevated alkaline phosphatase and total bilirubin PSE: Started on lactulose post TIPS.  Continue rifaximin 550 mg twice  daily, long-term.  HRS: Does have declining renal function since 01/2020, GFR between 30 and 40 Hold diuretics, recheck BMP in 1 week, referral to nephrology Hepatic hydrothorax: none   Health maintenance:  She is immune to hepatitis B She received Pneumovax and influenza vaccine in 07/2018 vitamin D levels 29.2 on 03/15/2018 Avoid excess Tylenol and NSAIDs use Colon cancer screening:underwent colonoscopy on 04/11/2018, One 5 mm polyp in the ascending colon, removed with a cold snare, Patch showed tubular adenoma with no evidence of high-grade dysplasia or malignancy. Recommend repeat colonoscopy in 5 years Right lower lobe lung lesion and retroperitoneal lymphadenopathy: follow-up with Dr. Mike Gip, no evidence of retroperitoneal lymphadenopathy. Repeat CT chest revealed that the lesions were stable.  Patient underwent bronchoscopy, sample was limited, no evidence of malignancy.  Plan to monitor the lesion with imaging for now  Follow up in 3 months   [name], MD

## 2020-05-03 NOTE — Patient Instructions (Signed)
Increased spirolactone to 173m daily

## 2020-05-04 ENCOUNTER — Telehealth: Payer: Self-pay

## 2020-05-04 DIAGNOSIS — N289 Disorder of kidney and ureter, unspecified: Secondary | ICD-10-CM

## 2020-05-04 DIAGNOSIS — D509 Iron deficiency anemia, unspecified: Secondary | ICD-10-CM

## 2020-05-04 LAB — CBC
Hematocrit: 25.5 % — ABNORMAL LOW (ref 34.0–46.6)
Hemoglobin: 8.6 g/dL — ABNORMAL LOW (ref 11.1–15.9)
MCH: 28.3 pg (ref 26.6–33.0)
MCHC: 33.7 g/dL (ref 31.5–35.7)
MCV: 84 fL (ref 79–97)
Platelets: 133 10*3/uL — ABNORMAL LOW (ref 150–450)
RBC: 3.04 x10E6/uL — ABNORMAL LOW (ref 3.77–5.28)
RDW: 15.6 % — ABNORMAL HIGH (ref 11.7–15.4)
WBC: 4.3 10*3/uL (ref 3.4–10.8)

## 2020-05-04 NOTE — Telephone Encounter (Signed)
Patient called back and left a voicemail on my phone for call back. Called patient back and left a message for call back

## 2020-05-04 NOTE — Telephone Encounter (Signed)
Called and left a message for call back. Sent referral by fax to [facility]

## 2020-05-04 NOTE — Telephone Encounter (Signed)
Patient verbalized understanding. Patient states she will come get labs in 1 week and will stop the Lasix and Spironolactone.  She will make appointment with kidney doctor when they call

## 2020-05-04 NOTE — Telephone Encounter (Signed)
-----  Message from Lin Landsman, MD sent at 05/04/2020  9:40 AM EDT ----- Worsening kidney function and anemia, recommend to hold Lasix and spironolactone for 1 week.  Recheck BMP in 1 weekReferral to nephrology, [facility], Dr. Wende Bushy

## 2020-05-04 NOTE — Telephone Encounter (Signed)
Called and left a message for call back

## 2020-05-06 DIAGNOSIS — I10 Essential (primary) hypertension: Secondary | ICD-10-CM | POA: Diagnosis not present

## 2020-05-06 DIAGNOSIS — D649 Anemia, unspecified: Secondary | ICD-10-CM | POA: Diagnosis not present

## 2020-05-06 DIAGNOSIS — E669 Obesity, unspecified: Secondary | ICD-10-CM | POA: Diagnosis not present

## 2020-05-06 DIAGNOSIS — E119 Type 2 diabetes mellitus without complications: Secondary | ICD-10-CM | POA: Diagnosis not present

## 2020-05-10 ENCOUNTER — Telehealth: Payer: Self-pay

## 2020-05-10 NOTE — Telephone Encounter (Signed)
Patient is scheduled with [facility] on 06/10/2020 with Dr. Holley Raring

## 2020-05-18 ENCOUNTER — Telehealth: Payer: Self-pay

## 2020-05-18 DIAGNOSIS — K7290 Hepatic failure, unspecified without coma: Secondary | ICD-10-CM

## 2020-05-18 DIAGNOSIS — R609 Edema, unspecified: Secondary | ICD-10-CM

## 2020-05-18 DIAGNOSIS — K7460 Unspecified cirrhosis of liver: Secondary | ICD-10-CM

## 2020-05-18 NOTE — Telephone Encounter (Signed)
Patient called and wants to inform us that since her last appointment she has gained 30 pounds of fluid

## 2020-05-20 NOTE — Telephone Encounter (Signed)
Check BMP ASAP before restarting diuretics and order US liver doppler to evaluate for TIPS patency  RV

## 2020-05-21 NOTE — Telephone Encounter (Signed)
Patient verbalized understanding of time of scan

## 2020-05-21 NOTE — Telephone Encounter (Signed)
Patient verbalized understanding. She states she will go get labs done today. Order the labs and ultrasound. Patient can not do 07/19 07/20 07/29. Called and got patient scheduled at med center [place] on 05/24/2020 arrived 10:00am for a 10:15 scan.

## 2020-05-21 NOTE — Addendum Note (Signed)
Addended by: Ulyess Blossom L on: 05/21/2020 08:23 AM   Modules accepted: Orders

## 2020-05-24 ENCOUNTER — Inpatient Hospital Stay: Payer: Medicare HMO | Attending: Hematology and Oncology

## 2020-05-24 ENCOUNTER — Other Ambulatory Visit: Payer: Self-pay

## 2020-05-24 ENCOUNTER — Ambulatory Visit: Payer: Medicare HMO

## 2020-05-24 VITALS — BP 123/57 | HR 55 | Temp 97.0°F | Resp 18

## 2020-05-24 DIAGNOSIS — R609 Edema, unspecified: Secondary | ICD-10-CM | POA: Diagnosis not present

## 2020-05-24 DIAGNOSIS — E538 Deficiency of other specified B group vitamins: Secondary | ICD-10-CM | POA: Insufficient documentation

## 2020-05-24 DIAGNOSIS — D500 Iron deficiency anemia secondary to blood loss (chronic): Secondary | ICD-10-CM | POA: Diagnosis not present

## 2020-05-24 DIAGNOSIS — K7460 Unspecified cirrhosis of liver: Secondary | ICD-10-CM | POA: Diagnosis not present

## 2020-05-24 DIAGNOSIS — R69 Illness, unspecified: Secondary | ICD-10-CM | POA: Diagnosis not present

## 2020-05-24 DIAGNOSIS — K7290 Hepatic failure, unspecified without coma: Secondary | ICD-10-CM | POA: Diagnosis not present

## 2020-05-24 MED ORDER — CYANOCOBALAMIN 1000 MCG/ML IJ SOLN
1000.0000 ug | Freq: Once | INTRAMUSCULAR | Status: AC
  Administered 2020-05-24: 1000 ug via INTRAMUSCULAR

## 2020-05-25 ENCOUNTER — Inpatient Hospital Stay: Payer: Medicare HMO

## 2020-05-25 ENCOUNTER — Telehealth: Payer: Self-pay

## 2020-05-25 LAB — BASIC METABOLIC PANEL
BUN/Creatinine Ratio: 9 — ABNORMAL LOW (ref 12–28)
BUN: 15 mg/dL (ref 8–27)
CO2: 15 mmol/L — ABNORMAL LOW (ref 20–29)
Calcium: 8.6 mg/dL — ABNORMAL LOW (ref 8.7–10.3)
Chloride: 104 mmol/L (ref 96–106)
Creatinine, Ser: 1.71 mg/dL — ABNORMAL HIGH (ref 0.57–1.00)
GFR calc Af Amer: 35 mL/min/{1.73_m2} — ABNORMAL LOW (ref >59)
GFR calc non Af Amer: 30 mL/min/{1.73_m2} — ABNORMAL LOW (ref >59)
Glucose: 176 mg/dL — ABNORMAL HIGH (ref 65–99)
Potassium: 4.1 mmol/L (ref 3.5–5.2)
Sodium: 137 mmol/L (ref 134–144)

## 2020-05-25 NOTE — Telephone Encounter (Signed)
-----  Message from Lin Landsman, MD sent at 05/25/2020  4:47 PM EDT ----- Worsening kidney function, hold diuretics.  Please check with patient if she continues to gain weight  Thanks RV

## 2020-05-25 NOTE — Telephone Encounter (Signed)
Patient states she will stop the medications. She swells when she up and walking in her legs. Patient weighs 200 and was 160 2-3 months. Patient does see kidney doctor end of the month

## 2020-05-26 DIAGNOSIS — R69 Illness, unspecified: Secondary | ICD-10-CM | POA: Diagnosis not present

## 2020-05-26 NOTE — Telephone Encounter (Signed)
Please advise patient to get admitted to the hospital if she is feeling short of breath and cannot wait until her appointment with nephrologist.  I am worried about significant weight gain and not able to continue diuretics  Crystal [name]

## 2020-05-27 ENCOUNTER — Emergency Department
Admission: EM | Admit: 2020-05-27 | Discharge: 2020-05-27 | Disposition: A | Discharge status: Home / Self Care | Payer: Medicare HMO | Attending: Emergency Medicine | Admitting: Emergency Medicine

## 2020-05-27 ENCOUNTER — Emergency Department: Payer: Medicare HMO

## 2020-05-27 ENCOUNTER — Other Ambulatory Visit: Payer: Self-pay

## 2020-05-27 ENCOUNTER — Telehealth: Payer: Self-pay | Admitting: Internal Medicine

## 2020-05-27 ENCOUNTER — Encounter: Payer: Self-pay | Admitting: Emergency Medicine

## 2020-05-27 ENCOUNTER — Encounter: Payer: Self-pay | Admitting: Hematology and Oncology

## 2020-05-27 ENCOUNTER — Ambulatory Visit (INDEPENDENT_AMBULATORY_CARE_PROVIDER_SITE_OTHER): Payer: Medicare HMO

## 2020-05-27 DIAGNOSIS — N183 Chronic kidney disease, stage 3 unspecified: Secondary | ICD-10-CM | POA: Insufficient documentation

## 2020-05-27 DIAGNOSIS — I2510 Atherosclerotic heart disease of native coronary artery without angina pectoris: Secondary | ICD-10-CM | POA: Diagnosis not present

## 2020-05-27 DIAGNOSIS — I5030 Unspecified diastolic (congestive) heart failure: Secondary | ICD-10-CM | POA: Insufficient documentation

## 2020-05-27 DIAGNOSIS — R609 Edema, unspecified: Secondary | ICD-10-CM

## 2020-05-27 DIAGNOSIS — E1122 Type 2 diabetes mellitus with diabetic chronic kidney disease: Secondary | ICD-10-CM | POA: Insufficient documentation

## 2020-05-27 DIAGNOSIS — I5032 Chronic diastolic (congestive) heart failure: Secondary | ICD-10-CM | POA: Diagnosis not present

## 2020-05-27 DIAGNOSIS — R0602 Shortness of breath: Secondary | ICD-10-CM | POA: Insufficient documentation

## 2020-05-27 DIAGNOSIS — R911 Solitary pulmonary nodule: Secondary | ICD-10-CM | POA: Diagnosis not present

## 2020-05-27 DIAGNOSIS — R600 Localized edema: Secondary | ICD-10-CM | POA: Insufficient documentation

## 2020-05-27 HISTORY — DX: Heart failure, unspecified: I50.9

## 2020-05-27 LAB — CBC
HCT: 26.7 % — ABNORMAL LOW (ref 36.0–46.0)
Hemoglobin: 8.6 g/dL — ABNORMAL LOW (ref 12.0–15.0)
MCH: 27.8 pg (ref 26.0–34.0)
MCHC: 32.2 g/dL (ref 30.0–36.0)
MCV: 86.4 fL (ref 80.0–100.0)
Platelets: 125 10*3/uL — ABNORMAL LOW (ref 150–400)
RBC: 3.09 MIL/uL — ABNORMAL LOW (ref 3.87–5.11)
RDW: 15.4 % (ref 11.5–15.5)
WBC: 4.1 10*3/uL (ref 4.0–10.5)
nRBC: 0 % (ref 0.0–0.2)

## 2020-05-27 LAB — BASIC METABOLIC PANEL
Anion gap: 6 (ref 5–15)
BUN: 15 mg/dL (ref 8–23)
CO2: 22 mmol/L (ref 22–32)
Calcium: 8.3 mg/dL — ABNORMAL LOW (ref 8.9–10.3)
Chloride: 109 mmol/L (ref 98–111)
Creatinine, Ser: 1.61 mg/dL — ABNORMAL HIGH (ref 0.44–1.00)
GFR calc Af Amer: 38 mL/min — ABNORMAL LOW (ref >60)
GFR calc non Af Amer: 33 mL/min — ABNORMAL LOW (ref >60)
Glucose, Bld: 105 mg/dL — ABNORMAL HIGH (ref 70–99)
Potassium: 3.4 mmol/L — ABNORMAL LOW (ref 3.5–5.1)
Sodium: 137 mmol/L (ref 135–145)

## 2020-05-27 LAB — ECHOCARDIOGRAM COMPLETE
AR max vel: 3.2 cm2
AV Area VTI: 2.88 cm2
AV Area mean vel: 2.81 cm2
AV Mean grad: 4 mmHg
AV Peak grad: 7.1 mmHg
Ao pk vel: 1.33 m/s
Area-P 1/2: 4.29 cm2
Calc EF: 58.7 %
S' Lateral: 3.4 cm
Single Plane A2C EF: 57.2 %
Single Plane A4C EF: 56.6 %

## 2020-05-27 LAB — BRAIN NATRIURETIC PEPTIDE: B Natriuretic Peptide: 464.9 pg/mL — ABNORMAL HIGH (ref 0.0–100.0)

## 2020-05-27 LAB — TROPONIN I (HIGH SENSITIVITY): Troponin I (High Sensitivity): 10 ng/L (ref <18)

## 2020-05-27 LAB — TSH: TSH: 3.742 u[IU]/mL (ref 0.350–4.500)

## 2020-05-27 MED ORDER — SODIUM CHLORIDE 0.9% FLUSH: 3.0000 mL | Freq: Once | INTRAVENOUS | Status: DC

## 2020-05-27 MED ORDER — FUROSEMIDE 20 MG PO TABS
60.0000 mg | ORAL_TABLET | Freq: Every day | ORAL | 0 refills | Status: DC
Start: 2020-05-27 — End: 2020-05-31

## 2020-05-27 NOTE — Telephone Encounter (Signed)
Thank you  RV

## 2020-05-27 NOTE — Telephone Encounter (Signed)
Attempted to reach patient. It sounded like someone answered but no one spoke. Heard what sound like patient was in a lobby. Shows in [place] she is at the ED now.

## 2020-05-27 NOTE — ED Triage Notes (Signed)
Pt sent by pcp office due to generalized swelling. She has gained almost 50# in less than 3 months; pcp has adjusted her lasix and then took her off of it completely because they said it wasn't doing any good. Pt is having sob upon exertion. Pt alert & oriented, nad noted.

## 2020-05-27 NOTE — ED Notes (Signed)
ED Provider at bedside.

## 2020-05-27 NOTE — Telephone Encounter (Signed)
There are no obvious findings on the echo that necessitate going to the ED.  However, if the patient is significantly short of breath with fluid retention unresponsive to oral diuretics, as noted in GI messages, she may require ED evaluation and admission.  Alternatively, if she feels like her breathing is stable, we could try to see her in the office tomorrow and potentially give her IV furosemide if needed.  Nelva Bush, MD [facility]

## 2020-05-27 NOTE — ED Notes (Signed)
Pt to the er for lower leg swelling bilaterally. Pt has a hx of CHF and was on diuretics, then increased, then told to stop taking 3 days ago. Pt has 3+ pitting edema in lower legs with border line weeping.Pt reports she has been gaining 1.5 pounds a day. Pt reports she is making urine. Pt states she had an echo done this am at the CHF clinic. Pt reports dyspnea with exertion.

## 2020-05-27 NOTE — ED Notes (Signed)
Blood sent to the lab

## 2020-05-27 NOTE — Telephone Encounter (Signed)
Patient calling in stating her GI specialist has suggested patient to go to ED after her echo ( in our office at 1pm) due to swelling and gaining weight.   Please advise

## 2020-05-27 NOTE — Telephone Encounter (Signed)
Patient states she is having shortness and breath when she gets up and walks around. Patient states on 03/06/2020 she weight 181lbs today she is 221lbs. Advised patient that Dr. Marius Ditch wanted her to go to the emergency room and get admitted to the hospital for her weight gain, SOB and unable to take diuretics due to worsening kidney function. Patient states she will go tot the emergency room after her cardiologist appointment at 1:00. She states she has a echocardiogram scheduled. Advised patient they could do that in the hospital she states she will call her cardiologist office and see what they say. She states she will go to the emergency room today

## 2020-05-27 NOTE — Progress Notes (Signed)
[facility]ST consulted to obtain IV access after lab draw unsuccessful. Spoke with pt's nurse who stated she needed an IV bc she is a difficult stick and might need meds or further lab draws. After speaking with patient, assessed bilateral arms with ultrasound. Pt without appropriate vessels in R & L lower arms for IV placement. Vessels in upper arms too deep for USGIV. VAST RN was able to obtain 22g access in L antecubital and collected labwork.

## 2020-05-27 NOTE — Telephone Encounter (Signed)
Do you think ok for patient to wait until after echo to go to ER?

## 2020-05-27 NOTE — Telephone Encounter (Signed)
Called and left a message for call back

## 2020-05-27 NOTE — ED Provider Notes (Signed)
[facility] Emergency Department Provider Note   ____________________________________________   I have reviewed the triage vital signs and the nursing notes.   HISTORY  Chief Complaint Leg Swelling   History limited by: Not Limited   HPI Crystal [name] is a 69 y.o. female who presents to the emergency department today at the advice of her GI doctor because of swelling. The patient states that she has noticed lower extremity swelling and weight gain for the past 2 months. For the past week she has started to develop shortness of breath with exertion. The patient says that she was on lasix but her GI doctor took her off of the lasix two days ago. She does have some discomfort in her legs at night. She denies any skin change. Denies any chest pain or fever. States that she underwent echocardiogram prior to presenting to the emergency department.   Records reviewed. Per medical record review patient has a history of pulmonary nodules, CHF.   Past Medical History:  Diagnosis Date  . Acute GI bleeding 02/09/2018  . Acute upper gastrointestinal bleeding 08/03/2018  . Anemia    TAKES IRON TAB  . Arthritis    SHOULDER  . B12 deficiency 02/18/2018  . Bleeding    GI  3/19  . Bronchitis    HX OF  . CHF (congestive heart failure) ([place])   . Cirrhosis ([place])   . Colon cancer screening   . Diabetes mellitus without complication ([place])    TYPE 2  . Fatty (change of) liver, not elsewhere classified   . Full dentures    UPPER AND LOWER  . Iron deficiency anemia due to chronic blood loss 02/18/2018  . Lung nodule, multiple   . Pernicious anemia 02/22/2018  . PUD (peptic ulcer disease)   . Raynaud's syndrome   . Upper GI bleeding 08/03/2018    Patient Active Problem List   Diagnosis Date Noted  . Pulmonary nodules 05/02/2020  . Hepatic encephalopathy ([place]) 04/23/2020  . Dehydration 04/23/2020  . Left ovarian cyst 10/06/2019  . Hypokalemia 08/21/2019  . AMS (altered  mental status) 08/20/2019  . Acute osteomyelitis of phalanx of hand ([place]) 06/12/2019  . Pain in finger of right hand 06/04/2019  . Type 2 diabetes mellitus without complication, without long-term current use of insulin ([place]) 04/02/2019  . Aortic atherosclerosis ([place]) 04/02/2019  . Coronary artery disease involving native coronary artery of native heart without angina pectoris 04/02/2019  . Chronic heart failure with preserved ejection fraction ([place]) 04/02/2019  . Lung nodules   . Volume overload 12/11/2018  . GAVE (gastric antral vascular ectasia)   . 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 Type 2 diabetes mellitus with stage 3 chronic kidney disease ([place]) 08/03/2018  . Hx of esophageal varices   . 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 AVM (arteriovenous malformation) of small bowel, acquired   . Adnexal cyst 03/16/2018  . Goals of care, counseling/discussion 03/16/2018  . Right middle lobe pulmonary nodule 03/14/2018  . Right lower lobe pulmonary nodule 02/18/2018  . Anemia due to chronic blood loss 02/18/2018  . B12 deficiency 02/18/2018  . 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 Hepatitis C virus infection without hepatic coma 02/18/2018    Past Surgical History:  Procedure Laterality Date  . CATARACT EXTRACTION W/PHACO Right 02/06/2018   Procedure: CATARACT EXTRACTION PHACO AND INTRAOCULAR LENS PLACEMENT ([place]) RIGHT DIABETIC;  Surgeon: Leandrew Koyanagi, MD;  Location: [place];  Service: Ophthalmology;  Laterality:  Right;  DIABETIC, ORAL MED  . CATARACT EXTRACTION W/PHACO Left 04/30/2018   Procedure: CATARACT EXTRACTION PHACO AND INTRAOCULAR LENS PLACEMENT (IOC);  Surgeon: Leandrew Koyanagi, MD;  Location: [facility]S;  Service: Ophthalmology;  Laterality: Left;  [place] 01:04 AP% 21.3 CDE 13.66 Fluid pack lot # [id number]  . COLONOSCOPY WITH PROPOFOL N/A 03/25/2018   Procedure: COLONOSCOPY WITH PROPOFOL;  Surgeon: Lin Landsman,  MD;  Location: [facility] ENDOSCOPY;  Service: Gastroenterology;  Laterality: N/A;  . DENTAL SURGERY     EXTRACTIONS  . ELECTROMAGNETIC NAVIGATION BROCHOSCOPY Right 01/27/2019   Procedure: ELECTROMAGNETIC NAVIGATION BRONCHOSCOPY;  Surgeon: [name], MD;  Location: [facility];  Service: Cardiopulmonary;  Laterality: Right;  . ESOPHAGOGASTRODUODENOSCOPY N/A 08/03/2018   Procedure: ESOPHAGOGASTRODUODENOSCOPY (EGD);  Surgeon: Lin Landsman, MD;  Location: [facility] ENDOSCOPY;  Service: Gastroenterology;  Laterality: N/A;  . ESOPHAGOGASTRODUODENOSCOPY (EGD) WITH PROPOFOL N/A 02/10/2018   Procedure: ESOPHAGOGASTRODUODENOSCOPY (EGD) WITH PROPOFOL;  Surgeon: Jonathon Bellows, MD;  Location: [facility] ENDOSCOPY;  Service: Gastroenterology;  Laterality: N/A;  . ESOPHAGOGASTRODUODENOSCOPY (EGD) WITH PROPOFOL N/A 03/25/2018   Procedure: ESOPHAGOGASTRODUODENOSCOPY (EGD) WITH PROPOFOL;  Surgeon: Lin Landsman, MD;  Location: [facility] ENDOSCOPY;  Service: Gastroenterology;  Laterality: N/A;  . ESOPHAGOGASTRODUODENOSCOPY (EGD) WITH PROPOFOL N/A 04/22/2018   Procedure: ESOPHAGOGASTRODUODENOSCOPY (EGD) WITH PROPOFOL with band ligation;  Surgeon: Lin Landsman, MD;  Location: [place];  Service: Gastroenterology;  Laterality: N/A;  . ESOPHAGOGASTRODUODENOSCOPY (EGD) WITH PROPOFOL N/A 06/24/2018   Procedure: ESOPHAGOGASTRODUODENOSCOPY (EGD) WITH PROPOFOL;  Surgeon: Lin Landsman, MD;  Location: [facility] ENDOSCOPY;  Service: Gastroenterology;  Laterality: N/A;  . ESOPHAGOGASTRODUODENOSCOPY (EGD) WITH PROPOFOL N/A 11/19/2018   Procedure: ESOPHAGOGASTRODUODENOSCOPY (EGD) WITH PROPOFOL;  Surgeon: Lin Landsman, MD;  Location: [facility] ENDOSCOPY;  Service: Gastroenterology;  Laterality: N/A;  . ESOPHAGOGASTRODUODENOSCOPY (EGD) WITH PROPOFOL N/A 03/08/2020   Procedure: ESOPHAGOGASTRODUODENOSCOPY (EGD) WITH PROPOFOL;  Surgeon: Lin Landsman, MD;  Location: [facility] ENDOSCOPY;  Service: Gastroenterology;  Laterality: N/A;  .  EYE SURGERY    . GIVENS CAPSULE STUDY N/A 03/29/2020   Procedure: GIVENS CAPSULE STUDY;  Surgeon: Lin Landsman, MD;  Location: [facility] ENDOSCOPY;  Service: Gastroenterology;  Laterality: N/A;  . HEMORRHOID BANDING  03/25/2018   Procedure: HEMORRHOID BANDING;  Surgeon: Lin Landsman, MD;  Location: [facility] ENDOSCOPY;  Service: Gastroenterology;;  . IR EMBO ART  VEN HEMORR LYMPH EXTRAV  INC GUIDE ROADMAPPING  08/07/2018  . IR RADIOLOGIST EVAL & MGMT  09/03/2018  . IR RADIOLOGIST EVAL & MGMT  12/17/2018  . IR RADIOLOGIST EVAL & MGMT  06/24/2019  . IR RADIOLOGIST EVAL & MGMT  01/22/2020  . IR TIPS  08/07/2018  . RADIOLOGY WITH ANESTHESIA N/A 08/07/2018   Procedure: TIPS;  Surgeon: Corrie Mckusick, DO;  Location: [place];  Service: Anesthesiology;  Laterality: N/A;  . TONSILLECTOMY      Prior to Admission medications   Medication Sig Start Date End Date Taking? Authorizing Provider  cyanocobalamin (,VITAMIN B-12,) 1000 MCG/ML injection Inject 1,000 mcg into the muscle every 30 (thirty) days.     [provider]  furosemide (LASIX) 40 MG tablet Check with your original prescriber whether you should continue to take Lasix. 04/24/20   Enzo Bi, MD  hydrOXYzine (ATARAX/VISTARIL) 10 MG tablet TAKE 1 TABLET (10 MG TOTAL) BY MOUTH 3 (THREE) TIMES DAILY AS NEEDED FOR ITCHING. 11/03/19   Lin Landsman, MD  Misc. Devices KIT Moderate compression hose 20-30 MM HG 10/14/19   Vanga, Tally Due, MD  nystatin (MYCOSTATIN/NYSTOP) powder Apply 1 g topically  as needed (rash).  05/23/18   [provider]  ondansetron (ZOFRAN) 4 MG tablet ondansetron HCl 4 mg tablet  TAKE 1 TABLET BY MOUTH EVERY 6 HOURS AS NEEDED    [provider]  OneTouch Delica Lancets 14N MISC 2 (two) times daily. 12/01/19   [provider]  [facility] ULTRA test strip 2 (two) times daily. 11/23/19   [provider]  pantoprazole (PROTONIX) 40 MG tablet Take 40 mg by mouth 2 (two) times daily. 06/29/19    [provider]  potassium chloride (KLOR-CON) 10 MEQ tablet Take 1 tablet (10 mEq total) by mouth daily. 04/24/20   Enzo Bi, MD  spironolactone (ALDACTONE) 50 MG tablet TAKE 1 TABLET BY MOUTH EVERY DAY 02/27/20   Lin Landsman, MD    Allergies Patient has no known allergies.  Family History  Problem Relation Age of Onset  . CAD Father   . Heart attack Father 64  . Cancer Maternal Uncle   . Seizures Mother     Social History Social History   Tobacco Use  . Smoking status: Never Smoker  . Smokeless tobacco: Never Used  Vaping Use  . Vaping Use: Never used  Substance Use Topics  . Alcohol use: Not Currently    Comment: No EtOH for 30 years.  Never a heavy drinker.  . Drug use: Never    Review of Systems Constitutional: No fever/chills Eyes: No visual changes. ENT: No sore throat. Cardiovascular: Denies chest pain. Respiratory: Positive for shortness of breath with exertion. Gastrointestinal: No abdominal pain.  No nausea, no vomiting.  No diarrhea.   Genitourinary: Negative for dysuria. Musculoskeletal: Positive for lower extremity swelling.  Skin: Negative for rash. Neurological: Negative for headaches, focal weakness or numbness.  ____________________________________________   PHYSICAL EXAM:  VITAL SIGNS: ED Triage Vitals  Enc Vitals Group     BP 05/27/20 1426 (!) 140/57     Pulse Rate 05/27/20 1426 60     Resp 05/27/20 1426 20     Temp 05/27/20 1426 97.7 F (36.5 C)     Temp Source 05/27/20 1426 Oral     SpO2 05/27/20 1426 96 %     Weight 05/27/20 1428 220 lb (99.8 kg)     Height 05/27/20 1428 5' 4" (1.626 m)     Head Circumference --      Peak Flow --      Pain Score 05/27/20 1427 0   Constitutional: Alert and oriented.  Eyes: Conjunctivae are normal.  ENT      Head: Normocephalic and atraumatic.      Nose: No congestion/rhinnorhea.      Mouth/Throat: Mucous membranes are moist.      Neck: No  stridor. Hematological/Lymphatic/Immunilogical: No cervical lymphadenopathy. Cardiovascular: Normal rate, regular rhythm.  No murmurs, rubs, or gallops.  Respiratory: Normal respiratory effort without tachypnea nor retractions. Breath sounds are clear and equal bilaterally. No wheezes. No crackles.  Gastrointestinal: Soft and non tender. No rebound. No guarding.  Genitourinary: Deferred Musculoskeletal: Normal range of motion in all extremities. Bilateral lower extremity edema. No erythema. Non tender to palpation. Neurologic:  Normal speech and language. No gross focal neurologic deficits are appreciated.  Skin:  Skin is warm, dry and intact. No rash noted. Psychiatric: Mood and affect are normal. Speech and behavior are normal. Patient exhibits appropriate insight and judgment.  ____________________________________________    LABS (pertinent positives/negatives)  CBC wbc 4.1, hgb 8.6, plt 125 BMP na 137, k 3.4, glu 105, cr 1.61  BNP 464.9 TSH 3.742 Trop hs 10 ____________________________________________   EKG  I, Nance Pear, attending physician, personally viewed and interpreted this EKG  EKG Time: 1434 Rate: 62 Rhythm: normal sinus rhythm Axis: normal Intervals: qtc 499 QRS: low voltage ST changes: no st elevation Impression: abnormal ekg ____________________________________________    RADIOLOGY  CXR Slight increase in right middle lobe lung nodule. Another stable nodule.  ____________________________________________   PROCEDURES  Procedures  ____________________________________________   INITIAL IMPRESSION / ASSESSMENT AND PLAN / ED COURSE  Pertinent labs & imaging results that were available during my care of the patient were reviewed by me and considered in my medical decision making (see chart for details).   Patient presented to the emergency department today because of concerns for peripheral edema.  Patient states the swelling has been going on  for the past 2 months.  On exam patient had no respiratory distress.  No crackles.  X-ray did show slightly enlarged pulmonary nodule and I discussed this with patient.  It did not show any pulmonary edema.  Blood work shows a slightly elevated BNP and normal troponin.  Kidney function appears to be more or less at patient's baseline over the past 4 months.  I discussed with Dr. Marius Ditch over the telephone.  Did discuss admission with patient as well as possible outpatient treatment with higher dose Lasix and close follow-up.  At this time patient shows outpatient follow-up.  I think this completely reasonable.  Will give patient prescription for higher Lasix dose over the next week.  ____________________________________________   FINAL CLINICAL IMPRESSION(S) / ED DIAGNOSES  Final diagnoses:  Peripheral edema     Note: This dictation was prepared with Dragon dictation. Any transcriptional errors that result from this process are unintentional     Nance Pear, MD 05/27/20 [place]

## 2020-05-27 NOTE — Discharge Instructions (Addendum)
Please take the 60 mg of lasix for the next month. While taking the 60 mg dose you do not need to take your 40 mg dose, you will restart that dose after the week of 60 mg. Please follow up closely with Dr. Marius Ditch.

## 2020-05-28 ENCOUNTER — Ambulatory Visit
Admission: RE | Admit: 2020-05-28 | Discharge: 2020-05-28 | Disposition: A | Discharge status: Home / Self Care | Payer: Medicare HMO | Source: Ambulatory Visit | Attending: Gastroenterology | Admitting: Gastroenterology

## 2020-05-28 DIAGNOSIS — I868 Varicose veins of other specified sites: Secondary | ICD-10-CM | POA: Diagnosis not present

## 2020-05-28 DIAGNOSIS — K7460 Unspecified cirrhosis of liver: Secondary | ICD-10-CM | POA: Insufficient documentation

## 2020-05-28 DIAGNOSIS — K753 Granulomatous hepatitis, not elsewhere classified: Secondary | ICD-10-CM | POA: Diagnosis not present

## 2020-05-28 DIAGNOSIS — R609 Edema, unspecified: Secondary | ICD-10-CM | POA: Insufficient documentation

## 2020-05-28 DIAGNOSIS — K7290 Hepatic failure, unspecified without coma: Secondary | ICD-10-CM | POA: Diagnosis not present

## 2020-05-28 DIAGNOSIS — I8501 Esophageal varices with bleeding: Secondary | ICD-10-CM | POA: Diagnosis not present

## 2020-05-28 DIAGNOSIS — K7689 Other specified diseases of liver: Secondary | ICD-10-CM | POA: Diagnosis not present

## 2020-05-31 ENCOUNTER — Encounter: Payer: Self-pay | Admitting: Family

## 2020-05-31 ENCOUNTER — Other Ambulatory Visit: Payer: Self-pay

## 2020-05-31 ENCOUNTER — Ambulatory Visit (INDEPENDENT_AMBULATORY_CARE_PROVIDER_SITE_OTHER): Payer: Medicare HMO | Admitting: Family

## 2020-05-31 VITALS — BP 130/68 | HR 65 | Ht 60.0 in | Wt 210.0 lb

## 2020-05-31 DIAGNOSIS — I5032 Chronic diastolic (congestive) heart failure: Secondary | ICD-10-CM | POA: Diagnosis not present

## 2020-05-31 DIAGNOSIS — I700 Atherosclerosis of aorta: Secondary | ICD-10-CM

## 2020-05-31 DIAGNOSIS — D500 Iron deficiency anemia secondary to blood loss (chronic): Secondary | ICD-10-CM | POA: Diagnosis not present

## 2020-05-31 DIAGNOSIS — R600 Localized edema: Secondary | ICD-10-CM

## 2020-05-31 DIAGNOSIS — K7460 Unspecified cirrhosis of liver: Secondary | ICD-10-CM

## 2020-05-31 MED ORDER — FUROSEMIDE 20 MG PO TABS: 60.0000 mg | ORAL_TABLET | Freq: Every day | ORAL | 2 refills | Status: DC

## 2020-05-31 MED ORDER — POTASSIUM CHLORIDE CRYS ER 10 MEQ PO TBCR: EXTENDED_RELEASE_TABLET | ORAL | 2 refills | Status: DC

## 2020-05-31 NOTE — Progress Notes (Addendum)
Office Visit    Patient Name: Crystal [name] Date of Encounter: 05/31/2020  Primary Care Provider:  Lorelee Market, MD Primary Cardiologist:  Nelva Bush, MD Electrophysiologist:  None   Chief Complaint    Crystal [name] is a 69 y.o. female with a hx of chronic diastolic heart failure, cirrhosis complicated by esophageal varices requiring endoscopic treatment and TI PS, PUD, DM2, lung nodules with nondiagnostic biopsy 01/2019, B12 deficiency, iron deficiency anemia presents today for follow-up of diastolic heart failure  Past Medical History    Past Medical History:  Diagnosis Date  . Acute GI bleeding 02/09/2018  . Acute upper gastrointestinal bleeding 08/03/2018  . Anemia    TAKES IRON TAB  . Arthritis    SHOULDER  . B12 deficiency 02/18/2018  . Bleeding    GI  3/19  . Bronchitis    HX OF  . CHF (congestive heart failure) ([place])   . Cirrhosis ([place])   . Colon cancer screening   . Diabetes mellitus without complication ([place])    TYPE 2  . Fatty (change of) liver, not elsewhere classified   . Full dentures    UPPER AND LOWER  . Iron deficiency anemia due to chronic blood loss 02/18/2018  . Lung nodule, multiple   . Pernicious anemia 02/22/2018  . PUD (peptic ulcer disease)   . Raynaud's syndrome   . Upper GI bleeding 08/03/2018   Past Surgical History:  Procedure Laterality Date  . CATARACT EXTRACTION W/PHACO Right 02/06/2018   Procedure: CATARACT EXTRACTION PHACO AND INTRAOCULAR LENS PLACEMENT ([place]) RIGHT DIABETIC;  Surgeon: Leandrew Koyanagi, MD;  Location: [place];  Service: Ophthalmology;  Laterality: Right;  DIABETIC, ORAL MED  . CATARACT EXTRACTION W/PHACO Left 04/30/2018   Procedure: CATARACT EXTRACTION PHACO AND INTRAOCULAR LENS PLACEMENT ([facility]);  Surgeon: Leandrew Koyanagi, MD;  Location: [facility];  Service: Ophthalmology;  Laterality: Left;  [place] 01:04 AP% 21.3 CDE 13.66 Fluid pack lot # [id number]  . COLONOSCOPY WITH PROPOFOL N/A 03/25/2018    Procedure: COLONOSCOPY WITH PROPOFOL;  Surgeon: Lin Landsman, MD;  Location: [facility] ENDOSCOPY;  Service: Gastroenterology;  Laterality: N/A;  . DENTAL SURGERY     EXTRACTIONS  . ELECTROMAGNETIC NAVIGATION BROCHOSCOPY Right 01/27/2019   Procedure: ELECTROMAGNETIC NAVIGATION BRONCHOSCOPY;  Surgeon: [name], MD;  Location: [facility];  Service: Cardiopulmonary;  Laterality: Right;  . ESOPHAGOGASTRODUODENOSCOPY N/A 08/03/2018   Procedure: ESOPHAGOGASTRODUODENOSCOPY (EGD);  Surgeon: Lin Landsman, MD;  Location: [facility] ENDOSCOPY;  Service: Gastroenterology;  Laterality: N/A;  . ESOPHAGOGASTRODUODENOSCOPY (EGD) WITH PROPOFOL N/A 02/10/2018   Procedure: ESOPHAGOGASTRODUODENOSCOPY (EGD) WITH PROPOFOL;  Surgeon: Jonathon Bellows, MD;  Location: [facility] ENDOSCOPY;  Service: Gastroenterology;  Laterality: N/A;  . ESOPHAGOGASTRODUODENOSCOPY (EGD) WITH PROPOFOL N/A 03/25/2018   Procedure: ESOPHAGOGASTRODUODENOSCOPY (EGD) WITH PROPOFOL;  Surgeon: Lin Landsman, MD;  Location: [facility] ENDOSCOPY;  Service: Gastroenterology;  Laterality: N/A;  . ESOPHAGOGASTRODUODENOSCOPY (EGD) WITH PROPOFOL N/A 04/22/2018   Procedure: ESOPHAGOGASTRODUODENOSCOPY (EGD) WITH PROPOFOL with band ligation;  Surgeon: Lin Landsman, MD;  Location: [place];  Service: Gastroenterology;  Laterality: N/A;  . ESOPHAGOGASTRODUODENOSCOPY (EGD) WITH PROPOFOL N/A 06/24/2018   Procedure: ESOPHAGOGASTRODUODENOSCOPY (EGD) WITH PROPOFOL;  Surgeon: Lin Landsman, MD;  Location: [facility] ENDOSCOPY;  Service: Gastroenterology;  Laterality: N/A;  . ESOPHAGOGASTRODUODENOSCOPY (EGD) WITH PROPOFOL N/A 11/19/2018   Procedure: ESOPHAGOGASTRODUODENOSCOPY (EGD) WITH PROPOFOL;  Surgeon: Lin Landsman, MD;  Location: [facility] ENDOSCOPY;  Service: Gastroenterology;  Laterality: N/A;  . ESOPHAGOGASTRODUODENOSCOPY (EGD) WITH PROPOFOL N/A 03/08/2020   Procedure: ESOPHAGOGASTRODUODENOSCOPY (EGD)  WITH PROPOFOL;  Surgeon: Lin Landsman, MD;   Location: [facility] ENDOSCOPY;  Service: Gastroenterology;  Laterality: N/A;  . EYE SURGERY    . GIVENS CAPSULE STUDY N/A 03/29/2020   Procedure: GIVENS CAPSULE STUDY;  Surgeon: Lin Landsman, MD;  Location: [facility] ENDOSCOPY;  Service: Gastroenterology;  Laterality: N/A;  . HEMORRHOID BANDING  03/25/2018   Procedure: HEMORRHOID BANDING;  Surgeon: Lin Landsman, MD;  Location: [facility] ENDOSCOPY;  Service: Gastroenterology;;  . IR EMBO ART  VEN HEMORR LYMPH EXTRAV  INC GUIDE ROADMAPPING  08/07/2018  . IR RADIOLOGIST EVAL & MGMT  09/03/2018  . IR RADIOLOGIST EVAL & MGMT  12/17/2018  . IR RADIOLOGIST EVAL & MGMT  06/24/2019  . IR RADIOLOGIST EVAL & MGMT  01/22/2020  . IR TIPS  08/07/2018  . RADIOLOGY WITH ANESTHESIA N/A 08/07/2018   Procedure: TIPS;  Surgeon: Corrie Mckusick, DO;  Location: [place];  Service: Anesthesiology;  Laterality: N/A;  . TONSILLECTOMY      Allergies  No Known Allergies  History of Present Illness    Crystal [name] is a 69 y.o. female with a hx of chronic diastolic heart failure, cirrhosis complicated by esophageal varices requiring endoscopic treatment and TI PS, PUD, DM2, lung nodules with nondiagnostic biopsy 01/2019, B12 deficiency, iron deficiency anemia last seen 04/14/20.  She has had coronary artery calcification and aortic atherosclerosis incidentally noted on prior CTs.  No symptoms suggestive of CAD.  Echo 04/04/2019 LVEF 55 to 60%, mild LVH, LV diastolic parameters consistent with impaired relaxation, RV normal size and function, no significant valvular abnormalities, mild dilation of aortic root 3.5 cm.  Clinic visit 12/2019 recommended to increase Lasix to 75m until weight returned to baseline as she was 6 pounds up from previous visit. Noted stable DOE and mild leg swelling. Underwent EGD 03/08/2020 with GI and capsule study 03/29/2020.  Follows with oncology for B12 deficiency and iron deficiency anemia. Had lower extremity duplex 03/30/20 ordered by oncology due to LE  edema with no evidence of acute DVT bilaterally. Noted chronic DVT, calcified, at the junction of distal common femoral vein and origin of the femoral vein with diminished flow focally at this site. As it was chronic, not recommended for anticoagulation especially in setting of anemia.   Clinic visit 04/14/20 reported increased swelling and abdominal bloating for 2 weeks. She was 9 lbs over her dry weight. Recommended to increase to Lasix 674mfor one week.   Seen 04/21/20 and was down 7 lbs. she was recommended to take Lasix 60 mg and 40 mg on alternating days.  Admitted 04/22/20-04/24/20 with hepatic encephalopathy, initial ammonia level 65 Her lactulose was increased to TID. Her Metformin was discontinued due to hyopglycemia and A1c of 5.7. 05/25/20 labs with GI showed worsening renal function and her diuretics were stopped.  Echo 05/27/20 with normal LVEF and grade 2 diastolic dysfunction, RV mildly enlarged, no significant valvular abnormalities. She called the office on 05/27/20 noting weight gain was recommended to proceed to the ED for evaluation.  She was seen in the ED with BNP of 464.9, creatinine 1.61, GFR 33, hemoglobin 8.6.  Chest x-ray with slight increase in size of irregular right middle lung nodule/mass compared to prior CT with recommendation for follow-up CT chest.  Ultrasound of the liver with widely patent TIPS with normal velocities and directional flow.  She was sent home with recommendation for Lasix 60 mg daily.  Compared to clinic visit 04/21/2020 she is up 26 pounds.  She endorses  eating a low-salt diet.  She drinks less than 2 L of fluid per day.  We reviewed her echocardiogram and that her grade 2 diastolic dysfunction as well as her cirrhosis are contributory to fluid retention.  She has upcoming appointment with nephrology next Tuesday to establish care.  She has been taking her Lasix 40 mg in the morning and 20 mg in the afternoon.  We discussed that she would likely respond more  effectively to taking the entire dose at one time.  She did not increase her potassium to 15 mEq every other day as she was having some cramping in her hands.  She endorses lower extremity edema, hand swelling, abdominal bloating.  Reports increased dyspnea on exertion but no shortness of breath at rest.  Her orthopnea is stable at baseline.  She has upcoming follow-up with GI tomorrow.  EKGs/Labs/Other Studies Reviewed:   The following studies were reviewed today:  LE duplex 03/30/20 IMPRESSION: 1. No evidence of deep venous thrombosis in either lower extremity in the right lower extremity.   2. Apparent chronic deep venous thrombosis, calcified, at the junction of the distal common femoral vein and origin of the femoral vein with diminished flow focally at this site. No acute appearing deep venous thrombosis seen on the left. No other site of chronic  Echo 03/2019 1. The left ventricle has normal systolic function, with an ejection  fraction of 55-60%. The cavity size was normal. There is mildly increased  left ventricular wall thickness. Left ventricular diastolic Doppler  parameters are consistent with impaired  relaxation.   2. The right ventricle has normal systolic function. The cavity was  normal. There is no increase in right ventricular wall thickness. Unable  to estimate RVSP   3. Left atrial size was mildly dilated.   4. There is dilatation of the aortic root 3.5 cm   EKG:  EKG is ordered today.  The ekg ordered today demonstrates SR 65 bpm with low voltage QRS and no acute ST/T wave changes.  Recent Labs: 08/21/2019: Magnesium 1.8 04/22/2020: ALT 16 05/27/2020: B Natriuretic Peptide 464.9; BUN 15; Creatinine, Ser 1.61; Hemoglobin 8.6; Platelets 125; Potassium 3.4; Sodium 137; TSH 3.742  Recent Lipid Panel    Component Value Date/Time   TRIG 163 (H) 08/03/2018 2152    Home Medications   Current Meds  Medication Sig  . cyanocobalamin (,VITAMIN B-12,) 1000 MCG/ML  injection Inject 1,000 mcg into the muscle every 30 (thirty) days.   . furosemide (LASIX) 20 MG tablet Take 3 tablets (60 mg total) by mouth daily for 7 days.  . hydrOXYzine (ATARAX/VISTARIL) 10 MG tablet TAKE 1 TABLET (10 MG TOTAL) BY MOUTH 3 (THREE) TIMES DAILY AS NEEDED FOR ITCHING.  . nystatin (MYCOSTATIN/NYSTOP) powder Apply 1 g topically as needed (rash).   . ondansetron (ZOFRAN) 4 MG tablet ondansetron HCl 4 mg tablet  TAKE 1 TABLET BY MOUTH EVERY 6 HOURS AS NEEDED  . OneTouch Delica Lancets 48N MISC 2 (two) times daily.  Glory Rosebush ULTRA test strip 2 (two) times daily.  . pantoprazole (PROTONIX) 40 MG tablet Take 40 mg by mouth 2 (two) times daily.  . potassium chloride (KLOR-CON) 10 MEQ tablet Take 1 tablet (10 mEq total) by mouth daily.  Marland Kitchen spironolactone (ALDACTONE) 50 MG tablet TAKE 1 TABLET BY MOUTH EVERY DAY    Review of Systems   Review of Systems  Constitutional: Negative for chills, fever and malaise/fatigue.  Cardiovascular: Positive for dyspnea on exertion and leg swelling. Negative  for chest pain, near-syncope, orthopnea, palpitations and syncope.  Respiratory: Negative for cough, shortness of breath and wheezing.   Gastrointestinal: Positive for bloating. Negative for nausea and vomiting.  Neurological: Negative for dizziness, light-headedness and weakness.   All other systems reviewed and are otherwise negative except as noted above.  Physical Exam    VS:  BP 130/68 (BP Location: Left Arm, Patient Position: Sitting, Cuff Size: Normal)   Pulse 65   Ht 5' (1.524 m)   Wt 210 lb (95.3 kg)   LMP  (LMP Unknown)   HC 3" (7.6 cm)   BMI 41.01 kg/m  , BMI Body mass index is 41.01 kg/m. GEN: Well nourished, well developed, in no acute distress. HEENT: normal. Neck: Supple, no JVD, carotid bruits, or masses. Cardiac: RRR, no murmurs, rubs, or gallops. No clubbing, cyanosis. Bilateral LE 2+ pitting edema. Fingers of bilateral hands with mild swelling. Radials/PT 2+ and  equal bilaterally.  Respiratory:  Respirations regular and unlabored, clear to auscultation bilaterally. GI: Soft, nontender, nondistended MS: No deformity or atrophy. Skin: Warm and dry, no rash. Neuro:  Strength and sensation are intact. Psych: Normal affect.  Assessment & Plan    1. Chronic diastolic heart failure - Echo 05/27/20 normal LVEF 60-65% and gr2DD with mildly dilated RV. Weight up 26lbs from clinic visit 6 weeks ago. In the interim, her Lasix has been reduced 05/25/20 due to impaired renal function and resumed 05/27/20 at ED visit for edema. Bilateral LE 2+ edema. Fluid retention multifactorial diastolic heart failure, anemia, cirrhosis. She has been taking Lasix 67m AM and 22mPM after recent ED visit. Recommend Lasix 6056maily. Continue Potassium 85m61mnd 15mE67m alternating days. May require transition to Torsemide if we cannot control volume status with Lasix. Continue Spironolactone 50mg 82my per GI. She politely declines bloodwork today as she just had bloodwork 3 days ago in ED and is a hard stick (05/27/20 K 3.4, creatinine 1.61, GFR 33, BNP 464.9). Anticipate repeat BMP with GI tomorrow or nephrology next week. Nephrology recommendations regarding diuresis appreciated. She will call for weight gain of 2lb overnight or 5lb in 1 week.   2. Iron deficiency anemia/B12 deficiency - Follows with Dr. CorcorMike Gip. 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 No indication for anticoagulation as DVT is well calcified. No indication for referral to vascular surgery. LE edema primarily diastolic heart failure, cirrhosis, venous insufficiency in etiology. Changes to diuretic, as above. Recommend elevating lower extremities when sitting, low sodium diet, compression stockings.   4. Coronary artery calcification on CT and aortic atherosclerosis - No anginal symptoms. No indication for ischemic evaluation  at this time. 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   5. Cirrhosis -follows with GI. Appointment with Dr. Vanga Marius Ditchrow.  6. CKDIIIb - Careful titration of diuretics.   Disposition: Follow up in 6-8 weeks with Dr. End orSaunders RevelP.    CaitliLoel Dubonnet/19/2021, 10:24 AM

## 2020-05-31 NOTE — Patient Instructions (Signed)
Medication Instructions:  Your physician has recommended you make the following change in your medication:   CONTINUE Potassium alternating 45mq and 190m  CHANGE Furosemide to 601mthree tablets) daily in the morning. Take all of your fluid pills together at one time for most effective dose.   *If you need a refill on your cardiac medications before your next appointment, please call your pharmacy*   Lab Work: None today.  Testing/Procedures: Your EKG today showed normal sinus rhythm.   Your echocardiogram showed that your heart pumping function was normal, but your heart was stiff.   Follow-Up: [facility] and your health needs are our priority.  As part of our continuing mission to provide you with exceptional heart care, we have created designated Provider Care Teams.  These Care Teams include your primary Cardiologist (physician) and Advanced Practice Providers (APPs -  Physician Assistants and Nurse Practitioners) who all work together to provide you with the care you need, when you need it.  We recommend signing up for the patient portal called "MyChart".  Sign up information is provided on this After Visit Summary.  MyChart is used to connect with patients for Virtual Visits (Telemedicine).  Patients are able to view lab/test results, encounter notes, upcoming appointments, etc.  Non-urgent messages can be sent to your provider as well.   To learn more about what you can do with MyChart, go to [email or web address]  Your next appointment:   2 month(s)  The format for your next appointment:   In Person  Provider:   You may see ChrNelva BushD or one of the following Advanced Practice Providers on your designated Care Team:    ChrMurray HodgkinsP  RyaChristell FaithA-C  JacMarrianne MoodA-C  CaiLaurann MontanaP  Other Instructions  Weigh yourself every day and keep a log. If your weight goes up by 2 pounds overnight or 5 pounds in one week, please  call our office.   Continue to drink less than 2 liters of fluid per day.  Continue low salt diet.

## 2020-06-01 ENCOUNTER — Other Ambulatory Visit: Payer: Self-pay

## 2020-06-01 ENCOUNTER — Encounter: Payer: Self-pay | Admitting: Gastroenterology

## 2020-06-01 ENCOUNTER — Ambulatory Visit (INDEPENDENT_AMBULATORY_CARE_PROVIDER_SITE_OTHER): Payer: Medicare HMO | Admitting: Gastroenterology

## 2020-06-01 VITALS — BP 147/73 | HR 69 | Temp 97.9°F | Wt 209.1 lb

## 2020-06-01 DIAGNOSIS — E8770 Fluid overload, unspecified: Secondary | ICD-10-CM | POA: Diagnosis not present

## 2020-06-01 DIAGNOSIS — K7460 Unspecified cirrhosis of liver: Secondary | ICD-10-CM | POA: Diagnosis not present

## 2020-06-01 DIAGNOSIS — K7290 Hepatic failure, unspecified without coma: Secondary | ICD-10-CM | POA: Diagnosis not present

## 2020-06-01 MED ORDER — SPIRONOLACTONE 100 MG PO TABS
100.0000 mg | ORAL_TABLET | Freq: Every day | ORAL | 0 refills | Status: DC
Start: 2020-06-01 — End: 2020-06-22

## 2020-06-01 NOTE — Patient Instructions (Signed)
Increase your Spironolactone to 187m daily  Low-Sodium Eating Plan Sodium, which is an element that makes up salt, helps you maintain a healthy balance of fluids in your body. Too much sodium can increase your blood pressure and cause fluid and waste to be held in your body. Your health care provider or dietitian may recommend following this plan if you have high blood pressure (hypertension), kidney disease, liver disease, or heart failure. Eating less sodium can help lower your blood pressure, reduce swelling, and protect your heart, liver, and kidneys. What are tips for following this plan? General guidelines  Most people on this plan should limit their sodium intake to 1,500-2,000 mg (milligrams) of sodium each day. Reading food labels   The Nutrition Facts label lists the amount of sodium in one serving of the food. If you eat more than one serving, you must multiply the listed amount of sodium by the number of servings.  Choose foods with less than 140 mg of sodium per serving.  Avoid foods with 300 mg of sodium or more per serving. Shopping  Look for lower-sodium products, often labeled as "low-sodium" or "no salt added."  Always check the sodium content even if foods are labeled as "unsalted" or "no salt added".  Buy fresh foods. ? Avoid canned foods and premade or frozen meals. ? Avoid canned, cured, or processed meats  Buy breads that have less than 80 mg of sodium per slice. Cooking  Eat more home-cooked food and less restaurant, buffet, and fast food.  Avoid adding salt when cooking. Use salt-free seasonings or herbs instead of table salt or sea salt. Check with your health care provider or pharmacist before using salt substitutes.  Cook with plant-based oils, such as canola, sunflower, or olive oil. Meal planning  When eating at a restaurant, ask that your food be prepared with less salt or no salt, if possible.  Avoid foods that contain MSG (monosodium glutamate).  MSG is sometimes added to [place], bouillon, and some canned foods. What foods are recommended? The items listed may not be a complete list. Talk with your dietitian about what dietary choices are best for you. Grains Low-sodium cereals, including oats, puffed wheat and rice, and shredded wheat. Low-sodium crackers. Unsalted rice. Unsalted pasta. Low-sodium bread. Whole-grain breads and whole-grain pasta. Vegetables Fresh or frozen vegetables. "No salt added" canned vegetables. "No salt added" tomato sauce and paste. Low-sodium or reduced-sodium tomato and vegetable juice. Fruits Fresh, frozen, or canned fruit. Fruit juice. Meats and other protein foods Fresh or frozen (no salt added) meat, poultry, seafood, and fish. Low-sodium canned tuna and salmon. Unsalted nuts. Dried peas, beans, and lentils without added salt. Unsalted canned beans. Eggs. Unsalted nut butters. Dairy Milk. Soy milk. Cheese that is naturally low in sodium, such as ricotta cheese, fresh mozzarella, or Swiss cheese Low-sodium or reduced-sodium cheese. Cream cheese. Yogurt. Fats and oils Unsalted butter. Unsalted margarine with no trans fat. Vegetable oils such as canola or olive oils. Seasonings and other foods Fresh and dried herbs and spices. Salt-free seasonings. Low-sodium mustard and ketchup. Sodium-free salad dressing. Sodium-free light mayonnaise. Fresh or refrigerated horseradish. Lemon juice. Vinegar. Homemade, reduced-sodium, or low-sodium soups. Unsalted popcorn and pretzels. Low-salt or salt-free chips. What foods are not recommended? The items listed may not be a complete list. Talk with your dietitian about what dietary choices are best for you. Grains Instant hot cereals. Bread stuffing, pancake, and biscuit mixes. Croutons. Seasoned rice or pasta mixes. Noodle soup cups. Boxed  or frozen macaroni and cheese. Regular salted crackers. Self-rising flour. Vegetables Sauerkraut, pickled vegetables, and  relishes. Olives. [place] fries. Onion rings. Regular canned vegetables (not low-sodium or reduced-sodium). Regular canned tomato sauce and paste (not low-sodium or reduced-sodium). Regular tomato and vegetable juice (not low-sodium or reduced-sodium). Frozen vegetables in sauces. Meats and other protein foods Meat or fish that is salted, canned, smoked, spiced, or pickled. Bacon, ham, sausage, hotdogs, corned beef, chipped beef, packaged lunch meats, salt pork, jerky, pickled herring, anchovies, regular canned tuna, sardines, salted nuts. Dairy Processed cheese and cheese spreads. Cheese curds. Blue cheese. Feta cheese. String cheese. Regular cottage cheese. Buttermilk. Canned milk. Fats and oils Salted butter. Regular margarine. Ghee. Bacon fat. Seasonings and other foods Onion salt, garlic salt, seasoned salt, table salt, and sea salt. Canned and packaged gravies. Worcestershire sauce. Tartar sauce. Barbecue sauce. Teriyaki sauce. Soy sauce, including reduced-sodium. Steak sauce. Fish sauce. Oyster sauce. Cocktail sauce. Horseradish that you find on the shelf. Regular ketchup and mustard. Meat flavorings and tenderizers. Bouillon cubes. Hot sauce and Tabasco sauce. Premade or packaged marinades. Premade or packaged taco seasonings. Relishes. Regular salad dressings. Salsa. Potato and tortilla chips. Corn chips and puffs. Salted popcorn and pretzels. Canned or dried soups. Pizza. Frozen entrees and pot pies. Summary  Eating less sodium can help lower your blood pressure, reduce swelling, and protect your heart, liver, and kidneys.  Most people on this plan should limit their sodium intake to 1,500-2,000 mg (milligrams) of sodium each day.  Canned, boxed, and frozen foods are high in sodium. Restaurant foods, fast foods, and pizza are also very high in sodium. You also get sodium by adding salt to food.  Try to cook at home, eat more fresh fruits and vegetables, and eat less fast food, canned,  processed, or prepared foods. This information is not intended to replace advice given to you by your health care provider. Make sure you discuss any questions you have with your health care provider. Document Revised: 10/12/2017 Document Reviewed: 10/23/2016 Elsevier Patient Education  2020 [employer].

## 2020-06-01 NOTE — Progress Notes (Signed)
[name], MD [address]  [place]  [place], [place] [address]  Main: [phone]  Fax: [phone]    Gastroenterology Consultation  Referring Provider:     Lorelee Market, MD Primary Care Physician:  Lorelee Market, MD Primary Gastroenterologist:  Dr. [name] Reason for Consultation:     Decompensated cirrhosis        HPI:   Crystal [name] is a 69 y.o. female referred by Dr. Lorelee Market, MD  for consultation & management of decompensated cirrhosis.   Follow-up visit 06/26/2019 Patient was told to make a follow-up to see me to discuss about abnormal labs by her PCP.  I reviewed the labs from 06/12/2019 which revealed normal hemoglobin, platelets 110, normal BUN/creatinine, total bilirubin 2, alkaline phosphatase 170.  She had repeat labs at Dr. Pasty Arch office in [place] on 8/3, alkaline phosphatase 173, AST 60, total bilirubin 1.5, creatinine 1.18.  Patient reports she has been doing very well.  She denies any swelling of legs, dark urine, pruritus, abdominal pain, melena.  She feels she has remained the same from liver standpoint.  She is requesting lactulose for 3 months supply  She is single, lives with a roommate  Follow-up visit 05/03/2020 Patient was recently admitted to [facility] on 6/12 secondary to increasing fatigue.  Her hemoglobin A1c was low and she had hypoglycemic episode.  Therefore, antidiabetic medications were discontinued.  Her hemoglobin was 8.1 during that admission.  Patient is here for follow-up of decompensated cirrhosis.  She reports increasing fatigue.  She says she has not been able to perform her daily activities like she used to do before.  She reports mild swelling of her feet.  She denies any weight gain.She denies fever, chills, abdominal pain, nausea or vomiting, hematemesis, rectal bleeding, hematochezia.  She denies black stools or melena.  She underwent upper endoscopy which revealed gastric AVMs that were cauterized.   Subsequently, underwent video capsule endoscopy which was unremarkable.  She does have progressively worsening anemia without active GI bleed. She is compliant with her diuretics, lactulose, rifaximin.  She is also noted to have declining renal function. She does report nocturnal pruritus for which she takes hydroxyzine 10 mg every 5 hours at night  Follow-up visit 06/01/2020 Patient made an urgent visit secondary to volume overload within last 4 weeks.  She gained more than 20 pounds in last few weeks.  She noticed significant swelling of legs extending to upper thighs as well as exertional dyspnea.  She reports that she has been taking diuretics however her renal function was gradually getting worse.  Therefore I held diuretics.  She also acknowledges that she eats out almost daily.  She does eat hamburger with pickle on a regular basis as well.  She went to ER last week.  Labs revealed potassium 3.4, slightly improved creatinine 1.6 from 1.7, normal BUN, hemoglobin 8.6 which is at baseline, elevated BNP 464.9, TSH normal She denies abdominal pain, fever, chills, nausea or vomiting.  Diuretics were restarted, Lasix increased to 60 mg daily along with spironolactone 50 mg daily  NSAIDs: none including BC powder or Goody powder  Antiplts/Anticoagulants/Anti thrombotics: none  GI Procedures:  Video capsule endoscopy 03/29/2020 Capsule study results:  Non specific no bleeding tiny erythematous spots in small bowel. If patient develops worse anemia recommend a push enteroscopy   EGD 03/08/2020 - A few non-bleeding angioectasias in the duodenum. Treated with argon plasma coagulation (APC). - Gastric antral vascular ectasia with bleeding. Treated with  argon plasma coagulation (APC). - A single non-bleeding angioectasia in the stomach. Treated with bipolar cautery. - Normal gastroesophageal junction and esophagus. - No specimens collected.  EGD 11/19/2018 - Normal duodenal bulb and second portion of the  duodenum. - Gastric antral vascular ectasia with bleeding. Treated with argon plasma coagulation (APC). - Normal gastroesophageal junction and esophagus. - No specimens collected.  EGD 08/03/2018 - Normal duodenal bulb and second portion of the duodenum. - Portal hypertensive gastropathy. - Bleeding large (> 5 mm) esophageal varices, ligation was unsuccessful. - No specimens collected.  EGD 04/22/2018 - Normal duodenal bulb and second portion of the duodenum. - Non-bleeding gastric ulcer with a clean ulcer base (Forrest Class III). - Portal hypertensive gastropathy. - Large (> 5 mm) esophageal varices. Incompletely eradicated. Banded x 2. - No specimens collected.  EGD 03/25/2018 - Two non-bleeding angioectasias in the duodenum. Treated with argon plasma coagulation (APC). - A few non-bleeding angioectasias in the stomach. Treated with argon plasma coagulation (APC). - Non-bleeding gastric ulcers with a clean ulcer base (Forrest Class III). - Non-bleeding large (> 5 mm) esophageal varices. Incompletely eradicated. Banded. - No specimens collected.   Colonoscopy 03/25/2018 - One 5 mm polyp in the ascending colon, removed with a cold snare. Resected and retrieved. - Rectal varices. - External hemorrhoids. - The examination was otherwise normal.  DIAGNOSIS:  A. COLON POLYP, ASCENDING; COLD SNARE:  - TUBULAR ADENOMA.  - NEGATIVE FOR HIGH-GRADE DYSPLASIA AND MALIGNANCY.  EGD 02/09/18 The examined duodenum was normal. The stomach was normal. Five columns of non-bleeding grade III varices were found in the lower third of the esophagus,. Stigmata of recent bleeding were evident and red wale signs were present. Ten bands were successfully placed with incomplete eradication of varices. There was no bleeding during, and at the end, of the procedure.  Past Medical History:  Diagnosis Date  . Acute GI bleeding 02/09/2018  . Acute upper gastrointestinal bleeding 08/03/2018  . Anemia     TAKES IRON TAB  . Arthritis    SHOULDER  . B12 deficiency 02/18/2018  . Bleeding    GI  3/19  . Bronchitis    HX OF  . CHF (congestive heart failure) ([place])   . Cirrhosis ([place])   . Colon cancer screening   . Diabetes mellitus without complication ([place])    TYPE 2  . Fatty (change of) liver, not elsewhere classified   . Full dentures    UPPER AND LOWER  . Iron deficiency anemia due to chronic blood loss 02/18/2018  . Lung nodule, multiple   . Pernicious anemia 02/22/2018  . PUD (peptic ulcer disease)   . Raynaud's syndrome   . Upper GI bleeding 08/03/2018    Past Surgical History:  Procedure Laterality Date  . CATARACT EXTRACTION W/PHACO Right 02/06/2018   Procedure: CATARACT EXTRACTION PHACO AND INTRAOCULAR LENS PLACEMENT (Tool) RIGHT DIABETIC;  Surgeon: Leandrew Koyanagi, MD;  Location: [place];  Service: Ophthalmology;  Laterality: Right;  DIABETIC, ORAL MED  . CATARACT EXTRACTION W/PHACO Left 04/30/2018   Procedure: CATARACT EXTRACTION PHACO AND INTRAOCULAR LENS PLACEMENT (IOC);  Surgeon: Leandrew Koyanagi, MD;  Location: [facility];  Service: Ophthalmology;  Laterality: Left;  [place] 01:04 AP% 21.3 CDE 13.66 Fluid pack lot # [id number]  . COLONOSCOPY WITH PROPOFOL N/A 03/25/2018   Procedure: COLONOSCOPY WITH PROPOFOL;  Surgeon: Lin Landsman, MD;  Location: [facility] ENDOSCOPY;  Service: Gastroenterology;  Laterality: N/A;  . DENTAL SURGERY     EXTRACTIONS  . ELECTROMAGNETIC NAVIGATION  BROCHOSCOPY Right 01/27/2019   Procedure: ELECTROMAGNETIC NAVIGATION BRONCHOSCOPY;  Surgeon: [name], MD;  Location: [facility];  Service: Cardiopulmonary;  Laterality: Right;  . ESOPHAGOGASTRODUODENOSCOPY N/A 08/03/2018   Procedure: ESOPHAGOGASTRODUODENOSCOPY (EGD);  Surgeon: Lin Landsman, MD;  Location: [facility] ENDOSCOPY;  Service: Gastroenterology;  Laterality: N/A;  . ESOPHAGOGASTRODUODENOSCOPY (EGD) WITH PROPOFOL N/A 02/10/2018   Procedure: ESOPHAGOGASTRODUODENOSCOPY (EGD) WITH  PROPOFOL;  Surgeon: Jonathon Bellows, MD;  Location: [facility] ENDOSCOPY;  Service: Gastroenterology;  Laterality: N/A;  . ESOPHAGOGASTRODUODENOSCOPY (EGD) WITH PROPOFOL N/A 03/25/2018   Procedure: ESOPHAGOGASTRODUODENOSCOPY (EGD) WITH PROPOFOL;  Surgeon: Lin Landsman, MD;  Location: [facility] ENDOSCOPY;  Service: Gastroenterology;  Laterality: N/A;  . ESOPHAGOGASTRODUODENOSCOPY (EGD) WITH PROPOFOL N/A 04/22/2018   Procedure: ESOPHAGOGASTRODUODENOSCOPY (EGD) WITH PROPOFOL with band ligation;  Surgeon: Lin Landsman, MD;  Location: [place];  Service: Gastroenterology;  Laterality: N/A;  . ESOPHAGOGASTRODUODENOSCOPY (EGD) WITH PROPOFOL N/A 06/24/2018   Procedure: ESOPHAGOGASTRODUODENOSCOPY (EGD) WITH PROPOFOL;  Surgeon: Lin Landsman, MD;  Location: [facility] ENDOSCOPY;  Service: Gastroenterology;  Laterality: N/A;  . ESOPHAGOGASTRODUODENOSCOPY (EGD) WITH PROPOFOL N/A 11/19/2018   Procedure: ESOPHAGOGASTRODUODENOSCOPY (EGD) WITH PROPOFOL;  Surgeon: Lin Landsman, MD;  Location: [facility] ENDOSCOPY;  Service: Gastroenterology;  Laterality: N/A;  . ESOPHAGOGASTRODUODENOSCOPY (EGD) WITH PROPOFOL N/A 03/08/2020   Procedure: ESOPHAGOGASTRODUODENOSCOPY (EGD) WITH PROPOFOL;  Surgeon: Lin Landsman, MD;  Location: [facility] ENDOSCOPY;  Service: Gastroenterology;  Laterality: N/A;  . EYE SURGERY    . GIVENS CAPSULE STUDY N/A 03/29/2020   Procedure: GIVENS CAPSULE STUDY;  Surgeon: Lin Landsman, MD;  Location: [facility] ENDOSCOPY;  Service: Gastroenterology;  Laterality: N/A;  . HEMORRHOID BANDING  03/25/2018   Procedure: HEMORRHOID BANDING;  Surgeon: Lin Landsman, MD;  Location: [facility] ENDOSCOPY;  Service: Gastroenterology;;  . IR EMBO ART  VEN HEMORR LYMPH EXTRAV  INC GUIDE ROADMAPPING  08/07/2018  . IR RADIOLOGIST EVAL & MGMT  09/03/2018  . IR RADIOLOGIST EVAL & MGMT  12/17/2018  . IR RADIOLOGIST EVAL & MGMT  06/24/2019  . IR RADIOLOGIST EVAL & MGMT  01/22/2020  . IR TIPS  08/07/2018  . RADIOLOGY WITH  ANESTHESIA N/A 08/07/2018   Procedure: TIPS;  Surgeon: Corrie Mckusick, DO;  Location: [place];  Service: Anesthesiology;  Laterality: N/A;  . TONSILLECTOMY       Current Outpatient Medications:  .  albuterol (VENTOLIN HFA) 108 (90 Base) MCG/ACT inhaler, Inhale 2 puffs into the lungs every 4 (four) hours as needed., Disp: , Rfl:  .  Cholecalciferol (VITAMIN D3) 1.25 MG (50000 UT) CAPS, Take 1 capsule by mouth once a week., Disp: , Rfl:  .  cyanocobalamin (,VITAMIN B-12,) 1000 MCG/ML injection, Inject 1,000 mcg into the muscle every 30 (thirty) days. , Disp: , Rfl:  .  furosemide (LASIX) 20 MG tablet, Take 3 tablets (60 mg total) by mouth daily., Disp: 90 tablet, Rfl: 2 .  hydrOXYzine (ATARAX/VISTARIL) 10 MG tablet, TAKE 1 TABLET (10 MG TOTAL) BY MOUTH 3 (THREE) TIMES DAILY AS NEEDED FOR ITCHING., Disp: 30 tablet, Rfl: 0 .  hydrOXYzine (ATARAX/VISTARIL) 25 MG tablet, Take 25 mg by mouth 3 (three) times daily., Disp: , Rfl:  .  nystatin (MYCOSTATIN/NYSTOP) powder, Apply 1 g topically as needed (rash). , Disp: , Rfl:  .  ondansetron (ZOFRAN) 4 MG tablet, ondansetron HCl 4 mg tablet  TAKE 1 TABLET BY MOUTH EVERY 6 HOURS AS NEEDED, Disp: , Rfl:  .  OneTouch Delica Lancets 40J MISC, 2 (two) times daily., Disp: , Rfl:  .  ONETOUCH ULTRA test  strip, 2 (two) times daily., Disp: , Rfl:  .  pantoprazole (PROTONIX) 40 MG tablet, Take 40 mg by mouth 2 (two) times daily., Disp: , Rfl:  .  potassium chloride (KLOR-CON) 10 MEQ tablet, Take 10 mg  (1 tablet daily) daily alternating daysTake 15 mg (1 and 1/2 tablet) daily, Disp: 37.5 tablet, Rfl: 2 .  spironolactone (ALDACTONE) 50 MG tablet, TAKE 1 TABLET BY MOUTH EVERY DAY, Disp: 90 tablet, Rfl: 0 .  Misc. Devices KIT, Moderate compression hose 20-30 MM HG (Patient not taking: Reported on 06/01/2020), Disp: 1 kit, Rfl: 0 .  spironolactone (ALDACTONE) 100 MG tablet, Take 1 tablet (100 mg total) by mouth daily., Disp: 30 tablet, Rfl: 0   Family History  Problem  Relation Age of Onset  . CAD Father   . Heart attack Father 54  . Cancer Maternal Uncle   . Seizures Mother      Social History   Tobacco Use  . Smoking status: Never Smoker  . Smokeless tobacco: Never Used  Vaping Use  . Vaping Use: Never used  Substance Use Topics  . Alcohol use: Not Currently    Comment: No EtOH for 30 years.  Never a heavy drinker.  . Drug use: Never    Allergies as of 06/01/2020  . (No Known Allergies)    Review of Systems:    All systems reviewed and negative except where noted in HPI.   Physical Exam:  BP (!) 147/73 (BP Location: Left Arm, Patient Position: Sitting, Cuff Size: Normal)   Pulse 69   Temp 97.9 F (36.6 C) (Oral)   Wt 209 lb 2 oz (94.9 kg)   LMP  (LMP Unknown)   BMI 40.84 kg/m  No LMP recorded (lmp unknown). Patient is postmenopausal.  General:   Alert,  Well-developed, well-nourished, pleasant and cooperative in NAD, lethargic Head:  Normocephalic and atraumatic. Eyes:  Sclera clear, no icterus.   Conjunctiva pink. Ears:  Normal auditory acuity. Nose:  No deformity, discharge, or lesions. Mouth:  No deformity or lesions,oropharynx pink & moist. Neck:  Supple; no masses or thyromegaly. Lungs:  Respirations even and unlabored.  Clear throughout to auscultation.   No wheezes, crackles, or rhonchi. No acute distress. Heart:  Regular rate and rhythm; no murmurs, clicks, rubs, or gallops. Abdomen:  Normal bowel sounds. Soft, non-tender and nondistended, without masses, hepatosplenomegaly or hernias noted.  No guarding or rebound tenderness.   Rectal: Not performed Msk:  Symmetrical without gross deformities. muscle wasting in bilateral upper extremities Good, equal movement & strength bilaterally. Pulses:  Normal pulses noted. Extremities:  No clubbing, 3+ edema, no cyanosis Neurologic:  Alert and oriented x3;  grossly normal neurologically. Skin: telangiectasias on upper anterior chest, the rash in her bilateral groin appears like  a erythematous ring with central clearing, dry scaly margins Lymph Nodes:  No significant cervical adenopathy. Psych:  Alert and cooperative. Normal mood and affect.  Imaging Studies: Reviewed. CT A/P with contrast on 02/09/2018 revealed mildly enlarged retroperitoneal lymph nodes, largest with 11 mm concerning for metastatic disease or malignancy,18 mm spiculated density in the right lower lobe concerning for malignancy associated with small left pleural effusion  Assessment and Plan:   Crystal [name] is a 69 y.o. Caucasian female with metabolic syndrome, with decompensated cirrhosis secondary to ascites and refractory variceal bleed from esophageal varices status post TIPS and coil embolization of the left gastric vein here for follow-up  Decompensated cirrhosis: child class B, MELD-Na 9 Secondary liver disease  workup positive for HCV antibodies, but viral load not detected, anti-smooth muscle antibodies weakly positive. 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 Ceruloplasmin, antimitochondrial antibodies, hepatitis B, alpha-1 antitrypsin came back negative Portal hypertension: manifested as ascites, esophageal varices, splenomegaly.  Patient currently has no ascites since placement of TIPS, controlled on low-sodium diet S/p TIPS: Secondary to refractory variceal bleed. Follow-up with interventional radiology every 6 months.  TIPS patent Ascites/volume overload: No evidence of ascites, does have significant bilateral swelling of legs.  Diuretics were temporarily held due to worsening kidney function, creatinine slightly improved.  BNP is elevated as well.  Due to ongoing weight gain, I referred her to nephrology, has appointment to see Dr. Jesse Sans on 06/08/2020.  Continue Lasix 60 mg daily and increase spironolactone to 100 mg daily.  Reiterated on low-sodium diet, information was provided to her again today.  Suggested her to check weight daily.  Continue low-sodium diet. Avoid processed meats, and foods, chips, carbonated beverages.  Patient evaluated by cardiology, stable cardiac function, advised no further intervention at this time.  Ultrasound Dopplers revealed patent TIPS Varices: variceal bleed from esophageal varices, status post EGD on 02/10/2018, EVLx10, status post EGD 03/25/2018 EVLx4, s/p EGD 04/22/18 EVLx2 and hemospray, refractory variceal bleed 08/03/2018, status post TIPS with coil embolization of left gastric vein on 08/07/2018.  Stopped propranolol  Repeat EGD in 11/2018 revealed no varices at all, GAVE treated with APC.   Pruritus: Continue hydroxyzine 10 mg 3 times a day as needed most likely secondary to cholestasis.  Stable alkaline phosphatase, increase hydroxyzine to 20 mg if needed  Anemia: Worsening, previously was iron deficient secondary to blood loss from combination of peptic ulcer disease, gastric AVMs, portal hypertensive gastropathy, esophageal varices, GAVE.  However, her hemoglobin is declining despite iron replacement.  She does not have iron and B12 deficiency at this time.  Iron studies are consistent with anemia of chronic disease.  Likely secondary to worsening kidney function.  Will refer her to nephrology.  There is no evidence of coagulopathy.  She has mild thrombocytopenia HCC screening: AFP normal, no liver lesions on CT in 07/2018.  Repeat ultrasound in 05/2019 with no liver lesions.  Recommend MRI liver mass protocol with MRCP due to elevated alkaline phosphatase and total bilirubin PSE: Started on lactulose post TIPS.  Continue rifaximin 550 mg twice daily, long-term.  HRS: Does have declining renal function since 01/2020, GFR between 30 and 40, referral to nephrology Hepatic hydrothorax: none   Health maintenance:  She is immune to hepatitis B She received Pneumovax and influenza vaccine in 07/2018 vitamin D levels 29.2 on 03/15/2018 Avoid excess Tylenol and NSAIDs use Colon cancer  screening:underwent colonoscopy on 04/11/2018, One 5 mm polyp in the ascending colon, removed with a cold snare, Patch showed tubular adenoma with no evidence of high-grade dysplasia or malignancy. Recommend repeat colonoscopy in 5 years Right lower lobe lung lesion and retroperitoneal lymphadenopathy: follow-up with Dr. Mike Gip, no evidence of retroperitoneal lymphadenopathy. Repeat CT chest revealed that the lesions were stable.  Patient underwent bronchoscopy, sample was limited, no evidence of malignancy.  Plan to monitor the lesion with imaging for now  Follow up in 3 months   [name], MD

## 2020-06-08 ENCOUNTER — Ambulatory Visit: Payer: Medicare HMO

## 2020-06-08 ENCOUNTER — Other Ambulatory Visit: Payer: Self-pay | Admitting: Nephrology

## 2020-06-08 DIAGNOSIS — I5031 Acute diastolic (congestive) heart failure: Secondary | ICD-10-CM | POA: Diagnosis not present

## 2020-06-08 DIAGNOSIS — R600 Localized edema: Secondary | ICD-10-CM | POA: Diagnosis not present

## 2020-06-08 DIAGNOSIS — K7469 Other cirrhosis of liver: Secondary | ICD-10-CM | POA: Diagnosis not present

## 2020-06-08 DIAGNOSIS — D631 Anemia in chronic kidney disease: Secondary | ICD-10-CM | POA: Diagnosis not present

## 2020-06-08 DIAGNOSIS — R69 Illness, unspecified: Secondary | ICD-10-CM | POA: Diagnosis not present

## 2020-06-08 DIAGNOSIS — Z79899 Other long term (current) drug therapy: Secondary | ICD-10-CM | POA: Diagnosis not present

## 2020-06-08 DIAGNOSIS — N1832 Chronic kidney disease, stage 3b: Secondary | ICD-10-CM | POA: Diagnosis not present

## 2020-06-08 DIAGNOSIS — R809 Proteinuria, unspecified: Secondary | ICD-10-CM | POA: Diagnosis not present

## 2020-06-09 ENCOUNTER — Encounter: Payer: Self-pay | Admitting: Nurse Practitioner

## 2020-06-09 LAB — BASIC METABOLIC PANEL
BUN/Creatinine Ratio: 10 — ABNORMAL LOW (ref 12–28)
BUN: 18 mg/dL (ref 8–27)
CO2: 18 mmol/L — ABNORMAL LOW (ref 20–29)
Calcium: 9 mg/dL (ref 8.7–10.3)
Chloride: 106 mmol/L (ref 96–106)
Creatinine, Ser: 1.75 mg/dL — ABNORMAL HIGH (ref 0.57–1.00)
GFR calc Af Amer: 34 mL/min/{1.73_m2} — ABNORMAL LOW (ref >59)
GFR calc non Af Amer: 29 mL/min/{1.73_m2} — ABNORMAL LOW (ref >59)
Glucose: 156 mg/dL — ABNORMAL HIGH (ref 65–99)
Potassium: 3.9 mmol/L (ref 3.5–5.2)
Sodium: 139 mmol/L (ref 134–144)

## 2020-06-15 ENCOUNTER — Other Ambulatory Visit: Payer: Self-pay

## 2020-06-15 ENCOUNTER — Ambulatory Visit
Admission: RE | Admit: 2020-06-15 | Discharge: 2020-06-15 | Disposition: A | Discharge status: Home / Self Care | Payer: Medicare HMO | Source: Ambulatory Visit | Attending: Nephrology | Admitting: Nephrology

## 2020-06-15 DIAGNOSIS — N1832 Chronic kidney disease, stage 3b: Secondary | ICD-10-CM | POA: Diagnosis not present

## 2020-06-17 DIAGNOSIS — I10 Essential (primary) hypertension: Secondary | ICD-10-CM | POA: Diagnosis not present

## 2020-06-17 DIAGNOSIS — K922 Gastrointestinal hemorrhage, unspecified: Secondary | ICD-10-CM | POA: Diagnosis not present

## 2020-06-17 DIAGNOSIS — E119 Type 2 diabetes mellitus without complications: Secondary | ICD-10-CM | POA: Diagnosis not present

## 2020-06-17 DIAGNOSIS — K7460 Unspecified cirrhosis of liver: Secondary | ICD-10-CM | POA: Diagnosis not present

## 2020-06-17 DIAGNOSIS — E669 Obesity, unspecified: Secondary | ICD-10-CM | POA: Diagnosis not present

## 2020-06-22 ENCOUNTER — Other Ambulatory Visit: Payer: Self-pay | Admitting: Gastroenterology

## 2020-06-22 ENCOUNTER — Other Ambulatory Visit: Payer: Self-pay

## 2020-06-22 ENCOUNTER — Inpatient Hospital Stay: Payer: Medicare HMO | Attending: Hematology and Oncology

## 2020-06-22 DIAGNOSIS — E538 Deficiency of other specified B group vitamins: Secondary | ICD-10-CM | POA: Diagnosis present

## 2020-06-22 MED ORDER — CYANOCOBALAMIN 1000 MCG/ML IJ SOLN
1000.0000 ug | Freq: Once | INTRAMUSCULAR | Status: AC
  Administered 2020-06-22: 1000 ug via INTRAMUSCULAR
  Filled 2020-06-22: qty 1

## 2020-06-24 ENCOUNTER — Telehealth: Payer: Self-pay

## 2020-06-24 NOTE — Telephone Encounter (Signed)
Submitted through cover my meds a PA on Xifaxan 543m.

## 2020-06-29 NOTE — Progress Notes (Signed)
[facility]  [address] [place], [place] [address] Phone: [phone]  Fax: [phone]   Clinic Day:  06/30/2020  Referring physician: Lorelee Market, MD  Chief Complaint: Crystal [name] is a 69 y.o. female with decompensated cirrhosis s/p TIPS procedure, right sided pulmonary nodules who is seen for 3 month assessment.  HPI: The patient was last seen in the medical oncology clinic on 04/16/2020. At that time, she had shortness of breath upon exertion. She was considering a blood transfusion.  The patient was admitted to [facility] from 04/22/2020 - 04/24/2020 for altered mental status with infrequent bowel movements. Her ammonia level was elevated at 65. She was treated with Lactulose and had two bowel movements. Home lactulose was increased to TID upon discharge. Metformin was discontinued due to blood sugars in the 50-60s during hospitalization.  ECHO on 05/27/2020 revealed an EF of 60-65%.  Liver ultrasound on 05/28/2020 revealed widely patent TIPS with normal velocities and directional flow. There was increased coarsened echogenicity of the hepatic parenchyma compatible with known history of cirrhosis. There were no discrete hepatic lesions though further evaluation with nonemergent could be performed as indicated. There were similar findings of splenomegaly without ascites.  The patient saw Dr. Marius Ditch on 06/01/2020. She reported a 20 lb weight gain over the previous few weeks. She had significant bilateral leg swelling with no evidence of ascites. She was to continue Lasix 60 mg daily and increase spironolactone to 100 mg daily. Her anemia was worsening. She was referred to Dr. Holley Raring.  Renal ultrasound on 06/15/2020 revealed unremarkable appearance of the kidneys and bladder. There was incidentally noted splenomegaly, present on comparison studies.  CBC and CMP followed: 04/20/2020: Hematocrit 31.4, hemoglobin 9.8, MCV 90.2, platelets 125,000, WBC  3,700.  06/08/2020: Hematocrit 24.3, hemoglobin 8.3, MCV 79.0, platelets 117,000, WBC 3,400. Creatinine 1.75 (CrCl 29 ml/min). Sed rate 52.  Additional labs: 04/20/2020: Retic count 2.5%. Coombs was negative. TSH 2.880. Total T4 7.4. Folate 19.2. 05/24/2020: Ferritin 57. Iron saturation 27%. TIBC 206. 06/08/2020: SPEP showed no M spike. UPEP was normal. ANA was positive.  Peripheral smear review on 04/20/2020 revealed pancytopenia. Platelets were mildly decreased without clumping. RBCs showed mild anisocytosis with normal MCV. WBC was low but neutrophil number appeared normal. There were no abnormal or immature leukocytes were seen.   She received B12 injections on 05/24/2020 and 06/22/2020.  During the interim, she has been fine. She states that "today is a good day" but she does not have any energy. She is eating well. She has a knot on her right calf. She denies chest pain, palpitations, and diarrhea. Her leg swelling has improved since her diuretic medications were increased.  She is not on any blood sugar medications now that she was taken off of Metformin. She checks her blood sugar "every once in a while."   Past Medical History:  Diagnosis Date  . Acute GI bleeding 02/09/2018  . Acute upper gastrointestinal bleeding 08/03/2018  . Anemia    TAKES IRON TAB  . Arthritis    SHOULDER  . B12 deficiency 02/18/2018  . Bleeding    GI  3/19  . Bronchitis    HX OF  . CHF (congestive heart failure) ([place])   . Cirrhosis ([place])   . Colon cancer screening   . Diabetes mellitus without complication ([place])    TYPE 2  . Fatty (change of) liver, not elsewhere classified   . Full dentures    UPPER AND LOWER  .  Iron deficiency anemia due to chronic blood loss 02/18/2018  . Lung nodule, multiple   . Pernicious anemia 02/22/2018  . PUD (peptic ulcer disease)   . Raynaud's syndrome   . Upper GI bleeding 08/03/2018    Past Surgical History:  Procedure Laterality Date  . CATARACT EXTRACTION  W/PHACO Right 02/06/2018   Procedure: CATARACT EXTRACTION PHACO AND INTRAOCULAR LENS PLACEMENT ([place]) RIGHT DIABETIC;  Surgeon: Leandrew Koyanagi, MD;  Location: [place];  Service: Ophthalmology;  Laterality: Right;  DIABETIC, ORAL MED  . CATARACT EXTRACTION W/PHACO Left 04/30/2018   Procedure: CATARACT EXTRACTION PHACO AND INTRAOCULAR LENS PLACEMENT (IOC);  Surgeon: Leandrew Koyanagi, MD;  Location: [facility];  Service: Ophthalmology;  Laterality: Left;  [place] 01:04 AP% 21.3 CDE 13.66 Fluid pack lot # [id number]  . COLONOSCOPY WITH PROPOFOL N/A 03/25/2018   Procedure: COLONOSCOPY WITH PROPOFOL;  Surgeon: Lin Landsman, MD;  Location: [facility] ENDOSCOPY;  Service: Gastroenterology;  Laterality: N/A;  . DENTAL SURGERY     EXTRACTIONS  . ELECTROMAGNETIC NAVIGATION BROCHOSCOPY Right 01/27/2019   Procedure: ELECTROMAGNETIC NAVIGATION BRONCHOSCOPY;  Surgeon: [name], MD;  Location: [facility];  Service: Cardiopulmonary;  Laterality: Right;  . ESOPHAGOGASTRODUODENOSCOPY N/A 08/03/2018   Procedure: ESOPHAGOGASTRODUODENOSCOPY (EGD);  Surgeon: Lin Landsman, MD;  Location: [facility] ENDOSCOPY;  Service: Gastroenterology;  Laterality: N/A;  . ESOPHAGOGASTRODUODENOSCOPY (EGD) WITH PROPOFOL N/A 02/10/2018   Procedure: ESOPHAGOGASTRODUODENOSCOPY (EGD) WITH PROPOFOL;  Surgeon: Jonathon Bellows, MD;  Location: [facility] ENDOSCOPY;  Service: Gastroenterology;  Laterality: N/A;  . ESOPHAGOGASTRODUODENOSCOPY (EGD) WITH PROPOFOL N/A 03/25/2018   Procedure: ESOPHAGOGASTRODUODENOSCOPY (EGD) WITH PROPOFOL;  Surgeon: Lin Landsman, MD;  Location: [facility] ENDOSCOPY;  Service: Gastroenterology;  Laterality: N/A;  . ESOPHAGOGASTRODUODENOSCOPY (EGD) WITH PROPOFOL N/A 04/22/2018   Procedure: ESOPHAGOGASTRODUODENOSCOPY (EGD) WITH PROPOFOL with band ligation;  Surgeon: Lin Landsman, MD;  Location: [place];  Service: Gastroenterology;  Laterality: N/A;  . ESOPHAGOGASTRODUODENOSCOPY (EGD) WITH PROPOFOL N/A  06/24/2018   Procedure: ESOPHAGOGASTRODUODENOSCOPY (EGD) WITH PROPOFOL;  Surgeon: Lin Landsman, MD;  Location: [facility] ENDOSCOPY;  Service: Gastroenterology;  Laterality: N/A;  . ESOPHAGOGASTRODUODENOSCOPY (EGD) WITH PROPOFOL N/A 11/19/2018   Procedure: ESOPHAGOGASTRODUODENOSCOPY (EGD) WITH PROPOFOL;  Surgeon: Lin Landsman, MD;  Location: [facility] ENDOSCOPY;  Service: Gastroenterology;  Laterality: N/A;  . ESOPHAGOGASTRODUODENOSCOPY (EGD) WITH PROPOFOL N/A 03/08/2020   Procedure: ESOPHAGOGASTRODUODENOSCOPY (EGD) WITH PROPOFOL;  Surgeon: Lin Landsman, MD;  Location: [facility] ENDOSCOPY;  Service: Gastroenterology;  Laterality: N/A;  . EYE SURGERY    . GIVENS CAPSULE STUDY N/A 03/29/2020   Procedure: GIVENS CAPSULE STUDY;  Surgeon: Lin Landsman, MD;  Location: [facility] ENDOSCOPY;  Service: Gastroenterology;  Laterality: N/A;  . HEMORRHOID BANDING  03/25/2018   Procedure: HEMORRHOID BANDING;  Surgeon: Lin Landsman, MD;  Location: [facility] ENDOSCOPY;  Service: Gastroenterology;;  . IR EMBO ART  VEN HEMORR LYMPH EXTRAV  INC GUIDE ROADMAPPING  08/07/2018  . IR RADIOLOGIST EVAL & MGMT  09/03/2018  . IR RADIOLOGIST EVAL & MGMT  12/17/2018  . IR RADIOLOGIST EVAL & MGMT  06/24/2019  . IR RADIOLOGIST EVAL & MGMT  01/22/2020  . IR TIPS  08/07/2018  . RADIOLOGY WITH ANESTHESIA N/A 08/07/2018   Procedure: TIPS;  Surgeon: Corrie Mckusick, DO;  Location: [place];  Service: Anesthesiology;  Laterality: N/A;  . TONSILLECTOMY      Family History  Problem Relation Age of Onset  . CAD Father   . Heart attack Father 51  . Cancer Maternal Uncle   . Seizures Mother     Social  History:  reports that she has never smoked. She has never used smokeless tobacco. She reports previous alcohol use. She reports that she does not use drugs. She retired from [employer] in 11/2017. She lives in [place]. The patient is alone today.  Allergies: No Known Allergies  Current Medications: Current Outpatient Medications    Medication Sig Dispense Refill  . albuterol (VENTOLIN HFA) 108 (90 Base) MCG/ACT inhaler Inhale 2 puffs into the lungs every 4 (four) hours as needed.    . Cholecalciferol (VITAMIN D3) 1.25 MG (50000 UT) CAPS Take 1 capsule by mouth once a week.    . cyanocobalamin (,VITAMIN B-12,) 1000 MCG/ML injection Inject 1,000 mcg into the muscle every 30 (thirty) days.     . furosemide (LASIX) 20 MG tablet Take 3 tablets (60 mg total) by mouth daily. 90 tablet 2  . hydrOXYzine (ATARAX/VISTARIL) 25 MG tablet Take 25 mg by mouth 3 (three) times daily.    Marland Kitchen lactulose (CHRONULAC) 10 GM/15ML solution Take by mouth 3 (three) times daily.    Glory Rosebush Delica Lancets 05W MISC 2 (two) times daily.    Glory Rosebush ULTRA test strip 2 (two) times daily.    . pantoprazole (PROTONIX) 40 MG tablet Take 40 mg by mouth 2 (two) times daily.    . potassium chloride (KLOR-CON) 10 MEQ tablet Take 10 mg  (1 tablet daily) daily alternating daysTake 15 mg (1 and 1/2 tablet) daily 37.5 tablet 2  . spironolactone (ALDACTONE) 100 MG tablet TAKE 1 TABLET BY MOUTH EVERY DAY 30 tablet 0  . XIFAXAN 550 MG TABS tablet Take 550 mg by mouth 2 (two) times daily.    . hydrOXYzine (ATARAX/VISTARIL) 10 MG tablet TAKE 1 TABLET (10 MG TOTAL) BY MOUTH 3 (THREE) TIMES DAILY AS NEEDED FOR ITCHING. (Patient not taking: Reported on 06/30/2020) 30 tablet 0  . Misc. Devices KIT Moderate compression hose 20-30 MM HG (Patient not taking: Reported on 06/01/2020) 1 kit 0  . nystatin (MYCOSTATIN/NYSTOP) powder Apply 1 g topically as needed (rash).  (Patient not taking: Reported on 06/30/2020)    . ondansetron (ZOFRAN) 4 MG tablet ondansetron HCl 4 mg tablet  TAKE 1 TABLET BY MOUTH EVERY 6 HOURS AS NEEDED (Patient not taking: Reported on 06/30/2020)    . potassium chloride (KLOR-CON) 10 MEQ tablet Take 10 mEq by mouth daily. (Patient not taking: Reported on 06/30/2020)    . spironolactone (ALDACTONE) 50 MG tablet TAKE 1 TABLET BY MOUTH EVERY DAY (Patient not  taking: Reported on 06/30/2020) 90 tablet 0   No current facility-administered medications for this visit.    Review of Systems  Constitutional: Positive for malaise/fatigue and weight loss (stable). Negative for chills, diaphoresis and fever.  HENT: Negative for congestion, ear discharge, ear pain, hearing loss, nosebleeds, sinus pain, sore throat and tinnitus.   Eyes: Negative for blurred vision and double vision.  Respiratory: Negative for cough, hemoptysis, sputum production and shortness of breath.   Cardiovascular: Positive for leg swelling (improving). Negative for chest pain and palpitations.  Gastrointestinal: Negative for abdominal pain, blood in stool, constipation, diarrhea, heartburn, melena, nausea and vomiting.       Eating well.  Genitourinary: Negative for dysuria, frequency, hematuria and urgency.  Musculoskeletal: Negative for back pain, joint pain, myalgias and neck pain.       Knot on back of right leg.  Skin: Negative for itching and rash.       Raynaud's.  Neurological: Negative for dizziness, tingling, sensory change, weakness  and headaches.  Endo/Heme/Allergies: Does not bruise/bleed easily.       Diabetes, no meds at this time.  Psychiatric/Behavioral: Negative for depression and memory loss. The patient is not nervous/anxious and does not have insomnia.   All other systems reviewed and are negative.  Performance status (ECOG): 1-2  Vitals Blood pressure (!) 130/57, pulse 70, temperature 98.5 F (36.9 C), temperature source Tympanic, resp. rate 18, weight 189 lb 9.5 oz (86 kg).   Physical Exam Vitals and nursing note reviewed.  Constitutional:      General: She is not in acute distress.    Appearance: Normal appearance. She is well-developed. She is not diaphoretic.     Interventions: Face mask in place.  HENT:     Head: Normocephalic and atraumatic.     Comments: Short white hair.    Mouth/Throat:     Mouth: Mucous membranes are moist. No oral  lesions.     Pharynx: Oropharynx is clear.  Eyes:     General: No scleral icterus.    Extraocular Movements: Extraocular movements intact.     Conjunctiva/sclera: Conjunctivae normal.     Pupils: Pupils are equal, round, and reactive to light.     Comments: Glasses. Blue eyes.   Neck:     Vascular: No JVD.  Cardiovascular:     Rate and Rhythm: Normal rate and regular rhythm.     Heart sounds: Normal heart sounds. No murmur heard.  No friction rub. No gallop.   Pulmonary:     Effort: Pulmonary effort is normal.     Breath sounds: Normal breath sounds. No wheezing, rhonchi or rales.  Abdominal:     General: Bowel sounds are normal. There is no distension.     Palpations: Abdomen is soft. There is no mass.     Tenderness: There is no abdominal tenderness. There is no guarding or rebound.  Musculoskeletal:        General: No tenderness. Normal range of motion.     Cervical back: Normal range of motion and neck supple.     Right lower leg: Edema present.     Left lower leg: Edema present.  Lymphadenopathy:     Head:     Right side of head: No preauricular, posterior auricular or occipital adenopathy.     Left side of head: No preauricular, posterior auricular or occipital adenopathy.     Cervical: No cervical adenopathy.     Upper Body:     Right upper body: No supraclavicular or axillary adenopathy.     Left upper body: No supraclavicular or axillary adenopathy.     Lower Body: No right inguinal adenopathy. No left inguinal adenopathy.  Skin:    General: Skin is warm and dry.     Coloration: Skin is not pale.     Findings: No erythema.     Comments: Telangiectasias on face and chest. Spider angiomas. Fingertips appear blue. Right lateral distal extremity has prominent areas that appear to be crater scars. No knots. No redness. No increased warmth.  Neurological:     Mental Status: She is alert and oriented to person, place, and time.  Psychiatric:        Behavior: Behavior  normal.        Thought Content: Thought content normal.        Judgment: Judgment normal.    No visits with results within 3 Day(s) from this visit.  Latest known visit with results is:  Admission on 05/27/2020, Discharged on  05/27/2020  Component Date Value Ref Range Status  . Sodium 05/27/2020 137  135 - 145 mmol/L Final  . Potassium 05/27/2020 3.4* 3.5 - 5.1 mmol/L Final  . Chloride 05/27/2020 109  98 - 111 mmol/L Final  . CO2 05/27/2020 22  22 - 32 mmol/L Final  . Glucose, Bld 05/27/2020 105* 70 - 99 mg/dL Final   Glucose reference range applies only to samples taken after fasting for at least 8 hours.  . BUN 05/27/2020 15  8 - 23 mg/dL Final  . Creatinine, Ser 05/27/2020 1.61* 0.44 - 1.00 mg/dL Final  . Calcium 05/27/2020 8.3* 8.9 - 10.3 mg/dL Final  . GFR calc non Af Amer 05/27/2020 33* >60 mL/min Final  . GFR calc Af Amer 05/27/2020 38* >60 mL/min Final  . Anion gap 05/27/2020 6  5 - 15 Final   Performed at [facility], [address] [place], [place] [address]  . WBC 05/27/2020 4.1  4.0 - 10.5 K/uL Final  . RBC 05/27/2020 3.09* 3.87 - 5.11 MIL/uL Final  . Hemoglobin 05/27/2020 8.6* 12.0 - 15.0 g/dL Final  . HCT 05/27/2020 26.7* 36 - 46 % Final  . MCV 05/27/2020 86.4  80.0 - 100.0 fL Final  . MCH 05/27/2020 27.8  26.0 - 34.0 pg Final  . MCHC 05/27/2020 32.2  30.0 - 36.0 g/dL Final  . RDW 05/27/2020 15.4  11.5 - 15.5 % Final  . Platelets 05/27/2020 125* 150 - 400 K/uL Final  . nRBC 05/27/2020 0.0  0.0 - 0.2 % Final   Performed at [facility], [address] [place], [place] [address]  . Troponin I (High Sensitivity) 05/27/2020 10  <18 ng/L Final   Comment: (NOTE) Elevated high sensitivity troponin I (hsTnI) values and significant  changes across serial measurements may suggest ACS but many other  chronic and acute conditions are known to elevate hsTnI results.  Refer to the "Links" section for chest pain algorithms and additional   guidance. Performed at [facility], [address], [place], [place] [address]   . B Natriuretic Peptide 05/27/2020 464.9* 0.0 - 100.0 pg/mL Final   Performed at [facility], [address] [place], [place] [address]  . TSH 05/27/2020 3.742  0.350 - 4.500 uIU/mL Final   Comment: Performed by a 3rd Generation assay with a functional sensitivity of <=0.01 uIU/mL. Performed at [facility], [address], [place], [place] [address]     Assessment:  Crystal [name] is a 69 y.o. female with a right lower lobe pulmonary nodule. She denies any smoking history.  Abdomen and pelvic CTon 02/09/2018 revealed an 1.8 cm spiculated density in right lower lobe concerning for malignancy. There was mildly nodular hepatic contourconcerning for hepatic cirrhosis. There was moderate splenomegalysuggesting portal venous hypertension. Mild ascites was noted. There were mildly enlarged retroperitoneal lymph nodes (largest 1.1 cm) concerning for possible metastatic disease or malignancy. There was a 7 cm left ovarian cyst. Further evaluation with MRI was recommended to evaluate for possible neoplasm. CA125was 351.3 and CEA1.1 on 02/21/2018.   Chest CTon 02/25/2018 revealed a 2.3 x 1.5 x 1.0 cm perifissural nodulewith spiculated margins in the right middle lobe. There was a 1.5 x 1.5 x 1.4 cm right lower lobe spiculated nodule. There was a moderate left sided pleural effusion.   PET scanon 03/08/2018 revealed a 2.5 x 1.8 cm sub solid nodular opacity in theRML(SUV 2.7) and a 1.6 cm roundedslightly spiculated RLL pulmonary nodule(SUV 2.1). There were  no enlarged or hypermetabolic mediastinal or hilar lymph nodes. There was a small left pleural effusion. 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  Chest CTon 05/15/2018 revealed no significant changes in the 2 previously noted  nodules in the RIGHT lung. Irregular RML lesion measures 2.4 x 1.4 cm (previously 2.3 x 1.5 cm). Spiculated central RLL lesion measures 1.8 x 1.4 cm (previously 1.6 x 1.5 cm). Both areas remain concerning for neoplasm. There was interval decrease in ascites volume overall. Upper abdominal lymphadenopathy noted that was felt to be reactive due to underlying liver disease.   Chest CTon 08/15/2018 revealed the spiculated right lower lobe lung lesion appeared slightly increased and was not significantly changed in size compared with previous exam. Morphologically, this was worrisome for a small bronchogenic neoplasm. The right middle lobe lung nodule was slightly decreased in size in the interval and may be post infectious or inflammatory in etiology.  Super D chest CTon 01/24/2019 revealed a 3.2 x 2.3 cm ill-defined anterior right lung nodule compared to 2.9 x 2.3 cm previously. Minor fissurewasnot well, and as such, this nodule can not be confidently localized to the right upper or middle lobe. The right lower lobe nodule measured2.0 x 2.0 cm comparedto 2.0 x 1.9 cm.  Chest CTon 08/07/2019 revealed no significant interval change in size right middle lobe and right lower lobe lung nodules.Liverwascompatible with cirrhosiss/pTIPS.Chest CTon 02/02/2020 revealed stable nodules within the right middle and right lower lobes.Therewerecirrhotic changesof the liver with TIPS shunt in place. There was mild prominence of the ascending aorta to 4 cm. Recommendedannual imaging followup by CTA or MRA.  She was admitted to [facility] from 02/09/2018 - 04/02/2019with a variceal hemorrhage. EGDon 02/10/2018 revealed 5 columns of grade III esophageal varices in the lower third of the esophagus. There was stigmata of recent bleeding and red wale signs. She underwent variceal ligation x 10. She required 3 units of PRBCs.  EGDon 03/25/2018 revealed 2 angiectasias(non-bleeding) in the second  portion of the duodenum, and a few diminutive (non-bleeding) angiectasias in the prepyloric region of the stomach that were treated APC. There were 4 non-bleeding ulcers (Forrest Class III) found in the gastric fundus. There were 4 columns of large non-bleeding varicesin the lower third of the esophagus, of which demonstrated no stigmata of bleeding. Varices were banded. EGD on 04/22/2018 revealed one non-bleeding cratered gastric ulcer(Forrest Class III) in the lesser curvature of the gastric body. Duodenal bulb and second portion of the duodenum were normal. Moderate portal hypertensive gastropathy noted in the stomach. Large varices in the lower third of the esophagus noted. 2 bands were placed with incomplete eradication of the lesions. EGD on 03/08/2020 revealed a few non-bleeding angioectasias in the duodenum and a gastric antral vascular ectasia with bleeding treated with argon plasma coagulation (APC).  There was a single non-bleeding angioectasia in the stomach treated with bipolar cautery. Gastroesophageal junction and esophagus were normal. No specimens were collected.  Colonoscopyon 03/25/2018 revealed a single 5 mm sessile polyp in the ascending colon. Patient has rectal varices and external hemorrhoids. Pathology returned as tubular adenoma, and was negative for high grade dysplasia and malignancy.   She has iron deficiency anemia. Ferritinwas 6. She is on ferrous sulfate 325 mg TID. She hasB12 deficiency. B12was 103. Intrinsic factor antibody was negative. 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 She began B12 injections on 03/06/2018 (last05/18/2021). Folate was12.3on12/14/2020. B12 was 485 on 05/24/2018. Dietis good.  She  received Venoferweekly x 4 (07/12/2018 - 07/31/2018), x 3 (12/22/2019 -01/05/2020), and x 3 (03/30/2020 - 04/13/2020).  Ferritinhas been followed: 6 on 02/09/2018, 16 on 05/24/2018, 13 on 07/10/2018, 374  on 08/15/2018, 485 on 08/21/2018, 114 on 09/25/2018, 63 on 11/20/2018, 35 on 02/12/2019, 22 on 04/24/2019, 41 on 09/22/2019, 41 on 10/27/2019, 21 on 12/15/2019, 22 on 03/22/2020, 20 on 03/26/2020, 278 on 04/14/2020, 57 on 05/24/2020, and 23 on 06/30/2020.  She has hepatitis C antibodies. HCV RNA was not detected. She has been exposed to hepatitis B. Anti-smooth muscle antibodiesare weakly positive. ANAwas positive (>1:1280 centromere antibody). Negative studiesincluded: ceruloplasmin, anti-mitochondrial antibodies, hepatitis B IgM, HIV, and alpha-1 antitrypsin. PT was 15.2 (INR 1.21). AFPwas 1.5 on 05/28/2018,2.4 on 11/20/2018, and 2.0 on 04/24/2019.  She has a history of decompensated cirrhosiss/p TIPSprocedure 08/07/2018. She has portal hypertension and a history of recurrent bleeding esophageal varices.She was given a 2-year life expectancyfollowing her TIPS procedure. Abdominal/pelvic arterial/venous ultrasound doppleron 04/24/2019 revealed a patent TIPS and no evidence of persisting ascites.  Abdominal MRI including MRCPon 11/12/2019 revealed cirrhosis with TIPS in place.There was no biliary duct dilatation.There was cholelithiasis without acute cholecystitis. Right hepatic lobe 9 mm lesion whichwas indeterminate, LR 3.This was possibly present on the prior CT of 2019. Recommendation includedrepeat pre and post contrast abdominal MRI at 3-6 months. There as small volume abdominal ascites.The right lower lobe pulmonary nodulewas suboptimally evaluated.  [facility].HeadCT revealedno acute intracranial pathology. Head MRIwo contrast showed symmetric FLAIR hyperintensity within the bilateral mamillary bodies. This finding may be seen in the setting of Wernicke encephalopathy.There was no evidence of acute infarct and mild chronic small vessels with ischemic disease.Her mental status returned to  baseline.  RUQ ultrasoundon 05/29/2018 revealed cholelithiasis. There was a thickened edematous gallbladder wall. The wall thickening could be due to hypoproteinemia/hypoalbuminemia or cholecystitis. Negative sonographic Murphy's sign. There was a heterogeneous slightly nodular liver consistent with cirrhosis.  Pelvic ultrasoundon 03/18/2018 demonstrated a simple cyst in the LEFT ovarymeasuring 6.1 x 4.5 x 6.6 cm. Repeat imaging recommended in 1 year to document stability.  The patient received the [place] COVID-19 vaccine on 12/26/2019 and 01/21/2020.  Symptomatically, she notes "today is a good day", but she does not have any energy. She is eating well.  Lower extremity swelling has improved since her diuretics were increased.  She denies any bleeding.  Plan: 1.   [facility] labs (CBC with differential, ferritin, iron studies, retic, sed rate) today. 2.Decompensated cirrhosis s/p TIPS Clinically,she continues to do fairly well. Life expectancy is limited s/pTIPS on 08/07/2018 (2 years). She denies any bleeding. AFPwas 2.0on06/09/2019. Patient is followed by GI. 3.Pulmonary nodules Chest CTon 02/02/2020 revealed stable nodules within the right middle and right lower lobes.  Consider possible chest CT in 1 year.  Recommendation was for annual follow-up CTA or MRA. 4.Iron deficiency anemia Hematocrit24.5. Hemoglobin8.1. MCV79on08/18//2021.  Ferritin23 with an iron saturation of 19% and a TIBC of 252.  Sed rate is 50.  Retic is 1.8% (low). RBCs are microcytic (previously normocytic) and thus consistent with iron deficiency.   Sed rate likely falsely elevating ferritin. Discuss plan for IV iron if ferritin < 30 (labs available after appointment). Review additional work-up from labs in 05/2020. 5.B12 deficiency Patient receives B12 monthly (last07/10/2020) B12 today and monthly x6. 6.Left ovarian  cyst Pelvic ultrasoundon05/04/2018 revealed a simple cyst in the left ovary (6.1 x 4.5 x 6.6 cm). Patient declined follow-up. 7.   [facility] labs today. 8.  RN to call patient with results and if transfusion or Venofer needed. 9.   RTC in 1 month for MD assessment and [facility] labs prior to visit.  Addendum 1:  The patient was contacted regarding initiation of IV iron. Addendum 2: Renal ultrasound on 06/15/2020 revealed marked splenomegaly (13.9 x 15.2 x 5.6 cm; 618 ml) similar to comparison study of 05/28/2020 and MR of the abdomen on 11/12/2019.  I discussed the assessment and treatment plan with the patient.  The patient was provided an opportunity to ask questions and all were answered.  The patient agreed with the plan and demonstrated an understanding of the instructions.  The patient was advised to call back if the symptoms worsen or if the condition fails to improve as anticipated.  I provided 17 minutes of face-to-face time during this this encounter and > 50% was spent counseling as documented under my assessment and plan.  An additional 10 minutes were spent reviewing his chart (Epic and Care Everywhere) including notes, labs, and imaging studies.    Lequita Asal, MD, PhD    06/30/2020, 2:43 PM  I, Mirian Mo Tufford, am acting as Education administrator for [employer]. Mike Gip, MD, PhD.  I, Onyinyechi Huante C. Mike Gip, MD, have reviewed the above documentation for accuracy and completeness, and I agree with the above.

## 2020-06-29 NOTE — H&P (View-Only) (Signed)
[facility]  [address] [place], [place] [address] Phone: [phone]  Fax: [phone]   Clinic Day:  06/30/2020  Referring physician: Lorelee Market, MD  Chief Complaint: Crystal [name] is a 69 y.o. female with decompensated cirrhosis s/p TIPS procedure, right sided pulmonary nodules who is seen for 3 month assessment.  HPI: The patient was last seen in the medical oncology clinic on 04/16/2020. At that time, she had shortness of breath upon exertion. She was considering a blood transfusion.  The patient was admitted to [facility] from 04/22/2020 - 04/24/2020 for altered mental status with infrequent bowel movements. Her ammonia level was elevated at 65. She was treated with Lactulose and had two bowel movements. Home lactulose was increased to TID upon discharge. Metformin was discontinued due to blood sugars in the 50-60s during hospitalization.  ECHO on 05/27/2020 revealed an EF of 60-65%.  Liver ultrasound on 05/28/2020 revealed widely patent TIPS with normal velocities and directional flow. There was increased coarsened echogenicity of the hepatic parenchyma compatible with known history of cirrhosis. There were no discrete hepatic lesions though further evaluation with nonemergent could be performed as indicated. There were similar findings of splenomegaly without ascites.  The patient saw Dr. Marius Ditch on 06/01/2020. She reported a 20 lb weight gain over the previous few weeks. She had significant bilateral leg swelling with no evidence of ascites. She was to continue Lasix 60 mg daily and increase spironolactone to 100 mg daily. Her anemia was worsening. She was referred to Dr. Holley Raring.  Renal ultrasound on 06/15/2020 revealed unremarkable appearance of the kidneys and bladder. There was incidentally noted splenomegaly, present on comparison studies.  CBC and CMP followed: 04/20/2020: Hematocrit 31.4, hemoglobin 9.8, MCV 90.2, platelets 125,000, WBC  3,700.  06/08/2020: Hematocrit 24.3, hemoglobin 8.3, MCV 79.0, platelets 117,000, WBC 3,400. Creatinine 1.75 (CrCl 29 ml/min). Sed rate 52.  Additional labs: 04/20/2020: Retic count 2.5%. Coombs was negative. TSH 2.880. Total T4 7.4. Folate 19.2. 05/24/2020: Ferritin 57. Iron saturation 27%. TIBC 206. 06/08/2020: SPEP showed no M spike. UPEP was normal. ANA was positive.  Peripheral smear review on 04/20/2020 revealed pancytopenia. Platelets were mildly decreased without clumping. RBCs showed mild anisocytosis with normal MCV. WBC was low but neutrophil number appeared normal. There were no abnormal or immature leukocytes were seen.   She received B12 injections on 05/24/2020 and 06/22/2020.  During the interim, she has been fine. She states that "today is a good day" but she does not have any energy. She is eating well. She has a knot on her right calf. She denies chest pain, palpitations, and diarrhea. Her leg swelling has improved since her diuretic medications were increased.  She is not on any blood sugar medications now that she was taken off of Metformin. She checks her blood sugar "every once in a while."   Past Medical History:  Diagnosis Date  . Acute GI bleeding 02/09/2018  . Acute upper gastrointestinal bleeding 08/03/2018  . Anemia    TAKES IRON TAB  . Arthritis    SHOULDER  . B12 deficiency 02/18/2018  . Bleeding    GI  3/19  . Bronchitis    HX OF  . CHF (congestive heart failure) ([place])   . Cirrhosis ([place])   . Colon cancer screening   . Diabetes mellitus without complication ([place])    TYPE 2  . Fatty (change of) liver, not elsewhere classified   . Full dentures    UPPER AND LOWER  .  Iron deficiency anemia due to chronic blood loss 02/18/2018  . Lung nodule, multiple   . Pernicious anemia 02/22/2018  . PUD (peptic ulcer disease)   . Raynaud's syndrome   . Upper GI bleeding 08/03/2018    Past Surgical History:  Procedure Laterality Date  . CATARACT EXTRACTION  W/PHACO Right 02/06/2018   Procedure: CATARACT EXTRACTION PHACO AND INTRAOCULAR LENS PLACEMENT ([place]) RIGHT DIABETIC;  Surgeon: Leandrew Koyanagi, MD;  Location: [place];  Service: Ophthalmology;  Laterality: Right;  DIABETIC, ORAL MED  . CATARACT EXTRACTION W/PHACO Left 04/30/2018   Procedure: CATARACT EXTRACTION PHACO AND INTRAOCULAR LENS PLACEMENT (I[facility]);  Surgeon: Leandrew Koyanagi, MD;  Location: [facility] ORS;  Service: Ophthalmology;  Laterality: Left;  [place] 01:04 AP% 21.3 CDE 13.66 Fluid pack lot # [id number]  . COLONOSCOPY WITH PROPOFOL N/A 03/25/2018   Procedure: COLONOSCOPY WITH PROPOFOL;  Surgeon: Lin Landsman, MD;  Location: [facility] ENDOSCOPY;  Service: Gastroenterology;  Laterality: N/A;  . DENTAL SURGERY     EXTRACTIONS  . ELECTROMAGNETIC NAVIGATION BROCHOSCOPY Right 01/27/2019   Procedure: ELECTROMAGNETIC NAVIGATION BRONCHOSCOPY;  Surgeon: [name], MD;  Location: [facility];  Service: Cardiopulmonary;  Laterality: Right;  . ESOPHAGOGASTRODUODENOSCOPY N/A 08/03/2018   Procedure: ESOPHAGOGASTRODUODENOSCOPY (EGD);  Surgeon: Lin Landsman, MD;  Location: [facility] ENDOSCOPY;  Service: Gastroenterology;  Laterality: N/A;  . ESOPHAGOGASTRODUODENOSCOPY (EGD) WITH PROPOFOL N/A 02/10/2018   Procedure: ESOPHAGOGASTRODUODENOSCOPY (EGD) WITH PROPOFOL;  Surgeon: Jonathon Bellows, MD;  Location: [facility] ENDOSCOPY;  Service: Gastroenterology;  Laterality: N/A;  . ESOPHAGOGASTRODUODENOSCOPY (EGD) WITH PROPOFOL N/A 03/25/2018   Procedure: ESOPHAGOGASTRODUODENOSCOPY (EGD) WITH PROPOFOL;  Surgeon: Lin Landsman, MD;  Location: [facility] ENDOSCOPY;  Service: Gastroenterology;  Laterality: N/A;  . ESOPHAGOGASTRODUODENOSCOPY (EGD) WITH PROPOFOL N/A 04/22/2018   Procedure: ESOPHAGOGASTRODUODENOSCOPY (EGD) WITH PROPOFOL with band ligation;  Surgeon: Lin Landsman, MD;  Location: [place];  Service: Gastroenterology;  Laterality: N/A;  . ESOPHAGOGASTRODUODENOSCOPY (EGD) WITH PROPOFOL N/A  06/24/2018   Procedure: ESOPHAGOGASTRODUODENOSCOPY (EGD) WITH PROPOFOL;  Surgeon: Lin Landsman, MD;  Location: [facility] ENDOSCOPY;  Service: Gastroenterology;  Laterality: N/A;  . ESOPHAGOGASTRODUODENOSCOPY (EGD) WITH PROPOFOL N/A 11/19/2018   Procedure: ESOPHAGOGASTRODUODENOSCOPY (EGD) WITH PROPOFOL;  Surgeon: Lin Landsman, MD;  Location: [facility] ENDOSCOPY;  Service: Gastroenterology;  Laterality: N/A;  . ESOPHAGOGASTRODUODENOSCOPY (EGD) WITH PROPOFOL N/A 03/08/2020   Procedure: ESOPHAGOGASTRODUODENOSCOPY (EGD) WITH PROPOFOL;  Surgeon: Lin Landsman, MD;  Location: [facility] ENDOSCOPY;  Service: Gastroenterology;  Laterality: N/A;  . EYE SURGERY    . GIVENS CAPSULE STUDY N/A 03/29/2020   Procedure: GIVENS CAPSULE STUDY;  Surgeon: Lin Landsman, MD;  Location: [facility] ENDOSCOPY;  Service: Gastroenterology;  Laterality: N/A;  . HEMORRHOID BANDING  03/25/2018   Procedure: HEMORRHOID BANDING;  Surgeon: Lin Landsman, MD;  Location: [facility] ENDOSCOPY;  Service: Gastroenterology;;  . IR EMBO ART  VEN HEMORR LYMPH EXTRAV  INC GUIDE ROADMAPPING  08/07/2018  . IR RADIOLOGIST EVAL & MGMT  09/03/2018  . IR RADIOLOGIST EVAL & MGMT  12/17/2018  . IR RADIOLOGIST EVAL & MGMT  06/24/2019  . IR RADIOLOGIST EVAL & MGMT  01/22/2020  . IR TIPS  08/07/2018  . RADIOLOGY WITH ANESTHESIA N/A 08/07/2018   Procedure: TIPS;  Surgeon: Corrie Mckusick, DO;  Location: [place];  Service: Anesthesiology;  Laterality: N/A;  . TONSILLECTOMY      Family History  Problem Relation Age of Onset  . CAD Father   . Heart attack Father 110  . Cancer Maternal Uncle   . Seizures Mother     Social  History:  reports that she has never smoked. She has never used smokeless tobacco. She reports previous alcohol use. She reports that she does not use drugs. She retired from [employer] in 11/2017. She lives in [place]. The patient is alone today.  Allergies: No Known Allergies  Current Medications: Current Outpatient Medications    Medication Sig Dispense Refill  . albuterol (VENTOLIN HFA) 108 (90 Base) MCG/ACT inhaler Inhale 2 puffs into the lungs every 4 (four) hours as needed.    . Cholecalciferol (VITAMIN D3) 1.25 MG (50000 UT) CAPS Take 1 capsule by mouth once a week.    . cyanocobalamin (,VITAMIN B-12,) 1000 MCG/ML injection Inject 1,000 mcg into the muscle every 30 (thirty) days.     . furosemide (LASIX) 20 MG tablet Take 3 tablets (60 mg total) by mouth daily. 90 tablet 2  . hydrOXYzine (ATARAX/VISTARIL) 25 MG tablet Take 25 mg by mouth 3 (three) times daily.    Marland Kitchen lactulose (CHRONULAC) 10 GM/15ML solution Take by mouth 3 (three) times daily.    Glory Rosebush Delica Lancets 76A MISC 2 (two) times daily.    Glory Rosebush ULTRA test strip 2 (two) times daily.    . pantoprazole (PROTONIX) 40 MG tablet Take 40 mg by mouth 2 (two) times daily.    . potassium chloride (KLOR-CON) 10 MEQ tablet Take 10 mg  (1 tablet daily) daily alternating daysTake 15 mg (1 and 1/2 tablet) daily 37.5 tablet 2  . spironolactone (ALDACTONE) 100 MG tablet TAKE 1 TABLET BY MOUTH EVERY DAY 30 tablet 0  . XIFAXAN 550 MG TABS tablet Take 550 mg by mouth 2 (two) times daily.    . hydrOXYzine (ATARAX/VISTARIL) 10 MG tablet TAKE 1 TABLET (10 MG TOTAL) BY MOUTH 3 (THREE) TIMES DAILY AS NEEDED FOR ITCHING. (Patient not taking: Reported on 06/30/2020) 30 tablet 0  . Misc. Devices KIT Moderate compression hose 20-30 MM HG (Patient not taking: Reported on 06/01/2020) 1 kit 0  . nystatin (MYCOSTATIN/NYSTOP) powder Apply 1 g topically as needed (rash).  (Patient not taking: Reported on 06/30/2020)    . ondansetron (ZOFRAN) 4 MG tablet ondansetron HCl 4 mg tablet  TAKE 1 TABLET BY MOUTH EVERY 6 HOURS AS NEEDED (Patient not taking: Reported on 06/30/2020)    . potassium chloride (KLOR-CON) 10 MEQ tablet Take 10 mEq by mouth daily. (Patient not taking: Reported on 06/30/2020)    . spironolactone (ALDACTONE) 50 MG tablet TAKE 1 TABLET BY MOUTH EVERY DAY (Patient not  taking: Reported on 06/30/2020) 90 tablet 0   No current facility-administered medications for this visit.    Review of Systems  Constitutional: Positive for malaise/fatigue and weight loss (stable). Negative for chills, diaphoresis and fever.  HENT: Negative for congestion, ear discharge, ear pain, hearing loss, nosebleeds, sinus pain, sore throat and tinnitus.   Eyes: Negative for blurred vision and double vision.  Respiratory: Negative for cough, hemoptysis, sputum production and shortness of breath.   Cardiovascular: Positive for leg swelling (improving). Negative for chest pain and palpitations.  Gastrointestinal: Negative for abdominal pain, blood in stool, constipation, diarrhea, heartburn, melena, nausea and vomiting.       Eating well.  Genitourinary: Negative for dysuria, frequency, hematuria and urgency.  Musculoskeletal: Negative for back pain, joint pain, myalgias and neck pain.       Knot on back of right leg.  Skin: Negative for itching and rash.       Raynaud's.  Neurological: Negative for dizziness, tingling, sensory change, weakness  and headaches.  Endo/Heme/Allergies: Does not bruise/bleed easily.       Diabetes, no meds at this time.  Psychiatric/Behavioral: Negative for depression and memory loss. The patient is not nervous/anxious and does not have insomnia.   All other systems reviewed and are negative.  Performance status (ECOG): 1-2  Vitals Blood pressure (!) 130/57, pulse 70, temperature 98.5 F (36.9 C), temperature source Tympanic, resp. rate 18, weight 189 lb 9.5 oz (86 kg).   Physical Exam Vitals and nursing note reviewed.  Constitutional:      General: She is not in acute distress.    Appearance: Normal appearance. She is well-developed. She is not diaphoretic.     Interventions: Face mask in place.  HENT:     Head: Normocephalic and atraumatic.     Comments: Short white hair.    Mouth/Throat:     Mouth: Mucous membranes are moist. No oral  lesions.     Pharynx: Oropharynx is clear.  Eyes:     General: No scleral icterus.    Extraocular Movements: Extraocular movements intact.     Conjunctiva/sclera: Conjunctivae normal.     Pupils: Pupils are equal, round, and reactive to light.     Comments: Glasses. Blue eyes.   Neck:     Vascular: No JVD.  Cardiovascular:     Rate and Rhythm: Normal rate and regular rhythm.     Heart sounds: Normal heart sounds. No murmur heard.  No friction rub. No gallop.   Pulmonary:     Effort: Pulmonary effort is normal.     Breath sounds: Normal breath sounds. No wheezing, rhonchi or rales.  Abdominal:     General: Bowel sounds are normal. There is no distension.     Palpations: Abdomen is soft. There is no mass.     Tenderness: There is no abdominal tenderness. There is no guarding or rebound.  Musculoskeletal:        General: No tenderness. Normal range of motion.     Cervical back: Normal range of motion and neck supple.     Right lower leg: Edema present.     Left lower leg: Edema present.  Lymphadenopathy:     Head:     Right side of head: No preauricular, posterior auricular or occipital adenopathy.     Left side of head: No preauricular, posterior auricular or occipital adenopathy.     Cervical: No cervical adenopathy.     Upper Body:     Right upper body: No supraclavicular or axillary adenopathy.     Left upper body: No supraclavicular or axillary adenopathy.     Lower Body: No right inguinal adenopathy. No left inguinal adenopathy.  Skin:    General: Skin is warm and dry.     Coloration: Skin is not pale.     Findings: No erythema.     Comments: Telangiectasias on face and chest. Spider angiomas. Fingertips appear blue. Right lateral distal extremity has prominent areas that appear to be crater scars. No knots. No redness. No increased warmth.  Neurological:     Mental Status: She is alert and oriented to person, place, and time.  Psychiatric:        Behavior: Behavior  normal.        Thought Content: Thought content normal.        Judgment: Judgment normal.    No visits with results within 3 Day(s) from this visit.  Latest known visit with results is:  Admission on 05/27/2020, Discharged on  05/27/2020  Component Date Value Ref Range Status  . Sodium 05/27/2020 137  135 - 145 mmol/L Final  . Potassium 05/27/2020 3.4* 3.5 - 5.1 mmol/L Final  . Chloride 05/27/2020 109  98 - 111 mmol/L Final  . CO2 05/27/2020 22  22 - 32 mmol/L Final  . Glucose, Bld 05/27/2020 105* 70 - 99 mg/dL Final   Glucose reference range applies only to samples taken after fasting for at least 8 hours.  . BUN 05/27/2020 15  8 - 23 mg/dL Final  . Creatinine, Ser 05/27/2020 1.61* 0.44 - 1.00 mg/dL Final  . Calcium 05/27/2020 8.3* 8.9 - 10.3 mg/dL Final  . GFR calc non Af Amer 05/27/2020 33* >60 mL/min Final  . GFR calc Af Amer 05/27/2020 38* >60 mL/min Final  . Anion gap 05/27/2020 6  5 - 15 Final   Performed at [facility], [address], [place], [place] [address]  . WBC 05/27/2020 4.1  4.0 - 10.5 K/uL Final  . RBC 05/27/2020 3.09* 3.87 - 5.11 MIL/uL Final  . Hemoglobin 05/27/2020 8.6* 12.0 - 15.0 g/dL Final  . HCT 05/27/2020 26.7* 36 - 46 % Final  . MCV 05/27/2020 86.4  80.0 - 100.0 fL Final  . MCH 05/27/2020 27.8  26.0 - 34.0 pg Final  . MCHC 05/27/2020 32.2  30.0 - 36.0 g/dL Final  . RDW 05/27/2020 15.4  11.5 - 15.5 % Final  . Platelets 05/27/2020 125* 150 - 400 K/uL Final  . nRBC 05/27/2020 0.0  0.0 - 0.2 % Final   Performed at [facility], [address], [place], [place] [address]  . Troponin I (High Sensitivity) 05/27/2020 10  <18 ng/L Final   Comment: (NOTE) Elevated high sensitivity troponin I (hsTnI) values and significant  changes across serial measurements may suggest ACS but many other  chronic and acute conditions are known to elevate hsTnI results.  Refer to the "Links" section for chest pain algorithms and additional   guidance. Performed at [facility], [address] [place], [place] [address]   . B Natriuretic Peptide 05/27/2020 464.9* 0.0 - 100.0 pg/mL Final   Performed at [facility], [address] [place], [place] [address]  . TSH 05/27/2020 3.742  0.350 - 4.500 uIU/mL Final   Comment: Performed by a 3rd Generation assay with a functional sensitivity of <=0.01 uIU/mL. Performed at [facility], [address], [place], [place] [address]     Assessment:  Crystal [name] is a 69 y.o. female with a right lower lobe pulmonary nodule. She denies any smoking history.  Abdomen and pelvic CTon 02/09/2018 revealed an 1.8 cm spiculated density in right lower lobe concerning for malignancy. There was mildly nodular hepatic contourconcerning for hepatic cirrhosis. There was moderate splenomegalysuggesting portal venous hypertension. Mild ascites was noted. There were mildly enlarged retroperitoneal lymph nodes (largest 1.1 cm) concerning for possible metastatic disease or malignancy. There was a 7 cm left ovarian cyst. Further evaluation with MRI was recommended to evaluate for possible neoplasm. CA125was 351.3 and CEA1.1 on 02/21/2018.   Chest CTon 02/25/2018 revealed a 2.3 x 1.5 x 1.0 cm perifissural nodulewith spiculated margins in the right middle lobe. There was a 1.5 x 1.5 x 1.4 cm right lower lobe spiculated nodule. There was a moderate left sided pleural effusion.   PET scanon 03/08/2018 revealed a 2.5 x 1.8 cm sub solid nodular opacity in theRML(SUV 2.7) and a 1.6 cm roundedslightly spiculated RLL pulmonary nodule(SUV 2.1). There were  no enlarged or hypermetabolic mediastinal or hilar lymph nodes. There was a small left pleural effusion. 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  Chest CTon 05/15/2018 revealed no significant changes in the 2 previously noted  nodules in the RIGHT lung. Irregular RML lesion measures 2.4 x 1.4 cm (previously 2.3 x 1.5 cm). Spiculated central RLL lesion measures 1.8 x 1.4 cm (previously 1.6 x 1.5 cm). Both areas remain concerning for neoplasm. There was interval decrease in ascites volume overall. Upper abdominal lymphadenopathy noted that was felt to be reactive due to underlying liver disease.   Chest CTon 08/15/2018 revealed the spiculated right lower lobe lung lesion appeared slightly increased and was not significantly changed in size compared with previous exam. Morphologically, this was worrisome for a small bronchogenic neoplasm. The right middle lobe lung nodule was slightly decreased in size in the interval and may be post infectious or inflammatory in etiology.  Super D chest CTon 01/24/2019 revealed a 3.2 x 2.3 cm ill-defined anterior right lung nodule compared to 2.9 x 2.3 cm previously. Minor fissurewasnot well, and as such, this nodule can not be confidently localized to the right upper or middle lobe. The right lower lobe nodule measured2.0 x 2.0 cm comparedto 2.0 x 1.9 cm.  Chest CTon 08/07/2019 revealed no significant interval change in size right middle lobe and right lower lobe lung nodules.Liverwascompatible with cirrhosiss/pTIPS.Chest CTon 02/02/2020 revealed stable nodules within the right middle and right lower lobes.Therewerecirrhotic changesof the liver with TIPS shunt in place. There was mild prominence of the ascending aorta to 4 cm. Recommendedannual imaging followup by CTA or MRA.  She was admitted to [facility] from 02/09/2018 - 04/02/2019with a variceal hemorrhage. EGDon 02/10/2018 revealed 5 columns of grade III esophageal varices in the lower third of the esophagus. There was stigmata of recent bleeding and red wale signs. She underwent variceal ligation x 10. She required 3 units of PRBCs.  EGDon 03/25/2018 revealed 2 angiectasias(non-bleeding) in the second  portion of the duodenum, and a few diminutive (non-bleeding) angiectasias in the prepyloric region of the stomach that were treated APC. There were 4 non-bleeding ulcers (Forrest Class III) found in the gastric fundus. There were 4 columns of large non-bleeding varicesin the lower third of the esophagus, of which demonstrated no stigmata of bleeding. Varices were banded. EGD on 04/22/2018 revealed one non-bleeding cratered gastric ulcer(Forrest Class III) in the lesser curvature of the gastric body. Duodenal bulb and second portion of the duodenum were normal. Moderate portal hypertensive gastropathy noted in the stomach. Large varices in the lower third of the esophagus noted. 2 bands were placed with incomplete eradication of the lesions. EGD on 03/08/2020 revealed a few non-bleeding angioectasias in the duodenum and a gastric antral vascular ectasia with bleeding treated with argon plasma coagulation (APC).  There was a single non-bleeding angioectasia in the stomach treated with bipolar cautery. Gastroesophageal junction and esophagus were normal. No specimens were collected.  Colonoscopyon 03/25/2018 revealed a single 5 mm sessile polyp in the ascending colon. Patient has rectal varices and external hemorrhoids. Pathology returned as tubular adenoma, and was negative for high grade dysplasia and malignancy.   She has iron deficiency anemia. Ferritinwas 6. She is on ferrous sulfate 325 mg TID. She hasB12 deficiency. B12was 103. Intrinsic factor antibody was negative. 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 She began B12 injections on 03/06/2018 (last05/18/2021). Folate was12.3on12/14/2020. B12 was 485 on 05/24/2018. Dietis good.  She  received Venoferweekly x 4 (07/12/2018 - 07/31/2018), x 3 (12/22/2019 -01/05/2020), and x 3 (03/30/2020 - 04/13/2020).  Ferritinhas been followed: 6 on 02/09/2018, 16 on 05/24/2018, 13 on 07/10/2018, 374  on 08/15/2018, 485 on 08/21/2018, 114 on 09/25/2018, 63 on 11/20/2018, 35 on 02/12/2019, 22 on 04/24/2019, 41 on 09/22/2019, 41 on 10/27/2019, 21 on 12/15/2019, 22 on 03/22/2020, 20 on 03/26/2020, 278 on 04/14/2020, 57 on 05/24/2020, and 23 on 06/30/2020.  She has hepatitis C antibodies. HCV RNA was not detected. She has been exposed to hepatitis B. Anti-smooth muscle antibodiesare weakly positive. ANAwas positive (>1:1280 centromere antibody). Negative studiesincluded: ceruloplasmin, anti-mitochondrial antibodies, hepatitis B IgM, HIV, and alpha-1 antitrypsin. PT was 15.2 (INR 1.21). AFPwas 1.5 on 05/28/2018,2.4 on 11/20/2018, and 2.0 on 04/24/2019.  She has a history of decompensated cirrhosiss/p TIPSprocedure 08/07/2018. She has portal hypertension and a history of recurrent bleeding esophageal varices.She was given a 2-year life expectancyfollowing her TIPS procedure. Abdominal/pelvic arterial/venous ultrasound doppleron 04/24/2019 revealed a patent TIPS and no evidence of persisting ascites.  Abdominal MRI including MRCPon 11/12/2019 revealed cirrhosis with TIPS in place.There was no biliary duct dilatation.There was cholelithiasis without acute cholecystitis. Right hepatic lobe 9 mm lesion whichwas indeterminate, LR 3.This was possibly present on the prior CT of 2019. Recommendation includedrepeat pre and post contrast abdominal MRI at 3-6 months. There as small volume abdominal ascites.The right lower lobe pulmonary nodulewas suboptimally evaluated.  [facility] with[facility].HeadCT revealedno acute intracranial pathology. Head MRIwo contrast showed symmetric FLAIR hyperintensity within the bilateral mamillary bodies. This finding may be seen in the setting of Wernicke encephalopathy.There was no evidence of acute infarct and mild chronic small vessels with ischemic disease.Her mental status returned to  baseline.  RUQ ultrasoundon 05/29/2018 revealed cholelithiasis. There was a thickened edematous gallbladder wall. The wall thickening could be due to hypoproteinemia/hypoalbuminemia or cholecystitis. Negative sonographic Murphy's sign. There was a heterogeneous slightly nodular liver consistent with cirrhosis.  Pelvic ultrasoundon 03/18/2018 demonstrated a simple cyst in the LEFT ovarymeasuring 6.1 x 4.5 x 6.6 cm. Repeat imaging recommended in 1 year to document stability.  The patient received the [place] COVID-19 vaccine on 12/26/2019 and 01/21/2020.  Symptomatically, she notes "today is a good day", but she does not have any energy. She is eating well.  Lower extremity swelling has improved since her diuretics were increased.  She denies any bleeding.  Plan: 1.   [facility] labs (CBC with differential, ferritin, iron studies, retic, sed rate) today. 2.Decompensated cirrhosis s/p TIPS Clinically,she continues to do fairly well. Life expectancy is limited s/pTIPS on 08/07/2018 (2 years). She denies any bleeding. AFPwas 2.0on06/09/2019. Patient is followed by GI. 3.Pulmonary nodules Chest CTon 02/02/2020 revealed stable nodules within the right middle and right lower lobes.  Consider possible chest CT in 1 year.  Recommendation was for annual follow-up CTA or MRA. 4.Iron deficiency anemia Hematocrit24.5. Hemoglobin8.1. MCV79on08/18//2021.  Ferritin23 with an iron saturation of 19% and a TIBC of 252.  Sed rate is 50.  Retic is 1.8% (low). RBCs are microcytic (previously normocytic) and thus consistent with iron deficiency.   Sed rate likely falsely elevating ferritin. Discuss plan for IV iron if ferritin < 30 (labs available after appointment). Review additional work-up from labs in 05/2020. 5.B12 deficiency Patient receives B12 monthly (last07/10/2020) B12 today and monthly x6. 6.Left ovarian  cyst Pelvic ultrasoundon05/04/2018 revealed a simple cyst in the left ovary (6.1 x 4.5 x 6.6 cm). Patient declined follow-up. 7.   [facility] labs today. 8.  RN to call patient with results and if transfusion or Venofer needed. 9.   RTC in 1 month for MD assessment and [facility] labs prior to visit.  Addendum 1:  The patient was contacted regarding initiation of IV iron. Addendum 2: Renal ultrasound on 06/15/2020 revealed marked splenomegaly (13.9 x 15.2 x 5.6 cm; 618 ml) similar to comparison study of 05/28/2020 and MR of the abdomen on 11/12/2019.  I discussed the assessment and treatment plan with the patient.  The patient was provided an opportunity to ask questions and all were answered.  The patient agreed with the plan and demonstrated an understanding of the instructions.  The patient was advised to call back if the symptoms worsen or if the condition fails to improve as anticipated.  I provided 17 minutes of face-to-face time during this this encounter and > 50% was spent counseling as documented under my assessment and plan.  An additional 10 minutes were spent reviewing his chart (Epic and Care Everywhere) including notes, labs, and imaging studies.    Lequita Asal, MD, PhD    06/30/2020, 2:43 PM  I, Mirian Mo Tufford, am acting as Education administrator for [employer]. Mike Gip, MD, PhD.  I, Quinnie Barcelo C. Mike Gip, MD, have reviewed the above documentation for accuracy and completeness, and I agree with the above.

## 2020-06-30 ENCOUNTER — Ambulatory Visit: Payer: Medicare HMO

## 2020-06-30 ENCOUNTER — Inpatient Hospital Stay (HOSPITAL_BASED_OUTPATIENT_CLINIC_OR_DEPARTMENT_OTHER): Payer: Medicare HMO | Admitting: Hematology and Oncology

## 2020-06-30 ENCOUNTER — Other Ambulatory Visit: Payer: Self-pay

## 2020-06-30 ENCOUNTER — Ambulatory Visit: Payer: Medicare HMO | Admitting: Hematology and Oncology

## 2020-06-30 ENCOUNTER — Inpatient Hospital Stay: Payer: Medicare HMO

## 2020-06-30 VITALS — BP 130/57 | HR 70 | Temp 98.5°F | Resp 18 | Wt 189.6 lb

## 2020-06-30 DIAGNOSIS — D500 Iron deficiency anemia secondary to blood loss (chronic): Secondary | ICD-10-CM

## 2020-06-30 DIAGNOSIS — R918 Other nonspecific abnormal finding of lung field: Secondary | ICD-10-CM | POA: Diagnosis not present

## 2020-06-30 DIAGNOSIS — E538 Deficiency of other specified B group vitamins: Secondary | ICD-10-CM | POA: Diagnosis not present

## 2020-06-30 NOTE — Progress Notes (Signed)
Patient reports SOB with exertion, diarrhea with lactulose TID, nausea once this month, and a bump on the back of her leg she wants dr Mike Gip to look at.

## 2020-07-01 ENCOUNTER — Encounter: Payer: Self-pay | Admitting: Hematology and Oncology

## 2020-07-02 ENCOUNTER — Other Ambulatory Visit: Payer: Self-pay

## 2020-07-02 ENCOUNTER — Other Ambulatory Visit: Payer: Self-pay | Admitting: Gastroenterology

## 2020-07-02 ENCOUNTER — Ambulatory Visit (INDEPENDENT_AMBULATORY_CARE_PROVIDER_SITE_OTHER): Payer: Medicare HMO | Admitting: Family

## 2020-07-02 ENCOUNTER — Encounter: Payer: Self-pay | Admitting: Family

## 2020-07-02 VITALS — BP 138/58 | HR 65 | Ht 64.0 in | Wt 185.0 lb

## 2020-07-02 DIAGNOSIS — I8510 Secondary esophageal varices without bleeding: Secondary | ICD-10-CM

## 2020-07-02 DIAGNOSIS — I5032 Chronic diastolic (congestive) heart failure: Secondary | ICD-10-CM | POA: Diagnosis not present

## 2020-07-02 DIAGNOSIS — D500 Iron deficiency anemia secondary to blood loss (chronic): Secondary | ICD-10-CM

## 2020-07-02 DIAGNOSIS — I8511 Secondary esophageal varices with bleeding: Secondary | ICD-10-CM

## 2020-07-02 DIAGNOSIS — I2584 Coronary atherosclerosis due to calcified coronary lesion: Secondary | ICD-10-CM | POA: Diagnosis not present

## 2020-07-02 DIAGNOSIS — R600 Localized edema: Secondary | ICD-10-CM

## 2020-07-02 DIAGNOSIS — K7581 Nonalcoholic steatohepatitis (NASH): Secondary | ICD-10-CM

## 2020-07-02 DIAGNOSIS — R188 Other ascites: Secondary | ICD-10-CM

## 2020-07-02 DIAGNOSIS — K7460 Unspecified cirrhosis of liver: Secondary | ICD-10-CM | POA: Diagnosis not present

## 2020-07-02 DIAGNOSIS — I2510 Atherosclerotic heart disease of native coronary artery without angina pectoris: Secondary | ICD-10-CM

## 2020-07-02 MED ORDER — POTASSIUM CHLORIDE CRYS ER 10 MEQ PO TBCR: EXTENDED_RELEASE_TABLET | ORAL | 2 refills | Status: DC

## 2020-07-02 NOTE — Progress Notes (Signed)
Office Visit    Patient Name: Crystal [name] Date of Encounter: 07/03/2020  Primary Care Provider:  Lorelee Market, MD Primary Cardiologist:  Nelva Bush, MD Electrophysiologist:  None   Chief Complaint    Crystal [name] is a 69 y.o. female with a hx of chronic diastolic heart failure, cirrhosis complicated by esophageal varices requiring endoscopic treatment and TI PS, PUD, DM2, lung nodules with nondiagnostic biopsy 01/2019, B12 deficiency, iron deficiency anemia presents today for follow-up of diastolic heart failure  Past Medical History    Past Medical History:  Diagnosis Date  . Acute GI bleeding 02/09/2018  . Acute upper gastrointestinal bleeding 08/03/2018  . Anemia    TAKES IRON TAB  . Arthritis    SHOULDER  . B12 deficiency 02/18/2018  . Bleeding    GI  3/19  . Bronchitis    HX OF  . CHF (congestive heart failure) ([place])   . Cirrhosis ([place])   . Colon cancer screening   . Diabetes mellitus without complication ([place])    TYPE 2  . Fatty (change of) liver, not elsewhere classified   . Full dentures    UPPER AND LOWER  . Iron deficiency anemia due to chronic blood loss 02/18/2018  . Lung nodule, multiple   . Pernicious anemia 02/22/2018  . PUD (peptic ulcer disease)   . Raynaud's syndrome   . Upper GI bleeding 08/03/2018   Past Surgical History:  Procedure Laterality Date  . CATARACT EXTRACTION W/PHACO Right 02/06/2018   Procedure: CATARACT EXTRACTION PHACO AND INTRAOCULAR LENS PLACEMENT ([place]) RIGHT DIABETIC;  Surgeon: Leandrew Koyanagi, MD;  Location: [facility];  Service: Ophthalmology;  Laterality: Right;  DIABETIC, ORAL MED  . CATARACT EXTRACTION W/PHACO Left 04/30/2018   Procedure: CATARACT EXTRACTION PHACO AND INTRAOCULAR LENS PLACEMENT ([facility]);  Surgeon: Leandrew Koyanagi, MD;  Location: [facility];  Service: Ophthalmology;  Laterality: Left;  [place] 01:04 AP% 21.3 CDE 13.66 Fluid pack lot # [id number]  . COLONOSCOPY WITH PROPOFOL N/A 03/25/2018    Procedure: COLONOSCOPY WITH PROPOFOL;  Surgeon: Lin Landsman, MD;  Location: [facility] ENDOSCOPY;  Service: Gastroenterology;  Laterality: N/A;  . DENTAL SURGERY     EXTRACTIONS  . ELECTROMAGNETIC NAVIGATION BROCHOSCOPY Right 01/27/2019   Procedure: ELECTROMAGNETIC NAVIGATION BRONCHOSCOPY;  Surgeon: [name], MD;  Location: [facility];  Service: Cardiopulmonary;  Laterality: Right;  . ESOPHAGOGASTRODUODENOSCOPY N/A 08/03/2018   Procedure: ESOPHAGOGASTRODUODENOSCOPY (EGD);  Surgeon: Lin Landsman, MD;  Location: [facility] ENDOSCOPY;  Service: Gastroenterology;  Laterality: N/A;  . ESOPHAGOGASTRODUODENOSCOPY (EGD) WITH PROPOFOL N/A 02/10/2018   Procedure: ESOPHAGOGASTRODUODENOSCOPY (EGD) WITH PROPOFOL;  Surgeon: Jonathon Bellows, MD;  Location: [facility] ENDOSCOPY;  Service: Gastroenterology;  Laterality: N/A;  . ESOPHAGOGASTRODUODENOSCOPY (EGD) WITH PROPOFOL N/A 03/25/2018   Procedure: ESOPHAGOGASTRODUODENOSCOPY (EGD) WITH PROPOFOL;  Surgeon: Lin Landsman, MD;  Location: [facility] ENDOSCOPY;  Service: Gastroenterology;  Laterality: N/A;  . ESOPHAGOGASTRODUODENOSCOPY (EGD) WITH PROPOFOL N/A 04/22/2018   Procedure: ESOPHAGOGASTRODUODENOSCOPY (EGD) WITH PROPOFOL with band ligation;  Surgeon: Lin Landsman, MD;  Location: [facility];  Service: Gastroenterology;  Laterality: N/A;  . ESOPHAGOGASTRODUODENOSCOPY (EGD) WITH PROPOFOL N/A 06/24/2018   Procedure: ESOPHAGOGASTRODUODENOSCOPY (EGD) WITH PROPOFOL;  Surgeon: Lin Landsman, MD;  Location: [facility] ENDOSCOPY;  Service: Gastroenterology;  Laterality: N/A;  . ESOPHAGOGASTRODUODENOSCOPY (EGD) WITH PROPOFOL N/A 11/19/2018   Procedure: ESOPHAGOGASTRODUODENOSCOPY (EGD) WITH PROPOFOL;  Surgeon: Lin Landsman, MD;  Location: [facility] ENDOSCOPY;  Service: Gastroenterology;  Laterality: N/A;  . ESOPHAGOGASTRODUODENOSCOPY (EGD) WITH PROPOFOL N/A 03/08/2020   Procedure: ESOPHAGOGASTRODUODENOSCOPY (EGD) WITH  PROPOFOL;  Surgeon: Lin Landsman, MD;   Location: [facility] ENDOSCOPY;  Service: Gastroenterology;  Laterality: N/A;  . EYE SURGERY    . GIVENS CAPSULE STUDY N/A 03/29/2020   Procedure: GIVENS CAPSULE STUDY;  Surgeon: Lin Landsman, MD;  Location: [facility] ENDOSCOPY;  Service: Gastroenterology;  Laterality: N/A;  . HEMORRHOID BANDING  03/25/2018   Procedure: HEMORRHOID BANDING;  Surgeon: Lin Landsman, MD;  Location: [facility] ENDOSCOPY;  Service: Gastroenterology;;  . IR EMBO ART  VEN HEMORR LYMPH EXTRAV  INC GUIDE ROADMAPPING  08/07/2018  . IR RADIOLOGIST EVAL & MGMT  09/03/2018  . IR RADIOLOGIST EVAL & MGMT  12/17/2018  . IR RADIOLOGIST EVAL & MGMT  06/24/2019  . IR RADIOLOGIST EVAL & MGMT  01/22/2020  . IR TIPS  08/07/2018  . RADIOLOGY WITH ANESTHESIA N/A 08/07/2018   Procedure: TIPS;  Surgeon: Corrie Mckusick, DO;  Location: [facility];  Service: Anesthesiology;  Laterality: N/A;  . TONSILLECTOMY      Allergies  No Known Allergies  History of Present Illness    Crystal [name] is a 69 y.o. female with a hx of chronic diastolic heart failure, cirrhosis complicated by esophageal varices requiring endoscopic treatment and TI PS, PUD, DM2, lung nodules with nondiagnostic biopsy 01/2019, B12 deficiency, iron deficiency anemia last seen 05/31/20.  She has had coronary artery calcification and aortic atherosclerosis incidentally noted on prior CTs.  No symptoms suggestive of CAD.  Echo 04/04/2019 LVEF 55 to 60%, mild LVH, LV diastolic parameters consistent with impaired relaxation, RV normal size and function, no significant valvular abnormalities, mild dilation of aortic root 3.5 cm.  Clinic visit 12/2019 increased Lasix to 59m until weight returned to baseline as she was 6 pounds up. Underwent EGD 03/08/2020 with GI and capsule study 03/29/2020.  Follows with oncology for B12 deficiency/iron deficiency anemia. LE duplex 03/30/20 ordered by oncology due to LE edema with no evidence of acute DVT bilaterally. Noted chronic DVT, calcified, at the junction  of distal common femoral vein and origin of the femoral vein with diminished flow focally at this site. As it was chronic, not recommended for anticoagulation especially in setting of anemia.   Clinic visit 04/14/20 she was 9 lbs over her dry weight. Recommended to increase to Lasix 671mfor one week.   Seen 04/21/20 down 7 lbs. She was recommended to take Lasix 60 mg and 40 mg on alternating days.  Admitted 04/22/20-04/24/20 with hepatic encephalopathy, initial ammonia level 65 Her lactulose was increased to TID. 05/25/20 labs with GI showed worsening renal function and her diuretics were stopped. Echo 05/27/20 with normal LVEF and grade 2 diastolic dysfunction, RV mildly enlarged, no significant valvular abnormalities. She called the office on 05/27/20 noting weight gain was recommended to proceed to the ED for evaluation - BNP of 464.9, creatinine 1.61, GFR 33, hemoglobin 8.6.  CXR with slight increase in size of irregular right middle lung nodule/mass compared to prior CT with recommendation for follow-up CT chest.  Ultrasound of the liver with widely patent TIPS with normal velocities and directional flow.  She was sent home with recommendation for Lasix 60 mg daily.  Seen in clinic 05/31/20 up 26 pounds compared to 6 weeks prior. She was planning to establish with nephrology. She was recommended for Lasix 6061maily, Spironolactone 11m65mily.   Established with Dr. LateHolley Raringnephrology. Recommended to continue present diuretic dose. Additional workup included SPEP, UPEP, ANA, renal ultrasound. Renal U/X 06/15/20 was unremarkable with incidental splenomegaly present on  comparison studies.  Weight down 30 lbs since clinic visit 1 month ago. Reports some improvement in her DOE and LE edema. Tell me when she "does a lot" she some dyspnea, but not with her normal activities. Denies chest pain, pressure, tightness.   Tells me today she has felt more confused just today. She does her Lactulose three times per day.  Thinks she might have missed a dose yesterday. She has been instructed by Dr. Marius Ditch in the past when this happens to take her Lactulose until she has a BM and is planning to do so as soon as she gets home. She was able to have a complete conversation with me without difficulty nor confusion.    EKGs/Labs/Other Studies Reviewed:   The following studies were reviewed today:  LE duplex 03/30/20 IMPRESSION: 1. No evidence of deep venous thrombosis in either lower extremity in the right lower extremity.   2. Apparent chronic deep venous thrombosis, calcified, at the junction of the distal common femoral vein and origin of the femoral vein with diminished flow focally at this site. No acute appearing deep venous thrombosis seen on the left. No other site of chronic  Echo 03/2019 1. The left ventricle has normal systolic function, with an ejection  fraction of 55-60%. The cavity size was normal. There is mildly increased  left ventricular wall thickness. Left ventricular diastolic Doppler  parameters are consistent with impaired  relaxation.   2. The right ventricle has normal systolic function. The cavity was  normal. There is no increase in right ventricular wall thickness. Unable  to estimate RVSP   3. Left atrial size was mildly dilated.   4. There is dilatation of the aortic root 3.5 cm   EKG:  EKG is ordered today.  The ekg ordered today demonstrates SR 65 bpm with low voltage QRS and no acute ST/T wave changes.  Recent Labs: 08/21/2019: Magnesium 1.8 04/22/2020: ALT 16 05/27/2020: B Natriuretic Peptide 464.9; Hemoglobin 8.6; Platelets 125; TSH 3.742 06/08/2020: BUN 18; Creatinine, Ser 1.75; Potassium 3.9; Sodium 139  Recent Lipid Panel    Component Value Date/Time   TRIG 163 (H) 08/03/2018 2152    Home Medications   Current Meds  Medication Sig  . albuterol (VENTOLIN HFA) 108 (90 Base) MCG/ACT inhaler Inhale 2 puffs into the lungs every 4 (four) hours as needed.  .  Cholecalciferol (VITAMIN D3) 1.25 MG (50000 UT) CAPS Take 1 capsule by mouth once a week.  . cyanocobalamin (,VITAMIN B-12,) 1000 MCG/ML injection Inject 1,000 mcg into the muscle every 30 (thirty) days.   . furosemide (LASIX) 20 MG tablet Take 3 tablets (60 mg total) by mouth daily.  . hydrOXYzine (ATARAX/VISTARIL) 10 MG tablet TAKE 1 TABLET (10 MG TOTAL) BY MOUTH 3 (THREE) TIMES DAILY AS NEEDED FOR ITCHING.  . hydrOXYzine (ATARAX/VISTARIL) 25 MG tablet Take 25 mg by mouth 3 (three) times daily.  Marland Kitchen lactulose (CHRONULAC) 10 GM/15ML solution Take by mouth 3 (three) times daily.  . Misc. Devices KIT Moderate compression hose 20-30 MM HG  . nystatin (MYCOSTATIN/NYSTOP) powder Apply 1 g topically as needed (rash).   . ondansetron (ZOFRAN) 4 MG tablet ondansetron HCl 4 mg tablet  TAKE 1 TABLET BY MOUTH EVERY 6 HOURS AS NEEDED  . OneTouch Delica Lancets 32D MISC 2 (two) times daily.  Glory Rosebush ULTRA test strip 2 (two) times daily.  . pantoprazole (PROTONIX) 40 MG tablet Take 40 mg by mouth 2 (two) times daily.  . potassium chloride (KLOR-CON) 10  MEQ tablet Take 10 mg  (1 tablet daily) daily alternating daysTake 5 mg (1/2 tablet) daily  . spironolactone (ALDACTONE) 100 MG tablet TAKE 1 TABLET BY MOUTH EVERY DAY  . XIFAXAN 550 MG TABS tablet Take 550 mg by mouth 2 (two) times daily.  . [DISCONTINUED] potassium chloride (KLOR-CON) 10 MEQ tablet Take 10 mg  (1 tablet daily) daily alternating daysTake 15 mg (1 and 1/2 tablet) daily  . [DISCONTINUED] spironolactone (ALDACTONE) 50 MG tablet TAKE 1 TABLET BY MOUTH EVERY DAY    Review of Systems   Review of Systems  Constitutional: Negative for chills, fever and malaise/fatigue.  Cardiovascular: Positive for dyspnea on exertion. Negative for chest pain, leg swelling, near-syncope, orthopnea, palpitations and syncope.  Respiratory: Negative for cough, shortness of breath and wheezing.   Gastrointestinal: Negative for bloating, nausea and vomiting.    Neurological: Negative for dizziness, light-headedness and weakness.   All other systems reviewed and are otherwise negative except as noted above.  Physical Exam    VS:  BP (!) 138/58 (BP Location: Left Arm, Patient Position: Sitting, Cuff Size: Normal)   Pulse 65   Ht 5' 4" (1.626 m)   Wt 185 lb (83.9 kg)   LMP  (LMP Unknown)   BMI 31.76 kg/m  , BMI Body mass index is 31.76 kg/m. GEN: Well nourished, well developed, in no acute distress. HEENT: normal. Neck: Supple, no JVD, carotid bruits, or masses. Cardiac: RRR, no murmurs, rubs, or gallops. No clubbing, cyanosis. No edema.  Radials/PT 2+ and equal bilaterally.  Respiratory:  Respirations regular and unlabored, clear to auscultation bilaterally. GI: Soft, nontender, nondistended MS: No deformity or atrophy. Skin: Warm and dry, no rash. Neuro:  Strength and sensation are intact. Psych: Normal affect.  Assessment & Plan    1. Chronic diastolic heart failure - Echo 05/27/20 normal LVEF 60-65% and gr2DD with mildly dilated RV. Weight up down 30 lbs from last clinic visit. Improvement in DOE and LE edema. Dr. Marius Ditch of GI has increased her Spironolactone to 173m daily. She continues on Lasix 623mdaily  She will call for weight gain of 2lb overnight or 5lb in 1 week.   2. Iron deficiency anemia/B12 deficiency - Follows with Dr. CoMike Gip   3. Chronic DVT - 03/30/20 bilateral LE duplex with on acute DVT and noted chronic DVT to right deep common femoral vein. No indication for anticoagulation as DVT is well calcified. No indication for referral to vascular surgery. LE edema primarily diastolic heart failure, cirrhosis, venous insufficiency in etiology.   4. Coronary artery calcification on CT and aortic atherosclerosis - No anginal symptoms. No indication for ischemic evaluation at this time. 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   5. Cirrhosis -follows with GI. Thinks she might have missed dose of lactulose yesterday as she notes mild  confusion and she will take as soon as she gets home. As she was able to have full conversation with me without confusion or difficulty, will defer checking ammonia level. Her family is aware of when to seek medical attention for any worsening confusion.  6. CKDIIIb - Careful titration of diuretics. Continue to follow with nephrology.   Disposition: Follow up in 3 months with Dr. EnSaunders Revelr APP.    CaLoel DubonnetNP 07/03/2020, 8:43 AM

## 2020-07-02 NOTE — Patient Instructions (Signed)
Medication Instructions:  No medication changes today.  *If you need a refill on your cardiac medications before your next appointment, please call your pharmacy*  Lab Work: No lab work today.  Testing/Procedures: Your EKG today shows normal sinus rhythm.  Follow-Up: At [facility], you and your health needs are our priority.  As part of our continuing mission to provide you with exceptional heart care, we have created designated Provider Care Teams.  These Care Teams include your primary Cardiologist (physician) and Advanced Practice Providers (APPs -  Physician Assistants and Nurse Practitioners) who all work together to provide you with the care you need, when you need it.  We recommend signing up for the patient portal called "MyChart".  Sign up information is provided on this After Visit Summary.  MyChart is used to connect with patients for Virtual Visits (Telemedicine).  Patients are able to view lab/test results, encounter notes, upcoming appointments, etc.  Non-urgent messages can be sent to your provider as well.   To learn more about what you can do with MyChart, go to [email or web address].    Your next appointment:   3 month(s)  The format for your next appointment:   In Person  Provider:   You may see Nelva Bush, MD or one of the following Advanced Practice Providers on your designated Care Team:    Murray Hodgkins, NP  Christell Faith, PA-C  Marrianne Mood, PA-C  [name], NP

## 2020-07-06 NOTE — Telephone Encounter (Signed)
Last office visit 06/01/2020 decompensated hepatic cirrhosis  Last refill  008/08/2020 0 refills

## 2020-07-08 ENCOUNTER — Inpatient Hospital Stay: Payer: Medicare HMO

## 2020-07-08 ENCOUNTER — Other Ambulatory Visit: Payer: Self-pay

## 2020-07-08 ENCOUNTER — Encounter: Payer: Self-pay | Admitting: Hematology and Oncology

## 2020-07-08 VITALS — BP 117/62 | HR 53 | Temp 97.8°F | Resp 16

## 2020-07-08 DIAGNOSIS — D500 Iron deficiency anemia secondary to blood loss (chronic): Secondary | ICD-10-CM

## 2020-07-08 DIAGNOSIS — E538 Deficiency of other specified B group vitamins: Secondary | ICD-10-CM

## 2020-07-08 MED ORDER — SODIUM CHLORIDE 0.9 % IV SOLN
Freq: Once | INTRAVENOUS | Status: AC
  Filled 2020-07-08: qty 250

## 2020-07-08 MED ORDER — IRON SUCROSE 20 MG/ML IV SOLN
200.0000 mg | Freq: Once | INTRAVENOUS | Status: AC
  Administered 2020-07-08: 200 mg via INTRAVENOUS
  Filled 2020-07-08: qty 10

## 2020-07-13 ENCOUNTER — Ambulatory Visit (INDEPENDENT_AMBULATORY_CARE_PROVIDER_SITE_OTHER): Payer: Medicare HMO | Admitting: Gastroenterology

## 2020-07-13 ENCOUNTER — Other Ambulatory Visit: Payer: Self-pay

## 2020-07-13 ENCOUNTER — Encounter: Payer: Self-pay | Admitting: Gastroenterology

## 2020-07-13 VITALS — BP 133/68 | HR 76 | Temp 97.9°F | Wt 189.0 lb

## 2020-07-13 DIAGNOSIS — K7460 Unspecified cirrhosis of liver: Secondary | ICD-10-CM

## 2020-07-13 DIAGNOSIS — M25474 Effusion, right foot: Secondary | ICD-10-CM

## 2020-07-13 DIAGNOSIS — K7290 Hepatic failure, unspecified without coma: Secondary | ICD-10-CM | POA: Diagnosis not present

## 2020-07-13 DIAGNOSIS — M25472 Effusion, left ankle: Secondary | ICD-10-CM | POA: Diagnosis not present

## 2020-07-13 DIAGNOSIS — M25475 Effusion, left foot: Secondary | ICD-10-CM | POA: Diagnosis not present

## 2020-07-13 DIAGNOSIS — M25471 Effusion, right ankle: Secondary | ICD-10-CM

## 2020-07-13 DIAGNOSIS — D500 Iron deficiency anemia secondary to blood loss (chronic): Secondary | ICD-10-CM

## 2020-07-13 DIAGNOSIS — D509 Iron deficiency anemia, unspecified: Secondary | ICD-10-CM

## 2020-07-13 MED ORDER — LACTULOSE 10 GM/15ML PO SOLN: 20.0000 g | Freq: Two times a day (BID) | ORAL | 3 refills | Status: AC

## 2020-07-13 NOTE — Patient Instructions (Signed)
1.  Increase Lasix to 80 mg for 1 week then decrease to 60 mg daily 2.  Low will undergo push enteroscopy to look into your small intestine for worsening anemia 3.  You are sent in prescription for lactulose 4.  Follow-up in my office in 4 to 6 weeks  Please call our office to speak with my nurse Ulyess Blossom [phone] during business hours from 8am to 4pm if you have any questions/concerns. During after hours, you will be redirected to on call GI physician. For any emergency please call 911 or go the nearest emergency room.    [name], MD [address]  [place]  [place], [place] [address]  Main: [phone]  Fax: [phone]

## 2020-07-13 NOTE — Progress Notes (Signed)
[name], MD [address]  [place]  [place], [place] [address]  Main: [phone]  Fax: [phone]    Gastroenterology Consultation  Referring Provider:     Lorelee Market, MD Primary Care Physician:  Lorelee Market, MD Primary Gastroenterologist:  Dr. [name] Reason for Consultation:     Decompensated cirrhosis        HPI:   Betsie A Hemenway is a 69 y.o. female referred by Dr. Lorelee Market, MD  for consultation & management of decompensated cirrhosis.   Follow-up visit 06/26/2019 Patient was told to make a follow-up to see me to discuss about abnormal labs by her PCP.  I reviewed the labs from 06/12/2019 which revealed normal hemoglobin, platelets 110, normal BUN/creatinine, total bilirubin 2, alkaline phosphatase 170.  She had repeat labs at Dr. Pasty Arch office in [place] on 8/3, alkaline phosphatase 173, AST 60, total bilirubin 1.5, creatinine 1.18.  Patient reports she has been doing very well.  She denies any swelling of legs, dark urine, pruritus, abdominal pain, melena.  She feels she has remained the same from liver standpoint.  She is requesting lactulose for 3 months supply  She is single, lives with a roommate  Follow-up visit 05/03/2020 Patient was recently admitted to [facility] on 6/12 secondary to increasing fatigue.  Her hemoglobin A1c was low and she had hypoglycemic episode.  Therefore, antidiabetic medications were discontinued.  Her hemoglobin was 8.1 during that admission.  Patient is here for follow-up of decompensated cirrhosis.  She reports increasing fatigue.  She says she has not been able to perform her daily activities like she used to do before.  She reports mild swelling of her feet.  She denies any weight gain.She denies fever, chills, abdominal pain, nausea or vomiting, hematemesis, rectal bleeding, hematochezia.  She denies black stools or melena.  She underwent upper endoscopy which revealed gastric AVMs that were cauterized.   Subsequently, underwent video capsule endoscopy which was unremarkable.  She does have progressively worsening anemia without active GI bleed. She is compliant with her diuretics, lactulose, rifaximin.  She is also noted to have declining renal function. She does report nocturnal pruritus for which she takes hydroxyzine 10 mg every 5 hours at night  Follow-up visit 06/01/2020 Patient made an urgent visit secondary to volume overload within last 4 weeks.  She gained more than 20 pounds in last few weeks.  She noticed significant swelling of legs extending to upper thighs as well as exertional dyspnea.  She reports that she has been taking diuretics however her renal function was gradually getting worse.  Therefore I held diuretics.  She also acknowledges that she eats out almost daily.  She does eat hamburger with pickle on a regular basis as well.  She went to ER last week.  Labs revealed potassium 3.4, slightly improved creatinine 1.6 from 1.7, normal BUN, hemoglobin 8.6 which is at baseline, elevated BNP 464.9, TSH normal She denies abdominal pain, fever, chills, nausea or vomiting.  Diuretics were restarted, Lasix increased to 60 mg daily along with spironolactone 50 mg daily  Follow-up visit 07/13/2020 Patient continues to lose weight on current dose of diuretics.  She is maintaining low-sodium diet.  She feels loopy she ran out of lactulose.  She is currently on rifaximin twice daily.  Her hemoglobin is also downtrending with worsening iron deficiency.  Latest hemoglobin is 8.1, received IV iron last Wednesday and scheduled for second dose next week.  She denies black stools.  Does  report her stools alternating between yellow and dark.  NSAIDs: none including BC powder or Goody powder  Antiplts/Anticoagulants/Anti thrombotics: none  GI Procedures:  Video capsule endoscopy 03/29/2020 Capsule study results:  Non specific no bleeding tiny erythematous spots in small bowel. If patient develops worse  anemia recommend a push enteroscopy   EGD 03/08/2020 - A few non-bleeding angioectasias in the duodenum. Treated with argon plasma coagulation (APC). - Gastric antral vascular ectasia with bleeding. Treated with argon plasma coagulation (APC). - A single non-bleeding angioectasia in the stomach. Treated with bipolar cautery. - Normal gastroesophageal junction and esophagus. - No specimens collected.  EGD 11/19/2018 - Normal duodenal bulb and second portion of the duodenum. - Gastric antral vascular ectasia with bleeding. Treated with argon plasma coagulation (APC). - Normal gastroesophageal junction and esophagus. - No specimens collected.  EGD 08/03/2018 - Normal duodenal bulb and second portion of the duodenum. - Portal hypertensive gastropathy. - Bleeding large (> 5 mm) esophageal varices, ligation was unsuccessful. - No specimens collected.  EGD 04/22/2018 - Normal duodenal bulb and second portion of the duodenum. - Non-bleeding gastric ulcer with a clean ulcer base (Forrest Class III). - Portal hypertensive gastropathy. - Large (> 5 mm) esophageal varices. Incompletely eradicated. Banded x 2. - No specimens collected.  EGD 03/25/2018 - Two non-bleeding angioectasias in the duodenum. Treated with argon plasma coagulation (APC). - A few non-bleeding angioectasias in the stomach. Treated with argon plasma coagulation (APC). - Non-bleeding gastric ulcers with a clean ulcer base (Forrest Class III). - Non-bleeding large (> 5 mm) esophageal varices. Incompletely eradicated. Banded. - No specimens collected.   Colonoscopy 03/25/2018 - One 5 mm polyp in the ascending colon, removed with a cold snare. Resected and retrieved. - Rectal varices. - External hemorrhoids. - The examination was otherwise normal.  DIAGNOSIS:  A. COLON POLYP, ASCENDING; COLD SNARE:  - TUBULAR ADENOMA.  - NEGATIVE FOR HIGH-GRADE DYSPLASIA AND MALIGNANCY.  EGD 02/09/18 The examined duodenum was  normal. The stomach was normal. Five columns of non-bleeding grade III varices were found in the lower third of the esophagus,. Stigmata of recent bleeding were evident and red wale signs were present. Ten bands were successfully placed with incomplete eradication of varices. There was no bleeding during, and at the end, of the procedure.  Past Medical History:  Diagnosis Date  . Acute GI bleeding 02/09/2018  . Acute upper gastrointestinal bleeding 08/03/2018  . Anemia    TAKES IRON TAB  . Arthritis    SHOULDER  . B12 deficiency 02/18/2018  . Bleeding    GI  3/19  . Bronchitis    HX OF  . CHF (congestive heart failure) ([address])   . Cirrhosis ([place])   . Colon cancer screening   . Diabetes mellitus without complication ([place])    TYPE 2  . Fatty (change of) liver, not elsewhere classified   . Full dentures    UPPER AND LOWER  . Iron deficiency anemia due to chronic blood loss 02/18/2018  . Lung nodule, multiple   . Pernicious anemia 02/22/2018  . PUD (peptic ulcer disease)   . Raynaud's syndrome   . Upper GI bleeding 08/03/2018    Past Surgical History:  Procedure Laterality Date  . CATARACT EXTRACTION W/PHACO Right 02/06/2018   Procedure: CATARACT EXTRACTION PHACO AND INTRAOCULAR LENS PLACEMENT ([place]) RIGHT DIABETIC;  Surgeon: Leandrew Koyanagi, MD;  Location: [facility];  Service: Ophthalmology;  Laterality: Right;  DIABETIC, ORAL MED  . CATARACT EXTRACTION W/PHACO Left 04/30/2018  Procedure: CATARACT EXTRACTION PHACO AND INTRAOCULAR LENS PLACEMENT ([facility]);  Surgeon: Leandrew Koyanagi, MD;  Location: [facility];  Service: Ophthalmology;  Laterality: Left;  [place] 01:04 AP% 21.3 CDE 13.66 Fluid pack lot # [id number]  . COLONOSCOPY WITH PROPOFOL N/A 03/25/2018   Procedure: COLONOSCOPY WITH PROPOFOL;  Surgeon: Lin Landsman, MD;  Location: [facility] ENDOSCOPY;  Service: Gastroenterology;  Laterality: N/A;  . DENTAL SURGERY     EXTRACTIONS  . ELECTROMAGNETIC NAVIGATION BROCHOSCOPY  Right 01/27/2019   Procedure: ELECTROMAGNETIC NAVIGATION BRONCHOSCOPY;  Surgeon: [name], MD;  Location: [facility];  Service: Cardiopulmonary;  Laterality: Right;  . ESOPHAGOGASTRODUODENOSCOPY N/A 08/03/2018   Procedure: ESOPHAGOGASTRODUODENOSCOPY (EGD);  Surgeon: Lin Landsman, MD;  Location: [facility] ENDOSCOPY;  Service: Gastroenterology;  Laterality: N/A;  . ESOPHAGOGASTRODUODENOSCOPY (EGD) WITH PROPOFOL N/A 02/10/2018   Procedure: ESOPHAGOGASTRODUODENOSCOPY (EGD) WITH PROPOFOL;  Surgeon: Jonathon Bellows, MD;  Location: [facility] ENDOSCOPY;  Service: Gastroenterology;  Laterality: N/A;  . ESOPHAGOGASTRODUODENOSCOPY (EGD) WITH PROPOFOL N/A 03/25/2018   Procedure: ESOPHAGOGASTRODUODENOSCOPY (EGD) WITH PROPOFOL;  Surgeon: Lin Landsman, MD;  Location: [facility] ENDOSCOPY;  Service: Gastroenterology;  Laterality: N/A;  . ESOPHAGOGASTRODUODENOSCOPY (EGD) WITH PROPOFOL N/A 04/22/2018   Procedure: ESOPHAGOGASTRODUODENOSCOPY (EGD) WITH PROPOFOL with band ligation;  Surgeon: Lin Landsman, MD;  Location: [place];  Service: Gastroenterology;  Laterality: N/A;  . ESOPHAGOGASTRODUODENOSCOPY (EGD) WITH PROPOFOL N/A 06/24/2018   Procedure: ESOPHAGOGASTRODUODENOSCOPY (EGD) WITH PROPOFOL;  Surgeon: Lin Landsman, MD;  Location: [facility] ENDOSCOPY;  Service: Gastroenterology;  Laterality: N/A;  . ESOPHAGOGASTRODUODENOSCOPY (EGD) WITH PROPOFOL N/A 11/19/2018   Procedure: ESOPHAGOGASTRODUODENOSCOPY (EGD) WITH PROPOFOL;  Surgeon: Lin Landsman, MD;  Location: [facility] ENDOSCOPY;  Service: Gastroenterology;  Laterality: N/A;  . ESOPHAGOGASTRODUODENOSCOPY (EGD) WITH PROPOFOL N/A 03/08/2020   Procedure: ESOPHAGOGASTRODUODENOSCOPY (EGD) WITH PROPOFOL;  Surgeon: Lin Landsman, MD;  Location: [facility] ENDOSCOPY;  Service: Gastroenterology;  Laterality: N/A;  . EYE SURGERY    . GIVENS CAPSULE STUDY N/A 03/29/2020   Procedure: GIVENS CAPSULE STUDY;  Surgeon: Lin Landsman, MD;  Location: [facility] ENDOSCOPY;   Service: Gastroenterology;  Laterality: N/A;  . HEMORRHOID BANDING  03/25/2018   Procedure: HEMORRHOID BANDING;  Surgeon: Lin Landsman, MD;  Location: [facility] ENDOSCOPY;  Service: Gastroenterology;;  . IR EMBO ART  VEN HEMORR LYMPH EXTRAV  INC GUIDE ROADMAPPING  08/07/2018  . IR RADIOLOGIST EVAL & MGMT  09/03/2018  . IR RADIOLOGIST EVAL & MGMT  12/17/2018  . IR RADIOLOGIST EVAL & MGMT  06/24/2019  . IR RADIOLOGIST EVAL & MGMT  01/22/2020  . IR TIPS  08/07/2018  . RADIOLOGY WITH ANESTHESIA N/A 08/07/2018   Procedure: TIPS;  Surgeon: Corrie Mckusick, DO;  Location: [place];  Service: Anesthesiology;  Laterality: N/A;  . TONSILLECTOMY       Current Outpatient Medications:  .  albuterol (VENTOLIN HFA) 108 (90 Base) MCG/ACT inhaler, Inhale 2 puffs into the lungs every 4 (four) hours as needed., Disp: , Rfl:  .  Cholecalciferol (VITAMIN D3) 1.25 MG (50000 UT) CAPS, Take 1 capsule by mouth once a week., Disp: , Rfl:  .  cyanocobalamin (,VITAMIN B-12,) 1000 MCG/ML injection, Inject 1,000 mcg into the muscle every 30 (thirty) days. , Disp: , Rfl:  .  furosemide (LASIX) 20 MG tablet, Take 3 tablets (60 mg total) by mouth daily., Disp: 90 tablet, Rfl: 2 .  hydrOXYzine (ATARAX/VISTARIL) 10 MG tablet, TAKE 1 TABLET (10 MG TOTAL) BY MOUTH 3 (THREE) TIMES DAILY AS NEEDED FOR ITCHING., Disp: 30 tablet, Rfl: 0 .  hydrOXYzine (ATARAX/VISTARIL) 25 MG  tablet, Take 25 mg by mouth 3 (three) times daily., Disp: , Rfl:  .  lactulose (CHRONULAC) 10 GM/15ML solution, Take 30 mLs (20 g total) by mouth 2 (two) times daily., Disp: 1892 mL, Rfl: 3 .  Misc. Devices KIT, Moderate compression hose 20-30 MM HG, Disp: 1 kit, Rfl: 0 .  nystatin (MYCOSTATIN/NYSTOP) powder, Apply 1 g topically as needed (rash). , Disp: , Rfl:  .  ondansetron (ZOFRAN) 4 MG tablet, ondansetron HCl 4 mg tablet  TAKE 1 TABLET BY MOUTH EVERY 6 HOURS AS NEEDED, Disp: , Rfl:  .  OneTouch Delica Lancets 16R MISC, 2 (two) times daily., Disp: , Rfl:  .   ONETOUCH ULTRA test strip, 2 (two) times daily., Disp: , Rfl:  .  pantoprazole (PROTONIX) 40 MG tablet, Take 40 mg by mouth 2 (two) times daily., Disp: , Rfl:  .  spironolactone (ALDACTONE) 100 MG tablet, TAKE 1 TABLET BY MOUTH EVERY DAY, Disp: 30 tablet, Rfl: 0 .  XIFAXAN 550 MG TABS tablet, Take 550 mg by mouth 2 (two) times daily., Disp: , Rfl:    Family History  Problem Relation Age of Onset  . CAD Father   . Heart attack Father 35  . Cancer Maternal Uncle   . Seizures Mother      Social History   Tobacco Use  . Smoking status: Never Smoker  . Smokeless tobacco: Never Used  Vaping Use  . Vaping Use: Never used  Substance Use Topics  . Alcohol use: Not Currently    Comment: No EtOH for 30 years.  Never a heavy drinker.  . Drug use: Never    Allergies as of 07/13/2020  . (No Known Allergies)    Review of Systems:    All systems reviewed and negative except where noted in HPI.   Physical Exam:  BP 133/68 (BP Location: Left Arm, Patient Position: Sitting, Cuff Size: Normal)   Pulse 76   Temp 97.9 F (36.6 C) (Oral)   Wt 189 lb (85.7 kg)   LMP  (LMP Unknown)   BMI 32.44 kg/m  No LMP recorded (lmp unknown). Patient is postmenopausal.  General:   Alert,  Well-developed, well-nourished, pleasant and cooperative in NAD, lethargic Head:  Normocephalic and atraumatic. Eyes:  Sclera clear, no icterus.   Conjunctiva pink. Ears:  Normal auditory acuity. Nose:  No deformity, discharge, or lesions. Mouth:  No deformity or lesions,oropharynx pink & moist. Neck:  Supple; no masses or thyromegaly. Lungs:  Respirations even and unlabored.  Clear throughout to auscultation.   No wheezes, crackles, or rhonchi. No acute distress. Heart:  Regular rate and rhythm; no murmurs, clicks, rubs, or gallops. Abdomen:  Normal bowel sounds. Soft, non-tender and nondistended, without masses, hepatosplenomegaly or hernias noted.  No guarding or rebound tenderness.   Rectal: Not performed Msk:   Symmetrical without gross deformities. muscle wasting in bilateral upper extremities Good, equal movement & strength bilaterally. Pulses:  Normal pulses noted. Extremities:  No clubbing, 2+ edema, no cyanosis Neurologic:  Alert and oriented x3;  grossly normal neurologically. Skin: telangiectasias on upper anterior chest, the rash in her bilateral groin appears like a erythematous ring with central clearing, dry scaly margins Lymph Nodes:  No significant cervical adenopathy. Psych:  Alert and cooperative. Normal mood and affect.  Imaging Studies: Reviewed. CT A/P with contrast on 02/09/2018 revealed mildly enlarged retroperitoneal lymph nodes, largest with 11 mm concerning for metastatic disease or malignancy,18 mm spiculated density in the right lower lobe concerning for malignancy  associated with small left pleural effusion  Assessment and Plan:   Lylee A Ledwith is a 69 y.o. Caucasian female with metabolic syndrome, with decompensated cirrhosis secondary to ascites and refractory variceal bleed from esophageal varices status post TIPS and coil embolization of the left gastric vein here for follow-up  Decompensated cirrhosis: child class B, MELD-Na 9 Secondary liver disease workup positive for HCV antibodies, but viral load not detected, anti-smooth muscle antibodies weakly positive. 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 Ceruloplasmin, antimitochondrial antibodies, hepatitis B, alpha-1 antitrypsin came back negative Portal hypertension: manifested as ascites, esophageal varices, splenomegaly.  Patient currently has no ascites since placement of TIPS, controlled on low-sodium diet S/p TIPS: Secondary to refractory variceal bleed. Follow-up with interventional radiology every 6 months.  TIPS patent Volume overload: No evidence of ascites, does have significant bilateral swelling of legs.  Patient was evaluated by nephrologist, Dr. Jesse Sans for  chronic kidney disease.  Recommendation was made to continue continue Lasix 60 mg daily and spironolactone to 100 mg daily.  Reiterated on low-sodium diet. Suggested her to check weight daily. Continue low-sodium diet. Avoid processed meats, and foods, chips, carbonated beverages.  Patient is also evaluated by cardiology, stable cardiac function, advised no further intervention at this time.  Ultrasound Dopplers revealed patent TIPS.  She has appointment with nephrologist this week Varices: variceal bleed from esophageal varices, status post EGD on 02/10/2018, EVLx10, status post EGD 03/25/2018 EVLx4, s/p EGD 04/22/18 EVLx2 and hemospray, refractory variceal bleed 08/03/2018, status post TIPS with coil embolization of left gastric vein on 08/07/2018.  Stopped propranolol  Repeat EGD in 11/2018 revealed no varices at all, GAVE treated with APC.   Pruritus: Continue hydroxyzine 10 mg 3 times a day as needed most likely secondary to cholestasis.  Stable alkaline phosphatase, increase hydroxyzine to 20 mg if needed  Anemia: Worsening, iron deficient secondary to intermittent chronic blood loss likely from combination of AVMs, portal hypertensive gastropathy, GAVE.  Video capsule endoscopy revealed possible small bowel AVMs.  Therefore, recommend push enteroscopy due to worsening anemia.  There is no evidence of coagulopathy.  She has mild thrombocytopenia HCC screening: AFP normal, no liver lesions on CT in 07/2018.  Repeat ultrasound in 05/2019 with no liver lesions. MRI liver mass protocol with MRCP in 10/2019 was unremarkable.  Ultrasound liver Doppler in 05/2020 normal.  PSE: Started on lactulose post TIPS.  Continue rifaximin 550 mg twice daily, long-term.  Chronic kidney disease: Does have declining renal function since 01/2020, GFR between 30 and 40, follow-up with nephrology Hepatic hydrothorax: none   Health maintenance:  She is immune to hepatitis B She received Pneumovax and influenza vaccine in  07/2018 vitamin D levels 29.2 on 03/15/2018 Avoid excess Tylenol and NSAIDs use Colon cancer screening:underwent colonoscopy on 04/11/2018, One 5 mm polyp in the ascending colon, removed with a cold snare, Patch showed tubular adenoma with no evidence of high-grade dysplasia or malignancy. Recommend repeat colonoscopy in 5 years Right lower lobe lung lesion and retroperitoneal lymphadenopathy: follow-up with Dr. Mike Gip, no evidence of retroperitoneal lymphadenopathy. Repeat CT chest revealed that the lesions were stable.  Patient underwent bronchoscopy, sample was limited, no evidence of malignancy.  Plan to monitor the lesion with imaging for now  Follow up in 4 to 6 weeks  [name], MD

## 2020-07-14 ENCOUNTER — Inpatient Hospital Stay: Payer: Medicare HMO | Attending: Hematology and Oncology

## 2020-07-14 ENCOUNTER — Telehealth: Payer: Self-pay

## 2020-07-14 VITALS — BP 134/64 | HR 70 | Temp 98.8°F | Resp 16

## 2020-07-14 DIAGNOSIS — E538 Deficiency of other specified B group vitamins: Secondary | ICD-10-CM | POA: Insufficient documentation

## 2020-07-14 DIAGNOSIS — R69 Illness, unspecified: Secondary | ICD-10-CM | POA: Diagnosis not present

## 2020-07-14 DIAGNOSIS — D500 Iron deficiency anemia secondary to blood loss (chronic): Secondary | ICD-10-CM

## 2020-07-14 DIAGNOSIS — D509 Iron deficiency anemia, unspecified: Secondary | ICD-10-CM | POA: Insufficient documentation

## 2020-07-14 MED ORDER — IRON SUCROSE 20 MG/ML IV SOLN: 200.0000 mg | Freq: Once | INTRAVENOUS | Status: DC

## 2020-07-14 MED ORDER — SODIUM CHLORIDE 0.9 % IV SOLN
Freq: Once | INTRAVENOUS | Status: DC
  Filled 2020-07-14: qty 250

## 2020-07-14 NOTE — Telephone Encounter (Signed)
Called patient and moved patient to 07/28/2020. Informed patient she would go for covid test now on 07/26/2020. Patient verbalized understanding

## 2020-07-14 NOTE — Telephone Encounter (Signed)
Called and left a message to move patient push endoscopy that Is scheduled for 07/26/2020. Dr. Marius Ditch said she needed this move because she has a dentist appointment at 12:30

## 2020-07-15 DIAGNOSIS — R600 Localized edema: Secondary | ICD-10-CM | POA: Diagnosis not present

## 2020-07-15 DIAGNOSIS — D631 Anemia in chronic kidney disease: Secondary | ICD-10-CM | POA: Diagnosis not present

## 2020-07-15 DIAGNOSIS — N184 Chronic kidney disease, stage 4 (severe): Secondary | ICD-10-CM | POA: Diagnosis not present

## 2020-07-16 DIAGNOSIS — M5489 Other dorsalgia: Secondary | ICD-10-CM | POA: Diagnosis not present

## 2020-07-16 DIAGNOSIS — E119 Type 2 diabetes mellitus without complications: Secondary | ICD-10-CM | POA: Diagnosis not present

## 2020-07-16 DIAGNOSIS — E669 Obesity, unspecified: Secondary | ICD-10-CM | POA: Diagnosis not present

## 2020-07-16 DIAGNOSIS — N184 Chronic kidney disease, stage 4 (severe): Secondary | ICD-10-CM | POA: Diagnosis not present

## 2020-07-16 DIAGNOSIS — I10 Essential (primary) hypertension: Secondary | ICD-10-CM | POA: Diagnosis not present

## 2020-07-16 DIAGNOSIS — E700 Classical phenylketonuria: Secondary | ICD-10-CM | POA: Diagnosis not present

## 2020-07-17 ENCOUNTER — Other Ambulatory Visit: Payer: Self-pay | Admitting: Gastroenterology

## 2020-07-19 DIAGNOSIS — R69 Illness, unspecified: Secondary | ICD-10-CM | POA: Diagnosis not present

## 2020-07-20 ENCOUNTER — Other Ambulatory Visit: Payer: Self-pay

## 2020-07-20 ENCOUNTER — Inpatient Hospital Stay: Payer: Medicare HMO

## 2020-07-20 DIAGNOSIS — E538 Deficiency of other specified B group vitamins: Secondary | ICD-10-CM | POA: Diagnosis not present

## 2020-07-20 DIAGNOSIS — D509 Iron deficiency anemia, unspecified: Secondary | ICD-10-CM | POA: Diagnosis present

## 2020-07-20 MED ORDER — CYANOCOBALAMIN 1000 MCG/ML IJ SOLN
1000.0000 ug | Freq: Once | INTRAMUSCULAR | Status: AC
  Administered 2020-07-20: 1000 ug via INTRAMUSCULAR

## 2020-07-20 MED ORDER — CYANOCOBALAMIN 1000 MCG/ML IJ SOLN
INTRAMUSCULAR | Status: AC
  Filled 2020-07-20: qty 1

## 2020-07-20 NOTE — Telephone Encounter (Signed)
Last office visit 07/13/2020 cirrhosis  Last refill 06/22/2020 0 refills  No appointment is scheduled

## 2020-07-21 ENCOUNTER — Inpatient Hospital Stay: Payer: Medicare HMO

## 2020-07-21 ENCOUNTER — Telehealth: Payer: Self-pay

## 2020-07-21 DIAGNOSIS — E538 Deficiency of other specified B group vitamins: Secondary | ICD-10-CM | POA: Diagnosis not present

## 2020-07-21 DIAGNOSIS — D509 Iron deficiency anemia, unspecified: Secondary | ICD-10-CM | POA: Diagnosis not present

## 2020-07-21 DIAGNOSIS — D500 Iron deficiency anemia secondary to blood loss (chronic): Secondary | ICD-10-CM

## 2020-07-21 MED ORDER — SODIUM CHLORIDE 0.9 % IV SOLN
INTRAVENOUS | Status: DC
  Filled 2020-07-21: qty 250

## 2020-07-21 MED ORDER — IRON SUCROSE 20 MG/ML IV SOLN
200.0000 mg | Freq: Once | INTRAVENOUS | Status: AC
  Administered 2020-07-21: 200 mg via INTRAVENOUS
  Filled 2020-07-21: qty 10

## 2020-07-21 NOTE — Telephone Encounter (Signed)
Faxed port placement request to  [facility].

## 2020-07-21 NOTE — Progress Notes (Signed)
IV started in left AC, great blood return, securely taped. 1425 pt c/o pain at site, no swelling or redness noted, pt states she called about getting a port, states she is usually stuck 3-4 times and sometimes can not get her iron due to poor access. Spoke with Dr Mike Gip, she is faxing a referral for port placement.

## 2020-07-22 ENCOUNTER — Telehealth: Payer: Self-pay

## 2020-07-22 ENCOUNTER — Telehealth (INDEPENDENT_AMBULATORY_CARE_PROVIDER_SITE_OTHER): Payer: Self-pay

## 2020-07-22 NOTE — Telephone Encounter (Signed)
Patient is calling wanting to know if you would keep her procedure for a Push enteroscopy on 07/28/2020. She states she is getting a port put in on 07/30/2020. Please advised

## 2020-07-22 NOTE — Telephone Encounter (Signed)
Patient verbalized understand she will keep the appointment

## 2020-07-22 NOTE — Telephone Encounter (Signed)
Yes, we should still do it  RV

## 2020-07-22 NOTE — Telephone Encounter (Signed)
Spoke with the patient and she is scheduled with Dr. Delana Meyer for a port placement on 07/30/20 with a 10:30 am arrival time to the [facility]. Covid testing is technically on Monday for surgery with another surgeon. Patient was asked to stay quarantined until her procedure with Dr. Delana Meyer. Pre-procedure instructions were discussed and will be mailed.

## 2020-07-26 ENCOUNTER — Other Ambulatory Visit
Admission: RE | Admit: 2020-07-26 | Discharge: 2020-07-26 | Disposition: A | Discharge status: Home / Self Care | Payer: Medicare HMO | Source: Ambulatory Visit | Attending: Vascular Surgery | Admitting: Vascular Surgery

## 2020-07-26 ENCOUNTER — Other Ambulatory Visit: Payer: Self-pay

## 2020-07-26 DIAGNOSIS — Z01812 Encounter for preprocedural laboratory examination: Secondary | ICD-10-CM | POA: Insufficient documentation

## 2020-07-26 DIAGNOSIS — Z20822 Contact with and (suspected) exposure to covid-19: Secondary | ICD-10-CM | POA: Insufficient documentation

## 2020-07-26 LAB — SARS CORONAVIRUS 2 (TAT 6-24 HRS): SARS Coronavirus 2: NEGATIVE

## 2020-07-27 ENCOUNTER — Encounter: Payer: Self-pay | Admitting: Gastroenterology

## 2020-07-28 ENCOUNTER — Encounter
Admission: RE | Disposition: A | Discharge status: Home / Self Care | Payer: Self-pay | Source: Home / Self Care | Attending: Gastroenterology

## 2020-07-28 ENCOUNTER — Ambulatory Visit: Payer: Medicare HMO | Admitting: Anesthesiology

## 2020-07-28 ENCOUNTER — Ambulatory Visit
Admission: RE | Admit: 2020-07-28 | Discharge: 2020-07-28 | Disposition: A | Discharge status: Home / Self Care | Payer: Medicare HMO | Attending: Gastroenterology | Admitting: Gastroenterology

## 2020-07-28 ENCOUNTER — Encounter: Payer: Self-pay | Admitting: Gastroenterology

## 2020-07-28 DIAGNOSIS — K922 Gastrointestinal hemorrhage, unspecified: Secondary | ICD-10-CM | POA: Diagnosis present

## 2020-07-28 DIAGNOSIS — K7460 Unspecified cirrhosis of liver: Secondary | ICD-10-CM | POA: Diagnosis not present

## 2020-07-28 DIAGNOSIS — D500 Iron deficiency anemia secondary to blood loss (chronic): Secondary | ICD-10-CM

## 2020-07-28 DIAGNOSIS — Z8711 Personal history of peptic ulcer disease: Secondary | ICD-10-CM | POA: Diagnosis not present

## 2020-07-28 DIAGNOSIS — E119 Type 2 diabetes mellitus without complications: Secondary | ICD-10-CM | POA: Diagnosis not present

## 2020-07-28 DIAGNOSIS — Z79899 Other long term (current) drug therapy: Secondary | ICD-10-CM | POA: Insufficient documentation

## 2020-07-28 DIAGNOSIS — E538 Deficiency of other specified B group vitamins: Secondary | ICD-10-CM | POA: Diagnosis not present

## 2020-07-28 DIAGNOSIS — D509 Iron deficiency anemia, unspecified: Secondary | ICD-10-CM

## 2020-07-28 DIAGNOSIS — K760 Fatty (change of) liver, not elsewhere classified: Secondary | ICD-10-CM | POA: Diagnosis not present

## 2020-07-28 DIAGNOSIS — K558 Other vascular disorders of intestine: Secondary | ICD-10-CM

## 2020-07-28 DIAGNOSIS — K31819 Angiodysplasia of stomach and duodenum without bleeding: Secondary | ICD-10-CM | POA: Insufficient documentation

## 2020-07-28 DIAGNOSIS — M19019 Primary osteoarthritis, unspecified shoulder: Secondary | ICD-10-CM | POA: Insufficient documentation

## 2020-07-28 DIAGNOSIS — K5520 Angiodysplasia of colon without hemorrhage: Secondary | ICD-10-CM

## 2020-07-28 DIAGNOSIS — I509 Heart failure, unspecified: Secondary | ICD-10-CM | POA: Diagnosis not present

## 2020-07-28 HISTORY — PX: ENTEROSCOPY: SHX5533

## 2020-07-28 LAB — GLUCOSE, CAPILLARY
Glucose-Capillary: 64 mg/dL — ABNORMAL LOW (ref 70–99)
Glucose-Capillary: 73 mg/dL (ref 70–99)
Glucose-Capillary: 102 mg/dL — ABNORMAL HIGH (ref 70–99)

## 2020-07-28 SURGERY — ENTEROSCOPY
Anesthesia: General

## 2020-07-28 MED ORDER — DEXTROSE 50 % IV SOLN
INTRAVENOUS | Status: AC
  Administered 2020-07-28: 12.5 g via INTRAVENOUS
  Filled 2020-07-28: qty 50

## 2020-07-28 MED ORDER — DEXTROSE 50 % IV SOLN: 12.5000 g | Freq: Once | INTRAVENOUS | Status: AC

## 2020-07-28 MED ORDER — FENTANYL CITRATE (PF) 100 MCG/2ML IJ SOLN
INTRAMUSCULAR | Status: AC
  Filled 2020-07-28: qty 2

## 2020-07-28 MED ORDER — PROPOFOL 10 MG/ML IV BOLUS
INTRAVENOUS | Status: DC | PRN
  Administered 2020-07-28 (×4): 20 mg via INTRAVENOUS
  Administered 2020-07-28: 50 mg via INTRAVENOUS
  Administered 2020-07-28 (×2): 20 mg via INTRAVENOUS

## 2020-07-28 MED ORDER — PROPOFOL 10 MG/ML IV BOLUS
INTRAVENOUS | Status: AC
  Filled 2020-07-28: qty 40

## 2020-07-28 MED ORDER — LIDOCAINE HCL (CARDIAC) PF 100 MG/5ML IV SOSY
PREFILLED_SYRINGE | INTRAVENOUS | Status: DC | PRN
  Administered 2020-07-28: 50 mg via INTRAVENOUS

## 2020-07-28 MED ORDER — PROPOFOL 500 MG/50ML IV EMUL
INTRAVENOUS | Status: DC | PRN
  Administered 2020-07-28: 125 ug/kg/min via INTRAVENOUS

## 2020-07-28 MED ORDER — LIDOCAINE HCL (PF) 2 % IJ SOLN
INTRAMUSCULAR | Status: AC
  Filled 2020-07-28: qty 5

## 2020-07-28 MED ORDER — SODIUM CHLORIDE 0.9 % IV SOLN
INTRAVENOUS | Status: DC
  Administered 2020-07-28: 20 mL/h via INTRAVENOUS

## 2020-07-28 MED ORDER — FENTANYL CITRATE (PF) 100 MCG/2ML IJ SOLN
INTRAMUSCULAR | Status: DC | PRN
Start: 2020-07-28 — End: 2020-07-28
  Administered 2020-07-28: 50 ug via INTRAVENOUS

## 2020-07-28 NOTE — Anesthesia Preprocedure Evaluation (Signed)
Anesthesia Evaluation  Patient identified by MRN, date of birth, ID band Patient awake    Reviewed: Allergy & Precautions, H&P , NPO status , Patient's Chart, lab work & pertinent test results, reviewed documented beta blocker date and time   Airway Mallampati: II   Neck ROM: full    Dental  (+) Poor Dentition   Pulmonary neg pulmonary ROS,    Pulmonary exam normal        Cardiovascular Exercise Tolerance: Poor (-) angina+ CAD and +CHF  Normal cardiovascular exam Rhythm:regular Rate:Normal     Neuro/Psych negative neurological ROS  negative psych ROS   GI/Hepatic PUD, (+) Hepatitis -  Endo/Other  negative endocrine ROSdiabetes  Renal/GU negative Renal ROS  negative genitourinary   Musculoskeletal   Abdominal   Peds  Hematology  (+) Blood dyscrasia, anemia ,   Anesthesia Other Findings Past Medical History: 02/09/2018: Acute GI bleeding 08/03/2018: Acute upper gastrointestinal bleeding No date: Anemia     Comment:  TAKES IRON TAB No date: Arthritis     Comment:  SHOULDER 02/18/2018: B12 deficiency No date: Bleeding     Comment:  GI  3/19 No date: Bronchitis     Comment:  HX OF No date: CHF (congestive heart failure) (HCC) No date: Cirrhosis ([place]) No date: Colon cancer screening No date: Diabetes mellitus without complication ([place])     Comment:  TYPE 2 No date: Fatty (change of) liver, not elsewhere classified No date: Full dentures     Comment:  UPPER AND LOWER 02/18/2018: Iron deficiency anemia due to chronic blood loss No date: Lung nodule, multiple 02/22/2018: Pernicious anemia No date: PUD (peptic ulcer disease) No date: Raynaud's syndrome 08/03/2018: Upper GI bleeding Past Surgical History: 02/06/2018: CATARACT EXTRACTION W/PHACO; Right     Comment:  Procedure: CATARACT EXTRACTION PHACO AND INTRAOCULAR               LENS PLACEMENT ([place]) RIGHT DIABETIC;  Surgeon:               Leandrew Koyanagi, MD;   Location: [place];              Service: Ophthalmology;  Laterality: Right;  DIABETIC,               ORAL MED 04/30/2018: CATARACT EXTRACTION W/PHACO; Left     Comment:  Procedure: CATARACT EXTRACTION PHACO AND INTRAOCULAR               LENS PLACEMENT ([facility]);  Surgeon: Leandrew Koyanagi, MD;              Location: [facility];  Service: Ophthalmology;  Laterality:              Left;  [place] 01:04 AP% 21.3 CDE 13.66 Fluid pack lot #               [id number] 03/25/2018: COLONOSCOPY WITH PROPOFOL; N/A     Comment:  Procedure: COLONOSCOPY WITH PROPOFOL;  Surgeon: Lin Landsman, MD;  Location: [facility];  Service:               Gastroenterology;  Laterality: N/A; No date: DENTAL SURGERY     Comment:  EXTRACTIONS 01/27/2019: ELECTROMAGNETIC NAVIGATION BROCHOSCOPY; Right     Comment:  Procedure: ELECTROMAGNETIC NAVIGATION BRONCHOSCOPY;                Surgeon: [name], MD;  Location: [facility];  Service:              Cardiopulmonary;  Laterality: Right; 08/03/2018: ESOPHAGOGASTRODUODENOSCOPY; N/A     Comment:  Procedure: ESOPHAGOGASTRODUODENOSCOPY (EGD);  Surgeon:               Lin Landsman, MD;  Location: [facility] ENDOSCOPY;                Service: Gastroenterology;  Laterality: N/A; 02/10/2018: ESOPHAGOGASTRODUODENOSCOPY (EGD) WITH PROPOFOL; N/A     Comment:  Procedure: ESOPHAGOGASTRODUODENOSCOPY (EGD) WITH               PROPOFOL;  Surgeon: Jonathon Bellows, MD;  Location: [facility]               ENDOSCOPY;  Service: Gastroenterology;  Laterality: N/A; 03/25/2018: ESOPHAGOGASTRODUODENOSCOPY (EGD) WITH PROPOFOL; N/A     Comment:  Procedure: ESOPHAGOGASTRODUODENOSCOPY (EGD) WITH               PROPOFOL;  Surgeon: Lin Landsman, MD;  Location:               [facility];  Service: Gastroenterology;  Laterality:               N/A; 04/22/2018: ESOPHAGOGASTRODUODENOSCOPY (EGD) WITH PROPOFOL; N/A     Comment:  Procedure: ESOPHAGOGASTRODUODENOSCOPY (EGD) WITH                PROPOFOL with band ligation;  Surgeon: Lin Landsman, MD;  Location: [place];  Service:               Gastroenterology;  Laterality: N/A; 06/24/2018: ESOPHAGOGASTRODUODENOSCOPY (EGD) WITH PROPOFOL; N/A     Comment:  Procedure: ESOPHAGOGASTRODUODENOSCOPY (EGD) WITH               PROPOFOL;  Surgeon: Lin Landsman, MD;  Location:               [facility];  Service: Gastroenterology;  Laterality:               N/A; 11/19/2018: ESOPHAGOGASTRODUODENOSCOPY (EGD) WITH PROPOFOL; N/A     Comment:  Procedure: ESOPHAGOGASTRODUODENOSCOPY (EGD) WITH               PROPOFOL;  Surgeon: Lin Landsman, MD;  Location:               [facility];  Service: Gastroenterology;  Laterality:               N/A; 03/08/2020: ESOPHAGOGASTRODUODENOSCOPY (EGD) WITH PROPOFOL; N/A     Comment:  Procedure: ESOPHAGOGASTRODUODENOSCOPY (EGD) WITH               PROPOFOL;  Surgeon: Lin Landsman, MD;  Location:               [facility];  Service: Gastroenterology;  Laterality:               N/A; No date: EYE SURGERY 03/29/2020: GIVENS CAPSULE STUDY; N/A     Comment:  Procedure: GIVENS CAPSULE STUDY;  Surgeon: Lin Landsman, MD;  Location: [facility];  Service:               Gastroenterology;  Laterality: N/A; 03/25/2018: HEMORRHOID BANDING     Comment:  Procedure: HEMORRHOID BANDING;  Surgeon: Sherri Sear  Reece Levy, MD;  Location: [place];  Service:               Gastroenterology;; 08/07/2018: IR EMBO ART  VEN HEMORR LYMPH EXTRAV  INC GUIDE ROADMAPPING 09/03/2018: IR RADIOLOGIST EVAL & MGMT 12/17/2018: IR RADIOLOGIST EVAL & MGMT 06/24/2019: IR RADIOLOGIST EVAL & MGMT 01/22/2020: IR RADIOLOGIST EVAL & MGMT 08/07/2018: IR TIPS 08/07/2018: RADIOLOGY WITH ANESTHESIA; N/A     Comment:  Procedure: TIPS;  Surgeon: Corrie Mckusick, DO;  Location:              [place];  Service: Anesthesiology;  Laterality: N/A; No date: TONSILLECTOMY BMI     Body Mass Index: 33.66 kg/m     Reproductive/Obstetrics negative OB ROS                             Anesthesia Physical Anesthesia Plan  ASA: IV  Anesthesia Plan: General   Post-op Pain Management:    Induction:   PONV Risk Score and Plan:   Airway Management Planned:   Additional Equipment:   Intra-op Plan:   Post-operative Plan:   Informed Consent: I have reviewed the patients History and Physical, chart, labs and discussed the procedure including the risks, benefits and alternatives for the proposed anesthesia with the patient or authorized representative who has indicated his/her understanding and acceptance.     Dental Advisory Given  Plan Discussed with: CRNA  Anesthesia Plan Comments:         Anesthesia Quick Evaluation

## 2020-07-28 NOTE — Anesthesia Postprocedure Evaluation (Signed)
Anesthesia Post Note  Patient: Keniyah A Odriscoll  Procedure(s) Performed: Push ENTEROSCOPY (N/A )  Patient location during evaluation: Endoscopy Anesthesia Type: General Level of consciousness: awake and alert Pain management: pain level controlled Vital Signs Assessment: post-procedure vital signs reviewed and stable Respiratory status: spontaneous breathing, nonlabored ventilation, respiratory function stable and patient connected to nasal cannula oxygen Cardiovascular status: blood pressure returned to baseline and stable Postop Assessment: no apparent nausea or vomiting Anesthetic complications: no   No complications documented.   Last Vitals:  Vitals:   07/28/20 1223 07/28/20 1233  BP: 127/71 (!) 140/54  Pulse: (!) 55 (!) 54  Resp:    Temp:    SpO2: 98% 99%    Last Pain:  Vitals:   07/28/20 0931  TempSrc: Temporal                 Precious Haws Morayo Leven

## 2020-07-28 NOTE — Transfer of Care (Signed)
Immediate Anesthesia Transfer of Care Note  Patient: Crystal [name]  Procedure(s) Performed: Push ENTEROSCOPY (N/A )  Patient Location: PACU  Anesthesia Type:General  Level of Consciousness: awake, alert  and oriented  Airway & Oxygen Therapy: Patient Spontanous Breathing and Patient connected to nasal cannula oxygen  Post-op Assessment: Report given to RN and Post -op Vital signs reviewed and stable  Post vital signs: Reviewed and stable  Last Vitals:  Vitals Value Taken Time  BP 135/66 07/28/20 1203  Temp 36.3 C 07/28/20 1203  Pulse 86 07/28/20 1203  Resp 14 07/28/20 1203  SpO2 99 % 07/28/20 1203    Last Pain:  Vitals:   07/28/20 0931  TempSrc: Temporal         Complications: No complications documented.

## 2020-07-28 NOTE — Op Note (Signed)
[facility] Gastroenterology Patient Name: Crystal [name] Procedure Date: 07/28/2020 11:15 AM MRN: [id number] Account #: [id number] Date of Birth: 18-Aug-1951 Admit Type: Outpatient Age: 69 Room: [facility] ENDO ROOM 1 Gender: Female Note Status: Finalized Procedure:             Small bowel enteroscopy Indications:           GI bleeding source not documented by previous video                         capsule endoscopy, Obscure gastrointestinal bleeding Providers:             Lin Landsman MD, MD Medicines:             Monitored Anesthesia Care Complications:         No immediate complications. Estimated blood loss:                         Minimal. Procedure:             Pre-Anesthesia Assessment:                        - Prior to the procedure, a History and Physical was                         performed, and patient medications and allergies were                         reviewed. The patient is competent. The risks and                         benefits of the procedure and the sedation options and                         risks were discussed with the patient. All questions                         were answered and informed consent was obtained.                         Patient identification and proposed procedure were                         verified by the physician, the nurse, the                         anesthesiologist, the anesthetist and the technician                         in the pre-procedure area in the procedure room in the                         endoscopy suite. Mental Status Examination: alert and                         oriented. Airway Examination: normal oropharyngeal                         airway and neck mobility. Respiratory Examination:  clear to auscultation. CV Examination: normal.                         Prophylactic Antibiotics: The patient does not require                         prophylactic antibiotics. Prior  Anticoagulants: The                         patient has taken no previous anticoagulant or                         antiplatelet agents. ASA Grade Assessment: III - A                         patient with severe systemic disease. After reviewing                         the risks and benefits, the patient was deemed in                         satisfactory condition to undergo the procedure. The                         anesthesia plan was to use monitored anesthesia care                         (MAC). Immediately prior to administration of                         medications, the patient was re-assessed for adequacy                         to receive sedatives. The heart rate, respiratory                         rate, oxygen saturations, blood pressure, adequacy of                         pulmonary ventilation, and response to care were                         monitored throughout the procedure. The physical                         status of the patient was re-assessed after the                         procedure.                        After obtaining informed consent, the endoscope was                         passed under direct vision. Throughout the procedure,                         the patient's blood pressure, pulse, and oxygen  saturations were monitored continuously. The                         Enteroscope was introduced through the mouth and                         advanced to the mid-jejunum. The small bowel                         enteroscopy was accomplished without difficulty. The                         patient tolerated the procedure well. 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 Coagulation for hemostasis       using argon plasma was successful. For hemostasis, two hemostatic clips       were successfully placed (MR conditional). There was no bleeding at the        end of the procedure.      A few angiodysplastic lesions with no bleeding were found in the entire       examined duodenum. Coagulation for hemostasis using argon plasma was       successful.      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      The gastroesophageal junction and examined esophagus were normal. 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 Treated with argon plasma coagulation (APC).                         Clips (MR conditional) were placed.                        - A few non-bleeding angiodysplastic lesions in the                         duodenum. Treated with argon plasma coagulation (APC).                        - Gastric antral vascular ectasia without bleeding.                        - Normal gastroesophageal junction and esophagus.                        - No specimens collected. Recommendation:        - Discharge patient to home (with escort).                        - Resume previous diet today.                        - Continue present medications.                        - Return to  my office as previously scheduled. Procedure Code(s):     --- Professional ---                        [phone], Small intestinal endoscopy, enteroscopy beyond                         second portion of duodenum, not including ileum; with                         control of bleeding (eg, injection, bipolar cautery,                         unipolar cautery, laser, heater probe, stapler, plasma                         coagulator) Diagnosis Code(s):     --- Professional ---                        P10.25, Angiodysplasia of colon without hemorrhage                        K31.819, Angiodysplasia of stomach and duodenum                         without bleeding                        K92.2, Gastrointestinal hemorrhage, unspecified CPT copyright 2019 American Medical Association. All  rights reserved. The codes documented in this report are preliminary and upon coder review may  be revised to meet current compliance requirements. Dr. Ulyess Mort Lin Landsman MD, MD 07/28/2020 12:00:54 PM This report has been signed electronically. Number of Addenda: 0 Note Initiated On: 07/28/2020 11:15 AM Estimated Blood Loss:  Estimated blood loss was minimal.      [facility]

## 2020-07-28 NOTE — H&P (Signed)
[name], MD [address]  [place]  [place], [place] [address]  Main: [phone]  Fax: [phone] Pager: [phone]  Primary Care Physician:  Lorelee Market, MD Primary Gastroenterologist:  Dr. [name]  Pre-Procedure History & Physical: HPI:  Crystal [name] is a 69 y.o. female is here for an push enteroscopy.   Past Medical History:  Diagnosis Date  . Acute GI bleeding 02/09/2018  . Acute upper gastrointestinal bleeding 08/03/2018  . Anemia    TAKES IRON TAB  . Arthritis    SHOULDER  . B12 deficiency 02/18/2018  . Bleeding    GI  3/19  . Bronchitis    HX OF  . CHF (congestive heart failure) ([place])   . Cirrhosis ([place])   . Colon cancer screening   . Diabetes mellitus without complication ([place])    TYPE 2  . Fatty (change of) liver, not elsewhere classified   . Full dentures    UPPER AND LOWER  . Iron deficiency anemia due to chronic blood loss 02/18/2018  . Lung nodule, multiple   . Pernicious anemia 02/22/2018  . PUD (peptic ulcer disease)   . Raynaud's syndrome   . Upper GI bleeding 08/03/2018    Past Surgical History:  Procedure Laterality Date  . CATARACT EXTRACTION W/PHACO Right 02/06/2018   Procedure: CATARACT EXTRACTION PHACO AND INTRAOCULAR LENS PLACEMENT ([place]) RIGHT DIABETIC;  Surgeon: Leandrew Koyanagi, MD;  Location: [place];  Service: Ophthalmology;  Laterality: Right;  DIABETIC, ORAL MED  . CATARACT EXTRACTION W/PHACO Left 04/30/2018   Procedure: CATARACT EXTRACTION PHACO AND INTRAOCULAR LENS PLACEMENT ([facility]);  Surgeon: Leandrew Koyanagi, MD;  Location: [facility];  Service: Ophthalmology;  Laterality: Left;  [place] 01:04 AP% 21.3 CDE 13.66 Fluid pack lot # [id number]  . COLONOSCOPY WITH PROPOFOL N/A 03/25/2018   Procedure: COLONOSCOPY WITH PROPOFOL;  Surgeon: Lin Landsman, MD;  Location: [facility] ENDOSCOPY;  Service: Gastroenterology;  Laterality: N/A;  . DENTAL SURGERY     EXTRACTIONS  . ELECTROMAGNETIC NAVIGATION  BROCHOSCOPY Right 01/27/2019   Procedure: ELECTROMAGNETIC NAVIGATION BRONCHOSCOPY;  Surgeon: [name], MD;  Location: [facility];  Service: Cardiopulmonary;  Laterality: Right;  . ESOPHAGOGASTRODUODENOSCOPY N/A 08/03/2018   Procedure: ESOPHAGOGASTRODUODENOSCOPY (EGD);  Surgeon: Lin Landsman, MD;  Location: [facility] ENDOSCOPY;  Service: Gastroenterology;  Laterality: N/A;  . ESOPHAGOGASTRODUODENOSCOPY (EGD) WITH PROPOFOL N/A 02/10/2018   Procedure: ESOPHAGOGASTRODUODENOSCOPY (EGD) WITH PROPOFOL;  Surgeon: Jonathon Bellows, MD;  Location: [facility] ENDOSCOPY;  Service: Gastroenterology;  Laterality: N/A;  . ESOPHAGOGASTRODUODENOSCOPY (EGD) WITH PROPOFOL N/A 03/25/2018   Procedure: ESOPHAGOGASTRODUODENOSCOPY (EGD) WITH PROPOFOL;  Surgeon: Lin Landsman, MD;  Location: [facility] END[facility]COPY;  Service: Gastroenterology;  Laterality: N/A;  . ESOPHAGOGASTRODUODENOSCOPY (EGD) WITH PROPOFOL N/A 04/22/2018   Procedure: ESOPHAGOGASTRODUODENOSCOPY (EGD) WITH PROPOFOL with band ligation;  Surgeon: Lin Landsman, MD;  Location: [place];  Service: Gastroenterology;  Laterality: N/A;  . ESOPHAGOGASTRODUODENOSCOPY (EGD) WITH PROPOFOL N/A 06/24/2018   Procedure: ESOPHAGOGASTRODUODENOSCOPY (EGD) WITH PROPOFOL;  Surgeon: Lin Landsman, MD;  Location: [facility] ENDOSCOPY;  Service: Gastroenterology;  Laterality: N/A;  . ESOPHAGOGASTRODUODENOSCOPY (EGD) WITH PROPOFOL N/A 11/19/2018   Procedure: ESOPHAGOGASTRODUODENOSCOPY (EGD) WITH PROPOFOL;  Surgeon: Lin Landsman, MD;  Location: [facility] ENDOSCOPY;  Service: Gastroenterology;  Laterality: N/A;  . ESOPHAGOGASTRODUODENOSCOPY (EGD) WITH PROPOFOL N/A 03/08/2020   Procedure: ESOPHAGOGASTRODUODENOSCOPY (EGD) WITH PROPOFOL;  Surgeon: Lin Landsman, MD;  Location: [facility] ENDOSCOPY;  Service: Gastroenterology;  Laterality: N/A;  . EYE SURGERY    . GIVENS CAPSULE STUDY N/A 03/29/2020  Procedure: GIVENS CAPSULE STUDY;  Surgeon: Lin Landsman, MD;  Location: [facility]  ENDOSCOPY;  Service: Gastroenterology;  Laterality: N/A;  . HEMORRHOID BANDING  03/25/2018   Procedure: HEMORRHOID BANDING;  Surgeon: Lin Landsman, MD;  Location: [facility] ENDOSCOPY;  Service: Gastroenterology;;  . IR EMBO ART  VEN HEMORR LYMPH EXTRAV  INC GUIDE ROADMAPPING  08/07/2018  . IR RADIOLOGIST EVAL & MGMT  09/03/2018  . IR RADIOLOGIST EVAL & MGMT  12/17/2018  . IR RADIOLOGIST EVAL & MGMT  06/24/2019  . IR RADIOLOGIST EVAL & MGMT  01/22/2020  . IR TIPS  08/07/2018  . RADIOLOGY WITH ANESTHESIA N/A 08/07/2018   Procedure: TIPS;  Surgeon: Corrie Mckusick, DO;  Location: [place];  Service: Anesthesiology;  Laterality: N/A;  . TONSILLECTOMY      Prior to Admission medications   Medication Sig Start Date End Date Taking? Authorizing Provider  albuterol (VENTOLIN HFA) 108 (90 Base) MCG/ACT inhaler Inhale 2 puffs into the lungs every 4 (four) hours as needed.  05/06/20  Yes [provider]  Cholecalciferol (VITAMIN D3) 1.25 MG (50000 UT) CAPS Take 1 capsule by mouth once a week.  05/06/20  Yes [provider]  cyanocobalamin (,VITAMIN B-12,) 1000 MCG/ML injection Inject 1,000 mcg into the muscle every 30 (thirty) days.    Yes [provider]  furosemide (LASIX) 20 MG tablet Take 3 tablets (60 mg total) by mouth daily. Patient taking differently: Take 80 mg by mouth daily.  05/31/20  Yes Loel Dubonnet, NP  hydrOXYzine (ATARAX/VISTARIL) 10 MG tablet TAKE 1 TABLET (10 MG TOTAL) BY MOUTH 3 (THREE) TIMES DAILY AS NEEDED FOR ITCHING. 11/03/19  Yes Harold Moncus, Tally Due, MD  hydrOXYzine (ATARAX/VISTARIL) 25 MG tablet Take 25 mg by mouth 3 (three) times daily.  05/06/20  Yes [provider]  KLOR-CON M10 10 MEQ tablet Take 15 mEq by mouth daily. 07/14/20  Yes [provider]  lactulose (CHRONULAC) 10 GM/15ML solution Take 30 mLs (20 g total) by mouth 2 (two) times daily. 07/13/20 08/12/20 Yes Davis Ambrosini, Tally Due, MD  Misc. Devices KIT Moderate compression hose 20-30 MM  HG 10/14/19  Yes Byrne Capek, Tally Due, MD  nystatin (MYCOSTATIN/NYSTOP) powder Apply 1 g topically daily as needed (rash).  05/23/18  Yes [provider]  ondansetron (ZOFRAN) 4 MG tablet Take 4 mg by mouth every 8 (eight) hours as needed for nausea or vomiting.    Yes [provider]  OneTouch Delica Lancets 37S MISC 2 (two) times daily. 12/01/19  Yes [provider]  ONETOUCH ULTRA test strip 2 (two) times daily. 11/23/19  Yes [provider]  pantoprazole (PROTONIX) 40 MG tablet Take 40 mg by mouth 2 (two) times daily. 06/29/19  Yes [provider]  spironolactone (ALDACTONE) 100 MG tablet TAKE 1 TABLET BY MOUTH EVERY DAY Patient taking differently: Take 100 mg by mouth daily.  07/20/20  Yes Deriana Vanderhoef, Tally Due, MD  XIFAXAN 550 MG TABS tablet Take 550 mg by mouth 2 (two) times daily. 06/24/20  Yes [provider]    Allergies as of 07/13/2020  . (No Known Allergies)    Family History  Problem Relation Age of Onset  . CAD Father   . Heart attack Father 86  . Cancer Maternal Uncle   . Seizures Mother     Social History   Socioeconomic History  . Marital status: Single    Spouse name: Not on file  . Number of children: Not on file  . Years  of education: Not on file  . Highest education level: Not on file  Occupational History  . Not on file  Tobacco Use  . Smoking status: Never Smoker  . Smokeless tobacco: Never Used  Vaping Use  . Vaping Use: Never used  Substance and Sexual Activity  . Alcohol use: Not Currently    Comment: No EtOH for 30 years.  Never a heavy drinker.  . Drug use: Never  . Sexual activity: Not on file  Other Topics Concern  . Not on file  Social History Narrative   Independent at baseline. Lives at home with family   Social Determinants of Health   Financial Resource Strain:   . Difficulty of Paying Living Expenses: Not on file  Food Insecurity:   . Worried About Charity fundraiser in the Last Year:  Not on file  . Ran Out of Food in the Last Year: Not on file  Transportation Needs:   . Lack of Transportation (Medical): Not on file  . Lack of Transportation (Non-Medical): Not on file  Physical Activity:   . Days of Exercise per Week: Not on file  . Minutes of Exercise per Session: Not on file  Stress:   . Feeling of Stress : Not on file  Social Connections:   . Frequency of Communication with Friends and Family: Not on file  . Frequency of Social Gatherings with Friends and Family: Not on file  . Attends Religious Services: Not on file  . Active Member of Clubs or Organizations: Not on file  . Attends Archivist Meetings: Not on file  . Marital Status: Not on file  Intimate Partner Violence:   . Fear of Current or Ex-Partner: Not on file  . Emotionally Abused: Not on file  . Physically Abused: Not on file  . Sexually Abused: Not on file    Review of Systems: See HPI, otherwise negative ROS  Physical Exam: BP (!) 141/57   Pulse 62   Temp (!) 96.8 F (36 C) (Temporal)   Resp 18   Ht 5' 3" (1.6 m)   Wt 86.2 kg   LMP  (LMP Unknown)   SpO2 99%   BMI 33.66 kg/m  General:   Alert,  pleasant and cooperative in NAD Head:  Normocephalic and atraumatic. Neck:  Supple; no masses or thyromegaly. Lungs:  Clear throughout to auscultation.    Heart:  Regular rate and rhythm. Abdomen:  Soft, nontender and nondistended. Normal bowel sounds, without guarding, and without rebound.   Neurologic:  Alert and  oriented x4;  grossly normal neurologically.  Impression/Plan: Crystal [name] is here for a push enteroscopy to be performed for iron deficiency anemia  Risks, benefits, limitations, and alternatives regarding  Push enteroscopy have been reviewed with the patient.  Questions have been answered.  All parties agreeable.   Sherri Sear, MD  07/28/2020, 11:17 AM

## 2020-07-29 ENCOUNTER — Other Ambulatory Visit (INDEPENDENT_AMBULATORY_CARE_PROVIDER_SITE_OTHER): Payer: Self-pay | Admitting: Nurse Practitioner

## 2020-07-30 ENCOUNTER — Other Ambulatory Visit: Payer: Self-pay

## 2020-07-30 ENCOUNTER — Encounter
Admission: RE | Disposition: A | Discharge status: Home / Self Care | Payer: Self-pay | Source: Home / Self Care | Attending: Vascular Surgery

## 2020-07-30 ENCOUNTER — Encounter: Payer: Self-pay | Admitting: Vascular Surgery

## 2020-07-30 ENCOUNTER — Ambulatory Visit
Admission: RE | Admit: 2020-07-30 | Discharge: 2020-07-30 | Disposition: A | Discharge status: Home / Self Care | Payer: Medicare HMO | Attending: Vascular Surgery | Admitting: Vascular Surgery

## 2020-07-30 DIAGNOSIS — D638 Anemia in other chronic diseases classified elsewhere: Secondary | ICD-10-CM | POA: Diagnosis not present

## 2020-07-30 DIAGNOSIS — N83202 Unspecified ovarian cyst, left side: Secondary | ICD-10-CM | POA: Insufficient documentation

## 2020-07-30 DIAGNOSIS — R918 Other nonspecific abnormal finding of lung field: Secondary | ICD-10-CM | POA: Insufficient documentation

## 2020-07-30 DIAGNOSIS — Z79899 Other long term (current) drug therapy: Secondary | ICD-10-CM | POA: Diagnosis not present

## 2020-07-30 DIAGNOSIS — K7460 Unspecified cirrhosis of liver: Secondary | ICD-10-CM | POA: Diagnosis not present

## 2020-07-30 DIAGNOSIS — E538 Deficiency of other specified B group vitamins: Secondary | ICD-10-CM | POA: Insufficient documentation

## 2020-07-30 DIAGNOSIS — I8510 Secondary esophageal varices without bleeding: Secondary | ICD-10-CM | POA: Insufficient documentation

## 2020-07-30 DIAGNOSIS — E119 Type 2 diabetes mellitus without complications: Secondary | ICD-10-CM | POA: Insufficient documentation

## 2020-07-30 DIAGNOSIS — D649 Anemia, unspecified: Secondary | ICD-10-CM

## 2020-07-30 DIAGNOSIS — D509 Iron deficiency anemia, unspecified: Secondary | ICD-10-CM | POA: Diagnosis present

## 2020-07-30 DIAGNOSIS — I7300 Raynaud's syndrome without gangrene: Secondary | ICD-10-CM | POA: Diagnosis not present

## 2020-07-30 DIAGNOSIS — I509 Heart failure, unspecified: Secondary | ICD-10-CM | POA: Insufficient documentation

## 2020-07-30 HISTORY — PX: PORTA CATH INSERTION: CATH118285

## 2020-07-30 LAB — GLUCOSE, CAPILLARY
Glucose-Capillary: 68 mg/dL — ABNORMAL LOW (ref 70–99)
Glucose-Capillary: 21 mg/dL — CL (ref 70–99)
Glucose-Capillary: 26 mg/dL — CL (ref 70–99)
Glucose-Capillary: 231 mg/dL — ABNORMAL HIGH (ref 70–99)

## 2020-07-30 SURGERY — PORTA CATH INSERTION
Anesthesia: Moderate Sedation

## 2020-07-30 MED ORDER — ONDANSETRON HCL 4 MG/2ML IJ SOLN: 4.0000 mg | Freq: Four times a day (QID) | INTRAMUSCULAR | Status: DC | PRN

## 2020-07-30 MED ORDER — MIDAZOLAM HCL 2 MG/2ML IJ SOLN
INTRAMUSCULAR | Status: AC
  Filled 2020-07-30: qty 2

## 2020-07-30 MED ORDER — FENTANYL CITRATE (PF) 100 MCG/2ML IJ SOLN
INTRAMUSCULAR | Status: DC
Start: 2020-07-30 — End: 2020-07-30
  Filled 2020-07-30: qty 2

## 2020-07-30 MED ORDER — DIPHENHYDRAMINE HCL 50 MG/ML IJ SOLN: 50.0000 mg | Freq: Once | INTRAMUSCULAR | Status: DC | PRN

## 2020-07-30 MED ORDER — CEFAZOLIN SODIUM-DEXTROSE 2-4 GM/100ML-% IV SOLN: 2.0000 g | Freq: Once | INTRAVENOUS | Status: AC

## 2020-07-30 MED ORDER — DEXTROSE 50 % IV SOLN
INTRAVENOUS | Status: AC
  Filled 2020-07-30: qty 50

## 2020-07-30 MED ORDER — FENTANYL CITRATE (PF) 100 MCG/2ML IJ SOLN
INTRAMUSCULAR | Status: DC | PRN
Start: 2020-07-30 — End: 2020-07-30
  Administered 2020-07-30: 100 ug via INTRAVENOUS
  Administered 2020-07-30: 25 ug via INTRAVENOUS

## 2020-07-30 MED ORDER — HYDROMORPHONE HCL 1 MG/ML IJ SOLN: 1.0000 mg | Freq: Once | INTRAMUSCULAR | Status: DC | PRN

## 2020-07-30 MED ORDER — SODIUM CHLORIDE 0.9 % IV SOLN: INTRAVENOUS | Status: DC

## 2020-07-30 MED ORDER — GLUCOSE 40 % PO GEL
1.0000 | Freq: Once | ORAL | Status: AC
  Administered 2020-07-30: 37.5 g via ORAL
  Filled 2020-07-30: qty 1

## 2020-07-30 MED ORDER — DEXTROSE 50 % IV SOLN
INTRAVENOUS | Status: DC | PRN
  Administered 2020-07-30: 50 mL via INTRAVENOUS

## 2020-07-30 MED ORDER — MIDAZOLAM HCL 2 MG/ML PO SYRP: 8.0000 mg | ORAL_SOLUTION | Freq: Once | ORAL | Status: DC | PRN

## 2020-07-30 MED ORDER — METHYLPREDNISOLONE SODIUM SUCC 125 MG IJ SOLR: 125.0000 mg | Freq: Once | INTRAMUSCULAR | Status: DC | PRN

## 2020-07-30 MED ORDER — CEFAZOLIN SODIUM-DEXTROSE 2-4 GM/100ML-% IV SOLN
INTRAVENOUS | Status: AC
  Administered 2020-07-30: 2 g via INTRAVENOUS
  Filled 2020-07-30: qty 100

## 2020-07-30 MED ORDER — MIDAZOLAM HCL 2 MG/2ML IJ SOLN
INTRAMUSCULAR | Status: DC | PRN
  Administered 2020-07-30: 0.5 mg via INTRAVENOUS
  Administered 2020-07-30: 2 mg via INTRAVENOUS

## 2020-07-30 MED ORDER — FAMOTIDINE 20 MG PO TABS: 40.0000 mg | ORAL_TABLET | Freq: Once | ORAL | Status: DC | PRN

## 2020-07-30 MED ORDER — FENTANYL CITRATE (PF) 100 MCG/2ML IJ SOLN
INTRAMUSCULAR | Status: AC
  Filled 2020-07-30: qty 2

## 2020-07-30 SURGICAL SUPPLY — 10 items
DERMABOND ADVANCED (GAUZE/BANDAGES/DRESSINGS) ×1
DERMABOND ADVANCED .7 DNX12 (GAUZE/BANDAGES/DRESSINGS) ×1 IMPLANT
DRAPE INCISE IOBAN 66X45 STRL (DRAPES) ×2 IMPLANT
KIT PORT POWER 8FR ISP CVUE (Port) ×2 IMPLANT
NEEDLE ENTRY 21GA 7CM ECHOTIP (NEEDLE) ×4 IMPLANT
PACK ANGIOGRAPHY (CUSTOM PROCEDURE TRAY) ×2 IMPLANT
SET INTRO CAPELLA COAXIAL (SET/KITS/TRAYS/PACK) ×4 IMPLANT
SUT MNCRL AB 4-0 PS2 18 (SUTURE) ×2 IMPLANT
SUT VIC AB 3-0 CT1 27 (SUTURE) ×1
SUT VIC AB 3-0 CT1 TAPERPNT 27 (SUTURE) ×1 IMPLANT

## 2020-07-30 NOTE — Op Note (Signed)
OPERATIVE NOTE   PROCEDURE: 1. Placement of a right IJ Infuse-a-Port  PRE-OPERATIVE DIAGNOSIS: Anemia associated with cirrhosis  POST-OPERATIVE DIAGNOSIS: Same  SURGEON: Katha Cabal M.D.  ANESTHESIA: Conscious sedation was administered under my direct supervision by the interventional radiology RN. IV Versed plus fentanyl were utilized. Continuous ECG, pulse oximetry and blood pressure was monitored throughout the entire procedure. Conscious sedation was for a total of 36 minutes.  ESTIMATED BLOOD LOSS: Minimal   FINDING(S): 1.  Patent vein  SPECIMEN(S): None  INDICATIONS:   Crystal [name] is a 69 y.o. female who presents with anemia and cirrhosis decompensated.  She has no IV access and therefore requires appropriate parenteral access.  Infuse-a-Port has been recommended by her primary service.  Risk and benefits of been reviewed request all questions answered patient agrees to proceed.  DESCRIPTION: After obtaining full informed written consent, the patient was brought back to the special procedure suite and placed in the supine position. The patient's right neck and chest wall are prepped and draped in sterile fashion. Appropriate timeout was called.  Ultrasound is placed in a sterile sleeve, ultrasound is utilized to avoid vascular injury as well as secondary to lack of appropriate landmarks. The right internal jugular vein is identified. It is echolucent and homogeneous as well as easily compressible indicating patency. An image is recorded for the permanent record.  Access to the vein with a micropuncture needle is done under direct ultrasound visualization.  1% lidocaine is infiltrated into the soft tissue at the base of the neck as well as on the chest wall.  Under direct ultrasound visualization a micro-needle is inserted into the vein followed by the micro-wire. Micro-sheath was then advanced and a J wire is inserted without difficulty under fluoroscopic guidance. A small  counterincision was created at the wire insertion site. A transverse incision is created 2 fingerbreadths below the scapula and a pocket is fashioned using both blunt and sharp dissection. The pocket is tested for appropriate size with the hub of the Infuse-a-Port. The tunneling device is then used to pull the intravascular portion of the catheter from the pocket to the neck counterincision.  Dilator and peel-away sheath were then inserted over the wire and the wire is removed. Catheter is then advanced into the venous system without difficulty. Peel-away sheath was then removed.  Catheter is then positioned under fluoroscopic guidance at the atrial caval junction. It is then transected connected to the hub and the hope is slipped into the subcutaneous pocket on the chest wall. The hub was then accessed percutaneously and aspirates easily and flushes well and is flushed with 30 cc of heparinized saline. The pocket incision is then closed in layers using interrupted 3-0 Vicryl for the subcutaneous tissues and 4-0 Monocryl subcuticular for skin closure. Dermabond is applied. The neck counterincision was closed with 4-0 Monocryl subcuticular and Dermabond as well.  The patient tolerated the procedure well and there were no immediate complications.  COMPLICATIONS: None  CONDITION: Unchanged  Katha Cabal M.D. [facility] Office: [phone]   07/30/2020, 1:50 PM

## 2020-07-30 NOTE — Discharge Instructions (Signed)
Moderate Conscious Sedation, Adult, Care After These instructions provide you with information about caring for yourself after your procedure. Your health care provider may also give you more specific instructions. Your treatment has been planned according to current medical practices, but problems sometimes occur. Call your health care provider if you have any problems or questions after your procedure. What can I expect after the procedure? After your procedure, it is common:  To feel sleepy for several hours.  To feel clumsy and have poor balance for several hours.  To have poor judgment for several hours.  To vomit if you eat too soon. Follow these instructions at home: For at least 24 hours after the procedure:   Do not: ? Participate in activities where you could fall or become injured. ? Drive. ? Use heavy machinery. ? Drink alcohol. ? Take sleeping pills or medicines that cause drowsiness. ? Make important decisions or sign legal documents. ? Take care of children on your own.  Rest. Eating and drinking  Follow the diet recommended by your health care provider.  If you vomit: ? Drink water, juice, or soup when you can drink without vomiting. ? Make sure you have little or no nausea before eating solid foods. General instructions  Have a responsible adult stay with you until you are awake and alert.  Take over-the-counter and prescription medicines only as told by your health care provider.  If you smoke, do not smoke without supervision.  Keep all follow-up visits as told by your health care provider. This is important. Contact a health care provider if:  You keep feeling nauseous or you keep vomiting.  You feel light-headed.  You develop a rash.  You have a fever. Get help right away if:  You have trouble breathing. This information is not intended to replace advice given to you by your health care provider. Make sure you discuss any questions you have  with your health care provider. Document Revised: 10/12/2017 Document Reviewed: 02/19/2016 Elsevier Patient Education  [place] An implanted port is a device that is placed under the skin. It is usually placed in the chest. The device can be used to give IV medicine, to take blood, or for dialysis. You may have an implanted port if:  You need IV medicine that would be irritating to the small veins in your hands or arms.  You need IV medicines, such as antibiotics, for a long period of time.  You need IV nutrition for a long period of time.  You need dialysis. Having a port means that your health care provider will not need to use the veins in your arms for these procedures. You may have fewer limitations when using a port than you would if you used other types of long-term IVs, and you will likely be able to return to normal activities after your incision heals. An implanted port has two main parts:  Reservoir. The reservoir is the part where a needle is inserted to give medicines or draw blood. The reservoir is round. After it is placed, it appears as a small, raised area under your skin.  Catheter. The catheter is a thin, flexible tube that connects the reservoir to a vein. Medicine that is inserted into the reservoir goes into the catheter and then into the vein. How is my port accessed? To access your port:  A numbing cream may be placed on the skin over the port site.  Your health care provider  will put on a mask and sterile gloves.  The skin over your port will be cleaned carefully with a germ-killing soap and allowed to dry.  Your health care provider will gently pinch the port and insert a needle into it.  Your health care provider will check for a blood return to make sure the port is in the vein and is not clogged.  If your port needs to remain accessed to get medicine continuously (constant infusion), your health care provider will place  a clear bandage (dressing) over the needle site. The dressing and needle will need to be changed every week, or as told by your health care provider. What is flushing? Flushing helps keep the port from getting clogged. Follow instructions from your health care provider about how and when to flush the port. Ports are usually flushed with saline solution or a medicine called heparin. The need for flushing will depend on how the port is used:  If the port is only used from time to time to give medicines or draw blood, the port may need to be flushed: ? Before and after medicines have been given. ? Before and after blood has been drawn. ? As part of routine maintenance. Flushing may be recommended every 4-6 weeks.  If a constant infusion is running, the port may not need to be flushed.  Throw away any syringes in a disposal container that is meant for sharp items (sharps container). You can buy a sharps container from a pharmacy, or you can make one by using an empty hard plastic bottle with a cover. How long will my port stay implanted? The port can stay in for as long as your health care provider thinks it is needed. When it is time for the port to come out, a surgery will be done to remove it. The surgery will be similar to the procedure that was done to put the port in. Follow these instructions at home:   Flush your port as told by your health care provider.  If you need an infusion over several days, follow instructions from your health care provider about how to take care of your port site. Make sure you: ? Wash your hands with soap and water before you change your dressing. If soap and water are not available, use alcohol-based hand sanitizer. ? Change your dressing as told by your health care provider. ? Place any used dressings or infusion bags into a plastic bag. Throw that bag in the trash. ? Keep the dressing that covers the needle clean and dry. Do not get it wet. ? Do not use  scissors or sharp objects near the tube. ? Keep the tube clamped, unless it is being used.  Check your port site every day for signs of infection. Check for: ? Redness, swelling, or pain. ? Fluid or blood. ? Pus or a bad smell.  Protect the skin around the port site. ? Avoid wearing bra straps that rub or irritate the site. ? Protect the skin around your port from seat belts. Place a soft pad over your chest if needed.  Bathe or shower as told by your health care provider. The site may get wet as long as you are not actively receiving an infusion.  Return to your normal activities as told by your health care provider. Ask your health care provider what activities are safe for you.  Carry a medical alert card or wear a medical alert bracelet at all times. This will  let health care providers know that you have an implanted port in case of an emergency. Get help right away if:  You have redness, swelling, or pain at the port site.  You have fluid or blood coming from your port site.  You have pus or a bad smell coming from the port site.  You have a fever. Summary  Implanted ports are usually placed in the chest for long-term IV access.  Follow instructions from your health care provider about flushing the port and changing bandages (dressings).  Take care of the area around your port by avoiding clothing that puts pressure on the area, and by watching for signs of infection.  Protect the skin around your port from seat belts. Place a soft pad over your chest if needed.  Get help right away if you have a fever or you have redness, swelling, pain, drainage, or a bad smell at the port site. This information is not intended to replace advice given to you by your health care provider. Make sure you discuss any questions you have with your health care provider. Document Revised: 02/21/2019 Document Reviewed: 12/02/2016 Elsevier Patient Education  2020 [employer].

## 2020-07-30 NOTE — Interval H&P Note (Signed)
History and Physical Interval Note:  07/30/2020 1:08 PM  Crystal [name]  has presented today for surgery, with the diagnosis of Porta Cath Placement   Infusion for anemia Covid Sept 13.  The various methods of treatment have been discussed with the patient and family. After consideration of risks, benefits and other options for treatment, the patient has consented to  Procedure(s): PORTA CATH INSERTION (N/A) as a surgical intervention.  The patient's history has been reviewed, patient examined, no change in status, stable for surgery.  I have reviewed the patient's chart and labs.  Questions were answered to the patient's satisfaction.     Hortencia Pilar

## 2020-08-02 ENCOUNTER — Encounter: Payer: Self-pay | Admitting: Vascular Surgery

## 2020-08-04 NOTE — Progress Notes (Signed)
[facility]  [address] [place], [place] [address] Phone: [phone]  Fax: [phone]   Clinic Day:  08/05/2020  Referring physician: Lorelee Market, MD  Chief Complaint: Crystal [name] is a 69 y.o. female with decompensated cirrhosis s/p TIPS procedure, right sided pulmonary nodules who is seen for 1 month assessment.  HPI: The patient was last seen in the medical oncology clinic on 06/30/2020. At that time, she noted "today is a good day", but she did not have any energy. She was eating well.  Lower extremity swelling had improved since her diuretics were increased.  She denied any bleeding. Hematocrit was 24.5, hemoglobin 8.1, MCV 79, platelets 127,000, WBC 4,200. Ferritin was 23 with an iron saturation of 19% and a TIBC of 252. Reticulocyte count was 1.8%. Sed rate was 50.  She saw Dr. Holley Raring on 07/15/2020. Her chronic kidney disease appeared to be a bit worse.  Labs on 07/16/2020 included a creatinine of 1.95 (CrCl 26 ml/min).  Hematocrit was 27.5, hemoglobin 9.1, MCV 83, platelets 138,000, WBC 3600 with an ANC of 2300.  Given fluctuation in renal function, plan was to avoid ACE inhibitor and ARB for now.  She has a follow-up in 3 months.  Small bowel enteroscopy on 07/28/2020 by Dr. 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  The patient had a port placed on 07/30/2020 by Dr. Delana Meyer secondary to poor IV access.  She received a Vitamin B12 injection on 07/20/2020.  She received Venofer on 07/08/2020 and 07/21/2020.  During the interim, she has been okay. She has not had any energy lately. She had a fall about two weeks ago and bruised her tailbone. She is eating well. The painful knots on the backs of her legs have resolved.   Past Medical  History:  Diagnosis Date  . Acute GI bleeding 02/09/2018  . Acute upper gastrointestinal bleeding 08/03/2018  . Anemia    TAKES IRON TAB  . Arthritis    SHOULDER  . B12 deficiency 02/18/2018  . Bleeding    GI  3/19  . Bronchitis    HX OF  . CHF (congestive heart failure) ([place])   . Cirrhosis ([place])   . Colon cancer screening   . Diabetes mellitus without complication ([place])    TYPE 2  . Fatty (change of) liver, not elsewhere classified   . Full dentures    UPPER AND LOWER  . Iron deficiency anemia due to chronic blood loss 02/18/2018  . Lung nodule, multiple   . Pernicious anemia 02/22/2018  . PUD (peptic ulcer disease)   . Raynaud's syndrome   . Upper GI bleeding 08/03/2018    Past Surgical History:  Procedure Laterality Date  . CATARACT EXTRACTION W/PHACO Right 02/06/2018   Procedure: CATARACT EXTRACTION PHACO AND INTRAOCULAR LENS PLACEMENT ([place]) RIGHT DIABETIC;  Surgeon: Leandrew Koyanagi, MD;  Location: [place];  Service: Ophthalmology;  Laterality: Right;  DIABETIC, ORAL MED  . CATARACT EXTRACTION W/PHACO Left 04/30/2018   Procedure: CATARACT EXTRACTION PHACO AND INTRAOCULAR LENS PLACEMENT (IOC);  Surgeon: Leandrew Koyanagi, MD;  Location: [facility];  Service: Ophthalmology;  Laterality: Left;  [place] 01:04 AP% 21.3 CDE 13.66 Fluid pack lot # [id number]  . COLONOSCOPY WITH PROPOFOL N/A 03/25/2018   Procedure: COLONOSCOPY WITH PROPOFOL;  Surgeon: Lin Landsman, MD;  Location: [facility];  Service:  Gastroenterology;  Laterality: N/A;  . DENTAL SURGERY     EXTRACTIONS  . ELECTROMAGNETIC NAVIGATION BROCHOSCOPY Right 01/27/2019   Procedure: ELECTROMAGNETIC NAVIGATION BRONCHOSCOPY;  Surgeon: [name], MD;  Location: [facility];  Service: Cardiopulmonary;  Laterality: Right;  . ENTEROSCOPY N/A 07/28/2020   Procedure: Push ENTEROSCOPY;  Surgeon: Lin Landsman, MD;  Location: [facility]OSCOPY;  Service: Gastroenterology;  Laterality: N/A;  .  ESOPHAGOGASTRODUODENOSCOPY N/A 08/03/2018   Procedure: ESOPHAGOGASTRODUODENOSCOPY (EGD);  Surgeon: Lin Landsman, MD;  Location: [facility] ENDOSCOPY;  Service: Gastroenterology;  Laterality: N/A;  . ESOPHAGOGASTRODUODENOSCOPY (EGD) WITH PROPOFOL N/A 02/10/2018   Procedure: ESOPHAGOGASTRODUODENOSCOPY (EGD) WITH PROPOFOL;  Surgeon: Jonathon Bellows, MD;  Location: [facility] ENDOSCOPY;  Service: Gastroenterology;  Laterality: N/A;  . ESOPHAGOGASTRODUODENOSCOPY (EGD) WITH PROPOFOL N/A 03/25/2018   Procedure: ESOPHAGOGASTRODUODENOSCOPY (EGD) WITH PROPOFOL;  Surgeon: Lin Landsman, MD;  Location: [facility] ENDOSCOPY;  Service: Gastroenterology;  Laterality: N/A;  . ESOPHAGOGASTRODUODENOSCOPY (EGD) WITH PROPOFOL N/A 04/22/2018   Procedure: ESOPHAGOGASTRODUODENOSCOPY (EGD) WITH PROPOFOL with band ligation;  Surgeon: Lin Landsman, MD;  Location: [place];  Service: Gastroenterology;  Laterality: N/A;  . ESOPHAGOGASTRODUODENOSCOPY (EGD) WITH PROPOFOL N/A 06/24/2018   Procedure: ESOPHAGOGASTRODUODENOSCOPY (EGD) WITH PROPOFOL;  Surgeon: Lin Landsman, MD;  Location: [facility] ENDOSCOPY;  Service: Gastroenterology;  Laterality: N/A;  . ESOPHAGOGASTRODUODENOSCOPY (EGD) WITH PROPOFOL N/A 11/19/2018   Procedure: ESOPHAGOGASTRODUODENOSCOPY (EGD) WITH PROPOFOL;  Surgeon: Lin Landsman, MD;  Location: [facility] ENDOSCOPY;  Service: Gastroenterology;  Laterality: N/A;  . ESOPHAGOGASTRODUODENOSCOPY (EGD) WITH PROPOFOL N/A 03/08/2020   Procedure: ESOPHAGOGASTRODUODENOSCOPY (EGD) WITH PROPOFOL;  Surgeon: Lin Landsman, MD;  Location: [facility] ENDOSCOPY;  Service: Gastroenterology;  Laterality: N/A;  . EYE SURGERY    . GIVENS CAPSULE STUDY N/A 03/29/2020   Procedure: GIVENS CAPSULE STUDY;  Surgeon: Lin Landsman, MD;  Location: [facility] ENDOSCOPY;  Service: Gastroenterology;  Laterality: N/A;  . HEMORRHOID BANDING  03/25/2018   Procedure: HEMORRHOID BANDING;  Surgeon: Lin Landsman, MD;  Location: [facility] ENDOSCOPY;   Service: Gastroenterology;;  . IR EMBO ART  VEN HEMORR LYMPH EXTRAV  INC GUIDE ROADMAPPING  08/07/2018  . IR RADIOLOGIST EVAL & MGMT  09/03/2018  . IR RADIOLOGIST EVAL & MGMT  12/17/2018  . IR RADIOLOGIST EVAL & MGMT  06/24/2019  . IR RADIOLOGIST EVAL & MGMT  01/22/2020  . IR TIPS  08/07/2018  . PORTA CATH INSERTION N/A 07/30/2020   Procedure: PORTA CATH INSERTION;  Surgeon: Katha Cabal, MD;  Location: [place] CV LAB;  Service: Cardiovascular;  Laterality: N/A;  . RADIOLOGY WITH ANESTHESIA N/A 08/07/2018   Procedure: TIPS;  Surgeon: Corrie Mckusick, DO;  Location: [place];  Service: Anesthesiology;  Laterality: N/A;  . TONSILLECTOMY      Family History  Problem Relation Age of Onset  . CAD Father   . Heart attack Father 53  . Cancer Maternal Uncle   . Seizures Mother     Social History:  reports that she has never smoked. She has never used smokeless tobacco. She reports previous alcohol use. She reports that she does not use drugs. She retired from [employer] in 11/2017. She lives in [place]. The patient is alone today.  Allergies: No Known Allergies  Current Medications: Current Outpatient Medications  Medication Sig Dispense Refill  . albuterol (VENTOLIN HFA) 108 (90 Base) MCG/ACT inhaler Inhale 2 puffs into the lungs every 4 (four) hours as needed.     . Cholecalciferol (VITAMIN D3) 1.25 MG (50000 UT) CAPS Take 1 capsule by mouth once  a week.     . cyanocobalamin (,VITAMIN B-12,) 1000 MCG/ML injection Inject 1,000 mcg into the muscle every 30 (thirty) days.     . furosemide (LASIX) 20 MG tablet Take 3 tablets (60 mg total) by mouth daily. (Patient taking differently: Take 80 mg by mouth daily. ) 90 tablet 2  . hydrOXYzine (ATARAX/VISTARIL) 10 MG tablet TAKE 1 TABLET (10 MG TOTAL) BY MOUTH 3 (THREE) TIMES DAILY AS NEEDED FOR ITCHING. 30 tablet 0  . KLOR-CON M10 10 MEQ tablet Take 15 mEq by mouth daily.    Marland Kitchen lactulose (CHRONULAC) 10 GM/15ML solution Take 30 mLs (20 g total)  by mouth 2 (two) times daily. 1892 mL 3  . Misc. Devices KIT Moderate compression hose 20-30 MM HG 1 kit 0  . nystatin (MYCOSTATIN/NYSTOP) powder Apply 1 g topically daily as needed (rash).     . ondansetron (ZOFRAN) 4 MG tablet Take 4 mg by mouth every 8 (eight) hours as needed for nausea or vomiting.     Glory Rosebush Delica Lancets 70W MISC 2 (two) times daily.    Glory Rosebush ULTRA test strip 2 (two) times daily.    . pantoprazole (PROTONIX) 40 MG tablet Take 40 mg by mouth 2 (two) times daily.    Marland Kitchen spironolactone (ALDACTONE) 100 MG tablet TAKE 1 TABLET BY MOUTH EVERY DAY (Patient taking differently: Take 100 mg by mouth daily. ) 30 tablet 0  . XIFAXAN 550 MG TABS tablet Take 550 mg by mouth 2 (two) times daily.     No current facility-administered medications for this visit.    Review of Systems  Constitutional: Positive for malaise/fatigue and weight loss (3 lbs). Negative for chills, diaphoresis and fever.  HENT: Negative for congestion, ear discharge, ear pain, hearing loss, nosebleeds, sinus pain, sore throat and tinnitus.   Eyes: Negative for blurred vision and double vision.  Respiratory: Negative for cough, hemoptysis, sputum production and shortness of breath.   Cardiovascular: Negative for chest pain, palpitations and leg swelling.  Gastrointestinal: Negative for abdominal pain, blood in stool, constipation, diarrhea, heartburn, melena, nausea and vomiting.       Eating well.  Genitourinary: Negative for dysuria, frequency, hematuria and urgency.  Musculoskeletal: Positive for falls (2 weeks ago, bruised tailbone). Negative for back pain, joint pain, myalgias and neck pain.  Skin: Negative for itching and rash.       Raynaud's.  Neurological: Negative for dizziness, tingling, sensory change, weakness and headaches.  Endo/Heme/Allergies: Does not bruise/bleed easily.       Diabetes.  Psychiatric/Behavioral: Negative for depression and memory loss. The patient is not  nervous/anxious and does not have insomnia.   All other systems reviewed and are negative.  Performance status (ECOG): 2  Vitals Blood pressure (!) 136/58, pulse 69, temperature 97.8 F (36.6 C), resp. rate 20, weight 186 lb 6.4 oz (84.5 kg).   Physical Exam Vitals and nursing note reviewed.  Constitutional:      General: She is not in acute distress.    Appearance: Normal appearance. She is well-developed. She is not diaphoretic.     Interventions: Face mask in place.  HENT:     Head: Normocephalic and atraumatic.     Comments: Short white hair.    Mouth/Throat:     Mouth: Mucous membranes are moist. No oral lesions.     Pharynx: Oropharynx is clear.  Eyes:     General: No scleral icterus.    Extraocular Movements: Extraocular movements intact.  Conjunctiva/sclera: Conjunctivae normal.     Pupils: Pupils are equal, round, and reactive to light.     Comments: Glasses. Blue eyes.   Neck:     Vascular: No JVD.  Cardiovascular:     Rate and Rhythm: Normal rate and regular rhythm.     Heart sounds: Normal heart sounds. No murmur heard.  No friction rub. No gallop.   Pulmonary:     Effort: Pulmonary effort is normal.     Breath sounds: Normal breath sounds. No wheezing, rhonchi or rales.  Abdominal:     General: Bowel sounds are normal. There is no distension.     Palpations: Abdomen is soft. There is no mass.     Tenderness: There is no abdominal tenderness. There is no guarding or rebound.  Musculoskeletal:        General: No tenderness. Normal range of motion.     Cervical back: Normal range of motion and neck supple.     Comments: Chronic dense legs.  Lymphadenopathy:     Head:     Right side of head: No preauricular, posterior auricular or occipital adenopathy.     Left side of head: No preauricular, posterior auricular or occipital adenopathy.     Cervical: No cervical adenopathy.     Upper Body:     Right upper body: No supraclavicular or axillary adenopathy.      Left upper body: No supraclavicular or axillary adenopathy.     Lower Body: No right inguinal adenopathy. No left inguinal adenopathy.  Skin:    General: Skin is warm and dry.     Coloration: Skin is not pale.     Findings: No erythema.     Comments: Port on right side of chest.  Neurological:     Mental Status: She is alert and oriented to person, place, and time.  Psychiatric:        Behavior: Behavior normal.        Thought Content: Thought content normal.        Judgment: Judgment normal.    No visits with results within 3 Day(s) from this visit.  Latest known visit with results is:  Admission on 07/30/2020, Discharged on 07/30/2020  Component Date Value Ref Range Status  . Glucose-Capillary 07/30/2020 68* 70 - 99 mg/dL Final   Glucose reference range applies only to samples taken after fasting for at least 8 hours.  . Glucose-Capillary 07/30/2020 21* 70 - 99 mg/dL Final   Glucose reference range applies only to samples taken after fasting for at least 8 hours.  . Comment 1 07/30/2020 Repeat Test   Final  . Glucose-Capillary 07/30/2020 26* 70 - 99 mg/dL Final   Glucose reference range applies only to samples taken after fasting for at least 8 hours.  . Glucose-Capillary 07/30/2020 231* 70 - 99 mg/dL Final   Glucose reference range applies only to samples taken after fasting for at least 8 hours.    Assessment:  Crystal [name] is a 69 y.o. female with a right lower lobe pulmonary nodule. She denies any smoking history.  Abdomen and pelvic CTon 02/09/2018 revealed an 1.8 cm spiculated density in right lower lobe concerning for malignancy. There was mildly nodular hepatic contourconcerning for hepatic cirrhosis. There was moderate splenomegalysuggesting portal venous hypertension. Mild ascites was noted. There were mildly enlarged retroperitoneal lymph nodes (largest 1.1 cm) concerning for possible metastatic disease or malignancy. There was a 7 cm left ovarian cyst.  Further evaluation with MRI was recommended  to evaluate for possible neoplasm. CA125was 351.3 and CEA1.1 on 02/21/2018.   Chest CTon 02/25/2018 revealed a 2.3 x 1.5 x 1.0 cm perifissural nodulewith spiculated margins in the right middle lobe. There was a 1.5 x 1.5 x 1.4 cm right lower lobe spiculated nodule. There was a moderate left sided pleural effusion.   PET scanon 03/08/2018 revealed a 2.5 x 1.8 cm sub solid nodular opacity in theRML(SUV 2.7) and a 1.6 cm roundedslightly spiculated RLL pulmonary nodule(SUV 2.1). There were no enlarged or hypermetabolic mediastinal or hilar lymph nodes. There was a small left pleural effusion. 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  Chest CTon 05/15/2018 revealed no significant changes in the 2 previously noted nodules in the RIGHT lung. Irregular RML lesion measures 2.4 x 1.4 cm (previously 2.3 x 1.5 cm). Spiculated central RLL lesion measures 1.8 x 1.4 cm (previously 1.6 x 1.5 cm). Both areas remain concerning for neoplasm. There was interval decrease in ascites volume overall. Upper abdominal lymphadenopathy noted that was felt to be reactive due to underlying liver disease.   Chest CTon 08/15/2018 revealed the spiculated right lower lobe lung lesion appeared slightly increased and was not significantly changed in size compared with previous exam. Morphologically, this was worrisome for a small bronchogenic neoplasm. The right middle lobe lung nodule was slightly decreased in size in the interval and may be post infectious or inflammatory in etiology.  Super D chest CTon 01/24/2019 revealed a 3.2 x 2.3 cm ill-defined anterior right lung nodule compared to 2.9 x 2.3 cm previously. Minor fissurewasnot well, and as such, this nodule can not be confidently localized to the right upper or middle lobe. The right lower lobe nodule measured2.0 x 2.0 cm  comparedto 2.0 x 1.9 cm.  Chest CTon 08/07/2019 revealed no significant interval change in size right middle lobe and right lower lobe lung nodules.Liverwascompatible with cirrhosiss/pTIPS.Chest CTon 02/02/2020 revealed stable nodules within the right middle and right lower lobes.Therewerecirrhotic changesof the liver with TIPS shunt in place. There was mild prominence of the ascending aorta to 4 cm. Recommendedannual imaging followup by CTA or MRA.  She was [facility] from 02/09/2018 - 04/02/2019with a variceal hemorrhage. EGDon 02/10/2018 revealed 5 columns of grade III esophageal varices in the lower third of the esophagus. There was stigmata of recent bleeding and red wale signs. She underwent variceal ligation x 10. She required 3 units of PRBCs.  EGDon 03/25/2018 revealed 2 angiectasias(non-bleeding) in the second portion of the duodenum, and a few diminutive (non-bleeding) angiectasias in the prepyloric region of the stomach that were treated APC. There were 4 non-bleeding ulcers ([place] Class III) found in the gastric fundus. There were 4 columns of large non-bleeding varicesin the lower third of the esophagus, of which demonstrated no stigmata of bleeding. Varices were banded. EGD on 04/22/2018 revealed one non-bleeding cratered gastric ulcer([place]rest Class III) in the lesser curvature of the gastric body. Duodenal bulb and second portion of the duodenum were normal. Moderate portal hypertensive gastropathy noted in the stomach. Large varices in the lower third of the esophagus noted. 2 bands were placed with incomplete eradication of the lesions. EGD on 03/08/2020 revealed a few non-bleeding angioectasias in the duodenum and a gastric antral vascular ectasia with bleeding treated with argon plasma coagulation (APC).  There was a single non-bleeding angioectasia in the stomach treated with bipolar cautery. Gastroesophageal junction and esophagus were normal. No  specimens were collected.  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  Colonoscopyon 03/25/2018 revealed a single 5 mm sessile polyp in the ascending colon. Patient has rectal varices and external hemorrhoids. Pathology returned as tubular adenoma, and was negative for high grade dysplasia and malignancy.   She has iron deficiency anemia. Ferritinwas 6. She is on ferrous sulfate 325 mg TID. She hasB12 deficiency. B12was 103. Intrinsic factor antibody was negative. 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 She began B12 injections on 03/06/2018 (last09/05/2020). Folate was12.3on12/14/2020. B12 was 485 on 05/24/2018. Dietis good.  She received Venoferweekly x 4 (07/12/2018 - 07/31/2018), x 3 (12/22/2019 -01/05/2020), x 3 (03/30/2020 - 04/13/2020), 07/08/2020, and 07/21/2020.  Ferritinhas been followed: 6 on 02/09/2018, 16 on 05/24/2018, 13 on 07/10/2018, 374 on 08/15/2018, 485 on 08/21/2018, 114 on 09/25/2018, 63 on 11/20/2018, 35 on 02/12/2019, 22 on 04/24/2019, 41 on 09/22/2019, 41 on 10/27/2019, 21 on 12/15/2019, 22 on 03/22/2020, 20 on 03/26/2020, 278 on 04/14/2020, 57 on 05/24/2020, and 23 on 06/30/2020.  She has hepatitis C antibodies. HCV RNA was not detected. She has been exposed to hepatitis B. Anti-smooth muscle antibodiesare weakly positive. ANAwas positive (>1:1280 centromere antibody). Negative studiesincluded: ceruloplasmin, anti-mitochondrial antibodies, hepatitis B IgM, HIV, and alpha-1 antitrypsin. PT was 15.2 (INR 1.21). AFPwas 1.5 on 05/28/2018,2.4 on 11/20/2018, and 2.0 on 04/24/2019.  She has a history of decompensated cirrhosiss/p TIPSprocedure 08/07/2018. She has portal  hypertension and a history of recurrent bleeding esophageal varices.She was given a 2-year life expectancyfollowing her TIPS procedure. Abdominal/pelvic arterial/venous ultrasound doppleron 04/24/2019 revealed a patent TIPS and no evidence of persisting ascites.  Abdominal MRI including MRCPon 11/12/2019 revealed cirrhosis with TIPS in place.There was no biliary duct dilatation.There was cholelithiasis without acute cholecystitis. Right hepatic lobe 9 mm lesion whichwas indeterminate, LR 3.This was possibly present on the prior CT of 2019. Recommendation includedrepeat pre and post contrast abdominal MRI at 3-6 months. There as small volume abdominal ascites.The right lower lobe pulmonary nodulewas suboptimally evaluated.  [facility] with[facility].HeadCT revealedno acute intracranial pathology. Head MRIwo contrast showed symmetric FLAIR hyperintensity within the bilateral mamillary bodies. This finding may be seen in the setting of Wernicke encephalopathy.There was no evidence of acute infarct and mild chronic small vessels with ischemic disease.Her mental status returned to baseline.  RUQ ultrasoundon 05/29/2018 revealed cholelithiasis. There was a thickened edematous gallbladder wall. The wall thickening could be due to hypoproteinemia/hypoalbuminemia or cholecystitis. Negative sonographic Murphy's sign. There was a heterogeneous slightly nodular liver consistent with cirrhosis.   Renal ultrasound on 06/15/2020 revealed marked splenomegaly (13.9 x 15.2 x 5.6 cm; 618 ml) similar to comparison study of 05/28/2020 and MR of the abdomen on 11/12/2019.  Pelvic ultrasoundon 03/18/2018 demonstrated a simple cyst in the LEFT ovarymeasuring 6.1 x 4.5 x 6.6 cm. Repeat imaging recommended in 1 year to document stability.  Patient declined follow-up.  The patient received the [place] COVID-19 vaccine on 12/26/2019 and  01/21/2020.  Symptomatically, she has has limited energy.  She fell 2 weeks ago.  Plan: 1.   Labs today:  CBC, ferritin. 2.Decompensated cirrhosis s/p TIPS Clinically,she doing fair. Life expectancy is limited s/pTIPS on 08/07/2018 (2 years). She denies any bleeding. AFPwas 2.0on06/09/2019. Continue liver imaging every 6 months for [place] surveillance (last 05/28/2020).   Patient is followed closely by GI. 3.Pulmonary nodules Chest CTon 02/02/2020 revealed  stable nodules within the right middle and right lower lobes.  Consider chest CT in 01/2021. 4.Iron deficiency anemia Hematocrit24.5. Hemoglobin8.1. MCV79on08/18/2021.  Ferritin23 with an iron saturation of 19% and a TIBC of 252.  Sed rate is 50.  Retic is 1.8% (low).  She received Venofer x2. Hematocrit 26.8.  Hemoglobin 8.6.  MCV 82 on 08/05/2020.    Ferritin 26. Venofer today. 5.B12 deficiency Patient receives B12 monthly (last09/05/2020) Continue B12 monthly. 6.   Venofer today. 7.   RTC monthly x 6 for B12. 8.   RTC on 09/14/2020 for MD assessment, labs (CBC with diff, ferritin, iron studies, AFP), and B12.  I discussed the assessment and treatment plan with the patient.  The patient was provided an opportunity to ask questions and all were answered.  The patient agreed with the plan and demonstrated an understanding of the instructions.  The patient was advised to call back if the symptoms worsen or if the condition fails to improve as anticipated.  I provided 12 minutes of face-to-face time during this this encounter and > 50% was spent counseling as documented under my assessment and plan.  An additional 10 minutes were spent reviewing her chart (Epic and Care Everywhere) including notes, labs, and imaging studies.    Lequita Asal, MD, PhD    08/05/2020, 2:46 PM  I, Mirian Mo Tufford, am acting as Education administrator for [employer]. Mike Gip, MD, PhD.  I, Ramiz Turpin C.  Mike Gip, MD, have reviewed the above documentation for accuracy and completeness, and I agree with the above.

## 2020-08-05 ENCOUNTER — Other Ambulatory Visit: Payer: Self-pay

## 2020-08-05 ENCOUNTER — Ambulatory Visit: Payer: Self-pay

## 2020-08-05 ENCOUNTER — Encounter: Payer: Self-pay | Admitting: Hematology and Oncology

## 2020-08-05 ENCOUNTER — Inpatient Hospital Stay: Payer: Medicare HMO

## 2020-08-05 ENCOUNTER — Inpatient Hospital Stay (HOSPITAL_BASED_OUTPATIENT_CLINIC_OR_DEPARTMENT_OTHER): Payer: Medicare HMO | Admitting: Hematology and Oncology

## 2020-08-05 VITALS — BP 126/61 | HR 68 | Resp 18

## 2020-08-05 VITALS — BP 136/58 | HR 69 | Temp 97.8°F | Resp 20 | Wt 186.4 lb

## 2020-08-05 DIAGNOSIS — R918 Other nonspecific abnormal finding of lung field: Secondary | ICD-10-CM

## 2020-08-05 DIAGNOSIS — K7469 Other cirrhosis of liver: Secondary | ICD-10-CM | POA: Diagnosis not present

## 2020-08-05 DIAGNOSIS — D500 Iron deficiency anemia secondary to blood loss (chronic): Secondary | ICD-10-CM | POA: Diagnosis not present

## 2020-08-05 DIAGNOSIS — D509 Iron deficiency anemia, unspecified: Secondary | ICD-10-CM | POA: Diagnosis not present

## 2020-08-05 DIAGNOSIS — E538 Deficiency of other specified B group vitamins: Secondary | ICD-10-CM

## 2020-08-05 LAB — CBC
HCT: 26.8 % — ABNORMAL LOW (ref 36.0–46.0)
Hemoglobin: 8.6 g/dL — ABNORMAL LOW (ref 12.0–15.0)
MCH: 26.3 pg (ref 26.0–34.0)
MCHC: 32.1 g/dL (ref 30.0–36.0)
MCV: 82 fL (ref 80.0–100.0)
Platelets: 151 10*3/uL (ref 150–400)
RBC: 3.27 MIL/uL — ABNORMAL LOW (ref 3.87–5.11)
RDW: 17.2 % — ABNORMAL HIGH (ref 11.5–15.5)
WBC: 4.6 10*3/uL (ref 4.0–10.5)
nRBC: 0 % (ref 0.0–0.2)

## 2020-08-05 LAB — FERRITIN: Ferritin: 26 ng/mL (ref 11–307)

## 2020-08-05 MED ORDER — IRON SUCROSE 20 MG/ML IV SOLN
200.0000 mg | Freq: Once | INTRAVENOUS | Status: AC
  Administered 2020-08-05: 200 mg via INTRAVENOUS
  Filled 2020-08-05: qty 10

## 2020-08-05 MED ORDER — SODIUM CHLORIDE 0.9 % IV SOLN
Freq: Once | INTRAVENOUS | Status: AC
  Filled 2020-08-05: qty 250

## 2020-08-05 MED ORDER — SODIUM CHLORIDE 0.9% FLUSH
10.0000 mL | Freq: Once | INTRAVENOUS | Status: AC | PRN
  Administered 2020-08-05: 10 mL
  Filled 2020-08-05: qty 10

## 2020-08-05 MED ORDER — HEPARIN SOD (PORK) LOCK FLUSH 100 UNIT/ML IV SOLN
500.0000 [IU] | Freq: Once | INTRAVENOUS | Status: AC | PRN
  Administered 2020-08-05: 500 [IU]
  Filled 2020-08-05: qty 5

## 2020-08-05 NOTE — Progress Notes (Signed)
Patient states she fell 2 weeks ago. She hit her head and back. She is complaining of pain in tail bone.

## 2020-08-12 ENCOUNTER — Inpatient Hospital Stay: Payer: Medicare HMO

## 2020-08-12 ENCOUNTER — Other Ambulatory Visit: Payer: Self-pay

## 2020-08-12 VITALS — BP 127/73 | HR 64 | Temp 97.0°F | Resp 16

## 2020-08-12 DIAGNOSIS — D509 Iron deficiency anemia, unspecified: Secondary | ICD-10-CM | POA: Diagnosis not present

## 2020-08-12 DIAGNOSIS — E538 Deficiency of other specified B group vitamins: Secondary | ICD-10-CM | POA: Diagnosis not present

## 2020-08-12 MED ORDER — HEPARIN SOD (PORK) LOCK FLUSH 100 UNIT/ML IV SOLN
INTRAVENOUS | Status: AC
  Filled 2020-08-12: qty 5

## 2020-08-12 MED ORDER — HEPARIN SOD (PORK) LOCK FLUSH 100 UNIT/ML IV SOLN
500.0000 [IU] | Freq: Once | INTRAVENOUS | Status: AC | PRN
  Administered 2020-08-12: 500 [IU]
  Filled 2020-08-12: qty 5

## 2020-08-12 MED ORDER — IRON SUCROSE 20 MG/ML IV SOLN
200.0000 mg | Freq: Once | INTRAVENOUS | Status: AC
  Administered 2020-08-12: 200 mg via INTRAVENOUS
  Filled 2020-08-12: qty 10

## 2020-08-12 MED ORDER — SODIUM CHLORIDE 0.9% FLUSH
10.0000 mL | Freq: Once | INTRAVENOUS | Status: AC | PRN
  Administered 2020-08-12: 10 mL
  Filled 2020-08-12: qty 10

## 2020-08-17 ENCOUNTER — Inpatient Hospital Stay: Payer: Medicare HMO

## 2020-08-19 ENCOUNTER — Inpatient Hospital Stay: Payer: Medicare HMO

## 2020-08-19 ENCOUNTER — Inpatient Hospital Stay: Payer: Medicare HMO | Attending: Hematology and Oncology

## 2020-08-19 ENCOUNTER — Other Ambulatory Visit: Payer: Self-pay

## 2020-08-19 DIAGNOSIS — E538 Deficiency of other specified B group vitamins: Secondary | ICD-10-CM

## 2020-08-19 DIAGNOSIS — D500 Iron deficiency anemia secondary to blood loss (chronic): Secondary | ICD-10-CM

## 2020-08-19 DIAGNOSIS — D509 Iron deficiency anemia, unspecified: Secondary | ICD-10-CM | POA: Insufficient documentation

## 2020-08-19 MED ORDER — HEPARIN SOD (PORK) LOCK FLUSH 100 UNIT/ML IV SOLN
500.0000 [IU] | Freq: Once | INTRAVENOUS | Status: AC
  Administered 2020-08-19: 500 [IU] via INTRAVENOUS
  Filled 2020-08-19: qty 5

## 2020-08-19 MED ORDER — CYANOCOBALAMIN 1000 MCG/ML IJ SOLN
1000.0000 ug | Freq: Once | INTRAMUSCULAR | Status: AC
  Administered 2020-08-19: 1000 ug via INTRAMUSCULAR
  Filled 2020-08-19: qty 1

## 2020-08-19 MED ORDER — SODIUM CHLORIDE 0.9 % IV SOLN
INTRAVENOUS | Status: DC
  Filled 2020-08-19: qty 250

## 2020-08-19 MED ORDER — IRON SUCROSE 20 MG/ML IV SOLN
200.0000 mg | Freq: Once | INTRAVENOUS | Status: AC
  Administered 2020-08-19: 200 mg via INTRAVENOUS
  Filled 2020-08-19: qty 10

## 2020-08-26 ENCOUNTER — Other Ambulatory Visit: Payer: Self-pay | Admitting: Family

## 2020-08-26 DIAGNOSIS — I5032 Chronic diastolic (congestive) heart failure: Secondary | ICD-10-CM

## 2020-08-27 ENCOUNTER — Other Ambulatory Visit: Payer: Self-pay | Admitting: Family

## 2020-08-27 DIAGNOSIS — I5032 Chronic diastolic (congestive) heart failure: Secondary | ICD-10-CM

## 2020-08-30 DIAGNOSIS — R69 Illness, unspecified: Secondary | ICD-10-CM | POA: Diagnosis not present

## 2020-08-31 ENCOUNTER — Other Ambulatory Visit: Payer: Self-pay | Admitting: Family

## 2020-08-31 DIAGNOSIS — I5032 Chronic diastolic (congestive) heart failure: Secondary | ICD-10-CM

## 2020-09-04 DIAGNOSIS — R69 Illness, unspecified: Secondary | ICD-10-CM | POA: Diagnosis not present

## 2020-09-13 NOTE — Progress Notes (Signed)
[facility]  [address], [address] [place], [place] [address] Phone: [phone]  Fax: [phone]   Clinic Day:  09/14/2020  Referring physician: Lorelee Market, MD  Chief Complaint: Crystal [name] is a 69 y.o. female with decompensated cirrhosis s/p TIPS procedure, right sided pulmonary nodules who is seen for 1 month assessment.  HPI: The patient was last seen in the medical oncology clinic on 08/05/2020. At that time, she had limited energy.  She had fallen 2 weeks prior. Hematocrit was 26.8, hemoglobin 8.6, MCV 82.0, platelets 151,000, WBC 4,600. Ferritin was 26.   She received Venofer weekly x 3 (08/05/2020 - 08/19/2020).  She received a vitamin B12 injection on 08/19/2020.  During the interim, she has been "ok".  She is still fatigued. She is eating well, but has lost 7 lbs. She notes that she has lost some fluid weight in her legs. The patient gets nauseous occasionally and takes medicine prn. She has been taking lactulose TID and has diarrhea. She denies recent falls, shortness of breath, chest pain, bruising, or bleeding. She was taken off of her diabetes medications.  She is due for vitamin B12 and wants a flu shot. She is very happy about her new port-a-cath.   She sees her GI physician every 3 months. Her next appointment is in 10/2020.   Past Medical History:  Diagnosis Date  . Acute GI bleeding 02/09/2018  . Acute upper gastrointestinal bleeding 08/03/2018  . Anemia    TAKES IRON TAB  . Arthritis    SHOULDER  . B12 deficiency 02/18/2018  . Bleeding    GI  3/19  . Bronchitis    HX OF  . CHF (congestive heart failure) ([place])   . Cirrhosis ([place])   . Colon cancer screening   . Diabetes mellitus without complication ([place] )    TYPE 2  . Fatty (change of) liver, not elsewhere classified   . Full dentures    UPPER AND LOWER  . Iron deficiency anemia due to chronic blood loss 02/18/2018  . Lung nodule, multiple   . Pernicious anemia 02/22/2018   . PUD (peptic ulcer disease)   . Raynaud's syndrome   . Upper GI bleeding 08/03/2018    Past Surgical History:  Procedure Laterality Date  . CATARACT EXTRACTION W/PHACO Right 02/06/2018   Procedure: CATARACT EXTRACTION PHACO AND INTRAOCULAR LENS PLACEMENT ([place]) RIGHT DIABETIC;  Surgeon: Leandrew Koyanagi, MD;  Location: [place];  Service: Ophthalmology;  Laterality: Right;  DIABETIC, ORAL MED  . CATARACT EXTRACTION W/PHACO Left 04/30/2018   Procedure: CATARACT EXTRACTION PHACO AND INTRAOCULAR LENS PLACEMENT (I[facility]);  Surgeon: Leandrew Koyanagi, MD;  Location: [facility] ORS;  Service: Ophthalmology;  Laterality: Left;  [place] 01:04 AP% 21.3 CDE 13.66 Fluid pack lot # [id number]  . COLONOSCOPY WITH PROPOFOL N/A 03/25/2018   Procedure: COLONOSCOPY WITH PROPOFOL;  Surgeon: Lin Landsman, MD;  Location: [facility] ENDOSCOPY;  Service: Gastroenterology;  Laterality: N/A;  . DENTAL SURGERY     EXTRACTIONS  . ELECTROMAGNETIC NAVIGATION BROCHOSCOPY Right 01/27/2019   Procedure: ELECTROMAGNETIC NAVIGATION BRONCHOSCOPY;  Surgeon: [name], MD;  Location: [facility];  Service: Cardiopulmonary;  Laterality: Right;  . ENTEROSCOPY N/A 07/28/2020   Procedure: Push ENTEROSCOPY;  Surgeon: Lin Landsman, MD;  Location: [facility] ENDOSCOPY;  Service: Gastroenterology;  Laterality: N/A;  . ESOPHAGOGASTRODUODENOSCOPY N/A 08/03/2018   Procedure: ESOPHAGOGASTRODUODENOSCOPY (EGD);  Surgeon: Lin Landsman, MD;  Location: [facility] ENDOSCOPY;  Service: Gastroenterology;  Laterality: N/A;  . ESOPHAGOGASTRODUODENOSCOPY (EGD) WITH PROPOFOL N/A  02/10/2018   Procedure: ESOPHAGOGASTRODUODENOSCOPY (EGD) WITH PROPOFOL;  Surgeon: Jonathon Bellows, MD;  Location: [facility] ENDOSCOPY;  Service: Gastroenterology;  Laterality: N/A;  . ESOPHAGOGASTRODUODENOSCOPY (EGD) WITH PROPOFOL N/A 03/25/2018   Procedure: ESOPHAGOGASTRODUODENOSCOPY (EGD) WITH PROPOFOL;  Surgeon: Lin Landsman, MD;  Location: [facility] ENDOSCOPY;  Service:  Gastroenterology;  Laterality: N/A;  . ESOPHAGOGASTRODUODENOSCOPY (EGD) WITH PROPOFOL N/A 04/22/2018   Procedure: ESOPHAGOGASTRODUODENOSCOPY (EGD) WITH PROPOFOL with band ligation;  Surgeon: Lin Landsman, MD;  Location: [place];  Service: Gastroenterology;  Laterality: N/A;  . ESOPHAGOGASTRODUODENOSCOPY (EGD) WITH PROPOFOL N/A 06/24/2018   Procedure: ESOPHAGOGASTRODUODENOSCOPY (EGD) WITH PROPOFOL;  Surgeon: Lin Landsman, MD;  Location: [facility] ENDOSCOPY;  Service: Gastroenterology;  Laterality: N/A;  . ESOPHAGOGASTRODUODENOSCOPY (EGD) WITH PROPOFOL N/A 11/19/2018   Procedure: ESOPHAGOGASTRODUODENOSCOPY (EGD) WITH PROPOFOL;  Surgeon: Lin Landsman, MD;  Location: [facility] ENDOSCOPY;  Service: Gastroenterology;  Laterality: N/A;  . ESOPHAGOGASTRODUODENOSCOPY (EGD) WITH PROPOFOL N/A 03/08/2020   Procedure: ESOPHAGOGASTRODUODENOSCOPY (EGD) WITH PROPOFOL;  Surgeon: Lin Landsman, MD;  Location: [facility] ENDOSCOPY;  Service: Gastroenterology;  Laterality: N/A;  . EYE SURGERY    . GIVENS CAPSULE STUDY N/A 03/29/2020   Procedure: GIVENS CAPSULE STUDY;  Surgeon: Lin Landsman, MD;  Location: [facility] ENDOSCOPY;  Service: Gastroenterology;  Laterality: N/A;  . HEMORRHOID BANDING  03/25/2018   Procedure: HEMORRHOID BANDING;  Surgeon: Lin Landsman, MD;  Location: [facility] ENDOSCOPY;  Service: Gastroenterology;;  . IR EMBO ART  VEN HEMORR LYMPH EXTRAV  INC GUIDE ROADMAPPING  08/07/2018  . IR RADIOLOGIST EVAL & MGMT  09/03/2018  . IR RADIOLOGIST EVAL & MGMT  12/17/2018  . IR RADIOLOGIST EVAL & MGMT  06/24/2019  . IR RADIOLOGIST EVAL & MGMT  01/22/2020  . IR TIPS  08/07/2018  . PORTA CATH INSERTION N/A 07/30/2020   Procedure: PORTA CATH INSERTION;  Surgeon: Katha Cabal, MD;  Location: [place] CV LAB;  Service: Cardiovascular;  Laterality: N/A;  . RADIOLOGY WITH ANESTHESIA N/A 08/07/2018   Procedure: TIPS;  Surgeon: Corrie Mckusick, DO;  Location: [place];  Service: Anesthesiology;   Laterality: N/A;  . TONSILLECTOMY      Family History  Problem Relation Age of Onset  . CAD Father   . Heart attack Father 35  . Cancer Maternal Uncle   . Seizures Mother     Social History:  reports that she has never smoked. She has never used smokeless tobacco. She reports previous alcohol use. She reports that she does not use drugs. She retired from [employer] in 11/2017. She lives in [place]. The patient is alone today.  Allergies: No Known Allergies  Current Medications: Current Outpatient Medications  Medication Sig Dispense Refill  . albuterol (VENTOLIN HFA) 108 (90 Base) MCG/ACT inhaler Inhale 2 puffs into the lungs every 4 (four) hours as needed.     . Cholecalciferol (VITAMIN D3) 1.25 MG (50000 UT) CAPS Take 1 capsule by mouth once a week.     . cyanocobalamin (,VITAMIN B-12,) 1000 MCG/ML injection Inject 1,000 mcg into the muscle every 30 (thirty) days.     . furosemide (LASIX) 20 MG tablet TAKE 3 TABLETS BY MOUTH EVERY DAY 90 tablet 1  . hydrOXYzine (ATARAX/VISTARIL) 10 MG tablet TAKE 1 TABLET (10 MG TOTAL) BY MOUTH 3 (THREE) TIMES DAILY AS NEEDED FOR ITCHING. 30 tablet 0  . KLOR-CON M10 10 MEQ tablet TAKE 10 MG (1 TABLET DAILY) DAILY . ALTERNATING DAYS TAKE 15 MG (1 AND 1/2 TABLET) DAILY 38 tablet 0  . lactulose (  CHRONULAC) 10 GM/15ML solution Take by mouth.    . Misc. Devices KIT Moderate compression hose 20-30 MM HG 1 kit 0  . ondansetron (ZOFRAN) 4 MG tablet Take 4 mg by mouth every 8 (eight) hours as needed for nausea or vomiting.     Glory Rosebush Delica Lancets 95G MISC 2 (two) times daily.    Glory Rosebush ULTRA test strip 2 (two) times daily.    . pantoprazole (PROTONIX) 40 MG tablet Take 40 mg by mouth 2 (two) times daily.    Marland Kitchen spironolactone (ALDACTONE) 100 MG tablet TAKE 1 TABLET BY MOUTH EVERY DAY (Patient taking differently: Take 100 mg by mouth daily. ) 30 tablet 0  . Vitamin D, Ergocalciferol, (DRISDOL) 1.25 MG (50000 UNIT) CAPS capsule Take 50,000 Units by  mouth once a week.    Marland Kitchen XIFAXAN 550 MG TABS tablet Take 550 mg by mouth 2 (two) times daily.    . hydrOXYzine (ATARAX/VISTARIL) 25 MG tablet Take 25 mg by mouth 3 (three) times daily. (Patient not taking: Reported on 09/14/2020)    . nystatin (MYCOSTATIN/NYSTOP) powder Apply 1 g topically daily as needed (rash).  (Patient not taking: Reported on 09/14/2020)    . potassium chloride (KLOR-CON) 10 MEQ tablet Take 10 mEq by mouth daily. (Patient not taking: Reported on 09/14/2020)     No current facility-administered medications for this visit.   Facility-Administered Medications Ordered in Other Visits  Medication Dose Route Frequency Provider Last Rate Last Admin  . heparin lock flush 100 unit/mL  500 Units Intravenous Once Ammon Muscatello C, MD      . sodium chloride flush (NS) 0.9 % injection 10 mL  10 mL Intravenous PRN Lequita Asal, MD        Review of Systems  Constitutional: Positive for malaise/fatigue and weight loss (7 lbs). Negative for chills, diaphoresis and fever.       Feels "okay."  HENT: Negative for congestion, ear discharge, ear pain, hearing loss, nosebleeds, sinus pain, sore throat and tinnitus.   Eyes: Negative for blurred vision and double vision.  Respiratory: Negative for cough, hemoptysis, sputum production and shortness of breath.   Cardiovascular: Negative for chest pain, palpitations and leg swelling.  Gastrointestinal: Positive for diarrhea and nausea (occasional). Negative for abdominal pain, blood in stool, constipation, heartburn, melena and vomiting.       Eating well.  Genitourinary: Negative for dysuria, frequency, hematuria and urgency.  Musculoskeletal: Negative for back pain, joint pain, myalgias and neck pain.  Skin: Negative for itching and rash.       Raynaud's.  Neurological: Negative for dizziness, tingling, sensory change, weakness and headaches.  Endo/Heme/Allergies: Does not bruise/bleed easily.       Diabetes.  Psychiatric/Behavioral:  Negative for depression and memory loss. The patient is not nervous/anxious and does not have insomnia.   All other systems reviewed and are negative.  Performance status (ECOG): 1  Vitals Blood pressure (!) 118/51, pulse 62, temperature (!) 97.2 F (36.2 C), temperature source Tympanic, resp. rate 18, weight 179 lb 14.4 oz (81.6 kg).   Physical Exam Vitals and nursing note reviewed.  Constitutional:      General: She is not in acute distress.    Appearance: Normal appearance. She is well-developed. She is not diaphoretic.     Interventions: Face mask in place.  HENT:     Head: Normocephalic and atraumatic.     Comments: Short white hair.    Mouth/Throat:     Mouth: Mucous membranes  are moist. No oral lesions.     Pharynx: Oropharynx is clear.  Eyes:     General: No scleral icterus.    Extraocular Movements: Extraocular movements intact.     Conjunctiva/sclera: Conjunctivae normal.     Pupils: Pupils are equal, round, and reactive to light.     Comments: Glasses. Blue eyes.   Neck:     Vascular: No JVD.  Cardiovascular:     Rate and Rhythm: Normal rate and regular rhythm.     Heart sounds: Normal heart sounds. No murmur heard.  No friction rub. No gallop.   Pulmonary:     Effort: Pulmonary effort is normal.     Breath sounds: Normal breath sounds. No wheezing, rhonchi or rales.  Abdominal:     General: Bowel sounds are normal. There is no distension.     Palpations: Abdomen is soft. There is no mass.     Tenderness: There is no abdominal tenderness. There is no guarding or rebound.  Musculoskeletal:        General: No tenderness. Normal range of motion.     Cervical back: Normal range of motion and neck supple.     Comments: Chronic dense legs.  Lymphadenopathy:     Head:     Right side of head: No preauricular, posterior auricular or occipital adenopathy.     Left side of head: No preauricular, posterior auricular or occipital adenopathy.     Cervical: No cervical  adenopathy.     Upper Body:     Right upper body: No supraclavicular or axillary adenopathy.     Left upper body: No supraclavicular or axillary adenopathy.     Lower Body: No right inguinal adenopathy. No left inguinal adenopathy.  Skin:    General: Skin is warm and dry.     Coloration: Skin is not pale.     Findings: No erythema.     Comments: Port on right side of chest. Cool blue fingertips secondary to Raynaud's (old). Patient denies finger pain.  Neurological:     Mental Status: She is alert and oriented to person, place, and time.  Psychiatric:        Behavior: Behavior normal.        Thought Content: Thought content normal.        Judgment: Judgment normal.    No visits with results within 3 Day(s) from this visit.  Latest known visit with results is:  Infusion on 08/05/2020  Component Date Value Ref Range Status  . Ferritin 08/05/2020 26  11 - 307 ng/mL Final   Performed at [facility], [address] [place], [place] [address]  . WBC 08/05/2020 4.6  4.0 - 10.5 K/uL Final  . RBC 08/05/2020 3.27* 3.87 - 5.11 MIL/uL Final  . Hemoglobin 08/05/2020 8.6* 12.0 - 15.0 g/dL Final  . HCT 08/05/2020 26.8* 36 - 46 % Final  . MCV 08/05/2020 82.0  80.0 - 100.0 fL Final  . MCH 08/05/2020 26.3  26.0 - 34.0 pg Final  . MCHC 08/05/2020 32.1  30.0 - 36.0 g/dL Final  . RDW 08/05/2020 17.2* 11.5 - 15.5 % Final  . Platelets 08/05/2020 151  150 - 400 K/uL Final  . nRBC 08/05/2020 0.0  0.0 - 0.2 % Final   Performed at [facility], [address] [place], [place] [address]    Assessment:  Crystal [name] is a 69 y.o. female with a right lower lobe pulmonary nodule. She denies any smoking history.  Abdomen and pelvic CTon 02/09/2018 revealed an 1.8 cm spiculated density in right lower lobe concerning for malignancy. There was mildly nodular hepatic contourconcerning for hepatic cirrhosis. There was moderate splenomegalysuggesting portal venous hypertension. Mild  ascites was noted. There were mildly enlarged retroperitoneal lymph nodes (largest 1.1 cm) concerning for possible metastatic disease or malignancy. There was a 7 cm left ovarian cyst. Further evaluation with MRI was recommended to evaluate for possible neoplasm. CA125was 351.3 and CEA1.1 on 02/21/2018.   Chest CTon 02/25/2018 revealed a 2.3 x 1.5 x 1.0 cm perifissural nodulewith spiculated margins in the right middle lobe. There was a 1.5 x 1.5 x 1.4 cm right lower lobe spiculated nodule. There was a moderate left sided pleural effusion.   PET scanon 03/08/2018 revealed a 2.5 x 1.8 cm sub solid nodular opacity in theRML(SUV 2.7) and a 1.6 cm roundedslightly spiculated RLL pulmonary nodule(SUV 2.1). There were no enlarged or hypermetabolic mediastinal or hilar lymph nodes. There was a small left pleural effusion. 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  Chest CTon 05/15/2018 revealed no significant changes in the 2 previously noted nodules in the RIGHT lung. Irregular RML lesion measures 2.4 x 1.4 cm (previously 2.3 x 1.5 cm). Spiculated central RLL lesion measures 1.8 x 1.4 cm (previously 1.6 x 1.5 cm). Both areas remain concerning for neoplasm. There was interval decrease in ascites volume overall. Upper abdominal lymphadenopathy noted that was felt to be reactive due to underlying liver disease.   Chest CTon 08/15/2018 revealed the spiculated right lower lobe lung lesion appeared slightly increased and was not significantly changed in size compared with previous exam. Morphologically, this was worrisome for a small bronchogenic neoplasm. The right middle lobe lung nodule was slightly decreased in size in the interval and may be post infectious or inflammatory in etiology.  Super D chest CTon 01/24/2019 revealed a 3.2 x 2.3 cm ill-defined anterior right lung nodule compared to 2.9 x 2.3  cm previously. Minor fissurewasnot well, and as such, this nodule can not be confidently localized to the right upper or middle lobe. The right lower lobe nodule measured2.0 x 2.0 cm comparedto 2.0 x 1.9 cm.  Chest CTon 08/07/2019 revealed no significant interval change in size right middle lobe and right lower lobe lung nodules.Liverwascompatible with cirrhosiss/pTIPS.Chest CTon 02/02/2020 revealed stable nodules within the right middle and right lower lobes.Therewerecirrhotic changesof the liver with TIPS shunt in place. There was mild prominence of the ascending aorta to 4 cm. Recommendedannual imaging followup by CTA or MRA.  She was admitted to [facility] from 02/09/2018 - 04/02/2019with a variceal hemorrhage. EGDon 02/10/2018 revealed 5 columns of grade III esophageal varices in the lower third of the esophagus. There was stigmata of recent bleeding and red wale signs. She underwent variceal ligation x 10. She required 3 units of PRBCs.  EGDon 03/25/2018 revealed 2 angiectasias(non-bleeding) in the second portion of the duodenum, and a few diminutive (non-bleeding) angiectasias in the prepyloric region of the stomach that were treated APC. There were 4 non-bleeding ulcers ([place] Class III) found in the gastric fundus. There were 4 columns of large non-bleeding varicesin the lower third of the esophagus, of which demonstrated no stigmata of bleeding. Varices were banded. EGD on 04/22/2018 revealed one non-bleeding cratered gastric ulcer([place]rest Class III) in the lesser curvature of the gastric body. Duodenal bulb and second portion of the duodenum were normal. Moderate portal hypertensive gastropathy noted in the stomach. Large varices in the lower  third of the esophagus noted. 2 bands were placed with incomplete eradication of the lesions. EGD on 03/08/2020 revealed a few non-bleeding angioectasias in the duodenum and a gastric antral vascular ectasia with bleeding  treated with argon plasma coagulation (APC).  There was a single non-bleeding angioectasia in the stomach treated with bipolar cautery. Gastroesophageal junction and esophagus were normal. No specimens were collected.  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  Colonoscopyon 03/25/2018 revealed a single 5 mm sessile polyp in the ascending colon. Patient has rectal varices and external hemorrhoids. Pathology returned as tubular adenoma, and was negative for high grade dysplasia and malignancy.   She has iron deficiency anemia. Ferritinwas 6. She is on ferrous sulfate 325 mg TID. She hasB12 deficiency. B12was 103. Intrinsic factor antibody was negative. 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 She began B12 injections on 03/06/2018 (last10/05/2020). Folate was19.2on06/06/2020. Dietis good.  She received Venoferweekly x 4 (07/12/2018 - 07/31/2018), x 3 (12/22/2019 -01/05/2020), x 3 (03/30/2020 - 04/13/2020), 07/08/2020, 07/21/2020, and x 3 (08/05/2020 - 08/19/2020).  Ferritinhas been followed: 6 on 02/09/2018, 16 on 05/24/2018, 13 on 07/10/2018, 374 on 08/15/2018, 485 on 08/21/2018, 114 on 09/25/2018, 63 on 11/20/2018, 35 on 02/12/2019, 22 on 04/24/2019, 41 on 09/22/2019, 41 on 10/27/2019, 21 on 12/15/2019, 22 on 03/22/2020, 20 on 03/26/2020, 271 on 04/14/2020, 57 on 05/24/2020, 23 on 06/30/2020, and 26 on 08/05/2020.  She has hepatitis C antibodies. HCV RNA was not detected. She has been exposed to hepatitis B. Anti-smooth muscle antibodiesare weakly positive. ANAwas positive (>1:1280 centromere antibody). Negative studiesincluded: ceruloplasmin, anti-mitochondrial antibodies, hepatitis B IgM, HIV, and alpha-1  antitrypsin. PT was 15.2 (INR 1.21). AFPwas 1.5 on 05/28/2018,2.4 on 11/20/2018, 2.0 on 04/24/2019, and 1.8 on 09/14/2020.  She has a history of decompensated cirrhosiss/p TIPSprocedure 08/07/2018. She has portal hypertension and a history of recurrent bleeding esophageal varices.She was given a 2-year life expectancyfollowing her TIPS procedure. Abdominal/pelvic arterial/venous ultrasound doppleron 04/24/2019 revealed a patent TIPS and no evidence of persisting ascites.  Abdominal MRI including MRCPon 11/12/2019 revealed cirrhosis with TIPS in place.There was no biliary duct dilatation.There was cholelithiasis without acute cholecystitis. Right hepatic lobe 9 mm lesion whichwas indeterminate, LR 3.This was possibly present on the prior CT of 2019. Recommendation includedrepeat pre and post contrast abdominal MRI at 3-6 months. There as small volume abdominal ascites.The right lower lobe pulmonary nodulewas suboptimally evaluated.  [facility] with[facility].HeadCT revealedno acute intracranial pathology. Head MRIwo contrast showed symmetric FLAIR hyperintensity within the bilateral mamillary bodies. This finding may be seen in the setting of Wernicke encephalopathy.There was no evidence of acute infarct and mild chronic small vessels with ischemic disease.Her mental status returned to baseline.  RUQ ultrasoundon 05/29/2018 revealed cholelithiasis. There was a thickened edematous gallbladder wall. The wall thickening could be due to hypoproteinemia/hypoalbuminemia or cholecystitis. Negative sonographic Murphy's sign. There was a heterogeneous slightly nodular liver consistent with cirrhosis.   Renal ultrasound on 06/15/2020 revealed marked splenomegaly (13.9 x 15.2 x 5.6 cm; 618 ml) similar to comparison study of 05/28/2020 and MR of the abdomen on 11/12/2019.  Pelvic ultrasoundon 03/18/2018 demonstrated a simple  cyst in the LEFT ovarymeasuring 6.1 x 4.5 x 6.6 cm. Repeat imaging recommended in 1 year to document stability.  Patient declined follow-up.  The patient received the [employer]  COVID-19 vaccine on 12/26/2019 and 01/21/2020.  Symptomatically, she feels fatigued. She is eating well, but has lost 7 lbs (? fluid weight).  She denies any bleeding.  Plan: 1.   Labs today:  CBC with diff, ferritin, iron studies, AFP 2.Decompensated cirrhosis s/p TIPS Clinically,she continues to do fair. Life expectancy is limited s/pTIPS on 08/07/2018 (2 years). She denies any bleeding or pain AFPis 1.8 today. Discuss liver imaging every 6 months for [facility] surveillance (last 05/28/2020).   Patient has an appointment with GI in 10/2020. 3.Pulmonary nodules Chest CTon 02/02/2020 revealed stable nodules within the right middle and right lower lobes.  Plan to discuss chest CT in 01/2021. 4.Iron deficiency anemia Hematocrit24.5. Hemoglobin8.1. MCV79on08/18/2021.  Ferritin23 with an iron saturation of 19% and a TIBC of 252.  Sed rate is 50.  Retic is 1.8% (low).  She received Venofer x2. Hematocrit 26.8.  Hemoglobin 8.6.  MCV 82.0 on 08/05/2020.    Ferritin 26.  Hematocrit 29.1.  Hemoglobin 9.4.  MCV 82.9 on 09/14/2020.  Ferritin 74 with an iron saturation of 59% and a TIBC of 151. No Venofer today. 5.B12 deficiency Patient receives B12 monthly (last10/05/2020) B12 today and monthly x 6. 6.   Influenza vaccine today. 7.   RN:  Loma Sousa to call patient with ferritin. 8.   RTC in 2 months for labs (CBC, ferritin, iron studies) and B12. 9.   RTC in 4 months for MD assessment, labs (CBC with diff, ferritin, iron studies-day before) and +/- Venofer.  I discussed the assessment and treatment plan with the patient.  The patient was provided an opportunity to ask questions and all were answered.  The patient agreed with the plan and demonstrated an understanding  of the instructions.  The patient was advised to call back if the symptoms worsen or if the condition fails to improve as anticipated.   Lequita Asal, MD, PhD    09/14/2020, 1:56 PM  I, Mirian Mo Tufford, am acting as Education administrator for [employer]. Mike Gip, MD, PhD.  I, Sativa Gelles C. Mike Gip, MD, have reviewed the above documentation for accuracy and completeness, and I agree with the above.

## 2020-09-14 ENCOUNTER — Encounter: Payer: Self-pay | Admitting: Hematology and Oncology

## 2020-09-14 ENCOUNTER — Inpatient Hospital Stay: Payer: Medicare HMO | Attending: Hematology and Oncology

## 2020-09-14 ENCOUNTER — Inpatient Hospital Stay: Payer: Medicare HMO

## 2020-09-14 ENCOUNTER — Other Ambulatory Visit: Payer: Self-pay

## 2020-09-14 ENCOUNTER — Inpatient Hospital Stay (HOSPITAL_BASED_OUTPATIENT_CLINIC_OR_DEPARTMENT_OTHER): Payer: Medicare HMO | Admitting: Hematology and Oncology

## 2020-09-14 VITALS — BP 118/51 | HR 62 | Temp 97.2°F | Resp 18 | Wt 179.9 lb

## 2020-09-14 DIAGNOSIS — Z23 Encounter for immunization: Secondary | ICD-10-CM | POA: Diagnosis not present

## 2020-09-14 DIAGNOSIS — Z79899 Other long term (current) drug therapy: Secondary | ICD-10-CM | POA: Diagnosis not present

## 2020-09-14 DIAGNOSIS — K7460 Unspecified cirrhosis of liver: Secondary | ICD-10-CM | POA: Diagnosis not present

## 2020-09-14 DIAGNOSIS — R918 Other nonspecific abnormal finding of lung field: Secondary | ICD-10-CM

## 2020-09-14 DIAGNOSIS — D500 Iron deficiency anemia secondary to blood loss (chronic): Secondary | ICD-10-CM

## 2020-09-14 DIAGNOSIS — E538 Deficiency of other specified B group vitamins: Secondary | ICD-10-CM

## 2020-09-14 DIAGNOSIS — K31819 Angiodysplasia of stomach and duodenum without bleeding: Secondary | ICD-10-CM

## 2020-09-14 DIAGNOSIS — D509 Iron deficiency anemia, unspecified: Secondary | ICD-10-CM | POA: Insufficient documentation

## 2020-09-14 DIAGNOSIS — Z95828 Presence of other vascular implants and grafts: Secondary | ICD-10-CM | POA: Diagnosis not present

## 2020-09-14 DIAGNOSIS — R188 Other ascites: Secondary | ICD-10-CM | POA: Insufficient documentation

## 2020-09-14 DIAGNOSIS — K7469 Other cirrhosis of liver: Secondary | ICD-10-CM

## 2020-09-14 DIAGNOSIS — E119 Type 2 diabetes mellitus without complications: Secondary | ICD-10-CM | POA: Diagnosis not present

## 2020-09-14 DIAGNOSIS — I509 Heart failure, unspecified: Secondary | ICD-10-CM | POA: Diagnosis not present

## 2020-09-14 DIAGNOSIS — R5383 Other fatigue: Secondary | ICD-10-CM | POA: Insufficient documentation

## 2020-09-14 LAB — CBC WITH DIFFERENTIAL/PLATELET
Abs Immature Granulocytes: 0.02 10*3/uL (ref 0.00–0.07)
Basophils Absolute: 0 10*3/uL (ref 0.0–0.1)
Basophils Relative: 1 %
Eosinophils Absolute: 0.1 10*3/uL (ref 0.0–0.5)
Eosinophils Relative: 2 %
HCT: 29.1 % — ABNORMAL LOW (ref 36.0–46.0)
Hemoglobin: 9.4 g/dL — ABNORMAL LOW (ref 12.0–15.0)
Immature Granulocytes: 1 %
Lymphocytes Relative: 20 %
Lymphs Abs: 0.7 10*3/uL (ref 0.7–4.0)
MCH: 26.8 pg (ref 26.0–34.0)
MCHC: 32.3 g/dL (ref 30.0–36.0)
MCV: 82.9 fL (ref 80.0–100.0)
Monocytes Absolute: 0.4 10*3/uL (ref 0.1–1.0)
Monocytes Relative: 10 %
Neutro Abs: 2.5 10*3/uL (ref 1.7–7.7)
Neutrophils Relative %: 66 %
Platelets: 120 10*3/uL — ABNORMAL LOW (ref 150–400)
RBC: 3.51 MIL/uL — ABNORMAL LOW (ref 3.87–5.11)
RDW: 16.7 % — ABNORMAL HIGH (ref 11.5–15.5)
WBC: 3.6 10*3/uL — ABNORMAL LOW (ref 4.0–10.5)
nRBC: 0 % (ref 0.0–0.2)

## 2020-09-14 LAB — FERRITIN: Ferritin: 74 ng/mL (ref 11–307)

## 2020-09-14 LAB — IRON AND TIBC
Iron: 89 ug/dL (ref 28–170)
Saturation Ratios: 59 % — ABNORMAL HIGH (ref 10.4–31.8)
TIBC: 151 ug/dL — ABNORMAL LOW (ref 250–450)
UIBC: 62 ug/dL

## 2020-09-14 MED ORDER — HEPARIN SOD (PORK) LOCK FLUSH 100 UNIT/ML IV SOLN
500.0000 [IU] | Freq: Once | INTRAVENOUS | Status: AC
  Administered 2020-09-14: 500 [IU] via INTRAVENOUS
  Filled 2020-09-14: qty 5

## 2020-09-14 MED ORDER — INFLUENZA VAC A&B SA ADJ QUAD 0.5 ML IM PRSY
0.5000 mL | PREFILLED_SYRINGE | Freq: Once | INTRAMUSCULAR | Status: AC
  Administered 2020-09-14: 0.5 mL via INTRAMUSCULAR
  Filled 2020-09-14: qty 0.5

## 2020-09-14 MED ORDER — SODIUM CHLORIDE 0.9% FLUSH
10.0000 mL | INTRAVENOUS | Status: AC | PRN
  Administered 2020-09-14: 10 mL via INTRAVENOUS
  Filled 2020-09-14: qty 10

## 2020-09-14 MED ORDER — CYANOCOBALAMIN 1000 MCG/ML IJ SOLN
1000.0000 ug | Freq: Once | INTRAMUSCULAR | Status: AC
  Administered 2020-09-14: 1000 ug via INTRAMUSCULAR
  Filled 2020-09-14: qty 1

## 2020-09-14 NOTE — Progress Notes (Signed)
Patient wants a flu shot today. Patient reports strong odor in her urine.

## 2020-09-15 LAB — AFP TUMOR MARKER: AFP, Serum, Tumor Marker: 1.8 ng/mL (ref 0.0–8.3)

## 2020-09-21 ENCOUNTER — Other Ambulatory Visit: Payer: Self-pay | Admitting: Family

## 2020-09-21 DIAGNOSIS — I5032 Chronic diastolic (congestive) heart failure: Secondary | ICD-10-CM

## 2020-09-24 DIAGNOSIS — H26493 Other secondary cataract, bilateral: Secondary | ICD-10-CM | POA: Diagnosis not present

## 2020-10-04 ENCOUNTER — Other Ambulatory Visit: Payer: Self-pay

## 2020-10-04 ENCOUNTER — Encounter: Payer: Self-pay | Admitting: Family

## 2020-10-04 ENCOUNTER — Ambulatory Visit (INDEPENDENT_AMBULATORY_CARE_PROVIDER_SITE_OTHER): Payer: Medicaid Other | Admitting: Family

## 2020-10-04 VITALS — BP 118/64 | HR 68 | Ht 63.0 in | Wt 190.0 lb

## 2020-10-04 DIAGNOSIS — E119 Type 2 diabetes mellitus without complications: Secondary | ICD-10-CM | POA: Diagnosis not present

## 2020-10-04 DIAGNOSIS — I10 Essential (primary) hypertension: Secondary | ICD-10-CM | POA: Diagnosis not present

## 2020-10-04 DIAGNOSIS — E538 Deficiency of other specified B group vitamins: Secondary | ICD-10-CM | POA: Diagnosis not present

## 2020-10-04 DIAGNOSIS — K7460 Unspecified cirrhosis of liver: Secondary | ICD-10-CM | POA: Diagnosis not present

## 2020-10-04 DIAGNOSIS — I2584 Coronary atherosclerosis due to calcified coronary lesion: Secondary | ICD-10-CM | POA: Diagnosis not present

## 2020-10-04 DIAGNOSIS — J449 Chronic obstructive pulmonary disease, unspecified: Secondary | ICD-10-CM | POA: Diagnosis not present

## 2020-10-04 DIAGNOSIS — I700 Atherosclerosis of aorta: Secondary | ICD-10-CM

## 2020-10-04 DIAGNOSIS — I5032 Chronic diastolic (congestive) heart failure: Secondary | ICD-10-CM

## 2020-10-04 DIAGNOSIS — E669 Obesity, unspecified: Secondary | ICD-10-CM | POA: Diagnosis not present

## 2020-10-04 DIAGNOSIS — E782 Mixed hyperlipidemia: Secondary | ICD-10-CM | POA: Diagnosis not present

## 2020-10-04 DIAGNOSIS — I2510 Atherosclerotic heart disease of native coronary artery without angina pectoris: Secondary | ICD-10-CM | POA: Diagnosis not present

## 2020-10-04 DIAGNOSIS — D500 Iron deficiency anemia secondary to blood loss (chronic): Secondary | ICD-10-CM | POA: Diagnosis not present

## 2020-10-04 NOTE — Patient Instructions (Addendum)
Medication Instructions:  No medication changes today.   *If you need a refill on your cardiac medications before your next appointment, please call your pharmacy*  Lab Work: None ordered today.  Testing/Procedures: None ordered today.   Follow-Up: At [facility], you and your health needs are our priority.  As part of our continuing mission to provide you with exceptional heart care, we have created designated Provider Care Teams.  These Care Teams include your primary Cardiologist (physician) and Advanced Practice Providers (APPs -  Physician Assistants and Nurse Practitioners) who all work together to provide you with the care you need, when you need it.  We recommend signing up for the patient portal called "MyChart".  Sign up information is provided on this After Visit Summary.  MyChart is used to connect with patients for Virtual Visits (Telemedicine).  Patients are able to view lab/test results, encounter notes, upcoming appointments, etc.  Non-urgent messages can be sent to your provider as well.   To learn more about what you can do with MyChart, go to [email or web address].    Your next appointment:   4-5 month(s)  The format for your next appointment:   In Person  Provider:   You may see Nelva Bush, MD or one of the following Advanced Practice Providers on your designated Care Team:    Murray Hodgkins, NP  Christell Faith, PA-C  Marrianne Mood, PA-C  Cadence [place], [place]  [name], NP

## 2020-10-04 NOTE — Progress Notes (Signed)
Office Visit    Patient Name: Crystal [name] Date of Encounter: 10/04/2020  Primary Care Provider:  Martin Majestic, FNP Primary Cardiologist:  Nelva Bush, MD Electrophysiologist:  None   Chief Complaint    Crystal [name] is a 69 y.o. female with a hx of chronic diastolic heart failure, cirrhosis complicated by esophageal varices requiring endoscopic treatment and TI PS, PUD, DM2, lung nodules with nondiagnostic biopsy 01/2019, B12 deficiency, iron deficiency anemia presents today for follow-up of diastolic heart failure  Past Medical History    Past Medical History:  Diagnosis Date  . Acute GI bleeding 02/09/2018  . Acute upper gastrointestinal bleeding 08/03/2018  . Anemia    TAKES IRON TAB  . Arthritis    SHOULDER  . B12 deficiency 02/18/2018  . Bleeding    GI  3/19  . Bronchitis    HX OF  . CHF (congestive heart failure) ([place])   . Cirrhosis ([place])   . Colon cancer screening   . Diabetes mellitus without complication ([place])    TYPE 2  . Fatty (change of) liver, not elsewhere classified   . Full dentures    UPPER AND LOWER  . Iron deficiency anemia due to chronic blood loss 02/18/2018  . Lung nodule, multiple   . Pernicious anemia 02/22/2018  . PUD (peptic ulcer disease)   . Raynaud's syndrome   . Upper GI bleeding 08/03/2018   Past Surgical History:  Procedure Laterality Date  . CATARACT EXTRACTION W/PHACO Right 02/06/2018   Procedure: CATARACT EXTRACTION PHACO AND INTRAOCULAR LENS PLACEMENT ([place]) RIGHT DIABETIC;  Surgeon: Leandrew Koyanagi, MD;  Location: [place];  Service: Ophthalmology;  Laterality: Right;  DIABETIC, ORAL MED  . CATARACT EXTRACTION W/PHACO Left 04/30/2018   Procedure: CATARACT EXTRACTION PHACO AND INTRAOCULAR LENS PLACEMENT (I[facility]);  Surgeon: Leandrew Koyanagi, MD;  Location: [facility];  Service: Ophthalmology;  Laterality: Left;  [place] 01:04 AP% 21.3 CDE 13.66 Fluid pack lot # [id number]  . COLONOSCOPY WITH PROPOFOL N/A 03/25/2018    Procedure: COLONOSCOPY WITH PROPOFOL;  Surgeon: Lin Landsman, MD;  Location: [facility] ENDOSCOPY;  Service: Gastroenterology;  Laterality: N/A;  . DENTAL SURGERY     EXTRACTIONS  . ELECTROMAGNETIC NAVIGATION BROCHOSCOPY Right 01/27/2019   Procedure: ELECTROMAGNETIC NAVIGATION BRONCHOSCOPY;  Surgeon: [name], MD;  Location: [facility];  Service: Cardiopulmonary;  Laterality: Right;  . ENTEROSCOPY N/A 07/28/2020   Procedure: Push ENTEROSCOPY;  Surgeon: Lin Landsman, MD;  Location[facility];  Service: Gastroenterology;  Laterality: N/A;  . ESOPHAGOGASTRODUODENOSCOPY N/A 08/03/2018   Procedure: ESOPHAGOGASTRODUODENOSCOPY (EGD);  Surgeon: Lin Landsman, MD;  Location: [facility] ENDOSCOPY;  Service: Gastroenterology;  Laterality: N/A;  . ESOPHAGOGASTRODUODENOSCOPY (EGD) WITH PROPOFOL N/A 02/10/2018   Procedure: ESOPHAGOGASTRODUODENOSCOPY (EGD) WITH PROPOFOL;  Surgeon: Jonathon Bellows, MD;  Location: [facility] ENDOSCOPY;  Service: Gastroenterology;  Laterality: N/A;  . ESOPHAGOGASTRODUODENOSCOPY (EGD) WITH PROPOFOL N/A 03/25/2018   Procedure: ESOPHAGOGASTRODUODENOSCOPY (EGD) WITH PROPOFOL;  Surgeon: Lin Landsman, MD;  Location: [facility] ENDOSCOPY;  Service: Gastroenterology;  Laterality: N/A;  . ESOPHAGOGASTRODUODENOSCOPY (EGD) WITH PROPOFOL N/A 04/22/2018   Procedure: ESOPHAGOGASTRODUODENOSCOPY (EGD) WITH PROPOFOL with band ligation;  Surgeon: Lin Landsman, MD;  Location: [place];  Service: Gastroenterology;  Laterality: N/A;  . ESOPHAGOGASTRODUODENOSCOPY (EGD) WITH PROPOFOL N/A 06/24/2018   Procedure: ESOPHAGOGASTRODUODENOSCOPY (EGD) WITH PROPOFOL;  Surgeon: Lin Landsman, MD;  Location: [facility] ENDOSCOPY;  Service: Gastroenterology;  Laterality: N/A;  . ESOPHAGOGASTRODUODENOSCOPY (EGD) WITH PROPOFOL N/A 11/19/2018   Procedure: ESOPHAGOGASTRODUODENOSCOPY (EGD) WITH PROPOFOL;  Surgeon: Marius Ditch,  Tally Due, MD;  Location: [place];  Service: Gastroenterology;  Laterality: N/A;    . ESOPHAGOGASTRODUODENOSCOPY (EGD) WITH PROPOFOL N/A 03/08/2020   Procedure: ESOPHAGOGASTRODUODENOSCOPY (EGD) WITH PROPOFOL;  Surgeon: Lin Landsman, MD;  Location: [facility] ENDOSCOPY;  Service: Gastroenterology;  Laterality: N/A;  . EYE SURGERY    . GIVENS CAPSULE STUDY N/A 03/29/2020   Procedure: GIVENS CAPSULE STUDY;  Surgeon: Lin Landsman, MD;  Location: [facility] ENDOSCOPY;  Service: Gastroenterology;  Laterality: N/A;  . HEMORRHOID BANDING  03/25/2018   Procedure: HEMORRHOID BANDING;  Surgeon: Lin Landsman, MD;  Location: [facility] ENDOSCOPY;  Service: Gastroenterology;;  . IR EMBO ART  VEN HEMORR LYMPH EXTRAV  INC GUIDE ROADMAPPING  08/07/2018  . IR RADIOLOGIST EVAL & MGMT  09/03/2018  . IR RADIOLOGIST EVAL & MGMT  12/17/2018  . IR RADIOLOGIST EVAL & MGMT  06/24/2019  . IR RADIOLOGIST EVAL & MGMT  01/22/2020  . IR TIPS  08/07/2018  . PORTA CATH INSERTION N/A 07/30/2020   Procedure: PORTA CATH INSERTION;  Surgeon: Katha Cabal, MD;  Location: [place] CV LAB;  Service: Cardiovascular;  Laterality: N/A;  . RADIOLOGY WITH ANESTHESIA N/A 08/07/2018   Procedure: TIPS;  Surgeon: Corrie Mckusick, DO;  Location: [place];  Service: Anesthesiology;  Laterality: N/A;  . TONSILLECTOMY      Allergies  No Known Allergies  History of Present Illness    Crystal [name] is a 69 y.o. female with a hx of chronic diastolic heart failure, cirrhosis complicated by esophageal varices requiring endoscopic treatment and TI PS, PUD, DM2, lung nodules with nondiagnostic biopsy 01/2019, B12 deficiency, iron deficiency anemia last seen 05/31/20.  She has had coronary artery calcification and aortic atherosclerosis incidentally noted on prior CTs.  No symptoms suggestive of CAD.  Echo 04/04/2019 LVEF 55 to 60%, mild LVH, LV diastolic parameters consistent with impaired relaxation, RV normal size and function, no significant valvular abnormalities, mild dilation of aortic root 3.5 cm.  Clinic visit 12/2019  increased Lasix to 39m until weight returned to baseline as she was 6 pounds up. Underwent EGD 03/08/2020 with GI and capsule study 03/29/2020.  Follows with oncology for B12 deficiency/iron deficiency anemia. LE duplex 03/30/20 ordered by oncology due to LE edema with no evidence of acute DVT bilaterally. Noted chronic DVT, calcified, at the junction of distal common femoral vein and origin of the femoral vein with diminished flow focally at this site. As it was chronic, not recommended for anticoagulation especially in setting of anemia.   Clinic visit 04/14/20 she was 9 lbs over her dry weight. Recommended to increase to Lasix 658mfor one week.   Seen 04/21/20 down 7 lbs. She was recommended to take Lasix 60 mg and 40 mg on alternating days.  Admitted 04/22/20-04/24/20 with hepatic encephalopathy, initial ammonia level 65 Her lactulose was increased to TID. 05/25/20 labs with GI showed worsening renal function and her diuretics were stopped. Echo 05/27/20 with normal LVEF and grade 2 diastolic dysfunction, RV mildly enlarged, no significant valvular abnormalities. She called the office on 05/27/20 noting weight gain was recommended to proceed to the ED for evaluation - BNP of 464.9, creatinine 1.61, GFR 33, hemoglobin 8.6.  CXR with slight increase in size of irregular right middle lung nodule/mass compared to prior CT with recommendation for follow-up CT chest.  Ultrasound of the liver with widely patent TIPS with normal velocities and directional flow.  She was sent home with recommendation for Lasix 60 mg daily.  Seen in  clinic 05/31/20 up 26 pounds compared to 6 weeks prior. She was planning to establish with nephrology. She was recommended for Lasix 67m daily, Spironolactone 583mdaily.   Established with Dr. LaHolley Raringf nephrology. Recommended to continue present diuretic dose. Additional workup included SPEP, UPEP, ANA, renal ultrasound. Renal U/S 06/15/20 was unremarkable with incidental splenomegaly present  on comparison studies.   She was last seen 07/02/20 and doing well from cardiac perspective. 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 She had port-a-cath insertion 07/30/20 by Dr. ScFranchot Galloue to poor IV access. She follows closely with Dr. CoMike Gipf hematology regarding her iron deficiency anemia.   She presents today for follow-up.  She reports no chest pain, pressure, tightness.  Tells me her breathing is at her baseline.  She is up 5 pounds from her weight 3 months ago.  She does endorse increasing her p.o. fluid intake and tells me she will try to reduce again.  She reports intermittent lower extremity edema which improves with elevating her lower extremities.  No orthopnea nor PND.  She is excited to spend Thanksgiving with her family as she has twin great grand daughters who were recently born.   EKGs/Labs/Other Studies Reviewed:   The following studies were reviewed today:  LE duplex 03/30/20 IMPRESSION: 1. No evidence of deep venous thrombosis in either lower extremity in the right lower extremity.   2. Apparent chronic deep venous thrombosis, calcified, at the junction of the distal common femoral vein and origin of the femoral vein with diminished flow focally at this site. No acute appearing deep venous thrombosis seen on the left. No other site of chronic  Echo 03/2019 1. The left ventricle has normal systolic function, with an ejection  fraction of 55-60%. The cavity size was normal. There is mildly increased  left ventricular wall thickness. Left ventricular diastolic Doppler  parameters are consistent with impaired  relaxation.   2. The right ventricle has normal systolic function. The cavity was  normal. There is no increase in right ventricular wall thickness. Unable  to estimate RVSP    3. Left atrial size was mildly dilated.   4. There is dilatation of the aortic root 3.5 cm   EKG: No EKG is ordered today.  The ekg independently reviewed from 07/02/2020 demonstrated SR 65 bpm with low voltage QRS and no acute ST/T wave changes.  Recent Labs: 04/22/2020: ALT 16 05/27/2020: B Natriuretic Peptide 464.9; TSH 3.742 06/08/2020: BUN 18; Creatinine, Ser 1.75; Potassium 3.9; Sodium 139 09/14/2020: Hemoglobin 9.4; Platelets 120  Recent Lipid Panel    Component Value Date/Time   TRIG 163 (H) 08/03/2018 2152    Home Medications   Current Meds  Medication Sig  . albuterol (VENTOLIN HFA) 108 (90 Base) MCG/ACT inhaler Inhale 2 puffs into the lungs every 4 (four) hours as needed.   . Cholecalciferol (VITAMIN D3) 1.25 MG (50000 UT) CAPS Take 1 capsule by mouth once a week.   . cyanocobalamin (,VITAMIN B-12,) 1000 MCG/ML injection Inject 1,000 mcg into the muscle every 30 (thirty) days.   . furosemide (LASIX) 80 MG tablet Take 80 mg by mouth daily.  . hydrOXYzine (ATARAX/VISTARIL) 10 MG tablet TAKE 1 TABLET (10 MG TOTAL) BY MOUTH 3 (THREE) TIMES DAILY AS NEEDED FOR ITCHING.  . Marland KitchenLOR-CON M10 10 MEQ tablet TAKE 10 MG (1 TABLET DAILY) DAILY .  ALTERNATING DAYS TAKE 15 MG (1 AND 1/2 TABLET) DAILY  . lactulose (CHRONULAC) 10 GM/15ML solution Take by mouth 3 (three) times daily.   . Misc. Devices KIT Moderate compression hose 20-30 MM HG  . nystatin (MYCOSTATIN/NYSTOP) powder Apply 1 g topically daily as needed (rash).   . ondansetron (ZOFRAN) 4 MG tablet Take 4 mg by mouth every 8 (eight) hours as needed for nausea or vomiting.   Glory Rosebush Delica Lancets 54Y MISC 2 (two) times daily.  Glory Rosebush ULTRA test strip 2 (two) times daily.  . pantoprazole (PROTONIX) 40 MG tablet Take 40 mg by mouth 2 (two) times daily.  . Potassium Chloride Crys ER (KLOR-CON M10 PO) Take 15 mg by mouth daily.  Marland Kitchen spironolactone (ALDACTONE) 100 MG tablet TAKE 1 TABLET BY MOUTH EVERY DAY  . Vitamin D, Ergocalciferol,  (DRISDOL) 1.25 MG (50000 UNIT) CAPS capsule Take 50,000 Units by mouth once a week.  Marland Kitchen XIFAXAN 550 MG TABS tablet Take 550 mg by mouth 2 (two) times daily.    Review of Systems   All other systems reviewed and are otherwise negative except as noted above.  Physical Exam    VS:  BP 118/64 (BP Location: Left Arm, Patient Position: Sitting, Cuff Size: Normal)   Pulse 68   Ht 5' 3" (1.6 m)   Wt 190 lb (86.2 kg)   LMP  (LMP Unknown)   BMI 33.66 kg/m  , BMI Body mass index is 33.66 kg/m. GEN: Well nourished, overweight, well developed, in no acute distress. HEENT: normal. Neck: Supple, no JVD, carotid bruits, or masses. Cardiac: RRR, no murmurs, rubs, or gallops. No clubbing, cyanosis.  Trace pedal edema.  Radials/PT 2+ and equal bilaterally.  Respiratory:  Respirations regular and unlabored, clear to auscultation bilaterally. GI: Soft, nontender, nondistended MS: No deformity or atrophy. Skin: Warm and dry, no rash. Neuro:  Strength and sensation are intact. Psych: Normal affect.  Assessment & Plan    1. Chronic diastolic heart failure - Echo 05/27/20 normal LVEF 60-65% and gr2DD with mildly dilated RV. Weight up 5 pounds from last clinic visit 3 months ago, notes increased oral fluid intake.  Reports stable DOE and lower extremity edema.  Continue spironolactone 100 mg daily per GI, continue furosemide 80 mg daily.She will call for weight gain of 2lb overnight or 5lb in 1 week.   2. Iron deficiency anemia/B12 deficiency - Follows with Dr. Mike Gip.  Very appreciative of the addition of Port-A-Cath.  3. Chronic DVT - 03/30/20 bilateral LE duplex with on acute DVT and noted chronic DVT to right deep common femoral vein. No indication for anticoagulation as DVT is well calcified. No indication for referral to vascular surgery. LE edema primarily diastolic heart failure, cirrhosis, venous insufficiency in etiology.   4. Coronary artery calcification on CT and aortic atherosclerosis - No  anginal symptoms. No indication for ischemic evaluation at this time. 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   5. Cirrhosis - Continue to follow with Dr. Marius Ditch of GI  6. CKDIIIb - Careful titration of diuretics. Continue to follow with nephrology.   Disposition: Follow up 4-5 with Dr. Saunders Revel or APP.    Loel Dubonnet, NP 10/04/2020, 3:58 PM

## 2020-10-10 ENCOUNTER — Other Ambulatory Visit: Payer: Self-pay | Admitting: Family

## 2020-10-10 DIAGNOSIS — I5032 Chronic diastolic (congestive) heart failure: Secondary | ICD-10-CM

## 2020-10-12 ENCOUNTER — Other Ambulatory Visit: Payer: Self-pay

## 2020-10-12 ENCOUNTER — Other Ambulatory Visit: Payer: Medicare HMO

## 2020-10-12 ENCOUNTER — Inpatient Hospital Stay: Payer: Medicare HMO

## 2020-10-12 ENCOUNTER — Other Ambulatory Visit: Payer: Self-pay | Admitting: Family

## 2020-10-12 DIAGNOSIS — Z79899 Other long term (current) drug therapy: Secondary | ICD-10-CM | POA: Diagnosis not present

## 2020-10-12 DIAGNOSIS — R5383 Other fatigue: Secondary | ICD-10-CM | POA: Diagnosis not present

## 2020-10-12 DIAGNOSIS — E538 Deficiency of other specified B group vitamins: Secondary | ICD-10-CM

## 2020-10-12 DIAGNOSIS — Z23 Encounter for immunization: Secondary | ICD-10-CM | POA: Diagnosis not present

## 2020-10-12 DIAGNOSIS — I509 Heart failure, unspecified: Secondary | ICD-10-CM | POA: Diagnosis not present

## 2020-10-12 DIAGNOSIS — I5032 Chronic diastolic (congestive) heart failure: Secondary | ICD-10-CM

## 2020-10-12 DIAGNOSIS — E119 Type 2 diabetes mellitus without complications: Secondary | ICD-10-CM | POA: Diagnosis not present

## 2020-10-12 DIAGNOSIS — K7460 Unspecified cirrhosis of liver: Secondary | ICD-10-CM | POA: Diagnosis not present

## 2020-10-12 DIAGNOSIS — D509 Iron deficiency anemia, unspecified: Secondary | ICD-10-CM | POA: Diagnosis not present

## 2020-10-12 DIAGNOSIS — R918 Other nonspecific abnormal finding of lung field: Secondary | ICD-10-CM | POA: Diagnosis not present

## 2020-10-12 DIAGNOSIS — R188 Other ascites: Secondary | ICD-10-CM | POA: Diagnosis not present

## 2020-10-12 DIAGNOSIS — H26493 Other secondary cataract, bilateral: Secondary | ICD-10-CM | POA: Diagnosis not present

## 2020-10-12 MED ORDER — CYANOCOBALAMIN 1000 MCG/ML IJ SOLN
INTRAMUSCULAR | Status: AC
  Filled 2020-10-12: qty 1

## 2020-10-12 MED ORDER — CYANOCOBALAMIN 1000 MCG/ML IJ SOLN
1000.0000 ug | Freq: Once | INTRAMUSCULAR | Status: AC
  Administered 2020-10-12: 1000 ug via INTRAMUSCULAR

## 2020-10-12 NOTE — Telephone Encounter (Signed)
Per documentation, Furosemide has been d/c and changed to another drug. Pharmacy notified of denied status

## 2020-10-14 DIAGNOSIS — R600 Localized edema: Secondary | ICD-10-CM | POA: Diagnosis not present

## 2020-10-14 DIAGNOSIS — N184 Chronic kidney disease, stage 4 (severe): Secondary | ICD-10-CM | POA: Diagnosis not present

## 2020-10-14 DIAGNOSIS — D631 Anemia in chronic kidney disease: Secondary | ICD-10-CM | POA: Diagnosis not present

## 2020-10-17 ENCOUNTER — Other Ambulatory Visit: Payer: Self-pay | Admitting: Family

## 2020-10-17 DIAGNOSIS — I5032 Chronic diastolic (congestive) heart failure: Secondary | ICD-10-CM

## 2020-10-18 NOTE — Telephone Encounter (Signed)
Reviewed the patient's RX with [name], NP.  Ok per Almyra to continue lasix 40 mg BID.

## 2020-10-18 NOTE — Telephone Encounter (Signed)
Pt mentioned that she takes Furosemide 40 mg tablet bid.  Pt made aware that we had that she was taking Furosemide 80 mg qd. Pt mentioned that her pcp normally fills this medication and would like to request the medication to be filled. Please advise if ok to refill.

## 2020-10-18 NOTE — Telephone Encounter (Signed)
Lmovm to verify if pt is taking Furosemide confirm dose/instructions.

## 2020-10-19 ENCOUNTER — Encounter: Payer: Self-pay | Admitting: Family Medicine

## 2020-10-21 DIAGNOSIS — R69 Illness, unspecified: Secondary | ICD-10-CM | POA: Diagnosis not present

## 2020-10-25 ENCOUNTER — Telehealth: Payer: Self-pay | Admitting: Gastroenterology

## 2020-10-25 NOTE — Telephone Encounter (Signed)
Patient calling stating she needs a prior auth for lactulose sent to [facility] pharmacy.

## 2020-10-25 NOTE — Telephone Encounter (Signed)
Called the pharmacy and it does not need a prior authorization. She is just out of refills. Can this be refilled

## 2020-10-26 DIAGNOSIS — H26492 Other secondary cataract, left eye: Secondary | ICD-10-CM | POA: Diagnosis not present

## 2020-10-26 MED ORDER — LACTULOSE 10 GM/15ML PO SOLN: 10.0000 g | Freq: Three times a day (TID) | ORAL | 2 refills | Status: DC

## 2020-10-26 NOTE — Telephone Encounter (Signed)
Sent medication to the pharmacy

## 2020-10-26 NOTE — Telephone Encounter (Signed)
Yes

## 2020-11-09 ENCOUNTER — Inpatient Hospital Stay: Payer: Medicare HMO | Attending: Hematology and Oncology

## 2020-11-09 ENCOUNTER — Other Ambulatory Visit: Payer: Medicare HMO

## 2020-11-09 ENCOUNTER — Other Ambulatory Visit: Payer: Self-pay

## 2020-11-09 ENCOUNTER — Inpatient Hospital Stay: Payer: Medicare HMO

## 2020-11-09 VITALS — BP 125/61 | HR 76 | Temp 96.0°F | Resp 18 | Wt 181.8 lb

## 2020-11-09 DIAGNOSIS — D509 Iron deficiency anemia, unspecified: Secondary | ICD-10-CM | POA: Diagnosis not present

## 2020-11-09 DIAGNOSIS — E538 Deficiency of other specified B group vitamins: Secondary | ICD-10-CM | POA: Diagnosis not present

## 2020-11-09 DIAGNOSIS — D500 Iron deficiency anemia secondary to blood loss (chronic): Secondary | ICD-10-CM

## 2020-11-09 LAB — IRON AND TIBC
Iron: 152 ug/dL (ref 28–170)
Saturation Ratios: 82 % — ABNORMAL HIGH (ref 10.4–31.8)
TIBC: 186 ug/dL — ABNORMAL LOW (ref 250–450)
UIBC: 34 ug/dL

## 2020-11-09 LAB — CBC WITH DIFFERENTIAL/PLATELET
Abs Immature Granulocytes: 0.02 10*3/uL (ref 0.00–0.07)
Basophils Absolute: 0 10*3/uL (ref 0.0–0.1)
Basophils Relative: 0 %
Eosinophils Absolute: 0.1 10*3/uL (ref 0.0–0.5)
Eosinophils Relative: 1 %
HCT: 33.3 % — ABNORMAL LOW (ref 36.0–46.0)
Hemoglobin: 10.8 g/dL — ABNORMAL LOW (ref 12.0–15.0)
Immature Granulocytes: 0 %
Lymphocytes Relative: 27 %
Lymphs Abs: 1.4 10*3/uL (ref 0.7–4.0)
MCH: 26 pg (ref 26.0–34.0)
MCHC: 32.4 g/dL (ref 30.0–36.0)
MCV: 80.2 fL (ref 80.0–100.0)
Monocytes Absolute: 0.5 10*3/uL (ref 0.1–1.0)
Monocytes Relative: 10 %
Neutro Abs: 3.1 10*3/uL (ref 1.7–7.7)
Neutrophils Relative %: 62 %
Platelets: 127 10*3/uL — ABNORMAL LOW (ref 150–400)
RBC: 4.15 MIL/uL (ref 3.87–5.11)
RDW: 16.7 % — ABNORMAL HIGH (ref 11.5–15.5)
WBC: 5 10*3/uL (ref 4.0–10.5)
nRBC: 0 % (ref 0.0–0.2)

## 2020-11-09 LAB — FERRITIN: Ferritin: 42 ng/mL (ref 11–307)

## 2020-11-09 MED ORDER — HEPARIN SOD (PORK) LOCK FLUSH 100 UNIT/ML IV SOLN
500.0000 [IU] | Freq: Once | INTRAVENOUS | Status: AC | PRN
  Administered 2020-11-09: 500 [IU]
  Filled 2020-11-09: qty 5

## 2020-11-09 MED ORDER — CYANOCOBALAMIN 1000 MCG/ML IJ SOLN
1000.0000 ug | Freq: Once | INTRAMUSCULAR | Status: AC
  Administered 2020-11-09: 1000 ug via INTRAMUSCULAR
  Filled 2020-11-09: qty 1

## 2020-11-09 MED ORDER — SODIUM CHLORIDE 0.9% FLUSH
10.0000 mL | Freq: Once | INTRAVENOUS | Status: AC | PRN
  Administered 2020-11-09: 10 mL
  Filled 2020-11-09: qty 10

## 2020-11-15 ENCOUNTER — Other Ambulatory Visit: Payer: Self-pay

## 2020-11-15 MED ORDER — SPIRONOLACTONE 100 MG PO TABS: 100.0000 mg | ORAL_TABLET | Freq: Every day | ORAL | 2 refills | Status: DC

## 2020-11-15 NOTE — Telephone Encounter (Signed)
Last office visit 06/01/2020 Cirrhosis  Last refill

## 2020-11-23 ENCOUNTER — Other Ambulatory Visit: Payer: Self-pay

## 2020-11-23 ENCOUNTER — Telehealth: Payer: Self-pay | Admitting: Family

## 2020-11-23 ENCOUNTER — Ambulatory Visit
Admission: RE | Admit: 2020-11-23 | Discharge: 2020-11-23 | Disposition: A | Discharge status: Home / Self Care | Payer: Medicare HMO | Source: Ambulatory Visit | Attending: Sports Medicine | Admitting: Sports Medicine

## 2020-11-23 VITALS — BP 131/71 | HR 62 | Temp 97.9°F | Resp 17 | Ht 62.0 in | Wt 181.7 lb

## 2020-11-23 DIAGNOSIS — L02419 Cutaneous abscess of limb, unspecified: Secondary | ICD-10-CM

## 2020-11-23 DIAGNOSIS — M7989 Other specified soft tissue disorders: Secondary | ICD-10-CM | POA: Diagnosis not present

## 2020-11-23 DIAGNOSIS — L03119 Cellulitis of unspecified part of limb: Secondary | ICD-10-CM | POA: Diagnosis not present

## 2020-11-23 MED ORDER — CEPHALEXIN 500 MG PO CAPS: 500.0000 mg | ORAL_CAPSULE | Freq: Three times a day (TID) | ORAL | 0 refills | Status: DC

## 2020-11-23 MED ORDER — DOXYCYCLINE HYCLATE 100 MG PO CAPS: 100.0000 mg | ORAL_CAPSULE | Freq: Two times a day (BID) | ORAL | 0 refills | Status: DC

## 2020-11-23 NOTE — Telephone Encounter (Signed)
Patient states her Furosemide requires a prior authorization. Please call to discuss

## 2020-11-23 NOTE — ED Triage Notes (Signed)
Pt reports a "sore" on the back of her lower leg which is now draining pus.  Noticed it about 4 days ago.

## 2020-11-23 NOTE — Discharge Instructions (Addendum)
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  Will prescribe Keflex as well as doxycycline. We will give her an educational handout on cellulitis and abscess. Advised her to do warm compresses to the area to try to see if we can get some of that pustule material to come to the surface.  Unfortunately if she starts therapy will make it difficult to get an accurate culture.  That said, I have asked her to come back to the clinic in 2 days for a wound check.  She will make that appointment for January 13. Supportive care, Tylenol Motrin for fever or discomfort. She does have chronic renal issues and is followed by nephrology and has bilateral pitting edema that is at baseline.  This will certainly complicate her treatment. Depending on how she does over the next 48 hours she may be a good candidate to refer to wound care. See her back in 2 days.

## 2020-11-23 NOTE — Telephone Encounter (Signed)
[employer].  They confirmed that patient did not need a PA for her Furosemide.  She just picked up a 90 day supply 10/25/2020.   Will call patient and confirm this with her as well.

## 2020-11-23 NOTE — ED Provider Notes (Signed)
[facility]    CSN: [id number] Arrival date & time: 11/23/20  1101      History   Chief Complaint Chief Complaint  Patient presents with  . sore on leg  . Appointment    HPI Crystal [name] is a 70 y.o. female.   Pleasant 70 year old female who presents for evaluation of the above issue.  She reports 4 days of redness, warmth, and a "sore" in the back of her right calf.  She said that she squeezed on it yesterday and expressed a lot of puslike material.  It is closed over today.  No fever shakes chills.  No nausea vomiting diarrhea.  She denies any shortness of breath or chest pain.  She lives with her grandson and is not exposed to MRSA.     Past Medical History:  Diagnosis Date  . Acute GI bleeding 02/09/2018  . Acute upper gastrointestinal bleeding 08/03/2018  . Anemia    TAKES IRON TAB  . Arthritis    SHOULDER  . B12 deficiency 02/18/2018  . Bleeding    GI  3/19  . Bronchitis    HX OF  . CHF (congestive heart failure) ([place])   . Cirrhosis ([place])   . Colon cancer screening   . Diabetes mellitus without complication ([place])    TYPE 2  . Fatty (change of) liver, not elsewhere classified   . Full dentures    UPPER AND LOWER  . Iron deficiency anemia due to chronic blood loss 02/18/2018  . Lung nodule, multiple   . Pernicious anemia 02/22/2018  . PUD (peptic ulcer disease)   . Raynaud's syndrome   . Upper GI bleeding 08/03/2018    Patient Active Problem List   Diagnosis Date Noted  . Pulmonary nodules 05/02/2020  . Hepatic encephalopathy ([place]) 04/23/2020  . Dehydration 04/23/2020  . Left ovarian cyst 10/06/2019  . Hypokalemia 08/21/2019  . AMS (altered mental status) 08/20/2019  . Acute osteomyelitis of phalanx of hand ([place]) 06/12/2019  . Pain in finger of right hand 06/04/2019  . Type 2 diabetes mellitus without complication, without long-term current use of insulin ([place]) 04/02/2019  . Aortic atherosclerosis ([place]) 04/02/2019  . Coronary artery disease  involving native coronary artery of native heart without angina pectoris 04/02/2019  . Chronic heart failure with preserved ejection fraction ([place]) 04/02/2019  . Lung nodules   . Volume overload 12/11/2018  . GAVE (gastric antral vascular ectasia)   . 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 Type 2 diabetes mellitus with stage 3 chronic kidney disease ([place]) 08/03/2018  . Hx of esophageal varices   . 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 Angiodysplasia of small intestine ([place])   . Adnexal cyst 03/16/2018  . Goals of care, counseling/discussion 03/16/2018  . Right middle lobe pulmonary nodule 03/14/2018  . Right lower lobe pulmonary nodule 02/18/2018  . Iron deficiency anemia due to chronic blood loss 02/18/2018  . B12 deficiency 02/18/2018  . 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 Hepatitis C virus infection without hepatic coma 02/18/2018    Past Surgical History:  Procedure Laterality Date  . CATARACT EXTRACTION W/PHACO Right 02/06/2018   Procedure: CATARACT EXTRACTION PHACO AND INTRAOCULAR LENS PLACEMENT ([place]) RIGHT DIABETIC;  Surgeon: Leandrew Koyanagi, MD;  Location: [facility];  Service: Ophthalmology;  Laterality: Right;  DIABETIC, ORAL MED  . CATARACT EXTRACTION W/PHACO Left 04/30/2018   Procedure: CATARACT EXTRACTION PHACO AND INTRAOCULAR LENS PLACEMENT (IOC);  Surgeon: Leandrew Koyanagi, MD;  Location: [facility]  ORS;  Service: Ophthalmology;  Laterality: Left;  [place] 01:04 AP% 21.3 CDE 13.66 Fluid pack lot # [id number]  . COLONOSCOPY WITH PROPOFOL N/A 03/25/2018   Procedure: COLONOSCOPY WITH PROPOFOL;  Surgeon: Lin Landsman, MD;  Location: [facility] ENDOSCOPY;  Service: Gastroenterology;  Laterality: N/A;  . DENTAL SURGERY     EXTRACTIONS  . ELECTROMAGNETIC NAVIGATION BROCHOSCOPY Right 01/27/2019   Procedure: ELECTROMAGNETIC NAVIGATION BRONCHOSCOPY;  Surgeon: [name], MD;  Location: [facility] ORS;  Service: Cardiopulmonary;   Laterality: Right;  . ENTEROSCOPY N/A 07/28/2020   Procedure: Push ENTEROSCOPY;  Surgeon: Lin Landsman, MD;  Location: [facility] ENDOSCOPY;  Service: Gastroenterology;  Laterality: N/A;  . ESOPHAGOGASTRODUODENOSCOPY N/A 08/03/2018   Procedure: ESOPHAGOGASTRODUODENOSCOPY (EGD);  Surgeon: Lin Landsman, MD;  Location: [facility] ENDOSCOPY;  Service: Gastroenterology;  Laterality: N/A;  . ESOPHAGOGASTRODUODENOSCOPY (EGD) WITH PROPOFOL N/A 02/10/2018   Procedure: ESOPHAGOGASTRODUODENOSCOPY (EGD) WITH PROPOFOL;  Surgeon: Jonathon Bellows, MD;  Location: [facility] ENDOSCOPY;  Service: Gastroenterology;  Laterality: N/A;  . ESOPHAGOGASTRODUODENOSCOPY (EGD) WITH PROPOFOL N/A 03/25/2018   Procedure: ESOPHAGOGASTRODUODENOSCOPY (EGD) WITH PROPOFOL;  Surgeon: Lin Landsman, MD;  Location: [facility] ENDOSCOPY;  Service: Gastroenterology;  Laterality: N/A;  . ESOPHAGOGASTRODUODENOSCOPY (EGD) WITH PROPOFOL N/A 04/22/2018   Procedure: ESOPHAGOGASTRODUODENOSCOPY (EGD) WITH PROPOFOL with band ligation;  Surgeon: Lin Landsman, MD;  Location: [place];  Service: Gastroenterology;  Laterality: N/A;  . ESOPHAGOGASTRODUODENOSCOPY (EGD) WITH PROPOFOL N/A 06/24/2018   Procedure: ESOPHAGOGASTRODUODENOSCOPY (EGD) WITH PROPOFOL;  Surgeon: Lin Landsman, MD;  Location: [facility] ENDOSCOPY;  Service: Gastroenterology;  Laterality: N/A;  . ESOPHAGOGASTRODUODENOSCOPY (EGD) WITH PROPOFOL N/A 11/19/2018   Procedure: ESOPHAGOGASTRODUODENOSCOPY (EGD) WITH PROPOFOL;  Surgeon: Lin Landsman, MD;  Location: [facility] ENDOSCOPY;  Service: Gastroenterology;  Laterality: N/A;  . ESOPHAGOGASTRODUODENOSCOPY (EGD) WITH PROPOFOL N/A 03/08/2020   Procedure: ESOPHAGOGASTRODUODENOSCOPY (EGD) WITH PROPOFOL;  Surgeon: Lin Landsman, MD;  Location: [facility] ENDOSCOPY;  Service: Gastroenterology;  Laterality: N/A;  . EYE SURGERY    . GIVENS CAPSULE STUDY N/A 03/29/2020   Procedure: GIVENS CAPSULE STUDY;  Surgeon: Lin Landsman, MD;  Location:  [facility] ENDOSCOPY;  Service: Gastroenterology;  Laterality: N/A;  . HEMORRHOID BANDING  03/25/2018   Procedure: HEMORRHOID BANDING;  Surgeon: Lin Landsman, MD;  Location: [facility] ENDOSCOPY;  Service: Gastroenterology;;  . IR EMBO ART  VEN HEMORR LYMPH EXTRAV  INC GUIDE ROADMAPPING  08/07/2018  . IR RADIOLOGIST EVAL & MGMT  09/03/2018  . IR RADIOLOGIST EVAL & MGMT  12/17/2018  . IR RADIOLOGIST EVAL & MGMT  06/24/2019  . IR RADIOLOGIST EVAL & MGMT  01/22/2020  . IR TIPS  08/07/2018  . PORTA CATH INSERTION N/A 07/30/2020   Procedure: PORTA CATH INSERTION;  Surgeon: Katha Cabal, MD;  Location: [place] CV LAB;  Service: Cardiovascular;  Laterality: N/A;  . RADIOLOGY WITH ANESTHESIA N/A 08/07/2018   Procedure: TIPS;  Surgeon: Corrie Mckusick, DO;  Location: [place];  Service: Anesthesiology;  Laterality: N/A;  . TONSILLECTOMY      OB History   No obstetric history on file.      Home Medications    Prior to Admission medications   Medication Sig Start Date End Date Taking? Authorizing Provider  cephALEXin (KEFLEX) 500 MG capsule Take 1 capsule (500 mg total) by mouth 3 (three) times daily. 11/23/20  Yes Verda Cumins, MD  doxycycline (VIBRAMYCIN) 100 MG capsule Take 1 capsule (100 mg total) by mouth 2 (two) times daily. 11/23/20  Yes Verda Cumins, MD  albuterol (VENTOLIN HFA) 108 (90 Base) MCG/ACT inhaler  Inhale 2 puffs into the lungs every 4 (four) hours as needed.  05/06/20   [provider]  Cholecalciferol (VITAMIN D3) 1.25 MG (50000 UT) CAPS Take 1 capsule by mouth once a week.  05/06/20   [provider]  cyanocobalamin (,VITAMIN B-12,) 1000 MCG/ML injection Inject 1,000 mcg into the muscle every 30 (thirty) days.     [provider]  furosemide (LASIX) 40 MG tablet Take 1 tablet (40 mg total) by mouth 2 (two) times daily. 10/18/20   Loel Dubonnet, NP  hydrOXYzine (ATARAX/VISTARIL) 10 MG tablet TAKE 1 TABLET (10 MG TOTAL) BY MOUTH 3 (THREE) TIMES DAILY  AS NEEDED FOR ITCHING. 11/03/19   Vanga, Tally Due, MD  KLOR-CON M10 10 MEQ tablet TAKE 10 MG (1 TABLET DAILY) DAILY . ALTERNATING DAYS TAKE 15 MG (1 AND 1/2 TABLET) DAILY 09/21/20   Loel Dubonnet, NP  lactulose (CHRONULAC) 10 GM/15ML solution Take 15 mLs (10 g total) by mouth 3 (three) times daily. 10/26/20 11/25/20  Lin Landsman, MD  Misc. Devices KIT Moderate compression hose 20-30 MM HG 10/14/19   Vanga, Tally Due, MD  nystatin (MYCOSTATIN/NYSTOP) powder Apply 1 g topically daily as needed (rash).  05/23/18   [provider]  ondansetron (ZOFRAN) 4 MG tablet Take 4 mg by mouth every 8 (eight) hours as needed for nausea or vomiting.     [provider]  OneTouch Delica Lancets 14E MISC 2 (two) times daily. 12/01/19   [provider]  [facility] ULTRA test strip 2 (two) times daily. 11/23/19   [provider]  pantoprazole (PROTONIX) 40 MG tablet Take 40 mg by mouth 2 (two) times daily. 06/29/19   [provider]  Potassium Chloride Crys ER (KLOR-CON M10 PO) Take 15 mg by mouth daily.    [provider]  spironolactone (ALDACTONE) 100 MG tablet Take 1 tablet (100 mg total) by mouth daily. 11/15/20   Lin Landsman, MD  Vitamin D, Ergocalciferol, (DRISDOL) 1.25 MG (50000 UNIT) CAPS capsule Take 50,000 Units by mouth once a week. 08/02/20   [provider]  XIFAXAN 550 MG TABS tablet Take 550 mg by mouth 2 (two) times daily. 06/24/20   [provider]    Family History Family History  Problem Relation Age of Onset  . CAD Father   . Heart attack Father 63  . Cancer Maternal Uncle   . Seizures Mother     Social History Social History   Tobacco Use  . Smoking status: Never Smoker  . Smokeless tobacco: Never Used  Vaping Use  . Vaping Use: Never used  Substance Use Topics  . Alcohol use: Not Currently    Comment: No EtOH for 30 years.  Never a heavy drinker.  . Drug use: Never     Allergies   Patient  has no known allergies.   Review of Systems Review of Systems  Constitutional: Negative for activity change, appetite change, chills, diaphoresis, fatigue and fever.  HENT: Negative.  Negative for congestion.   Respiratory: Negative.   Cardiovascular: Negative.   Gastrointestinal: Negative.   Genitourinary: Negative.   Skin: Positive for color change, rash and wound.       There is a small punctate in the right calf consistent with an abscess.  It is not actively draining.  There is an area of 10 cm diameter around the punctate that is erythematous and tender to the touch with underlying soft tissue swelling.  Bevelyn Buckles and [employer] test  are negative.  There is no fluctuance.  She has 2+ pitting edema bilateral lower extremities from the mid tibia distally.  This is chronic for her.  She is being followed by nephrology and is on chronic Lasix therapy.  All other systems reviewed and are negative.    Physical Exam Triage Vital Signs ED Triage Vitals  Enc Vitals Group     BP 11/23/20 1150 131/71     Pulse Rate 11/23/20 1150 62     Resp 11/23/20 1150 17     Temp 11/23/20 1150 97.9 F (36.6 C)     Temp Source 11/23/20 1150 Oral     SpO2 --      Weight 11/23/20 1153 181 lb 10.5 oz (82.4 kg)     Height 11/23/20 1153 5' 2" (1.575 m)     Head Circumference --      Peak Flow --      Pain Score 11/23/20 1150 5     Pain Loc --      Pain Edu? --      Excl. in [place]? --    No data found.  Updated Vital Signs BP 131/71 (BP Location: Left Arm)   Pulse 62   Temp 97.9 F (36.6 C) (Oral)   Resp 17   Ht 5' 2" (1.575 m)   Wt 82.4 kg   LMP  (LMP Unknown)   BMI 33.23 kg/m   Visual Acuity Right Eye Distance:   Left Eye Distance:   Bilateral Distance:    Right Eye Near:   Left Eye Near:    Bilateral Near:     Physical Exam   UC Treatments / Results  Labs (all labs ordered are listed, but only abnormal results are displayed) Labs Reviewed - No data to  display  EKG   Radiology No results found.  Procedures Procedures (including critical care time)  Medications Ordered in UC Medications - No data to display  Initial Impression / Assessment and Plan / UC Course  I have reviewed the triage vital signs and the nursing notes.  Pertinent labs & imaging results that were available during my care of the patient were reviewed by me and considered in my medical decision making (see chart for details).  Clinical impression: 4 days of cellulitis and an abscess.  Patient actively expressed a lot of pus yesterday.  Unfortunately I am unable to express any pus today to send it for a culture.  I will treat empirically.  Treatment plan: 1.  The findings and treatment plan were discussed in detail with the patient.  The patient was in agreement. 2.  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  Will prescribe Keflex as well as doxycycline. 3.  We will give her an educational handout on cellulitis and abscess. 4.  Advised her to do warm compresses to the area to try to see if we can get some of that pustule material to come to the surface.  Unfortunately if she starts therapy will make it difficult to get an accurate culture.  That said, I have asked her to come back to the clinic in 2 days for a wound check.  She will make that appointment for January 13. 5.  Supportive care, Tylenol Motrin for fever or discomfort. 6.  She does have chronic renal issues and is followed by nephrology and has bilateral pitting edema that is at baseline.  This will certainly complicate her treatment. 7.  Depending on how she does over the next 48 hours she may be a good candidate to refer to wound care. 8. See her back in 2 days.    Final Clinical Impressions(s) / UC Diagnoses   Final diagnoses:  Cellulitis and abscess of leg  Leg swelling     Discharge Instructions     I have recommended that we go ahead and cover her for both MRSA  and MSSA.  Will prescribe Keflex as well as doxycycline. We will give her an educational handout on cellulitis and abscess. Advised her to do warm compresses to the area to try to see if we can get some of that pustule material to come to the surface.  Unfortunately if she starts therapy will make it difficult to get an accurate culture.  That said, I have asked her to come back to the clinic in 2 days for a wound check.  She will make that appointment for January 13. Supportive care, Tylenol Motrin for fever or discomfort. She does have chronic renal issues and is followed by nephrology and has bilateral pitting edema that is at baseline.  This will certainly complicate her treatment. Depending on how she does over the next 48 hours she may be a good candidate to refer to wound care. See her back in 2 days.    ED Prescriptions    Medication Sig Dispense Auth. Provider   cephALEXin (KEFLEX) 500 MG capsule Take 1 capsule (500 mg total) by mouth 3 (three) times daily. 30 capsule Verda Cumins, MD   doxycycline (VIBRAMYCIN) 100 MG capsule Take 1 capsule (100 mg total) by mouth 2 (two) times daily. 20 capsule Verda Cumins, MD     PDMP not reviewed this encounter.   Verda Cumins, MD 11/23/20 [phone]

## 2020-11-24 DIAGNOSIS — H5213 Myopia, bilateral: Secondary | ICD-10-CM | POA: Diagnosis not present

## 2020-11-25 ENCOUNTER — Ambulatory Visit
Admission: EM | Admit: 2020-11-25 | Discharge: 2020-11-25 | Disposition: A | Discharge status: Home / Self Care | Payer: Medicare HMO | Attending: Sports Medicine | Admitting: Sports Medicine

## 2020-11-25 ENCOUNTER — Other Ambulatory Visit: Payer: Self-pay

## 2020-11-25 ENCOUNTER — Ambulatory Visit: Payer: Self-pay

## 2020-11-25 ENCOUNTER — Encounter: Payer: Self-pay | Admitting: Emergency Medicine

## 2020-11-25 DIAGNOSIS — L03116 Cellulitis of left lower limb: Secondary | ICD-10-CM | POA: Diagnosis not present

## 2020-11-25 DIAGNOSIS — L0291 Cutaneous abscess, unspecified: Secondary | ICD-10-CM | POA: Diagnosis not present

## 2020-11-25 DIAGNOSIS — Z5189 Encounter for other specified aftercare: Secondary | ICD-10-CM | POA: Diagnosis not present

## 2020-11-25 NOTE — ED Triage Notes (Signed)
Patient here for wound check to her right lower leg.  Patient seen on [place] for abscess.  Patient reports drainage from the site.  Patient denies fevers.

## 2020-11-25 NOTE — Discharge Instructions (Addendum)
I just asked her to reach out to her renal physician and let them know that she has been seen in the ER for this and if she was to worsen in any way or the antibiotics were to affect her renal function that she could be seen emergently. Finish out the antibiotics and their completion. Continue with warm compresses. If things were to worsen in any way she should seek out immediate medical attention or come back here.  She voiced verbal understanding. Follow-up here as needed

## 2020-11-25 NOTE — ED Provider Notes (Signed)
[facility]    CSN: [id number] Arrival date & time: 11/25/20  7096      History   Chief Complaint Chief Complaint  Patient presents with  . Abscess  . Wound Check    HPI Crystal [name] is a 70 y.o. female.   Patient pleasant 70 year old female who presents for follow-up evaluation of an abscess and cellulitis in her right lower extremity.  I saw her on 11/23/2020.  Please see that note for full details.  She had expressed a lot of pus out of the area the day prior to arrival and I was unable to express any additional pus.  She had no fluctuance but she has some baseline renal insufficiency is followed by nephrology and has bilateral lower extremity swelling that is chronic.  She had 2+ pitting edema.  There was some erythema around the wound but no active drainage.  Put her on antibiotics.  Since then she denies any fevers chest pain or shortness of breath.  She feels as though her leg is improving but it still gives her discomfort around that area.  She has been using warm compresses but has been unable to express any additional material and there has been no further pustular material come to the surface of her leg.     Past Medical History:  Diagnosis Date  . Acute GI bleeding 02/09/2018  . Acute upper gastrointestinal bleeding 08/03/2018  . Anemia    TAKES IRON TAB  . Arthritis    SHOULDER  . B12 deficiency 02/18/2018  . Bleeding    GI  3/19  . Bronchitis    HX OF  . CHF (congestive heart failure) ([place])   . Cirrhosis ([place])   . Colon cancer screening   . Diabetes mellitus without complication ([place])    TYPE 2  . Fatty (change of) liver, not elsewhere classified   . Full dentures    UPPER AND LOWER  . Iron deficiency anemia due to chronic blood loss 02/18/2018  . Lung nodule, multiple   . Pernicious anemia 02/22/2018  . PUD (peptic ulcer disease)   . Raynaud's syndrome   . Upper GI bleeding 08/03/2018    Patient Active Problem List   Diagnosis Date Noted   . Pulmonary nodules 05/02/2020  . Hepatic encephalopathy ([place]) 04/23/2020  . Dehydration 04/23/2020  . Left ovarian cyst 10/06/2019  . Hypokalemia 08/21/2019  . AMS (altered mental status) 08/20/2019  . Acute osteomyelitis of phalanx of hand ([place]) 06/12/2019  . Pain in finger of right hand 06/04/2019  . Type 2 diabetes mellitus without complication, without long-term current use of insulin ([place]) 04/02/2019  . Aortic atherosclerosis ([place]) 04/02/2019  . Coronary artery disease involving native coronary artery of native heart without angina pectoris 04/02/2019  . Chronic heart failure with preserved ejection fraction ([place]) 04/02/2019  . Lung nodules   . Volume overload 12/11/2018  . GAVE (gastric antral vascular ectasia)   . 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 Type 2 diabetes mellitus with stage 3 chronic kidney disease ([place]) 08/03/2018  . Hx of esophageal varices   . 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 Angiodysplasia of small intestine ([place])   . Adnexal cyst 03/16/2018  . Goals of care, counseling/discussion 03/16/2018  . Right middle lobe pulmonary nodule 03/14/2018  . Right lower lobe pulmonary nodule 02/18/2018  . Iron deficiency anemia due to chronic blood loss 02/18/2018  . B12 deficiency 02/18/2018  . 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  Hepatitis C virus infection without hepatic coma 02/18/2018    Past Surgical History:  Procedure Laterality Date  . CATARACT EXTRACTION W/PHACO Right 02/06/2018   Procedure: CATARACT EXTRACTION PHACO AND INTRAOCULAR LENS PLACEMENT ([place]) RIGHT DIABETIC;  Surgeon: Leandrew Koyanagi, MD;  Location: [place];  Service: Ophthalmology;  Laterality: Right;  DIABETIC, ORAL MED  . CATARACT EXTRACTION W/PHACO Left 04/30/2018   Procedure: CATARACT EXTRACTION PHACO AND INTRAOCULAR LENS PLACEMENT (IOC);  Surgeon: Leandrew Koyanagi, MD;  Location: [facility];  Service: Ophthalmology;  Laterality:  Left;  [place] 01:04 AP% 21.3 CDE 13.66 Fluid pack lot # [id number]  . COLONOSCOPY WITH PROPOFOL N/A 03/25/2018   Procedure: COLONOSCOPY WITH PROPOFOL;  Surgeon: Lin Landsman, MD;  Location: [facility] ENDOSCOPY;  Service: Gastroenterology;  Laterality: N/A;  . DENTAL SURGERY     EXTRACTIONS  . ELECTROMAGNETIC NAVIGATION BROCHOSCOPY Right 01/27/2019   Procedure: ELECTROMAGNETIC NAVIGATION BRONCHOSCOPY;  Surgeon: [name], MD;  Location: [facility];  Service: Cardiopulmonary;  Laterality: Right;  . ENTEROSCOPY N/A 07/28/2020   Procedure: Push ENTEROSCOPY;  Surgeon: Lin Landsman, MD;  Location: [facility] ENDOSCOPY;  Service: Gastroenterology;  Laterality: N/A;  . ESOPHAGOGASTRODUODENOSCOPY N/A 08/03/2018   Procedure: ESOPHAGOGASTRODUODENOSCOPY (EGD);  Surgeon: Lin Landsman, MD;  Location: [facility] ENDOSCOPY;  Service: Gastroenterology;  Laterality: N/A;  . ESOPHAGOGASTRODUODENOSCOPY (EGD) WITH PROPOFOL N/A 02/10/2018   Procedure: ESOPHAGOGASTRODUODENOSCOPY (EGD) WITH PROPOFOL;  Surgeon: Jonathon Bellows, MD;  Location: [facility] ENDOSCOPY;  Service: Gastroenterology;  Laterality: N/A;  . ESOPHAGOGASTRODUODENOSCOPY (EGD) WITH PROPOFOL N/A 03/25/2018   Procedure: ESOPHAGOGASTRODUODENOSCOPY (EGD) WITH PROPOFOL;  Surgeon: Lin Landsman, MD;  Location: [facility] ENDOSCOPY;  Service: Gastroenterology;  Laterality: N/A;  . ESOPHAGOGASTRODUODENOSCOPY (EGD) WITH PROPOFOL N/A 04/22/2018   Procedure: ESOPHAGOGASTRODUODENOSCOPY (EGD) WITH PROPOFOL with band ligation;  Surgeon: Lin Landsman, MD;  Location: [place];  Service: Gastroenterology;  Laterality: N/A;  . ESOPHAGOGASTRODUODENOSCOPY (EGD) WITH PROPOFOL N/A 06/24/2018   Procedure: ESOPHAGOGASTRODUODENOSCOPY (EGD) WITH PROPOFOL;  Surgeon: Lin Landsman, MD;  Location: [name] ENDOSCOPY;  Service: Gastroenterology;  Laterality: N/A;  . ESOPHAGOGASTRODUODENOSCOPY (EGD) WITH PROPOFOL N/A 11/19/2018   Procedure: ESOPHAGOGASTRODUODENOSCOPY (EGD) WITH  PROPOFOL;  Surgeon: Lin Landsman, MD;  Location: [facility] ENDOSCOPY;  Service: Gastroenterology;  Laterality: N/A;  . ESOPHAGOGASTRODUODENOSCOPY (EGD) WITH PROPOFOL N/A 03/08/2020   Procedure: ESOPHAGOGASTRODUODENOSCOPY (EGD) WITH PROPOFOL;  Surgeon: Lin Landsman, MD;  Location: [facility] ENDOSCOPY;  Service: Gastroenterology;  Laterality: N/A;  . EYE SURGERY    . GIVENS CAPSULE STUDY N/A 03/29/2020   Procedure: GIVENS CAPSULE STUDY;  Surgeon: Lin Landsman, MD;  Location: [facility] ENDOSCOPY;  Service: Gastroenterology;  Laterality: N/A;  . HEMORRHOID BANDING  03/25/2018   Procedure: HEMORRHOID BANDING;  Surgeon: Lin Landsman, MD;  Location: [facility] ENDOSCOPY;  Service: Gastroenterology;;  . IR EMBO ART  VEN HEMORR LYMPH EXTRAV  INC GUIDE ROADMAPPING  08/07/2018  . IR RADIOLOGIST EVAL & MGMT  09/03/2018  . IR RADIOLOGIST EVAL & MGMT  12/17/2018  . IR RADIOLOGIST EVAL & MGMT  06/24/2019  . IR RADIOLOGIST EVAL & MGMT  01/22/2020  . IR TIPS  08/07/2018  . PORTA CATH INSERTION N/A 07/30/2020   Procedure: PORTA CATH INSERTION;  Surgeon: Katha Cabal, MD;  Location: [place] CV [facility];  Service: Cardiovascular;  Laterality: N/A;  . RADIOLOGY WITH ANESTHESIA N/A 08/07/2018   Procedure: TIPS;  Surgeon: Corrie Mckusick, DO;  Location: [place];  Service: Anesthesiology;  Laterality: N/A;  . TONSILLECTOMY      OB History   No obstetric history  on file.      Home Medications    Prior to Admission medications   Medication Sig Start Date End Date Taking? Authorizing Provider  albuterol (VENTOLIN HFA) 108 (90 Base) MCG/ACT inhaler Inhale 2 puffs into the lungs every 4 (four) hours as needed.  05/06/20   [provider]  cephALEXin (KEFLEX) 500 MG capsule Take 1 capsule (500 mg total) by mouth 3 (three) times daily. 11/23/20   Verda Cumins, MD  Cholecalciferol (VITAMIN D3) 1.25 MG (50000 UT) CAPS Take 1 capsule by mouth once a week.  05/06/20   [provider]  cyanocobalamin  (,VITAMIN B-12,) 1000 MCG/ML injection Inject 1,000 mcg into the muscle every 30 (thirty) days.     [provider]  doxycycline (VIBRAMYCIN) 100 MG capsule Take 1 capsule (100 mg total) by mouth 2 (two) times daily. 11/23/20   Verda Cumins, MD  furosemide (LASIX) 40 MG tablet Take 1 tablet (40 mg total) by mouth 2 (two) times daily. 10/18/20   Loel Dubonnet, NP  hydrOXYzine (ATARAX/VISTARIL) 10 MG tablet TAKE 1 TABLET (10 MG TOTAL) BY MOUTH 3 (THREE) TIMES DAILY AS NEEDED FOR ITCHING. 11/03/19   Vanga, Tally Due, MD  KLOR-CON M10 10 MEQ tablet TAKE 10 MG (1 TABLET DAILY) DAILY . ALTERNATING DAYS TAKE 15 MG (1 AND 1/2 TABLET) DAILY 09/21/20   Loel Dubonnet, NP  lactulose (CHRONULAC) 10 GM/15ML solution Take 15 mLs (10 g total) by mouth 3 (three) times daily. 10/26/20 11/25/20  Lin Landsman, MD  Misc. Devices KIT Moderate compression hose 20-30 MM HG 10/14/19   Vanga, Tally Due, MD  nystatin (MYCOSTATIN/NYSTOP) powder Apply 1 g topically daily as needed (rash).  05/23/18   [provider]  ondansetron (ZOFRAN) 4 MG tablet Take 4 mg by mouth every 8 (eight) hours as needed for nausea or vomiting.     [provider]  OneTouch Delica Lancets 16X MISC 2 (two) times daily. 12/01/19   [provider]  [facility] ULTRA test strip 2 (two) times daily. 11/23/19   [provider]  pantoprazole (PROTONIX) 40 MG tablet Take 40 mg by mouth 2 (two) times daily. 06/29/19   [provider]  Potassium Chloride Crys ER (KLOR-CON M10 PO) Take 15 mg by mouth daily.    [provider]  spironolactone (ALDACTONE) 100 MG tablet Take 1 tablet (100 mg total) by mouth daily. 11/15/20   Lin Landsman, MD  Vitamin D, Ergocalciferol, (DRISDOL) 1.25 MG (50000 UNIT) CAPS capsule Take 50,000 Units by mouth once a week. 08/02/20   [provider]  XIFAXAN 550 MG TABS tablet Take 550 mg by mouth 2 (two) times daily. 06/24/20   [provider]    Family History Family History  Problem Relation Age of Onset  . CAD Father   . Heart attack Father 56  . Cancer Maternal Uncle   . Seizures Mother     Social History Social History   Tobacco Use  . Smoking status: Never Smoker  . Smokeless tobacco: Never Used  Vaping Use  . Vaping Use: Never used  Substance Use Topics  . Alcohol use: Not Currently    Comment: No EtOH for 30 years.  Never a heavy drinker.  . Drug use: Never     Allergies   Patient has no known allergies.   Review of Systems Review of Systems  Constitutional: Negative for chills, fatigue and fever.  HENT: Negative for congestion, ear pain, rhinorrhea, sinus  pressure, sinus pain and sore throat.   Eyes: Negative for pain.  Respiratory: Negative for apnea, cough, shortness of breath and wheezing.   Cardiovascular: Negative for chest pain and palpitations.  Gastrointestinal: Negative for abdominal pain.  Genitourinary: Negative for dysuria.  Musculoskeletal: Negative for myalgias.  Skin: Positive for color change and wound. Negative for pallor and rash.  Neurological: Negative for dizziness, syncope, numbness and headaches.  All other systems reviewed and are negative.    Physical Exam Triage Vital Signs ED Triage Vitals  Enc Vitals Group     BP 11/25/20 0824 (!) 121/59     Pulse Rate 11/25/20 0824 62     Resp 11/25/20 0824 14     Temp 11/25/20 0824 97.9 F (36.6 C)     Temp Source 11/25/20 0824 Oral     SpO2 11/25/20 0824 97 %     Weight 11/25/20 0822 180 lb (81.6 kg)     Height 11/25/20 0822 5' 2" (1.575 m)     Head Circumference --      Peak Flow --      Pain Score 11/25/20 0821 0     Pain Loc --      Pain Edu? --      Excl. in [place]? --    No data found.  Updated Vital Signs BP (!) 121/59 (BP Location: Right Arm)   Pulse 62   Temp 97.9 F (36.6 C) (Oral)   Resp 14   Ht 5' 2" (1.575 m)   Wt 81.6 kg   LMP  (LMP Unknown)   SpO2 97%   BMI 32.92 kg/m   Visual  Acuity Right Eye Distance:   Left Eye Distance:   Bilateral Distance:    Right Eye Near:   Left Eye Near:    Bilateral Near:     Physical Exam Vitals and nursing note reviewed.  Constitutional:      General: She is not in acute distress.    Appearance: Normal appearance. She is not ill-appearing or toxic-appearing.  HENT:     Head: Normocephalic and atraumatic.  Eyes:     Extraocular Movements: Extraocular movements intact.     Pupils: Pupils are equal, round, and reactive to light.  Cardiovascular:     Rate and Rhythm: Normal rate and regular rhythm.     Pulses: Normal pulses.     Heart sounds: Normal heart sounds.  Pulmonary:     Effort: Pulmonary effort is normal.     Breath sounds: Normal breath sounds.  Skin:    General: Skin is warm and dry.     Findings: Erythema and lesion present.     Comments: There is a persistent small punctate on the left lower extremity just below the gastrocnemius.  It is closed and not actively draining.  There is some surrounding erythema that is much less than 2 days ago.  There is some minimal tenderness to palpation.  There is a little hardness and induration around that area but it is difficult to ascertain whether or not this is due to her chronic lower extremity edema as there is pitting from the mid tibia distally.  The area of cellulitis has improved and is no worse.  Neurological:     Mental Status: She is alert.      UC Treatments / Results  Labs (all labs ordered are listed, but only abnormal results are displayed) Labs Reviewed - No data to display  EKG   Radiology No results found.  Procedures Procedures (including critical care time)  Medications Ordered in UC Medications - No data to display  Initial Impression / Assessment and Plan / UC Course  I have reviewed the triage vital signs and the nursing notes.  Pertinent labs & imaging results that were available during my care of the patient were reviewed by me and  considered in my medical decision making (see chart for details).  Clinical impression: Is an 70 year old female being followed for left lower extremity cellulitis with associated abscess that is improving on antibiotics.  Treatment plan  1 the findings and treatment plan were discussed in detail with the patient.  Patient was in agreement. 2.  I just asked her to reach out to her renal physician and let them know that she has been seen in the ER for this and if she was to worsen in any way or the antibiotics were to affect her renal function that she could be seen emergently. 3.  Elizebeth Koller out the antibiotics and their completion. 4.  Continue with warm compresses. 5.  If things were to worsen in any way she should seek out immediate medical attention or come back here.  She voiced verbal understanding. 6.  Follow-up here as needed    Final Clinical Impressions(s) / UC Diagnoses   Final diagnoses:  Abscess  Cellulitis of left lower extremity  Visit for wound check     Discharge Instructions     I just asked her to reach out to her renal physician and let them know that she has been seen in the ER for this and if she was to worsen in any way or the antibiotics were to affect her renal function that she could be seen emergently. Finish out the antibiotics and their completion. Continue with warm compresses. If things were to worsen in any way she should seek out immediate medical attention or come back here.  She voiced verbal understanding. Follow-up here as needed    ED Prescriptions    None     PDMP not reviewed this encounter.   Verda Cumins, MD 11/25/20 0900

## 2020-11-26 NOTE — Telephone Encounter (Signed)
Patient calling back in stating she has no recollection of picking up medication in December and states she only has 3 days left

## 2020-11-26 NOTE — Telephone Encounter (Signed)
Please see note below.

## 2020-11-29 ENCOUNTER — Other Ambulatory Visit: Payer: Self-pay

## 2020-11-29 DIAGNOSIS — I5032 Chronic diastolic (congestive) heart failure: Secondary | ICD-10-CM

## 2020-11-29 MED ORDER — FUROSEMIDE 40 MG PO TABS: 40.0000 mg | ORAL_TABLET | Freq: Two times a day (BID) | ORAL | 1 refills | Status: DC

## 2020-11-29 NOTE — Telephone Encounter (Signed)
Resent medication to patients pharmacy

## 2020-12-02 ENCOUNTER — Telehealth: Payer: Self-pay

## 2020-12-02 ENCOUNTER — Telehealth: Payer: Self-pay | Admitting: Family

## 2020-12-02 ENCOUNTER — Emergency Department: Payer: Medicare HMO

## 2020-12-02 ENCOUNTER — Encounter: Payer: Self-pay | Admitting: Emergency Medicine

## 2020-12-02 ENCOUNTER — Other Ambulatory Visit: Payer: Self-pay | Admitting: Gastroenterology

## 2020-12-02 ENCOUNTER — Other Ambulatory Visit: Payer: Self-pay

## 2020-12-02 ENCOUNTER — Emergency Department
Admission: EM | Admit: 2020-12-02 | Discharge: 2020-12-02 | Disposition: A | Discharge status: Home / Self Care | Payer: Medicare HMO | Attending: Emergency Medicine | Admitting: Emergency Medicine

## 2020-12-02 DIAGNOSIS — Z5321 Procedure and treatment not carried out due to patient leaving prior to being seen by health care provider: Secondary | ICD-10-CM | POA: Insufficient documentation

## 2020-12-02 DIAGNOSIS — R1900 Intra-abdominal and pelvic swelling, mass and lump, unspecified site: Secondary | ICD-10-CM | POA: Insufficient documentation

## 2020-12-02 DIAGNOSIS — R0602 Shortness of breath: Secondary | ICD-10-CM | POA: Diagnosis not present

## 2020-12-02 DIAGNOSIS — R600 Localized edema: Secondary | ICD-10-CM | POA: Insufficient documentation

## 2020-12-02 LAB — BASIC METABOLIC PANEL
Anion gap: 16 — ABNORMAL HIGH (ref 5–15)
BUN: 23 mg/dL (ref 8–23)
CO2: 18 mmol/L — ABNORMAL LOW (ref 22–32)
Calcium: 8.8 mg/dL — ABNORMAL LOW (ref 8.9–10.3)
Chloride: 101 mmol/L (ref 98–111)
Creatinine, Ser: 1.81 mg/dL — ABNORMAL HIGH (ref 0.44–1.00)
GFR, Estimated: 30 mL/min — ABNORMAL LOW (ref >60)
Glucose, Bld: 90 mg/dL (ref 70–99)
Potassium: 4.4 mmol/L (ref 3.5–5.1)
Sodium: 135 mmol/L (ref 135–145)

## 2020-12-02 LAB — CBC
HCT: 33.6 % — ABNORMAL LOW (ref 36.0–46.0)
Hemoglobin: 10.7 g/dL — ABNORMAL LOW (ref 12.0–15.0)
MCH: 26.3 pg (ref 26.0–34.0)
MCHC: 31.8 g/dL (ref 30.0–36.0)
MCV: 82.6 fL (ref 80.0–100.0)
Platelets: 141 10*3/uL — ABNORMAL LOW (ref 150–400)
RBC: 4.07 MIL/uL (ref 3.87–5.11)
RDW: 16.9 % — ABNORMAL HIGH (ref 11.5–15.5)
WBC: 5.5 10*3/uL (ref 4.0–10.5)
nRBC: 0 % (ref 0.0–0.2)

## 2020-12-02 LAB — BRAIN NATRIURETIC PEPTIDE: B Natriuretic Peptide: 434.5 pg/mL — ABNORMAL HIGH (ref 0.0–100.0)

## 2020-12-02 LAB — TROPONIN I (HIGH SENSITIVITY): Troponin I (High Sensitivity): 13 ng/L (ref <18)

## 2020-12-02 NOTE — Telephone Encounter (Signed)
Given rapid increase in weight with edema and abdominal distention in the setting of cirrhosis status post TIPS, I would be most concerned that portal hypertension may be driving her symptoms.  She should continue her furosemide and also reach out to her gastroenterologist to see if further evaluation is needed.  We will plan to see her in the office next week as scheduled.  If her symptoms worsen, she should go to the ED.  Nelva Bush, MD [facility]

## 2020-12-02 NOTE — Telephone Encounter (Signed)
Recommend ultrasound liver Doppler to evaluate TIPS patency If patient is not short of breath,  We can increase the dose of Lasix and spironolactone.  If she is short of breath, recommend patient going to emergency room  RV

## 2020-12-02 NOTE — Telephone Encounter (Signed)
Pt c/o swelling: STAT is pt has developed SOB within 24 hours  How much weight have you gained and in what time span? 22 pounds since Jan 13 1) If swelling, where is the swelling located? Bilateral legs and stomach  2) Are you currently taking a fluid pill? yes  3) Are you currently SOB? If she gets up and moves around, yes  4) Do you have a log of your daily weights (if so, list)? No daily log  Have you gained 3 pounds in a day or 5 pounds in a week? yes  5) Have you traveled recently? no

## 2020-12-02 NOTE — Telephone Encounter (Signed)
Patient states she needs refill of medication lactalose sent into [facility]. Pt also wants to notify Dr. Marius Ditch that she has gained 22 pounds since the 13th of January and is worried about the swelling.

## 2020-12-02 NOTE — Telephone Encounter (Signed)
Spoke with patient and reviewed recommendations from provider. She verbalized understanding of all instructions with no further questions at this time.

## 2020-12-02 NOTE — ED Notes (Signed)
2 attempts made for blood draw.  LAb called for labs.

## 2020-12-02 NOTE — Telephone Encounter (Signed)
Spoke with patient and she reports that she is "filled up with swelling everywhere". Weight yesterday was 200 and today it is up to 202. She did recently go to Urgent Care due to problem with her leg and was prescribed 2 antibiotics. She states that she had sore on leg and diagnosed her with abscess. She reports swelling to bilateral legs and abdomen. Offered her appointment next week and advised that I would route this message to provider and if any recommendations would giver her a call back. She verbalized understanding with no further questions at this time.

## 2020-12-02 NOTE — Telephone Encounter (Signed)
Pt  wants to notify Dr. Marius Ditch that she has gained 22 pounds since the 13th of January and is worried about the swelling.

## 2020-12-02 NOTE — ED Triage Notes (Signed)
Pt comes into the ED via POV c/o SHOB, leg swelling, and abdominal swelling.  Pt states she has had 22lbs of weight gain in a week and she is now seeing swelling in her legs and abdomen.  Pt denies any CHF history.  Pt gets winded with exertion.  Pt currently has even and unlabored respirations.  Pt denies any CP, dizziness, or nausea.

## 2020-12-02 NOTE — ED Notes (Signed)
Pt states she has Raynauds.

## 2020-12-02 NOTE — Telephone Encounter (Signed)
Patient states she is having sob with activity . She is taking Lasix 37m 4 tablets in the morning and Spirolactone 107monce a day. She states she has swelling in her stomach and legs. She is trying to follow a low salt diet. Talk to Dr. VaMarius Ditchnd she wants patient to go to the ER to get seen. She asked if she could wait and go tomorrow. Informed patient that she needed to go now to get evaluated. She verbalized understanding and states she will go

## 2020-12-03 ENCOUNTER — Encounter: Payer: Self-pay | Admitting: Emergency Medicine

## 2020-12-03 ENCOUNTER — Telehealth: Payer: Self-pay | Admitting: Gastroenterology

## 2020-12-03 ENCOUNTER — Emergency Department
Admission: EM | Admit: 2020-12-03 | Discharge: 2020-12-03 | Disposition: A | Discharge status: Home / Self Care | Payer: Medicare HMO | Attending: Emergency Medicine | Admitting: Emergency Medicine

## 2020-12-03 ENCOUNTER — Emergency Department: Payer: Medicare HMO

## 2020-12-03 DIAGNOSIS — N183 Chronic kidney disease, stage 3 unspecified: Secondary | ICD-10-CM | POA: Insufficient documentation

## 2020-12-03 DIAGNOSIS — E8779 Other fluid overload: Secondary | ICD-10-CM

## 2020-12-03 DIAGNOSIS — I110 Hypertensive heart disease with heart failure: Secondary | ICD-10-CM | POA: Diagnosis not present

## 2020-12-03 DIAGNOSIS — I2510 Atherosclerotic heart disease of native coronary artery without angina pectoris: Secondary | ICD-10-CM | POA: Diagnosis not present

## 2020-12-03 DIAGNOSIS — E8770 Fluid overload, unspecified: Secondary | ICD-10-CM | POA: Insufficient documentation

## 2020-12-03 DIAGNOSIS — R600 Localized edema: Secondary | ICD-10-CM | POA: Diagnosis not present

## 2020-12-03 DIAGNOSIS — I509 Heart failure, unspecified: Secondary | ICD-10-CM | POA: Diagnosis not present

## 2020-12-03 DIAGNOSIS — I5033 Acute on chronic diastolic (congestive) heart failure: Secondary | ICD-10-CM | POA: Diagnosis not present

## 2020-12-03 DIAGNOSIS — E1122 Type 2 diabetes mellitus with diabetic chronic kidney disease: Secondary | ICD-10-CM | POA: Insufficient documentation

## 2020-12-03 LAB — BASIC METABOLIC PANEL
Anion gap: 14 (ref 5–15)
BUN: 22 mg/dL (ref 8–23)
CO2: 17 mmol/L — ABNORMAL LOW (ref 22–32)
Calcium: 8.6 mg/dL — ABNORMAL LOW (ref 8.9–10.3)
Chloride: 103 mmol/L (ref 98–111)
Creatinine, Ser: 1.79 mg/dL — ABNORMAL HIGH (ref 0.44–1.00)
GFR, Estimated: 31 mL/min — ABNORMAL LOW (ref >60)
Glucose, Bld: 118 mg/dL — ABNORMAL HIGH (ref 70–99)
Potassium: 4.1 mmol/L (ref 3.5–5.1)
Sodium: 134 mmol/L — ABNORMAL LOW (ref 135–145)

## 2020-12-03 LAB — CBC WITH DIFFERENTIAL/PLATELET
Abs Immature Granulocytes: 0.01 10*3/uL (ref 0.00–0.07)
Basophils Absolute: 0 10*3/uL (ref 0.0–0.1)
Basophils Relative: 1 %
Eosinophils Absolute: 0.1 10*3/uL (ref 0.0–0.5)
Eosinophils Relative: 2 %
HCT: 33.2 % — ABNORMAL LOW (ref 36.0–46.0)
Hemoglobin: 10.6 g/dL — ABNORMAL LOW (ref 12.0–15.0)
Immature Granulocytes: 0 %
Lymphocytes Relative: 26 %
Lymphs Abs: 1.3 10*3/uL (ref 0.7–4.0)
MCH: 26.2 pg (ref 26.0–34.0)
MCHC: 31.9 g/dL (ref 30.0–36.0)
MCV: 82.2 fL (ref 80.0–100.0)
Monocytes Absolute: 0.5 10*3/uL (ref 0.1–1.0)
Monocytes Relative: 11 %
Neutro Abs: 2.9 10*3/uL (ref 1.7–7.7)
Neutrophils Relative %: 60 %
Platelets: 120 10*3/uL — ABNORMAL LOW (ref 150–400)
RBC: 4.04 MIL/uL (ref 3.87–5.11)
RDW: 16.9 % — ABNORMAL HIGH (ref 11.5–15.5)
WBC: 4.8 10*3/uL (ref 4.0–10.5)
nRBC: 0 % (ref 0.0–0.2)

## 2020-12-03 LAB — BRAIN NATRIURETIC PEPTIDE: B Natriuretic Peptide: 514.6 pg/mL — ABNORMAL HIGH (ref 0.0–100.0)

## 2020-12-03 MED ORDER — FUROSEMIDE 10 MG/ML IJ SOLN
60.0000 mg | Freq: Once | INTRAMUSCULAR | Status: AC
  Administered 2020-12-03: 60 mg via INTRAVENOUS
  Filled 2020-12-03: qty 8

## 2020-12-03 NOTE — ED Notes (Signed)
Pt found by the back wall of the WR, informed pt she should be the next pt to be roomed. Pt states she had been in the BR earlier.

## 2020-12-03 NOTE — Telephone Encounter (Signed)
Patient states she waiting 3 hours but left after the 3 hours states that they did blood work and chest xray. They told her it would be at least 5 hours before the ER doctor would see her. She said she was not staying for that. She said she is still having the swelling and shortness of breath with activity. Patient wants to know what your recommend from her blood work

## 2020-12-03 NOTE — ED Triage Notes (Signed)
Pt to ED via POV c/o swelling in her legs up to her abdomen. Pt states that she came yesterday but left before being seen. Pt states that her cardiologist and her GI doctor called her today and told her that she needed to come back and be seen. Pt ambulatory to triage without difficulty or distress. Pt is in NAD.

## 2020-12-03 NOTE — ED Notes (Signed)
Patient ambulatory to restroom with steady gait, denies shortness of breath.

## 2020-12-03 NOTE — Telephone Encounter (Signed)
She will have to get admitted because of her kidney issue and 22lbs weight gain, she might need IV medications to get rid of fluid  RV

## 2020-12-03 NOTE — Discharge Instructions (Signed)
As we discussed, you have evidence of a mild heart failure exacerbation and fluid retention.  Please continue to take all of her medications at home, including her Lasix as prescribed.  We gave you an extra dose of Lasix to help you get more fluid off.  Return to the ED with any worsening symptoms. You can use compression socks or Ace bandages to help squeeze the extra fluid out of your legs.  Use Tylenol for pain and fevers.  Up to 1000 mg per dose, up to 4 times per day.  Do not take more than 4000 mg of Tylenol/acetaminophen within 24 hours.Marland Kitchen

## 2020-12-03 NOTE — ED Provider Notes (Signed)
[facility] Emergency Department Provider Note ____________________________________________   Event Date/Time   First MD Initiated Contact with Patient 12/03/20 2102     (approximate)  I have reviewed the triage vital signs and the nursing notes.  HISTORY  Chief Complaint fluid retention   HPI Crystal [name] is a 70 y.o. femalewho presents to the ED for evaluation of fluid retention.  Chart review indicates history of diastolic CHF on Lasix, cirrhosis and ascites on Aldactone.  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  Patient presents to the ED with acute on chronic bilateral lower extremity edema, extending up to her abdomen.  Patient reports that has been present for couple weeks, progressively worsening.  She reports a degree of mild orthopnea, but denies any shortness of breath, cough, chest pain, syncope, fever, focal leg pain, trauma.    Past Medical History:  Diagnosis Date   Acute GI bleeding 02/09/2018   Acute upper gastrointestinal bleeding 08/03/2018   Anemia    TAKES IRON TAB   Arthritis    SHOULDER   B12 deficiency 02/18/2018   Bleeding    GI  3/19   Bronchitis    HX OF   CHF (congestive heart failure) (HCC)    Cirrhosis ([place])    Colon cancer screening    Diabetes mellitus without complication ([place])    TYPE 2   Fatty (change of) liver, not elsewhere classified    Full dentures    UPPER AND LOWER   Iron deficiency anemia due to chronic blood loss 02/18/2018   Lung nodule, multiple    Pernicious anemia 02/22/2018   PUD (peptic ulcer disease)    Raynaud's syndrome    Upper GI bleeding 08/03/2018    Patient Active Problem List   Diagnosis Date Noted   Pulmonary nodules 05/02/2020   Hepatic encephalopathy ([place]) 04/23/2020   Dehydration 04/23/2020   Left ovarian cyst 10/06/2019   Hypokalemia 08/21/2019   AMS (altered mental status) 08/20/2019   Acute  osteomyelitis of phalanx of hand ([place]) 06/12/2019   Pain in finger of right hand 06/04/2019   Type 2 diabetes mellitus without complication, without long-term current use of insulin ([place]) 04/02/2019   Aortic atherosclerosis ([place]) 04/02/2019   Coronary artery disease involving native coronary artery of native heart without angina pectoris 04/02/2019   Chronic heart failure with preserved ejection fraction ([place]) 04/02/2019   Lung nodules    Volume overload 12/11/2018   GAVE (gastric antral vascular ectasia)    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   Type 2 diabetes mellitus with stage 3 chronic kidney disease ([place]) 08/03/2018   Hx of esophageal varices    Liver, cirrhosis, 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[place])    Angiodysplasia of small intestine ([place])    Adnexal cyst 03/16/2018   Goals of care, counseling/discussion 03/16/2018   Right middle lobe pulmonary nodule 03/14/2018   Right lower lobe pulmonary nodule 02/18/2018   Iron deficiency anemia due to chronic blood loss 02/18/2018   B12 deficiency 02/18/2018   Anticentromere antibodies present 02/18/2018   Hepatitis C virus infection without hepatic coma 02/18/2018    Past Surgical History:  Procedure Laterality Date   CATARACT EXTRACTION W/PHACO Right 02/06/2018   Procedure: CATARACT EXTRACTION PHACO AND INTRAOCULAR LENS PLACEMENT ([place]) RIGHT DIABETIC;  Surgeon: Leandrew Koyanagi, MD;  Location: [employer];  Service: Ophthalmology;  Laterality: Right;  DIABETIC, ORAL MED   CATARACT EXTRACTION W/PHACO Left 04/30/2018   Procedure: CATARACT EXTRACTION  PHACO AND INTRAOCULAR LENS PLACEMENT (IOC);  Surgeon: Leandrew Koyanagi, MD;  Location: [facility];  Service: Ophthalmology;  Laterality: Left;  [place] 01:04 AP% 21.3 CDE 13.66 Fluid pack lot # [id number]   COLONOSCOPY WITH PROPOFOL N/A 03/25/2018   Procedure: COLONOSCOPY WITH PROPOFOL;  Surgeon: Lin Landsman, MD;  Location: [facility] ENDOSCOPY;   Service: Gastroenterology;  Laterality: N/A;   DENTAL SURGERY     EXTRACTIONS   ELECTROMAGNETIC NAVIGATION BROCHOSCOPY Right 01/27/2019   Procedure: ELECTROMAGNETIC NAVIGATION BRONCHOSCOPY;  Surgeon: [name], MD;  Location: [facility];  Service: Cardiopulmonary;  Laterality: Right;   ENTEROSCOPY N/A 07/28/2020   Procedure: Push ENTEROSCOPY;  Surgeon: Lin Landsman, MD;  Location: [facility] ENDOSCOPY;  Service: Gastroenterology;  Laterality: N/A;   ESOPHAGOGASTRODUODENOSCOPY N/A 08/03/2018   Procedure: ESOPHAGOGASTRODUODENOSCOPY (EGD);  Surgeon: Lin Landsman, MD;  Location: [facility] ENDOSCOPY;  Service: Gastroenterology;  Laterality: N/A;   ESOPHAGOGASTRODUODENOSCOPY (EGD) WITH PROPOFOL N/A 02/10/2018   Procedure: ESOPHAGOGASTRODUODENOSCOPY (EGD) WITH PROPOFOL;  Surgeon: Jonathon Bellows, MD;  Location: [facility] ENDOSCOPY;  Service: Gastroenterology;  Laterality: N/A;   ESOPHAGOGASTRODUODENOSCOPY (EGD) WITH PROPOFOL N/A 03/25/2018   Procedure: ESOPHAGOGASTRODUODENOSCOPY (EGD) WITH PROPOFOL;  Surgeon: Lin Landsman, MD;  Location: [facility] ENDOSCOPY;  Service: Gastroenterology;  Laterality: N/A;   ESOPHAGOGASTRODUODENOSCOPY (EGD) WITH PROPOFOL N/A 04/22/2018   Procedure: ESOPHAGOGASTRODUODENOSCOPY (EGD) WITH PROPOFOL with band ligation;  Surgeon: Lin Landsman, MD;  Location: [place];  Service: Gastroenterology;  Laterality: N/A;   ESOPHAGOGASTRODUODENOSCOPY (EGD) WITH PROPOFOL N/A 06/24/2018   Procedure: ESOPHAGOGASTRODUODENOSCOPY (EGD) WITH PROPOFOL;  Surgeon: Lin Landsman, MD;  Location: [facility] ENDOSCOPY;  Service: Gastroenterology;  Laterality: N/A;   ESOPHAGOGASTRODUODENOSCOPY (EGD) WITH PROPOFOL N/A 11/19/2018   Procedure: ESOPHAGOGASTRODUODENOSCOPY (EGD) WITH PROPOFOL;  Surgeon: Lin Landsman, MD;  Location: [place];  Service: Gastroenterology;  Laterality: N/A;   ESOPHAGOGASTRODUODENOSCOPY (EGD) WITH PROPOFOL N/A 03/08/2020   Procedure:  ESOPHAGOGASTRODUODENOSCOPY (EGD) WITH PROPOFOL;  Surgeon: Lin Landsman, MD;  Location: [facility] END[facility]COPY;  Service: Gastroenterology;  Laterality: N/A;   EYE SURGERY     GIVENS CAPSULE STUDY N/A 03/29/2020   Procedure: GIVENS CAPSULE STUDY;  Surgeon: Lin Landsman, MD;  Location: [facility] ENDOSCOPY;  Service: Gastroenterology;  Laterality: N/A;   HEMORRHOID BANDING  03/25/2018   Procedure: HEMORRHOID BANDING;  Surgeon: Lin Landsman, MD;  Location: [facility] ENDOSCOPY;  Service: Gastroenterology;;   IR EMBO ART  VEN HEMORR LYMPH EXTRAV  INC GUIDE ROADMAPPING  08/07/2018   IR RADIOLOGIST EVAL & MGMT  09/03/2018   IR RADIOLOGIST EVAL & MGMT  12/17/2018   IR RADIOLOGIST EVAL & MGMT  06/24/2019   IR RADIOLOGIST EVAL & MGMT  01/22/2020   IR TIPS  08/07/2018   PORTA CATH INSERTION N/A 07/30/2020   Procedure: PORTA CATH INSERTION;  Surgeon: Katha Cabal, MD;  Location: [place] [facility];  Service: Cardiovascular;  Laterality: N/A;   RADIOLOGY WITH ANESTHESIA N/A 08/07/2018   Procedure: TIPS;  Surgeon: Corrie Mckusick, DO;  Location: [place];  Service: Anesthesiology;  Laterality: N/A;   TONSILLECTOMY      Prior to Admission medications   Medication Sig Start Date End Date Taking? Authorizing Provider  albuterol (VENTOLIN HFA) 108 (90 Base) MCG/ACT inhaler Inhale 2 puffs into the lungs every 4 (four) hours as needed.  05/06/20   [provider]  cephALEXin (KEFLEX) 500 MG capsule Take 1 capsule (500 mg total) by mouth 3 (three) times daily. 11/23/20   Verda Cumins, MD  Cholecalciferol (VITAMIN D3) 1.25 MG (50000 UT) CAPS Take  1 capsule by mouth once a week.  05/06/20   [provider]  cyanocobalamin (,VITAMIN B-12,) 1000 MCG/ML injection Inject 1,000 mcg into the muscle every 30 (thirty) days.     [provider]  doxycycline (VIBRAMYCIN) 100 MG capsule Take 1 capsule (100 mg total) by mouth 2 (two) times daily. 11/23/20   Verda Cumins, MD  furosemide  (LASIX) 40 MG tablet Take 1 tablet (40 mg total) by mouth 2 (two) times daily. 11/29/20   Loel Dubonnet, NP  hydrOXYzine (ATARAX/VISTARIL) 10 MG tablet TAKE 1 TABLET (10 MG TOTAL) BY MOUTH 3 (THREE) TIMES DAILY AS NEEDED FOR ITCHING. 11/03/19   Vanga, Tally Due, MD  KLOR-CON M10 10 MEQ tablet TAKE 10 MG (1 TABLET DAILY) DAILY . ALTERNATING DAYS TAKE 15 MG (1 AND 1/2 TABLET) DAILY 09/21/20   Loel Dubonnet, NP  lactulose (CHRONULAC) 10 GM/15ML solution TAKE 15 MLS (10 G TOTAL) BY MOUTH 3 (THREE) TIMES DAILY. 12/02/20 01/01/21  Lin Landsman, MD  Misc. Devices KIT Moderate compression hose 20-30 MM HG 10/14/19   Vanga, Tally Due, MD  nystatin (MYCOSTATIN/NYSTOP) powder Apply 1 g topically daily as needed (rash).  05/23/18   [provider]  ondansetron (ZOFRAN) 4 MG tablet Take 4 mg by mouth every 8 (eight) hours as needed for nausea or vomiting.     [provider]  OneTouch Delica Lancets 29B MISC 2 (two) times daily. 12/01/19   [provider]  [facility] ULTRA test strip 2 (two) times daily. 11/23/19   [provider]  pantoprazole (PROTONIX) 40 MG tablet Take 40 mg by mouth 2 (two) times daily. 06/29/19   [provider]  Potassium Chloride Crys ER (KLOR-CON M10 PO) Take 15 mg by mouth daily.    [provider]  spironolactone (ALDACTONE) 100 MG tablet Take 1 tablet (100 mg total) by mouth daily. 11/15/20   Lin Landsman, MD  Vitamin D, Ergocalciferol, (DRISDOL) 1.25 MG (50000 UNIT) CAPS capsule Take 50,000 Units by mouth once a week. 08/02/20   [provider]  XIFAXAN 550 MG TABS tablet Take 550 mg by mouth 2 (two) times daily. 06/24/20   [provider]    Allergies Patient has no known allergies.  Family History  Problem Relation Age of Onset   CAD Father    Heart attack Father 63   Cancer Maternal Uncle    Seizures Mother     Social History Social History   Tobacco Use   Smoking status: Never  Smoker   Smokeless tobacco: Never Used  Scientific laboratory technician Use: Never used  Substance Use Topics   Alcohol use: Not Currently    Comment: No EtOH for 30 years.  Never a heavy drinker.   Drug use: Never    Review of Systems  Constitutional: No fever/chills Eyes: No visual changes. ENT: No sore throat. Cardiovascular: Denies chest pain. Respiratory: Denies shortness of breath.Positive for orthopnea. Gastrointestinal: No abdominal pain.  No nausea, no vomiting.  No diarrhea.  No constipation. Genitourinary: Negative for dysuria. Musculoskeletal: Negative for back pain.  Positive for lower extremity edema. Skin: Negative for rash. Neurological: Negative for headaches, focal weakness or numbness.  ____________________________________________   PHYSICAL EXAM:  VITAL SIGNS: Vitals:   12/03/20 2115 12/03/20 2200  BP: (!) 117/55 (!) 116/57  Pulse: (!) 56 75  Resp: 16 18  Temp:    SpO2: 98% 99%     Constitutional: Alert and oriented. Well appearing and  in no acute distress.  Obese, pleasant and conversational in full sentences. Eyes: Conjunctivae are normal. PERRL. EOMI. Head: Atraumatic. Nose: No congestion/rhinnorhea. Mouth/Throat: Mucous membranes are moist.  Oropharynx non-erythematous. Neck: No stridor. No cervical spine tenderness to palpation. Cardiovascular: Normal rate, regular rhythm. Grossly normal heart sounds.  Good peripheral circulation. Respiratory: Normal respiratory effort.  No retractions. Lungs CTAB. Gastrointestinal: Soft , nondistended, nontender to palpation. No CVA tenderness. Musculoskeletal: Pitting edema to bilateral lower extremities symmetrically without overlying skin changes, signs of trauma. Neurologic:  Normal speech and language. No gross focal neurologic deficits are appreciated. No gait instability noted. Skin:  Skin is warm, dry and intact. No rash noted. Psychiatric: Mood and affect are normal. Speech and behavior are  normal.  ____________________________________________   LABS (all labs ordered are listed, but only abnormal results are displayed)  Labs Reviewed  BASIC METABOLIC PANEL - Abnormal; Notable for the following components:      Result Value   Sodium 134 (*)    CO2 17 (*)    Glucose, Bld 118 (*)    Creatinine, Ser 1.79 (*)    Calcium 8.6 (*)    GFR, Estimated 31 (*)    All other components within normal limits  CBC WITH DIFFERENTIAL/PLATELET - Abnormal; Notable for the following components:   Hemoglobin 10.6 (*)    HCT 33.2 (*)    RDW 16.9 (*)    Platelets 120 (*)    All other components within normal limits  BRAIN NATRIURETIC PEPTIDE - Abnormal; Notable for the following components:   B Natriuretic Peptide 514.6 (*)    All other components within normal limits   ____________________________________________  12 Lead EKG  Sinus rhythm, rate of 64 bpm.  Normal axis and intervals.  No evidence of acute ischemia. ____________________________________________  RADIOLOGY  ED MD interpretation: 2 view CXR reviewed by me without evidence of acute cardiopulmonary pathology.  Official radiology report(s): DG Chest 2 View  Result Date: 12/03/2020 CLINICAL DATA:  CHF, fluid retention EXAM: CHEST - 2 VIEW COMPARISON:  the previous day's study FINDINGS: Coarse bibasilar interstitial markings. Stable linear nodular thickening in the right middle lobe. Heart size and mediastinal contours are within normal limits. Aortic Atherosclerosis (ICD10-170.0). No effusion.  No pneumothorax. Visualized bones unremarkable. Embolization coils in the epigastrium. IMPRESSION: No acute cardiopulmonary disease. Electronically Signed   By: Lucrezia Europe M.D.   On: 12/03/2020 15:50    ____________________________________________   PROCEDURES and INTERVENTIONS  Procedure(s) performed (including Critical Care):  .1-3 Lead EKG Interpretation Performed by: Vladimir Crofts, MD Authorized by: Vladimir Crofts, MD      Interpretation: normal     ECG rate:  75   ECG rate assessment: normal     Rhythm: sinus rhythm     Ectopy: none     Conduction: normal      Medications  furosemide (LASIX) injection 60 mg (60 mg Intravenous Given 12/03/20 2234)    ____________________________________________   MDM / ED COURSE   Obese 70 year old woman with known diastolic CHF presents to the ED with acute on chronic increased swelling, with evidence of mild CHF exacerbation, amenable to outpatient management.  Normal vitals on room air.  Exam with evidence of lower extremity edema and volume overload, but no distress, neurovascular deficits or signs of trauma.  CXR is clear.  Blood work with elevated BNP.  We will provide IV Lasix and educate on conservative measures to improve her lower extremity swelling and associated aching discomfort.  I see no  evidence of acute pathology to preclude outpatient management.  We discussed following up with PCP, cardiology and discussed return precautions for the ED.     ____________________________________________   FINAL CLINICAL IMPRESSION(S) / ED DIAGNOSES  Final diagnoses:  Acute on chronic diastolic congestive heart failure ([place])  Other hypervolemia     ED Discharge Orders    None       Normalee Sistare   Note:  This document was prepared using Set designer software and may include unintentional dictation errors.   Vladimir Crofts, MD 12/03/20 [phone]

## 2020-12-03 NOTE — ED Notes (Signed)
Pt called in the [facility] with no response, pt is not visualized, will attempt again in 5 min

## 2020-12-03 NOTE — Telephone Encounter (Signed)
Patient states she will go back to the ER and she will not leave till they tell her she can.

## 2020-12-03 NOTE — Telephone Encounter (Signed)
Patient request a return phone call, has many questions about how she should proceed.  Had hospital visit yesterday

## 2020-12-03 NOTE — Telephone Encounter (Signed)
Patient calling back with update. She went to ed for labs and chest xray.  Please call to discuss findings.  Patient states she couldn't stay at the ED it was too cold

## 2020-12-03 NOTE — Telephone Encounter (Signed)
Spoke to patient and advised her to go back to the ER as advised by the GI doctor as well. Patient verbalized understanding and I encouraged her to stay because she needs to get evaluated and possibly admitted.

## 2020-12-06 ENCOUNTER — Telehealth: Payer: Self-pay | Admitting: Gastroenterology

## 2020-12-06 NOTE — Telephone Encounter (Signed)
Patient needs a refill on Lactulose called into [facility].  Patient requests we call in more than 3 bottles

## 2020-12-06 NOTE — Telephone Encounter (Signed)
Last office visit 07/13/2020 decompensated hepatic cirrhosis  Last refill 12/02/20 2 refills  This medication is to soon to be refilled. Patient needs to call the pharmacy should have one ready pick up. She will call the pharmacy and to get it refilled   Patient states she went to the ER and set for 10 hours. They gave her one IV of Lasix and sent her home

## 2020-12-06 NOTE — Telephone Encounter (Signed)
Patient verbalized understanding and made appointment for tomorrow

## 2020-12-06 NOTE — Telephone Encounter (Signed)
I would like to see her in office tomorrow if she can come [facility] with lactulose as requested  RV

## 2020-12-07 ENCOUNTER — Ambulatory Visit (INDEPENDENT_AMBULATORY_CARE_PROVIDER_SITE_OTHER): Payer: Medicare HMO | Admitting: Gastroenterology

## 2020-12-07 ENCOUNTER — Encounter: Payer: Self-pay | Admitting: Gastroenterology

## 2020-12-07 ENCOUNTER — Other Ambulatory Visit: Payer: Self-pay

## 2020-12-07 ENCOUNTER — Inpatient Hospital Stay: Payer: Medicaid Other

## 2020-12-07 VITALS — BP 143/75 | HR 71 | Temp 97.6°F | Ht 63.0 in | Wt 205.1 lb

## 2020-12-07 DIAGNOSIS — M25474 Effusion, right foot: Secondary | ICD-10-CM

## 2020-12-07 DIAGNOSIS — R609 Edema, unspecified: Secondary | ICD-10-CM

## 2020-12-07 DIAGNOSIS — K7290 Hepatic failure, unspecified without coma: Secondary | ICD-10-CM

## 2020-12-07 DIAGNOSIS — M25475 Effusion, left foot: Secondary | ICD-10-CM | POA: Diagnosis not present

## 2020-12-07 DIAGNOSIS — M25471 Effusion, right ankle: Secondary | ICD-10-CM

## 2020-12-07 DIAGNOSIS — M25472 Effusion, left ankle: Secondary | ICD-10-CM | POA: Diagnosis not present

## 2020-12-07 DIAGNOSIS — R635 Abnormal weight gain: Secondary | ICD-10-CM

## 2020-12-07 DIAGNOSIS — K7460 Unspecified cirrhosis of liver: Secondary | ICD-10-CM | POA: Diagnosis not present

## 2020-12-07 MED ORDER — SPIRONOLACTONE 100 MG PO TABS: 100.0000 mg | ORAL_TABLET | Freq: Two times a day (BID) | ORAL | 0 refills | Status: DC

## 2020-12-07 MED ORDER — FUROSEMIDE 40 MG PO TABS: 40.0000 mg | ORAL_TABLET | Freq: Two times a day (BID) | ORAL | 0 refills | Status: DC

## 2020-12-07 NOTE — Progress Notes (Signed)
[name], MD [address]  [place]  [place], [place] [address]  Main: [phone]  Fax: [phone]    Gastroenterology Consultation  Referring Provider:     Martin Majestic, * Primary Care Physician:  Martin Majestic, FNP Primary Gastroenterologist:  Dr. [name] Reason for Consultation:     Decompensated cirrhosis, worsening of swelling of legs        HPI:   Crystal [name] is a 70 y.o. female referred by Dr. Martin Majestic, FNP  for consultation & management of decompensated cirrhosis.   Follow-up visit 06/26/2019 Patient was told to make a follow-up to see me to discuss about abnormal labs by her PCP.  I reviewed the labs from 06/12/2019 which revealed normal hemoglobin, platelets 110, normal BUN/creatinine, total bilirubin 2, alkaline phosphatase 170.  She had repeat labs at Dr. Pasty Arch office in [place] on 8/3, alkaline phosphatase 173, AST 60, total bilirubin 1.5, creatinine 1.18.  Patient reports she has been doing very well.  She denies any swelling of legs, dark urine, pruritus, abdominal pain, melena.  She feels she has remained the same from liver standpoint.  She is requesting lactulose for 3 months supply  She is single, lives with a roommate  Follow-up visit 05/03/2020 Patient was recently admitted to [facility] on 6/12 secondary to increasing fatigue.  Her hemoglobin A1c was low and she had hypoglycemic episode.  Therefore, antidiabetic medications were discontinued.  Her hemoglobin was 8.1 during that admission.  Patient is here for follow-up of decompensated cirrhosis.  She reports increasing fatigue.  She says she has not been able to perform her daily activities like she used to do before.  She reports mild swelling of her feet.  She denies any weight gain.She denies fever, chills, abdominal pain, nausea or vomiting, hematemesis, rectal bleeding, hematochezia.  She denies black stools or melena.  She underwent upper endoscopy which  revealed gastric AVMs that were cauterized.  Subsequently, underwent video capsule endoscopy which was unremarkable.  She does have progressively worsening anemia without active GI bleed. She is compliant with her diuretics, lactulose, rifaximin.  She is also noted to have declining renal function. She does report nocturnal pruritus for which she takes hydroxyzine 10 mg every 5 hours at night  Follow-up visit 06/01/2020 Patient made an urgent visit secondary to volume overload within last 4 weeks.  She gained more than 20 pounds in last few weeks.  She noticed significant swelling of legs extending to upper thighs as well as exertional dyspnea.  She reports that she has been taking diuretics however her renal function was gradually getting worse.  Therefore I held diuretics.  She also acknowledges that she eats out almost daily.  She does eat hamburger with pickle on a regular basis as well.  She went to ER last week.  Labs revealed potassium 3.4, slightly improved creatinine 1.6 from 1.7, normal BUN, hemoglobin 8.6 which is at baseline, elevated BNP 464.9, TSH normal She denies abdominal pain, fever, chills, nausea or vomiting.  Diuretics were restarted, Lasix increased to 60 mg daily along with spironolactone 50 mg daily  Follow-up visit 07/13/2020 Patient continues to lose weight on current dose of diuretics.  She is maintaining low-sodium diet.  She feels loopy she ran out of lactulose.  She is currently on rifaximin twice daily.  Her hemoglobin is also downtrending with worsening iron deficiency.  Latest hemoglobin is 8.1, received IV iron last Wednesday and scheduled for second dose next  week.  She denies black stools.  Does report her stools alternating between yellow and dark.  Follow-up visit 12/07/2020 Patient reports significant weight gain in last several weeks, she gained about 15 pounds with severe swelling of both legs extending all the way to the lower abdomen.  She reports that she has been  taking Lasix 80 mg and spironolactone 100 mg.  Patient went to ER twice secondary to significant pedal edema, labs were fairly unremarkable, she was given IV Lasix 60 mg x 1 dose in the ER.  Patient reports that this has resulted in more urine output.  However, she does not notice a big change on Lasix 80 mg.  She has been taking lactulose.  She denies any abdominal pain, nausea, vomiting, fever.  NSAIDs: none including BC powder or Goody powder  Antiplts/Anticoagulants/Anti thrombotics: none  GI Procedures:  Video capsule endoscopy 03/29/2020 Capsule study results:  Non specific no bleeding tiny erythematous spots in small bowel. If patient develops worse anemia recommend a push enteroscopy   EGD 03/08/2020 - A few non-bleeding angioectasias in the duodenum. Treated with argon plasma coagulation (APC). - Gastric antral vascular ectasia with bleeding. Treated with argon plasma coagulation (APC). - A single non-bleeding angioectasia in the stomach. Treated with bipolar cautery. - Normal gastroesophageal junction and esophagus. - No specimens collected.  EGD 11/19/2018 - Normal duodenal bulb and second portion of the duodenum. - Gastric antral vascular ectasia with bleeding. Treated with argon plasma coagulation (APC). - Normal gastroesophageal junction and esophagus. - No specimens collected.  EGD 08/03/2018 - Normal duodenal bulb and second portion of the duodenum. - Portal hypertensive gastropathy. - Bleeding large (> 5 mm) esophageal varices, ligation was unsuccessful. - No specimens collected.  EGD 04/22/2018 - Normal duodenal bulb and second portion of the duodenum. - Non-bleeding gastric ulcer with a clean ulcer base (Forrest Class III). - Portal hypertensive gastropathy. - Large (> 5 mm) esophageal varices. Incompletely eradicated. Banded x 2. - No specimens collected.  EGD 03/25/2018 - Two non-bleeding angioectasias in the duodenum. Treated with argon plasma coagulation  (APC). - A few non-bleeding angioectasias in the stomach. Treated with argon plasma coagulation (APC). - Non-bleeding gastric ulcers with a clean ulcer base (Forrest Class III). - Non-bleeding large (> 5 mm) esophageal varices. Incompletely eradicated. Banded. - No specimens collected.   Colonoscopy 03/25/2018 - One 5 mm polyp in the ascending colon, removed with a cold snare. Resected and retrieved. - Rectal varices. - External hemorrhoids. - The examination was otherwise normal.  DIAGNOSIS:  A. COLON POLYP, ASCENDING; COLD SNARE:  - TUBULAR ADENOMA.  - NEGATIVE FOR HIGH-GRADE DYSPLASIA AND MALIGNANCY.  EGD 02/09/18 The examined duodenum was normal. The stomach was normal. Five columns of non-bleeding grade III varices were found in the lower third of the esophagus,. Stigmata of recent bleeding were evident and red wale signs were present. Ten bands were successfully placed with incomplete eradication of varices. There was no bleeding during, and at the end, of the procedure.  Past Medical History:  Diagnosis Date  . Acute GI bleeding 02/09/2018  . Acute upper gastrointestinal bleeding 08/03/2018  . Anemia    TAKES IRON TAB  . Arthritis    SHOULDER  . B12 deficiency 02/18/2018  . Bleeding    GI  3/19  . Bronchitis    HX OF  . CHF (congestive heart failure) ([place])   . Cirrhosis ([place])   . Colon cancer screening   . Diabetes mellitus without complication ([place])  TYPE 2  . Fatty (change of) liver, not elsewhere classified   . Full dentures    UPPER AND LOWER  . Iron deficiency anemia due to chronic blood loss 02/18/2018  . Lung nodule, multiple   . Pernicious anemia 02/22/2018  . PUD (peptic ulcer disease)   . Raynaud's syndrome   . Upper GI bleeding 08/03/2018    Past Surgical History:  Procedure Laterality Date  . CATARACT EXTRACTION W/PHACO Right 02/06/2018   Procedure: CATARACT EXTRACTION PHACO AND INTRAOCULAR LENS PLACEMENT ([place]) RIGHT DIABETIC;  Surgeon: Leandrew Koyanagi, MD;  Location: [facility];  Service: Ophthalmology;  Laterality: Right;  DIABETIC, ORAL MED  . CATARACT EXTRACTION W/PHACO Left 04/30/2018   Procedure: CATARACT EXTRACTION PHACO AND INTRAOCULAR LENS PLACEMENT (I[facility]);  Surgeon: Leandrew Koyanagi, MD;  Location: [facility];  Service: Ophthalmology;  Laterality: Left;  [place] 01:04 AP% 21.3 CDE 13.66 Fluid pack lot # [id number]  . COLONOSCOPY WITH PROPOFOL N/A 03/25/2018   Procedure: COLONOSCOPY WITH PROPOFOL;  Surgeon: Lin Landsman, MD;  Location: [facility] ENDOSCOPY;  Service: Gastroenterology;  Laterality: N/A;  . DENTAL SURGERY     EXTRACTIONS  . ELECTROMAGNETIC NAVIGATION BROCHOSCOPY Right 01/27/2019   Procedure: ELECTROMAGNETIC NAVIGATION BRONCHOSCOPY;  Surgeon: [name], MD;  Location: [facility];  Service: Cardiopulmonary;  Laterality: Right;  . ENTEROSCOPY N/A 07/28/2020   Procedure: Push ENTEROSCOPY;  Surgeon: Lin Landsman, MD;  Location: [facility] ENDOSCOPY;  Service: Gastroenterology;  Laterality: N/A;  . ESOPHAGOGASTRODUODENOSCOPY N/A 08/03/2018   Procedure: ESOPHAGOGASTRODUODENOSCOPY (EGD);  Surgeon: Lin Landsman, MD;  Location: [facility] ENDOSCOPY;  Service: Gastroenterology;  Laterality: N/A;  . ESOPHAGOGASTRODUODENOSCOPY (EGD) WITH PROPOFOL N/A 02/10/2018   Procedure: ESOPHAGOGASTRODUODENOSCOPY (EGD) WITH PROPOFOL;  Surgeon: Jonathon Bellows, MD;  Location: [facility]OSCOPY;  Service: Gastroenterology;  Laterality: N/A;  . ESOPHAGOGASTRODUODENOSCOPY (EGD) WITH PROPOFOL N/A 03/25/2018   Procedure: ESOPHAGOGASTRODUODENOSCOPY (EGD) WITH PROPOFOL;  Surgeon: Lin Landsman, MD;  Location: [facility] ENDOSCOPY;  Service: Gastroenterology;  Laterality: N/A;  . ESOPHAGOGASTRODUODENOSCOPY (EGD) WITH PROPOFOL N/A 04/22/2018   Procedure: ESOPHAGOGASTRODUODENOSCOPY (EGD) WITH PROPOFOL with band ligation;  Surgeon: Lin Landsman, MD;  Location: [facility];  Service: Gastroenterology;  Laterality: N/A;  .  ESOPHAGOGASTRODUODENOSCOPY (EGD) WITH PROPOFOL N/A 06/24/2018   Procedure: ESOPHAGOGASTRODUODENOSCOPY (EGD) WITH PROPOFOL;  Surgeon: Lin Landsman, MD;  Location: [facility] ENDOSCOPY;  Service: Gastroenterology;  Laterality: N/A;  . ESOPHAGOGASTRODUODENOSCOPY (EGD) WITH PROPOFOL N/A 11/19/2018   Procedure: ESOPHAGOGASTRODUODENOSCOPY (EGD) WITH PROPOFOL;  Surgeon: Lin Landsman, MD;  Location: [facility] ENDOSCOPY;  Service: Gastroenterology;  Laterality: N/A;  . ESOPHAGOGASTRODUODENOSCOPY (EGD) WITH PROPOFOL N/A 03/08/2020   Procedure: ESOPHAGOGASTRODUODENOSCOPY (EGD) WITH PROPOFOL;  Surgeon: Lin Landsman, MD;  Location: [facility] ENDOSCOPY;  Service: Gastroenterology;  Laterality: N/A;  . EYE SURGERY    . GIVENS CAPSULE STUDY N/A 03/29/2020   Procedure: GIVENS CAPSULE STUDY;  Surgeon: Lin Landsman, MD;  Location: [facility] ENDOSCOPY;  Service: Gastroenterology;  Laterality: N/A;  . HEMORRHOID BANDING  03/25/2018   Procedure: HEMORRHOID BANDING;  Surgeon: Lin Landsman, MD;  Location: [facility] ENDOSCOPY;  Service: Gastroenterology;;  . IR EMBO ART  VEN HEMORR LYMPH EXTRAV  INC GUIDE ROADMAPPING  08/07/2018  . IR RADIOLOGIST EVAL & MGMT  09/03/2018  . IR RADIOLOGIST EVAL & MGMT  12/17/2018  . IR RADIOLOGIST EVAL & MGMT  06/24/2019  . IR RADIOLOGIST EVAL & MGMT  01/22/2020  . IR TIPS  08/07/2018  . PORTA CATH INSERTION N/A 07/30/2020   Procedure: PORTA CATH INSERTION;  Surgeon: Katha Cabal,  MD;  Location: [facility] CV LAB;  Service: Cardiovascular;  Laterality: N/A;  . RADIOLOGY WITH ANESTHESIA N/A 08/07/2018   Procedure: TIPS;  Surgeon: Corrie Mckusick, DO;  Location: [employer];  Service: Anesthesiology;  Laterality: N/A;  . TONSILLECTOMY       Current Outpatient Medications:  .  Cholecalciferol (VITAMIN D3) 1.25 MG (50000 UT) CAPS, Take 1 capsule by mouth once a week. , Disp: , Rfl:  .  cyanocobalamin (,VITAMIN B-12,) 1000 MCG/ML injection, Inject 1,000 mcg into the muscle every 30 (thirty)  days. , Disp: , Rfl:  .  furosemide (LASIX) 40 MG tablet, Take 1 tablet (40 mg total) by mouth 2 (two) times daily., Disp: 180 tablet, Rfl: 1 .  hydrOXYzine (ATARAX/VISTARIL) 25 MG tablet, Take 25 mg by mouth 3 (three) times daily., Disp: , Rfl:  .  lactulose (CHRONULAC) 10 GM/15ML solution, TAKE 15 MLS (10 G TOTAL) BY MOUTH 3 (THREE) TIMES DAILY., Disp: 1800 mL, Rfl: 2 .  Misc. Devices KIT, Moderate compression hose 20-30 MM HG, Disp: 1 kit, Rfl: 0 .  nystatin (MYCOSTATIN/NYSTOP) powder, Apply 1 g topically daily as needed (rash). , Disp: , Rfl:  .  ondansetron (ZOFRAN) 4 MG tablet, Take 4 mg by mouth every 8 (eight) hours as needed for nausea or vomiting. , Disp: , Rfl:  .  OneTouch Delica Lancets 63K MISC, 2 (two) times daily., Disp: , Rfl:  .  ONETOUCH ULTRA test strip, 2 (two) times daily., Disp: , Rfl:  .  Potassium Chloride Crys ER (KLOR-CON M10 PO), Take 15 mg by mouth daily., Disp: , Rfl:  .  spironolactone (ALDACTONE) 100 MG tablet, Take 1 tablet (100 mg total) by mouth daily., Disp: 30 tablet, Rfl: 2 .  XIFAXAN 550 MG TABS tablet, Take 550 mg by mouth 2 (two) times daily., Disp: , Rfl:  .  pantoprazole (PROTONIX) 40 MG tablet, Take 40 mg by mouth 2 (two) times daily. (Patient not taking: Reported on 12/07/2020), Disp: , Rfl:  No current facility-administered medications for this visit.  Facility-Administered Medications Ordered in Other Visits:  .  sodium chloride flush (NS) 0.9 % injection 10 mL, 10 mL, Intravenous, PRN, Mike Gip, Melissa C, MD, 10 mL at 09/14/20 1320   Family History  Problem Relation Age of Onset  . CAD Father   . Heart attack Father 95  . Cancer Maternal Uncle   . Seizures Mother      Social History   Tobacco Use  . Smoking status: Never Smoker  . Smokeless tobacco: Never Used  Vaping Use  . Vaping Use: Never used  Substance Use Topics  . Alcohol use: Not Currently    Comment: No EtOH for 30 years.  Never a heavy drinker.  . Drug use: Never     Allergies as of 12/07/2020  . (No Known Allergies)    Review of Systems:    All systems reviewed and negative except where noted in HPI.   Physical Exam:  BP (!) 143/75 (BP Location: Left Arm, Patient Position: Sitting, Cuff Size: Normal)   Pulse 71   Temp 97.6 F (36.4 C) (Oral)   Ht 5' 3" (1.6 m)   Wt 205 lb 2 oz (93 kg)   LMP  (LMP Unknown)   BMI 36.34 kg/m  No LMP recorded (lmp unknown). Patient is postmenopausal.  General:   Alert,  Well-developed, well-nourished, pleasant and cooperative in NAD, lethargic Head:  Normocephalic and atraumatic. Eyes:  Sclera clear, no icterus.   Conjunctiva  pink. Ears:  Normal auditory acuity. Nose:  No deformity, discharge, or lesions. Mouth:  No deformity or lesions,oropharynx pink & moist. Neck:  Supple; no masses or thyromegaly. Lungs:  Respirations even and unlabored.  Clear throughout to auscultation.   No wheezes, crackles, or rhonchi. No acute distress. Heart:  Regular rate and rhythm; no murmurs, clicks, rubs, or gallops. Abdomen:  Normal bowel sounds. Soft, non-tender and nondistended, without masses, hepatosplenomegaly or hernias noted.  No guarding or rebound tenderness.   Rectal: Not performed Msk:  Symmetrical without gross deformities. muscle wasting in bilateral upper extremities Good, equal movement & strength bilaterally. Pulses:  Normal pulses noted. Extremities:  No clubbing, 3+ edema, no cyanosis Neurologic:  Alert and oriented x3;  grossly normal neurologically. Skin: telangiectasias on upper anterior chest, the rash in her bilateral groin appears like a erythematous ring with central clearing, dry scaly margins Lymph Nodes:  No significant cervical adenopathy. Psych:  Alert and cooperative. Normal mood and affect.  Imaging Studies: Reviewed. CT A/P with contrast on 02/09/2018 revealed mildly enlarged retroperitoneal lymph nodes, largest with 11 mm concerning for metastatic disease or malignancy,18 mm spiculated  density in the right lower lobe concerning for malignancy associated with small left pleural effusion  Assessment and Plan:   Delma A Macari is a 70 y.o. Caucasian female with metabolic syndrome, with decompensated cirrhosis secondary to ascites and refractory variceal bleed from esophageal varices status post TIPS and coil embolization of the left gastric vein here for follow-up  Decompensated cirrhosis: child class B, MELD-Na 9 Secondary liver disease workup positive for HCV antibodies, but viral load not detected, anti-smooth muscle antibodies weakly positive. 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 Ceruloplasmin, antimitochondrial antibodies, hepatitis B, alpha-1 antitrypsin came back negative Portal hypertension: manifested as ascites, esophageal varices, splenomegaly.  Patient currently has no ascites since placement of TIPS, controlled on low-sodium diet S/p TIPS: Secondary to refractory variceal bleed. Follow-up with interventional radiology every 6 months.  TIPS patent Volume overload: No evidence of ascites, does have significant bilateral swelling of legs.  Patient was evaluated by nephrologist, Dr. Anthonette Legato for chronic kidney disease.  I recommended her to continue Lasix 80 mg and increase spironolactone to 200 mg daily, continue low-sodium diet and recheck BMP in 1 week.  Advised patient that she should follow-up with her nephrologist. Reiterated on low-sodium diet. Suggested her to check weight daily. Patient is also evaluated by cardiology, stable cardiac function, advised no further intervention at this time.  Ultrasound Dopplers revealed patent TIPS in 7/21.  She has appointment with nephrologist next month Varices: variceal bleed from esophageal varices, status post EGD on 02/10/2018, EVLx10, status post EGD 03/25/2018 EVLx4, s/p EGD 04/22/18 EVLx2 and hemospray, refractory variceal bleed 08/03/2018, status post TIPS with coil  embolization of left gastric vein on 08/07/2018.  Stopped propranolol  Repeat EGD in 11/2018 revealed no varices at all, GAVE treated with APC.   Pruritus: Continue hydroxyzine 10 mg 3 times a day as needed most likely secondary to cholestasis.  Stable alkaline phosphatase, increase hydroxyzine to 20 mg if needed  Anemia: Worsening, iron deficient secondary to intermittent chronic blood loss likely from combination of AVMs, portal hypertensive gastropathy, GAVE.  Video capsule endoscopy revealed possible small bowel AVMs.  Therefore, recommend push enteroscopy due to worsening anemia.  There is no evidence of coagulopathy.  She has mild thrombocytopenia HCC screening: AFP normal, no liver lesions on CT in 07/2018.  Repeat ultrasound in  05/2019 with no liver lesions. MRI liver mass protocol with MRCP in 10/2019 was unremarkable.  Ultrasound liver Doppler in 05/2020 normal.  PSE: Started on lactulose post TIPS.  Continue rifaximin 550 mg twice daily, long-term.  Chronic kidney disease: Does have declining renal function since 01/2020, GFR between 30 and 40, follow-up with nephrology Hepatic hydrothorax: none   Health maintenance:  She is immune to hepatitis B She received Pneumovax and influenza vaccine in 07/2018 vitamin D levels 29.2 on 03/15/2018 Avoid excess Tylenol and NSAIDs use Colon cancer screening:underwent colonoscopy on 04/11/2018, One 5 mm polyp in the ascending colon, removed with a cold snare, Patch showed tubular adenoma with no evidence of high-grade dysplasia or malignancy. Recommend repeat colonoscopy in 5 years Right lower lobe lung lesion and retroperitoneal lymphadenopathy: follow-up with Dr. Mike Gip, no evidence of retroperitoneal lymphadenopathy. Repeat CT chest revealed that the lesions were stable.  Patient underwent bronchoscopy, sample was limited, no evidence of malignancy.  Plan to monitor the lesion with imaging for now  Follow up in 4 to 6 weeks  [name], MD

## 2020-12-07 NOTE — Patient Instructions (Addendum)
1. Please increase the Lasix to 3m twice a day and Spironolactone 1052mtwice a day.   2. Please get lab work done Monday or Tuesday  3.follow up 4 weeks  4.our lab is open on Mondays 1pm to 4pm, Tuesdays 8:30am to 4pm. Wednesdays 8:30am to 4pm and thursdays 1pm to 4pm.    Please call our office to speak with my nurse AsCaryl [phone] business hours from 8am to 4pm if you have any questions/concerns. During after hours, you will be redirected to on call GI physician. For any emergency please call 911 or go the nearest emergency room.    RoCephas DarbyMD [address] [id number]: [phone]: [phone]

## 2020-12-08 ENCOUNTER — Telehealth: Payer: Self-pay | Admitting: *Deleted

## 2020-12-08 ENCOUNTER — Encounter: Payer: Self-pay | Admitting: Internal Medicine

## 2020-12-08 ENCOUNTER — Ambulatory Visit (INDEPENDENT_AMBULATORY_CARE_PROVIDER_SITE_OTHER): Payer: Medicare HMO | Admitting: Internal Medicine

## 2020-12-08 VITALS — BP 120/60 | HR 56 | Ht 62.0 in | Wt 206.1 lb

## 2020-12-08 DIAGNOSIS — R635 Abnormal weight gain: Secondary | ICD-10-CM

## 2020-12-08 DIAGNOSIS — I5033 Acute on chronic diastolic (congestive) heart failure: Secondary | ICD-10-CM

## 2020-12-08 DIAGNOSIS — K7460 Unspecified cirrhosis of liver: Secondary | ICD-10-CM

## 2020-12-08 DIAGNOSIS — M25474 Effusion, right foot: Secondary | ICD-10-CM

## 2020-12-08 DIAGNOSIS — R609 Edema, unspecified: Secondary | ICD-10-CM | POA: Diagnosis not present

## 2020-12-08 DIAGNOSIS — M25475 Effusion, left foot: Secondary | ICD-10-CM

## 2020-12-08 DIAGNOSIS — M25471 Effusion, right ankle: Secondary | ICD-10-CM

## 2020-12-08 DIAGNOSIS — I5043 Acute on chronic combined systolic (congestive) and diastolic (congestive) heart failure: Secondary | ICD-10-CM

## 2020-12-08 DIAGNOSIS — I5032 Chronic diastolic (congestive) heart failure: Secondary | ICD-10-CM

## 2020-12-08 MED ORDER — FUROSEMIDE 40 MG PO TABS: 80.0000 mg | ORAL_TABLET | Freq: Two times a day (BID) | ORAL | 0 refills | Status: DC

## 2020-12-08 MED ORDER — FUROSEMIDE 80 MG PO TABS: 80.0000 mg | ORAL_TABLET | Freq: Two times a day (BID) | ORAL | 1 refills | Status: DC

## 2020-12-08 NOTE — Telephone Encounter (Signed)
Received a call from Dr Darnelle Bos office Crystal [name] asking if we could give patient IV Lasix tomorrow when she comes in for her B 12 injection. Please let her know if we can either by call or secure chat

## 2020-12-08 NOTE — Patient Instructions (Signed)
I will call you in the morning to let you know the plan about the IV lasix. Whether you can get at the Cancer center or not.    Medication Instructions:  Your physician has recommended you make the following change in your medication:  1- INCREASE Furosemide to 80 mg (2 tablets) by mouth two times a day.  *If you need a refill on your cardiac medications before your next appointment, please call your pharmacy*  Lab Work: Your physician recommends that you return for lab work in: 1 week prior to coming down to the appointment for STAT BMET.  - Please go to the [facility]. You will check in at the front desk to the right as you walk into the atrium. Valet Parking is offered if needed. - No appointment needed. You may go any day between 7 am and 6 pm.  If you have labs (blood work) drawn today and your tests are completely normal, you will receive your results only by: Marland Kitchen MyChart Message (if you have MyChart) OR . A paper copy in the mail If you have any lab test that is abnormal or we need to change your treatment, we will call you to review the results.   Testing/Procedures: Your physician has requested that you have an echocardiogram. Echocardiography is a painless test that uses sound waves to create images of your heart. It provides your doctor with information about the size and shape of your heart and how well your heart's chambers and valves are working. This procedure takes approximately one hour. There are no restrictions for this procedure. There is a possibility that an IV may need to be started during your test to inject an image enhancing agent. This is done to obtain more optimal pictures of your heart. Therefore we ask that you do at least drink some water prior to coming in to hydrate your veins.   Follow-Up: At [facility], you and your health needs are our priority.  As part of our continuing mission to provide you with exceptional heart care, we have created  designated Provider Care Teams.  These Care Teams include your primary Cardiologist (physician) and Advanced Practice Providers (APPs -  Physician Assistants and Nurse Practitioners) who all work together to provide you with the care you need, when you need it.  We recommend signing up for the patient portal called "MyChart".  Sign up information is provided on this After Visit Summary.  MyChart is used to connect with patients for Virtual Visits (Telemedicine).  Patients are able to view lab/test results, encounter notes, upcoming appointments, etc.  Non-urgent messages can be sent to your provider as well.   To learn more about what you can do with MyChart, go to [email or web address].    Your next appointment:   1 week(s) with STAT BMET at [facility] prior to coming down to the office.   The format for your next appointment:   In Person  Provider:   You may see Nelva Bush, MD or one of the following Advanced Practice Providers on your designated Care Team:    Murray Hodgkins, NP  Christell Faith, PA-C  Marrianne Mood, PA-C  Cadence Canastota, [place]  [name], NP

## 2020-12-08 NOTE — Telephone Encounter (Signed)
Patient seen in clinic today. She had B12 injection via her port tomorrow at the [facility] in [place]. I called [place] to see if they could give patient IV lasix 80 mg tomorrow at her appointment.  No answer. Left message to call me back to further discuss.

## 2020-12-08 NOTE — Progress Notes (Unsigned)
Follow-up Outpatient Visit Date: 12/08/2020  Primary Care Provider: Martin Majestic, FNP [place] [id number]  Chief Complaint: Swelling and shortness of breath  HPI:  Crystal [name] is a 70 y.o. female with history of chronic HFpEF, cirrhosis complicated by esophageal varices status post TIPS, peptic ulcer disease, type 2 diabetes mellitus, pulmonary nodules, iron deficiency anemia, and vitamin B12 deficiency, who presents for evaluation of worsening edema and shortness of breath.  Crystal [name] reports that she was in her usual state of health until mid January when she began to notice a boil on her right calf.  She was seen in urgent care on 11/23/2020.  She was given prescriptions for cephalexin and doxycycline.  She returned 2 days later complaining of continued though somewhat improved discomfort in the right calf.  No further intervention was undertaken.  Within a couple of days, Crystal [name] began to notice marked swelling of her legs and abdomen accompanied by worsening exertional dyspnea.  She now feels like her legs are about to pop.  There were no other medication changes.  She has not changed her sodium intake.  She was seen in the ED 6 days ago and was diagnosed with acute HFpEF.  She was given furosemide 60 mg IV x1 and discharged home.  She subsequently saw her gastroenterologist yesterday, who recommended increasing spironolactone to 100 mg twice daily.  Today, Crystal [name] reports that her swelling has not improved at all.  She denies chest pain, palpitations, and lightheadedness.  She has been compliant with her medications.  She is scheduled for B12 injection and flushing of her port tomorrow at the cancer center in [facility].  --------------------------------------------------------------------------------------------------  Past Medical History:  Diagnosis Date  . Acute GI bleeding 02/09/2018  . Acute upper gastrointestinal bleeding 08/03/2018  . Anemia    TAKES IRON TAB  .  Arthritis    SHOULDER  . B12 deficiency 02/18/2018  . Bleeding    GI  3/19  . Bronchitis    HX OF  . CHF (congestive heart failure) ([place])   . Cirrhosis ([place])   . Colon cancer screening   . Diabetes mellitus without complication ([place])    TYPE 2  . Fatty (change of) liver, not elsewhere classified   . Full dentures    UPPER AND LOWER  . Iron deficiency anemia due to chronic blood loss 02/18/2018  . Lung nodule, multiple   . Pernicious anemia 02/22/2018  . PUD (peptic ulcer disease)   . Raynaud's syndrome   . Upper GI bleeding 08/03/2018   Past Surgical History:  Procedure Laterality Date  . CATARACT EXTRACTION W/PHACO Right 02/06/2018   Procedure: CATARACT EXTRACTION PHACO AND INTRAOCULAR LENS PLACEMENT ([place]) RIGHT DIABETIC;  Surgeon: Leandrew Koyanagi, MD;  Location: [place];  Service: Ophthalmology;  Laterality: Right;  DIABETIC, ORAL MED  . CATARACT EXTRACTION W/PHACO Left 04/30/2018   Procedure: CATARACT EXTRACTION PHACO AND INTRAOCULAR LENS PLACEMENT (IOC);  Surgeon: Leandrew Koyanagi, MD;  Location: [facility] ORS;  Service: Ophthalmology;  Laterality: Left;  [place] 01:04 AP% 21.3 CDE 13.66 Fluid pack lot # [id number]  . COLONOSCOPY WITH PROPOFOL N/A 03/25/2018   Procedure: COLONOSCOPY WITH PROPOFOL;  Surgeon: Lin Landsman, MD;  Location[facility] ENDOSCOPY;  Service: Gastroenterology;  Laterality: N/A;  . DENTAL SURGERY     EXTRACTIONS  . ELECTROMAGNETIC NAVIGATION BROCHOSCOPY Right 01/27/2019   Procedure: ELECTROMAGNETIC NAVIGATION BRONCHOSCOPY;  Surgeon: [name], MD;  Location: [facility];  Service: Cardiopulmonary;  Laterality: Right;  .  ENTEROSCOPY N/A 07/28/2020   Procedure: Push ENTEROSCOPY;  Surgeon: Lin Landsman, MD;  Location: [facility] ENDOSCOPY;  Service: Gastroenterology;  Laterality: N/A;  . ESOPHAGOGASTRODUODENOSCOPY N/A 08/03/2018   Procedure: ESOPHAGOGASTRODUODENOSCOPY (EGD);  Surgeon: Lin Landsman, MD;  Location: [facility] END[facility]COPY;  Service:  Gastroenterology;  Laterality: N/A;  . ESOPHAGOGASTRODUODENOSCOPY (EGD) WITH PROPOFOL N/A 02/10/2018   Procedure: ESOPHAGOGASTRODUODENOSCOPY (EGD) WITH PROPOFOL;  Surgeon: Jonathon Bellows, MD;  Location: [facility] ENDOSCOPY;  Service: Gastroenterology;  Laterality: N/A;  . ESOPHAGOGASTRODUODENOSCOPY (EGD) WITH PROPOFOL N/A 03/25/2018   Procedure: ESOPHAGOGASTRODUODENOSCOPY (EGD) WITH PROPOFOL;  Surgeon: Lin Landsman, MD;  Location: [facility] ENDOSCOPY;  Service: Gastroenterology;  Laterality: N/A;  . ESOPHAGOGASTRODUODENOSCOPY (EGD) WITH PROPOFOL N/A 04/22/2018   Procedure: ESOPHAGOGASTRODUODENOSCOPY (EGD) WITH PROPOFOL with band ligation;  Surgeon: Lin Landsman, MD;  Location: [place];  Service: Gastroenterology;  Laterality: N/A;  . ESOPHAGOGASTRODUODENOSCOPY (EGD) WITH PROPOFOL N/A 06/24/2018   Procedure: ESOPHAGOGASTRODUODENOSCOPY (EGD) WITH PROPOFOL;  Surgeon: Lin Landsman, MD;  Location: [facility] ENDOSCOPY;  Service: Gastroenterology;  Laterality: N/A;  . ESOPHAGOGASTRODUODENOSCOPY (EGD) WITH PROPOFOL N/A 11/19/2018   Procedure: ESOPHAGOGASTRODUODENOSCOPY (EGD) WITH PROPOFOL;  Surgeon: Lin Landsman, MD;  Location: [facility] ENDOSCOPY;  Service: Gastroenterology;  Laterality: N/A;  . ESOPHAGOGASTRODUODENOSCOPY (EGD) WITH PROPOFOL N/A 03/08/2020   Procedure: ESOPHAGOGASTRODUODENOSCOPY (EGD) WITH PROPOFOL;  Surgeon: Lin Landsman, MD;  Location: [facility] ENDOSCOPY;  Service: Gastroenterology;  Laterality: N/A;  . EYE SURGERY    . GIVENS CAPSULE STUDY N/A 03/29/2020   Procedure: GIVENS CAPSULE STUDY;  Surgeon: Lin Landsman, MD;  Location: [facility] ENDOSCOPY;  Service: Gastroenterology;  Laterality: N/A;  . HEMORRHOID BANDING  03/25/2018   Procedure: HEMORRHOID BANDING;  Surgeon: Lin Landsman, MD;  Location: [facility] ENDOSCOPY;  Service: Gastroenterology;;  . IR EMBO ART  VEN HEMORR LYMPH EXTRAV  INC GUIDE ROADMAPPING  08/07/2018  . IR RADIOLOGIST EVAL & MGMT  09/03/2018  . IR  RADIOLOGIST EVAL & MGMT  12/17/2018  . IR RADIOLOGIST EVAL & MGMT  06/24/2019  . IR RADIOLOGIST EVAL & MGMT  01/22/2020  . IR TIPS  08/07/2018  . PORTA CATH INSERTION N/A 07/30/2020   Procedure: PORTA CATH INSERTION;  Surgeon: Katha Cabal, MD;  Location: [place] [facility];  Service: Cardiovascular;  Laterality: N/A;  . RADIOLOGY WITH ANESTHESIA N/A 08/07/2018   Procedure: TIPS;  Surgeon: Corrie Mckusick, DO;  Location: [place];  Service: Anesthesiology;  Laterality: N/A;  . TONSILLECTOMY      Recent CV Pertinent Labs: Lab Results  Component Value Date   TRIG 163 (H) 08/03/2018   INR 1.1 06/16/2019   BNP 514.6 (H) 12/03/2020   K 4.1 12/03/2020   MG 1.8 08/21/2019   BUN 22 12/03/2020   BUN 18 06/08/2020   CREATININE 1.79 (H) 12/03/2020   CREATININE 1.18 (H) 06/16/2019    Past medical and surgical history were reviewed and updated in EPIC.  Current Meds  Medication Sig  . Cholecalciferol (VITAMIN D3) 1.25 MG (50000 UT) CAPS Take 1 capsule by mouth once a week.   . cyanocobalamin (,VITAMIN B-12,) 1000 MCG/ML injection Inject 1,000 mcg into the muscle every 30 (thirty) days.   . furosemide (LASIX) 80 MG tablet Take 1 tablet (80 mg total) by mouth 2 (two) times daily. (Patient taking differently: Take 40 mg by mouth 2 (two) times daily.)  . hydrOXYzine (ATARAX/VISTARIL) 25 MG tablet Take 25 mg by mouth 3 (three) times daily.  Marland Kitchen lactulose (CHRONULAC) 10 GM/15ML solution TAKE 15 MLS (10 G TOTAL) BY  MOUTH 3 (THREE) TIMES DAILY.  . Misc. Devices KIT Moderate compression hose 20-30 MM HG  . nystatin (MYCOSTATIN/NYSTOP) powder Apply 1 g topically daily as needed (rash).   . ondansetron (ZOFRAN) 4 MG tablet Take 4 mg by mouth every 8 (eight) hours as needed for nausea or vomiting.   Glory Rosebush Delica Lancets 00T MISC 2 (two) times daily.  Glory Rosebush ULTRA test strip 2 (two) times daily.  . pantoprazole (PROTONIX) 40 MG tablet Take 40 mg by mouth 2 (two) times daily.  . Potassium Chloride  Crys ER (KLOR-CON M10 PO) Take 15 mg by mouth daily.  Marland Kitchen spironolactone (ALDACTONE) 100 MG tablet Take 1 tablet (100 mg total) by mouth 2 (two) times daily.  Marland Kitchen XIFAXAN 550 MG TABS tablet Take 550 mg by mouth 2 (two) times daily.    Allergies: Patient has no known allergies.  Social History   Tobacco Use  . Smoking status: Never Smoker  . Smokeless tobacco: Never Used  Vaping Use  . Vaping Use: Never used  Substance Use Topics  . Alcohol use: Not Currently    Comment: No EtOH for 30 years.  Never a heavy drinker.  . Drug use: Never    Family History  Problem Relation Age of Onset  . CAD Father   . Heart attack Father 45  . Cancer Maternal Uncle   . Seizures Mother     Review of Systems: A 12-system review of systems was performed and was negative except as noted in the HPI.  --------------------------------------------------------------------------------------------------  Physical Exam: BP 120/60 (BP Location: Left Arm, Patient Position: Sitting, Cuff Size: Large)   Pulse (!) 56   Ht 5' 2" (1.575 m)   Wt 206 lb 2 oz (93.5 kg)   LMP  (LMP Unknown)   SpO2 97%   BMI 37.70 kg/m    ReDS Vest: 39%  General:  NAD. Neck: JVP difficult to assess due to body habitus. Lungs: Clear to auscultation bilaterally without wheezes or crackles. Heart: Bradycardic but regular with 1/6 systolic murmur.  No rubs or gallops. Abdomen: Obese and firm, likely mildly distended. Extremities: 2-3+ firm edema in both calves traveling up to the mid thighs.  EKG: Sinus bradycardia with low voltage and poor R wave progression.  No significant change from prior tracing on 12/03/2020.  Lab Results  Component Value Date   WBC 4.8 12/03/2020   HGB 10.6 (L) 12/03/2020   HCT 33.2 (L) 12/03/2020   MCV 82.2 12/03/2020   PLT 120 (L) 12/03/2020    Lab Results  Component Value Date   NA 134 (L) 12/03/2020   K 4.1 12/03/2020   CL 103 12/03/2020   CO2 17 (L) 12/03/2020   BUN 22 12/03/2020    CREATININE 1.79 (H) 12/03/2020   GLUCOSE 118 (H) 12/03/2020   ALT 16 04/22/2020    Lab Results  Component Value Date   TRIG 163 (H) 08/03/2018    --------------------------------------------------------------------------------------------------  ASSESSMENT AND PLAN: Edema with HFpEF and cirrhosis: Ms. Vavra has significant edema that seems to have developed fairly rapidly over the last 2 weeks.  Notably, her ReDS Vest reading today is only mildly elevated at 39%.  Other than being placed on doxycycline and cephalexin for right calf cellulitis, there were no medication or lifestyle changes to explain this precipitous fluid retention.  She was seen by Dr. Marius Ditch yesterday, which time spironolactone was increased.  I think Ms. Renae Gloss would also benefit from escalation of furosemide at least in the short-term.  I worry that she may have bowel wall edema leading to poor absorption of oral diuretics.  As she is scheduled for flushing of her port at the cancer center tomorrow, we will arrange for her to receive furosemide 80 mg IV x1 when her port is accessed.  I will also increase her standing furosemide to 80 mg twice daily.  She should continue her current dose of spironolactone.  We will have Ms. Youngren return in 1 week for BMP and repeat clinical evaluation.  We will plan to repeat an echocardiogram to ensure that she has not had interval decline in her cardiac function.  Of note echo last year showed preserved LVEF with diastolic dysfunction.  Follow-up: Return to clinic in 1 week.  Nelva Bush, MD 12/09/2020 7:19 AM

## 2020-12-09 ENCOUNTER — Ambulatory Visit: Payer: Medicare HMO

## 2020-12-09 ENCOUNTER — Inpatient Hospital Stay: Payer: Medicare HMO

## 2020-12-09 ENCOUNTER — Inpatient Hospital Stay: Payer: Medicare HMO | Attending: Hematology and Oncology | Admitting: Hematology and Oncology

## 2020-12-09 ENCOUNTER — Encounter: Payer: Self-pay | Admitting: Hematology and Oncology

## 2020-12-09 ENCOUNTER — Encounter: Payer: Self-pay | Admitting: Internal Medicine

## 2020-12-09 ENCOUNTER — Other Ambulatory Visit: Payer: Self-pay

## 2020-12-09 VITALS — BP 122/50 | HR 61 | Temp 98.0°F | Wt 204.1 lb

## 2020-12-09 DIAGNOSIS — E538 Deficiency of other specified B group vitamins: Secondary | ICD-10-CM

## 2020-12-09 DIAGNOSIS — M7989 Other specified soft tissue disorders: Secondary | ICD-10-CM | POA: Insufficient documentation

## 2020-12-09 DIAGNOSIS — R609 Edema, unspecified: Secondary | ICD-10-CM

## 2020-12-09 DIAGNOSIS — E8770 Fluid overload, unspecified: Secondary | ICD-10-CM | POA: Diagnosis not present

## 2020-12-09 DIAGNOSIS — I5033 Acute on chronic diastolic (congestive) heart failure: Secondary | ICD-10-CM

## 2020-12-09 DIAGNOSIS — I509 Heart failure, unspecified: Secondary | ICD-10-CM

## 2020-12-09 DIAGNOSIS — D510 Vitamin B12 deficiency anemia due to intrinsic factor deficiency: Secondary | ICD-10-CM | POA: Insufficient documentation

## 2020-12-09 MED ORDER — SODIUM CHLORIDE 0.9% FLUSH
10.0000 mL | Freq: Once | INTRAVENOUS | Status: AC | PRN
  Administered 2020-12-09: 10 mL
  Filled 2020-12-09: qty 10

## 2020-12-09 MED ORDER — HEPARIN SOD (PORK) LOCK FLUSH 100 UNIT/ML IV SOLN
500.0000 [IU] | Freq: Once | INTRAVENOUS | Status: AC | PRN
  Administered 2020-12-09: 500 [IU]
  Filled 2020-12-09: qty 5

## 2020-12-09 MED ORDER — FUROSEMIDE 10 MG/ML IJ SOLN
80.0000 mg | Freq: Once | INTRAMUSCULAR | Status: AC
  Administered 2020-12-09: 80 mg via INTRAMUSCULAR
  Filled 2020-12-09: qty 8

## 2020-12-09 MED ORDER — CYANOCOBALAMIN 1000 MCG/ML IJ SOLN
1000.0000 ug | Freq: Once | INTRAMUSCULAR | Status: AC
  Administered 2020-12-09: 1000 ug via INTRAMUSCULAR
  Filled 2020-12-09: qty 1

## 2020-12-09 NOTE — Telephone Encounter (Signed)
Received incoming call from [place] at the Cancer center. She let me know they received our fax and the patient is all set up for receiving the IV lasix today.  Spoke to patient to let her know. She was already aware and is heading to her appointment now. She was appreciative.

## 2020-12-09 NOTE — Progress Notes (Signed)
Patient received prescribed treatment in clinic. 80 mg IV Lasix via port a cath per Dr. Kem Parkinson instructions and B12 injection. Tolerated well. Patient stable at discharge.

## 2020-12-09 NOTE — Telephone Encounter (Addendum)
12/09/2020 Received fax from [facility] with order for IV Lasix; uploaded into pt's chart. Infusion encounter scheduled and confirmed w/ MD and Chemo nurses. Called Crystal [name] back and confirmed pt will be receiving the infusion today.  SRW

## 2020-12-09 NOTE — Progress Notes (Signed)
[facility]  [address] [place], [place] [address] Phone: [phone]  Fax: [phone]   Clinic Day:  12/09/2020  Referring physician: Martin Majestic, *  Chief Complaint: Crystal [name] is a 70 y.o. female with decompensated cirrhosis s/p TIPS procedure, right sided pulmonary nodules who is seen for assessment prior to IV Lasix.  HPI: The patient was last seen in the medical oncology clinic on 09/14/2020. At that time, she felt fatigued. She was eating well, but had lost 7 lbs (? fluid weight).  She denied any bleeding. Hematocrit was 29.1, hemoglobin 9.4, MCV 82.9, platelets 120,000, WBC 3,600 (ANC 2,500). Ferritin was 74 with an iron saturation of 59% and a TIBC of 151. AFP was 1.8. She received a vitamin B12 injection.  She saw Dr. Holley Raring on 10/14/2020. Her chronic kidney disease appeared to be advanced. Follow-up was planned for 3 months.  She was seen at [facility] on 11/23/2020 for an abscess on the right side of her leg. She was prescribed Keflex and doxycycline.  She was seen in the [facility] ER on 12/03/2020 for chronic swelling. She received IV lasix 60 mg.  The patient saw Dr. Marius Ditch on 12/07/2020. She reported severe swelling of both legs and weight gain of 15 lbs over the past few weeks. She had been taking Lasix 80 mg and spironolactone 100 mg. She was to increase spironolactone to 200 mg daily.  The patient saw Dr. Saunders Revel on 12/08/2020.  She reported worsening leg swelling and shortness of breath. It was felt that she may have bowel wall edema leading to poor absorption of oral diuretics. He discussed giving her 80 mg IV Lasix when she got her port flushed. Follow-up was planned for 1 week.  Labs on 11/09/2020 revealed a hematocrit of 33.3, hemoglobin 10.8, MCV 80.2, platelets 127,000, WBC 5,000. Ferritin was 42 with an iron saturation of 82% and a TIBC of 186.  She received vitamin B12 injections on 10/12/2020 and  11/09/2020.  During the interim, she has been fine. Her right leg abscess has improved. Her legs are swollen. She does not feel like there is fluid in her lungs. She denies nausea and vomiting.  She agrees to IV Lasix today.   Past Medical History:  Diagnosis Date  . Acute GI bleeding 02/09/2018  . Acute upper gastrointestinal bleeding 08/03/2018  . Anemia    TAKES IRON TAB  . Arthritis    SHOULDER  . B12 deficiency 02/18/2018  . Bleeding    GI  3/19  . Bronchitis    HX OF  . CHF (congestive heart failure) ([place])   . Cirrhosis ([place])   . Colon cancer screening   . Diabetes mellitus without complication ([place])    TYPE 2  . Fatty (change of) liver, not elsewhere classified   . Full dentures    UPPER AND LOWER  . Iron deficiency anemia due to chronic blood loss 02/18/2018  . Lung nodule, multiple   . Pernicious anemia 02/22/2018  . PUD (peptic ulcer disease)   . Raynaud's syndrome   . Upper GI bleeding 08/03/2018    Past Surgical History:  Procedure Laterality Date  . CATARACT EXTRACTION W/PHACO Right 02/06/2018   Procedure: CATARACT EXTRACTION PHACO AND INTRAOCULAR LENS PLACEMENT ([place]) RIGHT DIABETIC;  Surgeon: Leandrew Koyanagi, MD;  Location: [place];  Service: Ophthalmology;  Laterality: Right;  DIABETIC, ORAL MED  . CATARACT EXTRACTION W/PHACO Left 04/30/2018   Procedure: CATARACT EXTRACTION PHACO AND INTRAOCULAR  LENS PLACEMENT (IOC);  Surgeon: Leandrew Koyanagi, MD;  Location: [facility] ORS;  Service: Ophthalmology;  Laterality: Left;  [place] 01:04 AP% 21.3 CDE 13.66 Fluid pack lot # [id number]  . COLONOSCOPY WITH PROPOFOL N/A 03/25/2018   Procedure: COLONOSCOPY WITH PROPOFOL;  Surgeon: Lin Landsman, MD;  Location: [facility] ENDOSCOPY;  Service: Gastroenterology;  Laterality: N/A;  . DENTAL SURGERY     EXTRACTIONS  . ELECTROMAGNETIC NAVIGATION BROCHOSCOPY Right 01/27/2019   Procedure: ELECTROMAGNETIC NAVIGATION BRONCHOSCOPY;  Surgeon: [name], MD;  Location: [facility]  ORS;  Service: Cardiopulmonary;  Laterality: Right;  . ENTEROSCOPY N/A 07/28/2020   Procedure: Push ENTEROSCOPY;  Surgeon: Lin Landsman, MD;  Location: [facility] ENDOSCOPY;  Service: Gastroenterology;  Laterality: N/A;  . ESOPHAGOGASTRODUODENOSCOPY N/A 08/03/2018   Procedure: ESOPHAGOGASTRODUODENOSCOPY (EGD);  Surgeon: Lin Landsman, MD;  Location: [facility] ENDOSCOPY;  Service: Gastroenterology;  Laterality: N/A;  . ESOPHAGOGASTRODUODENOSCOPY (EGD) WITH PROPOFOL N/A 02/10/2018   Procedure: ESOPHAGOGASTRODUODENOSCOPY (EGD) WITH PROPOFOL;  Surgeon: Jonathon Bellows, MD;  Location: [facility] ENDOSCOPY;  Service: Gastroenterology;  Laterality: N/A;  . ESOPHAGOGASTRODUODENOSCOPY (EGD) WITH PROPOFOL N/A 03/25/2018   Procedure: ESOPHAGOGASTRODUODENOSCOPY (EGD) WITH PROPOFOL;  Surgeon: Lin Landsman, MD;  Location: [facility] ENDOSCOPY;  Service: Gastroenterology;  Laterality: N/A;  . ESOPHAGOGASTRODUODENOSCOPY (EGD) WITH PROPOFOL N/A 04/22/2018   Procedure: ESOPHAGOGASTRODUODENOSCOPY (EGD) WITH PROPOFOL with band ligation;  Surgeon: Lin Landsman, MD;  Location: [employer];  Service: Gastroenterology;  Laterality: N/A;  . ESOPHAGOGASTRODUODENOSCOPY (EGD) WITH PROPOFOL N/A 06/24/2018   Procedure: ESOPHAGOGASTRODUODENOSCOPY (EGD) WITH PROPOFOL;  Surgeon: Lin Landsman, MD;  Location: [facility] ENDOSCOPY;  Service: Gastroenterology;  Laterality: N/A;  . ESOPHAGOGASTRODUODENOSCOPY (EGD) WITH PROPOFOL N/A 11/19/2018   Procedure: ESOPHAGOGASTRODUODENOSCOPY (EGD) WITH PROPOFOL;  Surgeon: Lin Landsman, MD;  Location: [facility] ENDOSCOPY;  Service: Gastroenterology;  Laterality: N/A;  . ESOPHAGOGASTRODUODENOSCOPY (EGD) WITH PROPOFOL N/A 03/08/2020   Procedure: ESOPHAGOGASTRODUODENOSCOPY (EGD) WITH PROPOFOL;  Surgeon: Lin Landsman, MD;  Location: [facility] ENDOSCOPY;  Service: Gastroenterology;  Laterality: N/A;  . EYE SURGERY    . GIVENS CAPSULE STUDY N/A 03/29/2020   Procedure: GIVENS CAPSULE STUDY;  Surgeon:  Lin Landsman, MD;  Location: [facility] ENDOSCOPY;  Service: Gastroenterology;  Laterality: N/A;  . HEMORRHOID BANDING  03/25/2018   Procedure: HEMORRHOID BANDING;  Surgeon: Lin Landsman, MD;  Location: [facility] ENDOSCOPY;  Service: Gastroenterology;;  . IR EMBO ART  VEN HEMORR LYMPH EXTRAV  INC GUIDE ROADMAPPING  08/07/2018  . IR RADIOLOGIST EVAL & MGMT  09/03/2018  . IR RADIOLOGIST EVAL & MGMT  12/17/2018  . IR RADIOLOGIST EVAL & MGMT  06/24/2019  . IR RADIOLOGIST EVAL & MGMT  01/22/2020  . IR TIPS  08/07/2018  . PORTA CATH INSERTION N/A 07/30/2020   Procedure: PORTA CATH INSERTION;  Surgeon: Katha Cabal, MD;  Location: [place] CV LAB;  Service: Cardiovascular;  Laterality: N/A;  . RADIOLOGY WITH ANESTHESIA N/A 08/07/2018   Procedure: TIPS;  Surgeon: Corrie Mckusick, DO;  Location: [place];  Service: Anesthesiology;  Laterality: N/A;  . TONSILLECTOMY      Family History  Problem Relation Age of Onset  . CAD Father   . Heart attack Father 86  . Cancer Maternal Uncle   . Seizures Mother     Social History:  reports that she has never smoked. She has never used smokeless tobacco. She reports previous alcohol use. She reports that she does not use drugs. She retired from [employer] in 11/2017. She lives in [place]. The patient is alone today.  Allergies:  No Known Allergies  Current Medications: Current Outpatient Medications  Medication Sig Dispense Refill  . Cholecalciferol (VITAMIN D3) 1.25 MG (50000 UT) CAPS Take 1 capsule by mouth once a week.     . cyanocobalamin (,VITAMIN B-12,) 1000 MCG/ML injection Inject 1,000 mcg into the muscle every 30 (thirty) days.     . furosemide (LASIX) 80 MG tablet Take 1 tablet (80 mg total) by mouth 2 (two) times daily. (Patient taking differently: Take 40 mg by mouth 2 (two) times daily.) 180 tablet 1  . hydrOXYzine (ATARAX/VISTARIL) 25 MG tablet Take 25 mg by mouth 3 (three) times daily.    Marland Kitchen lactulose (CHRONULAC) 10 GM/15ML solution TAKE  15 MLS (10 G TOTAL) BY MOUTH 3 (THREE) TIMES DAILY. 1800 mL 2  . ondansetron (ZOFRAN) 4 MG tablet Take 4 mg by mouth every 8 (eight) hours as needed for nausea or vomiting.     Glory Rosebush Delica Lancets 16X MISC 2 (two) times daily.    Glory Rosebush ULTRA test strip 2 (two) times daily.    . pantoprazole (PROTONIX) 40 MG tablet Take 40 mg by mouth 2 (two) times daily.    . Potassium Chloride Crys ER (KLOR-CON M10 PO) Take 15 mg by mouth daily.    Marland Kitchen spironolactone (ALDACTONE) 100 MG tablet Take 1 tablet (100 mg total) by mouth 2 (two) times daily. 60 tablet 0  . XIFAXAN 550 MG TABS tablet Take 550 mg by mouth 2 (two) times daily.    . Misc. Devices KIT Moderate compression hose 20-30 MM HG (Patient not taking: Reported on 12/09/2020) 1 kit 0  . nystatin (MYCOSTATIN/NYSTOP) powder Apply 1 g topically daily as needed (rash).  (Patient not taking: Reported on 12/09/2020)     No current facility-administered medications for this visit.   Facility-Administered Medications Ordered in Other Visits  Medication Dose Route Frequency Provider Last Rate Last Admin  . sodium chloride flush (NS) 0.9 % injection 10 mL  10 mL Intravenous PRN Nolon Stalls C, MD   10 mL at 09/14/20 1320    Review of Systems  Constitutional: Positive for weight loss (2 lbs). Negative for chills, diaphoresis, fever and malaise/fatigue.  HENT: Negative for congestion, ear discharge, ear pain, hearing loss, nosebleeds, sinus pain, sore throat and tinnitus.   Eyes: Negative for blurred vision and double vision.  Respiratory: Negative for cough, hemoptysis, sputum production and shortness of breath.   Cardiovascular: Positive for leg swelling. Negative for chest pain and palpitations.  Gastrointestinal: Negative for abdominal pain, blood in stool, constipation, diarrhea, heartburn, melena, nausea and vomiting.  Genitourinary: Negative for dysuria, frequency, hematuria and urgency.  Musculoskeletal: Negative for back pain, joint  pain, myalgias and neck pain.  Skin: Negative for itching and rash.       Raynaud's.  Neurological: Negative for dizziness, tingling, sensory change, weakness and headaches.  Endo/Heme/Allergies: Does not bruise/bleed easily.       Diabetes.  Psychiatric/Behavioral: Negative for depression and memory loss. The patient is not nervous/anxious and does not have insomnia.   All other systems reviewed and are negative.  Performance status (ECOG): 1  Vitals Blood pressure (!) 122/50, pulse 61, temperature 98 F (36.7 C), temperature source Oral, weight 204 lb 2.3 oz (92.6 kg).   Physical Exam Vitals and nursing note reviewed.  Constitutional:      General: She is not in acute distress.    Appearance: Normal appearance. She is well-developed. She is not diaphoretic.     Interventions: Face mask  in place.  HENT:     Head: Normocephalic and atraumatic.     Comments: Short white hair. Eyes:     General: No scleral icterus.    Conjunctiva/sclera: Conjunctivae normal.     Comments: Glasses. Blue eyes.   Neck:     Vascular: No JVD.  Cardiovascular:     Rate and Rhythm: Normal rate and regular rhythm.     Heart sounds: Normal heart sounds. No murmur heard. No friction rub. No gallop.   Pulmonary:     Effort: Pulmonary effort is normal.     Breath sounds: Normal breath sounds. No wheezing, rhonchi or rales.  Musculoskeletal:     Right lower leg: Edema present.     Left lower leg: Edema present.     Comments: Chronic dense legs.  Skin:    General: Skin is warm and dry.     Coloration: Skin is not pale.     Findings: No erythema.  Neurological:     Mental Status: She is alert and oriented to person, place, and time.  Psychiatric:        Behavior: Behavior normal.        Thought Content: Thought content normal.        Judgment: Judgment normal.    No visits with results within 3 Day(s) from this visit.  Latest known visit with results is:  Admission on 12/03/2020, Discharged on  12/03/2020  Component Date Value Ref Range Status  . Sodium 12/03/2020 134* 135 - 145 mmol/L Final  . Potassium 12/03/2020 4.1  3.5 - 5.1 mmol/L Final  . Chloride 12/03/2020 103  98 - 111 mmol/L Final  . CO2 12/03/2020 17* 22 - 32 mmol/L Final  . Glucose, Bld 12/03/2020 118* 70 - 99 mg/dL Final   Glucose reference range applies only to samples taken after fasting for at least 8 hours.  . BUN 12/03/2020 22  8 - 23 mg/dL Final  . Creatinine, Ser 12/03/2020 1.79* 0.44 - 1.00 mg/dL Final  . Calcium 12/03/2020 8.6* 8.9 - 10.3 mg/dL Final  . GFR, Estimated 12/03/2020 31* >60 mL/min Final   Comment: (NOTE) Calculated using the CKD-EPI Creatinine Equation (2021)   . Anion gap 12/03/2020 14  5 - 15 Final   Performed at [facility], [address] [place],  [address]  . WBC 12/03/2020 4.8  4.0 - 10.5 K/uL Final  . RBC 12/03/2020 4.04  3.87 - 5.11 MIL/uL Final  . Hemoglobin 12/03/2020 10.6* 12.0 - 15.0 g/dL Final  . HCT 12/03/2020 33.2* 36.0 - 46.0 % Final  . MCV 12/03/2020 82.2  80.0 - 100.0 fL Final  . MCH 12/03/2020 26.2  26.0 - 34.0 pg Final  . MCHC 12/03/2020 31.9  30.0 - 36.0 g/dL Final  . RDW 12/03/2020 16.9* 11.5 - 15.5 % Final  . Platelets 12/03/2020 120* 150 - 400 K/uL Final   Comment: REPEATED TO VERIFY Immature Platelet Fraction may be clinically indicated, consider ordering this additional test [id number]   . nRBC 12/03/2020 0.0  0.0 - 0.2 % Final  . Neutrophils Relative % 12/03/2020 60  % Final  . Neutro Abs 12/03/2020 2.9  1.7 - 7.7 K/uL Final  . Lymphocytes Relative 12/03/2020 26  % Final  . Lymphs Abs 12/03/2020 1.3  0.7 - 4.0 K/uL Final  . Monocytes Relative 12/03/2020 11  % Final  . Monocytes Absolute 12/03/2020 0.5  0.1 - 1.0 K/uL Final  . Eosinophils Relative 12/03/2020 2  % Final  .  Eosinophils Absolute 12/03/2020 0.1  0.0 - 0.5 K/uL Final  . Basophils Relative 12/03/2020 1  % Final  . Basophils Absolute 12/03/2020 0.0  0.0 - 0.1 K/uL Final  .  Immature Granulocytes 12/03/2020 0  % Final  . Abs Immature Granulocytes 12/03/2020 0.01  0.00 - 0.07 K/uL Final   Performed at [facility], [address] [place], [place] [address]  . B Natriuretic Peptide 12/03/2020 514.6* 0.0 - 100.0 pg/mL Final   Performed at [facility], [address] [place], [place] [address]    Assessment:  TIAWANNA LUCHSINGER is a 70 y.o. female with a right lower lobe pulmonary nodule. She denies any smoking history.  Abdomen and pelvic CTon 02/09/2018 revealed an 1.8 cm spiculated density in right lower lobe concerning for malignancy. There was mildly nodular hepatic contourconcerning for hepatic cirrhosis. There was moderate splenomegalysuggesting portal venous hypertension. Mild ascites was noted. There were mildly enlarged retroperitoneal lymph nodes (largest 1.1 cm) concerning for possible metastatic disease or malignancy. There was a 7 cm left ovarian cyst. Further evaluation with MRI was recommended to evaluate for possible neoplasm. CA125was 351.3 and CEA1.1 on 02/21/2018.   Chest CTon 02/25/2018 revealed a 2.3 x 1.5 x 1.0 cm perifissural nodulewith spiculated margins in the right middle lobe. There was a 1.5 x 1.5 x 1.4 cm right lower lobe spiculated nodule. There was a moderate left sided pleural effusion.   PET scanon 03/08/2018 revealed a 2.5 x 1.8 cm sub solid nodular opacity in theRML(SUV 2.7) and a 1.6 cm roundedslightly spiculated RLL pulmonary nodule(SUV 2.1). There were no enlarged or hypermetabolic mediastinal or hilar lymph nodes. There was a small left pleural effusion. 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  Chest CTon 05/15/2018 revealed no significant changes in the 2 previously noted nodules in the RIGHT lung. Irregular RML lesion measures 2.4 x 1.4 cm (previously 2.3 x 1.5 cm). Spiculated central RLL lesion  measures 1.8 x 1.4 cm (previously 1.6 x 1.5 cm). Both areas remain concerning for neoplasm. There was interval decrease in ascites volume overall. Upper abdominal lymphadenopathy noted that was felt to be reactive due to underlying liver disease.   Chest CTon 08/15/2018 revealed the spiculated right lower lobe lung lesion appeared slightly increased and was not significantly changed in size compared with previous exam. Morphologically, this was worrisome for a small bronchogenic neoplasm. The right middle lobe lung nodule was slightly decreased in size in the interval and may be post infectious or inflammatory in etiology.  Super D chest CTon 01/24/2019 revealed a 3.2 x 2.3 cm ill-defined anterior right lung nodule compared to 2.9 x 2.3 cm previously. Minor fissurewasnot well, and as such, this nodule can not be confidently localized to the right upper or middle lobe. The right lower lobe nodule measured2.0 x 2.0 cm comparedto 2.0 x 1.9 cm.  Chest CTon 08/07/2019 revealed no significant interval change in size right middle lobe and right lower lobe lung nodules.Liverwascompatible with cirrhosiss/pTIPS.Chest CTon 02/02/2020 revealed stable nodules within the right middle and right lower lobes.Therewerecirrhotic changesof the liver with TIPS shunt in place. There was mild prominence of the ascending aorta to 4 cm. Recommendedannual imaging followup by CTA or MRA.  She was admitted to [facility] from 02/09/2018 - 04/02/2019with a variceal hemorrhage. EGDon 02/10/2018 revealed 5 columns of grade III esophageal varices in the lower third of the esophagus. There was stigmata of recent bleeding and red wale signs. She underwent  variceal ligation x 10. She required 3 units of PRBCs.  EGDon 03/25/2018 revealed 2 angiectasias(non-bleeding) in the second portion of the duodenum, and a few diminutive (non-bleeding) angiectasias in the prepyloric region of the stomach that were treated  APC. There were 4 non-bleeding ulcers ([place] Class III) found in the gastric fundus. There were 4 columns of large non-bleeding varicesin the lower third of the esophagus, of which demonstrated no stigmata of bleeding. Varices were banded. EGD on 04/22/2018 revealed one non-bleeding cratered gastric ulcer([place] Class III) in the lesser curvature of the gastric body. Duodenal bulb and second portion of the duodenum were normal. Moderate portal hypertensive gastropathy noted in the stomach. Large varices in the lower third of the esophagus noted. 2 bands were placed with incomplete eradication of the lesions. EGD on 03/08/2020 revealed a few non-bleeding angioectasias in the duodenum and a gastric antral vascular ectasia with bleeding treated with argon plasma coagulation (APC).  There was a single non-bleeding angioectasia in the stomach treated with bipolar cautery. Gastroesophageal junction and esophagus were normal. No specimens were collected.  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  Colonoscopyon 03/25/2018 revealed a single 5 mm sessile polyp in the ascending colon. Patient has rectal varices and external hemorrhoids. Pathology returned as tubular adenoma, and was negative for high grade dysplasia and malignancy.   She has iron deficiency anemia. Ferritinwas 6. She is on ferrous sulfate 325 mg TID. She hasB12 deficiency. B12was 103. Intrinsic factor antibody was negative. 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 She began B12 injections on 03/06/2018 (last 11/09/2020). Folate was19.2on06/06/2020. Dietis good.  She received Venoferweekly x 4 (07/12/2018 - 07/31/2018), x 3 (12/22/2019 -01/05/2020), x 3  (03/30/2020 - 04/13/2020), 07/08/2020, 07/21/2020, and x 3 (08/05/2020 - 08/19/2020).  Ferritinhas been followed: 6 on 02/09/2018, 16 on 05/24/2018, 13 on 07/10/2018, 374 on 08/15/2018, 485 on 08/21/2018, 114 on 09/25/2018, 63 on 11/20/2018, 35 on 02/12/2019, 22 on 04/24/2019, 41 on 09/22/2019, 41 on 10/27/2019, 21 on 12/15/2019, 22 on 03/22/2020, 20 on 03/26/2020, 271 on 04/14/2020, 57 on 05/24/2020, 23 on 06/30/2020, 26 on 08/05/2020, 74 on 09/14/2020 and 42 on 11/09/2020.  She has hepatitis C antibodies. HCV RNA was not detected. She has been exposed to hepatitis B. Anti-smooth muscle antibodiesare weakly positive. ANAwas positive (>1:1280 centromere antibody). Negative studiesincluded: ceruloplasmin, anti-mitochondrial antibodies, hepatitis B IgM, HIV, and alpha-1 antitrypsin. PT was 15.2 (INR 1.21). AFPwas 1.5 on 05/28/2018,2.4 on 11/20/2018, 2.0 on 04/24/2019, and 1.8 on 09/14/2020.  She has a history of decompensated cirrhosiss/p TIPSprocedure 08/07/2018. She has portal hypertension and a history of recurrent bleeding esophageal varices.She was given a 2-year life expectancyfollowing her TIPS procedure. Abdominal/pelvic arterial/venous ultrasound doppleron 04/24/2019 revealed a patent TIPS and no evidence of persisting ascites.  Abdominal MRI including MRCPon 11/12/2019 revealed cirrhosis with TIPS in place.There was no biliary duct dilatation.There was cholelithiasis without acute cholecystitis. Right hepatic lobe 9 mm lesion whichwas indeterminate, LR 3.This was possibly present on the prior CT of 2019. Recommendation includedrepeat pre and post contrast abdominal MRI at 3-6 months. There as small volume abdominal ascites.The right lower lobe pulmonary nodulewas suboptimally evaluated.  [facility] with[facility] mental status.HeadCT revealedno acute intracranial pathology. Head MRIwo contrast showed symmetric FLAIR  hyperintensity within the bilateral mamillary bodies. This finding may be seen in the setting  of Wernicke encephalopathy.There was no evidence of acute infarct and mild chronic small vessels with ischemic disease.Her mental status returned to baseline.  RUQ ultrasoundon 05/29/2018 revealed cholelithiasis. There was a thickened edematous gallbladder wall. The wall thickening could be due to hypoproteinemia/hypoalbuminemia or cholecystitis. Negative sonographic Murphy's sign. There was a heterogeneous slightly nodular liver consistent with cirrhosis.   Renal ultrasound on 06/15/2020 revealed marked splenomegaly (13.9 x 15.2 x 5.6 cm; 618 ml) similar to comparison study of 05/28/2020 and MR of the abdomen on 11/12/2019.  Pelvic ultrasoundon 03/18/2018 demonstrated a simple cyst in the LEFT ovarymeasuring 6.1 x 4.5 x 6.6 cm. Repeat imaging recommended in 1 year to document stability.  Patient declined follow-up.  The patient received the [place] COVID-19 vaccine on 12/26/2019 and 01/21/2020.  Symptomatically, she feels fine. Her right leg abscess has improved. Her legs are swollen. She denies any increased shortness of breath.  Plan: 1.   Review interim labs  2.Decompensated cirrhosis s/p TIPS Clinically,doing fair. Life expectancy is limited s/pTIPS on 08/07/2018 (2 years). She denies any bleeding or pain AFPis 1.8 on 09/14/2020. Continue liver imaging every 6 months for Dunn surveillance (last 05/28/2020).   Patient to continue follow-up with GI on a regular basis. 3.Pulmonary nodules Chest CTon 02/02/2020 revealed stable nodules within the right middle and right lower lobes.  Anticipate chest CT in 01/2021. 4.Iron deficiency anemia Hematocrit 29.1.  Hemoglobin 9.4.  MCV 82.9 on 09/14/2020.  Ferritin 74 with an iron saturation of 59% and a TIBC of 151. Hematocrit 33.2.  Hemoglobin 10.6.  MCV 82.2 on 12/03/2020.  Ferritin 42 on 11/09/2020. Continue to  monitor. 5.B12 deficiency Patient receives B12 monthly (last12/28/2021) B12 today and monthly x 6. 6.   Fluid overload  Dr. Saunders Revel request that patient receives IV Lasix secondary to lower extremity swelling and concern about bowel edema.  Orders received.  Potassium normal.  Patient consents to treatment. 7.   B12 today. 8.   Lasix 80 mg IV. 9.   RTC as previously scheduled.  I discussed the assessment and treatment plan with the patient.  The patient was provided an opportunity to ask questions and all were answered.  The patient agreed with the plan and demonstrated an understanding of the instructions.  The patient was advised to call back if the symptoms worsen or if the condition fails to improve as anticipated.   Lequita Asal, MD, PhD    12/09/2020, 11:27 AM  I, Mirian Mo Tufford, am acting as Education administrator for [employer]. Mike Gip, MD, PhD.  I, Taniyah Ballow C. Mike Gip, MD, have reviewed the above documentation for accuracy and completeness, and I agree with the above.

## 2020-12-09 NOTE — Telephone Encounter (Signed)
Received Secure chat with Dr Mike Gip and Dr End.  I was advised to fax furosemide 80 mg IV x1 dose to the [place] [facility] office at [phone].  Fax sent successfully.

## 2020-12-14 ENCOUNTER — Other Ambulatory Visit
Admission: RE | Admit: 2020-12-14 | Discharge: 2020-12-14 | Disposition: A | Discharge status: Home / Self Care | Payer: Medicare HMO | Attending: Gastroenterology | Admitting: Gastroenterology

## 2020-12-14 ENCOUNTER — Ambulatory Visit (INDEPENDENT_AMBULATORY_CARE_PROVIDER_SITE_OTHER): Payer: Medicare HMO

## 2020-12-14 ENCOUNTER — Other Ambulatory Visit: Payer: Self-pay

## 2020-12-14 DIAGNOSIS — R609 Edema, unspecified: Secondary | ICD-10-CM | POA: Insufficient documentation

## 2020-12-14 DIAGNOSIS — K7460 Unspecified cirrhosis of liver: Secondary | ICD-10-CM | POA: Insufficient documentation

## 2020-12-14 DIAGNOSIS — R635 Abnormal weight gain: Secondary | ICD-10-CM | POA: Insufficient documentation

## 2020-12-14 DIAGNOSIS — K7290 Hepatic failure, unspecified without coma: Secondary | ICD-10-CM | POA: Diagnosis not present

## 2020-12-14 DIAGNOSIS — M25471 Effusion, right ankle: Secondary | ICD-10-CM | POA: Diagnosis not present

## 2020-12-14 DIAGNOSIS — I5033 Acute on chronic diastolic (congestive) heart failure: Secondary | ICD-10-CM

## 2020-12-14 DIAGNOSIS — M25472 Effusion, left ankle: Secondary | ICD-10-CM | POA: Insufficient documentation

## 2020-12-14 DIAGNOSIS — M25474 Effusion, right foot: Secondary | ICD-10-CM | POA: Insufficient documentation

## 2020-12-14 DIAGNOSIS — M25475 Effusion, left foot: Secondary | ICD-10-CM | POA: Insufficient documentation

## 2020-12-14 LAB — ECHOCARDIOGRAM COMPLETE
AR max vel: 2.55 cm2
AV Area VTI: 2.36 cm2
AV Area mean vel: 2.42 cm2
AV Mean grad: 4 mmHg
AV Peak grad: 6.6 mmHg
Ao pk vel: 1.28 m/s
Area-P 1/2: 4.31 cm2
Calc EF: 57.3 %
S' Lateral: 3.9 cm
Single Plane A2C EF: 56.7 %
Single Plane A4C EF: 55.1 %

## 2020-12-14 LAB — BASIC METABOLIC PANEL
Anion gap: 13 (ref 5–15)
BUN: 21 mg/dL (ref 8–23)
CO2: 20 mmol/L — ABNORMAL LOW (ref 22–32)
Calcium: 8.3 mg/dL — ABNORMAL LOW (ref 8.9–10.3)
Chloride: 97 mmol/L — ABNORMAL LOW (ref 98–111)
Creatinine, Ser: 2.16 mg/dL — ABNORMAL HIGH (ref 0.44–1.00)
GFR, Estimated: 24 mL/min — ABNORMAL LOW (ref >60)
Glucose, Bld: 126 mg/dL — ABNORMAL HIGH (ref 70–99)
Potassium: 3.8 mmol/L (ref 3.5–5.1)
Sodium: 130 mmol/L — ABNORMAL LOW (ref 135–145)

## 2020-12-14 LAB — MAGNESIUM: Magnesium: 1.9 mg/dL (ref 1.7–2.4)

## 2020-12-14 LAB — PHOSPHORUS: Phosphorus: 3.1 mg/dL (ref 2.5–4.6)

## 2020-12-16 ENCOUNTER — Ambulatory Visit (INDEPENDENT_AMBULATORY_CARE_PROVIDER_SITE_OTHER): Payer: Medicare HMO | Admitting: Family

## 2020-12-16 ENCOUNTER — Other Ambulatory Visit: Payer: Self-pay

## 2020-12-16 ENCOUNTER — Encounter: Payer: Self-pay | Admitting: Family

## 2020-12-16 VITALS — BP 120/60 | HR 62 | Ht 62.0 in | Wt 196.4 lb

## 2020-12-16 DIAGNOSIS — M25471 Effusion, right ankle: Secondary | ICD-10-CM | POA: Diagnosis not present

## 2020-12-16 DIAGNOSIS — I5032 Chronic diastolic (congestive) heart failure: Secondary | ICD-10-CM | POA: Diagnosis not present

## 2020-12-16 DIAGNOSIS — K7460 Unspecified cirrhosis of liver: Secondary | ICD-10-CM

## 2020-12-16 DIAGNOSIS — R609 Edema, unspecified: Secondary | ICD-10-CM

## 2020-12-16 DIAGNOSIS — M25472 Effusion, left ankle: Secondary | ICD-10-CM

## 2020-12-16 DIAGNOSIS — I2510 Atherosclerotic heart disease of native coronary artery without angina pectoris: Secondary | ICD-10-CM

## 2020-12-16 DIAGNOSIS — R635 Abnormal weight gain: Secondary | ICD-10-CM

## 2020-12-16 DIAGNOSIS — I2584 Coronary atherosclerosis due to calcified coronary lesion: Secondary | ICD-10-CM | POA: Diagnosis not present

## 2020-12-16 DIAGNOSIS — M25474 Effusion, right foot: Secondary | ICD-10-CM | POA: Diagnosis not present

## 2020-12-16 DIAGNOSIS — M25475 Effusion, left foot: Secondary | ICD-10-CM | POA: Diagnosis not present

## 2020-12-16 DIAGNOSIS — D500 Iron deficiency anemia secondary to blood loss (chronic): Secondary | ICD-10-CM

## 2020-12-16 DIAGNOSIS — D649 Anemia, unspecified: Secondary | ICD-10-CM

## 2020-12-16 MED ORDER — FUROSEMIDE 80 MG PO TABS: ORAL_TABLET | ORAL | 1 refills | Status: DC

## 2020-12-16 NOTE — Patient Instructions (Addendum)
Medication Instructions:  Your physician has recommended you make the following change in your medication:   CHANGE Furosemide (Lasix) to 65m in the morning with an additional 869min the afternoon as needed for weight gain of 2 pounds overnight or 5 pounds in one week  *If you need a refill on your cardiac medications before your next appointment, please call your pharmacy*  Lab Work: No lab work today. We will ask Dr. CoMike Gipo add on a BMP to your next lab work.   Testing/Procedures: Your echocardiogram was stable compared to previous. This is a good result!  Follow-Up: [facility].  As part of our continuing mission to provide you with exceptional heart care, we have created designated Provider Care Teams.  These Care Teams include your primary Cardiologist (physician) and Advanced Practice Providers (APPs -  Physician Assistants and Nurse Practitioners) who all work together to provide you with the care you need, when you need it.  We recommend signing up for the patient portal called "MyChart".  Sign up information is provided on this After Visit Summary.  MyChart is used to connect with patients for Virtual Visits (Telemedicine).  Patients are able to view lab/test results, encounter notes, upcoming appointments, etc.  Non-urgent messages can be sent to your provider as well.   To learn more about what you can do with MyChart, go to [email or web address]   Your next appointment:   1 month(s)  The format for your next appointment:   In Person  Provider:   You may see ChNelva BushMD or one of the following Advanced Practice Providers on your designated Care Team:    ChMurray HodgkinsNP  RyChristell FaithPA-C  JaMarrianne MoodPA-C  Cadence FuHoisingtonPAVermontCaLaurann MontanaNP  Other Instructions  Recommend drinking less than 2 liters of fluid per day to protect your sodium levels from dropping.   This would be about  four 16oz bottles.   . Recommend establishing with a primary care provider.  o You may call [facility] [phone] a list of primary care providers in your area or visit their website [email or web address] Please have any insurance card available before calling or going online.  o [place] [facility] [employer] both wonderful groups.

## 2020-12-16 NOTE — Progress Notes (Signed)
Office Visit    Patient Name: Crystal [name] Date of Encounter: 12/16/2020  Primary Care Provider:  Martin Majestic, FNP Primary Cardiologist:  Nelva Bush, MD Electrophysiologist:  None   Chief Complaint    Crystal [name] is a 70 y.o. female with a hx of chronic HFpEF, cirrhosis complicated by esophageal varices status post TIPS, peptic ulcer disease, DM2, pulmonary nodules, iron deficiency anemia, vitamin B12 deficiency  presents today for follow up after echocardiogram.   Past Medical History    Past Medical History:  Diagnosis Date  . Acute GI bleeding 02/09/2018  . Acute upper gastrointestinal bleeding 08/03/2018  . Anemia    TAKES IRON TAB  . Arthritis    SHOULDER  . B12 deficiency 02/18/2018  . Bleeding    GI  3/19  . Bronchitis    HX OF  . CHF (congestive heart failure) ([place])   . Cirrhosis ([place])   . Colon cancer screening   . Diabetes mellitus without complication ([place])    TYPE 2  . Fatty (change of) liver, not elsewhere classified   . Full dentures    UPPER AND LOWER  . Iron deficiency anemia due to chronic blood loss 02/18/2018  . Lung nodule, multiple   . Pernicious anemia 02/22/2018  . PUD (peptic ulcer disease)   . Raynaud's syndrome   . Upper GI bleeding 08/03/2018   Past Surgical History:  Procedure Laterality Date  . CATARACT EXTRACTION W/PHACO Right 02/06/2018   Procedure: CATARACT EXTRACTION PHACO AND INTRAOCULAR LENS PLACEMENT ([place]) RIGHT DIABETIC;  Surgeon: Leandrew Koyanagi, MD;  Location: [place];  Service: Ophthalmology;  Laterality: Right;  DIABETIC, ORAL MED  . CATARACT EXTRACTION W/PHACO Left 04/30/2018   Procedure: CATARACT EXTRACTION PHACO AND INTRAOCULAR LENS PLACEMENT ([facility]);  Surgeon: Leandrew Koyanagi, MD;  Location: [facility];  Service: Ophthalmology;  Laterality: Left;  [place] 01:04 AP% 21.3 CDE 13.66 Fluid pack lot # [id number]  . COLONOSCOPY WITH PROPOFOL N/A 03/25/2018   Procedure: COLONOSCOPY WITH PROPOFOL;  Surgeon:  Lin Landsman, MD;  Location: [facility] ENDOSCOPY;  Service: Gastroenterology;  Laterality: N/A;  . DENTAL SURGERY     EXTRACTIONS  . ELECTROMAGNETIC NAVIGATION BROCHOSCOPY Right 01/27/2019   Procedure: ELECTROMAGNETIC NAVIGATION BRONCHOSCOPY;  Surgeon: [name], MD;  Location: [facility];  Service: Cardiopulmonary;  Laterality: Right;  . ENTEROSCOPY N/A 07/28/2020   Procedure: Push ENTEROSCOPY;  Surgeon: Lin Landsman, MD;  Location: [facility] ENDOSCOPY;  Service: Gastroenterology;  Laterality: N/A;  . ESOPHAGOGASTRODUODENOSCOPY N/A 08/03/2018   Procedure: ESOPHAGOGASTRODUODENOSCOPY (EGD);  Surgeon: Lin Landsman, MD;  Location: [facility] ENDOSCOPY;  Service: Gastroenterology;  Laterality: N/A;  . ESOPHAGOGASTRODUODENOSCOPY (EGD) WITH PROPOFOL N/A 02/10/2018   Procedure: ESOPHAGOGASTRODUODENOSCOPY (EGD) WITH PROPOFOL;  Surgeon: Jonathon Bellows, MD;  Location: [facility] ENDOSCOPY;  Service: Gastroenterology;  Laterality: N/A;  . ESOPHAGOGASTRODUODENOSCOPY (EGD) WITH PROPOFOL N/A 03/25/2018   Procedure: ESOPHAGOGASTRODUODENOSCOPY (EGD) WITH PROPOFOL;  Surgeon: Lin Landsman, MD;  Location: [facility] ENDOSCOPY;  Service: Gastroenterology;  Laterality: N/A;  . ESOPHAGOGASTRODUODENOSCOPY (EGD) WITH PROPOFOL N/A 04/22/2018   Procedure: ESOPHAGOGASTRODUODENOSCOPY (EGD) WITH PROPOFOL with band ligation;  Surgeon: Lin Landsman, MD;  Location: [place];  Service: Gastroenterology;  Laterality: N/A;  . ESOPHAGOGASTRODUODENOSCOPY (EGD) WITH PROPOFOL N/A 06/24/2018   Procedure: ESOPHAGOGASTRODUODENOSCOPY (EGD) WITH PROPOFOL;  Surgeon: Lin Landsman, MD;  Location: [facility] ENDOSCOPY;  Service: Gastroenterology;  Laterality: N/A;  . ESOPHAGOGASTRODUODENOSCOPY (EGD) WITH PROPOFOL N/A 11/19/2018   Procedure: ESOPHAGOGASTRODUODENOSCOPY (EGD) WITH PROPOFOL;  Surgeon: Lin Landsman, MD;  Location:  [facility] ENDOSCOPY;  Service: Gastroenterology;  Laterality: N/A;  . ESOPHAGOGASTRODUODENOSCOPY (EGD) WITH  PROPOFOL N/A 03/08/2020   Procedure: ESOPHAGOGASTRODUODENOSCOPY (EGD) WITH PROPOFOL;  Surgeon: Lin Landsman, MD;  Location: [facility] ENDOSCOPY;  Service: Gastroenterology;  Laterality: N/A;  . EYE SURGERY    . GIVENS CAPSULE STUDY N/A 03/29/2020   Procedure: GIVENS CAPSULE STUDY;  Surgeon: Lin Landsman, MD;  Location: [facility] ENDOSCOPY;  Service: Gastroenterology;  Laterality: N/A;  . HEMORRHOID BANDING  03/25/2018   Procedure: HEMORRHOID BANDING;  Surgeon: Lin Landsman, MD;  Location: [facility] ENDOSCOPY;  Service: Gastroenterology;;  . IR EMBO ART  VEN HEMORR LYMPH EXTRAV  INC GUIDE ROADMAPPING  08/07/2018  . IR RADIOLOGIST EVAL & MGMT  09/03/2018  . IR RADIOLOGIST EVAL & MGMT  12/17/2018  . IR RADIOLOGIST EVAL & MGMT  06/24/2019  . IR RADIOLOGIST EVAL & MGMT  01/22/2020  . IR TIPS  08/07/2018  . PORTA CATH INSERTION N/A 07/30/2020   Procedure: PORTA CATH INSERTION;  Surgeon: Katha Cabal, MD;  Location: [place] CV LAB;  Service: Cardiovascular;  Laterality: N/A;  . RADIOLOGY WITH ANESTHESIA N/A 08/07/2018   Procedure: TIPS;  Surgeon: Corrie Mckusick, DO;  Location: [place];  Service: Anesthesiology;  Laterality: N/A;  . TONSILLECTOMY     Allergies No Known Allergies  History of Present Illness    Crystal [name] is a 70 y.o. female with a hx of chronic HFpEF, cirrhosis complicated by esophageal varices status post TIPS, peptic ulcer disease, DM2, pulmonary nodules, iron deficiency anemia, vitamin B12 deficiency last seen 12/08/20 by Dr. Saunders Revel.  She has had coronary artery calcification and aortic atherosclerosis incidentally noted on prior CTs.  No symptoms suggestive of CAD.   Echo 04/04/2019 LVEF 55 to 60%, mild LVH, LV diastolic parameters consistent with impaired relaxation, RV normal size and function, no significant valvular abnormalities, mild dilation of aortic root 3.5 cm.  Echo 05/27/20 with normal LVEF and grade 2 diastolic dysfunction, RV mildly enlarged, no significant  valvular abnormalities.   Clinic visit 12/2019 increased Lasix to 72m until weight returned to baseline as she was 6 pounds up. Underwent EGD 03/08/2020 with GI and capsule study 03/29/2020.   Follows with oncology for B12 deficiency/iron deficiency anemia. LE duplex 03/30/20 ordered by oncology due to LE edema with no evidence of acute DVT bilaterally. Noted chronic DVT, calcified, at the junction of distal common femoral vein and origin of the femoral vein with diminished flow focally at this site. As it was chronic, not recommended for anticoagulation especially in setting of anemia.    Seen in urgent care 11/23/20 due to boil on right calf treated with cephalexin and doxycycline. She had continued swelling up into her abdomen and was seen in the ED 12/03/20 ang vien Furosemide 679mIVx1 and discharged home. She saw her gastroenterologist 12/04/20 and Spironolactone increased to 10081maily. Seen in follow up by Dr. EndSaunders Revel26/22 with no improvement in edema. Her Reds Vest was only mildly elevated at 39%. Concern was that bowel wall edema was leading to poor absorption of oral diuretics. Dr. EndSaunders Revelranged for her to received furosemide 32m85m x1 when port was accessed at the cancer center 12/09/20. She was also recommended for repeat echocardiogram. Echo 12/14/20 LVEF 55-60% with no wall motion abnormalities, LV moderately dilated, gr1DD, RV normal size and function, bilateral atria mildly dilated, mild MR, trivial AI.  Repeat lab work 12/14/20 with Na 130, creatinine 2.16, GFR 24.   Presents today for follow-up.  Her weight is down 10 pounds since she was last seen.  Tells me she notices marked improvement in her abdominal and calf swelling though still some edema in her thighs. She is drinking 3 sixteen ounce waters and 2 sixteen ounce lemonade per day.  Reiterated recommendation for less than 2 L fluid per day.  She did call her nephrologist to see if she can get an appointment sooner than March at the direction  of her gastroenterologist but has not yet heard from their office.  Encouraged to call again.  She has been taking Lasix 80 mg twice per day since last seen by Dr. Saunders Revel.  She has also been taking spironolactone 100 mg twice daily at the direction of gastroenterology.  She does also tell me that she fell back over the summer and landed on her tailbone.  She was evaluated by her PCP at that time who told her there is not much to do.  Today on examination she has a large knot directly above her tailbone that is somewhat tender on palpation.  Tells me she previously applied heat and this was helpful.  Encouraged her to discuss with her primary care provider again as may benefit from imaging such as x-ray.  EKGs/Labs/Other Studies Reviewed:   The following studies were reviewed today:  Echo 12/14/2020  1. Left ventricular ejection fraction, by estimation, is 55 to 60%. The  left ventricle has normal function. The left ventricle has no regional  wall motion abnormalities. The left ventricular internal cavity size was  moderately dilated. There is mild  left ventricular hypertrophy. Left ventricular diastolic parameters are  consistent with Grade I diastolic dysfunction (impaired relaxation).  Elevated left atrial pressure.   2. Right ventricular systolic function is normal. The right ventricular  size is normal. Tricuspid regurgitation signal is inadequate for assessing  PA pressure.   3. Left atrial size was mildly dilated.   4. Right atrial size was mildly dilated.   5. The mitral valve is degenerative. Mild mitral valve regurgitation. No  evidence of mitral stenosis. Moderate mitral annular calcification.   6. The aortic valve is tricuspid. There is mild calcification of the  aortic valve. There is mild thickening of the aortic valve. Aortic valve  regurgitation is trivial. Mild to moderate aortic valve  sclerosis/calcification is present, without any  evidence of aortic stenosis.   7. Aortic  dilatation noted. There is mild dilatation of the ascending  aorta, measuring 36 mm.   EKG:  No EKG today  Recent Labs: 04/22/2020: ALT 16 05/27/2020: TSH 3.742 12/03/2020: B Natriuretic Peptide 514.6; Hemoglobin 10.6; Platelets 120 12/14/2020: BUN 21; Creatinine, Ser 2.16; Magnesium 1.9; Potassium 3.8; Sodium 130  Recent Lipid Panel    Component Value Date/Time   TRIG 163 (H) 08/03/2018 2152   Home Medications   Current Meds  Medication Sig  . Cholecalciferol (VITAMIN D3) 1.25 MG (50000 UT) CAPS Take 1 capsule by mouth once a week.   . cyanocobalamin (,VITAMIN B-12,) 1000 MCG/ML injection Inject 1,000 mcg into the muscle every 30 (thirty) days.   . hydrOXYzine (ATARAX/VISTARIL) 25 MG tablet Take 25 mg by mouth 3 (three) times daily.  Marland Kitchen lactulose (CHRONULAC) 10 GM/15ML solution TAKE 15 MLS (10 G TOTAL) BY MOUTH 3 (THREE) TIMES DAILY.  . Misc. Devices KIT Moderate compression hose 20-30 MM HG  . nystatin (MYCOSTATIN/NYSTOP) powder Apply 1 g topically daily as needed (rash).  . ondansetron (ZOFRAN) 4 MG tablet Take 4 mg by mouth every 8 (  eight) hours as needed for nausea or vomiting.   Glory Rosebush Delica Lancets 33I MISC 2 (two) times daily.  Glory Rosebush ULTRA test strip 2 (two) times daily.  . pantoprazole (PROTONIX) 40 MG tablet Take 40 mg by mouth 2 (two) times daily.  . Potassium Chloride Crys ER (KLOR-CON M10 PO) Take 15 mg by mouth daily.  Marland Kitchen spironolactone (ALDACTONE) 100 MG tablet Take 1 tablet (100 mg total) by mouth 2 (two) times daily.  Marland Kitchen XIFAXAN 550 MG TABS tablet Take 550 mg by mouth 2 (two) times daily.  . [DISCONTINUED] furosemide (LASIX) 80 MG tablet Take 1 tablet (80 mg total) by mouth 2 (two) times daily.     Review of Systems   All other systems reviewed and are otherwise negative except as noted above.  Physical Exam    VS:  BP 120/60 (BP Location: Right Arm, Patient Position: Sitting, Cuff Size: Large)   Pulse 62   Ht 5' 2" (1.575 m)   Wt 196 lb 6 oz (89.1 kg)    LMP  (LMP Unknown)   SpO2 98%   BMI 35.92 kg/m  , BMI Body mass index is 35.92 kg/m.  Wt Readings from Last 3 Encounters:  12/16/20 196 lb 6 oz (89.1 kg)  12/09/20 204 lb 2.3 oz (92.6 kg)  12/08/20 206 lb 2 oz (93.5 kg)    GEN: Well nourished, overweight, well developed, in no acute distress. HEENT: normal. Neck: Supple, no JVD, carotid bruits, or masses. Cardiac: RRR, no murmurs, rubs, or gallops. No clubbing, cyanosis.  Nonpitting edema to bilateral lower remedy.  No erythema, signs of infection.  Radials/DP/PT 2+ and equal bilaterally.  Respiratory:  Respirations regular and unlabored, clear to auscultation bilaterally. GI: Soft, nontender, nondistended. MS: No deformity or atrophy. Skin: Warm and dry, no rash. Neuro:  Strength and sensation are intact. Psych: Normal affect.  Assessment & Plan    1. HFpEF/cirrhosis - Weight down 10 lbs.  Still approximately 6 pounds from her dry weight.  Edema improving.  Reports stable dyspnea on exertion.  No orthopnea, PND.  Continuing Spironolactone 100 mg twice daily at the direction of Dr. Marius Ditch of gastroenterology.  BMP 12/14/2020 with NA 130, K3.8, creatinine 2.16, GFR 24.  Noted renal function decline compared to previous.  Reduce Lasix to 80 mg daily with additional 80 mg as needed in the afternoon for weight gain of 2 pounds overnight or 5 pounds in 1 week.  Plan for repeat BMP with upcoming lab work 01/03/21 with hematology be important.  2. Hyponatremia - Lab work 12/14/2020 shows sodium 130.  Previously 134.Admitted for the no noted previous history of hyponatremia.  Discussed fluid restriction of less than 2 L, she is drinking approximately 2.5 L/day.  Careful monitoring.  Denies mentation changes.  Plan for repeat BMP with her upcoming lab 01/03/21 work with hematology via port.  3. Coronary artery calcification on CT scan and aortic atherosclerosis -no anginal symptoms.  No indication for ischemic evaluation this time.  No statin due to  cirrhosis/NASH.  No aspirin due to esophageal varices   4. CKD IIIb -continue to follow with nephrology.  Recommend she contact their office for follow-up due to slight decline in renal function.  Will forward office note to Dr. Holley Raring for his review.   5. Anemia - Continue to follow with hematology.   Disposition: Follow up in 1 month(s) with Dr. Saunders Revel or APP   Signed, Loel Dubonnet, NP 12/16/2020, 4:17 PM [facility]

## 2020-12-24 ENCOUNTER — Ambulatory Visit: Payer: Medicare HMO | Admitting: Gastroenterology

## 2021-01-02 ENCOUNTER — Other Ambulatory Visit: Payer: Self-pay | Admitting: Gastroenterology

## 2021-01-02 DIAGNOSIS — R635 Abnormal weight gain: Secondary | ICD-10-CM

## 2021-01-02 DIAGNOSIS — M25474 Effusion, right foot: Secondary | ICD-10-CM

## 2021-01-02 DIAGNOSIS — I509 Heart failure, unspecified: Secondary | ICD-10-CM | POA: Diagnosis not present

## 2021-01-02 DIAGNOSIS — K5900 Constipation, unspecified: Secondary | ICD-10-CM | POA: Diagnosis not present

## 2021-01-02 DIAGNOSIS — Z008 Encounter for other general examination: Secondary | ICD-10-CM | POA: Diagnosis not present

## 2021-01-02 DIAGNOSIS — K7290 Hepatic failure, unspecified without coma: Secondary | ICD-10-CM | POA: Diagnosis not present

## 2021-01-02 DIAGNOSIS — R609 Edema, unspecified: Secondary | ICD-10-CM

## 2021-01-02 DIAGNOSIS — M25475 Effusion, left foot: Secondary | ICD-10-CM

## 2021-01-02 DIAGNOSIS — K7460 Unspecified cirrhosis of liver: Secondary | ICD-10-CM | POA: Diagnosis not present

## 2021-01-02 DIAGNOSIS — I739 Peripheral vascular disease, unspecified: Secondary | ICD-10-CM | POA: Diagnosis not present

## 2021-01-02 DIAGNOSIS — E669 Obesity, unspecified: Secondary | ICD-10-CM | POA: Diagnosis not present

## 2021-01-02 DIAGNOSIS — R69 Illness, unspecified: Secondary | ICD-10-CM | POA: Diagnosis not present

## 2021-01-02 DIAGNOSIS — E261 Secondary hyperaldosteronism: Secondary | ICD-10-CM | POA: Diagnosis not present

## 2021-01-02 DIAGNOSIS — C3490 Malignant neoplasm of unspecified part of unspecified bronchus or lung: Secondary | ICD-10-CM | POA: Diagnosis not present

## 2021-01-02 DIAGNOSIS — K219 Gastro-esophageal reflux disease without esophagitis: Secondary | ICD-10-CM | POA: Diagnosis not present

## 2021-01-03 ENCOUNTER — Inpatient Hospital Stay: Payer: Medicare HMO | Attending: Hematology and Oncology

## 2021-01-03 ENCOUNTER — Other Ambulatory Visit: Payer: Self-pay

## 2021-01-03 ENCOUNTER — Other Ambulatory Visit: Payer: Self-pay | Admitting: Gastroenterology

## 2021-01-03 DIAGNOSIS — D509 Iron deficiency anemia, unspecified: Secondary | ICD-10-CM | POA: Insufficient documentation

## 2021-01-03 DIAGNOSIS — M25475 Effusion, left foot: Secondary | ICD-10-CM

## 2021-01-03 DIAGNOSIS — R609 Edema, unspecified: Secondary | ICD-10-CM

## 2021-01-03 DIAGNOSIS — D500 Iron deficiency anemia secondary to blood loss (chronic): Secondary | ICD-10-CM

## 2021-01-03 DIAGNOSIS — E538 Deficiency of other specified B group vitamins: Secondary | ICD-10-CM | POA: Insufficient documentation

## 2021-01-03 DIAGNOSIS — R635 Abnormal weight gain: Secondary | ICD-10-CM

## 2021-01-03 DIAGNOSIS — M25474 Effusion, right foot: Secondary | ICD-10-CM

## 2021-01-03 LAB — CBC WITH DIFFERENTIAL/PLATELET
Abs Immature Granulocytes: 0.01 10*3/uL (ref 0.00–0.07)
Basophils Absolute: 0.1 10*3/uL (ref 0.0–0.1)
Basophils Relative: 1 %
Eosinophils Absolute: 0.1 10*3/uL (ref 0.0–0.5)
Eosinophils Relative: 2 %
HCT: 30.6 % — ABNORMAL LOW (ref 36.0–46.0)
Hemoglobin: 10.1 g/dL — ABNORMAL LOW (ref 12.0–15.0)
Immature Granulocytes: 0 %
Lymphocytes Relative: 25 %
Lymphs Abs: 1.4 10*3/uL (ref 0.7–4.0)
MCH: 26.2 pg (ref 26.0–34.0)
MCHC: 33 g/dL (ref 30.0–36.0)
MCV: 79.3 fL — ABNORMAL LOW (ref 80.0–100.0)
Monocytes Absolute: 0.6 10*3/uL (ref 0.1–1.0)
Monocytes Relative: 10 %
Neutro Abs: 3.6 10*3/uL (ref 1.7–7.7)
Neutrophils Relative %: 62 %
Platelets: 135 10*3/uL — ABNORMAL LOW (ref 150–400)
RBC: 3.86 MIL/uL — ABNORMAL LOW (ref 3.87–5.11)
RDW: 17.5 % — ABNORMAL HIGH (ref 11.5–15.5)
WBC: 5.7 10*3/uL (ref 4.0–10.5)
nRBC: 0 % (ref 0.0–0.2)

## 2021-01-03 LAB — BASIC METABOLIC PANEL
Anion gap: 8 (ref 5–15)
BUN: 25 mg/dL — ABNORMAL HIGH (ref 8–23)
CO2: 25 mmol/L (ref 22–32)
Calcium: 8.5 mg/dL — ABNORMAL LOW (ref 8.9–10.3)
Chloride: 99 mmol/L (ref 98–111)
Creatinine, Ser: 2.13 mg/dL — ABNORMAL HIGH (ref 0.44–1.00)
GFR, Estimated: 25 mL/min — ABNORMAL LOW (ref >60)
Glucose, Bld: 127 mg/dL — ABNORMAL HIGH (ref 70–99)
Potassium: 3.8 mmol/L (ref 3.5–5.1)
Sodium: 132 mmol/L — ABNORMAL LOW (ref 135–145)

## 2021-01-03 LAB — IRON AND TIBC
Iron: 91 ug/dL (ref 28–170)
Saturation Ratios: 45 % — ABNORMAL HIGH (ref 10.4–31.8)
TIBC: 204 ug/dL — ABNORMAL LOW (ref 250–450)
UIBC: 113 ug/dL

## 2021-01-03 LAB — FERRITIN: Ferritin: 28 ng/mL (ref 11–307)

## 2021-01-03 MED ORDER — SODIUM CHLORIDE 0.9% FLUSH
10.0000 mL | Freq: Once | INTRAVENOUS | Status: AC
  Administered 2021-01-03: 10 mL via INTRAVENOUS
  Filled 2021-01-03: qty 10

## 2021-01-03 MED ORDER — HEPARIN SOD (PORK) LOCK FLUSH 100 UNIT/ML IV SOLN
500.0000 [IU] | Freq: Once | INTRAVENOUS | Status: AC
  Administered 2021-01-03: 500 [IU] via INTRAVENOUS
  Filled 2021-01-03: qty 5

## 2021-01-03 MED ORDER — HEPARIN SOD (PORK) LOCK FLUSH 100 UNIT/ML IV SOLN
INTRAVENOUS | Status: AC
  Filled 2021-01-03: qty 5

## 2021-01-03 NOTE — Progress Notes (Signed)
[facility]  [address] [place], [place] [address] Phone: [phone]  Fax: [phone]   Clinic Day:  01/04/2021  Referring physician: Lorelee Market, MD  Chief Complaint: Crystal [name] is a 70 y.o. female with decompensated cirrhosis s/p TIPS procedure, right sided pulmonary nodules who is seen for 1 month assessment.  HPI: The patient was last seen in the medical oncology clinic on 12/09/2020. At that time, she felt fine. Her right leg abscess had improved. Her legs were swollen. She denied any increased shortness of breath. She received 80 mg IV Lasix and a vitamin B12 injection.  The patient saw [name], NP on 12/16/2020. Her weight was down 10 lbs since her last visit. She had noticed improvement in her abdominal and calf swelling but still had swelling in her thighs. She was taking Lasix 80 mg BID and spironolactone 100 mg BID. She was to reduce Lasix to 80 mg daily with additional 80 mg in the afternoon prn.  During the interim, she has been "good." She is fatigued. She tries to stay as active as possible. She gets a little bit short of breath on exertion. Her diet is stable. She denies bleeding of any kind. The infection in her leg has resolved.   Past Medical History:  Diagnosis Date  . Acute GI bleeding 02/09/2018  . Acute upper gastrointestinal bleeding 08/03/2018  . Anemia    TAKES IRON TAB  . Arthritis    SHOULDER  . B12 deficiency 02/18/2018  . Bleeding    GI  3/19  . Bronchitis    HX OF  . CHF (congestive heart failure) ([place])   . Cirrhosis ([place])   . Colon cancer screening   . Diabetes mellitus without complication ([place])    TYPE 2  . Fatty (change of) liver, not elsewhere classified   . Full dentures    UPPER AND LOWER  . Iron deficiency anemia due to chronic blood loss 02/18/2018  . Lung nodule, multiple   . Pernicious anemia 02/22/2018  . PUD (peptic ulcer disease)   . Raynaud's syndrome   . Upper GI bleeding  08/03/2018    Past Surgical History:  Procedure Laterality Date  . CATARACT EXTRACTION W/PHACO Right 02/06/2018   Procedure: CATARACT EXTRACTION PHACO AND INTRAOCULAR LENS PLACEMENT ([place]) RIGHT DIABETIC;  Surgeon: Leandrew Koyanagi, MD;  Location: [place];  Service: Ophthalmology;  Laterality: Right;  DIABETIC, ORAL MED  . CATARACT EXTRACTION W/PHACO Left 04/30/2018   Procedure: CATARACT EXTRACTION PHACO AND INTRAOCULAR LENS PLACEMENT (I[facility]);  Surgeon: Leandrew Koyanagi, MD;  Location: [facility];  Service: Ophthalmology;  Laterality: Left;  [place] 01:04 AP% 21.3 CDE 13.66 Fluid pack lot # [id number]  . COLONOSCOPY WITH PROPOFOL N/A 03/25/2018   Procedure: COLONOSCOPY WITH PROPOFOL;  Surgeon: Lin Landsman, MD;  Location: [facility] ENDOSCOPY;  Service: Gastroenterology;  Laterality: N/A;  . DENTAL SURGERY     EXTRACTIONS  . ELECTROMAGNETIC NAVIGATION BROCHOSCOPY Right 01/27/2019   Procedure: ELECTROMAGNETIC NAVIGATION BRONCHOSCOPY;  Surgeon: [name], MD;  Location: [facility];  Service: Cardiopulmonary;  Laterality: Right;  . ENTEROSCOPY N/A 07/28/2020   Procedure: Push ENTEROSCOPY;  Surgeon: Lin Landsman, MD;  Location: [facility] ENDOSCOPY;  Service: Gastroenterology;  Laterality: N/A;  . ESOPHAGOGASTRODUODENOSCOPY N/A 08/03/2018   Procedure: ESOPHAGOGASTRODUODENOSCOPY (EGD);  Surgeon: Lin Landsman, MD;  Location: [facility] ENDOSCOPY;  Service: Gastroenterology;  Laterality: N/A;  . ESOPHAGOGASTRODUODENOSCOPY (EGD) WITH PROPOFOL N/A 02/10/2018   Procedure: ESOPHAGOGASTRODUODENOSCOPY (EGD) WITH PROPOFOL;  Surgeon: Vicente Males,  Bailey Mech, MD;  Location: [place];  Service: Gastroenterology;  Laterality: N/A;  . ESOPHAGOGASTRODUODENOSCOPY (EGD) WITH PROPOFOL N/A 03/25/2018   Procedure: ESOPHAGOGASTRODUODENOSCOPY (EGD) WITH PROPOFOL;  Surgeon: Lin Landsman, MD;  Location: [facility] ENDOSCOPY;  Service: Gastroenterology;  Laterality: N/A;  . ESOPHAGOGASTRODUODENOSCOPY (EGD) WITH PROPOFOL  N/A 04/22/2018   Procedure: ESOPHAGOGASTRODUODENOSCOPY (EGD) WITH PROPOFOL with band ligation;  Surgeon: Lin Landsman, MD;  Location: [place];  Service: Gastroenterology;  Laterality: N/A;  . ESOPHAGOGASTRODUODENOSCOPY (EGD) WITH PROPOFOL N/A 06/24/2018   Procedure: ESOPHAGOGASTRODUODENOSCOPY (EGD) WITH PROPOFOL;  Surgeon: Lin Landsman, MD;  Location: [facility] ENDOSCOPY;  Service: Gastroenterology;  Laterality: N/A;  . ESOPHAGOGASTRODUODENOSCOPY (EGD) WITH PROPOFOL N/A 11/19/2018   Procedure: ESOPHAGOGASTRODUODENOSCOPY (EGD) WITH PROPOFOL;  Surgeon: Lin Landsman, MD;  Location: [facility] ENDOSCOPY;  Service: Gastroenterology;  Laterality: N/A;  . ESOPHAGOGASTRODUODENOSCOPY (EGD) WITH PROPOFOL N/A 03/08/2020   Procedure: ESOPHAGOGASTRODUODENOSCOPY (EGD) WITH PROPOFOL;  Surgeon: Lin Landsman, MD;  Location: [facility] ENDOSCOPY;  Service: Gastroenterology;  Laterality: N/A;  . EYE SURGERY    . GIVENS CAPSULE STUDY N/A 03/29/2020   Procedure: GIVENS CAPSULE STUDY;  Surgeon: Lin Landsman, MD;  Location: [facility] ENDOSCOPY;  Service: Gastroenterology;  Laterality: N/A;  . HEMORRHOID BANDING  03/25/2018   Procedure: HEMORRHOID BANDING;  Surgeon: Lin Landsman, MD;  Location: [facility] ENDOSCOPY;  Service: Gastroenterology;;  . IR EMBO ART  VEN HEMORR LYMPH EXTRAV  INC GUIDE ROADMAPPING  08/07/2018  . IR RADIOLOGIST EVAL & MGMT  09/03/2018  . IR RADIOLOGIST EVAL & MGMT  12/17/2018  . IR RADIOLOGIST EVAL & MGMT  06/24/2019  . IR RADIOLOGIST EVAL & MGMT  01/22/2020  . IR TIPS  08/07/2018  . PORTA CATH INSERTION N/A 07/30/2020   Procedure: PORTA CATH INSERTION;  Surgeon: Katha Cabal, MD;  Location: [place] CV LAB;  Service: Cardiovascular;  Laterality: N/A;  . RADIOLOGY WITH ANESTHESIA N/A 08/07/2018   Procedure: TIPS;  Surgeon: Corrie Mckusick, DO;  Location: [place];  Service: Anesthesiology;  Laterality: N/A;  . TONSILLECTOMY      Family History  Problem Relation Age of Onset   . CAD Father   . Heart attack Father 69  . Cancer Maternal Uncle   . Seizures Mother     Social History:  reports that she has never smoked. She has never used smokeless tobacco. She reports previous alcohol use. She reports that she does not use drugs. She retired from [employer] in 11/2017. She lives in [place]. The patient is alone today.  Allergies: No Known Allergies  Current Medications: Current Outpatient Medications  Medication Sig Dispense Refill  . cyanocobalamin (,VITAMIN B-12,) 1000 MCG/ML injection Inject 1,000 mcg into the muscle every 30 (thirty) days.     . furosemide (LASIX) 80 MG tablet Take 78m (one tablet) in the morning daily with an additional 879mtablet as needed for weight gain of 2 pounds overnight or 5 pounds in week. (Patient taking differently: Take 8067mone tablet) in the morning daily and 68m28m night) 180 tablet 1  . hydrOXYzine (ATARAX/VISTARIL) 25 MG tablet Take 25 mg by mouth 3 (three) times daily.    . L[place] KitchenTULOSE PO Take by mouth. Takea 18ml74mmorning takes 18mls69mnight    . Misc. Devices KIT Moderate compression hose 20-30 MM HG 1 kit 0  . ondansetron (ZOFRAN) 4 MG tablet Take 4 mg by mouth every 8 (eight) hours as needed for nausea or vomiting.     . OneTGlory Rosebusha Lancets  33G MISC 2 (two) times daily.    Glory Rosebush ULTRA test strip 2 (two) times daily.    . pantoprazole (PROTONIX) 40 MG tablet Take 40 mg by mouth 2 (two) times daily.    . Potassium Chloride Crys ER (KLOR-CON M10 PO) Take 15 mg by mouth daily. Takes 1 1/2 tablet once a day    . spironolactone (ALDACTONE) 100 MG tablet TAKE 1 TABLET BY MOUTH TWICE A DAY 60 tablet 0  . XIFAXAN 550 MG TABS tablet Take 550 mg by mouth 2 (two) times daily.    . Cholecalciferol (VITAMIN D3) 1.25 MG (50000 UT) CAPS Take 1 capsule by mouth once a week.  (Patient not taking: Reported on 01/04/2021)    . nystatin (MYCOSTATIN/NYSTOP) powder Apply 1 g topically daily as needed (rash). (Patient not taking:  Reported on 01/04/2021)     No current facility-administered medications for this visit.   Facility-Administered Medications Ordered in Other Visits  Medication Dose Route Frequency Provider Last Rate Last Admin  . sodium chloride flush (NS) 0.9 % injection 10 mL  10 mL Intravenous PRN Nolon Stalls C, MD   10 mL at 09/14/20 1320    Review of Systems  Constitutional: Positive for malaise/fatigue and weight loss (17 lbs). Negative for chills, diaphoresis and fever.       Feels "good."  HENT: Negative for congestion, ear discharge, ear pain, hearing loss, nosebleeds, sinus pain, sore throat and tinnitus.   Eyes: Negative for blurred vision and double vision.  Respiratory: Positive for shortness of breath (on exertion). Negative for cough, hemoptysis and sputum production.   Cardiovascular: Positive for leg swelling. Negative for chest pain and palpitations.  Gastrointestinal: Negative for abdominal pain, blood in stool, constipation, diarrhea, heartburn, melena, nausea and vomiting.  Genitourinary: Negative for dysuria, frequency, hematuria and urgency.  Musculoskeletal: Negative for back pain, joint pain, myalgias and neck pain.  Skin: Negative for itching and rash.       Raynaud's.  Neurological: Negative for dizziness, tingling, sensory change, weakness and headaches.  Endo/Heme/Allergies: Does not bruise/bleed easily.       Diabetes, no meds  Psychiatric/Behavioral: Negative for depression and memory loss. The patient is not nervous/anxious and does not have insomnia.   All other systems reviewed and are negative.  Performance status (ECOG): 1  Vital Signs Blood pressure (!) 112/57, pulse 64, temperature (!) 96.2 F (35.7 C), temperature source Tympanic, weight 187 lb 9.8 oz (85.1 kg).   Physical Exam Vitals and nursing note reviewed.  Constitutional:      General: She is not in acute distress.    Appearance: Normal appearance. She is well-developed. She is not diaphoretic.      Interventions: Face mask in place.  HENT:     Head: Normocephalic and atraumatic.     Comments: Short white hair.    Mouth/Throat:     Mouth: Mucous membranes are moist.     Pharynx: Oropharynx is clear.  Eyes:     General: No scleral icterus.    Extraocular Movements: Extraocular movements intact.     Conjunctiva/sclera: Conjunctivae normal.     Pupils: Pupils are equal, round, and reactive to light.     Comments: Glasses. Blue eyes. Scleral hemorrhage in left eye.  Neck:     Vascular: No JVD.  Cardiovascular:     Rate and Rhythm: Normal rate and regular rhythm.     Heart sounds: Normal heart sounds. No murmur heard. No friction rub. No gallop.  Pulmonary:     Effort: Pulmonary effort is normal. No respiratory distress.     Breath sounds: Normal breath sounds. No wheezing, rhonchi or rales.  Chest:     Chest wall: No tenderness.  Breasts:     Right: No axillary adenopathy or supraclavicular adenopathy.     Left: No axillary adenopathy or supraclavicular adenopathy.    Abdominal:     General: Bowel sounds are normal. There is no distension.     Palpations: Abdomen is soft. There is no mass.     Tenderness: There is no abdominal tenderness. There is no guarding or rebound.  Musculoskeletal:        General: No swelling or tenderness. Normal range of motion.     Cervical back: Normal range of motion and neck supple.     Right lower leg: Edema (chronic) present.     Left lower leg: Edema (chronic) present.  Lymphadenopathy:     Head:     Right side of head: No preauricular, posterior auricular or occipital adenopathy.     Left side of head: No preauricular, posterior auricular or occipital adenopathy.     Cervical: No cervical adenopathy.     Upper Body:     Right upper body: No supraclavicular or axillary adenopathy.     Left upper body: No supraclavicular or axillary adenopathy.     Lower Body: No right inguinal adenopathy. No left inguinal adenopathy.  Skin:     General: Skin is warm and dry.     Coloration: Skin is not pale.     Findings: No erythema.  Neurological:     Mental Status: She is alert and oriented to person, place, and time.  Psychiatric:        Behavior: Behavior normal.        Thought Content: Thought content normal.        Judgment: Judgment normal.    Infusion on 01/03/2021  Component Date Value Ref Range Status  . Sodium 01/03/2021 132* 135 - 145 mmol/L Final  . Potassium 01/03/2021 3.8  3.5 - 5.1 mmol/L Final  . Chloride 01/03/2021 99  98 - 111 mmol/L Final  . CO2 01/03/2021 25  22 - 32 mmol/L Final  . Glucose, Bld 01/03/2021 127* 70 - 99 mg/dL Final   Glucose reference range applies only to samples taken after fasting for at least 8 hours.  . BUN 01/03/2021 25* 8 - 23 mg/dL Final  . Creatinine, Ser 01/03/2021 2.13* 0.44 - 1.00 mg/dL Final  . Calcium 01/03/2021 8.5* 8.9 - 10.3 mg/dL Final  . GFR, Estimated 01/03/2021 25* >60 mL/min Final   Comment: (NOTE) Calculated using the CKD-EPI Creatinine Equation (2021)   . Anion gap 01/03/2021 8  5 - 15 Final   Performed at [facility], [address], [place], [place] [address]  . WBC 01/03/2021 5.7  4.0 - 10.5 K/uL Final  . RBC 01/03/2021 3.86* 3.87 - 5.11 MIL/uL Final  . Hemoglobin 01/03/2021 10.1* 12.0 - 15.0 g/dL Final  . HCT 01/03/2021 30.6* 36.0 - 46.0 % Final  . MCV 01/03/2021 79.3* 80.0 - 100.0 fL Final  . MCH 01/03/2021 26.2  26.0 - 34.0 pg Final  . MCHC 01/03/2021 33.0  30.0 - 36.0 g/dL Final  . RDW 01/03/2021 17.5* 11.5 - 15.5 % Final  . Platelets 01/03/2021 135* 150 - 400 K/uL Final  . nRBC 01/03/2021 0.0  0.0 - 0.2 % Final  . Neutrophils Relative % 01/03/2021 62  %  Final  . Neutro Abs 01/03/2021 3.6  1.7 - 7.7 K/uL Final  . Lymphocytes Relative 01/03/2021 25  % Final  . Lymphs Abs 01/03/2021 1.4  0.7 - 4.0 K/uL Final  . Monocytes Relative 01/03/2021 10  % Final  . Monocytes Absolute 01/03/2021 0.6  0.1 - 1.0 K/uL Final  . Eosinophils Relative  01/03/2021 2  % Final  . Eosinophils Absolute 01/03/2021 0.1  0.0 - 0.5 K/uL Final  . Basophils Relative 01/03/2021 1  % Final  . Basophils Absolute 01/03/2021 0.1  0.0 - 0.1 K/uL Final  . Immature Granulocytes 01/03/2021 0  % Final  . Abs Immature Granulocytes 01/03/2021 0.01  0.00 - 0.07 K/uL Final   Performed at [facility], [address] [place], [place] [address]  . Iron 01/03/2021 91  28 - 170 ug/dL Final  . TIBC 01/03/2021 204* 250 - 450 ug/dL Final  . Saturation Ratios 01/03/2021 45* 10.4 - 31.8 % Final  . UIBC 01/03/2021 113  ug/dL Final   Performed at [facility], [address] [place], [place] [address]  . Ferritin 01/03/2021 28  11 - 307 ng/mL Final   Performed at [facility], [address] [place], [place] [address]    Assessment:  EPHRATA VERVILLE is a 70 y.o. female with a right lower lobe pulmonary nodule. She denies any smoking history.  Abdomen and pelvic CTon 02/09/2018 revealed an 1.8 cm spiculated density in right lower lobe concerning for malignancy. There was mildly nodular hepatic contourconcerning for hepatic cirrhosis. There was moderate splenomegalysuggesting portal venous hypertension. Mild ascites was noted. There were mildly enlarged retroperitoneal lymph nodes (largest 1.1 cm) concerning for possible metastatic disease or malignancy. There was a 7 cm left ovarian cyst. Further evaluation with MRI was recommended to evaluate for possible neoplasm. CA125was 351.3 and CEA1.1 on 02/21/2018.   Chest CTon 02/25/2018 revealed a 2.3 x 1.5 x 1.0 cm perifissural nodulewith spiculated margins in the right middle lobe. There was a 1.5 x 1.5 x 1.4 cm right lower lobe spiculated nodule. There was a moderate left sided pleural effusion.   PET scanon 03/08/2018 revealed a 2.5 x 1.8 cm sub solid nodular opacity in theRML(SUV 2.7) and a 1.6 cm roundedslightly spiculated RLL pulmonary nodule(SUV 2.1). There were no  enlarged or hypermetabolic mediastinal or hilar lymph nodes. There was a small left pleural effusion. 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  Chest CTon 05/15/2018 revealed no significant changes in the 2 previously noted nodules in the RIGHT lung. Irregular RML lesion measures 2.4 x 1.4 cm (previously 2.3 x 1.5 cm). Spiculated central RLL lesion measures 1.8 x 1.4 cm (previously 1.6 x 1.5 cm). Both areas remain concerning for neoplasm. There was interval decrease in ascites volume overall. Upper abdominal lymphadenopathy noted that was felt to be reactive due to underlying liver disease.   Chest CTon 08/15/2018 revealed the spiculated right lower lobe lung lesion appeared slightly increased and was not significantly changed in size compared with previous exam. Morphologically, this was worrisome for a small bronchogenic neoplasm. The right middle lobe lung nodule was slightly decreased in size in the interval and may be post infectious or inflammatory in etiology.  Super D chest CTon 01/24/2019 revealed a 3.2 x 2.3 cm ill-defined anterior right lung nodule compared to 2.9 x 2.3 cm previously. Minor fissurewasnot well, and as such, this nodule can not be confidently localized to the right upper  or middle lobe. The right lower lobe nodule measured2.0 x 2.0 cm comparedto 2.0 x 1.9 cm.  Chest CTon 08/07/2019 revealed no significant interval change in size right middle lobe and right lower lobe lung nodules.Liverwascompatible with cirrhosiss/pTIPS.Chest CTon 02/02/2020 revealed stable nodules within the right middle and right lower lobes.Therewerecirrhotic changesof the liver with TIPS shunt in place. There was mild prominence of the ascending aorta to 4 cm. Recommendedannual imaging followup by CTA or MRA.  She was admitted to [facility] from 02/09/2018 - 04/02/2019with a variceal hemorrhage.  EGDon 02/10/2018 revealed 5 columns of grade III esophageal varices in the lower third of the esophagus. There was stigmata of recent bleeding and red wale signs. She underwent variceal ligation x 10. She required 3 units of PRBCs.  EGDon 03/25/2018 revealed 2 angiectasias(non-bleeding) in the second portion of the duodenum, and a few diminutive (non-bleeding) angiectasias in the prepyloric region of the stomach that were treated APC. There were 4 non-bleeding ulcers ([place] Class III) found in the gastric fundus. There were 4 columns of large non-bleeding varicesin the lower third of the esophagus, of which demonstrated no stigmata of bleeding. Varices were banded. EGD on 04/22/2018 revealed one non-bleeding cratered gastric ulcer([place] Class III) in the lesser curvature of the gastric body. Duodenal bulb and second portion of the duodenum were normal. Moderate portal hypertensive gastropathy noted in the stomach. Large varices in the lower third of the esophagus noted. 2 bands were placed with incomplete eradication of the lesions. EGD on 03/08/2020 revealed a few non-bleeding angioectasias in the duodenum and a gastric antral vascular ectasia with bleeding treated with argon plasma coagulation (APC).  There was a single non-bleeding angioectasia in the stomach treated with bipolar cautery. Gastroesophageal junction and esophagus were normal. No specimens were collected.  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  Colonoscopyon 03/25/2018 revealed a single 5 mm sessile polyp in the ascending colon. Patient has rectal varices and external hemorrhoids. Pathology returned as tubular adenoma, and was negative for high grade dysplasia and malignancy.   She has  iron deficiency anemia. Ferritinwas 6. She is on ferrous sulfate 325 mg TID. She hasB12 deficiency. B12was 103. Intrinsic factor antibody was negative. 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 She began B12 injections on 03/06/2018 (last 12/09/2020). Folate was19.2on06/06/2020. Dietis good.  She received Venoferweekly x 4 (07/12/2018 - 07/31/2018), x 3 (12/22/2019 -01/05/2020), x 3 (03/30/2020 - 04/13/2020), 07/08/2020, 07/21/2020, and x 3 (08/05/2020 - 08/19/2020).  Ferritinhas been followed: 6 on 02/09/2018, 16 on 05/24/2018, 13 on 07/10/2018, 374 on 08/15/2018, 485 on 08/21/2018, 114 on 09/25/2018, 63 on 11/20/2018, 35 on 02/12/2019, 22 on 04/24/2019, 41 on 09/22/2019, 41 on 10/27/2019, 21 on 12/15/2019, 22 on 03/22/2020, 20 on 03/26/2020, 271 on 04/14/2020, 57 on 05/24/2020, 23 on 06/30/2020, 26 on 08/05/2020, 74 on 09/14/2020, 42 on 11/09/2020, and 28 on 01/03/2021.  She has hepatitis C antibodies. HCV RNA was not detected. She has been exposed to hepatitis B. Anti-smooth muscle antibodiesare weakly positive. ANAwas positive (>1:1280 centromere antibody). Negative studiesincluded: ceruloplasmin, anti-mitochondrial antibodies, hepatitis B IgM, HIV, and alpha-1 antitrypsin. PT was 15.2 (INR 1.21). AFPwas 1.5 on 05/28/2018,2.4 on 11/20/2018, 2.0 on 04/24/2019, and 1.8 on 09/14/2020.  She has a history of decompensated cirrhosiss/p TIPSprocedure 08/07/2018. She has portal hypertension and a history of recurrent bleeding esophageal varices.She  was given a 2-year life expectancyfollowing her TIPS procedure. Abdominal/pelvic arterial/venous ultrasound doppleron 04/24/2019 revealed a patent TIPS and no evidence of persisting ascites.  Abdominal MRI including MRCPon 11/12/2019 revealed cirrhosis with TIPS in place.There was no biliary duct dilatation.There was cholelithiasis without acute cholecystitis. Right hepatic lobe 9 mm  lesion whichwas indeterminate, LR 3.This was possibly present on the prior CT of 2019. Recommendation includedrepeat pre and post contrast abdominal MRI at 3-6 months. There as small volume abdominal ascites.The right lower lobe pulmonary nodulewas suboptimally evaluated.  [facility] with[facility] mental status.HeadCT revealedno acute intracranial pathology. Head MRIwo contrast showed symmetric FLAIR hyperintensity within the bilateral mamillary bodies. This finding may be seen in the setting of Wernicke encephalopathy.There was no evidence of acute infarct and mild chronic small vessels with ischemic disease.Her mental status returned to baseline.  RUQ ultrasoundon 05/29/2018 revealed cholelithiasis. There was a thickened edematous gallbladder wall. The wall thickening could be due to hypoproteinemia/hypoalbuminemia or cholecystitis. Negative sonographic Murphy's sign. There was a heterogeneous slightly nodular liver consistent with cirrhosis.   Renal ultrasound on 06/15/2020 revealed marked splenomegaly (13.9 x 15.2 x 5.6 cm; 618 ml) similar to comparison study of 05/28/2020 and MR of the abdomen on 11/12/2019.  Pelvic ultrasoundon 03/18/2018 demonstrated a simple cyst in the LEFT ovarymeasuring 6.1 x 4.5 x 6.6 cm. Repeat imaging recommended in 1 year to document stability.  Patient declined follow-up.  The patient received the [place] COVID-19 vaccine on 12/26/2019 and 01/21/2020.  Symptomatically, she is fatigued. She gets a little bit short of breath on exertion. Her diet is stable. She denies bleeding of any kind. Exam is stable.  Plan: 1.  Review labs from 01/03/2021. 2.Decompensated cirrhosis s/p TIPS Clinically,she continues to do fair. Life expectancy is limited s/pTIPS on 08/07/2018 (2 years). She denies any bleeding. AFPwas 1.8 on 09/14/2020. Continue liver imaging every 6 months for [place] surveillance (last  05/28/2020).   Patient to continue follow-up with GI on a regular basis. 3.Pulmonary nodules Chest CTon 02/02/2020 revealed stable nodules within the right middle and right lower lobes.  Schedule chest CT in 01/2021. 4.Iron deficiency anemia Hematocrit 30.6. Hemoglobin 10.1.  MCV 79.3 on 01/03/2021.  Ferritin 28 with an iron saturation of 45% and a TIBC 204. Continue to monitor. 5.B12 deficiency Patient receives B12 monthly (last01/27/2022) B12 today and monthly x 6. 6.   Venofer today and weekly x 1 (total 2). 7.   RN: Nurse to call cardiology, [name], NP (re: IV Lasix needs) 8.   RTC in 2 months for labs (CBC, ferritin, iron studies) and B12. 9.   RTC in 4 months for MD assess, labs (CBC with diff, ferritin, iron studies-day before) and +/- Venofer.  I discussed the assessment and treatment plan with the patient.  The patient was provided an opportunity to ask questions and all were answered.  The patient agreed with the plan and demonstrated an understanding of the instructions.  The patient was advised to call back if the symptoms worsen or if the condition fails to improve as anticipated.   Lequita Asal, MD, PhD    01/04/2021, 2:02 PM  I, Mirian Mo Tufford, am acting as Education administrator for [employer]. Mike Gip, MD, PhD.  I, Eugune Sine C. Mike Gip, MD, have reviewed the above documentation for accuracy and completeness, and I agree with the above.

## 2021-01-04 ENCOUNTER — Telehealth: Payer: Self-pay | Admitting: *Deleted

## 2021-01-04 ENCOUNTER — Other Ambulatory Visit: Payer: Self-pay

## 2021-01-04 ENCOUNTER — Telehealth: Payer: Self-pay

## 2021-01-04 ENCOUNTER — Encounter: Payer: Self-pay | Admitting: Hematology and Oncology

## 2021-01-04 ENCOUNTER — Inpatient Hospital Stay (HOSPITAL_BASED_OUTPATIENT_CLINIC_OR_DEPARTMENT_OTHER): Payer: Medicare HMO | Admitting: Hematology and Oncology

## 2021-01-04 ENCOUNTER — Inpatient Hospital Stay: Payer: Medicare HMO

## 2021-01-04 VITALS — BP 112/57 | HR 64 | Temp 96.2°F | Wt 187.6 lb

## 2021-01-04 VITALS — BP 122/62 | HR 61

## 2021-01-04 DIAGNOSIS — R911 Solitary pulmonary nodule: Secondary | ICD-10-CM | POA: Diagnosis not present

## 2021-01-04 DIAGNOSIS — R609 Edema, unspecified: Secondary | ICD-10-CM

## 2021-01-04 DIAGNOSIS — Z95828 Presence of other vascular implants and grafts: Secondary | ICD-10-CM

## 2021-01-04 DIAGNOSIS — E538 Deficiency of other specified B group vitamins: Secondary | ICD-10-CM

## 2021-01-04 DIAGNOSIS — D500 Iron deficiency anemia secondary to blood loss (chronic): Secondary | ICD-10-CM

## 2021-01-04 DIAGNOSIS — M25474 Effusion, right foot: Secondary | ICD-10-CM

## 2021-01-04 DIAGNOSIS — R635 Abnormal weight gain: Secondary | ICD-10-CM

## 2021-01-04 DIAGNOSIS — M25475 Effusion, left foot: Secondary | ICD-10-CM

## 2021-01-04 DIAGNOSIS — R918 Other nonspecific abnormal finding of lung field: Secondary | ICD-10-CM

## 2021-01-04 DIAGNOSIS — D509 Iron deficiency anemia, unspecified: Secondary | ICD-10-CM | POA: Diagnosis not present

## 2021-01-04 MED ORDER — HEPARIN SOD (PORK) LOCK FLUSH 100 UNIT/ML IV SOLN
INTRAVENOUS | Status: AC
  Filled 2021-01-04: qty 5

## 2021-01-04 MED ORDER — CYANOCOBALAMIN 1000 MCG/ML IJ SOLN
1000.0000 ug | Freq: Once | INTRAMUSCULAR | Status: AC
  Administered 2021-01-04: 1000 ug via INTRAMUSCULAR
  Filled 2021-01-04: qty 1

## 2021-01-04 MED ORDER — SODIUM CHLORIDE 0.9 % IV SOLN
Freq: Once | INTRAVENOUS | Status: AC
  Filled 2021-01-04: qty 250

## 2021-01-04 MED ORDER — HEPARIN SOD (PORK) LOCK FLUSH 100 UNIT/ML IV SOLN
500.0000 [IU] | Freq: Once | INTRAVENOUS | Status: AC
  Administered 2021-01-04: 500 [IU] via INTRAVENOUS
  Filled 2021-01-04: qty 5

## 2021-01-04 MED ORDER — IRON SUCROSE 20 MG/ML IV SOLN
200.0000 mg | Freq: Once | INTRAVENOUS | Status: AC
  Administered 2021-01-04: 200 mg via INTRAVENOUS
  Filled 2021-01-04: qty 10

## 2021-01-04 MED ORDER — SODIUM CHLORIDE 0.9% FLUSH
10.0000 mL | INTRAVENOUS | Status: DC | PRN
  Filled 2021-01-04: qty 10

## 2021-01-04 NOTE — Telephone Encounter (Signed)
Spoke to pt. Notified of recommendation. She stopped afternoon Lasix for the last 3 days.  But prior to this she reports taking Lasix 80 mg in AM and 9m in PM every day.  Her weight was going down and was down to 182. (Her baseline is 180 per pt).  But at appt today she said she back up 4 lbs since Sunday.  She does have appt with Dr. LHolley Raringnext month but confirms that she is going to call their office to see if she can get in sooner if needed.

## 2021-01-04 NOTE — Telephone Encounter (Signed)
-----  Message from Loel Dubonnet, NP sent at 01/04/2021  1:19 PM EST ----- At last clinic visit we reduced Miss Un's Lasix due to kidney dysfunction. Her repeat kidney lab work with Dr. Mike Gip yesterday shows kidney function is stable compared to previous lab draw though still diminished compared to her baseline. I recommend she follow up with Dr. Holley Raring of nephrology for further evaluation. I have routed the lab work to his office already. Jinny Blossom, if you will please call her and relay this recommendation that would be much appreciated!  I have CC'd Dr. Mike Gip just as an FYI that we have followed up on the BMP - thank you so much for collecting!  Best, Loel Dubonnet, NP

## 2021-01-04 NOTE — Telephone Encounter (Signed)
Attempted to call pt to discuss provider's recc.  No answer. Lmtcb.

## 2021-01-04 NOTE — Telephone Encounter (Signed)
Per cardiologist, [name] regarding IV lasix: Her renal function was stable compared to most recent labs but is overall declined compared to her baseline. At her most recent clinic visit with me I actually reduced her Lasix to daily with additional dose in the afternoon as needed for fluid/edema. I asked my nurse to call her to relay those results and encourage [name] to call nephrology. If she is not volume overloaded today I do not think she needs IV Lasix today. She has another infusion 01/12/21 and I can have my nurse contact her prior to that appt and if weight is up then we will plan to give IV Lasix at that time and I can send orders.

## 2021-01-05 NOTE — Telephone Encounter (Signed)
Thank you for the update! RecommendeLasix 78m daily with additional 84min the afternoon as needed for weight gain of 2 pounds overnight or 5 pounds in one week. These recommendations were discussed during her most recent clinic visit. No need for IV Lasix at this time.   Agree with recommendation to call Dr. LaElwyn Ladeffice.  Thank you for following up with her and the helpful information! CaLoel DubonnetNP

## 2021-01-06 MED ORDER — FUROSEMIDE 80 MG PO TABS: ORAL_TABLET | ORAL | 1 refills | Status: DC

## 2021-01-06 NOTE — Telephone Encounter (Signed)
Spoke to pt, notified of recc to proceed taking Lasix 23m daily with additional 852min the afternoon AS NEEDED for weight gain of 2 lbs overnight of 5 lbs in one week.

## 2021-01-10 ENCOUNTER — Other Ambulatory Visit: Payer: Self-pay

## 2021-01-10 ENCOUNTER — Ambulatory Visit (INDEPENDENT_AMBULATORY_CARE_PROVIDER_SITE_OTHER): Payer: Medicare HMO | Admitting: Gastroenterology

## 2021-01-10 ENCOUNTER — Encounter: Payer: Self-pay | Admitting: Gastroenterology

## 2021-01-10 VITALS — BP 124/77 | HR 74 | Temp 97.7°F | Ht 62.0 in | Wt 188.1 lb

## 2021-01-10 DIAGNOSIS — H524 Presbyopia: Secondary | ICD-10-CM | POA: Diagnosis not present

## 2021-01-10 DIAGNOSIS — M25474 Effusion, right foot: Secondary | ICD-10-CM

## 2021-01-10 DIAGNOSIS — M25471 Effusion, right ankle: Secondary | ICD-10-CM

## 2021-01-10 DIAGNOSIS — K7290 Hepatic failure, unspecified without coma: Secondary | ICD-10-CM | POA: Diagnosis not present

## 2021-01-10 DIAGNOSIS — K7460 Unspecified cirrhosis of liver: Secondary | ICD-10-CM | POA: Diagnosis not present

## 2021-01-10 DIAGNOSIS — M25472 Effusion, left ankle: Secondary | ICD-10-CM | POA: Diagnosis not present

## 2021-01-10 DIAGNOSIS — M25475 Effusion, left foot: Secondary | ICD-10-CM

## 2021-01-10 NOTE — Patient Instructions (Addendum)
Your Ultrasound is scheduled for 01/20/2021 at [facility]. Please arrive at 9:15am for a 9:30am scan. Nothing to eat or drink after midnight.  If you need to rescheduled the number is [phone] opton 3 and then option 2.

## 2021-01-10 NOTE — Progress Notes (Signed)
[name], MD [address]  [place]  [place], [place] [address]  Main: [phone]  Fax: [phone]    Gastroenterology Consultation  Referring Provider:     Martin Majestic, * Primary Care Physician:  Martin Majestic, FNP Primary Gastroenterologist:  Dr. [name] Reason for Consultation:     Decompensated cirrhosis, swelling of legs        HPI:   Crystal [name] is a 70 y.o. female referred by Dr. Martin Majestic, FNP  for consultation & management of decompensated cirrhosis.   Follow-up visit 06/26/2019 Patient was told to make a follow-up to see me to discuss about abnormal labs by her PCP.  I reviewed the labs from 06/12/2019 which revealed normal hemoglobin, platelets 110, normal BUN/creatinine, total bilirubin 2, alkaline phosphatase 170.  She had repeat labs at Dr. Pasty Arch office in [place] on 8/3, alkaline phosphatase 173, AST 60, total bilirubin 1.5, creatinine 1.18.  Patient reports she has been doing very well.  She denies any swelling of legs, dark urine, pruritus, abdominal pain, melena.  She feels she has remained the same from liver standpoint.  She is requesting lactulose for 3 months supply  She is single, lives with a roommate  Follow-up visit 05/03/2020 Patient was recently admitted to [facility] on 6/12 secondary to increasing fatigue.  Her hemoglobin A1c was low and she had hypoglycemic episode.  Therefore, antidiabetic medications were discontinued.  Her hemoglobin was 8.1 during that admission.  Patient is here for follow-up of decompensated cirrhosis.  She reports increasing fatigue.  She says she has not been able to perform her daily activities like she used to do before.  She reports mild swelling of her feet.  She denies any weight gain.She denies fever, chills, abdominal pain, nausea or vomiting, hematemesis, rectal bleeding, hematochezia.  She denies black stools or melena.  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  Subsequently, underwent video capsule endoscopy which was unremarkable.  She does have progressively worsening anemia without active GI bleed. She is compliant with her diuretics, lactulose, rifaximin.  She is also noted to have declining renal function. She does report nocturnal pruritus for which she takes hydroxyzine 10 mg every 5 hours at night  Follow-up visit 06/01/2020 Patient made an urgent visit secondary to volume overload within last 4 weeks.  She gained more than 20 pounds in last few weeks.  She noticed significant swelling of legs extending to upper thighs as well as exertional dyspnea.  She reports that she has been taking diuretics however her renal function was gradually getting worse.  Therefore I held diuretics.  She also acknowledges that she eats out almost daily.  She does eat hamburger with pickle on a regular basis as well.  She went to ER last week.  Labs revealed potassium 3.4, slightly improved creatinine 1.6 from 1.7, normal BUN, hemoglobin 8.6 which is at baseline, elevated BNP 464.9, TSH normal She denies abdominal pain, fever, chills, nausea or vomiting.  Diuretics were restarted, Lasix increased to 60 mg daily along with spironolactone 50 mg daily  Follow-up visit 07/13/2020 Patient continues to lose weight on current dose of diuretics.  She is maintaining low-sodium diet.  She feels loopy she ran out of lactulose.  She is currently on rifaximin twice daily.  Her hemoglobin is also downtrending with worsening iron deficiency.  Latest hemoglobin is 8.1, received IV iron last Wednesday and scheduled for second dose next week.  She denies black stools.  Does report her stools alternating between yellow and dark.  Follow-up visit 12/07/2020 Patient reports significant weight gain in last several weeks, she gained about 15 pounds with severe swelling of both legs extending all the way to the lower abdomen.  She reports that she has been taking Lasix 80 mg  and spironolactone 100 mg.  Patient went to ER twice secondary to significant pedal edema, labs were fairly unremarkable, she was given IV Lasix 60 mg x 1 dose in the ER.  Patient reports that this has resulted in more urine output.  However, she does not notice a big change on Lasix 80 mg.  She has been taking lactulose.  She denies any abdominal pain, nausea, vomiting, fever.  Follow-up visit 01/02/2021 Patient lost about 8 pounds since last visit.  She is currently on Lasix 80 mg 1-2 times daily along with spironolactone 200 mg daily.  Patient is evaluated by cardiology who also recommended IV Lasix via port as needed.  Patient reports that her swelling of legs has significantly improved but still there.  She denies any abdominal distention.  She is compliant with rest of the medications.  She does not have any concerns today.  She is also receiving IV iron as needed for chronic iron deficiency anemia.  Her hemoglobin has remained stable around 10  NSAIDs: none including BC powder or Goody powder  Antiplts/Anticoagulants/Anti thrombotics: none  GI Procedures:  Video capsule endoscopy 03/29/2020 Capsule study results:  Non specific no bleeding tiny erythematous spots in small bowel. If patient develops worse anemia recommend a push enteroscopy   EGD 03/08/2020 - A few non-bleeding angioectasias in the duodenum. Treated with argon plasma coagulation (APC). - Gastric antral vascular ectasia with bleeding. Treated with argon plasma coagulation (APC). - A single non-bleeding angioectasia in the stomach. Treated with bipolar cautery. - Normal gastroesophageal junction and esophagus. - No specimens collected.  EGD 11/19/2018 - Normal duodenal bulb and second portion of the duodenum. - Gastric antral vascular ectasia with bleeding. Treated with argon plasma coagulation (APC). - Normal gastroesophageal junction and esophagus. - No specimens collected.  EGD 08/03/2018 - Normal duodenal bulb and  second portion of the duodenum. - Portal hypertensive gastropathy. - Bleeding large (> 5 mm) esophageal varices, ligation was unsuccessful. - No specimens collected.  EGD 04/22/2018 - Normal duodenal bulb and second portion of the duodenum. - Non-bleeding gastric ulcer with a clean ulcer base (Forrest Class III). - Portal hypertensive gastropathy. - Large (> 5 mm) esophageal varices. Incompletely eradicated. Banded x 2. - No specimens collected.  EGD 03/25/2018 - Two non-bleeding angioectasias in the duodenum. Treated with argon plasma coagulation (APC). - A few non-bleeding angioectasias in the stomach. Treated with argon plasma coagulation (APC). - Non-bleeding gastric ulcers with a clean ulcer base (Forrest Class III). - Non-bleeding large (> 5 mm) esophageal varices. Incompletely eradicated. Banded. - No specimens collected.   Colonoscopy 03/25/2018 - One 5 mm polyp in the ascending colon, removed with a cold snare. Resected and retrieved. - Rectal varices. - External hemorrhoids. - The examination was otherwise normal.  DIAGNOSIS:  A. COLON POLYP, ASCENDING; COLD SNARE:  - TUBULAR ADENOMA.  - NEGATIVE FOR HIGH-GRADE DYSPLASIA AND MALIGNANCY.  EGD 02/09/18 The examined duodenum was normal. The stomach was normal. Five columns of non-bleeding grade III varices were found in the lower third of the esophagus,. Stigmata of recent bleeding were evident and red wale signs were present. Ten bands were successfully placed with  incomplete eradication of varices. There was no bleeding during, and at the end, of the procedure.  Past Medical History:  Diagnosis Date  . Acute GI bleeding 02/09/2018  . Acute upper gastrointestinal bleeding 08/03/2018  . Anemia    TAKES IRON TAB  . Arthritis    SHOULDER  . B12 deficiency 02/18/2018  . Bleeding    GI  3/19  . Bronchitis    HX OF  . CHF (congestive heart failure) ([place])   . Cirrhosis ([place])   . Colon cancer screening   . Diabetes  mellitus without complication ([place])    TYPE 2  . Fatty (change of) liver, not elsewhere classified   . Full dentures    UPPER AND LOWER  . Iron deficiency anemia due to chronic blood loss 02/18/2018  . Lung nodule, multiple   . Pernicious anemia 02/22/2018  . PUD (peptic ulcer disease)   . Raynaud's syndrome   . Upper GI bleeding 08/03/2018    Past Surgical History:  Procedure Laterality Date  . CATARACT EXTRACTION W/PHACO Right 02/06/2018   Procedure: CATARACT EXTRACTION PHACO AND INTRAOCULAR LENS PLACEMENT ([place]) RIGHT DIABETIC;  Surgeon: Leandrew Koyanagi, MD;  Location: [facility];  Service: Ophthalmology;  Laterality: Right;  DIABETIC, ORAL MED  . CATARACT EXTRACTION W/PHACO Left 04/30/2018   Procedure: CATARACT EXTRACTION PHACO AND INTRAOCULAR LENS PLACEMENT ([facility]);  Surgeon: Leandrew Koyanagi, MD;  Location: [facility];  Service: Ophthalmology;  Laterality: Left;  [place] 01:04 AP% 21.3 CDE 13.66 Fluid pack lot # [id number]  . COLONOSCOPY WITH PROPOFOL N/A 03/25/2018   Procedure: COLONOSCOPY WITH PROPOFOL;  Surgeon: Lin Landsman, MD;  Location: [facility] ENDOSCOPY;  Service: Gastroenterology;  Laterality: N/A;  . DENTAL SURGERY     EXTRACTIONS  . ELECTROMAGNETIC NAVIGATION BROCHOSCOPY Right 01/27/2019   Procedure: ELECTROMAGNETIC NAVIGATION BRONCHOSCOPY;  Surgeon: [name], MD;  Location: [facility];  Service: Cardiopulmonary;  Laterality: Right;  . ENTEROSCOPY N/A 07/28/2020   Procedure: Push ENTEROSCOPY;  Surgeon: Lin Landsman, MD;  Location: [facility] ENDOSCOPY;  Service: Gastroenterology;  Laterality: N/A;  . ESOPHAGOGASTRODUODENOSCOPY N/A 08/03/2018   Procedure: ESOPHAGOGASTRODUODENOSCOPY (EGD);  Surgeon: Lin Landsman, MD;  Location: [facility] ENDOSCOPY;  Service: Gastroenterology;  Laterality: N/A;  . ESOPHAGOGASTRODUODENOSCOPY (EGD) WITH PROPOFOL N/A 02/10/2018   Procedure: ESOPHAGOGASTRODUODENOSCOPY (EGD) WITH PROPOFOL;  Surgeon: Jonathon Bellows, MD;  Location: [facility]  ENDOSCOPY;  Service: Gastroenterology;  Laterality: N/A;  . ESOPHAGOGASTRODUODENOSCOPY (EGD) WITH PROPOFOL N/A 03/25/2018   Procedure: ESOPHAGOGASTRODUODENOSCOPY (EGD) WITH PROPOFOL;  Surgeon: Lin Landsman, MD;  Location: [facility] ENDOSCOPY;  Service: Gastroenterology;  Laterality: N/A;  . ESOPHAGOGASTRODUODENOSCOPY (EGD) WITH PROPOFOL N/A 04/22/2018   Procedure: ESOPHAGOGASTRODUODENOSCOPY (EGD) WITH PROPOFOL with band ligation;  Surgeon: Lin Landsman, MD;  Location: [facility];  Service: Gastroenterology;  Laterality: N/A;  . ESOPHAGOGASTRODUODENOSCOPY (EGD) WITH PROPOFOL N/A 06/24/2018   Procedure: ESOPHAGOGASTRODUODENOSCOPY (EGD) WITH PROPOFOL;  Surgeon: Lin Landsman, MD;  Location: [facility] ENDOSCOPY;  Service: Gastroenterology;  Laterality: N/A;  . ESOPHAGOGASTRODUODENOSCOPY (EGD) WITH PROPOFOL N/A 11/19/2018   Procedure: ESOPHAGOGASTRODUODENOSCOPY (EGD) WITH PROPOFOL;  Surgeon: Lin Landsman, MD;  Location: [facility] ENDOSCOPY;  Service: Gastroenterology;  Laterality: N/A;  . ESOPHAGOGASTRODUODENOSCOPY (EGD) WITH PROPOFOL N/A 03/08/2020   Procedure: ESOPHAGOGASTRODUODENOSCOPY (EGD) WITH PROPOFOL;  Surgeon: Lin Landsman, MD;  Location: [facility] ENDOSCOPY;  Service: Gastroenterology;  Laterality: N/A;  . EYE SURGERY    . GIVENS CAPSULE STUDY N/A 03/29/2020   Procedure: GIVENS CAPSULE STUDY;  Surgeon: Lin Landsman, MD;  Location: [facility] ENDOSCOPY;  Service: Gastroenterology;  Laterality: N/A;  . HEMORRHOID BANDING  03/25/2018   Procedure: HEMORRHOID BANDING;  Surgeon: Lin Landsman, MD;  Location: [facility] ENDOSCOPY;  Service: Gastroenterology;;  . IR EMBO ART  VEN HEMORR LYMPH EXTRAV  INC GUIDE ROADMAPPING  08/07/2018  . IR RADIOLOGIST EVAL & MGMT  09/03/2018  . IR RADIOLOGIST EVAL & MGMT  12/17/2018  . IR RADIOLOGIST EVAL & MGMT  06/24/2019  . IR RADIOLOGIST EVAL & MGMT  01/22/2020  . IR TIPS  08/07/2018  . PORTA CATH INSERTION N/A 07/30/2020   Procedure: PORTA CATH  INSERTION;  Surgeon: Katha Cabal, MD;  Location: [place] CV LAB;  Service: Cardiovascular;  Laterality: N/A;  . RADIOLOGY WITH ANESTHESIA N/A 08/07/2018   Procedure: TIPS;  Surgeon: Corrie Mckusick, DO;  Location: [facility];  Service: Anesthesiology;  Laterality: N/A;  . TONSILLECTOMY       Current Outpatient Medications:  .  cyanocobalamin (,VITAMIN B-12,) 1000 MCG/ML injection, Inject 1,000 mcg into the muscle every 30 (thirty) days. , Disp: , Rfl:  .  furosemide (LASIX) 80 MG tablet, Take 69m (one tablet) in the morning daily with an additional 83mtablet as needed for weight gain of 2 pounds overnight or 5 pounds in week., Disp: 180 tablet, Rfl: 1 .  hydrOXYzine (ATARAX/VISTARIL) 25 MG tablet, Take 25 mg by mouth 3 (three) times daily., Disp: , Rfl:  .  lactulose (CHRONULAC) 10 GM/15ML solution, SMARTSIG:Milliliter(s) By Mouth, Disp: , Rfl:  .  Misc. Devices KIT, Moderate compression hose 20-30 MM HG, Disp: 1 kit, Rfl: 0 .  nystatin (MYCOSTATIN/NYSTOP) powder, Apply 1 g topically daily as needed (rash)., Disp: , Rfl:  .  ondansetron (ZOFRAN) 4 MG tablet, Take 4 mg by mouth every 8 (eight) hours as needed for nausea or vomiting. , Disp: , Rfl:  .  OneTouch Delica Lancets 3371GISC, 2 (two) times daily., Disp: , Rfl:  .  ONETOUCH ULTRA test strip, 2 (two) times daily., Disp: , Rfl:  .  pantoprazole (PROTONIX) 40 MG tablet, Take 40 mg by mouth 2 (two) times daily., Disp: , Rfl:  .  Potassium Chloride Crys ER (KLOR-CON M10 PO), Take 15 mg by mouth daily. Takes 1 1/2 tablet once a day, Disp: , Rfl:  .  spironolactone (ALDACTONE) 100 MG tablet, TAKE 1 TABLET BY MOUTH TWICE A DAY, Disp: 60 tablet, Rfl: 0 .  XIFAXAN 550 MG TABS tablet, Take 550 mg by mouth 2 (two) times daily., Disp: , Rfl:  .  Cholecalciferol (VITAMIN D3) 1.25 MG (50000 UT) CAPS, Take 1 capsule by mouth once a week.  (Patient not taking: No sig reported), Disp: , Rfl:  No current facility-administered medications for this  visit.  Facility-Administered Medications Ordered in Other Visits:  .  sodium chloride flush (NS) 0.9 % injection 10 mL, 10 mL, Intravenous, PRN, CoMike GipMelissa C, MD, 10 mL at 09/14/20 1320   Family History  Problem Relation Age of Onset  . CAD Father   . Heart attack Father 4042. Cancer Maternal Uncle   . Seizures Mother      Social History   Tobacco Use  . Smoking status: Never Smoker  . Smokeless tobacco: Never Used  Vaping Use  . Vaping Use: Never used  Substance Use Topics  . Alcohol use: Not Currently    Comment: No EtOH for 30 years.  Never a heavy drinker.  . Drug use: Never    Allergies as of 01/10/2021  . (No Known Allergies)  Review of Systems:    All systems reviewed and negative except where noted in HPI.   Physical Exam:  BP 124/77 (BP Location: Left Arm, Patient Position: Sitting, Cuff Size: Normal)   Pulse 74   Temp 97.7 F (36.5 C) (Oral)   Ht 5' 2" (1.575 m)   Wt 188 lb 2 oz (85.3 kg)   LMP  (LMP Unknown)   BMI 34.41 kg/m  No LMP recorded (lmp unknown). Patient is postmenopausal.  General:   Alert,  Well-developed, well-nourished, pleasant and cooperative in NAD, lethargic Head:  Normocephalic and atraumatic. Eyes:  Sclera clear, no icterus.   Conjunctiva pink. Ears:  Normal auditory acuity. Nose:  No deformity, discharge, or lesions. Mouth:  No deformity or lesions,oropharynx pink & moist. Neck:  Supple; no masses or thyromegaly. Lungs:  Respirations even and unlabored.  Clear throughout to auscultation.   No wheezes, crackles, or rhonchi. No acute distress. Heart:  Regular rate and rhythm; no murmurs, clicks, rubs, or gallops. Abdomen:  Normal bowel sounds. Soft, non-tender and nondistended, without masses, hepatosplenomegaly or hernias noted.  No guarding or rebound tenderness.   Rectal: Not performed Msk:  Symmetrical without gross deformities. muscle wasting in bilateral upper extremities Good, equal movement & strength  bilaterally. Pulses:  Normal pulses noted. Extremities:  No clubbing, 2+ edema, no cyanosis Neurologic:  Alert and oriented x3;  grossly normal neurologically. Skin: telangiectasias on upper anterior chest, the rash in her bilateral groin appears like a erythematous ring with central clearing, dry scaly margins Lymph Nodes:  No significant cervical adenopathy. Psych:  Alert and cooperative. Normal mood and affect.  Imaging Studies: Reviewed. CT A/P with contrast on 02/09/2018 revealed mildly enlarged retroperitoneal lymph nodes, largest with 11 mm concerning for metastatic disease or malignancy,18 mm spiculated density in the right lower lobe concerning for malignancy associated with small left pleural effusion  Assessment and Plan:   Crystal [name] is a 70 y.o. Caucasian female with metabolic syndrome, with decompensated cirrhosis secondary to ascites and refractory variceal bleed from esophageal varices status post TIPS and coil embolization of the left gastric vein here for follow-up  Decompensated cirrhosis: child class B, MELD-Na 9 Secondary liver disease workup positive for HCV antibodies, but viral load not detected, anti-smooth muscle antibodies weakly positive. 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 Ceruloplasmin, antimitochondrial antibodies, hepatitis B, alpha-1 antitrypsin came back negative Portal hypertension: manifested as ascites, esophageal varices, splenomegaly.  Patient currently has no ascites since placement of TIPS, controlled on low-sodium diet S/p TIPS: Secondary to refractory variceal bleed. Follow-up with interventional radiology every 6 months.  TIPS patent Volume overload: No evidence of ascites, does have significant bilateral swelling of legs.  Patient was evaluated by nephrologist, Dr. Anthonette Legato for chronic kidney disease.  I recommended her to continue Lasix 80 mg and spironolactone to 200 mg daily, continue  low-sodium diet.  We will do IV Lasix as needed via port, will coordinate with Dr. Mike Gip for administration of IV Lasix as needed.  Reiterated on low-sodium diet. Suggested her to check weight daily. Patient is also evaluated by cardiology, stable cardiac function, advised no further intervention at this time.  Ultrasound Dopplers revealed patent TIPS in 7/21.    Varices: variceal bleed from esophageal varices, status post EGD on 02/10/2018, EVLx10, status post EGD 03/25/2018 EVLx4, s/p EGD 04/22/18 EVLx2 and hemospray, refractory variceal bleed 08/03/2018, status post TIPS with coil embolization of left gastric vein on 08/07/2018.  Stopped propranolol  Repeat EGD in 11/2018 revealed no varices at all, GAVE treated with APC.   Pruritus: Continue hydroxyzine 10 mg 3 times a day as needed most likely secondary to cholestasis.  Stable alkaline phosphatase, increase hydroxyzine to 20 mg if needed  Anemia: Stable, iron deficient secondary to intermittent chronic blood loss likely from combination of AVMs, portal hypertensive gastropathy, GAVE.  Video capsule endoscopy revealed possible small bowel AVMs.  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  Patient is receiving IV iron as needed there is no evidence of coagulopathy.  She has mild thrombocytopenia HCC screening: AFP normal, no liver lesions on CT in 07/2018.  Repeat ultrasound in 05/2019 with no liver lesions. MRI liver mass protocol with MRCP in 10/2019 was unremarkable.  Ultrasound liver Doppler in 05/2020 normal, repeat right upper quadrant ultrasound.  PSE: Started on lactulose post TIPS.  Continue rifaximin 550 mg twice daily, long-term.  Chronic kidney disease: Does have declining renal function since 01/2020, GFR between 30 and 40, follow-up with nephrology Hepatic hydrothorax: none   Health maintenance:  She is immune to hepatitis B She received Pneumovax and influenza vaccine in 07/2018 vitamin D levels 29.2 on  03/15/2018 Avoid excess Tylenol and NSAIDs use Colon cancer screening:underwent colonoscopy on 04/11/2018, One 5 mm polyp in the ascending colon, removed with a cold snare, Patch showed tubular adenoma with no evidence of high-grade dysplasia or malignancy. Recommend repeat colonoscopy in 5 years Right lower lobe lung lesion and retroperitoneal lymphadenopathy: follow-up with Dr. Mike Gip, no evidence of retroperitoneal lymphadenopathy. Repeat CT chest revealed that the lesions were stable.  Patient underwent bronchoscopy, sample was limited, no evidence of malignancy.  Plan to monitor the lesion with imaging for now  Follow up in 4 to 6 months  [name], MD

## 2021-01-12 ENCOUNTER — Other Ambulatory Visit: Payer: Self-pay

## 2021-01-12 ENCOUNTER — Inpatient Hospital Stay: Payer: Medicare HMO | Attending: Hematology and Oncology

## 2021-01-12 VITALS — BP 130/76 | HR 72 | Temp 97.9°F | Resp 16

## 2021-01-12 DIAGNOSIS — D509 Iron deficiency anemia, unspecified: Secondary | ICD-10-CM | POA: Insufficient documentation

## 2021-01-12 DIAGNOSIS — E538 Deficiency of other specified B group vitamins: Secondary | ICD-10-CM | POA: Diagnosis not present

## 2021-01-12 DIAGNOSIS — Z95828 Presence of other vascular implants and grafts: Secondary | ICD-10-CM

## 2021-01-12 DIAGNOSIS — D500 Iron deficiency anemia secondary to blood loss (chronic): Secondary | ICD-10-CM

## 2021-01-12 MED ORDER — SODIUM CHLORIDE 0.9% FLUSH
10.0000 mL | INTRAVENOUS | Status: DC | PRN
  Administered 2021-01-12: 10 mL via INTRAVENOUS
  Filled 2021-01-12: qty 10

## 2021-01-12 MED ORDER — SODIUM CHLORIDE 0.9 % IV SOLN
Freq: Once | INTRAVENOUS | Status: AC
  Filled 2021-01-12: qty 250

## 2021-01-12 MED ORDER — IRON SUCROSE 20 MG/ML IV SOLN
200.0000 mg | Freq: Once | INTRAVENOUS | Status: AC
  Administered 2021-01-12: 200 mg via INTRAVENOUS
  Filled 2021-01-12: qty 10

## 2021-01-12 MED ORDER — HEPARIN SOD (PORK) LOCK FLUSH 100 UNIT/ML IV SOLN
500.0000 [IU] | Freq: Once | INTRAVENOUS | Status: AC
  Administered 2021-01-12: 500 [IU] via INTRAVENOUS
  Filled 2021-01-12: qty 5

## 2021-01-12 NOTE — Progress Notes (Signed)
Patient received prescribed treatment in clinic. Tolerated well. Patient stable at discharge.

## 2021-01-13 ENCOUNTER — Ambulatory Visit (INDEPENDENT_AMBULATORY_CARE_PROVIDER_SITE_OTHER): Payer: Medicare HMO | Admitting: Family

## 2021-01-13 ENCOUNTER — Encounter: Payer: Self-pay | Admitting: Family

## 2021-01-13 VITALS — BP 120/60 | HR 76 | Ht 62.0 in | Wt 188.0 lb

## 2021-01-13 DIAGNOSIS — I5032 Chronic diastolic (congestive) heart failure: Secondary | ICD-10-CM

## 2021-01-13 DIAGNOSIS — K7460 Unspecified cirrhosis of liver: Secondary | ICD-10-CM | POA: Diagnosis not present

## 2021-01-13 DIAGNOSIS — E871 Hypo-osmolality and hyponatremia: Secondary | ICD-10-CM

## 2021-01-13 DIAGNOSIS — I2584 Coronary atherosclerosis due to calcified coronary lesion: Secondary | ICD-10-CM

## 2021-01-13 DIAGNOSIS — I2510 Atherosclerotic heart disease of native coronary artery without angina pectoris: Secondary | ICD-10-CM

## 2021-01-13 NOTE — Patient Instructions (Signed)
Medication Instructions:  Continue your current medications.   *If you need a refill on your cardiac medications before your next appointment, please call your pharmacy*  Lab Work: None ordered today.   Testing/Procedures: None ordered today.   Follow-Up: At [facility], you and your health needs are our priority.  As part of our continuing mission to provide you with exceptional heart care, we have created designated Provider Care Teams.  These Care Teams include your primary Cardiologist (physician) and Advanced Practice Providers (APPs -  Physician Assistants and Nurse Practitioners) who all work together to provide you with the care you need, when you need it.  We recommend signing up for the patient portal called "MyChart".  Sign up information is provided on this After Visit Summary.  MyChart is used to connect with patients for Virtual Visits (Telemedicine).  Patients are able to view lab/test results, encounter notes, upcoming appointments, etc.  Non-urgent messages can be sent to your provider as well.   To learn more about what you can do with MyChart, go to [email or web address].    Your next appointment:   2 month(s)  The format for your next appointment:   In Person  Provider:   You may see Nelva Bush, MD or one of the following Advanced Practice Providers on your designated Care Team:    Murray Hodgkins, NP  Christell Faith, PA-C  Marrianne Mood, PA-C  Cadence Kathlen Mody, [place]  [name], NP  Other Instructions  Your appointment that you scheduled while in the office with another provider was for March 11th at 9 AM.

## 2021-01-13 NOTE — Progress Notes (Signed)
Office Visit    Patient Name: Crystal [name] Date of Encounter: 01/13/2021  Primary Care Provider:  Martin Majestic, FNP Primary Cardiologist:  Nelva Bush, MD Electrophysiologist:  None   Chief Complaint    Crystal [name] is a 70 y.o. female with a hx of chronic HFpEF, cirrhosis complicated by esophageal varices status post TIPS, peptic ulcer disease, DM2, pulmonary nodules, iron deficiency anemia, vitamin B12 deficiency  presents today for follow up of HFpEF.  Past Medical History    Past Medical History:  Diagnosis Date  . Acute GI bleeding 02/09/2018  . Acute upper gastrointestinal bleeding 08/03/2018  . Anemia    TAKES IRON TAB  . Arthritis    SHOULDER  . B12 deficiency 02/18/2018  . Bleeding    GI  3/19  . Bronchitis    HX OF  . CHF (congestive heart failure) ([place])   . Cirrhosis ([place])   . Colon cancer screening   . Diabetes mellitus without complication ([place])    TYPE 2  . Fatty (change of) liver, not elsewhere classified   . Full dentures    UPPER AND LOWER  . Iron deficiency anemia due to chronic blood loss 02/18/2018  . Lung nodule, multiple   . Pernicious anemia 02/22/2018  . PUD (peptic ulcer disease)   . Raynaud's syndrome   . Upper GI bleeding 08/03/2018   Past Surgical History:  Procedure Laterality Date  . CATARACT EXTRACTION W/PHACO Right 02/06/2018   Procedure: CATARACT EXTRACTION PHACO AND INTRAOCULAR LENS PLACEMENT ([place]) RIGHT DIABETIC;  Surgeon: Leandrew Koyanagi, MD;  Location: [place];  Service: Ophthalmology;  Laterality: Right;  DIABETIC, ORAL MED  . CATARACT EXTRACTION W/PHACO Left 04/30/2018   Procedure: CATARACT EXTRACTION PHACO AND INTRAOCULAR LENS PLACEMENT (I[facility]);  Surgeon: Leandrew Koyanagi, MD;  Location: [facility];  Service: Ophthalmology;  Laterality: Left;  [place] 01:04 AP% 21.3 CDE 13.66 Fluid pack lot # [id number]  . COLONOSCOPY WITH PROPOFOL N/A 03/25/2018   Procedure: COLONOSCOPY WITH PROPOFOL;  Surgeon: Lin Landsman, MD;  Location: [facility] ENDOSCOPY;  Service: Gastroenterology;  Laterality: N/A;  . DENTAL SURGERY     EXTRACTIONS  . ELECTROMAGNETIC NAVIGATION BROCHOSCOPY Right 01/27/2019   Procedure: ELECTROMAGNETIC NAVIGATION BRONCHOSCOPY;  Surgeon: [name], MD;  Location: [facility];  Service: Cardiopulmonary;  Laterality: Right;  . ENTEROSCOPY N/A 07/28/2020   Procedure: Push ENTEROSCOPY;  Surgeon: Lin Landsman, MD;  Location: [facility] ENDOSCOPY;  Service: Gastroenterology;  Laterality: N/A;  . ESOPHAGOGASTRODUODENOSCOPY N/A 08/03/2018   Procedure: ESOPHAGOGASTRODUODENOSCOPY (EGD);  Surgeon: Lin Landsman, MD;  Location: [facility] ENDOSCOPY;  Service: Gastroenterology;  Laterality: N/A;  . ESOPHAGOGASTRODUODENOSCOPY (EGD) WITH PROPOFOL N/A 02/10/2018   Procedure: ESOPHAGOGASTRODUODENOSCOPY (EGD) WITH PROPOFOL;  Surgeon: Jonathon Bellows, MD;  Location: [facility] ENDOSCOPY;  Service: Gastroenterology;  Laterality: N/A;  . ESOPHAGOGASTRODUODENOSCOPY (EGD) WITH PROPOFOL N/A 03/25/2018   Procedure: ESOPHAGOGASTRODUODENOSCOPY (EGD) WITH PROPOFOL;  Surgeon: Lin Landsman, MD;  Location: [facility] ENDOSCOPY;  Service: Gastroenterology;  Laterality: N/A;  . ESOPHAGOGASTRODUODENOSCOPY (EGD) WITH PROPOFOL N/A 04/22/2018   Procedure: ESOPHAGOGASTRODUODENOSCOPY (EGD) WITH PROPOFOL with band ligation;  Surgeon: Lin Landsman, MD;  Location: [place];  Service: Gastroenterology;  Laterality: N/A;  . ESOPHAGOGASTRODUODENOSCOPY (EGD) WITH PROPOFOL N/A 06/24/2018   Procedure: ESOPHAGOGASTRODUODENOSCOPY (EGD) WITH PROPOFOL;  Surgeon: Lin Landsman, MD;  Location: [facility] ENDOSCOPY;  Service: Gastroenterology;  Laterality: N/A;  . ESOPHAGOGASTRODUODENOSCOPY (EGD) WITH PROPOFOL N/A 11/19/2018   Procedure: ESOPHAGOGASTRODUODENOSCOPY (EGD) WITH PROPOFOL;  Surgeon: Lin Landsman, MD;  Location: [facility]  ENDOSCOPY;  Service: Gastroenterology;  Laterality: N/A;  . ESOPHAGOGASTRODUODENOSCOPY (EGD) WITH PROPOFOL N/A  03/08/2020   Procedure: ESOPHAGOGASTRODUODENOSCOPY (EGD) WITH PROPOFOL;  Surgeon: Lin Landsman, MD;  Location: [facility] ENDOSCOPY;  Service: Gastroenterology;  Laterality: N/A;  . EYE SURGERY    . GIVENS CAPSULE STUDY N/A 03/29/2020   Procedure: GIVENS CAPSULE STUDY;  Surgeon: Lin Landsman, MD;  Location: [facility] ENDOSCOPY;  Service: Gastroenterology;  Laterality: N/A;  . HEMORRHOID BANDING  03/25/2018   Procedure: HEMORRHOID BANDING;  Surgeon: Lin Landsman, MD;  Location: [facility] ENDOSCOPY;  Service: Gastroenterology;;  . IR EMBO ART  VEN HEMORR LYMPH EXTRAV  INC GUIDE ROADMAPPING  08/07/2018  . IR RADIOLOGIST EVAL & MGMT  09/03/2018  . IR RADIOLOGIST EVAL & MGMT  12/17/2018  . IR RADIOLOGIST EVAL & MGMT  06/24/2019  . IR RADIOLOGIST EVAL & MGMT  01/22/2020  . IR TIPS  08/07/2018  . PORTA CATH INSERTION N/A 07/30/2020   Procedure: PORTA CATH INSERTION;  Surgeon: Katha Cabal, MD;  Location: [place] CV LAB;  Service: Cardiovascular;  Laterality: N/A;  . RADIOLOGY WITH ANESTHESIA N/A 08/07/2018   Procedure: TIPS;  Surgeon: Corrie Mckusick, DO;  Location: [place];  Service: Anesthesiology;  Laterality: N/A;  . TONSILLECTOMY     Allergies No Known Allergies  History of Present Illness    Crystal [name] is a 70 y.o. female with a hx of chronic HFpEF, cirrhosis complicated by esophageal varices status post TIPS, peptic ulcer disease, DM2, pulmonary nodules, iron deficiency anemia, vitamin B12 deficiency last seen 12/16/2020  She has had coronary artery calcification and aortic atherosclerosis incidentally noted on prior CTs.  No symptoms suggestive of CAD.   Echo 04/04/2019 LVEF 55 to 60%, mild LVH, LV diastolic parameters consistent with impaired relaxation, RV normal size and function, no significant valvular abnormalities, mild dilation of aortic root 3.5 cm.  Echo 05/27/20 with normal LVEF and grade 2 diastolic dysfunction, RV mildly enlarged, no significant valvular abnormalities.    Clinic visit 12/2019 increased Lasix to 27m until weight returned to baseline as she was 6 pounds up. Underwent EGD 03/08/2020 with GI and capsule study 03/29/2020.   Follows with oncology for B12 deficiency/iron deficiency anemia. LE duplex 03/30/20 ordered by oncology due to LE edema with no evidence of acute DVT bilaterally. Noted chronic DVT, calcified, at the junction of distal common femoral vein and origin of the femoral vein with diminished flow focally at this site. As it was chronic, not recommended for anticoagulation especially in setting of anemia.    Seen in urgent care 11/23/20 due to boil on right calf treated with cephalexin and doxycycline. She had continued swelling up into her abdomen and was seen in the ED 12/03/20 ang vien Furosemide 669mIVx1 and discharged home. She saw her gastroenterologist 12/04/20 and Spironolactone increased to 10025maily. Seen in follow up by Dr. EndSaunders Revel26/22 with no improvement in edema. Her Reds Vest was only mildly elevated at 39%. Concern was that bowel wall edema was leading to poor absorption of oral diuretics. Dr. EndSaunders Revelranged for her to received furosemide 31m39m x1 when port was accessed at the cancer center 12/09/20. She was also recommended for repeat echocardiogram. Echo 12/14/20 LVEF 55-60% with no wall motion abnormalities, LV moderately dilated, gr1DD, RV normal size and function, bilateral atria mildly dilated, mild MR, trivial AI.  Repeat lab work 12/14/20 with Na 130, creatinine 2.16, GFR 24.   Seen in follow up 12/16/20 with weight down  2 pounds and improvement in edema. She was drinking >2 L and encouraged to reduced. She was taking Lasix 42m BID, Spironolactone 1013mBID. She was recommended to reduce Lasix to 8030mD with additional afternoon dose PRN for weight gain of 2 pounds overnight or 5 pounds in 1 week.   She presents for follow-up.  Her weight has remained stable compared to last clinic visit.  She reports no chest pain, pressure,  tightness.  She reports no shortness of breath at rest.  Endorses her dyspnea on exertion is stable at baseline.  Reports improvement in the swelling in her calves as well as abdomen.  Still with some edema in her legs.  She does sit with her legs elevated when she is able to.  No orthopnea, PND.  She has upcoming appointment with nephrology.  EKGs/Labs/Other Studies Reviewed:   The following studies were reviewed today:  Echo 12/14/2020  1. Left ventricular ejection fraction, by estimation, is 55 to 60%. The  left ventricle has normal function. The left ventricle has no regional  wall motion abnormalities. The left ventricular internal cavity size was  moderately dilated. There is mild  left ventricular hypertrophy. Left ventricular diastolic parameters are  consistent with Grade I diastolic dysfunction (impaired relaxation).  Elevated left atrial pressure.   2. Right ventricular systolic function is normal. The right ventricular  size is normal. Tricuspid regurgitation signal is inadequate for assessing  PA pressure.   3. Left atrial size was mildly dilated.   4. Right atrial size was mildly dilated.   5. The mitral valve is degenerative. Mild mitral valve regurgitation. No  evidence of mitral stenosis. Moderate mitral annular calcification.   6. The aortic valve is tricuspid. There is mild calcification of the  aortic valve. There is mild thickening of the aortic valve. Aortic valve  regurgitation is trivial. Mild to moderate aortic valve  sclerosis/calcification is present, without any  evidence of aortic stenosis.   7. Aortic dilatation noted. There is mild dilatation of the ascending  aorta, measuring 36 mm.   EKG:  No EKG today  Recent Labs: 04/22/2020: ALT 16 05/27/2020: TSH 3.742 12/03/2020: B Natriuretic Peptide 514.6 12/14/2020: Magnesium 1.9 01/03/2021: BUN 25; Creatinine, Ser 2.13; Hemoglobin 10.1; Platelets 135; Potassium 3.8; Sodium 132  Recent Lipid Panel    Component  Value Date/Time   TRIG 163 (H) 08/03/2018 2152   Home Medications   Current Meds  Medication Sig  . cyanocobalamin (,VITAMIN B-12,) 1000 MCG/ML injection Inject 1,000 mcg into the muscle every 30 (thirty) days.   . furosemide (LASIX) 80 MG tablet Take 56m63mne tablet) in the morning daily with an additional 56mg70mlet as needed for weight gain of 2 pounds overnight or 5 pounds in week.  . hydrOXYzine (ATARAX/VISTARIL) 25 MG tablet Take 25 mg by mouth 3 (three) times daily.  . lacMarland Kitchenulose (CHRONULAC) 10 GM/15ML solution SMARTSIG:Milliliter(s) By Mouth  . Misc. Devices KIT Moderate compression hose 20-30 MM HG  . nystatin (MYCOSTATIN/NYSTOP) powder Apply 1 g topically daily as needed (rash).  . ondansetron (ZOFRAN) 4 MG tablet Take 4 mg by mouth every 8 (eight) hours as needed for nausea or vomiting.   . OneGlory Rosebushca Lancets 33G M56L 2 (two) times daily.  . ONEGlory RosebushA test strip 2 (two) times daily.  . pantoprazole (PROTONIX) 40 MG tablet Take 40 mg by mouth 2 (two) times daily.  . Potassium Chloride Crys ER (KLOR-CON M10 PO) Take 15 mg by mouth daily. Takes 1 1/2  tablet once a day  . spironolactone (ALDACTONE) 100 MG tablet TAKE 1 TABLET BY MOUTH TWICE A DAY  . XIFAXAN 550 MG TABS tablet Take 550 mg by mouth 2 (two) times daily.    Review of Systems   All other systems reviewed and are otherwise negative except as noted above.  Physical Exam    VS:  BP 120/60 (BP Location: Left Arm, Patient Position: Sitting, Cuff Size: Normal)   Pulse 76   Ht 5' 2" (1.575 m)   Wt 188 lb (85.3 kg)   LMP  (LMP Unknown)   BMI 34.39 kg/m  , BMI Body mass index is 34.39 kg/m.  Wt Readings from Last 3 Encounters:  01/13/21 188 lb (85.3 kg)  01/10/21 188 lb 2 oz (85.3 kg)  01/04/21 187 lb 9.8 oz (85.1 kg)    GEN: Well nourished, overweight, well developed, in no acute distress. HEENT: normal. Neck: Supple, no JVD, carotid bruits, or masses. Cardiac: RRR, no murmurs, rubs, or gallops. No  clubbing, cyanosis.  Nonpitting edema to bilateral lower extremity.  No erythema, signs of infection.  Radials/DP/PT 2+ and equal bilaterally.  Respiratory:  Respirations regular and unlabored, clear to auscultation bilaterally. GI: Soft, nontender, nondistended. MS: No deformity or atrophy. Skin: Warm and dry, no rash. Neuro:  Strength and sensation are intact. Psych: Normal affect.  Assessment & Plan    1. HFpEF/cirrhosis - Weight stable. NYHA II.   Edema improving.  Reports stable dyspnea on exertion.  No orthopnea, PND.  Continuing Spironolactone 100 mg twice daily at the direction of Dr. Marius Ditch of gastroenterology.  Continue Lasix 80 mg daily with additional 80 mg in the afternoon as needed for weight gain of 2 pounds overnight or 5 pounds in 1 week.  Tells me she has not needed as needed dosing since our last clinic visit.  She has upcoming appointment with nephrology for further input on her diuretics.  2. Hyponatremia - Lab work 12/14/2020 shows sodium 130.  01/03/2021 sodium 132.  Strict fluid restriction encouraged.  Careful monitoring and follow-up with nephrology recommended.    3. Coronary artery calcification on CT scan and aortic atherosclerosis -stable with no anginal symptoms.  No indication for ischemic evaluation this time.  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  No aspirin due to esophageal varices  4. CKD IIIb -continue to follow with nephrology.    5. Anemia - Continue to follow with hematology.   Disposition: Follow up in 2-3 months with Dr. Saunders Revel or APP   Signed, Loel Dubonnet, NP 01/13/2021, 4:56 PM [place]

## 2021-01-17 ENCOUNTER — Telehealth: Payer: Self-pay | Admitting: Hematology and Oncology

## 2021-01-17 NOTE — Telephone Encounter (Signed)
01/17/2021 Spoke w/ Crystal [name] and informed her Dr. Loletha Grayer wanted her to get a chest CT. Scheduled for 02/01/21, arrive @ 12:45. Crystal [name] confirmed appt SRW

## 2021-01-18 ENCOUNTER — Other Ambulatory Visit: Payer: Self-pay | Admitting: Interventional Radiology

## 2021-01-18 DIAGNOSIS — K7460 Unspecified cirrhosis of liver: Secondary | ICD-10-CM

## 2021-01-18 DIAGNOSIS — I8510 Secondary esophageal varices without bleeding: Secondary | ICD-10-CM

## 2021-01-20 ENCOUNTER — Ambulatory Visit
Admission: RE | Admit: 2021-01-20 | Discharge: 2021-01-20 | Disposition: A | Discharge status: Home / Self Care | Payer: Medicare HMO | Source: Ambulatory Visit | Attending: Gastroenterology | Admitting: Gastroenterology

## 2021-01-20 ENCOUNTER — Other Ambulatory Visit: Payer: Self-pay | Admitting: Family

## 2021-01-20 ENCOUNTER — Other Ambulatory Visit: Payer: Self-pay

## 2021-01-20 DIAGNOSIS — K7290 Hepatic failure, unspecified without coma: Secondary | ICD-10-CM | POA: Insufficient documentation

## 2021-01-20 DIAGNOSIS — K8020 Calculus of gallbladder without cholecystitis without obstruction: Secondary | ICD-10-CM | POA: Diagnosis not present

## 2021-01-20 DIAGNOSIS — I5032 Chronic diastolic (congestive) heart failure: Secondary | ICD-10-CM

## 2021-01-20 DIAGNOSIS — K7460 Unspecified cirrhosis of liver: Secondary | ICD-10-CM | POA: Diagnosis not present

## 2021-01-21 NOTE — Telephone Encounter (Signed)
Rx request sent to pharmacy.

## 2021-01-24 ENCOUNTER — Other Ambulatory Visit: Payer: Self-pay | Admitting: Gastroenterology

## 2021-01-24 DIAGNOSIS — M25474 Effusion, right foot: Secondary | ICD-10-CM

## 2021-01-24 DIAGNOSIS — R635 Abnormal weight gain: Secondary | ICD-10-CM

## 2021-01-24 DIAGNOSIS — R609 Edema, unspecified: Secondary | ICD-10-CM

## 2021-01-24 DIAGNOSIS — M25475 Effusion, left foot: Secondary | ICD-10-CM

## 2021-01-25 DIAGNOSIS — R600 Localized edema: Secondary | ICD-10-CM | POA: Diagnosis not present

## 2021-01-25 DIAGNOSIS — N184 Chronic kidney disease, stage 4 (severe): Secondary | ICD-10-CM | POA: Diagnosis not present

## 2021-01-25 DIAGNOSIS — D631 Anemia in chronic kidney disease: Secondary | ICD-10-CM | POA: Diagnosis not present

## 2021-01-27 ENCOUNTER — Telehealth: Payer: Self-pay | Admitting: Gastroenterology

## 2021-01-27 ENCOUNTER — Telehealth: Payer: Self-pay | Admitting: Family

## 2021-01-27 ENCOUNTER — Telehealth: Payer: Self-pay | Admitting: *Deleted

## 2021-01-27 DIAGNOSIS — I509 Heart failure, unspecified: Secondary | ICD-10-CM | POA: Diagnosis not present

## 2021-01-27 DIAGNOSIS — E1121 Type 2 diabetes mellitus with diabetic nephropathy: Secondary | ICD-10-CM | POA: Diagnosis not present

## 2021-01-27 DIAGNOSIS — E782 Mixed hyperlipidemia: Secondary | ICD-10-CM | POA: Diagnosis not present

## 2021-01-27 DIAGNOSIS — I10 Essential (primary) hypertension: Secondary | ICD-10-CM | POA: Diagnosis not present

## 2021-01-27 NOTE — Telephone Encounter (Signed)
Pt c/o swelling: STAT is pt has developed SOB within 24 hours  1) How much weight have you gained and in what time span? 11 lbs since 01/13/21  2) If swelling, where is the swelling located? Legs tops to bottom and stomach  3) Are you currently taking a fluid pill? Yes, lasix bid  4) Are you currently SOB? Only on exertion  5) Do you have a log of your daily weights (if so, list)?   3/17 199  3/3 188  6) Have you gained 3 pounds in a day or 5 pounds in a week? yes  7) Have you traveled recently? no

## 2021-01-27 NOTE — Telephone Encounter (Signed)
She verbalized understanding but wants to know about her ultrasound

## 2021-01-27 NOTE — Telephone Encounter (Signed)
Patient called to ask about her results.  She saw them on MyChart but did not understand how to read them.  Please call

## 2021-01-27 NOTE — Telephone Encounter (Signed)
NP Walker from [facility] called stating that patient needs IV Lasix ans is asking if we could give it to her when she come into our office on 02/01/21. Please return call [phone].  Of note, she is for a B12 injection that day and not an infusion

## 2021-01-27 NOTE — Telephone Encounter (Signed)
Spoke with patient and reviewed provider recommendations to continue furosemide 80 mg twice a day and that message was left to see if they can give her the IV lasix at upcoming infusion appointment.We discussed leg elevation, decreased fluid intake, and a low sodium diet. She verbalized understanding of our conversation, agreement with plan, and had no further questions at this time.

## 2021-01-27 NOTE — Telephone Encounter (Signed)
Patient blood work was done on 12/14/2020 please advised

## 2021-01-27 NOTE — Telephone Encounter (Signed)
Per Dr. Marius Ditch ultrasound Cirrhosis was stable and no liver lesions. Patient verbalized understanding

## 2021-01-27 NOTE — Telephone Encounter (Signed)
Continue Furosemide 18m twice daily. Recommend drinking less than 2L per day, elevating lower extremities, and low salt diet.   Recommend she weigh daily each morning and keep a log. She should contact [place] of 2 pounds overnight or 5 pounds in one week. Of note, labs 01/25/21 at nephrology with Hb 10.3, creatinine 2.06, GFR 24, Albumin 2.3.  She likely will require a dose of IV Lasix. [facility] has [facility] injection appointment with them 02/01/21. I have left them a voicemail to request if we can give Lasix 861mIV x1 dose at her upcoming appointment. Await call back from their clinic.  CaLoel DubonnetNP

## 2021-01-27 NOTE — Telephone Encounter (Signed)
  Please call.  We gave her Lasix 80 mg IV once before in clinic.  They need to send [place] an Rx for the Lasix.  We need to make sure pharmacy has the drug.  I will typically see her briefly and review the plan with her and make sure her potassium is ok.  M

## 2021-01-27 NOTE — Telephone Encounter (Signed)
Her kidney function, sodium levels and potassium levels are all stable  RV

## 2021-01-27 NOTE — Telephone Encounter (Signed)
  Yes, we can take care of that on 02/01/2021.  Can you send [place] a script as you did before?  Is her potassium ok?  M

## 2021-01-27 NOTE — Telephone Encounter (Signed)
Spoke with patient and she reports increase in weight from 188 to 199 with swelling, shortness of breath with exertion, and the weight increase. She is taking Furosemide 80 mg twice a day since her weight is up and saw Nephrology on 01/25/21. Advised that I would send this over to provider for further review and recommendations and that we would call back. She verbalized understanding of our conversation, agreement with plan, and had no further questions at this time.

## 2021-01-28 ENCOUNTER — Other Ambulatory Visit: Payer: Self-pay | Admitting: *Deleted

## 2021-01-28 ENCOUNTER — Other Ambulatory Visit: Payer: Self-pay | Admitting: Family

## 2021-01-28 DIAGNOSIS — E538 Deficiency of other specified B group vitamins: Secondary | ICD-10-CM

## 2021-01-28 DIAGNOSIS — I5032 Chronic diastolic (congestive) heart failure: Secondary | ICD-10-CM

## 2021-01-28 DIAGNOSIS — K7460 Unspecified cirrhosis of liver: Secondary | ICD-10-CM

## 2021-01-28 NOTE — Telephone Encounter (Signed)
Sending paper Rx via fax. 01/25/21 K 3.5 - we are instructing her to take 12mq of extra potassium on 02/01/21. One of our nursing team is calling her to make her aware. Thank you so much for the assistance in facilitating!  CLoel Dubonnet NP

## 2021-01-28 NOTE — Telephone Encounter (Signed)
[facility] calling  States they will need a prescription faxed to them at [address]

## 2021-01-28 NOTE — Telephone Encounter (Signed)
  Brief MD and lab appt (BMP).  Thanks.

## 2021-01-28 NOTE — Telephone Encounter (Signed)
Spoke with patient and reviewed that orders were sent over to Dr. Kem Parkinson office for her to have Furosemide 80 mg IV at the time of her appointment with them. She verbalized understanding and was very appreciative for the update. No further questions at this time.

## 2021-01-28 NOTE — Telephone Encounter (Signed)
Call placed to [facility], NP to fax orders to [facility] for IV Lasix 80 mg. I have confirmed with pharmacy that the drug will be available. Do you need lab/MD encounters added. Patient is only scheduled for B12 injection.

## 2021-01-28 NOTE — Telephone Encounter (Signed)
Will review request from [facility] with provider.

## 2021-01-28 NOTE — Telephone Encounter (Signed)
Scheduling message sent, lab orders entered. Thanks!

## 2021-01-28 NOTE — Telephone Encounter (Signed)
Lab work 01/25/21 with nephrology K 3.6, Hb 10.3, creatinine 2.06, GFR 24, albumin 2.3.   Provided signed Rx for Lasix 63m IV once to triage nurse who will fax to cancer center.  Will request triage nurse make Miss Beagley aware of the plan of care as follows: Recommend Miss Bui take and additional 213m of Potassium in the afternoon on 02/01/21 as she is receiving IV Lasix. She routinely takes 1548monce daily so on 02/01/21 she will take 55m21mn the AM and 20mE50m the PM.  Appreciate the collaborative care.   CaitlLoel Dubonnet

## 2021-01-31 NOTE — Progress Notes (Signed)
[facility]  [address], [address] [place], [place] [address] Phone: [phone]  Fax: [phone]   Clinic Day:  02/01/2021  Referring physician: Martin Majestic, *  Chief Complaint: Crystal [name] is a 70 y.o. female with decompensated cirrhosis s/p TIPS procedure, right sided pulmonary nodules who is seen for 1 month assessment and IV Lasix per cardiology.  HPI: The patient was last seen in the medical oncology clinic on 01/04/2021. At that time, she was fatigued. She was a little bit short of breath on exertion. Her diet was stable. She denied bleeding of any kind. Exam was stable. Hematocrit was 30.6, hemoglobin 10.1, MCV 79.3, platelets 135,000, WBC 5,700. Sodium was 132. Creatinine was 2.13 (CrCl 25 ml/min). Calcium was 8.5. Ferritin was 28 with an iron saturation of 45% and a TIBC of 204. She received Venofer and a vitamin B12 injection.  The patient saw Dr. Marius Ditch on 01/10/2021 for a follow-up. They discussed continuing Lasix 80 mg and spironolactone 200 mg. Follow-up was planned for 4-6 months.  The patient saw [name], NP on 01/13/2021. Her shortness of breath on exertion was stable. The swelling in her calves and abdomen had improved. She reported that she had not taken any of her prn Lasix since her last visit. Follow-up was planned for 2-3 months.   The patient saw Dr. Holley Raring on 01/25/2021. Her chronic kidney disease appeared advanced but stable. She continued Lasix 80 mg BID. Her leg swelling was more prominent. Follow-up was planned for 4 months.  She received Venofer on 01/12/2021.  During the interim, she has been "okay." She reports weight gain due to fluid. She is up 8 lbs since her last visit. She has fluid in her legs and abdomen. She has shortness of breath on exertion. She denies bleeding of any kind. Her blood sugar has been okay.  She states that IV Lasix works better than her p.o. Lasix.   Past Medical History:  Diagnosis  Date  . Acute GI bleeding 02/09/2018  . Acute upper gastrointestinal bleeding 08/03/2018  . Anemia    TAKES IRON TAB  . Arthritis    SHOULDER  . B12 deficiency 02/18/2018  . Bleeding    GI  3/19  . Bronchitis    HX OF  . CHF (congestive heart failure) ([place])   . Cirrhosis ([place])   . Colon cancer screening   . Diabetes mellitus without complication ([place])    TYPE 2  . Fatty (change of) liver, not elsewhere classified   . Full dentures    UPPER AND LOWER  . Iron deficiency anemia due to chronic blood loss 02/18/2018  . Lung nodule, multiple   . Pernicious anemia 02/22/2018  . PUD (peptic ulcer disease)   . Raynaud's syndrome   . Upper GI bleeding 08/03/2018    Past Surgical History:  Procedure Laterality Date  . CATARACT EXTRACTION W/PHACO Right 02/06/2018   Procedure: CATARACT EXTRACTION PHACO AND INTRAOCULAR LENS PLACEMENT ([place]) RIGHT DIABETIC;  Surgeon: Leandrew Koyanagi, MD;  Location: [place];  Service: Ophthalmology;  Laterality: Right;  DIABETIC, ORAL MED  . CATARACT EXTRACTION W/PHACO Left 04/30/2018   Procedure: CATARACT EXTRACTION PHACO AND INTRAOCULAR LENS PLACEMENT (I[facility]);  Surgeon: Leandrew Koyanagi, MD;  Location: [facility];  Service: Ophthalmology;  Laterality: Left;  [place] 01:04 AP% 21.3 CDE 13.66 Fluid pack lot # [id number]  . COLONOSCOPY WITH PROPOFOL N/A 03/25/2018   Procedure: COLONOSCOPY WITH PROPOFOL;  Surgeon: Lin Landsman, MD;  Location: [facility]  ENDOSCOPY;  Service: Gastroenterology;  Laterality: N/A;  . DENTAL SURGERY     EXTRACTIONS  . ELECTROMAGNETIC NAVIGATION BROCHOSCOPY Right 01/27/2019   Procedure: ELECTROMAGNETIC NAVIGATION BRONCHOSCOPY;  Surgeon: [name], MD;  Location: [facility];  Service: Cardiopulmonary;  Laterality: Right;  . ENTEROSCOPY N/A 07/28/2020   Procedure: Push ENTEROSCOPY;  Surgeon: Lin Landsman, MD;  Location: [facility] ENDOSCOPY;  Service: Gastroenterology;  Laterality: N/A;  . ESOPHAGOGASTRODUODENOSCOPY N/A 08/03/2018    Procedure: ESOPHAGOGASTRODUODENOSCOPY (EGD);  Surgeon: Lin Landsman, MD;  Location: [facility] ENDOSCOPY;  Service: Gastroenterology;  Laterality: N/A;  . ESOPHAGOGASTRODUODENOSCOPY (EGD) WITH PROPOFOL N/A 02/10/2018   Procedure: ESOPHAGOGASTRODUODENOSCOPY (EGD) WITH PROPOFOL;  Surgeon: Jonathon Bellows, MD;  Location: [facility] ENDOSCOPY;  Service: Gastroenterology;  Laterality: N/A;  . ESOPHAGOGASTRODUODENOSCOPY (EGD) WITH PROPOFOL N/A 03/25/2018   Procedure: ESOPHAGOGASTRODUODENOSCOPY (EGD) WITH PROPOFOL;  Surgeon: Lin Landsman, MD;  Location: [facility] ENDOSCOPY;  Service: Gastroenterology;  Laterality: N/A;  . ESOPHAGOGASTRODUODENOSCOPY (EGD) WITH PROPOFOL N/A 04/22/2018   Procedure: ESOPHAGOGASTRODUODENOSCOPY (EGD) WITH PROPOFOL with band ligation;  Surgeon: Lin Landsman, MD;  Location: [facility];  Service: Gastroenterology;  Laterality: N/A;  . ESOPHAGOGASTRODUODENOSCOPY (EGD) WITH PROPOFOL N/A 06/24/2018   Procedure: ESOPHAGOGASTRODUODENOSCOPY (EGD) WITH PROPOFOL;  Surgeon: Lin Landsman, MD;  Location: [facility] ENDOSCOPY;  Service: Gastroenterology;  Laterality: N/A;  . ESOPHAGOGASTRODUODENOSCOPY (EGD) WITH PROPOFOL N/A 11/19/2018   Procedure: ESOPHAGOGASTRODUODENOSCOPY (EGD) WITH PROPOFOL;  Surgeon: Lin Landsman, MD;  Location: [facility] ENDOSCOPY;  Service: Gastroenterology;  Laterality: N/A;  . ESOPHAGOGASTRODUODENOSCOPY (EGD) WITH PROPOFOL N/A 03/08/2020   Procedure: ESOPHAGOGASTRODUODENOSCOPY (EGD) WITH PROPOFOL;  Surgeon: Lin Landsman, MD;  Location: [facility] ENDOSCOPY;  Service: Gastroenterology;  Laterality: N/A;  . EYE SURGERY    . GIVENS CAPSULE STUDY N/A 03/29/2020   Procedure: GIVENS CAPSULE STUDY;  Surgeon: Lin Landsman, MD;  Location: [facility] ENDOSCOPY;  Service: Gastroenterology;  Laterality: N/A;  . HEMORRHOID BANDING  03/25/2018   Procedure: HEMORRHOID BANDING;  Surgeon: Lin Landsman, MD;  Location: [facility] ENDOSCOPY;  Service: Gastroenterology;;  . IR EMBO ART   VEN HEMORR LYMPH EXTRAV  INC GUIDE ROADMAPPING  08/07/2018  . IR RADIOLOGIST EVAL & MGMT  09/03/2018  . IR RADIOLOGIST EVAL & MGMT  12/17/2018  . IR RADIOLOGIST EVAL & MGMT  06/24/2019  . IR RADIOLOGIST EVAL & MGMT  01/22/2020  . IR TIPS  08/07/2018  . PORTA CATH INSERTION N/A 07/30/2020   Procedure: PORTA CATH INSERTION;  Surgeon: Katha Cabal, MD;  Location: [place] [facility] CV LAB;  Service: Cardiovascular;  Laterality: N/A;  . RADIOLOGY WITH ANESTHESIA N/A 08/07/2018   Procedure: TIPS;  Surgeon: Corrie Mckusick, DO;  Location: [place];  Service: Anesthesiology;  Laterality: N/A;  . TONSILLECTOMY      Family History  Problem Relation Age of Onset  . CAD Father   . Heart attack Father 73  . Cancer Maternal Uncle   . Seizures Mother     Social History:  reports that she has never smoked. She has never used smokeless tobacco. She reports previous alcohol use. She reports that she does not use drugs. She retired from [employer] in 11/2017. She lives in [place]. The patient is alone today.  Allergies: No Known Allergies  Current Medications: Current Outpatient Medications  Medication Sig Dispense Refill  . cyanocobalamin (,VITAMIN B-12,) 1000 MCG/ML injection Inject 1,000 mcg into the muscle every 30 (thirty) days.     . furosemide (LASIX) 80 MG tablet Take 87m (one tablet) in the morning daily with an  additional 39m tablet as needed for weight gain of 2 pounds overnight or 5 pounds in week. 180 tablet 1  . hydrOXYzine (ATARAX/VISTARIL) 25 MG tablet Take 25 mg by mouth 3 (three) times daily.    .Marland KitchenKLOR-CON M10 10 MEQ tablet TAKE 10 MG (1 TABLET DAILY) DAILY . ALTERNATING DAYS TAKE 15 MG (1 AND 1/2 TABLET) DAILY 38 tablet 3  . lactulose (CHRONULAC) 10 GM/15ML solution SMARTSIG:Milliliter(s) By Mouth    . Misc. Devices KIT Moderate compression hose 20-30 MM HG 1 kit 0  . nystatin (MYCOSTATIN/NYSTOP) powder Apply 1 g topically daily as needed (rash).    . ondansetron (ZOFRAN) 4 MG tablet Take  4 mg by mouth every 8 (eight) hours as needed for nausea or vomiting.     .Glory RosebushDelica Lancets 301UMISC 2 (two) times daily.    .Glory RosebushULTRA test strip 2 (two) times daily.    . pantoprazole (PROTONIX) 40 MG tablet Take 40 mg by mouth 2 (two) times daily.    . potassium chloride (KLOR-CON) 10 MEQ tablet Take 10 mEq by mouth daily.    .Marland Kitchenspironolactone (ALDACTONE) 100 MG tablet TAKE 1 TABLET BY MOUTH TWICE A DAY 60 tablet 2  . Vitamin D, Ergocalciferol, (DRISDOL) 1.25 MG (50000 UNIT) CAPS capsule Take 50,000 Units by mouth once a week.    .Marland KitchenXIFAXAN 550 MG TABS tablet Take 550 mg by mouth 2 (two) times daily.     No current facility-administered medications for this visit.   Facility-Administered Medications Ordered in Other Visits  Medication Dose Route Frequency Provider Last Rate Last Admin  . sodium chloride flush (NS) 0.9 % injection 10 mL  10 mL Intravenous PRN CNolon StallsC, MD   10 mL at 09/14/20 1320    Review of Systems  Constitutional: Negative for chills, diaphoresis, fever, malaise/fatigue and weight loss (up 8 lbs).       Feels "okay."  HENT: Negative for congestion, ear discharge, ear pain, hearing loss, nosebleeds, sinus pain, sore throat and tinnitus.   Eyes: Negative for blurred vision and double vision.  Respiratory: Positive for shortness of breath (on exertion). Negative for cough, hemoptysis and sputum production.   Cardiovascular: Positive for leg swelling (legs and abdomen). Negative for chest pain and palpitations.  Gastrointestinal: Negative for abdominal pain, blood in stool, constipation, diarrhea, heartburn, melena, nausea and vomiting.  Genitourinary: Negative for dysuria, frequency, hematuria and urgency.  Musculoskeletal: Negative for back pain, joint pain, myalgias and neck pain.  Skin: Negative for itching and rash.       Raynaud's.  Neurological: Negative for dizziness, tingling, sensory change, weakness and headaches.  Endo/Heme/Allergies:  Does not bruise/bleed easily.       Diabetes, no meds  Psychiatric/Behavioral: Negative for depression and memory loss. The patient is not nervous/anxious and does not have insomnia.   All other systems reviewed and are negative.  Performance status (ECOG): 1  Vital Signs Blood pressure 108/66, pulse 73, temperature (!) 96 F (35.6 C), temperature source Tympanic, resp. rate 18, weight 196 lb 3.4 oz (89 kg).   Physical Exam Vitals and nursing note reviewed.  Constitutional:      General: She is not in acute distress.    Appearance: Normal appearance. She is well-developed. She is not diaphoretic.     Interventions: Face mask in place.  HENT:     Head: Normocephalic and atraumatic.     Comments: Short white hair.    Mouth/Throat:  Mouth: Mucous membranes are moist.     Pharynx: Oropharynx is clear.  Eyes:     General: No scleral icterus.    Extraocular Movements: Extraocular movements intact.     Conjunctiva/sclera: Conjunctivae normal.     Pupils: Pupils are equal, round, and reactive to light.     Comments: Glasses. Blue eyes.  Neck:     Vascular: No JVD.  Cardiovascular:     Rate and Rhythm: Normal rate and regular rhythm.     Heart sounds: Normal heart sounds. No murmur heard. No friction rub. No gallop.   Pulmonary:     Effort: Pulmonary effort is normal. No respiratory distress.     Breath sounds: Normal breath sounds. No wheezing, rhonchi or rales.  Chest:     Chest wall: No tenderness.  Breasts:     Right: No axillary adenopathy or supraclavicular adenopathy.     Left: No axillary adenopathy or supraclavicular adenopathy.    Abdominal:     General: Bowel sounds are normal. There is no distension.     Palpations: Abdomen is soft. There is no mass.     Tenderness: There is no abdominal tenderness. There is no guarding or rebound.  Musculoskeletal:        General: No swelling or tenderness. Normal range of motion.     Cervical back: Normal range of motion and  neck supple.     Right lower leg: Edema (dense swelling up to thighs) present.     Left lower leg: Edema (dense swelling up to thighs) present.  Lymphadenopathy:     Head:     Right side of head: No preauricular, posterior auricular or occipital adenopathy.     Left side of head: No preauricular, posterior auricular or occipital adenopathy.     Cervical: No cervical adenopathy.     Upper Body:     Right upper body: No supraclavicular or axillary adenopathy.     Left upper body: No supraclavicular or axillary adenopathy.     Lower Body: No right inguinal adenopathy. No left inguinal adenopathy.  Skin:    General: Skin is warm and dry.     Coloration: Skin is not pale.     Findings: No erythema.     Comments: Dense area on the back of right calf. No redness. Not warm to the touch.  Neurological:     Mental Status: She is alert and oriented to person, place, and time.  Psychiatric:        Behavior: Behavior normal.        Thought Content: Thought content normal.        Judgment: Judgment normal.    Appointment on 02/01/2021  Component Date Value Ref Range Status  . WBC 02/01/2021 4.8  4.0 - 10.5 K/uL Final  . RBC 02/01/2021 3.78* 3.87 - 5.11 MIL/uL Final  . Hemoglobin 02/01/2021 9.8* 12.0 - 15.0 g/dL Final  . HCT 02/01/2021 30.3* 36.0 - 46.0 % Final  . MCV 02/01/2021 80.2  80.0 - 100.0 fL Final  . MCH 02/01/2021 25.9* 26.0 - 34.0 pg Final  . MCHC 02/01/2021 32.3  30.0 - 36.0 g/dL Final  . RDW 02/01/2021 16.8* 11.5 - 15.5 % Final  . Platelets 02/01/2021 108* 150 - 400 K/uL Final   Comment: Immature Platelet Fraction may be clinically indicated, consider ordering this additional test [id number]   . nRBC 02/01/2021 0.0  0.0 - 0.2 % Final   Performed at [facility], [place]  [address] [place], [place] [address]  . Sodium 02/01/2021 130* 135 - 145 mmol/L Final  . Potassium 02/01/2021 3.5  3.5 - 5.1 mmol/L Final  . Chloride 02/01/2021 102  98 - 111 mmol/L Final  . CO2  02/01/2021 19* 22 - 32 mmol/L Final  . Glucose, Bld 02/01/2021 269* 70 - 99 mg/dL Final   Glucose reference range applies only to samples taken after fasting for at least 8 hours.  . BUN 02/01/2021 29* 8 - 23 mg/dL Final  . Creatinine, Ser 02/01/2021 2.21* 0.44 - 1.00 mg/dL Final  . Calcium 02/01/2021 8.3* 8.9 - 10.3 mg/dL Final  . GFR, Estimated 02/01/2021 24* >60 mL/min Final   Comment: (NOTE) Calculated using the CKD-EPI Creatinine Equation (2021)   . Anion gap 02/01/2021 9  5 - 15 Final   Performed at [facility], [address], [place], [place] [address]    Assessment:  Crystal [name] is a 70 y.o. female with a right lower lobe pulmonary nodule. She denies any smoking history.  Abdomen and pelvic CTon 02/09/2018 revealed an 1.8 cm spiculated density in right lower lobe concerning for malignancy. There was mildly nodular hepatic contourconcerning for hepatic cirrhosis. There was moderate splenomegalysuggesting portal venous hypertension. Mild ascites was noted. There were mildly enlarged retroperitoneal lymph nodes (largest 1.1 cm) concerning for possible metastatic disease or malignancy. There was a 7 cm left ovarian cyst. Further evaluation with MRI was recommended to evaluate for possible neoplasm. CA125was 351.3 and CEA1.1 on 02/21/2018.   Chest CTon 02/25/2018 revealed a 2.3 x 1.5 x 1.0 cm perifissural nodulewith spiculated margins in the right middle lobe. There was a 1.5 x 1.5 x 1.4 cm right lower lobe spiculated nodule. There was a moderate left sided pleural effusion.   PET scanon 03/08/2018 revealed a 2.5 x 1.8 cm sub solid nodular opacity in theRML(SUV 2.7) and a 1.6 cm roundedslightly spiculated RLL pulmonary nodule(SUV 2.1). There were no enlarged or hypermetabolic mediastinal or hilar lymph nodes. There was a small left pleural effusion. There was cirrhosis with portal venous hypertension, portal venous collaterals, splenomegaly and upper  abdominal lymphadenopathy.  Chest CTon 05/15/2018 revealed no significant changes in the 2 previously noted nodules in the RIGHT lung. Irregular RML lesion measures 2.4 x 1.4 cm (previously 2.3 x 1.5 cm). Spiculated central RLL lesion measures 1.8 x 1.4 cm (previously 1.6 x 1.5 cm). Both areas remain concerning for neoplasm. There was interval decrease in ascites volume overall. Upper abdominal lymphadenopathy noted that was felt to be reactive due to underlying liver disease.   Chest CTon 08/15/2018 revealed the spiculated right lower lobe lung lesion appeared slightly increased and was not significantly changed in size compared with previous exam. Morphologically, this was worrisome for a small bronchogenic neoplasm. The right middle lobe lung nodule was slightly decreased in size in the interval and may be post infectious or inflammatory in etiology.  Super D chest CTon 01/24/2019 revealed a 3.2 x 2.3 cm ill-defined anterior right lung nodule compared to 2.9 x 2.3 cm previously. Minor fissurewasnot well, and as such, this nodule can not be confidently localized to the right upper or middle lobe. The right lower lobe nodule measured2.0 x 2.0 cm comparedto 2.0 x 1.9 cm.  Chest CTon 08/07/2019 revealed no significant interval change in size right middle lobe and right lower lobe lung nodules.Liverwascompatible with cirrhosiss/pTIPS.Chest CTon 02/02/2020 revealed stable nodules within the right middle and right lower lobes.Therewerecirrhotic changesof the liver with TIPS shunt in place. There  was mild prominence of the ascending aorta to 4 cm. Recommendedannual imaging followup by CTA or MRA.  She was [facility] from [facility] - 04/02/2019with a variceal hemorrhage. EGDon 02/10/2018 revealed 5 columns of grade III esophageal varices in the lower third of the esophagus. There was stigmata of recent bleeding and red wale signs. She underwent variceal ligation x 10.  She required 3 units of PRBCs.  EGDon 03/25/2018 revealed 2 angiectasias(non-bleeding) in the second portion of the duodenum, and a few diminutive (non-bleeding) angiectasias in the prepyloric region of the stomach that were treated APC. There were 4 non-bleeding ulcers (Forrest Class III) found in the gastric fundus. There were 4 columns of large non-bleeding varicesin the lower third of the esophagus, of which demonstrated no stigmata of bleeding. Varices were banded. EGD on 04/22/2018 revealed one non-bleeding cratered gastric ulcer(Forrest Class III) in the lesser curvature of the gastric body. Duodenal bulb and second portion of the duodenum were normal. Moderate portal hypertensive gastropathy noted in the stomach. Large varices in the lower third of the esophagus noted. 2 bands were placed with incomplete eradication of the lesions. EGD on 03/08/2020 revealed a few non-bleeding angioectasias in the duodenum and a gastric antral vascular ectasia with bleeding treated with argon plasma coagulation (APC).  There was a single non-bleeding angioectasia in the stomach treated with bipolar cautery. Gastroesophageal junction and esophagus were normal. No specimens were collected.  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  Colonoscopyon 03/25/2018 revealed a single 5 mm sessile polyp in the ascending colon. Patient has rectal varices and external hemorrhoids. Pathology returned as tubular adenoma, and was negative for high grade dysplasia and malignancy.   She has iron deficiency anemia. Ferritinwas 6. She is on ferrous sulfate 325 mg TID. She hasB12 deficiency. B12was 103. Intrinsic factor antibody was negative. Anti-parietal cell antibody was elevated  (45.6) and c/w pernicious anemia. She began B12 injections on 03/06/2018 (last 12/09/2020). Folate was19.2on06/06/2020. Dietis good.  She received Venoferweekly x 4 (07/12/2018 - 07/31/2018), x 3 (12/22/2019 -01/05/2020), x 3 (03/30/2020 - 04/13/2020), 07/08/2020, 07/21/2020, x 3 (08/05/2020 - 08/19/2020), 01/04/2021, and 01/12/2021.  Ferritinhas been followed: 6 on 02/09/2018, 16 on 05/24/2018, 13 on 07/10/2018, 374 on 08/15/2018, 485 on 08/21/2018, 114 on 09/25/2018, 63 on 11/20/2018, 35 on 02/12/2019, 22 on 04/24/2019, 41 on 09/22/2019, 41 on 10/27/2019, 21 on 12/15/2019, 22 on 03/22/2020, 20 on 03/26/2020, 271 on 04/14/2020, 57 on 05/24/2020, 23 on 06/30/2020, 26 on 08/05/2020, 74 on 09/14/2020, 42 on 11/09/2020, and 28 on 01/03/2021.  She has hepatitis C antibodies. HCV RNA was not detected. She has been exposed to hepatitis B. Anti-smooth muscle antibodiesare weakly positive. ANAwas positive (>1:1280 centromere antibody). Negative studiesincluded: ceruloplasmin, anti-mitochondrial antibodies, hepatitis B IgM, HIV, and alpha-1 antitrypsin. PT was 15.2 (INR 1.21). AFPwas 1.5 on 05/28/2018,2.4 on 11/20/2018, 2.0 on 04/24/2019, and 1.8 on 09/14/2020.  She has a history of decompensated cirrhosiss/p TIPSprocedure 08/07/2018. She has portal hypertension and a history of recurrent bleeding esophageal varices.She was given a 2-year life expectancyfollowing her TIPS procedure. Abdominal/pelvic arterial/venous ultrasound doppleron 04/24/2019 revealed a patent TIPS and no evidence of persisting ascites.  AFP was 1.8 on 09/14/2020.  Abdominal MRI including MRCPon 11/12/2019 revealed cirrhosis with TIPS in place.There was no biliary duct dilatation.There was cholelithiasis without acute cholecystitis. Right hepatic lobe 9 mm lesion whichwas  indeterminate, LR 3.This was possibly present on the prior CT of 2019. Recommendation includedrepeat pre and post contrast  abdominal MRI at 3-6 months. There as small volume abdominal ascites.The right lower lobe pulmonary nodulewas suboptimally evaluated.  [facility] with[facility] mental status.HeadCT revealedno acute intracranial pathology. Head MRIwo contrast showed symmetric FLAIR hyperintensity within the bilateral mamillary bodies. This finding may be seen in the setting of Wernicke encephalopathy.There was no evidence of acute infarct and mild chronic small vessels with ischemic disease.Her mental status returned to baseline.  RUQ ultrasoundon 05/29/2018 revealed cholelithiasis. There was a thickened edematous gallbladder wall. The wall thickening could be due to hypoproteinemia/hypoalbuminemia or cholecystitis. Negative sonographic Murphy's sign. There was a heterogeneous slightly nodular liver consistent with cirrhosis.   Renal ultrasound on 06/15/2020 revealed marked splenomegaly (13.9 x 15.2 x 5.6 cm; 618 ml) similar to comparison study of 05/28/2020 and MR of the abdomen on 11/12/2019.  RUQ ultrasound on 01/20/2021 revealed cirrhotic liver morphology with no focal hepatic lesions.  She has a patent TIPS.  Pelvic ultrasoundon 03/18/2018 demonstrated a simple cyst in the LEFT ovarymeasuring 6.1 x 4.5 x 6.6 cm. Repeat imaging recommended in 1 year to document stability.  Patient declined follow-up.  The patient received the [place] COVID-19 vaccine on 12/26/2019 and 01/21/2020.  Symptomatically,  she has been "ok." She reports weight gain due to fluid. She is up 8 lbs since her last visit. She denies any bleeding.  Plan: 1.   Labs today: CBC, BMP, ferritin. 2.Decompensated cirrhosis s/p TIPS Clinically,she is having more issues with fluid retention. Life expectancy is limited s/pTIPS on 08/07/2018 (2 years). She denies any bleeding. AFPwas 1.8 on 09/14/2020. RUQ ultrasound on 01/20/2021 revealed cirrhotic liver morphology with no focal hepatic  lesions.  She has a patent TIPS.  Continue liver imaging every 6 months for [facility] surveillance.   3.Pulmonary nodules Chest CTon 02/02/2020 revealed stable nodules within the right middle and right lower lobes.  Follow-up chest CT on 02/01/2021. 4.Iron deficiency anemia Hematocrit 30.3. Hemoglobin 9.8.  MCV 80.2 on 02/01/2021.  Ferritin 92. Continue to monitor. 5.B12 deficiency Patient receives B12 monthly (last02/22/2022) B12 today and monthly x6. 6.   Fluid weight gain  Lasix 80 mg IV today. 7.   RTC as previously scheduled.  I discussed the assessment and treatment plan with the patient.  The patient was provided an opportunity to ask questions and all were answered.  The patient agreed with the plan and demonstrated an understanding of the instructions.  The patient was advised to call back if the symptoms worsen or if the condition fails to improve as anticipated.   Lequita Asal, MD, PhD    02/01/2021, 2:07 PM  I, Mirian Mo Tufford, am acting as Education administrator for [employer]. Mike Gip, MD, PhD.  I, Melissa C. Mike Gip, MD, have reviewed the above documentation for accuracy and completeness, and I agree with the above.

## 2021-02-01 ENCOUNTER — Inpatient Hospital Stay: Payer: Medicare HMO

## 2021-02-01 ENCOUNTER — Ambulatory Visit: Payer: Medicare HMO | Admitting: Family

## 2021-02-01 ENCOUNTER — Other Ambulatory Visit: Payer: Self-pay

## 2021-02-01 ENCOUNTER — Ambulatory Visit
Admission: RE | Admit: 2021-02-01 | Discharge: 2021-02-01 | Disposition: A | Discharge status: Home / Self Care | Payer: Medicare HMO | Source: Ambulatory Visit | Attending: Hematology and Oncology | Admitting: Hematology and Oncology

## 2021-02-01 ENCOUNTER — Other Ambulatory Visit: Payer: Self-pay | Admitting: Hematology and Oncology

## 2021-02-01 ENCOUNTER — Encounter: Payer: Self-pay | Admitting: Hematology and Oncology

## 2021-02-01 ENCOUNTER — Inpatient Hospital Stay (HOSPITAL_BASED_OUTPATIENT_CLINIC_OR_DEPARTMENT_OTHER): Payer: Medicare HMO | Admitting: Hematology and Oncology

## 2021-02-01 VITALS — BP 108/66 | HR 73 | Temp 96.0°F | Resp 18 | Wt 196.2 lb

## 2021-02-01 DIAGNOSIS — M25475 Effusion, left foot: Secondary | ICD-10-CM | POA: Diagnosis not present

## 2021-02-01 DIAGNOSIS — E1121 Type 2 diabetes mellitus with diabetic nephropathy: Secondary | ICD-10-CM | POA: Diagnosis not present

## 2021-02-01 DIAGNOSIS — D500 Iron deficiency anemia secondary to blood loss (chronic): Secondary | ICD-10-CM

## 2021-02-01 DIAGNOSIS — R918 Other nonspecific abnormal finding of lung field: Secondary | ICD-10-CM

## 2021-02-01 DIAGNOSIS — E559 Vitamin D deficiency, unspecified: Secondary | ICD-10-CM | POA: Diagnosis not present

## 2021-02-01 DIAGNOSIS — E538 Deficiency of other specified B group vitamins: Secondary | ICD-10-CM

## 2021-02-01 DIAGNOSIS — M25472 Effusion, left ankle: Secondary | ICD-10-CM | POA: Diagnosis not present

## 2021-02-01 DIAGNOSIS — M25471 Effusion, right ankle: Secondary | ICD-10-CM | POA: Diagnosis not present

## 2021-02-01 DIAGNOSIS — I2510 Atherosclerotic heart disease of native coronary artery without angina pectoris: Secondary | ICD-10-CM | POA: Diagnosis not present

## 2021-02-01 DIAGNOSIS — R911 Solitary pulmonary nodule: Secondary | ICD-10-CM

## 2021-02-01 DIAGNOSIS — R635 Abnormal weight gain: Secondary | ICD-10-CM | POA: Diagnosis not present

## 2021-02-01 DIAGNOSIS — I700 Atherosclerosis of aorta: Secondary | ICD-10-CM | POA: Diagnosis not present

## 2021-02-01 DIAGNOSIS — R609 Edema, unspecified: Secondary | ICD-10-CM

## 2021-02-01 DIAGNOSIS — E782 Mixed hyperlipidemia: Secondary | ICD-10-CM | POA: Diagnosis not present

## 2021-02-01 DIAGNOSIS — I10 Essential (primary) hypertension: Secondary | ICD-10-CM | POA: Diagnosis not present

## 2021-02-01 DIAGNOSIS — J9859 Other diseases of mediastinum, not elsewhere classified: Secondary | ICD-10-CM | POA: Diagnosis not present

## 2021-02-01 DIAGNOSIS — K7290 Hepatic failure, unspecified without coma: Secondary | ICD-10-CM | POA: Diagnosis not present

## 2021-02-01 DIAGNOSIS — J929 Pleural plaque without asbestos: Secondary | ICD-10-CM | POA: Diagnosis not present

## 2021-02-01 DIAGNOSIS — K7460 Unspecified cirrhosis of liver: Secondary | ICD-10-CM | POA: Diagnosis not present

## 2021-02-01 DIAGNOSIS — N184 Chronic kidney disease, stage 4 (severe): Secondary | ICD-10-CM | POA: Diagnosis not present

## 2021-02-01 DIAGNOSIS — M25474 Effusion, right foot: Secondary | ICD-10-CM | POA: Diagnosis not present

## 2021-02-01 DIAGNOSIS — D509 Iron deficiency anemia, unspecified: Secondary | ICD-10-CM | POA: Diagnosis not present

## 2021-02-01 LAB — CBC
HCT: 30.3 % — ABNORMAL LOW (ref 36.0–46.0)
Hemoglobin: 9.8 g/dL — ABNORMAL LOW (ref 12.0–15.0)
MCH: 25.9 pg — ABNORMAL LOW (ref 26.0–34.0)
MCHC: 32.3 g/dL (ref 30.0–36.0)
MCV: 80.2 fL (ref 80.0–100.0)
Platelets: 108 10*3/uL — ABNORMAL LOW (ref 150–400)
RBC: 3.78 MIL/uL — ABNORMAL LOW (ref 3.87–5.11)
RDW: 16.8 % — ABNORMAL HIGH (ref 11.5–15.5)
WBC: 4.8 10*3/uL (ref 4.0–10.5)
nRBC: 0 % (ref 0.0–0.2)

## 2021-02-01 LAB — FERRITIN: Ferritin: 92 ng/mL (ref 11–307)

## 2021-02-01 LAB — BASIC METABOLIC PANEL
Anion gap: 9 (ref 5–15)
BUN: 29 mg/dL — ABNORMAL HIGH (ref 8–23)
CO2: 19 mmol/L — ABNORMAL LOW (ref 22–32)
Calcium: 8.3 mg/dL — ABNORMAL LOW (ref 8.9–10.3)
Chloride: 102 mmol/L (ref 98–111)
Creatinine, Ser: 2.21 mg/dL — ABNORMAL HIGH (ref 0.44–1.00)
GFR, Estimated: 24 mL/min — ABNORMAL LOW (ref >60)
Glucose, Bld: 269 mg/dL — ABNORMAL HIGH (ref 70–99)
Potassium: 3.5 mmol/L (ref 3.5–5.1)
Sodium: 130 mmol/L — ABNORMAL LOW (ref 135–145)

## 2021-02-01 MED ORDER — CYANOCOBALAMIN 1000 MCG/ML IJ SOLN
1000.0000 ug | Freq: Once | INTRAMUSCULAR | Status: AC
  Administered 2021-02-01: 1000 ug via INTRAMUSCULAR
  Filled 2021-02-01: qty 1

## 2021-02-01 MED ORDER — FUROSEMIDE 10 MG/ML IJ SOLN
80.0000 mg | Freq: Once | INTRAMUSCULAR | Status: AC
  Administered 2021-02-01: 80 mg via INTRAVENOUS

## 2021-02-01 MED ORDER — FUROSEMIDE 10 MG/ML IJ SOLN
80.0000 mg | Freq: Once | INTRAMUSCULAR | Status: DC
  Filled 2021-02-01: qty 8

## 2021-02-01 MED ORDER — HEPARIN SOD (PORK) LOCK FLUSH 100 UNIT/ML IV SOLN
INTRAVENOUS | Status: AC
  Filled 2021-02-01: qty 5

## 2021-02-02 LAB — BASIC METABOLIC PANEL
BUN/Creatinine Ratio: 12 (ref 12–28)
BUN: 25 mg/dL (ref 8–27)
CO2: 16 mmol/L — ABNORMAL LOW (ref 20–29)
Calcium: 8.7 mg/dL (ref 8.7–10.3)
Chloride: 100 mmol/L (ref 96–106)
Creatinine, Ser: 2.05 mg/dL — ABNORMAL HIGH (ref 0.57–1.00)
Glucose: 115 mg/dL — ABNORMAL HIGH (ref 65–99)
Potassium: 4.4 mmol/L (ref 3.5–5.2)
Sodium: 135 mmol/L (ref 134–144)
eGFR: 26 mL/min/{1.73_m2} — ABNORMAL LOW (ref >59)

## 2021-02-02 LAB — MAGNESIUM: Magnesium: 2.3 mg/dL (ref 1.6–2.3)

## 2021-02-02 LAB — PHOSPHORUS: Phosphorus: 3.3 mg/dL (ref 3.0–4.3)

## 2021-02-08 ENCOUNTER — Telehealth: Payer: Self-pay | Admitting: Hematology and Oncology

## 2021-02-08 ENCOUNTER — Inpatient Hospital Stay: Payer: Medicare HMO

## 2021-02-08 NOTE — Telephone Encounter (Addendum)
02/08/2021 Left VM informing pt Dr. Loletha Grayer wanted to do a telephone f/u to go over imaging results. Virtual office visit made for 02/11/21. Call back number left in case there are any questions or scheduling concerns  SRW    ----- Message from Lequita Asal, MD sent at 02/07/2021  4:12 PM EDT ----- Regarding: Please schedule appt  Please schedule phone or video follow-up to discuss recent chest CT.  M  ----- Message ----- From: Interface, Rad Results In Sent: 02/02/2021   1:06 PM EDT To: Lequita Asal, MD

## 2021-02-09 ENCOUNTER — Ambulatory Visit
Admission: RE | Admit: 2021-02-09 | Discharge: 2021-02-09 | Disposition: A | Discharge status: Home / Self Care | Payer: Medicare HMO | Source: Ambulatory Visit | Attending: Interventional Radiology | Admitting: Interventional Radiology

## 2021-02-09 ENCOUNTER — Other Ambulatory Visit: Payer: Self-pay

## 2021-02-09 DIAGNOSIS — K7460 Unspecified cirrhosis of liver: Secondary | ICD-10-CM | POA: Diagnosis not present

## 2021-02-09 DIAGNOSIS — I8510 Secondary esophageal varices without bleeding: Secondary | ICD-10-CM | POA: Diagnosis not present

## 2021-02-09 DIAGNOSIS — K766 Portal hypertension: Secondary | ICD-10-CM | POA: Diagnosis not present

## 2021-02-09 DIAGNOSIS — Z9689 Presence of other specified functional implants: Secondary | ICD-10-CM | POA: Diagnosis not present

## 2021-02-09 HISTORY — PX: IR RADIOLOGIST EVAL & MGMT: IMG5224

## 2021-02-09 LAB — COMPLETE METABOLIC PANEL WITH GFR
AG Ratio: 0.8 (calc) — ABNORMAL LOW (ref 1.0–2.5)
ALT: 12 U/L (ref 6–29)
AST: 26 U/L (ref 10–35)
Albumin: 2.5 g/dL — ABNORMAL LOW (ref 3.6–5.1)
Alkaline phosphatase (APISO): 150 U/L (ref 37–153)
BUN/Creatinine Ratio: 12 (calc) (ref 6–22)
BUN: 27 mg/dL — ABNORMAL HIGH (ref 7–25)
CO2: 22 mmol/L (ref 20–32)
Calcium: 8.8 mg/dL (ref 8.6–10.4)
Chloride: 102 mmol/L (ref 98–110)
Creat: 2.2 mg/dL — ABNORMAL HIGH (ref 0.50–0.99)
GFR, Est African American: 26 mL/min/{1.73_m2} — ABNORMAL LOW (ref >=60)
GFR, Est Non African American: 22 mL/min/{1.73_m2} — ABNORMAL LOW (ref >=60)
Globulin: 3.3 g/dL (calc) (ref 1.9–3.7)
Glucose, Bld: 174 mg/dL — ABNORMAL HIGH (ref 65–139)
Potassium: 4.7 mmol/L (ref 3.5–5.3)
Sodium: 134 mmol/L — ABNORMAL LOW (ref 135–146)
Total Bilirubin: 1.5 mg/dL — ABNORMAL HIGH (ref 0.2–1.2)
Total Protein: 5.8 g/dL — ABNORMAL LOW (ref 6.1–8.1)

## 2021-02-09 NOTE — Progress Notes (Signed)
Chief Complaint: Portal HTN, TIPS  Referring Physician(s): Dr. Marius Ditch, GI Dr. Lorelee Market, PCP, Tyler Deis, [place] Dr. Mike Gip, Oncology  History of Present Illness: Crystal [name] a 70 y.o.femalepresenting today as a scheduled follow up to [place] clinic, SP TIPS.  She joins [place] today by herself in the clinic.   She is a very pleasant lady with history of cirrhosis and HCV, SP TIPS performed emergently as inpt, 08/07/2018. The indication was recurrent EV bleeding, refractory to banding.   She denies any recurrent episodes of GI bleeding.  The last endoscopy that I can see was performed 07/28/20, with treatment of multiple non-bleeding angiodysplasia of small bowel.  There was no mention of +/- portal gastropathy, so I suspect negative.   She does report some swelling of the bilateral lower extremity that is symmetric and some days worse than others.  She tells me that it has been present for about 1 month now.  She does not recall if the swelling started before or after the ECHO that was performed on 12/14/20.    This ECHO reports a normal EF, with no report of right ventricular dysfunction, and with preserved left heart function.    She says she has been treated with lasix, with some positive results.  She also has been wearing compression stockings and says that these help somewhat too, but are difficult to get on and off.  She is not wearing them right now, as she did not know if she would be required to take them on and off at the visit.   She never has had any need for paracentesis. US of the abdomen recently was negative for ascites.   Repeat TIPS duplex was performed today, with comparison 01/20/20.  Although the study is limited today, the TIPS is patent with essentially unchanged appearance of the velocities.   She does tell me that she occasionally gets some confusion, but recognizes it and takes more lactulose.  She says she is taking 1-2 does daily, with 1-2 BM's daily.      One of the final comments she made to me today is regarding some "itchiness".  This is worst on her back, but she feels this everywhere.   The last CMP that I see in the record is 04/22/20.  At that time: Cr: 1.65 Albumin: 2.8 AST & ALT normal Tbili: 1.6 Estimated GFR = 32  Past Medical History:  Diagnosis Date  . Acute GI bleeding 02/09/2018  . Acute upper gastrointestinal bleeding 08/03/2018  . Anemia    TAKES IRON TAB  . Arthritis    SHOULDER  . B12 deficiency 02/18/2018  . Bleeding    GI  3/19  . Bronchitis    HX OF  . CHF (congestive heart failure) ([place])   . Cirrhosis ([place])   . Colon cancer screening   . Diabetes mellitus without complication ([place])    TYPE 2  . Fatty (change of) liver, not elsewhere classified   . Full dentures    UPPER AND LOWER  . Iron deficiency anemia due to chronic blood loss 02/18/2018  . Lung nodule, multiple   . Pernicious anemia 02/22/2018  . PUD (peptic ulcer disease)   . Raynaud's syndrome   . Upper GI bleeding 08/03/2018    Past Surgical History:  Procedure Laterality Date  . CATARACT EXTRACTION W/PHACO Right 02/06/2018   Procedure: CATARACT EXTRACTION PHACO AND INTRAOCULAR LENS PLACEMENT ([place]) RIGHT DIABETIC;  Surgeon: Leandrew Koyanagi, MD;  Location: [place];  Service: Ophthalmology;  Laterality: Right;  DIABETIC, ORAL MED  . CATARACT EXTRACTION W/PHACO Left 04/30/2018   Procedure: CATARACT EXTRACTION PHACO AND INTRAOCULAR LENS PLACEMENT (I[facility]);  Surgeon: Leandrew Koyanagi, MD;  Location: [facility];  Service: Ophthalmology;  Laterality: Left;  [place] 01:04 AP% 21.3 CDE 13.66 Fluid pack lot # [id number]  . COLONOSCOPY WITH PROPOFOL N/A 03/25/2018   Procedure: COLONOSCOPY WITH PROPOFOL;  Surgeon: Lin Landsman, MD;  Location: [facility] ENDOSCOPY;  Service: Gastroenterology;  Laterality: N/A;  . DENTAL SURGERY     EXTRACTIONS  . ELECTROMAGNETIC NAVIGATION BROCHOSCOPY Right 01/27/2019   Procedure: ELECTROMAGNETIC NAVIGATION  BRONCHOSCOPY;  Surgeon: [name], MD;  Location: [facility];  Service: Cardiopulmonary;  Laterality: Right;  . ENTEROSCOPY N/A 07/28/2020   Procedure: Push ENTEROSCOPY;  Surgeon: Lin Landsman, MD;  Location: [facility] ENDOSCOPY;  Service: Gastroenterology;  Laterality: N/A;  . ESOPHAGOGASTRODUODENOSCOPY N/A 08/03/2018   Procedure: ESOPHAGOGASTRODUODENOSCOPY (EGD);  Surgeon: Lin Landsman, MD;  Location: [facility] ENDOSCOPY;  Service: Gastroenterology;  Laterality: N/A;  . ESOPHAGOGASTRODUODENOSCOPY (EGD) WITH PROPOFOL N/A 02/10/2018   Procedure: ESOPHAGOGASTRODUODENOSCOPY (EGD) WITH PROPOFOL;  Surgeon: Jonathon Bellows, MD;  Location: [facility] ENDOSCOPY;  Service: Gastroenterology;  Laterality: N/A;  . ESOPHAGOGASTRODUODENOSCOPY (EGD) WITH PROPOFOL N/A 03/25/2018   Procedure: ESOPHAGOGASTRODUODENOSCOPY (EGD) WITH PROPOFOL;  Surgeon: Lin Landsman, MD;  Location: [facility] ENDOSCOPY;  Service: Gastroenterology;  Laterality: N/A;  . ESOPHAGOGASTRODUODENOSCOPY (EGD) WITH PROPOFOL N/A 04/22/2018   Procedure: ESOPHAGOGASTRODUODENOSCOPY (EGD) WITH PROPOFOL with band ligation;  Surgeon: Lin Landsman, MD;  Location: [place];  Service: Gastroenterology;  Laterality: N/A;  . ESOPHAGOGASTRODUODENOSCOPY (EGD) WITH PROPOFOL N/A 06/24/2018   Procedure: ESOPHAGOGASTRODUODENOSCOPY (EGD) WITH PROPOFOL;  Surgeon: Lin Landsman, MD;  Location: [facility] ENDOSCOPY;  Service: Gastroenterology;  Laterality: N/A;  . ESOPHAGOGASTRODUODENOSCOPY (EGD) WITH PROPOFOL N/A 11/19/2018   Procedure: ESOPHAGOGASTRODUODENOSCOPY (EGD) WITH PROPOFOL;  Surgeon: Lin Landsman, MD;  Location: [facility] ENDOSCOPY;  Service: Gastroenterology;  Laterality: N/A;  . ESOPHAGOGASTRODUODENOSCOPY (EGD) WITH PROPOFOL N/A 03/08/2020   Procedure: ESOPHAGOGASTRODUODENOSCOPY (EGD) WITH PROPOFOL;  Surgeon: Lin Landsman, MD;  Location: [facility] END[facility]COPY;  Service: Gastroenterology;  Laterality: N/A;  . EYE SURGERY    . GIVENS CAPSULE STUDY N/A  03/29/2020   Procedure: GIVENS CAPSULE STUDY;  Surgeon: Lin Landsman, MD;  Location: [facility] ENDOSCOPY;  Service: Gastroenterology;  Laterality: N/A;  . HEMORRHOID BANDING  03/25/2018   Procedure: HEMORRHOID BANDING;  Surgeon: Lin Landsman, MD;  Location: [facility] ENDOSCOPY;  Service: Gastroenterology;;  . IR EMBO ART  VEN HEMORR LYMPH EXTRAV  INC GUIDE ROADMAPPING  08/07/2018  . IR RADIOLOGIST EVAL & MGMT  09/03/2018  . IR RADIOLOGIST EVAL & MGMT  12/17/2018  . IR RADIOLOGIST EVAL & MGMT  06/24/2019  . IR RADIOLOGIST EVAL & MGMT  01/22/2020  . IR TIPS  08/07/2018  . PORTA CATH INSERTION N/A 07/30/2020   Procedure: PORTA CATH INSERTION;  Surgeon: Katha Cabal, MD;  Location: [place] CV LAB;  Service: Cardiovascular;  Laterality: N/A;  . RADIOLOGY WITH ANESTHESIA N/A 08/07/2018   Procedure: TIPS;  Surgeon: Corrie Mckusick, DO;  Location: [place];  Service: Anesthesiology;  Laterality: N/A;  . TONSILLECTOMY      Allergies: Patient has no known allergies.  Medications: Prior to Admission medications   Medication Sig Start Date End Date Taking? Authorizing Provider  cyanocobalamin (,VITAMIN B-12,) 1000 MCG/ML injection Inject 1,000 mcg into the muscle every 30 (thirty) days.     [provider]  furosemide (LASIX) 80 MG tablet Take 50m (one  tablet) in the morning daily with an additional 49m tablet as needed for weight gain of 2 pounds overnight or 5 pounds in week. 01/06/21   WLoel Dubonnet NP  hydrOXYzine (ATARAX/VISTARIL) 25 MG tablet Take 25 mg by mouth 3 (three) times daily. 11/21/20   [provider]  KLOR-CON M10 10 MEQ tablet TAKE 10 MG (1 TABLET DAILY) DAILY . ALTERNATING DAYS TAKE 15 MG (1 AND 1/2 TABLET) DAILY 01/21/21   WLoel Dubonnet NP  lactulose ([facility] 10 GM/15ML solution SMARTSIG:Milliliter(s) By Mouth 01/08/21   [provider]  Misc. Devices KIT Moderate compression hose 20-30 MM HG 10/14/19   Vanga, RTally Due MD  nystatin  (MYCOSTATIN/NYSTOP) powder Apply 1 g topically daily as needed (rash). 05/23/18   [provider]  ondansetron (ZOFRAN) 4 MG tablet Take 4 mg by mouth every 8 (eight) hours as needed for nausea or vomiting.     [provider]  OneTouch Delica Lancets 360YMISC 2 (two) times daily. 12/01/19   [provider]  [facility] test strip 2 (two) times daily. 11/23/19   [provider]  pantoprazole (PROTONIX) 40 MG tablet Take 40 mg by mouth 2 (two) times daily. 06/29/19   [provider]  potassium chloride (KLOR-CON) 10 MEQ tablet Take 10 mEq by mouth daily. 01/31/21   [provider]  spironolactone (ALDACTONE) 100 MG tablet TAKE 1 TABLET BY MOUTH TWICE A DAY 01/24/21   Vanga, RTally Due MD  Vitamin D, Ergocalciferol, (DRISDOL) 1.25 MG (50000 UNIT) CAPS capsule Take 50,000 Units by mouth once a week. 01/14/21   [provider]  XIFAXAN 550 MG TABS tablet Take 550 mg by mouth 2 (two) times daily. 06/24/20   [provider]     Family History  Problem Relation Age of Onset  . CAD Father   . Heart attack Father 450 . Cancer Maternal Uncle   . Seizures Mother     Social History   Socioeconomic History  . Marital status: Single    Spouse name: Not on file  . Number of children: Not on file  . Years of education: Not on file  . Highest education level: Not on file  Occupational History  . Not on file  Tobacco Use  . Smoking status: Never Smoker  . Smokeless tobacco: Never Used  Vaping Use  . Vaping Use: Never used  Substance and Sexual Activity  . Alcohol use: Not Currently    Comment: No EtOH for 30 years.  Never a heavy drinker.  . Drug use: Never  . Sexual activity: Not on file  Other Topics Concern  . Not on file  Social History Narrative   Independent at baseline. Lives at home with family   Social Determinants of Health   Financial Resource Strain: Not on file  Food Insecurity: Not on file  Transportation  Needs: Not on file  Physical Activity: Not on file  Stress: Not on file  Social Connections: Not on file       Review of Systems: A 12 point ROS discussed and pertinent positives are indicated in the HPI above.  All other systems are negative.  Review of Systems  Vital Signs: LMP  (LMP Unknown)   Physical Exam General: 70yo female appearing stated age.  Well-developed, well-nourished.  No distress. HEENT: Atraumatic, normocephalic.  Conjugate gaze, extra-ocular motor intact. No scleral icterus or scleral injection. No lesions on external ears, nose, lips, or gums.  Oral mucosa  moist, pink. Skin: No jaundice.  She does show me a few area of excoriation from scratching her back.  Neck: Symmetric with no goiter enlargement.  Chest/Lungs:  Symmetric chest with inspiration/expiration.  No labored breathing.  Clear to auscultation with no wheezes, rhonchi, or rales.  Heart:  No JVD appreciated.  Abdomen:  Soft, NT/ND.  No fluid wave appreciated Genito-urinary: Deferred Neurologic: Alert & Oriented to person, place, and time.   Normal affect and insight.  Appropriate questions.  Moving all 4 extremities with gross sensory intact.      Mallampati Score:     Imaging: CT CHEST WO CONTRAST  Result Date: 02/02/2021 CLINICAL DATA:  Abnormal x-ray, nodule follow-up in a 70 year old female. EXAM: CT CHEST WITHOUT CONTRAST TECHNIQUE: Multidetector CT imaging of the chest was performed following the standard protocol without IV contrast. COMPARISON:  CT chest from February 02, 2020 FINDINGS: Cardiovascular: RIGHT IJ Port-A-Cath terminates at the distal aspect of the superior vena cava. Three-vessel coronary artery disease. Stable appearance of the thoracic aorta and central pulmonary vasculature. Central pulmonary artery measuring 3.5 cm greatest dimension. Limited assessment of cardiovascular structures given lack of intravenous contrast. Heart size normal without substantial pericardial effusion.  Mediastinum/Nodes: Patulous fluid-filled esophagus. Only mildly dilated. No thoracic inlet lymphadenopathy. No mediastinal lymphadenopathy. No axillary lymphadenopathy. No gross hilar lymphadenopathy. Lungs/Pleura: Increasing size in the axial plane of a nodule in the RIGHT upper lobe, measuring 4.9 x 2.0 cm greatest axial dimension, previously approximately 2.4 x 2.7 cm. This could in part be due to nodule position and craniocaudal orientation. When measured in the coronal plane on today's study it measures approximately 3.6 as compared to 3.6 cm greatest dimension but shows further thickening of the adjacent pleural surface of the minor fissure (image 38, series 5) 7 mm thickness previously 1-2 mm thickness. Spiculated nodule in the RIGHT lower lobe on image 88 of series 3 measures 2.4 x 1.6 cm previously 2.5 x 1.9 cm. No effusion. No consolidation. No new nodule. Upper Abdomen: Incidental imaging of upper abdominal contents with changes of tips and splenomegaly. No acute upper abdominal process to the extent visualized. Musculoskeletal: Spinal degenerative changes. No acute or destructive bone process. IMPRESSION: 1. Increasing size of a nodule along the minor fissure in the RIGHT chest with further fissural thickening extending from this area on sagittal and coronal images in particular. Is craniocaudal in sagittal plane at 1.6 as compared to 1.8 cm. Overall findings are of uncertain significance. Given increased fissural thickening short interval follow-up or even PET evaluation could potentially be of benefit. Correlation with previous biopsy results is suggested to determine whether further sampling may be warranted. Morphologic features could be seen in primary pulmonary neoplasm. 2. Spiculated nodule in the RIGHT lower lobe on image 88 of series 3 measures 2.4 x 1.6 cm previously 2.5 x 1.9 cm. 3. Findings of liver disease, tips and splenomegaly. 4. Three-vessel coronary artery disease. 5. Patulous  fluid-filled esophagus. 6. Central pulmonary artery measuring 3.5 cm greatest dimension, can be seen in the setting of pulmonary arterial hypertension. 7. Aortic atherosclerosis. Aortic Atherosclerosis (ICD10-I70.0). Electronically Signed   By: Zetta Bills M.D.   On: 02/02/2021 11:24   US Abdomen Limited RUQ (LIVER/GB)  Result Date: 01/21/2021 CLINICAL DATA:  cirrhosis EXAM: ULTRASOUND ABDOMEN LIMITED RIGHT UPPER QUADRANT COMPARISON:  05/28/2020 and prior. FINDINGS: Gallbladder: The gallbladder wall is diffusely prominent. Intraluminal calculi measuring up to 8 mm. No sonographic Murphy sign noted by sonographer. Common bile duct: Diameter: 2.7 mm.  Liver: No focal lesion identified. 8 mm right hepatic calcified granuloma. Heterogenous, coarse echotexture with nodular contour. Indwelling tips is patent on color Doppler imaging. Other: None. IMPRESSION: Cirrhotic liver morphology.  No focal hepatic lesion. Cholelithiasis. Gallbladder wall prominence likely secondary to underlying liver inflammation. Patent TIPS. Electronically Signed   By: Primitivo Gauze M.D.   On: 01/21/2021 10:19    Labs:  CBC: Recent Labs    12/02/20 1943 12/03/20 1630 01/03/21 1354 02/01/21 1309  WBC 5.5 4.8 5.7 4.8  HGB 10.7* 10.6* 10.1* 9.8*  HCT 33.6* 33.2* 30.6* 30.3*  PLT 141* 120* 135* 108*    COAGS: No results for input(s): INR, APTT in the last 8760 hours.  BMP: Recent Labs    04/24/20 0506 05/24/20 1026 05/27/20 1438 06/08/20 1137 12/02/20 1943 12/03/20 1630 12/14/20 1511 01/03/21 1354 02/01/21 0935 02/01/21 1309  NA 140 137 137 139   < > 134* 130* 132* 135 130*  K 3.5 4.1 3.4* 3.9   < > 4.1 3.8 3.8 4.4 3.5  CL 112* 104 109 106   < > 103 97* 99 100 102  CO2 20* 15* 22 18*   < > 17* 20* 25 16* 19*  GLUCOSE 125* 176* 105* 156*   < > 118* 126* 127* 115* 269*  BUN _0 < > 22 21 25* 25 29*  CALCIUM 8.6* 8.6* 8.3* 9.0   < > 8.6* 8.3* 8.5* 8.7 8.3*  CREATININE 1.39* 1.71* 1.61* 1.75*    < > 1.79* 2.16* 2.13* 2.05* 2.21*  GFRNONAA 39* 30* 33* 29*   < > 31* 24* 25*  --  24*  GFRAA 45* 35* 38* 34*  --   --   --   --   --   --    < > = values in this interval not displayed.    LIVER FUNCTION TESTS: Recent Labs    04/22/20 1951  BILITOT 1.6*  AST 37  ALT 16  ALKPHOS 120  PROT 6.8  ALBUMIN 2.8*    TUMOR MARKERS: No results for input(s): AFPTM, CEA, CA199, CHROMGRNA in the last 8760 hours.  Assessment and Plan:  Crystal [name] is a 70 year old female SP treatment of emergent upper GI bleeding with TIPS, completed 08/07/18  She has complaint to me of increased itchy feeling, and she also has recent history of episodic leg swelling.   TIPS duplex shows a patent TIPS today. Recent ECHO 12/14/20 is essentially normal, such that it is unlikely that the TIPS flow is causing right heart failure.  The TIPS was originally performed for GI bleeding, with no prior or interval history of ascites.   I did discuss the swelling in the setting of TIPS with Crystal [name], and that occasionally the shunted flow from the TIPS can contribute to right heart overload, that seems not to be the case given the normal ECHO within 6 weeks.    Also, she has no history of ascites, and that if the TIPS were closing, she would more likely be at risk for recurrent GI bleeding.  Her most recent scope from last summer made no mention of portal gastropathy, so seems at that time was normal.  This potentially could have changed if the TIPS is narrowed, however, the duplex today does not suggest that.  The only way to know for certain would be a TIPS study with pressure measurement.   Occasionally, TIPS can cause liver dysfunction/failure.  I see that the  last CMP was last year, so we are ordering CMP today to evaluate the liver labs.    I did let Crystal [name] know that I would be following up with Dr. Marius Ditch to discuss.  For now, our plan will be a scheduled follow up in 6 months.  If we decide there is any yield to further  diagnostics at this time, including TIPS shunt study, we will reach out to her.   Plan: - Tentative plan for 6 month clinic follow up with TIPS duplex - CMP - If we decide there is any need for further diagnostics now, we will reach out.   Electronically Signed: Corrie Mckusick 02/09/2021, 1:28 PM   I spent a total of    15 Minutes in face to face in clinical consultation, greater than 50% of which was counseling/coordinating care for SP TIPS, with new leg swelling

## 2021-02-09 NOTE — Progress Notes (Signed)
[facility]  [name], [address] [place], [place] [address] Phone: [phone]  Fax: [phone]   Telephone Office Visit:  02/09/2021  Referring physician: Martin Majestic, *  I connected with [place] on 02/11/2021 at 10:36 AM by telephone and verified that I was speaking with the correct person using 2 identifiers.  The patient was at home.  I discussed the limitations, risk, security and privacy concerns of performing an evaluation and management service by videoconferencing and the availability of in person appointments.  I also discussed with the patient that there may be a patient responsible charge related to this service.  The patient expressed understanding and agreed to proceed.   Chief Complaint: Crystal [name] is a 70 y.o. female with decompensated cirrhosis s/p TIPS procedure, right sided pulmonary nodules who is seen for review of interval chest CT and discussion regarding direction of therapy.  HPI: The patient was last seen in the medical oncology clinic on 02/01/2021. At that time, she reported weight gain (8 pounds) due to fluid. Hematocrit was 30.3, hemoglobin 9.8, MCV 80.2, platelets 108,000, WBC 4,800. Sodium was 130 and potassium 3.5. Creatinine was 2.21 (CrCl 24 ml/min). Ferritin was 92. She received 80 mg IV Lasix and a vitamin B12 injection.  Chest CT without contrast on 02/01/2021 revealed increasing size of a nodule along the minor fissure in the RIGHT chest with further fissural thickening extending from this area on sagittal and coronal images in particular was craniocaudal in sagittal plane at 1.6 cm (compared to 1.8 cm). Overall findings were of uncertain significance. Given increased fissural thickening short interval follow-up or even PET evaluation could potentially be of benefit. Correlation with previous biopsy results was suggested to determine whether further sampling may be warranted. Morphologic features could be seen in  primary pulmonary neoplasm. There was a spiculated nodule in the RIGHT lower lobe measuring 2.4 x 1.6 cm previously 2.5 x 1.9 cm. There were findings of liver disease, tips and splenomegaly. There was three-vessel coronary artery disease. There was a patulous fluid-filled esophagus. The central pulmonary artery measured 3.5 cm greatest dimension, can be seen in the setting of pulmonary arterial hypertension. There was aortic atherosclerosis.  Ultrasound abdominal pelvic arterial/venous flow doppler on 02/09/2021 revealed patent TIPS, with the flow velocities essentially unchanged from the comparison duplex studies. Remainder of the exam was somewhat limited for complete evaluation of the hepatic vasculature given the reduced sonographic window.  During the interim, she has done fairly well.  She notes that her weight did not go down after she received IV Lasix.  She remains on Lasix twice daily.  She weighs herself at home.  She denies any bleeding.  She denies any increased shortness of breath or chest pain.  She is interested in pursuing additional imaging to assess the nodules noted on her chest CT. If pulmonary lesions potentially represent lung cancer, she is interested in SBRT.   Past Medical History:  Diagnosis Date  . Acute GI bleeding 02/09/2018  . Acute upper gastrointestinal bleeding 08/03/2018  . Anemia    TAKES IRON TAB  . Arthritis    SHOULDER  . B12 deficiency 02/18/2018  . Bleeding    GI  3/19  . Bronchitis    HX OF  . CHF (congestive heart failure) ([place])   . Cirrhosis ([place])   . Colon cancer screening   . Diabetes mellitus without complication ([place])    TYPE 2  . Fatty (change of) liver, not  elsewhere classified   . Full dentures    UPPER AND LOWER  . Iron deficiency anemia due to chronic blood loss 02/18/2018  . Lung nodule, multiple   . Pernicious anemia 02/22/2018  . PUD (peptic ulcer disease)   . Raynaud's syndrome   . Upper GI bleeding 08/03/2018    Past Surgical  History:  Procedure Laterality Date  . CATARACT EXTRACTION W/PHACO Right 02/06/2018   Procedure: CATARACT EXTRACTION PHACO AND INTRAOCULAR LENS PLACEMENT ([place]) RIGHT DIABETIC;  Surgeon: Leandrew Koyanagi, MD;  Location: [place];  Service: Ophthalmology;  Laterality: Right;  DIABETIC, ORAL MED  . CATARACT EXTRACTION W/PHACO Left 04/30/2018   Procedure: CATARACT EXTRACTION PHACO AND INTRAOCULAR LENS PLACEMENT (I[facility]);  Surgeon: Leandrew Koyanagi, MD;  Location: [facility];  Service: Ophthalmology;  Laterality: Left;  [place] 01:04 AP% 21.3 CDE 13.66 Fluid pack lot # [id number]  . COLONOSCOPY WITH PROPOFOL N/A 03/25/2018   Procedure: COLONOSCOPY WITH PROPOFOL;  Surgeon: Lin Landsman, MD;  Location[facility] ENDOSCOPY;  Service: Gastroenterology;  Laterality: N/A;  . DENTAL SURGERY     EXTRACTIONS  . ELECTROMAGNETIC NAVIGATION BROCHOSCOPY Right 01/27/2019   Procedure: ELECTROMAGNETIC NAVIGATION BRONCHOSCOPY;  Surgeon: [name], MD;  Location: [facility];  Service: Cardiopulmonary;  Laterality: Right;  . ENTEROSCOPY N/A 07/28/2020   Procedure: Push ENTEROSCOPY;  Surgeon: Lin Landsman, MD;  Location: [facility] ENDOSCOPY;  Service: Gastroenterology;  Laterality: N/A;  . ESOPHAGOGASTRODUODENOSCOPY N/A 08/03/2018   Procedure: ESOPHAGOGASTRODUODENOSCOPY (EGD);  Surgeon: Lin Landsman, MD;  Location: [facility] ENDOSCOPY;  Service: Gastroenterology;  Laterality: N/A;  . ESOPHAGOGASTRODUODENOSCOPY (EGD) WITH PROPOFOL N/A 02/10/2018   Procedure: ESOPHAGOGASTRODUODENOSCOPY (EGD) WITH PROPOFOL;  Surgeon: Jonathon Bellows, MD;  Location: [facility] ENDOSCOPY;  Service: Gastroenterology;  Laterality: N/A;  . ESOPHAGOGASTRODUODENOSCOPY (EGD) WITH PROPOFOL N/A 03/25/2018   Procedure: ESOPHAGOGASTRODUODENOSCOPY (EGD) WITH PROPOFOL;  Surgeon: Lin Landsman, MD;  Location: [facility] ENDOSCOPY;  Service: Gastroenterology;  Laterality: N/A;  . ESOPHAGOGASTRODUODENOSCOPY (EGD) WITH PROPOFOL N/A 04/22/2018   Procedure:  ESOPHAGOGASTRODUODENOSCOPY (EGD) WITH PROPOFOL with band ligation;  Surgeon: Lin Landsman, MD;  Location: [place];  Service: Gastroenterology;  Laterality: N/A;  . ESOPHAGOGASTRODUODENOSCOPY (EGD) WITH PROPOFOL N/A 06/24/2018   Procedure: ESOPHAGOGASTRODUODENOSCOPY (EGD) WITH PROPOFOL;  Surgeon: Lin Landsman, MD;  Location: [facility] ENDOSCOPY;  Service: Gastroenterology;  Laterality: N/A;  . ESOPHAGOGASTRODUODENOSCOPY (EGD) WITH PROPOFOL N/A 11/19/2018   Procedure: ESOPHAGOGASTRODUODENOSCOPY (EGD) WITH PROPOFOL;  Surgeon: Lin Landsman, MD;  Location: [facility] ENDOSCOPY;  Service: Gastroenterology;  Laterality: N/A;  . ESOPHAGOGASTRODUODENOSCOPY (EGD) WITH PROPOFOL N/A 03/08/2020   Procedure: ESOPHAGOGASTRODUODENOSCOPY (EGD) WITH PROPOFOL;  Surgeon: Lin Landsman, MD;  Location: [facility] ENDOSCOPY;  Service: Gastroenterology;  Laterality: N/A;  . EYE SURGERY    . GIVENS CAPSULE STUDY N/A 03/29/2020   Procedure: GIVENS CAPSULE STUDY;  Surgeon: Lin Landsman, MD;  Location: [facility] ENDOSCOPY;  Service: Gastroenterology;  Laterality: N/A;  . HEMORRHOID BANDING  03/25/2018   Procedure: HEMORRHOID BANDING;  Surgeon: Lin Landsman, MD;  Location: [facility] ENDOSCOPY;  Service: Gastroenterology;;  . IR EMBO ART  VEN HEMORR LYMPH EXTRAV  INC GUIDE ROADMAPPING  08/07/2018  . IR RADIOLOGIST EVAL & MGMT  09/03/2018  . IR RADIOLOGIST EVAL & MGMT  12/17/2018  . IR RADIOLOGIST EVAL & MGMT  06/24/2019  . IR RADIOLOGIST EVAL & MGMT  01/22/2020  . IR RADIOLOGIST EVAL & MGMT  02/09/2021  . IR TIPS  08/07/2018  . PORTA CATH INSERTION N/A 07/30/2020   Procedure: PORTA CATH INSERTION;  Surgeon: Katha Cabal,  MD;  Location: [place] [facility];  Service: Cardiovascular;  Laterality: N/A;  . RADIOLOGY WITH ANESTHESIA N/A 08/07/2018   Procedure: TIPS;  Surgeon: Corrie Mckusick, DO;  Location: [place];  Service: Anesthesiology;  Laterality: N/A;  . TONSILLECTOMY      Family History  Problem Relation  Age of Onset  . CAD Father   . Heart attack Father 35  . Cancer Maternal Uncle   . Seizures Mother     Social History:  reports that she has never smoked. She has never used smokeless tobacco. She reports previous alcohol use. She reports that she does not use drugs. She retired from [employer] in 11/2017. She lives in [place]. The patient is alone today.  Allergies: No Known Allergies  Current Medications: Current Outpatient Medications  Medication Sig Dispense Refill  . cyanocobalamin (,VITAMIN B-12,) 1000 MCG/ML injection Inject 1,000 mcg into the muscle every 30 (thirty) days.     . furosemide (LASIX) 80 MG tablet Take 85m (one tablet) in the morning daily with an additional 851mtablet as needed for weight gain of 2 pounds overnight or 5 pounds in week. 180 tablet 1  . hydrOXYzine (ATARAX/VISTARIL) 25 MG tablet Take 25 mg by mouth 3 (three) times daily.    . Marland KitchenLOR-CON M10 10 MEQ tablet TAKE 10 MG (1 TABLET DAILY) DAILY . ALTERNATING DAYS TAKE 15 MG (1 AND 1/2 TABLET) DAILY 38 tablet 3  . lactulose (CHRONULAC) 10 GM/15ML solution SMARTSIG:Milliliter(s) By Mouth    . Misc. Devices KIT Moderate compression hose 20-30 MM HG 1 kit 0  . nystatin (MYCOSTATIN/NYSTOP) powder Apply 1 g topically daily as needed (rash).    . ondansetron (ZOFRAN) 4 MG tablet Take 4 mg by mouth every 8 (eight) hours as needed for nausea or vomiting.     . Glory Rosebushelica Lancets 3300XISC 2 (two) times daily.    . Glory RosebushLTRA test strip 2 (two) times daily.    . pantoprazole (PROTONIX) 40 MG tablet Take 40 mg by mouth 2 (two) times daily.    . potassium chloride (KLOR-CON) 10 MEQ tablet Take 10 mEq by mouth daily.    . Marland Kitchenpironolactone (ALDACTONE) 100 MG tablet TAKE 1 TABLET BY MOUTH TWICE A DAY 60 tablet 2  . Vitamin D, Ergocalciferol, (DRISDOL) 1.25 MG (50000 UNIT) CAPS capsule Take 50,000 Units by mouth once a week.    . Marland KitchenIFAXAN 550 MG TABS tablet Take 550 mg by mouth 2 (two) times daily.     No current  facility-administered medications for this visit.   Facility-Administered Medications Ordered in Other Visits  Medication Dose Route Frequency Provider Last Rate Last Admin  . sodium chloride flush (NS) 0.9 % injection 10 mL  10 mL Intravenous PRN CoNolon Stalls, MD   10 mL at 09/14/20 1320    Review of Systems  Constitutional: Positive for weight loss (no weight loss after Lasix). Negative for chills and fever.  HENT: Negative.  Negative for congestion, ear pain, nosebleeds, sinus pain and sore throat.   Eyes: Negative for blurred vision, double vision, pain and discharge.  Respiratory: Positive for shortness of breath (with exertion). Negative for cough and hemoptysis.   Cardiovascular: Negative for chest pain, palpitations, orthopnea and leg swelling.  Gastrointestinal: Negative.  Negative for abdominal pain, blood in stool, constipation, diarrhea, heartburn, melena, nausea and vomiting.       Abdominal swelling.  Genitourinary: Negative.  Negative for dysuria, frequency, hematuria and urgency.  No increased urine output after IV Lasix.  Musculoskeletal: Negative.  Negative for back pain, falls and myalgias.       Chronic leg swelling.  Skin: Negative.  Negative for rash.  Neurological: Negative for dizziness, sensory change, speech change, focal weakness, weakness and headaches.  Endo/Heme/Allergies: Negative.  Does not bruise/bleed easily.  Psychiatric/Behavioral: Negative.  Negative for depression and memory loss. The patient is not nervous/anxious and does not have insomnia.    Performance status (ECOG): 1  Vital Signs There were no vitals taken for this visit.   Imaging studies: 02/09/2018:  Abdomen and pelvic CTrevealed an 1.8 cm spiculated density in right lower lobe concerning for malignancy. There was mildly nodular hepatic contourconcerning for hepatic cirrhosis. There was moderate splenomegalysuggesting portal venous hypertension. Mild ascites was noted.  There were mildly enlarged retroperitoneal lymph nodes (largest 1.1 cm) concerning for possible metastatic disease or malignancy. There was a 7 cm left ovarian cyst. Further evaluation with MRI was recommended to evaluate for possible neoplasm.  05/29/2018:  RUQ ultrasoundrevealed cholelithiasis. There was a thickened edematous gallbladder wall. The wall thickening could be due to hypoproteinemia/hypoalbuminemia or cholecystitis. Negative sonographic Murphy's sign. There was a heterogeneous slightly nodular liver consistent with cirrhosis.  04/24/2019:  Abdominal/pelvic arterial/venous ultrasound doppler revealed a patent TIPS and no evidence of persisting ascites.  11/12/2019:  Abdominal MRI including MRCPrevealed cirrhosis with TIPS in place.There was no biliary duct dilatation.There was cholelithiasis without acute cholecystitis. Right hepatic lobe 9 mm lesion whichwas indeterminate, LR 3.This was possibly present on the prior CT of 2019. Recommendation includedrepeat pre and post contrast abdominal MRI at 3-6 months. There as small volume abdominal ascites.The right lower lobe pulmonary nodulewas suboptimally evaluated. 02/09/2021:  Ultrasound abdominal pelvic arterial/venous flow doppler revealed patent TIPS, with the flow velocities essentially unchanged from the comparison duplex studies. Remainder of the exam was somewhat limited for complete evaluation of the hepatic vasculature given the reduced sonographic window.   02/25/2018:  Chest CTrevealed a 2.3 x 1.5 x 1.0 cm perifissural nodulewith spiculated margins in the right middle lobe. There was a 1.5 x 1.5 x 1.4 cm right lower lobe spiculated nodule. There was a moderate left sided pleural effusion.  03/08/2018:  PET scan revealed a 2.5 x 1.8 cm sub solid nodular opacity in theRML(SUV 2.7) and a 1.6 cm roundedslightly spiculated RLL pulmonary nodule(SUV 2.1). There were no enlarged or hypermetabolic mediastinal or hilar  lymph nodes. There was a small left pleural effusion. 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 05/15/2018:  Chest CT revealed no significant changes in the 2 previously noted nodules in the RIGHT lung. Irregular RML lesion measures 2.4 x 1.4 cm (previously 2.3 x 1.5 cm). Spiculated central RLL lesion measures 1.8 x 1.4 cm (previously 1.6 x 1.5 cm). Both areas remain concerning for neoplasm. There was interval decrease in ascites volume overall. Upper abdominal lymphadenopathy noted that was felt to be reactive due to underlying liver disease.  08/15/2018:  Chest CTrevealed the spiculated right lower lobe lung lesion appeared slightly increased and was not significantly changed in size compared with previous exam. Morphologically, this was worrisome for a small bronchogenic neoplasm. The right middle lobe lung nodule was slightly decreased in size in the interval and may be post infectious or inflammatory in etiology. 01/24/2019:  Super D chest CTrevealed a 3.2 x 2.3 cm ill-defined anterior right lung nodule compared to 2.9 x 2.3 cm previously. Minor fissurewasnot well, and as such,  this nodule can not be confidently localized to the right upper or middle lobe. The right lower lobe nodule measured2.0 x 2.0 cm comparedto 2.0 x 1.9 cm. 08/07/2019:  Chest CTrevealed no significant interval change in size right middle lobe and right lower lobe lung nodules.Liverwascompatible with cirrhosiss/pTIPS. 02/02/2020:  Chest CTrevealed stable nodules within the right middle and right lower lobes.Therewerecirrhotic changesof the liver with TIPS shunt in place. There was mild prominence of the ascending aorta to 4 cm. Recommendedannual imaging followup by CTA or MRA. 02/01/2021:  Chest CT revealed increasing size of a nodule along the minor fissure in the RIGHT chest with further fissural thickening extending  from this area on sagittal and coronal images in particular was craniocaudal in sagittal plane at 1.6 cm (compared to 1.8 cm). Overall findings were of uncertain significance. Given increased fissural thickening short interval follow-up or even PET evaluation could potentially be of benefit. There was a spiculated nodule in the RIGHT lower lobe measuring 2.4 x 1.6 cm previously 2.5 x 1.9 cm. There were findings of liver disease, TIPS and splenomegaly.    03/18/2018:  Pelvic ultrasounddemonstrated a simple cyst in the LEFT ovarymeasuring 6.1 x 4.5 x 6.6 cm. Repeat imaging recommended in 1 year to document stability.  Patient declined follow-up. 06/15/2020:  Renal ultrasound revealed marked splenomegaly (13.9 x 15.2 x 5.6 cm; 618 ml) similar to comparison study of 05/28/2020 and MR of the abdomen on 11/12/2019.   No visits with results within 3 Day(s) from this visit.  Latest known visit with results is:  Appointment on 02/01/2021  Component Date Value Ref Range Status  . Ferritin 02/01/2021 92  11 - 307 ng/mL Final   Performed at [facility], [address] [place], [place] [address]  . WBC 02/01/2021 4.8  4.0 - 10.5 K/uL Final  . RBC 02/01/2021 3.78* 3.87 - 5.11 MIL/uL Final  . Hemoglobin 02/01/2021 9.8* 12.0 - 15.0 g/dL Final  . HCT 02/01/2021 30.3* 36.0 - 46.0 % Final  . MCV 02/01/2021 80.2  80.0 - 100.0 fL Final  . MCH 02/01/2021 25.9* 26.0 - 34.0 pg Final  . MCHC 02/01/2021 32.3  30.0 - 36.0 g/dL Final  . RDW 02/01/2021 16.8* 11.5 - 15.5 % Final  . Platelets 02/01/2021 108* 150 - 400 K/uL Final   Comment: Immature Platelet Fraction may be clinically indicated, consider ordering this additional test [id number]   . nRBC 02/01/2021 0.0  0.0 - 0.2 % Final   Performed at [facility], [address], [place], [place] [address]  . Sodium 02/01/2021 130* 135 - 145 mmol/L Final  . Potassium 02/01/2021 3.5  3.5 - 5.1 mmol/L Final  . Chloride 02/01/2021 102  98 -  111 mmol/L Final  . CO2 02/01/2021 19* 22 - 32 mmol/L Final  . Glucose, Bld 02/01/2021 269* 70 - 99 mg/dL Final   Glucose reference range applies only to samples taken after fasting for at least 8 hours.  . BUN 02/01/2021 29* 8 - 23 mg/dL Final  . Creatinine, Ser 02/01/2021 2.21* 0.44 - 1.00 mg/dL Final  . Calcium 02/01/2021 8.3* 8.9 - 10.3 mg/dL Final  . GFR, Estimated 02/01/2021 24* >60 mL/min Final   Comment: (NOTE) Calculated using the CKD-EPI Creatinine Equation (2021)   . Anion gap 02/01/2021 9  5 - 15 Final   Performed at [facility], [address], [place],  [address]    Assessment:  Crystal [name] is a 70 y.o. female  with a right lower lobe pulmonary nodule. She denies any smoking history.  Abdomen and pelvic CTon 02/09/2018 revealed an 1.8 cm spiculated density in right lower lobe concerning for malignancy. There was mildly nodular hepatic contourconcerning for hepatic cirrhosis. There was moderate splenomegalysuggesting portal venous hypertension. Mild ascites was noted. There were mildly enlarged retroperitoneal lymph nodes (largest 1.1 cm) concerning for possible metastatic disease or malignancy. There was a 7 cm left ovarian cyst. Further evaluation with MRI was recommended to evaluate for possible neoplasm. CA125was 351.3 and CEA1.1 on 02/21/2018.   Chest CT on 02/01/2021 revealed increasing size of a nodule along the minor fissure in the RIGHT chest with further fissural thickening extending from this area on sagittal and coronal images in particular was craniocaudal in sagittal plane at 1.6 cm (compared to 1.8 cm). Overall findings were of uncertain significance. Given increased fissural thickening short interval follow-up or even PET evaluation could potentially be of benefit. There was a spiculated nodule in the RIGHT lower lobe measuring 2.4 x 1.6 cm previously 2.5 x 1.9 cm. There were findings of liver disease, TIPS and splenomegaly.   She  was admitted to [facility] from 02/09/2018 - 04/02/2019with a variceal hemorrhage. EGDon 02/10/2018 revealed 5 columns of grade III esophageal varices in the lower third of the esophagus. There was stigmata of recent bleeding and red wale signs. She underwent variceal ligation x 10. She required 3 units of PRBCs.  EGDon 03/25/2018 revealed 2 angiectasias(non-bleeding) in the second portion of the duodenum, and a few diminutive (non-bleeding) angiectasias in the prepyloric region of the stomach that were treated APC. There were 4 non-bleeding ulcers ([place] Class III) found in the gastric fundus. There were 4 columns of large non-bleeding varicesin the lower third of the esophagus, of which demonstrated no stigmata of bleeding. Varices were banded. EGD on 04/22/2018 revealed one non-bleeding cratered gastric ulcer([place] Class III) in the lesser curvature of the gastric body. Duodenal bulb and second portion of the duodenum were normal. Moderate portal hypertensive gastropathy noted in the stomach. Large varices in the lower third of the esophagus noted. 2 bands were placed with incomplete eradication of the lesions. EGD on 03/08/2020 revealed a few non-bleeding angioectasias in the duodenum and a gastric antral vascular ectasia with bleeding treated with argon plasma coagulation (APC).  There was a single non-bleeding angioectasia in the stomach treated with bipolar cautery. Gastroesophageal junction and esophagus were normal. No specimens were collected.  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  Colonoscopyon 03/25/2018 revealed a single 5 mm sessile polyp in the ascending colon. Patient has rectal varices and external hemorrhoids. Pathology returned as tubular  adenoma, and was negative for high grade dysplasia and malignancy.   She has iron deficiency anemia. Ferritinwas 6. She is on ferrous sulfate 325 mg TID. She hasB12 deficiency. B12was 103. Intrinsic factor antibody was negative. 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 She began B12 injections on 03/06/2018 (last 02/01/2021). Folate was19.2on06/06/2020. Dietis good.  She received Venoferweekly x 4 (07/12/2018 - 07/31/2018), x 3 (12/22/2019 -01/05/2020), x 3 (03/30/2020 - 04/13/2020), 07/08/2020, 07/21/2020, x 3 (08/05/2020 - 08/19/2020), 01/04/2021, and 01/12/2021.  Ferritinhas been followed: 6 on 02/09/2018, 16 on 05/24/2018, 13 on 07/10/2018, 374 on 08/15/2018, 485 on 08/21/2018, 114 on 09/25/2018, 63 on 11/20/2018, 35 on 02/12/2019, 22 on 04/24/2019, 41  on 09/22/2019, 41 on 10/27/2019, 21 on 12/15/2019, 22 on 03/22/2020, 20 on 03/26/2020, 271 on 04/14/2020, 57 on 05/24/2020, 23 on 06/30/2020, 26 on 08/05/2020, 74 on 09/14/2020, 42 on 11/09/2020, 28 on 01/03/2021, and 92 on 02/01/2021.  She has hepatitis C antibodies. HCV RNA was not detected. She has been exposed to hepatitis B. Anti-smooth muscle antibodiesare weakly positive. ANAwas positive (>1:1280 centromere antibody). Negative studiesincluded: ceruloplasmin, anti-mitochondrial antibodies, hepatitis B IgM, HIV, and alpha-1 antitrypsin. PT was 15.2 (INR 1.21). AFPwas 1.5 on 05/28/2018,2.4 on 11/20/2018, 2.0 on 04/24/2019, and 1.8 on 09/14/2020.  She has a history of decompensated cirrhosiss/p TIPSprocedure 08/07/2018. She has portal hypertension and a history of recurrent bleeding esophageal varices.She was given a 2-year life expectancyfollowing her TIPS procedure. Abdominal/pelvic arterial/venous ultrasound doppleron 02/09/2021 revealed patent TIPS, with the flow velocities essentially unchanged from the comparison duplex studies. Remainder of the exam was somewhat  limited for complete evaluation of the hepatic vasculature given the reduced sonographic window.  AFP was 1.8 on 09/14/2020.  [facility] with[facility].HeadCT revealedno acute intracranial pathology. Head MRIwo contrast showed symmetric FLAIR hyperintensity within the bilateral mamillary bodies. This finding may be seen in the setting of Wernicke encephalopathy.There was no evidence of acute infarct and mild chronic small vessels with ischemic disease.Her mental status returned to baseline.  The patient received the [place] COVID-19 vaccine on 12/26/2019 and 01/21/2020.  Symptomatically, she has chronic issues with fluid retention.  She denies any increased shortness of breath.  Plan: 1.   Review labs from 02/01/2021 and 02/09/2021. 2.   Decompensated cirrhosis s/p TIPS Clinically,she is doing fair. Life expectancy is limited s/pTIPS on 08/07/2018 (2 years). AFPwas 1.8 on 09/14/2020. Continue [place] surveillance every 6 months with AFP and liver imaging (last 05/28/2020).   Review abdominal/pelvic arterial/venous ultrasound doppler from 02/09/2021. 3.Pulmonary nodules Chest CTon 02/02/2020 revealed stable nodules within the right middle and right lower lobes.  Chest CT on 02/01/2021 was personally reviewed.  Agree with radiology findings.   Increase nodule along the minor fissure in the right chest (elongated).    RLL nodule measuring 2.4 x 1.6 cm.   Discuss patient's thoughts about follow-up and potential treatment if needed.   Patient interested in pursuing a PET scan. 4.Iron deficiency anemia Hematocrit 30.3. Hemoglobin 9.8.  MCV 80.2 on 02/01/2021.  Ferritin 92. Continue to monitor. 5.B12 deficiency Patient receives B12 monthly (last03/22/2022) Continue B12 monthly. 6.   PET scan on 02/28/2021. 7.   RTC after PET scan (telephone or video) for MD assessment and discussion  regarding direction of therapy.  I discussed the assessment and treatment plan with the patient.  The patient was provided an opportunity to ask questions and all were answered.  The patient agreed with the plan and demonstrated an understanding of the instructions.  The patient was advised to call back if the symptoms worsen or if the condition fails to improve as anticipated.  I provided 13 minutes (10:36 AM - 10:48 AM) of non face-to-face telephone time during this this encounter and > 50% was spent counseling as documented under my assessment and plan.  I provided these services from home.   Lequita Asal, MD, PhD    02/11/2021, 4:22 PM

## 2021-02-10 DIAGNOSIS — N184 Chronic kidney disease, stage 4 (severe): Secondary | ICD-10-CM | POA: Diagnosis not present

## 2021-02-11 ENCOUNTER — Telehealth: Payer: Self-pay | Admitting: Family

## 2021-02-11 ENCOUNTER — Other Ambulatory Visit: Payer: Self-pay

## 2021-02-11 ENCOUNTER — Inpatient Hospital Stay: Payer: Medicare HMO | Attending: Hematology and Oncology | Admitting: Hematology and Oncology

## 2021-02-11 ENCOUNTER — Encounter: Payer: Self-pay | Admitting: Hematology and Oncology

## 2021-02-11 DIAGNOSIS — R918 Other nonspecific abnormal finding of lung field: Secondary | ICD-10-CM | POA: Diagnosis not present

## 2021-02-11 DIAGNOSIS — D509 Iron deficiency anemia, unspecified: Secondary | ICD-10-CM | POA: Insufficient documentation

## 2021-02-11 DIAGNOSIS — D500 Iron deficiency anemia secondary to blood loss (chronic): Secondary | ICD-10-CM

## 2021-02-11 DIAGNOSIS — E538 Deficiency of other specified B group vitamins: Secondary | ICD-10-CM | POA: Insufficient documentation

## 2021-02-11 NOTE — Telephone Encounter (Signed)
Spoke to pt. States her wt has increased: Last 3 days wt has been 200lbs. 3/29 197lbs.  Reports she has been weighing daily, but theses are only numbers given. Reports baseline weight as 180lbs.  Reports incr in dyspnea on exertion. And incr swelling in BLE up to her abdomen. States skin is tight but still dry, and difficulty to lift her legs d/t swelling.  Per chart pt had IV Lasix 2 weeks prior.  Pt does continue to take Lasix 61m PO BID. Has not missed any doses.  Reports she has "tried" to keep fluid <2L/day and has "mostly" been limiting salt intake.   Notified pt I will forward to provider and call back with recc.

## 2021-02-11 NOTE — Telephone Encounter (Signed)
Spoke to pt, notified of provider's recc below.  Pt verbalized understanding. She will: -Continue Lasix 44m PO BID -Follow strict instructions given of <2L/day, low salt, and elevating legs  Appt scheduled for Monday 4/4 @ 1030 with CLaurann Montana NP.

## 2021-02-11 NOTE — Telephone Encounter (Signed)
Please schedule for office visit next week with Dr. Saunders Revel or APP. Please schedule for a morning appointment in case we need to send to short stay for IV Lasix. Continue Lasix 6m PO BID until visit. She needs to be very strict about restricting to <2L fluid, low salt diet, elevating her legs.   Will CC Dr. ESaunders Revelfor any additional input. Her volume status is difficult to manage in the setting of cirrhosis and CKD. Recent lab work available below for his review.  (Dr. ESaunders Revel of note CT with changing lung nodule which she had an appt with Dr. CMike Giptoday but note not yet available).   01/25/21 K 3.6, creatinine 2.06, GFR 24 02/01/21 creatinine 2.21, GFR 24 [received 871mIV lasix at cancer center per my order] 02/09/21 K 4.7, creatinine 2.2, GFR 22  CaLoel DubonnetNP

## 2021-02-11 NOTE — Telephone Encounter (Signed)
Pt c/o swelling: STAT is pt has developed SOB within 24 hours  1) How much weight have you gained and in what time span? Now at 200, states at last visit she was 180  2) If swelling, where is the swelling located? Ankles to belly  3) Are you currently taking a fluid pill?   4) Are you currently SOB? furosemide   5) Do you have a log of your daily weights (if so, list)?  no  6) Have you gained 3 pounds in a day or 5 pounds in a week? Yes 6 lbs  7) Have you traveled recently? No    Patient had recent phone visit and was told if she needed another round of lasix they would be able to oblige in [place] if needed

## 2021-02-14 ENCOUNTER — Encounter: Payer: Self-pay | Admitting: Family

## 2021-02-14 ENCOUNTER — Other Ambulatory Visit: Payer: Self-pay

## 2021-02-14 ENCOUNTER — Telehealth: Payer: Self-pay | Admitting: Hematology and Oncology

## 2021-02-14 ENCOUNTER — Ambulatory Visit (INDEPENDENT_AMBULATORY_CARE_PROVIDER_SITE_OTHER): Payer: Medicare HMO | Admitting: Family

## 2021-02-14 VITALS — BP 124/60 | HR 60 | Ht 62.0 in | Wt 199.2 lb

## 2021-02-14 DIAGNOSIS — I5032 Chronic diastolic (congestive) heart failure: Secondary | ICD-10-CM | POA: Diagnosis not present

## 2021-02-14 DIAGNOSIS — K7460 Unspecified cirrhosis of liver: Secondary | ICD-10-CM

## 2021-02-14 DIAGNOSIS — I2584 Coronary atherosclerosis due to calcified coronary lesion: Secondary | ICD-10-CM | POA: Diagnosis not present

## 2021-02-14 DIAGNOSIS — I2510 Atherosclerotic heart disease of native coronary artery without angina pectoris: Secondary | ICD-10-CM | POA: Diagnosis not present

## 2021-02-14 MED ORDER — TORSEMIDE 20 MG PO TABS: 40.0000 mg | ORAL_TABLET | Freq: Two times a day (BID) | ORAL | 2 refills | Status: DC

## 2021-02-14 NOTE — Progress Notes (Signed)
Office Visit    Patient Name: Crystal [name] Date of Encounter: 02/14/2021  Primary Care Provider:  Martin Majestic, FNP Primary Cardiologist:  Nelva Bush, MD Electrophysiologist:  None   Chief Complaint    Crystal [name] is a 70 y.o. 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 Acute GI bleeding 02/09/2018  . Acute upper gastrointestinal bleeding 08/03/2018  . Anemia    TAKES IRON TAB  . Arthritis    SHOULDER  . B12 deficiency 02/18/2018  . Bleeding    GI  3/19  . Bronchitis    HX OF  . CHF (congestive heart failure) ([place])   . Cirrhosis ([place])   . Colon cancer screening   . Diabetes mellitus without complication ([place])    TYPE 2  . Fatty (change of) liver, not elsewhere classified   . Full dentures    UPPER AND LOWER  . Iron deficiency anemia due to chronic blood loss 02/18/2018  . Lung nodule, multiple   . Pernicious anemia 02/22/2018  . PUD (peptic ulcer disease)   . Raynaud's syndrome   . Upper GI bleeding 08/03/2018   Past Surgical History:  Procedure Laterality Date  . CATARACT EXTRACTION W/PHACO Right 02/06/2018   Procedure: CATARACT EXTRACTION PHACO AND INTRAOCULAR LENS PLACEMENT ([place]) RIGHT DIABETIC;  Surgeon: Leandrew Koyanagi, MD;  Location: [place];  Service: Ophthalmology;  Laterality: Right;  DIABETIC, ORAL MED  . CATARACT EXTRACTION W/PHACO Left 04/30/2018   Procedure: CATARACT EXTRACTION PHACO AND INTRAOCULAR LENS PLACEMENT (I[facility]);  Surgeon: Leandrew Koyanagi, MD;  Location: [facility];  Service: Ophthalmology;  Laterality: Left;  [place] 01:04 AP% 21.3 CDE 13.66 Fluid pack lot # [id number]  . COLONOSCOPY WITH PROPOFOL N/A 03/25/2018   Procedure: COLONOSCOPY WITH PROPOFOL;  Surgeon: Lin Landsman, MD;   Location: [facility] ENDOSCOPY;  Service: Gastroenterology;  Laterality: N/A;  . DENTAL SURGERY     EXTRACTIONS  . ELECTROMAGNETIC NAVIGATION BROCHOSCOPY Right 01/27/2019   Procedure: ELECTROMAGNETIC NAVIGATION BRONCHOSCOPY;  Surgeon: [name], MD;  Location: [facility];  Service: Cardiopulmonary;  Laterality: Right;  . ENTEROSCOPY N/A 07/28/2020   Procedure: Push ENTEROSCOPY;  Surgeon: Lin Landsman, MD;  Location: [facility] END[facility]COPY;  Service: Gastroenterology;  Laterality: N/A;  . ESOPHAGOGASTRODUODENOSCOPY N/A 08/03/2018   Procedure: ESOPHAGOGASTRODUODENOSCOPY (EGD);  Surgeon: Lin Landsman, MD;  Location: [facility] END[facility]COPY;  Service: Gastroenterology;  Laterality: N/A;  . ESOPHAGOGASTRODUODENOSCOPY (EGD) WITH PROPOFOL N/A 02/10/2018   Procedure: ESOPHAGOGASTRODUODENOSCOPY (EGD) WITH PROPOFOL;  Surgeon: Jonathon Bellows, MD;  Location: [facility] ENDOSCOPY;  Service: Gastroenterology;  Laterality: N/A;  . ESOPHAGOGASTRODUODENOSCOPY (EGD) WITH PROPOFOL N/A 03/25/2018   Procedure: ESOPHAGOGASTRODUODENOSCOPY (EGD) WITH PROPOFOL;  Surgeon: Lin Landsman, MD;  Location: [facility] ENDOSCOPY;  Service: Gastroenterology;  Laterality: N/A;  . ESOPHAGOGASTRODUODENOSCOPY (EGD) WITH PROPOFOL N/A 04/22/2018   Procedure: ESOPHAGOGASTRODUODENOSCOPY (EGD) WITH PROPOFOL with band ligation;  Surgeon: Lin Landsman, MD;  Location: [place];  Service: Gastroenterology;  Laterality: N/A;  . ESOPHAGOGASTRODUODENOSCOPY (EGD) WITH PROPOFOL N/A 06/24/2018   Procedure: ESOPHAGOGASTRODUODENOSCOPY (EGD) WITH PROPOFOL;  Surgeon: Lin Landsman, MD;  Location: [facility] ENDOSCOPY;  Service: Gastroenterology;  Laterality: N/A;  . ESOPHAGOGASTRODUODENOSCOPY (EGD) WITH PROPOFOL N/A 11/19/2018   Procedure: ESOPHAGOGASTRODUODENOSCOPY (EGD) WITH PROPOFOL;  Surgeon: Lin Landsman, MD;  Location: [place];  Service:  Gastroenterology;  Laterality: N/A;  . ESOPHAGOGASTRODUODENOSCOPY (EGD) WITH PROPOFOL N/A 03/08/2020    Procedure: ESOPHAGOGASTRODUODENOSCOPY (EGD) WITH PROPOFOL;  Surgeon: Lin Landsman, MD;  Location: [facility] ENDOSCOPY;  Service: Gastroenterology;  Laterality: N/A;  . EYE SURGERY    . GIVENS CAPSULE STUDY N/A 03/29/2020   Procedure: GIVENS CAPSULE STUDY;  Surgeon: Lin Landsman, MD;  Location: [facility] ENDOSCOPY;  Service: Gastroenterology;  Laterality: N/A;  . HEMORRHOID BANDING  03/25/2018   Procedure: HEMORRHOID BANDING;  Surgeon: Lin Landsman, MD;  Location: [facility] ENDOSCOPY;  Service: Gastroenterology;;  . IR EMBO ART  VEN HEMORR LYMPH EXTRAV  INC GUIDE ROADMAPPING  08/07/2018  . IR RADIOLOGIST EVAL & MGMT  09/03/2018  . IR RADIOLOGIST EVAL & MGMT  12/17/2018  . IR RADIOLOGIST EVAL & MGMT  06/24/2019  . IR RADIOLOGIST EVAL & MGMT  01/22/2020  . IR RADIOLOGIST EVAL & MGMT  02/09/2021  . IR TIPS  08/07/2018  . PORTA CATH INSERTION N/A 07/30/2020   Procedure: PORTA CATH INSERTION;  Surgeon: Katha Cabal, MD;  Location: [place] CV LAB;  Service: Cardiovascular;  Laterality: N/A;  . RADIOLOGY WITH ANESTHESIA N/A 08/07/2018   Procedure: TIPS;  Surgeon: Corrie Mckusick, DO;  Location: [place];  Service: Anesthesiology;  Laterality: N/A;  . TONSILLECTOMY     Allergies No Known Allergies  History of Present Illness    Crystal [name] is a 70 y.o. 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  No symptoms suggestive of CAD.   Echo 04/04/2019 LVEF 55 to 60%, mild LVH, LV diastolic parameters consistent with impaired relaxation, RV normal size and function, no significant valvular abnormalities, mild dilation of aortic root 3.5 cm. Echo 05/27/20 with normal LVEF and grade 2 diastolic dysfunction, RV mildly enlarged, no significant  valvular abnormalities.   Volume status has been difficult to control with concomitants cirrhosis, hyponatremia, HFpEF, and CKD.  LE duplex 03/30/20 with no evidence of DVT bilaterally. Noted chronic DVT, calcified, at the junction of distal common femoral vein and origin of the femoral vein with diminished flow focally at this site. As it was chronic, not recommended for anticoagulation especially in setting of anemia.   Seen in urgent care 11/23/20 due to boil on right calf treated with antibiotics. She had continued swelling up into her abdomen and was seen in the ED 12/03/20 and given Furosemide 65m IVx1, discharged home. She saw her gastroenterologist 12/04/20 and Spironolactone increased to 1066mdaily. Seen in follow up by Dr. EnSaunders Revel/26/22 with no improvement in edema. Her Reds Vest was only mildly elevated at 39%. Concern was that bowel wall edema was leading to poor absorption of oral diuretics. Dr. EnSaunders Revelrranged for her to received furosemide 8063mV x1 when port was accessed at the cancer center 12/09/20. She was also recommended for repeat echocardiogram. Echo 12/14/20 LVEF 55-60% with no wall motion abnormalities, LV moderately dilated, gr1DD, RV normal size and function, bilateral atria mildly dilated, mild MR, trivial AI.  Follow up 12/16/20 weight down 2 pounds and edema improving. Fluid restriction encouraged. She was taking Lasix 54m67mD, Spironolactone 100mg43m. She was recommended to reduce Lasix to 54mg 70mith additional afternoon dose PRN for weight gain of 2 pounds overnight or 5 pounds in 1 week.  Seen in follow up 01/13/21 with stable weight and dyspnea on exertion. Edema continued to improve. Lasix 39m QD with additional 842mPRN for weight gain of 2 lbs overnight or 5 lbs in 1 week. We were contacted 3/17 due to weight gain of 11lbs and edema, 8018mV lasix was given at her cancer center appt 02/01/21.  She had CT 02/01/21 revealed increasing size of nodule along minor fissure in the  right chest with further fissural thickening. Follow up PET was recommended.   She contact our office 02/11/21 noting weight gain and edema. She presents today for follow up. Reds Vest of 33% which is normal. Weight is up 11 pounds since clinic visit 1 month ago. She is drinking less than 2 liters of fluid per day "most days" per her report. She has been cutting back on her sodium intake. Notes her edema is predominantly to her abdomen but also present in her lower extremities. Endorses dyspnea and fatigue on exertion which is stable at her baseline.   EKGs/Labs/Other Studies Reviewed:   The following studies were reviewed today:  Echo 12/14/2020  1. Left ventricular ejection fraction, by estimation, is 55 to 60%. The  left ventricle has normal function. The left ventricle has no regional  wall motion abnormalities. The left ventricular internal cavity size was  moderately dilated. There is mild  left ventricular hypertrophy. Left ventricular diastolic parameters are  consistent with Grade I diastolic dysfunction (impaired relaxation).  Elevated left atrial pressure.   2. Right ventricular systolic function is normal. The right ventricular  size is normal. Tricuspid regurgitation signal is inadequate for assessing  PA pressure.   3. Left atrial size was mildly dilated.   4. Right atrial size was mildly dilated.   5. The mitral valve is degenerative. Mild mitral valve regurgitation. No  evidence of mitral stenosis. Moderate mitral annular calcification.   6. The aortic valve is tricuspid. There is mild calcification of the  aortic valve. There is mild thickening of the aortic valve. Aortic valve  regurgitation is trivial. Mild to moderate aortic valve  sclerosis/calcification is present, without any  evidence of aortic stenosis.   7. Aortic dilatation noted. There is mild dilatation of the ascending  aorta, measuring 36 mm.   EKG:  No EKG today  Recent Labs: 05/27/2020: TSH  3.742 12/03/2020: B Natriuretic Peptide 514.6 02/01/2021: Hemoglobin 9.8; Magnesium 2.3; Platelets 108 02/09/2021: ALT 12; BUN 27; Creat 2.20; Potassium 4.7; Sodium 134  Recent Lipid Panel    Component Value Date/Time   TRIG 163 (H) 08/03/2018 2152   Home Medications   Current Meds  Medication Sig  . cyanocobalamin (,VITAMIN B-12,) 1000 MCG/ML injection Inject 1,000 mcg into the muscle every 30 (thirty) days.   . furosemide (LASIX) 80 MG tablet Take 70m33mne tablet) in the morning daily with an additional 70mg83mlet as needed for weight gain of 2 pounds overnight or 5 pounds in week.  . hydrOXYzine (ATARAX/VISTARIL) 25 MG tablet Take 25 mg by mouth 3 (three) times daily.  . KLOMarland Kitchen-CON M10 10 MEQ tablet TAKE 10 MG (1 TABLET DAILY) DAILY . ALTERNATING DAYS TAKE 15 MG (1 AND 1/2 TABLET) DAILY  . lactulose (CHRONULAC) 10 GM/15ML solution Take 10 g by mouth daily as needed for mild constipation.  . Misc. Devices KIT Moderate compression hose 20-30 MM HG  . nystatin (MYCOSTATIN/NYSTOP) powder Apply 1 g topically daily as needed (rash).  . ondansetron (ZOFRAN) 4 MG tablet Take 4 mg by mouth every  8 (eight) hours as needed for nausea or vomiting.   Glory Rosebush Delica Lancets 01U MISC 2 (two) times daily.  Glory Rosebush ULTRA test strip 2 (two) times daily.  . pantoprazole (PROTONIX) 40 MG tablet Take 40 mg by mouth 2 (two) times daily.  Marland Kitchen spironolactone (ALDACTONE) 100 MG tablet TAKE 1 TABLET BY MOUTH TWICE A DAY  . Vitamin D, Ergocalciferol, (DRISDOL) 1.25 MG (50000 UNIT) CAPS capsule Take 50,000 Units by mouth once a week.  Marland Kitchen XIFAXAN 550 MG TABS tablet Take 550 mg by mouth 2 (two) times daily.    Review of Systems   All other systems reviewed and are otherwise negative except as noted above.  Physical Exam    VS:  BP 124/60 (BP Location: Left Arm, Patient Position: Sitting, Cuff Size: Normal)   Pulse 60   Ht 5' 2" (1.575 m)   Wt 199 lb 4 oz (90.4 kg)   LMP  (LMP Unknown)   SpO2 97%   BMI  36.44 kg/m  , BMI Body mass index is 36.44 kg/m.  Wt Readings from Last 3 Encounters:  02/14/21 199 lb 4 oz (90.4 kg)  02/01/21 196 lb 3.4 oz (89 kg)  01/13/21 188 lb (85.3 kg)    GEN: Well nourished, overweight, well developed, in no acute distress. HEENT: normal. Neck: Supple, no JVD, carotid bruits, or masses. Cardiac: RRR, no murmurs, rubs, or gallops. No clubbing, cyanosis.  2+ pitting edema to bilateral lower extremity.  No erythema, signs of infection.  Radials//PT 2+ and equal bilaterally.  Respiratory:  Respirations regular and unlabored, clear to auscultation bilaterally. GI: Soft, nontender, nondistended. MS: No deformity or atrophy. Skin: Warm and dry, no rash. Neuro:  Strength and sensation are intact. Psych: Normal affect.  Assessment & Plan    1. HFpEF/cirrhosis - Weight up 11lbs compared to 1 month ago with bilateral LE 2+ pitting edema. Marland Kitchen NYHA II. No orthopnea, PND.  Continuing Spironolactone 100 mg twice daily at the direction of Dr. Marius Ditch of gastroenterology.  With persistent weight gain despite Lasix 31m BID, transition to Torsemide 459mBID. BMP in 1 week. [facility]. Next appt not til 03/03/21, we will reassess her weight via phone prior to that appt to determine if IV Lasix is needed. Previously had good urine output in response to IV Lasix. Her dry weight is approx 180 lbs on her home scale.  2. Hyponatremia - Lab work 02/09/21 Na 134. Reiterated importance of fluid restriction. Careful monitoring and follow-up with nephrology recommended.    3. Coronary artery calcification on CT scan and aortic atherosclerosis - Stable with no anginal symptoms.  No indication for ischemic evaluation this time.  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  No aspirin due to esophageal varices  4. CKD IIIb -continue to follow with nephrology.    5. Anemia - Continue to  follow with hematology.   Disposition: Follow up in 1 month(s) with Dr. EnSaunders Revelr APP   Signed, CaLoel DubonnetNP 02/14/2021, 10:48 AM [place]

## 2021-02-14 NOTE — Patient Instructions (Addendum)
Medication Instructions:  Your physician has recommended you make the following change in your medication:  STOP Furosemide (Lasix)  START Torsemide 84m (one tablet) twice daily If you get down to 180 pounds, switch to once per day.   CLoel Dubonnet NP will send Dr. CMike Gipa message to see if there is availability for IV Lasix this week.  *If you need a refill on your cardiac medications before your next appointment, please call your pharmacy*   Lab Work: Your physician recommends that you [facility] for lab work in [facility] If you have labs (blood work) drawn today and your tests are completely normal, you will receive your results only by: .Marland KitchenMyChart Message (if you have MyChart) OR . A paper copy in the mail If you have any lab test that is abnormal or we need to change your treatment, we will call you to review the results.   Testing/Procedures: Your Reds Vest today did not show significant volume overload.   Follow-Up: [facility] you and your health needs are our priority.  As part of our continuing mission to provide you with exceptional heart care, we have created designated Provider Care Teams.  These Care Teams include your primary Cardiologist (physician) and Advanced Practice Providers (APPs -  Physician Assistants and Nurse Practitioners) who all work together to provide you with the care you need, when you need it.   Your next appointment:   1 month(s)  The format for your next appointment:   In Person  Provider:   You may see CNelva Bush MD or one of the following Advanced Practice Providers on your designated Care Team:    CMurray Hodgkins NP  RChristell Faith PA-C  JMarrianne Mood PA-C  Cadence FKathlen Mody [place] CLaurann Montana NP  Other Instructions  Heart Healthy Diet Recommendations: A low-salt diet is recommended. Meats should be grilled, baked, or boiled. Avoid fried foods. Focus on lean protein sources like  fish or chicken with vegetables and fruits. The American Heart Association is a GMicrobiologist  American Heart Association Diet and Lifeystyle Recommendations   Continue to drink less than 2 liters of fluid per day and elevating your legs when sitting.

## 2021-02-14 NOTE — Telephone Encounter (Signed)
02/14/2021 Spoke w/ pt and informed her of PET scan and f/u MD appts per 02/11/21 LOS. PT confirmed appts  SRW

## 2021-02-16 ENCOUNTER — Telehealth: Payer: Self-pay

## 2021-02-16 DIAGNOSIS — K7290 Hepatic failure, unspecified without coma: Secondary | ICD-10-CM

## 2021-02-16 DIAGNOSIS — K7460 Unspecified cirrhosis of liver: Secondary | ICD-10-CM

## 2021-02-16 NOTE — Telephone Encounter (Signed)
-----  Message from Lin Landsman, MD sent at 02/16/2021 10:14 AM EDT ----- Regarding: Referral Crystal [name]  Please refer her to dietitian Dx: Cirrhosis of liver and CKD, counseling on high-protein diet  RV

## 2021-02-16 NOTE — Telephone Encounter (Signed)
Placed referral

## 2021-02-17 ENCOUNTER — Telehealth: Payer: Self-pay | Admitting: Internal Medicine

## 2021-02-17 ENCOUNTER — Other Ambulatory Visit (HOSPITAL_COMMUNITY): Payer: Self-pay | Admitting: Interventional Radiology

## 2021-02-17 ENCOUNTER — Telehealth (HOSPITAL_COMMUNITY): Payer: Self-pay

## 2021-02-17 DIAGNOSIS — K922 Gastrointestinal hemorrhage, unspecified: Secondary | ICD-10-CM

## 2021-02-17 NOTE — Telephone Encounter (Signed)
Patient calling Patient saw Vella Raring on 02/14/21  States she has not lost any fluid and would like to discuss Please call

## 2021-02-17 NOTE — Telephone Encounter (Signed)
Called to schedule tips shunt study. Pt doesn't think she wants to have this procedure done. Anderson Malta will check back with her next week to see what she would like to do. Pt agreed with this plan. AW

## 2021-02-18 NOTE — Telephone Encounter (Addendum)
Spoke with the patient. Patient sts that she has not had any change in her weight since her diuretic was changed from lasix to torsemide 33m bid on 02/14/21. She reports to adherence to her fluid and sodium restriction. She has just had a mild increase in urination. Her weight at home has not changed since being seen and was still 200 lbs this morning.  It seems that she has been weighing herself daily at different times. Adv her that she should be weighing herself daily under the same circumstances. Adv her that when she awakes she should empty her bladder and go ahead and weigh herself, this should be done daily under the same circumstances.  Patient sts that her sob and swelling are the unchanged and has not improved. She would like to know if it could be arranged for her to have IV diuretics.  Adv the patient that I will fwd the update to CLaurann Montana NP for her advice.

## 2021-02-18 NOTE — Telephone Encounter (Signed)
Patient made aware of Caitlin's response and recommendation. Patient sts that she will have her bmp repeated when she is at [facility] for a CT scan on 02/22/21. Adv her that the need for IV lasix will be reassessed after the bmp results are reviewed by [facility].  Patient verbalized understanding and voiced appreciation for the call back.

## 2021-02-18 NOTE — Telephone Encounter (Signed)
Plan for repeat BMP Monday 02/21/21 as recommended at recent clinic visit to reassess kidney function/electrolytes after medication change. Have to be mindful of kidney function with increasing/changing diuretics.   Her next appt at the cancer center where she could get IV diuretics is not until 03/01/21. If she wishes to have it done sooner, pending lab results, we could arrange at short stay but I do not think they will be able to use her port. If she wishes to have IV Lasix even without port (if labs allow) we could potentially arrange for Tues/Wed next week but need lab results prior.  Please re-emphasize low salt diet and strict adherence to <2L fluid. Agree with recommendation to weigh daily at same time as instructed.   Loel Dubonnet, NP

## 2021-02-21 ENCOUNTER — Other Ambulatory Visit
Admission: RE | Admit: 2021-02-21 | Discharge: 2021-02-21 | Disposition: A | Discharge status: Home / Self Care | Payer: Medicare HMO | Attending: Family | Admitting: Family

## 2021-02-21 DIAGNOSIS — I5032 Chronic diastolic (congestive) heart failure: Secondary | ICD-10-CM

## 2021-02-21 DIAGNOSIS — H524 Presbyopia: Secondary | ICD-10-CM | POA: Diagnosis not present

## 2021-02-21 DIAGNOSIS — K7460 Unspecified cirrhosis of liver: Secondary | ICD-10-CM | POA: Diagnosis not present

## 2021-02-21 DIAGNOSIS — R609 Edema, unspecified: Secondary | ICD-10-CM | POA: Insufficient documentation

## 2021-02-21 LAB — BASIC METABOLIC PANEL
Anion gap: 8 (ref 5–15)
BUN: 27 mg/dL — ABNORMAL HIGH (ref 8–23)
CO2: 23 mmol/L (ref 22–32)
Calcium: 8.7 mg/dL — ABNORMAL LOW (ref 8.9–10.3)
Chloride: 103 mmol/L (ref 98–111)
Creatinine, Ser: 2.12 mg/dL — ABNORMAL HIGH (ref 0.44–1.00)
GFR, Estimated: 25 mL/min — ABNORMAL LOW (ref >60)
Glucose, Bld: 162 mg/dL — ABNORMAL HIGH (ref 70–99)
Potassium: 4 mmol/L (ref 3.5–5.1)
Sodium: 134 mmol/L — ABNORMAL LOW (ref 135–145)

## 2021-02-22 ENCOUNTER — Telehealth: Payer: Self-pay | Admitting: *Deleted

## 2021-02-22 NOTE — Telephone Encounter (Signed)
-----  Message from Loel Dubonnet, NP sent at 02/21/2021 11:23 AM EDT ----- Kidney function overall stable. Stable mild hyponatremia. Normal potassium. Good result!  Please reassess weight, shortness of breath, swelling. At last clinic visit we switched her from Furosemide to Torsemide. If symptoms improving, no indication for IV diuresis. If weight still not decreasing, we can consider dose of IV Lasix in short stay (would not be able to use port) or on her next appt at [facility] 03/01/21 (would be able to use port). Whichever is her preference.

## 2021-02-22 NOTE — Telephone Encounter (Signed)
Spoke to pt. Notified of lab results.   Pt reports weight has not changed, it has stayed consistent at 200lbs every day.  Swelling unchanged. Shortness of breath has remained unchanged, still gets SOB w/ minimal exertion.   She prefers to have IV Lasix using her port at next appt at [facility] on 03/01/21.

## 2021-02-23 ENCOUNTER — Other Ambulatory Visit: Payer: Self-pay

## 2021-02-23 ENCOUNTER — Other Ambulatory Visit: Payer: Self-pay | Admitting: Family

## 2021-02-23 ENCOUNTER — Telehealth: Payer: Self-pay | Admitting: Gastroenterology

## 2021-02-23 ENCOUNTER — Encounter
Admission: RE | Admit: 2021-02-23 | Discharge: 2021-02-23 | Disposition: A | Discharge status: Home / Self Care | Payer: Medicare HMO | Source: Ambulatory Visit | Attending: Hematology and Oncology | Admitting: Hematology and Oncology

## 2021-02-23 DIAGNOSIS — K766 Portal hypertension: Secondary | ICD-10-CM | POA: Diagnosis not present

## 2021-02-23 DIAGNOSIS — R918 Other nonspecific abnormal finding of lung field: Secondary | ICD-10-CM | POA: Diagnosis not present

## 2021-02-23 DIAGNOSIS — K7460 Unspecified cirrhosis of liver: Secondary | ICD-10-CM | POA: Diagnosis not present

## 2021-02-23 DIAGNOSIS — I5032 Chronic diastolic (congestive) heart failure: Secondary | ICD-10-CM

## 2021-02-23 DIAGNOSIS — R911 Solitary pulmonary nodule: Secondary | ICD-10-CM | POA: Diagnosis not present

## 2021-02-23 LAB — GLUCOSE, CAPILLARY: Glucose-Capillary: 104 mg/dL — ABNORMAL HIGH (ref 70–99)

## 2021-02-23 MED ORDER — FLUDEOXYGLUCOSE F - 18 (FDG) INJECTION
10.3000 | Freq: Once | INTRAVENOUS | Status: AC | PRN
  Administered 2021-02-23: 10.5 via INTRAVENOUS

## 2021-02-23 NOTE — Telephone Encounter (Signed)
Patient came into office to ask if Dr Marius Ditch has talked to Dr Jacqualyn Posey about a procedure that he wants to do.  Please call patient to advise.

## 2021-02-23 NOTE — Telephone Encounter (Signed)
Hi Dr. Mike Gip,  I switched Miss Levier from Furosemide to Torsemide to facilitate diuresis as she was volume up at recent clinic visit. Despite change, her weight has remained at 200lbs. Would it be possible for her  to receive Lasix IV 68m x1 dose during her upcoming [facility] 03[facility]/21? If so, I will fax the orders and contact her regarding additional potassium supplementation as previous. She had labs 02/21/21 with K 4.0, GFR 25, creatinine 2.12.  Thanks so much,  CLoel Dubonnet NP

## 2021-02-23 NOTE — Telephone Encounter (Signed)
Spoke to pt and made aware that Lasix has been scheduled for her upcoming cancer center appointment to be given via port.  Notified pt to take an additional 54mq of potassium that afternoon.  Pt verbalized understanding and very appreciative.

## 2021-02-23 NOTE — Telephone Encounter (Signed)
Thank you Dr. Mike Gip and Levada Dy for your assistance! I just faxed the orders to [phone]. They are also available in Epic under 'order review' and 'signed and held orders' if needed.  Jinny Blossom - Will you please let Miss Brines know that Lasix at her upcoming cancer center appointment has been scheduled. She should take an additional 59mq of potassium that afternoon to prevent hypokalemia due to increased diuresis.   Best, CLoel Dubonnet NP

## 2021-02-23 NOTE — Telephone Encounter (Signed)
Lasix has been added to the appt note for infusion.

## 2021-02-24 DIAGNOSIS — H524 Presbyopia: Secondary | ICD-10-CM | POA: Diagnosis not present

## 2021-02-24 NOTE — Progress Notes (Signed)
[facility]  [address], [address] [place],  [address] Phone: [phone]  Fax: [phone]   Clinic Day:  03/01/2021  Referring physician: Martin Majestic, *  Chief Complaint: Crystal [name] is a 70 y.o. female with decompensated cirrhosis s/p TIPS procedure, right sided pulmonary nodules who is seen for review of PET scan, discussion regarding direction of therapy, and diuresis.  HPI: The patient was last seen in the medical oncology clinic on 02/11/2021 via telephone. At that time, she had chronic issues with fluid retention.  She denied any increased shortness of breath.   The patient saw [name], NP on 02/14/2021. Because she was having persistent weight gain despite Lasix 80 mg BID, she was transitioned to Torsemide 40 mg BID.  PET scan on 02/23/2021 revealed that the slowly enlarging right lung masses demonstrated only low level hypermetabolic activity, similar to remote PET-CT from 3 years ago. This argued against a malignant process. Given the parotid calcifications, consider Sjogren syndrome and associated connective tissue disorder accounting for the pulmonary findings. Continued CT follow-up was recommended. There was low-level hypermetabolic activity in the left nasopharynx without associated focal lesion on CT imaging. There was no residual parotid hypermetabolic activity. There were stable chronic findings of cirrhosis and portal hypertension post TIPS.  During the interim, she has been "ok". She has a lot of extra fluid. She reports leg and abdominal swelling and shortness of breath on exertion. She can barely walk due to the swelling. She has always propped up her head to sleep. The swelling has not improved since she switched to Torsemide. The patient denied bleeding of any kind.  The patient declined an ENT referral at this time.   Past Medical History:  Diagnosis Date  . Acute GI bleeding 02/09/2018  . Acute upper  gastrointestinal bleeding 08/03/2018  . Anemia    TAKES IRON TAB  . Arthritis    SHOULDER  . B12 deficiency 02/18/2018  . Bleeding    GI  3/19  . Bronchitis    HX OF  . CHF (congestive heart failure) ([place])   . Cirrhosis ([place])   . Colon cancer screening   . Diabetes mellitus without complication ([place])    TYPE 2  . Fatty (change of) liver, not elsewhere classified   . Full dentures    UPPER AND LOWER  . Iron deficiency anemia due to chronic blood loss 02/18/2018  . Lung nodule, multiple   . Pernicious anemia 02/22/2018  . PUD (peptic ulcer disease)   . Raynaud's syndrome   . Upper GI bleeding 08/03/2018    Past Surgical History:  Procedure Laterality Date  . CATARACT EXTRACTION W/PHACO Right 02/06/2018   Procedure: CATARACT EXTRACTION PHACO AND INTRAOCULAR LENS PLACEMENT ([place]) RIGHT DIABETIC;  Surgeon: Leandrew Koyanagi, MD;  Location: [facility];  Service: Ophthalmology;  Laterality: Right;  DIABETIC, ORAL MED  . CATARACT EXTRACTION W/PHACO Left 04/30/2018   Procedure: CATARACT EXTRACTION PHACO AND INTRAOCULAR LENS PLACEMENT ([facility]);  Surgeon: Leandrew Koyanagi, MD;  Location: [facility] OR[facility];  Service: Ophthalmology;  Laterality: Left;  [place] 01:04 AP% 21.3 CDE 13.66 Fluid pack lot # [id number]  . COLONOSCOPY WITH PROPOFOL N/A 03/25/2018   Procedure: COLONOSCOPY WITH PROPOFOL;  Surgeon: Lin Landsman, MD;  Location: [facility] ENDOSCOPY;  Service: Gastroenterology;  Laterality: N/A;  . DENTAL SURGERY     EXTRACTIONS  . ELECTROMAGNETIC NAVIGATION BROCHOSCOPY Right 01/27/2019   Procedure: ELECTROMAGNETIC NAVIGATION BRONCHOSCOPY;  Surgeon: [name], MD;  Location: [facility];  Service: Cardiopulmonary;  Laterality: Right;  . ENTEROSCOPY N/A 07/28/2020   Procedure: Push ENTEROSCOPY;  Surgeon: Lin Landsman, MD;  Location: [facility] ENDOSCOPY;  Service: Gastroenterology;  Laterality: N/A;  . ESOPHAGOGASTRODUODENOSCOPY N/A 08/03/2018   Procedure: ESOPHAGOGASTRODUODENOSCOPY (EGD);   Surgeon: Lin Landsman, MD;  Location: [facility] ENDOSCOPY;  Service: Gastroenterology;  Laterality: N/A;  . ESOPHAGOGASTRODUODENOSCOPY (EGD) WITH PROPOFOL N/A 02/10/2018   Procedure: ESOPHAGOGASTRODUODENOSCOPY (EGD) WITH PROPOFOL;  Surgeon: Jonathon Bellows, MD;  Location: [facility] ENDOSCOPY;  Service: Gastroenterology;  Laterality: N/A;  . ESOPHAGOGASTRODUODENOSCOPY (EGD) WITH PROPOFOL N/A 03/25/2018   Procedure: ESOPHAGOGASTRODUODENOSCOPY (EGD) WITH PROPOFOL;  Surgeon: Lin Landsman, MD;  Location: [facility] ENDOSCOPY;  Service: Gastroenterology;  Laterality: N/A;  . ESOPHAGOGASTRODUODENOSCOPY (EGD) WITH PROPOFOL N/A 04/22/2018   Procedure: ESOPHAGOGASTRODUODENOSCOPY (EGD) WITH PROPOFOL with band ligation;  Surgeon: Lin Landsman, MD;  Location: [place];  Service: Gastroenterology;  Laterality: N/A;  . ESOPHAGOGASTRODUODENOSCOPY (EGD) WITH PROPOFOL N/A 06/24/2018   Procedure: ESOPHAGOGASTRODUODENOSCOPY (EGD) WITH PROPOFOL;  Surgeon: Lin Landsman, MD;  Location: [facility] ENDOSCOPY;  Service: Gastroenterology;  Laterality: N/A;  . ESOPHAGOGASTRODUODENOSCOPY (EGD) WITH PROPOFOL N/A 11/19/2018   Procedure: ESOPHAGOGASTRODUODENOSCOPY (EGD) WITH PROPOFOL;  Surgeon: Lin Landsman, MD;  Location: [facility] ENDOSCOPY;  Service: Gastroenterology;  Laterality: N/A;  . ESOPHAGOGASTRODUODENOSCOPY (EGD) WITH PROPOFOL N/A 03/08/2020   Procedure: ESOPHAGOGASTRODUODENOSCOPY (EGD) WITH PROPOFOL;  Surgeon: Lin Landsman, MD;  Location: [facility] ENDOSCOPY;  Service: Gastroenterology;  Laterality: N/A;  . EYE SURGERY    . GIVENS CAPSULE STUDY N/A 03/29/2020   Procedure: GIVENS CAPSULE STUDY;  Surgeon: Lin Landsman, MD;  Location: [facility] ENDOSCOPY;  Service: Gastroenterology;  Laterality: N/A;  . HEMORRHOID BANDING  03/25/2018   Procedure: HEMORRHOID BANDING;  Surgeon: Lin Landsman, MD;  Location: [facility] ENDOSCOPY;  Service: Gastroenterology;;  . IR EMBO ART  VEN HEMORR LYMPH EXTRAV  INC GUIDE  ROADMAPPING  08/07/2018  . IR RADIOLOGIST EVAL & MGMT  09/03/2018  . IR RADIOLOGIST EVAL & MGMT  12/17/2018  . IR RADIOLOGIST EVAL & MGMT  06/24/2019  . IR RADIOLOGIST EVAL & MGMT  01/22/2020  . IR RADIOLOGIST EVAL & MGMT  02/09/2021  . IR TIPS  08/07/2018  . PORTA CATH INSERTION N/A 07/30/2020   Procedure: PORTA CATH INSERTION;  Surgeon: Katha Cabal, MD;  Location: [place] CV LAB;  Service: Cardiovascular;  Laterality: N/A;  . RADIOLOGY WITH ANESTHESIA N/A 08/07/2018   Procedure: TIPS;  Surgeon: Corrie Mckusick, DO;  Location: [place];  Service: Anesthesiology;  Laterality: N/A;  . TONSILLECTOMY      Family History  Problem Relation Age of Onset  . CAD Father   . Heart attack Father 63  . Cancer Maternal Uncle   . Seizures Mother     Social History:  reports that she has never smoked. She has never used smokeless tobacco. She reports previous alcohol use. She reports that she does not use drugs. She retired from [employer] in 11/2017. She lives in [place]. The patient is alone today.  Allergies: No Known Allergies  Current Medications: Current Outpatient Medications  Medication Sig Dispense Refill  . cyanocobalamin (,VITAMIN B-12,) 1000 MCG/ML injection Inject 1,000 mcg into the muscle every 30 (thirty) days.     . hydrOXYzine (ATARAX/VISTARIL) 25 MG tablet Take 25 mg by mouth 3 (three) times daily.    Marland Kitchen KLOR-CON M10 10 MEQ tablet TAKE 10 MG (1 TABLET DAILY) DAILY . ALTERNATING DAYS TAKE 15 MG (1 AND 1/2 TABLET) DAILY 38 tablet 3  .  lactulose (CHRONULAC) 10 GM/15ML solution Take 10 g by mouth daily as needed for mild constipation.    . Misc. Devices KIT Moderate compression hose 20-30 MM HG 1 kit 0  . ondansetron (ZOFRAN) 4 MG tablet Take 4 mg by mouth every 8 (eight) hours as needed for nausea or vomiting.     Glory Rosebush Delica Lancets 32G MISC 2 (two) times daily.    Glory Rosebush ULTRA test strip 2 (two) times daily.    . pantoprazole (PROTONIX) 40 MG tablet Take 40 mg by  mouth 2 (two) times daily.    . sodium bicarbonate 650 MG tablet SMARTSIG:1300 Milligram(s) By Mouth Twice Daily    . spironolactone (ALDACTONE) 100 MG tablet TAKE 1 TABLET BY MOUTH TWICE A DAY 60 tablet 2  . torsemide (DEMADEX) 20 MG tablet Take 2 tablets (40 mg total) by mouth 2 (two) times daily. 120 tablet 2  . Vitamin D, Ergocalciferol, (DRISDOL) 1.25 MG (50000 UNIT) CAPS capsule Take 50,000 Units by mouth once a week.    . nystatin (MYCOSTATIN/NYSTOP) powder Apply 1 g topically daily as needed (rash). (Patient not taking: Reported on 03/01/2021)    . XIFAXAN 550 MG TABS tablet Take 550 mg by mouth 2 (two) times daily. (Patient not taking: Reported on 03/01/2021)     No current facility-administered medications for this visit.   Facility-Administered Medications Ordered in Other Visits  Medication Dose Route Frequency Provider Last Rate Last Admin  . sodium chloride flush (NS) 0.9 % injection 10 mL  10 mL Intravenous PRN Nolon Stalls C, MD   10 mL at 09/14/20 1320    Review of Systems  Constitutional: Negative for chills, diaphoresis, fever, malaise/fatigue and weight loss (up 7 lbs, fluid).  HENT: Negative.  Negative for congestion, ear discharge, ear pain, hearing loss, nosebleeds, sinus pain, sore throat and tinnitus.   Eyes: Negative for blurred vision.  Respiratory: Positive for shortness of breath (with exertion). Negative for cough, hemoptysis and sputum production.   Cardiovascular: Positive for orthopnea and leg swelling. Negative for chest pain and palpitations.  Gastrointestinal: Negative for abdominal pain, blood in stool, constipation, diarrhea, heartburn, melena, nausea and vomiting.       Fluid in abdomen.  Genitourinary: Negative.  Negative for dysuria, frequency, hematuria and urgency.  Musculoskeletal: Negative.  Negative for back pain, joint pain, myalgias and neck pain.  Skin: Negative.  Negative for itching and rash.  Neurological: Negative for dizziness,  sensory change, speech change, focal weakness, weakness and headaches.  Endo/Heme/Allergies: Negative.  Does not bruise/bleed easily.  Psychiatric/Behavioral: Negative.  Negative for depression and memory loss. The patient is not nervous/anxious and does not have insomnia.   All other systems reviewed and are negative.  Performance status (ECOG): 1-2  Vital Signs Blood pressure (!) 130/59, pulse (!) 59, temperature (!) 97.4 F (36.3 C), resp. rate 18, weight 206 lb 0.3 oz (93.5 kg).   Physical Exam Vitals and nursing note reviewed.  Constitutional:      General: She is not in acute distress.    Appearance: She is not diaphoretic.  HENT:     Head: Normocephalic and atraumatic.     Mouth/Throat:     Mouth: Mucous membranes are moist.     Pharynx: Oropharynx is clear.  Eyes:     General: No scleral icterus.    Extraocular Movements: Extraocular movements intact.     Conjunctiva/sclera: Conjunctivae normal.     Pupils: Pupils are equal, round, and reactive to light.  Cardiovascular:     Rate and Rhythm: Normal rate and regular rhythm.     Heart sounds: Normal heart sounds. No murmur heard.   Pulmonary:     Effort: Pulmonary effort is normal. No respiratory distress.     Breath sounds: Normal breath sounds. No wheezing or rales.  Chest:     Chest wall: No tenderness.  Breasts:     Right: No axillary adenopathy or supraclavicular adenopathy.     Left: No axillary adenopathy or supraclavicular adenopathy.    Abdominal:     General: Bowel sounds are normal. There is no distension.     Palpations: Abdomen is soft. There is no mass.     Tenderness: There is no abdominal tenderness. There is no guarding or rebound.     Comments: Edema in flanks. Abdominal swelling.  Musculoskeletal:        General: No swelling or tenderness. Normal range of motion.     Cervical back: Normal range of motion and neck supple.     Right lower leg: Edema present.     Left lower leg: Edema present.   Lymphadenopathy:     Head:     Right side of head: No preauricular, posterior auricular or occipital adenopathy.     Left side of head: No preauricular, posterior auricular or occipital adenopathy.     Cervical: No cervical adenopathy.     Upper Body:     Right upper body: No supraclavicular or axillary adenopathy.     Left upper body: No supraclavicular or axillary adenopathy.     Lower Body: No right inguinal adenopathy. No left inguinal adenopathy.  Skin:    General: Skin is warm and dry.     Comments: Increase number of spider angiomas on chest and back.  Neurological:     Mental Status: She is alert and oriented to person, place, and time.  Psychiatric:        Behavior: Behavior normal.        Thought Content: Thought content normal.        Judgment: Judgment normal.    Imaging studies: 02/09/2018:  Abdomen and pelvic CTrevealed an 1.8 cm spiculated density in right lower lobe concerning for malignancy. There was mildly nodular hepatic contourconcerning for hepatic cirrhosis. There was moderate splenomegalysuggesting portal venous hypertension. Mild ascites was noted. There were mildly enlarged retroperitoneal lymph nodes (largest 1.1 cm) concerning for possible metastatic disease or malignancy. There was a 7 cm left ovarian cyst. Further evaluation with MRI was recommended to evaluate for possible neoplasm.  05/29/2018:  RUQ ultrasoundrevealed cholelithiasis. There was a thickened edematous gallbladder wall. The wall thickening could be due to hypoproteinemia/hypoalbuminemia or cholecystitis. Negative sonographic Murphy's sign. There was a heterogeneous slightly nodular liver consistent with cirrhosis.  04/24/2019:  Abdominal/pelvic arterial/venous ultrasound doppler revealed a patent TIPS and no evidence of persisting ascites.  11/12/2019:  Abdominal MRI including MRCPrevealed cirrhosis with TIPS in place.There was no biliary duct dilatation.There was cholelithiasis  without acute cholecystitis. Right hepatic lobe 9 mm lesion whichwas indeterminate, LR 3.This was possibly present on the prior CT of 2019. Recommendation includedrepeat pre and post contrast abdominal MRI at 3-6 months. There as small volume abdominal ascites.The right lower lobe pulmonary nodulewas suboptimally evaluated. 02/09/2021:  Ultrasound abdominal pelvic arterial/venous flow doppler revealed patent TIPS, with the flow velocities essentially unchanged from the comparison duplex studies. Remainder of the exam was somewhat limited for complete evaluation of the hepatic vasculature given the reduced sonographic window.  02/25/2018:  Chest CTrevealed a 2.3 x 1.5 x 1.0 cm perifissural nodulewith spiculated margins in the right middle lobe. There was a 1.5 x 1.5 x 1.4 cm right lower lobe spiculated nodule. There was a moderate left sided pleural effusion.  03/08/2018:  PET scan revealed a 2.5 x 1.8 cm sub solid nodular opacity in theRML(SUV 2.7) and a 1.6 cm roundedslightly spiculated RLL pulmonary nodule(SUV 2.1). There were no enlarged or hypermetabolic mediastinal or hilar lymph nodes. There was a small left pleural effusion. 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 05/15/2018:  Chest CT revealed no significant changes in the 2 previously noted nodules in the RIGHT lung. Irregular RML lesion measures 2.4 x 1.4 cm (previously 2.3 x 1.5 cm). Spiculated central RLL lesion measures 1.8 x 1.4 cm (previously 1.6 x 1.5 cm). Both areas remain concerning for neoplasm. There was interval decrease in ascites volume overall. Upper abdominal lymphadenopathy noted that was felt to be reactive due to underlying liver disease.  08/15/2018:  Chest CTrevealed the spiculated right lower lobe lung lesion appeared slightly increased and was not significantly changed in size compared with previous exam.  Morphologically, this was worrisome for a small bronchogenic neoplasm. The right middle lobe lung nodule was slightly decreased in size in the interval and may be post infectious or inflammatory in etiology. 01/24/2019:  Super D chest CTrevealed a 3.2 x 2.3 cm ill-defined anterior right lung nodule compared to 2.9 x 2.3 cm previously. Minor fissurewasnot well, and as such, this nodule can not be confidently localized to the right upper or middle lobe. The right lower lobe nodule measured2.0 x 2.0 cm comparedto 2.0 x 1.9 cm. 08/07/2019:  Chest CTrevealed no significant interval change in size right middle lobe and right lower lobe lung nodules.Liverwascompatible with cirrhosiss/pTIPS. 02/02/2020:  Chest CTrevealed stable nodules within the right middle and right lower lobes.Therewerecirrhotic changesof the liver with TIPS shunt in place. There was mild prominence of the ascending aorta to 4 cm. Recommendedannual imaging followup by CTA or MRA. 02/01/2021:  Chest CT revealed increasing size of a nodule along the minor fissure in the RIGHT chest with further fissural thickening extending from this area on sagittal and coronal images in particular was craniocaudal in sagittal plane at 1.6 cm (compared to 1.8 cm). Overall findings were of uncertain significance. Given increased fissural thickening short interval follow-up or even PET evaluation could potentially be of benefit. There was a spiculated nodule in the RIGHT lower lobe measuring 2.4 x 1.6 cm previously 2.5 x 1.9 cm. There were findings of liver disease, TIPS and splenomegaly.    03/18/2018:  Pelvic ultrasounddemonstrated a simple cyst in the LEFT ovarymeasuring 6.1 x 4.5 x 6.6 cm. Repeat imaging recommended in 1 year to document stability.  Patient declined follow-up. 06/15/2020:  Renal ultrasound revealed marked splenomegaly (13.9 x 15.2 x 5.6 cm; 618 ml) similar to comparison study of 05/28/2020 and MR of the abdomen on  11/12/2019.   Infusion on 03/01/2021  Component Date Value Ref Range Status  . Sodium 03/01/2021 130* 135 - 145 mmol/L Final  . Potassium 03/01/2021 3.9  3.5 - 5.1 mmol/L Final  . Chloride 03/01/2021 101  98 - 111 mmol/L Final  . CO2 03/01/2021 23  22 - 32 mmol/L Final  . Glucose, Bld 03/01/2021 136* 70 - 99 mg/dL Final   Glucose reference range applies only to samples taken after fasting for at least 8 hours.  . BUN 03/01/2021 26*  8 - 23 mg/dL Final  . Creatinine, Ser 03/01/2021 1.89* 0.44 - 1.00 mg/dL Final  . Calcium 03/01/2021 8.6* 8.9 - 10.3 mg/dL Final  . GFR, Estimated 03/01/2021 29* >60 mL/min Final   Comment: (NOTE) Calculated using the CKD-EPI Creatinine Equation (2021)   . Anion gap 03/01/2021 6  5 - 15 Final   Performed at [facility], [address] [place], [place] [address]  . WBC 03/01/2021 5.6  4.0 - 10.5 K/uL Final  . RBC 03/01/2021 3.76* 3.87 - 5.11 MIL/uL Final  . Hemoglobin 03/01/2021 9.7* 12.0 - 15.0 g/dL Final  . HCT 03/01/2021 29.7* 36.0 - 46.0 % Final  . MCV 03/01/2021 79.0* 80.0 - 100.0 fL Final  . MCH 03/01/2021 25.8* 26.0 - 34.0 pg Final  . MCHC 03/01/2021 32.7  30.0 - 36.0 g/dL Final  . RDW 03/01/2021 16.5* 11.5 - 15.5 % Final  . Platelets 03/01/2021 144* 150 - 400 K/uL Final  . nRBC 03/01/2021 0.0  0.0 - 0.2 % Final  . Neutrophils Relative % 03/01/2021 58  % Final  . Neutro Abs 03/01/2021 3.2  1.7 - 7.7 K/uL Final  . Lymphocytes Relative 03/01/2021 20  % Final  . Lymphs Abs 03/01/2021 1.1  0.7 - 4.0 K/uL Final  . Monocytes Relative 03/01/2021 9  % Final  . Monocytes Absolute 03/01/2021 0.5  0.1 - 1.0 K/uL Final  . Eosinophils Relative 03/01/2021 12  % Final  . Eosinophils Absolute 03/01/2021 0.7* 0.0 - 0.5 K/uL Final  . Basophils Relative 03/01/2021 1  % Final  . Basophils Absolute 03/01/2021 0.0  0.0 - 0.1 K/uL Final  . Immature Granulocytes 03/01/2021 0  % Final  . Abs Immature Granulocytes 03/01/2021 0.02  0.00 - 0.07 K/uL Final    Performed at [facility], [address], [place], [place] [address]    Assessment:  Axtell is a 70 y.o. female with a right lower lobe pulmonary nodule. She denies any smoking history.  Abdomen and pelvic CTon 02/09/2018 revealed an 1.8 cm spiculated density in right lower lobe concerning for malignancy. There was mildly nodular hepatic contourconcerning for hepatic cirrhosis. There was moderate splenomegalysuggesting portal venous hypertension. Mild ascites was noted. There were mildly enlarged retroperitoneal lymph nodes (largest 1.1 cm) concerning for possible metastatic disease or malignancy. There was a 7 cm left ovarian cyst. Further evaluation with MRI was recommended to evaluate for possible neoplasm. CA125was 351.3 and CEA1.1 on 02/21/2018.   Chest CT on 02/01/2021 revealed increasing size of a nodule along the minor fissure in the RIGHT chest with further fissural thickening extending from this area on sagittal and coronal images in particular was craniocaudal in sagittal plane at 1.6 cm (compared to 1.8 cm). Overall findings were of uncertain significance. Given increased fissural thickening short interval follow-up or even PET evaluation could potentially be of benefit. There was a spiculated nodule in the RIGHT lower lobe measuring 2.4 x 1.6 cm previously 2.5 x 1.9 cm. There were findings of liver disease, TIPS and splenomegaly.   She was admitted to [facility] from 02/09/2018 - 04/02/2019with a variceal hemorrhage. EGDon 02/10/2018 revealed 5 columns of grade III esophageal varices in the lower third of the esophagus. There was stigmata of recent bleeding and red wale signs. She underwent variceal ligation x 10. She required 3 units of PRBCs.  EGDon 03/25/2018 revealed 2 angiectasias(non-bleeding) in the second portion of the duodenum, and a few diminutive (non-bleeding) angiectasias in the prepyloric region of  the stomach that were treated APC.  There were 4 non-bleeding ulcers ([place] Class III) found in the gastric fundus. There were 4 columns of large non-bleeding varicesin the lower third of the esophagus, of which demonstrated no stigmata of bleeding. Varices were banded. EGD on 04/22/2018 revealed one non-bleeding cratered gastric ulcer([place]rest Class III) in the lesser curvature of the gastric body. Duodenal bulb and second portion of the duodenum were normal. Moderate portal hypertensive gastropathy noted in the stomach. Large varices in the lower third of the esophagus noted. 2 bands were placed with incomplete eradication of the lesions. EGD on 03/08/2020 revealed a few non-bleeding angioectasias in the duodenum and a gastric antral vascular ectasia with bleeding treated with argon plasma coagulation (APC).  There was a single non-bleeding angioectasia in the stomach treated with bipolar cautery. Gastroesophageal junction and esophagus were normal. No specimens were collected.  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  Colonoscopyon 03/25/2018 revealed a single 5 mm sessile polyp in the ascending colon. Patient has rectal varices and external hemorrhoids. Pathology returned as tubular adenoma, and was negative for high grade dysplasia and malignancy.   She has iron deficiency anemia. Ferritinwas 6. She is on ferrous sulfate 325 mg TID. She hasB12 deficiency. B12was 103. Intrinsic factor antibody was negative. 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 She began B12 injections on 03/06/2018 (last 02/01/2021). Folate was19.2on06/06/2020. Dietis good.  She received Venoferweekly x 4 (07/12/2018 - 07/31/2018), x 3 (12/22/2019 -01/05/2020), x 3 (03/30/2020  - 04/13/2020), 07/08/2020, 07/21/2020, x 3 (08/05/2020 - 08/19/2020), 01/04/2021, and 01/12/2021.  Ferritinhas been followed: 6 on 02/09/2018, 16 on 05/24/2018, 13 on 07/10/2018, 374 on 08/15/2018, 485 on 08/21/2018, 114 on 09/25/2018, 63 on 11/20/2018, 35 on 02/12/2019, 22 on 04/24/2019, 41 on 09/22/2019, 41 on 10/27/2019, 21 on 12/15/2019, 22 on 03/22/2020, 20 on 03/26/2020, 271 on 04/14/2020, 57 on 05/24/2020, 23 on 06/30/2020, 26 on 08/05/2020, 74 on 09/14/2020, 42 on 11/09/2020, 28 on 01/03/2021, 92 on 02/01/2021, and 53 on 03/01/2021.  She has hepatitis C antibodies. HCV RNA was not detected. She has been exposed to hepatitis B. Anti-smooth muscle antibodiesare weakly positive. ANAwas positive (>1:1280 centromere antibody). Negative studiesincluded: ceruloplasmin, anti-mitochondrial antibodies, hepatitis B IgM, HIV, and alpha-1 antitrypsin. PT was 15.2 (INR 1.21). AFPwas 1.5 on 05/28/2018,2.4 on 11/20/2018, 2.0 on 04/24/2019, and 1.8 on 09/14/2020.  She has a history of decompensated cirrhosiss/p TIPSprocedure 08/07/2018. She has portal hypertension and a history of recurrent bleeding esophageal varices.She was given a 2-year life expectancyfollowing her TIPS procedure. Abdominal/pelvic arterial/venous ultrasound doppleron 02/09/2021 revealed patent TIPS, with the flow velocities essentially unchanged from the comparison duplex studies. Remainder of the exam was somewhat limited for complete evaluation of the hepatic vasculature given the reduced sonographic window.  AFP was 1.8 on 09/14/2020.  [facility] with[facility].HeadCT revealedno acute intracranial pathology. Head MRIwo contrast showed symmetric FLAIR hyperintensity within the bilateral mamillary bodies. 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  The patient received the [place] COVID-19 vaccine on 12/26/2019 and 01/21/2020.  Symptomatically, she notes a lot of extra fluid. She reports leg and abdominal swelling and shortness of breath on exertion. She can barely walk  due to the swelling. She denies any bleeding.  Exam reveals flank and lower extremity edema.  Plan: 1.   Review PET scan 2.   Decompensated cirrhosis s/p TIPS Clinically,she is retaining more fluid. Life expectancy is limited s/pTIPS on 08/07/2018 (2 years). AFPwas 1.8 on 09/14/2020. Discuss plan to continue [facility] surveillance every 6 months with AFP and liver imaging (last 01/20/2021).   3.Pulmonary nodules Chest CTon 02/02/2020 revealed stable nodules within the right middle and right lower lobes.  Chest CT on 02/01/2021 revealed an increase nodule along the minor fissure in the right chest (elongated).    RLL nodule measuring 2.4 x 1.6 cm.   PET scan on 02/23/2021 was personally reviewed.  Agree with radiology interpretation.   The slowly enlarging right lung masses demonstrated only low level hypermetabolic activity, similar to remote PET-CT from 3 years ago.     Continued CT follow-up was recommended.    There was low-level hypermetabolic activity in the left nasopharynx without associated focal lesion on CT imaging.     Patient declines ENT evaluation.   There were stable chronic findings of cirrhosis and portal hypertension post TIPS.  No concerning pulmonary lesions.  4.Iron deficiency anemia Hematocrit 29.7. Hemoglobin 9.7.  MCV 79.0 on 03/01/2021.  Ferritin 53 with an iron saturation of 51% and a TIBC of 174. She denies any bleeding. 5.B12 deficiency Patient receives B12 monthly (last03/22/2022) Continue monthly B12 injections. 6.   HFpEF/cirrhosis  Weight increased with fluid retention.  Discussed with cardiology.  Lasix 80 mg IV today. 7.   B12 today  and monthly x 6. 8.   RTC in 2 months for MD assessment, labs (CBC, ferritin, iron studies) and B12.  I discussed the assessment and treatment plan with the patient.  The patient was provided an opportunity to ask questions and all were answered.  The patient agreed with the plan and demonstrated an understanding of the instructions.  The patient was advised to call back if the symptoms worsen or if the condition fails to improve as anticipated.  I provided 18 minutes of face-to-face time during this this encounter and > 50% was spent counseling as documented under my assessment and plan. An additional 5 minutes were spent reviewing her chart (Epic and Care Everywhere) including notes, labs, and imaging studies.    Lequita Asal, MD, PhD 03/01/2021, 2:36 PM   I, De Burrs, am acting as scribe for [employer]. Mike Gip, MD, PhD.  I, Lesley Atkin C. Mike Gip, MD, have reviewed the above documentation for accuracy and completeness, and I agree with the above.

## 2021-02-25 ENCOUNTER — Telehealth (HOSPITAL_COMMUNITY): Payer: Self-pay

## 2021-02-25 NOTE — Telephone Encounter (Signed)
Called to schedule TIPS study, no answer, left vm. AW

## 2021-02-28 NOTE — Telephone Encounter (Signed)
Patient verbalized understanding

## 2021-02-28 NOTE — Telephone Encounter (Signed)
Called patient last week and left voicemail regarding the safety of the procedure.  Caryl Pina, please call her again when you get a chance.  Please let her know that I wanted her to go ahead with the procedure  Thanks RV

## 2021-03-01 ENCOUNTER — Inpatient Hospital Stay (HOSPITAL_BASED_OUTPATIENT_CLINIC_OR_DEPARTMENT_OTHER): Payer: Medicare HMO | Admitting: Hematology and Oncology

## 2021-03-01 ENCOUNTER — Ambulatory Visit: Payer: Medicare HMO

## 2021-03-01 ENCOUNTER — Other Ambulatory Visit: Payer: Self-pay

## 2021-03-01 ENCOUNTER — Inpatient Hospital Stay: Payer: Medicare HMO

## 2021-03-01 ENCOUNTER — Encounter: Payer: Self-pay | Admitting: Hematology and Oncology

## 2021-03-01 VITALS — BP 117/74

## 2021-03-01 VITALS — BP 130/59 | HR 59 | Temp 97.4°F | Resp 18 | Wt 206.0 lb

## 2021-03-01 DIAGNOSIS — D500 Iron deficiency anemia secondary to blood loss (chronic): Secondary | ICD-10-CM | POA: Diagnosis not present

## 2021-03-01 DIAGNOSIS — D509 Iron deficiency anemia, unspecified: Secondary | ICD-10-CM | POA: Diagnosis present

## 2021-03-01 DIAGNOSIS — R918 Other nonspecific abnormal finding of lung field: Secondary | ICD-10-CM

## 2021-03-01 DIAGNOSIS — E538 Deficiency of other specified B group vitamins: Secondary | ICD-10-CM

## 2021-03-01 DIAGNOSIS — I5033 Acute on chronic diastolic (congestive) heart failure: Secondary | ICD-10-CM

## 2021-03-01 DIAGNOSIS — K922 Gastrointestinal hemorrhage, unspecified: Secondary | ICD-10-CM

## 2021-03-01 LAB — CBC WITH DIFFERENTIAL/PLATELET
Abs Immature Granulocytes: 0.02 10*3/uL (ref 0.00–0.07)
Basophils Absolute: 0 10*3/uL (ref 0.0–0.1)
Basophils Relative: 1 %
Eosinophils Absolute: 0.7 10*3/uL — ABNORMAL HIGH (ref 0.0–0.5)
Eosinophils Relative: 12 %
HCT: 29.7 % — ABNORMAL LOW (ref 36.0–46.0)
Hemoglobin: 9.7 g/dL — ABNORMAL LOW (ref 12.0–15.0)
Immature Granulocytes: 0 %
Lymphocytes Relative: 20 %
Lymphs Abs: 1.1 10*3/uL (ref 0.7–4.0)
MCH: 25.8 pg — ABNORMAL LOW (ref 26.0–34.0)
MCHC: 32.7 g/dL (ref 30.0–36.0)
MCV: 79 fL — ABNORMAL LOW (ref 80.0–100.0)
Monocytes Absolute: 0.5 10*3/uL (ref 0.1–1.0)
Monocytes Relative: 9 %
Neutro Abs: 3.2 10*3/uL (ref 1.7–7.7)
Neutrophils Relative %: 58 %
Platelets: 144 10*3/uL — ABNORMAL LOW (ref 150–400)
RBC: 3.76 MIL/uL — ABNORMAL LOW (ref 3.87–5.11)
RDW: 16.5 % — ABNORMAL HIGH (ref 11.5–15.5)
WBC: 5.6 10*3/uL (ref 4.0–10.5)
nRBC: 0 % (ref 0.0–0.2)

## 2021-03-01 LAB — BASIC METABOLIC PANEL
Anion gap: 6 (ref 5–15)
BUN: 26 mg/dL — ABNORMAL HIGH (ref 8–23)
CO2: 23 mmol/L (ref 22–32)
Calcium: 8.6 mg/dL — ABNORMAL LOW (ref 8.9–10.3)
Chloride: 101 mmol/L (ref 98–111)
Creatinine, Ser: 1.89 mg/dL — ABNORMAL HIGH (ref 0.44–1.00)
GFR, Estimated: 29 mL/min — ABNORMAL LOW (ref >60)
Glucose, Bld: 136 mg/dL — ABNORMAL HIGH (ref 70–99)
Potassium: 3.9 mmol/L (ref 3.5–5.1)
Sodium: 130 mmol/L — ABNORMAL LOW (ref 135–145)

## 2021-03-01 LAB — IRON AND TIBC
Iron: 89 ug/dL (ref 28–170)
Saturation Ratios: 51 % — ABNORMAL HIGH (ref 10.4–31.8)
TIBC: 174 ug/dL — ABNORMAL LOW (ref 250–450)
UIBC: 85 ug/dL

## 2021-03-01 LAB — FERRITIN: Ferritin: 53 ng/mL (ref 11–307)

## 2021-03-01 MED ORDER — HEPARIN SOD (PORK) LOCK FLUSH 100 UNIT/ML IV SOLN
500.0000 [IU] | Freq: Once | INTRAVENOUS | Status: AC
  Administered 2021-03-01: 500 [IU] via INTRAVENOUS
  Filled 2021-03-01: qty 5

## 2021-03-01 MED ORDER — CYANOCOBALAMIN 1000 MCG/ML IJ SOLN
1000.0000 ug | Freq: Once | INTRAMUSCULAR | Status: AC
  Administered 2021-03-01: 1000 ug via INTRAMUSCULAR
  Filled 2021-03-01: qty 1

## 2021-03-01 MED ORDER — FUROSEMIDE 10 MG/ML IJ SOLN
80.0000 mg | Freq: Once | INTRAMUSCULAR | Status: AC
  Administered 2021-03-01: 80 mg via INTRAMUSCULAR
  Filled 2021-03-01: qty 8

## 2021-03-02 ENCOUNTER — Telehealth (HOSPITAL_COMMUNITY): Payer: Self-pay

## 2021-03-02 NOTE — Telephone Encounter (Signed)
-----  Message from Corrie Mckusick, DO sent at 02/25/2021 12:07 PM EDT ----- Regarding: RE: Schedule TIPS Access with VEnogram and Pressure Measurement OK that is fine, thank you.  JW  ----- Message ----- From: Danielle Dess Sent: 02/25/2021  11:51 AM EDT To: Corrie Mckusick, DO Subject: FW: Schedule TIPS Access with VEnogram and P#  [name],   Just wanted to make you aware that I have booked her for Monday 4/25. 9am, moderate sedation, 1-hour. Let me know if this won't work.   Thanks,  Lia Foyer  ----- Message ----- From: Marcelyn Bruins Sent: 02/10/2021   9:11 AM EDT To: [name] Subject: Schedule TIPS Access with VEnogram and Press#  Interventional Radiology Procedure Request  Name: Crystal [name] [birth year]  Procedure: TIPS Shunt Study (Trans-jugular Access with Venogram and Pressure Measurement)  Reason for procedure: GI Bleed  Relevant History:In epic  Order Physician: [name]  Office Contact: [name] Thank you

## 2021-03-03 ENCOUNTER — Ambulatory Visit: Payer: Medicare HMO

## 2021-03-03 ENCOUNTER — Other Ambulatory Visit: Payer: Medicare HMO

## 2021-03-03 ENCOUNTER — Other Ambulatory Visit: Payer: Self-pay | Admitting: Radiology

## 2021-03-07 ENCOUNTER — Ambulatory Visit (HOSPITAL_COMMUNITY)
Admission: RE | Admit: 2021-03-07 | Discharge: 2021-03-07 | Disposition: A | Discharge status: Home / Self Care | Payer: Medicare HMO | Source: Ambulatory Visit | Attending: Interventional Radiology | Admitting: Interventional Radiology

## 2021-03-07 ENCOUNTER — Encounter (HOSPITAL_COMMUNITY): Payer: Self-pay

## 2021-03-07 ENCOUNTER — Other Ambulatory Visit (HOSPITAL_COMMUNITY): Payer: Self-pay | Admitting: Interventional Radiology

## 2021-03-07 ENCOUNTER — Other Ambulatory Visit: Payer: Self-pay

## 2021-03-07 ENCOUNTER — Other Ambulatory Visit: Payer: Self-pay | Admitting: Internal Medicine

## 2021-03-07 ENCOUNTER — Other Ambulatory Visit: Payer: Self-pay | Admitting: Gastroenterology

## 2021-03-07 DIAGNOSIS — K7460 Unspecified cirrhosis of liver: Secondary | ICD-10-CM | POA: Diagnosis not present

## 2021-03-07 DIAGNOSIS — R2243 Localized swelling, mass and lump, lower limb, bilateral: Secondary | ICD-10-CM | POA: Diagnosis not present

## 2021-03-07 DIAGNOSIS — Z79899 Other long term (current) drug therapy: Secondary | ICD-10-CM | POA: Insufficient documentation

## 2021-03-07 DIAGNOSIS — R609 Edema, unspecified: Secondary | ICD-10-CM

## 2021-03-07 DIAGNOSIS — R635 Abnormal weight gain: Secondary | ICD-10-CM

## 2021-03-07 DIAGNOSIS — M25475 Effusion, left foot: Secondary | ICD-10-CM

## 2021-03-07 DIAGNOSIS — K922 Gastrointestinal hemorrhage, unspecified: Secondary | ICD-10-CM | POA: Insufficient documentation

## 2021-03-07 DIAGNOSIS — M25474 Effusion, right foot: Secondary | ICD-10-CM

## 2021-03-07 DIAGNOSIS — Z9689 Presence of other specified functional implants: Secondary | ICD-10-CM | POA: Diagnosis not present

## 2021-03-07 DIAGNOSIS — K766 Portal hypertension: Secondary | ICD-10-CM | POA: Insufficient documentation

## 2021-03-07 DIAGNOSIS — M25471 Effusion, right ankle: Secondary | ICD-10-CM

## 2021-03-07 HISTORY — PX: IR VENO/JUGULAR LEFT: IMG2274

## 2021-03-07 HISTORY — PX: IR US GUIDE VASC ACCESS RIGHT: IMG2390

## 2021-03-07 HISTORY — PX: IR TRANSHEPATIC PORTOGRAM W HEMO: IMG690

## 2021-03-07 LAB — CBC
HCT: 34.2 % — ABNORMAL LOW (ref 36.0–46.0)
Hemoglobin: 10.7 g/dL — ABNORMAL LOW (ref 12.0–15.0)
MCH: 25.8 pg — ABNORMAL LOW (ref 26.0–34.0)
MCHC: 31.3 g/dL (ref 30.0–36.0)
MCV: 82.4 fL (ref 80.0–100.0)
Platelets: 109 10*3/uL — ABNORMAL LOW (ref 150–400)
RBC: 4.15 MIL/uL (ref 3.87–5.11)
RDW: 16.6 % — ABNORMAL HIGH (ref 11.5–15.5)
WBC: 4.5 10*3/uL (ref 4.0–10.5)
nRBC: 0 % (ref 0.0–0.2)

## 2021-03-07 LAB — BASIC METABOLIC PANEL
Anion gap: 9 (ref 5–15)
BUN: 23 mg/dL (ref 8–23)
CO2: 25 mmol/L (ref 22–32)
Calcium: 8.7 mg/dL — ABNORMAL LOW (ref 8.9–10.3)
Chloride: 101 mmol/L (ref 98–111)
Creatinine, Ser: 2.42 mg/dL — ABNORMAL HIGH (ref 0.44–1.00)
GFR, Estimated: 21 mL/min — ABNORMAL LOW (ref >60)
Glucose, Bld: 127 mg/dL — ABNORMAL HIGH (ref 70–99)
Potassium: 5 mmol/L (ref 3.5–5.1)
Sodium: 135 mmol/L (ref 135–145)

## 2021-03-07 LAB — POCT I-STAT, CHEM 8
BUN: 29 mg/dL — ABNORMAL HIGH (ref 8–23)
Calcium, Ion: 1 mmol/L — ABNORMAL LOW (ref 1.15–1.40)
Chloride: 104 mmol/L (ref 98–111)
Creatinine, Ser: 2.3 mg/dL — ABNORMAL HIGH (ref 0.44–1.00)
Glucose, Bld: 213 mg/dL — ABNORMAL HIGH (ref 70–99)
HCT: 33 % — ABNORMAL LOW (ref 36.0–46.0)
Hemoglobin: 11.2 g/dL — ABNORMAL LOW (ref 12.0–15.0)
Potassium: 4.2 mmol/L (ref 3.5–5.1)
Sodium: 134 mmol/L — ABNORMAL LOW (ref 135–145)
TCO2: 23 mmol/L (ref 22–32)

## 2021-03-07 LAB — GLUCOSE, CAPILLARY
Glucose-Capillary: 83 mg/dL (ref 70–99)
Glucose-Capillary: 39 mg/dL — CL (ref 70–99)
Glucose-Capillary: 47 mg/dL — ABNORMAL LOW (ref 70–99)
Glucose-Capillary: 42 mg/dL — CL (ref 70–99)

## 2021-03-07 LAB — PROTIME-INR
INR: 1.3 — ABNORMAL HIGH (ref 0.8–1.2)
Prothrombin Time: 15.9 seconds — ABNORMAL HIGH (ref 11.4–15.2)

## 2021-03-07 MED ORDER — LIDOCAINE HCL 1 % IJ SOLN
INTRAMUSCULAR | Status: AC
  Filled 2021-03-07: qty 20

## 2021-03-07 MED ORDER — MIDAZOLAM HCL 2 MG/2ML IJ SOLN
INTRAMUSCULAR | Status: AC
  Filled 2021-03-07: qty 2

## 2021-03-07 MED ORDER — FENTANYL CITRATE (PF) 100 MCG/2ML IJ SOLN
INTRAMUSCULAR | Status: AC
  Filled 2021-03-07: qty 2

## 2021-03-07 MED ORDER — SODIUM CHLORIDE 0.9 % IV SOLN: INTRAVENOUS | Status: DC

## 2021-03-07 MED ORDER — FENTANYL CITRATE (PF) 100 MCG/2ML IJ SOLN
INTRAMUSCULAR | Status: AC | PRN
  Administered 2021-03-07 (×2): 25 ug via INTRAVENOUS

## 2021-03-07 MED ORDER — MIDAZOLAM HCL 2 MG/2ML IJ SOLN
INTRAMUSCULAR | Status: AC | PRN
  Administered 2021-03-07: 1 mg via INTRAVENOUS
  Administered 2021-03-07: 0.5 mg via INTRAVENOUS

## 2021-03-07 NOTE — Discharge Instructions (Addendum)
Venogram A venogram, or venography, is a procedure that uses an X-ray and dye (contrast) to examine how well the veins work and how blood flows through them. Contrast helps the veins show up on X-rays. A venogram may be done:  To evaluate abnormalities in the vein.  To identify clots within veins, such as deep vein thrombosis (DVT).  To map out the veins that might be needed for another procedure. Tell a health care provider about:  Any allergies you have, especially to medicines, shellfish, iodine, and contrast.  All medicines you are taking, including vitamins, herbs, eye drops, creams, and over-the-counter medicines.  Any problems you or family members have had with anesthetic medicines.  Any blood disorders you have.  Any surgeries you have had and any complications that occurred.  Any medical conditions you have.  Whether you are pregnant, may be pregnant, or are breastfeeding.  Any history of smoking or tobacco use. What are the risks? Generally, this is a safe procedure. However, problems may occur, including:  Infection.  Bleeding.  Blood clots.  Allergic reaction to medicines or contrast.  Damage to other structures or organs.  Kidney problems.  Increased risk of cancer. Being exposed to too much radiation over a lifetime can increase the risk of cancer. The risk is small. What happens before the procedure? Medicines Ask your health care provider about:  Changing or stopping your regular medicines. This is especially important if you are taking diabetes medicines or blood thinners.  Taking medicines such as aspirin and ibuprofen. These medicines can thin your blood. Do not take these medicines unless your health care provider tells you to take them.  Taking over-the-counter medicines, vitamins, herbs, and supplements. General instructions  Follow instructions from your health care provider about eating or drinking restrictions.  You may have blood tests  to check how well your kidneys and liver are working and how well your blood can clot.  Plan to have someone take you home from the hospital or clinic. What happens during the procedure?  An IV will be inserted into one of your veins.  You may be given a medicine to help you relax (sedative).  You will lie down on an X-ray table. The table may be tilted in different directions during the procedure to help the contrast move throughout your body. Safety straps will keep you secure if the table is tilted.  If veins in your arm or leg will be examined, a band may be wrapped around that arm or leg to keep the veins full of blood. This may cause your arm or leg to feel numb.  The contrast will be injected into your IV. You may have a hot, flushed feeling as it moves throughout your body. You may also have a metallic taste in your mouth. Both of these sensations will go away after the test is complete.  You may be asked to lie in different positions or place your legs or arms in different positions.  At the end of the procedure, you may be given IV fluids to help wash or flush the contrast out of your veins.  The IV will be removed, and pressure will be applied to the IV site to prevent bleeding. A bandage (dressing) may be applied to the IV site. The exact procedure may vary among health care providers and hospitals.   What can I expect after the procedure?  Your blood pressure, heart rate, breathing rate, and blood oxygen level will be monitored until  you leave the hospital or clinic.  You may be given something to eat and drink.  You may have bruising or mild discomfort in the area where the IV was inserted. Follow these instructions at home: Eating and drinking  Follow instructions from your health care provider about eating or drinking restrictions.  Drink a lot of water for the first several days after the procedure, as directed by your health care provider. This helps to flush the  contrast out of your body.   Activity  Rest as told by your health care provider.  Return to your normal activities as told by your health care provider. Ask your health care provider what activities are safe for you.  If you were given a sedative during your procedure, do not drive for 24 hours or until your health care provider approves. General instructions  Check your IV insertion area every day for signs of infection. Check for: ? Redness, swelling, or pain. ? Fluid or blood. ? Warmth. ? Pus or a bad smell.  Take over-the-counter and prescription medicines only as told by your health care provider.  Keep all follow-up visits as told by your health care provider. This is important. Contact a health care provider if:  Your skin becomes itchy or you develop a rash or hives.  You have a fever that does not get better with medicine.  You feel nauseous or you vomit.  You have redness, swelling, or pain around the insertion site.  You have fluid or blood coming from the insertion site.  Your insertion area feels warm to the touch.  You have pus or a bad smell coming from the insertion site. Get help right away if you:  Have shortness of breath or difficulty breathing.  Develop chest pain.  Faint.  Feel very dizzy. These symptoms may represent a serious problem that is an emergency. Do not wait to see if the symptoms will go away. Get medical help right away. Call your local emergency services (911 in the [place] Do not drive yourself to the hospital. Summary  A venogram, or venography, is a procedure that uses an X-ray and contrast dye to check how well the veins work and how blood flows through them.  An IV will be inserted into one of your veins in order to inject the contrast.  During the exam, you will lie on an X-ray table. The table may be tilted in different directions during the procedure to help the contrast move throughout your body. Safety straps will keep  you secure.  After the procedure, you will need to drink a lot of water to help wash or flush the contrast out of your body. This information is not intended to replace advice given to you by your health care provider. Make sure you discuss any questions you have with your health care provider. Document Revised: 06/07/2019 Document Reviewed: 06/07/2019 Elsevier Patient Education  2021 [place].   Moderate Conscious Sedation, Adult, Care After This sheet gives you information about how to care for yourself after your procedure. Your health care provider may also give you more specific instructions. If you have problems or questions, contact your health care provider. What can I expect after the procedure? After the procedure, it is common to have:  Sleepiness for several hours.  Impaired judgment for several hours.  Difficulty with balance.  Vomiting if you eat too soon. Follow these instructions at home: For the time period you were told by your health care  provider:  Rest.  Do not participate in activities where you could fall or become injured.  Do not drive or use machinery.  Do not drink alcohol.  Do not take sleeping pills or medicines that cause drowsiness.  Do not make important decisions or sign legal documents.  Do not take care of children on your own.      Eating and drinking  Follow the diet recommended by your health care provider.  Drink enough fluid to keep your urine pale yellow.  If you vomit: ? Drink water, juice, or soup when you can drink without vomiting. ? Make sure you have little or no nausea before eating solid foods.   General instructions  Take over-the-counter and prescription medicines only as told by your health care provider.  Have a responsible adult stay with you for the time you are told. It is important to have someone help care for you until you are awake and alert.  Do not smoke.  Keep all follow-up visits as told by  your health care provider. This is important. Contact a health care provider if:  You are still sleepy or having trouble with balance after 24 hours.  You feel light-headed.  You keep feeling nauseous or you keep vomiting.  You develop a rash.  You have a fever.  You have redness or swelling around the IV site. Get help right away if:  You have trouble breathing.  You have new-onset confusion at home. Summary  After the procedure, it is common to feel sleepy, have impaired judgment, or feel nauseous if you eat too soon.  Rest after you get home. Know the things you should not do after the procedure.  Follow the diet recommended by your health care provider and drink enough fluid to keep your urine pale yellow.  Get help right away if you have trouble breathing or new-onset confusion at home. This information is not intended to replace advice given to you by your health care provider. Make sure you discuss any questions you have with your health care provider. Document Revised: 02/27/2020 Document Reviewed: 09/25/2019 Elsevier Patient Education  2021 [employer].

## 2021-03-07 NOTE — H&P (Signed)
Chief Complaint: Patient was seen in consultation today for TIPs evaluation and possible angioplasty/stent placement at the request of Dr Marius Ditch    Supervising Physician: Corrie Mckusick  Patient Status: [facility] - Out-pt  History of Present Illness: Crystal [name] is a 70 y.o. female   She is a very pleasant lady with history of cirrhosis and HCV, SP TIPS performed emergently as inpt, 08/07/2018. The indication was recurrent EV bleeding, refractory to banding.  Denies recurrent bleeding Does complain of Bilat leg swelling; and now abd swelling and tightness Also complains of itchiness all over.  Echo 12/2020: Left Ventricle: Left ventricular ejection fraction, by estimation, is 55 to 60%. The left ventricle has normal function. The left ventricle has no regional wall motion abnormalities. The left ventricular internal cavity size was moderately dilated. There is mild left ventricular hypertrophy. Left ventricular diastolic parameters are consistent with Grade I diastolic dysfunction (impaired relaxation). Elevated left atrial pressure. Right Ventricle: The right ventricular size is normal. No increase in right ventricular wall thickness. Right ventricular systolic function is normal. Tricuspid regurgitation signal is inadequate for assessing PA pressure.  Was seen in consultation 01/3021 with Dr [name] She is scheduled now for TIPS evaluation with pressure measurements Possible angioplasty/stent placement  GRR 21 today Cr 2.42 today   Past Medical History:  Diagnosis Date  . Acute GI bleeding 02/09/2018  . Acute upper gastrointestinal bleeding 08/03/2018  . Anemia    TAKES IRON TAB  . Arthritis    SHOULDER  . B12 deficiency 02/18/2018  . Bleeding    GI  3/19  . Bronchitis    HX OF  . CHF (congestive heart failure) ([place])   . Cirrhosis ([place])   . Colon cancer screening   . Diabetes mellitus without complication ([place])    TYPE 2  . Fatty (change of) liver, not elsewhere classified    . Full dentures    UPPER AND LOWER  . Iron deficiency anemia due to chronic blood loss 02/18/2018  . Lung nodule, multiple   . Pernicious anemia 02/22/2018  . PUD (peptic ulcer disease)   . Raynaud's syndrome   . Upper GI bleeding 08/03/2018    Past Surgical History:  Procedure Laterality Date  . CATARACT EXTRACTION W/PHACO Right 02/06/2018   Procedure: CATARACT EXTRACTION PHACO AND INTRAOCULAR LENS PLACEMENT ([place]) RIGHT DIABETIC;  Surgeon: Leandrew Koyanagi, MD;  Location: [place];  Service: Ophthalmology;  Laterality: Right;  DIABETIC, ORAL MED  . CATARACT EXTRACTION W/PHACO Left 04/30/2018   Procedure: CATARACT EXTRACTION PHACO AND INTRAOCULAR LENS PLACEMENT (IOC);  Surgeon: Leandrew Koyanagi, MD;  Location: [facility] ORS;  Service: Ophthalmology;  Laterality: Left;  [place] 01:04 AP% 21.3 CDE 13.66 Fluid pack lot # [id number]  . COLONOSCOPY WITH PROPOFOL N/A 03/25/2018   Procedure: COLONOSCOPY WITH PROPOFOL;  Surgeon: Lin Landsman, MD;  Location: [facility] ENDOSCOPY;  Service: Gastroenterology;  Laterality: N/A;  . DENTAL SURGERY     EXTRACTIONS  . ELECTROMAGNETIC NAVIGATION BROCHOSCOPY Right 01/27/2019   Procedure: ELECTROMAGNETIC NAVIGATION BRONCHOSCOPY;  Surgeon: [name], MD;  Location: [facility];  Service: Cardiopulmonary;  Laterality: Right;  . ENTEROSCOPY N/A 07/28/2020   Procedure: Push ENTEROSCOPY;  Surgeon: Lin Landsman, MD;  Location: [facility] ENDOSCOPY;  Service: Gastroenterology;  Laterality: N/A;  . ESOPHAGOGASTRODUODENOSCOPY N/A 08/03/2018   Procedure: ESOPHAGOGASTRODUODENOSCOPY (EGD);  Surgeon: Lin Landsman, MD;  Location: [facility] ENDOSCOPY;  Service: Gastroenterology;  Laterality: N/A;  . ESOPHAGOGASTRODUODENOSCOPY (EGD) WITH PROPOFOL N/A 02/10/2018   Procedure: ESOPHAGOGASTRODUODENOSCOPY (EGD) WITH PROPOFOL;  Surgeon: Jonathon Bellows, MD;  Location: [facility] ENDOSCOPY;  Service: Gastroenterology;  Laterality: N/A;  . ESOPHAGOGASTRODUODENOSCOPY (EGD) WITH PROPOFOL  N/A 03/25/2018   Procedure: ESOPHAGOGASTRODUODENOSCOPY (EGD) WITH PROPOFOL;  Surgeon: Lin Landsman, MD;  Location: [facility] ENDOSCOPY;  Service: Gastroenterology;  Laterality: N/A;  . ESOPHAGOGASTRODUODENOSCOPY (EGD) WITH PROPOFOL N/A 04/22/2018   Procedure: ESOPHAGOGASTRODUODENOSCOPY (EGD) WITH PROPOFOL with band ligation;  Surgeon: Lin Landsman, MD;  Location: [place];  Service: Gastroenterology;  Laterality: N/A;  . ESOPHAGOGASTRODUODENOSCOPY (EGD) WITH PROPOFOL N/A 06/24/2018   Procedure: ESOPHAGOGASTRODUODENOSCOPY (EGD) WITH PROPOFOL;  Surgeon: Lin Landsman, MD;  Location: [facility] ENDOSCOPY;  Service: Gastroenterology;  Laterality: N/A;  . ESOPHAGOGASTRODUODENOSCOPY (EGD) WITH PROPOFOL N/A 11/19/2018   Procedure: ESOPHAGOGASTRODUODENOSCOPY (EGD) WITH PROPOFOL;  Surgeon: Lin Landsman, MD;  Location: [facility] ENDOSCOPY;  Service: Gastroenterology;  Laterality: N/A;  . ESOPHAGOGASTRODUODENOSCOPY (EGD) WITH PROPOFOL N/A 03/08/2020   Procedure: ESOPHAGOGASTRODUODENOSCOPY (EGD) WITH PROPOFOL;  Surgeon: Lin Landsman, MD;  Location: [facility] ENDOSCOPY;  Service: Gastroenterology;  Laterality: N/A;  . EYE SURGERY    . GIVENS CAPSULE STUDY N/A 03/29/2020   Procedure: GIVENS CAPSULE STUDY;  Surgeon: Lin Landsman, MD;  Location: [facility] ENDOSCOPY;  Service: Gastroenterology;  Laterality: N/A;  . HEMORRHOID BANDING  03/25/2018   Procedure: HEMORRHOID BANDING;  Surgeon: Lin Landsman, MD;  Location: [facility] ENDOSCOPY;  Service: Gastroenterology;;  . IR EMBO ART  VEN HEMORR LYMPH EXTRAV  INC GUIDE ROADMAPPING  08/07/2018  . IR RADIOLOGIST EVAL & MGMT  09/03/2018  . IR RADIOLOGIST EVAL & MGMT  12/17/2018  . IR RADIOLOGIST EVAL & MGMT  06/24/2019  . IR RADIOLOGIST EVAL & MGMT  01/22/2020  . IR RADIOLOGIST EVAL & MGMT  02/09/2021  . IR TIPS  08/07/2018  . PORTA CATH INSERTION N/A 07/30/2020   Procedure: PORTA CATH INSERTION;  Surgeon: Katha Cabal, MD;  Location: [place] CV  LAB;  Service: Cardiovascular;  Laterality: N/A;  . RADIOLOGY WITH ANESTHESIA N/A 08/07/2018   Procedure: TIPS;  Surgeon: Corrie Mckusick, DO;  Location: [place];  Service: Anesthesiology;  Laterality: N/A;  . TONSILLECTOMY      Allergies: Patient has no known allergies.  Medications: Prior to Admission medications   Medication Sig Start Date End Date Taking? Authorizing Provider  aspirin-sod bicarb-citric acid (ALKA-SELTZER) 325 MG TBEF tablet Take 650 mg by mouth every 6 (six) hours as needed (indigestion).   Yes [provider]  cyanocobalamin (,VITAMIN B-12,) 1000 MCG/ML injection Inject 1,000 mcg into the muscle every 30 (thirty) days.    Yes [provider]  hydrOXYzine (ATARAX/VISTARIL) 25 MG tablet Take 25 mg by mouth 2 (two) times daily. 11/21/20  Yes [provider]  KLOR-CON M10 10 MEQ tablet TAKE 10 MG (1 TABLET DAILY) DAILY . ALTERNATING DAYS TAKE 15 MG (1 AND 1/2 TABLET) DAILY Patient taking differently: Take 15 mEq by mouth daily. 01/21/21  Yes Loel Dubonnet, NP  pantoprazole (PROTONIX) 40 MG tablet Take 40 mg by mouth 2 (two) times daily. 06/29/19  Yes [provider]  sodium bicarbonate 650 MG tablet Take 1,300 mg by mouth 2 (two) times daily. 02/23/21  Yes [provider]  spironolactone (ALDACTONE) 100 MG tablet TAKE 1 TABLET BY MOUTH TWICE A DAY Patient taking differently: Take 100 mg by mouth daily. 01/24/21  Yes Vanga, Tally Due, MD  Tetrahydrozoline HCl (VISINE OP) Place 1 drop into both eyes daily as needed (irritaiton).   Yes [provider]  torsemide (DEMADEX) 20 MG tablet Take  2 tablets (40 mg total) by mouth 2 (two) times daily. 02/14/21  Yes Loel Dubonnet, NP  Vitamin D, Ergocalciferol, (DRISDOL) 1.25 MG (50000 UNIT) CAPS capsule Take 50,000 Units by mouth every Sunday. 01/14/21  Yes [provider]  XIFAXAN 550 MG TABS tablet Take 550 mg by mouth 2 (two) times daily. 06/24/20  Yes [provider]   acetaminophen (TYLENOL) 500 MG tablet Take 500 mg by mouth daily as needed for moderate pain or headache.    [provider]  lactulose (CHRONULAC) 10 GM/15ML solution TAKE 15 MLS (10 G TOTAL) BY MOUTH 3 (THREE) TIMES DAILY. 03/07/21   Lin Landsman, MD  Misc. Devices KIT Moderate compression hose 20-30 MM HG 10/14/19   Vanga, Tally Due, MD  ondansetron (ZOFRAN) 4 MG tablet Take 4 mg by mouth every 8 (eight) hours as needed for nausea or vomiting.     [provider]  OneTouch Delica Lancets 98P MISC 2 (two) times daily. 12/01/19   [provider]  [facility] ULTRA test strip 2 (two) times daily. 11/23/19   [provider]     Family History  Problem Relation Age of Onset  . CAD Father   . Heart attack Father 37  . Cancer Maternal Uncle   . Seizures Mother     Social History   Socioeconomic History  . Marital status: Single    Spouse name: Not on file  . Number of children: Not on file  . Years of education: Not on file  . Highest education level: Not on file  Occupational History  . Not on file  Tobacco Use  . Smoking status: Never Smoker  . Smokeless tobacco: Never Used  Vaping Use  . Vaping Use: Never used  Substance and Sexual Activity  . Alcohol use: Not Currently    Comment: No EtOH for 30 years.  Never a heavy drinker.  . Drug use: Never  . Sexual activity: Not on file  Other Topics Concern  . Not on file  Social History Narrative   Independent at baseline. Lives at home with family   Social Determinants of Health   Financial Resource Strain: Not on file  Food Insecurity: Not on file  Transportation Needs: Not on file  Physical Activity: Not on file  Stress: Not on file  Social Connections: Not on file    Review of Systems: A 12 point ROS discussed and pertinent positives are indicated in the HPI above.  All other systems are negative.  Review of Systems  Constitutional: Positive for activity change, fatigue and  unexpected weight change. Negative for fever.  Respiratory: Positive for shortness of breath. Negative for cough.   Cardiovascular: Negative for chest pain.  Gastrointestinal: Positive for abdominal distention and nausea. Negative for abdominal pain.  Musculoskeletal: Positive for gait problem.  Neurological: Positive for weakness.  Psychiatric/Behavioral: Negative for behavioral problems and confusion.    Vital Signs: BP 131/67   Pulse 66   Temp 98 F (36.7 C) (Oral)   Ht 5' 2" (1.575 m)   Wt 206 lb (93.4 kg)   LMP  (LMP Unknown)   SpO2 100%   BMI 37.68 kg/m   Physical Exam Vitals reviewed.  HENT:     Mouth/Throat:     Mouth: Mucous membranes are moist.  Cardiovascular:     Rate and Rhythm: Normal rate and regular rhythm.     Heart sounds: Normal heart sounds.  Pulmonary:     Effort: Pulmonary effort  is normal.     Breath sounds: Normal breath sounds.  Abdominal:     Palpations: Abdomen is soft.     Tenderness: There is no abdominal tenderness.  Musculoskeletal:        General: Swelling present. No tenderness. Normal range of motion.     Right lower leg: Edema present.     Left lower leg: Edema present.  Skin:    General: Skin is warm.  Neurological:     Mental Status: She is alert and oriented to person, place, and time.  Psychiatric:        Behavior: Behavior normal.     Imaging: NM PET Image Initial (PI) Skull Base To Thigh  Result Date: 02/25/2021 CLINICAL DATA:  Initial treatment strategy for enlarging pulmonary nodules on chest CT. No given history of malignancy. History cirrhosis and congestive heart failure. EXAM: NUCLEAR MEDICINE PET SKULL BASE TO THIGH TECHNIQUE: 10.5 mCi F-18 FDG was injected intravenously. Full-ring PET imaging was performed from the skull base to thigh after the radiotracer. CT data was obtained and used for attenuation correction and anatomic localization. Fasting blood glucose: 104 mg/dl COMPARISON:  PET-CT 03/08/2018, abdominal MRI  11/12/2019 and chest CT 02/02/2020 and 02/01/2021. FINDINGS: Mediastinal blood pool activity: SUV max 2.3 NECK: No hypermetabolic cervical lymph nodes are identified.Minimal asymmetric metabolic activity in the left nasopharynx (SUV max 3.0) without focal abnormality on CT imaging. No other lesions of the pharyngeal mucosal space are identified. Incidental CT findings: Bilateral parotid calcifications are noted without residual hypermetabolic activity. Bilateral carotid atherosclerosis. CHEST: There are no hypermetabolic mediastinal, hilar or axillary lymph nodes. There are 2 right lung masses which have slowly grown since 2019. The anterior lesion which is noted in close proximity to the minor fissure currently measures 5.4 x 2.6 cm on image 97/3 and has an SUV max of 2.9. On 2019 PET-CT, this measured 2.5 x 1.8 cm and had an SUV max of 2.7. The lesion in the right lower lobe measures 2.7 x 2.1 cm on image 105/3 and has an SUV max of 2.3. Previously, this measured 1.6 cm maximally and had an SUV max of 2.1. No new nodules are seen. Incidental CT findings: Mild centrilobular emphysema. Atherosclerosis of the aorta, great vessels and coronary arteries. The heart is enlarged, and there is a small pericardial effusion. Right IJ Port-A-Cath extends to the superior cavoatrial junction. ABDOMEN/PELVIS: There is no hypermetabolic activity within the liver, adrenal glands, spleen or pancreas. There is no hypermetabolic nodal activity. Incidental CT findings: Patient has a TIPS shunt. Embolization coils are noted in the central mesentery. Moderate splenomegaly and cholelithiasis are noted. Low-density left adnexal lesion measures 5.1 cm on image 221/3, decreased in size from prior PET-CT at which time it measured up to 6.6 cm. No associated hypermetabolic activity. No ascites. SKELETON: There is no hypermetabolic activity to suggest osseous metastatic disease. Incidental CT findings: Diffuse edema throughout the subcutaneous  fat, greatest in the abdomen and pelvis. No acute or focal osseous abnormalities. There are facet degenerative changes in the lower lumbar spine. There are subcutaneous calcifications in the lower back and posterior pelvis. IMPRESSION: 1. The slowly enlarging right lung masses demonstrate only low level hypermetabolic activity, similar to remote PET-CT from 3 years ago. This argues against a malignant process. Given the parotid calcifications, consider Sjogren syndrome and associated connective tissue disorder accounting for the pulmonary findings. Continued CT follow-up recommended. 2. Low-level hypermetabolic activity in the left nasopharynx without associated focal lesion on CT imaging.  3. No residual parotid hypermetabolic activity. 4. Stable chronic findings of cirrhosis and portal hypertension post TIPS. Electronically Signed   By: Richardean Sale M.D.   On: 02/25/2021 09:25   US ABDOMINAL PELVIC ART/VENT FLOW DOPPLER  Result Date: 02/09/2021 CLINICAL DATA:  70 year old female status post tips 08/07/2018 for emergent bleeding EXAM: DUPLEX ULTRASOUND OF LIVER AND TIPS SHUNT TECHNIQUE: Color and duplex Doppler ultrasound was performed to evaluate the hepatic in-flow and out-flow vessels. COMPARISON:  06/23/2021, 01/20/2020 FINDINGS: Portal Vein Velocities Main:  41 cm/sec Right:  46 cm/sec Left: Not imaged TIPS Stent Velocities Proximal:  57 cm/sec Mid:  95 Distal:  142 cm/sec Hepatic Vein Velocities Right:  38 cm/sec Mid:  37 cm/sec Left: Not visualized Splenic Vein: 39 Superior Mesenteric Vein: Not visualized Hepatic Artery: Not visualized Ascities: Absent Varices: Absent Other findings: heterogeneous appearance of the liver, compatible with medical liver disease. IMPRESSION: Patent TIPS, with the flow velocities essentially unchanged from the comparison duplex studies. Remainder of the exam is somewhat limited for complete evaluation of the hepatic vasculature given the reduced sonographic window. Signed,  Dulcy Fanny. Dellia Nims, RPVI [facility] Radiology Electronically Signed   By: Corrie Mckusick D.O.   On: 02/09/2021 13:27   IR Radiologist Eval & Mgmt  Result Date: 02/09/2021 CLINICAL DATA:  70 year old female status post tips 08/07/2018 for emergent bleeding EXAM: DUPLEX ULTRASOUND OF LIVER AND TIPS SHUNT TECHNIQUE: Color and duplex Doppler ultrasound was performed to evaluate the hepatic in-flow and out-flow vessels. COMPARISON:  06/23/2021, 01/20/2020 FINDINGS: Portal Vein Velocities Main:  41 cm/sec Right:  46 cm/sec Left: Not imaged TIPS Stent Velocities Proximal:  57 cm/sec Mid:  95 Distal:  142 cm/sec Hepatic Vein Velocities Right:  38 cm/sec Mid:  37 cm/sec Left: Not visualized Splenic Vein: 39 Superior Mesenteric Vein: Not visualized Hepatic Artery: Not visualized Ascities: Absent Varices: Absent Other findings: heterogeneous appearance of the liver, compatible with medical liver disease. IMPRESSION: Patent TIPS, with the flow velocities essentially unchanged from the comparison duplex studies. Remainder of the exam is somewhat limited for complete evaluation of the hepatic vasculature given the reduced sonographic window. Signed, Dulcy Fanny. Dellia Nims, RPVI [facility] Radiology Electronically Signed   By: Corrie Mckusick D.O.   On: 02/09/2021 13:27    Labs:  CBC: Recent Labs    12/03/20 1630 01/03/21 1354 02/01/21 1309 03/01/21 1327  WBC 4.8 5.7 4.8 5.6  HGB 10.6* 10.1* 9.8* 9.7*  HCT 33.2* 30.6* 30.3* 29.7*  PLT 120* 135* 108* 144*    COAGS: Recent Labs    03/07/21 0715  INR 1.3*    BMP: Recent Labs    05/24/20 1026 05/27/20 1438 06/08/20 1137 12/02/20 1943 02/09/21 0000 02/21/21 1034 03/01/21 1327 03/07/21 0715  NA 137 137 139   < > 134* 134* 130* 135  K 4.1 3.4* 3.9   < > 4.7 4.0 3.9 5.0  CL 104 109 106   < > 102 103 101 101  CO2 15* 22 18*   < > _0 GLUCOSE  176* 105* 156*   < > 174* 162* 136* 127*  BUN _1 < > 27* 27* 26* 23  CALCIUM 8.6* 8.3* 9.0   < > 8.8 8.7* 8.6* 8.7*  CREATININE 1.71* 1.61* 1.75*   < > 2.20* 2.12* 1.89* 2.42*  GFRNONAA 30* 33* 29*   < > 22* 25* 29* 21*  GFRAA 35*  38* 34*  --  26*  --   --   --    < > = values in this interval not displayed.    LIVER FUNCTION TESTS: Recent Labs    04/22/20 1951 02/09/21 0000  BILITOT 1.6* 1.5*  AST 37 26  ALT 16 12  ALKPHOS 120  --   PROT 6.8 5.8*  ALBUMIN 2.8*  --     TUMOR MARKERS: No results for input(s): AFPTM, CEA, CA199, CHROMGRNA in the last 8760 hours.  Assessment and Plan:  Post TIPS procedure 08/07/18 Follows with Dr Marius Ditch and Dr [name] Pt feeling abd fullness; itchy skin Definite Bilat leg swelling Scheduled for TIPs evaluation/ measurements with possible angioplasty/stent placement Pt is aware of procedure benefits and risks including but not ,imited to Infection; bleeding; damage to structures Agreeable to proceed Consent signed and in chart   Thank you for this interesting consult.  I greatly enjoyed meeting Crystal [name] and look forward to participating in their care.  A copy of this report was sent to the requesting provider on this date.  Electronically Signed: Lavonia Drafts, PA-C 03/07/2021, 9:00 AM   I spent a total of    25 Minutes in face to face in clinical consultation, greater than 50% of which was counseling/coordinating care for re evaluate TIPs

## 2021-03-07 NOTE — Sedation Documentation (Addendum)
15 IVC Pressure 18/10 (13) Right Atrial Pressure 26 Portal Pressure

## 2021-03-07 NOTE — Procedures (Signed)
Interventional Radiology Procedure Note  Procedure: Right IJ approach for TIPS shunt study, with pressure measurements, and CO2 venogram.   Finding:  IVC: 11mHg Right Atrium: 18/160mg (1336m) Portal Vein: 76m69SWNIomplications: None  Recommendations:  - Routine wound care at the right IJ access - 1 hr recovery - advance diet - DC 1 hour - Follow up with Dr. WagEarleen Newport previously scheduled - Follow up with Dr. VanMarolyn Haller schedule  Signed,  JaiDulcy FannyagEarleen NewportO

## 2021-03-07 NOTE — Progress Notes (Addendum)
CRITICAL VALUE STICKER  CRITICAL VALUE: CBG 96  DATE & TIME NOTIFIED: 4/25   1025  RESPONSE: Pt post procedure, eating and drinking. Will recheck in 30 min.   CBG 47 on 30 min recheck. Pt not symptomatic. More juice given.  I-Stat to validate finger sticks show glucose as 213. Pt to D/C.

## 2021-03-08 ENCOUNTER — Other Ambulatory Visit: Payer: Self-pay | Admitting: Family

## 2021-03-08 ENCOUNTER — Inpatient Hospital Stay: Payer: Medicare HMO

## 2021-03-08 NOTE — Telephone Encounter (Signed)
Spoke to pt to follow up after she had IV Lasix on 4/19.  Pt reports weight has not decr. 4/24 202 lbs 4/26 (today) 205 lb.  States breathing has not changed, continues DOE. No incr in SOB.  Pt continues Torsemide 76m 2 tablets twice daily. Refill sent.

## 2021-03-10 ENCOUNTER — Telehealth: Payer: Self-pay | Admitting: Internal Medicine

## 2021-03-10 NOTE — Telephone Encounter (Signed)
Spoke to pt. C/o incr swelling in BLE today. "My legs feel more tight and I am unable to lift them they feel so heavy." Denies any weeping of fluid, only incr in BLE swelling.  Weight remains consistent at 206lb. No wt decr as she continues Torsemide 13m BID.  Shortness of breath continues. No incr.  Incr weakness.  [facility]  Most recent plan to discuss with Dr. ESaunders Revelrecommendations since volume control has been difficult.

## 2021-03-10 NOTE — Telephone Encounter (Signed)
Patient calling to update Crystal [name] States that her weight is still 26 - has had no success in getting weight off/swelling down She is SOB and no energy  Please call to discuss

## 2021-03-10 NOTE — Telephone Encounter (Signed)
Volume status has been markedly difficult to control. Will CC Dr. Saunders Revel for additional recommendations given concerns regarding renal function. She has been maintained on Spironolactone 130m QD per GI. Her next appointment with him is scheduled 04/01/21 and [facility]   Dr. ESaunders Revel- She has required IV Lasix x3 doses since 12/09/20. Creatinine has steadily increased into 2's from previous baseline of 1.7. She has been transitioned to PO Torsemide without improvement. Would you recommend additional dose of IV Lasix versus increased dose Torsemide? Input much appreciated. History of recent interventions detailed below.  CLoel Dubonnet NP  __________________________  Date Weight Intervention  09/14/20 190 lbs   12/08/20 206 lbs Change PO Lasix 875mBID  12/09/20  Lasix 8017mV x1 dose  12/16/20 196 lbs Change PO Lasix 45m91m with additional dose for weight gain.  01/13/21 188 lbs   01/25/21  Pt saw nephrology. Resumed Lasix 45mg61m.  01/27/21  Pt reported weight gain via phone.   02/01/21  Lasix 45mg 61m1 dose.  02/11/21  Pt reported weigh gain via phone.   02/14/21 199 lbs Stop Lasix. Start Torsemide 40mg B53m  02/22/21  Pt reported weight gain via phone.  03/01/21  Lasix 45mg IV71mdose.   Lab work:  02/21/21 - creatinine 2.12, GFR 25, K 4.0  03/01/21 - creatinine 1.89, GFR 29, K 3.9  03/07/21 @ 0715 - creatinine 2.42, GFR 21, K 5  03/07/21 @ 1207 - creatinine 2.3, GFR not reported, K 4.2

## 2021-03-14 NOTE — Telephone Encounter (Signed)
Called Miss Rodak regarding Dr. Darnelle Bos recommendation for right cardiac catheterization to better understand her hemodynamics.   Left VM requesting call back so we can go over the risk/benefit of the procedure prior to scheduling.   Loel Dubonnet, NP

## 2021-03-14 NOTE — Telephone Encounter (Signed)
Spoke with Miss Ursin regarding [place]. The risks [stroke (1 in 1000), death (1 in 27), kidney failure [usually temporary] (1 in 500), bleeding (1 in 200), allergic reaction [possibly serious] (1 in 200)], benefits (diagnostic support and management of coronary artery disease) and alternatives of a cardiac catheterization were discussed in detail with Ms. Emry and she wishes to think about it and discuss at upcoming visit 04/01/21 with Dr. Saunders Revel.   Notes her shortness of breath has worsened over the last week with cough that is sometimes productive. No wheeze nor fever. Reports home weight is stable at 205 lbs.   Discussed recommendation to increase Torsemide to 102m (three tablets) BID x 3 days then return to 474m(two tablets) BID. She verbalizes understanding.  Will check in with her Thursday or Friday this week to assess response to diuresis.   CaLoel DubonnetNP

## 2021-03-15 DIAGNOSIS — H524 Presbyopia: Secondary | ICD-10-CM | POA: Diagnosis not present

## 2021-03-17 ENCOUNTER — Other Ambulatory Visit: Payer: Self-pay | Admitting: Interventional Radiology

## 2021-03-17 DIAGNOSIS — K922 Gastrointestinal hemorrhage, unspecified: Secondary | ICD-10-CM

## 2021-03-22 DIAGNOSIS — Z6835 Body mass index (BMI) 35.0-35.9, adult: Secondary | ICD-10-CM | POA: Diagnosis not present

## 2021-03-22 DIAGNOSIS — R051 Acute cough: Secondary | ICD-10-CM | POA: Diagnosis not present

## 2021-03-22 DIAGNOSIS — I509 Heart failure, unspecified: Secondary | ICD-10-CM | POA: Diagnosis not present

## 2021-03-22 DIAGNOSIS — C3490 Malignant neoplasm of unspecified part of unspecified bronchus or lung: Secondary | ICD-10-CM | POA: Diagnosis not present

## 2021-03-24 ENCOUNTER — Other Ambulatory Visit: Payer: Self-pay | Admitting: Internal Medicine

## 2021-03-24 DIAGNOSIS — R635 Abnormal weight gain: Secondary | ICD-10-CM

## 2021-03-24 DIAGNOSIS — M25472 Effusion, left ankle: Secondary | ICD-10-CM

## 2021-03-24 DIAGNOSIS — M25475 Effusion, left foot: Secondary | ICD-10-CM

## 2021-03-24 DIAGNOSIS — R609 Edema, unspecified: Secondary | ICD-10-CM

## 2021-03-24 DIAGNOSIS — M25474 Effusion, right foot: Secondary | ICD-10-CM

## 2021-04-01 ENCOUNTER — Ambulatory Visit (INDEPENDENT_AMBULATORY_CARE_PROVIDER_SITE_OTHER): Payer: Medicare HMO | Admitting: Internal Medicine

## 2021-04-01 ENCOUNTER — Other Ambulatory Visit: Payer: Self-pay

## 2021-04-01 VITALS — BP 120/60 | HR 78 | Ht 62.0 in | Wt 204.0 lb

## 2021-04-01 DIAGNOSIS — I2510 Atherosclerotic heart disease of native coronary artery without angina pectoris: Secondary | ICD-10-CM

## 2021-04-01 DIAGNOSIS — E1122 Type 2 diabetes mellitus with diabetic chronic kidney disease: Secondary | ICD-10-CM

## 2021-04-01 DIAGNOSIS — R600 Localized edema: Secondary | ICD-10-CM

## 2021-04-01 DIAGNOSIS — Z01818 Encounter for other preprocedural examination: Secondary | ICD-10-CM | POA: Diagnosis not present

## 2021-04-01 DIAGNOSIS — I5032 Chronic diastolic (congestive) heart failure: Secondary | ICD-10-CM

## 2021-04-01 DIAGNOSIS — N184 Chronic kidney disease, stage 4 (severe): Secondary | ICD-10-CM | POA: Diagnosis not present

## 2021-04-01 DIAGNOSIS — Z01810 Encounter for preprocedural cardiovascular examination: Secondary | ICD-10-CM

## 2021-04-01 DIAGNOSIS — Z79899 Other long term (current) drug therapy: Secondary | ICD-10-CM | POA: Diagnosis not present

## 2021-04-01 DIAGNOSIS — K7460 Unspecified cirrhosis of liver: Secondary | ICD-10-CM

## 2021-04-01 NOTE — Progress Notes (Signed)
Follow-up Outpatient Visit Date: 04/01/2021  Primary Care Provider: Martin Majestic, FNP [place] [id number]  Chief Complaint: Leg swelling  HPI:  Ms. Roller is a 70 y.o. female with history of chronic HFpEF, cirrhosis complicated by esophageal varices status post TIPS, peptic ulcer disease, type 2 diabetes mellitus, pulmonary nodules, iron deficiency anemia, and vitamin B12 deficiency, who presents for follow-up of fluid retention.  She has struggled with management of her volume over the last several months.  She was last seen in our office in early April by [name], NP acting our office again for weight gain and edema.  Weight was up 11 pounds from her visit a month earlier.  Reds vest reading was normal at 33%.  She was switched from furosemide to torsemide.  Due to persistent edema, she was referred for IV Lasix at the cancer center.  Today, Ms. Olthoff reports that she continues to struggle with significant swelling in her legs.  Escalation of torsemide has not helped much.  She notices transient improvement with IV furosemide, though this is short-lived.  She notes recent environmental allergies that have caused her to cough.  She denies shortness of breath as well as chest pain, palpitations, lightheadedness, orthopnea, and PND.  --------------------------------------------------------------------------------------------------  Past Medical History:  Diagnosis Date  . Acute GI bleeding 02/09/2018  . Acute upper gastrointestinal bleeding 08/03/2018  . Anemia    TAKES IRON TAB  . Arthritis    SHOULDER  . B12 deficiency 02/18/2018  . Bleeding    GI  3/19  . Bronchitis    HX OF  . CHF (congestive heart failure) ([place])   . Cirrhosis ([place])   . Colon cancer screening   . Diabetes mellitus without complication ([place])    TYPE 2  . Fatty (change of) liver, not elsewhere classified   . Full dentures    UPPER AND LOWER  . Iron deficiency anemia due to chronic blood  loss 02/18/2018  . Lung nodule, multiple   . Pernicious anemia 02/22/2018  . PUD (peptic ulcer disease)   . Raynaud's syndrome   . Upper GI bleeding 08/03/2018   Past Surgical History:  Procedure Laterality Date  . CATARACT EXTRACTION W/PHACO Right 02/06/2018   Procedure: CATARACT EXTRACTION PHACO AND INTRAOCULAR LENS PLACEMENT ([place]) RIGHT DIABETIC;  Surgeon: Leandrew Koyanagi, MD;  Location: [place];  Service: Ophthalmology;  Laterality: Right;  DIABETIC, ORAL MED  . CATARACT EXTRACTION W/PHACO Left 04/30/2018   Procedure: CATARACT EXTRACTION PHACO AND INTRAOCULAR LENS PLACEMENT ([facility]);  Surgeon: Leandrew Koyanagi, MD;  Location: [facility];  Service: Ophthalmology;  Laterality: Left;  [place] 01:04 AP% 21.3 CDE 13.66 Fluid pack lot # [id number]  . COLONOSCOPY WITH PROPOFOL N/A 03/25/2018   Procedure: COLONOSCOPY WITH PROPOFOL;  Surgeon: Lin Landsman, MD;  Location: [facility] ENDOSCOPY;  Service: Gastroenterology;  Laterality: N/A;  . DENTAL SURGERY     EXTRACTIONS  . ELECTROMAGNETIC NAVIGATION BROCHOSCOPY Right 01/27/2019   Procedure: ELECTROMAGNETIC NAVIGATION BRONCHOSCOPY;  Surgeon: [name], MD;  Location: [facility];  Service: Cardiopulmonary;  Laterality: Right;  . ENTEROSCOPY N/A 07/28/2020   Procedure: Push ENTEROSCOPY;  Surgeon: Lin Landsman, MD;  Location: [facility]COPY;  Service: Gastroenterology;  Laterality: N/A;  . ESOPHAGOGASTRODUODENOSCOPY N/A 08/03/2018   Procedure: ESOPHAGOGASTRODUODENOSCOPY (EGD);  Surgeon: Lin Landsman, MD;  Location: [facility] ENDOSCOPY;  Service: Gastroenterology;  Laterality: N/A;  . ESOPHAGOGASTRODUODENOSCOPY (EGD) WITH PROPOFOL N/A 02/10/2018   Procedure: ESOPHAGOGASTRODUODENOSCOPY (EGD) WITH PROPOFOL;  Surgeon: Jonathon Bellows,  MD;  Location: [facility] ENDOSCOPY;  Service: Gastroenterology;  Laterality: N/A;  . ESOPHAGOGASTRODUODENOSCOPY (EGD) WITH PROPOFOL N/A 03/25/2018   Procedure: ESOPHAGOGASTRODUODENOSCOPY (EGD) WITH PROPOFOL;  Surgeon:  Lin Landsman, MD;  Location: [facility] ENDOSCOPY;  Service: Gastroenterology;  Laterality: N/A;  . ESOPHAGOGASTRODUODENOSCOPY (EGD) WITH PROPOFOL N/A 04/22/2018   Procedure: ESOPHAGOGASTRODUODENOSCOPY (EGD) WITH PROPOFOL with band ligation;  Surgeon: Lin Landsman, MD;  Location: [place];  Service: Gastroenterology;  Laterality: N/A;  . ESOPHAGOGASTRODUODENOSCOPY (EGD) WITH PROPOFOL N/A 06/24/2018   Procedure: ESOPHAGOGASTRODUODENOSCOPY (EGD) WITH PROPOFOL;  Surgeon: Lin Landsman, MD;  Location: [facility] ENDOSCOPY;  Service: Gastroenterology;  Laterality: N/A;  . ESOPHAGOGASTRODUODENOSCOPY (EGD) WITH PROPOFOL N/A 11/19/2018   Procedure: ESOPHAGOGASTRODUODENOSCOPY (EGD) WITH PROPOFOL;  Surgeon: Lin Landsman, MD;  Location: [facility] ENDOSCOPY;  Service: Gastroenterology;  Laterality: N/A;  . ESOPHAGOGASTRODUODENOSCOPY (EGD) WITH PROPOFOL N/A 03/08/2020   Procedure: ESOPHAGOGASTRODUODENOSCOPY (EGD) WITH PROPOFOL;  Surgeon: Lin Landsman, MD;  Location: [facility] ENDOSCOPY;  Service: Gastroenterology;  Laterality: N/A;  . EYE SURGERY    . GIVENS CAPSULE STUDY N/A 03/29/2020   Procedure: GIVENS CAPSULE STUDY;  Surgeon: Lin Landsman, MD;  Location: [facility] ENDOSCOPY;  Service: Gastroenterology;  Laterality: N/A;  . HEMORRHOID BANDING  03/25/2018   Procedure: HEMORRHOID BANDING;  Surgeon: Lin Landsman, MD;  Location: [facility] ENDOSCOPY;  Service: Gastroenterology;;  . IR EMBO ART  VEN HEMORR LYMPH EXTRAV  INC GUIDE ROADMAPPING  08/07/2018  . IR RADIOLOGIST EVAL & MGMT  09/03/2018  . IR RADIOLOGIST EVAL & MGMT  12/17/2018  . IR RADIOLOGIST EVAL & MGMT  06/24/2019  . IR RADIOLOGIST EVAL & MGMT  01/22/2020  . IR RADIOLOGIST EVAL & MGMT  02/09/2021  . IR TIPS  08/07/2018  . IR TRANSHEPATIC PORTOGRAM W HEMO  03/07/2021  . IR US GUIDE VASC ACCESS RIGHT  03/07/2021  . IR VENO/JUGULAR LEFT  03/07/2021  . PORTA CATH INSERTION N/A 07/30/2020   Procedure: PORTA CATH INSERTION;  Surgeon: Katha Cabal, MD;  Location: [place] CV LAB;  Service: Cardiovascular;  Laterality: N/A;  . RADIOLOGY WITH ANESTHESIA N/A 08/07/2018   Procedure: TIPS;  Surgeon: Corrie Mckusick, DO;  Location: [place];  Service: Anesthesiology;  Laterality: N/A;  . TONSILLECTOMY      Current Meds  Medication Sig  . acetaminophen (TYLENOL) 500 MG tablet Take 500 mg by mouth daily as needed for moderate pain or headache.  Marland Kitchen aspirin-sod bicarb-citric acid (ALKA-SELTZER) 325 MG TBEF tablet Take 650 mg by mouth every 6 (six) hours as needed (indigestion).  . benzonatate (TESSALON) 100 MG capsule Take 100 mg by mouth 4 (four) times daily as needed.  . cyanocobalamin (,VITAMIN B-12,) 1000 MCG/ML injection Inject 1,000 mcg into the muscle every 30 (thirty) days.   . hydrOXYzine (ATARAX/VISTARIL) 25 MG tablet Take 25 mg by mouth 2 (two) times daily.  Marland Kitchen lactulose (CHRONULAC) 10 GM/15ML solution TAKE 15 MLS (10 G TOTAL) BY MOUTH 3 (THREE) TIMES DAILY.  . metFORMIN (GLUCOPHAGE) 500 MG tablet Take 500 mg by mouth daily with breakfast.  . Misc. Devices KIT Moderate compression hose 20-30 MM HG  . ondansetron (ZOFRAN) 4 MG tablet Take 4 mg by mouth every 8 (eight) hours as needed for nausea or vomiting.   Glory Rosebush Delica Lancets 93O MISC 2 (two) times daily.  Glory Rosebush ULTRA test strip 2 (two) times daily.  . pantoprazole (PROTONIX) 40 MG tablet Take 40 mg by mouth 2 (two) times daily.  Marland Kitchen POTASSIUM CHLORIDE PO Take 15  mEq by mouth daily.  . promethazine-dextromethorphan (PROMETHAZINE-DM) 6.25-15 MG/5ML syrup Take 5 mLs by mouth as needed.  . sodium bicarbonate 650 MG tablet Take 1,300 mg by mouth 2 (two) times daily.  Marland Kitchen spironolactone (ALDACTONE) 100 MG tablet Take 100 mg by mouth daily.  . Tetrahydrozoline HCl (VISINE OP) Place 1 drop into both eyes daily as needed (irritaiton).  . torsemide (DEMADEX) 20 MG tablet TAKE 2 TABLETS BY MOUTH 2 TIMES DAILY.  Marland Kitchen Vitamin D, Ergocalciferol, (DRISDOL) 1.25 MG (50000 UNIT) CAPS  capsule Take 50,000 Units by mouth every Sunday.  Marland Kitchen XIFAXAN 550 MG TABS tablet Take 550 mg by mouth 2 (two) times daily.    Allergies: Patient has no known allergies.  Social History   Tobacco Use  . Smoking status: Never Smoker  . Smokeless tobacco: Never Used  Vaping Use  . Vaping Use: Never used  Substance Use Topics  . Alcohol use: Not Currently    Comment: No EtOH for 30 years.  Never a heavy drinker.  . Drug use: Never    Family History  Problem Relation Age of Onset  . CAD Father   . Heart attack Father 46  . Cancer Maternal Uncle   . Seizures Mother     Review of Systems: A 12-system review of systems was performed and was negative except as noted in the HPI.  --------------------------------------------------------------------------------------------------  Physical Exam: BP 120/60 (BP Location: Left Arm, Patient Position: Sitting, Cuff Size: Large)   Pulse 78   Ht 5' 2" (1.575 m)   Wt 204 lb (92.5 kg)   LMP  (LMP Unknown)   BMI 37.31 kg/m   General:  NAD. Neck: No JVD or HJR. Lungs: Clear to auscultation bilaterally without wheezes or crackles. Heart: Regular rate and rhythm without murmurs, rubs, or gallops. Abdomen: Firm and mildly distended.  No tenderness.  Unable to assess HSM due to body habitus. Extremities: 2+ "woody" edema in both calves.  EKG: Normal sinus rhythm with left axis deviation and poor R wave progression.  No significant change since 12/08/2020.  Lab Results  Component Value Date   WBC 4.5 03/07/2021   HGB 11.2 (L) 03/07/2021   HCT 33.0 (L) 03/07/2021   MCV 82.4 03/07/2021   PLT 109 (L) 03/07/2021    Lab Results  Component Value Date   NA 134 (L) 03/07/2021   K 4.2 03/07/2021   CL 104 03/07/2021   CO2 25 03/07/2021   BUN 29 (H) 03/07/2021   CREATININE 2.30 (H) 03/07/2021   GLUCOSE 213 (H) 03/07/2021   ALT 12 02/09/2021    Lab Results  Component Value Date   TRIG 163 (H) 08/03/2018     --------------------------------------------------------------------------------------------------  ASSESSMENT AND PLAN: Lower extremity edema and chronic HFpEF: Unfortunately, Crystal [name] continues to struggle with significant leg edema that responds only transient to IV furosemide.  She has not noticed much improvement with torsemide.  Her renal function remains impaired, having gradually worsened over the last year.  Most recent echocardiogram in 12/2020 showed normal LVEF with grade 1 diastolic dysfunction as well as normal RV size and function.  PA pressure could not be assessed.  Her edema is out of proportion to her other heart failure symptoms, suggesting that she may have underlying right heart failure or a noncardiac etiology for her persistent edema (e.g. renal and liver failure).  As we have struggled to get her swelling under better control despite aggressive diuretic therapy, I have recommended proceeding with right heart catheterization to  better understand her hemodynamics.  Ms. Winslow is in agreement with this.  In the meantime, we will continue torsemide 40 mg twice daily as well as spironolactone 100 mg daily.  I encouraged Ms. Brau to continue with fluid and sodium restriction.  Chronic kidney disease stage IV: Creatinine has been gradually rising, most recently 2.3 on 03/07/2021.  We will repeat CMP shortly before we will upcoming right heart catheterization to reassess renal function.  Cirrhosis status post TIPS: I suspect cirrhosis is playing some role in lower extremity edema.  TIPS shunt places Ms. Dungee at risk for pulmonary hypertension and right heart failure.  We will therefore proceed with RHC, as outlined above.  Type 2 diabetes mellitus: Ms. Kley remains on metformin with worsening renal function.  I will defer ongoing management to her PCP, though transitioning away from metformin should be considered in light of underlying renal and hepatic disease.  Shared Decision  Making/Informed Consent The risks [stroke (1 in 1000), death (1 in 1000), kidney failure [usually temporary] (1 in 500), bleeding (1 in 200), allergic reaction [possibly serious] (1 in 200)], benefits (diagnostic support and management of coronary artery disease) and alternatives of a cardiac catheterization were discussed in detail with Ms. Glasner and she is willing to proceed.  Follow-up: Return to clinic 2 weeks after right heart catheterization.  Nelva Bush, MD 04/01/2021 2:56 PM

## 2021-04-01 NOTE — Patient Instructions (Addendum)
Medication Instructions:  - Your physician recommends that you continue on your current medications as directed. Please refer to the Current Medication list given to you today.  *If you need a refill on your cardiac medications before your next appointment, please call your pharmacy*   Lab Work: - Your physician recommends that you return for lab work Friday 04/15/21 - CBC/ CMET/ INR  Medical Mall Entrance at [facility] 1st desk on the right to check in, past the screening table Lab hours: Monday- Friday (7:30 am- 5:30 pm)  If you have labs (blood work) drawn today and your tests are completely normal, you will receive your results only by: Marland Kitchen MyChart Message (if you have MyChart) OR . A paper copy in the mail If you have any lab test that is abnormal or we need to change your treatment, we will call you to review the results.   Testing/Procedures: - Your physician has requested that you have a cardiac catheterization. Cardiac catheterization is used to diagnose and/or treat various heart conditions. Doctors may recommend this procedure for a number of different reasons. The most common reason is to evaluate chest pain. Chest pain can be a symptom of coronary artery disease (CAD), and cardiac catheterization can show whether plaque is narrowing or blocking your heart's arteries. This procedure is also used to evaluate the valves, as well as measure the blood flow and oxygen levels in different parts of your heart.  We will send your instructions through your MyChart   Follow-Up: At [facility], you and your health needs are our priority.  As part of our continuing mission to provide you with exceptional heart care, we have created designated Provider Care Teams.  These Care Teams include your primary Cardiologist (physician) and Advanced Practice Providers (APPs -  Physician Assistants and Nurse Practitioners) who all work together to provide you with the care you need, when you need it.  We  recommend signing up for the patient portal called "MyChart".  Sign up information is provided on this After Visit Summary.  MyChart is used to connect with patients for Virtual Visits (Telemedicine).  Patients are able to view lab/test results, encounter notes, upcoming appointments, etc.  Non-urgent messages can be sent to your provider as well.   To learn more about what you can do with MyChart, go to [email or web address].    Your next appointment:   2 week(s)- From 04/22/21  The format for your next appointment:   In Person  Provider:   You may see Nelva Bush, MD or one of the following Advanced Practice Providers on your designated Care Team:    Murray Hodgkins, NP  Christell Faith, PA-C  Marrianne Mood, PA-C  Cadence Winner, [place]  [name], NP    Other Instructions n/a

## 2021-04-03 ENCOUNTER — Encounter: Payer: Self-pay | Admitting: Internal Medicine

## 2021-04-04 ENCOUNTER — Emergency Department: Payer: Medicare HMO

## 2021-04-04 ENCOUNTER — Inpatient Hospital Stay
Admission: EM | Admit: 2021-04-04 | Discharge: 2021-04-08 | DRG: 442 | Disposition: A | Discharge status: Home Health | Payer: Medicare HMO | Attending: Internal Medicine | Admitting: Internal Medicine

## 2021-04-04 DIAGNOSIS — D696 Thrombocytopenia, unspecified: Secondary | ICD-10-CM | POA: Diagnosis not present

## 2021-04-04 DIAGNOSIS — I82431 Acute embolism and thrombosis of right popliteal vein: Secondary | ICD-10-CM | POA: Diagnosis not present

## 2021-04-04 DIAGNOSIS — N183 Chronic kidney disease, stage 3 unspecified: Secondary | ICD-10-CM | POA: Diagnosis present

## 2021-04-04 DIAGNOSIS — K7682 Hepatic encephalopathy: Secondary | ICD-10-CM | POA: Diagnosis present

## 2021-04-04 DIAGNOSIS — D6959 Other secondary thrombocytopenia: Secondary | ICD-10-CM | POA: Diagnosis present

## 2021-04-04 DIAGNOSIS — K7460 Unspecified cirrhosis of liver: Secondary | ICD-10-CM | POA: Diagnosis present

## 2021-04-04 DIAGNOSIS — K7200 Acute and subacute hepatic failure without coma: Principal | ICD-10-CM | POA: Diagnosis present

## 2021-04-04 DIAGNOSIS — Z6837 Body mass index (BMI) 37.0-37.9, adult: Secondary | ICD-10-CM | POA: Diagnosis not present

## 2021-04-04 DIAGNOSIS — I82502 Chronic embolism and thrombosis of unspecified deep veins of left lower extremity: Secondary | ICD-10-CM | POA: Diagnosis present

## 2021-04-04 DIAGNOSIS — M79673 Pain in unspecified foot: Secondary | ICD-10-CM

## 2021-04-04 DIAGNOSIS — K766 Portal hypertension: Secondary | ICD-10-CM | POA: Diagnosis not present

## 2021-04-04 DIAGNOSIS — I8510 Secondary esophageal varices without bleeding: Secondary | ICD-10-CM | POA: Diagnosis present

## 2021-04-04 DIAGNOSIS — Z7984 Long term (current) use of oral hypoglycemic drugs: Secondary | ICD-10-CM | POA: Diagnosis not present

## 2021-04-04 DIAGNOSIS — K7290 Hepatic failure, unspecified without coma: Secondary | ICD-10-CM

## 2021-04-04 DIAGNOSIS — K219 Gastro-esophageal reflux disease without esophagitis: Secondary | ICD-10-CM | POA: Diagnosis not present

## 2021-04-04 DIAGNOSIS — I824Z9 Acute embolism and thrombosis of unspecified deep veins of unspecified distal lower extremity: Secondary | ICD-10-CM | POA: Diagnosis present

## 2021-04-04 DIAGNOSIS — R69 Illness, unspecified: Secondary | ICD-10-CM | POA: Diagnosis not present

## 2021-04-04 DIAGNOSIS — Z20822 Contact with and (suspected) exposure to covid-19: Secondary | ICD-10-CM | POA: Diagnosis not present

## 2021-04-04 DIAGNOSIS — E11649 Type 2 diabetes mellitus with hypoglycemia without coma: Secondary | ICD-10-CM | POA: Diagnosis present

## 2021-04-04 DIAGNOSIS — Z743 Need for continuous supervision: Secondary | ICD-10-CM | POA: Diagnosis not present

## 2021-04-04 DIAGNOSIS — Z8249 Family history of ischemic heart disease and other diseases of the circulatory system: Secondary | ICD-10-CM

## 2021-04-04 DIAGNOSIS — Z8719 Personal history of other diseases of the digestive system: Secondary | ICD-10-CM | POA: Diagnosis not present

## 2021-04-04 DIAGNOSIS — M79672 Pain in left foot: Secondary | ICD-10-CM | POA: Diagnosis not present

## 2021-04-04 DIAGNOSIS — K7469 Other cirrhosis of liver: Secondary | ICD-10-CM | POA: Diagnosis present

## 2021-04-04 DIAGNOSIS — K7581 Nonalcoholic steatohepatitis (NASH): Secondary | ICD-10-CM | POA: Diagnosis present

## 2021-04-04 DIAGNOSIS — I82512 Chronic embolism and thrombosis of left femoral vein: Secondary | ICD-10-CM | POA: Diagnosis present

## 2021-04-04 DIAGNOSIS — I82409 Acute embolism and thrombosis of unspecified deep veins of unspecified lower extremity: Secondary | ICD-10-CM | POA: Diagnosis present

## 2021-04-04 DIAGNOSIS — R9431 Abnormal electrocardiogram [ECG] [EKG]: Secondary | ICD-10-CM | POA: Diagnosis not present

## 2021-04-04 DIAGNOSIS — I5032 Chronic diastolic (congestive) heart failure: Secondary | ICD-10-CM

## 2021-04-04 DIAGNOSIS — R531 Weakness: Secondary | ICD-10-CM | POA: Diagnosis not present

## 2021-04-04 DIAGNOSIS — R404 Transient alteration of awareness: Secondary | ICD-10-CM | POA: Diagnosis not present

## 2021-04-04 DIAGNOSIS — I82890 Acute embolism and thrombosis of other specified veins: Secondary | ICD-10-CM | POA: Diagnosis not present

## 2021-04-04 DIAGNOSIS — R4182 Altered mental status, unspecified: Secondary | ICD-10-CM | POA: Diagnosis not present

## 2021-04-04 DIAGNOSIS — I82461 Acute embolism and thrombosis of right calf muscular vein: Secondary | ICD-10-CM | POA: Diagnosis present

## 2021-04-04 DIAGNOSIS — E1122 Type 2 diabetes mellitus with diabetic chronic kidney disease: Secondary | ICD-10-CM

## 2021-04-04 DIAGNOSIS — E669 Obesity, unspecified: Secondary | ICD-10-CM | POA: Diagnosis present

## 2021-04-04 DIAGNOSIS — N184 Chronic kidney disease, stage 4 (severe): Secondary | ICD-10-CM | POA: Diagnosis present

## 2021-04-04 DIAGNOSIS — Z95828 Presence of other vascular implants and grafts: Secondary | ICD-10-CM

## 2021-04-04 DIAGNOSIS — E119 Type 2 diabetes mellitus without complications: Secondary | ICD-10-CM | POA: Diagnosis not present

## 2021-04-04 DIAGNOSIS — K7030 Alcoholic cirrhosis of liver without ascites: Secondary | ICD-10-CM

## 2021-04-04 DIAGNOSIS — E538 Deficiency of other specified B group vitamins: Secondary | ICD-10-CM | POA: Diagnosis present

## 2021-04-04 DIAGNOSIS — Z8711 Personal history of peptic ulcer disease: Secondary | ICD-10-CM

## 2021-04-04 DIAGNOSIS — R0902 Hypoxemia: Secondary | ICD-10-CM | POA: Diagnosis not present

## 2021-04-04 DIAGNOSIS — I82441 Acute embolism and thrombosis of right tibial vein: Secondary | ICD-10-CM | POA: Diagnosis not present

## 2021-04-04 DIAGNOSIS — I5033 Acute on chronic diastolic (congestive) heart failure: Secondary | ICD-10-CM | POA: Diagnosis not present

## 2021-04-04 LAB — COMPREHENSIVE METABOLIC PANEL
ALT: 17 U/L (ref 0–44)
ALT: 16 U/L (ref 0–44)
AST: 28 U/L (ref 15–41)
AST: 28 U/L (ref 15–41)
Albumin: 2.7 g/dL — ABNORMAL LOW (ref 3.5–5.0)
Albumin: 2.6 g/dL — ABNORMAL LOW (ref 3.5–5.0)
Alkaline Phosphatase: 141 U/L — ABNORMAL HIGH (ref 38–126)
Alkaline Phosphatase: 135 U/L — ABNORMAL HIGH (ref 38–126)
Anion gap: 12 (ref 5–15)
Anion gap: 11 (ref 5–15)
BUN: 26 mg/dL — ABNORMAL HIGH (ref 8–23)
BUN: 27 mg/dL — ABNORMAL HIGH (ref 8–23)
CO2: 22 mmol/L (ref 22–32)
CO2: 24 mmol/L (ref 22–32)
Calcium: 9 mg/dL (ref 8.9–10.3)
Calcium: 9 mg/dL (ref 8.9–10.3)
Chloride: 102 mmol/L (ref 98–111)
Chloride: 103 mmol/L (ref 98–111)
Creatinine, Ser: 2.07 mg/dL — ABNORMAL HIGH (ref 0.44–1.00)
Creatinine, Ser: 2.06 mg/dL — ABNORMAL HIGH (ref 0.44–1.00)
GFR, Estimated: 25 mL/min — ABNORMAL LOW (ref >60)
GFR, Estimated: 26 mL/min — ABNORMAL LOW (ref >60)
Glucose, Bld: 162 mg/dL — ABNORMAL HIGH (ref 70–99)
Glucose, Bld: 150 mg/dL — ABNORMAL HIGH (ref 70–99)
Potassium: 4 mmol/L (ref 3.5–5.1)
Potassium: 4 mmol/L (ref 3.5–5.1)
Sodium: 136 mmol/L (ref 135–145)
Sodium: 138 mmol/L (ref 135–145)
Total Bilirubin: 1.9 mg/dL — ABNORMAL HIGH (ref 0.3–1.2)
Total Bilirubin: 1.8 mg/dL — ABNORMAL HIGH (ref 0.3–1.2)
Total Protein: 6.6 g/dL (ref 6.5–8.1)
Total Protein: 6.2 g/dL — ABNORMAL LOW (ref 6.5–8.1)

## 2021-04-04 LAB — URINE DRUG SCREEN, QUALITATIVE (ARMC ONLY)
Amphetamines, Ur Screen: NOT DETECTED
Barbiturates, Ur Screen: NOT DETECTED
Benzodiazepine, Ur Scrn: NOT DETECTED
Cannabinoid 50 Ng, Ur ~~LOC~~: NOT DETECTED
Cocaine Metabolite,Ur ~~LOC~~: NOT DETECTED
MDMA (Ecstasy)Ur Screen: NOT DETECTED
Methadone Scn, Ur: NOT DETECTED
Opiate, Ur Screen: NOT DETECTED
Phencyclidine (PCP) Ur S: NOT DETECTED
Tricyclic, Ur Screen: NOT DETECTED

## 2021-04-04 LAB — URINALYSIS, COMPLETE (UACMP) WITH MICROSCOPIC
Bacteria, UA: NONE SEEN
Bilirubin Urine: NEGATIVE
Glucose, UA: NEGATIVE mg/dL
Hgb urine dipstick: NEGATIVE
Ketones, ur: NEGATIVE mg/dL
Nitrite: NEGATIVE
Protein, ur: NEGATIVE mg/dL
Specific Gravity, Urine: 1.006 (ref 1.005–1.030)
pH: 9 — ABNORMAL HIGH (ref 5.0–8.0)

## 2021-04-04 LAB — CBC
HCT: 31.3 % — ABNORMAL LOW (ref 36.0–46.0)
Hemoglobin: 10.4 g/dL — ABNORMAL LOW (ref 12.0–15.0)
MCH: 27.1 pg (ref 26.0–34.0)
MCHC: 33.2 g/dL (ref 30.0–36.0)
MCV: 81.5 fL (ref 80.0–100.0)
Platelets: 107 10*3/uL — ABNORMAL LOW (ref 150–400)
RBC: 3.84 MIL/uL — ABNORMAL LOW (ref 3.87–5.11)
RDW: 17.9 % — ABNORMAL HIGH (ref 11.5–15.5)
WBC: 5.9 10*3/uL (ref 4.0–10.5)
nRBC: 0 % (ref 0.0–0.2)

## 2021-04-04 LAB — AMMONIA
Ammonia: 119 umol/L — ABNORMAL HIGH (ref 9–35)
Ammonia: 88 umol/L — ABNORMAL HIGH (ref 9–35)

## 2021-04-04 LAB — CBC WITH DIFFERENTIAL/PLATELET
Abs Immature Granulocytes: 0.02 10*3/uL (ref 0.00–0.07)
Basophils Absolute: 0 10*3/uL (ref 0.0–0.1)
Basophils Relative: 1 %
Eosinophils Absolute: 0.1 10*3/uL (ref 0.0–0.5)
Eosinophils Relative: 2 %
HCT: 32.7 % — ABNORMAL LOW (ref 36.0–46.0)
Hemoglobin: 10.9 g/dL — ABNORMAL LOW (ref 12.0–15.0)
Immature Granulocytes: 0 %
Lymphocytes Relative: 15 %
Lymphs Abs: 0.9 10*3/uL (ref 0.7–4.0)
MCH: 27.7 pg (ref 26.0–34.0)
MCHC: 33.3 g/dL (ref 30.0–36.0)
MCV: 83 fL (ref 80.0–100.0)
Monocytes Absolute: 0.5 10*3/uL (ref 0.1–1.0)
Monocytes Relative: 9 %
Neutro Abs: 4.1 10*3/uL (ref 1.7–7.7)
Neutrophils Relative %: 73 %
Platelets: 110 10*3/uL — ABNORMAL LOW (ref 150–400)
RBC: 3.94 MIL/uL (ref 3.87–5.11)
RDW: 18.8 % — ABNORMAL HIGH (ref 11.5–15.5)
WBC: 5.6 10*3/uL (ref 4.0–10.5)
nRBC: 0 % (ref 0.0–0.2)

## 2021-04-04 LAB — CBG MONITORING, ED
Glucose-Capillary: 54 mg/dL — ABNORMAL LOW (ref 70–99)
Glucose-Capillary: 84 mg/dL (ref 70–99)

## 2021-04-04 LAB — ETHANOL: Alcohol, Ethyl (B): <10 mg/dL (ref <10)

## 2021-04-04 LAB — SARS CORONAVIRUS 2 (TAT 6-24 HRS): SARS Coronavirus 2: NEGATIVE

## 2021-04-04 LAB — MRSA PCR SCREENING: MRSA by PCR: NEGATIVE

## 2021-04-04 LAB — GLUCOSE, CAPILLARY
Glucose-Capillary: 69 mg/dL — ABNORMAL LOW (ref 70–99)
Glucose-Capillary: 57 mg/dL — ABNORMAL LOW (ref 70–99)
Glucose-Capillary: 78 mg/dL (ref 70–99)
Glucose-Capillary: 75 mg/dL (ref 70–99)
Glucose-Capillary: 78 mg/dL (ref 70–99)

## 2021-04-04 LAB — HIV ANTIBODY (ROUTINE TESTING W REFLEX): HIV Screen 4th Generation wRfx: NONREACTIVE

## 2021-04-04 MED ORDER — LACTULOSE 10 GM/15ML PO SOLN
30.0000 g | Freq: Once | ORAL | Status: AC
  Administered 2021-04-04: 30 g via ORAL
  Filled 2021-04-04: qty 60

## 2021-04-04 MED ORDER — TRAZODONE HCL 50 MG PO TABS: 25.0000 mg | ORAL_TABLET | Freq: Every evening | ORAL | Status: DC | PRN

## 2021-04-04 MED ORDER — SODIUM CHLORIDE 0.9% FLUSH: 10.0000 mL | INTRAVENOUS | Status: DC | PRN

## 2021-04-04 MED ORDER — LACTULOSE 10 GM/15ML PO SOLN: 30.0000 g | Freq: Three times a day (TID) | ORAL | Status: DC

## 2021-04-04 MED ORDER — ASPIRIN EFFERVESCENT 325 MG PO TBEF: 650.0000 mg | EFFERVESCENT_TABLET | Freq: Four times a day (QID) | ORAL | Status: DC | PRN

## 2021-04-04 MED ORDER — POTASSIUM CHLORIDE 20 MEQ PO PACK
20.0000 meq | PACK | Freq: Every day | ORAL | Status: DC
  Administered 2021-04-05 – 2021-04-08 (×4): 20 meq via ORAL
  Filled 2021-04-04 (×4): qty 1

## 2021-04-04 MED ORDER — PANTOPRAZOLE SODIUM 40 MG PO TBEC
40.0000 mg | DELAYED_RELEASE_TABLET | Freq: Two times a day (BID) | ORAL | Status: DC
  Administered 2021-04-05 – 2021-04-08 (×7): 40 mg via ORAL
  Filled 2021-04-04 (×7): qty 1

## 2021-04-04 MED ORDER — SODIUM CHLORIDE 0.9 % IV SOLN
2.0000 g | INTRAVENOUS | Status: DC
  Administered 2021-04-04 – 2021-04-07 (×4): 2 g via INTRAVENOUS
  Filled 2021-04-04 (×4): qty 2
  Filled 2021-04-04: qty 20

## 2021-04-04 MED ORDER — CHLORHEXIDINE GLUCONATE CLOTH 2 % EX PADS
6.0000 | MEDICATED_PAD | Freq: Every day | CUTANEOUS | Status: DC
  Administered 2021-04-05 – 2021-04-08 (×4): 6 via TOPICAL

## 2021-04-04 MED ORDER — VITAMIN D (ERGOCALCIFEROL) 1.25 MG (50000 UNIT) PO CAPS: 50000.0000 [IU] | ORAL_CAPSULE | ORAL | Status: DC

## 2021-04-04 MED ORDER — LACTULOSE 10 GM/15ML PO SOLN: 30.0000 g | Freq: Four times a day (QID) | ORAL | Status: DC

## 2021-04-04 MED ORDER — ONDANSETRON HCL 4 MG PO TABS: 4.0000 mg | ORAL_TABLET | Freq: Three times a day (TID) | ORAL | Status: DC | PRN

## 2021-04-04 MED ORDER — DEXTROSE IN LACTATED RINGERS 5 % IV SOLN: INTRAVENOUS | Status: DC

## 2021-04-04 MED ORDER — ONDANSETRON HCL 4 MG PO TABS: 4.0000 mg | ORAL_TABLET | Freq: Four times a day (QID) | ORAL | Status: DC | PRN

## 2021-04-04 MED ORDER — HYDROXYZINE HCL 25 MG PO TABS: 25.0000 mg | ORAL_TABLET | Freq: Two times a day (BID) | ORAL | Status: DC

## 2021-04-04 MED ORDER — SODIUM CHLORIDE 0.9 % IV SOLN: INTRAVENOUS | Status: DC

## 2021-04-04 MED ORDER — BENZONATATE 100 MG PO CAPS: 100.0000 mg | ORAL_CAPSULE | Freq: Four times a day (QID) | ORAL | Status: DC | PRN

## 2021-04-04 MED ORDER — MAGNESIUM HYDROXIDE 400 MG/5ML PO SUSP
30.0000 mL | Freq: Every day | ORAL | Status: DC | PRN
  Filled 2021-04-04: qty 30

## 2021-04-04 MED ORDER — LACTULOSE ENEMA: 300.0000 mL | Freq: Two times a day (BID) | ORAL | Status: DC

## 2021-04-04 MED ORDER — LORAZEPAM 2 MG/ML IJ SOLN
2.0000 mg | Freq: Once | INTRAMUSCULAR | Status: AC
  Administered 2021-04-04: 2 mg via INTRAVENOUS
  Filled 2021-04-04: qty 1

## 2021-04-04 MED ORDER — ACETAMINOPHEN 650 MG RE SUPP: 650.0000 mg | Freq: Four times a day (QID) | RECTAL | Status: DC | PRN

## 2021-04-04 MED ORDER — SODIUM BICARBONATE 650 MG PO TABS
1300.0000 mg | ORAL_TABLET | Freq: Two times a day (BID) | ORAL | Status: DC
  Administered 2021-04-05 – 2021-04-08 (×7): 1300 mg via ORAL
  Filled 2021-04-04 (×11): qty 2

## 2021-04-04 MED ORDER — ENOXAPARIN SODIUM 40 MG/0.4ML IJ SOSY
40.0000 mg | PREFILLED_SYRINGE | INTRAMUSCULAR | Status: DC
  Administered 2021-04-04 – 2021-04-07 (×4): 40 mg via SUBCUTANEOUS
  Filled 2021-04-04 (×4): qty 0.4

## 2021-04-04 MED ORDER — CYANOCOBALAMIN 1000 MCG/ML IJ SOLN
1000.0000 ug | INTRAMUSCULAR | Status: DC
  Administered 2021-04-05: 1000 ug via INTRAMUSCULAR
  Filled 2021-04-04: qty 1

## 2021-04-04 MED ORDER — SPIRONOLACTONE 25 MG PO TABS
100.0000 mg | ORAL_TABLET | Freq: Every day | ORAL | Status: DC
  Administered 2021-04-05 – 2021-04-08 (×4): 100 mg via ORAL
  Filled 2021-04-04 (×2): qty 4
  Filled 2021-04-04: qty 1
  Filled 2021-04-04 (×2): qty 4

## 2021-04-04 MED ORDER — DEXTROSE 50 % IV SOLN
1.0000 | Freq: Once | INTRAVENOUS | Status: AC
  Administered 2021-04-04: 50 mL via INTRAVENOUS

## 2021-04-04 MED ORDER — TORSEMIDE 20 MG PO TABS
40.0000 mg | ORAL_TABLET | Freq: Two times a day (BID) | ORAL | Status: DC
  Administered 2021-04-05 – 2021-04-08 (×7): 40 mg via ORAL
  Filled 2021-04-04 (×7): qty 2

## 2021-04-04 MED ORDER — ACETAMINOPHEN 325 MG PO TABS
650.0000 mg | ORAL_TABLET | Freq: Four times a day (QID) | ORAL | Status: DC | PRN
  Administered 2021-04-06: 650 mg via ORAL
  Filled 2021-04-04: qty 2

## 2021-04-04 MED ORDER — ONDANSETRON HCL 4 MG/2ML IJ SOLN: 4.0000 mg | Freq: Four times a day (QID) | INTRAMUSCULAR | Status: DC | PRN

## 2021-04-04 MED ORDER — LACTULOSE ENEMA
300.0000 mL | Freq: Two times a day (BID) | ORAL | Status: DC
  Administered 2021-04-04 – 2021-04-05 (×2): 300 mL via RECTAL
  Filled 2021-04-04 (×4): qty 300

## 2021-04-04 MED ORDER — RIFAXIMIN 550 MG PO TABS
550.0000 mg | ORAL_TABLET | Freq: Two times a day (BID) | ORAL | Status: DC
  Administered 2021-04-05 – 2021-04-08 (×7): 550 mg via ORAL
  Filled 2021-04-04 (×9): qty 1

## 2021-04-04 NOTE — ED Notes (Signed)
Lab to send someone for specimen collection.

## 2021-04-04 NOTE — ED Notes (Signed)
Patient medicated due to severe agitation and confusion. Three RNs at bedside to assist in placing rectal tube. Patient placed on 2L via [place] after medication administration. Sats maintained at 100%.

## 2021-04-04 NOTE — H&P (Signed)
[place]   PATIENT NAME: Crystal [name]    MR#:  [id number]  DATE OF BIRTH:  1951-09-15  DATE OF ADMISSION:  04/04/2021  PRIMARY CARE PHYSICIAN: Martin Majestic, FNP   Patient is coming from: Home.  REQUESTING/REFERRING PHYSICIAN: Hart Carwin, MD CHIEF COMPLAINT:   Chief Complaint  Patient presents with  . Altered Mental Status    HISTORY OF PRESENT ILLNESS:  Crystal [name] is a 70 y.o. female with medical history significant for liver cirrhosis, CHF, type 2 diabetes mellitus, peptic ulcer disease and chronic anemia, who presented to the emergency room with acute onset of altered mental status with confusion that started this evening.  The patient was at her baseline mental status earlier during the day and drove her self to a restaurant for lunch.  She was given an extra dose of lactulose by her family.  She kept screaming and was fairly agitated and restless despite that.  She vomited clear fluid once in the ER.  No fever or chills were reported.  No reported dysuria, oliguria or hematuria urgency or frequency or flank pain.  No reported chest pain or dyspnea.  She has been having mild dry cough.  The history was obtained from her friend who was with her in the room.  The patient is a very poor historian due to her restlessness and delirium.  ED Course: Upon presentation to the emergency room blood pressure was 144/61 with otherwise normal vital signs.  Labs revealed BUN of 26 and creatinine of 2.07 close to levels last month and blood glucose of 162.  Alkaline phosphatase was 141 with albumin of 2.7 and total bili 1.9.  CBC showed anemia close to baseline.  Alcohol level was less than 10.Marland Kitchen EKG as reviewed by me : Showed normal sinus rhythm with a rate of 77 with poor R wave progression and low voltage QRS with prolonged QT interval with QTC of 491 MS. Imaging: Noncontrasted head CT revealed no acute intracranial abnormalities.  The patient will be admitted to a medical  monitored bed for further evaluation and management. PAST MEDICAL HISTORY:   Past Medical History:  Diagnosis Date  . Acute GI bleeding 02/09/2018  . Acute upper gastrointestinal bleeding 08/03/2018  . Anemia    TAKES IRON TAB  . Arthritis    SHOULDER  . B12 deficiency 02/18/2018  . Bleeding    GI  3/19  . Bronchitis    HX OF  . CHF (congestive heart failure) ([place])   . Cirrhosis ([place])   . Colon cancer screening   . Diabetes mellitus without complication ([place])    TYPE 2  . Fatty (change of) liver, not elsewhere classified   . Full dentures    UPPER AND LOWER  . Iron deficiency anemia due to chronic blood loss 02/18/2018  . Lung nodule, multiple   . Pernicious anemia 02/22/2018  . PUD (peptic ulcer disease)   . Raynaud's syndrome   . Upper GI bleeding 08/03/2018    PAST SURGICAL HISTORY:   Past Surgical History:  Procedure Laterality Date  . CATARACT EXTRACTION W/PHACO Right 02/06/2018   Procedure: CATARACT EXTRACTION PHACO AND INTRAOCULAR LENS PLACEMENT ([place]) RIGHT DIABETIC;  Surgeon: Leandrew Koyanagi, MD;  Location: [facility];  Service: Ophthalmology;  Laterality: Right;  DIABETIC, ORAL MED  . CATARACT EXTRACTION W/PHACO Left 04/30/2018   Procedure: CATARACT EXTRACTION PHACO AND INTRAOCULAR LENS PLACEMENT (I[facility]);  Surgeon: Leandrew Koyanagi, MD;  Location: [facility];  Service: Ophthalmology;  Laterality: Left;  [place] 01:04 AP% 21.3 CDE 13.66 Fluid pack lot # [id number]  . COLONOSCOPY WITH PROPOFOL N/A 03/25/2018   Procedure: COLONOSCOPY WITH PROPOFOL;  Surgeon: Lin Landsman, MD;  Location: [facility] ENDOSCOPY;  Service: Gastroenterology;  Laterality: N/A;  . DENTAL SURGERY     EXTRACTIONS  . ELECTROMAGNETIC NAVIGATION BROCHOSCOPY Right 01/27/2019   Procedure: ELECTROMAGNETIC NAVIGATION BRONCHOSCOPY;  Surgeon: [name], MD;  Location: [facility];  Service: Cardiopulmonary;  Laterality: Right;  . ENTEROSCOPY N/A 07/28/2020   Procedure: Push ENTEROSCOPY;  Surgeon:  Lin Landsman, MD;  [facility] END[facility]COPY;  Service: Gastroenterology;  Laterality: N/A;  . ESOPHAGOGASTRODUODENOSCOPY N/A 08/03/2018   Procedure: ESOPHAGOGASTRODUODENOSCOPY (EGD);  Surgeon: Lin Landsman, MD;  Location: [facility] ENDOSCOPY;  Service: Gastroenterology;  Laterality: N/A;  . ESOPHAGOGASTRODUODENOSCOPY (EGD) WITH PROPOFOL N/A 02/10/2018   Procedure: ESOPHAGOGASTRODUODENOSCOPY (EGD) WITH PROPOFOL;  Surgeon: Jonathon Bellows, MD;  Location: [facility] ENDOSCOPY;  Service: Gastroenterology;  Laterality: N/A;  . ESOPHAGOGASTRODUODENOSCOPY (EGD) WITH PROPOFOL N/A 03/25/2018   Procedure: ESOPHAGOGASTRODUODENOSCOPY (EGD) WITH PROPOFOL;  Surgeon: Lin Landsman, MD;  Location: [facility] ENDOSCOPY;  Service: Gastroenterology;  Laterality: N/A;  . ESOPHAGOGASTRODUODENOSCOPY (EGD) WITH PROPOFOL N/A 04/22/2018   Procedure: ESOPHAGOGASTRODUODENOSCOPY (EGD) WITH PROPOFOL with band ligation;  Surgeon: Lin Landsman, MD;  Location: [place];  Service: Gastroenterology;  Laterality: N/A;  . ESOPHAGOGASTRODUODENOSCOPY (EGD) WITH PROPOFOL N/A 06/24/2018   Procedure: ESOPHAGOGASTRODUODENOSCOPY (EGD) WITH PROPOFOL;  Surgeon: Lin Landsman, MD;  Location: [facility] ENDOSCOPY;  Service: Gastroenterology;  Laterality: N/A;  . ESOPHAGOGASTRODUODENOSCOPY (EGD) WITH PROPOFOL N/A 11/19/2018   Procedure: ESOPHAGOGASTRODUODENOSCOPY (EGD) WITH PROPOFOL;  Surgeon: Lin Landsman, MD;  Location: [facility] ENDOSCOPY;  Service: Gastroenterology;  Laterality: N/A;  . ESOPHAGOGASTRODUODENOSCOPY (EGD) WITH PROPOFOL N/A 03/08/2020   Procedure: ESOPHAGOGASTRODUODENOSCOPY (EGD) WITH PROPOFOL;  Surgeon: Lin Landsman, MD;  Location: [facility] ENDOSCOPY;  Service: Gastroenterology;  Laterality: N/A;  . EYE SURGERY    . GIVENS CAPSULE STUDY N/A 03/29/2020   Procedure: GIVENS CAPSULE STUDY;  Surgeon: Lin Landsman, MD;  Location: [facility] ENDOSCOPY;  Service: Gastroenterology;  Laterality: N/A;  . HEMORRHOID BANDING   03/25/2018   Procedure: HEMORRHOID BANDING;  Surgeon: Lin Landsman, MD;  Location: [facility] ENDOSCOPY;  Service: Gastroenterology;;  . IR EMBO ART  VEN HEMORR LYMPH EXTRAV  INC GUIDE ROADMAPPING  08/07/2018  . IR RADIOLOGIST EVAL & MGMT  09/03/2018  . IR RADIOLOGIST EVAL & MGMT  12/17/2018  . IR RADIOLOGIST EVAL & MGMT  06/24/2019  . IR RADIOLOGIST EVAL & MGMT  01/22/2020  . IR RADIOLOGIST EVAL & MGMT  02/09/2021  . IR TIPS  08/07/2018  . IR TRANSHEPATIC PORTOGRAM W HEMO  03/07/2021  . IR US GUIDE VASC ACCESS RIGHT  03/07/2021  . IR VENO/JUGULAR LEFT  03/07/2021  . PORTA CATH INSERTION N/A 07/30/2020   Procedure: PORTA CATH INSERTION;  Surgeon: Katha Cabal, MD;  Location: [facility] CV LAB;  Service: Cardiovascular;  Laterality: N/A;  . RADIOLOGY WITH ANESTHESIA N/A 08/07/2018   Procedure: TIPS;  Surgeon: Corrie Mckusick, DO;  Location: [place];  Service: Anesthesiology;  Laterality: N/A;  . TONSILLECTOMY      SOCIAL HISTORY:   Social History   Tobacco Use  . Smoking status: Never Smoker  . Smokeless tobacco: Never Used  Substance Use Topics  . Alcohol use: Not Currently    Comment: No EtOH for 30 years.  Never a heavy drinker.    FAMILY HISTORY:   Family History  Problem Relation Age of Onset  .  CAD Father   . Heart attack Father 46  . Cancer Maternal Uncle   . Seizures Mother     DRUG ALLERGIES:  No Known Allergies  REVIEW OF SYSTEMS:   ROS As per history of present illness. All pertinent systems were reviewed above. Constitutional, HEENT, cardiovascular, respiratory, GI, GU, musculoskeletal, neuro, psychiatric, endocrine, integumentary and hematologic systems were reviewed and are otherwise negative/unremarkable except for positive findings mentioned above in the HPI.   MEDICATIONS AT HOME:   Prior to Admission medications   Medication Sig Start Date End Date Taking? Authorizing Provider  acetaminophen (TYLENOL) 500 MG tablet Take 500 mg by mouth daily as needed  for moderate pain or headache.   Yes [provider]  aspirin-sod bicarb-citric acid (ALKA-SELTZER) 325 MG TBEF tablet Take 650 mg by mouth every 6 (six) hours as needed (indigestion).   Yes [provider]  benzonatate (TESSALON) 100 MG capsule Take 100 mg by mouth 4 (four) times daily as needed. 03/22/21  Yes [provider]  cyanocobalamin (,VITAMIN B-12,) 1000 MCG/ML injection Inject 1,000 mcg into the muscle every 30 (thirty) days.    Yes [provider]  hydrOXYzine (ATARAX/VISTARIL) 25 MG tablet Take 25 mg by mouth 2 (two) times daily. 11/21/20  Yes [provider]  lactulose (CHRONULAC) 10 GM/15ML solution TAKE 15 MLS (10 G TOTAL) BY MOUTH 3 (THREE) TIMES DAILY. 03/07/21  Yes Vanga, Tally Due, MD  metFORMIN (GLUCOPHAGE) 500 MG tablet Take 500 mg by mouth daily with breakfast.   Yes [provider]  Misc. Devices KIT Moderate compression hose 20-30 MM HG 10/14/19  Yes Vanga, Tally Due, MD  ondansetron (ZOFRAN) 4 MG tablet Take 4 mg by mouth every 8 (eight) hours as needed for nausea or vomiting.    Yes [provider]  OneTouch Delica Lancets 94W MISC 2 (two) times daily. 12/01/19  Yes [provider]  ONETOUCH ULTRA test strip 2 (two) times daily. 11/23/19  Yes [provider]  pantoprazole (PROTONIX) 40 MG tablet Take 40 mg by mouth 2 (two) times daily. 06/29/19  Yes [provider]  POTASSIUM CHLORIDE PO Take 15 mEq by mouth daily.   Yes [provider]  promethazine-dextromethorphan (PROMETHAZINE-DM) 6.25-15 MG/5ML syrup Take 5 mLs by mouth as needed. 03/22/21  Yes [provider]  sodium bicarbonate 650 MG tablet Take 1,300 mg by mouth 2 (two) times daily. 02/23/21  Yes [provider]  spironolactone (ALDACTONE) 100 MG tablet Take 100 mg by mouth daily.   Yes [provider]  Tetrahydrozoline HCl (VISINE OP) Place 1 drop into both eyes daily as needed (irritaiton).    Yes [provider]  torsemide (DEMADEX) 20 MG tablet TAKE 2 TABLETS BY MOUTH 2 TIMES DAILY. 03/08/21  Yes Loel Dubonnet, NP  Vitamin D, Ergocalciferol, (DRISDOL) 1.25 MG (50000 UNIT) CAPS capsule Take 50,000 Units by mouth every Sunday. 01/14/21  Yes [provider]  XIFAXAN 550 MG TABS tablet Take 550 mg by mouth 2 (two) times daily. 06/24/20  Yes [provider]      VITAL SIGNS:  Blood pressure 140/77, pulse 72, temperature 97.9 F (36.6 C), temperature source Axillary, resp. rate 16, height 5' 2" (1.575 m), weight 88.3 kg, SpO2 100 %.  PHYSICAL EXAMINATION:  Physical Exam  GENERAL:  70 y.o.-year-old Caucasian female patient lying in the bed with no acute distress.  She was restless and delirious moaning and screaming. EYES: Pupils equal, round, reactive to light and accommodation. No  scleral icterus. Extraocular muscles intact.  HEENT: Head atraumatic, normocephalic. Oropharynx and nasopharynx clear.  NECK:  Supple, no jugular venous distention. No thyroid enlargement, no tenderness.  LUNGS: Normal breath sounds bilaterally, no wheezing, rales,rhonchi or crepitation. No use of accessory muscles of respiration.  CARDIOVASCULAR: Regular rate and rhythm, S1, S2 normal. No murmurs, rubs, or gallops.  ABDOMEN: Soft, nondistended, nontender. Bowel sounds present. No organomegaly or mass.  EXTREMITIES: No pedal edema, cyanosis, or clubbing.  NEUROLOGIC: She was moving all 4 extremities.  No lateralizing signs. PSYCHIATRIC: The patient is somnolent, restless and agitated.   SKIN: No obvious rash, lesion, or ulcer.   LABORATORY PANEL:   CBC Recent Labs  Lab 04/04/21 0127  WBC 5.6  HGB 10.9*  HCT 32.7*  PLT 110*   ------------------------------------------------------------------------------------------------------------------  Chemistries  Recent Labs  Lab 04/04/21 0127  NA 136  K 4.0  CL 102  CO2 22  GLUCOSE 162*  BUN 26*  CREATININE 2.07*   CALCIUM 9.0  AST 28  ALT 17  ALKPHOS 141*  BILITOT 1.9*   ------------------------------------------------------------------------------------------------------------------  Cardiac Enzymes No results for input(s): TROPONINI in the last 168 hours. ------------------------------------------------------------------------------------------------------------------  RADIOLOGY:  CT Head Wo Contrast  Result Date: 04/04/2021 CLINICAL DATA:  Altered mental status. EXAM: CT HEAD WITHOUT CONTRAST TECHNIQUE: Contiguous axial images were obtained from the base of the skull through the vertex without intravenous contrast. COMPARISON:  August 20, 2019 FINDINGS: Brain: There is mild cerebral atrophy with widening of the extra-axial spaces and ventricular dilatation. There are areas of decreased attenuation within the white matter tracts of the supratentorial brain, consistent with microvascular disease changes. Vascular: No hyperdense vessel or unexpected calcification. Skull: Normal. Negative for fracture or focal lesion. Sinuses/Orbits: No acute finding. Other: The study is limited secondary to patient motion. IMPRESSION: Limited study, as described above, without and acute intracranial abnormality. Electronically Signed   By: Virgina Norfolk M.D.   On: 04/04/2021 01:10      IMPRESSION AND PLAN:  Active Problems:   Hepatic encephalopathy ([place])  1.  Acute hepatic encephalopathy with liver cirrhosis. - The patient will be admitted to a medical monitored bed. - We will continue p.o. lactulose 4 times daily. - We will follow daily ammonia level. - We will continue Xifaxan - GI consultation will be obtained. -I notified Dr. Jerilynn Mages about the patient.  2.  Type 2 diabetes mellitus with stage III chronic kidney disease. - The patient will be placed on supplemental coverage with NovoLog. - We will hold off metformin.  3.  Compensated diastolic CHF. - We will continue diuresis with p.o. Demadex.  4.   Vitamin B12 deficiency. - We will continue vitamin B12.  5.  GERD without esophagitis. - We will continue PPI therapy.  DVT prophylaxis: Lovenox. Code Status: full code. Family Communication:  The plan of care was discussed in details with the patient (and family). I answered all questions. The patient agreed to proceed with the above mentioned plan. Further management will depend upon hospital course. Disposition Plan: Back to previous home environment Consults called: Gastroenterology consult as mentioned above. All the records are reviewed and case discussed with ED provider.  Status is: Inpatient  Remains inpatient appropriate because:Altered mental status, Ongoing diagnostic testing needed not appropriate for outpatient work up, Unsafe d/c plan, IV treatments appropriate due to intensity of illness or inability to take PO and Inpatient level of care appropriate due to severity of illness   Dispo: The patient is from: Home  Anticipated d/c is to: Home              Patient currently is not medically stable to d/c.   Difficult to place patient No   TOTAL TIME TAKING CARE OF THIS PATIENT: 55 minutes.    Christel Mormon M.D on 04/04/2021 at 3:10 AM  [facility]   From 7 PM-7 AM, contact night-coverage [email or web address]  CC: Primary care physician; Martin Majestic, FNP

## 2021-04-04 NOTE — ED Notes (Signed)
Dr Tyrell Antonio at bedside.

## 2021-04-04 NOTE — Consult Note (Signed)
Lucilla Lame, MD [facility]  [address], [place],  [address] Phone: [phone] Fax : [phone]  Consultation  Referring Provider:     Dr. Sidney Ace Primary Care Physician:  Martin Majestic, FNP Primary Gastroenterologist:  Dr. Marius Ditch         Reason for Consultation:     Hepatic encephalopathy  Date of Admission:  04/04/2021 Date of Consultation:  04/04/2021         HPI:   Crystal [name] is a 70 y.o. female with a known history of cirrhosis.  The patient had been found to have a hepatitis C antibody positive but a negative viral load and has been followed by Dr. Marius Ditch.  The patient has been diagnosed with portal hypertension manifested by ascites esophageal varices and splenomegaly.  The patient had a TIPS procedure and was reported to be controlled on low-sodium diet.  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 The patient came in with a report of being at baseline mental status earlier in the day of admission and had even driven herself earlier that day.  The patient was agitated and restless.  The patient is unable to give any history at the present time and is barely arousable.  The patient was started on lactulose and continued on Xifaxan.  The patient had a rectal tube placed with resulting diarrhea.  Past Medical History:  Diagnosis Date  . Acute GI bleeding 02/09/2018  . Acute upper gastrointestinal bleeding 08/03/2018  . Anemia    TAKES IRON TAB  . Arthritis    SHOULDER  . B12 deficiency 02/18/2018  . Bleeding    GI  3/19  . Bronchitis    HX OF  . CHF (congestive heart failure) ([place])   . Cirrhosis ([place])   . Colon cancer screening   . Diabetes mellitus without complication ([place])    TYPE 2  . Fatty (change of) liver, not elsewhere classified   . Full dentures    UPPER AND LOWER  . Iron deficiency anemia due to chronic blood loss 02/18/2018  . Lung nodule, multiple   .  Pernicious anemia 02/22/2018  . PUD (peptic ulcer disease)   . Raynaud's syndrome   . Upper GI bleeding 08/03/2018    Past Surgical History:  Procedure Laterality Date  . CATARACT EXTRACTION W/PHACO Right 02/06/2018   Procedure: CATARACT EXTRACTION PHACO AND INTRAOCULAR LENS PLACEMENT ([place]) RIGHT DIABETIC;  Surgeon: Leandrew Koyanagi, MD;  Location: [facility];  Service: Ophthalmology;  Laterality: Right;  DIABETIC, ORAL MED  . CATARACT EXTRACTION W/PHACO Left 04/30/2018   Procedure: CATARACT EXTRACTION PHACO AND INTRAOCULAR LENS PLACEMENT ([facility]);  Surgeon: Leandrew Koyanagi, MD;  Location: [facility];  Service: Ophthalmology;  Laterality: Left;  [place] 01:04 AP% 21.3 CDE 13.66 Fluid pack lot # [id number]  . COLONOSCOPY WITH PROPOFOL N/A 03/25/2018   Procedure: COLONOSCOPY WITH PROPOFOL;  Surgeon: Lin Landsman, MD;  Location: [facility] ENDOSCOPY;  Service: Gastroenterology;  Laterality: N/A;  . DENTAL SURGERY     EXTRACTIONS  . ELECTROMAGNETIC NAVIGATION BROCHOSCOPY Right 01/27/2019   Procedure: ELECTROMAGNETIC NAVIGATION BRONCHOSCOPY;  Surgeon: [name], MD;  Location: [facility];  Service: Cardiopulmonary;  Laterality: Right;  . ENTEROSCOPY N/A 07/28/2020   Procedure: Push ENTEROSCOPY;  Surgeon: Lin Landsman, MD;  Location: [facility] ENDOSCOPY;  Service: Gastroenterology;  Laterality: N/A;  . ESOPHAGOGASTRODUODENOSCOPY N/A 08/03/2018   Procedure: ESOPHAGOGASTRODUODENOSCOPY (EGD);  Surgeon: Lin Landsman, MD;  Location: [facility] ENDOSCOPY;  Service: Gastroenterology;  Laterality: N/A;  . ESOPHAGOGASTRODUODENOSCOPY (EGD) WITH PROPOFOL N/A 02/10/2018   Procedure: ESOPHAGOGASTRODUODENOSCOPY (EGD) WITH PROPOFOL;  Surgeon: Jonathon Bellows, MD;  Location: [facility] ENDOSCOPY;  Service: Gastroenterology;  Laterality: N/A;  . ESOPHAGOGASTRODUODENOSCOPY (EGD) WITH PROPOFOL N/A 03/25/2018   Procedure: ESOPHAGOGASTRODUODENOSCOPY (EGD) WITH PROPOFOL;  Surgeon: Lin Landsman, MD;  Location: [facility]  ENDOSCOPY;  Service: Gastroenterology;  Laterality: N/A;  . ESOPHAGOGASTRODUODENOSCOPY (EGD) WITH PROPOFOL N/A 04/22/2018   Procedure: ESOPHAGOGASTRODUODENOSCOPY (EGD) WITH PROPOFOL with band ligation;  Surgeon: Lin Landsman, MD;  Location: [facility];  Service: Gastroenterology;  Laterality: N/A;  . ESOPHAGOGASTRODUODENOSCOPY (EGD) WITH PROPOFOL N/A 06/24/2018   Procedure: ESOPHAGOGASTRODUODENOSCOPY (EGD) WITH PROPOFOL;  Surgeon: Lin Landsman, MD;  Location: [facility] ENDOSCOPY;  Service: Gastroenterology;  Laterality: N/A;  . ESOPHAGOGASTRODUODENOSCOPY (EGD) WITH PROPOFOL N/A 11/19/2018   Procedure: ESOPHAGOGASTRODUODENOSCOPY (EGD) WITH PROPOFOL;  Surgeon: Lin Landsman, MD;  Location: [facility] ENDOSCOPY;  Service: Gastroenterology;  Laterality: N/A;  . ESOPHAGOGASTRODUODENOSCOPY (EGD) WITH PROPOFOL N/A 03/08/2020   Procedure: ESOPHAGOGASTRODUODENOSCOPY (EGD) WITH PROPOFOL;  Surgeon: Lin Landsman, MD;  Location: [facility] ENDOSCOPY;  Service: Gastroenterology;  Laterality: N/A;  . EYE SURGERY    . GIVENS CAPSULE STUDY N/A 03/29/2020   Procedure: GIVENS CAPSULE STUDY;  Surgeon: Lin Landsman, MD;  Location: [facility] ENDOSCOPY;  Service: Gastroenterology;  Laterality: N/A;  . HEMORRHOID BANDING  03/25/2018   Procedure: HEMORRHOID BANDING;  Surgeon: Lin Landsman, MD;  Location: [facility] ENDOSCOPY;  Service: Gastroenterology;;  . IR EMBO ART  VEN HEMORR LYMPH EXTRAV  INC GUIDE ROADMAPPING  08/07/2018  . IR RADIOLOGIST EVAL & MGMT  09/03/2018  . IR RADIOLOGIST EVAL & MGMT  12/17/2018  . IR RADIOLOGIST EVAL & MGMT  06/24/2019  . IR RADIOLOGIST EVAL & MGMT  01/22/2020  . IR RADIOLOGIST EVAL & MGMT  02/09/2021  . IR TIPS  08/07/2018  . IR TRANSHEPATIC PORTOGRAM W HEMO  03/07/2021  . IR US GUIDE VASC ACCESS RIGHT  03/07/2021  . IR VENO/JUGULAR LEFT  03/07/2021  . PORTA CATH INSERTION N/A 07/30/2020   Procedure: PORTA CATH INSERTION;  Surgeon: Katha Cabal, MD;  Location: [facility] CV  LAB;  Service: Cardiovascular;  Laterality: N/A;  . RADIOLOGY WITH ANESTHESIA N/A 08/07/2018   Procedure: TIPS;  Surgeon: Corrie Mckusick, DO;  Location: [facility];  Service: Anesthesiology;  Laterality: N/A;  . TONSILLECTOMY      Prior to Admission medications   Medication Sig Start Date End Date Taking? Authorizing Provider  acetaminophen (TYLENOL) 500 MG tablet Take 500 mg by mouth daily as needed for moderate pain or headache.   Yes [provider]  aspirin-sod bicarb-citric acid (ALKA-SELTZER) 325 MG TBEF tablet Take 650 mg by mouth every 6 (six) hours as needed (indigestion).   Yes [provider]  benzonatate (TESSALON) 100 MG capsule Take 100 mg by mouth 4 (four) times daily as needed. 03/22/21  Yes [provider]  cyanocobalamin (,VITAMIN B-12,) 1000 MCG/ML injection Inject 1,000 mcg into the muscle every 30 (thirty) days.    Yes [provider]  hydrOXYzine (ATARAX/VISTARIL) 25 MG tablet Take 25 mg by mouth 2 (two) times daily. 11/21/20  Yes [provider]  lactulose (CHRONULAC) 10 GM/15ML solution TAKE 15 MLS (10 G TOTAL) BY MOUTH 3 (THREE) TIMES DAILY. 03/07/21  Yes Vanga, Tally Due, MD  metFORMIN (GLUCOPHAGE) 500 MG tablet Take 500 mg by mouth daily with breakfast.   Yes [provider]  Misc. Devices KIT  Moderate compression hose 20-30 MM HG 10/14/19  Yes Vanga, Tally Due, MD  ondansetron (ZOFRAN) 4 MG tablet Take 4 mg by mouth every 8 (eight) hours as needed for nausea or vomiting.    Yes [provider]  OneTouch Delica Lancets 62I MISC 2 (two) times daily. 12/01/19  Yes [provider]  ONETOUCH ULTRA test strip 2 (two) times daily. 11/23/19  Yes [provider]  pantoprazole (PROTONIX) 40 MG tablet Take 40 mg by mouth 2 (two) times daily. 06/29/19  Yes [provider]  POTASSIUM CHLORIDE PO Take 15 mEq by mouth daily.   Yes [provider]  promethazine-dextromethorphan (PROMETHAZINE-DM)  6.25-15 MG/5ML syrup Take 5 mLs by mouth as needed. 03/22/21  Yes [provider]  sodium bicarbonate 650 MG tablet Take 1,300 mg by mouth 2 (two) times daily. 02/23/21  Yes [provider]  spironolactone (ALDACTONE) 100 MG tablet Take 100 mg by mouth daily.   Yes [provider]  Tetrahydrozoline HCl (VISINE OP) Place 1 drop into both eyes daily as needed (irritaiton).   Yes [provider]  torsemide (DEMADEX) 20 MG tablet TAKE 2 TABLETS BY MOUTH 2 TIMES DAILY. 03/08/21  Yes Loel Dubonnet, NP  Vitamin D, Ergocalciferol, (DRISDOL) 1.25 MG (50000 UNIT) CAPS capsule Take 50,000 Units by mouth every Sunday. 01/14/21  Yes [provider]  XIFAXAN 550 MG TABS tablet Take 550 mg by mouth 2 (two) times daily. 06/24/20  Yes [provider]    Family History  Problem Relation Age of Onset  . CAD Father   . Heart attack Father 34  . Cancer Maternal Uncle   . Seizures Mother      Social History   Tobacco Use  . Smoking status: Never Smoker  . Smokeless tobacco: Never Used  Vaping Use  . Vaping Use: Never used  Substance Use Topics  . Alcohol use: Not Currently    Comment: No EtOH for 30 years.  Never a heavy drinker.  . Drug use: Never    Allergies as of 04/04/2021  . (No Known Allergies)    Review of Systems:    All systems reviewed and negative except where noted in HPI.   Physical Exam:  Vital signs in last 24 hours: Temp:  [97.9 F (36.6 C)-98.2 F (36.8 C)] 98 F (36.7 C) (05/23 1400) Pulse Rate:  [64-89] 72 (05/23 1400) Resp:  [13-20] 14 (05/23 1400) BP: (105-169)/(52-77) 133/68 (05/23 1400) SpO2:  [99 %-100 %] 100 % (05/23 1400) Weight:  [88.3 kg-92.1 kg] 92.1 kg (05/23 1126)   General:   Laying in bed minimally responsive Head:  Normocephalic and atraumatic. Eyes:   No icterus.   Conjunctiva pink. PERRLA. Ears:  Normal auditory acuity. Neck:  Supple; no masses or thyroidomegaly Lungs: Respirations even and  unlabored. Lungs clear to auscultation bilaterally.   No wheezes, crackles, or rhonchi.  Heart:  Regular rate and rhythm;  Without murmur, clicks, rubs or gallops Abdomen:  Soft, nondistended, nontender. Normal bowel sounds. No appreciable masses or hepatomegaly.  No rebound or guarding.  Rectal:  Not performed. Msk:  Symmetrical without gross deformities.    Extremities:  Without edema, cyanosis or clubbing. Neurologic: Lethargic Skin:  Intact without significant lesions or rashes. Cervical Nodes:  No significant cervical adenopathy. Psych: Lethargic and unable to open up her eyes  LAB RESULTS: Recent Labs    04/04/21 0127 04/04/21 0622  WBC 5.6 5.9  HGB 10.9* 10.4*  HCT 32.7* 31.3*  PLT 110* 107*   BMET Recent Labs    04/04/21 0127 04/04/21 0622  NA 136 138  K 4.0 4.0  CL 102 103  CO2 22 24  GLUCOSE 162* 150*  BUN 26* 27*  CREATININE 2.07* 2.06*  CALCIUM 9.0 9.0   LFT Recent Labs    04/04/21 0622  PROT 6.2*  ALBUMIN 2.6*  AST 28  ALT 16  ALKPHOS 135*  BILITOT 1.8*   PT/INR No results for input(s): LABPROT, INR in the last 72 hours.  STUDIES: CT Head Wo Contrast  Result Date: 04/04/2021 CLINICAL DATA:  Altered mental status. EXAM: CT HEAD WITHOUT CONTRAST TECHNIQUE: Contiguous axial images were obtained from the base of the skull through the vertex without intravenous contrast. COMPARISON:  August 20, 2019 FINDINGS: Brain: There is mild cerebral atrophy with widening of the extra-axial spaces and ventricular dilatation. There are areas of decreased attenuation within the white matter tracts of the supratentorial brain, consistent with microvascular disease changes. Vascular: No hyperdense vessel or unexpected calcification. Skull: Normal. Negative for fracture or focal lesion. Sinuses/Orbits: No acute finding. Other: The study is limited secondary to patient motion. IMPRESSION: Limited study, as described above, without and acute intracranial abnormality.  Electronically Signed   By: Virgina Norfolk M.D.   On: 04/04/2021 01:10      Impression / Plan:   Assessment: Active Problems:   Hepatic encephalopathy (HCC)   Crystal [name] is a 70 y.o. y/o female with a history of cirrhosis with decompensation and admitted with a diagnosis of hepatic encephalopathy.  The patient's hemoglobin has been stable from her baseline and a GI bleed is unlikely to have caused her to have a decompensation with hepatic cephalopathy.  The most likely causes of decompensation to hepatic encephalopathy are dehydration, infection and GI bleeding.  The patient's white cell count is normal since admission.  Plan:  The patient should be continued on the present treatment for her hepatic encephalopathy.  An occult infection should be considered which is unlikely with the patient's normal white cell count.  It appears that the patient has had a TIPS procedure in the past which can increase the chances of the patient having hepatic encephalopathy.  There is no need to continually check the ammonia since this does not have any value above observing the patient's response to treatment.  We will follow the patient along with you.  Thank you for involving me in the care of this patient.      LOS: 0 days   Lucilla Lame, MD, [facility] 04/04/2021, 2:32 PM,  Pager [phone] 7am-5pm  Check AMION for 5pm -7am coverage and on weekends   Note: This dictation was prepared with Dragon dictation along with smaller phrase technology. Any transcriptional errors that result from this process are unintentional.

## 2021-04-04 NOTE — Progress Notes (Signed)
PROGRESS NOTE    Crystal [name]  JKK:[id number] DOB: 02-20-1951 DOA: 04/04/2021 PCP: Martin Majestic, FNP   Brief Narrative: 70 year old with past medical history significant for liver cirrhosis, CKD stage IV, Chronic Diastolic CHF, type 2 diabetes, peptic ulcer disease, chronic anemia who presents to the emergency department with acute onset of altered mental status, confusion that is started the evening of admission.  Patient became agitated and screaming and restless per family report.  She was given an extra dose of lactulose.  Evaluation in the ED: CT head no acute intracranial abnormalities, alkaline phosphatase 141, bilirubin 1.9, ammonia level of 119. Cr 2.0    Assessment & Plan:   Active Problems:   Hepatic encephalopathy (HCC)  1-Acute Hepatic Encephalopathy:  Cirrhosis of Liver:  Change lactulose to per rectum.  Add ceftriaxone to cover for SBP.  Follow ammonia level.  She was more lethargic this am when cbg was 50. Received amp D 50/.  Resume lasix, spironolactone when taking oral.  GI was consulted.  Also AMS could be related to ativan given in the ED.   2-Diabetes Type 2, correction stage IV CKD> , Hypoglycemia.  Monitor CBG.  Poor oral intake.  Check CBG Q 4 hours.  On D 5 IV fluids.   3-CKD stage IV;  Cr baseline 2. 0 Monitor, stable.   Compensated Chronic Diastolic HF;  Stable. Monitor.   B 12 Deficiency:  Continue with B 12.   GERD;  Continue with PPI.      Estimated body mass index is 35.6 kg/m as calculated from the following:   Height as of this encounter: 5' 2" (1.575 m).   Weight as of this encounter: 88.3 kg.   DVT prophylaxis: Lovenox Code Status: Full code Family Communication: daughter on the phone and another daughter at bedside.  Disposition Plan:  Status is: Inpatient  Remains inpatient appropriate because:IV treatments appropriate due to intensity of illness or inability to take PO   Dispo: The patient is from: Home               Anticipated d/c is to: Home              Patient currently is not medically stable to d/c.   Difficult to place patient No        Consultants:   GI  Procedures:   None  Antimicrobials:  Ceftriaxone  Subjective: Patient is lethargic, moves passively in bed.  She cough at times.  Currently protecting airway.   Objective: Vitals:   04/04/21 0200 04/04/21 0400 04/04/21 0500 04/04/21 0530  BP: 140/77 119/73 140/71 113/64  Pulse: 72 78 68 65  Resp: _0 Temp:      TempSrc:      SpO2: 100% 100% 100% 100%  Weight:      Height:       No intake or output data in the 24 hours ending 04/04/21 0753 Filed Weights   04/04/21 0032  Weight: 88.3 kg    Examination:  General exam: Appears calm and comfortable  Respiratory system: Clear to auscultation. Respiratory effort normal. Cardiovascular system: S1 & S2 heard, RRR. Marland Kitchen Gastrointestinal system: Abdomen is nondistended, soft and nontender. Central nervous system: Lethargic Extremities: no edema    Data Reviewed: I have personally reviewed following labs and imaging studies  CBC: Recent Labs  Lab 04/04/21 0127 04/04/21 0622  WBC 5.6 5.9  NEUTROABS 4.1  --   HGB 10.9* 10.4*  HCT 32.7* 31.3*  MCV 83.0 81.5  PLT 110* 253*   Basic Metabolic Panel: Recent Labs  Lab 04/04/21 0127 04/04/21 0622  NA 136 138  K 4.0 4.0  CL 102 103  CO2 22 24  GLUCOSE 162* 150*  BUN 26* 27*  CREATININE 2.07* 2.06*  CALCIUM 9.0 9.0   GFR: Estimated Creatinine Clearance: 26.6 mL/min (A) (by C-G formula based on SCr of 2.06 mg/dL (H)). Liver Function Tests: Recent Labs  Lab 04/04/21 0127 04/04/21 0622  AST 28 28  ALT 17 16  ALKPHOS 141* 135*  BILITOT 1.9* 1.8*  PROT 6.6 6.2*  ALBUMIN 2.7* 2.6*   No results for input(s): LIPASE, AMYLASE in the last 168 hours. Recent Labs  Lab 04/04/21 0127 04/04/21 0622  AMMONIA 119* 88*   Coagulation Profile: No results for input(s): INR, PROTIME in the last  168 hours. Cardiac Enzymes: No results for input(s): CKTOTAL, CKMB, CKMBINDEX, TROPONINI in the last 168 hours. BNP (last 3 results) No results for input(s): PROBNP in the last 8760 hours. HbA1C: No results for input(s): HGBA1C in the last 72 hours. CBG: No results for input(s): GLUCAP in the last 168 hours. Lipid Profile: No results for input(s): CHOL, HDL, LDLCALC, TRIG, CHOLHDL, LDLDIRECT in the last 72 hours. Thyroid Function Tests: No results for input(s): TSH, T4TOTAL, FREET4, T3FREE, THYROIDAB in the last 72 hours. Anemia Panel: No results for input(s): VITAMINB12, FOLATE, FERRITIN, TIBC, IRON, RETICCTPCT in the last 72 hours. Sepsis Labs: No results for input(s): PROCALCITON, LATICACIDVEN in the last 168 hours.  No results found for this or any previous visit (from the past 240 hour(s)).       Radiology Studies: CT Head Wo Contrast  Result Date: 04/04/2021 CLINICAL DATA:  Altered mental status. EXAM: CT HEAD WITHOUT CONTRAST TECHNIQUE: Contiguous axial images were obtained from the base of the skull through the vertex without intravenous contrast. COMPARISON:  August 20, 2019 FINDINGS: Brain: There is mild cerebral atrophy with widening of the extra-axial spaces and ventricular dilatation. There are areas of decreased attenuation within the white matter tracts of the supratentorial brain, consistent with microvascular disease changes. Vascular: No hyperdense vessel or unexpected calcification. Skull: Normal. Negative for fracture or focal lesion. Sinuses/Orbits: No acute finding. Other: The study is limited secondary to patient motion. IMPRESSION: Limited study, as described above, without and acute intracranial abnormality. Electronically Signed   By: Virgina Norfolk M.D.   On: 04/04/2021 01:10        Scheduled Meds: . [START ON 04/05/2021] cyanocobalamin  1,000 mcg Intramuscular Q30 days  . enoxaparin (LOVENOX) injection  40 mg Subcutaneous Q24H  . hydrOXYzine  25 mg  Oral BID  . lactulose  30 g Oral QID  . lactulose  300 mL Rectal BID  . pantoprazole  40 mg Oral BID  . potassium chloride  20 mEq Oral Daily  . rifaximin  550 mg Oral BID  . sodium bicarbonate  1,300 mg Oral BID  . spironolactone  100 mg Oral Daily  . torsemide  40 mg Oral BID  . [START ON 04/10/2021] Vitamin D (Ergocalciferol)  50,000 Units Oral Q Sun   Continuous Infusions: . sodium chloride 75 mL/hr at 04/04/21 0515     LOS: 0 days    Time spent: 35  Minutes.     Elmarie Shiley, MD [facility]   If 7PM-7AM, please contact night-coverage [email or web address]  04/04/2021, 7:53 AM

## 2021-04-04 NOTE — ED Notes (Signed)
Called pharmacy to clarify rectal lactulose order per Dr Tyrell Antonio recommendation. Pharmacy states that this order is "prebuilt" and this means that this is normally done.

## 2021-04-04 NOTE — ED Provider Notes (Signed)
[facility] Emergency Department Provider Note  ____________________________________________  Time seen: Approximately 2:36 AM  I have reviewed the triage vital signs and the nursing notes.   HISTORY  Chief Complaint Altered Mental Status  Level 5 caveat:  Portions of the history and physical were unable to be obtained due to AMS   HPI Crystal [name] is a 70 y.o. female with a history of cirrhosis of the liver, diabetes, anemia who presents for evaluation of altered mental status.  According to the family patient was last seen normal around 2PM when she drove herself to and from a restaurant to have lunch with her family.  This evening patient started to become more confused.  They given extra dose of lactulose with no improvement.  On arrival patient is screaming and not answering any questions, not following any commands, she is moving all extremities.  Her eyes are closed.   According to the family patient has had no trauma, no vomiting, no fever, she was not complaining of any pain earlier today.  Past Medical History:  Diagnosis Date  . Acute GI bleeding 02/09/2018  . Acute upper gastrointestinal bleeding 08/03/2018  . Anemia    TAKES IRON TAB  . Arthritis    SHOULDER  . B12 deficiency 02/18/2018  . Bleeding    GI  3/19  . Bronchitis    HX OF  . CHF (congestive heart failure) ([place])   . Cirrhosis ([place])   . Colon cancer screening   . Diabetes mellitus without complication ([place])    TYPE 2  . Fatty (change of) liver, not elsewhere classified   . Full dentures    UPPER AND LOWER  . Iron deficiency anemia due to chronic blood loss 02/18/2018  . Lung nodule, multiple   . Pernicious anemia 02/22/2018  . PUD (peptic ulcer disease)   . Raynaud's syndrome   . Upper GI bleeding 08/03/2018    Patient Active Problem List   Diagnosis Date Noted  . Fluid retention 02/21/2021  . Edema 12/09/2020  . Acute on chronic diastolic heart failure ([place]) 12/09/2020  .  Pulmonary nodules 05/02/2020  . Hepatic encephalopathy ([place]) 04/23/2020  . Left ovarian cyst 10/06/2019  . Hypokalemia 08/21/2019  . AMS (altered mental status) 08/20/2019  . Acute osteomyelitis of phalanx of hand ([place]) 06/12/2019  . Pain in finger of right hand 06/04/2019  . Type 2 diabetes mellitus without complication, without long-term current use of insulin ([place]) 04/02/2019  . Aortic atherosclerosis ([place]) 04/02/2019  . Coronary artery disease involving native coronary artery of native heart without angina pectoris 04/02/2019  . Acute on chronic heart failure with preserved ejection fraction (HFpEF) ([place]) 04/02/2019  . Lung nodules   . Volume overload 12/11/2018  . GAVE (gastric antral vascular ectasia)   . 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 Type 2 diabetes mellitus with stage 3 chronic kidney disease ([place]) 08/03/2018  . Hx of esophageal varices   . Cirrhosis of liver without ascites ([place])   . Angiodysplasia of small intestine ([place])   . Adnexal cyst 03/16/2018  . Goals of care, counseling/discussion 03/16/2018  . Right middle lobe pulmonary nodule 03/14/2018  . Right lower lobe pulmonary nodule 02/18/2018  . Iron deficiency anemia due to chronic blood loss 02/18/2018  . B12 deficiency 02/18/2018  . 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 Hepatitis C virus infection without hepatic coma 02/18/2018    Past Surgical History:  Procedure Laterality Date  . CATARACT EXTRACTION  W/PHACO Right 02/06/2018   Procedure: CATARACT EXTRACTION PHACO AND INTRAOCULAR LENS PLACEMENT ([place]) RIGHT DIABETIC;  Surgeon: Leandrew Koyanagi, MD;  Location: [place];  Service: Ophthalmology;  Laterality: Right;  DIABETIC, ORAL MED  . CATARACT EXTRACTION W/PHACO Left 04/30/2018   Procedure: CATARACT EXTRACTION PHACO AND INTRAOCULAR LENS PLACEMENT (IOC);  Surgeon: Leandrew Koyanagi, MD;  Location: [facility]S;  Service: Ophthalmology;  Laterality: Left;   [place] 01:04 AP% 21.3 CDE 13.66 Fluid pack lot # [id number]  . COLONOSCOPY WITH PROPOFOL N/A 03/25/2018   Procedure: COLONOSCOPY WITH PROPOFOL;  Surgeon: Lin Landsman, MD;  Location: [facility] ENDOSCOPY;  Service: Gastroenterology;  Laterality: N/A;  . DENTAL SURGERY     EXTRACTIONS  . ELECTROMAGNETIC NAVIGATION BROCHOSCOPY Right 01/27/2019   Procedure: ELECTROMAGNETIC NAVIGATION BRONCHOSCOPY;  Surgeon: [name], MD;  Location: [facility];  Service: Cardiopulmonary;  Laterality: Right;  . ENTEROSCOPY N/A 07/28/2020   Procedure: Push ENTEROSCOPY;  Surgeon: Lin Landsman, MD;  Location: [facility] ENDOSCOPY;  Service: Gastroenterology;  Laterality: N/A;  . ESOPHAGOGASTRODUODENOSCOPY N/A 08/03/2018   Procedure: ESOPHAGOGASTRODUODENOSCOPY (EGD);  Surgeon: Lin Landsman, MD;  Location: [facility] ENDOSCOPY;  Service: Gastroenterology;  Laterality: N/A;  . ESOPHAGOGASTRODUODENOSCOPY (EGD) WITH PROPOFOL N/A 02/10/2018   Procedure: ESOPHAGOGASTRODUODENOSCOPY (EGD) WITH PROPOFOL;  Surgeon: Jonathon Bellows, MD;  Location: [facility] ENDOSCOPY;  Service: Gastroenterology;  Laterality: N/A;  . ESOPHAGOGASTRODUODENOSCOPY (EGD) WITH PROPOFOL N/A 03/25/2018   Procedure: ESOPHAGOGASTRODUODENOSCOPY (EGD) WITH PROPOFOL;  Surgeon: Lin Landsman, MD;  Location: [facility] ENDOSCOPY;  Service: Gastroenterology;  Laterality: N/A;  . ESOPHAGOGASTRODUODENOSCOPY (EGD) WITH PROPOFOL N/A 04/22/2018   Procedure: ESOPHAGOGASTRODUODENOSCOPY (EGD) WITH PROPOFOL with band ligation;  Surgeon: Lin Landsman, MD;  Location: [place];  Service: Gastroenterology;  Laterality: N/A;  . ESOPHAGOGASTRODUODENOSCOPY (EGD) WITH PROPOFOL N/A 06/24/2018   Procedure: ESOPHAGOGASTRODUODENOSCOPY (EGD) WITH PROPOFOL;  Surgeon: Lin Landsman, MD;  Location: [facility] ENDOSCOPY;  Service: Gastroenterology;  Laterality: N/A;  . ESOPHAGOGASTRODUODENOSCOPY (EGD) WITH PROPOFOL N/A 11/19/2018   Procedure: ESOPHAGOGASTRODUODENOSCOPY (EGD) WITH PROPOFOL;   Surgeon: Lin Landsman, MD;  Location: [facility] ENDOSCOPY;  Service: Gastroenterology;  Laterality: N/A;  . ESOPHAGOGASTRODUODENOSCOPY (EGD) WITH PROPOFOL N/A 03/08/2020   Procedure: ESOPHAGOGASTRODUODENOSCOPY (EGD) WITH PROPOFOL;  Surgeon: Lin Landsman, MD;  Location: [facility] ENDOSCOPY;  Service: Gastroenterology;  Laterality: N/A;  . EYE SURGERY    . GIVENS CAPSULE STUDY N/A 03/29/2020   Procedure: GIVENS CAPSULE STUDY;  Surgeon: Lin Landsman, MD;  Location: [facility] ENDOSCOPY;  Service: Gastroenterology;  Laterality: N/A;  . HEMORRHOID BANDING  03/25/2018   Procedure: HEMORRHOID BANDING;  Surgeon: Lin Landsman, MD;  Location: [facility] ENDOSCOPY;  Service: Gastroenterology;;  . IR EMBO ART  VEN HEMORR LYMPH EXTRAV  INC GUIDE ROADMAPPING  08/07/2018  . IR RADIOLOGIST EVAL & MGMT  09/03/2018  . IR RADIOLOGIST EVAL & MGMT  12/17/2018  . IR RADIOLOGIST EVAL & MGMT  06/24/2019  . IR RADIOLOGIST EVAL & MGMT  01/22/2020  . IR RADIOLOGIST EVAL & MGMT  02/09/2021  . IR TIPS  08/07/2018  . IR TRANSHEPATIC PORTOGRAM W HEMO  03/07/2021  . IR US GUIDE VASC ACCESS RIGHT  03/07/2021  . IR VENO/JUGULAR LEFT  03/07/2021  . PORTA CATH INSERTION N/A 07/30/2020   Procedure: PORTA CATH INSERTION;  Surgeon: Katha Cabal, MD;  Location: [place] CV [facility];  Service: Cardiovascular;  Laterality: N/A;  . RADIOLOGY WITH ANESTHESIA N/A 08/07/2018   Procedure: TIPS;  Surgeon: Corrie Mckusick, DO;  Location: [place];  Service: Anesthesiology;  Laterality: N/A;  .  TONSILLECTOMY      Prior to Admission medications   Medication Sig Start Date End Date Taking? Authorizing Provider  acetaminophen (TYLENOL) 500 MG tablet Take 500 mg by mouth daily as needed for moderate pain or headache.    [provider]  aspirin-sod bicarb-citric acid (ALKA-SELTZER) 325 MG TBEF tablet Take 650 mg by mouth every 6 (six) hours as needed (indigestion).    [provider]  benzonatate (TESSALON) 100 MG capsule Take 100  mg by mouth 4 (four) times daily as needed. 03/22/21   [provider]  cyanocobalamin (,VITAMIN B-12,) 1000 MCG/ML injection Inject 1,000 mcg into the muscle every 30 (thirty) days.     [provider]  hydrOXYzine (ATARAX/VISTARIL) 25 MG tablet Take 25 mg by mouth 2 (two) times daily. 11/21/20   [provider]  lactulose (CHRONULAC) 10 GM/15ML solution TAKE 15 MLS (10 G TOTAL) BY MOUTH 3 (THREE) TIMES DAILY. 03/07/21   Lin Landsman, MD  metFORMIN (GLUCOPHAGE) 500 MG tablet Take 500 mg by mouth daily with breakfast.    [provider]  Misc. Devices KIT Moderate compression hose 20-30 MM HG 10/14/19   Vanga, Tally Due, MD  ondansetron (ZOFRAN) 4 MG tablet Take 4 mg by mouth every 8 (eight) hours as needed for nausea or vomiting.     [provider]  OneTouch Delica Lancets 86V MISC 2 (two) times daily. 12/01/19   [provider]  [facility] ULTRA test strip 2 (two) times daily. 11/23/19   [provider]  pantoprazole (PROTONIX) 40 MG tablet Take 40 mg by mouth 2 (two) times daily. 06/29/19   [provider]  POTASSIUM CHLORIDE PO Take 15 mEq by mouth daily.    [provider]  promethazine-dextromethorphan (PROMETHAZINE-DM) 6.25-15 MG/5ML syrup Take 5 mLs by mouth as needed. 03/22/21   [provider]  sodium bicarbonate 650 MG tablet Take 1,300 mg by mouth 2 (two) times daily. 02/23/21   [provider]  spironolactone (ALDACTONE) 100 MG tablet Take 100 mg by mouth daily.    [provider]  Tetrahydrozoline HCl (VISINE OP) Place 1 drop into both eyes daily as needed (irritaiton).    [provider]  torsemide (DEMADEX) 20 MG tablet TAKE 2 TABLETS BY MOUTH 2 TIMES DAILY. 03/08/21   Loel Dubonnet, NP  Vitamin D, Ergocalciferol, (DRISDOL) 1.25 MG (50000 UNIT) CAPS capsule Take 50,000 Units by mouth every Sunday. 01/14/21   [provider]  XIFAXAN 550 MG TABS tablet Take 550  mg by mouth 2 (two) times daily. 06/24/20   [provider]    Allergies Patient has no known allergies.  Family History  Problem Relation Age of Onset  . CAD Father   . Heart attack Father 27  . Cancer Maternal Uncle   . Seizures Mother     Social History Social History   Tobacco Use  . Smoking status: Never Smoker  . Smokeless tobacco: Never Used  Vaping Use  . Vaping Use: Never used  Substance Use Topics  . Alcohol use: Not Currently    Comment: No EtOH for 30 years.  Never a heavy drinker.  . Drug use: Never    Review of Systems  Constitutional: Negative for fever. + AMS Gastrointestinal: Negative for vomiting or diarrhea.  Level 5 caveat:  Portions of the history and physical were unable to be obtained due to AMS  ____________________________________________   PHYSICAL EXAM:  VITAL SIGNS: ED Triage Vitals  Enc Vitals  Group     BP 04/04/21 0029 (!) 144/61     Pulse Rate 04/04/21 0029 78     Resp 04/04/21 0029 20     Temp 04/04/21 0029 97.9 F (36.6 C)     Temp Source 04/04/21 0029 Axillary     SpO2 04/04/21 0029 99 %     Weight 04/04/21 0032 194 lb 10.7 oz (88.3 kg)     Height 04/04/21 0032 5' 2" (1.575 m)     Head Circumference --      Peak Flow --      Pain Score --      Pain Loc --      Pain Edu? --      Excl. in [place]? --     Constitutional: Eyes closed, screaming "yeah" "woo" "Robert" HEENT:      Head: Normocephalic and atraumatic.         Eyes: Conjunctivae are normal. Sclera is non-icteric.       Mouth/Throat: Mucous membranes are moist.       Neck: Supple with no signs of meningismus. Cardiovascular: Regular rate and rhythm. No murmurs, gallops, or rubs. 2+ symmetrical distal pulses are present in all extremities. No JVD. Respiratory: Normal respiratory effort. Lungs are clear to auscultation bilaterally.  Gastrointestinal: Soft, non tender, and non distended. Musculoskeletal:  No edema, cyanosis, or erythema of  extremities. Neurologic: Face is symmetric, pupils are equal and reactive bilaterally, moving all 4 extremities  skin: Skin is warm, dry and intact. No rash noted.  ____________________________________________   LABS (all labs ordered are listed, but only abnormal results are displayed)  Labs Reviewed  CBC WITH DIFFERENTIAL/PLATELET - Abnormal; Notable for the following components:      Result Value   Hemoglobin 10.9 (*)    HCT 32.7 (*)    RDW 18.8 (*)    Platelets 110 (*)    All other components within normal limits  COMPREHENSIVE METABOLIC PANEL - Abnormal; Notable for the following components:   Glucose, Bld 162 (*)    BUN 26 (*)    Creatinine, Ser 2.07 (*)    Albumin 2.7 (*)    Alkaline Phosphatase 141 (*)    Total Bilirubin 1.9 (*)    GFR, Estimated 25 (*)    All other components within normal limits  AMMONIA - Abnormal; Notable for the following components:   Ammonia 119 (*)    All other components within normal limits  ETHANOL  URINE DRUG SCREEN, QUALITATIVE ([facility] ONLY)  URINALYSIS, COMPLETE (UACMP) WITH MICROSCOPIC   ____________________________________________  EKG  ED ECG REPORT I, Rudene Re, the attending physician, personally viewed and interpreted this ECG.  Sinus rhythm with a rate of 77, borderline prolonged QTC, low voltage QRS, no ST elevations or depressions.  Unchanged from prior. ____________________________________________  RADIOLOGY  I have personally reviewed the images performed during this visit and I agree with the Radiologist's read.   Interpretation by Radiologist:  CT Head Wo Contrast  Result Date: 04/04/2021 CLINICAL DATA:  Altered mental status. EXAM: CT HEAD WITHOUT CONTRAST TECHNIQUE: Contiguous axial images were obtained from the base of the skull through the vertex without intravenous contrast. COMPARISON:  August 20, 2019 FINDINGS: Brain: There is mild cerebral atrophy with widening of the extra-axial spaces and  ventricular dilatation. There are areas of decreased attenuation within the white matter tracts of the supratentorial brain, consistent with microvascular disease changes. Vascular: No hyperdense vessel or unexpected calcification. Skull: Normal. Negative for fracture or focal lesion.  Sinuses/Orbits: No acute finding. Other: The study is limited secondary to patient motion. IMPRESSION: Limited study, as described above, without and acute intracranial abnormality. Electronically Signed   By: Virgina Norfolk M.D.   On: 04/04/2021 01:10     ____________________________________________   PROCEDURES  Procedure(s) performed:yes .1-3 Lead EKG Interpretation Performed by: Rudene Re, MD Authorized by: Rudene Re, MD     Interpretation: non-specific     ECG rate assessment: normal     Rhythm: sinus rhythm     Ectopy: none     Conduction: normal     Critical Care performed: yes  CRITICAL CARE Performed by: Rudene Re  ?  Total critical care time: 30 min  Critical care time was exclusive of separately billable procedures and treating other patients.  Critical care was necessary to treat or prevent imminent or life-threatening deterioration.  Critical care was time spent personally by me on the following activities: development of treatment plan with patient and/or surrogate as well as nursing, discussions with consultants, evaluation of patient's response to treatment, examination of patient, obtaining history from patient or surrogate, ordering and performing treatments and interventions, ordering and review of laboratory studies, ordering and review of radiographic studies, pulse oximetry and re-evaluation of patient's condition.  ____________________________________________   INITIAL IMPRESSION / ASSESSMENT AND PLAN / ED COURSE   70 y.o. female with a history of cirrhosis of the liver, diabetes, anemia who presents for evaluation of altered mental status.   Face is symmetric, pupils are equal and reactive bilaterally, moving all extremities.  Patient keeps her eyes closed, not following commands or answering any questions, screaming different words.  Head CT with no acute findings.  Ammonia elevated at 119 consistent with hepatic encephalopathy.  No fever or meningeal signs.  No signs of trauma.  Platelets and hemoglobin at baseline.  Electrolytes and creatinine at baseline.  Will start patient on lactulose and admit to the hospitalist service.       _____________________________________________ Please note:  Patient was evaluated in Emergency Department today for the symptoms described in the history of present illness. Patient was evaluated in the context of the global COVID-19 pandemic, which necessitated consideration that the patient might be at risk for infection with the SARS-CoV-2 virus that causes COVID-19. Institutional protocols and algorithms that pertain to the evaluation of patients at risk for COVID-19 are in a state of rapid change based on information released by regulatory bodies including the CDC and federal and state organizations. These policies and algorithms were followed during the patient's care in the ED.  Some ED evaluations and interventions may be delayed as a result of limited staffing during the pandemic.   [place] Controlled Substance Database was reviewed by me. ____________________________________________   FINAL CLINICAL IMPRESSION(S) / ED DIAGNOSES   Final diagnoses:  Hepatic encephalopathy ([place])      NEW MEDICATIONS STARTED DURING THIS VISIT:  ED Discharge Orders    None       Note:  This document was prepared using Dragon voice recognition software and may include unintentional dictation errors.    Rudene Re, MD 04/04/21 [phone]

## 2021-04-04 NOTE — ED Notes (Signed)
Unable to obtain morning labs. Will contact phlebotomy for collection.

## 2021-04-04 NOTE — ED Notes (Addendum)
Peri care performed prior to transport.

## 2021-04-04 NOTE — ED Notes (Signed)
Spoke with provider about urine sample. No need to cath patient at this time. Patient frequently calling out "Crystal [name]! Please". Disoriented x4. Significant other at bedside. Bed alarm on.

## 2021-04-04 NOTE — ED Notes (Signed)
Sent msg to Dr Tyrell Antonio to clarify PO meds ordered

## 2021-04-04 NOTE — ED Triage Notes (Signed)
Patient presents from home via [facility] with a cc of altered mental status. Last acting normal around 1400 when patient drove to lunch and ate with her family. Pt family gave extra dose of lactulose which provided no improvement. Alert and oriented x4 at baseline. Disoriented x4 upon arrival. Patient repeatedly stating, "Yeah! And Carlena Bjornstad!  Hx of cirrhosis, hep c, HTN, DM VSS with medic. CBG 178.

## 2021-04-04 NOTE — ED Notes (Signed)
Informed Dr Tyrell Antonio of CBG 54 and asked if wants D50. Sent secure chat and will have Network engineer page as well.

## 2021-04-04 NOTE — ED Notes (Signed)
Patient transported to CT.

## 2021-04-04 NOTE — ED Notes (Signed)
Sent msg to Dr Tyrell Antonio to clarify rectal lactulose order. Lactulose solution that was sent from pharmacy says to dilute with 729m normal saline to make a final amount of 10047mto be given per rectal tube and clamped for 30-60 min. Will wait for response before attempting to administer.

## 2021-04-05 DIAGNOSIS — K7290 Hepatic failure, unspecified without coma: Secondary | ICD-10-CM | POA: Diagnosis not present

## 2021-04-05 LAB — CBC
HCT: 29.4 % — ABNORMAL LOW (ref 36.0–46.0)
Hemoglobin: 9.5 g/dL — ABNORMAL LOW (ref 12.0–15.0)
MCH: 26.1 pg (ref 26.0–34.0)
MCHC: 32.3 g/dL (ref 30.0–36.0)
MCV: 80.8 fL (ref 80.0–100.0)
Platelets: 101 10*3/uL — ABNORMAL LOW (ref 150–400)
RBC: 3.64 MIL/uL — ABNORMAL LOW (ref 3.87–5.11)
RDW: 17.3 % — ABNORMAL HIGH (ref 11.5–15.5)
WBC: 6.7 10*3/uL (ref 4.0–10.5)
nRBC: 0 % (ref 0.0–0.2)

## 2021-04-05 LAB — COMPREHENSIVE METABOLIC PANEL
ALT: 14 U/L (ref 0–44)
AST: 30 U/L (ref 15–41)
Albumin: 2.1 g/dL — ABNORMAL LOW (ref 3.5–5.0)
Alkaline Phosphatase: 105 U/L (ref 38–126)
Anion gap: 9 (ref 5–15)
BUN: 20 mg/dL (ref 8–23)
CO2: 21 mmol/L — ABNORMAL LOW (ref 22–32)
Calcium: 8.1 mg/dL — ABNORMAL LOW (ref 8.9–10.3)
Chloride: 108 mmol/L (ref 98–111)
Creatinine, Ser: 1.53 mg/dL — ABNORMAL HIGH (ref 0.44–1.00)
GFR, Estimated: 37 mL/min — ABNORMAL LOW (ref >60)
Glucose, Bld: 189 mg/dL — ABNORMAL HIGH (ref 70–99)
Potassium: 3.4 mmol/L — ABNORMAL LOW (ref 3.5–5.1)
Sodium: 138 mmol/L (ref 135–145)
Total Bilirubin: 1.7 mg/dL — ABNORMAL HIGH (ref 0.3–1.2)
Total Protein: 5.5 g/dL — ABNORMAL LOW (ref 6.5–8.1)

## 2021-04-05 LAB — GLUCOSE, CAPILLARY
Glucose-Capillary: 73 mg/dL (ref 70–99)
Glucose-Capillary: 124 mg/dL — ABNORMAL HIGH (ref 70–99)
Glucose-Capillary: 159 mg/dL — ABNORMAL HIGH (ref 70–99)
Glucose-Capillary: 152 mg/dL — ABNORMAL HIGH (ref 70–99)
Glucose-Capillary: 159 mg/dL — ABNORMAL HIGH (ref 70–99)

## 2021-04-05 LAB — AMMONIA: Ammonia: 59 umol/L — ABNORMAL HIGH (ref 9–35)

## 2021-04-05 MED ORDER — LACTULOSE 10 GM/15ML PO SOLN
20.0000 g | Freq: Three times a day (TID) | ORAL | Status: DC
  Administered 2021-04-05 – 2021-04-07 (×7): 20 g via ORAL
  Filled 2021-04-05 (×7): qty 30

## 2021-04-05 NOTE — Progress Notes (Addendum)
Crystal Lame, MD [facility]   [address] [place], [place] [address] Phone: [phone] Fax : [phone]   Subjective: The patient is much more awake this morning and had finished her lunch.  There is no complaints of the present time.  She is less confused. She denies any abdominal pain nausea vomiting fevers chills.   Objective: Vital signs in last 24 hours: Vitals:   04/05/21 1400 04/05/21 1500 04/05/21 1600 04/05/21 1700  BP: 134/68 127/66 (!) 129/58 (!) 141/72  Pulse: 82 72 70 81  Resp:   17 20  Temp:      TempSrc:      SpO2: 100% 100% 99% 100%  Weight:      Height:       Weight change: 3.8 kg  Intake/Output Summary (Last 24 hours) at 04/05/2021 1741 Last data filed at 04/05/2021 1557 Gross per 24 hour  Intake 365.73 ml  Output 1900 ml  Net -1534.27 ml     Exam: Heart:: Regular rate and rhythm, S1S2 present or without murmur or extra heart sounds Lungs: normal and clear to auscultation and percussion Abdomen: soft, nontender, normal bowel sounds   Lab Results: _0 @ Micro Results: Recent Results (from the past 240 hour(s))  SARS CORONAVIRUS 2 (TAT 6-24 HRS) Nasopharyngeal Nasopharyngeal Swab     Status: None   Collection Time: 04/04/21  4:06 AM   Specimen: Nasopharyngeal Swab  Result Value Ref Range Status   SARS Coronavirus 2 NEGATIVE NEGATIVE Final    Comment: (NOTE) SARS-CoV-2 target nucleic acids are NOT DETECTED.  The SARS-CoV-2 RNA is generally detectable in upper and lower respiratory specimens during the acute phase of infection. Negative results do not preclude SARS-CoV-2 infection, do not rule out co-infections with other pathogens, and should not be used as the sole basis for treatment or other patient management decisions. Negative results must be combined with clinical observations, patient history, and epidemiological information. The expected result is Negative.  Fact Sheet for  Patients: SugarRoll.be  Fact Sheet for Healthcare Providers: https://www.woods-mathews.com/  This test is not yet approved or cleared by the [place] FDA and  has been authorized for detection and/or diagnosis of SARS-CoV-2 by FDA under an Emergency Use Authorization (EUA). This EUA will remain  in effect (meaning this test can be used) for the duration of the COVID-19 declaration under Se ction 564(b)(1) of the Act, 21 U.S.C. section 360bbb-3(b)(1), unless the authorization is terminated or revoked sooner.  Performed at [facility], [place] [address] [place], [place] [address]   MRSA PCR Screening     Status: None   Collection Time: 04/04/21 11:31 AM   Specimen: Nasopharyngeal  Result Value Ref Range Status   MRSA by PCR NEGATIVE NEGATIVE Final    Comment:        The GeneXpert MRSA Assay (FDA approved for NASAL specimens only), is one component of a comprehensive MRSA colonization surveillance program. It is not intended to diagnose MRSA infection nor to guide or monitor treatment for MRSA infections. Performed at [facility], [address] [place], [place] [address]    Studies/Results: CT Head Wo Contrast  Result Date: 04/04/2021 CLINICAL DATA:  Altered mental status. EXAM: CT HEAD WITHOUT CONTRAST TECHNIQUE: Contiguous axial images were obtained from the base of the skull through the vertex without intravenous contrast. COMPARISON:  August 20, 2019 FINDINGS: Brain: There is mild cerebral atrophy with widening of the extra-axial spaces and ventricular dilatation. There are areas of decreased attenuation within  the white matter tracts of the supratentorial brain, consistent with microvascular disease changes. Vascular: No hyperdense vessel or unexpected calcification. Skull: Normal. Negative for fracture or focal lesion. Sinuses/Orbits: No acute finding. Other: The study is limited secondary to patient motion.  IMPRESSION: Limited study, as described above, without and acute intracranial abnormality. Electronically Signed   By: Virgina Norfolk M.D.   On: 04/04/2021 01:10   Medications: I have reviewed the patient's current medications. Scheduled Meds: . Chlorhexidine Gluconate Cloth  6 each Topical Q0600  . cyanocobalamin  1,000 mcg Intramuscular Q30 days  . enoxaparin (LOVENOX) injection  40 mg Subcutaneous Q24H  . lactulose  20 g Oral TID  . pantoprazole  40 mg Oral BID  . potassium chloride  20 mEq Oral Daily  . rifaximin  550 mg Oral BID  . sodium bicarbonate  1,300 mg Oral BID  . spironolactone  100 mg Oral Daily  . torsemide  40 mg Oral BID  . [START ON 04/10/2021] Vitamin D (Ergocalciferol)  50,000 Units Oral Q Sun   Continuous Infusions: . cefTRIAXone (ROCEPHIN)  IV 2 g (04/05/21 1507)   PRN Meds:.acetaminophen **OR** acetaminophen, benzonatate, magnesium hydroxide, ondansetron **OR** ondansetron (ZOFRAN) IV, sodium chloride flush, traZODone   Assessment: Active Problems:   Hepatic encephalopathy ([name])    Plan: This patient came in with acute hepatic encephalopathy due to cirrhosis of the liver.  The patient is followed by Dr. Marius Ditch.  The patient is waking up while being treated with lactulose.  No further GI recommendations are needed on this patient who is rapidly improving.  The patient should follow up with Dr. Marius Ditch as an outpatient. The patient has been explained the plan and agrees with it.  I will sign off.  Please call if any further GI concerns or questions.  We would like to thank you for the opportunity to participate in the care of [name].     LOS: 1 day   Lewayne Bunting 04/05/2021, 5:41 PM Pager [phone] 7am-5pm  Check AMION for 5pm -7am coverage and on weekends

## 2021-04-05 NOTE — Progress Notes (Addendum)
PROGRESS NOTE    Crystal [name]  DVV:[id number] DOB: [birth year] DOA: 04/04/2021 PCP: Martin Majestic, FNP   Brief Narrative: 70 year old with past medical history significant for liver cirrhosis, CKD stage IV, Chronic Diastolic CHF, type 2 diabetes, peptic ulcer disease, chronic anemia who presents to the emergency department with acute onset of altered mental status, confusion that is started the evening of admission.  Patient became agitated and screaming and restless per family report.  She was given an extra dose of lactulose.  Evaluation in the ED: CT head no acute intracranial abnormalities, alkaline phosphatase 141, bilirubin 1.9, ammonia level of 119. Cr 2.0   Patient was admitted with acute hepatic encephalopathy.   Assessment & Plan:   Active Problems:   Hepatic encephalopathy (H[facility])  1-Acute Hepatic Encephalopathy:  Cirrhosis of Liver:  Continue with  ceftriaxone to cover for SBP.  Ammonia down to 593. Hypoglycemia; received amp D 50.  Resume torsemide,  spironolactone.  GI was consulted. Recommend continue with lactulose.  Also AMS could be related to ativan given in the ED.  -She is more alert today, answering question, not at baseline. Resume oral lactulose 20 g TID. Discontinue lactulose pre rectum.   2-Diabetes Type 2, correction stage IV CKD> , Hypoglycemia.  Monitor CBG.  Poor oral intake.  Check CBG Q 4 hours.  On D 5 IV fluids.  Plan to discontinue fluids.   3-CKD stage IV;  Cr baseline 2. 0 Monitor, stable.  Cr down to 1.5 Resume spironolactone and lasix, monitor renal function.   Compensated Chronic Diastolic HF;  Stable. Monitor.  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   GERD;  Continue with PPI.      Estimated body mass index is 37.14 kg/m as calculated from the following:   Height as of this encounter: 5' 2" (1.575 m).   Weight as of this encounter: 92.1 kg.   DVT prophylaxis: Lovenox Code Status: Full  code Family Communication: Daughter at bedside 5/24.  Disposition Plan:  Status is: Inpatient  Remains inpatient appropriate because:IV treatments appropriate due to intensity of illness or inability to take PO   Dispo: The patient is from: Home              Anticipated d/c is to: Home              Patient currently is not medically stable to d/c.   Difficult to place patient No        Consultants:   GI  Procedures:   None  Antimicrobials:  Ceftriaxone  Subjective: She is more alert, answer questions. Still fall sleep.  She denies abdominal pain, dysuria.   Objective: Vitals:   04/05/21 1100 04/05/21 1200 04/05/21 1300 04/05/21 1400  BP: (!) 126/48 137/81 135/66 134/68  Pulse: 71 66 71 82  Resp: 20     Temp:  98.5 F (36.9 C)    TempSrc:  Oral    SpO2: 100% 100% 100% 100%  Weight:      Height:        Intake/Output Summary (Last 24 hours) at 04/05/2021 1455 Last data filed at 04/05/2021 1402 Gross per 24 hour  Intake 340 ml  Output 1200 ml  Net -860 ml   Filed Weights   04/04/21 0032 04/04/21 1126  Weight: 88.3 kg 92.1 kg    Examination:  General exam: Alert, follows command Respiratory system: CTA Cardiovascular system: S 1, S 2 RRR. Marland Kitchen Gastrointestinal system:  BS present, soft, nt, nd obese.  Central nervous system: More alert, follows command, not at baseline.  Extremities: trace edema, chronic cyanosis finger.     Data Reviewed: I have personally reviewed following labs and imaging studies  CBC: Recent Labs  Lab 04/04/21 0127 04/04/21 0622 04/05/21 1031  WBC 5.6 5.9 6.7  NEUTROABS 4.1  --   --   HGB 10.9* 10.4* 9.5*  HCT 32.7* 31.3* 29.4*  MCV 83.0 81.5 80.8  PLT 110* 107* 119*   Basic Metabolic Panel: Recent Labs  Lab 04/04/21 0127 04/04/21 0622 04/05/21 1031  NA 136 138 138  K 4.0 4.0 3.4*  CL 102 103 108  CO2 22 24 21*  GLUCOSE 162* 150* 189*  BUN 26* 27* 20  CREATININE 2.07* 2.06* 1.53*  CALCIUM 9.0 9.0 8.1*    GFR: Estimated Creatinine Clearance: 36.7 mL/min (A) (by C-G formula based on SCr of 1.53 mg/dL (H)). Liver Function Tests: Recent Labs  Lab 04/04/21 0127 04/04/21 0622 04/05/21 1031  AST _0 ALT _1 ALKPHOS 141* 135* 105  BILITOT 1.9* 1.8* 1.7*  PROT 6.6 6.2* 5.5*  ALBUMIN 2.7* 2.6* 2.1*   No results for input(s): LIPASE, AMYLASE in the last 168 hours. Recent Labs  Lab 04/04/21 0127 04/04/21 0622 04/05/21 0500  AMMONIA 119* 88* 59*   Coagulation Profile: No results for input(s): INR, PROTIME in the last 168 hours. Cardiac Enzymes: No results for input(s): CKTOTAL, CKMB, CKMBINDEX, TROPONINI in the last 168 hours. BNP (last 3 results) No results for input(s): PROBNP in the last 8760 hours. HbA1C: No results for input(s): HGBA1C in the last 72 hours. CBG: Recent Labs  Lab 04/04/21 1314 04/04/21 1546 04/04/21 2101 04/05/21 0805 04/05/21 1151  GLUCAP 78 75 78 73 124*   Lipid Profile: No results for input(s): CHOL, HDL, LDLCALC, TRIG, CHOLHDL, LDLDIRECT in the last 72 hours. Thyroid Function Tests: No results for input(s): TSH, T4TOTAL, FREET4, T3FREE, THYROIDAB in the last 72 hours. Anemia Panel: No results for input(s): VITAMINB12, FOLATE, FERRITIN, TIBC, IRON, RETICCTPCT in the last 72 hours. Sepsis Labs: No results for input(s): PROCALCITON, LATICACIDVEN in the last 168 hours.  Recent Results (from the past 240 hour(s))  SARS CORONAVIRUS 2 (TAT 6-24 HRS) Nasopharyngeal Nasopharyngeal Swab     Status: None   Collection Time: 04/04/21  4:06 AM   Specimen: Nasopharyngeal Swab  Result Value Ref Range Status   SARS Coronavirus 2 NEGATIVE NEGATIVE Final    Comment: (NOTE) SARS-CoV-2 target nucleic acids are NOT DETECTED.  The SARS-CoV-2 RNA is generally detectable in upper and lower respiratory specimens during the acute phase of infection. Negative results do not preclude SARS-CoV-2 infection, do not rule out co-infections with other pathogens,  and should not be used as the sole basis for treatment or other patient management decisions. Negative results must be combined with clinical observations, patient history, and epidemiological information. The expected result is Negative.  Fact Sheet for Patients: SugarRoll.be  Fact Sheet for Healthcare Providers: https://www.woods-mathews.com/  This test is not yet approved or cleared by the [place] FDA and  has been authorized for detection and/or diagnosis of SARS-CoV-2 by FDA under an Emergency Use Authorization (EUA). This EUA will remain  in effect (meaning this test can be used) for the duration of the COVID-19 declaration under Se ction 564(b)(1) of the Act, 21 U.S.C. section 360bbb-3(b)(1), unless the authorization is terminated or revoked sooner.  Performed at [facility], [place]  [name] [place], [place] [address]   MRSA PCR Screening     Status: None   Collection Time: 04/04/21 11:31 AM   Specimen: Nasopharyngeal  Result Value Ref Range Status   MRSA by PCR NEGATIVE NEGATIVE Final    Comment:        The GeneXpert MRSA Assay (FDA approved for NASAL specimens only), is one component of a comprehensive MRSA colonization surveillance program. It is not intended to diagnose MRSA infection nor to guide or monitor treatment for MRSA infections. Performed at [facility], [address] [place], [place] [address]          Radiology Studies: CT Head Wo Contrast  Result Date: 04/04/2021 CLINICAL DATA:  Altered mental status. EXAM: CT HEAD WITHOUT CONTRAST TECHNIQUE: Contiguous axial images were obtained from the base of the skull through the vertex without intravenous contrast. COMPARISON:  August 20, 2019 FINDINGS: Brain: There is mild cerebral atrophy with widening of the extra-axial spaces and ventricular dilatation. There are areas of decreased attenuation within the white matter tracts of the  supratentorial brain, consistent with microvascular disease changes. Vascular: No hyperdense vessel or unexpected calcification. Skull: Normal. Negative for fracture or focal lesion. Sinuses/Orbits: No acute finding. Other: The study is limited secondary to patient motion. IMPRESSION: Limited study, as described above, without and acute intracranial abnormality. Electronically Signed   By: Virgina Norfolk M.D.   On: 04/04/2021 01:10        Scheduled Meds: . Chlorhexidine Gluconate Cloth  6 each Topical Q0600  . cyanocobalamin  1,000 mcg Intramuscular Q30 days  . enoxaparin (LOVENOX) injection  40 mg Subcutaneous Q24H  . lactulose  20 g Oral TID  . pantoprazole  40 mg Oral BID  . potassium chloride  20 mEq Oral Daily  . rifaximin  550 mg Oral BID  . sodium bicarbonate  1,300 mg Oral BID  . spironolactone  100 mg Oral Daily  . torsemide  40 mg Oral BID  . [START ON 04/10/2021] Vitamin D (Ergocalciferol)  50,000 Units Oral Q Sun   Continuous Infusions: . cefTRIAXone (ROCEPHIN)  IV 2 g (04/04/21 1750)  . dextrose 5% lactated ringers Stopped (04/04/21 1118)     LOS: 1 day    Time spent: 35  Minutes.     Elmarie Shiley, MD [facility]   If 7PM-7AM, please contact night-coverage [email or web address]  04/05/2021, 2:55 PM

## 2021-04-06 ENCOUNTER — Other Ambulatory Visit: Payer: Self-pay

## 2021-04-06 DIAGNOSIS — N184 Chronic kidney disease, stage 4 (severe): Secondary | ICD-10-CM

## 2021-04-06 DIAGNOSIS — K219 Gastro-esophageal reflux disease without esophagitis: Secondary | ICD-10-CM | POA: Diagnosis not present

## 2021-04-06 DIAGNOSIS — K7290 Hepatic failure, unspecified without coma: Secondary | ICD-10-CM | POA: Diagnosis not present

## 2021-04-06 DIAGNOSIS — I5032 Chronic diastolic (congestive) heart failure: Secondary | ICD-10-CM | POA: Diagnosis not present

## 2021-04-06 LAB — COMPREHENSIVE METABOLIC PANEL
ALT: 14 U/L (ref 0–44)
AST: 26 U/L (ref 15–41)
Albumin: 1.9 g/dL — ABNORMAL LOW (ref 3.5–5.0)
Alkaline Phosphatase: 105 U/L (ref 38–126)
Anion gap: 5 (ref 5–15)
BUN: 19 mg/dL (ref 8–23)
CO2: 25 mmol/L (ref 22–32)
Calcium: 8.2 mg/dL — ABNORMAL LOW (ref 8.9–10.3)
Chloride: 109 mmol/L (ref 98–111)
Creatinine, Ser: 1.58 mg/dL — ABNORMAL HIGH (ref 0.44–1.00)
GFR, Estimated: 35 mL/min — ABNORMAL LOW (ref >60)
Glucose, Bld: 181 mg/dL — ABNORMAL HIGH (ref 70–99)
Potassium: 3.3 mmol/L — ABNORMAL LOW (ref 3.5–5.1)
Sodium: 139 mmol/L (ref 135–145)
Total Bilirubin: 1.4 mg/dL — ABNORMAL HIGH (ref 0.3–1.2)
Total Protein: 4.9 g/dL — ABNORMAL LOW (ref 6.5–8.1)

## 2021-04-06 LAB — CBC
HCT: 28.5 % — ABNORMAL LOW (ref 36.0–46.0)
Hemoglobin: 9.3 g/dL — ABNORMAL LOW (ref 12.0–15.0)
MCH: 26.8 pg (ref 26.0–34.0)
MCHC: 32.6 g/dL (ref 30.0–36.0)
MCV: 82.1 fL (ref 80.0–100.0)
Platelets: 96 10*3/uL — ABNORMAL LOW (ref 150–400)
RBC: 3.47 MIL/uL — ABNORMAL LOW (ref 3.87–5.11)
RDW: 17.7 % — ABNORMAL HIGH (ref 11.5–15.5)
WBC: 7.2 10*3/uL (ref 4.0–10.5)
nRBC: 0 % (ref 0.0–0.2)

## 2021-04-06 LAB — GLUCOSE, CAPILLARY
Glucose-Capillary: 169 mg/dL — ABNORMAL HIGH (ref 70–99)
Glucose-Capillary: 150 mg/dL — ABNORMAL HIGH (ref 70–99)
Glucose-Capillary: 189 mg/dL — ABNORMAL HIGH (ref 70–99)
Glucose-Capillary: 118 mg/dL — ABNORMAL HIGH (ref 70–99)
Glucose-Capillary: 199 mg/dL — ABNORMAL HIGH (ref 70–99)

## 2021-04-06 LAB — HEMOGLOBIN A1C
Hgb A1c MFr Bld: 6.7 % — ABNORMAL HIGH (ref 4.8–5.6)
Mean Plasma Glucose: 146 mg/dL

## 2021-04-06 LAB — MAGNESIUM: Magnesium: 1.9 mg/dL (ref 1.7–2.4)

## 2021-04-06 MED ORDER — INSULIN ASPART 100 UNIT/ML IJ SOLN
0.0000 [IU] | Freq: Three times a day (TID) | INTRAMUSCULAR | Status: DC
  Administered 2021-04-07 (×2): 2 [IU] via SUBCUTANEOUS
  Administered 2021-04-07: 3 [IU] via SUBCUTANEOUS
  Administered 2021-04-08: 2 [IU] via SUBCUTANEOUS
  Administered 2021-04-08: 09:00:00 1 [IU] via SUBCUTANEOUS
  Filled 2021-04-06 (×6): qty 1

## 2021-04-06 MED ORDER — POTASSIUM CHLORIDE CRYS ER 20 MEQ PO TBCR
40.0000 meq | EXTENDED_RELEASE_TABLET | Freq: Once | ORAL | Status: AC
  Administered 2021-04-06: 40 meq via ORAL
  Filled 2021-04-06: qty 2

## 2021-04-06 MED ORDER — INSULIN ASPART 100 UNIT/ML IJ SOLN
0.0000 [IU] | Freq: Every day | INTRAMUSCULAR | Status: DC
  Administered 2021-04-07: 2 [IU] via SUBCUTANEOUS
  Filled 2021-04-06: qty 1

## 2021-04-06 MED ORDER — OXYCODONE HCL 5 MG PO TABS
5.0000 mg | ORAL_TABLET | ORAL | Status: DC | PRN
  Administered 2021-04-06 – 2021-04-08 (×4): 5 mg via ORAL
  Filled 2021-04-06 (×4): qty 1

## 2021-04-06 NOTE — Progress Notes (Signed)
04/06/21 1300  Clinical Encounter Type  Visited With Patient and family together  Visit Type Initial;Spiritual support;Social support  Referral From Chaplain  Consult/Referral To [employer] visited PT & family member while doing rounds. PT was actively eat lunch . Chaplain stayed briefly and ministered with presence. PT requested prayer. Chaplain offered words of prayer, and told PT he would follow back up at another time.

## 2021-04-06 NOTE — Progress Notes (Signed)
PROGRESS NOTE    Crystal [name]  MVH:[id number] DOB: February 24, 1951 DOA: 04/04/2021 PCP: Martin Majestic, FNP   Brief Narrative: 70 year old with past medical history significant for liver cirrhosis, CKD stage IV, Chronic Diastolic CHF, type 2 diabetes, peptic ulcer disease, chronic anemia who presents to the emergency department with acute onset of altered mental status, confusion that is started the evening of admission.  Patient became agitated and screaming and restless per family report.  She was given an extra dose of lactulose.  Evaluation in the ED: CT head no acute intracranial abnormalities, alkaline phosphatase 141, bilirubin 1.9, ammonia level of 119. Cr 2.0   Patient was admitted with acute hepatic encephalopathy.   Assessment & Plan:   Active Problems:   Hepatic encephalopathy (HCC)  1-Acute Hepatic Encephalopathy:  Cirrhosis of Liver:  Continue with  ceftriaxone to cover for SBP.  Ammonia improved 119->59 Continue torsemide,  spironolactone.  GI was consulted. Recommend continue with lactulose.  Mental status continues to improve, although not back to baseline yet Continue lactulose and xifaxan  2-Diabetes Type 2, correction stage IV CKD> , Hypoglycemia.  Monitor CBG with SSI  Poor oral intake.  Continue to follow blood sugars  3-CKD stage IV;  Cr baseline 2. 0 Monitor, stable.  Cr down to 1.5 Resume spironolactone and lasix, monitor renal function.   Compensated Chronic Diastolic HF;  Stable. Monitor.  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   GERD;  Continue with PPI.      Estimated body mass index is 37.14 kg/m as calculated from the following:   Height as of this encounter: 5' 2" (1.575 m).   Weight as of this encounter: 92.1 kg.   DVT prophylaxis: Lovenox Code Status: Full code Family Communication: Daughter at bedside 5/24.  Disposition Plan:  Status is: Inpatient  Remains inpatient appropriate because:IV  treatments appropriate due to intensity of illness or inability to take PO   Dispo: The patient is from: Home              Anticipated d/c is to: Home              Patient currently is not medically stable to d/c.   Difficult to place patient No        Consultants:   GI  Procedures:   None  Antimicrobials:  Ceftriaxone  Subjective: Still somewhat sleepy, but answering questions Complains of pain in her right foot. Denies any trauma  Objective: Vitals:   04/05/21 2000 04/06/21 0000 04/06/21 0400 04/06/21 0800  BP: 126/78 117/67 119/60 122/64  Pulse: (!) 125 84 94 87  Resp: _0 Temp: 98.6 F (37 C) 98.5 F (36.9 C) 98.2 F (36.8 C) 98.5 F (36.9 C)  TempSrc: Axillary Oral  Oral  SpO2: 100% 99% 100% 99%  Weight:      Height:        Intake/Output Summary (Last 24 hours) at 04/06/2021 1218 Last data filed at 04/06/2021 0200 Gross per 24 hour  Intake 265.73 ml  Output 1400 ml  Net -1134.27 ml   Filed Weights   04/04/21 0032 04/04/21 1126  Weight: 88.3 kg 92.1 kg    Examination:  General exam: wakes up to voice and answers questions appropriately Respiratory system: Clear to auscultation. Respiratory effort normal. Cardiovascular system:RRR. No murmurs, rubs, gallops. Gastrointestinal system: Abdomen is nondistended, soft and nontender. No organomegaly or masses felt. Normal bowel sounds heard. Central nervous system:  Alert and oriented. No focal neurological deficits, no asterixis Extremities: No C/C/E, +pedal pulses Skin: No rashes, lesions or ulcers Psychiatry: Judgement and insight appear normal. Mood & affect appropriate.      Data Reviewed: I have personally reviewed following labs and imaging studies  CBC: Recent Labs  Lab 04/04/21 0127 04/04/21 0622 04/05/21 1031 04/06/21 0450  WBC 5.6 5.9 6.7 7.2  NEUTROABS 4.1  --   --   --   HGB 10.9* 10.4* 9.5* 9.3*  HCT 32.7* 31.3* 29.4* 28.5*  MCV 83.0 81.5 80.8 82.1  PLT 110* 107*  101* 96*   Basic Metabolic Panel: Recent Labs  Lab 04/04/21 0127 04/04/21 0622 04/05/21 1031 04/06/21 0450  NA 136 138 138 139  K 4.0 4.0 3.4* 3.3*  CL 102 103 108 109  CO2 22 24 21* 25  GLUCOSE 162* 150* 189* 181*  BUN 26* 27* 20 19  CREATININE 2.07* 2.06* 1.53* 1.58*  CALCIUM 9.0 9.0 8.1* 8.2*   GFR: Estimated Creatinine Clearance: 35.5 mL/min (A) (by C-G formula based on SCr of 1.58 mg/dL (H)). Liver Function Tests: Recent Labs  Lab 04/04/21 0127 04/04/21 0622 04/05/21 1031 04/06/21 0450  AST _0 ALT _1 ALKPHOS 141* 135* 105 105  BILITOT 1.9* 1.8* 1.7* 1.4*  PROT 6.6 6.2* 5.5* 4.9*  ALBUMIN 2.7* 2.6* 2.1* 1.9*   No results for input(s): LIPASE, AMYLASE in the last 168 hours. Recent Labs  Lab 04/04/21 0127 04/04/21 0622 04/05/21 0500  AMMONIA 119* 88* 59*   Coagulation Profile: No results for input(s): INR, PROTIME in the last 168 hours. Cardiac Enzymes: No results for input(s): CKTOTAL, CKMB, CKMBINDEX, TROPONINI in the last 168 hours. BNP (last 3 results) No results for input(s): PROBNP in the last 8760 hours. HbA1C: No results for input(s): HGBA1C in the last 72 hours. CBG: Recent Labs  Lab 04/05/21 1910 04/05/21 2322 04/06/21 0358 04/06/21 0751 04/06/21 1213  GLUCAP 152* 159* 169* 150* 189*   Lipid Profile: No results for input(s): CHOL, HDL, LDLCALC, TRIG, CHOLHDL, LDLDIRECT in the last 72 hours. Thyroid Function Tests: No results for input(s): TSH, T4TOTAL, FREET4, T3FREE, THYROIDAB in the last 72 hours. Anemia Panel: No results for input(s): VITAMINB12, FOLATE, FERRITIN, TIBC, IRON, RETICCTPCT in the last 72 hours. Sepsis Labs: No results for input(s): PROCALCITON, LATICACIDVEN in the last 168 hours.  Recent Results (from the past 240 hour(s))  SARS CORONAVIRUS 2 (TAT 6-24 HRS) Nasopharyngeal Nasopharyngeal Swab     Status: None   Collection Time: 04/04/21  4:06 AM   Specimen: Nasopharyngeal Swab  Result Value Ref  Range Status   SARS Coronavirus 2 NEGATIVE NEGATIVE Final    Comment: (NOTE) SARS-CoV-2 target nucleic acids are NOT DETECTED.  The SARS-CoV-2 RNA is generally detectable in upper and lower respiratory specimens during the acute phase of infection. Negative results do not preclude SARS-CoV-2 infection, do not rule out co-infections with other pathogens, and should not be used as the sole basis for treatment or other patient management decisions. Negative results must be combined with clinical observations, patient history, and epidemiological information. The expected result is Negative.  Fact Sheet for Patients: SugarRoll.be  Fact Sheet for Healthcare Providers: https://www.woods-mathews.com/  This test is not yet approved or cleared by the [place] FDA and  has been authorized for detection and/or diagnosis of SARS-CoV-2 by FDA under an Emergency Use Authorization (EUA). This EUA will remain  in effect (meaning this test can be used)  for the duration of the COVID-19 declaration under Se ction 564(b)(1) of the Act, 21 U.S.C. section 360bbb-3(b)(1), unless the authorization is terminated or revoked sooner.  Performed at [facility], [place] [address] [place], [place] [address]   MRSA PCR Screening     Status: None   Collection Time: 04/04/21 11:31 AM   Specimen: Nasopharyngeal  Result Value Ref Range Status   MRSA by PCR NEGATIVE NEGATIVE Final    Comment:        The GeneXpert MRSA Assay (FDA approved for NASAL specimens only), is one component of a comprehensive MRSA colonization surveillance program. It is not intended to diagnose MRSA infection nor to guide or monitor treatment for MRSA infections. Performed at [facility], [address] [place], [place] [address]          Radiology Studies: No results found.      Scheduled Meds: . Chlorhexidine Gluconate Cloth  6 each Topical Q0600  .  cyanocobalamin  1,000 mcg Intramuscular Q30 days  . enoxaparin (LOVENOX) injection  40 mg Subcutaneous Q24H  . lactulose  20 g Oral TID  . pantoprazole  40 mg Oral BID  . potassium chloride  20 mEq Oral Daily  . potassium chloride  40 mEq Oral Once  . rifaximin  550 mg Oral BID  . sodium bicarbonate  1,300 mg Oral BID  . spironolactone  100 mg Oral Daily  . torsemide  40 mg Oral BID  . [START ON 04/10/2021] Vitamin D (Ergocalciferol)  50,000 Units Oral Q Sun   Continuous Infusions: . cefTRIAXone (ROCEPHIN)  IV 2 g (04/05/21 1507)     LOS: 2 days    Time spent: 35  Minutes.     Kathie Dike, MD [facility]   If 7PM-7AM, please contact night-coverage [email or web address]  04/06/2021, 12:18 PM

## 2021-04-06 NOTE — Progress Notes (Signed)
PT Cancellation Note  Patient Details Name: Crystal [name] MRN: [id number] DOB: [birth year]   Cancelled Treatment:    Reason Eval/Treat Not Completed: Medical issues which prohibited therapy Chart reviewed, spoke with new nurse after transfer out of CCU.  Nurse tells me she recently complained of 10/10 pain in her lower leg.  Nurse messaged MD and wants to rule out DVT, etc before we initiate PT.  Will maintain on caseload and attempt to see tomorrow as appropriate.    Kreg Shropshire, DPT 04/06/2021, 3:35 PM

## 2021-04-07 ENCOUNTER — Encounter: Payer: Self-pay | Admitting: *Deleted

## 2021-04-07 ENCOUNTER — Other Ambulatory Visit: Payer: Medicare HMO

## 2021-04-07 ENCOUNTER — Inpatient Hospital Stay: Payer: Medicare HMO

## 2021-04-07 DIAGNOSIS — K7290 Hepatic failure, unspecified without coma: Secondary | ICD-10-CM | POA: Diagnosis not present

## 2021-04-07 DIAGNOSIS — N184 Chronic kidney disease, stage 4 (severe): Secondary | ICD-10-CM | POA: Diagnosis not present

## 2021-04-07 DIAGNOSIS — K219 Gastro-esophageal reflux disease without esophagitis: Secondary | ICD-10-CM | POA: Diagnosis not present

## 2021-04-07 DIAGNOSIS — I5032 Chronic diastolic (congestive) heart failure: Secondary | ICD-10-CM | POA: Diagnosis not present

## 2021-04-07 LAB — RENAL FUNCTION PANEL
Albumin: 2 g/dL — ABNORMAL LOW (ref 3.5–5.0)
Anion gap: 6 (ref 5–15)
BUN: 21 mg/dL (ref 8–23)
CO2: 25 mmol/L (ref 22–32)
Calcium: 8.1 mg/dL — ABNORMAL LOW (ref 8.9–10.3)
Chloride: 106 mmol/L (ref 98–111)
Creatinine, Ser: 1.75 mg/dL — ABNORMAL HIGH (ref 0.44–1.00)
GFR, Estimated: 31 mL/min — ABNORMAL LOW (ref >60)
Glucose, Bld: 165 mg/dL — ABNORMAL HIGH (ref 70–99)
Phosphorus: 3.4 mg/dL (ref 2.5–4.6)
Potassium: 4 mmol/L (ref 3.5–5.1)
Sodium: 137 mmol/L (ref 135–145)

## 2021-04-07 LAB — COMPREHENSIVE METABOLIC PANEL
ALT: 13 U/L (ref 0–44)
AST: 24 U/L (ref 15–41)
Albumin: 1.9 g/dL — ABNORMAL LOW (ref 3.5–5.0)
Alkaline Phosphatase: 116 U/L (ref 38–126)
Anion gap: 6 (ref 5–15)
BUN: 22 mg/dL (ref 8–23)
CO2: 25 mmol/L (ref 22–32)
Calcium: 8.2 mg/dL — ABNORMAL LOW (ref 8.9–10.3)
Chloride: 106 mmol/L (ref 98–111)
Creatinine, Ser: 1.77 mg/dL — ABNORMAL HIGH (ref 0.44–1.00)
GFR, Estimated: 31 mL/min — ABNORMAL LOW (ref >60)
Glucose, Bld: 165 mg/dL — ABNORMAL HIGH (ref 70–99)
Potassium: 4 mmol/L (ref 3.5–5.1)
Sodium: 137 mmol/L (ref 135–145)
Total Bilirubin: 1.2 mg/dL (ref 0.3–1.2)
Total Protein: 5.3 g/dL — ABNORMAL LOW (ref 6.5–8.1)

## 2021-04-07 LAB — CBC
HCT: 30.2 % — ABNORMAL LOW (ref 36.0–46.0)
Hemoglobin: 9.9 g/dL — ABNORMAL LOW (ref 12.0–15.0)
MCH: 26.5 pg (ref 26.0–34.0)
MCHC: 32.8 g/dL (ref 30.0–36.0)
MCV: 81 fL (ref 80.0–100.0)
Platelets: 97 10*3/uL — ABNORMAL LOW (ref 150–400)
RBC: 3.73 MIL/uL — ABNORMAL LOW (ref 3.87–5.11)
RDW: 17.9 % — ABNORMAL HIGH (ref 11.5–15.5)
WBC: 8 10*3/uL (ref 4.0–10.5)
nRBC: 0 % (ref 0.0–0.2)

## 2021-04-07 LAB — GLUCOSE, CAPILLARY
Glucose-Capillary: 158 mg/dL — ABNORMAL HIGH (ref 70–99)
Glucose-Capillary: 208 mg/dL — ABNORMAL HIGH (ref 70–99)
Glucose-Capillary: 203 mg/dL — ABNORMAL HIGH (ref 70–99)
Glucose-Capillary: 187 mg/dL — ABNORMAL HIGH (ref 70–99)
Glucose-Capillary: 204 mg/dL — ABNORMAL HIGH (ref 70–99)

## 2021-04-07 LAB — VITAMIN B12: Vitamin B-12: 1535 pg/mL — ABNORMAL HIGH (ref 180–914)

## 2021-04-07 LAB — AMMONIA: Ammonia: 45 umol/L — ABNORMAL HIGH (ref 9–35)

## 2021-04-07 MED ORDER — LACTULOSE 10 GM/15ML PO SOLN
20.0000 g | Freq: Two times a day (BID) | ORAL | Status: DC
  Administered 2021-04-07 – 2021-04-08 (×2): 20 g via ORAL
  Filled 2021-04-07 (×2): qty 30

## 2021-04-07 MED ORDER — APIXABAN 5 MG PO TABS
10.0000 mg | ORAL_TABLET | Freq: Two times a day (BID) | ORAL | Status: DC
  Administered 2021-04-07 – 2021-04-08 (×2): 10 mg via ORAL
  Filled 2021-04-07 (×2): qty 2

## 2021-04-07 MED ORDER — APIXABAN 5 MG PO TABS: 5.0000 mg | ORAL_TABLET | Freq: Two times a day (BID) | ORAL | Status: DC

## 2021-04-07 MED ORDER — ENOXAPARIN SODIUM 60 MG/0.6ML IJ SOSY
52.0000 mg | PREFILLED_SYRINGE | INTRAMUSCULAR | Status: DC
  Administered 2021-04-07: 52 mg via SUBCUTANEOUS
  Filled 2021-04-07: qty 0.6

## 2021-04-07 NOTE — Progress Notes (Addendum)
PROGRESS NOTE    Crystal [name]  ZHG:[id number] DOB: [birth year] DOA: 04/04/2021 PCP: Martin Majestic, FNP   Brief Narrative: 70 year old with past medical history significant for liver cirrhosis, CKD stage IV, Chronic Diastolic CHF, type 2 diabetes, peptic ulcer disease, chronic anemia who presents to the emergency department with acute onset of altered mental status, confusion that is started the evening of admission.  Patient became agitated and screaming and restless per family report.  She was given an extra dose of lactulose.  Evaluation in the ED: CT head no acute intracranial abnormalities, alkaline phosphatase 141, bilirubin 1.9, ammonia level of 119. Cr 2.0   Patient was admitted with acute hepatic encephalopathy.   Assessment & Plan:   Active Problems:   Hepatic encephalopathy (HCC)  1-Acute Hepatic Encephalopathy:  NASH Cirrhosis of Liver:  Continue with  ceftriaxone to cover for SBP.  Ammonia improved 119->59->45 Continue torsemide,  spironolactone.  GI was consulted. Recommend continue with lactulose.  Mental status continues to improve, and appears to be approaching baseline Continue lactulose and xifaxan  2-Diabetes Type 2, correction stage IV CKD> , Hypoglycemia.  Monitor CBG with SSI  Poor oral intake.  Continue to follow blood sugars  3-CKD stage IV;  Cr baseline 2. 0 Monitor, stable.  Cr down to 1.7 Resume spironolactone and lasix, monitor renal function.   Compensated Chronic Diastolic HF;  Stable. Monitor.  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   GERD;  Continue with PPI.   RLE DVT -venous dopplers show acute DVT in RLE -also comments on chronic DVT of LLE -patient does not report any known history of prior DVT -started on eliquis for anticoagulation  Thrombocytopenia  -related to liver disease -continue to follow     Estimated body mass index is 37.14 kg/m as calculated from the following:   Height as  of this encounter: 5' 2" (1.575 m).   Weight as of this encounter: 92.1 kg.   DVT prophylaxis: apixaban Code Status: Full code Family Communication: Daughter over the phone 5/26 Disposition Plan:  Status is: Inpatient  Remains inpatient appropriate because:IV treatments appropriate due to intensity of illness or inability to take PO   Dispo: The patient is from: Home              Anticipated d/c is to: Home              Patient currently is not medically stable to d/c.   Difficult to place patient No        Consultants:   GI  Procedures:   None  Antimicrobials:  Ceftriaxone  Subjective: Continues to have some pain in her feet.   Objective: Vitals:   04/07/21 0201 04/07/21 0546 04/07/21 0730 04/07/21 1100  BP:  105/64 116/61 110/65  Pulse:  85 86 87  Resp: _0 Temp:  98.4 F (36.9 C) 98.4 F (36.9 C) 98.4 F (36.9 C)  TempSrc:  Oral  Oral  SpO2:  95% 95% 95%  Weight:      Height:        Intake/Output Summary (Last 24 hours) at 04/07/2021 1235 Last data filed at 04/07/2021 0959 Gross per 24 hour  Intake 1130 ml  Output 1900 ml  Net -770 ml   Filed Weights   04/04/21 0032 04/04/21 1126  Weight: 88.3 kg 92.1 kg    Examination:  General exam: Alert, awake, oriented x 3 Respiratory system: Clear to auscultation.  Respiratory effort normal. Cardiovascular system:RRR. No murmurs, rubs, gallops. Gastrointestinal system: Abdomen is nondistended, soft and nontender. No organomegaly or masses felt. Normal bowel sounds heard. Central nervous system: Alert and oriented. No focal neurological deficits. No asterixis Extremities: No C/C/E, +pedal pulses Skin: No rashes, lesions or ulcers Psychiatry: Judgement and insight appear normal. Mood & affect appropriate.       Data Reviewed: I have personally reviewed following labs and imaging studies  CBC: Recent Labs  Lab 04/04/21 0127 04/04/21 0622 04/05/21 1031 04/06/21 0450 04/07/21 0445   WBC 5.6 5.9 6.7 7.2 8.0  NEUTROABS 4.1  --   --   --   --   HGB 10.9* 10.4* 9.5* 9.3* 9.9*  HCT 32.7* 31.3* 29.4* 28.5* 30.2*  MCV 83.0 81.5 80.8 82.1 81.0  PLT 110* 107* 101* 96* 97*   Basic Metabolic Panel: Recent Labs  Lab 04/04/21 0127 04/04/21 0622 04/05/21 1031 04/06/21 0450 04/07/21 0445  NA 136 138 138 139 137  137  K 4.0 4.0 3.4* 3.3* 4.0  4.0  CL 102 103 108 109 106  106  CO2 22 24 21* _0 GLUCOSE 162* 150* 189* 181* 165*  165*  BUN 26* 27* _1 CREATININE 2.07* 2.06* 1.53* 1.58* 1.75*  1.77*  CALCIUM 9.0 9.0 8.1* 8.2* 8.1*  8.2*  MG  --   --   --  1.9  --   PHOS  --   --   --   --  3.4   GFR: Estimated Creatinine Clearance: 31.7 mL/min (A) (by C-G formula based on SCr of 1.77 mg/dL (H)). Liver Function Tests: Recent Labs  Lab 04/04/21 0127 04/04/21 0622 04/05/21 1031 04/06/21 0450 04/07/21 0445  AST _2 ALT _3 ALKPHOS 141* 135* 105 105 116  BILITOT 1.9* 1.8* 1.7* 1.4* 1.2  PROT 6.6 6.2* 5.5* 4.9* 5.3*  ALBUMIN 2.7* 2.6* 2.1* 1.9* 2.0*  1.9*   No results for input(s): LIPASE, AMYLASE in the last 168 hours. Recent Labs  Lab 04/04/21 0127 04/04/21 0622 04/05/21 0500 04/07/21 0445  AMMONIA 119* 88* 59* 45*   Coagulation Profile: No results for input(s): INR, PROTIME in the last 168 hours. Cardiac Enzymes: No results for input(s): CKTOTAL, CKMB, CKMBINDEX, TROPONINI in the last 168 hours. BNP (last 3 results) No results for input(s): PROBNP in the last 8760 hours. HbA1C: Recent Labs    04/06/21 0500  HGBA1C 6.7*   CBG: Recent Labs  Lab 04/06/21 1213 04/06/21 1649 04/06/21 1954 04/07/21 0757 04/07/21 1147  GLUCAP 189* 118* 199* 158* 208*   Lipid Profile: No results for input(s): CHOL, HDL, LDLCALC, TRIG, CHOLHDL, LDLDIRECT in the last 72 hours. Thyroid Function Tests: No results for input(s): TSH, T4TOTAL, FREET4, T3FREE, THYROIDAB in the last 72 hours. Anemia Panel: No results for  input(s): VITAMINB12, FOLATE, FERRITIN, TIBC, IRON, RETICCTPCT in the last 72 hours. Sepsis Labs: No results for input(s): PROCALCITON, LATICACIDVEN in the last 168 hours.  Recent Results (from the past 240 hour(s))  SARS CORONAVIRUS 2 (TAT 6-24 HRS) Nasopharyngeal Nasopharyngeal Swab     Status: None   Collection Time: 04/04/21  4:06 AM   Specimen: Nasopharyngeal Swab  Result Value Ref Range Status   SARS Coronavirus 2 NEGATIVE NEGATIVE Final    Comment: (NOTE) SARS-CoV-2 target nucleic acids are NOT DETECTED.  The SARS-CoV-2 RNA is generally detectable in upper and lower respiratory specimens during the acute  phase of infection. Negative results do not preclude SARS-CoV-2 infection, do not rule out co-infections with other pathogens, and should not be used as the sole basis for treatment or other patient management decisions. Negative results must be combined with clinical observations, patient history, and epidemiological information. The expected result is Negative.  Fact Sheet for Patients: SugarRoll.be  Fact Sheet for Healthcare Providers: https://www.woods-mathews.com/  This test is not yet approved or cleared by the [place] FDA and  has been authorized for detection and/or diagnosis of SARS-CoV-2 by FDA under an Emergency Use Authorization (EUA). This EUA will remain  in effect (meaning this test can be used) for the duration of the COVID-19 declaration under Se ction 564(b)(1) of the Act, 21 U.S.C. section 360bbb-3(b)(1), unless the authorization is terminated or revoked sooner.  Performed at [facility], [place] [address] [place], [place] [address]   MRSA PCR Screening     Status: None   Collection Time: 04/04/21 11:31 AM   Specimen: Nasopharyngeal  Result Value Ref Range Status   MRSA by PCR NEGATIVE NEGATIVE Final    Comment:        The GeneXpert MRSA Assay (FDA approved for NASAL specimens only), is one  component of a comprehensive MRSA colonization surveillance program. It is not intended to diagnose MRSA infection nor to guide or monitor treatment for MRSA infections. Performed at [facility], [address] [place],  [address]          Radiology Studies: US Venous Img Lower Bilateral (DVT)  Result Date: 04/07/2021 CLINICAL DATA:  Initial evaluation for bilateral foot pain. EXAM: BILATERAL LOWER EXTREMITY VENOUS DOPPLER ULTRASOUND TECHNIQUE: Gray-scale sonography with graded compression, as well as color Doppler and duplex ultrasound were performed to evaluate the lower extremity deep venous systems from the level of the common femoral vein and including the common femoral, femoral, profunda femoral, popliteal and calf veins including the posterior tibial, peroneal and gastrocnemius veins when visible. The superficial great saphenous vein was also interrogated. Spectral Doppler was utilized to evaluate flow at rest and with distal augmentation maneuvers in the common femoral, femoral and popliteal veins. COMPARISON:  Prior study from 03/30/2020 FINDINGS: RIGHT LOWER EXTREMITY Common Femoral Vein: No evidence of thrombus. Normal compressibility, respiratory phasicity and response to augmentation. Saphenofemoral Junction: No evidence of thrombus. Normal compressibility and flow on color Doppler imaging. Profunda Femoral Vein: No evidence of thrombus. Normal compressibility and flow on color Doppler imaging. Femoral Vein: No evidence of thrombus. Normal compressibility, respiratory phasicity and response to augmentation. Popliteal Vein: Hypoechoic material distending the right popliteal vein suspicious for thrombus. This is nonocclusive. Loss of normal compressibility. Calf Veins: Nonocclusive thrombus extends into the right posterior tibial and peroneal veins. Superficial Great Saphenous Vein: No evidence of thrombus. Normal compressibility. Venous Reflux:  None. Other Findings:   None. LEFT LOWER EXTREMITY Common Femoral Vein: Again seen is heterogeneous echogenic material positioned along the wall of the distal left common femoral vein, likely reflecting chronic thrombus. Luminal narrowing with decreased flow seen at this level. This is relatively similar in appearance from previous. The remainder of the visualized left common femoral vein appears widely patent. Saphenofemoral Junction: No evidence of thrombus. Normal compressibility and flow on color Doppler imaging. Profunda Femoral Vein: No evidence of thrombus. Normal compressibility and flow on color Doppler imaging. Femoral Vein: No evidence of thrombus. Normal compressibility, respiratory phasicity and response to augmentation. Popliteal Vein: No evidence of thrombus. Normal compressibility, respiratory phasicity and response to  augmentation. Calf Veins: No evidence of thrombus. Normal compressibility and flow on color Doppler imaging. Superficial Great Saphenous Vein: No evidence of thrombus. Normal compressibility. Venous Reflux:  None. Other Findings:  None. IMPRESSION: 1. Acute nonocclusive DVT involving the right popliteal vein extending into the veins of the right calf. 2. 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 No other evidence for acute DVT within the left lower extremity. Electronically Signed   By: Jeannine Boga M.D.   On: 04/07/2021 03:34        Scheduled Meds: . apixaban  10 mg Oral BID   Followed by  . [START ON 04/14/2021] apixaban  5 mg Oral BID  . Chlorhexidine Gluconate Cloth  6 each Topical Q0600  . cyanocobalamin  1,000 mcg Intramuscular Q30 days  . insulin aspart  0-5 Units Subcutaneous QHS  . insulin aspart  0-9 Units Subcutaneous TID WC  . lactulose  20 g Oral BID  . pantoprazole  40 mg Oral BID  . potassium chloride  20 mEq Oral Daily  . rifaximin  550 mg Oral BID  . sodium bicarbonate   1,300 mg Oral BID  . spironolactone  100 mg Oral Daily  . torsemide  40 mg Oral BID  . [START ON 04/10/2021] Vitamin D (Ergocalciferol)  50,000 Units Oral Q Sun   Continuous Infusions: . cefTRIAXone (ROCEPHIN)  IV Stopped (04/06/21 2021)     LOS: 3 days    Time spent: 35  Minutes.     Kathie Dike, MD [facility]   If 7PM-7AM, please contact night-coverage [email or web address]  04/07/2021, 12:35 PM

## 2021-04-07 NOTE — Care Management Important Message (Signed)
Important Message  Patient Details  Name: Crystal [name] MRN: [id number] Date of Birth: [birth year]   Medicare Important Message Given:  Yes     Juliann Pulse A Imraan Wendell 04/07/2021, 2:10 PM

## 2021-04-07 NOTE — Plan of Care (Signed)
Pt orientedx3, VSS, NSR on telemetry. C/o pain in right foot/leg addressed with x1 dose PRN Oxycodone. Ultrasound completed at bedside overnight of lower extremities. Adequate UO, continued watery stool in rectal tube. Turns self in bed with encouragement. Fall/safety precautions in place, rounding performed, needs/concerns addressed during shift.   Problem: Education: Goal: Knowledge of General Education information will improve Description: Including pain rating scale, medication(s)/side effects and non-pharmacologic comfort measures Outcome: Progressing   Problem: Health Behavior/Discharge Planning: Goal: Ability to manage health-related needs will improve Outcome: Progressing   Problem: Clinical Measurements: Goal: Ability to maintain clinical measurements within normal limits will improve Outcome: Progressing Goal: Will remain free from infection Outcome: Progressing Goal: Diagnostic test results will improve Outcome: Progressing Goal: Respiratory complications will improve Outcome: Progressing Goal: Cardiovascular complication will be avoided Outcome: Progressing   Problem: Activity: Goal: Risk for activity intolerance will decrease Outcome: Progressing   Problem: Nutrition: Goal: Adequate nutrition will be maintained Outcome: Progressing   Problem: Coping: Goal: Level of anxiety will decrease Outcome: Progressing   Problem: Elimination: Goal: Will not experience complications related to bowel motility Outcome: Progressing Goal: Will not experience complications related to urinary retention Outcome: Progressing   Problem: Pain Managment: Goal: General experience of comfort will improve Outcome: Progressing   Problem: Safety: Goal: Ability to remain free from injury will improve Outcome: Progressing   Problem: Skin Integrity: Goal: Risk for impaired skin integrity will decrease Outcome: Progressing

## 2021-04-07 NOTE — Discharge Instructions (Signed)
Information on my medicine - ELIQUIS (apixaban)  This medication education was reviewed with me or my healthcare representative as part of my discharge preparation.    Why was Eliquis prescribed for you? Eliquis was prescribed to treat blood clots that may have been found in the veins of your legs (deep vein thrombosis) or in your lungs (pulmonary embolism) and to reduce the risk of them occurring again.  What do You need to know about Eliquis ? The starting dose is 10 mg (two 5 mg tablets) taken TWICE daily for the FIRST SEVEN (7) DAYS, then on 04/14/2021  the dose is reduced to ONE 5 mg tablet taken TWICE daily.  Eliquis may be taken with or without food.   Try to take the dose about the same time in the morning and in the evening. If you have difficulty swallowing the tablet whole please discuss with your pharmacist how to take the medication safely.  Take Eliquis exactly as prescribed and DO NOT stop taking Eliquis without talking to the doctor who prescribed the medication.  Stopping may increase your risk of developing a new blood clot.  Refill your prescription before you run out.  After discharge, you should have regular check-up appointments with your healthcare provider that is prescribing your Eliquis.    What do you do if you miss a dose? If a dose of ELIQUIS is not taken at the scheduled time, take it as soon as possible on the same day and twice-daily administration should be resumed. The dose should not be doubled to make up for a missed dose.  Important Safety Information A possible side effect of Eliquis is bleeding. You should call your healthcare provider right away if you experience any of the following: ? Bleeding from an injury or your nose that does not stop. ? Unusual colored urine (red or dark brown) or unusual colored stools (red or black). ? Unusual bruising for unknown reasons. ? A serious fall or if you hit your head (even if there is no bleeding).  Some  medicines may interact with Eliquis and might increase your risk of bleeding or clotting while on Eliquis. To help avoid this, consult your healthcare provider or pharmacist prior to using any new prescription or non-prescription medications, including herbals, vitamins, non-steroidal anti-inflammatory drugs (NSAIDs) and supplements.  This website has more information on Eliquis (apixaban): http://www.eliquis.com/eliquis/home

## 2021-04-07 NOTE — Progress Notes (Addendum)
ANTICOAGULATION CONSULT NOTE - Initial Consult  Pharmacy Consult for Apixaban Indication: DVT  No Known Allergies  Patient Measurements: Height: 5' 2" (157.5 cm) Weight: 92.1 kg (203 lb 0.7 oz) IBW/kg (Calculated) : 50.1  Vital Signs: Temp: 98.4 F (36.9 C) (05/26 0730) Temp Source: Oral (05/26 0546) BP: 116/61 (05/26 0730) Pulse Rate: 86 (05/26 0730)  Labs: Recent Labs    04/05/21 1031 04/06/21 0450 04/07/21 0445  HGB 9.5* 9.3* 9.9*  HCT 29.4* 28.5* 30.2*  PLT 101* 96* 97*  CREATININE 1.53* 1.58* 1.75*  1.77*    Estimated Creatinine Clearance: 31.7 mL/min (A) (by C-G formula based on SCr of 1.77 mg/dL (H)).   Medical History: Past Medical History:  Diagnosis Date  . Acute GI bleeding 02/09/2018  . Acute upper gastrointestinal bleeding 08/03/2018  . Anemia    TAKES IRON TAB  . Arthritis    SHOULDER  . B12 deficiency 02/18/2018  . Bleeding    GI  3/19  . Bronchitis    HX OF  . CHF (congestive heart failure) ([place])   . Cirrhosis ([place])   . Colon cancer screening   . Diabetes mellitus without complication ([place])    TYPE 2  . Fatty (change of) liver, not elsewhere classified   . Full dentures    UPPER AND LOWER  . Iron deficiency anemia due to chronic blood loss 02/18/2018  . Lung nodule, multiple   . Pernicious anemia 02/22/2018  . PUD (peptic ulcer disease)   . Raynaud's syndrome   . Upper GI bleeding 08/03/2018    Assessment: 70 yo female admitted with hepatic encephalopathy. Patient has PMH of CKD, CHF, DM, PUD, liver cirrhosis and anemia. Patient reported right leg pain 5/26, and was found to have DVT. Pharmacy has been consulted for apixaban (Eliquis) dosing and monitoring.   UA Lower Bilateral: 1.Acute nonocclusive DVT involving the right popliteal vein extending into the veins of the right calf. 2. 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  PLts:  97     Plan:  Patient received enoxaparin 60m this morning. After discussion with attending MD, will order enox 579mx 1 for a total of 9242mtreatment dose) and begin apixaban this evening.  Will start apixaban 15m53mice daily for 7 days, followed by apixaban 5mg 71mce daily thereafter. Patient should transition to 5mg t66me daily dose on June 2nd. Patient has thrombocytopenia (PLts: 97).  Per protocol, will plan for CBC at least every 3 days while inpatient.    SheemaPernell DupremD, BCPS Clinical Pharmacist 04/07/2021 8:59 AM

## 2021-04-07 NOTE — Evaluation (Signed)
Physical Therapy Evaluation Patient Details Name: Crystal [name] MRN: [id number] DOB: [birth year] Today's Date: 04/07/2021   History of Present Illness  70 year old with past medical history significant for liver cirrhosis, CKD stage IV, Chronic Diastolic CHF, type 2 diabetes, peptic ulcer disease, chronic anemia who presents to the emergency department with acute onset of altered mental status, confusion. Acute L LE DVT and chronic R DVT.  Clinical Impression  Pt showed great effort and was able to attempt 3 separate bouts of ambulation but each time she became very fatigued (HR difficult to accurately read but appeared to stay <120) but b/l LEs would start to hurt too much and she could not push herself to do more.  She typically needs only a cane or no AD and was highly reliant on the walker today and struggled to rise from low surfaces, again b/l LE swelling appears to be the biggest functional limiter this session.      Follow Up Recommendations Home health PT    Equipment Recommendations  Rolling walker with 5" wheels    Recommendations for Other Services       Precautions / Restrictions Precautions Precautions: Fall Restrictions Weight Bearing Restrictions: No      Mobility  Bed Mobility Overal bed mobility: Needs Assistance Bed Mobility: Supine to Sit     Supine to sit: Min guard;Min assist          Transfers Overall transfer level: Needs assistance Equipment used: Rolling walker (2 wheeled) Transfers: Sit to/from Stand Sit to Stand: Min assist;Mod assist         General transfer comment: pt needed heavy rail use and mod assist to rise from low commode, multiple other sit to stands from recliner with little to no direct assist but guarded, heavy UE use, labored effort  Ambulation/Gait Ambulation/Gait assistance: Min guard Gait Distance (Feet): 25 Feet Assistive device: Rolling walker (2 wheeled)       General Gait Details: 3 bouts of ambulation, all  aborted secondary to LE pain and general fatigue.  15 ft, 20 ft, 25 ft with great effort and determination to try and do more but simply unable to push herself 2/2 pain and fatigue  Stairs            Wheelchair Mobility    Modified Rankin (Stroke Patients Only)       Balance Overall balance assessment: Modified Independent                                           Pertinent Vitals/Pain Pain Assessment: 0-10 Pain Score: 4  Pain Location: b/l LEs, R>L    Home Living Family/patient expects to be discharged to:: Private residence Living Arrangements: Non-relatives/Friends Available Help at Discharge: Available 24 hours/day   Home Access: Ramped entrance       Home Equipment: Cane - single point;Grab bars - toilet      Prior Function Level of Independence: Independent         Comments: Pt reports she did not necessarily do a lot of prolonged ambulation, but with cane or no AD in the community     Hand Dominance        Extremity/Trunk Assessment   Upper Extremity Assessment Upper Extremity Assessment: Overall WFL for tasks assessed;Generalized weakness    Lower Extremity Assessment Lower Extremity Assessment: Generalized weakness (pain limited due to swelling)  Communication   Communication: No difficulties  Cognition Arousal/Alertness: Awake/alert Behavior During Therapy: WFL for tasks assessed/performed Overall Cognitive Status: Within Functional Limits for tasks assessed                                        General Comments      Exercises     Assessment/Plan    PT Assessment Patient needs continued PT services  PT Problem List Decreased strength;Decreased range of motion;Decreased activity tolerance;Decreased balance;Decreased mobility;Decreased knowledge of use of DME;Decreased safety awareness;Cardiopulmonary status limiting activity;Pain       PT Treatment Interventions Gait training;Stair  training;Therapeutic exercise;Therapeutic activities;Functional mobility training;DME instruction;Neuromuscular re-education;Balance training;Patient/family education    PT Goals (Current goals can be found in the Care Plan section)  Acute Rehab PT Goals Patient Stated Goal: go home PT Goal Formulation: With patient Time For Goal Achievement: 04/21/21 Potential to Achieve Goals: Good    Frequency Min 2X/week   Barriers to discharge        Co-evaluation               AM-PAC PT "6 Clicks" Mobility  Outcome Measure Help needed turning from your back to your side while in a flat bed without using bedrails?: None Help needed moving from lying on your back to sitting on the side of a flat bed without using bedrails?: None Help needed moving to and from a bed to a chair (including a wheelchair)?: A Little Help needed standing up from a chair using your arms (e.g., wheelchair or bedside chair)?: A Little Help needed to walk in hospital room?: A Lot Help needed climbing 3-5 steps with a railing? : A Lot 6 Click Score: 18    End of Session Equipment Utilized During Treatment: Gait belt Activity Tolerance: Patient tolerated treatment well;Patient limited by pain;Patient limited by fatigue Patient left: with call bell/phone within reach;with chair alarm set;with family/visitor present Nurse Communication: Mobility status PT Visit Diagnosis: Muscle weakness (generalized) (M62.81);Difficulty in walking, not elsewhere classified (R26.2);Pain Pain - Right/Left: Left Pain - part of body: Leg    Time: 4481-8563 PT Time Calculation (min) (ACUTE ONLY): 24 min   Charges:   PT Evaluation $PT Eval Low Complexity: 1 Low PT Treatments $Gait Training: 8-22 mins        Kreg Shropshire, DPT 04/07/2021, 1:43 PM

## 2021-04-07 NOTE — TOC Progression Note (Signed)
Transition of Care [facility]) - Progression Note    Patient Details  Name: Crystal [name] MRN: [id number] Date of Birth: 10/10/1951  Transition of Care [facility]) CM/SW Contact  Shelbie Hutching, RN Phone Number: 04/07/2021, 3:00 PM  Clinical Narrative:    [facility] cannot see patient until 5 days out- [facility] is able to see patient within 48 hours.  [facility] accepted referral.  Planned discharge for patient tomorrow.  Expected Discharge Plan: [place] Barriers to Discharge: Continued Medical Work up  Expected Discharge Plan and Services Expected Discharge Plan: [place]   Discharge Planning Services: CM Consult Post Acute Care Choice: [place] arrangements for the past 2 months: Single Family Home                 DME Arranged: Walker rolling DME Agency: [facility] Date DME Agency Contacted: 04/07/21 Time DME Agency Contacted: 1216 Representative spoke with at DME Agency: [place]: RN,PT,OT [place] Agency: [place] Date [place]: 04/07/21 Time HH Agency Contacted: 1500 Representative spoke with at [place]: [place] Determinants of Health ([place]) Interventions    Readmission Risk Interventions Readmission Risk Prevention Plan 04/07/2021  Transportation Screening Complete  Medication Review Press photographer) Complete  PCP or Specialist appointment within 3-5 days of discharge Complete  HRI or [place] Complete  SW Recovery Care/Counseling Consult Complete  [place] Not Applicable  Some recent data might be hidden

## 2021-04-07 NOTE — TOC Initial Note (Signed)
Transition of Care [facility]) - Initial/Assessment Note    Patient Details  Name: Crystal [name] MRN: [id number] Date of Birth: 05-10-1951  Transition of Care [facility]) CM/SW Contact:    Shelbie Hutching, RN Phone Number: 04/07/2021, 12:47 PM  Clinical Narrative:                 Patient admitted to the hospital with metabolic encephalopathy.  RNCM met with patient at the bedside, her friend [name] is sitting at the bedside.  Patient and [name] live together in [place].  Patient reports that before coming into the hospital she was independent and drives.  PT is recommending HH at discharge.  Plan for discharge is tomorrow.  Patient agrees to home health services  [place] with [facility] has accepted referral for RN, PT, and OT.  Patient's friend [name] will transport her home.  Patient needs a RW, it will be ordered from [employer] and delivered to the patient's room.   Expected Discharge Plan: [place] Barriers to Discharge: Continued Medical Work up   Patient Goals and CMS Choice Patient states their goals for this hospitalization and ongoing recovery are:: Patient wants to feel better and go home- does not feel ready today CMS Medicare.gov Compare Post Acute Care list provided to:: Patient Choice offered to / list presented to : Patient  Expected Discharge Plan and Services Expected Discharge Plan: [place]   Discharge Planning Services: CM Consult Post Acute Care Choice: [place] arrangements for the past 2 months: Single Family Home                 DME Arranged: Walker rolling DME Agency: [employer] Date DME Agency Contacted: 04/07/21 Time DME Agency Contacted: 1216 Representative spoke with at DME Agency: [place]: RN,PT,OT [place] Agency: [place] Date [place]: 04/07/21 Time [place]: 1217 Representative spoke with at [place]: [place]  Prior Living Arrangements/Services Living arrangements for  the past 2 months: [place] with:: Friends Patient language and need for interpreter reviewed:: Yes Do you feel safe going back to the place where you live?: Yes      Need for Family Participation in Patient Care: Yes (Comment) (encephalopathy) Care giver support system in place?: Yes (comment) (friend) Current home services: DME (cane and ramp) Criminal Activity/Legal Involvement Pertinent to Current Situation/Hospitalization: No - Comment as needed  Activities of Daily Living Home Assistive Devices/Equipment: Cane (specify quad or straight) ADL Screening (condition at time of admission) Patient's cognitive ability adequate to safely complete daily activities?: Yes Is the patient deaf or have difficulty hearing?: No Does the patient have difficulty seeing, even when wearing glasses/contacts?: No Does the patient have difficulty concentrating, remembering, or making decisions?: No Patient able to express need for assistance with ADLs?: Yes Does the patient have difficulty dressing or bathing?: No Independently performs ADLs?: No Communication: Independent Dressing (OT): Needs assistance Is this a change from baseline?: Change from baseline, expected to last <3days Grooming: Needs assistance Is this a change from baseline?: Change from baseline, expected to last <3 days Feeding: Independent Bathing: Needs assistance Is this a change from baseline?: Change from baseline, expected to last <3 days Toileting: Independent with device (comment) In/Out Bed: Needs assistance Is this a change from baseline?: Change from baseline, expected to last <3 days Walks in Home: Independent with device (comment) Does the patient have difficulty walking or climbing stairs?: Yes Weakness of Legs: Both  Weakness of Arms/Hands: None  Permission Sought/Granted Permission sought to share information with : Case Manager,Family Supports,Other (comment) Permission granted to share information  with : Yes, Verbal Permission Granted  Share Information with NAME: [name]  Permission granted to share info w AGENCY: [facility]  Permission granted to share info w Relationship: friend     Emotional Assessment Appearance:: Appears stated age Attitude/Demeanor/Rapport: Engaged Affect (typically observed): Accepting Orientation: : Oriented to Self,Oriented to Place,Oriented to  Time,Oriented to Situation Alcohol / Substance Use: Not Applicable Psych Involvement: No (comment)  Admission diagnosis:  Hepatic encephalopathy ([place]) [K72.90] Patient Active Problem List   Diagnosis Date Noted  . Fluid retention 02/21/2021  . Edema 12/09/2020  . Acute on chronic diastolic heart failure ([place]) 12/09/2020  . Pulmonary nodules 05/02/2020  . Hepatic encephalopathy ([place]) 04/23/2020  . Left ovarian cyst 10/06/2019  . Hypokalemia 08/21/2019  . AMS (altered mental status) 08/20/2019  . Acute osteomyelitis of phalanx of hand ([place]) 06/12/2019  . Pain in finger of right hand 06/04/2019  . Type 2 diabetes mellitus without complication, without long-term current use of insulin ([place]) 04/02/2019  . Aortic atherosclerosis ([place]) 04/02/2019  . Coronary artery disease involving native coronary artery of native heart without angina pectoris 04/02/2019  . Acute on chronic heart failure with preserved ejection fraction (HFpEF) ([place]) 04/02/2019  . Lung nodules   . Volume overload 12/11/2018  . GAVE (gastric antral vascular ectasia)   . 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 Type 2 diabetes mellitus with stage 3 chronic kidney disease ([place]) 08/03/2018  . Hx of esophageal varices   . Cirrhosis of liver without ascites ([place])   . Angiodysplasia of small intestine ([place])   . Adnexal cyst 03/16/2018  . Goals of care, counseling/discussion 03/16/2018  . Right middle lobe pulmonary nodule 03/14/2018  . Right lower lobe pulmonary nodule 02/18/2018  . Iron deficiency anemia due  to chronic blood loss 02/18/2018  . B12 deficiency 02/18/2018  . 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 Hepatitis C virus infection without hepatic coma 02/18/2018   PCP:  Martin Majestic, FNP Pharmacy:   [id number] [id number]: [phone]: [phone]    Social Determinants of Health (SDOH) Interventions    Readmission Risk Interventions Readmission Risk Prevention Plan 04/07/2021  Transportation Screening Complete  Medication Review (Press photographer Complete  PCP or Specialist appointment within 3-5 days of discharge Complete  HRI or [place]  SW Recovery Care/Counseling Consult Complete  [place] Applic[place]  Some recent data might be hidden

## 2021-04-08 DIAGNOSIS — I82431 Acute embolism and thrombosis of right popliteal vein: Secondary | ICD-10-CM | POA: Diagnosis not present

## 2021-04-08 DIAGNOSIS — I82409 Acute embolism and thrombosis of unspecified deep veins of unspecified lower extremity: Secondary | ICD-10-CM | POA: Diagnosis present

## 2021-04-08 DIAGNOSIS — I5032 Chronic diastolic (congestive) heart failure: Secondary | ICD-10-CM | POA: Diagnosis not present

## 2021-04-08 DIAGNOSIS — D696 Thrombocytopenia, unspecified: Secondary | ICD-10-CM | POA: Diagnosis present

## 2021-04-08 DIAGNOSIS — K7460 Unspecified cirrhosis of liver: Secondary | ICD-10-CM | POA: Diagnosis present

## 2021-04-08 DIAGNOSIS — K7581 Nonalcoholic steatohepatitis (NASH): Secondary | ICD-10-CM | POA: Diagnosis not present

## 2021-04-08 DIAGNOSIS — K7290 Hepatic failure, unspecified without coma: Secondary | ICD-10-CM | POA: Diagnosis not present

## 2021-04-08 DIAGNOSIS — I824Z9 Acute embolism and thrombosis of unspecified deep veins of unspecified distal lower extremity: Secondary | ICD-10-CM | POA: Diagnosis present

## 2021-04-08 LAB — CBC
HCT: 30.4 % — ABNORMAL LOW (ref 36.0–46.0)
Hemoglobin: 10 g/dL — ABNORMAL LOW (ref 12.0–15.0)
MCH: 26.5 pg (ref 26.0–34.0)
MCHC: 32.9 g/dL (ref 30.0–36.0)
MCV: 80.4 fL (ref 80.0–100.0)
Platelets: 106 10*3/uL — ABNORMAL LOW (ref 150–400)
RBC: 3.78 MIL/uL — ABNORMAL LOW (ref 3.87–5.11)
RDW: 17.7 % — ABNORMAL HIGH (ref 11.5–15.5)
WBC: 8.2 10*3/uL (ref 4.0–10.5)
nRBC: 0 % (ref 0.0–0.2)

## 2021-04-08 LAB — GLUCOSE, CAPILLARY
Glucose-Capillary: 137 mg/dL — ABNORMAL HIGH (ref 70–99)
Glucose-Capillary: 196 mg/dL — ABNORMAL HIGH (ref 70–99)

## 2021-04-08 MED ORDER — HEPARIN SOD (PORK) LOCK FLUSH 100 UNIT/ML IV SOLN
INTRAVENOUS | Status: AC
  Filled 2021-04-08: qty 5

## 2021-04-08 MED ORDER — APIXABAN 5 MG PO TABS: ORAL_TABLET | ORAL | 1 refills | Status: DC

## 2021-04-08 MED ORDER — HYDROXYZINE HCL 25 MG PO TABS: 25.0000 mg | ORAL_TABLET | Freq: Two times a day (BID) | ORAL | 0 refills | Status: DC | PRN

## 2021-04-08 MED ORDER — OXYCODONE HCL 5 MG PO TABS: 5.0000 mg | ORAL_TABLET | Freq: Two times a day (BID) | ORAL | 0 refills | Status: DC | PRN

## 2021-04-08 MED ORDER — LACTULOSE 10 GM/15ML PO SOLN: 20.0000 g | Freq: Three times a day (TID) | ORAL | 2 refills | Status: DC

## 2021-04-08 MED ORDER — HEPARIN SOD (PORK) LOCK FLUSH 100 UNIT/ML IV SOLN: 500.0000 [IU] | Freq: Once | INTRAVENOUS | Status: DC

## 2021-04-08 NOTE — Progress Notes (Addendum)
Physical Therapy Treatment Patient Details Name: Crystal [name] MRN: [id number] DOB: [birth year] Today's Date: 04/08/2021    History of Present Illness 70 year old with past medical history significant for liver cirrhosis, CKD stage IV, Chronic Diastolic CHF, type 2 diabetes, peptic ulcer disease, chronic anemia who presents to the emergency department with acute onset of altered mental status, confusion. Acute L LE DVT and chronic R DVT.    PT Comments    Pt was long sitting in bed upon arriving. She is alert and oriented and eager to DC. Pt was easily able to exit bed, stand, and ambulate 200 ft without physical assistance. Does endorse BLE pain however did not limit pt's abilities. Recommend DC home with HHPT to continue to progress strength, activity tolerance, and safe functional mobility.     Follow Up Recommendations  Home health PT     Equipment Recommendations  Rolling walker with 5" wheels       Precautions / Restrictions Precautions Precautions: Fall Restrictions Weight Bearing Restrictions: No    Mobility  Bed Mobility Overal bed mobility: Modified Independent    General bed mobility comments: HOB was elevated but pt has no difficulty    Transfers Overall transfer level: Needs assistance Equipment used: Rolling walker (2 wheeled) Transfers: Sit to/from Stand Sit to Stand: Supervision            Ambulation/Gait Ambulation/Gait assistance: Supervision Gait Distance (Feet): 200 Feet Assistive device: Rolling walker (2 wheeled) Gait Pattern/deviations: Step-through pattern Gait velocity: decreased   General Gait Details: Pt was easily and safely able to ambulate 200 ft with RW without LOB        Cognition Arousal/Alertness: Awake/alert Behavior During Therapy: WFL for tasks assessed/performed Overall Cognitive Status: Within Functional Limits for tasks assessed        General Comments: Pt is A and O x 4             Pertinent Vitals/Pain Pain  Assessment: 0-10 Pain Score: 8  Pain Location: BLE feet Pain Intervention(s): Limited activity within patient's tolerance;Monitored during session;Repositioned           PT Goals (current goals can now be found in the care plan section) Acute Rehab PT Goals Patient Stated Goal: go home Progress towards PT goals: Progressing toward goals    Frequency    Min 2X/week      PT Plan Current plan remains appropriate       AM-PAC PT "6 Clicks" Mobility   Outcome Measure  Help needed turning from your back to your side while in a flat bed without using bedrails?: None Help needed moving from lying on your back to sitting on the side of a flat bed without using bedrails?: None Help needed moving to and from a bed to a chair (including a wheelchair)?: A Little Help needed standing up from a chair using your arms (e.g., wheelchair or bedside chair)?: A Little Help needed to walk in hospital room?: A Little Help needed climbing 3-5 steps with a railing? : A Little 6 Click Score: 20    End of Session Equipment Utilized During Treatment: Gait belt Activity Tolerance: Patient tolerated treatment well Patient left: with call bell/phone within reach;with chair alarm set;with family/visitor present Nurse Communication: Mobility status PT Visit Diagnosis: Muscle weakness (generalized) (M62.81);Difficulty in walking, not elsewhere classified (R26.2);Pain     Time: 0973-5329 PT Time Calculation (min) (ACUTE ONLY): 23 min  Charges:  $Gait Training: 8-22 mins $Therapeutic Activity: 8-22 mins  Julaine Fusi PTA 04/08/21, 9:07 AM

## 2021-04-08 NOTE — Progress Notes (Signed)
Crystal [name] to be D/C'd  per MD order. Discussed with the patient and all questions fully answered.  VSS, Skin clean, dry and intact without evidence of skin break down, no evidence of skin tears noted.  IV catheter discontinued intact. Site without signs and symptoms of complications. Dressing and pressure applied.  An After Visit Summary was printed and given to the patient. Patient received prescription.  D/c education completed with patient/family including follow up instructions, medication list, d/c activities limitations if indicated, with other d/c instructions as indicated by MD - patient able to verbalize understanding, all questions fully answered.   Patient instructed to return to ED, call 911, or call MD for any changes in condition.   Patient to be escorted via [place], and D/C home via private auto.

## 2021-04-08 NOTE — Plan of Care (Signed)
Pt Aox4, VSS, remains on RA. Walked to bathroom with use of walker, small BM overnight. Purewick in place, adequate UO. C/o pain in right food addressed with x1 dose of PRN oxycodone, pt reported relief. Turns self in bed. Rounding performed, fall/safety precautions in place, needs/concerns addressed during shift.   Problem: Education: Goal: Knowledge of General Education information will improve Description: Including pain rating scale, medication(s)/side effects and non-pharmacologic comfort measures Outcome: Progressing   Problem: Health Behavior/Discharge Planning: Goal: Ability to manage health-related needs will improve Outcome: Progressing   Problem: Clinical Measurements: Goal: Ability to maintain clinical measurements within normal limits will improve Outcome: Progressing Goal: Will remain free from infection Outcome: Progressing Goal: Diagnostic test results will improve Outcome: Progressing Goal: Respiratory complications will improve Outcome: Progressing Goal: Cardiovascular complication will be avoided Outcome: Progressing   Problem: Activity: Goal: Risk for activity intolerance will decrease Outcome: Progressing   Problem: Nutrition: Goal: Adequate nutrition will be maintained Outcome: Progressing   Problem: Coping: Goal: Level of anxiety will decrease Outcome: Progressing   Problem: Elimination: Goal: Will not experience complications related to bowel motility Outcome: Progressing Goal: Will not experience complications related to urinary retention Outcome: Progressing   Problem: Pain Managment: Goal: General experience of comfort will improve Outcome: Progressing   Problem: Safety: Goal: Ability to remain free from injury will improve Outcome: Progressing   Problem: Skin Integrity: Goal: Risk for impaired skin integrity will decrease Outcome: Progressing

## 2021-04-08 NOTE — TOC Transition Note (Signed)
Transition of Care [facility]) - CM/SW Discharge Note   Patient Details  Name: Crystal [name] MRN: [id number] Date of Birth: 1951/01/10  Transition of [facility]) CM/SW Contact:  Shelbie Hutching, RN Phone Number: 04/08/2021, 3:52 PM   Clinical Narrative:    Patient medically cleared for discharge home with home health service through [place] [facility].  [place] with [facility] is aware of DC today and HH orders have been placed.  RW and 3in1 delivered to the patient's room.  Patient's friend will be picking her up today.   Final next level of care: [place] Barriers to Discharge: Barriers Resolved   Patient Goals and CMS Choice Patient states their goals for this hospitalization and ongoing recovery are:: Patient wants to feel better and go home- does not feel ready today CMS Medicare.gov Compare Post Acute Care list provided to:: Patient Choice offered to / list presented to : Patient  Discharge Placement                       Discharge Plan and Services   Discharge Planning Services: CM Consult Post Acute Care Choice: Home Health          DME Arranged: [place]ford Rile rolling,3-N-1 DME Agency: [employer] Date DME Agency Contacted: 04/08/21 Time DME Agency Contacted: 58 Representative spoke with at DME Agency: [place]: RN,PT,OT [place]: [place] Date [place]: 04/08/21 Time [place]: 1445 Representative spoke with at [place]: [place] ([place]) Interventions     Readmission Risk Interventions Readmission Risk Prevention Plan 04/07/2021  Transportation Screening Complete  Medication Review Press photographer) Complete  PCP or Specialist appointment within 3-5 days of discharge Complete  HRI or [place] Complete  SW Recovery Care/Counseling Consult Complete  [place] Not Applicable  Some recent data might be hidden

## 2021-04-08 NOTE — Discharge Summary (Signed)
Physician Discharge Summary  Crystal [name] WNU:[id number] DOB: 1951/03/29 DOA: 04/04/2021  PCP: Martin Majestic, FNP  Admit date: 04/04/2021 Discharge date: 04/08/2021  Admitted From: home Disposition:  home  Recommendations for Outpatient Follow-up:  1. Follow up with PCP in 1-2 weeks 2. Please obtain BMP/CBC in one week 3. Follow up with GI in 1-2 weeks  Home Health: HH PT, OT, RN Equipment/Devices:3-in-1, walker  Discharge Condition:stable CODE STATUS:full code Diet recommendation: heart healthy  Brief/Interim Summary: 70 year old with past medical history significant for liver cirrhosis, CKD stage IV, Chronic Diastolic CHF, type 2 diabetes, peptic ulcer disease, chronic anemia who presents to the emergency department with acute onset of altered mental status, confusion that is started the evening of admission.  Patient became agitated and screaming and restless per family report.  She was given an extra dose of lactulose.  Evaluation in the ED: CT head no acute intracranial abnormalities, alkaline phosphatase 141, bilirubin 1.9, ammonia level of 119. Cr 2.0   Patient was admitted with acute hepatic encephalopathy.   Discharge Diagnoses:  Active Problems:   Type 2 diabetes mellitus with stage 3 chronic kidney disease (HCC)   S/P TIPS (transjugular intrahepatic portosystemic shunt)   Hepatic encephalopathy (HCC)   DVT of lower limb, acute (HCC)   Liver cirrhosis secondary to NASH (HCC)   Thrombocytopenia (HCC)   Chronic diastolic CHF (congestive heart failure) (HCC)  1-Acute Hepatic Encephalopathy:  NASH Cirrhosis of Liver:  Continue with  ceftriaxone to cover for SBP.  Ammonia improved 119->59->45 Continue torsemide,  spironolactone.  GI was consulted. Recommend continue with lactulose.  Mental status continues to improve, and now back to baseline Continue lactulose and xifaxan  2-Diabetes Type 2, correction stage IV CKD> , Hypoglycemia.  Monitor CBG with SSI   Poor oral intake.  Blood sugars remained stable Metformin discontinued due to renal dysfunction A1c 6.7 Continue dietary precautions  3-CKD stage IV;  Cr baseline 2. 0 Monitor, stable.  Cr down to 1.7 Resume spironolactone and lasix, monitor renal function.   Compensated Chronic Diastolic HF;  Stable. Monitor.  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   GERD;  Continue with PPI.   RLE DVT -venous dopplers show acute DVT in RLE -also comments on chronic DVT of LLE -patient does not report any known history of prior DVT -started on eliquis for anticoagulation -will need to follow closely in the setting of mild thrombocytopenia and liver disease  Thrombocytopenia  -related to liver disease -stable in the 90s -continue to follow  Discharge Instructions  Discharge Instructions    Diet - low sodium heart healthy   Complete by: As directed    Increase activity slowly   Complete by: As directed      Allergies as of 04/08/2021   No Known Allergies     Medication List    STOP taking these medications   metFORMIN 500 MG tablet Commonly known as: GLUCOPHAGE     TAKE these medications   acetaminophen 500 MG tablet Commonly known as: TYLENOL Take 500 mg by mouth daily as needed for moderate pain or headache.   apixaban 5 MG Tabs tablet Commonly known as: ELIQUIS Take 58m po bid for 7 days then 575mpo bid   aspirin-sod bicarb-citric acid 325 MG Tbef tablet Commonly known as: ALKA-SELTZER Take 650 mg by mouth every 6 (six) hours as needed (indigestion).   benzonatate 100 MG capsule Commonly known as: TESSALON Take 100 mg by  mouth 4 (four) times daily as needed.   cyanocobalamin 1000 MCG/ML injection Commonly known as: (VITAMIN B-12) Inject 1,000 mcg into the muscle every 30 (thirty) days.   hydrOXYzine 25 MG tablet Commonly known as: ATARAX/VISTARIL Take 1 tablet (25 mg total) by mouth 2 (two) times daily as needed. What  changed:   when to take this  reasons to take this   lactulose 10 GM/15ML solution Commonly known as: CHRONULAC Take 30 mLs (20 g total) by mouth 3 (three) times daily. What changed: See the new instructions.   Misc. Devices Kit Moderate compression hose 20-30 MM HG   ondansetron 4 MG tablet Commonly known as: ZOFRAN Take 4 mg by mouth every 8 (eight) hours as needed for nausea or vomiting.   OneTouch Delica Lancets 67E Misc 2 (two) times daily.   OneTouch Ultra test strip Generic drug: glucose blood 2 (two) times daily.   oxyCODONE 5 MG immediate release tablet Commonly known as: Oxy IR/ROXICODONE Take 1 tablet (5 mg total) by mouth 2 (two) times daily as needed for moderate pain.   pantoprazole 40 MG tablet Commonly known as: PROTONIX Take 40 mg by mouth 2 (two) times daily.   POTASSIUM CHLORIDE PO Take 15 mEq by mouth daily.   promethazine-dextromethorphan 6.25-15 MG/5ML syrup Commonly known as: PROMETHAZINE-DM Take 5 mLs by mouth as needed.   sodium bicarbonate 650 MG tablet Take 1,300 mg by mouth 2 (two) times daily.   spironolactone 100 MG tablet Commonly known as: ALDACTONE Take 100 mg by mouth daily.   torsemide 20 MG tablet Commonly known as: DEMADEX TAKE 2 TABLETS BY MOUTH 2 TIMES DAILY.   VISINE OP Place 1 drop into both eyes daily as needed (irritaiton).   Vitamin D (Ergocalciferol) 1.25 MG (50000 UNIT) Caps capsule Commonly known as: DRISDOL Take 50,000 Units by mouth every _0 /26/22 1251          Follow-up Information    Vanga, Tally Due, MD. Schedule an appointment as soon as possible for a visit in 2 week(s).   Specialty: Gastroenterology Contact information: [place] [address] [place], [place], FNP. Schedule an appointment as soon as possible for a visit in 2 week(s).   Specialty: Family Medicine Contact information: [place] [address] [phone]              No Known Allergies  Consultations:  gastroenterology   Procedures/Studies: CT Head Wo Contrast  Result Date: 04/04/2021 CLINICAL DATA:  Altered mental status. EXAM: CT HEAD WITHOUT CONTRAST TECHNIQUE: Contiguous axial images were obtained from the base of the skull through the vertex without intravenous contrast. COMPARISON:  August 20, 2019 FINDINGS: Brain: There is mild cerebral atrophy  with widening of the extra-axial spaces and ventricular dilatation. There are areas of decreased attenuation within the white matter tracts of the supratentorial brain, consistent with microvascular disease changes. Vascular: No hyperdense vessel or unexpected calcification. Skull: Normal. Negative for fracture or focal lesion. Sinuses/Orbits: No acute finding. Other: The study is limited secondary to patient motion. IMPRESSION: Limited study, as described above, without and acute intracranial abnormality. Electronically Signed   By: Virgina Norfolk M.D.   On: 04/04/2021 01:10   US Venous Img Lower Bilateral (DVT)  Result Date: 04/07/2021 CLINICAL DATA:  Initial evaluation for bilateral foot pain. EXAM: BILATERAL LOWER EXTREMITY VENOUS DOPPLER ULTRASOUND TECHNIQUE: Gray-scale sonography with graded compression, as well as color Doppler and duplex ultrasound were performed to evaluate the lower extremity deep venous systems from the level of the common femoral vein and including the common femoral, femoral, profunda  femoral, popliteal and calf veins including the posterior tibial, peroneal and gastrocnemius veins when visible. The superficial great saphenous vein was also interrogated. Spectral Doppler was utilized to evaluate flow at rest and with distal augmentation maneuvers in the common femoral, femoral and popliteal veins. COMPARISON:  Prior study from 03/30/2020 FINDINGS: RIGHT LOWER EXTREMITY Common Femoral Vein: No evidence of thrombus. Normal compressibility, respiratory phasicity and response to augmentation. Saphenofemoral Junction: No evidence of thrombus. Normal compressibility and flow on color Doppler imaging. Profunda Femoral Vein: No evidence of thrombus. Normal compressibility and flow on color Doppler imaging. Femoral Vein: No evidence of thrombus. Normal compressibility, respiratory phasicity and response to augmentation. Popliteal Vein: Hypoechoic material distending the right popliteal vein suspicious for thrombus. This is nonocclusive. Loss of normal compressibility. Calf Veins: Nonocclusive thrombus extends into the right posterior tibial and peroneal veins. Superficial Great Saphenous Vein: No evidence of thrombus. Normal compressibility. Venous Reflux:  None. Other Findings:  None. LEFT LOWER EXTREMITY Common Femoral Vein: Again seen is heterogeneous echogenic material positioned along the wall of the distal left common femoral vein, likely reflecting chronic thrombus. Luminal narrowing with decreased flow seen at this level. This is relatively similar in appearance from previous. The remainder of the visualized left common femoral vein appears widely patent. Saphenofemoral Junction: No evidence of thrombus. Normal compressibility and flow on color Doppler imaging. Profunda Femoral Vein: No evidence of thrombus. Normal compressibility and flow on color Doppler imaging. Femoral Vein: No evidence of thrombus. Normal compressibility, respiratory phasicity and response to augmentation. Popliteal Vein: No  evidence of thrombus. Normal compressibility, respiratory phasicity and response to augmentation. Calf Veins: No evidence of thrombus. Normal compressibility and flow on color Doppler imaging. Superficial Great Saphenous Vein: No evidence of thrombus. Normal compressibility. Venous Reflux:  None. Other Findings:  None. IMPRESSION: 1. Acute nonocclusive DVT involving the right popliteal vein extending into the veins of the right calf. 2. 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 No other evidence for acute DVT within the left lower extremity. Electronically Signed   By: Jeannine Boga M.D.   On: 04/07/2021 03:34      Subjective: Continues to have some pain in feet, but overall doing better, able to ambulate, feels ready to go home  Discharge Exam: Vitals:   04/08/21 0045 04/08/21 0408 04/08/21 0814 04/08/21 1114  BP: 120/67 113/70 118/64 121/66  Pulse: 91 81 79 81  Resp: _0 Temp: 99.5 F (37.5 C) 98.1 F (36.7 C) 97.8 F (36.6 C) 98.4 F (36.9 C)  TempSrc:  Oral Oral    SpO2: 97% 100% (!) 89% 96%  Weight:      Height:        General: Pt is alert, awake, not in acute distress Cardiovascular: RRR, S1/S2 +, no rubs, no gallops Respiratory: CTA bilaterally, no wheezing, no rhonchi Abdominal: Soft, NT, ND, bowel sounds + Extremities: no edema, no cyanosis    The results of significant diagnostics from this hospitalization (including imaging, microbiology, ancillary and laboratory) are listed below for reference.     Microbiology: Recent Results (from the past 240 hour(s))  SARS CORONAVIRUS 2 (TAT 6-24 HRS) Nasopharyngeal Nasopharyngeal Swab     Status: None   Collection Time: 04/04/21  4:06 AM   Specimen: Nasopharyngeal Swab  Result Value Ref Range Status   SARS Coronavirus 2 NEGATIVE NEGATIVE Final    Comment: (NOTE) SARS-CoV-2 target nucleic acids are NOT  DETECTED.  The SARS-CoV-2 RNA is generally detectable in upper and lower respiratory specimens during the acute phase of infection. Negative results do not preclude SARS-CoV-2 infection, do not rule out co-infections with other pathogens, and should not be used as the sole basis for treatment or other patient management decisions. Negative results must be combined with clinical observations, patient history, and epidemiological information. The expected result is Negative.  Fact Sheet for Patients: SugarRoll.be  Fact Sheet for Healthcare Providers: https://www.woods-mathews.com/  This test is not yet approved or cleared by the [place] FDA and  has been authorized for detection and/or diagnosis of SARS-CoV-2 by FDA under an Emergency Use Authorization (EUA). This EUA will remain  in effect (meaning this test can be used) for the duration of the COVID-19 declaration under Se ction 564(b)(1) of the Act, 21 U.S.C. section 360bbb-3(b)(1), unless the authorization is terminated or revoked sooner.  Performed at [facility], [place] [address], [place], [place] [address]   MRSA PCR Screening     Status: None   Collection Time: 04/04/21 11:31 AM   Specimen: Nasopharyngeal  Result Value Ref Range Status   MRSA by PCR NEGATIVE NEGATIVE Final    Comment:        The GeneXpert MRSA Assay (FDA approved for NASAL specimens only), is one component of a comprehensive MRSA colonization surveillance program. It is not intended to diagnose MRSA infection nor to guide or monitor treatment for MRSA infections. Performed at [facility], [address] [place], [place] [address]      Labs: BNP (last 3 results) Recent Labs    05/27/20 1539 12/02/20 1943 12/03/20 1630  BNP 464.9* 434.5* 749.4*   Basic Metabolic Panel: Recent Labs  Lab 04/04/21 0127 04/04/21 0622 04/05/21 1031 04/06/21 0450 04/07/21 0445  NA 136 138 138  139 137  137  K 4.0 4.0 3.4* 3.3* 4.0  4.0  CL 102 103 108 109 106  106  CO2 22 24 21* _0 GLUCOSE 162* 150* 189* 181* 165*  165*  BUN 26* 27* _1 CREATININE 2.07* 2.06* 1.53* 1.58* 1.75*  1.77*  CALCIUM 9.0 9.0 8.1* 8.2* 8.1*  8.2*  MG  --   --   --  1.9  --   PHOS  --   --   --   --  3.4   Liver Function Tests: Recent Labs  Lab 04/04/21 0127 04/04/21 0622 04/05/21 1031 04/06/21 0450 04/07/21 0445  AST _2 ALT _3 ALKPHOS 141* 135*  105 105 116  BILITOT 1.9* 1.8* 1.7* 1.4* 1.2  PROT 6.6 6.2* 5.5* 4.9* 5.3*  ALBUMIN 2.7* 2.6* 2.1* 1.9* 2.0*  1.9*   No results for input(s): LIPASE, AMYLASE in the last 168 hours. Recent Labs  Lab 04/04/21 0127 04/04/21 0622 04/05/21 0500 04/07/21 0445  AMMONIA 119* 88* 59* 45*   CBC: Recent Labs  Lab 04/04/21 0127 04/04/21 0622 04/05/21 1031 04/06/21 0450 04/07/21 0445 04/08/21 0913  WBC 5.6 5.9 6.7 7.2 8.0 8.2  NEUTROABS 4.1  --   --   --   --   --   HGB 10.9* 10.4* 9.5* 9.3* 9.9* 10.0*  HCT 32.7* 31.3* 29.4* 28.5* 30.2* 30.4*  MCV 83.0 81.5 80.8 82.1 81.0 80.4  PLT 110* 107* 101* 96* 97* 106*   Cardiac Enzymes: No results for input(s): CKTOTAL, CKMB, CKMBINDEX, TROPONINI in the last 168 hours. BNP: Invalid input(s): POCBNP CBG: Recent Labs  Lab 04/07/21 1537 04/07/21 1704 04/07/21 1936 04/08/21 0813 04/08/21 1111  GLUCAP 203* 187* 204* 137* 196*   D-Dimer No results for input(s): DDIMER in the last 72 hours. Hgb A1c Recent Labs    04/06/21 0500  HGBA1C 6.7*   Lipid Profile No results for input(s): CHOL, HDL, LDLCALC, TRIG, CHOLHDL, LDLDIRECT in the last 72 hours. Thyroid function studies No results for input(s): TSH, T4TOTAL, T3FREE, THYROIDAB in the last 72 hours.  Invalid input(s): FREET3 Anemia work up Recent Labs    04/07/21 0445  VITAMINB12 1,535*   Urinalysis    Component Value Date/Time   COLORURINE STRAW (A) 04/04/2021 0127   APPEARANCEUR  CLEAR (A) 04/04/2021 0127   LABSPEC 1.006 04/04/2021 0127   PHURINE 9.0 (H) 04/04/2021 0127   GLUCOSEU NEGATIVE 04/04/2021 0127   HGBUR NEGATIVE 04/04/2021 0127   BILIRUBINUR NEGATIVE 04/04/2021 0127   KETONESUR NEGATIVE 04/04/2021 0127   PROTEINUR NEGATIVE 04/04/2021 0127   NITRITE NEGATIVE 04/04/2021 0127   LEUKOCYTESUR LARGE (A) 04/04/2021 0127   Sepsis Labs Invalid input(s): PROCALCITONIN,  WBC,  LACTICIDVEN Microbiology Recent Results (from the past 240 hour(s))  SARS CORONAVIRUS 2 (TAT 6-24 HRS) Nasopharyngeal Nasopharyngeal Swab     Status: None   Collection Time: 04/04/21  4:06 AM   Specimen: Nasopharyngeal Swab  Result Value Ref Range Status   SARS Coronavirus 2 NEGATIVE NEGATIVE Final    Comment: (NOTE) SARS-CoV-2 target nucleic acids are NOT DETECTED.  The SARS-CoV-2 RNA is generally detectable in upper and lower respiratory specimens during the acute phase of infection. Negative results do not preclude SARS-CoV-2 infection, do not rule out co-infections with other pathogens, and should not be used as the sole basis for treatment or other patient management decisions. Negative results must be combined with clinical observations, patient history, and epidemiological information. The expected result is Negative.  Fact Sheet for Patients: SugarRoll.be  Fact Sheet for Healthcare Providers: https://www.woods-mathews.com/  This test is not yet approved or cleared by the [place] FDA and  has been authorized for detection and/or diagnosis of SARS-CoV-2 by FDA under an Emergency Use Authorization (EUA). This EUA will remain  in effect (meaning this test can be used) for the duration of the COVID-19 declaration under Se ction 564(b)(1) of the Act, 21 U.S.C. section 360bbb-3(b)(1), unless the authorization is terminated or revoked sooner.  Performed at [facility], [place] [address], [place],  [address]   MRSA  PCR Screening     Status: None   Collection Time: 04/04/21 11:31 AM   Specimen: Nasopharyngeal  Result  Value Ref Range Status   MRSA by PCR NEGATIVE NEGATIVE Final    Comment:        The GeneXpert MRSA Assay (FDA approved for NASAL specimens only), is one component of a comprehensive MRSA colonization surveillance program. It is not intended to diagnose MRSA infection nor to guide or monitor treatment for MRSA infections. Performed at [facility], [address] [place], [place] [address]      Time coordinating discharge: 51mns  SIGNED:   JKathie Dike MD  [facility] 04/08/2021, 4:21 PM   If 7PM-7AM, please contact night-coverage [email or web address]

## 2021-04-12 ENCOUNTER — Telehealth: Payer: Self-pay | Admitting: Gastroenterology

## 2021-04-12 ENCOUNTER — Other Ambulatory Visit: Payer: Self-pay

## 2021-04-12 ENCOUNTER — Inpatient Hospital Stay: Payer: Medicare HMO | Attending: Nurse Practitioner

## 2021-04-12 ENCOUNTER — Telehealth: Payer: Self-pay | Admitting: Internal Medicine

## 2021-04-12 DIAGNOSIS — N184 Chronic kidney disease, stage 4 (severe): Secondary | ICD-10-CM | POA: Diagnosis not present

## 2021-04-12 DIAGNOSIS — I5033 Acute on chronic diastolic (congestive) heart failure: Secondary | ICD-10-CM | POA: Diagnosis not present

## 2021-04-12 DIAGNOSIS — E1122 Type 2 diabetes mellitus with diabetic chronic kidney disease: Secondary | ICD-10-CM | POA: Diagnosis not present

## 2021-04-12 DIAGNOSIS — K922 Gastrointestinal hemorrhage, unspecified: Secondary | ICD-10-CM | POA: Diagnosis not present

## 2021-04-12 DIAGNOSIS — I82431 Acute embolism and thrombosis of right popliteal vein: Secondary | ICD-10-CM | POA: Diagnosis not present

## 2021-04-12 DIAGNOSIS — K7581 Nonalcoholic steatohepatitis (NASH): Secondary | ICD-10-CM | POA: Diagnosis not present

## 2021-04-12 DIAGNOSIS — E1151 Type 2 diabetes mellitus with diabetic peripheral angiopathy without gangrene: Secondary | ICD-10-CM | POA: Diagnosis not present

## 2021-04-12 DIAGNOSIS — I110 Hypertensive heart disease with heart failure: Secondary | ICD-10-CM | POA: Diagnosis not present

## 2021-04-12 DIAGNOSIS — I82512 Chronic embolism and thrombosis of left femoral vein: Secondary | ICD-10-CM | POA: Diagnosis not present

## 2021-04-12 DIAGNOSIS — E538 Deficiency of other specified B group vitamins: Secondary | ICD-10-CM | POA: Insufficient documentation

## 2021-04-12 DIAGNOSIS — I700 Atherosclerosis of aorta: Secondary | ICD-10-CM | POA: Diagnosis not present

## 2021-04-12 DIAGNOSIS — I2510 Atherosclerotic heart disease of native coronary artery without angina pectoris: Secondary | ICD-10-CM | POA: Diagnosis not present

## 2021-04-12 DIAGNOSIS — I5032 Chronic diastolic (congestive) heart failure: Secondary | ICD-10-CM | POA: Diagnosis not present

## 2021-04-12 MED ORDER — CYANOCOBALAMIN 1000 MCG/ML IJ SOLN
1000.0000 ug | Freq: Once | INTRAMUSCULAR | Status: AC
  Administered 2021-04-12: 1000 ug via INTRAMUSCULAR
  Filled 2021-04-12: qty 1

## 2021-04-12 NOTE — Telephone Encounter (Signed)
Please call to discuss cath instructions.,

## 2021-04-12 NOTE — Telephone Encounter (Signed)
Previously reviewed the patient's situation last week with Dr. Saunders Revel via Secure chat. As I was reviewing her chart to send her cath instructions for her 04/22/21 right heart cath, I noticed she was admitted for acute hepatic encephalopathy.  She was also diagnosed with a RLE DVT and placed on eliquis.   Will follow up with Dr. Saunders Revel to see if he would still like to proceed with her right heart cath on 04/22/21 in light of her recent admission and now on anticoagulation.

## 2021-04-12 NOTE — Telephone Encounter (Signed)
Protonix 40 mg once a day before breakfast, long-term Provide 90-day supply with 3 refills  RV

## 2021-04-12 NOTE — Telephone Encounter (Signed)
Call patient tell her if she is to start on   Medication pantoprazole (PROTONIX) 40 MG tablet [26225]   pantoprazole (PROTONIX) 40 MG tablet [[id number]]    nt tell her if she is to start on

## 2021-04-12 NOTE — Telephone Encounter (Signed)
Please advised

## 2021-04-13 DIAGNOSIS — I82431 Acute embolism and thrombosis of right popliteal vein: Secondary | ICD-10-CM | POA: Diagnosis not present

## 2021-04-13 DIAGNOSIS — E1151 Type 2 diabetes mellitus with diabetic peripheral angiopathy without gangrene: Secondary | ICD-10-CM | POA: Diagnosis not present

## 2021-04-13 DIAGNOSIS — I2510 Atherosclerotic heart disease of native coronary artery without angina pectoris: Secondary | ICD-10-CM | POA: Diagnosis not present

## 2021-04-13 DIAGNOSIS — I5032 Chronic diastolic (congestive) heart failure: Secondary | ICD-10-CM | POA: Diagnosis not present

## 2021-04-13 DIAGNOSIS — I82512 Chronic embolism and thrombosis of left femoral vein: Secondary | ICD-10-CM | POA: Diagnosis not present

## 2021-04-13 DIAGNOSIS — N184 Chronic kidney disease, stage 4 (severe): Secondary | ICD-10-CM | POA: Diagnosis not present

## 2021-04-13 DIAGNOSIS — I700 Atherosclerosis of aorta: Secondary | ICD-10-CM | POA: Diagnosis not present

## 2021-04-13 DIAGNOSIS — E1122 Type 2 diabetes mellitus with diabetic chronic kidney disease: Secondary | ICD-10-CM | POA: Diagnosis not present

## 2021-04-13 DIAGNOSIS — K7581 Nonalcoholic steatohepatitis (NASH): Secondary | ICD-10-CM | POA: Diagnosis not present

## 2021-04-13 DIAGNOSIS — I110 Hypertensive heart disease with heart failure: Secondary | ICD-10-CM | POA: Diagnosis not present

## 2021-04-13 MED ORDER — PANTOPRAZOLE SODIUM 40 MG PO TBEC: 40.0000 mg | DELAYED_RELEASE_TABLET | Freq: Every day | ORAL | 3 refills | Status: DC

## 2021-04-13 NOTE — Telephone Encounter (Signed)
Patient verbalized understanding of instructions and sent medication to the pharmacy

## 2021-04-13 NOTE — Telephone Encounter (Signed)
I called and spoke with the patient. I advised her I was planning to call her last week with her cath instructions, but we noticed she was admitted. She is advised that Dr. Saunders Revel has reviewed her chart/ hospital notes and recommends that we cancel her right heart cath for now as stated below.  The patient is currently scheduled to see Dr. Saunders Revel in the office on 05/11/21. She is advised to keep this appointment and Dr. Saunders Revel will re-evaluate her symptoms at that time.  The patient voices understanding and is agreeable.  I have called scheduling at [facility] and spoken with Pamala Hurry to cancel the patient's procedure.

## 2021-04-13 NOTE — Telephone Encounter (Signed)
I recommend canceling currently scheduled right heart catheterization and having Ms. Galanti follow-up in the office in a few weeks to reassess her symptoms.  It is possible that her leg edema may improve with treatment of her acute and chronic DVTs.  I would also favor deferring invasive procedures in the setting of ongoing anticoagulation, if possible.  Nelva Bush, MD [facility]

## 2021-04-13 NOTE — Addendum Note (Signed)
Addended by: Ulyess Blossom L on: 04/13/2021 08:43 AM   Modules accepted: Orders

## 2021-04-14 ENCOUNTER — Other Ambulatory Visit: Payer: Self-pay | Admitting: *Deleted

## 2021-04-14 DIAGNOSIS — I110 Hypertensive heart disease with heart failure: Secondary | ICD-10-CM | POA: Diagnosis not present

## 2021-04-14 DIAGNOSIS — I2510 Atherosclerotic heart disease of native coronary artery without angina pectoris: Secondary | ICD-10-CM | POA: Diagnosis not present

## 2021-04-14 DIAGNOSIS — I82512 Chronic embolism and thrombosis of left femoral vein: Secondary | ICD-10-CM | POA: Diagnosis not present

## 2021-04-14 DIAGNOSIS — K7581 Nonalcoholic steatohepatitis (NASH): Secondary | ICD-10-CM | POA: Diagnosis not present

## 2021-04-14 DIAGNOSIS — N184 Chronic kidney disease, stage 4 (severe): Secondary | ICD-10-CM | POA: Diagnosis not present

## 2021-04-14 DIAGNOSIS — E1122 Type 2 diabetes mellitus with diabetic chronic kidney disease: Secondary | ICD-10-CM | POA: Diagnosis not present

## 2021-04-14 DIAGNOSIS — I700 Atherosclerosis of aorta: Secondary | ICD-10-CM | POA: Diagnosis not present

## 2021-04-14 DIAGNOSIS — I5032 Chronic diastolic (congestive) heart failure: Secondary | ICD-10-CM | POA: Diagnosis not present

## 2021-04-14 DIAGNOSIS — I82431 Acute embolism and thrombosis of right popliteal vein: Secondary | ICD-10-CM | POA: Diagnosis not present

## 2021-04-14 DIAGNOSIS — E1151 Type 2 diabetes mellitus with diabetic peripheral angiopathy without gangrene: Secondary | ICD-10-CM | POA: Diagnosis not present

## 2021-04-14 NOTE — Patient Outreach (Signed)
[facility]) Care Management  04/14/2021  Crystal [name] 1951/10/18 [id number]  EMMI GENERAL DISCHARGE Date: 04/13/2021 Day: #4 Issues Loss interest, sad, hopeless, anxious, empty  RN spoke with pt today concerning the above emmi. Pt doing well without sucidical thoughts and she is doing well. States her therapeutist will be visiting soon for PT services so she requested a call back.  RN offered to over the next few days (receptive).  Raina Mina, RN Care Management Coordinator [place] Office [phone]

## 2021-04-15 DIAGNOSIS — E1122 Type 2 diabetes mellitus with diabetic chronic kidney disease: Secondary | ICD-10-CM | POA: Diagnosis not present

## 2021-04-15 DIAGNOSIS — I82431 Acute embolism and thrombosis of right popliteal vein: Secondary | ICD-10-CM | POA: Diagnosis not present

## 2021-04-15 DIAGNOSIS — I2510 Atherosclerotic heart disease of native coronary artery without angina pectoris: Secondary | ICD-10-CM | POA: Diagnosis not present

## 2021-04-15 DIAGNOSIS — I700 Atherosclerosis of aorta: Secondary | ICD-10-CM | POA: Diagnosis not present

## 2021-04-15 DIAGNOSIS — E1151 Type 2 diabetes mellitus with diabetic peripheral angiopathy without gangrene: Secondary | ICD-10-CM | POA: Diagnosis not present

## 2021-04-15 DIAGNOSIS — N760 Acute vaginitis: Secondary | ICD-10-CM | POA: Diagnosis not present

## 2021-04-15 DIAGNOSIS — N184 Chronic kidney disease, stage 4 (severe): Secondary | ICD-10-CM | POA: Diagnosis not present

## 2021-04-15 DIAGNOSIS — I110 Hypertensive heart disease with heart failure: Secondary | ICD-10-CM | POA: Diagnosis not present

## 2021-04-15 DIAGNOSIS — I82512 Chronic embolism and thrombosis of left femoral vein: Secondary | ICD-10-CM | POA: Diagnosis not present

## 2021-04-15 DIAGNOSIS — K7581 Nonalcoholic steatohepatitis (NASH): Secondary | ICD-10-CM | POA: Diagnosis not present

## 2021-04-15 DIAGNOSIS — I5032 Chronic diastolic (congestive) heart failure: Secondary | ICD-10-CM | POA: Diagnosis not present

## 2021-04-17 ENCOUNTER — Other Ambulatory Visit: Payer: Self-pay | Admitting: Gastroenterology

## 2021-04-18 ENCOUNTER — Telehealth: Payer: Self-pay | Admitting: Internal Medicine

## 2021-04-18 ENCOUNTER — Encounter: Payer: Self-pay | Admitting: *Deleted

## 2021-04-18 ENCOUNTER — Other Ambulatory Visit: Payer: Self-pay | Admitting: *Deleted

## 2021-04-18 NOTE — Patient Outreach (Signed)
[facility]) Care Management  04/18/2021  Kindell Strada Oceguera 09/11/1951 [id number]   Telephone Assessment-Successful (Enrolled CHF)  Spoke with pt concerning [facility] services and explained the available programs. Pt receptive and enrolled in the HF program. Provider education on the importance of daily monitoring with weights. Explained the importance of such weights and what to do if she gains  3 lbs overnight or 5 lbs within one week due to fluid retentions and what to do by contacting her provider for interventions.   Generated a plan of care related to her HF as requested and educated accordingly. Education and today's discussion note within the plan of care.  Will notify pt's provider of her enrollment and appropriate letters will be sent to the pt and provider accordingly. Will follow up next month as pt has agreed to a monthly follow up call. Pt has been educated on other disciplinarians with [facility] for pharmacy and/or social worker for needed resources if needed in the future.   Goals Addressed            This Visit's Progress   . VPX:[id number] My Quality of Life       Timeframe:  Long-Range Goal Priority:  Medium Start Date:  04/18/2021                           Expected End Date: 08/12/2021                      Follow Up Date 05/18/2021   - do one enjoyable thing every day - do something different, like talking to a new person or going to a new place, every day - name a health care proxy (decision maker) - spend time outdoors at least 3 times a week   Barriers: Health Behaviors  Why is this important?    Having a long-term illness can be scary.   It can also be stressful for you and your caregiver.   These steps may help.    Notes:  6/6 Encourage pt to completed the A.D she already has but has not completed and education on POA/HCPOA related. Encouraged pt to enjoy her outings however limited with HHPT/RN involved at this time for the next few weeks before services  will end with both. Pt states she is doing well however experiences some pain upon ambulating to both legs and takes prescribed pain medication when needed mostly at night but all is tolerable. Pt encouraged to wear her again compression stockings to prevent ongoing swelling that limits her ability to ambulate.     . IOE:VOJJK and Manage Symptoms-Heart Failure       Timeframe:  Short-Term Goal Priority:  Medium Start Date:   04/18/2021                          Expected End Date:   06/10/2021                   Follow Up Date 05/18/2021   - begin a heart failure diary - bring diary to all appointments - develop a rescue plan - follow rescue plan if symptoms flare-up - know when to call the doctor - track symptoms and what helps feel better or worse   Barriers: Health Behaviors Knowledge  Why is this important?    You will be able to handle your symptoms better if you keep track of  them.   Making some simple changes to your lifestyle will help.   Eating healthy is one thing you can do to take good care of yourself.    Notes:  6/6- Pt verbalized an understanding via teach back on HF symptoms and what to do if acute symptoms should occur by contact her heart doctor related to 3lbs overnight and 5lbs with one week. Verified pdg appointments and transportation availability. Will send [facility] calendar and education information packet and encouraged pt to document all weights for providers to view on her upcoming appointments.     Linward Headland and Keep All Appointments       Timeframe:  Short-Term Goal Priority:  Medium Start Date:   04/18/2021                          Expected End Date: 06/10/2021                      Follow Up Date 05/18/2021   - arrange a ride through an agency 1 week before appointment - ask family or friend for a ride - call to cancel if needed - keep a calendar with prescription refill dates - keep a calendar with appointment dates   Barriers: Health Behaviors  Why is this  important?    Part of staying healthy is seeing the doctor for follow-up care.   If you forget your appointments, there are some things you can do to stay on track.    Notes:  6/6: Pt verified she has sufficient transportation to all her medical appointments with no delays or issues. Will discuss other options for transportation if needed for future transports to her provider appointments however [place] but [facility] care-guides are able to assist if needed in the future.        Raina Mina, RN Care Management Coordinator [place] Office [phone]

## 2021-04-18 NOTE — Telephone Encounter (Signed)
Spoke to pt. Most recent labs prior to discharge on 04/07/21.  Pt asking if she needs labs prior to upcoming visit on 05/11/21.   Pt's weight is 219 lb this AM. Last week "stayed pretty consistent around 221lb". Denies incr shortness of breath.  Some incr swelling in bilateral legs, tops of feet and toes.  Otherwise, no other changes in symptoms.   Notified pt I will confirm with Dr. Saunders Revel if he would like labs prior to her f/u visit on 6/29.

## 2021-04-18 NOTE — Telephone Encounter (Signed)
No need to check labs prior to visit.  We can draw labs at the visit, if needed, based on her clinical picture.  Nelva Bush, MD [facility]

## 2021-04-18 NOTE — Telephone Encounter (Signed)
Patient wants to know if labs needed prior to upcoming visit .  Please advise .

## 2021-04-19 DIAGNOSIS — I82512 Chronic embolism and thrombosis of left femoral vein: Secondary | ICD-10-CM | POA: Diagnosis not present

## 2021-04-19 DIAGNOSIS — I2510 Atherosclerotic heart disease of native coronary artery without angina pectoris: Secondary | ICD-10-CM | POA: Diagnosis not present

## 2021-04-19 DIAGNOSIS — N184 Chronic kidney disease, stage 4 (severe): Secondary | ICD-10-CM | POA: Diagnosis not present

## 2021-04-19 DIAGNOSIS — K7581 Nonalcoholic steatohepatitis (NASH): Secondary | ICD-10-CM | POA: Diagnosis not present

## 2021-04-19 DIAGNOSIS — I5032 Chronic diastolic (congestive) heart failure: Secondary | ICD-10-CM | POA: Diagnosis not present

## 2021-04-19 DIAGNOSIS — I110 Hypertensive heart disease with heart failure: Secondary | ICD-10-CM | POA: Diagnosis not present

## 2021-04-19 DIAGNOSIS — E1122 Type 2 diabetes mellitus with diabetic chronic kidney disease: Secondary | ICD-10-CM | POA: Diagnosis not present

## 2021-04-19 DIAGNOSIS — I82431 Acute embolism and thrombosis of right popliteal vein: Secondary | ICD-10-CM | POA: Diagnosis not present

## 2021-04-19 DIAGNOSIS — I700 Atherosclerosis of aorta: Secondary | ICD-10-CM | POA: Diagnosis not present

## 2021-04-19 DIAGNOSIS — E1151 Type 2 diabetes mellitus with diabetic peripheral angiopathy without gangrene: Secondary | ICD-10-CM | POA: Diagnosis not present

## 2021-04-19 NOTE — Telephone Encounter (Signed)
Notified pt of Dr. Darnelle Bos message below. Left detailed message on pt's vm (DPR approved).  Advised pt call back with any questions, otherwise will see her at f/u 05/11/21.

## 2021-04-20 DIAGNOSIS — R195 Other fecal abnormalities: Secondary | ICD-10-CM | POA: Diagnosis not present

## 2021-04-20 DIAGNOSIS — D500 Iron deficiency anemia secondary to blood loss (chronic): Secondary | ICD-10-CM | POA: Diagnosis not present

## 2021-04-20 DIAGNOSIS — Z6837 Body mass index (BMI) 37.0-37.9, adult: Secondary | ICD-10-CM | POA: Diagnosis not present

## 2021-04-20 DIAGNOSIS — I509 Heart failure, unspecified: Secondary | ICD-10-CM | POA: Diagnosis not present

## 2021-04-21 DIAGNOSIS — I82512 Chronic embolism and thrombosis of left femoral vein: Secondary | ICD-10-CM | POA: Diagnosis not present

## 2021-04-21 DIAGNOSIS — K7581 Nonalcoholic steatohepatitis (NASH): Secondary | ICD-10-CM | POA: Diagnosis not present

## 2021-04-21 DIAGNOSIS — N184 Chronic kidney disease, stage 4 (severe): Secondary | ICD-10-CM | POA: Diagnosis not present

## 2021-04-21 DIAGNOSIS — I82431 Acute embolism and thrombosis of right popliteal vein: Secondary | ICD-10-CM | POA: Diagnosis not present

## 2021-04-21 DIAGNOSIS — E1151 Type 2 diabetes mellitus with diabetic peripheral angiopathy without gangrene: Secondary | ICD-10-CM | POA: Diagnosis not present

## 2021-04-21 DIAGNOSIS — I110 Hypertensive heart disease with heart failure: Secondary | ICD-10-CM | POA: Diagnosis not present

## 2021-04-21 DIAGNOSIS — E1122 Type 2 diabetes mellitus with diabetic chronic kidney disease: Secondary | ICD-10-CM | POA: Diagnosis not present

## 2021-04-21 DIAGNOSIS — I700 Atherosclerosis of aorta: Secondary | ICD-10-CM | POA: Diagnosis not present

## 2021-04-21 DIAGNOSIS — I5032 Chronic diastolic (congestive) heart failure: Secondary | ICD-10-CM | POA: Diagnosis not present

## 2021-04-21 DIAGNOSIS — I2510 Atherosclerotic heart disease of native coronary artery without angina pectoris: Secondary | ICD-10-CM | POA: Diagnosis not present

## 2021-04-22 ENCOUNTER — Ambulatory Visit: Admit: 2021-04-22 | Payer: Medicare HMO | Admitting: Internal Medicine

## 2021-04-22 DIAGNOSIS — R609 Edema, unspecified: Secondary | ICD-10-CM

## 2021-04-22 DIAGNOSIS — K7469 Other cirrhosis of liver: Secondary | ICD-10-CM

## 2021-04-22 SURGERY — RIGHT HEART CATH
Anesthesia: Moderate Sedation

## 2021-04-25 ENCOUNTER — Telehealth: Payer: Self-pay

## 2021-04-25 ENCOUNTER — Encounter: Payer: Self-pay | Admitting: Nurse Practitioner

## 2021-04-25 DIAGNOSIS — D509 Iron deficiency anemia, unspecified: Secondary | ICD-10-CM

## 2021-04-25 NOTE — Telephone Encounter (Signed)
Patient is calling because she states she went to her pcp office at [facility] and saw doctor Donnelly Stager. She states that he found blood in her stool. She states she does not know what kind of test they did all she knows she that they drew 4 tubes of blood. Asked patient if she noticed blood in stool she said no her stools are black and tarry though. She denies any abdominal pain constipation or diarrhea. She states she has had a lot of indigestion. Called the [facility] to see if they could fax [place] the office visit notes and labs. I had to leave a message for call back. Left a detail message   Office number is [phone]

## 2021-04-25 NOTE — Progress Notes (Signed)
Crystal [name], She is seeing Dr. Tasia Catchings on 6/30? Is that an error?

## 2021-04-26 ENCOUNTER — Other Ambulatory Visit: Payer: Self-pay | Admitting: Gastroenterology

## 2021-04-26 DIAGNOSIS — E1151 Type 2 diabetes mellitus with diabetic peripheral angiopathy without gangrene: Secondary | ICD-10-CM | POA: Diagnosis not present

## 2021-04-26 DIAGNOSIS — K7581 Nonalcoholic steatohepatitis (NASH): Secondary | ICD-10-CM | POA: Diagnosis not present

## 2021-04-26 DIAGNOSIS — I700 Atherosclerosis of aorta: Secondary | ICD-10-CM | POA: Diagnosis not present

## 2021-04-26 DIAGNOSIS — I110 Hypertensive heart disease with heart failure: Secondary | ICD-10-CM | POA: Diagnosis not present

## 2021-04-26 DIAGNOSIS — I82431 Acute embolism and thrombosis of right popliteal vein: Secondary | ICD-10-CM | POA: Diagnosis not present

## 2021-04-26 DIAGNOSIS — D509 Iron deficiency anemia, unspecified: Secondary | ICD-10-CM | POA: Diagnosis not present

## 2021-04-26 DIAGNOSIS — I82512 Chronic embolism and thrombosis of left femoral vein: Secondary | ICD-10-CM | POA: Diagnosis not present

## 2021-04-26 DIAGNOSIS — N184 Chronic kidney disease, stage 4 (severe): Secondary | ICD-10-CM | POA: Diagnosis not present

## 2021-04-26 DIAGNOSIS — I5032 Chronic diastolic (congestive) heart failure: Secondary | ICD-10-CM | POA: Diagnosis not present

## 2021-04-26 DIAGNOSIS — E1122 Type 2 diabetes mellitus with diabetic chronic kidney disease: Secondary | ICD-10-CM | POA: Diagnosis not present

## 2021-04-26 DIAGNOSIS — I2510 Atherosclerotic heart disease of native coronary artery without angina pectoris: Secondary | ICD-10-CM | POA: Diagnosis not present

## 2021-04-26 NOTE — Addendum Note (Signed)
Addended by: Ulyess Blossom L on: 04/26/2021 10:42 AM   Modules accepted: Orders

## 2021-04-26 NOTE — Telephone Encounter (Signed)
Crystal [name]  Let's repeat CBC today  RV

## 2021-04-26 NOTE — Telephone Encounter (Signed)
Order the lab and patient will come to our office today

## 2021-04-27 ENCOUNTER — Other Ambulatory Visit: Payer: Self-pay

## 2021-04-27 ENCOUNTER — Telehealth: Payer: Self-pay

## 2021-04-27 ENCOUNTER — Inpatient Hospital Stay
Admission: EM | Admit: 2021-04-27 | Discharge: 2021-05-01 | DRG: 377 | Disposition: A | Discharge status: Home Health | Payer: Medicare HMO | Attending: Internal Medicine | Admitting: Internal Medicine

## 2021-04-27 DIAGNOSIS — K921 Melena: Secondary | ICD-10-CM | POA: Diagnosis not present

## 2021-04-27 DIAGNOSIS — K31819 Angiodysplasia of stomach and duodenum without bleeding: Secondary | ICD-10-CM | POA: Diagnosis not present

## 2021-04-27 DIAGNOSIS — I82409 Acute embolism and thrombosis of unspecified deep veins of unspecified lower extremity: Secondary | ICD-10-CM | POA: Diagnosis present

## 2021-04-27 DIAGNOSIS — Y838 Other surgical procedures as the cause of abnormal reaction of the patient, or of later complication, without mention of misadventure at the time of the procedure: Secondary | ICD-10-CM | POA: Diagnosis not present

## 2021-04-27 DIAGNOSIS — K7469 Other cirrhosis of liver: Secondary | ICD-10-CM | POA: Diagnosis present

## 2021-04-27 DIAGNOSIS — K7290 Hepatic failure, unspecified without coma: Secondary | ICD-10-CM | POA: Diagnosis not present

## 2021-04-27 DIAGNOSIS — J698 Pneumonitis due to inhalation of other solids and liquids: Secondary | ICD-10-CM | POA: Diagnosis not present

## 2021-04-27 DIAGNOSIS — K922 Gastrointestinal hemorrhage, unspecified: Secondary | ICD-10-CM

## 2021-04-27 DIAGNOSIS — I82512 Chronic embolism and thrombosis of left femoral vein: Secondary | ICD-10-CM | POA: Diagnosis present

## 2021-04-27 DIAGNOSIS — D61818 Other pancytopenia: Secondary | ICD-10-CM | POA: Diagnosis not present

## 2021-04-27 DIAGNOSIS — Q2730 Arteriovenous malformation, site unspecified: Secondary | ICD-10-CM | POA: Diagnosis not present

## 2021-04-27 DIAGNOSIS — Z95828 Presence of other vascular implants and grafts: Secondary | ICD-10-CM | POA: Diagnosis not present

## 2021-04-27 DIAGNOSIS — N184 Chronic kidney disease, stage 4 (severe): Secondary | ICD-10-CM | POA: Diagnosis present

## 2021-04-27 DIAGNOSIS — D62 Acute posthemorrhagic anemia: Secondary | ICD-10-CM | POA: Diagnosis not present

## 2021-04-27 DIAGNOSIS — I5032 Chronic diastolic (congestive) heart failure: Secondary | ICD-10-CM | POA: Diagnosis not present

## 2021-04-27 DIAGNOSIS — R918 Other nonspecific abnormal finding of lung field: Secondary | ICD-10-CM | POA: Diagnosis not present

## 2021-04-27 DIAGNOSIS — J9589 Other postprocedural complications and disorders of respiratory system, not elsewhere classified: Secondary | ICD-10-CM | POA: Diagnosis not present

## 2021-04-27 DIAGNOSIS — M1990 Unspecified osteoarthritis, unspecified site: Secondary | ICD-10-CM | POA: Diagnosis not present

## 2021-04-27 DIAGNOSIS — D509 Iron deficiency anemia, unspecified: Secondary | ICD-10-CM | POA: Diagnosis present

## 2021-04-27 DIAGNOSIS — R509 Fever, unspecified: Secondary | ICD-10-CM

## 2021-04-27 DIAGNOSIS — R252 Cramp and spasm: Secondary | ICD-10-CM | POA: Diagnosis not present

## 2021-04-27 DIAGNOSIS — I82401 Acute embolism and thrombosis of unspecified deep veins of right lower extremity: Secondary | ICD-10-CM | POA: Diagnosis not present

## 2021-04-27 DIAGNOSIS — I2510 Atherosclerotic heart disease of native coronary artery without angina pectoris: Secondary | ICD-10-CM | POA: Diagnosis present

## 2021-04-27 DIAGNOSIS — Z8249 Family history of ischemic heart disease and other diseases of the circulatory system: Secondary | ICD-10-CM | POA: Diagnosis not present

## 2021-04-27 DIAGNOSIS — Z20822 Contact with and (suspected) exposure to covid-19: Secondary | ICD-10-CM | POA: Diagnosis not present

## 2021-04-27 DIAGNOSIS — D696 Thrombocytopenia, unspecified: Secondary | ICD-10-CM | POA: Diagnosis present

## 2021-04-27 DIAGNOSIS — D500 Iron deficiency anemia secondary to blood loss (chronic): Secondary | ICD-10-CM | POA: Diagnosis not present

## 2021-04-27 DIAGNOSIS — E1122 Type 2 diabetes mellitus with diabetic chronic kidney disease: Secondary | ICD-10-CM | POA: Diagnosis present

## 2021-04-27 DIAGNOSIS — Z7901 Long term (current) use of anticoagulants: Secondary | ICD-10-CM | POA: Diagnosis not present

## 2021-04-27 DIAGNOSIS — B1920 Unspecified viral hepatitis C without hepatic coma: Secondary | ICD-10-CM | POA: Diagnosis present

## 2021-04-27 DIAGNOSIS — K8020 Calculus of gallbladder without cholecystitis without obstruction: Secondary | ICD-10-CM | POA: Diagnosis not present

## 2021-04-27 DIAGNOSIS — Z86718 Personal history of other venous thrombosis and embolism: Secondary | ICD-10-CM | POA: Diagnosis not present

## 2021-04-27 DIAGNOSIS — R9431 Abnormal electrocardiogram [ECG] [EKG]: Secondary | ICD-10-CM | POA: Diagnosis not present

## 2021-04-27 DIAGNOSIS — J690 Pneumonitis due to inhalation of food and vomit: Secondary | ICD-10-CM | POA: Diagnosis not present

## 2021-04-27 DIAGNOSIS — K31811 Angiodysplasia of stomach and duodenum with bleeding: Secondary | ICD-10-CM | POA: Diagnosis not present

## 2021-04-27 DIAGNOSIS — R161 Splenomegaly, not elsewhere classified: Secondary | ICD-10-CM | POA: Diagnosis not present

## 2021-04-27 DIAGNOSIS — K7460 Unspecified cirrhosis of liver: Secondary | ICD-10-CM

## 2021-04-27 DIAGNOSIS — Z8719 Personal history of other diseases of the digestive system: Secondary | ICD-10-CM | POA: Diagnosis not present

## 2021-04-27 DIAGNOSIS — E119 Type 2 diabetes mellitus without complications: Secondary | ICD-10-CM | POA: Diagnosis present

## 2021-04-27 DIAGNOSIS — K7581 Nonalcoholic steatohepatitis (NASH): Secondary | ICD-10-CM | POA: Diagnosis not present

## 2021-04-27 LAB — CBC
HCT: 19.1 % — ABNORMAL LOW (ref 36.0–46.0)
HCT: 16.5 % — ABNORMAL LOW (ref 36.0–46.0)
HCT: 22.4 % — ABNORMAL LOW (ref 36.0–46.0)
Hematocrit: 17.6 % — CL (ref 34.0–46.6)
Hemoglobin: 5.5 g/dL — CL (ref 11.1–15.9)
Hemoglobin: 5.9 g/dL — ABNORMAL LOW (ref 12.0–15.0)
Hemoglobin: 5.3 g/dL — ABNORMAL LOW (ref 12.0–15.0)
Hemoglobin: 7.4 g/dL — ABNORMAL LOW (ref 12.0–15.0)
MCH: 25.9 pg — ABNORMAL LOW (ref 26.6–33.0)
MCH: 26 pg (ref 26.0–34.0)
MCH: 26.4 pg (ref 26.0–34.0)
MCH: 27.4 pg (ref 26.0–34.0)
MCHC: 31.3 g/dL — ABNORMAL LOW (ref 31.5–35.7)
MCHC: 30.9 g/dL (ref 30.0–36.0)
MCHC: 32.1 g/dL (ref 30.0–36.0)
MCHC: 33 g/dL (ref 30.0–36.0)
MCV: 83 fL (ref 79–97)
MCV: 84.1 fL (ref 80.0–100.0)
MCV: 82.1 fL (ref 80.0–100.0)
MCV: 83 fL (ref 80.0–100.0)
Platelets: 126 10*3/uL — ABNORMAL LOW (ref 150–450)
Platelets: 132 10*3/uL — ABNORMAL LOW (ref 150–400)
Platelets: 106 10*3/uL — ABNORMAL LOW (ref 150–400)
Platelets: 102 10*3/uL — ABNORMAL LOW (ref 150–400)
RBC: 2.12 x10E6/uL — CL (ref 3.77–5.28)
RBC: 2.27 MIL/uL — ABNORMAL LOW (ref 3.87–5.11)
RBC: 2.01 MIL/uL — ABNORMAL LOW (ref 3.87–5.11)
RBC: 2.7 MIL/uL — ABNORMAL LOW (ref 3.87–5.11)
RDW: 17.3 % — ABNORMAL HIGH (ref 11.7–15.4)
RDW: 18.8 % — ABNORMAL HIGH (ref 11.5–15.5)
RDW: 18.7 % — ABNORMAL HIGH (ref 11.5–15.5)
RDW: 17 % — ABNORMAL HIGH (ref 11.5–15.5)
WBC: 4.3 10*3/uL (ref 3.4–10.8)
WBC: 3.8 10*3/uL — ABNORMAL LOW (ref 4.0–10.5)
WBC: 3.2 10*3/uL — ABNORMAL LOW (ref 4.0–10.5)
WBC: 3.8 10*3/uL — ABNORMAL LOW (ref 4.0–10.5)
nRBC: 0 % (ref 0.0–0.2)
nRBC: 0 % (ref 0.0–0.2)
nRBC: 0 % (ref 0.0–0.2)

## 2021-04-27 LAB — COMPREHENSIVE METABOLIC PANEL
ALT: 12 U/L (ref 0–44)
AST: 27 U/L (ref 15–41)
Albumin: 2.1 g/dL — ABNORMAL LOW (ref 3.5–5.0)
Alkaline Phosphatase: 130 U/L — ABNORMAL HIGH (ref 38–126)
Anion gap: 11 (ref 5–15)
BUN: 26 mg/dL — ABNORMAL HIGH (ref 8–23)
CO2: 23 mmol/L (ref 22–32)
Calcium: 8 mg/dL — ABNORMAL LOW (ref 8.9–10.3)
Chloride: 96 mmol/L — ABNORMAL LOW (ref 98–111)
Creatinine, Ser: 1.88 mg/dL — ABNORMAL HIGH (ref 0.44–1.00)
GFR, Estimated: 29 mL/min — ABNORMAL LOW (ref >60)
Glucose, Bld: 131 mg/dL — ABNORMAL HIGH (ref 70–99)
Potassium: 3.8 mmol/L (ref 3.5–5.1)
Sodium: 130 mmol/L — ABNORMAL LOW (ref 135–145)
Total Bilirubin: 1.3 mg/dL — ABNORMAL HIGH (ref 0.3–1.2)
Total Protein: 5.5 g/dL — ABNORMAL LOW (ref 6.5–8.1)

## 2021-04-27 LAB — AMMONIA: Ammonia: 19 umol/L (ref 9–35)

## 2021-04-27 LAB — RESP PANEL BY RT-PCR (FLU A&B, COVID) ARPGX2
Influenza A by PCR: NEGATIVE
Influenza B by PCR: NEGATIVE
SARS Coronavirus 2 by RT PCR: NEGATIVE

## 2021-04-27 LAB — PROTIME-INR
INR: 1.3 — ABNORMAL HIGH (ref 0.8–1.2)
Prothrombin Time: 16.1 seconds — ABNORMAL HIGH (ref 11.4–15.2)

## 2021-04-27 LAB — APTT: aPTT: 27 seconds (ref 24–36)

## 2021-04-27 LAB — BRAIN NATRIURETIC PEPTIDE: B Natriuretic Peptide: 314.1 pg/mL — ABNORMAL HIGH (ref 0.0–100.0)

## 2021-04-27 MED ORDER — SODIUM CHLORIDE 0.9 % IV SOLN
25.0000 ug/h | INTRAVENOUS | Status: DC
  Administered 2021-04-27 – 2021-04-29 (×2): 25 ug/h via INTRAVENOUS
  Filled 2021-04-27 (×8): qty 1

## 2021-04-27 MED ORDER — ONDANSETRON HCL 4 MG/2ML IJ SOLN: 4.0000 mg | Freq: Three times a day (TID) | INTRAMUSCULAR | Status: DC | PRN

## 2021-04-27 MED ORDER — OCTREOTIDE LOAD VIA INFUSION
50.0000 ug | Freq: Once | INTRAVENOUS | Status: AC
  Administered 2021-04-27: 50 ug via INTRAVENOUS
  Filled 2021-04-27: qty 25

## 2021-04-27 MED ORDER — TORSEMIDE 20 MG PO TABS
40.0000 mg | ORAL_TABLET | Freq: Two times a day (BID) | ORAL | Status: DC
  Administered 2021-04-28 – 2021-05-01 (×6): 40 mg via ORAL
  Filled 2021-04-27 (×8): qty 2

## 2021-04-27 MED ORDER — HYDROXYZINE HCL 25 MG PO TABS: 25.0000 mg | ORAL_TABLET | Freq: Two times a day (BID) | ORAL | Status: DC | PRN

## 2021-04-27 MED ORDER — SODIUM BICARBONATE 650 MG PO TABS
1300.0000 mg | ORAL_TABLET | Freq: Two times a day (BID) | ORAL | Status: DC
  Administered 2021-04-27 – 2021-05-01 (×7): 1300 mg via ORAL
  Filled 2021-04-27 (×7): qty 2

## 2021-04-27 MED ORDER — SODIUM CHLORIDE 0.9 % IV SOLN
8.0000 mg/h | INTRAVENOUS | Status: DC
  Administered 2021-04-27 – 2021-04-29 (×3): 8 mg/h via INTRAVENOUS
  Filled 2021-04-27 (×5): qty 80

## 2021-04-27 MED ORDER — RIFAXIMIN 550 MG PO TABS
550.0000 mg | ORAL_TABLET | Freq: Two times a day (BID) | ORAL | Status: DC
  Administered 2021-04-27 – 2021-05-01 (×7): 550 mg via ORAL
  Filled 2021-04-27 (×9): qty 1

## 2021-04-27 MED ORDER — SODIUM CHLORIDE 0.9 % IV SOLN
2.0000 g | INTRAVENOUS | Status: DC
  Administered 2021-04-27 – 2021-04-28 (×2): 2 g via INTRAVENOUS
  Filled 2021-04-27: qty 20
  Filled 2021-04-27: qty 2
  Filled 2021-04-27 (×2): qty 20

## 2021-04-27 MED ORDER — PANTOPRAZOLE 80MG IVPB - SIMPLE MED
80.0000 mg | Freq: Once | INTRAVENOUS | Status: AC
  Administered 2021-04-27: 80 mg via INTRAVENOUS
  Filled 2021-04-27: qty 100

## 2021-04-27 MED ORDER — ACETAMINOPHEN 325 MG PO TABS
650.0000 mg | ORAL_TABLET | Freq: Four times a day (QID) | ORAL | Status: DC | PRN
  Administered 2021-04-29: 650 mg via ORAL
  Filled 2021-04-27: qty 2

## 2021-04-27 MED ORDER — PANTOPRAZOLE SODIUM 40 MG IV SOLR
40.0000 mg | Freq: Two times a day (BID) | INTRAVENOUS | Status: DC
  Administered 2021-05-01: 40 mg via INTRAVENOUS
  Filled 2021-04-27: qty 40

## 2021-04-27 MED ORDER — SPIRONOLACTONE 25 MG PO TABS
100.0000 mg | ORAL_TABLET | Freq: Every day | ORAL | Status: DC
  Administered 2021-04-29 – 2021-05-01 (×3): 100 mg via ORAL
  Filled 2021-04-27 (×2): qty 4
  Filled 2021-04-27: qty 1
  Filled 2021-04-27: qty 4

## 2021-04-27 MED ORDER — SODIUM CHLORIDE 0.9 % IV SOLN
10.0000 mL/h | Freq: Once | INTRAVENOUS | Status: AC
  Administered 2021-04-27: 10 mL/h via INTRAVENOUS

## 2021-04-27 MED ORDER — TETRAHYDROZOLINE HCL 0.05 % OP SOLN
1.0000 [drp] | Freq: Every day | OPHTHALMIC | Status: DC | PRN
  Filled 2021-04-27: qty 15

## 2021-04-27 MED ORDER — LACTULOSE 10 GM/15ML PO SOLN
20.0000 g | Freq: Three times a day (TID) | ORAL | Status: DC
  Administered 2021-04-29 – 2021-05-01 (×5): 20 g via ORAL
  Filled 2021-04-27 (×6): qty 30

## 2021-04-27 MED ORDER — OXYCODONE HCL 5 MG PO TABS
5.0000 mg | ORAL_TABLET | Freq: Two times a day (BID) | ORAL | Status: DC | PRN
  Administered 2021-04-27 – 2021-04-29 (×3): 5 mg via ORAL
  Filled 2021-04-27 (×3): qty 1

## 2021-04-27 NOTE — Telephone Encounter (Signed)
Patience is calling from [facility] with a critical lab for this patient about her CBC result. She states the Hemoglobin and Hematocrit are critical. Informed them that the provider has reviewed results and sent the patient to the ER. Patient is currently in the ER

## 2021-04-27 NOTE — Consult Note (Signed)
[name], MD [address]  [place]  [place], [place] [address]  Main: [phone]  Fax: [phone] Pager: [phone]   Consultation  Referring Provider:     No ref. provider found Primary Care Physician:  Pcp, No Primary Gastroenterologist:  Dr. Sherri Sear         Reason for Consultation:     Melena, acute blood loss anemia  Date of Admission:  04/27/2021 Date of Consultation:  04/27/2021         HPI:   Crystal [name] is a 70 y.o. female with history of decompensated cirrhosis of liver, esophageal varices s/p TIPS, coil embolization of left gastric vein, known history of iron deficiency anemia, history of gave s/p APC, history of small bowel AVMs s/p APC.  Patient called our office 2 days ago with 2 weeks history of black tarry stool.  She said she has been experiencing black tarry stool ever since she was released from hospital on 5/27.  Outpatient hemoglobin revealed a significant drop to 5.5 from baseline of 10 on 04/08/2021.  Patient has been receiving parenteral iron therapy as needed by Dr. Mike Gip, hematologist.  She has an appointment to see Dr. Tasia Catchings on 6/30.  In the ER, patient was hemodynamically stable, repeat hemoglobin was 5.6, patient denies any epigastric pain, nausea or vomiting.  She does report severe bilateral leg cramps, she has gained significant weight from volume overload Patient is also found to have acute nonocclusive right lower extremity DVT during previous hospitalization.  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 If patient develops worse anemia recommend a push enteroscopy    EGD  03/08/2020 - A few non-bleeding angioectasias in the duodenum. Treated with argon plasma coagulation (APC). - Gastric antral vascular ectasia with bleeding. Treated with argon plasma coagulation (APC). - A single non-bleeding angioectasia in the stomach. Treated with bipolar cautery. - Normal gastroesophageal junction and esophagus. - No specimens collected.   EGD 11/19/2018 - Normal duodenal bulb and second portion of the duodenum. - Gastric antral vascular ectasia with bleeding. Treated with argon plasma coagulation (APC). - Normal gastroesophageal junction and esophagus. - No specimens collected.   EGD 08/03/2018 - Normal duodenal bulb and second portion of the duodenum. - Portal hypertensive gastropathy. - Bleeding large (> 5 mm) esophageal varices, ligation was unsuccessful. - No specimens collected.   EGD 04/22/2018 - Normal duodenal bulb and second portion of the duodenum. - Non-bleeding gastric ulcer with a clean ulcer base (Forrest Class III). - Portal hypertensive gastropathy. - Large (> 5 mm) esophageal varices. Incompletely eradicated. Banded x 2. - No specimens collected.   EGD 03/25/2018 - Two non-bleeding angioectasias in the duodenum. Treated with argon plasma coagulation (APC). - A few non-bleeding angioectasias in the stomach. Treated with argon plasma coagulation (APC). - Non-bleeding gastric ulcers with a clean ulcer base (Forrest Class III). - Non-bleeding large (> 5 mm) esophageal varices. Incompletely eradicated. Banded. - No specimens collected.   Colonoscopy 03/25/2018 - One 5 mm polyp in the ascending colon, removed with a cold snare. Resected and retrieved. - Rectal varices. - External hemorrhoids. - The examination was otherwise normal.  DIAGNOSIS:  A. COLON POLYP, ASCENDING; COLD SNARE:  - TUBULAR ADENOMA.  - NEGATIVE FOR HIGH-GRADE DYSPLASIA AND MALIGNANCY.   EGD 02/09/18 The examined duodenum was normal. The stomach was normal. Five columns of  non-bleeding grade III varices were found in the lower third of the esophagus,. Stigmata of recent bleeding were evident and red wale signs were present. Ten bands were successfully placed with incomplete eradication of varices. There was no bleeding during, and at the end, of the procedure.  Past Medical History:  Diagnosis Date  . Acute GI bleeding 02/09/2018  . Acute upper gastrointestinal bleeding 08/03/2018  . Anemia    TAKES IRON TAB  . Arthritis    SHOULDER  . B12 deficiency 02/18/2018  . Bleeding    GI  3/19  . Bronchitis    HX OF  . CHF (congestive heart failure) ([place])   . Cirrhosis ([place])   . Colon cancer screening   . Diabetes mellitus without complication ([place])    TYPE 2  . Fatty (change of) liver, not elsewhere classified   . Full dentures    UPPER AND LOWER  . Iron deficiency anemia due to chronic blood loss 02/18/2018  . Lung nodule, multiple   . Pernicious anemia 02/22/2018  . PUD (peptic ulcer disease)   . Raynaud's syndrome   . Upper GI bleeding 08/03/2018    Past Surgical History:  Procedure Laterality Date  . CATARACT EXTRACTION W/PHACO Right 02/06/2018   Procedure: CATARACT EXTRACTION PHACO AND INTRAOCULAR LENS PLACEMENT ([place]) RIGHT DIABETIC;  Surgeon: Leandrew Koyanagi, MD;  Location: [place];  Service: Ophthalmology;  Laterality: Right;  DIABETIC, ORAL MED  . CATARACT EXTRACTION W/PHACO Left 04/30/2018   Procedure: CATARACT EXTRACTION PHACO AND INTRAOCULAR LENS PLACEMENT ([facility]);  Surgeon: Leandrew Koyanagi, MD;  Location: [facility];  Service: Ophthalmology;  Laterality: Left;  [place] 01:04 AP% 21.3 CDE 13.66 Fluid pack lot # [id number]  . COLONOSCOPY WITH PROPOFOL N/A 03/25/2018   Procedure: COLONOSCOPY WITH PROPOFOL;  Surgeon: Lin Landsman, MD;  Location: [facility] END[facility]COPY;  Service: Gastroenterology;  Laterality: N/A;  . DENTAL SURGERY     EXTRACTIONS  . ELECTROMAGNETIC NAVIGATION BROCHOSCOPY Right 01/27/2019   Procedure: ELECTROMAGNETIC  NAVIGATION BRONCHOSCOPY;  Surgeon: [name], MD;  Location: [facility];  Service: Cardiopulmonary;  Laterality: Right;  . ENTEROSCOPY N/A 07/28/2020   Procedure: Push ENTEROSCOPY;  Surgeon: Lin Landsman, MD;  Location: [facility] ENDOSCOPY;  Service: Gastroenterology;  Laterality: N/A;  . ESOPHAGOGASTRODUODENOSCOPY N/A 08/03/2018   Procedure: ESOPHAGOGASTRODUODENOSCOPY (EGD);  Surgeon: Lin Landsman, MD;  Location: [facility] ENDOSCOPY;  Service: Gastroenterology;  Laterality: N/A;  . ESOPHAGOGASTRODUODENOSCOPY (EGD) WITH PROPOFOL N/A 02/10/2018   Procedure: ESOPHAGOGASTRODUODENOSCOPY (EGD) WITH PROPOFOL;  Surgeon: Jonathon Bellows, MD;  Location: [facility] ENDOSCOPY;  Service: Gastroenterology;  Laterality: N/A;  . ESOPHAGOGASTRODUODENOSCOPY (EGD) WITH PROPOFOL N/A 03/25/2018   Procedure: ESOPHAGOGASTRODUODENOSCOPY (EGD) WITH PROPOFOL;  Surgeon: Lin Landsman, MD;  Location: [facility] ENDOSCOPY;  Service: Gastroenterology;  Laterality: N/A;  . ESOPHAGOGASTRODUODENOSCOPY (EGD) WITH PROPOFOL N/A 04/22/2018   Procedure: ESOPHAGOGASTRODUODENOSCOPY (EGD) WITH PROPOFOL with band ligation;  Surgeon: Lin Landsman, MD;  Location: [place];  Service: Gastroenterology;  Laterality: N/A;  . ESOPHAGOGASTRODUODENOSCOPY (EGD) WITH PROPOFOL N/A 06/24/2018   Procedure: ESOPHAGOGASTRODUODENOSCOPY (EGD) WITH PROPOFOL;  Surgeon: Lin Landsman, MD;  Location: [facility] ENDOSCOPY;  Service: Gastroenterology;  Laterality: N/A;  . ESOPHAGOGASTRODUODENOSCOPY (EGD) WITH PROPOFOL N/A 11/19/2018   Procedure: ESOPHAGOGASTRODUODENOSCOPY (EGD) WITH PROPOFOL;  Surgeon: Lin Landsman, MD;  Location: [place];  Service:  Gastroenterology;  Laterality: N/A;  . ESOPHAGOGASTRODUODENOSCOPY (EGD) WITH PROPOFOL N/A 03/08/2020   Procedure: ESOPHAGOGASTRODUODENOSCOPY (EGD) WITH PROPOFOL;  Surgeon: Lin Landsman, MD;  Location: [facility] ENDOSCOPY;  Service: Gastroenterology;  Laterality: N/A;  . EYE SURGERY    . GIVENS CAPSULE  STUDY N/A 03/29/2020   Procedure: GIVENS CAPSULE STUDY;  Surgeon: Lin Landsman, MD;  Location: [facility] ENDOSCOPY;  Service: Gastroenterology;  Laterality: N/A;  . HEMORRHOID BANDING  03/25/2018   Procedure: HEMORRHOID BANDING;  Surgeon: Lin Landsman, MD;  Location: [facility] ENDOSCOPY;  Service: Gastroenterology;;  . IR EMBO ART  VEN HEMORR LYMPH EXTRAV  INC GUIDE ROADMAPPING  08/07/2018  . IR RADIOLOGIST EVAL & MGMT  09/03/2018  . IR RADIOLOGIST EVAL & MGMT  12/17/2018  . IR RADIOLOGIST EVAL & MGMT  06/24/2019  . IR RADIOLOGIST EVAL & MGMT  01/22/2020  . IR RADIOLOGIST EVAL & MGMT  02/09/2021  . IR TIPS  08/07/2018  . IR TRANSHEPATIC PORTOGRAM W HEMO  03/07/2021  . IR US GUIDE VASC ACCESS RIGHT  03/07/2021  . IR VENO/JUGULAR LEFT  03/07/2021  . PORTA CATH INSERTION N/A 07/30/2020   Procedure: PORTA CATH INSERTION;  Surgeon: Katha Cabal, MD;  Location: [place] CV [facility];  Service: Cardiovascular;  Laterality: N/A;  . RADIOLOGY WITH ANESTHESIA N/A 08/07/2018   Procedure: TIPS;  Surgeon: Corrie Mckusick, DO;  Location: [place];  Service: Anesthesiology;  Laterality: N/A;  . TONSILLECTOMY      Prior to Admission medications   Medication Sig Start Date End Date Taking? Authorizing Provider  acetaminophen (TYLENOL) 500 MG tablet Take 500 mg by mouth daily as needed for moderate pain or headache. Patient not taking: Reported on 04/18/2021    [provider]  apixaban (ELIQUIS) 5 MG TABS tablet Take 50m po bid for 7 days then 578mpo bid 04/08/21   MeKathie DikeMD  aspirin-sod bicarb-citric acid (ALKA-SELTZER) 325 MG TBEF tablet Take 650 mg by mouth every 6 (six) hours as needed (indigestion).    [provider]  benzonatate (TESSALON) 100 MG capsule Take 100 mg by mouth 4 (four) times daily as needed. Patient not taking: Reported on 04/18/2021 03/22/21   [provider]  cyanocobalamin (,VITAMIN B-12,) 1000 MCG/ML injection Inject 1,000 mcg into the muscle every 30  (thirty) days.     [provider]  hydrOXYzine (ATARAX/VISTARIL) 25 MG tablet Take 1 tablet (25 mg total) by mouth 2 (two) times daily as needed. 04/08/21   MeKathie DikeMD  lactulose (CHRONULAC) 10 GM/15ML solution Take 30 mLs (20 g total) by mouth 3 (three) times daily. 04/08/21   MeKathie DikeMD  Misc. Devices KIT Moderate compression hose 20-30 MM HG 10/14/19   Alyda Megna, RoTally DueMD  ondansetron (ZOFRAN) 4 MG tablet Take 4 mg by mouth every 8 (eight) hours as needed for nausea or vomiting.     [provider]  OneTouch Delica Lancets [id number] 2 (two) times daily. 12/01/19   [provider]  [facility] Center[facility] test strip 2 (two) times daily. 11/23/19   [provider]  oxyCODONE (OXY IR/ROXICODONE) 5 MG immediate release tablet Take 1 tablet (5 mg total) by mouth 2 (two) times daily as needed for moderate pain. 04/08/21   MeKathie DikeMD  pantoprazole (PROTONIX) 40 MG tablet Take 1 tablet (40 mg total) by mouth daily. 04/13/21   VaLin LandsmanMD  POTASSIUM CHLORIDE PO Take 15 mEq by mouth daily.    [provider]  promethazine-dextromethorphan (PROMETHAZINE-DM) 6.25-15 MG/5ML syrup Take 5 mLs by mouth as needed. 03/22/21   [provider]  sodium bicarbonate 650 MG tablet Take 1,300 mg by mouth 2 (two) times daily. 02/23/21   [provider]  spironolactone (ALDACTONE) 100 MG tablet TAKE 1 TABLET BY MOUTH EVERY DAY 04/18/21   Russie Gulledge, Tally Due, MD  Tetrahydrozoline HCl (VISINE OP) Place 1 drop into both eyes daily as needed (irritaiton).    [provider]  torsemide (DEMADEX) 20 MG tablet TAKE 2 TABLETS BY MOUTH 2 TIMES DAILY. 03/08/21   Loel Dubonnet, NP  Vitamin D, Ergocalciferol, (DRISDOL) 1.25 MG (50000 UNIT) CAPS capsule Take 50,000 Units by mouth every Sunday. 01/14/21   [provider]  XIFAXAN 550 MG TABS tablet Take 550 mg by mouth 2 (two) times daily. 06/24/20   [provider]     Current Facility-Administered Medications:  .  0.9 %  sodium chloride infusion, 10 mL/hr, Intravenous, Once, Ivor Costa, MD .  acetaminophen (TYLENOL) tablet 650 mg, 650 mg, Oral, Q6H PRN, Ivor Costa, MD .  cefTRIAXone (ROCEPHIN) 2 g in sodium chloride 0.9 % 100 mL IVPB, 2 g, Intravenous, Q24H, Breyon Sigg, Tally Due, MD .  Margrett Rud octreotide (SANDOSTATIN) 2 mcg/mL load via infusion 50 mcg, 50 mcg, Intravenous, Once, 50 mcg at 04/27/21 1330 **AND** octreotide (SANDOSTATIN) 500 mcg in sodium chloride 0.9 % 250 mL (2 mcg/mL) infusion, 25-50 mcg/hr, Intravenous, Continuous, Ivor Costa, MD, Last Rate: 12.5 mL/hr at 04/27/21 1330, 25 mcg/hr at 04/27/21 1330 .  ondansetron (ZOFRAN) injection 4 mg, 4 mg, Intravenous, Q8H PRN, Ivor Costa, MD .  pantoprazole (PROTONIX) 80 mg in sodium chloride 0.9 % 100 mL (0.8 mg/mL) infusion, 8 mg/hr, Intravenous, Continuous, Ivor Costa, MD, Paused at 04/27/21 1404 .  [START ON 05/01/2021] pantoprazole (PROTONIX) injection 40 mg, 40 mg, Intravenous, Q12H, Ivor Costa, MD  Current Outpatient Medications:  .  hydrOXYzine (ATARAX/VISTARIL) 25 MG tablet, Take 1 tablet (25 mg total) by mouth 2 (two) times daily as needed., Disp: 30 tablet, Rfl: 0 .  lactulose (CHRONULAC) 10 GM/15ML solution, Take 30 mLs (20 g total) by mouth 3 (three) times daily., Disp: 1800 mL, Rfl: 2 .  pantoprazole (PROTONIX) 40 MG tablet, Take 1 tablet (40 mg total) by mouth daily., Disp: 90 tablet, Rfl: 3 .  potassium chloride (KLOR-CON) 10 MEQ tablet, Take 10 mEq by mouth daily., Disp: , Rfl:  .  POTASSIUM CHLORIDE PO, Take 15 mEq by mouth daily., Disp: , Rfl:  .  sodium bicarbonate 650 MG tablet, Take 1,300 mg by mouth 2 (two) times daily., Disp: , Rfl:  .  spironolactone (ALDACTONE) 100 MG tablet, TAKE 1 TABLET BY MOUTH EVERY DAY, Disp: 30 tablet, Rfl: 2 .  torsemide (DEMADEX) 20 MG tablet, TAKE 2 TABLETS BY MOUTH 2 TIMES DAILY., Disp: 120 tablet, Rfl: 2 .  XIFAXAN 550 MG TABS tablet, Take 550 mg  by mouth 2 (two) times daily., Disp: , Rfl:  .  acetaminophen (TYLENOL) 500 MG tablet, Take 500 mg by mouth daily as needed for moderate pain or headache. (Patient not taking: No sig reported), Disp: , Rfl:  .  apixaban (ELIQUIS) 5 MG TABS tablet, Take 45m po bid for 7 days then 582mpo bid (Patient not taking: No sig reported), Disp: 60 tablet, Rfl: 1 .  aspirin-sod bicarb-citric acid (ALKA-SELTZER) 325 MG TBEF tablet, Take 650 mg by mouth every 6 (six) hours as needed (indigestion)., Disp: , Rfl:  .  benzonatate (TESSALON) 100  MG capsule, Take 100 mg by mouth 4 (four) times daily as needed. (Patient not taking: No sig reported), Disp: , Rfl:  .  cyanocobalamin (,VITAMIN B-12,) 1000 MCG/ML injection, Inject 1,000 mcg into the muscle every 30 (thirty) days. , Disp: , Rfl:  .  Misc. Devices KIT, Moderate compression hose 20-30 MM HG, Disp: 1 kit, Rfl: 0 .  ondansetron (ZOFRAN) 4 MG tablet, Take 4 mg by mouth every 8 (eight) hours as needed for nausea or vomiting. , Disp: , Rfl:  .  OneTouch Delica Lancets 66Y MISC, 2 (two) times daily., Disp: , Rfl:  .  ONETOUCH ULTRA test strip, 2 (two) times daily., Disp: , Rfl:  .  oxyCODONE (OXY IR/ROXICODONE) 5 MG immediate release tablet, Take 1 tablet (5 mg total) by mouth 2 (two) times daily as needed for moderate pain. (Patient not taking: No sig reported), Disp: 10 tablet, Rfl: 0 .  promethazine-dextromethorphan (PROMETHAZINE-DM) 6.25-15 MG/5ML syrup, Take 5 mLs by mouth as needed. (Patient not taking: No sig reported), Disp: , Rfl:  .  Tetrahydrozoline HCl (VISINE OP), Place 1 drop into both eyes daily as needed (irritaiton)., Disp: , Rfl:  .  Vitamin D, Ergocalciferol, (DRISDOL) 1.25 MG (50000 UNIT) CAPS capsule, Take 50,000 Units by mouth every Sunday., Disp: , Rfl:   Facility-Administered Medications Ordered in Other Encounters:  .  sodium chloride flush (NS) 0.9 % injection 10 mL, 10 mL, Intravenous, PRN, Mike Gip, Melissa C, MD, 10 mL at 09/14/20  1320  Family History  Problem Relation Age of Onset  . CAD Father   . Heart attack Father 43  . Cancer Maternal Uncle   . Seizures Mother      Social History   Tobacco Use  . Smoking status: Never  . Smokeless tobacco: Never  Vaping Use  . Vaping Use: Never used  Substance Use Topics  . Alcohol use: Not Currently    Comment: No EtOH for 30 years.  Never a heavy drinker.  . Drug use: Never    Allergies as of 04/27/2021  . (No Known Allergies)    Review of Systems:    All systems reviewed and negative except where noted in HPI.   Physical Exam:  Vital signs in last 24 hours: Temp:  [97.9 F (36.6 C)-98.5 F (36.9 C)] 98.1 F (36.7 C) (06/15 1545) Pulse Rate:  [60-70] 60 (06/15 1630) Resp:  [12-21] 13 (06/15 1630) BP: (102-123)/(52-73) 117/73 (06/15 1630) SpO2:  [96 %-98 %] 96 % (06/15 1630) Weight:  [96.2 kg] 96.2 kg (06/15 0939)   General:   Pleasant, cooperative in NAD Head:  Normocephalic and atraumatic. Eyes:   No icterus.   Conjunctiva pale, PERRLA. Ears:  Normal auditory acuity. Neck:  Supple; no masses or thyroidomegaly Lungs: Respirations even and unlabored. Lungs clear to auscultation bilaterally.   No wheezes, crackles, or rhonchi.  Heart:  Regular rate and rhythm;  Without murmur, clicks, rubs or gallops Abdomen:  Soft, nondistended, nontender. Normal bowel sounds. No appreciable masses or hepatomegaly.  No rebound or guarding.  Rectal:  Not performed. Msk:  Symmetrical without gross deformities.  Strength generalized weakness Extremities: 3+ edema, cyanosis or clubbing. Neurologic:  Alert and oriented x3;  grossly normal neurologically. Skin:  Intact without significant lesions or rashes. Cervical Nodes:  No significant cervical adenopathy. Psych:  Alert and cooperative. Normal affect.  LAB RESULTS: CBC Latest Ref Rng & Units 04/27/2021 04/27/2021 04/26/2021  WBC 4.0 - 10.5 K/uL 3.2(L) 3.8(L) 4.3  Hemoglobin 12.0 -  15.0 g/dL 5.3(L) 5.9(L) 5.5(LL)   Hematocrit 36.0 - 46.0 % 16.5(L) 19.1(L) 17.6(LL)  Platelets 150 - 400 K/uL 106(L) 132(L) 126(L)    BMET BMP Latest Ref Rng & Units 04/27/2021 04/07/2021 04/07/2021  Glucose 70 - 99 mg/dL 131(H) 165(H) 165(H)  BUN 8 - 23 mg/dL 26(H) 21 22  Creatinine 0.44 - 1.00 mg/dL 1.88(H) 1.75(H) 1.77(H)  BUN/Creat Ratio 6 - 22 (calc) - - -  Sodium 135 - 145 mmol/L 130(L) 137 137  Potassium 3.5 - 5.1 mmol/L 3.8 4.0 4.0  Chloride 98 - 111 mmol/L 96(L) 106 106  CO2 22 - 32 mmol/L _0 Calcium 8.9 - 10.3 mg/dL 8.0(L) 8.1(L) 8.2(L)    LFT Hepatic Function Latest Ref Rng & Units 04/27/2021 04/07/2021 04/07/2021  Total Protein 6.5 - 8.1 g/dL 5.5(L) - 5.3(L)  Albumin 3.5 - 5.0 g/dL 2.1(L) 2.0(L) 1.9(L)  AST 15 - 41 U/L 27 - 24  ALT 0 - 44 U/L 12 - 13  Alk Phosphatase 38 - 126 U/L 130(H) - 116  Total Bilirubin 0.3 - 1.2 mg/dL 1.3(H) - 1.2  Bilirubin, Direct 0.00 - 0.40 mg/dL - - -     STUDIES: No results found.    Impression / Plan:   Crystal [name] is a 70 y.o. female with history of decompensated cirrhosis secondary to underlying fatty liver, esophageal varices s/p TIPS placement, hepatic encephalopathy, bilateral lower extremity DVT, acute right lower extremity DVT, on Eliquis, last dose 1 week ago who presented with worsening of bilateral lower extremity swelling as well as 2 weeks history of black tarry stool and acute on chronic anemia  Melena as well as worsening of anemia Continue octreotide and pantoprazole drips Most likely etiology bleeding from portal hypertensive gastropathy or GAVE or small bowel AVMs or combination of above Recommend ceftriaxone for SBP prophylaxis Monitor CBC closely to maintain hemoglobin above 7 and platelets above 50 Tentative plan for upper endoscopy tomorrow Okay with clear liquid diet N.p.o. effective 5a.m.  Bilateral lower extremity DVT, contributing to worsening of bilateral swelling of legs Patient has had chronic kidney disease, no evidence of  congestive heart failure Patient is undergoing IVC filter placement today Eliquis is on hold, last dose 1 week ago  Thank you for involving me in the care of this patient.      LOS: 0 days   Sherri Sear, MD  04/27/2021, 5:02 PM    Note: This dictation was prepared with Dragon dictation along with smaller phrase technology. Any transcriptional errors that result from this process are unintentional.

## 2021-04-27 NOTE — ED Provider Notes (Signed)
[facility] Emergency Department Provider Note  ____________________________________________   Event Date/Time   First MD Initiated Contact with Patient 04/27/21 1148     (approximate)  I have reviewed the triage vital signs and the nursing notes.   HISTORY  Chief Complaint Abnormal Lab and Dizziness  HPI Crystal [name] is a 70 y.o. female with the below medical history, including CHF, cirrhosis, and chronic anemia, presents to the ED for black tarry stools, and dizziness..  Patient reports she has had persistent symptoms since her discharge from this hospital 3 weeks prior.  She presented at that time with altered mental status, was found to have an acute hepatic encephalopathy, she also was found to have an acute RLE DVT, and a chronic DVT of the LLE.  She was started on Eliquis at that time, reports she completed a 3-week course, and has not taken any Eliquis in the last 2 weeks.  She is been followed by Dr. Marius Ditch and GI medicine, and had a stool sample sent last week secondary to her ongoing complaints of dark tarry stools.  She apparently had labs drawn in the outpatient setting yesterday with a reported hemoglobin of 5.5.  Patient had a RBC of 3.78, and H&H of 10/30.4 on Apr 08, 2021.  Denies any nausea, vomiting, hematemesis, or syncope.  She also denies any chest pain, shortness of breath.     Past Medical History:  Diagnosis Date   Acute GI bleeding 02/09/2018   Acute upper gastrointestinal bleeding 08/03/2018   Anemia    TAKES IRON TAB   Arthritis    SHOULDER   B12 deficiency 02/18/2018   Bleeding    GI  3/19   Bronchitis    HX OF   CHF (congestive heart failure) (HCC)    Cirrhosis (HCC)    Colon cancer screening    Diabetes mellitus without complication (HCC)    TYPE 2   Fatty (change of) liver, not elsewhere classified    Full dentures    UPPER AND LOWER   Iron deficiency anemia due to chronic blood loss 02/18/2018   Lung nodule, multiple     Pernicious anemia 02/22/2018   PUD (peptic ulcer disease)    Raynaud's syndrome    Upper GI bleeding 08/03/2018    Patient Active Problem List   Diagnosis Date Noted   GI bleeding 04/27/2021   Type II diabetes mellitus with renal manifestations ([place]) 04/27/2021   CAD (coronary artery disease) 04/27/2021   CKD (chronic kidney disease), stage IV ([place]) 04/27/2021   DVT (deep venous thrombosis) ([place]) 04/27/2021   Anemia due to blood loss 04/27/2021   DVT of lower limb, acute ([place]) 04/08/2021   Liver cirrhosis secondary to NASH ([place]) 04/08/2021   Thrombocytopenia ([place]) 04/08/2021   Chronic diastolic CHF (congestive heart failure) ([place]) 04/08/2021   Fluid retention 02/21/2021   Edema 12/09/2020   Acute on chronic diastolic heart failure ([place]) 12/09/2020   Pulmonary nodules 05/02/2020   Hepatic encephalopathy ([place]) 04/23/2020   Left ovarian cyst 10/06/2019   Hypokalemia 08/21/2019   AMS (altered mental status) 08/20/2019   Acute osteomyelitis of phalanx of hand ([place]) 06/12/2019   Pain in finger of right hand 06/04/2019   Type 2 diabetes mellitus without complication, without long-term current use of insulin ([place]) 04/02/2019   Aortic atherosclerosis ([place]) 04/02/2019   Coronary artery disease involving native coronary artery of native heart without angina pectoris 04/02/2019   Acute on chronic heart failure with preserved ejection  fraction (HFpEF) ([place]) 04/02/2019   Lung nodules    Volume overload 12/11/2018   GAVE (gastric antral vascular ectasia)    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   Type 2 diabetes mellitus with stage 3 chronic kidney disease ([place]) 08/03/2018   Hx of esophageal varices    Cirrhosis of liver without ascites ([place])    Angiodysplasia of small intestine ([place])    Adnexal cyst 03/16/2018   Goals of care, counseling/discussion 03/16/2018   Right middle lobe pulmonary nodule 03/14/2018   Right lower lobe pulmonary nodule 02/18/2018   Iron  deficiency anemia due to chronic blood loss 02/18/2018   B12 deficiency 02/18/2018   Anticentromere antibodies present 02/18/2018   Hepatitis C virus infection without hepatic coma 02/18/2018    Past Surgical History:  Procedure Laterality Date   CATARACT EXTRACTION W/PHACO Right 02/06/2018   Procedure: CATARACT EXTRACTION PHACO AND INTRAOCULAR LENS PLACEMENT ([place]) RIGHT DIABETIC;  Surgeon: Leandrew Koyanagi, MD;  Location: [place];  Service: Ophthalmology;  Laterality: Right;  DIABETIC, ORAL MED   CATARACT EXTRACTION W/PHACO Left 04/30/2018   Procedure: CATARACT EXTRACTION PHACO AND INTRAOCULAR LENS PLACEMENT (IOC);  Surgeon: Leandrew Koyanagi, MD;  Location: [facility] ORS;  Service: Ophthalmology;  Laterality: Left;  [place] 01:04 AP% 21.3 CDE 13.66 Fluid pack lot # [id number]   COLONOSCOPY WITH PROPOFOL N/A 03/25/2018   Procedure: COLONOSCOPY WITH PROPOFOL;  Surgeon: Lin Landsman, MD;  Location: [facility] ENDOSCOPY;  Service: Gastroenterology;  Laterality: N/A;   DENTAL SURGERY     EXTRACTIONS   ELECTROMAGNETIC NAVIGATION BROCHOSCOPY Right 01/27/2019   Procedure: ELECTROMAGNETIC NAVIGATION BRONCHOSCOPY;  Surgeon: [name], MD;  Location: [facility];  Service: Cardiopulmonary;  Laterality: Right;   ENTEROSCOPY N/A 07/28/2020   Procedure: Push ENTEROSCOPY;  Surgeon: Lin Landsman, MD;  Location: [facility] ENDOSCOPY;  Service: Gastroenterology;  Laterality: N/A;   ESOPHAGOGASTRODUODENOSCOPY N/A 08/03/2018   Procedure: ESOPHAGOGASTRODUODENOSCOPY (EGD);  Surgeon: Lin Landsman, MD;  Location: [facility] ENDOSCOPY;  Service: Gastroenterology;  Laterality: N/A;   ESOPHAGOGASTRODUODENOSCOPY (EGD) WITH PROPOFOL N/A 02/10/2018   Procedure: ESOPHAGOGASTRODUODENOSCOPY (EGD) WITH PROPOFOL;  Surgeon: Jonathon Bellows, MD;  Location: [facility] ENDOSCOPY;  Service: Gastroenterology;  Laterality: N/A;   ESOPHAGOGASTRODUODENOSCOPY (EGD) WITH PROPOFOL N/A 03/25/2018   Procedure: ESOPHAGOGASTRODUODENOSCOPY (EGD)  WITH PROPOFOL;  Surgeon: Lin Landsman, MD;  Location: [facility] ENDOSCOPY;  Service: Gastroenterology;  Laterality: N/A;   ESOPHAGOGASTRODUODENOSCOPY (EGD) WITH PROPOFOL N/A 04/22/2018   Procedure: ESOPHAGOGASTRODUODENOSCOPY (EGD) WITH PROPOFOL with band ligation;  Surgeon: Lin Landsman, MD;  Location: [place];  Service: Gastroenterology;  Laterality: N/A;   ESOPHAGOGASTRODUODENOSCOPY (EGD) WITH PROPOFOL N/A 06/24/2018   Procedure: ESOPHAGOGASTRODUODENOSCOPY (EGD) WITH PROPOFOL;  Surgeon: Lin Landsman, MD;  Location: [facility] ENDOSCOPY;  Service: Gastroenterology;  Laterality: N/A;   ESOPHAGOGASTRODUODENOSCOPY (EGD) WITH PROPOFOL N/A 11/19/2018   Procedure: ESOPHAGOGASTRODUODENOSCOPY (EGD) WITH PROPOFOL;  Surgeon: Lin Landsman, MD;  Location: [place];  Service: Gastroenterology;  Laterality: N/A;   ESOPHAGOGASTRODUODENOSCOPY (EGD) WITH PROPOFOL N/A 03/08/2020   Procedure: ESOPHAGOGASTRODUODENOSCOPY (EGD) WITH PROPOFOL;  Surgeon: Lin Landsman, MD;  Location: [facility] ENDOSCOPY;  Service: Gastroenterology;  Laterality: N/A;   EYE SURGERY     GIVENS CAPSULE STUDY N/A 03/29/2020   Procedure: GIVENS CAPSULE STUDY;  Surgeon: Lin Landsman, MD;  Location: [facility] ENDOSCOPY;  Service: Gastroenterology;  Laterality: N/A;   HEMORRHOID BANDING  03/25/2018   Procedure: HEMORRHOID BANDING;  Surgeon: Lin Landsman, MD;  Location: [facility] ENDOSCOPY;  Service: Gastroenterology;;   IR EMBO ART  VEN HEMORR LYMPH  EXTRAV  INC GUIDE ROADMAPPING  08/07/2018   IR RADIOLOGIST EVAL & MGMT  09/03/2018   IR RADIOLOGIST EVAL & MGMT  12/17/2018   IR RADIOLOGIST EVAL & MGMT  06/24/2019   IR RADIOLOGIST EVAL & MGMT  01/22/2020   IR RADIOLOGIST EVAL & MGMT  02/09/2021   IR TIPS  08/07/2018   IR TRANSHEPATIC PORTOGRAM W HEMO  03/07/2021   IR US GUIDE VASC ACCESS RIGHT  03/07/2021   IR VENO/JUGULAR LEFT  03/07/2021   PORTA CATH INSERTION N/A 07/30/2020   Procedure: PORTA CATH INSERTION;  Surgeon:  Katha Cabal, MD;  Location: [place] CV LAB;  Service: Cardiovascular;  Laterality: N/A;   RADIOLOGY WITH ANESTHESIA N/A 08/07/2018   Procedure: TIPS;  Surgeon: Corrie Mckusick, DO;  Location: [place];  Service: Anesthesiology;  Laterality: N/A;   TONSILLECTOMY      Prior to Admission medications   Medication Sig Start Date End Date Taking? Authorizing Provider  acetaminophen (TYLENOL) 500 MG tablet Take 500 mg by mouth daily as needed for moderate pain or headache. Patient not taking: Reported on 04/18/2021    [provider]  apixaban (ELIQUIS) 5 MG TABS tablet Take 21m po bid for 7 days then 589mpo bid 04/08/21   MeKathie DikeMD  aspirin-sod bicarb-citric acid (ALKA-SELTZER) 325 MG TBEF tablet Take 650 mg by mouth every 6 (six) hours as needed (indigestion).    [provider]  benzonatate (TESSALON) 100 MG capsule Take 100 mg by mouth 4 (four) times daily as needed. Patient not taking: Reported on 04/18/2021 03/22/21   [provider]  cyanocobalamin (,VITAMIN B-12,) 1000 MCG/ML injection Inject 1,000 mcg into the muscle every 30 (thirty) days.     [provider]  hydrOXYzine (ATARAX/VISTARIL) 25 MG tablet Take 1 tablet (25 mg total) by mouth 2 (two) times daily as needed. 04/08/21   MeKathie DikeMD  lactulose (CHRONULAC) 10 GM/15ML solution Take 30 mLs (20 g total) by mouth 3 (three) times daily. 04/08/21   MeKathie DikeMD  Misc. Devices KIT Moderate compression hose 20-30 MM HG 10/14/19   Vanga, RoTally DueMD  ondansetron (ZOFRAN) 4 MG tablet Take 4 mg by mouth every 8 (eight) hours as needed for nausea or vomiting.     [provider]  OneTouch Delica Lancets 3334VISC 2 (two) times daily. 12/01/19   [provider]  [facility] Hospital[facility] test strip 2 (two) times daily. 11/23/19   [provider]  oxyCODONE (OXY IR/ROXICODONE) 5 MG immediate release tablet Take 1 tablet (5 mg total) by mouth 2 (two) times daily as needed  for moderate pain. 04/08/21   MeKathie DikeMD  pantoprazole (PROTONIX) 40 MG tablet Take 1 tablet (40 mg total) by mouth daily. 04/13/21   VaLin LandsmanMD  POTASSIUM CHLORIDE PO Take 15 mEq by mouth daily.    [provider]  promethazine-dextromethorphan (PROMETHAZINE-DM) 6.25-15 MG/5ML syrup Take 5 mLs by mouth as needed. 03/22/21   [provider]  sodium bicarbonate 650 MG tablet Take 1,300 mg by mouth 2 (two) times daily. 02/23/21   [provider]  spironolactone (ALDACTONE) 100 MG tablet TAKE 1 TABLET BY MOUTH EVERY DAY 04/18/21   Vanga, RoTally DueMD  Tetrahydrozoline HCl (VISINE OP) Place 1 drop into both eyes daily as needed (irritaiton).    [provider]  torsemide (DEMADEX) 20 MG tablet TAKE 2 TABLETS BY MOUTH 2 TIMES DAILY. 03/08/21   WaLoel DubonnetNP  Vitamin D, Ergocalciferol, (DRISDOL) 1.25 MG (50000 UNIT) CAPS capsule Take 50,000 Units by mouth every Sunday. 01/14/21   [provider]  XIFAXAN 550 MG TABS tablet Take 550 mg by mouth 2 (two) times daily. 06/24/20   [provider]    Allergies Patient has no known allergies.  Family History  Problem Relation Age of Onset   CAD Father    Heart attack Father 63   Cancer Maternal Uncle    Seizures Mother     Social History Social History   Tobacco Use   Smoking status: Never   Smokeless tobacco: Never  Vaping Use   Vaping Use: Never used  Substance Use Topics   Alcohol use: Not Currently    Comment: No EtOH for 30 years.  Never a heavy drinker.   Drug use: Never    Review of Systems  Constitutional: No fever/chills Eyes: No visual changes. ENT: No sore throat. Cardiovascular: Denies chest pain. Respiratory: Denies shortness of breath. Gastrointestinal: No abdominal pain.  No nausea, no vomiting.  No diarrhea.  No constipation. Genitourinary: Negative for dysuria. Musculoskeletal: Negative for back pain. Skin: Negative for rash. Neurological:  Negative for headaches, focal weakness or numbness. ____________________________________________   PHYSICAL EXAM:  VITAL SIGNS: ED Triage Vitals  Enc Vitals Group     BP 04/27/21 0937 (!) 117/56     Pulse Rate 04/27/21 0937 70     Resp 04/27/21 0937 16     Temp 04/27/21 0937 98.5 F (36.9 C)     Temp Source 04/27/21 0937 Oral     SpO2 04/27/21 0937 96 %     Weight 04/27/21 0939 212 lb (96.2 kg)     Height 04/27/21 0939 5' 2" (1.575 m)     Head Circumference --      Peak Flow --      Pain Score 04/27/21 0939 0     Pain Loc --      Pain Edu? --      Excl. in [place]? --     Constitutional: Alert and oriented. Well appearing and in no acute distress. Eyes: Conjunctivae are normal. EOMI. Head: Atraumatic. Nose: No congestion/rhinnorhea. Mouth/Throat: Mucous membranes are moist.  Oropharynx non-erythematous. Neck: No stridor.   Cardiovascular: Normal rate, regular rhythm. Grossly normal heart sounds.  Good peripheral circulation. 3+ pitting edema to the BLE.  Respiratory: Normal respiratory effort.  No retractions. Lungs CTAB. Gastrointestinal: Soft and nontender. No distention. No abdominal bruits. No CVA tenderness. Musculoskeletal: No lower extremity tenderness.  No joint effusions. Neurologic:  Normal speech and language. No gross focal neurologic deficits are appreciated. No gait instability. Skin:  Skin is warm, dry and intact. No rash noted. Psychiatric: Mood and affect are normal. Speech and behavior are normal.  ____________________________________________   LABS (all labs ordered are listed, but only abnormal results are displayed)  Labs Reviewed  COMPREHENSIVE METABOLIC PANEL - Abnormal; Notable for the following components:      Result Value   Sodium 130 (*)    Chloride 96 (*)    Glucose, Bld 131 (*)    BUN 26 (*)    Creatinine, Ser 1.88 (*)    Calcium 8.0 (*)    Total Protein 5.5 (*)    Albumin 2.1 (*)    Alkaline Phosphatase 130 (*)    Total Bilirubin 1.3  (*)    GFR, Estimated 29 (*)    All other components within normal limits  CBC - Abnormal; Notable for the following  components:   WBC 3.8 (*)    RBC 2.27 (*)    Hemoglobin 5.9 (*)    HCT 19.1 (*)    RDW 18.8 (*)    Platelets 132 (*)    All other components within normal limits  BRAIN NATRIURETIC PEPTIDE - Abnormal; Notable for the following components:   B Natriuretic Peptide 314.1 (*)    All other components within normal limits  RESP PANEL BY RT-PCR (FLU A&B, COVID) ARPGX2  CBC  CBC  CBC  AMMONIA  PROTIME-INR  APTT  TYPE AND SCREEN  PREPARE RBC (CROSSMATCH)   ____________________________________________  EKG  Sinus rhythm 55-99 bpm RR 908 ms PR 176 ms QRS duration 109 ms No STEMI  ____________________________________________  RADIOLOGY I, Melvenia Needles, personally viewed and evaluated these images (plain radiographs) as part of my medical decision making, as well as reviewing the written report by the radiologist.  ED MD interpretation:    Official radiology report(s): No results found.  ____________________________________________   PROCEDURES  Procedure(s) performed (including Critical Care):  Procedures  ____________________________________________   INITIAL IMPRESSION / ASSESSMENT AND PLAN / ED COURSE  As part of my medical decision making, I reviewed the following data within the [place] reviewed critical H/H, Old chart reviewed, Discussed with admitting physician Dr. Blaine Hamper, Evaluated by EM attending P. Quentin Cornwall, and Notes from prior ED visits   DDX: esophagitis, gastric ulcer, gastritis  Patient with noted medical history, presents to the ED for evaluation of several weeks of dark tarry stools.  Patient denies any intermittent syncope, but does note generalized weakness.  She was advised that he had a critical low H&H from an outpatient lab drawn yesterday.  She has been followed by Dr. Marius Ditch and GI medicine and had  a recent hospital administration for bilateral LE DVTs.  She was on a course of Eliquis following that admission, and is not taken Eliquis in the last 2 weeks.  Patient has been heme stable in the ED with blood pressures with 025 systolic and 42H diastolic, no tachycardia, and no significant EKG changes.  She is satting 98 % on room air.  Blood transfusion orders have been placed patient will receive 2 units of packed RBCs while awaiting room assignment.  I discussed the case with Dr. Blaine Hamper, and he will admit the patient to the hospital service. ____________________________________________   FINAL CLINICAL IMPRESSION(S) / ED DIAGNOSES  Final diagnoses:  Acute upper GI bleed  Acute blood loss anemia     ED Discharge Orders     None        Note:  This document was prepared using Dragon voice recognition software and may include unintentional dictation errors.    Melvenia Needles, PA-C 04/27/21 1313    Merlyn Lot, MD 04/27/21 1505

## 2021-04-27 NOTE — Consult Note (Signed)
[facility] Vascular Consult Note  MRN : [id number]  Crystal [name] is a 70 y.o. (20-Oct-1951) female who presents with chief complaint of  Chief Complaint  Patient presents with   Abnormal Lab   Dizziness  .  History of Present Illness: patient recently diagnosed with BLE DVT and started on anticoagulation for a DVT about 3 weeks ago.  She presents with profound anemia with a hemoglobin of 5.5 and what appears to be GI bleeding.  She has felt weak and tired.  No chest pain or shortness of breath.  She is getting blood as we speak.  We are consult for placement of an IVC filter as her anticoagulation will have to be stopped.   Current Facility-Administered Medications  Medication Dose Route Frequency Provider Last Rate Last Admin   0.9 %  sodium chloride infusion  10 mL/hr Intravenous Once Ivor Costa, MD       acetaminophen (TYLENOL) tablet 650 mg  650 mg Oral Q6H PRN Ivor Costa, MD       octreotide (SANDOSTATIN) 500 mcg in sodium chloride 0.9 % 250 mL (2 mcg/mL) infusion  25-50 mcg/hr Intravenous Continuous Ivor Costa, MD 12.5 mL/hr at 04/27/21 1330 25 mcg/hr at 04/27/21 1330   ondansetron (ZOFRAN) injection 4 mg  4 mg Intravenous Q8H PRN Ivor Costa, MD       pantoprazole (PROTONIX) 80 mg in sodium chloride 0.9 % 100 mL (0.8 mg/mL) infusion  8 mg/hr Intravenous Continuous Ivor Costa, MD   Paused at 04/27/21 1404   [START ON 05/01/2021] pantoprazole (PROTONIX) injection 40 mg  40 mg Intravenous Q12H Ivor Costa, MD       Current Outpatient Medications  Medication Sig Dispense Refill   hydrOXYzine (ATARAX/VISTARIL) 25 MG tablet Take 1 tablet (25 mg total) by mouth 2 (two) times daily as needed. 30 tablet 0   lactulose (CHRONULAC) 10 GM/15ML solution Take 30 mLs (20 g total) by mouth 3 (three) times daily. 1800 mL 2   pantoprazole (PROTONIX) 40 MG tablet Take 1 tablet (40 mg total) by mouth daily. 90 tablet 3   potassium chloride (KLOR-CON) 10 MEQ tablet Take 10 mEq by  mouth daily.     POTASSIUM CHLORIDE PO Take 15 mEq by mouth daily.     sodium bicarbonate 650 MG tablet Take 1,300 mg by mouth 2 (two) times daily.     spironolactone (ALDACTONE) 100 MG tablet TAKE 1 TABLET BY MOUTH EVERY DAY 30 tablet 2   torsemide (DEMADEX) 20 MG tablet TAKE 2 TABLETS BY MOUTH 2 TIMES DAILY. 120 tablet 2   XIFAXAN 550 MG TABS tablet Take 550 mg by mouth 2 (two) times daily.     acetaminophen (TYLENOL) 500 MG tablet Take 500 mg by mouth daily as needed for moderate pain or headache. (Patient not taking: No sig reported)     apixaban (ELIQUIS) 5 MG TABS tablet Take 94m po bid for 7 days then 534mpo bid (Patient not taking: No sig reported) 60 tablet 1   aspirin-sod bicarb-citric acid (ALKA-SELTZER) 325 MG TBEF tablet Take 650 mg by mouth every 6 (six) hours as needed (indigestion).     benzonatate (TESSALON) 100 MG capsule Take 100 mg by mouth 4 (four) times daily as needed. (Patient not taking: No sig reported)     cyanocobalamin (,VITAMIN B-12,) 1000 MCG/ML injection Inject 1,000 mcg into the muscle every 30 (thirty) days.      Misc. Devices KIT Moderate compression hose 20-30 MM HG  1 kit 0   ondansetron (ZOFRAN) 4 MG tablet Take 4 mg by mouth every 8 (eight) hours as needed for nausea or vomiting.      OneTouch Delica Lancets 16W MISC 2 (two) times daily.     ONETOUCH ULTRA test strip 2 (two) times daily.     oxyCODONE (OXY IR/ROXICODONE) 5 MG immediate release tablet Take 1 tablet (5 mg total) by mouth 2 (two) times daily as needed for moderate pain. (Patient not taking: No sig reported) 10 tablet 0   promethazine-dextromethorphan (PROMETHAZINE-DM) 6.25-15 MG/5ML syrup Take 5 mLs by mouth as needed. (Patient not taking: No sig reported)     Tetrahydrozoline HCl (VISINE OP) Place 1 drop into both eyes daily as needed (irritaiton).     Vitamin D, Ergocalciferol, (DRISDOL) 1.25 MG (50000 UNIT) CAPS capsule Take 50,000 Units by mouth every Sunday.     Facility-Administered  Medications Ordered in Other Encounters  Medication Dose Route Frequency Provider Last Rate Last Admin   sodium chloride flush (NS) 0.9 % injection 10 mL  10 mL Intravenous PRN Lequita Asal, MD   10 mL at 09/14/20 1320    Past Medical History:  Diagnosis Date   Acute GI bleeding 02/09/2018   Acute upper gastrointestinal bleeding 08/03/2018   Anemia    TAKES IRON TAB   Arthritis    SHOULDER   B12 deficiency 02/18/2018   Bleeding    GI  3/19   Bronchitis    HX OF   CHF (congestive heart failure) (HCC)    Cirrhosis ([place])    Colon cancer screening    Diabetes mellitus without complication ([place])    TYPE 2   Fatty (change of) liver, not elsewhere classified    Full dentures    UPPER AND LOWER   Iron deficiency anemia due to chronic blood loss 02/18/2018   Lung nodule, multiple    Pernicious anemia 02/22/2018   PUD (peptic ulcer disease)    Raynaud's syndrome    Upper GI bleeding 08/03/2018    Past Surgical History:  Procedure Laterality Date   CATARACT EXTRACTION W/PHACO Right 02/06/2018   Procedure: CATARACT EXTRACTION PHACO AND INTRAOCULAR LENS PLACEMENT ([place]) RIGHT DIABETIC;  Surgeon: Leandrew Koyanagi, MD;  Location: [place];  Service: Ophthalmology;  Laterality: Right;  DIABETIC, ORAL MED   CATARACT EXTRACTION W/PHACO Left 04/30/2018   Procedure: CATARACT EXTRACTION PHACO AND INTRAOCULAR LENS PLACEMENT (I[facility]);  Surgeon: Leandrew Koyanagi, MD;  Location: [facility];  Service: Ophthalmology;  Laterality: Left;  [place] 01:04 AP% 21.3 CDE 13.66 Fluid pack lot # [id number]   COLONOSCOPY WITH PROPOFOL N/A 03/25/2018   Procedure: COLONOSCOPY WITH PROPOFOL;  Surgeon: Lin Landsman, MD;  Location: [facility] ENDOSCOPY;  Service: Gastroenterology;  Laterality: N/A;   DENTAL SURGERY     EXTRACTIONS   ELECTROMAGNETIC NAVIGATION BROCHOSCOPY Right 01/27/2019   Procedure: ELECTROMAGNETIC NAVIGATION BRONCHOSCOPY;  Surgeon: [name], MD;  Location: [facility];  Service:  Cardiopulmonary;  Laterality: Right;   ENTEROSCOPY N/A 07/28/2020   Procedure: Push ENTEROSCOPY;  Surgeon: Lin Landsman, MD;  Location: [facility] ENDOSCOPY;  Service: Gastroenterology;  Laterality: N/A;   ESOPHAGOGASTRODUODENOSCOPY N/A 08/03/2018   Procedure: ESOPHAGOGASTRODUODENOSCOPY (EGD);  Surgeon: Lin Landsman, MD;  Location: [facility] ENDOSCOPY;  Service: Gastroenterology;  Laterality: N/A;   ESOPHAGOGASTRODUODENOSCOPY (EGD) WITH PROPOFOL N/A 02/10/2018   Procedure: ESOPHAGOGASTRODUODENOSCOPY (EGD) WITH PROPOFOL;  Surgeon: Jonathon Bellows, MD;  Location[facility];  Service: Gastroenterology;  Laterality: N/A;   ESOPHAGOGASTRODUODENOSCOPY (EGD) WITH PROPOFOL N/A 03/25/2018  Procedure: ESOPHAGOGASTRODUODENOSCOPY (EGD) WITH PROPOFOL;  Surgeon: Lin Landsman, MD;  Location: [facility] ENDOSCOPY;  Service: Gastroenterology;  Laterality: N/A;   ESOPHAGOGASTRODUODENOSCOPY (EGD) WITH PROPOFOL N/A 04/22/2018   Procedure: ESOPHAGOGASTRODUODENOSCOPY (EGD) WITH PROPOFOL with band ligation;  Surgeon: Lin Landsman, MD;  Location: [place];  Service: Gastroenterology;  Laterality: N/A;   ESOPHAGOGASTRODUODENOSCOPY (EGD) WITH PROPOFOL N/A 06/24/2018   Procedure: ESOPHAGOGASTRODUODENOSCOPY (EGD) WITH PROPOFOL;  Surgeon: Lin Landsman, MD;  Location: [facility] ENDOSCOPY;  Service: Gastroenterology;  Laterality: N/A;   ESOPHAGOGASTRODUODENOSCOPY (EGD) WITH PROPOFOL N/A 11/19/2018   Procedure: ESOPHAGOGASTRODUODENOSCOPY (EGD) WITH PROPOFOL;  Surgeon: Lin Landsman, MD;  Location: [place];  Service: Gastroenterology;  Laterality: N/A;   ESOPHAGOGASTRODUODENOSCOPY (EGD) WITH PROPOFOL N/A 03/08/2020   Procedure: ESOPHAGOGASTRODUODENOSCOPY (EGD) WITH PROPOFOL;  Surgeon: Lin Landsman, MD;  Location: [facility] ENDOSCOPY;  Service: Gastroenterology;  Laterality: N/A;   EYE SURGERY     GIVENS CAPSULE STUDY N/A 03/29/2020   Procedure: GIVENS CAPSULE STUDY;  Surgeon: Lin Landsman, MD;   Location: [facility] ENDOSCOPY;  Service: Gastroenterology;  Laterality: N/A;   HEMORRHOID BANDING  03/25/2018   Procedure: HEMORRHOID BANDING;  Surgeon: Lin Landsman, MD;  Location: [facility] ENDOSCOPY;  Service: Gastroenterology;;   IR EMBO ART  VEN HEMORR LYMPH EXTRAV  INC GUIDE ROADMAPPING  08/07/2018   IR RADIOLOGIST EVAL & MGMT  09/03/2018   IR RADIOLOGIST EVAL & MGMT  12/17/2018   IR RADIOLOGIST EVAL & MGMT  06/24/2019   IR RADIOLOGIST EVAL & MGMT  01/22/2020   IR RADIOLOGIST EVAL & MGMT  02/09/2021   IR TIPS  08/07/2018   IR TRANSHEPATIC PORTOGRAM W HEMO  03/07/2021   IR US GUIDE VASC ACCESS RIGHT  03/07/2021   IR VENO/JUGULAR LEFT  03/07/2021   PORTA CATH INSERTION N/A 07/30/2020   Procedure: PORTA CATH INSERTION;  Surgeon: Katha Cabal, MD;  Location: [place] [facility];  Service: Cardiovascular;  Laterality: N/A;   RADIOLOGY WITH ANESTHESIA N/A 08/07/2018   Procedure: TIPS;  Surgeon: Corrie Mckusick, DO;  Location: [place];  Service: Anesthesiology;  Laterality: N/A;   TONSILLECTOMY       Social History   Tobacco Use   Smoking status: Never   Smokeless tobacco: Never  Vaping Use   Vaping Use: Never used  Substance Use Topics   Alcohol use: Not Currently    Comment: No EtOH for 30 years.  Never a heavy drinker.   Drug use: Never     Family History  Problem Relation Age of Onset   CAD Father    Heart attack Father 50   Cancer Maternal Uncle    Seizures Mother     No Known Allergies   REVIEW OF SYSTEMS (Negative unless checked)  Constitutional: _0 Weight loss  _1 Fever  _2 Chills Cardiac: _3 Chest pain   _4 Chest pressure   _5 Palpitations   _6 Shortness of breath when laying flat   _7 Shortness of breath at rest   _8 Shortness of breath with exertion. Vascular:  _9 Pain in legs with walking   _10 Pain in legs at rest   _11 Pain in legs when laying flat   _12 Claudication   _13 Pain in feet when walking  _14 Pain in feet at rest  _15 Pain in feet when laying flat   _16 History of DVT    _17 Phlebitis   _18 Swelling in legs   _19 Varicose veins   _20 Non-healing ulcers Pulmonary:   _21 Uses home oxygen   _22 Productive cough   _23 Hemoptysis   _24 Wheeze  _25 COPD   _26 Asthma Neurologic: Cirrhosis.  Fairly significant.  May be contributing to her GI bleed situation.  Dizziness  _0 Blackouts   _1 Seizures   _2 History of stroke   _3 History of TIA  _4 Aphasia   _5 Temporary blindness   _6 Dysphagia   _7 Weakness or numbness in arms   _8 Weakness or numbness in legs Musculoskeletal:  _9 Arthritis   _10 Joint swelling   _11 Joint pain   _12 Low back pain Hematologic:  _13 Easy bruising  _14 Easy bleeding   _15 Hypercoagulable state   _16 Anemic  _17 Hepatitis Gastrointestinal:  _18 Blood in stool   _19 Vomiting blood  _20 Gastroesophageal reflux/heartburn   _21 Difficulty swallowing. Genitourinary:  _22 Chronic kidney disease   _23 Difficult urination  _24 Frequent urination  _25 Burning with urination   _26 Blood in urine Skin:  _27 Rashes   _28 Ulcers   _29 Wounds Psychological:  _30 History of anxiety   _31  History of major depression.  Physical Examination  Vitals:   04/27/21 1530 04/27/21 1545 04/27/21 1600 04/27/21 1630  BP: (!) 120/56 (!) 110/54 (!) 117/52 117/73  Pulse: 65 60  60  Resp: 15 13 (!) 21 13  Temp: 98 F (36.7 C) 98.1 F (36.7 C)    TempSrc: Oral Oral    SpO2: 97% 97%  96%  Weight:      Height:       Body mass index is 38.78 kg/m. Gen:  WD/WN, NAD.  Appears older than stated age Head: [place]/AT, No temporalis wasting.  Ear/Nose/Throat: Hearing grossly intact, nares w/o erythema or drainage, oropharynx w/o Erythema/Exudate Eyes: Sclera non-icteric, conjunctiva clear Neck: Trachea midline.  No JVD.  Pulmonary:  Good air movement, respirations not labored, equal bilaterally.  Cardiac: RRR, normal S1, S2. Vascular:  Vessel Right Left  Radial Palpable Palpable                                   Gastrointestinal: soft, non-tender/non-distended. No guarding/reflex.  Musculoskeletal: M/S 5/5 throughout.  Extremities  without ischemic changes.  No deformity or atrophy.  2+ right lower extremity edema, 1+ left lower extremity edema. Neurologic: Sensation grossly intact in extremities.  Symmetrical.  Speech is fluent. Motor exam as listed above. Psychiatric: Judgment intact, Mood & affect appropriate for pt's clinical situation. Dermatologic: No rashes or ulcers noted.  No cellulitis or open wounds. Lymph : No Cervical, Axillary, or Inguinal lymphadenopathy.     CBC Lab Results  Component Value Date   WBC 3.2 (L) 04/27/2021   HGB 5.3 (L) 04/27/2021   HCT 16.5 (L) 04/27/2021   MCV 82.1 04/27/2021   PLT 106 (L) 04/27/2021    BMET    Component Value Date/Time   NA 130 (L) 04/27/2021 0945   NA 135 02/01/2021 0935   K 3.8 04/27/2021 0945   CL 96 (L) 04/27/2021 0945   CO2 23 04/27/2021 0945   GLUCOSE 131 (H) 04/27/2021 0945   BUN 26 (H) 04/27/2021 0945   BUN 25 02/01/2021 0935   CREATININE 1.88 (H) 04/27/2021 0945   CREATININE 2.20 (H) 02/09/2021 0000   CALCIUM 8.0 (L) 04/27/2021 0945   GFRNONAA 29 (L) 04/27/2021 0945   GFRNONAA 22 (L) 02/09/2021 0000   GFRAA 26 (L) 02/09/2021 0000   Estimated Creatinine Clearance: 30.5 mL/min (A) (by C-G formula based on SCr of 1.88 mg/dL (H)).  COAG Lab Results  Component Value Date   INR 1.3 (H) 04/27/2021   INR 1.3 (H) 03/07/2021   INR 1.1 06/16/2019    Radiology CT Head Wo Contrast  Result Date: 04/04/2021  CLINICAL DATA:  Altered mental status. EXAM: CT HEAD WITHOUT CONTRAST TECHNIQUE: Contiguous axial images were obtained from the base of the skull through the vertex without intravenous contrast. COMPARISON:  August 20, 2019 FINDINGS: Brain: There is mild cerebral atrophy with widening of the extra-axial spaces and ventricular dilatation. There are areas of decreased attenuation within the white matter tracts of the supratentorial brain, consistent with microvascular disease changes. Vascular: No hyperdense vessel or unexpected calcification. Skull:  Normal. Negative for fracture or focal lesion. Sinuses/Orbits: No acute finding. Other: The study is limited secondary to patient motion. IMPRESSION: Limited study, as described above, without and acute intracranial abnormality. Electronically Signed   By: Virgina Norfolk M.D.   On: 04/04/2021 01:10   US Venous Img Lower Bilateral (DVT)  Result Date: 04/07/2021 CLINICAL DATA:  Initial evaluation for bilateral foot pain. EXAM: BILATERAL LOWER EXTREMITY VENOUS DOPPLER ULTRASOUND TECHNIQUE: Gray-scale sonography with graded compression, as well as color Doppler and duplex ultrasound were performed to evaluate the lower extremity deep venous systems from the level of the common femoral vein and including the common femoral, femoral, profunda femoral, popliteal and calf veins including the posterior tibial, peroneal and gastrocnemius veins when visible. The superficial great saphenous vein was also interrogated. Spectral Doppler was utilized to evaluate flow at rest and with distal augmentation maneuvers in the common femoral, femoral and popliteal veins. COMPARISON:  Prior study from 03/30/2020 FINDINGS: RIGHT LOWER EXTREMITY Common Femoral Vein: No evidence of thrombus. Normal compressibility, respiratory phasicity and response to augmentation. Saphenofemoral Junction: No evidence of thrombus. Normal compressibility and flow on color Doppler imaging. Profunda Femoral Vein: No evidence of thrombus. Normal compressibility and flow on color Doppler imaging. Femoral Vein: No evidence of thrombus. Normal compressibility, respiratory phasicity and response to augmentation. Popliteal Vein: Hypoechoic material distending the right popliteal vein suspicious for thrombus. This is nonocclusive. Loss of normal compressibility. Calf Veins: Nonocclusive thrombus extends into the right posterior tibial and peroneal veins. Superficial Great Saphenous Vein: No evidence of thrombus. Normal compressibility. Venous Reflux:  None.  Other Findings:  None. LEFT LOWER EXTREMITY Common Femoral Vein: Again seen is heterogeneous echogenic material positioned along the wall of the distal left common femoral vein, likely reflecting chronic thrombus. Luminal narrowing with decreased flow seen at this level. This is relatively similar in appearance from previous. The remainder of the visualized left common femoral vein appears widely patent. Saphenofemoral Junction: No evidence of thrombus. Normal compressibility and flow on color Doppler imaging. Profunda Femoral Vein: No evidence of thrombus. Normal compressibility and flow on color Doppler imaging. Femoral Vein: No evidence of thrombus. Normal compressibility, respiratory phasicity and response to augmentation. Popliteal Vein: No evidence of thrombus. Normal compressibility, respiratory phasicity and response to augmentation. Calf Veins: No evidence of thrombus. Normal compressibility and flow on color Doppler imaging. Superficial Great Saphenous Vein: No evidence of thrombus. Normal compressibility. Venous Reflux:  None. Other Findings:  None. IMPRESSION: 1. Acute nonocclusive DVT involving the right popliteal vein extending into the veins of the right calf. 2. 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 No other evidence for acute DVT within the left lower extremity. Electronically Signed   By: Jeannine Boga M.D.   On: 04/07/2021 03:34      Assessment/Plan 1.  DVT with anemia and GI bleed requiring cessation of anticoagulation.  IVC filter is appropriate.  This will be placed tomorrow.  Risks and benefits were discussed with  the patient. 2.  Anemia with GI bleed.  GI is seeing.  They significant bleed source is localized I would be happy to consider embolization if appropriate for the patient. 3.  Cirrhosis.  Fairly significant.  May be contributing to her GI bleed situation. 4.  Diabetes.  blood glucose control important in reducing the progression of atherosclerotic disease. Also, involved in wound healing. On appropriate medications.    Leotis Pain, MD  04/27/2021 4:47 PM    This note was created with Dragon medical transcription system.  Any error is purely unintentional

## 2021-04-27 NOTE — H&P (Signed)
History and Physical    Crystal [name]:[id number] DOB: Jun 02, 1951 DOA: 04/27/2021  Referring MD/NP/PA:   PCP: Pcp, No   Patient coming from:  The patient is coming from home.  At baseline, pt is independent for most of ADL.        Chief Complaint: dark stool  HPI: Crystal [name] is a 70 y.o. female with medical history significant of liver cirrhosis, s/p of TIPS, hepatitis C, DM, GIB, PUD, angiodysplasia of the small intestine, GAVE (gastric antral vascular ectasis), anemia, dCHF, CAD, CKD-IV, DVT (stopped Eliquis 1 week ago per pt), thrombocytopenia, who presents with dark stool.  Patient was recently hospitalized from 5/23-5/27 due to hepatic encephalopathy.  Patient was treated with lactulose. In that admission, patient was found to have acute RLE DVT, and chronic DVT of the LLE. Pt was discharged on Eliquis.  Patient states that she has intermittent dark stool for about 3 weeks. She is being followed by Dr. Marius Ditch. She had labs drawn in the outpatient setting yesterday with reported hemoglobin of 5.5. Her Hg was 10.0 on 04/08/21.  Patient has nausea, no vomiting or diarrhea.  Patient has mild pain in both sides of upper abdomen. She has mild dry cough, and mild shortness breath, no chest pain, fever or chills.  Denies symptoms of UTI.  Patient states that she stopped taking Eliquis for more than 1 week.   ED Course: pt was found to have Hgb 5.9 (10.0 on 04/08/21), WBC 3.8, pending COVID-19 PCR, renal function close to baseline, temperature normal, blood pressure 123/56, heart rate 70, RR 16, oxygen saturation 96% on 2 L oxygen.  Patient is admitted to progressive bed as inpatient.  Dr. Marius Ditch for GI and Dr. Lucky Cowboy of [facility] are consulted.  Review of Systems:   General: no fevers, chills, no body weight gain,  has fatigue HEENT: no blurry vision, hearing changes or sore throat Respiratory: has mild dyspnea, mild coughing, no wheezing CV: no chest pain, no palpitations GI: has nausea, abdominal  pain and dark stool, no diarrhea, constipationvomiting,  GU: no dysuria, burning on urination, increased urinary frequency, hematuria  Ext: has leg edema Neuro: no unilateral weakness, numbness, or tingling, no vision change or hearing loss Skin: no rash, no skin tear. MSK: No muscle spasm, no deformity, no limitation of range of movement in spin Heme: No easy bruising.  Travel history: No recent long distant travel.  Allergy: No Known Allergies  Past Medical History:  Diagnosis Date   Acute GI bleeding 02/09/2018   Acute upper gastrointestinal bleeding 08/03/2018   Anemia    TAKES IRON TAB   Arthritis    SHOULDER   B12 deficiency 02/18/2018   Bleeding    GI  3/19   Bronchitis    HX OF   CHF (congestive heart failure) (HCC)    Cirrhosis (HCC)    Colon cancer screening    Diabetes mellitus without complication (HCC)    TYPE 2   Fatty (change of) liver, not elsewhere classified    Full dentures    UPPER AND LOWER   Iron deficiency anemia due to chronic blood loss 02/18/2018   Lung nodule, multiple    Pernicious anemia 02/22/2018   PUD (peptic ulcer disease)    Raynaud's syndrome    Upper GI bleeding 08/03/2018    Past Surgical History:  Procedure Laterality Date   CATARACT EXTRACTION W/PHACO Right 02/06/2018   Procedure: CATARACT EXTRACTION PHACO AND INTRAOCULAR LENS PLACEMENT (IOC) RIGHT DIABETIC;  Surgeon: Leandrew Koyanagi, MD;  Location: [place];  Service: Ophthalmology;  Laterality: Right;  DIABETIC, ORAL MED   CATARACT EXTRACTION W/PHACO Left 04/30/2018   Procedure: CATARACT EXTRACTION PHACO AND INTRAOCULAR LENS PLACEMENT (IOC);  Surgeon: Leandrew Koyanagi, MD;  Location: [facility];  Service: Ophthalmology;  Laterality: Left;  [place] 01:04 AP% 21.3 CDE 13.66 Fluid pack lot # [id number]   COLONOSCOPY WITH PROPOFOL N/A 03/25/2018   Procedure: COLONOSCOPY WITH PROPOFOL;  Surgeon: Lin Landsman, MD;  Location: [facility] ENDOSCOPY;  Service: Gastroenterology;   Laterality: N/A;   DENTAL SURGERY     EXTRACTIONS   ELECTROMAGNETIC NAVIGATION BROCHOSCOPY Right 01/27/2019   Procedure: ELECTROMAGNETIC NAVIGATION BRONCHOSCOPY;  Surgeon: [name], MD;  Location: [facility];  Service: Cardiopulmonary;  Laterality: Right;   ENTEROSCOPY N/A 07/28/2020   Procedure: Push ENTEROSCOPY;  Surgeon: Lin Landsman, MD;  Location: [facility] ENDOSCOPY;  Service: Gastroenterology;  Laterality: N/A;   ESOPHAGOGASTRODUODENOSCOPY N/A 08/03/2018   Procedure: ESOPHAGOGASTRODUODENOSCOPY (EGD);  Surgeon: Lin Landsman, MD;  Location: [facility] ENDOSCOPY;  Service: Gastroenterology;  Laterality: N/A;   ESOPHAGOGASTRODUODENOSCOPY (EGD) WITH PROPOFOL N/A 02/10/2018   Procedure: ESOPHAGOGASTRODUODENOSCOPY (EGD) WITH PROPOFOL;  Surgeon: Jonathon Bellows, MD;  Location: [facility] ENDOSCOPY;  Service: Gastroenterology;  Laterality: N/A;   ESOPHAGOGASTRODUODENOSCOPY (EGD) WITH PROPOFOL N/A 03/25/2018   Procedure: ESOPHAGOGASTRODUODENOSCOPY (EGD) WITH PROPOFOL;  Surgeon: Lin Landsman, MD;  Location: [facility] ENDOSCOPY;  Service: Gastroenterology;  Laterality: N/A;   ESOPHAGOGASTRODUODENOSCOPY (EGD) WITH PROPOFOL N/A 04/22/2018   Procedure: ESOPHAGOGASTRODUODENOSCOPY (EGD) WITH PROPOFOL with band ligation;  Surgeon: Lin Landsman, MD;  Location: [place];  Service: Gastroenterology;  Laterality: N/A;   ESOPHAGOGASTRODUODENOSCOPY (EGD) WITH PROPOFOL N/A 06/24/2018   Procedure: ESOPHAGOGASTRODUODENOSCOPY (EGD) WITH PROPOFOL;  Surgeon: Lin Landsman, MD;  Location: [facility] ENDOSCOPY;  Service: Gastroenterology;  Laterality: N/A;   ESOPHAGOGASTRODUODENOSCOPY (EGD) WITH PROPOFOL N/A 11/19/2018   Procedure: ESOPHAGOGASTRODUODENOSCOPY (EGD) WITH PROPOFOL;  Surgeon: Lin Landsman, MD;  Location: [place];  Service: Gastroenterology;  Laterality: N/A;   ESOPHAGOGASTRODUODENOSCOPY (EGD) WITH PROPOFOL N/A 03/08/2020   Procedure: ESOPHAGOGASTRODUODENOSCOPY (EGD) WITH PROPOFOL;  Surgeon:  Lin Landsman, MD;  Location: [name] ENDOSCOPY;  Service: Gastroenterology;  Laterality: N/A;   EYE SURGERY     GIVENS CAPSULE STUDY N/A 03/29/2020   Procedure: GIVENS CAPSULE STUDY;  Surgeon: Lin Landsman, MD;  Location: [facility] ENDOSCOPY;  Service: Gastroenterology;  Laterality: N/A;   HEMORRHOID BANDING  03/25/2018   Procedure: HEMORRHOID BANDING;  Surgeon: Lin Landsman, MD;  Location: [facility] ENDOSCOPY;  Service: Gastroenterology;;   IR EMBO ART  VEN HEMORR LYMPH EXTRAV  INC GUIDE ROADMAPPING  08/07/2018   IR RADIOLOGIST EVAL & MGMT  09/03/2018   IR RADIOLOGIST EVAL & MGMT  12/17/2018   IR RADIOLOGIST EVAL & MGMT  06/24/2019   IR RADIOLOGIST EVAL & MGMT  01/22/2020   IR RADIOLOGIST EVAL & MGMT  02/09/2021   IR TIPS  08/07/2018   IR TRANSHEPATIC PORTOGRAM W HEMO  03/07/2021   IR US GUIDE VASC ACCESS RIGHT  03/07/2021   IR VENO/JUGULAR LEFT  03/07/2021   PORTA CATH INSERTION N/A 07/30/2020   Procedure: PORTA CATH INSERTION;  Surgeon: Katha Cabal, MD;  Location: [place] CV LAB;  Service: Cardiovascular;  Laterality: N/A;   RADIOLOGY WITH ANESTHESIA N/A 08/07/2018   Procedure: TIPS;  Surgeon: Corrie Mckusick, DO;  Location: [place];  Service: Anesthesiology;  Laterality: N/A;   TONSILLECTOMY      Social History:  reports that she has never smoked. She has  never used smokeless tobacco. She reports previous alcohol use. She reports that she does not use drugs.  Family History:  Family History  Problem Relation Age of Onset   CAD Father    Heart attack Father 28   Cancer Maternal Uncle    Seizures Mother      Prior to Admission medications   Medication Sig Start Date End Date Taking? Authorizing Provider  acetaminophen (TYLENOL) 500 MG tablet Take 500 mg by mouth daily as needed for moderate pain or headache. Patient not taking: Reported on 04/18/2021    [provider]  apixaban (ELIQUIS) 5 MG TABS tablet Take 3m po bid for 7 days then 535mpo bid 04/08/21   MeKathie DikeMD  aspirin-sod bicarb-citric acid (ALKA-SELTZER) 325 MG TBEF tablet Take 650 mg by mouth every 6 (six) hours as needed (indigestion).    [provider]  benzonatate (TESSALON) 100 MG capsule Take 100 mg by mouth 4 (four) times daily as needed. Patient not taking: Reported on 04/18/2021 03/22/21   [provider]  cyanocobalamin (,VITAMIN B-12,) 1000 MCG/ML injection Inject 1,000 mcg into the muscle every 30 (thirty) days.     [provider]  hydrOXYzine (ATARAX/VISTARIL) 25 MG tablet Take 1 tablet (25 mg total) by mouth 2 (two) times daily as needed. 04/08/21   MeKathie DikeMD  lactulose (CHRONULAC) 10 GM/15ML solution Take 30 mLs (20 g total) by mouth 3 (three) times daily. 04/08/21   MeKathie DikeMD  Misc. Devices KIT Moderate compression hose 20-30 MM HG 10/14/19   Vanga, RoTally DueMD  ondansetron (ZOFRAN) 4 MG tablet Take 4 mg by mouth every 8 (eight) hours as needed for nausea or vomiting.     [provider]  OneTouch Delica Lancets 3351OISC 2 (two) times daily. 12/01/19   [provider]  [facility] Hospital[facility] test strip 2 (two) times daily. 11/23/19   [provider]  oxyCODONE (OXY IR/ROXICODONE) 5 MG immediate release tablet Take 1 tablet (5 mg total) by mouth 2 (two) times daily as needed for moderate pain. 04/08/21   MeKathie DikeMD  pantoprazole (PROTONIX) 40 MG tablet Take 1 tablet (40 mg total) by mouth daily. 04/13/21   VaLin LandsmanMD  POTASSIUM CHLORIDE PO Take 15 mEq by mouth daily.    [provider]  promethazine-dextromethorphan (PROMETHAZINE-DM) 6.25-15 MG/5ML syrup Take 5 mLs by mouth as needed. 03/22/21   [provider]  sodium bicarbonate 650 MG tablet Take 1,300 mg by mouth 2 (two) times daily. 02/23/21   [provider]  spironolactone (ALDACTONE) 100 MG tablet TAKE 1 TABLET BY MOUTH EVERY DAY 04/18/21   Vanga, RoTally DueMD  Tetrahydrozoline HCl (VISINE OP) Place 1 drop  into both eyes daily as needed (irritaiton).    [provider]  torsemide (DEMADEX) 20 MG tablet TAKE 2 TABLETS BY MOUTH 2 TIMES DAILY. 03/08/21   WaLoel DubonnetNP  Vitamin D, Ergocalciferol, (DRISDOL) 1.25 MG (50000 UNIT) CAPS capsule Take 50,000 Units by mouth every Sunday. 01/14/21   [provider]  XIFAXAN 550 MG TABS tablet Take 550 mg by mouth 2 (two) times daily. 06/24/20   [provider]    Physical Exam: Vitals:   04/27/21 0939 04/27/21 1210 04/27/21 1405 04/27/21 1420  BP:  (!) 123/56 113/61 102/63  Pulse:  69 62 63  Resp:  _0 Temp:   97.9 F (36.6 C)   TempSrc:   Oral  SpO2:  98% 98% 98%  Weight: 96.2 kg     Height: 5' 2" (1.575 m)      General: Not in acute distress. Pale looking HEENT:       Eyes: PERRL, EOMI, no scleral icterus.       ENT: No discharge from the ears and nose, no pharynx injection, no tonsillar enlargement.        Neck: No JVD, no bruit, no mass felt. Heme: No neck lymph node enlargement. Cardiac: S1/S2, RRR, No murmurs, No gallops or rubs. Respiratory: No rales, wheezing, rhonchi or rubs. GI: Soft, nondistended, has mild tenderness in both sides of upper abdomen, no rebound pain, no organomegaly, BS present. GU: No hematuria Ext: has 2+ pitting leg edema bilaterally. 1+DP/PT pulse bilaterally. Musculoskeletal: No joint deformities, No joint redness or warmth, no limitation of ROM in spin. Skin: No rashes.  Neuro: Alert, oriented X3, cranial nerves II-XII grossly intact, moves all extremities normally.  Labs on Admission: I have personally reviewed following labs and imaging studies  CBC: Recent Labs  Lab 04/26/21 1139 04/27/21 0945  WBC 4.3 3.8*  HGB 5.5* 5.9*  HCT 17.6* 19.1*  MCV 83 84.1  PLT 126* 478*   Basic Metabolic Panel: Recent Labs  Lab 04/27/21 0945  NA 130*  K 3.8  CL 96*  CO2 23  GLUCOSE 131*  BUN 26*  CREATININE 1.88*  CALCIUM 8.0*   GFR: Estimated Creatinine Clearance: 30.5  mL/min (A) (by C-G formula based on SCr of 1.88 mg/dL (H)). Liver Function Tests: Recent Labs  Lab 04/27/21 0945  AST 27  ALT 12  ALKPHOS 130*  BILITOT 1.3*  PROT 5.5*  ALBUMIN 2.1*   No results for input(s): LIPASE, AMYLASE in the last 168 hours. Recent Labs  Lab 04/27/21 1400  AMMONIA 19   Coagulation Profile: Recent Labs  Lab 04/27/21 1400  INR 1.3*   Cardiac Enzymes: No results for input(s): CKTOTAL, CKMB, CKMBINDEX, TROPONINI in the last 168 hours. BNP (last 3 results) No results for input(s): PROBNP in the last 8760 hours. HbA1C: No results for input(s): HGBA1C in the last 72 hours. CBG: No results for input(s): GLUCAP in the last 168 hours. Lipid Profile: No results for input(s): CHOL, HDL, LDLCALC, TRIG, CHOLHDL, LDLDIRECT in the last 72 hours. Thyroid Function Tests: No results for input(s): TSH, T4TOTAL, FREET4, T3FREE, THYROIDAB in the last 72 hours. Anemia Panel: No results for input(s): VITAMINB12, FOLATE, FERRITIN, TIBC, IRON, RETICCTPCT in the last 72 hours. Urine analysis:    Component Value Date/Time   COLORURINE STRAW (A) 04/04/2021 0127   APPEARANCEUR CLEAR (A) 04/04/2021 0127   LABSPEC 1.006 04/04/2021 0127   PHURINE 9.0 (H) 04/04/2021 0127   GLUCOSEU NEGATIVE 04/04/2021 0127   HGBUR NEGATIVE 04/04/2021 0127   BILIRUBINUR NEGATIVE 04/04/2021 0127   KETONESUR NEGATIVE 04/04/2021 0127   PROTEINUR NEGATIVE 04/04/2021 0127   NITRITE NEGATIVE 04/04/2021 0127   LEUKOCYTESUR LARGE (A) 04/04/2021 0127   Sepsis Labs: _0 (procalcitonin:4,lacticidven:4) ) Recent Results (from the past 240 hour(s))  Resp Panel by RT-PCR (Flu A&B, Covid) Nasopharyngeal Swab     Status: None   Collection Time: 04/27/21 12:35 PM   Specimen: Nasopharyngeal Swab; Nasopharyngeal(NP) swabs in vial transport medium  Result Value Ref Range Status   SARS Coronavirus 2 by RT PCR NEGATIVE NEGATIVE Final    Comment: (NOTE) SARS-CoV-2 target nucleic acids are NOT  DETECTED.  The SARS-CoV-2 RNA is generally detectable in upper respiratory specimens during the acute phase of infection.  The lowest concentration of SARS-CoV-2 viral copies this assay can detect is 138 copies/mL. A negative result does not preclude SARS-Cov-2 infection and should not be used as the sole basis for treatment or other patient management decisions. A negative result may occur with  improper specimen collection/handling, submission of specimen other than nasopharyngeal swab, presence of viral mutation(s) within the areas targeted by this assay, and inadequate number of viral copies(<138 copies/mL). A negative result must be combined with clinical observations, patient history, and epidemiological information. The expected result is Negative.  Fact Sheet for Patients:  EntrepreneurPulse.com.au  Fact Sheet for Healthcare Providers:  IncredibleEmployment.be  This test is no t yet approved or cleared by the [place] FDA and  has been authorized for detection and/or diagnosis of SARS-CoV-2 by FDA under an Emergency Use Authorization (EUA). This EUA will remain  in effect (meaning this test can be used) for the duration of the COVID-19 declaration under Section 564(b)(1) of the Act, 21 U.S.C.section 360bbb-3(b)(1), unless the authorization is terminated  or revoked sooner.       Influenza A by PCR NEGATIVE NEGATIVE Final   Influenza B by PCR NEGATIVE NEGATIVE Final    Comment: (NOTE) The Xpert Xpress SARS-CoV-2/FLU/RSV plus assay is intended as an aid in the diagnosis of influenza from Nasopharyngeal swab specimens and should not be used as a sole basis for treatment. Nasal washings and aspirates are unacceptable for Xpert Xpress SARS-CoV-2/FLU/RSV testing.  Fact Sheet for Patients: EntrepreneurPulse.com.au  Fact Sheet for Healthcare Providers: IncredibleEmployment.be  This test is not yet  approved or cleared by the [place] FDA and has been authorized for detection and/or diagnosis of SARS-CoV-2 by FDA under an Emergency Use Authorization (EUA). This EUA will remain in effect (meaning this test can be used) for the duration of the COVID-19 declaration under Section 564(b)(1) of the Act, 21 U.S.C. section 360bbb-3(b)(1), unless the authorization is terminated or revoked.  Performed at [facility], [address] [place], [place] [address]      Radiological Exams on Admission: No results found.   EKG: I have personally reviewed.  Sinus rhythm, QTC 487, low voltage, nonspecific to change  Assessment/Plan Principal Problem:   GI bleeding Active Problems:   Cirrhosis of liver without ascites (HCC)   S/P TIPS (transjugular intrahepatic portosystemic shunt)   Liver cirrhosis secondary to NASH (HCC)   Thrombocytopenia (HCC)   Chronic diastolic CHF (congestive heart failure) (HCC)   Type II diabetes mellitus with renal manifestations (HCC)   CAD (coronary artery disease)   CKD (chronic kidney disease), stage IV (HCC)   DVT (deep venous thrombosis) (HCC)   Anemia due to blood loss   GI bleeding and anemia due to blood loss: Hgb 10.0 -->5.9. Dr. Marius Ditch of GI is consulted.  - will admitted to progressive bed as inpatient - transfuse 2 units of blood now - NPO now - Start IV pantoprazole gtt - start Octreotide gtt - Zofran IV for nausea - Avoid NSAIDs and SQ heparin - Maintain IV access (2 large bore IVs if possible). - Monitor closely and follow q6h cbc, transfuse as necessary, if Hgb<7.0 - LaB: INR, PTT and type screen -hold aspirin-SOD bicarb-citric acid  Cirrhosis of liver without ascites and liver cirrhosis secondary to NASH: S/P TIPS (transjugular intrahepatic portosystemic shunt).  Mental status normal. Ammonia 45 -continue lactulose 20 g 3 times daily -Continue Xifaxan  Thrombocytopenia ([place]): Platelet 132.  Most likely due to liver  cirrhosis -f/u with CBC  Chronic  diastolic CHF (congestive heart failure) ([place]): 2D echo on 12/14/2020 showed EF of 55-60% with grade 1 diastolic dysfunction.  Patient has  2+ bilateral leg edema, which is likely due to DVT partially . No worsening shortness of breath.  Does not seem to have CHF acute exacerbation -Will hold torsemide and spironolactone today while patient is on p.o. -Will restart torsemide and spironolactone tomorrow morning  Type II diabetes mellitus with renal manifestations (HCC) -check CBG qAM  CAD (coronary artery disease): No CP -watch closely  CKD (chronic kidney disease), stage IV ([place]): stable.  Recent baseline creatinine 1.5-2.0.  Her creatinine is 1.88, BUN 26 today. -f/u with BMP -continue sodium bicarbonate  DVT (deep venous thrombosis) ([place]): Patient did not take his Eliquis for more than 1 week. -consulted Dr. Lucky Cowboy for possible IVC filer placement       DVT ppx: none (cannot use heparin/Lovenox due to GI bleeding, cannot use SCD due to DVT) Code Status: Full code Family Communication: I have tried to call her daughter without success, I left a message to her daughter. Disposition Plan:  Anticipate discharge back to previous environment Consults called:  Dr. Lucky Cowboy of [facility] and Dr. Marius Ditch of GI Admission status and Level of care: Progressive Cardiac:   as inpt       Status is: Inpatient  Remains inpatient appropriate because:Inpatient level of care appropriate due to severity of illness  Dispo: The patient is from: Home              Anticipated d/c is to: Home              Patient currently is not medically stable to d/c.   Difficult to place patient No           Date of Service 04/27/2021    [facility]   If 7PM-7AM, please contact night-coverage [email or web address] 04/27/2021, 2:35 PM

## 2021-04-27 NOTE — ED Notes (Addendum)
Pt approved to go to med surg floor per Dr Sidney Ace.

## 2021-04-27 NOTE — ED Triage Notes (Signed)
Pt states she has been having black stools since being discharged from the hospital 3 weeks ago, states she had labs drawn yesterday and her Hbg 5.5.Marland Kitchen pt c/o having dizziness this morning. Pt is a/ox4 on arrival.

## 2021-04-27 NOTE — H&P (View-Only) (Signed)
[facility] Vascular Consult Note  MRN : [id number]  Crystal [name] is a 70 y.o. (December 10, 1950) female who presents with chief complaint of  Chief Complaint  Patient presents with   Abnormal Lab   Dizziness  .  History of Present Illness: patient recently diagnosed with BLE DVT and started on anticoagulation for a DVT about 3 weeks ago.  She presents with profound anemia with a hemoglobin of 5.5 and what appears to be GI bleeding.  She has felt weak and tired.  No chest pain or shortness of breath.  She is getting blood as we speak.  We are consult for placement of an IVC filter as her anticoagulation will have to be stopped.   Current Facility-Administered Medications  Medication Dose Route Frequency Provider Last Rate Last Admin   0.9 %  sodium chloride infusion  10 mL/hr Intravenous Once Ivor Costa, MD       acetaminophen (TYLENOL) tablet 650 mg  650 mg Oral Q6H PRN Ivor Costa, MD       octreotide (SANDOSTATIN) 500 mcg in sodium chloride 0.9 % 250 mL (2 mcg/mL) infusion  25-50 mcg/hr Intravenous Continuous Ivor Costa, MD 12.5 mL/hr at 04/27/21 1330 25 mcg/hr at 04/27/21 1330   ondansetron (ZOFRAN) injection 4 mg  4 mg Intravenous Q8H PRN Ivor Costa, MD       pantoprazole (PROTONIX) 80 mg in sodium chloride 0.9 % 100 mL (0.8 mg/mL) infusion  8 mg/hr Intravenous Continuous Ivor Costa, MD   Paused at 04/27/21 1404   [START ON 05/01/2021] pantoprazole (PROTONIX) injection 40 mg  40 mg Intravenous Q12H Ivor Costa, MD       Current Outpatient Medications  Medication Sig Dispense Refill   hydrOXYzine (ATARAX/VISTARIL) 25 MG tablet Take 1 tablet (25 mg total) by mouth 2 (two) times daily as needed. 30 tablet 0   lactulose (CHRONULAC) 10 GM/15ML solution Take 30 mLs (20 g total) by mouth 3 (three) times daily. 1800 mL 2   pantoprazole (PROTONIX) 40 MG tablet Take 1 tablet (40 mg total) by mouth daily. 90 tablet 3   potassium chloride (KLOR-CON) 10 MEQ tablet Take 10 mEq by  mouth daily.     POTASSIUM CHLORIDE PO Take 15 mEq by mouth daily.     sodium bicarbonate 650 MG tablet Take 1,300 mg by mouth 2 (two) times daily.     spironolactone (ALDACTONE) 100 MG tablet TAKE 1 TABLET BY MOUTH EVERY DAY 30 tablet 2   torsemide (DEMADEX) 20 MG tablet TAKE 2 TABLETS BY MOUTH 2 TIMES DAILY. 120 tablet 2   XIFAXAN 550 MG TABS tablet Take 550 mg by mouth 2 (two) times daily.     acetaminophen (TYLENOL) 500 MG tablet Take 500 mg by mouth daily as needed for moderate pain or headache. (Patient not taking: No sig reported)     apixaban (ELIQUIS) 5 MG TABS tablet Take 69m po bid for 7 days then 591mpo bid (Patient not taking: No sig reported) 60 tablet 1   aspirin-sod bicarb-citric acid (ALKA-SELTZER) 325 MG TBEF tablet Take 650 mg by mouth every 6 (six) hours as needed (indigestion).     benzonatate (TESSALON) 100 MG capsule Take 100 mg by mouth 4 (four) times daily as needed. (Patient not taking: No sig reported)     cyanocobalamin (,VITAMIN B-12,) 1000 MCG/ML injection Inject 1,000 mcg into the muscle every 30 (thirty) days.      Misc. Devices KIT Moderate compression hose 20-30 MM HG  1 kit 0   ondansetron (ZOFRAN) 4 MG tablet Take 4 mg by mouth every 8 (eight) hours as needed for nausea or vomiting.      OneTouch Delica Lancets 94B MISC 2 (two) times daily.     ONETOUCH ULTRA test strip 2 (two) times daily.     oxyCODONE (OXY IR/ROXICODONE) 5 MG immediate release tablet Take 1 tablet (5 mg total) by mouth 2 (two) times daily as needed for moderate pain. (Patient not taking: No sig reported) 10 tablet 0   promethazine-dextromethorphan (PROMETHAZINE-DM) 6.25-15 MG/5ML syrup Take 5 mLs by mouth as needed. (Patient not taking: No sig reported)     Tetrahydrozoline HCl (VISINE OP) Place 1 drop into both eyes daily as needed (irritaiton).     Vitamin D, Ergocalciferol, (DRISDOL) 1.25 MG (50000 UNIT) CAPS capsule Take 50,000 Units by mouth every Sunday.     Facility-Administered  Medications Ordered in Other Encounters  Medication Dose Route Frequency Provider Last Rate Last Admin   sodium chloride flush (NS) 0.9 % injection 10 mL  10 mL Intravenous PRN Lequita Asal, MD   10 mL at 09/14/20 1320    Past Medical History:  Diagnosis Date   Acute GI bleeding 02/09/2018   Acute upper gastrointestinal bleeding 08/03/2018   Anemia    TAKES IRON TAB   Arthritis    SHOULDER   B12 deficiency 02/18/2018   Bleeding    GI  3/19   Bronchitis    HX OF   CHF (congestive heart failure) (HCC)    Cirrhosis ([place])    Colon cancer screening    Diabetes mellitus without complication ([place])    TYPE 2   Fatty (change of) liver, not elsewhere classified    Full dentures    UPPER AND LOWER   Iron deficiency anemia due to chronic blood loss 02/18/2018   Lung nodule, multiple    Pernicious anemia 02/22/2018   PUD (peptic ulcer disease)    Raynaud's syndrome    Upper GI bleeding 08/03/2018    Past Surgical History:  Procedure Laterality Date   CATARACT EXTRACTION W/PHACO Right 02/06/2018   Procedure: CATARACT EXTRACTION PHACO AND INTRAOCULAR LENS PLACEMENT ([place]) RIGHT DIABETIC;  Surgeon: Leandrew Koyanagi, MD;  Location: [place];  Service: Ophthalmology;  Laterality: Right;  DIABETIC, ORAL MED   CATARACT EXTRACTION W/PHACO Left 04/30/2018   Procedure: CATARACT EXTRACTION PHACO AND INTRAOCULAR LENS PLACEMENT ([facility]);  Surgeon: Leandrew Koyanagi, MD;  Location: [facility];  Service: Ophthalmology;  Laterality: Left;  [place] 01:04 AP% 21.3 CDE 13.66 Fluid pack lot # [id number]   COLONOSCOPY WITH PROPOFOL N/A 03/25/2018   Procedure: COLONOSCOPY WITH PROPOFOL;  Surgeon: Lin Landsman, MD;  [facility];  Service: Gastroenterology;  Laterality: N/A;   DENTAL SURGERY     EXTRACTIONS   ELECTROMAGNETIC NAVIGATION BROCHOSCOPY Right 01/27/2019   Procedure: ELECTROMAGNETIC NAVIGATION BRONCHOSCOPY;  Surgeon: [name], MD;  Location: [facility];  Service:  Cardiopulmonary;  Laterality: Right;   ENTEROSCOPY N/A 07/28/2020   Procedure: Push ENTEROSCOPY;  Surgeon: Lin Landsman, MD;  Location: [facility] ENDOSCOPY;  Service: Gastroenterology;  Laterality: N/A;   ESOPHAGOGASTRODUODENOSCOPY N/A 08/03/2018   Procedure: ESOPHAGOGASTRODUODENOSCOPY (EGD);  Surgeon: Lin Landsman, MD;  Location: [facility]COPY;  Service: Gastroenterology;  Laterality: N/A;   ESOPHAGOGASTRODUODENOSCOPY (EGD) WITH PROPOFOL N/A 02/10/2018   Procedure: ESOPHAGOGASTRODUODENOSCOPY (EGD) WITH PROPOFOL;  Surgeon: Jonathon Bellows, MD;  Location: [facility] ENDOSCOPY;  Service: Gastroenterology;  Laterality: N/A;   ESOPHAGOGASTRODUODENOSCOPY (EGD) WITH PROPOFOL N/A 03/25/2018  Procedure: ESOPHAGOGASTRODUODENOSCOPY (EGD) WITH PROPOFOL;  Surgeon: Lin Landsman, MD;  Location: [facility] ENDOSCOPY;  Service: Gastroenterology;  Laterality: N/A;   ESOPHAGOGASTRODUODENOSCOPY (EGD) WITH PROPOFOL N/A 04/22/2018   Procedure: ESOPHAGOGASTRODUODENOSCOPY (EGD) WITH PROPOFOL with band ligation;  Surgeon: Lin Landsman, MD;  Location: [place];  Service: Gastroenterology;  Laterality: N/A;   ESOPHAGOGASTRODUODENOSCOPY (EGD) WITH PROPOFOL N/A 06/24/2018   Procedure: ESOPHAGOGASTRODUODENOSCOPY (EGD) WITH PROPOFOL;  Surgeon: Lin Landsman, MD;  Location: [facility] ENDOSCOPY;  Service: Gastroenterology;  Laterality: N/A;   ESOPHAGOGASTRODUODENOSCOPY (EGD) WITH PROPOFOL N/A 11/19/2018   Procedure: ESOPHAGOGASTRODUODENOSCOPY (EGD) WITH PROPOFOL;  Surgeon: Lin Landsman, MD;  Location: [place];  Service: Gastroenterology;  Laterality: N/A;   ESOPHAGOGASTRODUODENOSCOPY (EGD) WITH PROPOFOL N/A 03/08/2020   Procedure: ESOPHAGOGASTRODUODENOSCOPY (EGD) WITH PROPOFOL;  Surgeon: Lin Landsman, MD;  Location: [facility] ENDOSCOPY;  Service: Gastroenterology;  Laterality: N/A;   EYE SURGERY     GIVENS CAPSULE STUDY N/A 03/29/2020   Procedure: GIVENS CAPSULE STUDY;  Surgeon: Lin Landsman, MD;   Location: [facility] ENDOSCOPY;  Service: Gastroenterology;  Laterality: N/A;   HEMORRHOID BANDING  03/25/2018   Procedure: HEMORRHOID BANDING;  Surgeon: Lin Landsman, MD;  Location: [facility] ENDOSCOPY;  Service: Gastroenterology;;   IR EMBO ART  VEN HEMORR LYMPH EXTRAV  INC GUIDE ROADMAPPING  08/07/2018   IR RADIOLOGIST EVAL & MGMT  09/03/2018   IR RADIOLOGIST EVAL & MGMT  12/17/2018   IR RADIOLOGIST EVAL & MGMT  06/24/2019   IR RADIOLOGIST EVAL & MGMT  01/22/2020   IR RADIOLOGIST EVAL & MGMT  02/09/2021   IR TIPS  08/07/2018   IR TRANSHEPATIC PORTOGRAM W HEMO  03/07/2021   IR US GUIDE VASC ACCESS RIGHT  03/07/2021   IR VENO/JUGULAR LEFT  03/07/2021   PORTA CATH INSERTION N/A 07/30/2020   Procedure: PORTA CATH INSERTION;  Surgeon: Katha Cabal, MD;  Location: [place] CV LAB;  Service: Cardiovascular;  Laterality: N/A;   RADIOLOGY WITH ANESTHESIA N/A 08/07/2018   Procedure: TIPS;  Surgeon: Corrie Mckusick, DO;  Location: [place];  Service: Anesthesiology;  Laterality: N/A;   TONSILLECTOMY       Social History   Tobacco Use   Smoking status: Never   Smokeless tobacco: Never  Vaping Use   Vaping Use: Never used  Substance Use Topics   Alcohol use: Not Currently    Comment: No EtOH for 30 years.  Never a heavy drinker.   Drug use: Never     Family History  Problem Relation Age of Onset   CAD Father    Heart attack Father 58   Cancer Maternal Uncle    Seizures Mother     No Known Allergies   REVIEW OF SYSTEMS (Negative unless checked)  Constitutional: _0 Weight loss  _1 Fever  _2 Chills Cardiac: _3 Chest pain   _4 Chest pressure   _5 Palpitations   _6 Shortness of breath when laying flat   _7 Shortness of breath at rest   _8 Shortness of breath with exertion. Vascular:  _9 Pain in legs with walking   _10 Pain in legs at rest   _11 Pain in legs when laying flat   _12 Claudication   _13 Pain in feet when walking  _14 Pain in feet at rest  _15 Pain in feet when laying flat   _16 History of DVT    _17 Phlebitis   _18 Swelling in legs   _19 Varicose veins   _20 Non-healing ulcers Pulmonary:   _21 Uses home oxygen   _22 Productive cough   _23 Hemoptysis   _24 Wheeze  _25 COPD   _26 Asthma Neurologic: Cirrhosis.  Fairly significant.  May be contributing to her GI bleed situation.  Dizziness  _0 Blackouts   _1 Seizures   _2 History of stroke   _3 History of TIA  _4 Aphasia   _5 Temporary blindness   _6 Dysphagia   _7 Weakness or numbness in arms   _8 Weakness or numbness in legs Musculoskeletal:  _9 Arthritis   _10 Joint swelling   _11 Joint pain   _12 Low back pain Hematologic:  _13 Easy bruising  _14 Easy bleeding   _15 Hypercoagulable state   _16 Anemic  _17 Hepatitis Gastrointestinal:  _18 Blood in stool   _19 Vomiting blood  _20 Gastroesophageal reflux/heartburn   _21 Difficulty swallowing. Genitourinary:  _22 Chronic kidney disease   _23 Difficult urination  _24 Frequent urination  _25 Burning with urination   _26 Blood in urine Skin:  _27 Rashes   _28 Ulcers   _29 Wounds Psychological:  _30 History of anxiety   _31  History of major depression.  Physical Examination  Vitals:   04/27/21 1530 04/27/21 1545 04/27/21 1600 04/27/21 1630  BP: (!) 120/56 (!) 110/54 (!) 117/52 117/73  Pulse: 65 60  60  Resp: 15 13 (!) 21 13  Temp: 98 F (36.7 C) 98.1 F (36.7 C)    TempSrc: Oral Oral    SpO2: 97% 97%  96%  Weight:      Height:       Body mass index is 38.78 kg/m. Gen:  WD/WN, NAD.  Appears older than stated age Head: [place]/AT, No temporalis wasting.  Ear/Nose/Throat: Hearing grossly intact, nares w/o erythema or drainage, oropharynx w/o Erythema/Exudate Eyes: Sclera non-icteric, conjunctiva clear Neck: Trachea midline.  No JVD.  Pulmonary:  Good air movement, respirations not labored, equal bilaterally.  Cardiac: RRR, normal S1, S2. Vascular:  Vessel Right Left  Radial Palpable Palpable                                   Gastrointestinal: soft, non-tender/non-distended. No guarding/reflex.  Musculoskeletal: M/S 5/5 throughout.  Extremities  without ischemic changes.  No deformity or atrophy.  2+ right lower extremity edema, 1+ left lower extremity edema. Neurologic: Sensation grossly intact in extremities.  Symmetrical.  Speech is fluent. Motor exam as listed above. Psychiatric: Judgment intact, Mood & affect appropriate for pt's clinical situation. Dermatologic: No rashes or ulcers noted.  No cellulitis or open wounds. Lymph : No Cervical, Axillary, or Inguinal lymphadenopathy.     CBC Lab Results  Component Value Date   WBC 3.2 (L) 04/27/2021   HGB 5.3 (L) 04/27/2021   HCT 16.5 (L) 04/27/2021   MCV 82.1 04/27/2021   PLT 106 (L) 04/27/2021    BMET    Component Value Date/Time   NA 130 (L) 04/27/2021 0945   NA 135 02/01/2021 0935   K 3.8 04/27/2021 0945   CL 96 (L) 04/27/2021 0945   CO2 23 04/27/2021 0945   GLUCOSE 131 (H) 04/27/2021 0945   BUN 26 (H) 04/27/2021 0945   BUN 25 02/01/2021 0935   CREATININE 1.88 (H) 04/27/2021 0945   CREATININE 2.20 (H) 02/09/2021 0000   CALCIUM 8.0 (L) 04/27/2021 0945   GFRNONAA 29 (L) 04/27/2021 0945   GFRNONAA 22 (L) 02/09/2021 0000   GFRAA 26 (L) 02/09/2021 0000   Estimated Creatinine Clearance: 30.5 mL/min (A) (by C-G formula based on SCr of 1.88 mg/dL (H)).  COAG Lab Results  Component Value Date   INR 1.3 (H) 04/27/2021   INR 1.3 (H) 03/07/2021   INR 1.1 06/16/2019    Radiology CT Head Wo Contrast  Result Date: 04/04/2021  CLINICAL DATA:  Altered mental status. EXAM: CT HEAD WITHOUT CONTRAST TECHNIQUE: Contiguous axial images were obtained from the base of the skull through the vertex without intravenous contrast. COMPARISON:  August 20, 2019 FINDINGS: Brain: There is mild cerebral atrophy with widening of the extra-axial spaces and ventricular dilatation. There are areas of decreased attenuation within the white matter tracts of the supratentorial brain, consistent with microvascular disease changes. Vascular: No hyperdense vessel or unexpected calcification. Skull:  Normal. Negative for fracture or focal lesion. Sinuses/Orbits: No acute finding. Other: The study is limited secondary to patient motion. IMPRESSION: Limited study, as described above, without and acute intracranial abnormality. Electronically Signed   By: Virgina Norfolk M.D.   On: 04/04/2021 01:10   US Venous Img Lower Bilateral (DVT)  Result Date: 04/07/2021 CLINICAL DATA:  Initial evaluation for bilateral foot pain. EXAM: BILATERAL LOWER EXTREMITY VENOUS DOPPLER ULTRASOUND TECHNIQUE: Gray-scale sonography with graded compression, as well as color Doppler and duplex ultrasound were performed to evaluate the lower extremity deep venous systems from the level of the common femoral vein and including the common femoral, femoral, profunda femoral, popliteal and calf veins including the posterior tibial, peroneal and gastrocnemius veins when visible. The superficial great saphenous vein was also interrogated. Spectral Doppler was utilized to evaluate flow at rest and with distal augmentation maneuvers in the common femoral, femoral and popliteal veins. COMPARISON:  Prior study from 03/30/2020 FINDINGS: RIGHT LOWER EXTREMITY Common Femoral Vein: No evidence of thrombus. Normal compressibility, respiratory phasicity and response to augmentation. Saphenofemoral Junction: No evidence of thrombus. Normal compressibility and flow on color Doppler imaging. Profunda Femoral Vein: No evidence of thrombus. Normal compressibility and flow on color Doppler imaging. Femoral Vein: No evidence of thrombus. Normal compressibility, respiratory phasicity and response to augmentation. Popliteal Vein: Hypoechoic material distending the right popliteal vein suspicious for thrombus. This is nonocclusive. Loss of normal compressibility. Calf Veins: Nonocclusive thrombus extends into the right posterior tibial and peroneal veins. Superficial Great Saphenous Vein: No evidence of thrombus. Normal compressibility. Venous Reflux:  None.  Other Findings:  None. LEFT LOWER EXTREMITY Common Femoral Vein: Again seen is heterogeneous echogenic material positioned along the wall of the distal left common femoral vein, likely reflecting chronic thrombus. Luminal narrowing with decreased flow seen at this level. This is relatively similar in appearance from previous. The remainder of the visualized left common femoral vein appears widely patent. Saphenofemoral Junction: No evidence of thrombus. Normal compressibility and flow on color Doppler imaging. Profunda Femoral Vein: No evidence of thrombus. Normal compressibility and flow on color Doppler imaging. Femoral Vein: No evidence of thrombus. Normal compressibility, respiratory phasicity and response to augmentation. Popliteal Vein: No evidence of thrombus. Normal compressibility, respiratory phasicity and response to augmentation. Calf Veins: No evidence of thrombus. Normal compressibility and flow on color Doppler imaging. Superficial Great Saphenous Vein: No evidence of thrombus. Normal compressibility. Venous Reflux:  None. Other Findings:  None. IMPRESSION: 1. Acute nonocclusive DVT involving the right popliteal vein extending into the veins of the right calf. 2. 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 No other evidence for acute DVT within the left lower extremity. Electronically Signed   By: Jeannine Boga M.D.   On: 04/07/2021 03:34      Assessment/Plan 1.  DVT with anemia and GI bleed requiring cessation of anticoagulation.  IVC filter is appropriate.  This will be placed tomorrow.  Risks and benefits were discussed with  the patient. 2.  Anemia with GI bleed.  GI is seeing.  They significant bleed source is localized I would be happy to consider embolization if appropriate for the patient. 3.  Cirrhosis.  Fairly significant.  May be contributing to her GI bleed situation. 4.  Diabetes.  blood glucose control important in reducing the progression of atherosclerotic disease. Also, involved in wound healing. On appropriate medications.    Leotis Pain, MD  04/27/2021 4:47 PM    This note was created with Dragon medical transcription system.  Any error is purely unintentional

## 2021-04-27 NOTE — ED Notes (Signed)
First Nurse Note: Pt to ED via POV, pt states that she had labs done yesterday, Hgb was 5.5. pt advised to come to ED. Pt reports dizziness when getting out of bed this morning.

## 2021-04-28 ENCOUNTER — Encounter
Admission: EM | Disposition: A | Discharge status: Home Health | Payer: Self-pay | Source: Home / Self Care | Attending: Internal Medicine

## 2021-04-28 ENCOUNTER — Inpatient Hospital Stay: Payer: Medicare HMO | Admitting: Anesthesiology

## 2021-04-28 ENCOUNTER — Encounter: Payer: Self-pay | Admitting: Internal Medicine

## 2021-04-28 DIAGNOSIS — I82401 Acute embolism and thrombosis of unspecified deep veins of right lower extremity: Secondary | ICD-10-CM

## 2021-04-28 DIAGNOSIS — D62 Acute posthemorrhagic anemia: Secondary | ICD-10-CM

## 2021-04-28 DIAGNOSIS — I5032 Chronic diastolic (congestive) heart failure: Secondary | ICD-10-CM

## 2021-04-28 HISTORY — PX: IVC FILTER INSERTION: CATH118245

## 2021-04-28 HISTORY — PX: ESOPHAGOGASTRODUODENOSCOPY (EGD) WITH PROPOFOL: SHX5813

## 2021-04-28 LAB — BASIC METABOLIC PANEL
Anion gap: 5 (ref 5–15)
BUN: 23 mg/dL (ref 8–23)
CO2: 24 mmol/L (ref 22–32)
Calcium: 7.4 mg/dL — ABNORMAL LOW (ref 8.9–10.3)
Chloride: 101 mmol/L (ref 98–111)
Creatinine, Ser: 1.65 mg/dL — ABNORMAL HIGH (ref 0.44–1.00)
GFR, Estimated: 33 mL/min — ABNORMAL LOW (ref >60)
Glucose, Bld: 109 mg/dL — ABNORMAL HIGH (ref 70–99)
Potassium: 3.8 mmol/L (ref 3.5–5.1)
Sodium: 130 mmol/L — ABNORMAL LOW (ref 135–145)

## 2021-04-28 LAB — CBC
HCT: 21.1 % — ABNORMAL LOW (ref 36.0–46.0)
HCT: 21.9 % — ABNORMAL LOW (ref 36.0–46.0)
HCT: 23.7 % — ABNORMAL LOW (ref 36.0–46.0)
Hemoglobin: 7 g/dL — ABNORMAL LOW (ref 12.0–15.0)
Hemoglobin: 7.4 g/dL — ABNORMAL LOW (ref 12.0–15.0)
Hemoglobin: 7.7 g/dL — ABNORMAL LOW (ref 12.0–15.0)
MCH: 27.2 pg (ref 26.0–34.0)
MCH: 27.5 pg (ref 26.0–34.0)
MCH: 27 pg (ref 26.0–34.0)
MCHC: 33.2 g/dL (ref 30.0–36.0)
MCHC: 33.8 g/dL (ref 30.0–36.0)
MCHC: 32.5 g/dL (ref 30.0–36.0)
MCV: 82.1 fL (ref 80.0–100.0)
MCV: 81.4 fL (ref 80.0–100.0)
MCV: 83.2 fL (ref 80.0–100.0)
Platelets: 104 10*3/uL — ABNORMAL LOW (ref 150–400)
Platelets: 109 10*3/uL — ABNORMAL LOW (ref 150–400)
Platelets: 103 10*3/uL — ABNORMAL LOW (ref 150–400)
RBC: 2.57 MIL/uL — ABNORMAL LOW (ref 3.87–5.11)
RBC: 2.69 MIL/uL — ABNORMAL LOW (ref 3.87–5.11)
RBC: 2.85 MIL/uL — ABNORMAL LOW (ref 3.87–5.11)
RDW: 16.9 % — ABNORMAL HIGH (ref 11.5–15.5)
RDW: 17.2 % — ABNORMAL HIGH (ref 11.5–15.5)
RDW: 17.3 % — ABNORMAL HIGH (ref 11.5–15.5)
WBC: 3.7 10*3/uL — ABNORMAL LOW (ref 4.0–10.5)
WBC: 4.2 10*3/uL (ref 4.0–10.5)
WBC: 5.8 10*3/uL (ref 4.0–10.5)
nRBC: 0 % (ref 0.0–0.2)
nRBC: 0 % (ref 0.0–0.2)
nRBC: 0 % (ref 0.0–0.2)

## 2021-04-28 LAB — GLUCOSE, CAPILLARY
Glucose-Capillary: 122 mg/dL — ABNORMAL HIGH (ref 70–99)
Glucose-Capillary: 84 mg/dL (ref 70–99)
Glucose-Capillary: 87 mg/dL (ref 70–99)
Glucose-Capillary: 74 mg/dL (ref 70–99)

## 2021-04-28 SURGERY — IVC FILTER INSERTION
Anesthesia: Moderate Sedation

## 2021-04-28 SURGERY — ESOPHAGOGASTRODUODENOSCOPY (EGD) WITH PROPOFOL
Anesthesia: General

## 2021-04-28 MED ORDER — LIDOCAINE HCL (PF) 2 % IJ SOLN
INTRAMUSCULAR | Status: AC
  Filled 2021-04-28: qty 2

## 2021-04-28 MED ORDER — SODIUM CHLORIDE 0.9 % IV SOLN: INTRAVENOUS | Status: DC

## 2021-04-28 MED ORDER — PROPOFOL 500 MG/50ML IV EMUL
INTRAVENOUS | Status: DC | PRN
  Administered 2021-04-28: 100 ug/kg/min via INTRAVENOUS

## 2021-04-28 MED ORDER — LIDOCAINE HCL (CARDIAC) PF 100 MG/5ML IV SOSY
PREFILLED_SYRINGE | INTRAVENOUS | Status: DC | PRN
  Administered 2021-04-28: 40 mg via INTRAVENOUS

## 2021-04-28 MED ORDER — MIDAZOLAM HCL 2 MG/2ML IJ SOLN
INTRAMUSCULAR | Status: DC | PRN
  Administered 2021-04-28: 1 mg via INTRAVENOUS

## 2021-04-28 MED ORDER — PROPOFOL 500 MG/50ML IV EMUL
INTRAVENOUS | Status: AC
  Filled 2021-04-28: qty 50

## 2021-04-28 MED ORDER — FENTANYL CITRATE (PF) 100 MCG/2ML IJ SOLN
INTRAMUSCULAR | Status: DC | PRN
  Administered 2021-04-28: 50 ug via INTRAVENOUS

## 2021-04-28 MED ORDER — MIDAZOLAM HCL 2 MG/2ML IJ SOLN
INTRAMUSCULAR | Status: AC
  Filled 2021-04-28: qty 2

## 2021-04-28 MED ORDER — CEFAZOLIN SODIUM-DEXTROSE 2-4 GM/100ML-% IV SOLN
INTRAVENOUS | Status: AC
  Filled 2021-04-28: qty 100

## 2021-04-28 MED ORDER — FENTANYL CITRATE (PF) 100 MCG/2ML IJ SOLN
INTRAMUSCULAR | Status: AC
  Filled 2021-04-28: qty 2

## 2021-04-28 MED ORDER — FUROSEMIDE 10 MG/ML IJ SOLN: 80.0000 mg | Freq: Once | INTRAMUSCULAR | Status: DC

## 2021-04-28 MED ORDER — CEFAZOLIN SODIUM-DEXTROSE 2-4 GM/100ML-% IV SOLN
2.0000 g | INTRAVENOUS | Status: AC
Start: 2021-04-28 — End: 2021-04-28
  Administered 2021-04-28: 2 g via INTRAVENOUS

## 2021-04-28 MED ORDER — IODIXANOL 320 MG/ML IV SOLN
INTRAVENOUS | Status: DC | PRN
  Administered 2021-04-28: 15 mL via INTRAVENOUS

## 2021-04-28 SURGICAL SUPPLY — 4 items
COVER PROBE U/S 5X48 (MISCELLANEOUS) ×1 IMPLANT
KIT FEMORAL DEL DENALI (Miscellaneous) ×1 IMPLANT
PACK ANGIOGRAPHY (CUSTOM PROCEDURE TRAY) ×2 IMPLANT
WIRE GUIDERIGHT .035X150 (WIRE) ×2 IMPLANT

## 2021-04-28 NOTE — Anesthesia Postprocedure Evaluation (Signed)
Anesthesia Post Note  Patient: Crystal [name]  Procedure(s) Performed: ESOPHAGOGASTRODUODENOSCOPY (EGD) WITH PROPOFOL  Patient location during evaluation: PACU Anesthesia Type: General Level of consciousness: awake and alert, awake and oriented Pain management: pain level controlled Vital Signs Assessment: post-procedure vital signs reviewed and stable Respiratory status: spontaneous breathing, nonlabored ventilation and respiratory function stable Cardiovascular status: blood pressure returned to baseline and stable Postop Assessment: no apparent nausea or vomiting Anesthetic complications: no   No notable events documented.   Last Vitals:  Vitals:   04/28/21 1457 04/28/21 1922  BP: (!) 118/59 (!) 118/50  Pulse: 63 60  Resp: 15 17  Temp:  36.7 C  SpO2: 93%     Last Pain:  Vitals:   04/28/21 1457  TempSrc:   PainSc: 0-No pain                 Phill Mutter

## 2021-04-28 NOTE — Progress Notes (Signed)
[facility] NAME: Crystal [name]    MR#:  [id number]  DATE OF BIRTH:  06/11/1951  SUBJECTIVE:   patient came in for Dr. low stools. She started noticing black stools. Patient is status post IVC filter placement. She recently stopped taking eliquis which was given for DVT lower extremity. Patient is status post three unit blood transfusion. She is scheduled to have EEG today. Denies any vomiting.  no family in the room REVIEW OF SYSTEMS:   Review of Systems  Constitutional:  Positive for malaise/fatigue. Negative for chills, fever and weight loss.  HENT:  Negative for ear discharge, ear pain and nosebleeds.   Eyes:  Negative for blurred vision, pain and discharge.  Respiratory:  Negative for sputum production, shortness of breath, wheezing and stridor.   Cardiovascular:  Negative for chest pain, palpitations, orthopnea and PND.  Gastrointestinal:  Positive for melena. Negative for abdominal pain, diarrhea, nausea and vomiting.  Genitourinary:  Negative for frequency and urgency.  Musculoskeletal:  Negative for back pain and joint pain.  Neurological:  Positive for weakness. Negative for sensory change, speech change and focal weakness.  Psychiatric/Behavioral:  Negative for depression and hallucinations. The patient is not nervous/anxious.   Tolerating Diet:NPO Tolerating PT:   DRUG ALLERGIES:  No Known Allergies  VITALS:  Blood pressure (!) 98/45, pulse 65, temperature 98.9 F (37.2 C), temperature source Oral, resp. rate 15, height 5' 2" (1.575 m), weight 97.5 kg, SpO2 97 %.  PHYSICAL EXAMINATION:   Physical Exam  GENERAL:  70 y.o.-year-old patient lying in the bed with no acute distress.  HEENT: Head atraumatic, normocephalic. Oropharynx and nasopharynx clear.  Pallor+ LUNGS: Normal breath sounds bilaterally, no wheezing, rales, rhonchi. No use of accessory muscles of respiration.  CARDIOVASCULAR: S1, S2 normal. No murmurs,  rubs, or gallops.  ABDOMEN: Soft, nontender, nondistended. Bowel sounds present. No organomegaly or mass.  EXTREMITIES: No cyanosis, clubbing or edema b/l.    NEUROLOGIC: non-focal PSYCHIATRIC:  patient is alert and oriented x 3.  SKIN: No obvious rash, lesion, or ulcer.   LABORATORY PANEL:  CBC Recent Labs  Lab 04/28/21 0555  WBC 4.2  HGB 7.4*  HCT 21.9*  PLT 109*    Chemistries  Recent Labs  Lab 04/27/21 0945 04/28/21 0559  NA 130* 130*  K 3.8 3.8  CL 96* 101  CO2 23 24  GLUCOSE 131* 109*  BUN 26* 23  CREATININE 1.88* 1.65*  CALCIUM 8.0* 7.4*  AST 27  --   ALT 12  --   ALKPHOS 130*  --   BILITOT 1.3*  --    Cardiac Enzymes No results for input(s): TROPONINI in the last 168 hours. RADIOLOGY:  PERIPHERAL VASCULAR CATHETERIZATION  Result Date: 04/28/2021 See surgical note for result.  ASSESSMENT AND PLAN:   Ellan A Grimmett is a 70 y.o. female with medical history significant of liver cirrhosis, s/p of TIPS, hepatitis C, DM, GIB, PUD, angiodysplasia of the small intestine, GAVE (gastric antral vascular ectasis), anemia, dCHF, CAD, CKD-IV, DVT (stopped Eliquis 1 week ago per pt), thrombocytopenia, who presents with dark stool.  GI bleeding and anemia due to blood loss: Hgb 10.0 -->5.9. Dr. Marius Ditch of GI is consulted.  - Hgb 5.9>>transfuse 2 units >> 7.4 - IV pantoprazole gtt - sOctreotide gtt - Zofran IV for nausea - Avoid NSAIDs and SQ heparin - Monitor closely and follow q6h cbc, transfuse as necessary, if Hgb<7.0 -hold aspirin -- G.I. consultation with  Dr. Marius Ditch. Plans for EGD today.   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  -- Mental status normal. Ammonia 45 -continue lactulose 20 g 3 times daily -Continue Xifaxan   Thrombocytopenia ([place]): Platelet 132.  Most likely due to liver cirrhosis   Chronic diastolic CHF (congestive heart failure) ([place]): 2D echo on 12/14/2020  showed EF of 55-60% with grade 1 diastolic dysfunction.  Patient has  2+ bilateral leg edema, which is likely due to DVT partially . No worsening shortness of breath.  Does not seem to have CHF acute exacerbation -resume torsemide and spironolactone    Type II diabetes mellitus with renal manifestations (HCC) -SSI for now   CAD (coronary artery disease): No CP -watch closely   CKD (chronic kidney disease), stage IV ([place]): stable.  Recent baseline creatinine 1.5-2.0.  Her creatinine is 1.88, BUN 26 today. 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 Lucky Cowboy appreciated pt is s/p IVC filer placement             DVT ppx: none (cannot use heparin/Lovenox due to GI bleeding, cannot use SCD due to DVT) Code Status: Full code Family Communication: spoke with friend [name] Disposition Plan:  Anticipate discharge back to previous environment Consults called:  Dr. Lucky Cowboy of [facility] and Dr. Marius Ditch of GI Admission status and Level of care: med-surg       Status is: Inpatient   Remains inpatient appropriate because:Inpatient level of care appropriate due to severity of illness   Dispo: The patient is from: Home              Anticipated d/c is to: Home              Patient currently is not medically stable to d/c.              Difficult to place patient No        Level of care: Med-Surg        TOTAL TIME TAKING CARE OF THIS PATIENT: 35 minutes.  >50% time spent on counselling and coordination of care  Note: This dictation was prepared with Dragon dictation along with smaller phrase technology. Any transcriptional errors that result from this process are unintentional.  Fritzi Mandes M.D    [facility]   CC: Primary care physician; Pcp, No Patient ID: Lieutenant Diego, female   DOB: 11-06-1951, 70 y.o.   MRN:  [id number]

## 2021-04-28 NOTE — Op Note (Signed)
[facility]   OPERATIVE NOTE    PRE-OPERATIVE DIAGNOSIS: DVT and anemia with GI bleed  POST-OPERATIVE DIAGNOSIS: same as above  PROCEDURE: 1.   Ultrasound guidance for vascular access to the right femoral vein 2.   Catheter placement into the inferior vena cava 3.   Inferior venacavogram 4.   Placement of a Bard Denali IVC filter  SURGEON: Leotis Pain, MD  ASSISTANT(S): None  ANESTHESIA: local with Moderate Conscious Sedation for approximately 3 minutes using 1 mg of Versed and 50 mcg of Fentanyl  ESTIMATED BLOOD LOSS: minimal  CONTRAST: 15 cc  FLUORO TIME: less than one minute  FINDING(S): 1.  Patent IVC  SPECIMEN(S):  none  INDICATIONS:   Crystal [name] is a 70 y.o. female who presents with GI bleed and anemia after recent diagnosis of DVT and anticoagulation. Anticoagulation will need to be stopped.  Inferior vena cava filter is indicated for this reason.  Risks and benefits including filter thrombosis, migration, fracture, bleeding, and infection were all discussed.  We discussed that all IVC filters that we place can be removed if desired from the patient once the need for the filter has passed.    DESCRIPTION: After obtaining full informed written consent, the patient was brought back to the vascular suite. The skin was sterilely prepped and draped in a sterile surgical field was created. Moderate conscious sedation was administered during a face to face encounter with the patient throughout the procedure with my supervision of the RN administering medicines and monitoring the patient's vital signs, pulse oximetry, telemetry and mental status throughout from the start of the procedure until the patient was taken to the recovery room. The right femoral vein was accessed under direct ultrasound guidance without difficulty with a Seldinger needle and a J-wire was then placed. After skin nick and dilatation, the delivery sheath was placed into the inferior  vena cava and an inferior venacavogram was performed. This demonstrated a patent IVC with the level of the renal veins at L1-L2.  The filter was then deployed into the inferior vena cava at the level of L2 just below the renal veins. The delivery sheath was then removed. Pressure was held. Sterile dressings were placed. The patient tolerated the procedure well and was taken to the recovery room in stable condition.  COMPLICATIONS: None  CONDITION: Stable  Leotis Pain  04/28/2021, 10:18 AM   This note was created with Dragon Medical transcription system. Any errors in dictation are purely unintentional.

## 2021-04-28 NOTE — Op Note (Signed)
[facility] Gastroenterology Patient Name: Crystal [name] Procedure Date: 04/28/2021 2:08 PM MRN: [id number] Account #: [id number] Date of Birth: [birth year] Admit Type: Inpatient Age: 70 Room: [facility] ENDO ROOM 2 Gender: Female Note Status: Finalized Procedure:             Upper GI endoscopy Indications:           Acute post hemorrhagic anemia, Melena Providers:             Lin Landsman MD, MD Referring MD:          No Local Md, MD (Referring MD) Medicines:             General Anesthesia Complications:         No immediate complications. Estimated blood loss: None. Procedure:             Pre-Anesthesia Assessment:                        - Prior to the procedure, a History and Physical was                         performed, and patient medications and allergies were                         reviewed. The patient is competent. The risks and                         benefits of the procedure and the sedation options and                         risks were discussed with the patient. All questions                         were answered and informed consent was obtained.                         Patient identification and proposed procedure were                         verified by the physician, the nurse, the                         anesthesiologist, the anesthetist and the technician                         in the pre-procedure area in the procedure room in the                         endoscopy suite. Mental Status Examination: alert and                         oriented. Airway Examination: normal oropharyngeal                         airway and neck mobility. Respiratory Examination:                         clear to auscultation. CV Examination: normal.  Prophylactic Antibiotics: The patient does not require                         prophylactic antibiotics. Prior Anticoagulants: The                         patient has taken no previous anticoagulant  or                         antiplatelet agents. ASA Grade Assessment: IV - A                         patient with severe systemic disease that is a                         constant threat to life. After reviewing the risks and                         benefits, the patient was deemed in satisfactory                         condition to undergo the procedure. The anesthesia                         plan was to use general anesthesia. Immediately prior                         to administration of medications, the patient was                         re-assessed for adequacy to receive sedatives. The                         heart rate, respiratory rate, oxygen saturations,                         blood pressure, adequacy of pulmonary ventilation, and                         response to care were monitored throughout the                         procedure. The physical status of the patient was                         re-assessed after the procedure.                        After obtaining informed consent, the endoscope was                         passed under direct vision. Throughout the procedure,                         the patient's blood pressure, pulse, and oxygen                         saturations were monitored continuously. The Endoscope  was introduced through the mouth, and advanced to the                         second part of duodenum. The upper GI endoscopy was                         accomplished without difficulty. The patient tolerated                         the procedure well. Findings:      The duodenal bulb and second portion of the duodenum were normal.      A single 5 mm angioectasia with no bleeding was found on the greater       curvature of the gastric body. Coagulation for hemostasis using argon       plasma was successful.      Severe, diffuse gastric antral vascular ectasia without bleeding was       present in the gastric antrum. Coagulation  for hemostasis using argon       plasma was successful.      The examined esophagus was normal. Impression:            - Normal duodenal bulb and second portion of the                         duodenum.                        - A single non-bleeding angioectasia in the stomach.                         Treated with argon plasma coagulation (APC).                        - Gastric antral vascular ectasia without bleeding.                         Treated with argon plasma coagulation (APC).                        - Normal esophagus.                        - No specimens collected. Recommendation:        - Return patient to hospital ward for ongoing care.                        - Full liquid diet today.                        - Continue present medications.                        - Perform a small bowel enteroscopy (SBE) tomorrow. Procedure Code(s):     --- Professional ---                        [phone], Esophagogastroduodenoscopy, flexible,                         transoral; with control of bleeding, any method Diagnosis Code(s):     ---  Professional ---                        K53.976, Angiodysplasia of stomach and duodenum                         without bleeding                        D62, Acute posthemorrhagic anemia                        K92.1, Melena (includes Hematochezia) CPT copyright 2019 American Medical Association. All rights reserved. The codes documented in this report are preliminary and upon coder review may  be revised to meet current compliance requirements. Dr. Ulyess Mort Lin Landsman MD, MD 04/28/2021 2:35:00 PM This report has been signed electronically. Number of Addenda: 0 Note Initiated On: 04/28/2021 2:08 PM Estimated Blood Loss:  Estimated blood loss: none.      [facility]

## 2021-04-28 NOTE — Transfer of Care (Signed)
Immediate Anesthesia Transfer of Care Note  Patient: Crystal [name]  Procedure(s) Performed: ESOPHAGOGASTRODUODENOSCOPY (EGD) WITH PROPOFOL  Patient Location: PACU  Anesthesia Type:General  Level of Consciousness: awake and sedated  Airway & Oxygen Therapy: Patient Spontanous Breathing and Patient connected to face mask oxygen  Post-op Assessment: Report given to RN and Post -op Vital signs reviewed and stable  Post vital signs: Reviewed and stable  Last Vitals:  Vitals Value Taken Time  BP    Temp    Pulse    Resp    SpO2      Last Pain:  Vitals:   04/28/21 1346  TempSrc: Temporal  PainSc: 0-No pain         Complications: No notable events documented.

## 2021-04-28 NOTE — Interval H&P Note (Signed)
History and Physical Interval Note:  04/28/2021 9:35 AM  Crystal [name]  has presented today for surgery, with the diagnosis of DVT, GI bleed.  The various methods of treatment have been discussed with the patient and family. After consideration of risks, benefits and other options for treatment, the patient has consented to  Procedure(s): IVC FILTER INSERTION (N/A) as a surgical intervention.  The patient's history has been reviewed, patient examined, no change in status, stable for surgery.  I have reviewed the patient's chart and labs.  Questions were answered to the patient's satisfaction.     Leotis Pain

## 2021-04-28 NOTE — TOC Initial Note (Addendum)
Transition of Care [facility]) - Initial/Assessment Note    Patient Details  Name: Crystal [name] MRN: [id number] Date of Birth: [birth year]  [facility] CM/SW Contact:    Beverly Sessions, RN Phone Number: 04/28/2021, 10:31 AM  Clinical Narrative:                 Patient currently off the floor for IVC filter placement  Patient with extreme risk for readmission score.  Patient was assessed by TOC on 04/07/21 At that time "Patient admitted to the hospital with metabolic encephalopathy.  RNCM met with patient at the bedside, her friend Crystal [name] is sitting at the bedside.  Patient and Crystal [name] live together in [place].  Patient reports that before coming into the hospital she was independent and drives.  PT is recommending HH at discharge.  Plan for discharge is tomorrow.  Patient agrees to home health services  Patient's friend Crystal [name] will transport her home.  Patient needs a RW, it will be ordered from [employer] and delivered to the patient's room."  Previous admission patient received RW and BSC Patient open with RN and PT through [place] home [facility].  [name] with [facility] notified of admission    Expected Discharge Plan: [place] Barriers to Discharge: Continued Medical Work up   Patient Goals and CMS Choice        Expected Discharge Plan and Services Expected Discharge Plan: [place]                                              Prior Living Arrangements/Services   Lives with:: Friends              Current home services: DME    Activities of Daily Living Home Assistive Devices/Equipment: Cane (specify quad or straight) ADL Screening (condition at time of admission) Patient's cognitive ability adequate to safely complete daily activities?: Yes Is the patient deaf or have difficulty hearing?: No Does the patient have difficulty seeing, even when wearing glasses/contacts?: No Does the patient have difficulty  concentrating, remembering, or making decisions?: No Patient able to express need for assistance with ADLs?: Yes Does the patient have difficulty dressing or bathing?: No Independently performs ADLs?: No Communication: Independent Dressing (OT): Needs assistance Is this a change from baseline?: Change from baseline, expected to last <3days Grooming: Needs assistance Is this a change from baseline?: Change from baseline, expected to last <3 days Feeding: Independent Bathing: Needs assistance Is this a change from baseline?: Change from baseline, expected to last <3 days Toileting: Independent with device (comment) In/Out Bed: Needs assistance Is this a change from baseline?: Change from baseline, expected to last <3 days Walks in Home: Independent with device (comment) Does the patient have difficulty walking or climbing stairs?: Yes Weakness of Legs: Both Weakness of Arms/Hands: None  Permission Sought/Granted                  Emotional Assessment       Orientation: : Oriented to Self, Oriented to  Time, Oriented to Place, Oriented to Situation Alcohol / Substance Use: Not Applicable Psych Involvement: No (comment)  Admission diagnosis:  Acute blood loss anemia [D62] GI bleeding [K92.2] Acute upper GI bleed [K92.2] Patient Active Problem List   Diagnosis Date Noted   GI bleeding 04/27/2021   Type II diabetes  mellitus with renal manifestations ([place]) 04/27/2021   CAD (coronary artery disease) 04/27/2021   CKD (chronic kidney disease), stage IV ([place]) 04/27/2021   DVT (deep venous thrombosis) ([place]) 04/27/2021   Anemia due to blood loss 04/27/2021   DVT of lower limb, acute ([place]) 04/08/2021   Liver cirrhosis secondary to NASH ([place]) 04/08/2021   Thrombocytopenia ([place]) 04/08/2021   Chronic diastolic CHF (congestive heart failure) ([place]) 04/08/2021   Fluid retention 02/21/2021   Edema 12/09/2020   Acute on chronic diastolic heart failure ([place]) 12/09/2020   Pulmonary  nodules 05/02/2020   Hepatic encephalopathy ([place]) 04/23/2020   Left ovarian cyst 10/06/2019   Hypokalemia 08/21/2019   AMS (altered mental status) 08/20/2019   Acute osteomyelitis of phalanx of hand ([place]) 06/12/2019   Pain in finger of right hand 06/04/2019   Type 2 diabetes mellitus without complication, without long-term current use of insulin ([place]) 04/02/2019   Aortic atherosclerosis ([place]) 04/02/2019   Coronary artery disease involving native coronary artery of native heart without angina pectoris 04/02/2019   Acute on chronic heart failure with preserved ejection fraction (HFpEF) ([place]) 04/02/2019   Lung nodules    Volume overload 12/11/2018   GAVE (gastric antral vascular ectasia)    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   Type 2 diabetes mellitus with stage 3 chronic kidney disease ([place]) 08/03/2018   Hx of esophageal varices    Cirrhosis of liver without ascites ([place])    Angiodysplasia of small intestine ([place])    Adnexal cyst 03/16/2018   Goals of care, counseling/discussion 03/16/2018   Right middle lobe pulmonary nodule 03/14/2018   Right lower lobe pulmonary nodule 02/18/2018   Iron deficiency anemia due to chronic blood loss 02/18/2018   B12 deficiency 02/18/2018   Anticentromere antibodies present 02/18/2018   Hepatitis C virus infection without hepatic coma 02/18/2018   PCP:  Pcp, No Pharmacy:   [id number] [place] [id number]: [phone]: [phone]    Social Determinants of Health (SDOH) Interventions    Readmission Risk Interventions Readmission Risk Prevention Plan 04/28/2021 04/07/2021  Transportation Screening Complete Complete  Medication Review (Press photographer Complete Complete  PCP or Specialist appointment within 3-5 days of discharge - Complete  HRI or [place] Complete  SW Recovery Care/Counseling Consult - Complete   Palliative Care Screening Not Applicable Not ALeonNot Applicable Not Applicable  Some recent data might be hidden

## 2021-04-28 NOTE — Anesthesia Procedure Notes (Signed)
Date/Time: 04/28/2021 2:14 PM Performed by: Vaughan Sine Pre-anesthesia Checklist: Patient identified, Emergency Drugs available, Suction available, Patient being monitored and Timeout performed Patient Re-evaluated:Patient Re-evaluated prior to induction Oxygen Delivery Method: Simple face mask Preoxygenation: Pre-oxygenation with 100% oxygen Induction Type: IV induction Airway Equipment and Method: Bite block Placement Confirmation: CO2 detector and positive ETCO2

## 2021-04-28 NOTE — Anesthesia Preprocedure Evaluation (Signed)
Anesthesia Evaluation  Patient identified by MRN, date of birth, ID band Patient awake    Reviewed: Allergy & Precautions, H&P , NPO status , Patient's Chart, lab work & pertinent test results, reviewed documented beta blocker date and time   Airway Mallampati: II   Neck ROM: full    Dental  (+) Poor Dentition   Pulmonary neg pulmonary ROS,    Pulmonary exam normal        Cardiovascular Exercise Tolerance: Poor (-) angina+ CAD and +CHF  Normal cardiovascular exam Rhythm:regular Rate:Normal     Neuro/Psych negative neurological ROS  negative psych ROS   GI/Hepatic PUD, (+) Hepatitis -  Endo/Other  negative endocrine ROSdiabetes  Renal/GU Renal disease  negative genitourinary   Musculoskeletal   Abdominal   Peds  Hematology  (+) Blood dyscrasia, anemia ,   Anesthesia Other Findings liver cirrhosis, s/p of TIPS, hepatitis C, DM, GIB, PUD, angiodysplasia of the small intestine, GAVE (gastric antral vascular ectasis), anemia, dCHF, CAD, CKD-IV, DVT (stopped Eliquis 1 week ago per pt), thrombocytopenia, who presents with dark stool.   . Acute GI bleeding . Acute upper gastrointestinal bleeding . Anemia   TAKES IRON TAB . Arthritis   SHOULDER . B12 deficiency 02/18/2018 . Bleeding   GI  3/19 . Bronchitis   HX OF . CHF (congestive heart failure) ([place])  . Cirrhosis ([place])  . Colon cancer screening  . Diabetes mellitus without complication ([place])   TYPE 2 . Fatty (change of) liver, not elsewhere classified  . Full dentures   UPPER AND LOWER . Iron deficiency anemia due to chronic blood loss 02/18/2018 . Lung nodule, multiple  . Pernicious anemia 02/22/2018 . PUD (peptic ulcer disease)  . Raynaud's syndrome  . Upper GI bleeding 08/03/2018     Reproductive/Obstetrics negative OB ROS                             Anesthesia Physical  Anesthesia Plan  ASA:  4  Anesthesia Plan: General   Post-op Pain Management:    Induction: Intravenous  PONV Risk Score and Plan: 2 and Propofol infusion and TIVA  Airway Management Planned: Natural Airway and Nasal Cannula  Additional Equipment:   Intra-op Plan:   Post-operative Plan:   Informed Consent: I have reviewed the patients History and Physical, chart, labs and discussed the procedure including the risks, benefits and alternatives for the proposed anesthesia with the patient or authorized representative who has indicated his/her understanding and acceptance.     Dental Advisory Given  Plan Discussed with: CRNA, Anesthesiologist and Surgeon  Anesthesia Plan Comments:         Anesthesia Quick Evaluation

## 2021-04-29 ENCOUNTER — Encounter
Admission: EM | Disposition: A | Discharge status: Home Health | Payer: Self-pay | Source: Home / Self Care | Attending: Internal Medicine

## 2021-04-29 ENCOUNTER — Encounter: Payer: Self-pay | Admitting: Anesthesiology

## 2021-04-29 ENCOUNTER — Inpatient Hospital Stay: Payer: Medicare HMO

## 2021-04-29 ENCOUNTER — Encounter: Payer: Self-pay | Admitting: Gastroenterology

## 2021-04-29 DIAGNOSIS — R509 Fever, unspecified: Secondary | ICD-10-CM

## 2021-04-29 DIAGNOSIS — K921 Melena: Secondary | ICD-10-CM

## 2021-04-29 DIAGNOSIS — K31819 Angiodysplasia of stomach and duodenum without bleeding: Secondary | ICD-10-CM

## 2021-04-29 DIAGNOSIS — J690 Pneumonitis due to inhalation of food and vomit: Secondary | ICD-10-CM

## 2021-04-29 LAB — TYPE AND SCREEN
ABO/RH(D): O POS
Antibody Screen: NEGATIVE
Unit division: 0
Unit division: 0
Unit division: 0
Unit division: 0

## 2021-04-29 LAB — CBC
HCT: 22.9 % — ABNORMAL LOW (ref 36.0–46.0)
Hemoglobin: 7.8 g/dL — ABNORMAL LOW (ref 12.0–15.0)
MCH: 27.6 pg (ref 26.0–34.0)
MCHC: 34.1 g/dL (ref 30.0–36.0)
MCV: 80.9 fL (ref 80.0–100.0)
Platelets: 81 10*3/uL — ABNORMAL LOW (ref 150–400)
RBC: 2.83 MIL/uL — ABNORMAL LOW (ref 3.87–5.11)
RDW: 17.2 % — ABNORMAL HIGH (ref 11.5–15.5)
WBC: 11.6 10*3/uL — ABNORMAL HIGH (ref 4.0–10.5)
nRBC: 0 % (ref 0.0–0.2)

## 2021-04-29 LAB — BASIC METABOLIC PANEL
Anion gap: 5 (ref 5–15)
BUN: 23 mg/dL (ref 8–23)
CO2: 24 mmol/L (ref 22–32)
Calcium: 7.3 mg/dL — ABNORMAL LOW (ref 8.9–10.3)
Chloride: 102 mmol/L (ref 98–111)
Creatinine, Ser: 1.83 mg/dL — ABNORMAL HIGH (ref 0.44–1.00)
GFR, Estimated: 30 mL/min — ABNORMAL LOW (ref >60)
Glucose, Bld: 224 mg/dL — ABNORMAL HIGH (ref 70–99)
Potassium: 3.3 mmol/L — ABNORMAL LOW (ref 3.5–5.1)
Sodium: 131 mmol/L — ABNORMAL LOW (ref 135–145)

## 2021-04-29 LAB — BPAM RBC
Blood Product Expiration Date: 202207112359
Blood Product Expiration Date: 202207122359
Blood Product Expiration Date: 202207132359
Blood Product Expiration Date: 202207132359
ISSUE DATE / TIME: 202206151348
ISSUE DATE / TIME: 202206151528
Unit Type and Rh: 5100
Unit Type and Rh: 5100
Unit Type and Rh: 5100
Unit Type and Rh: 5100

## 2021-04-29 LAB — PREPARE RBC (CROSSMATCH)

## 2021-04-29 LAB — MAGNESIUM: Magnesium: 1.8 mg/dL (ref 1.7–2.4)

## 2021-04-29 LAB — GLUCOSE, CAPILLARY: Glucose-Capillary: 145 mg/dL — ABNORMAL HIGH (ref 70–99)

## 2021-04-29 SURGERY — ENTEROSCOPY
Anesthesia: General

## 2021-04-29 MED ORDER — CHLORHEXIDINE GLUCONATE CLOTH 2 % EX PADS
6.0000 | MEDICATED_PAD | Freq: Every day | CUTANEOUS | Status: DC
  Administered 2021-04-29 – 2021-05-01 (×3): 6 via TOPICAL

## 2021-04-29 MED ORDER — POTASSIUM CHLORIDE CRYS ER 20 MEQ PO TBCR
40.0000 meq | EXTENDED_RELEASE_TABLET | Freq: Once | ORAL | Status: AC
  Administered 2021-04-29: 40 meq via ORAL
  Filled 2021-04-29: qty 2

## 2021-04-29 MED ORDER — SODIUM CHLORIDE 0.9 % IV SOLN
3.0000 g | Freq: Four times a day (QID) | INTRAVENOUS | Status: DC
  Administered 2021-04-29 (×2): 3 g via INTRAVENOUS
  Filled 2021-04-29: qty 3
  Filled 2021-04-29 (×5): qty 8

## 2021-04-29 MED ORDER — CYCLOBENZAPRINE HCL 10 MG PO TABS
10.0000 mg | ORAL_TABLET | Freq: Three times a day (TID) | ORAL | Status: DC | PRN
  Administered 2021-04-29: 10 mg via ORAL
  Filled 2021-04-29: qty 1

## 2021-04-29 MED ORDER — SODIUM CHLORIDE 0.9 % IV SOLN
INTRAVENOUS | Status: DC | PRN
  Administered 2021-04-29: 250 mL via INTRAVENOUS

## 2021-04-29 NOTE — Evaluation (Signed)
Physical Therapy Evaluation Patient Details Name: Crystal [name] MRN: [id number] DOB: Jul 15, 1951 Today's Date: 04/29/2021   History of Present Illness  Per MD notes: Pt is a 70 y.o. female with the diagnosis of GI bleed and DVT s/p [facility] IVC filter placement. PMH inlcudes cirrhosis of liver, CHF, DM type 2, CAD, and CKD.  Clinical Impression  Pt was pleasant and motivated to participate during the session. Pulse oximeter unable to read pt's SPO2 and HR  secondary to cold fingers but no reports of SOB throughout session. Pt reported mild light headedness initially after standing which subsided shortly. Pt required no physical assistance during session but still limited by pain and generalized weakness for functional mobility. Pt will benefit from HHPT upon discharge to safely address deficits listed in patient problem list for decreased caregiver assistance and eventual return to PLOF.     Follow Up Recommendations Home health PT;Supervision/Assistance - 24 hour (Pt reports currently recieving HHPT services from [place] as well as nursing 2x/wk)    Equipment Recommendations  None    Recommendations for Other Services       Precautions / Restrictions Precautions Precautions: Fall Restrictions Weight Bearing Restrictions: No      Mobility  Bed Mobility Overal bed mobility: Modified Independent Bed Mobility: Supine to Sit;Sit to Supine     Supine to sit: Modified independent (Device/Increase time) Sit to supine: Modified independent (Device/Increase time)   General bed mobility comments: Pt used bed rails.    Transfers Overall transfer level: Needs assistance Equipment used: Rolling walker (2 wheeled) Transfers: Sit to/from Stand Sit to Stand: Min guard         General transfer comment: Min verbal cues for hand placement.  Ambulation/Gait Ambulation/Gait assistance: Min guard Gait Distance (Feet): 30 Feet x1, 15 Feet x1 Assistive device: Rolling walker (2  wheeled) Gait Pattern/deviations: Step-through pattern Gait velocity: decreased   General Gait Details: Walking distance limited by pain but pt steady throughout session without LOB. Very slow cadence and step length. Min verbal cues to keep walker closer.  Stairs            Wheelchair Mobility    Modified Rankin (Stroke Patients Only)       Balance Overall balance assessment: Needs assistance Sitting-balance support: Feet supported Sitting balance-Leahy Scale: Good     Standing balance support: Bilateral upper extremity supported Standing balance-Leahy Scale: Fair                               Pertinent Vitals/Pain Pain Assessment: 0-10 Pain Score: 4  Pain Location: BLE calves Pain Descriptors / Indicators: Aching;Sore;Tightness Pain Intervention(s): Monitored during session    Home Living Family/patient expects to be discharged to:: Private residence Living Arrangements: Non-relatives/Friends Available Help at Discharge: Available 24 hours/day Type of Home: House Home Access: Ramped entrance (Pt reports that ramp is safe currently but needs repaired soon.)     Home Layout: One level Home Equipment: Walker - 2 wheels;Walker - 4 wheels;Cane - single point;Bedside commode;Tub bench;Other (comment) (Borrowing friend's tub bench)      Prior Function Level of Independence: Needs assistance   Gait / Transfers Assistance Needed: Pt walks short community distances with RW or Rollator but no more than she needs to. Pt reports no falls within last 6 months but some almost falls secondary to BLE pain and swelling.  ADL's / Homemaking Assistance Needed: Pt requires assistance from friend to cook  and clean.        Hand Dominance        Extremity/Trunk Assessment   Upper Extremity Assessment Upper Extremity Assessment: Overall WFL for tasks assessed    Lower Extremity Assessment Lower Extremity Assessment: Generalized weakness        Communication   Communication: No difficulties  Cognition Arousal/Alertness: Awake/alert Behavior During Therapy: WFL for tasks assessed/performed Overall Cognitive Status: Within Functional Limits for tasks assessed                                        General Comments      Exercises Total Joint Exercises Marching in Standing: AROM;Both;10 reps;Standing Other Exercises Other Exercises: Weight shifts in standing prior to ambulation.   Assessment/Plan    PT Assessment Patient needs continued PT services  PT Problem List Decreased strength;Decreased activity tolerance;Decreased balance;Decreased mobility;Decreased knowledge of use of DME;Pain       PT Treatment Interventions Gait training;Stair training;Therapeutic exercise;Therapeutic activities;Functional mobility training;DME instruction;Neuromuscular re-education;Balance training;Patient/family education    PT Goals (Current goals can be found in the Care Plan section)  Acute Rehab PT Goals Patient Stated Goal: Walk better PT Goal Formulation: With patient Time For Goal Achievement: 05/12/21 Potential to Achieve Goals: Good    Frequency Min 2X/week   Barriers to discharge        Co-evaluation               AM-PAC PT "6 Clicks" Mobility  Outcome Measure Help needed turning from your back to your side while in a flat bed without using bedrails?: A Little Help needed moving from lying on your back to sitting on the side of a flat bed without using bedrails?: A Little Help needed moving to and from a bed to a chair (including a wheelchair)?: A Little Help needed standing up from a chair using your arms (e.g., wheelchair or bedside chair)?: A Little Help needed to walk in hospital room?: A Little Help needed climbing 3-5 steps with a railing? : A Lot 6 Click Score: 17    End of Session Equipment Utilized During Treatment: Gait belt Activity Tolerance: Patient tolerated treatment  well Patient left: in bed;with call bell/phone within reach;with bed alarm set;with nursing/sitter in room Nurse Communication: Mobility status PT Visit Diagnosis: Muscle weakness (generalized) (M62.81);Difficulty in walking, not elsewhere classified (R26.2);Pain Pain - Right/Left:  (Bilateral LE) Pain - part of body: Leg    Time: 5009-3818 PT Time Calculation (min) (ACUTE ONLY): 39 min   Charges:              Dayton Scrape SPT 04/29/21, 2:02 PM

## 2021-04-29 NOTE — Progress Notes (Signed)
[facility] [place] NAME: Crystal [name]    MR#:  [id number]  DATE OF BIRTH:  10/05/1951  SUBJECTIVE:   patient came in for black colored stools Patient is status post IVC filter placement. She recently stopped taking eliquis which was given for DVT lower extremity. Patient is status post three unit blood transfusion. S/p EGD. Denies any vomiting.  no family in the room. Patient overall is not feeling good. Started spiking fever 101.5 last night. Denies any cough. REVIEW OF SYSTEMS:   Review of Systems  Constitutional:  Positive for malaise/fatigue. Negative for chills, fever and weight loss.  HENT:  Negative for ear discharge, ear pain and nosebleeds.   Eyes:  Negative for blurred vision, pain and discharge.  Respiratory:  Negative for sputum production, shortness of breath, wheezing and stridor.   Cardiovascular:  Negative for chest pain, palpitations, orthopnea and PND.  Gastrointestinal:  Positive for melena. Negative for abdominal pain, diarrhea, nausea and vomiting.  Genitourinary:  Negative for frequency and urgency.  Musculoskeletal:  Negative for back pain and joint pain.  Neurological:  Positive for weakness. Negative for sensory change, speech change and focal weakness.  Psychiatric/Behavioral:  Negative for depression and hallucinations. The patient is not nervous/anxious.   Tolerating Diet:yes Tolerating PT:   DRUG ALLERGIES:  No Known Allergies  VITALS:  Blood pressure (!) 105/55, pulse 70, temperature 98.1 F (36.7 C), temperature source Oral, resp. rate 16, height 5' 2" (1.575 m), weight 97.5 kg, SpO2 95 %.  PHYSICAL EXAMINATION:   Physical Exam  GENERAL:  70 y.o.-year-old patient lying in the bed with no acute distress.  HEENT: Head atraumatic, normocephalic. Oropharynx and nasopharynx clear.  Pallor+ LUNGS: Normal breath sounds bilaterally, no wheezing, rales, rhonchi. No use of accessory muscles of respiration.   CARDIOVASCULAR: S1, S2 normal. No murmurs, rubs, or gallops.  ABDOMEN: Soft, nontender, nondistended. Bowel sounds present. No organomegaly or mass.  EXTREMITIES: No cyanosis, clubbing or edema b/l.    NEUROLOGIC: non-focal PSYCHIATRIC:  patient is alert and oriented x 3.  SKIN: No obvious rash, lesion, or ulcer.   LABORATORY PANEL:  CBC Recent Labs  Lab 04/29/21 0804  WBC 11.6*  HGB 7.8*  HCT 22.9*  PLT 81*     Chemistries  Recent Labs  Lab 04/27/21 0945 04/28/21 0559  NA 130* 130*  K 3.8 3.8  CL 96* 101  CO2 23 24  GLUCOSE 131* 109*  BUN 26* 23  CREATININE 1.88* 1.65*  CALCIUM 8.0* 7.4*  AST 27  --   ALT 12  --   ALKPHOS 130*  --   BILITOT 1.3*  --     Cardiac Enzymes No results for input(s): TROPONINI in the last 168 hours. RADIOLOGY:  US Abdomen Complete  Result Date: 04/29/2021 CLINICAL DATA:  Fever EXAM: ABDOMEN ULTRASOUND COMPLETE COMPARISON:  None. FINDINGS: Gallbladder: Within the gallbladder, there are echogenic foci which move and shadow consistent with cholelithiasis. Largest gallstone measures 12 mm in length. No evident gallbladder wall thickening or pericholecystic fluid. No sonographic Murphy sign noted by sonographer. Common bile duct: Diameter: The proximal common bile duct is prominent, measuring 9 mm. Portions of the extrahepatic biliary ducts obscured by gas. No intrahepatic biliary duct dilatation evident. Liver: No focal lesion identified. The liver contour is somewhat nodular. The liver echotexture is diffusely increased and rather coarse. Portal vein is patent on color Doppler imaging with normal direction of blood flow towards the liver. IVC: No  abnormality visualized. Pancreas: No pancreatic mass or inflammatory focus. Spleen: Spleen measures 14.7 x 7.5 x 15.2 cm with a measured splenic volume of 741 cubic cm. No focal splenic lesions are evident. Right Kidney: Length: 9.9 cm. Echogenicity within normal limits. No mass or hydronephrosis  visualized. Left Kidney: Length: 10.2 cm. Echogenicity within normal limits. No mass or hydronephrosis visualized. Abdominal aorta: No aneurysm visualized. Other findings: No evident ascites. IMPRESSION: 1. Cholelithiasis. No gallbladder wall thickening or pericholecystic fluid. 2. Prominence of the proximal common bile duct measuring 9 mm. No obstructing focus evident. Note that portions of the common bile duct obscured by gas. From an imaging standpoint, MRCP would be the optimum imaging study of choice to further evaluate the biliary ductal system. 3. Liver has a nodular contour with a rather coarsened diffuse echogenicity pattern, findings indicative of a degree of hepatic cirrhosis. No focal liver lesions evident. Note that the sensitivity of ultrasound for detection of focal liver lesions is diminished significantly in this circumstance. 4.  Splenomegaly.  No focal splenic lesions. 5.  Study otherwise unremarkable. Electronically Signed   By: Lowella Grip III M.D.   On: 04/29/2021 12:52   PERIPHERAL VASCULAR CATHETERIZATION  Result Date: 04/28/2021 See surgical note for result.  DG Chest Port 1 View  Result Date: 04/29/2021 CLINICAL DATA:  Fever EXAM: PORTABLE CHEST 1 VIEW COMPARISON:  December 03, 2020 FINDINGS: Port-A-Cath tip is in the superior vena cava. No pneumothorax. There is ill-defined airspace opacity in the right lower lobe. Lungs elsewhere clear. Heart size and pulmonary vascularity are normal. No adenopathy. No bone lesions. Calcification noted in the left carotid artery. IMPRESSION: Airspace opacity consistent with pneumonia right lower lobe. Lungs elsewhere clear. Heart size normal. Port-A-Cath tip in superior vena cava. Left carotid artery calcification noted. Electronically Signed   By: Lowella Grip III M.D.   On: 04/29/2021 12:53   ASSESSMENT AND PLAN:   Crystal [name] is a 70 y.o. female with medical history significant of liver cirrhosis, s/p of TIPS, hepatitis C, DM, GIB,  PUD, angiodysplasia of the small intestine, GAVE (gastric antral vascular ectasis), anemia, dCHF, CAD, CKD-IV, DVT (stopped Eliquis 1 week ago per pt), thrombocytopenia, who presents with dark stool.  GI bleeding and anemia due to blood loss: Hgb 10.0 -->5.9. Dr. Marius Ditch of GI is consulted.  - Hgb 5.9>>transfuse 2 units >> 7.4>>7.8 - IV pantoprazole gtt--changed to bid - Octreotide gtt--now d/ced - Zofran IV for nausea - Avoid NSAIDs and SQ heparin - Monitor closely and follow q6h cbc, transfuse as necessary, if Hgb<7.0 -hold aspirin -- G.I. consultation with Dr. Marius Ditch.  --6/16-- EGD  - Normal duodenal bulb and second portion of the                        duodenum.                        - A single non-bleeding angioectasia in the stomach.                        Treated with argon plasma coagulation (APC).                        - Gastric antral vascular ectasia without bleeding.                        Treated with  argon plasma coagulation (APC).                        - Normal esophagus. --6/17-- canceled small bowel enteroscopy due to fever  Fever and leukocytosis due to right lower lobe aspiration pneumonia likely due to endoscopy -- patient started spiking fever last night and today. 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  -- Mental status normal. Ammonia 45 -continue lactulose 20 g 3 times daily -Continue Xifaxan   Thrombocytopenia ([place]): Platelet 132.  Most likely due to liver cirrhosis   Chronic diastolic CHF (congestive heart failure) ([place]): 2D echo on 12/14/2020 showed EF of 55-60% with grade 1 diastolic dysfunction.  Patient has  2+ bilateral leg edema, which is likely due to DVT partially . No worsening shortness of breath.  Does not seem to have CHF acute exacerbation -resume torsemide and spironolactone    Type II diabetes mellitus with renal manifestations  (H[facility]) -SSI for now   CAD (coronary artery disease): No CP -watch closely   CKD (chronic kidney disease), stage IV ([place]): stable.  Recent baseline creatinine 1.5-2.0.  Her creatinine is 1.88, BUN 26 today. 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 Lucky Cowboy appreciated pt is s/p IVC filer placement. -- No eliquis at discharge.             DVT ppx: none (cannot use heparin/Lovenox due to GI bleeding, cannot use SCD due to DVT) Code Status: Full code Family Communication: spoke with friend [name] Disposition Plan:  Anticipate discharge back to previous environment Consults called:  Dr. Lucky Cowboy of [facility] and Dr. Marius Ditch of GI Admission status and Level of care: med-surg       Status is: Inpatient   Remains inpatient appropriate because:Inpatient level of care appropriate due to severity of illness   Dispo: The patient is from: Home              Anticipated d/c is to: Home              Patient currently is not medically stable to d/c.              Difficult to place patient No   patient developed fever with leukocytosis and aspiration pneumonia suspected during endoscopy procedure likely aspirated. Currently clearly continue IV antibiotics. G.I. plans to do small bowel enteroscopy     Level of care: Med-Surg        TOTAL TIME TAKING CARE OF THIS PATIENT: 35 minutes.  >50% time spent on counselling and coordination of care  Note: This dictation was prepared with Dragon dictation along with smaller phrase technology. Any transcriptional errors that result from this process are unintentional.  Fritzi Mandes M.D    [facility]   CC: Primary care physician; Pcp, No Patient ID: Crystal [name], female   DOB: April 30, 1951, 70 y.o.   MRN: [id number]

## 2021-04-29 NOTE — Progress Notes (Signed)
04/29/21 0404 04/29/21 0548  Assess: MEWS Score  Temp (!) 101.5 F (38.6 C) (!) 101.1 F (38.4 C)  BP (!) 108/54 (!) 109/56  Pulse Rate 94 90  Resp 20 18  SpO2 95 % 94 %  O2 Device Room Air Room Air  Assess: MEWS Score  MEWS Temp 2 1  MEWS Systolic 0 0  MEWS Pulse 0 0  MEWS RR 0 0  MEWS LOC 0 0  MEWS Score 2 1  MEWS Score Color Yellow Green  Assess: if the MEWS score is Yellow or Red  Were vital signs taken at a resting state? Yes  --   Focused Assessment No change from prior assessment  --   Early Detection of Sepsis Score *See Row Information* High  --   MEWS guidelines implemented *See Row Information* Yes  --   Treat  MEWS Interventions Administered prn meds/treatments  --   Pain Scale 0-10  --   Pain Score 0  --   Take Vital Signs  Increase Vital Sign Frequency  Yellow: Q 2hr X 2 then Q 4hr X 2, if remains yellow, continue Q 4hrs  --   Escalate  MEWS: Escalate Yellow: discuss with charge nurse/RN and consider discussing with provider and RRT  --   Notify: Charge Nurse/RN  Name of Charge Nurse/RN Notified Kaira, RN  --   Date Charge Nurse/RN Notified 04/29/21  --   Time Charge Nurse/RN Notified [phone]  --   Document  Patient Outcome  --  Stabilized after interventions

## 2021-04-29 NOTE — Consult Note (Signed)
Pharmacy Antibiotic Note  Crystal [name] is a 70 y.o. female admitted on 04/27/2021 with aspiration pneumonia (R lower lobe confirmed 6/17).    Pharmacy has been consulted for Unasyn dosing.  Plan: Will dose Unasyn 3g q6hr based on current renal function and check tomorrow for possible adjustment.  Height: 5' 2" (157.5 cm) Weight: 97.5 kg (215 lb) IBW/kg (Calculated) : 50.1  Temp (24hrs), Avg:99.3 F (37.4 C), Min:98 F (36.7 C), Max:101.5 F (38.6 C)  Recent Labs  Lab 04/27/21 0945 04/27/21 1400 04/27/21 1908 04/28/21 0113 04/28/21 0555 04/28/21 0559 04/28/21 2221 04/29/21 0804  WBC 3.8*   < > 3.8* 3.7* 4.2  --  5.8 11.6*  CREATININE 1.88*  --   --   --   --  1.65*  --   --    < > = values in this interval not displayed.    Estimated Creatinine Clearance: 35.1 mL/min (A) (by C-G formula based on SCr of 1.65 mg/dL (H)).    No Known Allergies  Antimicrobials this admission: Ceftriaxone 6/15>6/16 Unasyn 6/17 >>  Dose adjustments this admission: None  Microbiology results: 6/17 BCx: sent 6/17 UCx: sent   Thank you for allowing pharmacy to be a part of this patient's care.  Lu Duffel, PharmD, BCPS Clinical Pharmacist 04/29/2021 2:12 PM

## 2021-04-29 NOTE — Care Management Important Message (Signed)
Important Message  Patient Details  Name: FATEMAH POURCIAU MRN: [id number] Date of Birth: [birth year]   Medicare Important Message Given:  Yes     Dannette Barbara 04/29/2021, 11:03 AM

## 2021-04-29 NOTE — Progress Notes (Signed)
[name], MD [address]  [place], [place] [address]  Main: [phone]  Fax: [phone] Pager: [phone]   Subjective: Patient had severe abdominal discomfort after endoscopy yesterday associated with bloating.  That has resolved by this morning.  However, she did have fever 101.5 8:40 AM then 101.1 about 2 hours later.  She did have new onset of leukocytosis 11.6 since admission.  Her hemoglobin is stable.  She did not have further episodes of melena.  She was originally scheduled to undergo enteroscopy today and was kept n.p.o. patient denies any abdominal pain today, she does report pain in her bilateral lower extremities   Objective: Vital signs in last 24 hours: Vitals:   04/28/21 1922 04/29/21 0404 04/29/21 0548 04/29/21 0750  BP: (!) 118/50 (!) 108/54 (!) 109/56 (!) 105/52  Pulse: 60 94 90 78  Resp: _0 Temp: 98 F (36.7 C) (!) 101.5 F (38.6 C) (!) 101.1 F (38.4 C) 99.1 F (37.3 C)  TempSrc:  Oral Oral Oral  SpO2:  95% 94% 95%  Weight:      Height:       Weight change: 1.361 kg  Intake/Output Summary (Last 24 hours) at 04/29/2021 1212 Last data filed at 04/29/2021 0805 Gross per 24 hour  Intake --  Output 800 ml  Net -800 ml     Exam: Heart:: Regular rate and rhythm, S1S2 present, or without murmur or extra heart sounds Lungs: normal and clear to auscultation Abdomen: soft, nontender, normal bowel sounds   Lab Results: CBC Latest Ref Rng & Units 04/29/2021 04/28/2021 04/28/2021  WBC 4.0 - 10.5 K/uL 11.6(H) 5.8 4.2  Hemoglobin 12.0 - 15.0 g/dL 7.8(L) 7.7(L) 7.4(L)  Hematocrit 36.0 - 46.0 % 22.9(L) 23.7(L) 21.9(L)  Platelets 150 - 400 K/uL 81(L) 103(L) 109(L)   CMP Latest Ref Rng & Units 04/28/2021 04/27/2021 04/07/2021  Glucose 70 - 99 mg/dL 109(H) 131(H) 165(H)  BUN 8 - 23 mg/dL 23 26(H) 21  Creatinine 0.44 - 1.00 mg/dL 1.65(H) 1.88(H) 1.75(H)  Sodium 135 - 145 mmol/L 130(L) 130(L) 137  Potassium 3.5 - 5.1 mmol/L  3.8 3.8 4.0  Chloride 98 - 111 mmol/L 101 96(L) 106  CO2 22 - 32 mmol/L _1 Calcium 8.9 - 10.3 mg/dL 7.4(L) 8.0(L) 8.1(L)  Total Protein 6.5 - 8.1 g/dL - 5.5(L) -  Total Bilirubin 0.3 - 1.2 mg/dL - 1.3(H) -  Alkaline Phos 38 - 126 U/L - 130(H) -  AST 15 - 41 U/L - 27 -  ALT 0 - 44 U/L - 12 -    Micro Results: Recent Results (from the past 240 hour(s))  Resp Panel by RT-PCR (Flu A&B, Covid) Nasopharyngeal Swab     Status: None   Collection Time: 04/27/21 12:35 PM   Specimen: Nasopharyngeal Swab; Nasopharyngeal(NP) swabs in vial transport medium  Result Value Ref Range Status   SARS Coronavirus 2 by RT PCR NEGATIVE NEGATIVE Final    Comment: (NOTE) SARS-CoV-2 target nucleic acids are NOT DETECTED.  The SARS-CoV-2 RNA is generally detectable in upper respiratory specimens during the acute phase of infection. The lowest concentration of SARS-CoV-2 viral copies this assay can detect is 138 copies/mL. A negative result does not preclude SARS-Cov-2 infection and should not be used as the sole basis for treatment or other patient management decisions. A negative result may occur with  improper specimen collection/handling, submission of specimen other than nasopharyngeal swab, presence of viral mutation(s) within  the areas targeted by this assay, and inadequate number of viral copies(<138 copies/mL). A negative result must be combined with clinical observations, patient history, and epidemiological information. The expected result is Negative.  Fact Sheet for Patients:  EntrepreneurPulse.com.au  Fact Sheet for Healthcare Providers:  IncredibleEmployment.be  This test is no t yet approved or cleared by the [place] FDA and  has been authorized for detection and/or diagnosis of SARS-CoV-2 by FDA under an Emergency Use Authorization (EUA). This EUA will remain  in effect (meaning this test can be used) for the duration of the COVID-19  declaration under Section 564(b)(1) of the Act, 21 U.S.C.section 360bbb-3(b)(1), unless the authorization is terminated  or revoked sooner.       Influenza A by PCR NEGATIVE NEGATIVE Final   Influenza B by PCR NEGATIVE NEGATIVE Final    Comment: (NOTE) The Xpert Xpress SARS-CoV-2/FLU/RSV plus assay is intended as an aid in the diagnosis of influenza from Nasopharyngeal swab specimens and should not be used as a sole basis for treatment. Nasal washings and aspirates are unacceptable for Xpert Xpress SARS-CoV-2/FLU/RSV testing.  Fact Sheet for Patients: EntrepreneurPulse.com.au  Fact Sheet for Healthcare Providers: IncredibleEmployment.be  This test is not yet approved or cleared by the [place] FDA and has been authorized for detection and/or diagnosis of SARS-CoV-2 by FDA under an Emergency Use Authorization (EUA). This EUA will remain in effect (meaning this test can be used) for the duration of the COVID-19 declaration under Section 564(b)(1) of the Act, 21 U.S.C. section 360bbb-3(b)(1), unless the authorization is terminated or revoked.  Performed at [facility], [address] [place], [place] [address]    Studies/Results: PERIPHERAL VASCULAR CATHETERIZATION  Result Date: 04/28/2021 See surgical note for result.  Medications: I have reviewed the patient's current medications. Prior to Admission:  Medications Prior to Admission  Medication Sig Dispense Refill Last Dose   hydrOXYzine (ATARAX/VISTARIL) 25 MG tablet Take 1 tablet (25 mg total) by mouth 2 (two) times daily as needed. 30 tablet 0 04/26/2021 at NIGHT   lactulose (CHRONULAC) 10 GM/15ML solution Take 30 mLs (20 g total) by mouth 3 (three) times daily. 1800 mL 2 04/26/2021 at PM   pantoprazole (PROTONIX) 40 MG tablet Take 1 tablet (40 mg total) by mouth daily. 90 tablet 3 04/26/2021 at AM   potassium chloride (KLOR-CON) 10 MEQ tablet Take 10 mEq by mouth daily.       POTASSIUM CHLORIDE PO Take 15 mEq by mouth daily.   04/26/2021 at AM   sodium bicarbonate 650 MG tablet Take 1,300 mg by mouth 2 (two) times daily.   04/26/2021 at NIGHT   spironolactone (ALDACTONE) 100 MG tablet TAKE 1 TABLET BY MOUTH EVERY DAY 30 tablet 2 04/26/2021 at AM   torsemide (DEMADEX) 20 MG tablet TAKE 2 TABLETS BY MOUTH 2 TIMES DAILY. 120 tablet 2 04/26/2021 at NIGHT   XIFAXAN 550 MG TABS tablet Take 550 mg by mouth 2 (two) times daily.   04/26/2021 at NIGHT   acetaminophen (TYLENOL) 500 MG tablet Take 500 mg by mouth daily as needed for moderate pain or headache. (Patient not taking: No sig reported)      apixaban (ELIQUIS) 5 MG TABS tablet Take 65m po bid for 7 days then 534mpo bid (Patient not taking: No sig reported) 60 tablet 1 Not Taking   aspirin-sod bicarb-citric acid (ALKA-SELTZER) 325 MG TBEF tablet Take 650 mg by mouth every 6 (six) hours as needed (indigestion).   PRN  benzonatate (TESSALON) 100 MG capsule Take 100 mg by mouth 4 (four) times daily as needed. (Patient not taking: No sig reported)   Completed Course   cyanocobalamin (,VITAMIN B-12,) 1000 MCG/ML injection Inject 1,000 mcg into the muscle every 30 (thirty) days.       Misc. Devices KIT Moderate compression hose 20-30 MM HG 1 kit 0    ondansetron (ZOFRAN) 4 MG tablet Take 4 mg by mouth every 8 (eight) hours as needed for nausea or vomiting.    PRN   OneTouch Delica Lancets 16X MISC 2 (two) times daily.      ONETOUCH ULTRA test strip 2 (two) times daily.      oxyCODONE (OXY IR/ROXICODONE) 5 MG immediate release tablet Take 1 tablet (5 mg total) by mouth 2 (two) times daily as needed for moderate pain. (Patient not taking: No sig reported) 10 tablet 0 Not Taking   promethazine-dextromethorphan (PROMETHAZINE-DM) 6.25-15 MG/5ML syrup Take 5 mLs by mouth as needed. (Patient not taking: No sig reported)   Not Taking   Tetrahydrozoline HCl (VISINE OP) Place 1 drop into both eyes daily as needed (irritaiton).   PRN    Vitamin D, Ergocalciferol, (DRISDOL) 1.25 MG (50000 UNIT) CAPS capsule Take 50,000 Units by mouth every _0 /15/22 1700  cefTRIAXone (ROCEPHIN) 2 g in sodium chloride 0.9 % 100 mL IVPB        2 g 200 mL/hr over 30 Minutes Intravenous Every 24 hours 04/27/21 1653        Scheduled Meds:  lactulose  20 g Oral TID   [START ON 05/01/2021] pantoprazole  40 mg Intravenous Q12H   rifaximin  550 mg Oral BID   sodium bicarbonate  1,300 mg Oral BID   spironolactone  100 mg Oral Daily   torsemide  40 mg Oral BID   Continuous Infusions:  cefTRIAXone (ROCEPHIN)  IV 2 g (04/28/21 1814)   PRN Meds:.acetaminophen, hydrOXYzine, ondansetron (ZOFRAN) IV, oxyCODONE, tetrahydrozoline   Assessment: Principal Problem:   GI bleeding Active Problems:   Cirrhosis of 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  Patient received blood transfusion responded appropriately, no further episodes of melena, hemoglobin has been stable Okay to discontinue pantoprazole and octreotide drips Continue Protonix 40 mg twice daily Will cancel enteroscopy today given new onset of fever and leukocytosis pending work-up for infection. Okay to resume 2 g sodium diet  Fever with leukocytosis Ordered ultrasound abdomen to evaluate for ascites.  If there is ascites, recommend diagnostic and therapeutic paracentesis with fluid analysis Continue ceftriaxone for SBP prophylaxis Urine culture and blood cultures have been ordered Ordered chest x-ray  Bilateral swelling of legs most likely secondary to acute on chronic DVT, s/p IVC filter placement on 6/16 Continue torsemide and spironolactone  Decompensated cirrhosis of liver Continue lactulose and rifaximin for hepatic encephalopathy  GI will follow along with you    LOS: 2 days   Crystal [name] 04/29/2021, 12:12 PM

## 2021-04-30 LAB — CBC WITH DIFFERENTIAL/PLATELET
Abs Immature Granulocytes: 0.02 10*3/uL (ref 0.00–0.07)
Basophils Absolute: 0 10*3/uL (ref 0.0–0.1)
Basophils Relative: 0 %
Eosinophils Absolute: 0.1 10*3/uL (ref 0.0–0.5)
Eosinophils Relative: 2 %
HCT: 22.9 % — ABNORMAL LOW (ref 36.0–46.0)
Hemoglobin: 7.4 g/dL — ABNORMAL LOW (ref 12.0–15.0)
Immature Granulocytes: 0 %
Lymphocytes Relative: 14 %
Lymphs Abs: 1 10*3/uL (ref 0.7–4.0)
MCH: 27.2 pg (ref 26.0–34.0)
MCHC: 32.3 g/dL (ref 30.0–36.0)
MCV: 84.2 fL (ref 80.0–100.0)
Monocytes Absolute: 0.8 10*3/uL (ref 0.1–1.0)
Monocytes Relative: 11 %
Neutro Abs: 5.2 10*3/uL (ref 1.7–7.7)
Neutrophils Relative %: 73 %
Platelets: 82 10*3/uL — ABNORMAL LOW (ref 150–400)
RBC: 2.72 MIL/uL — ABNORMAL LOW (ref 3.87–5.11)
RDW: 17.9 % — ABNORMAL HIGH (ref 11.5–15.5)
Smear Review: DECREASED
WBC: 7.1 10*3/uL (ref 4.0–10.5)
nRBC: 0 % (ref 0.0–0.2)

## 2021-04-30 LAB — BASIC METABOLIC PANEL
Anion gap: 3 — ABNORMAL LOW (ref 5–15)
BUN: 22 mg/dL (ref 8–23)
CO2: 26 mmol/L (ref 22–32)
Calcium: 7.4 mg/dL — ABNORMAL LOW (ref 8.9–10.3)
Chloride: 102 mmol/L (ref 98–111)
Creatinine, Ser: 1.71 mg/dL — ABNORMAL HIGH (ref 0.44–1.00)
GFR, Estimated: 32 mL/min — ABNORMAL LOW (ref >60)
Glucose, Bld: 144 mg/dL — ABNORMAL HIGH (ref 70–99)
Potassium: 3.7 mmol/L (ref 3.5–5.1)
Sodium: 131 mmol/L — ABNORMAL LOW (ref 135–145)

## 2021-04-30 LAB — GLUCOSE, CAPILLARY: Glucose-Capillary: 105 mg/dL — ABNORMAL HIGH (ref 70–99)

## 2021-04-30 MED ORDER — GABAPENTIN 100 MG PO CAPS
200.0000 mg | ORAL_CAPSULE | Freq: Two times a day (BID) | ORAL | Status: DC
  Administered 2021-04-30 – 2021-05-01 (×3): 200 mg via ORAL
  Filled 2021-04-30 (×2): qty 2

## 2021-04-30 MED ORDER — METRONIDAZOLE 500 MG/100ML IV SOLN
500.0000 mg | Freq: Three times a day (TID) | INTRAVENOUS | Status: DC
  Administered 2021-04-30 – 2021-05-01 (×3): 500 mg via INTRAVENOUS
  Filled 2021-04-30 (×7): qty 100

## 2021-04-30 MED ORDER — SODIUM CHLORIDE 0.9 % IV SOLN
2.0000 g | INTRAVENOUS | Status: DC
  Administered 2021-04-30: 2 g via INTRAVENOUS
  Filled 2021-04-30: qty 2
  Filled 2021-04-30 (×2): qty 20

## 2021-04-30 MED ORDER — SODIUM CHLORIDE 0.9 % IV SOLN
300.0000 mg | Freq: Once | INTRAVENOUS | Status: AC
  Administered 2021-04-30: 300 mg via INTRAVENOUS
  Filled 2021-04-30: qty 15

## 2021-04-30 NOTE — Progress Notes (Signed)
PROGRESS NOTE    MEEKA CARTELLI  YKD:[id number] DOB: 24-May-1951 DOA: 04/27/2021 PCP: Pcp, No    Brief Narrative:  Avynn A Kubicki is a 70 y.o. female with medical history significant of liver cirrhosis, s/p of TIPS, hepatitis C, DM, GIB, PUD, angiodysplasia of the small intestine, GAVE (gastric antral vascular ectasis), anemia, dCHF, CAD, CKD-IV, DVT (stopped Eliquis 1 week ago per pt), thrombocytopenia, who presents with dark stool.  GI consultation obtained.  Status post EGD on 6/16.  Single nonbleeding angiectasia in the stomach treated with APC.  Hemoglobin is been stable afterwards.  Initial plan was for small bowel enteroscopy however this was canceled due to new onset fever  Fever secondary to presumed aspiration pneumonia post endoscopy.  Started on aspiration precautions and antibiotics.   Assessment & Plan:   Principal Problem:   GI bleeding Active Problems:   Cirrhosis of liver without ascites (HCC)   S/P TIPS (transjugular intrahepatic portosystemic shunt)   Liver cirrhosis secondary to NASH (HCC)   Thrombocytopenia (HCC)   Chronic diastolic CHF (congestive heart failure) (HCC)   Type II diabetes mellitus with renal manifestations (HCC)   CAD (coronary artery disease)   CKD (chronic kidney disease), stage IV (HCC)   DVT (deep venous thrombosis) (HCC)   Acute blood loss anemia  GI bleeding and anemia due to blood loss: Hgb 10.0 -->5.9. Dr. Marius Ditch of GI is consulted.  - Hgb 5.9>>transfuse 2 units >> 7.4>>7.8 - IV pantoprazole gtt--changed to bid - Octreotide gtt--now d/ced - Zofran IV for nausea - Avoid NSAIDs and SQ heparin - Monitor closely and follow q6h cbc, transfuse as necessary, if Hgb<7.0 -hold aspirin -- G.I. consultation with Dr. Marius Ditch.  --6/16-- EGD  - Normal duodenal bulb and second portion of the                        duodenum.                        - A single non-bleeding angioectasia in the stomach.                        Treated with argon plasma  coagulation (APC).                        - Gastric antral vascular ectasia without bleeding.                        Treated with argon plasma coagulation (APC).                        - Normal esophagus. --6/17-- canceled small bowel enteroscopy due to fever   Fever and leukocytosis due to right lower lobe aspiration pneumonia  likely due to endoscopy Patient developed fevers day after endoscopy Initially started on IV Unasyn Patient could not tolerate due to severe hand cramps Unclear whether this represents a true allergy Plan: DC Unasyn due to intolerance Start Rocephin and metronidazole for aspiration pneumonia coverage   Cirrhosis of liver without ascites and liver cirrhosis secondary to NASH  S/P TIPS (transjugular intrahepatic portosystemic shunt).  -- Mental status normal. Ammonia 45 -continue lactulose 20 g 3 times daily -Continue Xifaxan   Thrombocytopenia (HCC) Low but stable.  Presumably due to liver cirrhosis   Chronic diastolic CHF (congestive heart failure) ([place]) 2D echo on  12/14/2020 showed EF of 55-60% with grade 1 diastolic dysfunction.  Patient has  2+ bilateral leg edema, which is likely due to DVT partially . No worsening shortness of breath.  Does not seem to have CHF acute exacerbation -resume torsemide and spironolactone   Type II diabetes mellitus with renal manifestations (HCC) -SSI for now   CAD (coronary artery disease): No CP -watch closely   CKD (chronic kidney disease), stage IV (HCC)  stable.  Recent baseline creatinine 1.5-2.0.  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 Lucky Cowboy appreciated pt is s/p IVC filer placement. -- No eliquis at discharge.   DVT prophylaxis: None.  Chemoprophylaxis contraindicated due to GI bleed.  SCD not tolerated due to DVTs Code Status: Full Family  Communication: None Disposition Plan: Status is: Inpatient  Remains inpatient appropriate because:Inpatient level of care appropriate due to severity of illness  Dispo: The patient is from: Home              Anticipated d/c is to: Home              Patient currently is not medically stable to d/c.   Difficult to place patient No       Level of care: Med-Surg  Consultants:  GI  Procedures: (Don't include imaging studies which can be auto populated. Include things that cannot be auto populated i.e. Echo, Carotid and venous dopplers, Foley, Bipap, HD, tubes/drains, wound vac, central lines etc) EGD  Antimicrobials:  Ceftriaxone Metronidazole   Subjective: Seen and examined.  Endorses pain in bilateral legs.  Breathing improved.  No other issues  Objective: Vitals:   04/29/21 2321 04/30/21 0454 04/30/21 0746 04/30/21 1214  BP: (!) 112/53 (!) 106/57 116/73 (!) 107/57  Pulse: 68 62 64 62  Resp: _0 Temp: 97.8 F (36.6 C) 98.7 F (37.1 C) 97.8 F (36.6 C) 98.6 F (37 C)  TempSrc: Oral Oral Oral Oral  SpO2: 95% 94% 96% 94%  Weight:      Height:        Intake/Output Summary (Last 24 hours) at 04/30/2021 1234 Last data filed at 04/30/2021 1001 Gross per 24 hour  Intake 1743.36 ml  Output 2850 ml  Net -1106.64 ml   Filed Weights   04/27/21 0939 04/28/21 0315 04/28/21 0939  Weight: 96.2 kg 96.1 kg 97.5 kg    Examination:  General exam: No acute distress.  Appears frail Respiratory system: Bibasilar crackles.  Normal work of breathing.  Room air Cardiovascular system: S1 & S2 heard, RRR. No JVD, murmurs, rubs, gallops or clicks. No pedal edema. Gastrointestinal system: Abdomen is nondistended, soft and nontender. No organomegaly or masses felt. Normal bowel sounds heard. Central nervous system: Alert and oriented. No focal neurological deficits. Extremities: Symmetric 5 x 5 power. Skin: No rashes, lesions or ulcers Psychiatry: Judgement and insight  appear normal. Mood & affect appropriate.     Data Reviewed: I have personally reviewed following labs and imaging studies  CBC: Recent Labs  Lab 04/28/21 0113 04/28/21 0555 04/28/21 2221 04/29/21 0804 04/30/21 0500  WBC 3.7* 4.2 5.8 11.6* 7.1  NEUTROABS  --   --   --   --  5.2  HGB 7.0* 7.4* 7.7* 7.8* 7.4*  HCT 21.1* 21.9* 23.7* 22.9* 22.9*  MCV 82.1 81.4 83.2 80.9 84.2  PLT 104* 109* 103* 81* 82*   Basic Metabolic Panel: Recent Labs  Lab 04/27/21 0945 04/28/21 0559 04/29/21 1959 04/30/21 0500  NA 130* 130* 131* 131*  K 3.8 3.8 3.3* 3.7  CL 96* 101 102 102  CO2 _0 GLUCOSE 131* 109* 224* 144*  BUN 26* _1 CREATININE 1.88* 1.65* 1.83* 1.71*  CALCIUM 8.0* 7.4* 7.3* 7.4*  MG  --   --  1.8  --    GFR: Estimated Creatinine Clearance: 33.9 mL/min (A) (by C-G formula based on SCr of 1.71 mg/dL (H)). Liver Function Tests: Recent Labs  Lab 04/27/21 0945  AST 27  ALT 12  ALKPHOS 130*  BILITOT 1.3*  PROT 5.5*  ALBUMIN 2.1*   No results for input(s): LIPASE, AMYLASE in the last 168 hours. Recent Labs  Lab 04/27/21 1400  AMMONIA 19   Coagulation Profile: Recent Labs  Lab 04/27/21 1400  INR 1.3*   Cardiac Enzymes: No results for input(s): CKTOTAL, CKMB, CKMBINDEX, TROPONINI in the last 168 hours. BNP (last 3 results) No results for input(s): PROBNP in the last 8760 hours. HbA1C: No results for input(s): HGBA1C in the last 72 hours. CBG: Recent Labs  Lab 04/28/21 1131 04/28/21 1448 04/28/21 1655 04/29/21 0755 04/30/21 0750  GLUCAP 84 87 74 145* 105*   Lipid Profile: No results for input(s): CHOL, HDL, LDLCALC, TRIG, CHOLHDL, LDLDIRECT in the last 72 hours. Thyroid Function Tests: No results for input(s): TSH, T4TOTAL, FREET4, T3FREE, THYROIDAB in the last 72 hours. Anemia Panel: No results for input(s): VITAMINB12, FOLATE, FERRITIN, TIBC, IRON, RETICCTPCT in the last 72 hours. Sepsis Labs: No results for input(s): PROCALCITON,  LATICACIDVEN in the last 168 hours.  Recent Results (from the past 240 hour(s))  Resp Panel by RT-PCR (Flu A&B, Covid) Nasopharyngeal Swab     Status: None   Collection Time: 04/27/21 12:35 PM   Specimen: Nasopharyngeal Swab; Nasopharyngeal(NP) swabs in vial transport medium  Result Value Ref Range Status   SARS Coronavirus 2 by RT PCR NEGATIVE NEGATIVE Final    Comment: (NOTE) SARS-CoV-2 target nucleic acids are NOT DETECTED.  The SARS-CoV-2 RNA is generally detectable in upper respiratory specimens during the acute phase of infection. The lowest concentration of SARS-CoV-2 viral copies this assay can detect is 138 copies/mL. A negative result does not preclude SARS-Cov-2 infection and should not be used as the sole basis for treatment or other patient management decisions. A negative result may occur with  improper specimen collection/handling, submission of specimen other than nasopharyngeal swab, presence of viral mutation(s) within the areas targeted by this assay, and inadequate number of viral copies(<138 copies/mL). A negative result must be combined with clinical observations, patient history, and epidemiological information. The expected result is Negative.  Fact Sheet for Patients:  EntrepreneurPulse.com.au  Fact Sheet for Healthcare Providers:  IncredibleEmployment.be  This test is no t yet approved or cleared by the [place] FDA and  has been authorized for detection and/or diagnosis of SARS-CoV-2 by FDA under an Emergency Use Authorization (EUA). This EUA will remain  in effect (meaning this test can be used) for the duration of the COVID-19 declaration under Section 564(b)(1) of the Act, 21 U.S.C.section 360bbb-3(b)(1), unless the authorization is terminated  or revoked sooner.       Influenza A by PCR NEGATIVE NEGATIVE Final   Influenza B by PCR NEGATIVE NEGATIVE Final    Comment: (NOTE) The Xpert Xpress  SARS-CoV-2/FLU/RSV plus assay is intended as an  aid in the diagnosis of influenza from Nasopharyngeal swab specimens and should not be used as a sole basis for treatment. Nasal washings and aspirates are unacceptable for Xpert Xpress SARS-CoV-2/FLU/RSV testing.  Fact Sheet for Patients: EntrepreneurPulse.com.au  Fact Sheet for Healthcare Providers: IncredibleEmployment.be  This test is not yet approved or cleared by the [place] FDA and has been authorized for detection and/or diagnosis of SARS-CoV-2 by FDA under an Emergency Use Authorization (EUA). This EUA will remain in effect (meaning this test can be used) for the duration of the COVID-19 declaration under Section 564(b)(1) of the Act, 21 U.S.C. section 360bbb-3(b)(1), unless the authorization is terminated or revoked.  Performed at [facility], [address] [place], [place] [address]   Culture, blood (single) w Reflex to ID Panel     Status: None (Preliminary result)   Collection Time: 04/29/21  3:05 PM   Specimen: BLOOD  Result Value Ref Range Status   Specimen Description BLOOD BLOOD LEFT FOREARM  Final   Special Requests   Final    BOTTLES DRAWN AEROBIC AND ANAEROBIC Blood Culture adequate volume   Culture   Final    NO GROWTH < 24 HOURS Performed at [facility], [address], [place], [place] [address]    Report Status PENDING  Incomplete         Radiology Studies: US Abdomen Complete  Result Date: 04/29/2021 CLINICAL DATA:  Fever EXAM: ABDOMEN ULTRASOUND COMPLETE COMPARISON:  None. FINDINGS: Gallbladder: Within the gallbladder, there are echogenic foci which move and shadow consistent with cholelithiasis. Largest gallstone measures 12 mm in length. No evident gallbladder wall thickening or pericholecystic fluid. No sonographic Murphy sign noted by sonographer. Common bile duct: Diameter: The proximal common bile duct is prominent, measuring 9  mm. Portions of the extrahepatic biliary ducts obscured by gas. No intrahepatic biliary duct dilatation evident. Liver: No focal lesion identified. The liver contour is somewhat nodular. The liver echotexture is diffusely increased and rather coarse. Portal vein is patent on color Doppler imaging with normal direction of blood flow towards the liver. IVC: No abnormality visualized. Pancreas: No pancreatic mass or inflammatory focus. Spleen: Spleen measures 14.7 x 7.5 x 15.2 cm with a measured splenic volume of 741 cubic cm. No focal splenic lesions are evident. Right Kidney: Length: 9.9 cm. Echogenicity within normal limits. No mass or hydronephrosis visualized. Left Kidney: Length: 10.2 cm. Echogenicity within normal limits. No mass or hydronephrosis visualized. Abdominal aorta: No aneurysm visualized. Other findings: No evident ascites. IMPRESSION: 1. Cholelithiasis. No gallbladder wall thickening or pericholecystic fluid. 2. Prominence of the proximal common bile duct measuring 9 mm. No obstructing focus evident. Note that portions of the common bile duct obscured by gas. From an imaging standpoint, MRCP would be the optimum imaging study of choice to further evaluate the biliary ductal system. 3. Liver has a nodular contour with a rather coarsened diffuse echogenicity pattern, findings indicative of a degree of hepatic cirrhosis. No focal liver lesions evident. Note that the sensitivity of ultrasound for detection of focal liver lesions is diminished significantly in this circumstance. 4.  Splenomegaly.  No focal splenic lesions. 5.  Study otherwise unremarkable. Electronically Signed   By: Lowella Grip III M.D.   On: 04/29/2021 12:52   DG Chest Port 1 View  Result Date: 04/29/2021 CLINICAL DATA:  Fever EXAM: PORTABLE CHEST 1 VIEW COMPARISON:  December 03, 2020 FINDINGS: Port-A-Cath tip is in the superior vena cava. No pneumothorax. There is ill-defined airspace opacity  in the right lower lobe. Lungs  elsewhere clear. Heart size and pulmonary vascularity are normal. No adenopathy. No bone lesions. Calcification noted in the left carotid artery. IMPRESSION: Airspace opacity consistent with pneumonia right lower lobe. Lungs elsewhere clear. Heart size normal. Port-A-Cath tip in superior vena cava. Left carotid artery calcification noted. Electronically Signed   By: Lowella Grip III M.D.   On: 04/29/2021 12:53        Scheduled Meds:  Chlorhexidine Gluconate Cloth  6 each Topical Daily   lactulose  20 g Oral TID   [START ON 05/01/2021] pantoprazole  40 mg Intravenous Q12H   rifaximin  550 mg Oral BID   sodium bicarbonate  1,300 mg Oral BID   spironolactone  100 mg Oral Daily   torsemide  40 mg Oral BID   Continuous Infusions:  sodium chloride Stopped (04/29/21 2120)   cefTRIAXone (ROCEPHIN)  IV     metronidazole       LOS: 3 days    Time spent: 25 minutes    Sidney Ace, MD [facility] Pager [phone]  If 7PM-7AM, please contact night-coverage 04/30/2021, 12:34 PM

## 2021-04-30 NOTE — Progress Notes (Signed)
Crystal Darby, MD [address]  [place]  [place], [place] [address]  Main: [phone]  Fax: [phone] Pager: [phone]   Subjective: Patient developed aspiration pneumonia after EGD.  She has been afebrile in last 24 hours, has been hemodynamically stable, denies any respiratory distress, oxygenating well on room air.  She reports having a bowel movement overnight which was brown in color.  She is tolerating p.o. well.  She said she developed cramps in her hand while receiving Unasyn for pneumonia   Objective: Vital signs in last 24 hours: Vitals:   04/29/21 2321 04/30/21 0454 04/30/21 0746 04/30/21 1214  BP: (!) 112/53 (!) 106/57 116/73 (!) 107/57  Pulse: 68 62 64 62  Resp: _0 Temp: 97.8 F (36.6 C) 98.7 F (37.1 C) 97.8 F (36.6 C) 98.6 F (37 C)  TempSrc: Oral Oral Oral Oral  SpO2: 95% 94% 96% 94%  Weight:      Height:       Weight change:   Intake/Output Summary (Last 24 hours) at 04/30/2021 1433 Last data filed at 04/30/2021 1300 Gross per 24 hour  Intake 1983.36 ml  Output 2850 ml  Net -866.64 ml     Exam: Heart:: Regular rate and rhythm, S1S2 present, or without murmur or extra heart sounds Lungs: normal and clear to auscultation Abdomen: soft, nontender, normal bowel sounds   Lab Results: CBC Latest Ref Rng & Units 04/30/2021 04/29/2021 04/28/2021  WBC 4.0 - 10.5 K/uL 7.1 11.6(H) 5.8  Hemoglobin 12.0 - 15.0 g/dL 7.4(L) 7.8(L) 7.7(L)  Hematocrit 36.0 - 46.0 % 22.9(L) 22.9(L) 23.7(L)  Platelets 150 - 400 K/uL 82(L) 81(L) 103(L)   CMP Latest Ref Rng & Units 04/30/2021 04/29/2021 04/28/2021  Glucose 70 - 99 mg/dL 144(H) 224(H) 109(H)  BUN 8 - 23 mg/dL _1 Creatinine 0.44 - 1.00 mg/dL 1.71(H) 1.83(H) 1.65(H)  Sodium 135 - 145 mmol/L 131(L) 131(L) 130(L)  Potassium 3.5 - 5.1 mmol/L 3.7 3.3(L) 3.8  Chloride 98 - 111 mmol/L 102 102 101  CO2 22 - 32 mmol/L _2 Calcium 8.9 - 10.3 mg/dL 7.4(L) 7.3(L) 7.4(L)  Total Protein  6.5 - 8.1 g/dL - - -  Total Bilirubin 0.3 - 1.2 mg/dL - - -  Alkaline Phos 38 - 126 U/L - - -  AST 15 - 41 U/L - - -  ALT 0 - 44 U/L - - -    Micro Results: Recent Results (from the past 240 hour(s))  Resp Panel by RT-PCR (Flu A&B, Covid) Nasopharyngeal Swab     Status: None   Collection Time: 04/27/21 12:35 PM   Specimen: Nasopharyngeal Swab; Nasopharyngeal(NP) swabs in vial transport medium  Result Value Ref Range Status   SARS Coronavirus 2 by RT PCR NEGATIVE NEGATIVE Final    Comment: (NOTE) SARS-CoV-2 target nucleic acids are NOT DETECTED.  The SARS-CoV-2 RNA is generally detectable in upper respiratory specimens during the acute phase of infection. The lowest concentration of SARS-CoV-2 viral copies this assay can detect is 138 copies/mL. A negative result does not preclude SARS-Cov-2 infection and should not be used as the sole basis for treatment or other patient management decisions. A negative result may occur with  improper specimen collection/handling, submission of specimen other than nasopharyngeal swab, presence of viral mutation(s) within the areas targeted by this assay, and inadequate number of viral copies(<138 copies/mL). A negative result must be combined with clinical observations, patient history, and epidemiological information. The  expected result is Negative.  Fact Sheet for Patients:  EntrepreneurPulse.com.au  Fact Sheet for Healthcare Providers:  IncredibleEmployment.be  This test is no t yet approved or cleared by the [place] FDA and  has been authorized for detection and/or diagnosis of SARS-CoV-2 by FDA under an Emergency Use Authorization (EUA). This EUA will remain  in effect (meaning this test can be used) for the duration of the COVID-19 declaration under Section 564(b)(1) of the Act, 21 U.S.C.section 360bbb-3(b)(1), unless the authorization is terminated  or revoked sooner.       Influenza A by  PCR NEGATIVE NEGATIVE Final   Influenza B by PCR NEGATIVE NEGATIVE Final    Comment: (NOTE) The Xpert Xpress SARS-CoV-2/FLU/RSV plus assay is intended as an aid in the diagnosis of influenza from Nasopharyngeal swab specimens and should not be used as a sole basis for treatment. Nasal washings and aspirates are unacceptable for Xpert Xpress SARS-CoV-2/FLU/RSV testing.  Fact Sheet for Patients: EntrepreneurPulse.com.au  Fact Sheet for Healthcare Providers: IncredibleEmployment.be  This test is not yet approved or cleared by the [place] FDA and has been authorized for detection and/or diagnosis of SARS-CoV-2 by FDA under an Emergency Use Authorization (EUA). This EUA will remain in effect (meaning this test can be used) for the duration of the COVID-19 declaration under Section 564(b)(1) of the Act, 21 U.S.C. section 360bbb-3(b)(1), unless the authorization is terminated or revoked.  Performed at [facility], [address] [place], [place] [address]   Culture, blood (single) w Reflex to ID Panel     Status: None (Preliminary result)   Collection Time: 04/29/21  3:05 PM   Specimen: BLOOD  Result Value Ref Range Status   Specimen Description BLOOD BLOOD LEFT FOREARM  Final   Special Requests   Final    BOTTLES DRAWN AEROBIC AND ANAEROBIC Blood Culture adequate volume   Culture   Final    NO GROWTH < 24 HOURS Performed at [facility], [address], [place], [place] [address]    Report Status PENDING  Incomplete   Studies/Results: US Abdomen Complete  Result Date: 04/29/2021 CLINICAL DATA:  Fever EXAM: ABDOMEN ULTRASOUND COMPLETE COMPARISON:  None. FINDINGS: Gallbladder: Within the gallbladder, there are echogenic foci which move and shadow consistent with cholelithiasis. Largest gallstone measures 12 mm in length. No evident gallbladder wall thickening or pericholecystic fluid. No sonographic Murphy sign noted by  sonographer. Common bile duct: Diameter: The proximal common bile duct is prominent, measuring 9 mm. Portions of the extrahepatic biliary ducts obscured by gas. No intrahepatic biliary duct dilatation evident. Liver: No focal lesion identified. The liver contour is somewhat nodular. The liver echotexture is diffusely increased and rather coarse. Portal vein is patent on color Doppler imaging with normal direction of blood flow towards the liver. IVC: No abnormality visualized. Pancreas: No pancreatic mass or inflammatory focus. Spleen: Spleen measures 14.7 x 7.5 x 15.2 cm with a measured splenic volume of 741 cubic cm. No focal splenic lesions are evident. Right Kidney: Length: 9.9 cm. Echogenicity within normal limits. No mass or hydronephrosis visualized. Left Kidney: Length: 10.2 cm. Echogenicity within normal limits. No mass or hydronephrosis visualized. Abdominal aorta: No aneurysm visualized. Other findings: No evident ascites. IMPRESSION: 1. Cholelithiasis. No gallbladder wall thickening or pericholecystic fluid. 2. Prominence of the proximal common bile duct measuring 9 mm. No obstructing focus evident. Note that portions of the common bile duct obscured by gas. From an imaging standpoint, MRCP would be the optimum imaging study  of choice to further evaluate the biliary ductal system. 3. Liver has a nodular contour with a rather coarsened diffuse echogenicity pattern, findings indicative of a degree of hepatic cirrhosis. No focal liver lesions evident. Note that the sensitivity of ultrasound for detection of focal liver lesions is diminished significantly in this circumstance. 4.  Splenomegaly.  No focal splenic lesions. 5.  Study otherwise unremarkable. Electronically Signed   By: Lowella Grip III M.D.   On: 04/29/2021 12:52   DG Chest Port 1 View  Result Date: 04/29/2021 CLINICAL DATA:  Fever EXAM: PORTABLE CHEST 1 VIEW COMPARISON:  December 03, 2020 FINDINGS: Port-A-Cath tip is in the superior  vena cava. No pneumothorax. There is ill-defined airspace opacity in the right lower lobe. Lungs elsewhere clear. Heart size and pulmonary vascularity are normal. No adenopathy. No bone lesions. Calcification noted in the left carotid artery. IMPRESSION: Airspace opacity consistent with pneumonia right lower lobe. Lungs elsewhere clear. Heart size normal. Port-A-Cath tip in superior vena cava. Left carotid artery calcification noted. Electronically Signed   By: Lowella Grip III M.D.   On: 04/29/2021 12:53   Medications: I have reviewed the patient's current medications. Prior to Admission:  Medications Prior to Admission  Medication Sig Dispense Refill Last Dose   hydrOXYzine (ATARAX/VISTARIL) 25 MG tablet Take 1 tablet (25 mg total) by mouth 2 (two) times daily as needed. 30 tablet 0 04/26/2021 at NIGHT   lactulose (CHRONULAC) 10 GM/15ML solution Take 30 mLs (20 g total) by mouth 3 (three) times daily. 1800 mL 2 04/26/2021 at PM   pantoprazole (PROTONIX) 40 MG tablet Take 1 tablet (40 mg total) by mouth daily. 90 tablet 3 04/26/2021 at AM   potassium chloride (KLOR-CON) 10 MEQ tablet Take 10 mEq by mouth daily.      POTASSIUM CHLORIDE PO Take 15 mEq by mouth daily.   04/26/2021 at AM   sodium bicarbonate 650 MG tablet Take 1,300 mg by mouth 2 (two) times daily.   04/26/2021 at NIGHT   spironolactone (ALDACTONE) 100 MG tablet TAKE 1 TABLET BY MOUTH EVERY DAY 30 tablet 2 04/26/2021 at AM   torsemide (DEMADEX) 20 MG tablet TAKE 2 TABLETS BY MOUTH 2 TIMES DAILY. 120 tablet 2 04/26/2021 at NIGHT   XIFAXAN 550 MG TABS tablet Take 550 mg by mouth 2 (two) times daily.   04/26/2021 at NIGHT   acetaminophen (TYLENOL) 500 MG tablet Take 500 mg by mouth daily as needed for moderate pain or headache. (Patient not taking: No sig reported)      apixaban (ELIQUIS) 5 MG TABS tablet Take 64m po bid for 7 days then 534mpo bid (Patient not taking: No sig reported) 60 tablet 1 Not Taking   aspirin-sod bicarb-citric acid  (ALKA-SELTZER) 325 MG TBEF tablet Take 650 mg by mouth every 6 (six) hours as needed (indigestion).   PRN   benzonatate (TESSALON) 100 MG capsule Take 100 mg by mouth 4 (four) times daily as needed. (Patient not taking: No sig reported)   Completed Course   cyanocobalamin (,VITAMIN B-12,) 1000 MCG/ML injection Inject 1,000 mcg into the muscle every 30 (thirty) days.       Misc. Devices KIT Moderate compression hose 20-30 MM HG 1 kit 0    ondansetron (ZOFRAN) 4 MG tablet Take 4 mg by mouth every 8 (eight) hours as needed for nausea or vomiting.    PRN   OneTouch Delica Lancets [id number] 2 (two) times daily.      ONETOUCH ULTRA  test strip 2 (two) times daily.      oxyCODONE (OXY IR/ROXICODONE) 5 MG immediate release tablet Take 1 tablet (5 mg total) by mouth 2 (two) times daily as needed for moderate pain. (Patient not taking: No sig reported) 10 tablet 0 Not Taking   promethazine-dextromethorphan (PROMETHAZINE-DM) 6.25-15 MG/5ML syrup Take 5 mLs by mouth as needed. (Patient not taking: No sig reported)   Not Taking   Tetrahydrozoline HCl (VISINE OP) Place 1 drop into both eyes daily as needed (irritaiton).   PRN   Vitamin D, Ergocalciferol, (DRISDOL) 1.25 MG (50000 UNIT) CAPS capsule Take 50,000 Units by mouth every _0 /15/22 1700  cefTRIAXone (ROCEPHIN) 2 g in sodium chloride 0.9 % 100 mL IVPB  Status:  Discontinued        2 g 200 mL/hr over 30 Minutes Intravenous Every 24 hours 04/27/21 1653 04/29/21 1404      Scheduled Meds:  Chlorhexidine Gluconate Cloth  6 each Topical Daily   gabapentin  200 mg Oral BID   lactulose  20 g Oral TID   [START ON 05/01/2021] pantoprazole  40 mg Intravenous Q12H   rifaximin  550 mg Oral BID   sodium bicarbonate  1,300 mg Oral BID   spironolactone  100 mg Oral Daily   torsemide  40 mg Oral BID   Continuous Infusions:  sodium chloride Stopped (04/29/21 2120)   cefTRIAXone (ROCEPHIN)  IV     iron sucrose     metronidazole  PRN Meds:.sodium chloride, acetaminophen, cyclobenzaprine, hydrOXYzine, ondansetron (ZOFRAN) IV, oxyCODONE, tetrahydrozoline   Assessment: Principal Problem:   GI bleeding Active Problems:   Cirrhosis of liver without ascites (HCC)   S/P TIPS (transjugular intrahepatic portosystemic shunt)   Liver cirrhosis secondary to NASH (HCC)   Thrombocytopenia (HCC)    Chronic diastolic CHF (congestive heart failure) (HCC)   Type II diabetes mellitus with renal manifestations (HCC)   CAD (coronary artery disease)   CKD (chronic kidney disease), stage IV (HCC)   DVT (deep venous thrombosis) (HCC)   Acute blood loss anemia  Plan: Melena: S/p EGD on 04/28/2021 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  Patient received blood transfusion responded appropriately, no further episodes of melena, hemoglobin has been stable Continue Protonix 40 mg twice daily Patient has history of small bowel AVMs, but will refer enteroscopy given aspiration pneumonia and no further episodes of melena with stable hemoglobin Administer Venofer x1 dose today Recheck CBC in 1 week after discharge  Fever with leukocytosis secondary to aspiration pneumonia Leukocytosis has resolved, patient has been afebrile No evidence of ascites based on the ultrasound Urine culture and blood cultures have been negative to date Patient is currently being treated for pneumonia  Bilateral swelling of legs most likely secondary to acute on chronic DVT, s/p IVC filter placement on 6/16 Continue torsemide and spironolactone  Decompensated cirrhosis of liver Continue lactulose and rifaximin for hepatic encephalopathy Patient is scheduled to have a follow-up appointment with me on 8/29 2 g sodium diet  Patient will likely be discharged home in next 24 hours   LOS: 3 days   Gustavus Haskin 04/30/2021, 2:33 PM

## 2021-05-01 ENCOUNTER — Other Ambulatory Visit: Payer: Self-pay | Admitting: Internal Medicine

## 2021-05-01 ENCOUNTER — Other Ambulatory Visit: Payer: Self-pay | Admitting: Gastroenterology

## 2021-05-01 ENCOUNTER — Other Ambulatory Visit: Payer: Self-pay | Admitting: Family

## 2021-05-01 DIAGNOSIS — M25475 Effusion, left foot: Secondary | ICD-10-CM

## 2021-05-01 DIAGNOSIS — R609 Edema, unspecified: Secondary | ICD-10-CM

## 2021-05-01 DIAGNOSIS — M25471 Effusion, right ankle: Secondary | ICD-10-CM

## 2021-05-01 DIAGNOSIS — R635 Abnormal weight gain: Secondary | ICD-10-CM

## 2021-05-01 DIAGNOSIS — M25474 Effusion, right foot: Secondary | ICD-10-CM

## 2021-05-01 DIAGNOSIS — I5032 Chronic diastolic (congestive) heart failure: Secondary | ICD-10-CM

## 2021-05-01 LAB — BASIC METABOLIC PANEL
Anion gap: 5 (ref 5–15)
BUN: 19 mg/dL (ref 8–23)
CO2: 25 mmol/L (ref 22–32)
Calcium: 7.3 mg/dL — ABNORMAL LOW (ref 8.9–10.3)
Chloride: 105 mmol/L (ref 98–111)
Creatinine, Ser: 1.66 mg/dL — ABNORMAL HIGH (ref 0.44–1.00)
GFR, Estimated: 33 mL/min — ABNORMAL LOW (ref >60)
Glucose, Bld: 146 mg/dL — ABNORMAL HIGH (ref 70–99)
Potassium: 3.4 mmol/L — ABNORMAL LOW (ref 3.5–5.1)
Sodium: 135 mmol/L (ref 135–145)

## 2021-05-01 LAB — CBC WITH DIFFERENTIAL/PLATELET
Abs Immature Granulocytes: 0.02 10*3/uL (ref 0.00–0.07)
Basophils Absolute: 0 10*3/uL (ref 0.0–0.1)
Basophils Relative: 1 %
Eosinophils Absolute: 0.2 10*3/uL (ref 0.0–0.5)
Eosinophils Relative: 3 %
HCT: 24.2 % — ABNORMAL LOW (ref 36.0–46.0)
Hemoglobin: 7.8 g/dL — ABNORMAL LOW (ref 12.0–15.0)
Immature Granulocytes: 0 %
Lymphocytes Relative: 15 %
Lymphs Abs: 1 10*3/uL (ref 0.7–4.0)
MCH: 27.2 pg (ref 26.0–34.0)
MCHC: 32.2 g/dL (ref 30.0–36.0)
MCV: 84.3 fL (ref 80.0–100.0)
Monocytes Absolute: 0.8 10*3/uL (ref 0.1–1.0)
Monocytes Relative: 12 %
Neutro Abs: 4.5 10*3/uL (ref 1.7–7.7)
Neutrophils Relative %: 69 %
Platelets: 82 10*3/uL — ABNORMAL LOW (ref 150–400)
RBC: 2.87 MIL/uL — ABNORMAL LOW (ref 3.87–5.11)
RDW: 18.5 % — ABNORMAL HIGH (ref 11.5–15.5)
WBC: 6.6 10*3/uL (ref 4.0–10.5)
nRBC: 0 % (ref 0.0–0.2)

## 2021-05-01 LAB — URINE CULTURE: Culture: <10000 — AB

## 2021-05-01 LAB — GLUCOSE, CAPILLARY: Glucose-Capillary: 115 mg/dL — ABNORMAL HIGH (ref 70–99)

## 2021-05-01 MED ORDER — GABAPENTIN 100 MG PO CAPS: 200.0000 mg | ORAL_CAPSULE | Freq: Two times a day (BID) | ORAL | 0 refills | Status: DC

## 2021-05-01 MED ORDER — HEPARIN SOD (PORK) LOCK FLUSH 100 UNIT/ML IV SOLN
500.0000 [IU] | Freq: Once | INTRAVENOUS | Status: AC
  Administered 2021-05-01: 500 [IU] via INTRAVENOUS
  Filled 2021-05-01: qty 5

## 2021-05-01 MED ORDER — PANTOPRAZOLE SODIUM 40 MG PO TBEC: 40.0000 mg | DELAYED_RELEASE_TABLET | Freq: Two times a day (BID) | ORAL | 0 refills | Status: DC

## 2021-05-01 MED ORDER — HEPARIN SOD (PORK) LOCK FLUSH 1 UNIT/ML IV SOLN
0.5000 mL | INTRAVENOUS | Status: DC | PRN
  Filled 2021-05-01: qty 2

## 2021-05-01 MED ORDER — AMOXICILLIN-POT CLAVULANATE 875-125 MG PO TABS: 1.0000 | ORAL_TABLET | Freq: Two times a day (BID) | ORAL | 0 refills | Status: AC

## 2021-05-01 MED ORDER — BENZONATATE 100 MG PO CAPS: 100.0000 mg | ORAL_CAPSULE | Freq: Four times a day (QID) | ORAL | 0 refills | Status: DC | PRN

## 2021-05-01 MED ORDER — COVID-19 MRNA VACC (MODERNA) 50 MCG/0.25ML IM SUSP
0.2500 mL | Freq: Once | INTRAMUSCULAR | Status: AC
  Administered 2021-05-01: 0.25 mL via INTRAMUSCULAR
  Filled 2021-05-01 (×2): qty 0.25

## 2021-05-01 NOTE — Progress Notes (Signed)
Pt provided discharge instructions and all questions and concerns addressed at this time. Pt's mode of transport at discharge is wheelchair.  05/01/21 1258  Vitals  Temp 98.2 F (36.8 C)  Temp Source Oral  BP (!) 118/55  MAP (mmHg) 74  BP Location Left Arm  BP Method Automatic  Patient Position (if appropriate) Sitting  Pulse Rate 72  Resp 16  MEWS COLOR  MEWS Score Color Green  Oxygen Therapy  SpO2 95 %  O2 Device Room Air

## 2021-05-01 NOTE — Discharge Summary (Signed)
Physician Discharge Summary  Crystal [name] UVO:[id number] DOB: Apr 28, 1951 DOA: 04/27/2021  PCP: Crystal [name], No  Admit date: 04/27/2021 Discharge date: 05/01/2021  Admitted From: Home Disposition: Home with home health  Recommendations for Outpatient Follow-up:  Follow up with PCP in 1-2 weeks Follow-up with GI as directed Please obtain CBC in 1 week  Home Health: Yes Equipment/Devices: No  Discharge Condition: Stable CODE STATUS: Full Diet recommendation: Low-sodium  Brief/Interim Summary: Crystal [name] is a 70 y.o. female with medical history significant of liver cirrhosis, s/p of TIPS, hepatitis C, DM, GIB, PUD, angiodysplasia of the small intestine, GAVE (gastric antral vascular ectasis), anemia, dCHF, CAD, CKD-IV, DVT (stopped Eliquis 1 week ago per pt), thrombocytopenia, who presents with dark stool.   GI consultation obtained.  Status post EGD on 6/16.  Single nonbleeding angiectasia in the stomach treated with APC.  Hemoglobin is been stable afterwards.  Initial plan was for small bowel enteroscopy however this was canceled due to new onset fever   Fever secondary to presumed aspiration pneumonia post endoscopy.  Started on aspiration precautions and antibiotics.  Hemoglobin stable at time of discharge.  Patient is on room air and afebrile.  Case discussed with GI on 6/18.  Okay for discharge from their standpoint.  Will recommend patient discontinue her oral anticoagulation and initiate twice daily Protonix.  We will follow-up with GI and PCP as outpatient.  Will need repeat CBC in 1 week  Discharge Diagnoses:  Principal Problem:   GI bleeding Active Problems:   Cirrhosis of liver without ascites (HCC)   S/P TIPS (transjugular intrahepatic portosystemic shunt)   Liver cirrhosis secondary to NASH (HCC)   Thrombocytopenia (HCC)   Chronic diastolic CHF (congestive heart failure) (HCC)   Type II diabetes mellitus with renal manifestations (HCC)   CAD (coronary artery disease)   CKD  (chronic kidney disease), stage IV (HCC)   DVT (deep venous thrombosis) (HCC)   Acute blood loss anemia  GI bleeding and anemia due to blood loss: Hgb 10.0 -->5.9. Dr. Marius [name] of GI is consulted.  - Hgb 5.9>>transfuse 2 units >> 7.4>>7.8 - IV pantoprazole gtt--changed to bid - Octreotide gtt--now d/ced - Zofran IV for nausea - Avoid NSAIDs and SQ heparin - Monitor closely and follow q6h cbc, transfuse as necessary, if Hgb<7.0 -hold aspirin -- G.I. consultation with Dr. Marius [name].  --6/16-- EGD  - Normal duodenal bulb and second portion of the                        duodenum.                        - A single non-bleeding angioectasia in the stomach.                        Treated with argon plasma coagulation (APC).                        - Gastric antral vascular ectasia without bleeding.                        Treated with argon plasma coagulation (APC).                        - Normal esophagus. --6/17-- canceled small bowel enteroscopy due to fever Small bowel enteroscopy deferred outpatient.  Patient discharged  home with outpatient GI follow-up   Fever and leukocytosis due to right lower lobe aspiration pneumonia  likely due to endoscopy Patient developed fevers day after endoscopy Initially started on IV Unasyn Patient could not tolerate due to severe hand cramps Unclear whether this represents a true allergy Transition to Rocephin and metronidazole while in-house Transition to p.o. Augmentin on discharge   Cirrhosis of liver without ascites and liver cirrhosis secondary to NASH  S/P TIPS (transjugular intrahepatic portosystemic shunt).  -- Mental status normal. Ammonia 45 -continue lactulose 20 g 3 times daily -Continue Xifaxan   Thrombocytopenia (HCC) Low but stable.  Presumably due to liver cirrhosis   Chronic diastolic CHF (congestive heart failure) ([place]) 2D echo on 12/14/2020 showed EF of 55-60% with grade 1 diastolic dysfunction.  Patient has  2+ bilateral leg edema,  which is likely due to DVT partially . No worsening shortness of breath.  Does not seem to have CHF acute exacerbation -resume torsemide and spironolactone   Type II diabetes mellitus with renal manifestations (HCC) -Resume oral regimen on discharge    CKD (chronic kidney disease), stage IV (HCC)  stable.  Recent baseline creatinine 1.5-2.0. 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 Lucky [name] appreciated pt is s/p IVC filer placement. -- No eliquis at discharge.  Discharge Instructions  Discharge Instructions     Diet - low sodium heart healthy   Complete by: As directed    Increase activity slowly   Complete by: As directed       Allergies as of 05/01/2021   No Known Allergies      Medication List     STOP taking these medications    apixaban 5 MG Tabs tablet Commonly known as: ELIQUIS   oxyCODONE 5 MG immediate release tablet Commonly known as: Oxy IR/ROXICODONE   promethazine-dextromethorphan 6.25-15 MG/5ML syrup Commonly known as: PROMETHAZINE-DM       TAKE these medications    amoxicillin-clavulanate 875-125 MG tablet Commonly known as: Augmentin Take 1 tablet by mouth 2 (two) times daily for 5 days. Start taking on: May 02, 2021   aspirin-sod bicarb-citric acid 325 MG Tbef tablet Commonly known as: ALKA-SELTZER Take 650 mg by mouth every 6 (six) hours as needed (indigestion).   benzonatate 100 MG capsule Commonly known as: TESSALON Take 1 capsule (100 mg total) by mouth 4 (four) times daily as needed.   cyanocobalamin 1000 MCG/ML injection Commonly known as: (VITAMIN B-12) Inject 1,000 mcg into the muscle every 30 (thirty) days.   gabapentin 100 MG capsule Commonly known as: NEURONTIN Take 2 capsules (200 mg total) by mouth 2 (two) times daily for 10 days.   hydrOXYzine 25 MG  tablet Commonly known as: ATARAX/VISTARIL Take 1 tablet (25 mg total) by mouth 2 (two) times daily as needed.   lactulose 10 GM/15ML solution Commonly known as: CHRONULAC Take 30 mLs (20 g total) by mouth 3 (three) times daily.   Misc. Devices Kit Moderate compression hose 20-30 MM HG   ondansetron 4 MG tablet Commonly known as: ZOFRAN Take 4 mg by mouth every 8 (eight) hours as needed for nausea or vomiting.   OneTouch Delica Lancets 57Q Misc 2 (two) times daily.   OneTouch Ultra test strip Generic drug: glucose blood 2 (two) times daily.   pantoprazole 40 MG tablet Commonly known as: PROTONIX  Take 1 tablet (40 mg total) by mouth 2 (two) times daily. What changed: when to take this   potassium chloride 10 MEQ tablet Commonly known as: KLOR-CON Take 10 mEq by mouth daily. What changed: Another medication with the same name was removed. Continue taking this medication, and follow the directions you see here.   sodium bicarbonate 650 MG tablet Take 1,300 mg by mouth 2 (two) times daily.   spironolactone 100 MG tablet Commonly known as: ALDACTONE TAKE 1 TABLET BY MOUTH EVERY DAY   torsemide 20 MG tablet Commonly known as: DEMADEX TAKE 2 TABLETS BY MOUTH 2 TIMES DAILY.   VISINE OP Place 1 drop into both eyes daily as needed (irritaiton).   Vitamin D (Ergocalciferol) 1.25 MG (50000 UNIT) Caps capsule Commonly known as: DRISDOL Take 50,000 Units by mouth every Sunday.   Xifaxan 550 MG Tabs tablet Generic drug: rifaximin Take 550 mg by mouth 2 (two) times daily.       ASK your doctor about these medications    acetaminophen 500 MG tablet Commonly known as: TYLENOL Take 500 mg by mouth daily as needed for moderate pain or headache.        Follow-up Information     End, Harrell Gave, MD. Schedule an appointment as soon as possible for a visit in 1 week(s).   Specialty: Cardiology Why: Need CBC in 1 week Contact information: [address]  [place] [address] [phone]         Lin Landsman, MD. Schedule an appointment as soon as possible for a visit in 2 day(s).   Specialty: Gastroenterology Contact information: [place] [address] [phone]                No Known Allergies  Consultations: GI Vascular   Procedures/Studies: CT Head Wo Contrast  Result Date: 04/04/2021 CLINICAL DATA:  Altered mental status. EXAM: CT HEAD WITHOUT CONTRAST TECHNIQUE: Contiguous axial images were obtained from the base of the skull through the vertex without intravenous contrast. COMPARISON:  August 20, 2019 FINDINGS: Brain: There is mild cerebral atrophy with widening of the extra-axial spaces and ventricular dilatation. There are areas of decreased attenuation within the white matter tracts of the supratentorial brain, consistent with microvascular disease changes. Vascular: No hyperdense vessel or unexpected calcification. Skull: Normal. Negative for fracture or focal lesion. Sinuses/Orbits: No acute finding. Other: The study is limited secondary to patient motion. IMPRESSION: Limited study, as described above, without and acute intracranial abnormality. Electronically Signed   By: Virgina Norfolk M.D.   On: 04/04/2021 01:10   US Abdomen Complete  Result Date: 04/29/2021 CLINICAL DATA:  Fever EXAM: ABDOMEN ULTRASOUND COMPLETE COMPARISON:  None. FINDINGS: Gallbladder: Within the gallbladder, there are echogenic foci which move and shadow consistent with cholelithiasis. Largest gallstone measures 12 mm in length. No evident gallbladder wall thickening or pericholecystic fluid. No sonographic Murphy sign noted by sonographer. Common bile duct: Diameter: The proximal common bile duct is prominent, measuring 9 mm. Portions of the extrahepatic biliary ducts obscured by gas. No intrahepatic biliary duct dilatation evident. Liver: No focal lesion identified. The liver contour is somewhat nodular. The  liver echotexture is diffusely increased and rather coarse. Portal vein is patent on color Doppler imaging with normal direction of blood flow towards the liver. IVC: No abnormality visualized. Pancreas: No pancreatic mass or inflammatory focus. Spleen: Spleen measures 14.7 x 7.5 x 15.2 cm with a measured splenic volume of 741 cubic cm. No  focal splenic lesions are evident. Right Kidney: Length: 9.9 cm. Echogenicity within normal limits. No mass or hydronephrosis visualized. Left Kidney: Length: 10.2 cm. Echogenicity within normal limits. No mass or hydronephrosis visualized. Abdominal aorta: No aneurysm visualized. Other findings: No evident ascites. IMPRESSION: 1. Cholelithiasis. No gallbladder wall thickening or pericholecystic fluid. 2. Prominence of the proximal common bile duct measuring 9 mm. No obstructing focus evident. Note that portions of the common bile duct obscured by gas. From an imaging standpoint, MRCP would be the optimum imaging study of choice to further evaluate the biliary ductal system. 3. Liver has a nodular contour with a rather coarsened diffuse echogenicity pattern, findings indicative of a degree of hepatic cirrhosis. No focal liver lesions evident. Note that the sensitivity of ultrasound for detection of focal liver lesions is diminished significantly in this circumstance. 4.  Splenomegaly.  No focal splenic lesions. 5.  Study otherwise unremarkable. Electronically Signed   By: Lowella Grip III M.D.   On: 04/29/2021 12:52   PERIPHERAL VASCULAR CATHETERIZATION  Result Date: 04/28/2021 See surgical note for result.  US Venous Img Lower Bilateral (DVT)  Result Date: 04/07/2021 CLINICAL DATA:  Initial evaluation for bilateral foot pain. EXAM: BILATERAL LOWER EXTREMITY VENOUS DOPPLER ULTRASOUND TECHNIQUE: Gray-scale sonography with graded compression, as well as color Doppler and duplex ultrasound were performed to evaluate the lower extremity deep venous systems from the level  of the common femoral vein and including the common femoral, femoral, profunda femoral, popliteal and calf veins including the posterior tibial, peroneal and gastrocnemius veins when visible. The superficial great saphenous vein was also interrogated. Spectral Doppler was utilized to evaluate flow at rest and with distal augmentation maneuvers in the common femoral, femoral and popliteal veins. COMPARISON:  Prior study from 03/30/2020 FINDINGS: RIGHT LOWER EXTREMITY Common Femoral Vein: No evidence of thrombus. Normal compressibility, respiratory phasicity and response to augmentation. Saphenofemoral Junction: No evidence of thrombus. Normal compressibility and flow on color Doppler imaging. Profunda Femoral Vein: No evidence of thrombus. Normal compressibility and flow on color Doppler imaging. Femoral Vein: No evidence of thrombus. Normal compressibility, respiratory phasicity and response to augmentation. Popliteal Vein: Hypoechoic material distending the right popliteal vein suspicious for thrombus. This is nonocclusive. Loss of normal compressibility. Calf Veins: Nonocclusive thrombus extends into the right posterior tibial and peroneal veins. Superficial Great Saphenous Vein: No evidence of thrombus. Normal compressibility. Venous Reflux:  None. Other Findings:  None. LEFT LOWER EXTREMITY Common Femoral Vein: Again seen is heterogeneous echogenic material positioned along the wall of the distal left common femoral vein, likely reflecting chronic thrombus. Luminal narrowing with decreased flow seen at this level. This is relatively similar in appearance from previous. The remainder of the visualized left common femoral vein appears widely patent. Saphenofemoral Junction: No evidence of thrombus. Normal compressibility and flow on color Doppler imaging. Profunda Femoral Vein: No evidence of thrombus. Normal compressibility and flow on color Doppler imaging. Femoral Vein: No evidence of thrombus. Normal  compressibility, respiratory phasicity and response to augmentation. Popliteal Vein: No evidence of thrombus. Normal compressibility, respiratory phasicity and response to augmentation. Calf Veins: No evidence of thrombus. Normal compressibility and flow on color Doppler imaging. Superficial Great Saphenous Vein: No evidence of thrombus. Normal compressibility. Venous Reflux:  None. Other Findings:  None. IMPRESSION: 1. Acute nonocclusive DVT involving the right popliteal vein extending into the veins of the right calf. 2. 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  No other evidence for acute DVT within the left lower extremity. Electronically Signed   By: Jeannine Boga M.D.   On: 04/07/2021 03:34   DG Chest Port 1 View  Result Date: 04/29/2021 CLINICAL DATA:  Fever EXAM: PORTABLE CHEST 1 VIEW COMPARISON:  December 03, 2020 FINDINGS: Port-A-Cath tip is in the superior vena cava. No pneumothorax. There is ill-defined airspace opacity in the right lower lobe. Lungs elsewhere clear. Heart size and pulmonary vascularity are normal. No adenopathy. No bone lesions. Calcification noted in the left carotid artery. IMPRESSION: Airspace opacity consistent with pneumonia right lower lobe. Lungs elsewhere clear. Heart size normal. Port-A-Cath tip in superior vena cava. Left carotid artery calcification noted. Electronically Signed   By: Lowella Grip III M.D.   On: 04/29/2021 12:53   (Echo, Carotid, EGD, Colonoscopy, ERCP)    Subjective: Seen and examined at the time of discharge.  Stable, no distress.  Sitting up in bed.  Stable for discharge home  Discharge Exam: Vitals:   05/01/21 0455 05/01/21 0959  BP: (!) 103/55 109/65  Pulse: 71 70  Resp: 18   Temp: 98.7 F (37.1 C) 98.8 F (37.1 C)  SpO2: 95%    Vitals:   04/30/21 1214 04/30/21 1942 05/01/21 0455 05/01/21 0959  BP: (!) 107/57 109/64 (!) 103/55  109/65  Pulse: 62 65 71 70  Resp: _0 Temp: 98.6 F (37 C) 98 F (36.7 C) 98.7 F (37.1 C) 98.8 F (37.1 C)  TempSrc: Oral Oral Oral Oral  SpO2: 94% 98% 95%   Weight:   95.7 kg   Height:        General: Pt is alert, awake, not in acute distress Cardiovascular: RRR, S1/S2 +, no rubs, no gallops Respiratory: CTA bilaterally, no wheezing, no rhonchi Abdominal: Soft, NT, ND, bowel sounds + Extremities: no edema, no cyanosis    The results of significant diagnostics from this hospitalization (including imaging, microbiology, ancillary and laboratory) are listed below for reference.     Microbiology: Recent Results (from the past 240 hour(s))  Resp Panel by RT-PCR (Flu A&B, Covid) Nasopharyngeal Swab     Status: None   Collection Time: 04/27/21 12:35 PM   Specimen: Nasopharyngeal Swab; Nasopharyngeal(NP) swabs in vial transport medium  Result Value Ref Range Status   SARS Coronavirus 2 by RT PCR NEGATIVE NEGATIVE Final    Comment: (NOTE) SARS-CoV-2 target nucleic acids are NOT DETECTED.  The SARS-CoV-2 RNA is generally detectable in upper respiratory specimens during the acute phase of infection. The lowest concentration of SARS-CoV-2 viral copies this assay can detect is 138 copies/mL. A negative result does not preclude SARS-Cov-2 infection and should not be used as the sole basis for treatment or other patient management decisions. A negative result may occur with  improper specimen collection/handling, submission of specimen other than nasopharyngeal swab, presence of viral mutation(s) within the areas targeted by this assay, and inadequate number of viral copies(<138 copies/mL). A negative result must be combined with clinical observations, patient history, and epidemiological information. The expected result is Negative.  Fact Sheet for Patients:  EntrepreneurPulse.com.au  Fact Sheet for Healthcare Providers:   IncredibleEmployment.be  This test is no t yet approved or cleared by the [place] FDA and  has been authorized for detection and/or diagnosis of SARS-CoV-2 by FDA under an Emergency Use Authorization (EUA). This EUA will remain  in effect (meaning this test can be used) for the duration of the COVID-19 declaration under Section 564(b)(1) of  the Act, 21 U.S.C.section 360bbb-3(b)(1), unless the authorization is terminated  or revoked sooner.       Influenza A by PCR NEGATIVE NEGATIVE Final   Influenza B by PCR NEGATIVE NEGATIVE Final    Comment: (NOTE) The Xpert Xpress SARS-CoV-2/FLU/RSV plus assay is intended as an aid in the diagnosis of influenza from Nasopharyngeal swab specimens and should not be used as a sole basis for treatment. Nasal washings and aspirates are unacceptable for Xpert Xpress SARS-CoV-2/FLU/RSV testing.  Fact Sheet for Patients: EntrepreneurPulse.com.au  Fact Sheet for Healthcare Providers: IncredibleEmployment.be  This test is not yet approved or cleared by the [place] FDA and has been authorized for detection and/or diagnosis of SARS-CoV-2 by FDA under an Emergency Use Authorization (EUA). This EUA will remain in effect (meaning this test can be used) for the duration of the COVID-19 declaration under Section 564(b)(1) of the Act, 21 U.S.C. section 360bbb-3(b)(1), unless the authorization is terminated or revoked.  Performed at [facility], [address] [place], [place] [address]   Culture, blood (single) w Reflex to ID Panel     Status: None (Preliminary result)   Collection Time: 04/29/21  3:05 PM   Specimen: BLOOD  Result Value Ref Range Status   Specimen Description BLOOD BLOOD LEFT FOREARM  Final   Special Requests   Final    BOTTLES DRAWN AEROBIC AND ANAEROBIC Blood Culture adequate volume   Culture   Final    NO GROWTH 2 DAYS Performed at [facility],  [address] [place], [place] [address]    Report Status PENDING  Incomplete  Urine Culture     Status: Abnormal   Collection Time: 04/29/21  5:21 PM   Specimen: Urine, Random  Result Value Ref Range Status   Specimen Description   Final    URINE, RANDOM Performed at [facility], [address], [place], [place] [address]    Special Requests   Final    NONE Performed at [facility], [address], [place], [place] [address]    Culture (A)  Final    <10,000 COLONIES/mL INSIGNIFICANT GROWTH Performed at [place] [address], [place], [place] [address]    Report Status 05/01/2021 FINAL  Final     Labs: BNP (last 3 results) Recent Labs    12/02/20 1943 12/03/20 1630 04/27/21 0945  BNP 434.5* 514.6* 329.9*   Basic Metabolic Panel: Recent Labs  Lab 04/27/21 0945 04/28/21 0559 04/29/21 1959 04/30/21 0500 05/01/21 0541  NA 130* 130* 131* 131* 135  K 3.8 3.8 3.3* 3.7 3.4*  CL 96* 101 102 102 105  CO2 _0 GLUCOSE 131* 109* 224* 144* 146*  BUN 26* _1 CREATININE 1.88* 1.65* 1.83* 1.71* 1.66*  CALCIUM 8.0* 7.4* 7.3* 7.4* 7.3*  MG  --   --  1.8  --   --    Liver Function Tests: Recent Labs  Lab 04/27/21 0945  AST 27  ALT 12  ALKPHOS 130*  BILITOT 1.3*  PROT 5.5*  ALBUMIN 2.1*   No results for input(s): LIPASE, AMYLASE in the last 168 hours. Recent Labs  Lab 04/27/21 1400  AMMONIA 19   CBC: Recent Labs  Lab 04/28/21 0555 04/28/21 2221 04/29/21 0804 04/30/21 0500 05/01/21 0541  WBC 4.2 5.8 11.6* 7.1 6.6  NEUTROABS  --   --   --  5.2 4.5  HGB 7.4* 7.7* 7.8* 7.4* 7.8*  HCT  21.9* 23.7* 22.9* 22.9* 24.2*  MCV 81.4 83.2 80.9 84.2 84.3  PLT 109* 103* 81* 82* 82*   Cardiac Enzymes: No results for input(s): CKTOTAL, CKMB, CKMBINDEX, TROPONINI in the last 168 hours. BNP: Invalid input(s): POCBNP CBG: Recent Labs  Lab 04/28/21 1448 04/28/21 1655 04/29/21 0755 04/30/21 0750 05/01/21 0826   GLUCAP 87 74 145* 105* 115*   D-Dimer No results for input(s): DDIMER in the last 72 hours. Hgb A1c No results for input(s): HGBA1C in the last 72 hours. Lipid Profile No results for input(s): CHOL, HDL, LDLCALC, TRIG, CHOLHDL, LDLDIRECT in the last 72 hours. Thyroid function studies No results for input(s): TSH, T4TOTAL, T3FREE, THYROIDAB in the last 72 hours.  Invalid input(s): FREET3 Anemia work up No results for input(s): VITAMINB12, FOLATE, FERRITIN, TIBC, IRON, RETICCTPCT in the last 72 hours. Urinalysis    Component Value Date/Time   COLORURINE STRAW (A) 04/04/2021 0127   APPEARANCEUR CLEAR (A) 04/04/2021 0127   LABSPEC 1.006 04/04/2021 0127   PHURINE 9.0 (H) 04/04/2021 0127   GLUCOSEU NEGATIVE 04/04/2021 0127   HGBUR NEGATIVE 04/04/2021 0127   BILIRUBINUR NEGATIVE 04/04/2021 0127   KETONESUR NEGATIVE 04/04/2021 0127   PROTEINUR NEGATIVE 04/04/2021 0127   NITRITE NEGATIVE 04/04/2021 0127   LEUKOCYTESUR LARGE (A) 04/04/2021 0127   Sepsis Labs Invalid input(s): PROCALCITONIN,  WBC,  LACTICIDVEN Microbiology Recent Results (from the past 240 hour(s))  Resp Panel by RT-PCR (Flu A&B, Covid) Nasopharyngeal Swab     Status: None   Collection Time: 04/27/21 12:35 PM   Specimen: Nasopharyngeal Swab; Nasopharyngeal(NP) swabs in vial transport medium  Result Value Ref Range Status   SARS Coronavirus 2 by RT PCR NEGATIVE NEGATIVE Final    Comment: (NOTE) SARS-CoV-2 target nucleic acids are NOT DETECTED.  The SARS-CoV-2 RNA is generally detectable in upper respiratory specimens during the acute phase of infection. The lowest concentration of SARS-CoV-2 viral copies this assay can detect is 138 copies/mL. A negative result does not preclude SARS-Cov-2 infection and should not be used as the sole basis for treatment or other patient management decisions. A negative result may occur with  improper specimen collection/handling, submission of specimen other than nasopharyngeal  swab, presence of viral mutation(s) within the areas targeted by this assay, and inadequate number of viral copies(<138 copies/mL). A negative result must be combined with clinical observations, patient history, and epidemiological information. The expected result is Negative.  Fact Sheet for Patients:  EntrepreneurPulse.com.au  Fact Sheet for Healthcare Providers:  IncredibleEmployment.be  This test is no t yet approved or cleared by the [place] FDA and  has been authorized for detection and/or diagnosis of SARS-CoV-2 by FDA under an Emergency Use Authorization (EUA). This EUA will remain  in effect (meaning this test can be used) for the duration of the COVID-19 declaration under Section 564(b)(1) of the Act, 21 U.S.C.section 360bbb-3(b)(1), unless the authorization is terminated  or revoked sooner.       Influenza A by PCR NEGATIVE NEGATIVE Final   Influenza B by PCR NEGATIVE NEGATIVE Final    Comment: (NOTE) The Xpert Xpress SARS-CoV-2/FLU/RSV plus assay is intended as an aid in the diagnosis of influenza from Nasopharyngeal swab specimens and should not be used as a sole basis for treatment. Nasal washings and aspirates are unacceptable for Xpert Xpress SARS-CoV-2/FLU/RSV testing.  Fact Sheet for Patients: [employer]  Fact Sheet for Healthcare Providers: IncredibleEmployment.be  This test is not yet approved or cleared by the [place] and has been authorized for  detection and/or diagnosis of SARS-CoV-2 by FDA under an Emergency Use Authorization (EUA). This EUA will remain in effect (meaning this test can be used) for the duration of the COVID-19 declaration under Section 564(b)(1) of the Act, 21 U.S.C. section 360bbb-3(b)(1), unless the authorization is terminated or revoked.  Performed at [facility], [address] [place], [place] [address]   Culture,  blood (single) w Reflex to ID Panel     Status: None (Preliminary result)   Collection Time: 04/29/21  3:05 PM   Specimen: BLOOD  Result Value Ref Range Status   Specimen Description BLOOD BLOOD LEFT FOREARM  Final   Special Requests   Final    BOTTLES DRAWN AEROBIC AND ANAEROBIC Blood Culture adequate volume   Culture   Final    NO GROWTH 2 DAYS Performed at [facility], [address], [place], [place] [address]    Report Status PENDING  Incomplete  Urine Culture     Status: Abnormal   Collection Time: 04/29/21  5:21 PM   Specimen: Urine, Random  Result Value Ref Range Status   Specimen Description   Final    URINE, RANDOM Performed at [facility], [address], [place], [place] [address]    Special Requests   Final    NONE Performed at [facility], [address], [place], [place] [address]    Culture (A)  Final    <10,000 COLONIES/mL INSIGNIFICANT GROWTH Performed at [facility] [address] [place], [place] [address]    Report Status 05/01/2021 FINAL  Final     Time coordinating discharge: Over 30 minutes  SIGNED:   Sidney Ace, MD  [facility] 05/01/2021, 10:53 AM Pager   If 7PM-7AM, please contact night-coverage

## 2021-05-01 NOTE — TOC Transition Note (Signed)
Transition of Care [facility]) - CM/SW Discharge Note   Patient Details  Name: Crystal [name] MRN: [id number] Date of Birth: February 14, 1951  Transition of [facility]) CM/SW Contact:  Magnus Ivan, LCSW Phone Number: 05/01/2021, 9:18 AM   Clinical Narrative:   Patient to discharge home today. Notified [name] with [facility] of discharge. No additional TOC needs identified prior to discharge.    Final next level of care: Home w Home Health Services Barriers to Discharge: Barriers Resolved   Patient Goals and CMS Choice   CMS Medicare.gov Compare Post Acute Care list provided to:: Patient Choice offered to / list presented to : Patient  Discharge Placement                    Patient and family notified of of transfer: 05/01/21  Discharge Plan and Services                DME Arranged: Gilford Rile rolling, 3-N-1 DME Agency: [employer]       HH Arranged: RN, PT HH Agency: [facility] Date [place]: 05/01/21   Representative spoke with at [place]: [place]) Interventions     Readmission Risk Interventions Readmission Risk Prevention Plan 04/28/2021 04/07/2021  Transportation Screening Complete Complete  Medication Review Press photographer) Complete Complete  PCP or Specialist appointment within 3-5 days of discharge - Complete  HRI or [place] Complete Complete  SW Recovery Care/Counseling Consult - Complete  Palliative Care Screening Not Applicable Not [place] Not Applicable Not Applicable  Some recent data might be hidden

## 2021-05-02 ENCOUNTER — Other Ambulatory Visit: Payer: Self-pay | Admitting: *Deleted

## 2021-05-02 ENCOUNTER — Other Ambulatory Visit: Payer: Self-pay | Admitting: Gastroenterology

## 2021-05-02 ENCOUNTER — Encounter: Payer: Self-pay | Admitting: *Deleted

## 2021-05-02 NOTE — Patient Outreach (Signed)
[facility]) Care Management  05/02/2021  Crystal [name] June 18, 1951 [id number]   Telephone Assessment  Pt recent discharged from the hospital 6/19 for GI bleed however does not prefer weekly transition of care calls. Verified all pending appointments and requested pt to contact her primary provider concerning her recent hospitalization for possible follow up appointment. Will verify pt has not had any additional GI bleed since her discharge and aware of what to do if acute symptoms are encountered. GI bleed added to current plan of care as pt continue to do well in managing all her medical condition with no acute symptoms from her ongoing HF.   Will follow up next month with update on pt's ongoing management of care. Pt has a primary provider with [facility] in [place]. Will requested via [place] for notation in Epic for quarterly update and reach out with the initial involvement letter for this practice.    Goals Addressed             This Visit's Progress    EPP:IRJJOACZY My Quality of Life   On track    Timeframe:  Long-Range Goal Priority:  Medium Start Date:  04/18/2021                           Expected End Date: 08/12/2021                      Follow Up Date 05/18/2021   - do one enjoyable thing every day - do something different, like talking to a new person or going to a new place, every day - name a health care proxy (decision maker) - spend time outdoors at least 3 times a week   Barriers: Health Behaviors  Why is this important?   Having a long-term illness can be scary.  It can also be stressful for you and your caregiver.  These steps may help.    Notes:  6/6 Encourage pt to completed the A.D she already has but has not completed and education on POA/HCPOA related. Encouraged pt to enjoy her outings however limited with HHPT/RN involved at this time for the next few weeks before services will end with both. Pt states she is doing well however  experiences some pain upon ambulating to both legs and takes prescribed pain medication when needed mostly at night but all is tolerable. Pt encouraged to wear her again compression stockings to prevent ongoing swelling that limits her ability to ambulate.       SAY:TKZSW and Manage Symptoms-Heart Failure   On track    Timeframe:  Short-Term Goal Priority:  Medium Start Date:   04/18/2021                          Expected End Date:   06/10/2021                   Follow Up Date 05/18/2021   - begin a heart failure diary - bring diary to all appointments - develop a rescue plan - follow rescue plan if symptoms flare-up - know when to call the doctor - track symptoms and what helps feel better or worse   Barriers: Health Behaviors Knowledge  Why is this important?   You will be able to handle your symptoms better if you keep track of them.  Making some simple changes to your lifestyle will  help.  Eating healthy is one thing you can do to take good care of yourself.    Notes:  6/6- Pt verbalized an understanding via teach back on HF symptoms and what to do if acute symptoms should occur by contact her heart doctor related to 3lbs overnight and 5lbs with one week. Verified pdg appointments and transportation availability. Will send [facility] calendar and education information packet and encouraged pt to document all weights for providers to view on her upcoming appointments.       THN;Make and Keep All Appointments   On track    Timeframe:  Short-Term Goal Priority:  Medium Start Date:   6/6//2022                          Expected End Date: 06/10/2021                      Follow Up Date 05/18/2021   - arrange a ride through an agency 1 week before appointment - ask family or friend for a ride - call to cancel if needed - keep a calendar with prescription refill dates - keep a calendar with appointment dates   Barriers: Health Behaviors  Why is this important?   Part of staying healthy is  seeing the doctor for follow-up care.  If you forget your appointments, there are some things you can do to stay on track.    Notes:  6/20: Recently hospitalization with GI bleed. Plan of care adjusted with recent diagnosis added for follow up. Pt has verified understanding of all goal and interventions accordingly with review of her d/c instructions. Prefers monthly follow up calls for ongoing care management services verses weekly TOC calls.  6/6: Pt verified she has sufficient transportation to all her medical appointments with no delays or issues. Will discuss other options for transportation if needed for future transports to her provider appointments however [place] but [facility] care-guides are able to assist if needed in the future.           Raina Mina, RN Care Management Coordinator [facility] [phone]

## 2021-05-03 ENCOUNTER — Other Ambulatory Visit: Payer: Medicare HMO

## 2021-05-03 DIAGNOSIS — I2510 Atherosclerotic heart disease of native coronary artery without angina pectoris: Secondary | ICD-10-CM | POA: Diagnosis not present

## 2021-05-03 DIAGNOSIS — E1151 Type 2 diabetes mellitus with diabetic peripheral angiopathy without gangrene: Secondary | ICD-10-CM | POA: Diagnosis not present

## 2021-05-03 DIAGNOSIS — K7581 Nonalcoholic steatohepatitis (NASH): Secondary | ICD-10-CM | POA: Diagnosis not present

## 2021-05-03 DIAGNOSIS — I110 Hypertensive heart disease with heart failure: Secondary | ICD-10-CM | POA: Diagnosis not present

## 2021-05-03 DIAGNOSIS — E1122 Type 2 diabetes mellitus with diabetic chronic kidney disease: Secondary | ICD-10-CM | POA: Diagnosis not present

## 2021-05-03 DIAGNOSIS — I82431 Acute embolism and thrombosis of right popliteal vein: Secondary | ICD-10-CM | POA: Diagnosis not present

## 2021-05-03 DIAGNOSIS — N184 Chronic kidney disease, stage 4 (severe): Secondary | ICD-10-CM | POA: Diagnosis not present

## 2021-05-03 DIAGNOSIS — I82512 Chronic embolism and thrombosis of left femoral vein: Secondary | ICD-10-CM | POA: Diagnosis not present

## 2021-05-03 DIAGNOSIS — I5032 Chronic diastolic (congestive) heart failure: Secondary | ICD-10-CM | POA: Diagnosis not present

## 2021-05-03 DIAGNOSIS — I700 Atherosclerosis of aorta: Secondary | ICD-10-CM | POA: Diagnosis not present

## 2021-05-04 ENCOUNTER — Ambulatory Visit: Payer: Medicare HMO

## 2021-05-04 ENCOUNTER — Telehealth: Payer: Self-pay

## 2021-05-04 ENCOUNTER — Ambulatory Visit: Payer: Medicare HMO | Admitting: Oncology

## 2021-05-04 LAB — CULTURE, BLOOD (SINGLE)
Culture: NO GROWTH
Special Requests: ADEQUATE

## 2021-05-04 NOTE — Telephone Encounter (Signed)
When I tried to call patient it said call can not be completed at this time. Will try again at a later time

## 2021-05-04 NOTE — Telephone Encounter (Signed)
Number could not be completed sent mychart message to patient

## 2021-05-04 NOTE — Telephone Encounter (Signed)
I did not send her any message, not sure where she found it  RV

## 2021-05-04 NOTE — Telephone Encounter (Signed)
Patient states that she received your message but she did not understand what you were asking her to do. Please advise

## 2021-05-05 ENCOUNTER — Telehealth: Payer: Self-pay | Admitting: Internal Medicine

## 2021-05-05 DIAGNOSIS — I82431 Acute embolism and thrombosis of right popliteal vein: Secondary | ICD-10-CM | POA: Diagnosis not present

## 2021-05-05 DIAGNOSIS — I700 Atherosclerosis of aorta: Secondary | ICD-10-CM | POA: Diagnosis not present

## 2021-05-05 DIAGNOSIS — N184 Chronic kidney disease, stage 4 (severe): Secondary | ICD-10-CM | POA: Diagnosis not present

## 2021-05-05 DIAGNOSIS — E1151 Type 2 diabetes mellitus with diabetic peripheral angiopathy without gangrene: Secondary | ICD-10-CM | POA: Diagnosis not present

## 2021-05-05 DIAGNOSIS — I82512 Chronic embolism and thrombosis of left femoral vein: Secondary | ICD-10-CM | POA: Diagnosis not present

## 2021-05-05 DIAGNOSIS — I110 Hypertensive heart disease with heart failure: Secondary | ICD-10-CM | POA: Diagnosis not present

## 2021-05-05 DIAGNOSIS — E1122 Type 2 diabetes mellitus with diabetic chronic kidney disease: Secondary | ICD-10-CM | POA: Diagnosis not present

## 2021-05-05 DIAGNOSIS — I2510 Atherosclerotic heart disease of native coronary artery without angina pectoris: Secondary | ICD-10-CM | POA: Diagnosis not present

## 2021-05-05 DIAGNOSIS — K7581 Nonalcoholic steatohepatitis (NASH): Secondary | ICD-10-CM | POA: Diagnosis not present

## 2021-05-05 DIAGNOSIS — I5032 Chronic diastolic (congestive) heart failure: Secondary | ICD-10-CM | POA: Diagnosis not present

## 2021-05-05 NOTE — Telephone Encounter (Signed)
If it can be arranged, I think it is reasonable for the patient to receive furosemide 80 mg IV x1 through the cancer center and to follow-up as scheduled next week in our office.  If symptoms do not improve in the meantime or she has other concerns, she may need to go back to the ED before then.  Nelva Bush, MD [facility]

## 2021-05-05 NOTE — Telephone Encounter (Signed)
Christy with [facility] health calling  States that patient has a lot of fluid, has weeping edema in both legs  Would like to know if we can place an order for IV lasix to the [facility] in [facility]  Please call Christy at [phone]

## 2021-05-05 NOTE — Telephone Encounter (Signed)
Message fwd to Dr. Saunders Revel to advise.

## 2021-05-06 ENCOUNTER — Ambulatory Visit: Payer: Medicare HMO

## 2021-05-06 ENCOUNTER — Telehealth: Payer: Self-pay | Admitting: Internal Medicine

## 2021-05-06 ENCOUNTER — Telehealth: Payer: Self-pay | Admitting: *Deleted

## 2021-05-06 ENCOUNTER — Other Ambulatory Visit: Payer: Self-pay | Admitting: *Deleted

## 2021-05-06 ENCOUNTER — Inpatient Hospital Stay: Payer: Medicare HMO | Attending: Internal Medicine

## 2021-05-06 ENCOUNTER — Telehealth: Payer: Self-pay

## 2021-05-06 ENCOUNTER — Other Ambulatory Visit: Payer: Self-pay

## 2021-05-06 VITALS — BP 120/69 | HR 70 | Temp 97.7°F | Resp 18

## 2021-05-06 DIAGNOSIS — I509 Heart failure, unspecified: Secondary | ICD-10-CM | POA: Insufficient documentation

## 2021-05-06 DIAGNOSIS — E538 Deficiency of other specified B group vitamins: Secondary | ICD-10-CM | POA: Insufficient documentation

## 2021-05-06 DIAGNOSIS — E1122 Type 2 diabetes mellitus with diabetic chronic kidney disease: Secondary | ICD-10-CM | POA: Diagnosis not present

## 2021-05-06 DIAGNOSIS — K7460 Unspecified cirrhosis of liver: Secondary | ICD-10-CM | POA: Diagnosis not present

## 2021-05-06 DIAGNOSIS — I5033 Acute on chronic diastolic (congestive) heart failure: Secondary | ICD-10-CM

## 2021-05-06 DIAGNOSIS — Z7901 Long term (current) use of anticoagulants: Secondary | ICD-10-CM | POA: Diagnosis not present

## 2021-05-06 DIAGNOSIS — E8779 Other fluid overload: Secondary | ICD-10-CM

## 2021-05-06 DIAGNOSIS — I82412 Acute embolism and thrombosis of left femoral vein: Secondary | ICD-10-CM | POA: Insufficient documentation

## 2021-05-06 DIAGNOSIS — D500 Iron deficiency anemia secondary to blood loss (chronic): Secondary | ICD-10-CM | POA: Insufficient documentation

## 2021-05-06 DIAGNOSIS — R918 Other nonspecific abnormal finding of lung field: Secondary | ICD-10-CM | POA: Insufficient documentation

## 2021-05-06 DIAGNOSIS — I82431 Acute embolism and thrombosis of right popliteal vein: Secondary | ICD-10-CM | POA: Diagnosis not present

## 2021-05-06 DIAGNOSIS — R609 Edema, unspecified: Secondary | ICD-10-CM | POA: Insufficient documentation

## 2021-05-06 DIAGNOSIS — K922 Gastrointestinal hemorrhage, unspecified: Secondary | ICD-10-CM | POA: Insufficient documentation

## 2021-05-06 DIAGNOSIS — Z95828 Presence of other vascular implants and grafts: Secondary | ICD-10-CM

## 2021-05-06 DIAGNOSIS — D631 Anemia in chronic kidney disease: Secondary | ICD-10-CM | POA: Insufficient documentation

## 2021-05-06 LAB — COMPREHENSIVE METABOLIC PANEL
ALT: 10 U/L (ref 0–44)
AST: 26 U/L (ref 15–41)
Albumin: 2.1 g/dL — ABNORMAL LOW (ref 3.5–5.0)
Alkaline Phosphatase: 135 U/L — ABNORMAL HIGH (ref 38–126)
Anion gap: 10 (ref 5–15)
BUN: 20 mg/dL (ref 8–23)
CO2: 21 mmol/L — ABNORMAL LOW (ref 22–32)
Calcium: 8.3 mg/dL — ABNORMAL LOW (ref 8.9–10.3)
Chloride: 97 mmol/L — ABNORMAL LOW (ref 98–111)
Creatinine, Ser: 1.87 mg/dL — ABNORMAL HIGH (ref 0.44–1.00)
GFR, Estimated: 29 mL/min — ABNORMAL LOW (ref >60)
Glucose, Bld: 118 mg/dL — ABNORMAL HIGH (ref 70–99)
Potassium: 4.1 mmol/L (ref 3.5–5.1)
Sodium: 128 mmol/L — ABNORMAL LOW (ref 135–145)
Total Bilirubin: 1.2 mg/dL (ref 0.3–1.2)
Total Protein: 5.7 g/dL — ABNORMAL LOW (ref 6.5–8.1)

## 2021-05-06 LAB — CBC WITH DIFFERENTIAL/PLATELET
Abs Immature Granulocytes: 0.06 10*3/uL (ref 0.00–0.07)
Basophils Absolute: 0 10*3/uL (ref 0.0–0.1)
Basophils Relative: 1 %
Eosinophils Absolute: 0.1 10*3/uL (ref 0.0–0.5)
Eosinophils Relative: 1 %
HCT: 26.9 % — ABNORMAL LOW (ref 36.0–46.0)
Hemoglobin: 8.8 g/dL — ABNORMAL LOW (ref 12.0–15.0)
Immature Granulocytes: 1 %
Lymphocytes Relative: 23 %
Lymphs Abs: 1.9 10*3/uL (ref 0.7–4.0)
MCH: 27.1 pg (ref 26.0–34.0)
MCHC: 32.7 g/dL (ref 30.0–36.0)
MCV: 82.8 fL (ref 80.0–100.0)
Monocytes Absolute: 0.8 10*3/uL (ref 0.1–1.0)
Monocytes Relative: 10 %
Neutro Abs: 5.6 10*3/uL (ref 1.7–7.7)
Neutrophils Relative %: 64 %
Platelets: 125 10*3/uL — ABNORMAL LOW (ref 150–400)
RBC: 3.25 MIL/uL — ABNORMAL LOW (ref 3.87–5.11)
RDW: 19.6 % — ABNORMAL HIGH (ref 11.5–15.5)
WBC: 8.6 10*3/uL (ref 4.0–10.5)
nRBC: 0 % (ref 0.0–0.2)

## 2021-05-06 LAB — SAMPLE TO BLOOD BANK

## 2021-05-06 LAB — MAGNESIUM: Magnesium: 1.8 mg/dL (ref 1.7–2.4)

## 2021-05-06 MED ORDER — SODIUM CHLORIDE 0.9% FLUSH
10.0000 mL | INTRAVENOUS | Status: DC | PRN
  Administered 2021-05-06: 10 mL via INTRAVENOUS
  Filled 2021-05-06: qty 10

## 2021-05-06 MED ORDER — FUROSEMIDE 10 MG/ML IJ SOLN
80.0000 mg | Freq: Once | INTRAMUSCULAR | Status: DC
  Filled 2021-05-06: qty 8

## 2021-05-06 MED ORDER — HEPARIN SOD (PORK) LOCK FLUSH 100 UNIT/ML IV SOLN
500.0000 [IU] | Freq: Once | INTRAVENOUS | Status: AC
  Administered 2021-05-06: 500 [IU] via INTRAVENOUS
  Filled 2021-05-06: qty 5

## 2021-05-06 MED ORDER — FUROSEMIDE 10 MG/ML IJ SOLN
80.0000 mg | Freq: Once | INTRAMUSCULAR | Status: AC
  Administered 2021-05-06: 80 mg via INTRAVENOUS

## 2021-05-06 NOTE — Telephone Encounter (Signed)
Patient left a voicemail for call back. She states she is having a lot of swelling and now her legs are weeping. She states she did contact the cardiologist yesterday and he gave her some advice and see them on 05/11/2021. She did not know if you recommended any thing else to do

## 2021-05-06 NOTE — Telephone Encounter (Signed)
I called and spoke with Alyse Low, [facility] RN. I advised Alyse Low that Dr. Saunders Revel received her message and advised that it would be ok for the patient to receive lasix 80 mg IV x 1 dose. I advised Alyse Low that we have not sent patient's to the [facility] in [place] to have this done before, so I am unsure of this process. I explained to Lorena, that I will have to try to figure out how to facilitate this.  Christy then asked if we could also send over a CBC order for the patient to have this done while there as there is a [facility] in the building and this was ordered for her post hospital.  I advised Alyse Low that I will need to see what I can do to facilitate this for the patient.  Christy voices understanding and is agreeable.   I did attempt to call the [facility] in [place] at [phone]. I had to leave a message on the triage line for someone to please call back to advise me if IV lasix can be given there. Waiting on a call back.

## 2021-05-06 NOTE — Telephone Encounter (Signed)
Call received back from [facility] Triage RN. She advised that they typically do not do IV lasix on patient's, but since Ms. Benavidez is a patient there, they have a done this for her in the past at an already existing appointment.  Per Hassan Rowan, the patient is due to come in next week for possible iron infusions. She inquired how soon the patient needed IV lasix, and I advised probably sooner rather than later due to lower extremity weeping.  Per Hassan Rowan, the patient is going to be followed now by Dr. Tasia Catchings. She will have to forward a message to Dr. Tasia Catchings to see if she will agree to this.  Hassan Rowan advised she will need to call back.

## 2021-05-06 NOTE — Telephone Encounter (Signed)
Keep legs elevated with 2-3 pillows underneath at night Compression stockings during the day Increase torsemide to 60 mg in the morning and spironolactone to 150 mg for 3 days If the swelling does not improve, go back to the previous dose  [name], MD [address]  [place]  [place], [place] [address]  Main: [phone]  Fax: [phone] Pager: [phone]

## 2021-05-06 NOTE — Telephone Encounter (Signed)
Patient states she is not taking Torsemide she is taking Lasix 92m 2 tablets 2 times a day. She states she is taking the Spironolactone. She states Dr. ESaunders Revelis sending her to the cancer center to have blood work and IV Furosamide today. Dr. VMarius Ditchsaid to keep doing  what she is doing and if she gets worse then go to the ER this weekend. Patient verbalized understanding and states she will if she gets worse

## 2021-05-06 NOTE — Telephone Encounter (Signed)
Patient contacted and provided with an apt today in [facility] for [facility] lab and iv lasix.Marland Kitchen pt instructed to arrive at 115 pm today.  Dr. Rogue Bussing spoke with Dr. Saunders Revel and Dr. Marius Ditch. Proceed with 80 mg IV lasix push after labs are drawn - cbc, metc, mag  Pt instructed to keep all other follow-up apts with Dr. Tasia Catchings.

## 2021-05-06 NOTE — Telephone Encounter (Signed)
Heather from [facility] called requesting if patient can come in to get a dose of IV Lasix again in the [facility] office as she has in the past. Patient is seen for IDA and is scheduled for IV iron Wednesday the 30 th. She is having weeping edema and her cardiologist has said she can et IV lasix. Patient does not want to drive to [place] to get this done where there are protocol in place for this per [facility]. Please advise if patient can IV Lasix in [place]

## 2021-05-06 NOTE — Telephone Encounter (Signed)
Called and left a message for call back

## 2021-05-06 NOTE — Patient Instructions (Signed)
Check your blood pressures as directed daily, use compression stockings as directed. Please elevate legs as needed. Today you received IV lasix (80 mg).  Do not take toresemide or spirolactone this weekend. Check with Dr. Saunders Revel regarding your blood pressure readings and leg edema. What should I watch for while using this medication?   Visit your doctor or health care providerfor regular checks on your progress. Check your blood pressure regularly. Ask your doctor or health care providerwhat your blood pressure should be, and when you should contact him or her. Do not stand or sit up quickly, especially if you are an older patient. This reduces the risk of dizzy or fainting spells. Alcohol can make you more drowsy and dizzy.Avoid alcoholic drinks.

## 2021-05-06 NOTE — Addendum Note (Signed)
Addended by: Livia Snellen on: 05/06/2021 02:46 PM   Modules accepted: Orders, SmartSet

## 2021-05-09 DIAGNOSIS — K7581 Nonalcoholic steatohepatitis (NASH): Secondary | ICD-10-CM | POA: Diagnosis not present

## 2021-05-09 DIAGNOSIS — I82431 Acute embolism and thrombosis of right popliteal vein: Secondary | ICD-10-CM | POA: Diagnosis not present

## 2021-05-09 DIAGNOSIS — E1122 Type 2 diabetes mellitus with diabetic chronic kidney disease: Secondary | ICD-10-CM | POA: Diagnosis not present

## 2021-05-09 DIAGNOSIS — I82512 Chronic embolism and thrombosis of left femoral vein: Secondary | ICD-10-CM | POA: Diagnosis not present

## 2021-05-09 DIAGNOSIS — I2510 Atherosclerotic heart disease of native coronary artery without angina pectoris: Secondary | ICD-10-CM | POA: Diagnosis not present

## 2021-05-09 DIAGNOSIS — E1151 Type 2 diabetes mellitus with diabetic peripheral angiopathy without gangrene: Secondary | ICD-10-CM | POA: Diagnosis not present

## 2021-05-09 DIAGNOSIS — I110 Hypertensive heart disease with heart failure: Secondary | ICD-10-CM | POA: Diagnosis not present

## 2021-05-09 DIAGNOSIS — N184 Chronic kidney disease, stage 4 (severe): Secondary | ICD-10-CM | POA: Diagnosis not present

## 2021-05-09 DIAGNOSIS — I700 Atherosclerosis of aorta: Secondary | ICD-10-CM | POA: Diagnosis not present

## 2021-05-09 DIAGNOSIS — I5032 Chronic diastolic (congestive) heart failure: Secondary | ICD-10-CM | POA: Diagnosis not present

## 2021-05-09 NOTE — Progress Notes (Signed)
Follow-up Outpatient Visit Date: 05/11/2021  Primary Care Provider: Ileene Hutchinson, [place] [address]  Chief Complaint: Leg swelling  HPI:  Crystal [name] is a 70 y.o. female with history of chronic HFpEF, cirrhosis complicated by esophageal varices status post TIPS, peptic ulcer disease, type 2 diabetes mellitus, pulmonary nodules, iron deficiency anemia, and vitamin B12 deficiency, who presents for follow-up of HFpEF and chronic leg swelling.  I last saw her on 04/01/2021, at which time she complained of continued leg edema.  We made plans for right heart catheterization, though this was cancelled after the patient was admitted with confusion due to hepatic encephalopathy.  She was found to have acute right popliteal DVT and chronic left common femoral DVT.  She was started on apixaban by the hospitalist service but was readmitted with upper GI bleed ~3 weeks later.  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  Anticoagulation was held and an IVC filter placed.  Today, Crystal [name] reports that her leg swelling continues to worsen.  She She received furosemide 80 mg IV last week with improvement for 1 day.  She has been taking furosemide 80 mg PO BID at home but feels like this is not helping much.  Her legs continue to weep at times.  Her weight is increasing as well.  She has stable exertional dyspnea when walking from one room to another. She denies chest pain, palpitations, lightheadedness, and bleeding.  --------------------------------------------------------------------------------------------------  Past Medical History:  Diagnosis Date   Acute GI bleeding 02/09/2018   Acute upper gastrointestinal bleeding 08/03/2018   Anemia    TAKES IRON TAB   Arthritis    SHOULDER   B12 deficiency 02/18/2018   Bleeding    GI  3/19   Bronchitis    HX OF   CHF (congestive heart failure) (HCC)    Cirrhosis (HCC)    Colon cancer screening    Diabetes  mellitus without complication ([place])    TYPE 2   Fatty (change of) liver, not elsewhere classified    Full dentures    UPPER AND LOWER   Iron deficiency anemia due to chronic blood loss 02/18/2018   Lung nodule, multiple    Pernicious anemia 02/22/2018   PUD (peptic ulcer disease)    Raynaud's syndrome    Upper GI bleeding 08/03/2018   Past Surgical History:  Procedure Laterality Date   CATARACT EXTRACTION W/PHACO Right 02/06/2018   Procedure: CATARACT EXTRACTION PHACO AND INTRAOCULAR LENS PLACEMENT ([place]) RIGHT DIABETIC;  Surgeon: Leandrew Koyanagi, MD;  Location: [facility];  Service: Ophthalmology;  Laterality: Right;  DIABETIC, ORAL MED   CATARACT EXTRACTION W/PHACO Left 04/30/2018   Procedure: CATARACT EXTRACTION PHACO AND INTRAOCULAR LENS PLACEMENT ([facility]);  Surgeon: Leandrew Koyanagi, MD;  Location: [facility];  Service: Ophthalmology;  Laterality: Left;  [place] 01:04 AP% 21.3 CDE 13.66 Fluid pack lot # [id number]   COLONOSCOPY WITH PROPOFOL N/A 03/25/2018   Procedure: COLONOSCOPY WITH PROPOFOL;  Surgeon: Lin Landsman, MD;  Location: [facility] ENDOSCOPY;  Service: Gastroenterology;  Laterality: N/A;   DENTAL SURGERY     EXTRACTIONS   ELECTROMAGNETIC NAVIGATION BROCHOSCOPY Right 01/27/2019   Procedure: ELECTROMAGNETIC NAVIGATION BRONCHOSCOPY;  Surgeon: [name], MD;  Location: [facility];  Service: Cardiopulmonary;  Laterality: Right;   ENTEROSCOPY N/A 07/28/2020   Procedure: Push ENTEROSCOPY;  Surgeon: Lin Landsman, MD;  Location: [facility] ENDOSCOPY;  Service: Gastroenterology;  Laterality: N/A;   ESOPHAGOGASTRODUODENOSCOPY N/A 08/03/2018  Procedure: ESOPHAGOGASTRODUODENOSCOPY (EGD);  Surgeon: Lin Landsman, MD;  Location: [facility] ENDOSCOPY;  Service: Gastroenterology;  Laterality: N/A;   ESOPHAGOGASTRODUODENOSCOPY (EGD) WITH PROPOFOL N/A 02/10/2018   Procedure: ESOPHAGOGASTRODUODENOSCOPY (EGD) WITH PROPOFOL;  Surgeon: Jonathon Bellows, MD;  Location: [facility]OSCOPY;  Service:  Gastroenterology;  Laterality: N/A;   ESOPHAGOGASTRODUODENOSCOPY (EGD) WITH PROPOFOL N/A 03/25/2018   Procedure: ESOPHAGOGASTRODUODENOSCOPY (EGD) WITH PROPOFOL;  Surgeon: Lin Landsman, MD;  Location: [facility] ENDOSCOPY;  Service: Gastroenterology;  Laterality: N/A;   ESOPHAGOGASTRODUODENOSCOPY (EGD) WITH PROPOFOL N/A 04/22/2018   Procedure: ESOPHAGOGASTRODUODENOSCOPY (EGD) WITH PROPOFOL with band ligation;  Surgeon: Lin Landsman, MD;  Location: [place];  Service: Gastroenterology;  Laterality: N/A;   ESOPHAGOGASTRODUODENOSCOPY (EGD) WITH PROPOFOL N/A 06/24/2018   Procedure: ESOPHAGOGASTRODUODENOSCOPY (EGD) WITH PROPOFOL;  Surgeon: Lin Landsman, MD;  Location: [facility] ENDOSCOPY;  Service: Gastroenterology;  Laterality: N/A;   ESOPHAGOGASTRODUODENOSCOPY (EGD) WITH PROPOFOL N/A 11/19/2018   Procedure: ESOPHAGOGASTRODUODENOSCOPY (EGD) WITH PROPOFOL;  Surgeon: Lin Landsman, MD;  Location: [place];  Service: Gastroenterology;  Laterality: N/A;   ESOPHAGOGASTRODUODENOSCOPY (EGD) WITH PROPOFOL N/A 03/08/2020   Procedure: ESOPHAGOGASTRODUODENOSCOPY (EGD) WITH PROPOFOL;  Surgeon: Lin Landsman, MD;  Location: [facility] ENDOSCOPY;  Service: Gastroenterology;  Laterality: N/A;   ESOPHAGOGASTRODUODENOSCOPY (EGD) WITH PROPOFOL N/A 04/28/2021   Procedure: ESOPHAGOGASTRODUODENOSCOPY (EGD) WITH PROPOFOL;  Surgeon: Lin Landsman, MD;  Location: [facility] ENDOSCOPY;  Service: Gastroenterology;  Laterality: N/A;   EYE SURGERY     GIVENS CAPSULE STUDY N/A 03/29/2020   Procedure: GIVENS CAPSULE STUDY;  Surgeon: Lin Landsman, MD;  Location: [facility] ENDOSCOPY;  Service: Gastroenterology;  Laterality: N/A;   HEMORRHOID BANDING  03/25/2018   Procedure: HEMORRHOID BANDING;  Surgeon: Lin Landsman, MD;  Location: [facility] ENDOSCOPY;  Service: Gastroenterology;;   IR EMBO ART  VEN HEMORR LYMPH EXTRAV  INC GUIDE ROADMAPPING  08/07/2018   IR RADIOLOGIST EVAL & MGMT  09/03/2018   IR RADIOLOGIST  EVAL & MGMT  12/17/2018   IR RADIOLOGIST EVAL & MGMT  06/24/2019   IR RADIOLOGIST EVAL & MGMT  01/22/2020   IR RADIOLOGIST EVAL & MGMT  02/09/2021   IR TIPS  08/07/2018   IR TRANSHEPATIC PORTOGRAM W HEMO  03/07/2021   IR US GUIDE VASC ACCESS RIGHT  03/07/2021   IR VENO/JUGULAR LEFT  03/07/2021   IVC FILTER INSERTION N/A 04/28/2021   Procedure: IVC FILTER INSERTION;  Surgeon: Algernon Huxley, MD;  Location: [place] CV [facility];  Service: Cardiovascular;  Laterality: N/A;   PORTA CATH INSERTION N/A 07/30/2020   Procedure: PORTA CATH INSERTION;  Surgeon: Katha Cabal, MD;  Location: [place] [facility];  Service: Cardiovascular;  Laterality: N/A;   RADIOLOGY WITH ANESTHESIA N/A 08/07/2018   Procedure: TIPS;  Surgeon: Corrie Mckusick, DO;  Location: [place];  Service: Anesthesiology;  Laterality: N/A;   TONSILLECTOMY     Recent CV Pertinent Labs: Lab Results  Component Value Date   TRIG 163 (H) 08/03/2018   INR 1.3 (H) 04/27/2021   BNP 314.1 (H) 04/27/2021   K 3.7 05/11/2021   MG 1.8 05/06/2021   BUN 20 05/11/2021   BUN 25 02/01/2021   CREATININE 1.73 (H) 05/11/2021   CREATININE 2.20 (H) 02/09/2021    Past medical and surgical history were reviewed and updated in EPIC.  Current Meds  Medication Sig   benzonatate (TESSALON) 100 MG capsule Take 1 capsule (100 mg total) by mouth 4 (four) times daily as needed.   cyanocobalamin (,VITAMIN B-12,) 1000 MCG/ML injection Inject 1,000 mcg into  the muscle every 30 (thirty) days.    gabapentin (NEURONTIN) 100 MG capsule Take 2 capsules (200 mg total) by mouth 2 (two) times daily for 10 days.   hydrOXYzine (ATARAX/VISTARIL) 25 MG tablet Take 1 tablet (25 mg total) by mouth 2 (two) times daily as needed.   lactulose (CHRONULAC) 10 GM/15ML solution Take 30 mLs (20 g total) by mouth 3 (three) times daily.   metolazone (ZAROXOLYN) 5 MG tablet Take 1 tablet (5 mg total) by mouth in the morning.   Misc. Devices KIT Moderate compression hose 20-30 MM HG    ondansetron (ZOFRAN) 4 MG tablet Take 4 mg by mouth every 8 (eight) hours as needed for nausea or vomiting.    OneTouch Delica Lancets 09F MISC 2 (two) times daily.   ONETOUCH ULTRA test strip 2 (two) times daily.   pantoprazole (PROTONIX) 40 MG tablet Take 1 tablet (40 mg total) by mouth 2 (two) times daily.   sodium bicarbonate 650 MG tablet Take 1,300 mg by mouth 2 (two) times daily.   spironolactone (ALDACTONE) 100 MG tablet TAKE 1 TABLET BY MOUTH EVERY DAY   Tetrahydrozoline HCl (VISINE OP) Place 1 drop into both eyes daily as needed (irritaiton).   Vitamin D, Ergocalciferol, (DRISDOL) 1.25 MG (50000 UNIT) CAPS capsule Take 50,000 Units by mouth every Sunday.   XIFAXAN 550 MG TABS tablet Take 550 mg by mouth 2 (two) times daily.   [DISCONTINUED] potassium chloride (KLOR-CON) 10 MEQ tablet Take 15 mEq by mouth daily.   [DISCONTINUED] torsemide (DEMADEX) 20 MG tablet TAKE 2 TABLETS BY MOUTH 2 TIMES DAILY.    Allergies: Patient has no known allergies.  Social History   Tobacco Use   Smoking status: Never   Smokeless tobacco: Never  Vaping Use   Vaping Use: Never used  Substance Use Topics   Alcohol use: Not Currently    Comment: No EtOH for 30 years.  Never a heavy drinker.   Drug use: Never    Family History  Problem Relation Age of Onset   CAD Father    Heart attack Father 35   Cancer Maternal Uncle    Seizures Mother     Review of Systems: A 12-system review of systems was performed and was negative except as noted in the HPI.  --------------------------------------------------------------------------------------------------  Physical Exam: BP 120/60 (BP Location: Right Arm, Patient Position: Sitting, Cuff Size: Large)   Pulse 66   Ht 5' 2" (1.575 m)   Wt 233 lb (105.7 kg)   LMP  (LMP Unknown)   BMI 42.62 kg/m   General:  NAD. Neck: JVP difficult to assess due to body habitus. Lungs: Clear to auscultation bilaterally without wheezes or crackles. Heart:  Regular rate and rhythm without murmurs, rubs, or gallops. Abdomen: Soft, nontender, nondistended. Extremities: 2+ firm edema in both legs.  Left knee is wrapped. Fingertips are bluish-purple (patient reports this has been longstanding and related to her Raynaud's phenomenon).   EKG:  Normal sinus rhythm with low voltage and poor R wave progression.  QT interval has shortened slightly since 04/27/2021.  Otherwise, there has been no significant interval change.  Lab Results  Component Value Date   WBC 5.7 05/11/2021   HGB 8.1 (L) 05/11/2021   HCT 25.4 (L) 05/11/2021   MCV 83.6 05/11/2021   PLT 121 (L) 05/11/2021    Lab Results  Component Value Date   NA 128 (L) 05/11/2021   K 3.7 05/11/2021   CL 99 05/11/2021   CO2 24  05/11/2021   BUN 20 05/11/2021   CREATININE 1.73 (H) 05/11/2021   GLUCOSE 138 (H) 05/11/2021   ALT 10 05/06/2021    Lab Results  Component Value Date   TRIG 163 (H) 08/03/2018    --------------------------------------------------------------------------------------------------  ASSESSMENT AND PLAN: Acute on chronic HFpEF and lower extremity edema: Ms. Kindler continues to have worsening fluid retention with significant LE edema.  Her weight is also up 20 pounds over the last 10 days.  She is currently on furosemide 80 mg PO BID and spironolactone 100 mg daily.  We will augment this with metolazone 5 mg daily.  I will also have Ms. Falletta increase potassium chloride to 20 mEq BID.  If she does not notice improved urine output by tomorrow PM, she should contact us so that we can arrange for IV furosemide through the [facility] on Friday.  We will also reschedule RHC for next week.  We have discussed the risks and benefits of the procedure, and Ms. Askari is willing to move forward.  Femoral venous access is not an option at this time given her recent IVC filter placement in the setting of LE DVT's and subsequent GI bleed prompting discontinuation of anticoagulation.  Chronic  kidney disease stage IV: Creatinine stable today.  We will escalate diuresis, as above, and move forward with RHC.  I have asked Ms. Alexie to avoid [place] if at all possible given high-dose ASA.  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  Ongoing management per GI.  Avoid aspirin and anticoagulants at thsi time.  Cirrhosis: Escalate diuresis, as above.  Ongoing management per GI.  DVT: Acute and chronic DVT's noted, which are likely playing a role in BLE edema.  If RHC shows relatively normal intracardiac pressures, the patient may benefit from further evaluation by vascular surgery.  Follow-up: Return to clinic in 2-3 weeks.  Nelva Bush, MD 05/11/2021 4:13 PM

## 2021-05-09 NOTE — Telephone Encounter (Signed)
Crystal [name] with [facility] calling States that the treatment on Friday did nothing to help Patient is the same weight she was on Friday and still has swelling with weeping edema Nurse would like a high priority message sent back to discuss Please call [phone]

## 2021-05-09 NOTE — H&P (View-Only) (Signed)
Follow-up Outpatient Visit Date: 05/11/2021  Primary Care Provider: Ileene Hutchinson, [place] [id number]  Chief Complaint: Leg swelling  HPI:  Crystal [name] is a 70 y.o. female with history of chronic HFpEF, cirrhosis complicated by esophageal varices status post TIPS, peptic ulcer disease, type 2 diabetes mellitus, pulmonary nodules, iron deficiency anemia, and vitamin B12 deficiency, who presents for follow-up of HFpEF and chronic leg swelling.  I last saw her on 04/01/2021, at which time she complained of continued leg edema.  We made plans for right heart catheterization, though this was cancelled after the patient was admitted with confusion due to hepatic encephalopathy.  She was found to have acute right popliteal DVT and chronic left common femoral DVT.  She was started on apixaban by the hospitalist service but was readmitted with upper GI bleed ~3 weeks later.  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  Anticoagulation was held and an IVC filter placed.  Today, Crystal [name] reports that her leg swelling continues to worsen.  She She received furosemide 80 mg IV last week with improvement for 1 day.  She has been taking furosemide 80 mg PO BID at home but feels like this is not helping much.  Her legs continue to weep at times.  Her weight is increasing as well.  She has stable exertional dyspnea when walking from one room to another. She denies chest pain, palpitations, lightheadedness, and bleeding.  --------------------------------------------------------------------------------------------------  Past Medical History:  Diagnosis Date   Acute GI bleeding 02/09/2018   Acute upper gastrointestinal bleeding 08/03/2018   Anemia    TAKES IRON TAB   Arthritis    SHOULDER   B12 deficiency 02/18/2018   Bleeding    GI  3/19   Bronchitis    HX OF   CHF (congestive heart failure) (HCC)    Cirrhosis (HCC)    Colon cancer screening    Diabetes  mellitus without complication ([place])    TYPE 2   Fatty (change of) liver, not elsewhere classified    Full dentures    UPPER AND LOWER   Iron deficiency anemia due to chronic blood loss 02/18/2018   Lung nodule, multiple    Pernicious anemia 02/22/2018   PUD (peptic ulcer disease)    Raynaud's syndrome    Upper GI bleeding 08/03/2018   Past Surgical History:  Procedure Laterality Date   CATARACT EXTRACTION W/PHACO Right 02/06/2018   Procedure: CATARACT EXTRACTION PHACO AND INTRAOCULAR LENS PLACEMENT ([place]) RIGHT DIABETIC;  Surgeon: Leandrew Koyanagi, MD;  Location: [place];  Service: Ophthalmology;  Laterality: Right;  DIABETIC, ORAL MED   CATARACT EXTRACTION W/PHACO Left 04/30/2018   Procedure: CATARACT EXTRACTION PHACO AND INTRAOCULAR LENS PLACEMENT (IOC);  Surgeon: Leandrew Koyanagi, MD;  Location: [facility];  Service: Ophthalmology;  Laterality: Left;  [place] 01:04 AP% 21.3 CDE 13.66 Fluid pack lot # [id number]   COLONOSCOPY WITH PROPOFOL N/A 03/25/2018   Procedure: COLONOSCOPY WITH PROPOFOL;  Surgeon: Lin Landsman, MD;  Location: [facility] ENDOSCOPY;  Service: Gastroenterology;  Laterality: N/A;   DENTAL SURGERY     EXTRACTIONS   ELECTROMAGNETIC NAVIGATION BROCHOSCOPY Right 01/27/2019   Procedure: ELECTROMAGNETIC NAVIGATION BRONCHOSCOPY;  Surgeon: [name], MD;  Location: [facility];  Service: Cardiopulmonary;  Laterality: Right;   ENTEROSCOPY N/A 07/28/2020   Procedure: Push ENTEROSCOPY;  Surgeon: Lin Landsman, MD;  Location: [facility] ENDOSCOPY;  Service: Gastroenterology;  Laterality: N/A;   ESOPHAGOGASTRODUODENOSCOPY N/A 08/03/2018  Procedure: ESOPHAGOGASTRODUODENOSCOPY (EGD);  Surgeon: Lin Landsman, MD;  Location: [facility] ENDOSCOPY;  Service: Gastroenterology;  Laterality: N/A;   ESOPHAGOGASTRODUODENOSCOPY (EGD) WITH PROPOFOL N/A 02/10/2018   Procedure: ESOPHAGOGASTRODUODENOSCOPY (EGD) WITH PROPOFOL;  Surgeon: Jonathon Bellows, MD;  Location: [facility] ENDOSCOPY;  Service:  Gastroenterology;  Laterality: N/A;   ESOPHAGOGASTRODUODENOSCOPY (EGD) WITH PROPOFOL N/A 03/25/2018   Procedure: ESOPHAGOGASTRODUODENOSCOPY (EGD) WITH PROPOFOL;  Surgeon: Lin Landsman, MD;  Location: [facility] ENDOSCOPY;  Service: Gastroenterology;  Laterality: N/A;   ESOPHAGOGASTRODUODENOSCOPY (EGD) WITH PROPOFOL N/A 04/22/2018   Procedure: ESOPHAGOGASTRODUODENOSCOPY (EGD) WITH PROPOFOL with band ligation;  Surgeon: Lin Landsman, MD;  Location: [place];  Service: Gastroenterology;  Laterality: N/A;   ESOPHAGOGASTRODUODENOSCOPY (EGD) WITH PROPOFOL N/A 06/24/2018   Procedure: ESOPHAGOGASTRODUODENOSCOPY (EGD) WITH PROPOFOL;  Surgeon: Lin Landsman, MD;  Location: [facility] ENDOSCOPY;  Service: Gastroenterology;  Laterality: N/A;   ESOPHAGOGASTRODUODENOSCOPY (EGD) WITH PROPOFOL N/A 11/19/2018   Procedure: ESOPHAGOGASTRODUODENOSCOPY (EGD) WITH PROPOFOL;  Surgeon: Lin Landsman, MD;  Location: [place];  Service: Gastroenterology;  Laterality: N/A;   ESOPHAGOGASTRODUODENOSCOPY (EGD) WITH PROPOFOL N/A 03/08/2020   Procedure: ESOPHAGOGASTRODUODENOSCOPY (EGD) WITH PROPOFOL;  Surgeon: Lin Landsman, MD;  Location: [facility] ENDOSCOPY;  Service: Gastroenterology;  Laterality: N/A;   ESOPHAGOGASTRODUODENOSCOPY (EGD) WITH PROPOFOL N/A 04/28/2021   Procedure: ESOPHAGOGASTRODUODENOSCOPY (EGD) WITH PROPOFOL;  Surgeon: Lin Landsman, MD;  Location: [facility] ENDOSCOPY;  Service: Gastroenterology;  Laterality: N/A;   EYE SURGERY     GIVENS CAPSULE STUDY N/A 03/29/2020   Procedure: GIVENS CAPSULE STUDY;  Surgeon: Lin Landsman, MD;  Location: [facility] ENDOSCOPY;  Service: Gastroenterology;  Laterality: N/A;   HEMORRHOID BANDING  03/25/2018   Procedure: HEMORRHOID BANDING;  Surgeon: Lin Landsman, MD;  Location: [facility] ENDOSCOPY;  Service: Gastroenterology;;   IR EMBO ART  VEN HEMORR LYMPH EXTRAV  INC GUIDE ROADMAPPING  08/07/2018   IR RADIOLOGIST EVAL & MGMT  09/03/2018   IR RADIOLOGIST  EVAL & MGMT  12/17/2018   IR RADIOLOGIST EVAL & MGMT  06/24/2019   IR RADIOLOGIST EVAL & MGMT  01/22/2020   IR RADIOLOGIST EVAL & MGMT  02/09/2021   IR TIPS  08/07/2018   IR TRANSHEPATIC PORTOGRAM W HEMO  03/07/2021   IR US GUIDE VASC ACCESS RIGHT  03/07/2021   IR VENO/JUGULAR LEFT  03/07/2021   IVC FILTER INSERTION N/A 04/28/2021   Procedure: IVC FILTER INSERTION;  Surgeon: Algernon Huxley, MD;  Location: [place] CV LAB;  Service: Cardiovascular;  Laterality: N/A;   PORTA CATH INSERTION N/A 07/30/2020   Procedure: PORTA CATH INSERTION;  Surgeon: Katha Cabal, MD;  Location: [place] CV LAB;  Service: Cardiovascular;  Laterality: N/A;   RADIOLOGY WITH ANESTHESIA N/A 08/07/2018   Procedure: TIPS;  Surgeon: Corrie Mckusick, DO;  Location: [place];  Service: Anesthesiology;  Laterality: N/A;   TONSILLECTOMY     Recent CV Pertinent Labs: Lab Results  Component Value Date   TRIG 163 (H) 08/03/2018   INR 1.3 (H) 04/27/2021   BNP 314.1 (H) 04/27/2021   K 3.7 05/11/2021   MG 1.8 05/06/2021   BUN 20 05/11/2021   BUN 25 02/01/2021   CREATININE 1.73 (H) 05/11/2021   CREATININE 2.20 (H) 02/09/2021    Past medical and surgical history were reviewed and updated in EPIC.  Current Meds  Medication Sig   benzonatate (TESSALON) 100 MG capsule Take 1 capsule (100 mg total) by mouth 4 (four) times daily as needed.   cyanocobalamin (,VITAMIN B-12,) 1000 MCG/ML injection Inject 1,000 mcg into  the muscle every 30 (thirty) days.    gabapentin (NEURONTIN) 100 MG capsule Take 2 capsules (200 mg total) by mouth 2 (two) times daily for 10 days.   hydrOXYzine (ATARAX/VISTARIL) 25 MG tablet Take 1 tablet (25 mg total) by mouth 2 (two) times daily as needed.   lactulose (CHRONULAC) 10 GM/15ML solution Take 30 mLs (20 g total) by mouth 3 (three) times daily.   metolazone (ZAROXOLYN) 5 MG tablet Take 1 tablet (5 mg total) by mouth in the morning.   Misc. Devices KIT Moderate compression hose 20-30 MM HG    ondansetron (ZOFRAN) 4 MG tablet Take 4 mg by mouth every 8 (eight) hours as needed for nausea or vomiting.    OneTouch Delica Lancets 93X MISC 2 (two) times daily.   ONETOUCH ULTRA test strip 2 (two) times daily.   pantoprazole (PROTONIX) 40 MG tablet Take 1 tablet (40 mg total) by mouth 2 (two) times daily.   sodium bicarbonate 650 MG tablet Take 1,300 mg by mouth 2 (two) times daily.   spironolactone (ALDACTONE) 100 MG tablet TAKE 1 TABLET BY MOUTH EVERY DAY   Tetrahydrozoline HCl (VISINE OP) Place 1 drop into both eyes daily as needed (irritaiton).   Vitamin D, Ergocalciferol, (DRISDOL) 1.25 MG (50000 UNIT) CAPS capsule Take 50,000 Units by mouth every Sunday.   XIFAXAN 550 MG TABS tablet Take 550 mg by mouth 2 (two) times daily.   [DISCONTINUED] potassium chloride (KLOR-CON) 10 MEQ tablet Take 15 mEq by mouth daily.   [DISCONTINUED] torsemide (DEMADEX) 20 MG tablet TAKE 2 TABLETS BY MOUTH 2 TIMES DAILY.    Allergies: Patient has no known allergies.  Social History   Tobacco Use   Smoking status: Never   Smokeless tobacco: Never  Vaping Use   Vaping Use: Never used  Substance Use Topics   Alcohol use: Not Currently    Comment: No EtOH for 30 years.  Never a heavy drinker.   Drug use: Never    Family History  Problem Relation Age of Onset   CAD Father    Heart attack Father 8   Cancer Maternal Uncle    Seizures Mother     Review of Systems: A 12-system review of systems was performed and was negative except as noted in the HPI.  --------------------------------------------------------------------------------------------------  Physical Exam: BP 120/60 (BP Location: Right Arm, Patient Position: Sitting, Cuff Size: Large)   Pulse 66   Ht 5' 2" (1.575 m)   Wt 233 lb (105.7 kg)   LMP  (LMP Unknown)   BMI 42.62 kg/m   General:  NAD. Neck: JVP difficult to assess due to body habitus. Lungs: Clear to auscultation bilaterally without wheezes or crackles. Heart:  Regular rate and rhythm without murmurs, rubs, or gallops. Abdomen: Soft, nontender, nondistended. Extremities: 2+ firm edema in both legs.  Left knee is wrapped. Fingertips are bluish-purple (patient reports this has been longstanding and related to her Raynaud's phenomenon).   EKG:  Normal sinus rhythm with low voltage and poor R wave progression.  QT interval has shortened slightly since 04/27/2021.  Otherwise, there has been no significant interval change.  Lab Results  Component Value Date   WBC 5.7 05/11/2021   HGB 8.1 (L) 05/11/2021   HCT 25.4 (L) 05/11/2021   MCV 83.6 05/11/2021   PLT 121 (L) 05/11/2021    Lab Results  Component Value Date   NA 128 (L) 05/11/2021   K 3.7 05/11/2021   CL 99 05/11/2021   CO2 24  05/11/2021   BUN 20 05/11/2021   CREATININE 1.73 (H) 05/11/2021   GLUCOSE 138 (H) 05/11/2021   ALT 10 05/06/2021    Lab Results  Component Value Date   TRIG 163 (H) 08/03/2018    --------------------------------------------------------------------------------------------------  ASSESSMENT AND PLAN: Acute on chronic HFpEF and lower extremity edema: Crystal [name] continues to have worsening fluid retention with significant LE edema.  Her weight is also up 20 pounds over the last 10 days.  She is currently on furosemide 80 mg PO BID and spironolactone 100 mg daily.  We will augment this with metolazone 5 mg daily.  I will also have Crystal [name] increase potassium chloride to 20 mEq BID.  If she does not notice improved urine output by tomorrow PM, she should contact us so that we can arrange for IV furosemide through the [facility] on Friday.  We will also reschedule RHC for next week.  We have discussed the risks and benefits of the procedure, and Crystal [name] is willing to move forward.  Femoral venous access is not an option at this time given her recent IVC filter placement in the setting of LE DVT's and subsequent GI bleed prompting discontinuation of anticoagulation.  Chronic  kidney disease stage IV: Creatinine stable today.  We will escalate diuresis, as above, and move forward with RHC.  I have asked Crystal [name] to avoid [place] if at all possible given high-dose ASA.  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  Ongoing management per GI.  Avoid aspirin and anticoagulants at thsi time.  Cirrhosis: Escalate diuresis, as above.  Ongoing management per GI.  DVT: Acute and chronic DVT's noted, which are likely playing a role in BLE edema.  If RHC shows relatively normal intracardiac pressures, the patient may benefit from further evaluation by vascular surgery.  Follow-up: Return to clinic in 2-3 weeks.  Nelva Bush, MD 05/11/2021 4:13 PM

## 2021-05-09 NOTE — Telephone Encounter (Signed)
Alyse Low, RN made aware of Dr. Marya Landry response and recommendation with verbalized understanding. She will update the patient to the poc.

## 2021-05-09 NOTE — Telephone Encounter (Signed)
Spoke with the patients home health RN Alyse Low with [facility].  Patient was able to be given IV lasix at the cancer center in [facility] on Fri 05/06/21. They instructed her to hold her oral diuretics over the weekend. She resumed spironolactone and torsemide this morning.  Alyse Low reports that there is no change in the patients status. Patient has bilateral weeping edema in both legs up to her abdomen. Her lungs are clear but she has DOE with minimal activity.  Alyse Low feels that the patient is at a low threshold for hospital admission. Patient is scheduled to see Dr. Saunders Revel on 05/11/21. Legrand Como that I will fwd Dr. Saunders Revel the update to advise.  Christy ask to be updated with the poc.

## 2021-05-09 NOTE — Telephone Encounter (Signed)
I will follow-up with the patient as planned in 2 days in the office.  If her condition worsens in the meantime, she should be taken to the ED.  She should continue spironolactone and torsemide as prescribed.  Nelva Bush, MD [facility]

## 2021-05-11 ENCOUNTER — Inpatient Hospital Stay: Payer: Medicare HMO

## 2021-05-11 ENCOUNTER — Other Ambulatory Visit: Payer: Self-pay

## 2021-05-11 ENCOUNTER — Ambulatory Visit (INDEPENDENT_AMBULATORY_CARE_PROVIDER_SITE_OTHER): Payer: Medicare HMO | Admitting: Internal Medicine

## 2021-05-11 ENCOUNTER — Encounter: Payer: Self-pay | Admitting: Internal Medicine

## 2021-05-11 VITALS — BP 120/60 | HR 66 | Ht 62.0 in | Wt 233.0 lb

## 2021-05-11 DIAGNOSIS — E538 Deficiency of other specified B group vitamins: Secondary | ICD-10-CM | POA: Diagnosis not present

## 2021-05-11 DIAGNOSIS — E1122 Type 2 diabetes mellitus with diabetic chronic kidney disease: Secondary | ICD-10-CM | POA: Diagnosis not present

## 2021-05-11 DIAGNOSIS — K922 Gastrointestinal hemorrhage, unspecified: Secondary | ICD-10-CM | POA: Diagnosis not present

## 2021-05-11 DIAGNOSIS — D500 Iron deficiency anemia secondary to blood loss (chronic): Secondary | ICD-10-CM

## 2021-05-11 DIAGNOSIS — I509 Heart failure, unspecified: Secondary | ICD-10-CM | POA: Diagnosis not present

## 2021-05-11 DIAGNOSIS — K7469 Other cirrhosis of liver: Secondary | ICD-10-CM | POA: Diagnosis not present

## 2021-05-11 DIAGNOSIS — I5033 Acute on chronic diastolic (congestive) heart failure: Secondary | ICD-10-CM

## 2021-05-11 DIAGNOSIS — I998 Other disorder of circulatory system: Secondary | ICD-10-CM | POA: Insufficient documentation

## 2021-05-11 DIAGNOSIS — I82431 Acute embolism and thrombosis of right popliteal vein: Secondary | ICD-10-CM | POA: Diagnosis not present

## 2021-05-11 DIAGNOSIS — N184 Chronic kidney disease, stage 4 (severe): Secondary | ICD-10-CM

## 2021-05-11 DIAGNOSIS — K7460 Unspecified cirrhosis of liver: Secondary | ICD-10-CM | POA: Diagnosis not present

## 2021-05-11 DIAGNOSIS — R609 Edema, unspecified: Secondary | ICD-10-CM | POA: Diagnosis not present

## 2021-05-11 DIAGNOSIS — I824Z9 Acute embolism and thrombosis of unspecified deep veins of unspecified distal lower extremity: Secondary | ICD-10-CM

## 2021-05-11 DIAGNOSIS — Z95828 Presence of other vascular implants and grafts: Secondary | ICD-10-CM

## 2021-05-11 DIAGNOSIS — I82412 Acute embolism and thrombosis of left femoral vein: Secondary | ICD-10-CM | POA: Diagnosis not present

## 2021-05-11 DIAGNOSIS — R918 Other nonspecific abnormal finding of lung field: Secondary | ICD-10-CM | POA: Diagnosis not present

## 2021-05-11 LAB — BASIC METABOLIC PANEL
Anion gap: 5 (ref 5–15)
BUN: 20 mg/dL (ref 8–23)
CO2: 24 mmol/L (ref 22–32)
Calcium: 7.9 mg/dL — ABNORMAL LOW (ref 8.9–10.3)
Chloride: 99 mmol/L (ref 98–111)
Creatinine, Ser: 1.73 mg/dL — ABNORMAL HIGH (ref 0.44–1.00)
GFR, Estimated: 32 mL/min — ABNORMAL LOW (ref >60)
Glucose, Bld: 138 mg/dL — ABNORMAL HIGH (ref 70–99)
Potassium: 3.7 mmol/L (ref 3.5–5.1)
Sodium: 128 mmol/L — ABNORMAL LOW (ref 135–145)

## 2021-05-11 LAB — FERRITIN: Ferritin: 66 ng/mL (ref 11–307)

## 2021-05-11 LAB — IRON AND TIBC
Iron: 28 ug/dL (ref 28–170)
Saturation Ratios: 14 % (ref 10.4–31.8)
TIBC: 200 ug/dL — ABNORMAL LOW (ref 250–450)
UIBC: 172 ug/dL

## 2021-05-11 LAB — CBC WITH DIFFERENTIAL/PLATELET
Abs Immature Granulocytes: 0.03 10*3/uL (ref 0.00–0.07)
Basophils Absolute: 0 10*3/uL (ref 0.0–0.1)
Basophils Relative: 1 %
Eosinophils Absolute: 0.3 10*3/uL (ref 0.0–0.5)
Eosinophils Relative: 5 %
HCT: 25.4 % — ABNORMAL LOW (ref 36.0–46.0)
Hemoglobin: 8.1 g/dL — ABNORMAL LOW (ref 12.0–15.0)
Immature Granulocytes: 1 %
Lymphocytes Relative: 18 %
Lymphs Abs: 1 10*3/uL (ref 0.7–4.0)
MCH: 26.6 pg (ref 26.0–34.0)
MCHC: 31.9 g/dL (ref 30.0–36.0)
MCV: 83.6 fL (ref 80.0–100.0)
Monocytes Absolute: 0.7 10*3/uL (ref 0.1–1.0)
Monocytes Relative: 12 %
Neutro Abs: 3.6 10*3/uL (ref 1.7–7.7)
Neutrophils Relative %: 63 %
Platelets: 121 10*3/uL — ABNORMAL LOW (ref 150–400)
RBC: 3.04 MIL/uL — ABNORMAL LOW (ref 3.87–5.11)
RDW: 19.1 % — ABNORMAL HIGH (ref 11.5–15.5)
WBC: 5.7 10*3/uL (ref 4.0–10.5)
nRBC: 0 % (ref 0.0–0.2)

## 2021-05-11 MED ORDER — POTASSIUM CHLORIDE CRYS ER 20 MEQ PO TBCR: 20.0000 meq | EXTENDED_RELEASE_TABLET | Freq: Two times a day (BID) | ORAL | 3 refills | Status: DC

## 2021-05-11 MED ORDER — METOLAZONE 5 MG PO TABS: 5.0000 mg | ORAL_TABLET | Freq: Every morning | ORAL | 3 refills | Status: DC

## 2021-05-11 MED ORDER — HEPARIN SOD (PORK) LOCK FLUSH 100 UNIT/ML IV SOLN
500.0000 [IU] | Freq: Once | INTRAVENOUS | Status: AC
  Administered 2021-05-11: 500 [IU] via INTRAVENOUS
  Filled 2021-05-11: qty 5

## 2021-05-11 MED ORDER — SODIUM CHLORIDE 0.9% FLUSH
10.0000 mL | INTRAVENOUS | Status: DC | PRN
Start: 2021-05-11 — End: 2021-05-11
  Administered 2021-05-11: 10 mL via INTRAVENOUS
  Filled 2021-05-11: qty 10

## 2021-05-11 NOTE — Patient Instructions (Signed)
Medication Instructions:   Your physician has recommended you make the following change in your medication:   START Metolazone 68m every morning (take before your Furosemide)  CONTINUE Furosemide 881mTWICE daily  INCREASE Potassium 20 mEq TWICE daily     - If you have not noticed increase in urination after medication changes made today, please contact our office by Monday to let usKoreanow.    *If you need a refill on your cardiac medications before your next appointment, please call your pharmacy*   Lab Work:  None ordered  Testing/Procedures:   You are scheduled for a Cardiac Catheterization on Friday, July 8 with Dr. ChHarrell Gavend.  1. [facility] (This time is two hours before your procedure to ensure your preparation). Free valet parking service is available.   Special note: Every effort is made to have your procedure done on time. Please understand that emergencies sometimes delay scheduled procedures.  2. Diet: Do not eat solid foods after midnight.  The patient may have clear liquids until 5am upon the day of the procedure.  3. Labs: You had labs completed today.  4. Medication instructions in preparation for your procedure:   On the morning of your procedure, take all morning medicines.  You may use sips of water.  5. Plan for one night stay--bring personal belongings. 6. Bring a current list of your medications and current insurance cards. 7. You MUST have a responsible person to drive you home. 8. Someone MUST be with you the first 24 hours after you arrive home or your discharge will be delayed. 9. Please wear clothes that are easy to get on and off and wear slip-on shoes.  Thank you for allowing usKoreao care for you!   -- [facility]    Follow-Up: [facility] are our [facility].  As part of our continuing mission to provide you with exceptional heart  care, we have created designated Provider Care Teams.  These Care Teams include your primary Cardiologist (physician) and Advanced Practice Providers (APPs -  Physician Assistants and Nurse Practitioners) who all work together to provide you with the care you need, when you need it.  We recommend signing up for the patient portal called "MyChart".  Sign up information is provided on this After Visit Summary.  MyChart is used to connect with patients for Virtual Visits (Telemedicine).  Patients are able to view lab/test results, encounter notes, upcoming appointments, etc.  Non-urgent messages can be sent to your provider as well.   To learn more about what you can do with MyChart, go to [email or web address]   Your next appointment:   2 - 3 week(s)  The format for your next appointment:   In Person  Provider:   You may see ChNelva BushMD or one of the following Advanced Practice Providers on your designated Care Team:   ChMurray HodgkinsNP RyChristell FaithPA-C JaMarrianne MoodPA-C Cadence FuCrismanPAVermontaLaurann MontanaNP

## 2021-05-12 ENCOUNTER — Inpatient Hospital Stay (HOSPITAL_BASED_OUTPATIENT_CLINIC_OR_DEPARTMENT_OTHER): Payer: Medicare HMO | Admitting: Oncology

## 2021-05-12 ENCOUNTER — Encounter: Payer: Self-pay | Admitting: Oncology

## 2021-05-12 ENCOUNTER — Inpatient Hospital Stay: Payer: Medicare HMO

## 2021-05-12 VITALS — BP 117/50 | HR 69 | Temp 98.0°F | Resp 18 | Wt 236.1 lb

## 2021-05-12 VITALS — BP 119/69 | HR 64 | Resp 18

## 2021-05-12 DIAGNOSIS — R918 Other nonspecific abnormal finding of lung field: Secondary | ICD-10-CM

## 2021-05-12 DIAGNOSIS — R609 Edema, unspecified: Secondary | ICD-10-CM | POA: Diagnosis not present

## 2021-05-12 DIAGNOSIS — E538 Deficiency of other specified B group vitamins: Secondary | ICD-10-CM

## 2021-05-12 DIAGNOSIS — K7469 Other cirrhosis of liver: Secondary | ICD-10-CM | POA: Diagnosis not present

## 2021-05-12 DIAGNOSIS — D500 Iron deficiency anemia secondary to blood loss (chronic): Secondary | ICD-10-CM

## 2021-05-12 DIAGNOSIS — E1122 Type 2 diabetes mellitus with diabetic chronic kidney disease: Secondary | ICD-10-CM | POA: Diagnosis not present

## 2021-05-12 DIAGNOSIS — K7460 Unspecified cirrhosis of liver: Secondary | ICD-10-CM | POA: Diagnosis not present

## 2021-05-12 DIAGNOSIS — I509 Heart failure, unspecified: Secondary | ICD-10-CM | POA: Diagnosis not present

## 2021-05-12 DIAGNOSIS — K31819 Angiodysplasia of stomach and duodenum without bleeding: Secondary | ICD-10-CM

## 2021-05-12 DIAGNOSIS — I825Y3 Chronic embolism and thrombosis of unspecified deep veins of proximal lower extremity, bilateral: Secondary | ICD-10-CM

## 2021-05-12 DIAGNOSIS — K922 Gastrointestinal hemorrhage, unspecified: Secondary | ICD-10-CM | POA: Diagnosis not present

## 2021-05-12 DIAGNOSIS — I82431 Acute embolism and thrombosis of right popliteal vein: Secondary | ICD-10-CM | POA: Diagnosis not present

## 2021-05-12 DIAGNOSIS — Z95828 Presence of other vascular implants and grafts: Secondary | ICD-10-CM | POA: Diagnosis not present

## 2021-05-12 DIAGNOSIS — I82412 Acute embolism and thrombosis of left femoral vein: Secondary | ICD-10-CM | POA: Diagnosis not present

## 2021-05-12 MED ORDER — SODIUM CHLORIDE 0.9 % IV SOLN
Freq: Once | INTRAVENOUS | Status: AC
Start: 2021-05-12 — End: 2021-05-12
  Filled 2021-05-12: qty 250

## 2021-05-12 MED ORDER — IRON SUCROSE 20 MG/ML IV SOLN
200.0000 mg | Freq: Once | INTRAVENOUS | Status: AC
Start: 2021-05-12 — End: 2021-05-12
  Administered 2021-05-12: 200 mg via INTRAVENOUS
  Filled 2021-05-12: qty 10

## 2021-05-12 MED ORDER — HEPARIN SOD (PORK) LOCK FLUSH 100 UNIT/ML IV SOLN
500.0000 [IU] | Freq: Once | INTRAVENOUS | Status: AC
  Administered 2021-05-12: 500 [IU] via INTRAVENOUS
  Filled 2021-05-12: qty 5

## 2021-05-12 NOTE — Progress Notes (Signed)
[facility]  [address] [place], [place] [address] Phone: [phone]  Fax: [phone]   Clinic Day:  05/12/2021  Referring physician: Martin Majestic, *  Chief Complaint: Crystal [name] is a 70 y.o. female presents for follow-up of decompensated cirrhosis s/p TIPS procedure, right sided pulmonary nodules ,Iron deficiency  PERTINENT HEMATOLOGY HISTORY  Patient previously followed up by Dr.Corcoran, patient switched care to me on 05/12/21 Extensive medical record review was performed by me  # Right lower lobe pulmonary nodule.  She denies any smoking history. 02/09/2018 Abdomen and pelvic CT  revealed an 1.8 cm spiculated density in right lower lobe concerning for malignancy. There was mildly nodular hepatic contour concerning for hepatic cirrhosis.  There was moderate splenomegaly suggesting portal venous hypertension.  Mild ascites was noted.  There were mildly enlarged retroperitoneal lymph nodes (largest 1.1 cm) concerning for possible metastatic disease or malignancy.  There was a 7 cm left ovarian cyst.  Further evaluation with MRI was recommended to evaluate for possible neoplasm.  CA125 was 351.3 and CEA 1.1 on 02/21/2018.    02/02/2020 CT revealed stable nodules within the right middle and right lower lobes. 02/01/2021 revealed an increase nodule along the minor fissure in the right chest (elongated). RLL nodule measuring 2.4 x 1.6 cm.  02/23/2021 PET scan on was personally reviewed.  slowly enlarging right lung masses demonstrated only low level hypermetabolic activity, similar to remote PET-CT from 3 years ago.   Continue CT surveillance  #Low-level hypermetabolic activity in the left nasopharyngeal without associated focal lesion on CT imaging.  Patient declined ENT evaluation. . # Decompensated cirrhosis s/p TIPS, hepatitis C,  EGD on 02/10/2018 revealed 5 columns of grade III esophageal varices in the lower third of the esophagus.   There was stigmata of recent bleeding and red wale signs.  She underwent variceal ligation x 10.   She required 3 units of PRBCs.   EGD on 03/25/2018 revealed 2 angiectasias (non-bleeding) in the second portion of the duodenum, and a few diminutive (non-bleeding) angiectasias in the prepyloric region of the stomach that were treated APC. There were 4 non-bleeding ulcers (Forrest Class III)  found in the gastric fundus. There were 4 columns of large non-bleeding varices in the lower third of the esophagus, of which demonstrated no stigmata of bleeding.  Varices were banded.  EGD on 04/22/2018  revealed one non-bleeding cratered gastric ulcer (Forrest Class III) in the lesser curvature of the gastric body. Duodenal bulb and second portion of the duodenum were normal. Moderate portal hypertensive gastropathy noted in the stomach. Large varices in the lower third of the esophagus noted. 2 bands were placed with incomplete eradication of the lesions. EGD on 03/08/2020 revealed a few non-bleeding angioectasias in the duodenum and a gastric antral vascular ectasia with bleeding treated with argon plasma coagulation (APC).  There was a single non-bleeding angioectasia in the stomach treated with bipolar cautery. Gastroesophageal junction and esophagus were normal. No specimens were collected.  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 Colonoscopy on 03/25/2018 revealed a single 5 mm sessile polyp in the ascending colon. Patient has rectal varices and external hemorrhoids. Pathology returned as tubular adenoma, and was negative for high grade dysplasia and malignancy.     #Iron deficiency anemia  status post multiple IV Venofer treatments.  #  Acute right popliteal DVT and a chronic left  common femoral DVT DVT.  Patient was started on Eliquis which she did not tolerate and developed upper GI bleeding within 2 to 3 weeks of anticoagulation..  Eliquis was discontinued.  Patient has IVC filter.  #CHF and chronic lower extremity edema.  INTERVAL HISTORY Crystal [name] is a 70 y.o. female who has above history reviewed by me today presents for follow up visit for management of lung nodule and iron deficiency anemia, cirrhosis Problems and complaints are listed below:  04/27/2021 - 05/01/2021, patient was hospitalized at [facility] due to dark stool. 04/28/2021, EGD showed a single nonbleeding angiectasia in the stomach treated with APC.  Small bowel enteroscopy was initially planned however canceled due to fever during admission.  Fever was considered to be secondary to presumed aspiration pneumonia post endoscopy.  Patient was treated with antibiotics.  Today patient reports feeling tired and fatigued.  She has noticed worsening lower extremity edema and weight gain and was restarted on torsemide.  She also got 1 dose of IV Lasix 80 mg at [facility] per Dr. Darnelle Bos recommendation. She reports swelling slightly improved since the start of torsemide.    Past Medical History:  Diagnosis Date   Acute GI bleeding 02/09/2018   Acute upper gastrointestinal bleeding 08/03/2018   Anemia    TAKES IRON TAB   Arthritis    SHOULDER   B12 deficiency 02/18/2018   Bleeding    GI  3/19   Bronchitis    HX OF   CHF (congestive heart failure) (HCC)    Cirrhosis (HCC)    Colon cancer screening    Diabetes mellitus without complication ([place])    TYPE 2   Fatty (change of) liver, not elsewhere classified    Full dentures    UPPER AND LOWER   Iron deficiency anemia due to chronic blood loss 02/18/2018   Lung nodule, multiple    Pernicious anemia 02/22/2018   PUD (peptic ulcer disease)    Raynaud's syndrome    Upper GI bleeding 08/03/2018    Past Surgical History:  Procedure Laterality Date    CATARACT EXTRACTION W/PHACO Right 02/06/2018   Procedure: CATARACT EXTRACTION PHACO AND INTRAOCULAR LENS PLACEMENT ([place]) RIGHT DIABETIC;  Surgeon: Leandrew Koyanagi, MD;  Location: [place];  Service: Ophthalmology;  Laterality: Right;  DIABETIC, ORAL MED   CATARACT EXTRACTION W/PHACO Left 04/30/2018   Procedure: CATARACT EXTRACTION PHACO AND INTRAOCULAR LENS PLACEMENT ([facility]);  Surgeon: Leandrew Koyanagi, MD;  Location: [facility];  Service: Ophthalmology;  Laterality: Left;  [place] 01:04 AP% 21.3 CDE 13.66 Fluid pack lot # [id number]   COLONOSCOPY WITH PROPOFOL N/A 03/25/2018   Procedure: COLONOSCOPY WITH PROPOFOL;  Surgeon: Lin Landsman, MD;  Location: [facility] ENDOSCOPY;  Service: Gastroenterology;  Laterality: N/A;   DENTAL SURGERY     EXTRACTIONS   ELECTROMAGNETIC NAVIGATION BROCHOSCOPY Right 01/27/2019   Procedure: ELECTROMAGNETIC NAVIGATION BRONCHOSCOPY;  Surgeon: [name], MD;  Location: [facility];  Service: Cardiopulmonary;  Laterality: Right;   ENTEROSCOPY N/A 07/28/2020   Procedure: Push ENTEROSCOPY;  Surgeon: Lin Landsman, MD;  Location: [facility] ENDOSCOPY;  Service: Gastroenterology;  Laterality: N/A;   ESOPHAGOGASTRODUODENOSCOPY N/A 08/03/2018   Procedure: ESOPHAGOGASTRODUODENOSCOPY (EGD);  Surgeon: Lin Landsman, MD;  Location: [name] ENDOSCOPY;  Service: Gastroenterology;  Laterality: N/A;   ESOPHAGOGASTRODUODENOSCOPY (EGD) WITH PROPOFOL N/A 02/10/2018   Procedure: ESOPHAGOGASTRODUODENOSCOPY (EGD) WITH PROPOFOL;  Surgeon: Jonathon Bellows, MD;  Location: [facility] ENDOSCOPY;  Service: Gastroenterology;  Laterality: N/A;  ESOPHAGOGASTRODUODENOSCOPY (EGD) WITH PROPOFOL N/A 03/25/2018   Procedure: ESOPHAGOGASTRODUODENOSCOPY (EGD) WITH PROPOFOL;  Surgeon: Lin Landsman, MD;  Location: [place];  Service: Gastroenterology;  Laterality: N/A;   ESOPHAGOGASTRODUODENOSCOPY (EGD) WITH PROPOFOL N/A 04/22/2018   Procedure: ESOPHAGOGASTRODUODENOSCOPY (EGD) WITH PROPOFOL  with band ligation;  Surgeon: Lin Landsman, MD;  Location: [place];  Service: Gastroenterology;  Laterality: N/A;   ESOPHAGOGASTRODUODENOSCOPY (EGD) WITH PROPOFOL N/A 06/24/2018   Procedure: ESOPHAGOGASTRODUODENOSCOPY (EGD) WITH PROPOFOL;  Surgeon: Lin Landsman, MD;  Location: [facility] ENDOSCOPY;  Service: Gastroenterology;  Laterality: N/A;   ESOPHAGOGASTRODUODENOSCOPY (EGD) WITH PROPOFOL N/A 11/19/2018   Procedure: ESOPHAGOGASTRODUODENOSCOPY (EGD) WITH PROPOFOL;  Surgeon: Lin Landsman, MD;  Location: [place];  Service: Gastroenterology;  Laterality: N/A;   ESOPHAGOGASTRODUODENOSCOPY (EGD) WITH PROPOFOL N/A 03/08/2020   Procedure: ESOPHAGOGASTRODUODENOSCOPY (EGD) WITH PROPOFOL;  Surgeon: Lin Landsman, MD;  Location[facility] ENDOSCOPY;  Service: Gastroenterology;  Laterality: N/A;   ESOPHAGOGASTRODUODENOSCOPY (EGD) WITH PROPOFOL N/A 04/28/2021   Procedure: ESOPHAGOGASTRODUODENOSCOPY (EGD) WITH PROPOFOL;  Surgeon: Lin Landsman, MD;  Location: [facility] ENDOSCOPY;  Service: Gastroenterology;  Laterality: N/A;   EYE SURGERY     GIVENS CAPSULE STUDY N/A 03/29/2020   Procedure: GIVENS CAPSULE STUDY;  Surgeon: Lin Landsman, MD;  Location[facility] ENDOSCOPY;  Service: Gastroenterology;  Laterality: N/A;   HEMORRHOID BANDING  03/25/2018   Procedure: HEMORRHOID BANDING;  Surgeon: Lin Landsman, MD;  Location: [facility] ENDOSCOPY;  Service: Gastroenterology;;   IR EMBO ART  VEN HEMORR LYMPH EXTRAV  INC GUIDE ROADMAPPING  08/07/2018   IR RADIOLOGIST EVAL & MGMT  09/03/2018   IR RADIOLOGIST EVAL & MGMT  12/17/2018   IR RADIOLOGIST EVAL & MGMT  06/24/2019   IR RADIOLOGIST EVAL & MGMT  01/22/2020   IR RADIOLOGIST EVAL & MGMT  02/09/2021   IR TIPS  08/07/2018   IR TRANSHEPATIC PORTOGRAM W HEMO  03/07/2021   IR US GUIDE VASC ACCESS RIGHT  03/07/2021   IR VENO/JUGULAR LEFT  03/07/2021   IVC FILTER INSERTION N/A 04/28/2021   Procedure: IVC FILTER INSERTION;  Surgeon: Algernon Huxley, MD;   Location: [place] CV LAB;  Service: Cardiovascular;  Laterality: N/A;   PORTA CATH INSERTION N/A 07/30/2020   Procedure: PORTA CATH INSERTION;  Surgeon: Katha Cabal, MD;  Location: [place] CV LAB;  Service: Cardiovascular;  Laterality: N/A;   RADIOLOGY WITH ANESTHESIA N/A 08/07/2018   Procedure: TIPS;  Surgeon: Corrie Mckusick, DO;  Location: [place];  Service: Anesthesiology;  Laterality: N/A;   TONSILLECTOMY      Family History  Problem Relation Age of Onset   CAD Father    Heart attack Father 6   Cancer Maternal Uncle    Seizures Mother     Social History:  reports that she has never smoked. She has never used smokeless tobacco. She reports previous alcohol use. She reports that she does not use drugs. She retired from [employer] in 11/2017. She lives in [place]. The patient is alone today.  Allergies: No Known Allergies  Current Medications: Current Outpatient Medications  Medication Sig Dispense Refill   benzonatate (TESSALON) 100 MG capsule Take 1 capsule (100 mg total) by mouth 4 (four) times daily as needed. 20 capsule 0   cyanocobalamin (,VITAMIN B-12,) 1000 MCG/ML injection Inject 1,000 mcg into the muscle every 30 (thirty) days.      gabapentin (NEURONTIN) 100 MG capsule Take 2 capsules (200 mg total) by mouth 2 (two) times daily for 10 days. 40 capsule  0   lactulose (CHRONULAC) 10 GM/15ML solution Take 30 mLs (20 g total) by mouth 3 (three) times daily. 1800 mL 2   metolazone (ZAROXOLYN) 5 MG tablet Take 1 tablet (5 mg total) by mouth in the morning. 30 tablet 3   Misc. Devices KIT Moderate compression hose 20-30 MM HG 1 kit 0   OneTouch Delica Lancets 81E MISC 2 (two) times daily.     ONETOUCH ULTRA test strip 2 (two) times daily.     pantoprazole (PROTONIX) 40 MG tablet Take 1 tablet (40 mg total) by mouth 2 (two) times daily. 60 tablet 0   potassium chloride (KLOR-CON) 20 MEQ tablet Take 1 tablet (20 mEq total) by mouth 2 (two) times daily. 60 tablet 3    sodium bicarbonate 650 MG tablet Take 1,300 mg by mouth 2 (two) times daily.     spironolactone (ALDACTONE) 100 MG tablet TAKE 1 TABLET BY MOUTH EVERY DAY 30 tablet 2   Vitamin D, Ergocalciferol, (DRISDOL) 1.25 MG (50000 UNIT) CAPS capsule Take 50,000 Units by mouth every Sunday.     XIFAXAN 550 MG TABS tablet Take 550 mg by mouth 2 (two) times daily.     aspirin-sod bicarb-citric acid (ALKA-SELTZER) 325 MG TBEF tablet Take 650 mg by mouth every 6 (six) hours as needed (indigestion). (Patient not taking: No sig reported)     furosemide (LASIX) 80 MG tablet Take 80 mg by mouth 2 (two) times daily. (Patient not taking: Reported on 05/12/2021)     hydrOXYzine (ATARAX/VISTARIL) 25 MG tablet Take 1 tablet (25 mg total) by mouth 2 (two) times daily as needed. (Patient not taking: Reported on 05/12/2021) 30 tablet 0   ondansetron (ZOFRAN) 4 MG tablet Take 4 mg by mouth every 8 (eight) hours as needed for nausea or vomiting.  (Patient not taking: Reported on 05/12/2021)     Tetrahydrozoline HCl (VISINE OP) Place 1 drop into both eyes daily as needed (irritaiton). (Patient not taking: Reported on 05/12/2021)     No current facility-administered medications for this visit.   Facility-Administered Medications Ordered in Other Visits  Medication Dose Route Frequency Provider Last Rate Last Admin   sodium chloride flush (NS) 0.9 % injection 10 mL  10 mL Intravenous PRN Nolon Stalls C, MD   10 mL at 09/14/20 1320    Review of Systems  Constitutional:  Negative for chills, diaphoresis, fever, malaise/fatigue and weight loss (up 7 lbs, fluid).  HENT: Negative.  Negative for congestion, ear discharge, ear pain, hearing loss, nosebleeds, sinus pain, sore throat and tinnitus.   Eyes:  Negative for blurred vision.  Respiratory:  Positive for shortness of breath (with exertion). Negative for cough, hemoptysis and sputum production.   Cardiovascular:  Positive for orthopnea and leg swelling. Negative for chest  pain and palpitations.  Gastrointestinal:  Negative for abdominal pain, blood in stool, constipation, diarrhea, heartburn, melena, nausea and vomiting.       Fluid in abdomen.  Genitourinary: Negative.  Negative for dysuria, frequency, hematuria and urgency.  Musculoskeletal: Negative.  Negative for back pain, joint pain, myalgias and neck pain.  Skin: Negative.  Negative for itching and rash.  Neurological:  Negative for dizziness, sensory change, speech change, focal weakness, weakness and headaches.  Endo/Heme/Allergies: Negative.  Does not bruise/bleed easily.  Psychiatric/Behavioral: Negative.  Negative for depression and memory loss. The patient is not nervous/anxious and does not have insomnia.   All other systems reviewed and are negative. Performance status (ECOG): 1-2  Vital Signs Blood  pressure (!) 117/50, pulse 69, temperature 98 F (36.7 C), resp. rate 18, weight 236 lb 1.8 oz (107.1 kg), SpO2 97 %.   Physical Exam Vitals and nursing note reviewed.  Constitutional:      General: She is not in acute distress.    Appearance: She is not diaphoretic.  HENT:     Head: Normocephalic and atraumatic.     Mouth/Throat:     Mouth: Mucous membranes are moist.     Pharynx: Oropharynx is clear.  Eyes:     General: No scleral icterus.    Extraocular Movements: Extraocular movements intact.     Conjunctiva/sclera: Conjunctivae normal.     Pupils: Pupils are equal, round, and reactive to light.  Cardiovascular:     Rate and Rhythm: Normal rate and regular rhythm.     Heart sounds: Normal heart sounds. No murmur heard. Pulmonary:     Effort: Pulmonary effort is normal. No respiratory distress.     Breath sounds: Normal breath sounds. No wheezing or rales.  Chest:     Chest wall: No tenderness.  Breasts:    Right: No axillary adenopathy or supraclavicular adenopathy.     Left: No axillary adenopathy or supraclavicular adenopathy.  Abdominal:     General: Bowel sounds are normal.  There is no distension.     Palpations: Abdomen is soft. There is no mass.     Tenderness: There is no abdominal tenderness. There is no guarding or rebound.     Comments: Edema in flanks. Abdominal swelling.  Musculoskeletal:        General: No swelling or tenderness. Normal range of motion.     Cervical back: Normal range of motion and neck supple.     Right lower leg: Edema present.     Left lower leg: Edema present.  Lymphadenopathy:     Head:     Right side of head: No preauricular, posterior auricular or occipital adenopathy.     Left side of head: No preauricular, posterior auricular or occipital adenopathy.     Cervical: No cervical adenopathy.     Upper Body:     Right upper body: No supraclavicular or axillary adenopathy.     Left upper body: No supraclavicular or axillary adenopathy.     Lower Body: No right inguinal adenopathy. No left inguinal adenopathy.  Skin:    General: Skin is warm and dry.     Comments: Increase number of spider angiomas on chest and back.  Neurological:     Mental Status: She is alert and oriented to person, place, and time.  Psychiatric:        Behavior: Behavior normal.        Thought Content: Thought content normal.        Judgment: Judgment normal.   RADIOGRAPHIC STUDIES: I have personally reviewed the radiological images as listed and agreed with the findings in the report. CT Head Wo Contrast  Result Date: 04/04/2021 CLINICAL DATA:  Altered mental status. EXAM: CT HEAD WITHOUT CONTRAST TECHNIQUE: Contiguous axial images were obtained from the base of the skull through the vertex without intravenous contrast. COMPARISON:  August 20, 2019 FINDINGS: Brain: There is mild cerebral atrophy with widening of the extra-axial spaces and ventricular dilatation. There are areas of decreased attenuation within the white matter tracts of the supratentorial brain, consistent with microvascular disease changes. Vascular: No hyperdense vessel or unexpected  calcification. Skull: Normal. Negative for fracture or focal lesion. Sinuses/Orbits: No acute finding. Other: The study  is limited secondary to patient motion. IMPRESSION: Limited study, as described above, without and acute intracranial abnormality. Electronically Signed   By: Virgina Norfolk M.D.   On: 04/04/2021 01:10   US Abdomen Complete  Result Date: 04/29/2021 CLINICAL DATA:  Fever EXAM: ABDOMEN ULTRASOUND COMPLETE COMPARISON:  None. FINDINGS: Gallbladder: Within the gallbladder, there are echogenic foci which move and shadow consistent with cholelithiasis. Largest gallstone measures 12 mm in length. No evident gallbladder wall thickening or pericholecystic fluid. No sonographic Murphy sign noted by sonographer. Common bile duct: Diameter: The proximal common bile duct is prominent, measuring 9 mm. Portions of the extrahepatic biliary ducts obscured by gas. No intrahepatic biliary duct dilatation evident. Liver: No focal lesion identified. The liver contour is somewhat nodular. The liver echotexture is diffusely increased and rather coarse. Portal vein is patent on color Doppler imaging with normal direction of blood flow towards the liver. IVC: No abnormality visualized. Pancreas: No pancreatic mass or inflammatory focus. Spleen: Spleen measures 14.7 x 7.5 x 15.2 cm with a measured splenic volume of 741 cubic cm. No focal splenic lesions are evident. Right Kidney: Length: 9.9 cm. Echogenicity within normal limits. No mass or hydronephrosis visualized. Left Kidney: Length: 10.2 cm. Echogenicity within normal limits. No mass or hydronephrosis visualized. Abdominal aorta: No aneurysm visualized. Other findings: No evident ascites. IMPRESSION: 1. Cholelithiasis. No gallbladder wall thickening or pericholecystic fluid. 2. Prominence of the proximal common bile duct measuring 9 mm. No obstructing focus evident. Note that portions of the common bile duct obscured by gas. From an imaging standpoint, MRCP would  be the optimum imaging study of choice to further evaluate the biliary ductal system. 3. Liver has a nodular contour with a rather coarsened diffuse echogenicity pattern, findings indicative of a degree of hepatic cirrhosis. No focal liver lesions evident. Note that the sensitivity of ultrasound for detection of focal liver lesions is diminished significantly in this circumstance. 4.  Splenomegaly.  No focal splenic lesions. 5.  Study otherwise unremarkable. Electronically Signed   By: Lowella Grip III M.D.   On: 04/29/2021 12:52   IR Transhepatic Portogram W Hemo  Result Date: 03/07/2021 CLINICAL DATA:  70 year old female presents for a tips shunt check EXAM: ULTRASOUND-GUIDED ACCESS RIGHT JUGULAR VEIN PRESSURE MEASUREMENTS PORTAL VENOGRAM MEDICATIONS: NONE ANESTHESIA/SEDATION: 1.5 mg Versed, 50 mcg fentanyl Moderate Sedation Time:  35 minutes The patient was continuously monitored during the procedure by the interventional radiology nurse under my direct supervision. CONTRAST:  0 FLUOROSCOPY TIME:  Fluoroscopy Time: 3 minutes 48 seconds (58 mGy). COMPLICATIONS: None PROCEDURE: Informed written consent was obtained from the patient after a thorough discussion of the procedural risks, benefits and alternatives. All questions were addressed. Maximal Sterile Barrier Technique was utilized including caps, mask, sterile gowns, sterile gloves, sterile drape, hand hygiene and skin antiseptic. A timeout was performed prior to the initiation of the procedure. Ultrasound survey of the right neck region overlying the right IJ vein was performed with images stored and sent to PACs, confirming patency of the vessel. 1% lidocaine was used for local anesthesia. A micropuncture needle was then used access the right internal jugular vein under ultrasound. With venous blood flow returned, an .018 micro wire was passed through the needle, observed to enter the low IJ vein under fluoroscopy. The needle was removed, and a  micropuncture sheath was placed over the wire. The inner dilator and wire were removed, and an 035 Bentson wire was advanced under fluoroscopy into the IVC. The sheath was removed and  a standard 6 [place] vascular sheath was placed. The dilator was removed and the sheath was flushed. Pigtail catheter was then advanced on the Bentson wire into the IVC. The catheter was position at the L1 level. Pressure measurement was made at the IVC. The catheter was withdrawn under fluoroscopy into the right atrium. Pressure measurement was made at the right atrium. Bentson wire was then advanced through the pigtail catheter which was removed. Combination of a 40 cm Kumpe the catheter and the Bentson wire were used to navigate through the tips stent. Once the diagnostic catheter and the wire within the portal vein the diagnostic catheter was removed. The pigtail catheter was then advanced on the Bentson wire to the portal vein. Wire was removed and the catheter was position within the portal vein. Pressure measurement was performed in the portal vein. CO2 portal venogram was also performed. Pressures and the images were reviewed. All catheters and wires were then removed. Sheath was removed with manual pressure used for hemostasis. Patient tolerated the procedure well and remained hemodynamically stable throughout. No complications were encountered and no significant blood loss. FINDINGS: Recorded pressures: IVC: 15 mm mmHg Right atrium: 18/10 (13) mmHg Portal vein: 26 mmHg Absolute difference of greater than 10 between IVC and portal vein confirms portal hypertension IMPRESSION: Status post ultrasound-guided access right internal jugular vein for TIPS shunt study/pressure measurement, confirming functioning TIPS shunt and elevated right heart pressures. Signed, Dulcy Fanny. Dellia Nims, RPVI [facility] Electronically Signed   By: Corrie Mckusick D.O.   On: 03/07/2021 12:25    PERIPHERAL VASCULAR CATHETERIZATION  Result Date: 04/28/2021 See surgical note for result.  NM PET Image Initial (PI) Skull Base To Thigh  Result Date: 02/25/2021 CLINICAL DATA:  Initial treatment strategy for enlarging pulmonary nodules on chest CT. No given history of malignancy. History cirrhosis and congestive heart failure. EXAM: NUCLEAR MEDICINE PET SKULL BASE TO THIGH TECHNIQUE: 10.5 mCi F-18 FDG was injected intravenously. Full-ring PET imaging was performed from the skull base to thigh after the radiotracer. CT data was obtained and used for attenuation correction and anatomic localization. Fasting blood glucose: 104 mg/dl COMPARISON:  PET-CT 03/08/2018, abdominal MRI 11/12/2019 and chest CT 02/02/2020 and 02/01/2021. FINDINGS: Mediastinal blood pool activity: SUV max 2.3 NECK: No hypermetabolic cervical lymph nodes are identified.Minimal asymmetric metabolic activity in the left nasopharynx (SUV max 3.0) without focal abnormality on CT imaging. No other lesions of the pharyngeal mucosal space are identified. Incidental CT findings: Bilateral parotid calcifications are noted without residual hypermetabolic activity. Bilateral carotid atherosclerosis. CHEST: There are no hypermetabolic mediastinal, hilar or axillary lymph nodes. There are 2 right lung masses which have slowly grown since 2019. The anterior lesion which is noted in close proximity to the minor fissure currently measures 5.4 x 2.6 cm on image 97/3 and has an SUV max of 2.9. On 2019 PET-CT, this measured 2.5 x 1.8 cm and had an SUV max of 2.7. The lesion in the right lower lobe measures 2.7 x 2.1 cm on image 105/3 and has an SUV max of 2.3. Previously, this measured 1.6 cm maximally and had an SUV max of 2.1. No new nodules are seen. Incidental CT findings: Mild centrilobular emphysema. Atherosclerosis of the aorta, great vessels and coronary arteries. The heart is enlarged, and there is a small pericardial effusion. Right IJ  Port-A-Cath extends to the superior cavoatrial junction. ABDOMEN/PELVIS: There is no hypermetabolic activity within the liver, adrenal glands, spleen or pancreas. There  is no hypermetabolic nodal activity. Incidental CT findings: Patient has a TIPS shunt. Embolization coils are noted in the central mesentery. Moderate splenomegaly and cholelithiasis are noted. Low-density left adnexal lesion measures 5.1 cm on image 221/3, decreased in size from prior PET-CT at which time it measured up to 6.6 cm. No associated hypermetabolic activity. No ascites. SKELETON: There is no hypermetabolic activity to suggest osseous metastatic disease. Incidental CT findings: Diffuse edema throughout the subcutaneous fat, greatest in the abdomen and pelvis. No acute or focal osseous abnormalities. There are facet degenerative changes in the lower lumbar spine. There are subcutaneous calcifications in the lower back and posterior pelvis. IMPRESSION: 1. The slowly enlarging right lung masses demonstrate only low level hypermetabolic activity, similar to remote PET-CT from 3 years ago. This argues against a malignant process. Given the parotid calcifications, consider Sjogren syndrome and associated connective tissue disorder accounting for the pulmonary findings. Continued CT follow-up recommended. 2. Low-level hypermetabolic activity in the left nasopharynx without associated focal lesion on CT imaging. 3. No residual parotid hypermetabolic activity. 4. Stable chronic findings of cirrhosis and portal hypertension post TIPS. Electronically Signed   By: Richardean Sale M.D.   On: 02/25/2021 09:25   US Venous Img Lower Bilateral (DVT)  Result Date: 04/07/2021 CLINICAL DATA:  Initial evaluation for bilateral foot pain. EXAM: BILATERAL LOWER EXTREMITY VENOUS DOPPLER ULTRASOUND TECHNIQUE: Gray-scale sonography with graded compression, as well as color Doppler and duplex ultrasound were performed to evaluate the lower extremity deep venous  systems from the level of the common femoral vein and including the common femoral, femoral, profunda femoral, popliteal and calf veins including the posterior tibial, peroneal and gastrocnemius veins when visible. The superficial great saphenous vein was also interrogated. Spectral Doppler was utilized to evaluate flow at rest and with distal augmentation maneuvers in the common femoral, femoral and popliteal veins. COMPARISON:  Prior study from 03/30/2020 FINDINGS: RIGHT LOWER EXTREMITY Common Femoral Vein: No evidence of thrombus. Normal compressibility, respiratory phasicity and response to augmentation. Saphenofemoral Junction: No evidence of thrombus. Normal compressibility and flow on color Doppler imaging. Profunda Femoral Vein: No evidence of thrombus. Normal compressibility and flow on color Doppler imaging. Femoral Vein: No evidence of thrombus. Normal compressibility, respiratory phasicity and response to augmentation. Popliteal Vein: Hypoechoic material distending the right popliteal vein suspicious for thrombus. This is nonocclusive. Loss of normal compressibility. Calf Veins: Nonocclusive thrombus extends into the right posterior tibial and peroneal veins. Superficial Great Saphenous Vein: No evidence of thrombus. Normal compressibility. Venous Reflux:  None. Other Findings:  None. LEFT LOWER EXTREMITY Common Femoral Vein: Again seen is heterogeneous echogenic material positioned along the wall of the distal left common femoral vein, likely reflecting chronic thrombus. Luminal narrowing with decreased flow seen at this level. This is relatively similar in appearance from previous. The remainder of the visualized left common femoral vein appears widely patent. Saphenofemoral Junction: No evidence of thrombus. Normal compressibility and flow on color Doppler imaging. Profunda Femoral Vein: No evidence of thrombus. Normal compressibility and flow on color Doppler imaging. Femoral Vein: No evidence of  thrombus. Normal compressibility, respiratory phasicity and response to augmentation. Popliteal Vein: No evidence of thrombus. Normal compressibility, respiratory phasicity and response to augmentation. Calf Veins: No evidence of thrombus. Normal compressibility and flow on color Doppler imaging. Superficial Great Saphenous Vein: No evidence of thrombus. Normal compressibility. Venous Reflux:  None. Other Findings:  None. IMPRESSION: 1. Acute nonocclusive DVT involving the right popliteal vein extending into the veins of the right  calf. 2. 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 No other evidence for acute DVT within the left lower extremity. Electronically Signed   By: Jeannine Boga M.D.   On: 04/07/2021 03:34   IR Veno/Jugular Left  Result Date: 03/07/2021 CLINICAL DATA:  70 year old female presents for a tips shunt check EXAM: ULTRASOUND-GUIDED ACCESS RIGHT JUGULAR VEIN PRESSURE MEASUREMENTS PORTAL VENOGRAM MEDICATIONS: NONE ANESTHESIA/SEDATION: 1.5 mg Versed, 50 mcg fentanyl Moderate Sedation Time:  35 minutes The patient was continuously monitored during the procedure by the interventional radiology nurse under my direct supervision. CONTRAST:  0 FLUOROSCOPY TIME:  Fluoroscopy Time: 3 minutes 48 seconds (58 mGy). COMPLICATIONS: None PROCEDURE: Informed written consent was obtained from the patient after a thorough discussion of the procedural risks, benefits and alternatives. All questions were addressed. Maximal Sterile Barrier Technique was utilized including caps, mask, sterile gowns, sterile gloves, sterile drape, hand hygiene and skin antiseptic. A timeout was performed prior to the initiation of the procedure. Ultrasound survey of the right neck region overlying the right IJ vein was performed with images stored and sent to PACs, confirming patency of the vessel. 1% lidocaine was used for local  anesthesia. A micropuncture needle was then used access the right internal jugular vein under ultrasound. With venous blood flow returned, an .018 micro wire was passed through the needle, observed to enter the low IJ vein under fluoroscopy. The needle was removed, and a micropuncture sheath was placed over the wire. The inner dilator and wire were removed, and an 035 Bentson wire was advanced under fluoroscopy into the IVC. The sheath was removed and a standard 6 [place] vascular sheath was placed. The dilator was removed and the sheath was flushed. Pigtail catheter was then advanced on the Bentson wire into the IVC. The catheter was position at the L1 level. Pressure measurement was made at the IVC. The catheter was withdrawn under fluoroscopy into the right atrium. Pressure measurement was made at the right atrium. Bentson wire was then advanced through the pigtail catheter which was removed. Combination of a 40 cm Kumpe the catheter and the Bentson wire were used to navigate through the tips stent. Once the diagnostic catheter and the wire within the portal vein the diagnostic catheter was removed. The pigtail catheter was then advanced on the Bentson wire to the portal vein. Wire was removed and the catheter was position within the portal vein. Pressure measurement was performed in the portal vein. CO2 portal venogram was also performed. Pressures and the images were reviewed. All catheters and wires were then removed. Sheath was removed with manual pressure used for hemostasis. Patient tolerated the procedure well and remained hemodynamically stable throughout. No complications were encountered and no significant blood loss. FINDINGS: Recorded pressures: IVC: 15 mm mmHg Right atrium: 18/10 (13) mmHg Portal vein: 26 mmHg Absolute difference of greater than 10 between IVC and portal vein confirms portal hypertension IMPRESSION: Status post ultrasound-guided access right internal jugular vein for TIPS shunt  study/pressure measurement, confirming functioning TIPS shunt and elevated right heart pressures. Signed, Dulcy Fanny. Dellia Nims, RPVI [facility] Radiology Electronically Signed   By: Corrie Mckusick D.O.   On: 03/07/2021 12:25   IR US Guide Vasc Access Right  Result Date: 03/07/2021 CLINICAL DATA:  70 year old female presents for a tips shunt check EXAM: ULTRASOUND-GUIDED ACCESS RIGHT JUGULAR VEIN PRESSURE MEASUREMENTS PORTAL VENOGRAM MEDICATIONS: NONE ANESTHESIA/SEDATION: 1.5 mg Versed, 50  mcg fentanyl Moderate Sedation Time:  35 minutes The patient was continuously monitored during the procedure by the interventional radiology nurse under my direct supervision. CONTRAST:  0 FLUOROSCOPY TIME:  Fluoroscopy Time: 3 minutes 48 seconds (58 mGy). COMPLICATIONS: None PROCEDURE: Informed written consent was obtained from the patient after a thorough discussion of the procedural risks, benefits and alternatives. All questions were addressed. Maximal Sterile Barrier Technique was utilized including caps, mask, sterile gowns, sterile gloves, sterile drape, hand hygiene and skin antiseptic. A timeout was performed prior to the initiation of the procedure. Ultrasound survey of the right neck region overlying the right IJ vein was performed with images stored and sent to PACs, confirming patency of the vessel. 1% lidocaine was used for local anesthesia. A micropuncture needle was then used access the right internal jugular vein under ultrasound. With venous blood flow returned, an .018 micro wire was passed through the needle, observed to enter the low IJ vein under fluoroscopy. The needle was removed, and a micropuncture sheath was placed over the wire. The inner dilator and wire were removed, and an 035 Bentson wire was advanced under fluoroscopy into the IVC. The sheath was removed and a standard 6 [place] vascular sheath was placed. The dilator was removed and the sheath was  flushed. Pigtail catheter was then advanced on the Bentson wire into the IVC. The catheter was position at the L1 level. Pressure measurement was made at the IVC. The catheter was withdrawn under fluoroscopy into the right atrium. Pressure measurement was made at the right atrium. Bentson wire was then advanced through the pigtail catheter which was removed. Combination of a 40 cm Kumpe the catheter and the Bentson wire were used to navigate through the tips stent. Once the diagnostic catheter and the wire within the portal vein the diagnostic catheter was removed. The pigtail catheter was then advanced on the Bentson wire to the portal vein. Wire was removed and the catheter was position within the portal vein. Pressure measurement was performed in the portal vein. CO2 portal venogram was also performed. Pressures and the images were reviewed. All catheters and wires were then removed. Sheath was removed with manual pressure used for hemostasis. Patient tolerated the procedure well and remained hemodynamically stable throughout. No complications were encountered and no significant blood loss. FINDINGS: Recorded pressures: IVC: 15 mm mmHg Right atrium: 18/10 (13) mmHg Portal vein: 26 mmHg Absolute difference of greater than 10 between IVC and portal vein confirms portal hypertension IMPRESSION: Status post ultrasound-guided access right internal jugular vein for TIPS shunt study/pressure measurement, confirming functioning TIPS shunt and elevated right heart pressures. Signed, Dulcy Fanny. Dellia Nims, RPVI [facility] Radiology Electronically Signed   By: Corrie Mckusick D.O.   On: 03/07/2021 12:25   DG Chest Port 1 View  Result Date: 04/29/2021 CLINICAL DATA:  Fever EXAM: PORTABLE CHEST 1 VIEW COMPARISON:  December 03, 2020 FINDINGS: Port-A-Cath tip is in the superior vena cava. No pneumothorax. There is ill-defined airspace opacity in the right lower lobe. Lungs  elsewhere clear. Heart size and pulmonary vascularity are normal. No adenopathy. No bone lesions. Calcification noted in the left carotid artery. IMPRESSION: Airspace opacity consistent with pneumonia right lower lobe. Lungs elsewhere clear. Heart size normal. Port-A-Cath tip in superior vena cava. Left carotid artery calcification noted. Electronically Signed   By: Lowella Grip III M.D.   On: 04/29/2021 12:53    Laboratory studies CBC    Component Value Date/Time   WBC  5.7 05/11/2021 1401   RBC 3.04 (L) 05/11/2021 1401   HGB 8.1 (L) 05/11/2021 1401   HGB 5.5 (LL) 04/26/2021 1139   HCT 25.4 (L) 05/11/2021 1401   HCT 17.6 (LL) 04/26/2021 1139   PLT 121 (L) 05/11/2021 1401   PLT 126 (L) 04/26/2021 1139   MCV 83.6 05/11/2021 1401   MCV 83 04/26/2021 1139   MCH 26.6 05/11/2021 1401   MCHC 31.9 05/11/2021 1401   RDW 19.1 (H) 05/11/2021 1401   RDW 17.3 (H) 04/26/2021 1139   LYMPHSABS 1.0 05/11/2021 1401   LYMPHSABS 1.0 01/27/2020 1025   MONOABS 0.7 05/11/2021 1401   EOSABS 0.3 05/11/2021 1401   EOSABS 0.1 01/27/2020 1025   BASOSABS 0.0 05/11/2021 1401   BASOSABS 0.0 01/27/2020 1025      Chemistry      Component Value Date/Time   NA 128 (L) 05/11/2021 1401   NA 135 02/01/2021 0935   K 3.7 05/11/2021 1401   CL 99 05/11/2021 1401   CO2 24 05/11/2021 1401   BUN 20 05/11/2021 1401   BUN 25 02/01/2021 0935   CREATININE 1.73 (H) 05/11/2021 1401   CREATININE 2.20 (H) 02/09/2021 0000      Component Value Date/Time   CALCIUM 7.9 (L) 05/11/2021 1401   ALKPHOS 135 (H) 05/06/2021 1316   AST 26 05/06/2021 1316   ALT 10 05/06/2021 1316   BILITOT 1.2 05/06/2021 1316   BILITOT 1.4 (H) 01/27/2020 1025        Assessment and plan 1. Iron deficiency anemia due to chronic blood loss   2. B12 deficiency   3. Pulmonary nodules   4. Fluid retention   5. GAVE (gastric antral vascular ectasia)   6. 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 S/P TIPS (transjugular intrahepatic portosystemic  shunt)   8. 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 Recommend continue monitor radiographically. Plan repeat CT chest in 4 months.  #Iron deficiency anemia due to GI bleeding, anemia due to chronic kidney disease Labs reviewed and discussed with patient. Hemoglobin is 8.1, MCV 83.6. Iron panel showed saturation of 14 and a ferritin of 66 Recommend patient to proceed with IV Venofer weekly x2.  #Decompensated liver cirrhosis status post TIPS 04/29/2021, liver ultrasound showed no focal liver lesion.  Prominent proximal common bile duct 9 mm.  Cholelithiasis.  Splenomegaly with no splenic lesions. 09/14/2020 AFP 1.8. .  # B12 deficiency Patient receives B12 monthly (last 02/01/2021) Hold B12 injection.  04/07/2021, B12 level was at 1535  #Chronic lower extremity edema, secondary to HFpEF/cirrhosis Weight increased with fluid retention.  Continue torsemide. Patient receives IV Lasix at infusion center as needed per cardiology recommendation  #Lower extremity DVT, patient did not tolerate anticoagulation with Eliquis.  Status post IVC filter.  RTC in 3 months for MD assessment, labs (CBC, AFP, B12 ferritin, iron studies)   I discussed the assessment and treatment plan with the patient.  The patient was provided an opportunity to ask questions and all were answered.  The patient agreed with the plan and demonstrated an understanding of the instructions.  The patient was advised to call back if the symptoms worsen or if the condition fails to improve as anticipated.  We spent sufficient time to discuss many aspect of care, questions were answered to patient's satisfaction. A total of 40 minutes was spent on this visit.  With 20 minutes spent reviewing reviewing her chart (Epic and Care Everywhere) including notes, labs, and imaging studies. 20 minutes  counseling the patient on the diagnosis, management plans, and answered questions.   Earlie Server, MD, PhD 03/01/2021, 10:06 PM

## 2021-05-13 ENCOUNTER — Other Ambulatory Visit: Payer: Self-pay | Admitting: *Deleted

## 2021-05-13 NOTE — Patient Outreach (Signed)
[facility]) Care Management  05/13/2021  Crystal [name] [birth year] [id number]   Telephone Assessment  Covering for assigned Care Manager Raina Mina  Subjective: Successful outreach call to patient discussed feeling okay except that her legs are still bothering her. She discussed swelling in her legs making it difficulty in walking. She reports having weeping at lower legs and occasionally placing a dressing at area. . She denies any signs or symptoms of bleeding . Marland Kitchen She discussed recent visit with cardiology adjustment in medication adding metolazone prior to lasix dosing, she started medication on yesterday  and reports noting increase in urination.  Patient discussed plans for cardiac cath on next week  she plans to follow up call with Cardiology on 6/5 to update regarding urination and swelling since staritng medication.    Goals Addressed             This Visit's Progress    XNA:[id number] My Quality of Life   On track    Timeframe:  Long-Range Goal Priority:  Medium Start Date:  04/18/2021                           Expected End Date: 08/12/2021                      Follow Up Date by 06/18/21    - do one enjoyable thing every day - do something different, like talking to a new person or going to a new place, every day - name a health care proxy (decision maker) - spend time outdoors at least 3 times a week   Barriers: Health Behaviors  Why is this important?   Having a long-term illness can be scary.  It can also be stressful for you and your caregiver.  These steps may help.    Notes:  05/13/21  Patient requested to resent Advanced directive packet , she is unable to locate.  She continues to have home health RN and physical therapy visit . Reinforced safety using rolling walker.  6/6 Encourage pt to completed the A.D she already has but has not completed and education on POA/HCPOA related. Encouraged pt to enjoy her outings however limited with HHPT/RN  involved at this time for the next few weeks before services will end with both. Pt states she is doing well however experiences some pain upon ambulating to both legs and takes prescribed pain medication when needed mostly at night but all is tolerable. Pt encouraged to wear her again compression stockings to prevent ongoing swelling that limits her ability to ambulate.       RKY:HCWCB and Manage Symptoms-Heart Failure   On track    Timeframe:  Short-Term Goal Priority:  Medium Start Date:   04/18/2021                          Expected End Date:   06/10/2021                   Follow Up Date by 86/2022   - begin a heart failure diary - bring diary to all appointments - develop a rescue plan - follow rescue plan if symptoms flare-up - know when to call the doctor - track symptoms and what helps feel better or worse   Barriers: Health Behaviors Knowledge  Why is this important?   You will be able to handle your symptoms better  if you keep track of them.  Making some simple changes to your lifestyle will help.  Eating healthy is one thing you can do to take good care of yourself.    Notes:  05/13/21 Patient verbalized understanding of recent medication changes, how to take metolazone beginning on 05/12/21. Reviewed recent weights for the last 2 days 234 . She verbalized worsening symptoms to notify MD of . She reports increased urinating since starting metolazone and plans to follow with call to office on 7/5.Reinforced continued daily monitoring and keeping a log .  6/6- Pt verbalized an understanding via teach back on HF symptoms and what to do if acute symptoms should occur by contact her heart doctor related to 3lbs overnight and 5lbs with one week. Verified pdg appointments and transportation availability. Will send [facility] calendar and education information packet and encouraged pt to document all weights for providers to view on her upcoming appointments.       THN;Make and Keep All  Appointments   On track    Timeframe:  Short-Term Goal Priority:  Medium Start Date:   6/6//2022                          Expected End Date: 06/10/2021                      Follow Up Date  by 06/18/21   - arrange a ride through an agency 1 week before appointment - ask family or friend for a ride - call to cancel if needed - keep a calendar with prescription refill dates - keep a calendar with appointment dates   Barriers: Health Behaviors  Why is this important?   Part of staying healthy is seeing the doctor for follow-up care.  If you forget your appointments, there are some things you can do to stay on track.    Notes:  05/13/21  Recent visit with cardiology , plans for cardiac cath in the next week. She has family support for transportation to appointment .  Verbalized having copy of After visit summary with discharge instruction and reviewed. She reports still driving self to some appointments as able. She discussed recent iron infusion on 6/30 and plans to call in next week to verify if okay to received iron infusion prior to heart catheterization.  6/20: Recently hospitalization with GI bleed. Plan of care adjusted with recent diagnosis added for follow up. Pt has verified understanding of all goal and interventions accordingly with review of her d/c instructions. Prefers monthly follow up calls for ongoing care management services verses weekly TOC calls.  6/6: Pt verified she has sufficient transportation to all her medical appointments with no delays or issues. Will discuss other options for transportation if needed for future transports to her provider appointments [place] but [facility] care-guides are able to assist if needed in the future.           Plan Patient agreeable to follow up visit in the next month.  Provided my contact number if new concerns sooner than next outreach from assigned care manager.    Joylene Draft, RN, BSN  [place] Management Coordinator  [phone]- Mobile [phone]- Toll Free Main Office  Joylene Draft, [place], BSN  [place] Management Coordinator  [phone]- Mobile [phone]- Toll Free Main Office

## 2021-05-17 ENCOUNTER — Other Ambulatory Visit (HOSPITAL_BASED_OUTPATIENT_CLINIC_OR_DEPARTMENT_OTHER): Payer: Self-pay | Admitting: Family

## 2021-05-17 ENCOUNTER — Other Ambulatory Visit: Payer: Self-pay | Admitting: Family

## 2021-05-17 DIAGNOSIS — E1122 Type 2 diabetes mellitus with diabetic chronic kidney disease: Secondary | ICD-10-CM | POA: Diagnosis not present

## 2021-05-17 DIAGNOSIS — I110 Hypertensive heart disease with heart failure: Secondary | ICD-10-CM | POA: Diagnosis not present

## 2021-05-17 DIAGNOSIS — I82512 Chronic embolism and thrombosis of left femoral vein: Secondary | ICD-10-CM | POA: Diagnosis not present

## 2021-05-17 DIAGNOSIS — I2510 Atherosclerotic heart disease of native coronary artery without angina pectoris: Secondary | ICD-10-CM | POA: Diagnosis not present

## 2021-05-17 DIAGNOSIS — I82431 Acute embolism and thrombosis of right popliteal vein: Secondary | ICD-10-CM | POA: Diagnosis not present

## 2021-05-17 DIAGNOSIS — N184 Chronic kidney disease, stage 4 (severe): Secondary | ICD-10-CM | POA: Diagnosis not present

## 2021-05-17 DIAGNOSIS — I700 Atherosclerosis of aorta: Secondary | ICD-10-CM | POA: Diagnosis not present

## 2021-05-17 DIAGNOSIS — I5032 Chronic diastolic (congestive) heart failure: Secondary | ICD-10-CM | POA: Diagnosis not present

## 2021-05-17 DIAGNOSIS — E1151 Type 2 diabetes mellitus with diabetic peripheral angiopathy without gangrene: Secondary | ICD-10-CM | POA: Diagnosis not present

## 2021-05-17 DIAGNOSIS — K922 Gastrointestinal hemorrhage, unspecified: Secondary | ICD-10-CM | POA: Diagnosis not present

## 2021-05-17 MED ORDER — FUROSEMIDE 80 MG PO TABS: 80.0000 mg | ORAL_TABLET | Freq: Two times a day (BID) | ORAL | 1 refills | Status: DC

## 2021-05-17 NOTE — Progress Notes (Signed)
Refill of lasix provided per request from pharmacy.   Loel Dubonnet, NP

## 2021-05-19 ENCOUNTER — Inpatient Hospital Stay: Payer: Medicare HMO | Attending: Oncology

## 2021-05-19 ENCOUNTER — Other Ambulatory Visit: Payer: Self-pay

## 2021-05-19 VITALS — BP 118/60 | HR 68 | Temp 97.8°F | Resp 18

## 2021-05-19 DIAGNOSIS — K922 Gastrointestinal hemorrhage, unspecified: Secondary | ICD-10-CM | POA: Insufficient documentation

## 2021-05-19 DIAGNOSIS — D500 Iron deficiency anemia secondary to blood loss (chronic): Secondary | ICD-10-CM

## 2021-05-19 DIAGNOSIS — K7210 Chronic hepatic failure without coma: Secondary | ICD-10-CM | POA: Diagnosis not present

## 2021-05-19 DIAGNOSIS — E119 Type 2 diabetes mellitus without complications: Secondary | ICD-10-CM

## 2021-05-19 DIAGNOSIS — I509 Heart failure, unspecified: Secondary | ICD-10-CM | POA: Diagnosis not present

## 2021-05-19 DIAGNOSIS — E1122 Type 2 diabetes mellitus with diabetic chronic kidney disease: Secondary | ICD-10-CM | POA: Diagnosis not present

## 2021-05-19 DIAGNOSIS — E538 Deficiency of other specified B group vitamins: Secondary | ICD-10-CM

## 2021-05-19 DIAGNOSIS — K7469 Other cirrhosis of liver: Secondary | ICD-10-CM

## 2021-05-19 DIAGNOSIS — Z95828 Presence of other vascular implants and grafts: Secondary | ICD-10-CM

## 2021-05-19 DIAGNOSIS — R601 Generalized edema: Secondary | ICD-10-CM | POA: Diagnosis not present

## 2021-05-19 DIAGNOSIS — N184 Chronic kidney disease, stage 4 (severe): Secondary | ICD-10-CM

## 2021-05-19 LAB — CBC WITH DIFFERENTIAL/PLATELET
Abs Immature Granulocytes: 0.01 10*3/uL (ref 0.00–0.07)
Basophils Absolute: 0 10*3/uL (ref 0.0–0.1)
Basophils Relative: 1 %
Eosinophils Absolute: 0.3 10*3/uL (ref 0.0–0.5)
Eosinophils Relative: 6 %
HCT: 25.5 % — ABNORMAL LOW (ref 36.0–46.0)
Hemoglobin: 8.4 g/dL — ABNORMAL LOW (ref 12.0–15.0)
Immature Granulocytes: 0 %
Lymphocytes Relative: 23 %
Lymphs Abs: 1 10*3/uL (ref 0.7–4.0)
MCH: 27 pg (ref 26.0–34.0)
MCHC: 32.9 g/dL (ref 30.0–36.0)
MCV: 82 fL (ref 80.0–100.0)
Monocytes Absolute: 0.6 10*3/uL (ref 0.1–1.0)
Monocytes Relative: 15 %
Neutro Abs: 2.4 10*3/uL (ref 1.7–7.7)
Neutrophils Relative %: 55 %
Platelets: 133 10*3/uL — ABNORMAL LOW (ref 150–400)
RBC: 3.11 MIL/uL — ABNORMAL LOW (ref 3.87–5.11)
RDW: 18.7 % — ABNORMAL HIGH (ref 11.5–15.5)
WBC: 4.3 10*3/uL (ref 4.0–10.5)
nRBC: 0 % (ref 0.0–0.2)

## 2021-05-19 LAB — COMPREHENSIVE METABOLIC PANEL
ALT: 10 U/L (ref 0–44)
AST: 24 U/L (ref 15–41)
Albumin: 2 g/dL — ABNORMAL LOW (ref 3.5–5.0)
Alkaline Phosphatase: 127 U/L — ABNORMAL HIGH (ref 38–126)
Anion gap: 10 (ref 5–15)
BUN: 28 mg/dL — ABNORMAL HIGH (ref 8–23)
CO2: 30 mmol/L (ref 22–32)
Calcium: 8.6 mg/dL — ABNORMAL LOW (ref 8.9–10.3)
Chloride: 87 mmol/L — ABNORMAL LOW (ref 98–111)
Creatinine, Ser: 1.87 mg/dL — ABNORMAL HIGH (ref 0.44–1.00)
GFR, Estimated: 29 mL/min — ABNORMAL LOW (ref >60)
Glucose, Bld: 109 mg/dL — ABNORMAL HIGH (ref 70–99)
Potassium: 3.4 mmol/L — ABNORMAL LOW (ref 3.5–5.1)
Sodium: 127 mmol/L — ABNORMAL LOW (ref 135–145)
Total Bilirubin: 1.5 mg/dL — ABNORMAL HIGH (ref 0.3–1.2)
Total Protein: 5.5 g/dL — ABNORMAL LOW (ref 6.5–8.1)

## 2021-05-19 LAB — HEMOGLOBIN A1C
Hgb A1c MFr Bld: 5.6 % (ref 4.8–5.6)
Mean Plasma Glucose: 114.02 mg/dL

## 2021-05-19 LAB — VITAMIN B12: Vitamin B-12: 1152 pg/mL — ABNORMAL HIGH (ref 180–914)

## 2021-05-19 MED ORDER — SODIUM CHLORIDE 0.9 % IV SOLN
INTRAVENOUS | Status: DC | PRN
  Administered 2021-05-19: 250 mL via INTRAVENOUS
  Filled 2021-05-19: qty 250

## 2021-05-19 MED ORDER — HEPARIN SOD (PORK) LOCK FLUSH 100 UNIT/ML IV SOLN
500.0000 [IU] | Freq: Once | INTRAVENOUS | Status: AC
  Administered 2021-05-19: 500 [IU] via INTRAVENOUS
  Filled 2021-05-19: qty 5

## 2021-05-19 MED ORDER — IRON SUCROSE 20 MG/ML IV SOLN
200.0000 mg | Freq: Once | INTRAVENOUS | Status: AC
  Administered 2021-05-19: 200 mg via INTRAVENOUS

## 2021-05-19 MED ORDER — SODIUM CHLORIDE 0.9% FLUSH
10.0000 mL | INTRAVENOUS | Status: DC | PRN
  Administered 2021-05-19: 10 mL via INTRAVENOUS
  Filled 2021-05-19: qty 10

## 2021-05-20 ENCOUNTER — Other Ambulatory Visit: Payer: Self-pay

## 2021-05-20 ENCOUNTER — Encounter: Payer: Self-pay | Admitting: Internal Medicine

## 2021-05-20 ENCOUNTER — Ambulatory Visit (HOSPITAL_BASED_OUTPATIENT_CLINIC_OR_DEPARTMENT_OTHER)
Admission: RE | Admit: 2021-05-20 | Discharge: 2021-05-20 | Disposition: A | Discharge status: Home / Self Care | Payer: Medicare HMO | Source: Home / Self Care | Attending: Internal Medicine | Admitting: Internal Medicine

## 2021-05-20 ENCOUNTER — Encounter
Admission: RE | Disposition: A | Discharge status: Home / Self Care | Payer: Self-pay | Source: Home / Self Care | Attending: Internal Medicine

## 2021-05-20 DIAGNOSIS — Z79899 Other long term (current) drug therapy: Secondary | ICD-10-CM | POA: Insufficient documentation

## 2021-05-20 DIAGNOSIS — N184 Chronic kidney disease, stage 4 (severe): Secondary | ICD-10-CM | POA: Insufficient documentation

## 2021-05-20 DIAGNOSIS — S299XXA Unspecified injury of thorax, initial encounter: Secondary | ICD-10-CM | POA: Diagnosis not present

## 2021-05-20 DIAGNOSIS — I6529 Occlusion and stenosis of unspecified carotid artery: Secondary | ICD-10-CM | POA: Diagnosis not present

## 2021-05-20 DIAGNOSIS — Z8249 Family history of ischemic heart disease and other diseases of the circulatory system: Secondary | ICD-10-CM | POA: Insufficient documentation

## 2021-05-20 DIAGNOSIS — E1122 Type 2 diabetes mellitus with diabetic chronic kidney disease: Secondary | ICD-10-CM | POA: Insufficient documentation

## 2021-05-20 DIAGNOSIS — S72461A Displaced supracondylar fracture with intracondylar extension of lower end of right femur, initial encounter for closed fracture: Secondary | ICD-10-CM | POA: Diagnosis not present

## 2021-05-20 DIAGNOSIS — K7460 Unspecified cirrhosis of liver: Secondary | ICD-10-CM | POA: Insufficient documentation

## 2021-05-20 DIAGNOSIS — I509 Heart failure, unspecified: Secondary | ICD-10-CM | POA: Diagnosis not present

## 2021-05-20 DIAGNOSIS — Z86718 Personal history of other venous thrombosis and embolism: Secondary | ICD-10-CM | POA: Insufficient documentation

## 2021-05-20 DIAGNOSIS — R9082 White matter disease, unspecified: Secondary | ICD-10-CM | POA: Diagnosis not present

## 2021-05-20 DIAGNOSIS — S72451A Displaced supracondylar fracture without intracondylar extension of lower end of right femur, initial encounter for closed fracture: Secondary | ICD-10-CM | POA: Diagnosis not present

## 2021-05-20 DIAGNOSIS — M5020 Other cervical disc displacement, unspecified cervical region: Secondary | ICD-10-CM | POA: Diagnosis not present

## 2021-05-20 DIAGNOSIS — I110 Hypertensive heart disease with heart failure: Secondary | ICD-10-CM | POA: Diagnosis not present

## 2021-05-20 DIAGNOSIS — I672 Cerebral atherosclerosis: Secondary | ICD-10-CM | POA: Diagnosis not present

## 2021-05-20 DIAGNOSIS — I5033 Acute on chronic diastolic (congestive) heart failure: Secondary | ICD-10-CM | POA: Insufficient documentation

## 2021-05-20 DIAGNOSIS — M7989 Other specified soft tissue disorders: Secondary | ICD-10-CM | POA: Diagnosis not present

## 2021-05-20 DIAGNOSIS — R600 Localized edema: Secondary | ICD-10-CM | POA: Insufficient documentation

## 2021-05-20 DIAGNOSIS — Z8711 Personal history of peptic ulcer disease: Secondary | ICD-10-CM | POA: Insufficient documentation

## 2021-05-20 DIAGNOSIS — S72464A Nondisplaced supracondylar fracture with intracondylar extension of lower end of right femur, initial encounter for closed fracture: Secondary | ICD-10-CM | POA: Diagnosis not present

## 2021-05-20 DIAGNOSIS — R69 Illness, unspecified: Secondary | ICD-10-CM | POA: Diagnosis not present

## 2021-05-20 HISTORY — PX: RIGHT HEART CATH: CATH118263

## 2021-05-20 LAB — GLUCOSE, CAPILLARY
Glucose-Capillary: 88 mg/dL (ref 70–99)
Glucose-Capillary: 59 mg/dL — ABNORMAL LOW (ref 70–99)
Glucose-Capillary: 99 mg/dL (ref 70–99)

## 2021-05-20 SURGERY — RIGHT HEART CATH
Anesthesia: Moderate Sedation

## 2021-05-20 MED ORDER — SODIUM CHLORIDE 0.9% FLUSH: 3.0000 mL | INTRAVENOUS | Status: DC | PRN

## 2021-05-20 MED ORDER — HYDRALAZINE HCL 20 MG/ML IJ SOLN: 10.0000 mg | INTRAMUSCULAR | Status: DC | PRN

## 2021-05-20 MED ORDER — HEPARIN SOD (PORK) LOCK FLUSH 100 UNIT/ML IV SOLN
INTRAVENOUS | Status: AC
  Administered 2021-05-20: 500 [IU]
  Filled 2021-05-20: qty 5

## 2021-05-20 MED ORDER — ACETAMINOPHEN 325 MG PO TABS: 650.0000 mg | ORAL_TABLET | ORAL | Status: DC | PRN

## 2021-05-20 MED ORDER — DEXTROSE 50 % IV SOLN: 25.0000 mL | Freq: Once | INTRAVENOUS | Status: AC

## 2021-05-20 MED ORDER — SODIUM CHLORIDE 0.9 % IV SOLN: 250.0000 mL | INTRAVENOUS | Status: DC | PRN

## 2021-05-20 MED ORDER — HEPARIN SOD (PORK) LOCK FLUSH 100 UNIT/ML IV SOLN
INTRAVENOUS | Status: AC
  Filled 2021-05-20: qty 5

## 2021-05-20 MED ORDER — LIDOCAINE HCL (PF) 1 % IJ SOLN
INTRAMUSCULAR | Status: AC
  Filled 2021-05-20: qty 30

## 2021-05-20 MED ORDER — HEPARIN (PORCINE) IN NACL 1000-0.9 UT/500ML-% IV SOLN
INTRAVENOUS | Status: DC | PRN
  Administered 2021-05-20: 500 mL

## 2021-05-20 MED ORDER — DEXTROSE 50 % IV SOLN
INTRAVENOUS | Status: AC
  Administered 2021-05-20: 25 mL via INTRAVENOUS
  Filled 2021-05-20: qty 50

## 2021-05-20 MED ORDER — METOLAZONE 5 MG PO TABS: 5.0000 mg | ORAL_TABLET | ORAL | 3 refills | Status: DC

## 2021-05-20 MED ORDER — LIDOCAINE HCL (PF) 1 % IJ SOLN
INTRAMUSCULAR | Status: DC | PRN
Start: 2021-05-20 — End: 2021-05-20
  Administered 2021-05-20: 2 mL

## 2021-05-20 MED ORDER — SODIUM CHLORIDE 0.9% FLUSH: 3.0000 mL | Freq: Two times a day (BID) | INTRAVENOUS | Status: DC

## 2021-05-20 MED ORDER — ONDANSETRON HCL 4 MG/2ML IJ SOLN: 4.0000 mg | Freq: Four times a day (QID) | INTRAMUSCULAR | Status: DC | PRN

## 2021-05-20 MED ORDER — SODIUM CHLORIDE 0.9 % IV SOLN: INTRAVENOUS | Status: DC

## 2021-05-20 SURGICAL SUPPLY — 8 items
CATH BALLN WEDGE 5F 110CM (CATHETERS) IMPLANT
CATH SWAN GANZ 7F STRAIGHT (CATHETERS) ×2 IMPLANT
DRAPE BRACHIAL (DRAPES) ×2 IMPLANT
GLIDESHEATH SLENDER 7FR .021G (SHEATH) ×2 IMPLANT
GUIDEWIRE .025 260CM (WIRE) ×2 IMPLANT
KIT RIGHT HEART (MISCELLANEOUS) ×2 IMPLANT
PACK CARDIAC CATH (CUSTOM PROCEDURE TRAY) ×2 IMPLANT
SHEATH GLIDE SLENDER 4/5FR (SHEATH) IMPLANT

## 2021-05-20 NOTE — Discharge Instructions (Signed)
Your catheterization showed normal pressure in the left side of your heart and the lungs.  The right-sided heart pressures were mildly elevated.  These results suggest that your leg swelling is most likely due to non-heart issues like your liver and kidney disease.  In order to lessen the risk of dehydrating your kidneys, I suggest that you decrease metolazone to 5 mg every other day.  You should continue your current doses of furosemide and metolazone.  Please reach out to Dr. Marius Ditch to see if there are any additional changes to make from a liver standpoint.

## 2021-05-20 NOTE — OR Nursing (Signed)
Pt reports she was stuck 4 times yesterday and ended up having to go to cancer center for port access to get labs drawn. Discussed with Audrea Muscat (cath lab tech) ok to access port a cath for procedure NS order. Will defer IV access to [facility] for placement in cath lab due to difficult sticking.

## 2021-05-20 NOTE — Interval H&P Note (Signed)
History and Physical Interval Note:  05/20/2021 5:25 PM  Crystal [name]  has presented today for surgery, with the diagnosis of acute on chronic HFpEF.  The various methods of treatment have been discussed with the patient and family. After consideration of risks, benefits and other options for treatment, the patient has consented to  Procedure(s): RIGHT HEART CATH (N/A) as a surgical intervention.  The patient's history has been reviewed, patient examined, no change in status, stable for surgery.  I have reviewed the patient's chart and labs.  Questions were answered to the patient's satisfaction.     Indy Prestwood

## 2021-05-20 NOTE — OR Nursing (Signed)
Pt decided to leave due to extended wait, however due to change in pt load she agreed to return and have procedure completed instead of waiting a couple weeks. (Pts had eaten and port a cath deaccessed.)

## 2021-05-23 ENCOUNTER — Other Ambulatory Visit: Payer: Self-pay

## 2021-05-23 ENCOUNTER — Emergency Department: Payer: Medicare HMO

## 2021-05-23 ENCOUNTER — Inpatient Hospital Stay
Admission: EM | Admit: 2021-05-23 | Discharge: 2021-06-03 | DRG: 533 | Disposition: A | Discharge status: Skilled Nursing Facility | Payer: Medicare HMO | Attending: Student | Admitting: Student

## 2021-05-23 ENCOUNTER — Encounter: Payer: Self-pay | Admitting: Internal Medicine

## 2021-05-23 ENCOUNTER — Other Ambulatory Visit: Payer: Self-pay | Admitting: Family

## 2021-05-23 ENCOUNTER — Other Ambulatory Visit: Payer: Self-pay | Admitting: Gastroenterology

## 2021-05-23 DIAGNOSIS — S72451A Displaced supracondylar fracture without intracondylar extension of lower end of right femur, initial encounter for closed fracture: Secondary | ICD-10-CM | POA: Diagnosis not present

## 2021-05-23 DIAGNOSIS — W19XXXA Unspecified fall, initial encounter: Secondary | ICD-10-CM | POA: Diagnosis not present

## 2021-05-23 DIAGNOSIS — L89151 Pressure ulcer of sacral region, stage 1: Secondary | ICD-10-CM | POA: Diagnosis present

## 2021-05-23 DIAGNOSIS — K766 Portal hypertension: Secondary | ICD-10-CM | POA: Diagnosis present

## 2021-05-23 DIAGNOSIS — N184 Chronic kidney disease, stage 4 (severe): Secondary | ICD-10-CM | POA: Diagnosis not present

## 2021-05-23 DIAGNOSIS — D696 Thrombocytopenia, unspecified: Secondary | ICD-10-CM | POA: Diagnosis present

## 2021-05-23 DIAGNOSIS — N179 Acute kidney failure, unspecified: Secondary | ICD-10-CM | POA: Diagnosis not present

## 2021-05-23 DIAGNOSIS — W010XXA Fall on same level from slipping, tripping and stumbling without subsequent striking against object, initial encounter: Secondary | ICD-10-CM | POA: Diagnosis present

## 2021-05-23 DIAGNOSIS — Z20822 Contact with and (suspected) exposure to covid-19: Secondary | ICD-10-CM | POA: Diagnosis not present

## 2021-05-23 DIAGNOSIS — N170 Acute kidney failure with tubular necrosis: Secondary | ICD-10-CM | POA: Diagnosis present

## 2021-05-23 DIAGNOSIS — K7460 Unspecified cirrhosis of liver: Secondary | ICD-10-CM | POA: Diagnosis not present

## 2021-05-23 DIAGNOSIS — I700 Atherosclerosis of aorta: Secondary | ICD-10-CM | POA: Diagnosis not present

## 2021-05-23 DIAGNOSIS — M7989 Other specified soft tissue disorders: Secondary | ICD-10-CM | POA: Diagnosis not present

## 2021-05-23 DIAGNOSIS — Z86718 Personal history of other venous thrombosis and embolism: Secondary | ICD-10-CM

## 2021-05-23 DIAGNOSIS — S72464A Nondisplaced supracondylar fracture with intracondylar extension of lower end of right femur, initial encounter for closed fracture: Secondary | ICD-10-CM | POA: Diagnosis not present

## 2021-05-23 DIAGNOSIS — R609 Edema, unspecified: Secondary | ICD-10-CM

## 2021-05-23 DIAGNOSIS — E119 Type 2 diabetes mellitus without complications: Secondary | ICD-10-CM | POA: Diagnosis not present

## 2021-05-23 DIAGNOSIS — A419 Sepsis, unspecified organism: Secondary | ICD-10-CM

## 2021-05-23 DIAGNOSIS — Z95828 Presence of other vascular implants and grafts: Secondary | ICD-10-CM | POA: Diagnosis not present

## 2021-05-23 DIAGNOSIS — N2581 Secondary hyperparathyroidism of renal origin: Secondary | ICD-10-CM | POA: Diagnosis present

## 2021-05-23 DIAGNOSIS — I509 Heart failure, unspecified: Secondary | ICD-10-CM | POA: Diagnosis not present

## 2021-05-23 DIAGNOSIS — K7200 Acute and subacute hepatic failure without coma: Secondary | ICD-10-CM | POA: Diagnosis not present

## 2021-05-23 DIAGNOSIS — E1122 Type 2 diabetes mellitus with diabetic chronic kidney disease: Secondary | ICD-10-CM | POA: Diagnosis not present

## 2021-05-23 DIAGNOSIS — S72401A Unspecified fracture of lower end of right femur, initial encounter for closed fracture: Secondary | ICD-10-CM | POA: Diagnosis not present

## 2021-05-23 DIAGNOSIS — R5381 Other malaise: Secondary | ICD-10-CM | POA: Diagnosis not present

## 2021-05-23 DIAGNOSIS — Z515 Encounter for palliative care: Secondary | ICD-10-CM | POA: Diagnosis not present

## 2021-05-23 DIAGNOSIS — S72414D Nondisplaced unspecified condyle fracture of lower end of right femur, subsequent encounter for closed fracture with routine healing: Secondary | ICD-10-CM | POA: Diagnosis not present

## 2021-05-23 DIAGNOSIS — R4182 Altered mental status, unspecified: Secondary | ICD-10-CM | POA: Diagnosis not present

## 2021-05-23 DIAGNOSIS — Z79899 Other long term (current) drug therapy: Secondary | ICD-10-CM

## 2021-05-23 DIAGNOSIS — I672 Cerebral atherosclerosis: Secondary | ICD-10-CM | POA: Diagnosis not present

## 2021-05-23 DIAGNOSIS — L8990 Pressure ulcer of unspecified site, unspecified stage: Secondary | ICD-10-CM | POA: Insufficient documentation

## 2021-05-23 DIAGNOSIS — H1131 Conjunctival hemorrhage, right eye: Secondary | ICD-10-CM | POA: Diagnosis not present

## 2021-05-23 DIAGNOSIS — G9389 Other specified disorders of brain: Secondary | ICD-10-CM | POA: Diagnosis not present

## 2021-05-23 DIAGNOSIS — I10 Essential (primary) hypertension: Secondary | ICD-10-CM | POA: Diagnosis not present

## 2021-05-23 DIAGNOSIS — R9082 White matter disease, unspecified: Secondary | ICD-10-CM | POA: Diagnosis not present

## 2021-05-23 DIAGNOSIS — K7290 Hepatic failure, unspecified without coma: Secondary | ICD-10-CM | POA: Diagnosis not present

## 2021-05-23 DIAGNOSIS — E871 Hypo-osmolality and hyponatremia: Secondary | ICD-10-CM | POA: Diagnosis not present

## 2021-05-23 DIAGNOSIS — D631 Anemia in chronic kidney disease: Secondary | ICD-10-CM | POA: Diagnosis present

## 2021-05-23 DIAGNOSIS — E876 Hypokalemia: Secondary | ICD-10-CM | POA: Diagnosis present

## 2021-05-23 DIAGNOSIS — G928 Other toxic encephalopathy: Secondary | ICD-10-CM | POA: Diagnosis not present

## 2021-05-23 DIAGNOSIS — Z8249 Family history of ischemic heart disease and other diseases of the circulatory system: Secondary | ICD-10-CM

## 2021-05-23 DIAGNOSIS — Z7982 Long term (current) use of aspirin: Secondary | ICD-10-CM

## 2021-05-23 DIAGNOSIS — K7581 Nonalcoholic steatohepatitis (NASH): Secondary | ICD-10-CM | POA: Diagnosis not present

## 2021-05-23 DIAGNOSIS — I2510 Atherosclerotic heart disease of native coronary artery without angina pectoris: Secondary | ICD-10-CM | POA: Diagnosis not present

## 2021-05-23 DIAGNOSIS — R188 Other ascites: Secondary | ICD-10-CM

## 2021-05-23 DIAGNOSIS — S72461A Displaced supracondylar fracture with intracondylar extension of lower end of right femur, initial encounter for closed fracture: Principal | ICD-10-CM | POA: Diagnosis present

## 2021-05-23 DIAGNOSIS — L89892 Pressure ulcer of other site, stage 2: Secondary | ICD-10-CM | POA: Diagnosis present

## 2021-05-23 DIAGNOSIS — G9341 Metabolic encephalopathy: Secondary | ICD-10-CM | POA: Diagnosis not present

## 2021-05-23 DIAGNOSIS — R279 Unspecified lack of coordination: Secondary | ICD-10-CM | POA: Diagnosis not present

## 2021-05-23 DIAGNOSIS — N390 Urinary tract infection, site not specified: Secondary | ICD-10-CM | POA: Diagnosis present

## 2021-05-23 DIAGNOSIS — Z7984 Long term (current) use of oral hypoglycemic drugs: Secondary | ICD-10-CM

## 2021-05-23 DIAGNOSIS — I130 Hypertensive heart and chronic kidney disease with heart failure and stage 1 through stage 4 chronic kidney disease, or unspecified chronic kidney disease: Secondary | ICD-10-CM | POA: Diagnosis present

## 2021-05-23 DIAGNOSIS — M899 Disorder of bone, unspecified: Secondary | ICD-10-CM | POA: Diagnosis present

## 2021-05-23 DIAGNOSIS — S72401D Unspecified fracture of lower end of right femur, subsequent encounter for closed fracture with routine healing: Secondary | ICD-10-CM | POA: Diagnosis not present

## 2021-05-23 DIAGNOSIS — S299XXA Unspecified injury of thorax, initial encounter: Secondary | ICD-10-CM | POA: Diagnosis not present

## 2021-05-23 DIAGNOSIS — I5032 Chronic diastolic (congestive) heart failure: Secondary | ICD-10-CM

## 2021-05-23 DIAGNOSIS — Z9842 Cataract extraction status, left eye: Secondary | ICD-10-CM

## 2021-05-23 DIAGNOSIS — S72444A Nondisplaced fracture of lower epiphysis (separation) of right femur, initial encounter for closed fracture: Secondary | ICD-10-CM | POA: Diagnosis not present

## 2021-05-23 DIAGNOSIS — D638 Anemia in other chronic diseases classified elsewhere: Secondary | ICD-10-CM | POA: Diagnosis present

## 2021-05-23 DIAGNOSIS — K922 Gastrointestinal hemorrhage, unspecified: Secondary | ICD-10-CM | POA: Diagnosis not present

## 2021-05-23 DIAGNOSIS — R69 Illness, unspecified: Secondary | ICD-10-CM | POA: Diagnosis not present

## 2021-05-23 DIAGNOSIS — M5020 Other cervical disc displacement, unspecified cervical region: Secondary | ICD-10-CM | POA: Diagnosis not present

## 2021-05-23 DIAGNOSIS — Z9841 Cataract extraction status, right eye: Secondary | ICD-10-CM

## 2021-05-23 DIAGNOSIS — I82512 Chronic embolism and thrombosis of left femoral vein: Secondary | ICD-10-CM | POA: Diagnosis not present

## 2021-05-23 DIAGNOSIS — Z7189 Other specified counseling: Secondary | ICD-10-CM | POA: Diagnosis not present

## 2021-05-23 DIAGNOSIS — I110 Hypertensive heart disease with heart failure: Secondary | ICD-10-CM | POA: Diagnosis not present

## 2021-05-23 DIAGNOSIS — N189 Chronic kidney disease, unspecified: Secondary | ICD-10-CM | POA: Diagnosis not present

## 2021-05-23 DIAGNOSIS — J9811 Atelectasis: Secondary | ICD-10-CM | POA: Diagnosis not present

## 2021-05-23 DIAGNOSIS — E1151 Type 2 diabetes mellitus with diabetic peripheral angiopathy without gangrene: Secondary | ICD-10-CM | POA: Diagnosis not present

## 2021-05-23 DIAGNOSIS — Z6833 Body mass index (BMI) 33.0-33.9, adult: Secondary | ICD-10-CM | POA: Diagnosis not present

## 2021-05-23 DIAGNOSIS — I6529 Occlusion and stenosis of unspecified carotid artery: Secondary | ICD-10-CM | POA: Diagnosis not present

## 2021-05-23 DIAGNOSIS — M6281 Muscle weakness (generalized): Secondary | ICD-10-CM | POA: Diagnosis not present

## 2021-05-23 DIAGNOSIS — T508X5A Adverse effect of diagnostic agents, initial encounter: Secondary | ICD-10-CM | POA: Diagnosis present

## 2021-05-23 DIAGNOSIS — B303 Acute epidemic hemorrhagic conjunctivitis (enteroviral): Secondary | ICD-10-CM | POA: Diagnosis present

## 2021-05-23 DIAGNOSIS — Z961 Presence of intraocular lens: Secondary | ICD-10-CM | POA: Diagnosis present

## 2021-05-23 DIAGNOSIS — I82431 Acute embolism and thrombosis of right popliteal vein: Secondary | ICD-10-CM | POA: Diagnosis not present

## 2021-05-23 LAB — BASIC METABOLIC PANEL
Anion gap: 14 (ref 5–15)
BUN: 29 mg/dL — ABNORMAL HIGH (ref 8–23)
CO2: 28 mmol/L (ref 22–32)
Calcium: 9 mg/dL (ref 8.9–10.3)
Chloride: 83 mmol/L — ABNORMAL LOW (ref 98–111)
Creatinine, Ser: 1.88 mg/dL — ABNORMAL HIGH (ref 0.44–1.00)
GFR, Estimated: 29 mL/min — ABNORMAL LOW (ref >60)
Glucose, Bld: 164 mg/dL — ABNORMAL HIGH (ref 70–99)
Potassium: 3 mmol/L — ABNORMAL LOW (ref 3.5–5.1)
Sodium: 125 mmol/L — ABNORMAL LOW (ref 135–145)

## 2021-05-23 LAB — RESP PANEL BY RT-PCR (FLU A&B, COVID) ARPGX2
Influenza A by PCR: NEGATIVE
Influenza B by PCR: NEGATIVE
SARS Coronavirus 2 by RT PCR: NEGATIVE

## 2021-05-23 LAB — CBC WITH DIFFERENTIAL/PLATELET
Abs Immature Granulocytes: 0.05 10*3/uL (ref 0.00–0.07)
Basophils Absolute: 0 10*3/uL (ref 0.0–0.1)
Basophils Relative: 0 %
Eosinophils Absolute: 0 10*3/uL (ref 0.0–0.5)
Eosinophils Relative: 1 %
HCT: 29.9 % — ABNORMAL LOW (ref 36.0–46.0)
Hemoglobin: 9.8 g/dL — ABNORMAL LOW (ref 12.0–15.0)
Immature Granulocytes: 1 %
Lymphocytes Relative: 9 %
Lymphs Abs: 0.7 10*3/uL (ref 0.7–4.0)
MCH: 27.3 pg (ref 26.0–34.0)
MCHC: 32.8 g/dL (ref 30.0–36.0)
MCV: 83.3 fL (ref 80.0–100.0)
Monocytes Absolute: 0.7 10*3/uL (ref 0.1–1.0)
Monocytes Relative: 10 %
Neutro Abs: 5.8 10*3/uL (ref 1.7–7.7)
Neutrophils Relative %: 79 %
Platelets: 142 10*3/uL — ABNORMAL LOW (ref 150–400)
RBC: 3.59 MIL/uL — ABNORMAL LOW (ref 3.87–5.11)
RDW: 18.6 % — ABNORMAL HIGH (ref 11.5–15.5)
WBC: 7.2 10*3/uL (ref 4.0–10.5)
nRBC: 0 % (ref 0.0–0.2)

## 2021-05-23 MED ORDER — HYDROCODONE-ACETAMINOPHEN 5-325 MG PO TABS
1.0000 | ORAL_TABLET | Freq: Four times a day (QID) | ORAL | Status: DC | PRN
  Administered 2021-05-24: 2 via ORAL
  Filled 2021-05-23: qty 2

## 2021-05-23 MED ORDER — MORPHINE SULFATE (PF) 2 MG/ML IV SOLN
0.5000 mg | INTRAVENOUS | Status: DC | PRN
  Administered 2021-05-24 (×3): 0.5 mg via INTRAVENOUS
  Filled 2021-05-23 (×3): qty 1

## 2021-05-23 MED ORDER — HYDROXYZINE HCL 25 MG PO TABS
25.0000 mg | ORAL_TABLET | Freq: Two times a day (BID) | ORAL | Status: DC | PRN
  Administered 2021-05-24: 25 mg via ORAL
  Filled 2021-05-23 (×3): qty 1

## 2021-05-23 MED ORDER — METOLAZONE 5 MG PO TABS
5.0000 mg | ORAL_TABLET | Freq: Every day | ORAL | Status: DC
  Administered 2021-05-24: 5 mg via ORAL
  Filled 2021-05-23 (×2): qty 1

## 2021-05-23 MED ORDER — INSULIN ASPART 100 UNIT/ML IJ SOLN
0.0000 [IU] | INTRAMUSCULAR | Status: DC
  Administered 2021-05-24 (×4): 3 [IU] via SUBCUTANEOUS
  Administered 2021-05-25 – 2021-05-26 (×7): 4 [IU] via SUBCUTANEOUS
  Administered 2021-05-26 (×3): 3 [IU] via SUBCUTANEOUS
  Administered 2021-05-27: 4 [IU] via SUBCUTANEOUS
  Administered 2021-05-27 (×2): 3 [IU] via SUBCUTANEOUS
  Administered 2021-05-27: 4 [IU] via SUBCUTANEOUS
  Administered 2021-05-28: 3 [IU] via SUBCUTANEOUS
  Administered 2021-05-28: 4 [IU] via SUBCUTANEOUS
  Administered 2021-05-28: 3 [IU] via SUBCUTANEOUS
  Administered 2021-05-28: 4 [IU] via SUBCUTANEOUS
  Filled 2021-05-23 (×22): qty 1

## 2021-05-23 MED ORDER — FUROSEMIDE 40 MG PO TABS
80.0000 mg | ORAL_TABLET | Freq: Two times a day (BID) | ORAL | Status: DC
  Administered 2021-05-24 – 2021-05-26 (×5): 80 mg via ORAL
  Filled 2021-05-23 (×5): qty 2

## 2021-05-23 MED ORDER — HYDROMORPHONE HCL 1 MG/ML IJ SOLN
1.0000 mg | Freq: Once | INTRAMUSCULAR | Status: AC
  Administered 2021-05-23: 1 mg via INTRAVENOUS
  Filled 2021-05-23: qty 1

## 2021-05-23 MED ORDER — BENZONATATE 100 MG PO CAPS: 100.0000 mg | ORAL_CAPSULE | Freq: Four times a day (QID) | ORAL | Status: DC | PRN

## 2021-05-23 MED ORDER — OXYCODONE-ACETAMINOPHEN 5-325 MG PO TABS
1.0000 | ORAL_TABLET | Freq: Once | ORAL | Status: AC
  Administered 2021-05-23: 1 via ORAL
  Filled 2021-05-23: qty 1

## 2021-05-23 MED ORDER — SPIRONOLACTONE 25 MG PO TABS
100.0000 mg | ORAL_TABLET | Freq: Every day | ORAL | Status: DC
  Administered 2021-05-24 – 2021-06-03 (×11): 100 mg via ORAL
  Filled 2021-05-23 (×5): qty 4
  Filled 2021-05-23: qty 1
  Filled 2021-05-23: qty 4
  Filled 2021-05-23: qty 1
  Filled 2021-05-23: qty 4
  Filled 2021-05-23: qty 1
  Filled 2021-05-23: qty 4
  Filled 2021-05-23 (×2): qty 1
  Filled 2021-05-23: qty 4
  Filled 2021-05-23: qty 1
  Filled 2021-05-23 (×2): qty 4

## 2021-05-23 MED ORDER — LACTULOSE 10 GM/15ML PO SOLN
20.0000 g | Freq: Two times a day (BID) | ORAL | Status: DC
  Administered 2021-05-24 – 2021-05-25 (×3): 20 g via ORAL
  Filled 2021-05-23 (×3): qty 30

## 2021-05-23 MED ORDER — LORAZEPAM 2 MG/ML IJ SOLN
0.5000 mg | Freq: Once | INTRAMUSCULAR | Status: AC
  Administered 2021-05-24: 0.5 mg via INTRAVENOUS
  Filled 2021-05-23: qty 1

## 2021-05-23 MED ORDER — PANTOPRAZOLE SODIUM 40 MG PO TBEC
40.0000 mg | DELAYED_RELEASE_TABLET | Freq: Two times a day (BID) | ORAL | Status: DC
  Administered 2021-05-24 – 2021-06-03 (×21): 40 mg via ORAL
  Filled 2021-05-23 (×21): qty 1

## 2021-05-23 NOTE — ED Notes (Signed)
Pt reports central chest pain r/t fall, stating that she landed on her breastbone and is sore. Currently reporting 10/10 leg pain. Pt has a power port R chest that has not yet been accessed. No additional injuries noted or reported.

## 2021-05-23 NOTE — H&P (Signed)
History and Physical    CARLO LORSON XNT:[id number] DOB: 1951-09-18 DOA: 05/23/2021  PCP: Ileene Hutchinson, PA   Patient coming from: home  I have personally briefly reviewed patient's old medical records in [place]  Chief Complaint: right knee pain, fall  HPI: Crystal [name] is a 70 y.o. 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  Patient usually ambulates with a walker but was using her cane and said she felt like she tripped on her shoe.  She had no loss of consciousness.  She was previously in her usual state of health and denied any preceding headache, visual disturbance or lightheadedness, chest pain or palpitations, one-sided weakness numbness or tingling.  She was hospitalized 3 weeks ago with a GI bleed but had been at her baseline.  Patient had a recent normal right heart cath on 7/8  ED course: On arrival, vitals within normal limits.  Blood work with hemoglobin 9.8 improved from baseline of 7.5-8.5, sodium 125 not far from baseline hyponatremia, potassium 3.0, creatinine 1.88 baseline for her CKD 4  Imaging: CT knee shows nondisplaced fracture distal right femur with suprapatellar hemarthrosis  ED provider spoke with orthopedics who recommended knee immobilizer and admission to hospitalist service with Ortho consulting.  Will evaluate in the a.m. to see if patient will need to go to the OR.   Review of Systems: As per HPI otherwise all other systems on review of systems negative.    Past Medical History:  Diagnosis Date   Acute GI bleeding 02/09/2018   Acute upper gastrointestinal bleeding 08/03/2018   Anemia    TAKES IRON TAB   Arthritis    SHOULDER   B12 deficiency 02/18/2018    Bleeding    GI  3/19   Bronchitis    HX OF   CHF (congestive heart failure) (HCC)    Cirrhosis (HCC)    Colon cancer screening    Diabetes mellitus without complication ([place])    TYPE 2   Fatty (change of) liver, not elsewhere classified    Full dentures    UPPER AND LOWER   Iron deficiency anemia due to chronic blood loss 02/18/2018   Lung nodule, multiple    Pernicious anemia 02/22/2018   PUD (peptic ulcer disease)    Raynaud's syndrome    Upper GI bleeding 08/03/2018    Past Surgical History:  Procedure Laterality Date   CATARACT EXTRACTION W/PHACO Right 02/06/2018   Procedure: CATARACT EXTRACTION PHACO AND INTRAOCULAR LENS PLACEMENT ([place]) RIGHT DIABETIC;  Surgeon: Leandrew Koyanagi, MD;  Location: [place];  Service: Ophthalmology;  Laterality: Right;  DIABETIC, ORAL MED   CATARACT EXTRACTION W/PHACO Left 04/30/2018   Procedure: CATARACT EXTRACTION PHACO AND INTRAOCULAR LENS PLACEMENT (IOC);  Surgeon: Leandrew Koyanagi, MD;  Location: [facility];  Service: Ophthalmology;  Laterality: Left;  [place] 01:04 AP% 21.3 CDE 13.66 Fluid pack lot # [id number]   COLONOSCOPY WITH PROPOFOL N/A 03/25/2018   Procedure: COLONOSCOPY WITH PROPOFOL;  Surgeon: Lin Landsman, MD;  Location: [facility] ENDOSCOPY;  Service: Gastroenterology;  Laterality: N/A;   DENTAL SURGERY     EXTRACTIONS   ELECTROMAGNETIC NAVIGATION BROCHOSCOPY Right 01/27/2019   Procedure: ELECTROMAGNETIC NAVIGATION BRONCHOSCOPY;  Surgeon: [name], MD;  Location: [facility];  Service: Cardiopulmonary;  Laterality: Right;   ENTEROSCOPY N/A 07/28/2020   Procedure: Push ENTEROSCOPY;  Surgeon: Lin Landsman, MD;  Location: [facility] ENDOSCOPY;  Service: Gastroenterology;  Laterality: N/A;   ESOPHAGOGASTRODUODENOSCOPY N/A 08/03/2018   Procedure: ESOPHAGOGASTRODUODENOSCOPY (EGD);  Surgeon: Lin Landsman, MD;  Location: [facility] ENDOSCOPY;  Service: Gastroenterology;  Laterality: N/A;   ESOPHAGOGASTRODUODENOSCOPY (EGD) WITH  PROPOFOL N/A 02/10/2018   Procedure: ESOPHAGOGASTRODUODENOSCOPY (EGD) WITH PROPOFOL;  Surgeon: Jonathon Bellows, MD;  Location: [facility] ENDOSCOPY;  Service: Gastroenterology;  Laterality: N/A;   ESOPHAGOGASTRODUODENOSCOPY (EGD) WITH PROPOFOL N/A 03/25/2018   Procedure: ESOPHAGOGASTRODUODENOSCOPY (EGD) WITH PROPOFOL;  Surgeon: Lin Landsman, MD;  Location: [facility] ENDOSCOPY;  Service: Gastroenterology;  Laterality: N/A;   ESOPHAGOGASTRODUODENOSCOPY (EGD) WITH PROPOFOL N/A 04/22/2018   Procedure: ESOPHAGOGASTRODUODENOSCOPY (EGD) WITH PROPOFOL with band ligation;  Surgeon: Lin Landsman, MD;  Location: [place];  Service: Gastroenterology;  Laterality: N/A;   ESOPHAGOGASTRODUODENOSCOPY (EGD) WITH PROPOFOL N/A 06/24/2018   Procedure: ESOPHAGOGASTRODUODENOSCOPY (EGD) WITH PROPOFOL;  Surgeon: Lin Landsman, MD;  Location: [facility] ENDOSCOPY;  Service: Gastroenterology;  Laterality: N/A;   ESOPHAGOGASTRODUODENOSCOPY (EGD) WITH PROPOFOL N/A 11/19/2018   Procedure: ESOPHAGOGASTRODUODENOSCOPY (EGD) WITH PROPOFOL;  Surgeon: Lin Landsman, MD;  Location: [place];  Service: Gastroenterology;  Laterality: N/A;   ESOPHAGOGASTRODUODENOSCOPY (EGD) WITH PROPOFOL N/A 03/08/2020   Procedure: ESOPHAGOGASTRODUODENOSCOPY (EGD) WITH PROPOFOL;  Surgeon: Lin Landsman, MD;  Location: [facility] ENDOSCOPY;  Service: Gastroenterology;  Laterality: N/A;   ESOPHAGOGASTRODUODENOSCOPY (EGD) WITH PROPOFOL N/A 04/28/2021   Procedure: ESOPHAGOGASTRODUODENOSCOPY (EGD) WITH PROPOFOL;  Surgeon: Lin Landsman, MD;  Location: [facility] ENDOSCOPY;  Service: Gastroenterology;  Laterality: N/A;   EYE SURGERY     GIVENS CAPSULE STUDY N/A 03/29/2020   Procedure: GIVENS CAPSULE STUDY;  Surgeon: Lin Landsman, MD;  Location: [facility] ENDOSCOPY;  Service: Gastroenterology;  Laterality: N/A;   HEMORRHOID BANDING  03/25/2018   Procedure: HEMORRHOID BANDING;  Surgeon: Lin Landsman, MD;  Location: [facility] ENDOSCOPY;  Service:  Gastroenterology;;   IR EMBO ART  VEN HEMORR LYMPH EXTRAV  INC GUIDE ROADMAPPING  08/07/2018   IR RADIOLOGIST EVAL & MGMT  09/03/2018   IR RADIOLOGIST EVAL & MGMT  12/17/2018   IR RADIOLOGIST EVAL & MGMT  06/24/2019   IR RADIOLOGIST EVAL & MGMT  01/22/2020   IR RADIOLOGIST EVAL & MGMT  02/09/2021   IR TIPS  08/07/2018   IR TRANSHEPATIC PORTOGRAM W HEMO  03/07/2021   IR US GUIDE VASC ACCESS RIGHT  03/07/2021   IR VENO/JUGULAR LEFT  03/07/2021   IVC FILTER INSERTION N/A 04/28/2021   Procedure: IVC FILTER INSERTION;  Surgeon: Algernon Huxley, MD;  Location: [place] CV LAB;  Service: Cardiovascular;  Laterality: N/A;   PORTA CATH INSERTION N/A 07/30/2020   Procedure: PORTA CATH INSERTION;  Surgeon: Katha Cabal, MD;  Location: [place] CV LAB;  Service: Cardiovascular;  Laterality: N/A;   RADIOLOGY WITH ANESTHESIA N/A 08/07/2018   Procedure: TIPS;  Surgeon: Corrie Mckusick, DO;  Location: [place];  Service: Anesthesiology;  Laterality: N/A;   RIGHT HEART CATH N/A 05/20/2021   Procedure: RIGHT HEART CATH;  Surgeon: Nelva Bush, MD;  Location: [place] CV LAB;  Service: Cardiovascular;  Laterality: N/A;   TONSILLECTOMY       reports that she has never smoked. She has never used smokeless tobacco. She reports previous alcohol use. She reports that she does not use drugs.  No Known Allergies  Family History  Problem Relation Age of Onset  CAD Father    Heart attack Father 48   Cancer Maternal Uncle    Seizures Mother       Prior to Admission medications   Medication Sig Start Date End Date Taking? Authorizing Provider  aspirin-sod bicarb-citric acid (ALKA-SELTZER) 325 MG TBEF tablet Take 325 mg by mouth every 6 (six) hours as needed (indigestion).    [provider]  benzonatate (TESSALON) 100 MG capsule Take 1 capsule (100 mg total) by mouth 4 (four) times daily as needed. Patient taking differently: Take 100 mg by mouth 4 (four) times daily as needed for cough. 05/01/21    Sreenath, Sudheer B, MD  cyanocobalamin (,VITAMIN B-12,) 1000 MCG/ML injection Inject 1,000 mcg into the muscle every 30 (thirty) days.     [provider]  furosemide (LASIX) 80 MG tablet Take 1 tablet (80 mg total) by mouth 2 (two) times daily. 05/17/21   Loel Dubonnet, NP  gabapentin (NEURONTIN) 100 MG capsule Take 2 capsules (200 mg total) by mouth 2 (two) times daily for 10 days. 05/01/21 05/20/21  Sidney Ace, MD  hydrOXYzine (ATARAX/VISTARIL) 25 MG tablet Take 1 tablet (25 mg total) by mouth 2 (two) times daily as needed. Patient taking differently: Take 25 mg by mouth 2 (two) times daily as needed for itching. 04/08/21   Kathie Dike, MD  lactulose (CHRONULAC) 10 GM/15ML solution Take 30 mLs (20 g total) by mouth 3 (three) times daily. Patient taking differently: Take 20 g by mouth 2 (two) times daily. 04/08/21   Kathie Dike, MD  metolazone (ZAROXOLYN) 5 MG tablet Take 1 tablet (5 mg total) by mouth every other day. 05/20/21 09/17/21  Nelva Bush, MD  Misc. Devices KIT Moderate compression hose 20-30 MM HG 10/14/19   Vanga, Tally Due, MD  ondansetron (ZOFRAN) 4 MG tablet Take 4 mg by mouth every 8 (eight) hours as needed for nausea or vomiting.    [provider]  OneTouch Delica Lancets 30Q MISC 2 (two) times daily. 12/01/19   [provider]  [facility] ULTRA test strip 2 (two) times daily. 11/23/19   [provider]  pantoprazole (PROTONIX) 40 MG tablet Take 1 tablet (40 mg total) by mouth 2 (two) times daily. 05/01/21 05/31/21  Sidney Ace, MD  potassium chloride (KLOR-CON) 20 MEQ tablet Take 1 tablet (20 mEq total) by mouth 2 (two) times daily. 05/11/21 09/08/21  End, Harrell Gave, MD  sodium bicarbonate 650 MG tablet Take 1,300 mg by mouth 2 (two) times daily. 02/23/21   [provider]  spironolactone (ALDACTONE) 100 MG tablet TAKE 1 TABLET BY MOUTH EVERY DAY Patient taking differently: Take 100 mg by mouth daily. 04/18/21    Lin Landsman, MD  Tetrahydrozoline HCl (VISINE OP) Place 1 drop into both eyes daily as needed (irritaiton).    [provider]  Vitamin D, Ergocalciferol, (DRISDOL) 1.25 MG (50000 UNIT) CAPS capsule Take 50,000 Units by mouth every Sunday. 01/14/21   [provider]  XIFAXAN 550 MG TABS tablet Take 550 mg by mouth 2 (two) times daily. 06/24/20   [provider]    Physical Exam: Vitals:   05/23/21 1631 05/23/21 1825 05/23/21 2230 05/23/21 2300  BP:  126/62 118/66 111/60  Pulse:  65 80 83  Resp:  _0 Temp:      TempSrc:      SpO2:  93% 100% 100%  Weight: 98.8 kg     Height: 5' 2" (1.575 m)  Vitals:   05/23/21 1631 05/23/21 1825 05/23/21 2230 05/23/21 2300  BP:  126/62 118/66 111/60  Pulse:  65 80 83  Resp:  _0 Temp:      TempSrc:      SpO2:  93% 100% 100%  Weight: 98.8 kg     Height: 5' 2" (1.575 m)         Constitutional: Alert and oriented x 3 . Not in any apparent distress HEENT:      Head: Normocephalic and atraumatic.         Eyes: PERLA, EOMI, Conjunctivae are normal. Sclera is jaundiced      Mouth/Throat: Mucous membranes are moist.       Neck: Supple with no signs of meningismus. Cardiovascular: Regular rate and rhythm. No murmurs, gallops, or rubs. 2+ symmetrical distal pulses are present . No JVD. No LE edema Respiratory: Respiratory effort normal .Lungs sounds clear bilaterally. No wheezes, crackles, or rhonchi.  Gastrointestinal: Soft, non tender, and non distended with positive bowel sounds.  Genitourinary: No CVA tenderness. Musculoskeletal: Right leg in immobilizer. no cyanosis, or erythema of extremities. Neurologic:  Face is symmetric. Moving all extremities. No gross focal neurologic deficits . Skin: Skin is warm, dry.  No rash or ulcers Psychiatric: Mood and affect are normal    Labs on Admission: I have personally reviewed following labs and imaging studies  CBC: Recent Labs  Lab 05/19/21 1432  05/23/21 2240  WBC 4.3 7.2  NEUTROABS 2.4 5.8  HGB 8.4* 9.8*  HCT 25.5* 29.9*  MCV 82.0 83.3  PLT 133* 542*   Basic Metabolic Panel: Recent Labs  Lab 05/19/21 1432 05/23/21 2240  NA 127* 125*  K 3.4* 3.0*  CL 87* 83*  CO2 30 28  GLUCOSE 109* 164*  BUN 28* 29*  CREATININE 1.87* 1.88*  CALCIUM 8.6* 9.0   GFR: Estimated Creatinine Clearance: 31 mL/min (A) (by C-G formula based on SCr of 1.88 mg/dL (H)). Liver Function Tests: Recent Labs  Lab 05/19/21 1432  AST 24  ALT 10  ALKPHOS 127*  BILITOT 1.5*  PROT 5.5*  ALBUMIN 2.0*   No results for input(s): LIPASE, AMYLASE in the last 168 hours. No results for input(s): AMMONIA in the last 168 hours. Coagulation Profile: No results for input(s): INR, PROTIME in the last 168 hours. Cardiac Enzymes: No results for input(s): CKTOTAL, CKMB, CKMBINDEX, TROPONINI in the last 168 hours. BNP (last 3 results) No results for input(s): PROBNP in the last 8760 hours. HbA1C: No results for input(s): HGBA1C in the last 72 hours. CBG: Recent Labs  Lab 05/20/21 1035 05/20/21 1241 05/20/21 1312  GLUCAP 88 59* 99   Lipid Profile: No results for input(s): CHOL, HDL, LDLCALC, TRIG, CHOLHDL, LDLDIRECT in the last 72 hours. Thyroid Function Tests: No results for input(s): TSH, T4TOTAL, FREET4, T3FREE, THYROIDAB in the last 72 hours. Anemia Panel: No results for input(s): VITAMINB12, FOLATE, FERRITIN, TIBC, IRON, RETICCTPCT in the last 72 hours. Urine analysis:    Component Value Date/Time   COLORURINE STRAW (A) 04/04/2021 0127   APPEARANCEUR CLEAR (A) 04/04/2021 0127   LABSPEC 1.006 04/04/2021 0127   PHURINE 9.0 (H) 04/04/2021 0127   GLUCOSEU NEGATIVE 04/04/2021 0127   HGBUR NEGATIVE 04/04/2021 0127   BILIRUBINUR NEGATIVE 04/04/2021 0127   KETONESUR NEGATIVE 04/04/2021 0127   PROTEINUR NEGATIVE 04/04/2021 0127   NITRITE NEGATIVE 04/04/2021 0127   LEUKOCYTESUR LARGE (A) 04/04/2021 0127    Radiological Exams on Admission: CT  Head Wo Contrast  Result Date: 05/23/2021 CLINICAL DATA:  Pain after fall EXAM: CT HEAD WITHOUT CONTRAST CT CERVICAL SPINE WITHOUT CONTRAST TECHNIQUE: Multidetector CT imaging of the head and cervical spine was performed following the standard protocol without intravenous contrast. Multiplanar CT image reconstructions of the cervical spine were also generated. COMPARISON:  Apr 04, 2021 FINDINGS: CT HEAD FINDINGS Brain: Similar mild age related global parenchymal volume loss. Stable mild burden of chronic ischemic white matter microvascular disease. No evidence of acute large vascular territory infarction, hemorrhage, hydrocephalus, extra-axial collection or mass lesion/mass effect. Vascular: No hyperdense vessel. Atherosclerotic calcifications of the internal carotid and vertebral arteries at the skull base. Skull: Normal. Negative for fracture or focal lesion. Sinuses/Orbits: Visualized portions of the paranasal sinuses and mastoid air cells are predominantly clear. Orbits are grossly unremarkable. Other: None CT CERVICAL SPINE FINDINGS Alignment: Mild loss of the normal cervical lordosis. No evidence of traumatic listhesis. 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 No acute fracture. Soft tissues and spinal canal: No prevertebral fluid or swelling. No visible canal hematoma. Disc levels: Moderate multilevel discogenic disease most notable in the lower cervical spine. Upper chest: No acute abnormality. Other: Calcifications in the bilateral parotid glands. Carotid artery calcifications. Partially visualized right chest wall Port-A-Cath. IMPRESSION: 1. No evidence of acute intracranial process. 2. No evidence of acute fracture or traumatic listhesis of the cervical spine. 3. 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 4. 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 Electronically Signed   By: Dahlia Bailiff MD   On: 05/23/2021 17:53   CT Cervical Spine Wo Contrast  Result Date: 05/23/2021 CLINICAL DATA:  Pain after fall EXAM: CT HEAD WITHOUT CONTRAST CT CERVICAL SPINE WITHOUT CONTRAST TECHNIQUE: Multidetector CT imaging of the head and cervical spine was performed following the standard protocol without intravenous contrast. Multiplanar CT image reconstructions of the cervical spine were also generated. COMPARISON:  Apr 04, 2021 FINDINGS: CT HEAD FINDINGS Brain: Similar mild age related global parenchymal volume loss. Stable mild burden of chronic ischemic white matter microvascular disease. No evidence of acute large vascular territory infarction, hemorrhage, hydrocephalus, extra-axial collection or mass lesion/mass effect. Vascular: No hyperdense vessel. Atherosclerotic calcifications of the internal carotid and vertebral arteries at the skull base. Skull: Normal. Negative for fracture or focal lesion. Sinuses/Orbits: Visualized portions of the paranasal sinuses and mastoid air cells are predominantly clear. Orbits are grossly unremarkable. Other: None CT CERVICAL SPINE FINDINGS Alignment: Mild loss of the normal cervical lordosis. No evidence of traumatic listhesis. 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 No acute fracture. Soft tissues and spinal canal: No prevertebral fluid or swelling. No visible canal hematoma. Disc levels: Moderate multilevel discogenic disease most notable in the lower cervical spine. Upper chest: No acute abnormality. Other: Calcifications in the bilateral parotid glands. Carotid artery calcifications. Partially visualized right chest wall Port-A-Cath. IMPRESSION: 1. No evidence of acute intracranial process. 2. No evidence of acute fracture or traumatic listhesis of the cervical  spine. 3. 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 4. 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 Electronically Signed   By: Dahlia Bailiff MD   On: 05/23/2021 17:53   CT Knee Right Wo  Contrast  Result Date: 05/23/2021 CLINICAL DATA:  Fall EXAM: CT OF THE RIGHT KNEE WITHOUT CONTRAST TECHNIQUE: Multidetector CT imaging of the right knee was performed according to the standard protocol. Multiplanar CT image reconstructions were also generated. COMPARISON:  None. FINDINGS: There is a nondisplaced fracture of the distal right femur with fracture lines extending to the supracondylar lateral femoral surface. Fracture line of the middle portion of the distal femoral shaft extends to intercondylar surface. Fracture lines best delineated on series 4 and series 101. Right suprapatellar hemarthrosis. IMPRESSION: 1. Nondisplaced fracture of the distal right femur with fracture lines extending to the supracondylar lateral femoral cortex and intercondylar surface. 2. Right suprapatellar hemarthrosis. Electronically Signed   By: Ulyses Jarred M.D.   On: 05/23/2021 21:23   DG Chest Portable 1 View  Result Date: 05/23/2021 CLINICAL DATA:  Fall with blunt chest trauma EXAM: PORTABLE CHEST 1 VIEW COMPARISON:  Radiograph 04/29/2021, CT 02/01/2021 FINDINGS: Chronically coarsened interstitial and bronchitic features. More bandlike areas of opacity in the lung bases likely corresponding to areas of scarring in regions of as well as more nodular opacity in the right mid to lower lung no new consolidative process. No pneumothorax or effusion. No acute traumatic findings of the chest wall. Port-A-Cath projects over the right chest with tip in the mid SVC. Stable cardiomediastinal contours. IMPRESSION: No acute cardiopulmonary or traumatic findings. Ill-defined nodular opacities in the right lung base may reflect regions of masslike  opacity seen on comparison CT and radiography. Difficult to fully assess interval change on portable radiographs. Electronically Signed   By: Lovena Le M.D.   On: 05/23/2021 19:16   DG Knee Complete 4 Views Right  Result Date: 05/23/2021 CLINICAL DATA:  Pain after fall EXAM: RIGHT KNEE - COMPLETE 4+ VIEW COMPARISON:  None. FINDINGS: No evidence of fracture or dislocation. Moderate suprapatellar joint effusion with overlying soft tissue swelling. Patellar enthesophyte. Mild tricompartment degenerative change. Vascular calcifications. IMPRESSION: 1. No acute osseous abnormality. 2. Moderate suprapatellar joint effusion with overlying soft tissue swelling 3. Mild tricompartment degenerative change. Electronically Signed   By: Dahlia Bailiff MD   On: 05/23/2021 17:56     Assessment/Plan 70 year old female with history of cirrhosis secondary to NASH s/p TIPS, DM, PUD, dCHF, CKD, CKD4, history of GI bleed secondary to angiodysplasia with recent EGD 6/16, presenting with distal femur fracture secondary to an accidental fall    Fracture of femur, distal, right, closed ([place])   Accidental fall -Continue knee immobilizer - Pain control - Orthopedic consult - We will keep n.p.o. overnight in case of procedure in the a.m.  Cirrhosis secondary to [facility] S/P TIPS (transjugular intrahepatic portosystemic shunt) - Stable.  No acute disease suspected - Continue Xifaxan, spironolactone, furosemide, lactulose     Diabetes mellitus, type 2 ([place]) - Not currently on hypoglycemic agents - Sliding scale insulin coverage    Chronic kidney disease, stage IV (severe) (HCC) - Creatinine at baseline at 1.88    DVT prophylaxis: Lovenox  Code Status: full code  Family Communication:  none  Disposition Plan: Back to previous home environment Consults called: Orthopedics Status:At the time of admission, it appears that the appropriate admission status for this patient is INPATIENT. This is judged to be reasonable  and necessary in order to provide the required intensity of service to ensure the patient's safety given the presenting symptoms, physical exam findings, and initial radiographic and laboratory data in the context of their  Comorbid conditions.   Patient requires inpatient status  due to high intensity of service, high risk for further deterioration and high frequency of surveillance required.   I certify that at the point of admission it is my clinical judgment that the patient will require inpatient hospital care spanning beyond [place] MD [facility]     05/23/2021, 11:30 PM

## 2021-05-23 NOTE — ED Triage Notes (Signed)
Fell in bathroom at cancer center.  C/O face and right knee pain.  No LOC

## 2021-05-23 NOTE — ED Triage Notes (Signed)
Pt here from [facility] with c/o fall. Pt states her shoe just wouldn't move. Pt states she fell forward. Pt denies any LOC or blood thinners. Pt also sates right knee pain.

## 2021-05-23 NOTE — ED Provider Notes (Signed)
[facility] Emergency Department Provider Note  ____________________________________________  Time seen: Approximately 10:35 PM  I have reviewed the triage vital signs and the nursing notes.   HISTORY  Chief Complaint Fall    HPI Crystal [name] is a 70 y.o. female with a past history of CHF cirrhosis status post TIPS procedure diabetes who comes to the ED today due to mechanical fall in the cancer center bathroom.  She normally ambulates with a walker and while going to the bathroom she was trying to use only a cane.  She felt like her foot tripped on the floor and fell forward onto her knee.  Also hit her nose.  Denies any headache or neck pain currently.  No loss of consciousness.  She has 10/10 pain in the right knee which is nonradiating, worse with movement, no alleviating factors.  Constant.    Past Medical History:  Diagnosis Date   Acute GI bleeding 02/09/2018   Acute upper gastrointestinal bleeding 08/03/2018   Anemia    TAKES IRON TAB   Arthritis    SHOULDER   B12 deficiency 02/18/2018   Bleeding    GI  3/19   Bronchitis    HX OF   CHF (congestive heart failure) (HCC)    Cirrhosis (HCC)    Colon cancer screening    Diabetes mellitus without complication ([place])    TYPE 2   Fatty (change of) liver, not elsewhere classified    Full dentures    UPPER AND LOWER   Iron deficiency anemia due to chronic blood loss 02/18/2018   Lung nodule, multiple    Pernicious anemia 02/22/2018   PUD (peptic ulcer disease)    Raynaud's syndrome    Upper GI bleeding 08/03/2018     Patient Active Problem List   Diagnosis Date Noted   Angiectasia 05/11/2021   GI bleeding 04/27/2021   Type II diabetes mellitus with renal manifestations ([place]) 04/27/2021   CAD (coronary artery disease) 04/27/2021   Chronic kidney disease, stage IV (severe) ([place]) 04/27/2021   DVT (deep venous thrombosis) ([place]) 04/27/2021   Acute blood loss anemia 04/27/2021   Acute deep vein  thrombosis (DVT) of distal vein of lower extremity ([place]) 04/08/2021   Liver cirrhosis secondary to NASH ([place]) 04/08/2021   Thrombocytopenia ([place]) 04/08/2021   Chronic diastolic CHF (congestive heart failure) ([place]) 04/08/2021   Fluid retention 02/21/2021   Edema 12/09/2020   Acute on chronic diastolic heart failure ([place]) 12/09/2020   Pulmonary nodules 05/02/2020   Hepatic encephalopathy ([place]) 04/23/2020   Left ovarian cyst 10/06/2019   Hypokalemia 08/21/2019   AMS (altered mental status) 08/20/2019   Acute osteomyelitis of phalanx of hand ([place]) 06/12/2019   Pain in finger of right hand 06/04/2019   Type 2 diabetes mellitus without complication, without long-term current use of insulin ([place]) 04/02/2019   Aortic atherosclerosis ([place]) 04/02/2019   Coronary artery disease involving native coronary artery of native heart without angina pectoris 04/02/2019   Acute on chronic heart failure with preserved ejection fraction (HFpEF) ([place]) 04/02/2019   Lung nodules    Volume overload 12/11/2018   GAVE (gastric antral vascular ectasia)    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   Type 2 diabetes mellitus with stage 3 chronic kidney disease ([place]) 08/03/2018   Hx of esophageal varices    Other cirrhosis of liver ([place])    Angiodysplasia of small intestine ([place])    Adnexal cyst 03/16/2018   Goals of care, counseling/discussion 03/16/2018  Right middle lobe pulmonary nodule 03/14/2018   Right lower lobe pulmonary nodule 02/18/2018   Iron deficiency anemia due to chronic blood loss 02/18/2018   B12 deficiency 02/18/2018   Anticentromere antibodies present 02/18/2018   Hepatitis C virus infection without hepatic coma 02/18/2018     Past Surgical History:  Procedure Laterality Date   CATARACT EXTRACTION W/PHACO Right 02/06/2018   Procedure: CATARACT EXTRACTION PHACO AND INTRAOCULAR LENS PLACEMENT ([place]) RIGHT DIABETIC;  Surgeon: Leandrew Koyanagi, MD;  Location:  [place];  Service: Ophthalmology;  Laterality: Right;  DIABETIC, ORAL MED   CATARACT EXTRACTION W/PHACO Left 04/30/2018   Procedure: CATARACT EXTRACTION PHACO AND INTRAOCULAR LENS PLACEMENT (IOC);  Surgeon: Leandrew Koyanagi, MD;  Location: [facility] ORS;  Service: Ophthalmology;  Laterality: Left;  [place] 01:04 AP% 21.3 CDE 13.66 Fluid pack lot # [id number]   COLONOSCOPY WITH PROPOFOL N/A 03/25/2018   Procedure: COLONOSCOPY WITH PROPOFOL;  Surgeon: Lin Landsman, MD;  Location: [facility] ENDOSCOPY;  Service: Gastroenterology;  Laterality: N/A;   DENTAL SURGERY     EXTRACTIONS   ELECTROMAGNETIC NAVIGATION BROCHOSCOPY Right 01/27/2019   Procedure: ELECTROMAGNETIC NAVIGATION BRONCHOSCOPY;  Surgeon: [name], MD;  Location: [facility] ORS;  Service: Cardiopulmonary;  Laterality: Right;   ENTEROSCOPY N/A 07/28/2020   Procedure: Push ENTEROSCOPY;  Surgeon: Lin Landsman, MD;  Location: [facility] ENDOSCOPY;  Service: Gastroenterology;  Laterality: N/A;   ESOPHAGOGASTRODUODENOSCOPY N/A 08/03/2018   Procedure: ESOPHAGOGASTRODUODENOSCOPY (EGD);  Surgeon: Lin Landsman, MD;  Location: [facility] ENDOSCOPY;  Service: Gastroenterology;  Laterality: N/A;   ESOPHAGOGASTRODUODENOSCOPY (EGD) WITH PROPOFOL N/A 02/10/2018   Procedure: ESOPHAGOGASTRODUODENOSCOPY (EGD) WITH PROPOFOL;  Surgeon: Jonathon Bellows, MD;  Location: [facility] ENDOSCOPY;  Service: Gastroenterology;  Laterality: N/A;   ESOPHAGOGASTRODUODENOSCOPY (EGD) WITH PROPOFOL N/A 03/25/2018   Procedure: ESOPHAGOGASTRODUODENOSCOPY (EGD) WITH PROPOFOL;  Surgeon: Lin Landsman, MD;  Location: [facility] ENDOSCOPY;  Service: Gastroenterology;  Laterality: N/A;   ESOPHAGOGASTRODUODENOSCOPY (EGD) WITH PROPOFOL N/A 04/22/2018   Procedure: ESOPHAGOGASTRODUODENOSCOPY (EGD) WITH PROPOFOL with band ligation;  Surgeon: Lin Landsman, MD;  Location: [place];  Service: Gastroenterology;  Laterality: N/A;   ESOPHAGOGASTRODUODENOSCOPY (EGD) WITH PROPOFOL N/A  06/24/2018   Procedure: ESOPHAGOGASTRODUODENOSCOPY (EGD) WITH PROPOFOL;  Surgeon: Lin Landsman, MD;  Location: [facility] ENDOSCOPY;  Service: Gastroenterology;  Laterality: N/A;   ESOPHAGOGASTRODUODENOSCOPY (EGD) WITH PROPOFOL N/A 11/19/2018   Procedure: ESOPHAGOGASTRODUODENOSCOPY (EGD) WITH PROPOFOL;  Surgeon: Lin Landsman, MD;  Location: [place];  Service: Gastroenterology;  Laterality: N/A;   ESOPHAGOGASTRODUODENOSCOPY (EGD) WITH PROPOFOL N/A 03/08/2020   Procedure: ESOPHAGOGASTRODUODENOSCOPY (EGD) WITH PROPOFOL;  Surgeon: Lin Landsman, MD;  Location: [facility] ENDOSCOPY;  Service: Gastroenterology;  Laterality: N/A;   ESOPHAGOGASTRODUODENOSCOPY (EGD) WITH PROPOFOL N/A 04/28/2021   Procedure: ESOPHAGOGASTRODUODENOSCOPY (EGD) WITH PROPOFOL;  Surgeon: Lin Landsman, MD;  Location: [facility] ENDOSCOPY;  Service: Gastroenterology;  Laterality: N/A;   EYE SURGERY     GIVENS CAPSULE STUDY N/A 03/29/2020   Procedure: GIVENS CAPSULE STUDY;  Surgeon: Lin Landsman, MD;  Location: [facility] ENDOSCOPY;  Service: Gastroenterology;  Laterality: N/A;   HEMORRHOID BANDING  03/25/2018   Procedure: HEMORRHOID BANDING;  Surgeon: Lin Landsman, MD;  Location: [facility] ENDOSCOPY;  Service: Gastroenterology;;   IR EMBO ART  VEN HEMORR LYMPH EXTRAV  INC GUIDE ROADMAPPING  08/07/2018   IR RADIOLOGIST EVAL & MGMT  09/03/2018   IR RADIOLOGIST EVAL & MGMT  12/17/2018   IR RADIOLOGIST EVAL & MGMT  06/24/2019   IR RADIOLOGIST EVAL & MGMT  01/22/2020   IR RADIOLOGIST EVAL & MGMT  02/09/2021   IR TIPS  08/07/2018   IR TRANSHEPATIC PORTOGRAM W HEMO  03/07/2021   IR US GUIDE VASC ACCESS RIGHT  03/07/2021   IR VENO/JUGULAR LEFT  03/07/2021   IVC FILTER INSERTION N/A 04/28/2021   Procedure: IVC FILTER INSERTION;  Surgeon: Algernon Huxley, MD;  Location: [place] CV LAB;  Service: Cardiovascular;  Laterality: N/A;   PORTA CATH INSERTION N/A 07/30/2020   Procedure: PORTA CATH INSERTION;  Surgeon: Katha Cabal,  MD;  Location: [place] CV LAB;  Service: Cardiovascular;  Laterality: N/A;   RADIOLOGY WITH ANESTHESIA N/A 08/07/2018   Procedure: TIPS;  Surgeon: Corrie Mckusick, DO;  Location: [place];  Service: Anesthesiology;  Laterality: N/A;   RIGHT HEART CATH N/A 05/20/2021   Procedure: RIGHT HEART CATH;  Surgeon: Nelva Bush, MD;  Location: [place] CV LAB;  Service: Cardiovascular;  Laterality: N/A;   TONSILLECTOMY       Prior to Admission medications   Medication Sig Start Date End Date Taking? Authorizing Provider  aspirin-sod bicarb-citric acid (ALKA-SELTZER) 325 MG TBEF tablet Take 325 mg by mouth every 6 (six) hours as needed (indigestion).    [provider]  benzonatate (TESSALON) 100 MG capsule Take 1 capsule (100 mg total) by mouth 4 (four) times daily as needed. Patient taking differently: Take 100 mg by mouth 4 (four) times daily as needed for cough. 05/01/21   Sreenath, Sudheer B, MD  cyanocobalamin (,VITAMIN B-12,) 1000 MCG/ML injection Inject 1,000 mcg into the muscle every 30 (thirty) days.     [provider]  furosemide (LASIX) 80 MG tablet Take 1 tablet (80 mg total) by mouth 2 (two) times daily. 05/17/21   Loel Dubonnet, NP  gabapentin (NEURONTIN) 100 MG capsule Take 2 capsules (200 mg total) by mouth 2 (two) times daily for 10 days. 05/01/21 05/20/21  Sidney Ace, MD  hydrOXYzine (ATARAX/VISTARIL) 25 MG tablet Take 1 tablet (25 mg total) by mouth 2 (two) times daily as needed. Patient taking differently: Take 25 mg by mouth 2 (two) times daily as needed for itching. 04/08/21   Kathie Dike, MD  lactulose (CHRONULAC) 10 GM/15ML solution Take 30 mLs (20 g total) by mouth 3 (three) times daily. Patient taking differently: Take 20 g by mouth 2 (two) times daily. 04/08/21   Kathie Dike, MD  metolazone (ZAROXOLYN) 5 MG tablet Take 1 tablet (5 mg total) by mouth every other day. 05/20/21 09/17/21  Nelva Bush, MD  Misc. Devices KIT Moderate  compression hose 20-30 MM HG 10/14/19   Vanga, Tally Due, MD  ondansetron (ZOFRAN) 4 MG tablet Take 4 mg by mouth every 8 (eight) hours as needed for nausea or vomiting.    [provider]  OneTouch Delica Lancets 46N MISC 2 (two) times daily. 12/01/19   [provider]  [facility] ULTRA test strip 2 (two) times daily. 11/23/19   [provider]  pantoprazole (PROTONIX) 40 MG tablet Take 1 tablet (40 mg total) by mouth 2 (two) times daily. 05/01/21 05/31/21  Sidney Ace, MD  potassium chloride (KLOR-CON) 20 MEQ tablet Take 1 tablet (20 mEq total) by mouth 2 (two) times daily. 05/11/21 09/08/21  End, Harrell Gave, MD  sodium bicarbonate 650 MG tablet Take 1,300 mg by mouth 2 (two) times daily. 02/23/21   [provider]  spironolactone (ALDACTONE) 100 MG tablet TAKE 1 TABLET BY MOUTH EVERY DAY Patient taking differently: Take 100 mg by mouth daily. 04/18/21   Lin Landsman, MD  Tetrahydrozoline HCl (VISINE OP) Place 1 drop into both eyes daily as needed (irritaiton).    [provider]  Vitamin D, Ergocalciferol, (DRISDOL) 1.25 MG (50000 UNIT) CAPS capsule Take 50,000 Units by mouth every Sunday. 01/14/21   [provider]  XIFAXAN 550 MG TABS tablet Take 550 mg by mouth 2 (two) times daily. 06/24/20   [provider]     Allergies Patient has no known allergies.   Family History  Problem Relation Age of Onset   CAD Father    Heart attack Father 66   Cancer Maternal Uncle    Seizures Mother     Social History Social History   Tobacco Use   Smoking status: Never   Smokeless tobacco: Never  Vaping Use   Vaping Use: Never used  Substance Use Topics   Alcohol use: Not Currently    Comment: No EtOH for 30 years.  Never a heavy drinker.   Drug use: Never    Review of Systems  Constitutional:   No fever or chills.  ENT:   No sore throat. No rhinorrhea. Cardiovascular:   No chest pain or syncope. Respiratory:   No  dyspnea or cough. Gastrointestinal:   Negative for abdominal pain, vomiting and diarrhea.  Musculoskeletal:   Right knee pain as above All other systems reviewed and are negative except as documented above in ROS and HPI.  ____________________________________________   PHYSICAL EXAM:  VITAL SIGNS: ED Triage Vitals  Enc Vitals Group     BP 05/23/21 1613 136/62     Pulse Rate 05/23/21 1613 97     Resp 05/23/21 1613 16     Temp 05/23/21 1613 98.2 F (36.8 C)     Temp Source 05/23/21 1613 Oral     SpO2 05/23/21 1613 100 %     Weight 05/23/21 1631 217 lb 13 oz (98.8 kg)     Height 05/23/21 1631 5' 2" (1.575 m)     Head Circumference --      Peak Flow --      Pain Score 05/23/21 1631 10     Pain Loc --      Pain Edu? --      Excl. in [place]? --     Vital signs reviewed, nursing assessments reviewed.   Constitutional:   Alert and oriented. Non-toxic appearance. Eyes:   Conjunctivae are normal. EOMI. PERRL. ENT      Head:   Normocephalic and atraumatic.  No battle sign or raccoon eyes,       Nose:        no epistaxis or septal hematoma Mouth/Throat:   No oral trauma      Neck:   No meningismus. Full ROM.  No midline tenderness Hematological/Lymphatic/Immunilogical:   No cervical lymphadenopathy. Cardiovascular:   RRR. Symmetric bilateral radial and DP pulses.  No murmurs. Cap refill less than 2 seconds. Respiratory:   Normal respiratory effort without tachypnea/retractions. Breath sounds are clear and equal bilaterally. No wheezes/rales/rhonchi. Gastrointestinal:   Soft and nontender. Non distended. There is no CVA tenderness.  No rebound, rigidity, or guarding. Genitourinary:   deferred Musculoskeletal:   Diffuse tenderness at the distal femur.  No deformity or crepitus.  No laceration.  Joint is stable.  Right upper anterior chest wall tender to the touch suggestive of rib fracture Neurologic:   Normal speech and language.  Motor grossly intact. No acute focal neurologic  deficits are appreciated.  Skin:    Skin is warm, dry and intact.  No rash noted.  No petechiae, purpura, or bullae.  ____________________________________________    LABS (pertinent positives/negatives) (all labs ordered are listed, but only abnormal results are displayed) Labs Reviewed - No data to display ____________________________________________   EKG    ____________________________________________    RADIOLOGY  CT Head Wo Contrast  Result Date: 05/23/2021 CLINICAL DATA:  Pain after fall EXAM: CT HEAD WITHOUT CONTRAST CT CERVICAL SPINE WITHOUT CONTRAST TECHNIQUE: Multidetector CT imaging of the head and cervical spine was performed following the standard protocol without intravenous contrast. Multiplanar CT image reconstructions of the cervical spine were also generated. COMPARISON:  Apr 04, 2021 FINDINGS: CT HEAD FINDINGS Brain: Similar mild age related global parenchymal volume loss. Stable mild burden of chronic ischemic white matter microvascular disease. No evidence of acute large vascular territory infarction, hemorrhage, hydrocephalus, extra-axial collection or mass lesion/mass effect. Vascular: No hyperdense vessel. Atherosclerotic calcifications of the internal carotid and vertebral arteries at the skull base. Skull: Normal. Negative for fracture or focal lesion. Sinuses/Orbits: Visualized portions of the paranasal sinuses and mastoid air cells are predominantly clear. Orbits are grossly unremarkable. Other: None CT CERVICAL SPINE FINDINGS Alignment: Mild loss of the normal cervical lordosis. No evidence of traumatic listhesis. 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 No acute fracture. Soft tissues and spinal canal: No prevertebral fluid or swelling. No visible canal hematoma. Disc levels: Moderate multilevel discogenic disease most notable in the lower cervical spine.  Upper chest: No acute abnormality. Other: Calcifications in the bilateral parotid glands. Carotid artery calcifications. Partially visualized right chest wall Port-A-Cath. IMPRESSION: 1. No evidence of acute intracranial process. 2. No evidence of acute fracture or traumatic listhesis of the cervical spine. 3. 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 4. 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 Electronically Signed   By: Dahlia Bailiff MD   On: 05/23/2021 17:53   CT Cervical Spine Wo Contrast  Result Date: 05/23/2021 CLINICAL DATA:  Pain after fall EXAM: CT HEAD WITHOUT CONTRAST CT CERVICAL SPINE WITHOUT CONTRAST TECHNIQUE: Multidetector CT imaging of the head and cervical spine was performed following the standard protocol without intravenous contrast. Multiplanar CT image reconstructions of the cervical spine were also generated. COMPARISON:  Apr 04, 2021 FINDINGS: CT HEAD FINDINGS Brain: Similar mild age related global parenchymal volume loss. Stable mild burden of chronic ischemic white matter microvascular disease. No evidence of acute large vascular territory infarction, hemorrhage, hydrocephalus, extra-axial collection or mass lesion/mass effect. Vascular: No hyperdense vessel. Atherosclerotic calcifications of the internal carotid and vertebral arteries at the skull base. Skull: Normal. Negative for fracture or focal lesion. Sinuses/Orbits: Visualized portions of the paranasal sinuses and mastoid air cells are predominantly clear. Orbits are grossly unremarkable. Other: None CT CERVICAL SPINE FINDINGS Alignment: Mild loss of the normal cervical lordosis. No evidence of traumatic listhesis. 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 No acute fracture. Soft tissues and spinal canal: No prevertebral  fluid or swelling. No visible canal hematoma. Disc levels: Moderate multilevel discogenic disease most notable in the lower cervical spine. Upper chest: No acute abnormality. Other: Calcifications in the bilateral parotid glands. Carotid artery calcifications. Partially visualized right chest wall Port-A-Cath. IMPRESSION: 1. No evidence of acute intracranial process. 2. No evidence of acute fracture or traumatic  listhesis of the cervical spine. 3. 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 4. 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 Electronically Signed   By: Dahlia Bailiff MD   On: 05/23/2021 17:53   CT Knee Right Wo Contrast  Result Date: 05/23/2021 CLINICAL DATA:  Fall EXAM: CT OF THE RIGHT KNEE WITHOUT CONTRAST TECHNIQUE: Multidetector CT imaging of the right knee was performed according to the standard protocol. Multiplanar CT image reconstructions were also generated. COMPARISON:  None. FINDINGS: There is a nondisplaced fracture of the distal right femur with fracture lines extending to the supracondylar lateral femoral surface. Fracture line of the middle portion of the distal femoral shaft extends to intercondylar surface. Fracture lines best delineated on series 4 and series 101. Right suprapatellar hemarthrosis. IMPRESSION: 1. Nondisplaced fracture of the distal right femur with fracture lines extending to the supracondylar lateral femoral cortex and intercondylar surface. 2. Right suprapatellar hemarthrosis. Electronically Signed   By: Ulyses Jarred M.D.   On: 05/23/2021 21:23   DG Chest Portable 1 View  Result Date: 05/23/2021 CLINICAL DATA:  Fall with blunt chest trauma EXAM: PORTABLE CHEST 1 VIEW COMPARISON:  Radiograph 04/29/2021, CT 02/01/2021 FINDINGS: Chronically coarsened interstitial and bronchitic features. More bandlike areas of opacity in the lung bases likely corresponding to areas  of scarring in regions of as well as more nodular opacity in the right mid to lower lung no new consolidative process. No pneumothorax or effusion. No acute traumatic findings of the chest wall. Port-A-Cath projects over the right chest with tip in the mid SVC. Stable cardiomediastinal contours. IMPRESSION: No acute cardiopulmonary or traumatic findings. Ill-defined nodular opacities in the right lung base may reflect regions of masslike opacity seen on comparison CT and radiography. Difficult to fully assess interval change on portable radiographs. Electronically Signed   By: Lovena Le M.D.   On: 05/23/2021 19:16   DG Knee Complete 4 Views Right  Result Date: 05/23/2021 CLINICAL DATA:  Pain after fall EXAM: RIGHT KNEE - COMPLETE 4+ VIEW COMPARISON:  None. FINDINGS: No evidence of fracture or dislocation. Moderate suprapatellar joint effusion with overlying soft tissue swelling. Patellar enthesophyte. Mild tricompartment degenerative change. Vascular calcifications. IMPRESSION: 1. No acute osseous abnormality. 2. Moderate suprapatellar joint effusion with overlying soft tissue swelling 3. Mild tricompartment degenerative change. Electronically Signed   By: Dahlia Bailiff MD   On: 05/23/2021 17:56    ____________________________________________   PROCEDURES Procedures  ____________________________________________  DIFFERENTIAL DIAGNOSIS   Intracranial hemorrhage, C-spine fracture, knee fracture, rib fracture  CLINICAL IMPRESSION / ASSESSMENT AND PLAN / ED COURSE  Medications ordered in the ED: Medications  oxyCODONE-acetaminophen (PERCOCET/ROXICET) 5-325 MG per tablet 1 tablet (1 tablet Oral Given 05/23/21 1747)  HYDROmorphone (DILAUDID) injection 1 mg (1 mg Intravenous Given 05/23/21 1959)    Pertinent labs & imaging results that were available during my care of the patient were reviewed by me and considered in my medical decision making (see chart for details).  Crystal [name] was  evaluated in Emergency Department on 05/23/2021 for the symptoms described in the history of present illness. She was evaluated in the context of the global COVID-19 pandemic, which necessitated consideration that the patient might be at risk for infection with the SARS-CoV-2 virus that causes COVID-19. Institutional protocols and algorithms that pertain to the evaluation of patients at risk for COVID-19 are in a state of rapid change based on information released by regulatory  bodies including the CDC and federal and state organizations. These policies and algorithms were followed during the patient's care in the ED.   Patient presents with mechanical fall resulting in head trauma and right knee trauma.  obtained x-rays of the knee and chest to look for fractures, CT head and neck.  CT head and neck unremarkable.  X-rays of the chest and right knee also unremarkable.    Clinical Course as of 05/23/21 2235  Mon May 23, 2021  1952 Patient still having severe pain in the right knee, unable to bear weight.  Will obtain CT of the knee, give Dilaudid 1 mg IV. [PS]    Clinical Course User Index [PS] Carrie Mew, MD     ----------------------------------------- 10:40 PM on 05/23/2021 ----------------------------------------- CT shows nondisplaced fracture of the right distal femur with intracondylar extension.  Discussed with orthopedics Dr. Sharlet Salina who recommends knee immobilizer.  We will need to admit for pain control.  ____________________________________________   FINAL CLINICAL IMPRESSION(S) / ED DIAGNOSES    Final diagnoses:  Closed nondisplaced supracondylar fracture of distal end of right femur with intracondylar extension, initial encounter [facility])  Morbid obesity [facility])     ED Discharge Orders     None       Portions of this note were generated with dragon dictation software. Dictation errors may occur despite best attempts at proofreading.    Carrie Mew,  MD 05/23/21 2241

## 2021-05-23 NOTE — ED Notes (Signed)
Patient says she tripped on her shoe and fell on her knee,chest, and face.

## 2021-05-23 NOTE — ED Notes (Signed)
Patient transported to CT

## 2021-05-24 LAB — GLUCOSE, CAPILLARY
Glucose-Capillary: 82 mg/dL (ref 70–99)
Glucose-Capillary: 117 mg/dL — ABNORMAL HIGH (ref 70–99)
Glucose-Capillary: 133 mg/dL — ABNORMAL HIGH (ref 70–99)
Glucose-Capillary: 130 mg/dL — ABNORMAL HIGH (ref 70–99)
Glucose-Capillary: 64 mg/dL — ABNORMAL LOW (ref 70–99)
Glucose-Capillary: 148 mg/dL — ABNORMAL HIGH (ref 70–99)
Glucose-Capillary: 81 mg/dL (ref 70–99)
Glucose-Capillary: 136 mg/dL — ABNORMAL HIGH (ref 70–99)
Glucose-Capillary: 165 mg/dL — ABNORMAL HIGH (ref 70–99)

## 2021-05-24 MED ORDER — GUAIFENESIN-DM 100-10 MG/5ML PO SYRP
5.0000 mL | ORAL_SOLUTION | ORAL | Status: DC | PRN
  Filled 2021-05-24: qty 5

## 2021-05-24 MED ORDER — ADULT MULTIVITAMIN W/MINERALS CH
1.0000 | ORAL_TABLET | Freq: Every day | ORAL | Status: DC
  Administered 2021-05-24 – 2021-06-03 (×11): 1 via ORAL
  Filled 2021-05-24 (×11): qty 1

## 2021-05-24 MED ORDER — PROSOURCE PLUS PO LIQD
30.0000 mL | Freq: Three times a day (TID) | ORAL | Status: DC
  Administered 2021-05-24 – 2021-06-03 (×24): 30 mL via ORAL
  Filled 2021-05-24 (×17): qty 30

## 2021-05-24 MED ORDER — OXYCODONE HCL 5 MG PO TABS
5.0000 mg | ORAL_TABLET | ORAL | Status: DC | PRN
  Administered 2021-05-25: 5 mg via ORAL
  Filled 2021-05-24 (×2): qty 1

## 2021-05-24 MED ORDER — POTASSIUM CHLORIDE CRYS ER 20 MEQ PO TBCR
40.0000 meq | EXTENDED_RELEASE_TABLET | Freq: Two times a day (BID) | ORAL | Status: DC
  Administered 2021-05-24 – 2021-05-27 (×6): 40 meq via ORAL
  Filled 2021-05-24 (×7): qty 2

## 2021-05-24 MED ORDER — CHLORHEXIDINE GLUCONATE CLOTH 2 % EX PADS
6.0000 | MEDICATED_PAD | Freq: Every day | CUTANEOUS | Status: DC
  Administered 2021-05-24 – 2021-05-29 (×5): 6 via TOPICAL

## 2021-05-24 MED ORDER — ONDANSETRON HCL 4 MG/2ML IJ SOLN
4.0000 mg | Freq: Four times a day (QID) | INTRAMUSCULAR | Status: DC | PRN
  Administered 2021-05-27 – 2021-06-01 (×2): 4 mg via INTRAVENOUS
  Filled 2021-05-24 (×2): qty 2

## 2021-05-24 MED ORDER — ACETAMINOPHEN 500 MG PO TABS
1000.0000 mg | ORAL_TABLET | Freq: Three times a day (TID) | ORAL | Status: DC
  Administered 2021-05-24 – 2021-05-28 (×12): 1000 mg via ORAL
  Filled 2021-05-24 (×12): qty 2

## 2021-05-24 MED ORDER — ONDANSETRON HCL 4 MG PO TABS: 4.0000 mg | ORAL_TABLET | Freq: Four times a day (QID) | ORAL | Status: DC | PRN

## 2021-05-24 MED ORDER — ALBUTEROL SULFATE (2.5 MG/3ML) 0.083% IN NEBU: 2.5000 mg | INHALATION_SOLUTION | RESPIRATORY_TRACT | Status: DC | PRN

## 2021-05-24 NOTE — NC FL2 (Signed)
[place] LEVEL OF CARE SCREENING TOOL     IDENTIFICATION  Patient Name: Crystal [name] Birthdate: Jul 27, 1951 Sex: female Admission Date (Current Location): 05/23/2021  [facility] and [place] Number:  Engineering geologist and Address:  [facility], [address], [place], [place] [address]      Provider Number: [id number]  Attending Physician Name and Address:  Edwin Dada, *  Relative Name and Phone Number:  Crystal [name], friend [phone]    Current Level of Care: Hospital Recommended Level of Care: [place] Prior Approval Number:    Date Approved/Denied:   PASRR Number: [id number]  Discharge Plan: SNF    Current Diagnoses: Patient Active Problem List   Diagnosis Date Noted   Closed fracture of distal end of right femur ([place]) 05/23/2021   Fracture of femur, distal, right, closed ([place]) 05/23/2021   Accidental fall 05/23/2021   Angiectasia 05/11/2021   GI bleeding 04/27/2021   Diabetes mellitus, type 2 ([place]) 04/27/2021   CAD (coronary artery disease) 04/27/2021   Chronic kidney disease, stage IV (severe) ([place]) 04/27/2021   DVT (deep venous thrombosis) ([place]) 04/27/2021   Acute blood loss anemia 04/27/2021   Acute deep vein thrombosis (DVT) of distal vein of lower extremity ([place]) 04/08/2021   Liver cirrhosis secondary to NASH ([place]) 04/08/2021   Thrombocytopenia ([place]) 04/08/2021   Chronic diastolic CHF (congestive heart failure) ([place]) 04/08/2021   Fluid retention 02/21/2021   Edema 12/09/2020   Acute on chronic diastolic heart failure ([place]) 12/09/2020   Pulmonary nodules 05/02/2020   Hepatic encephalopathy ([place]) 04/23/2020   Left ovarian cyst 10/06/2019   Hypokalemia 08/21/2019   AMS (altered mental status) 08/20/2019   Acute osteomyelitis of phalanx of hand ([place]) 06/12/2019   Pain in finger of right hand 06/04/2019   Type 2 diabetes mellitus without complication, without long-term current use of  insulin ([place]) 04/02/2019   Aortic atherosclerosis ([place]) 04/02/2019   Coronary artery disease involving native coronary artery of native heart without angina pectoris 04/02/2019   Acute on chronic heart failure with preserved ejection fraction (HFpEF) ([place]) 04/02/2019   Lung nodules    Volume overload 12/11/2018   GAVE (gastric antral vascular ectasia)    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   Type 2 diabetes mellitus with stage 3 chronic kidney disease ([place]) 08/03/2018   Hx of esophageal varices    Other cirrhosis of liver (HCC)    Angiodysplasia of small intestine ([place])    Adnexal cyst 03/16/2018   Goals of care, counseling/discussion 03/16/2018   Right middle lobe pulmonary nodule 03/14/2018   Right lower lobe pulmonary nodule 02/18/2018   Iron deficiency anemia due to chronic blood loss 02/18/2018   B12 deficiency 02/18/2018   Anticentromere antibodies present 02/18/2018   Hepatitis C virus infection without hepatic coma 02/18/2018    Orientation RESPIRATION BLADDER Height & Weight     Self, Time, Situation, Place  Normal External catheter Weight: 98.8 kg Height:  5' 2" (157.5 cm)  BEHAVIORAL SYMPTOMS/MOOD NEUROLOGICAL BOWEL NUTRITION STATUS      Continent Diet (regular)  AMBULATORY STATUS COMMUNICATION OF NEEDS Skin   Total Care (non wiehgt bearing) Verbally Normal                       Personal Care Assistance Level of Assistance  Bathing, Dressing Bathing Assistance: Limited assistance   Dressing Assistance: Limited assistance     Functional Limitations Info  SPECIAL CARE FACTORS FREQUENCY  PT (By licensed PT), OT (By licensed OT)     PT Frequency: 5 days a week OT Frequency: 3 days a week            Contractures Contractures Info: Not present    Additional Factors Info  Code Status, Allergies Code Status Info: full code Allergies Info: NKDA           Current Medications (05/24/2021):  This is the  current hospital active medication list Current Facility-Administered Medications  Medication Dose Route Frequency Provider Last Rate Last Admin   (feeding supplement) PROSource Plus liquid 30 mL  30 mL Oral TID BM Danford, Suann Larry, MD   30 mL at 05/24/21 1220   albuterol (PROVENTIL) (2.5 MG/3ML) 0.083% nebulizer solution 2.5 mg  2.5 mg Nebulization Q2H PRN Danford, Suann Larry, MD       benzonatate (TESSALON) capsule 100 mg  100 mg Oral QID PRN Athena Masse, MD       Chlorhexidine Gluconate Cloth 2 % PADS 6 each  6 each Topical Daily Danford, Suann Larry, MD   6 each at 05/24/21 0843   furosemide (LASIX) tablet 80 mg  80 mg Oral BID Athena Masse, MD   80 mg at 05/24/21 0838   guaiFENesin-dextromethorphan (ROBITUSSIN DM) 100-10 MG/5ML syrup 5 mL  5 mL Oral Q4H PRN Danford, Suann Larry, MD       HYDROcodone-acetaminophen (NORCO/VICODIN) 5-325 MG per tablet 1-2 tablet  1-2 tablet Oral Q6H PRN Athena Masse, MD   2 tablet at 05/24/21 1202   hydrOXYzine (ATARAX/VISTARIL) tablet 25 mg  25 mg Oral BID PRN Athena Masse, MD       insulin aspart (novoLOG) injection 0-20 Units  0-20 Units Subcutaneous Q4H Athena Masse, MD   3 Units at 05/24/21 1228   lactulose ([place]) 10 GM/15ML solution 20 g  20 g Oral BID Athena Masse, MD   20 g at 05/24/21 4098   metolazone (ZAROXOLYN) tablet 5 mg  5 mg Oral Daily Judd Gaudier V, MD   5 mg at 05/24/21 1191   morphine 2 MG/ML injection 0.5 mg  0.5 mg Intravenous Q2H PRN Athena Masse, MD   0.5 mg at 05/24/21 4782   multivitamin with minerals tablet 1 tablet  1 tablet Oral Daily Edwin Dada, MD   1 tablet at 05/24/21 1202   ondansetron (ZOFRAN) tablet 4 mg  4 mg Oral Q6H PRN Athena Masse, MD       Or   ondansetron [facility]) injection 4 mg  4 mg Intravenous Q6H PRN Athena Masse, MD       pantoprazole (PROTONIX) EC tablet 40 mg  40 mg Oral BID Athena Masse, MD   40 mg at 05/24/21 9562   spironolactone (ALDACTONE) tablet  100 mg  100 mg Oral Daily Athena Masse, MD   100 mg at 05/24/21 1308   Facility-Administered Medications Ordered in Other Encounters  Medication Dose Route Frequency Provider Last Rate Last Admin   sodium chloride flush (NS) 0.9 % injection 10 mL  10 mL Intravenous PRN Lequita Asal, MD   10 mL at 09/14/20 1320     Discharge Medications: Please see discharge summary for a list of discharge medications.  Relevant Imaging Results:  Relevant Lab Results:   Additional Information SS# [id number]  Su Hilt, RN

## 2021-05-24 NOTE — Progress Notes (Signed)
Initial Nutrition Assessment  DOCUMENTATION CODES:  Obesity unspecified  INTERVENTION:  Add 30 ml ProSource Plus po BID, each supplement provides 100 kcal and 15 grams of protein.    Add MVI with minerals daily.  NUTRITION DIAGNOSIS:  Increased nutrient needs related to post-op healing as evidenced by estimated needs.  GOAL:  Patient will meet greater than or equal to 90% of their needs  MONITOR:  PO intake, Supplement acceptance, Labs, Weight trends, I & O's  REASON FOR ASSESSMENT:  Consult Hip fracture protocol  ASSESSMENT:  70 yo female with a PMH of cirrhosis secondary to NASH s/p TIPS, T2DM, PUD, D CHF, CKD stage 4, and history of GI bleed secondary to angiodysplasia with recent EGD 6/16 who presents with R femur fracture.  Spoke with pt at bedside. She reports eating well at home, eating mostly whatever she wants. Observed her breakfast tray - pt ate 90% of breakfast.  Per Epic, pt has gained weight from 02/2021 to 05/01/2021. Pt then gained 10 kg between 6/19 and 6/29. Weight had then decreased back down 10 kg on 7/8. This may have been a fluid shift as it is unlikely for a patient to gain and lose 10 kg within a week of true weight.  Pt losing fluid weight with diuretics.   On exam, pt had no depletions.  Recommend adding ProSource Plus TID to promote protein intake for post-operative healing for femur.  Medications: reviewed; Lasix BID, SSI, lactulose BID, Protonix BID, spironolactone, morphine PRN (given twice today)  Labs: reviewed; Na 125 (L), K 3 (L), CBG 64-130 HbA1c: 5.6% (05/2021)  NUTRITION - FOCUSED PHYSICAL EXAM: Flowsheet Row Most Recent Value  Orbital Region No depletion  Upper Arm Region No depletion  Thoracic and Lumbar Region No depletion  Buccal Region No depletion  Temple Region No depletion  Clavicle Bone Region No depletion  Clavicle and Acromion Bone Region No depletion  Scapular Bone Region No depletion  Dorsal Hand No depletion  Patellar  Region No depletion  Anterior Thigh Region No depletion  Posterior Calf Region No depletion  Edema (RD Assessment) Moderate  [LLE]  Hair Reviewed  Eyes Reviewed  Mouth Reviewed  Skin Reviewed  Nails Reviewed   Diet Order:   Diet Order             Diet heart healthy/carb modified Room service appropriate? Yes; Fluid consistency: Thin  Diet effective now                  EDUCATION NEEDS:  Education needs have been addressed  Skin:  Skin Assessment: Reviewed RN Assessment (Skin red on LLE)  Last BM:  05/23/21  Height:  Ht Readings from Last 1 Encounters:  05/23/21 5' 2" (1.575 m)   Weight:  Wt Readings from Last 1 Encounters:  05/23/21 98.8 kg   BMI:  Body mass index is 39.84 kg/m.  Estimated Nutritional Needs:  Kcal:  1700-1900 Protein:  85-100 grams Fluid:  >1.7 L  Derrel Nip, RD, LDN (she/her/hers) Registered Dietitian I After-Hours/Weekend Pager # in [place]

## 2021-05-24 NOTE — Progress Notes (Signed)
Met with the patient and her friend in the room to discuss DC plan and needs She lives at home with her friend that is to have surgery on the 26th, she was supposed to act as the friend's caregiver after their surgery, She is not going to have assistance at home for some time until the friend heals after surgery to be able to care for her. She stated that she is going to need to go to [facility].  She has had the covid vaccines and 1 booster. PASSR obtained, FL2 completed, bedsearch sent

## 2021-05-24 NOTE — Plan of Care (Signed)
  Problem: Elimination: Goal: Will not experience complications related to bowel motility Outcome: Completed/Met   Problem: Pain Managment: Goal: General experience of comfort will improve Outcome: Completed/Met   Problem: Safety: Goal: Ability to remain free from injury will improve Outcome: Not Met (add Reason)

## 2021-05-24 NOTE — Evaluation (Signed)
Physical Therapy Evaluation Patient Details Name: Crystal [name] MRN: [id number] DOB: 1951/08/16 Today's Date: 05/24/2021   History of Present Illness  Pt is a 70 y.o. 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 CT knee showed nondisplaced fracture distal right femur with suprapatellar hemarthrosis.  Pt cleared for initial evaluation by hospitalist and NWB precautions with KI donned on RLE. Pt seen and treated conservatively pending further assessment from orthopedic consult.   Clinical Impression  Patient A&Ox4, reported 9/10 pain in RLE at rest, premedicated prior to session. She reported at baseline she is modI for ambulation with RW/cane as needed, and lives with a friend. Pt is able to drive and perform IADLs, but friend does assist as needed. Per pt, friend is supposed to have knee surgery on 7/26.  Pt performed several supine exercises for LLE with verbal/tactile cueing. Unable to tolerate any movement of RLE due to significantly increased pain; pt with yelling/moaning with attempts. KI adjusted as needed for improved fit during session as well. Further mobility deferred due to patient's pain as well as lethargy.  Overall the patient demonstrated deficits (see "PT Problem List") that impede the patient's functional abilities, safety, and mobility and would benefit from skilled PT intervention. Recommendation is SNF due to current level of assistance needed and decreased caregiver assistance at home.    Follow Up Recommendations SNF    Equipment Recommendations  Other (comment) (anticipate need for WC, but TBD at next venue of care)    Recommendations for Other Services       Precautions / Restrictions Precautions Precautions: Fall Required Braces or Orthoses: Knee Immobilizer -  Right Restrictions Weight Bearing Restrictions: Yes RLE Weight Bearing: Non weight bearing      Mobility  Bed Mobility               General bed mobility comments: pt with moaning, exclaiming with RLE movement attempts, unable to tolerate at this time    Transfers                    Ambulation/Gait                Stairs            Wheelchair Mobility    Modified Rankin (Stroke Patients Only)       Balance                                             Pertinent Vitals/Pain Pain Assessment: 0-10 Pain Score: 9  Pain Location: R knee Pain Descriptors / Indicators: Sore;Grimacing;Moaning Pain Intervention(s): Limited activity within patient's tolerance;Monitored during session;Repositioned;Patient requesting pain meds-RN notified;Premedicated before session    Home Living Family/patient expects to be discharged to:: Private residence Living Arrangements: Non-relatives/Friends Available Help at Discharge: Available 24 hours/day (but assist will be unavailable after 7/26 due to friends' personal surgery) Type of Home: House Home Access: Ramped entrance     Home Layout: [place]: [place] - 2 wheels;Walker - 4 wheels;Cane - single point;Bedside commode      Prior Function Level of Independence: Needs assistance   Gait / Transfers Assistance Needed: Pt walks short community distances with RW  or Rollator but no more than she needs to. no other falls in the last 6 months  ADL's / Homemaking Assistance Needed: Pt requires assistance from friend to cook and clean, friend also does most of the driving        Hand Dominance   Dominant Hand: Right    Extremity/Trunk Assessment   Upper Extremity Assessment Upper Extremity Assessment: RUE deficits/detail;LUE deficits/detail RUE Deficits / Details: grossly 4-/5, difficulty with overhead movements due to  chest pain LUE Deficits / Details: grossly 4-/5,  difficulty with overhead movements due to  chest pain    Lower Extremity Assessment Lower Extremity Assessment: LLE deficits/detail;RLE deficits/detail RLE: Unable to fully assess due to pain;Unable to fully assess due to immobilization LLE Deficits / Details: able to lift against gravity without assist, DF/PF       Communication   Communication: No difficulties  Cognition Arousal/Alertness: Awake/alert;Lethargic Behavior During Therapy: WFL for tasks assessed/performed Overall Cognitive Status: Within Functional Limits for tasks assessed                                 General Comments: pt did start to fall asleep towards end of session      General Comments      Exercises General Exercises - Lower Extremity Ankle Circles/Pumps: AROM;Both;10 reps Short Arc Quad: AROM;Strengthening;Left;10 reps Heel Slides: AROM;Strengthening;Left;10 reps Hip ABduction/ADduction: AROM;Strengthening;Left;10 reps Straight Leg Raises: AROM;Strengthening;Left;5 reps   Assessment/Plan    PT Assessment Patient needs continued PT services  PT Problem List Decreased strength;Decreased range of motion;Decreased activity tolerance;Decreased balance;Decreased mobility;Decreased knowledge of precautions;Pain;Decreased knowledge of use of DME       PT Treatment Interventions DME instruction;Balance training;Gait training;Neuromuscular re-education;Stair training;Functional mobility training;Patient/family education;Therapeutic activities;Therapeutic exercise;Wheelchair mobility training    PT Goals (Current goals can be found in the Care Plan section)  Acute Rehab PT Goals Patient Stated Goal: to go home when she can PT Goal Formulation: With patient Time For Goal Achievement: 06/07/21 Potential to Achieve Goals: Good Additional Goals Additional Goal #1: Pt will navigate environment modI with WC to demonstrate safe mobility for household mobility.    Frequency 7X/week   Barriers  to discharge Decreased caregiver support      Co-evaluation               AM-PAC PT "6 Clicks" Mobility  Outcome Measure Help needed turning from your back to your side while in a flat bed without using bedrails?: Total Help needed moving from lying on your back to sitting on the side of a flat bed without using bedrails?: Total Help needed moving to and from a bed to a chair (including a wheelchair)?: Total Help needed standing up from a chair using your arms (e.g., wheelchair or bedside chair)?: Total Help needed to walk in hospital room?: Total Help needed climbing 3-5 steps with a railing? : Total 6 Click Score: 6    End of Session   Activity Tolerance: Patient limited by pain;Patient limited by fatigue Patient left: in bed;with call bell/phone within reach;with bed alarm set Nurse Communication: Mobility status PT Visit Diagnosis: Unsteadiness on feet (R26.81);Other abnormalities of gait and mobility (R26.89);Pain;Difficulty in walking, not elsewhere classified (R26.2);Muscle weakness (generalized) (M62.81) Pain - Right/Left: Right Pain - part of body: Knee;Leg    Time: 1031-1059 PT Time Calculation (min) (ACUTE ONLY): 28 min   Charges:   PT Evaluation $PT Eval Low Complexity: 1 Low PT Treatments $Therapeutic  Exercise: 8-22 mins       Lieutenant Diego PT, DPT 11:15 AM,05/24/21

## 2021-05-24 NOTE — Progress Notes (Signed)
[facility] PROGRESS NOTE    Crystal [name]  LJQ:[id number] DOB: Apr 30, 1951 DOA: 05/23/2021 PCP: Ileene Hutchinson, PA      Brief Narrative:  [name] is a 70 y.o. 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  Patient usually ambulates with a walker, but was using her cane and tripped over her shoe.  No loss of consciousness, no preceding chest pain, palpitations, dizziness.    In the ER, CT showed nondisplaced femur fracture, distal right femur with suprapatellar hemarthrosis.  Orthopedics were consulted and the hospitalist service were asked to evaluate.         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 - Continue home furosemide, metolazone, and spironolactone - Potassium  Hypokalemia -Supplement potassium - Check magnesium  Hyponatremia Mild, due to cirrhosis.  Asymptomatic.  Anemia of chronic disease Hemoglobin improved from previous, no clinical bleeding recently  Thrombocytopenia Mild due to cirrhosis  Recent GI bleed -Continue PPI         Disposition: Status is: Inpatient  Remains inpatient appropriate because:Unsafe d/c plan  Dispo: The patient is from: Home              Anticipated d/c is to: SNF              Patient currently is not medically stable to d/c.   Difficult to place patient No       Level of care: Med-Surg       MDM: The below labs and imaging reports were reviewed and summarized above.  Medication  management as above.      DVT prophylaxis: SCDs Start: 05/23/21 2334 Code Status: FULL Family Communication:           Subjective: No fever, headache, chest pain, dyspnea, orthopnea.  She has swelling in her left leg, this is about like her normal.  She has some cough and some sharp chest pain after coughing, but this was mild.  Pain in her right knee limits movement.  Objective: Vitals:   05/24/21 0047 05/24/21 0432 05/24/21 0805 05/24/21 1504  BP: 131/69 121/63 119/63 (!) 108/48  Pulse: 92 72 82 64  Resp: _0 Temp: 98.1 F (36.7 C) (!) 97.5 F (36.4 C) 97.8 F (36.6 C) 97.7 F (36.5 C)  TempSrc: Oral Oral  Oral  SpO2: 98% 100% (!) 87%   Weight:      Height:        Intake/Output Summary (Last 24 hours) at 05/24/2021 1610 Last data filed at 05/24/2021 1346 Gross per 24 hour  Intake 120 ml  Output 50 ml  Net 70 ml   Filed Weights   05/23/21 1631  Weight: 98.8 kg    Examination: General appearance: Obese adult female, alert and in no acute distress.  Lying in bed. HEENT: Anicteric, conjunctiva pink, lids and lashes normal. No nasal deformity, discharge, epistaxis.  Lips moist, dentition in good repair, oropharynx moist, no oral lesions.   Skin: Warm and dry.  no jaundice.  No suspicious rashes or lesions.  Mild redness  of the left lower extremity, consistent with venous insufficiency. Cardiac: RRR, nl S1-S2, no murmurs appreciated.  Capillary refill is brisk.  JVP not visible.  1+ LE edema left leg.  Radial  pulses 2+ and symmetric. Respiratory: Normal respiratory rate and rhythm.  CTAB without rales or wheezes. Abdomen: Abdomen soft.  No TTP or guarding. No ascites, distension, hepatosplenomegaly.   MSK: No deformities or effusions.  Right knee is in immobilizer. Neuro: Awake and alert.  EOMI, moves upper extremities with normal strength and coordination, lower extremity strength not tested due to pain. Speech fluent.    Psych: Sensorium intact and  responding to questions, attention normal. Affect normal.  Judgment and insight appear normal.    Data Reviewed: I have personally reviewed following labs and imaging studies:  CBC: Recent Labs  Lab 05/19/21 1432 05/23/21 2240  WBC 4.3 7.2  NEUTROABS 2.4 5.8  HGB 8.4* 9.8*  HCT 25.5* 29.9*  MCV 82.0 83.3  PLT 133* 664*   Basic Metabolic Panel: Recent Labs  Lab 05/19/21 1432 05/23/21 2240  NA 127* 125*  K 3.4* 3.0*  CL 87* 83*  CO2 30 28  GLUCOSE 109* 164*  BUN 28* 29*  CREATININE 1.87* 1.88*  CALCIUM 8.6* 9.0   GFR: Estimated Creatinine Clearance: 31 mL/min (A) (by C-G formula based on SCr of 1.88 mg/dL (H)). Liver Function Tests: Recent Labs  Lab 05/19/21 1432  AST 24  ALT 10  ALKPHOS 127*  BILITOT 1.5*  PROT 5.5*  ALBUMIN 2.0*   No results for input(s): LIPASE, AMYLASE in the last 168 hours. No results for input(s): AMMONIA in the last 168 hours. Coagulation Profile: No results for input(s): INR, PROTIME in the last 168 hours. Cardiac Enzymes: No results for input(s): CKTOTAL, CKMB, CKMBINDEX, TROPONINI in the last 168 hours. BNP (last 3 results) No results for input(s): PROBNP in the last 8760 hours. HbA1C: No results for input(s): HGBA1C in the last 72 hours. CBG: Recent Labs  Lab 05/24/21 0834 05/24/21 0839 05/24/21 1210 05/24/21 1214 05/24/21 1602  GLUCAP 64* 130* 81 148* 136*   Lipid Profile: No results for input(s): CHOL, HDL, LDLCALC, TRIG, CHOLHDL, LDLDIRECT in the last 72 hours. Thyroid Function Tests: No results for input(s): TSH, T4TOTAL, FREET4, T3FREE, THYROIDAB in the last 72 hours. Anemia Panel: No results for input(s): VITAMINB12, FOLATE, FERRITIN, TIBC, IRON, RETICCTPCT in the last 72 hours. Urine analysis:    Component Value Date/Time   COLORURINE STRAW (A) 04/04/2021 0127   APPEARANCEUR CLEAR (A) 04/04/2021 0127   LABSPEC 1.006 04/04/2021 0127   PHURINE 9.0 (H) 04/04/2021 0127   GLUCOSEU NEGATIVE 04/04/2021 0127    HGBUR NEGATIVE 04/04/2021 0127   BILIRUBINUR NEGATIVE 04/04/2021 0127   KETONESUR NEGATIVE 04/04/2021 0127   PROTEINUR NEGATIVE 04/04/2021 0127   NITRITE NEGATIVE 04/04/2021 0127   LEUKOCYTESUR LARGE (A) 04/04/2021 0127   Sepsis Labs: _0 (procalcitonin:4,lacticacidven:4)  ) Recent Results (from the past 240 hour(s))  Resp Panel by RT-PCR (Flu A&B, Covid) Nasopharyngeal Swab     Status: None   Collection Time: 05/23/21 10:50 PM   Specimen: Nasopharyngeal Swab; Nasopharyngeal(NP) swabs in vial transport medium  Result Value Ref Range Status   SARS Coronavirus 2 by RT PCR NEGATIVE NEGATIVE Final    Comment: (NOTE) SARS-CoV-2 target nucleic acids are NOT DETECTED.  The SARS-CoV-2 RNA is generally detectable in upper respiratory specimens during the acute phase of infection. The lowest concentration of SARS-CoV-2 viral copies this assay can detect is 138 copies/mL. A negative result  does not preclude SARS-Cov-2 infection and should not be used as the sole basis for treatment or other patient management decisions. A negative result may occur with  improper specimen collection/handling, submission of specimen other than nasopharyngeal swab, presence of viral mutation(s) within the areas targeted by this assay, and inadequate number of viral copies(<138 copies/mL). A negative result must be combined with clinical observations, patient history, and epidemiological information. The expected result is Negative.  Fact Sheet for Patients:  EntrepreneurPulse.com.au  Fact Sheet for Healthcare Providers:  IncredibleEmployment.be  This test is no t yet approved or cleared by the [place] FDA and  has been authorized for detection and/or diagnosis of SARS-CoV-2 by FDA under an Emergency Use Authorization (EUA). This EUA will remain  in effect (meaning this test can be used) for the duration of the COVID-19 declaration under Section 564(b)(1) of  the Act, 21 U.S.C.section 360bbb-3(b)(1), unless the authorization is terminated  or revoked sooner.       Influenza A by PCR NEGATIVE NEGATIVE Final   Influenza B by PCR NEGATIVE NEGATIVE Final    Comment: (NOTE) The Xpert Xpress SARS-CoV-2/FLU/RSV plus assay is intended as an aid in the diagnosis of influenza from Nasopharyngeal swab specimens and should not be used as a sole basis for treatment. Nasal washings and aspirates are unacceptable for Xpert Xpress SARS-CoV-2/FLU/RSV testing.  Fact Sheet for Patients: EntrepreneurPulse.com.au  Fact Sheet for Healthcare Providers: IncredibleEmployment.be  This test is not yet approved or cleared by the [place] FDA and has been authorized for detection and/or diagnosis of SARS-CoV-2 by FDA under an Emergency Use Authorization (EUA). This EUA will remain in effect (meaning this test can be used) for the duration of the COVID-19 declaration under Section 564(b)(1) of the Act, 21 U.S.C. section 360bbb-3(b)(1), unless the authorization is terminated or revoked.  Performed at [facility], [address] [place], [place] [address]          Radiology Studies: CT Head Wo Contrast  Result Date: 05/23/2021 CLINICAL DATA:  Pain after fall EXAM: CT HEAD WITHOUT CONTRAST CT CERVICAL SPINE WITHOUT CONTRAST TECHNIQUE: Multidetector CT imaging of the head and cervical spine was performed following the standard protocol without intravenous contrast. Multiplanar CT image reconstructions of the cervical spine were also generated. COMPARISON:  Apr 04, 2021 FINDINGS: CT HEAD FINDINGS Brain: Similar mild age related global parenchymal volume loss. Stable mild burden of chronic ischemic white matter microvascular disease. No evidence of acute large vascular territory infarction, hemorrhage, hydrocephalus, extra-axial collection or mass lesion/mass effect. Vascular: No hyperdense vessel. Atherosclerotic  calcifications of the internal carotid and vertebral arteries at the skull base. Skull: Normal. Negative for fracture or focal lesion. Sinuses/Orbits: Visualized portions of the paranasal sinuses and mastoid air cells are predominantly clear. Orbits are grossly unremarkable. Other: None CT CERVICAL SPINE FINDINGS Alignment: Mild loss of the normal cervical lordosis. No evidence of traumatic listhesis. 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 No acute fracture. Soft tissues and spinal canal: No prevertebral fluid or swelling. No visible canal hematoma. Disc levels: Moderate multilevel discogenic disease most notable in the lower cervical spine. Upper chest: No acute abnormality. Other: Calcifications in the bilateral parotid glands. Carotid artery calcifications. Partially visualized right chest wall Port-A-Cath. IMPRESSION: 1. No evidence of acute intracranial process. 2. No evidence of acute fracture or traumatic listhesis of the cervical spine. 3. Moderate multilevel discogenic disease of the  cervical spine. 4. 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 Electronically Signed   By: Dahlia Bailiff MD   On: 05/23/2021 17:53   CT Cervical Spine Wo Contrast  Result Date: 05/23/2021 CLINICAL DATA:  Pain after fall EXAM: CT HEAD WITHOUT CONTRAST CT CERVICAL SPINE WITHOUT CONTRAST TECHNIQUE: Multidetector CT imaging of the head and cervical spine was performed following the standard protocol without intravenous contrast. Multiplanar CT image reconstructions of the cervical spine were also generated. COMPARISON:  Apr 04, 2021 FINDINGS: CT HEAD FINDINGS Brain: Similar mild age related global parenchymal volume loss. Stable mild burden of chronic ischemic white matter microvascular disease. No evidence of acute large vascular  territory infarction, hemorrhage, hydrocephalus, extra-axial collection or mass lesion/mass effect. Vascular: No hyperdense vessel. Atherosclerotic calcifications of the internal carotid and vertebral arteries at the skull base. Skull: Normal. Negative for fracture or focal lesion. Sinuses/Orbits: Visualized portions of the paranasal sinuses and mastoid air cells are predominantly clear. Orbits are grossly unremarkable. Other: None CT CERVICAL SPINE FINDINGS Alignment: Mild loss of the normal cervical lordosis. No evidence of traumatic listhesis. 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 No acute fracture. Soft tissues and spinal canal: No prevertebral fluid or swelling. No visible canal hematoma. Disc levels: Moderate multilevel discogenic disease most notable in the lower cervical spine. Upper chest: No acute abnormality. Other: Calcifications in the bilateral parotid glands. Carotid artery calcifications. Partially visualized right chest wall Port-A-Cath. IMPRESSION: 1. No evidence of acute intracranial process. 2. No evidence of acute fracture or traumatic listhesis of the cervical spine. 3. 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 4. 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 Electronically Signed   By: Dahlia Bailiff MD   On: 05/23/2021 17:53   CT Knee Right Wo Contrast  Result Date: 05/23/2021 CLINICAL DATA:  Fall EXAM: CT OF THE RIGHT KNEE WITHOUT CONTRAST TECHNIQUE: Multidetector CT imaging of the right knee was performed according to the standard protocol. Multiplanar CT image reconstructions were also generated. COMPARISON:  None. FINDINGS: There is a nondisplaced fracture of the distal right femur with fracture lines extending to the supracondylar lateral femoral surface. Fracture line of the  middle portion of the distal femoral shaft extends to intercondylar surface. Fracture lines best delineated on series 4 and series 101. Right suprapatellar hemarthrosis. IMPRESSION: 1. Nondisplaced fracture of the distal right femur with fracture lines extending to the supracondylar lateral femoral cortex and intercondylar surface. 2. Right suprapatellar hemarthrosis. Electronically Signed   By: Ulyses Jarred M.D.   On: 05/23/2021 21:23   DG Chest Portable 1 View  Result Date: 05/23/2021 CLINICAL DATA:  Fall with blunt chest trauma EXAM: PORTABLE CHEST 1 VIEW COMPARISON:  Radiograph 04/29/2021, CT 02/01/2021 FINDINGS: Chronically coarsened interstitial and bronchitic features. More bandlike areas of opacity in the lung bases likely corresponding to areas of scarring in regions of as well as more nodular opacity in the right mid to lower lung no new consolidative process. No pneumothorax or effusion. No acute traumatic findings of the chest wall. Port-A-Cath projects over the right chest with tip in the mid SVC. Stable cardiomediastinal contours. IMPRESSION: No acute cardiopulmonary or traumatic findings. Ill-defined nodular opacities in the right lung base may reflect regions of masslike opacity seen on comparison CT and radiography.  Difficult to fully assess interval change on portable radiographs. Electronically Signed   By: Lovena Le M.D.   On: 05/23/2021 19:16   DG Knee Complete 4 Views Right  Result Date: 05/23/2021 CLINICAL DATA:  Pain after fall EXAM: RIGHT KNEE - COMPLETE 4+ VIEW COMPARISON:  None. FINDINGS: No evidence of fracture or dislocation. Moderate suprapatellar joint effusion with overlying soft tissue swelling. Patellar enthesophyte. Mild tricompartment degenerative change. Vascular calcifications. IMPRESSION: 1. No acute osseous abnormality. 2. Moderate suprapatellar joint effusion with overlying soft tissue swelling 3. Mild tricompartment degenerative change. Electronically Signed    By: Dahlia Bailiff MD   On: 05/23/2021 17:56        Scheduled Meds:  (feeding supplement) PROSource Plus  30 mL Oral TID BM   acetaminophen  1,000 mg Oral TID   Chlorhexidine Gluconate Cloth  6 each Topical Daily   furosemide  80 mg Oral BID   insulin aspart  0-20 Units Subcutaneous Q4H   lactulose  20 g Oral BID   metolazone  5 mg Oral Daily   multivitamin with minerals  1 tablet Oral Daily   pantoprazole  40 mg Oral BID   potassium chloride  40 mEq Oral BID   spironolactone  100 mg Oral Daily   Continuous Infusions:   LOS: 1 day    Time spent: 25 minutes    Edwin Dada, MD [facility] 05/24/2021, 4:10 PM     Please page though [facility] or Epic secure chat:  For [employer], Adult nurse

## 2021-05-25 ENCOUNTER — Encounter: Payer: Self-pay | Admitting: Internal Medicine

## 2021-05-25 DIAGNOSIS — N184 Chronic kidney disease, stage 4 (severe): Secondary | ICD-10-CM

## 2021-05-25 DIAGNOSIS — K7581 Nonalcoholic steatohepatitis (NASH): Secondary | ICD-10-CM

## 2021-05-25 DIAGNOSIS — K7460 Unspecified cirrhosis of liver: Secondary | ICD-10-CM

## 2021-05-25 DIAGNOSIS — S72401D Unspecified fracture of lower end of right femur, subsequent encounter for closed fracture with routine healing: Secondary | ICD-10-CM

## 2021-05-25 DIAGNOSIS — I5032 Chronic diastolic (congestive) heart failure: Secondary | ICD-10-CM

## 2021-05-25 DIAGNOSIS — Z95828 Presence of other vascular implants and grafts: Secondary | ICD-10-CM

## 2021-05-25 LAB — GLUCOSE, CAPILLARY
Glucose-Capillary: 159 mg/dL — ABNORMAL HIGH (ref 70–99)
Glucose-Capillary: 170 mg/dL — ABNORMAL HIGH (ref 70–99)
Glucose-Capillary: 170 mg/dL — ABNORMAL HIGH (ref 70–99)
Glucose-Capillary: 171 mg/dL — ABNORMAL HIGH (ref 70–99)
Glucose-Capillary: 175 mg/dL — ABNORMAL HIGH (ref 70–99)
Glucose-Capillary: 177 mg/dL — ABNORMAL HIGH (ref 70–99)

## 2021-05-25 LAB — TSH: TSH: 4.707 u[IU]/mL — ABNORMAL HIGH (ref 0.350–4.500)

## 2021-05-25 LAB — CBC
HCT: 29.3 % — ABNORMAL LOW (ref 36.0–46.0)
Hemoglobin: 9.7 g/dL — ABNORMAL LOW (ref 12.0–15.0)
MCH: 26.8 pg (ref 26.0–34.0)
MCHC: 33.1 g/dL (ref 30.0–36.0)
MCV: 80.9 fL (ref 80.0–100.0)
Platelets: 120 10*3/uL — ABNORMAL LOW (ref 150–400)
RBC: 3.62 MIL/uL — ABNORMAL LOW (ref 3.87–5.11)
RDW: 18.3 % — ABNORMAL HIGH (ref 11.5–15.5)
WBC: 8.4 10*3/uL (ref 4.0–10.5)
nRBC: 0 % (ref 0.0–0.2)

## 2021-05-25 LAB — BASIC METABOLIC PANEL
Anion gap: 8 (ref 5–15)
BUN: 36 mg/dL — ABNORMAL HIGH (ref 8–23)
CO2: 31 mmol/L (ref 22–32)
Calcium: 8.7 mg/dL — ABNORMAL LOW (ref 8.9–10.3)
Chloride: 85 mmol/L — ABNORMAL LOW (ref 98–111)
Creatinine, Ser: 1.85 mg/dL — ABNORMAL HIGH (ref 0.44–1.00)
GFR, Estimated: 29 mL/min — ABNORMAL LOW (ref >60)
Glucose, Bld: 163 mg/dL — ABNORMAL HIGH (ref 70–99)
Potassium: 3.5 mmol/L (ref 3.5–5.1)
Sodium: 124 mmol/L — ABNORMAL LOW (ref 135–145)

## 2021-05-25 LAB — AMMONIA: Ammonia: 59 umol/L — ABNORMAL HIGH (ref 9–35)

## 2021-05-25 LAB — MAGNESIUM: Magnesium: 1.8 mg/dL (ref 1.7–2.4)

## 2021-05-25 MED ORDER — MAGNESIUM OXIDE -MG SUPPLEMENT 400 (240 MG) MG PO TABS
400.0000 mg | ORAL_TABLET | Freq: Two times a day (BID) | ORAL | Status: DC
  Administered 2021-05-25 – 2021-05-28 (×6): 400 mg via ORAL
  Filled 2021-05-25 (×6): qty 1

## 2021-05-25 MED ORDER — LACTULOSE 10 GM/15ML PO SOLN
20.0000 g | Freq: Three times a day (TID) | ORAL | Status: DC
  Administered 2021-05-25 – 2021-05-26 (×3): 20 g via ORAL
  Filled 2021-05-25 (×3): qty 30

## 2021-05-25 MED ORDER — RIFAXIMIN 550 MG PO TABS
550.0000 mg | ORAL_TABLET | Freq: Two times a day (BID) | ORAL | Status: DC
  Administered 2021-05-25 – 2021-06-03 (×18): 550 mg via ORAL
  Filled 2021-05-25 (×18): qty 1

## 2021-05-25 NOTE — Progress Notes (Signed)
Physical Therapy Treatment Patient Details Name: Crystal [name] MRN: [id number] DOB: [birth year] Today's Date: 05/25/2021    History of Present Illness Pt is a 70 y.o. 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 CT knee showed nondisplaced fracture distal right femur with suprapatellar hemarthrosis.  Per orthopedic consult, pt to be NWB on RLE due to conservative management of fracture.    PT Comments    Pt lethargic throughout session, able to follow one step commands inconsistently, with extended time and repetition. Premedicated prior to PT session. Reported 8/10 pain at rest in RLE, and grimaces/moans with any UE movement attempts. Pt with screaming, stating "stop stop stop stop" with any RLE movement. AAROM/PROM attempted of BUE and LLE to promote mobility and strength, pt with limited participation and falling asleep throughout. The patient would benefit from further skilled PT intervention to progress towards goals as able and maximize function.    Follow Up Recommendations  SNF     Equipment Recommendations  Other (comment) (TBD at next venue of care)    Recommendations for Other Services       Precautions / Restrictions Precautions Precautions: Fall Required Braces or Orthoses: Knee Immobilizer - Right Restrictions Weight Bearing Restrictions: Yes RLE Weight Bearing: Non weight bearing    Mobility  Bed Mobility               General bed mobility comments: unable at this time due to pain    Transfers                    Ambulation/Gait                 Stairs             Wheelchair Mobility    Modified Rankin (Stroke Patients Only)       Balance                                            Cognition Arousal/Alertness:  Lethargic Behavior During Therapy: Flat affect                                   General Comments: pt sleepy/lethargic throughout sesssion. Able to follow one step commands inconsistently, with extended time and repetition      Exercises Other Exercises Other Exercises: PROM R leg hip abduction/adduction x4, pt unable to tolerate, screaming, moaning, "stop stop stop stop" throughout. PROM-AAROM LLE heel slides, hip abduction/adduction. Other Exercises: Attempted UE movement as well, PROM with occasional AAROM with multimodal constant cues to attend to task    General Comments        Pertinent Vitals/Pain Pain Assessment: 0-10 Pain Score: 8  Pain Location: R knee Pain Descriptors / Indicators: Sore;Grimacing;Moaning;Guarding Pain Intervention(s): Limited activity within patient's tolerance;Monitored during session;Premedicated before session;Repositioned;Patient requesting pain meds-RN notified    Home Living                      Prior Function            PT Goals (current goals can now be found in the  care plan section) Progress towards PT goals: Progressing toward goals    Frequency    7X/week      PT Plan Current plan remains appropriate    Co-evaluation              AM-PAC PT "6 Clicks" Mobility   Outcome Measure  Help needed turning from your back to your side while in a flat bed without using bedrails?: Total Help needed moving from lying on your back to sitting on the side of a flat bed without using bedrails?: Total Help needed moving to and from a bed to a chair (including a wheelchair)?: Total Help needed standing up from a chair using your arms (e.g., wheelchair or bedside chair)?: Total Help needed to walk in hospital room?: Total Help needed climbing 3-5 steps with a railing? : Total 6 Click Score: 6    End of Session   Activity Tolerance: Patient limited by lethargy;Patient limited by pain Patient left: in bed;with  call bell/phone within reach;with bed alarm set;with family/visitor present Nurse Communication: Mobility status;Patient requests pain meds PT Visit Diagnosis: Unsteadiness on feet (R26.81);Other abnormalities of gait and mobility (R26.89);Pain;Difficulty in walking, not elsewhere classified (R26.2);Muscle weakness (generalized) (M62.81) Pain - Right/Left: Right Pain - part of body: Knee;Leg     Time: 1761-6073 PT Time Calculation (min) (ACUTE ONLY): 24 min  Charges:  $Therapeutic Exercise: 23-37 mins                    Lieutenant Diego PT, DPT 11:04 AM,05/25/21

## 2021-05-25 NOTE — Progress Notes (Signed)
PROGRESS NOTE    Crystal [name]  TKW:[id number] DOB: Dec 29, 1950 DOA: 05/23/2021 PCP: Ileene Hutchinson, PA    Chief Complaint  Patient presents with   Fall    Brief Narrative:  Crystal [name] is a 70 y.o. 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   Patient usually ambulates with a walker, but was using her cane and tripped over her shoe.  No loss of consciousness, no preceding chest pain, palpitations, dizziness.     In the ER, CT showed nondisplaced femur fracture, distal right femur with suprapatellar hemarthrosis.  Orthopedics were consulted and the hospitalist service were asked to evaluate. Assessment & Plan:   Principal Problem:   Fracture of femur, distal, right, closed ([place]) Active Problems:   S/P TIPS (transjugular intrahepatic portosystemic shunt)   Coronary artery disease involving native coronary artery of native heart without angina pectoris   Liver cirrhosis secondary to NASH (HCC)   Chronic diastolic CHF (congestive heart failure) (HCC)   Diabetes mellitus, type 2 (HCC)   Chronic kidney disease, stage IV (severe) (HCC)   Closed fracture of distal end of right femur ([place])   Accidental fall   Mechanical fall leading to distal femur fracture on the right -Orthopedics consulted recommended nonsurgical management at this time. 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  Metolazone held as sodium level continues to drop.   Stage IV CKD Creatinine remained stable at 1.8    Chronic diastolic heart failure Appears compensated at this time Patient currently denies any  shortness of breath or chest pain but lower extremity edema present.   Hypokalemia replaced   Moderate hyponatremia  Suspect a combination of cirrhosis and diuretic use. Get TSH, serum osmolarity and urine osmolarity and if sodium level continues to drop, will request nephrology.  Anemia of chronic disease No bleeding evident Transfuse to keep hemoglobin greater than 7.    Thrombocytopenia Probably secondary to cirrhosis Continue to monitor.   Diabetes mellitus CBG (last 3)  Recent Labs    05/25/21 1116 05/25/21 1244 05/25/21 1613  GLUCAP 175* 177* 171*   Continue with sliding scale insulin optimally controlled    DVT prophylaxis: SCD'S Code Status: fULL CODE Family Communication: (Specify name, relationship & date discussed. NO "discussed with patient") Disposition:   Status is: Inpatient  Remains inpatient appropriate because:Ongoing diagnostic testing needed not appropriate for outpatient work up, Unsafe d/c plan, and IV treatments appropriate due to intensity of illness or inability to take PO  Dispo: The patient is from: Home              Anticipated d/c is to: SNF              Patient currently is not medically stable to d/c.   Difficult to place patient No       Consultants:  Orthopedics   Procedures: none.   Antimicrobials: ( Antibiotics Given (last 72 hours)     None         Subjective: Lethargic.   Objective: Vitals:   05/25/21 0551 05/25/21 0727 05/25/21 1113 05/25/21 1611  BP: (!) 116/57 (!) 116/53 (!) 115/56 Marland Kitchen)  119/57  Pulse: 82 81 79 74  Resp: _0 Temp: 99.5 F (37.5 C) 98.3 F (36.8 C) 98.1 F (36.7 C) 98.5 F (36.9 C)  TempSrc: Oral  Oral   SpO2: 90% 90% 93% 91%  Weight:      Height:        Intake/Output Summary (Last 24 hours) at 05/25/2021 1738 Last data filed at 05/25/2021 1401 Gross per 24 hour  Intake 240 ml  Output 1400 ml  Net -1160 ml   Filed Weights   05/23/21 1631  Weight: 98.8 kg     Examination:  General exam: Appears calm and comfortable  Respiratory system: Clear to auscultation. Respiratory effort normal. Cardiovascular system: S1 & S2 heard, RRR. No JVD,  Gastrointestinal system: Abdomen is nondistended, soft and nontender.  Normal bowel sounds heard. Central nervous system: sleepy , able to answer simple questions and falls asleep in the middle of the sentense.  Extremities: painful range of movements of the right leg. Pedal edema.  Skin: No rashes, lesions or ulcers Psychiatry: confused, unable to assess.     Data Reviewed: I have personally reviewed following labs and imaging studies  CBC: Recent Labs  Lab 05/19/21 1432 05/23/21 2240 05/25/21 0443  WBC 4.3 7.2 8.4  NEUTROABS 2.4 5.8  --   HGB 8.4* 9.8* 9.7*  HCT 25.5* 29.9* 29.3*  MCV 82.0 83.3 80.9  PLT 133* 142* 120*    Basic Metabolic Panel: Recent Labs  Lab 05/19/21 1432 05/23/21 2240 05/25/21 0443  NA 127* 125* 124*  K 3.4* 3.0* 3.5  CL 87* 83* 85*  CO2 _1 GLUCOSE 109* 164* 163*  BUN 28* 29* 36*  CREATININE 1.87* 1.88* 1.85*  CALCIUM 8.6* 9.0 8.7*  MG  --   --  1.8    GFR: Estimated Creatinine Clearance: 31.5 mL/min (A) (by C-G formula based on SCr of 1.85 mg/dL (H)).  Liver Function Tests: Recent Labs  Lab 05/19/21 1432  AST 24  ALT 10  ALKPHOS 127*  BILITOT 1.5*  PROT 5.5*  ALBUMIN 2.0*    CBG: Recent Labs  Lab 05/25/21 0021 05/25/21 0728 05/25/21 1116 05/25/21 1244 05/25/21 1613  GLUCAP 159* 170* 175* 177* 171*     Recent Results (from the past 240 hour(s))  Resp Panel by RT-PCR (Flu A&B, Covid) Nasopharyngeal Swab     Status: None   Collection Time: 05/23/21 10:50 PM   Specimen: Nasopharyngeal Swab; Nasopharyngeal(NP) swabs in vial transport medium  Result Value Ref Range Status   SARS Coronavirus 2 by RT PCR NEGATIVE NEGATIVE Final    Comment: (NOTE) SARS-CoV-2 target nucleic acids are NOT DETECTED.  The SARS-CoV-2 RNA is generally  detectable in upper respiratory specimens during the acute phase of infection. The lowest concentration of SARS-CoV-2 viral copies this assay can detect is 138 copies/mL. A negative result does not preclude SARS-Cov-2 infection and should not be used as the sole basis for treatment or other patient management decisions. A negative result may occur with  improper specimen collection/handling, submission of specimen other than nasopharyngeal swab, presence of viral mutation(s) within the areas targeted by this assay, and inadequate number of viral copies(<138 copies/mL). A negative result must be combined with clinical observations, patient history, and epidemiological information. The expected result is Negative.  Fact Sheet for Patients:  EntrepreneurPulse.com.au  Fact Sheet for Healthcare Providers:  IncredibleEmployment.be  This test is no t yet approved or cleared by the [place] and  has been authorized for detection and/or diagnosis of SARS-CoV-2 by FDA under an Emergency Use Authorization (EUA). This EUA will remain  in effect (meaning this test can be used) for the duration of the COVID-19 declaration under Section 564(b)(1) of the Act, 21 U.S.C.section 360bbb-3(b)(1), unless the authorization is terminated  or revoked sooner.       Influenza A by PCR NEGATIVE NEGATIVE Final   Influenza B by PCR NEGATIVE NEGATIVE Final    Comment: (NOTE) The Xpert Xpress SARS-CoV-2/FLU/RSV plus assay is intended as an aid in the diagnosis of influenza from Nasopharyngeal swab specimens and should not be used as a sole basis for treatment. Nasal washings and aspirates are unacceptable for Xpert Xpress SARS-CoV-2/FLU/RSV testing.  Fact Sheet for Patients: EntrepreneurPulse.com.au  Fact Sheet for Healthcare Providers: IncredibleEmployment.be  This test is not yet approved or cleared by the [place] FDA  and has been authorized for detection and/or diagnosis of SARS-CoV-2 by FDA under an Emergency Use Authorization (EUA). This EUA will remain in effect (meaning this test can be used) for the duration of the COVID-19 declaration under Section 564(b)(1) of the Act, 21 U.S.C. section 360bbb-3(b)(1), unless the authorization is terminated or revoked.  Performed at [facility], [address], [place], [place] [address]          Radiology Studies: CT Knee Right Wo Contrast  Result Date: 05/23/2021 CLINICAL DATA:  Fall EXAM: CT OF THE RIGHT KNEE WITHOUT CONTRAST TECHNIQUE: Multidetector CT imaging of the right knee was performed according to the standard protocol. Multiplanar CT image reconstructions were also generated. COMPARISON:  None. FINDINGS: There is a nondisplaced fracture of the distal right femur with fracture lines extending to the supracondylar lateral femoral surface. Fracture line of the middle portion of the distal femoral shaft extends to intercondylar surface. Fracture lines best delineated on series 4 and series 101. Right suprapatellar hemarthrosis. IMPRESSION: 1. Nondisplaced fracture of the distal right femur with fracture lines extending to the supracondylar lateral femoral cortex and intercondylar surface. 2. Right suprapatellar hemarthrosis. Electronically Signed   By: Ulyses Jarred M.D.   On: 05/23/2021 21:23   DG Chest Portable 1 View  Result Date: 05/23/2021 CLINICAL DATA:  Fall with blunt chest trauma EXAM: PORTABLE CHEST 1 VIEW COMPARISON:  Radiograph 04/29/2021, CT 02/01/2021 FINDINGS: Chronically coarsened interstitial and bronchitic features. More bandlike areas of opacity in the lung bases likely corresponding to areas of scarring in regions of as well as more nodular opacity in the right mid to lower lung no new consolidative process. No pneumothorax or effusion. No acute traumatic findings of the chest wall. Port-A-Cath projects over the right chest  with tip in the mid SVC. Stable cardiomediastinal contours. IMPRESSION: No acute cardiopulmonary or traumatic findings. Ill-defined nodular opacities in the right lung base may reflect regions of masslike opacity seen on comparison CT and radiography. Difficult to fully assess interval change on portable radiographs. Electronically Signed   By: Lovena Le M.D.   On: 05/23/2021 19:16        Scheduled Meds:  (feeding supplement) PROSource Plus  30 mL Oral TID BM   acetaminophen  1,000 mg Oral TID   Chlorhexidine Gluconate Cloth  6 each Topical Daily   furosemide  80 mg Oral BID   insulin aspart  0-20 Units Subcutaneous Q4H   lactulose  20 g Oral TID   magnesium oxide  400 mg Oral BID   multivitamin with minerals  1 tablet Oral Daily   pantoprazole  40 mg Oral BID   potassium chloride  40 mEq Oral BID   rifaximin  550 mg Oral BID   spironolactone  100 mg Oral Daily   Continuous Infusions:   LOS: 2 days        Hosie Poisson, MD [facility]   To contact the attending provider between 7A-7P or the covering provider during after hours 7P-7A, please log into the web site [email or web address] and access using universal [place] password for that web site. If you do not have the password, please call the hospital operator.  05/25/2021, 5:38 PM

## 2021-05-25 NOTE — Consult Note (Signed)
ORTHOPAEDIC CONSULTATION  REQUESTING PHYSICIAN: Hosie Poisson, MD  Chief Complaint: Right knee pain  HPI: Crystal [name] is a 70 y.o. female who complains of right knee pain.  X-rays in the ER initially showed no evidence of fracture, however CT showed presence of a nondisplaced intra-articular distal femur fracture.  Patient was admitted and orthopedics was consulted.  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  She lives at home with a friend.  She ambulates with the use of a walker.  Past Medical History:  Diagnosis Date   Acute GI bleeding 02/09/2018   Acute upper gastrointestinal bleeding 08/03/2018   Anemia    TAKES IRON TAB   Arthritis    SHOULDER   B12 deficiency 02/18/2018   Bleeding    GI  3/19   Bronchitis    HX OF   CHF (congestive heart failure) (HCC)    Cirrhosis (HCC)    Colon cancer screening    Diabetes mellitus without complication ([place])    TYPE 2   Fatty (change of) liver, not elsewhere classified    Full dentures    UPPER AND LOWER   Iron deficiency anemia due to chronic blood loss 02/18/2018   Lung nodule, multiple    Pernicious anemia 02/22/2018   PUD (peptic ulcer disease)    Raynaud's syndrome    Upper GI bleeding 08/03/2018   Past Surgical History:  Procedure Laterality Date   CATARACT EXTRACTION W/PHACO Right 02/06/2018   Procedure: CATARACT EXTRACTION PHACO AND INTRAOCULAR LENS PLACEMENT ([place]) RIGHT DIABETIC;  Surgeon: Leandrew Koyanagi, MD;  Location: [place];  Service: Ophthalmology;  Laterality: Right;  DIABETIC, ORAL MED   CATARACT EXTRACTION W/PHACO Left 04/30/2018   Procedure: CATARACT EXTRACTION PHACO AND INTRAOCULAR LENS PLACEMENT ([facility]);  Surgeon: Leandrew Koyanagi, MD;  Location: [facility];  Service: Ophthalmology;  Laterality: Left;  [place] 01:04 AP% 21.3 CDE 13.66 Fluid pack lot # [id number]    COLONOSCOPY WITH PROPOFOL N/A 03/25/2018   Procedure: COLONOSCOPY WITH PROPOFOL;  Surgeon: Lin Landsman, MD;  Location: [facility] ENDOSCOPY;  Service: Gastroenterology;  Laterality: N/A;   DENTAL SURGERY     EXTRACTIONS   ELECTROMAGNETIC NAVIGATION BROCHOSCOPY Right 01/27/2019   Procedure: ELECTROMAGNETIC NAVIGATION BRONCHOSCOPY;  Surgeon: [name], MD;  Location: [facility];  Service: Cardiopulmonary;  Laterality: Right;   ENTEROSCOPY N/A 07/28/2020   Procedure: Push ENTEROSCOPY;  Surgeon: Lin Landsman, MD;  Location: [facility]COPY;  Service: Gastroenterology;  Laterality: N/A;   ESOPHAGOGASTRODUODENOSCOPY N/A 08/03/2018   Procedure: ESOPHAGOGASTRODUODENOSCOPY (EGD);  Surgeon: Lin Landsman, MD;  Location: [facility] ENDOSCOPY;  Service: Gastroenterology;  Laterality: N/A;   ESOPHAGOGASTRODUODENOSCOPY (EGD) WITH PROPOFOL N/A 02/10/2018   Procedure: ESOPHAGOGASTRODUODENOSCOPY (EGD) WITH PROPOFOL;  Surgeon: Jonathon Bellows, MD;  Location: [facility] ENDOSCOPY;  Service: Gastroenterology;  Laterality: N/A;   ESOPHAGOGASTRODUODENOSCOPY (EGD) WITH PROPOFOL N/A 03/25/2018   Procedure: ESOPHAGOGASTRODUODENOSCOPY (EGD) WITH PROPOFOL;  Surgeon: Lin Landsman, MD;  Location: [facility] ENDOSCOPY;  Service: Gastroenterology;  Laterality: N/A;   ESOPHAGOGASTRODUODENOSCOPY (EGD) WITH PROPOFOL N/A 04/22/2018   Procedure: ESOPHAGOGASTRODUODENOSCOPY (EGD) WITH PROPOFOL with band ligation;  Surgeon: Lin Landsman, MD;  Location: [place];  Service: Gastroenterology;  Laterality: N/A;   ESOPHAGOGASTRODUODENOSCOPY (EGD) WITH PROPOFOL N/A 06/24/2018   Procedure: ESOPHAGOGASTRODUODENOSCOPY (EGD) WITH PROPOFOL;  Surgeon: Lin Landsman, MD;  Location: [facility] ENDOSCOPY;  Service: Gastroenterology;  Laterality: N/A;   ESOPHAGOGASTRODUODENOSCOPY (EGD) WITH  PROPOFOL N/A 11/19/2018   Procedure: ESOPHAGOGASTRODUODENOSCOPY (EGD) WITH PROPOFOL;  Surgeon: Lin Landsman, MD;  Location: [place];  Service:  Gastroenterology;  Laterality: N/A;   ESOPHAGOGASTRODUODENOSCOPY (EGD) WITH PROPOFOL N/A 03/08/2020   Procedure: ESOPHAGOGASTRODUODENOSCOPY (EGD) WITH PROPOFOL;  Surgeon: Lin Landsman, MD;  Location: [facility]COPY;  Service: Gastroenterology;  Laterality: N/A;   ESOPHAGOGASTRODUODENOSCOPY (EGD) WITH PROPOFOL N/A 04/28/2021   Procedure: ESOPHAGOGASTRODUODENOSCOPY (EGD) WITH PROPOFOL;  Surgeon: Lin Landsman, MD;  Location: [facility] ENDOSCOPY;  Service: Gastroenterology;  Laterality: N/A;   EYE SURGERY     GIVENS CAPSULE STUDY N/A 03/29/2020   Procedure: GIVENS CAPSULE STUDY;  Surgeon: Lin Landsman, MD;  Location: [facility] ENDOSCOPY;  Service: Gastroenterology;  Laterality: N/A;   HEMORRHOID BANDING  03/25/2018   Procedure: HEMORRHOID BANDING;  Surgeon: Lin Landsman, MD;  Location: [facility] ENDOSCOPY;  Service: Gastroenterology;;   IR EMBO ART  VEN HEMORR LYMPH EXTRAV  INC GUIDE ROADMAPPING  08/07/2018   IR RADIOLOGIST EVAL & MGMT  09/03/2018   IR RADIOLOGIST EVAL & MGMT  12/17/2018   IR RADIOLOGIST EVAL & MGMT  06/24/2019   IR RADIOLOGIST EVAL & MGMT  01/22/2020   IR RADIOLOGIST EVAL & MGMT  02/09/2021   IR TIPS  08/07/2018   IR TRANSHEPATIC PORTOGRAM W HEMO  03/07/2021   IR US GUIDE VASC ACCESS RIGHT  03/07/2021   IR VENO/JUGULAR LEFT  03/07/2021   IVC FILTER INSERTION N/A 04/28/2021   Procedure: IVC FILTER INSERTION;  Surgeon: Algernon Huxley, MD;  Location: [place] CV LAB;  Service: Cardiovascular;  Laterality: N/A;   PORTA CATH INSERTION N/A 07/30/2020   Procedure: PORTA CATH INSERTION;  Surgeon: Katha Cabal, MD;  Location: [place] [facility];  Service: Cardiovascular;  Laterality: N/A;   RADIOLOGY WITH ANESTHESIA N/A 08/07/2018   Procedure: TIPS;  Surgeon: Corrie Mckusick, DO;  Location: [place];  Service: Anesthesiology;  Laterality: N/A;   RIGHT HEART CATH N/A 05/20/2021   Procedure: RIGHT HEART CATH;  Surgeon: Nelva Bush, MD;  Location: [place] CV LAB;  Service:  Cardiovascular;  Laterality: N/A;   TONSILLECTOMY     Social History   Socioeconomic History   Marital status: Single    Spouse name: Not on file   Number of children: Not on file   Years of education: Not on file   Highest education level: Not on file  Occupational History   Not on file  Tobacco Use   Smoking status: Never   Smokeless tobacco: Never  Vaping Use   Vaping Use: Never used  Substance and Sexual Activity   Alcohol use: Not Currently    Comment: No EtOH for 30 years.  Never a heavy drinker.   Drug use: Never   Sexual activity: Not on file  Other Topics Concern   Not on file  Social History Narrative   Independent at baseline. Lives at home with family   Social Determinants of Health   Financial Resource Strain: Not on file  Food Insecurity: No Food Insecurity   Worried About Charity fundraiser in the Last Year: Never true   Arboriculturist in the Last Year: Never true  Transportation Needs: No Transportation Needs   Lack of Transportation (Medical): No   Lack of Transportation (Non-Medical): No  Physical Activity: Not on file  Stress: Not on file  Social Connections: Not on file   Family History  Problem Relation Age of Onset   CAD Father  Heart attack Father 34   Cancer Maternal Uncle    Seizures Mother    No Known Allergies Prior to Admission medications   Medication Sig Start Date End Date Taking? Authorizing Provider  aspirin-sod bicarb-citric acid (ALKA-SELTZER) 325 MG TBEF tablet Take 325 mg by mouth every 6 (six) hours as needed (indigestion).    [provider]  benzonatate (TESSALON) 100 MG capsule Take 1 capsule (100 mg total) by mouth 4 (four) times daily as needed. Patient taking differently: Take 100 mg by mouth 4 (four) times daily as needed for cough. 05/01/21   Sreenath, Sudheer B, MD  cyanocobalamin (,VITAMIN B-12,) 1000 MCG/ML injection Inject 1,000 mcg into the muscle every 30 (thirty) days.     [provider]   furosemide (LASIX) 80 MG tablet Take 1 tablet (80 mg total) by mouth 2 (two) times daily. 05/17/21   Loel Dubonnet, NP  gabapentin (NEURONTIN) 100 MG capsule Take 2 capsules (200 mg total) by mouth 2 (two) times daily for 10 days. 05/01/21 05/20/21  Sidney Ace, MD  hydrOXYzine (ATARAX/VISTARIL) 25 MG tablet Take 1 tablet (25 mg total) by mouth 2 (two) times daily as needed. Patient taking differently: Take 25 mg by mouth 2 (two) times daily as needed for itching. 04/08/21   Kathie Dike, MD  lactulose (CHRONULAC) 10 GM/15ML solution Take 30 mLs (20 g total) by mouth 3 (three) times daily. Patient taking differently: Take 20 g by mouth 2 (two) times daily. 04/08/21   Kathie Dike, MD  metolazone (ZAROXOLYN) 5 MG tablet Take 1 tablet (5 mg total) by mouth every other day. 05/20/21 09/17/21  Nelva Bush, MD  Misc. Devices KIT Moderate compression hose 20-30 MM HG 10/14/19   Vanga, Tally Due, MD  ondansetron (ZOFRAN) 4 MG tablet Take 4 mg by mouth every 8 (eight) hours as needed for nausea or vomiting.    [provider]  OneTouch Delica Lancets 10C MISC 2 (two) times daily. 12/01/19   [provider]  [facility] ULTRA test strip 2 (two) times daily. 11/23/19   [provider]  pantoprazole (PROTONIX) 40 MG tablet Take 1 tablet (40 mg total) by mouth 2 (two) times daily. 05/01/21 05/31/21  Sidney Ace, MD  potassium chloride (KLOR-CON) 20 MEQ tablet Take 1 tablet (20 mEq total) by mouth 2 (two) times daily. 05/11/21 09/08/21  End, Harrell Gave, MD  sodium bicarbonate 650 MG tablet Take 1,300 mg by mouth 2 (two) times daily. 02/23/21   [provider]  spironolactone (ALDACTONE) 100 MG tablet TAKE 1 TABLET BY MOUTH EVERY DAY 05/24/21   Vanga, Tally Due, MD  Tetrahydrozoline HCl (VISINE OP) Place 1 drop into both eyes daily as needed (irritaiton).    [provider]  Vitamin D, Ergocalciferol, (DRISDOL) 1.25 MG (50000 UNIT) CAPS capsule Take  50,000 Units by mouth every Sunday. 01/14/21   [provider]  XIFAXAN 550 MG TABS tablet Take 550 mg by mouth 2 (two) times daily. 06/24/20   [provider]   CT Head Wo Contrast  Result Date: 05/23/2021 CLINICAL DATA:  Pain after fall EXAM: CT HEAD WITHOUT CONTRAST CT CERVICAL SPINE WITHOUT CONTRAST TECHNIQUE: Multidetector CT imaging of the head and cervical spine was performed following the standard protocol without intravenous contrast. Multiplanar CT image reconstructions of the cervical spine were also generated. COMPARISON:  Apr 04, 2021 FINDINGS: CT HEAD FINDINGS Brain: Similar mild age related global parenchymal volume loss. Stable mild burden of chronic ischemic white matter microvascular  disease. No evidence of acute large vascular territory infarction, hemorrhage, hydrocephalus, extra-axial collection or mass lesion/mass effect. Vascular: No hyperdense vessel. Atherosclerotic calcifications of the internal carotid and vertebral arteries at the skull base. Skull: Normal. Negative for fracture or focal lesion. Sinuses/Orbits: Visualized portions of the paranasal sinuses and mastoid air cells are predominantly clear. Orbits are grossly unremarkable. Other: None CT CERVICAL SPINE FINDINGS Alignment: Mild loss of the normal cervical lordosis. No evidence of traumatic listhesis. 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 No acute fracture. Soft tissues and spinal canal: No prevertebral fluid or swelling. No visible canal hematoma. Disc levels: Moderate multilevel discogenic disease most notable in the lower cervical spine. Upper chest: No acute abnormality. Other: Calcifications in the bilateral parotid glands. Carotid artery calcifications. Partially visualized right chest wall Port-A-Cath. IMPRESSION: 1. No evidence of acute intracranial process. 2. No evidence of acute fracture  or traumatic listhesis of the cervical spine. 3. 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 4. 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 Electronically Signed   By: Dahlia Bailiff MD   On: 05/23/2021 17:53   CT Cervical Spine Wo Contrast  Result Date: 05/23/2021 CLINICAL DATA:  Pain after fall EXAM: CT HEAD WITHOUT CONTRAST CT CERVICAL SPINE WITHOUT CONTRAST TECHNIQUE: Multidetector CT imaging of the head and cervical spine was performed following the standard protocol without intravenous contrast. Multiplanar CT image reconstructions of the cervical spine were also generated. COMPARISON:  Apr 04, 2021 FINDINGS: CT HEAD FINDINGS Brain: Similar mild age related global parenchymal volume loss. Stable mild burden of chronic ischemic white matter microvascular disease. No evidence of acute large vascular territory infarction, hemorrhage, hydrocephalus, extra-axial collection or mass lesion/mass effect. Vascular: No hyperdense vessel. Atherosclerotic calcifications of the internal carotid and vertebral arteries at the skull base. Skull: Normal. Negative for fracture or focal lesion. Sinuses/Orbits: Visualized portions of the paranasal sinuses and mastoid air cells are predominantly clear. Orbits are grossly unremarkable. Other: None CT CERVICAL SPINE FINDINGS Alignment: Mild loss of the normal cervical lordosis. No evidence of traumatic listhesis. 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 No acute fracture. Soft tissues and spinal canal: No prevertebral fluid or swelling. No visible canal hematoma. Disc levels: Moderate multilevel discogenic disease most notable in the lower cervical spine. Upper chest: No acute abnormality. Other: Calcifications in the bilateral parotid glands. Carotid artery calcifications.  Partially visualized right chest wall Port-A-Cath. IMPRESSION: 1. No evidence of acute intracranial process. 2. No evidence of acute fracture or traumatic listhesis of the cervical spine. 3. 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 4. 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 Electronically Signed   By: Dahlia Bailiff MD   On: 05/23/2021 17:53   CT Knee Right Wo Contrast  Result Date: 05/23/2021 CLINICAL DATA:  Fall EXAM: CT OF THE RIGHT KNEE WITHOUT CONTRAST TECHNIQUE: Multidetector CT imaging of the right knee was performed according to the standard protocol. Multiplanar CT image reconstructions were also generated. COMPARISON:  None. FINDINGS: There is a nondisplaced fracture of the distal right femur with fracture lines extending to the supracondylar lateral femoral surface. Fracture line of the middle portion of the distal femoral shaft extends to  intercondylar surface. Fracture lines best delineated on series 4 and series 101. Right suprapatellar hemarthrosis. IMPRESSION: 1. Nondisplaced fracture of the distal right femur with fracture lines extending to the supracondylar lateral femoral cortex and intercondylar surface. 2. Right suprapatellar hemarthrosis. Electronically Signed   By: Ulyses Jarred M.D.   On: 05/23/2021 21:23   DG Chest Portable 1 View  Result Date: 05/23/2021 CLINICAL DATA:  Fall with blunt chest trauma EXAM: PORTABLE CHEST 1 VIEW COMPARISON:  Radiograph 04/29/2021, CT 02/01/2021 FINDINGS: Chronically coarsened interstitial and bronchitic features. More bandlike areas of opacity in the lung bases likely corresponding to areas of scarring in regions of as well as more nodular opacity in the right mid to lower lung no new consolidative process. No pneumothorax or effusion. No acute traumatic findings of the chest wall. Port-A-Cath projects over the right chest with tip in the mid SVC.  Stable cardiomediastinal contours. IMPRESSION: No acute cardiopulmonary or traumatic findings. Ill-defined nodular opacities in the right lung base may reflect regions of masslike opacity seen on comparison CT and radiography. Difficult to fully assess interval change on portable radiographs. Electronically Signed   By: Lovena Le M.D.   On: 05/23/2021 19:16   DG Knee Complete 4 Views Right  Result Date: 05/23/2021 CLINICAL DATA:  Pain after fall EXAM: RIGHT KNEE - COMPLETE 4+ VIEW COMPARISON:  None. FINDINGS: No evidence of fracture or dislocation. Moderate suprapatellar joint effusion with overlying soft tissue swelling. Patellar enthesophyte. Mild tricompartment degenerative change. Vascular calcifications. IMPRESSION: 1. No acute osseous abnormality. 2. Moderate suprapatellar joint effusion with overlying soft tissue swelling 3. Mild tricompartment degenerative change. Electronically Signed   By: Dahlia Bailiff MD   On: 05/23/2021 17:56    Positive ROS: All other systems have been reviewed and were otherwise negative with the exception of those mentioned in the HPI and as above.  Physical Exam: General: Alert, no acute distress Cardiovascular: No pedal edema Respiratory: No cyanosis, no use of accessory musculature GI: No organomegaly, abdomen is soft and non-tender Skin: No lesions in the area of chief complaint Neurologic: Sensation intact distally Psychiatric: Patient is competent for consent with normal mood and affect Lymphatic: No axillary or cervical lymphadenopathy  MUSCULOSKELETAL: Right lower extremity in a knee immobilizer.  Moderate knee effusion, diffuse tenderness to palpation, able to dorsiflex/plantarflex ankle and toes, right lower extremity is neurovascular intact  Assessment: 70 year old female with multiple medical comorbidities admitted with a nondisplaced right distal femur fracture  Plan: We had a long discussion regarding risks and benefits of operative and  nonoperative treatment options.  Given that the patient's fracture is nondisplaced and she has significant perioperative risks, we will plan for nonoperative management.  Recommend nonweightbearing on the right lower extremity.  After 1 week, okay to transition to a hinged knee brace for gentle knee range of motion.  Will continue to follow.    Renee Harder, MD    05/25/2021 7:55 AM

## 2021-05-25 NOTE — TOC Progression Note (Signed)
Transition of Care [facility]) - Progression Note    Patient Details  Name: Crystal [name] MRN: [id number] Date of Birth: [birth year]  Transition of [name]) CM/SW [place], RN Phone Number: 05/25/2021, 9:13 AM  Clinical Narrative:     Spoke with the member and reviewed the bed offers as well as Medicare Star rating, she stated that she does not want to go that far away, I reached out to the local facilities and requested them to review the patient for a potential bed offer, awaiting offers.  Expected Discharge Plan: Skilled Nursing Facility Barriers to Discharge: Continued Medical Work up  Expected Discharge Plan and Services Expected Discharge Plan: [place]   Discharge Planning Services: CM Consult                                           Social Determinants of Health (SDOH) Interventions    Readmission Risk Interventions Readmission Risk Prevention Plan 04/28/2021 04/07/2021  Transportation Screening Complete Complete  Medication Review Press photographer) Complete Complete  PCP or Specialist appointment within 3-5 days of discharge - Complete  HRI or [place] Complete Complete  SW Recovery Care/Counseling Consult - Complete  Palliative Care Screening Not Applicable Not [place] Not Applicable Not Applicable  Some recent data might be hidden

## 2021-05-26 DIAGNOSIS — W19XXXA Unspecified fall, initial encounter: Secondary | ICD-10-CM

## 2021-05-26 DIAGNOSIS — S72464A Nondisplaced supracondylar fracture with intracondylar extension of lower end of right femur, initial encounter for closed fracture: Secondary | ICD-10-CM

## 2021-05-26 DIAGNOSIS — Z515 Encounter for palliative care: Secondary | ICD-10-CM

## 2021-05-26 DIAGNOSIS — Z7189 Other specified counseling: Secondary | ICD-10-CM

## 2021-05-26 LAB — GLUCOSE, CAPILLARY
Glucose-Capillary: 129 mg/dL — ABNORMAL HIGH (ref 70–99)
Glucose-Capillary: 130 mg/dL — ABNORMAL HIGH (ref 70–99)
Glucose-Capillary: 147 mg/dL — ABNORMAL HIGH (ref 70–99)
Glucose-Capillary: 148 mg/dL — ABNORMAL HIGH (ref 70–99)
Glucose-Capillary: 163 mg/dL — ABNORMAL HIGH (ref 70–99)
Glucose-Capillary: 165 mg/dL — ABNORMAL HIGH (ref 70–99)
Glucose-Capillary: 171 mg/dL — ABNORMAL HIGH (ref 70–99)
Glucose-Capillary: 172 mg/dL — ABNORMAL HIGH (ref 70–99)

## 2021-05-26 LAB — COMPREHENSIVE METABOLIC PANEL
ALT: 11 U/L (ref 0–44)
AST: 25 U/L (ref 15–41)
Albumin: 2 g/dL — ABNORMAL LOW (ref 3.5–5.0)
Alkaline Phosphatase: 134 U/L — ABNORMAL HIGH (ref 38–126)
Anion gap: 9 (ref 5–15)
BUN: 44 mg/dL — ABNORMAL HIGH (ref 8–23)
CO2: 29 mmol/L (ref 22–32)
Calcium: 8.6 mg/dL — ABNORMAL LOW (ref 8.9–10.3)
Chloride: 90 mmol/L — ABNORMAL LOW (ref 98–111)
Creatinine, Ser: 2.17 mg/dL — ABNORMAL HIGH (ref 0.44–1.00)
GFR, Estimated: 24 mL/min — ABNORMAL LOW (ref >60)
Glucose, Bld: 137 mg/dL — ABNORMAL HIGH (ref 70–99)
Potassium: 3.9 mmol/L (ref 3.5–5.1)
Sodium: 128 mmol/L — ABNORMAL LOW (ref 135–145)
Total Bilirubin: 1.9 mg/dL — ABNORMAL HIGH (ref 0.3–1.2)
Total Protein: 5.7 g/dL — ABNORMAL LOW (ref 6.5–8.1)

## 2021-05-26 LAB — OSMOLALITY: Osmolality: 277 mOsm/kg (ref 275–295)

## 2021-05-26 LAB — AMMONIA: Ammonia: 76 umol/L — ABNORMAL HIGH (ref 9–35)

## 2021-05-26 MED ORDER — FUROSEMIDE 40 MG PO TABS
80.0000 mg | ORAL_TABLET | Freq: Two times a day (BID) | ORAL | Status: DC
  Administered 2021-05-27 – 2021-05-28 (×3): 80 mg via ORAL
  Filled 2021-05-26 (×3): qty 2

## 2021-05-26 MED ORDER — LACTULOSE 10 GM/15ML PO SOLN
30.0000 g | Freq: Three times a day (TID) | ORAL | Status: DC
  Administered 2021-05-26 – 2021-06-03 (×23): 30 g via ORAL
  Filled 2021-05-26 (×23): qty 60

## 2021-05-26 MED ORDER — LACTULOSE ENEMA
300.0000 mL | Freq: Once | ORAL | Status: AC
  Administered 2021-05-26: 300 mL via RECTAL
  Filled 2021-05-26: qty 300

## 2021-05-26 MED ORDER — OXYCODONE HCL 5 MG PO TABS
5.0000 mg | ORAL_TABLET | Freq: Three times a day (TID) | ORAL | Status: DC | PRN
  Administered 2021-05-27: 5 mg via ORAL
  Filled 2021-05-26: qty 1

## 2021-05-26 NOTE — Progress Notes (Signed)
Physical Therapy Treatment Patient Details Name: Crystal [name] MRN: [id number] DOB: 18-Jun-1951 Today's Date: 05/26/2021    History of Present Illness Pt is a 70 y.o. 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 CT knee showed nondisplaced fracture distal right femur with suprapatellar hemarthrosis.  Per orthopedic consult, pt to be NWB on RLE due to conservative management of fracture.    PT Comments    Patient still lethargic, a bit more interactive this session but predominantly with eyes closed, constant multimodal cues to attend to task/exercises. Limited by pt fatigue and pain levels, cognitive status.The patient was able to perform a few UE and LLE exercises with AAROM/PROM, bed linens noted to be soaked in urine. Rolling L and R twice with maxAx2, pt cued and able to hold onto bed rails and initiate returning to supine. Significant pain signs/symptoms with all mobility. Pt in bed with HOB elevated, all needs in reach at end of session. The patient would benefit from further skilled PT intervention to progress towards goals as able . Recommendation remains appropriate.       Follow Up Recommendations  SNF     Equipment Recommendations  Other (comment) (TBD at next venue of care)    Recommendations for Other Services       Precautions / Restrictions Precautions Precautions: Fall Required Braces or Orthoses: Knee Immobilizer - Right Restrictions Weight Bearing Restrictions: Yes RLE Weight Bearing: Non weight bearing    Mobility  Bed Mobility   Bed Mobility: Rolling Rolling: Max assist;+2 for physical assistance         General bed mobility comments: pt cued to reach for bed rails, able to initiate rolling onto her back    Transfers                 General transfer  comment: deferred  Ambulation/Gait                 Stairs             Wheelchair Mobility    Modified Rankin (Stroke Patients Only)       Balance                                            Cognition Arousal/Alertness: Lethargic Behavior During Therapy: Flat affect Overall Cognitive Status: No family/caregiver present to determine baseline cognitive functioning                                 General Comments: pt sleepy/lethargic throughout sesssion. Able to follow one step commands inconsistently, with extended time and repetition      Exercises Other Exercises Other Exercises: BUE shoulder flexion and elbow flexion/extension x5, cued for AROM as much as possible. a bit more interactive than last session. Other Exercises: x5 L knee heel slides, AAROM. pt unable to DF/PF on command for LLE    General Comments        Pertinent Vitals/Pain Pain Score: 8  Pain Location: R knee Pain Descriptors / Indicators: Sore;Grimacing;Moaning;Guarding Pain Intervention(s): Limited activity within patient's tolerance;Monitored during session;Repositioned;Patient requesting pain meds-RN notified    Home Living  Prior Function            PT Goals (current goals can now be found in the care plan section) Progress towards PT goals: Progressing toward goals    Frequency    7X/week      PT Plan Current plan remains appropriate    Co-evaluation              AM-PAC PT "6 Clicks" Mobility   Outcome Measure  Help needed turning from your back to your side while in a flat bed without using bedrails?: Total Help needed moving from lying on your back to sitting on the side of a flat bed without using bedrails?: Total Help needed moving to and from a bed to a chair (including a wheelchair)?: Total Help needed standing up from a chair using your arms (e.g., wheelchair or bedside chair)?: Total Help  needed to walk in hospital room?: Total Help needed climbing 3-5 steps with a railing? : Total 6 Click Score: 6    End of Session   Activity Tolerance: Patient limited by lethargy;Patient limited by pain Patient left: in bed;with call bell/phone within reach;with bed alarm set;with family/visitor present Nurse Communication: Mobility status;Patient requests pain meds PT Visit Diagnosis: Unsteadiness on feet (R26.81);Other abnormalities of gait and mobility (R26.89);Pain;Difficulty in walking, not elsewhere classified (R26.2);Muscle weakness (generalized) (M62.81) Pain - Right/Left: Right Pain - part of body: Knee;Leg     Time: 9629-5284 PT Time Calculation (min) (ACUTE ONLY): 35 min  Charges:  $Therapeutic Exercise: 8-22 mins $Therapeutic Activity: 8-22 mins                     Lieutenant Diego PT, DPT 11:34 AM,05/26/21

## 2021-05-26 NOTE — TOC Progression Note (Addendum)
Transition of Care [facility]) - Progression Note    Patient Details  Name: Crystal [name] MRN: [id number] Date of Birth: 1950/12/23  Transition [facility] CM/SW [place], RN Phone Number: 05/26/2021, 3:53 PM  Clinical Narrative:      Met with the patient and reviewed the Bed offers and medicare star rating, she chose the bed offer from [facility], I notified [facility] , once the patient is medically stable will request [facility] to start the ins process to get approval   Expected Discharge Plan: [place] Barriers to Discharge: Continued Medical Work up  Expected Discharge Plan and Services Expected Discharge Plan: [place]   Discharge Planning Services: CM Consult                                           Social Determinants of Health (SDOH) Interventions    Readmission Risk Interventions Readmission Risk Prevention Plan 04/28/2021 04/07/2021  Transportation Screening Complete Complete  Medication Review Press photographer) Complete Complete  PCP or Specialist appointment within 3-5 days of discharge - Complete  HRI or Home Care Consult Complete Complete  SW Recovery Care/Counseling Consult - Complete  Palliative Care Screening Not Applicable Not [place] Not Applicable Not Applicable  Some recent data might be hidden

## 2021-05-26 NOTE — Progress Notes (Signed)
PROGRESS NOTE    Crystal [name]  MAU:[id number] DOB: 10-Jan-1951 DOA: 05/23/2021 PCP: Ileene Hutchinson, PA    Chief Complaint  Patient presents with   Fall    Brief Narrative:  Crystal [name] is a 70 y.o. 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   Patient usually ambulates with a walker, but was using her cane and tripped over her shoe.  No loss of consciousness, no preceding chest pain, palpitations, dizziness.     In the ER, CT showed nondisplaced femur fracture, distal right femur with suprapatellar hemarthrosis.  Orthopedics were consulted and the hospitalist service were asked to evaluate. Pt seen and examined at bedside. She is more alert today compared to yesterday, able to answer simple questions.   Assessment & Plan:   Principal Problem:   Fracture of femur, distal, right, closed ([place]) Active Problems:   S/P TIPS (transjugular intrahepatic portosystemic shunt)   Coronary artery disease involving native coronary artery of native heart without angina pectoris   Liver cirrhosis secondary to NASH (HCC)   Chronic diastolic CHF (congestive heart failure) (HCC)   Diabetes mellitus, type 2 (HCC)   Chronic kidney disease, stage IV (severe) (HCC)   Closed fracture of distal end of right femur ([place])   Accidental fall   Mechanical fall leading to distal femur fracture on the right -Orthopedics consulted recommended nonsurgical management at this time. Therapy evaluations recommending SNF.  TOC on board, palliative care consulted for goals of care.    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  One dose of lactulose enema ordered today.  Rifaximin added to her regimen Continue  with lasix and spironolactone.    Stage IV CKD Slight worsening of creatinine from 1.8 to 2.  Hold evening dose of lasix. Repeat BMP in am.     Chronic diastolic heart failure Appears compensated at this time Patient currently denies any shortness of breath or chest pain but lower extremity edema present. Resume lasix and spironolactone .    Hypokalemia replaced  Chronic hyponatremia.  Suspect a combination of cirrhosis and diuretic use. Elevated TSH, get free t4, levels.  serum osmolarity and urine osmolarity. Sodium has improved to 128.  Continue to monitor.    Anemia of chronic disease No bleeding evident Transfuse to keep hemoglobin greater than 7.    Thrombocytopenia Probably secondary to cirrhosis Continue to monitor.   Diabetes mellitus, non insulin dependent.  CBG (last 3)  Recent Labs    05/26/21 0432 05/26/21 0744 05/26/21 1254  GLUCAP 130* 129* 172*    Continue with sliding scale insulin  Hemoglobin A1c is 5.6    DVT prophylaxis: SCD'S Code Status: fULL CODE Family Communication: (room mate at bedside.  Disposition:   Status is: Inpatient  Remains inpatient appropriate because:Ongoing diagnostic testing needed not appropriate for outpatient work up, Unsafe d/c plan, and IV treatments appropriate due to intensity of illness or inability to take PO  Dispo: The patient is from: Home              Anticipated d/c is to: SNF              Patient currently is not medically stable to d/c.   Difficult to place  patient No       Consultants:  Orthopedics   Procedures: none.   Antimicrobials: ( Antibiotics Given (last 72 hours)     Date/Time Action Medication Dose   05/25/21 2144 Given   rifaximin (XIFAXAN) tablet 550 mg 550 mg   05/26/21 0933 Given   rifaximin (XIFAXAN) tablet 550 mg 550 mg         Subjective: Still sleepy,but better than yesterday, no BM yet.   Objective: Vitals:   05/25/21 2000 05/26/21 0328 05/26/21 0742  05/26/21 1205  BP: (!) 114/51 120/62 (!) 109/57 (!) 117/59  Pulse: 75 79 78 78  Resp: _0 Temp: 98.9 F (37.2 C)  98.6 F (37 C) 98.5 F (36.9 C)  TempSrc:      SpO2: 91% 93% 94% 93%  Weight:      Height:        Intake/Output Summary (Last 24 hours) at 05/26/2021 1457 Last data filed at 05/26/2021 1356 Gross per 24 hour  Intake 240 ml  Output --  Net 240 ml    Filed Weights   05/23/21 1631  Weight: 98.8 kg    Examination:  General exam: elderly woman, lethargic, not in distress.  Respiratory system: air entry fair, no wheezing heard , no tachypnea. On RA.  Cardiovascular system: S1S2, RRR no JVD, pedal edema.  Gastrointestinal system: Abdomen is soft, non tender, non distended, bowel sounds nwnl.  Central nervous system: lethargic, but opens eyes to answer questions, able to eat meals.  Extremities: painful range of movements of the right leg. Pedal edema.  Skin: No rashes seen.  Psychiatry: confused, unable to assess.     Data Reviewed: I have personally reviewed following labs and imaging studies  CBC: Recent Labs  Lab 05/23/21 2240 05/25/21 0443  WBC 7.2 8.4  NEUTROABS 5.8  --   HGB 9.8* 9.7*  HCT 29.9* 29.3*  MCV 83.3 80.9  PLT 142* 120*     Basic Metabolic Panel: Recent Labs  Lab 05/23/21 2240 05/25/21 0443 05/26/21 0446  NA 125* 124* 128*  K 3.0* 3.5 3.9  CL 83* 85* 90*  CO2 _1 GLUCOSE 164* 163* 137*  BUN 29* 36* 44*  CREATININE 1.88* 1.85* 2.17*  CALCIUM 9.0 8.7* 8.6*  MG  --  1.8  --      GFR: Estimated Creatinine Clearance: 26.9 mL/min (A) (by C-G formula based on SCr of 2.17 mg/dL (H)).  Liver Function Tests: Recent Labs  Lab 05/26/21 0446  AST 25  ALT 11  ALKPHOS 134*  BILITOT 1.9*  PROT 5.7*  ALBUMIN 2.0*     CBG: Recent Labs  Lab 05/25/21 1950 05/26/21 0022 05/26/21 0432 05/26/21 0744 05/26/21 1254  GLUCAP 170* 147* 130* 129* 172*      Recent Results (from the past 240 hour(s))  Resp  Panel by RT-PCR (Flu A&B, Covid) Nasopharyngeal Swab     Status: None   Collection Time: 05/23/21 10:50 PM   Specimen: Nasopharyngeal Swab; Nasopharyngeal(NP) swabs in vial transport medium  Result Value Ref Range Status   SARS Coronavirus 2 by RT PCR NEGATIVE NEGATIVE Final    Comment: (NOTE) SARS-CoV-2 target nucleic acids are NOT DETECTED.  The SARS-CoV-2 RNA is generally detectable in upper respiratory specimens during the acute phase of infection. The lowest concentration of SARS-CoV-2 viral copies this assay can detect is 138 copies/mL. A negative result does not preclude SARS-Cov-2 infection and should not be used as the sole basis  for treatment or other patient management decisions. A negative result may occur with  improper specimen collection/handling, submission of specimen other than nasopharyngeal swab, presence of viral mutation(s) within the areas targeted by this assay, and inadequate number of viral copies(<138 copies/mL). A negative result must be combined with clinical observations, patient history, and epidemiological information. The expected result is Negative.  Fact Sheet for Patients:  EntrepreneurPulse.com.au  Fact Sheet for Healthcare Providers:  IncredibleEmployment.be  This test is no t yet approved or cleared by the [place] FDA and  has been authorized for detection and/or diagnosis of SARS-CoV-2 by FDA under an Emergency Use Authorization (EUA). This EUA will remain  in effect (meaning this test can be used) for the duration of the COVID-19 declaration under Section 564(b)(1) of the Act, 21 U.S.C.section 360bbb-3(b)(1), unless the authorization is terminated  or revoked sooner.       Influenza A by PCR NEGATIVE NEGATIVE Final   Influenza B by PCR NEGATIVE NEGATIVE Final    Comment: (NOTE) The Xpert Xpress SARS-CoV-2/FLU/RSV plus assay is intended as an aid in the diagnosis of influenza from Nasopharyngeal  swab specimens and should not be used as a sole basis for treatment. Nasal washings and aspirates are unacceptable for Xpert Xpress SARS-CoV-2/FLU/RSV testing.  Fact Sheet for Patients: EntrepreneurPulse.com.au  Fact Sheet for Healthcare Providers: IncredibleEmployment.be  This test is not yet approved or cleared by the [place] FDA and has been authorized for detection and/or diagnosis of SARS-CoV-2 by FDA under an Emergency Use Authorization (EUA). This EUA will remain in effect (meaning this test can be used) for the duration of the COVID-19 declaration under Section 564(b)(1) of the Act, 21 U.S.C. section 360bbb-3(b)(1), unless the authorization is terminated or revoked.  Performed at [facility], [address], [place], [place] [address]           Radiology Studies: No results found.      Scheduled Meds:  (feeding supplement) PROSource Plus  30 mL Oral TID BM   acetaminophen  1,000 mg Oral TID   Chlorhexidine Gluconate Cloth  6 each Topical Daily   [START ON 05/27/2021] furosemide  80 mg Oral BID   insulin aspart  0-20 Units Subcutaneous Q4H   lactulose  30 g Oral TID   lactulose  300 mL Rectal Once   magnesium oxide  400 mg Oral BID   multivitamin with minerals  1 tablet Oral Daily   pantoprazole  40 mg Oral BID   potassium chloride  40 mEq Oral BID   rifaximin  550 mg Oral BID   spironolactone  100 mg Oral Daily   Continuous Infusions:   LOS: 3 days        Hosie Poisson, MD [facility]   To contact the attending provider between 7A-7P or the covering provider during after hours 7P-7A, please log into the web site [email or web address] and access using universal [place] password for that web site. If you do not have the password, please call the hospital operator.  05/26/2021, 2:57 PM

## 2021-05-26 NOTE — TOC Progression Note (Signed)
Transition of Care [facility]) - Progression Note    Patient Details  Name: Crystal [name] MRN: [id number] Date of Birth: May 31, 1951  Transition of Care [facility]) CM/SW [place], RN Phone Number: 05/26/2021, 8:59 AM  Clinical Narrative:    Spoke with the patient and explained to her that there are no bed offers in [place], However we have multiple other offers in surrounding areas, She is going to work with PT and then afterwards agreed for me to come back and review the bed offers so she can make a choice.  I explained we will need to get insurance approval as well.    Expected Discharge Plan: Skilled Nursing Facility Barriers to Discharge: Continued Medical Work up  Expected Discharge Plan and Services Expected Discharge Plan: [place]   Discharge Planning Services: CM Consult                                           Social Determinants of Health (SDOH) Interventions    Readmission Risk Interventions Readmission Risk Prevention Plan 04/28/2021 04/07/2021  Transportation Screening Complete Complete  Medication Review Press photographer) Complete Complete  PCP or Specialist appointment within 3-5 days of discharge - Complete  HRI or [place] Complete Complete  SW Recovery Care/Counseling Consult - Complete  Palliative Care Screening Not Applicable Not [place] [place] Not Applicable Not Applicable  Some recent data might be hidden

## 2021-05-26 NOTE — Consult Note (Signed)
Palliative Care Consult Note                                  Date: 05/26/2021   Patient Name: Crystal [name]  DOB: 1951/02/09  MRN: [id number]  Age / Sex: 70 y.o., female  PCP: Ileene Hutchinson, [place] Referring Physician: Hosie Poisson, MD  Reason for Consultation: Establishing goals of care  HPI/Patient Profile: Palliative Care consult requested for goals of care discussion in this 70 y.o. female  with past medical history of  dCHF, CKD4, GI bleed, NASH cirrhosis s/p TIPS, and diabetes. Patient is also being followed by Oncology for right lower lobe slow growing lung nodule which is under observation. She was admitted on 05/23/2021 from home s/p fall with right knee pain. Per notations patient was ambulating with her cane and tripped over her shoe. No loss of consciousness. CT of knee showed nondisplaced right distal femur fracture. She is being followed by Orthopedics with recommendations for medical management (NWB on right lower extremity with plans to transition to a hinged brace after 1 week) given the significant risk related to surgery. PT recommending SNF rehabilitation.   Past Medical History:  Diagnosis Date   Acute GI bleeding 02/09/2018   Acute upper gastrointestinal bleeding 08/03/2018   Anemia    TAKES IRON TAB   Arthritis    SHOULDER   B12 deficiency 02/18/2018   Bleeding    GI  3/19   Bronchitis    HX OF   CHF (congestive heart failure) (HCC)    Cirrhosis (HCC)    Colon cancer screening    Diabetes mellitus without complication ([place])    TYPE 2   Fatty (change of) liver, not elsewhere classified    Full dentures    UPPER AND LOWER   Iron deficiency anemia due to chronic blood loss 02/18/2018   Lung nodule, multiple    Pernicious anemia 02/22/2018   PUD (peptic ulcer disease)    Raynaud's syndrome    Upper GI bleeding 08/03/2018     Subjective:   This NP Osborne Oman reviewed medical records, received report from team,  assessed the patient and then met at the patient's bedside with Crystal [name] and her boyfriend [name] to discuss diagnosis, prognosis, GOC, EOL wishes disposition and options.  Patient is awake, seems drowsy but able to answer questions appropriately. Crystal [name] is at the bedside assisting patient to eat, however she states she does not care for the roast beef. Denies pain or shortness of breath.   Patient provides permission to have discussion with Crystal [name] involved.    Concept of Palliative Care was introduced as specialized medical care for people and their families living with serious illness.  It focuses on providing relief from the symptoms and stress of a serious illness.  The goal is to improve quality of life for both the patient and the family. Values and goals of care important to patient and family were attempted to be elicited.  Created space and opportunity for patient and family to explore thoughts and feelings. Patient and Crystal [name] lives in the home together. They have known eachother for over 70 years and been in a relationship for over 20 years, living together since the early 31s.   Prior to admission patient was able to perform all ADLs. She reports driving prior to her fall and able to assist in house chores. Crystal [name] states they would share chores and  patient would generally cook dinner 2-3 days a week. She was ambulatory with a cane or walker for gait stability. Her appetite was good.   We discussed Her current illness and what it means in the larger context of Her on-going co-morbidities. Natural disease trajectory and expectations were discussed. Questions and concerns addressed.   Patient and Crystal [name] both verbalized understanding of her current illness and co-morbidities. Crystal [name] states she was not paying attention and tripped over her shoes that were in the floor when trying to go to the bathroom. She states she is not planning to have surgery knowing it comes with great risk and is hopeful  "things" will work itself out.   Questions and concerns were addressed. Patient was encouraged to call with questions or concerns.  PMT will continue to support holistically as needed.  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 She has 2 daughters. Crystal [name] (Significant Other) lives in the home with her and they have been in a relationship for over 20 years.    Objective:   Primary Diagnoses: Present on Admission:  Coronary artery disease involving native coronary artery of native heart without angina pectoris  Chronic diastolic CHF (congestive heart failure) (HCC)  Liver cirrhosis secondary to NASH (HCC)  Chronic kidney disease, stage IV (severe) (HCC)  Fracture of femur, distal, right, closed ([place])   Scheduled Meds:  (feeding supplement) PROSource Plus  30 mL Oral TID BM   acetaminophen  1,000 mg Oral TID   Chlorhexidine Gluconate Cloth  6 each Topical Daily   [START ON 05/27/2021] furosemide  80 mg Oral BID   insulin aspart  0-20 Units Subcutaneous Q4H   lactulose  30 g Oral TID   lactulose  300 mL Rectal Once   magnesium oxide  400 mg Oral BID   multivitamin with minerals  1 tablet Oral Daily   pantoprazole  40 mg Oral BID   potassium chloride  40 mEq Oral BID   rifaximin  550 mg Oral BID   spironolactone  100 mg Oral Daily    Continuous Infusions:   PRN Meds: albuterol, benzonatate, guaiFENesin-dextromethorphan, hydrOXYzine, ondansetron **OR** ondansetron (ZOFRAN) IV, oxyCODONE  No Known Allergies  Review of Systems  Musculoskeletal:  Positive for arthralgias.  Neurological:  Positive for weakness.  Unless otherwise noted, a complete review of systems is negative.  Physical Exam General: NAD Cardiovascular: regular rate and rhythm Pulmonary:  diminished bilaterally  Abdomen: soft, nontender, obese, + bowel sounds Extremities: lower extremity edema, no joint deformities Skin: no rashes, warm and dry Neurological: somnolent but easily aroused. Does  intermittently fall asleep during discussion but will wake back up and pick up where she left off.   Vital Signs:  BP (!) 117/59 (BP Location: Left Arm)   Pulse 78   Temp 98.5 F (36.9 C)   Resp 18   Ht 5' 2" (1.575 m)   Wt 98.8 kg   LMP  (LMP Unknown)   SpO2 93%   BMI 39.84 kg/m  Pain Scale: 0-10 POSS *See Group Information*: S-Acceptable,Sleep, easy to arouse Pain Score: 10-Worst pain ever  SpO2: SpO2: 93 % O2 Device:SpO2: 93 % O2 Flow Rate: .   IO: Intake/output summary:  Intake/Output Summary (Last 24 hours) at 05/26/2021 1409 Last data filed at 05/26/2021 1356 Gross per 24 hour  Intake 240 ml  Output --  Net 240 ml    LBM: Last BM Date: 05/23/21 Baseline Weight: Weight: 98.8 kg Most recent weight: Weight: 98.8 kg  Palliative Assessment/Data: PPS 30%    Advanced Care Planning:   Primary Decision Maker: NEXT OF Rozanna Boer   Code Status/Advance Care Planning: Full code  A discussion was had today regarding advanced directives. Concepts specific to code status, artifical feeding and hydration, continued IV antibiotics and rehospitalization was had.    Patient states she does not have an advanced directive however her daughter, Melina Modena with support from Crystal [name] would be her medical decision maker.   I discussed at length patient's full code status with consideration of her current illness and co-morbidities. Patient verbalized understanding and expresses wishes to remain a full code. She states she is hopeful she will get better and wishes for all efforts to be put forward to allow this.   Patient and significant other is clear in expressed wishes to continue to treat the treatable. She is remaining hopeful for some improvement/stability with a goal of eventually returning home. She states she is supposed to find out if she can go to SNF rehab later today.   Of note Crystal [name] did attempt to call patient's daughter during discussion however she was  unavailable. He shares he plans to update her of discussions.   Hospice and Palliative Care services outpatient were explained and offered. Patient and family verbalized their understanding and awareness of both palliative and hospice's goals and philosophy of care. Recommendations for outpatient palliative in the setting of patient's expressed wishes to continue to treat. She verbalized understanding and agreement.   Assessment & Plan:   SUMMARY OF RECOMMENDATIONS   Full Code-as confirmed by patient.  Continue with current plan of care, treat the treatable Patient is clear in expressed wishes that she is remaining hopeful for improvement/stability with a goal of eventually returning home with Crystal [name]. States she is open to SNF rehab to allow her every opportunity to improve.  Recommendations for outpatient palliative PMT will continue to support and follow as needed. Please call team line with urgent needs.  Symptom Management:  Per Attending  Palliative Prophylaxis:  Bowel Regimen and Frequent Pain Assessment  Additional Recommendations (Limitations, Scope, Preferences): Continue to treat the treatable  Psycho-social/Spiritual:  Desire for further Chaplaincy support: no Additional Recommendations:  ongoing goals of care   Prognosis:  Unable to determine  Discharge Planning:  [place] for rehab with Palliative care service follow-up   Patient and significant other, Crystal [name] expressed understanding and was in agreement with this plan.   Time In: 1150 Time Out: 1240 Time Total: 50 min.   Visit consisted of counseling and education dealing with the complex and emotionally intense issues of symptom management and palliative care in the setting of serious and potentially life-threatening illness.Greater than 50%  of this time was spent counseling and coordinating care related to the above assessment and plan.  Signed by:  Alda Lea, AGPCNP-BC Palliative  Medicine Team  Phone: [phone] Pager: [phone] Amion: Bjorn Pippin   Thank you for allowing the Palliative Medicine Team to assist in the care of this patient. Please utilize secure chat with additional questions, if there is no response within 30 minutes please call the above phone number. Palliative Medicine Team providers are available by phone from 7am to 5pm daily and can be reached through the team cell phone.  Should this patient require assistance outside of these hours, please call the patient's attending physician.

## 2021-05-27 ENCOUNTER — Inpatient Hospital Stay: Payer: Medicare HMO

## 2021-05-27 LAB — CBC WITH DIFFERENTIAL/PLATELET
Abs Immature Granulocytes: 0.05 10*3/uL (ref 0.00–0.07)
Basophils Absolute: 0 10*3/uL (ref 0.0–0.1)
Basophils Relative: 0 %
Eosinophils Absolute: 0.1 10*3/uL (ref 0.0–0.5)
Eosinophils Relative: 1 %
HCT: 30.9 % — ABNORMAL LOW (ref 36.0–46.0)
Hemoglobin: 10.1 g/dL — ABNORMAL LOW (ref 12.0–15.0)
Immature Granulocytes: 1 %
Lymphocytes Relative: 10 %
Lymphs Abs: 1.1 10*3/uL (ref 0.7–4.0)
MCH: 27.4 pg (ref 26.0–34.0)
MCHC: 32.7 g/dL (ref 30.0–36.0)
MCV: 83.7 fL (ref 80.0–100.0)
Monocytes Absolute: 1.1 10*3/uL — ABNORMAL HIGH (ref 0.1–1.0)
Monocytes Relative: 10 %
Neutro Abs: 8.4 10*3/uL — ABNORMAL HIGH (ref 1.7–7.7)
Neutrophils Relative %: 78 %
Platelets: 135 10*3/uL — ABNORMAL LOW (ref 150–400)
RBC: 3.69 MIL/uL — ABNORMAL LOW (ref 3.87–5.11)
RDW: 19.1 % — ABNORMAL HIGH (ref 11.5–15.5)
WBC: 10.8 10*3/uL — ABNORMAL HIGH (ref 4.0–10.5)
nRBC: 0 % (ref 0.0–0.2)

## 2021-05-27 LAB — BASIC METABOLIC PANEL
Anion gap: 8 (ref 5–15)
BUN: 49 mg/dL — ABNORMAL HIGH (ref 8–23)
CO2: 29 mmol/L (ref 22–32)
Calcium: 8.6 mg/dL — ABNORMAL LOW (ref 8.9–10.3)
Chloride: 94 mmol/L — ABNORMAL LOW (ref 98–111)
Creatinine, Ser: 2.25 mg/dL — ABNORMAL HIGH (ref 0.44–1.00)
GFR, Estimated: 23 mL/min — ABNORMAL LOW (ref >60)
Glucose, Bld: 124 mg/dL — ABNORMAL HIGH (ref 70–99)
Potassium: 4.4 mmol/L (ref 3.5–5.1)
Sodium: 131 mmol/L — ABNORMAL LOW (ref 135–145)

## 2021-05-27 LAB — GLUCOSE, CAPILLARY
Glucose-Capillary: 117 mg/dL — ABNORMAL HIGH (ref 70–99)
Glucose-Capillary: 127 mg/dL — ABNORMAL HIGH (ref 70–99)
Glucose-Capillary: 142 mg/dL — ABNORMAL HIGH (ref 70–99)
Glucose-Capillary: 142 mg/dL — ABNORMAL HIGH (ref 70–99)
Glucose-Capillary: 152 mg/dL — ABNORMAL HIGH (ref 70–99)
Glucose-Capillary: 165 mg/dL — ABNORMAL HIGH (ref 70–99)

## 2021-05-27 LAB — AMMONIA: Ammonia: 34 umol/L (ref 9–35)

## 2021-05-27 MED ORDER — HEPARIN SOD (PORK) LOCK FLUSH 100 UNIT/ML IV SOLN
500.0000 [IU] | INTRAVENOUS | Status: DC
  Filled 2021-05-27: qty 5

## 2021-05-27 MED ORDER — HEPARIN SOD (PORK) LOCK FLUSH 100 UNIT/ML IV SOLN
500.0000 [IU] | INTRAVENOUS | Status: DC | PRN
  Filled 2021-05-27: qty 5

## 2021-05-27 NOTE — Progress Notes (Signed)
[facility]  ROUNDING NOTE   Subjective:   Crystal [name] is a 70 y.o. female with past medical conditions including anemia, CHF, cirrhosis, diabetes, GI bleed, and CKD4. Patient has been admitted to the hospital for Morbid obesity ([name]) [E66.01] Fracture of femur, distal, right, closed ([name]) [S72.401A] Cirrhosis of liver without ascites, unspecified hepatic cirrhosis type ([name]) [K74.60] Closed nondisplaced supracondylar fracture of distal end of right femur with intracondylar extension, initial encounter ([name]) [S72.464A] Congestive heart failure, unspecified HF chronicity, unspecified heart failure type ([name]) [I50.9]   We have been consulted to evaluate an acute kidney injury. She is a current patient of [facility], Dr Holley Raring. Patient resting in bed. Very drowsy. Unable to participate fully with visit. Baseline creatinine 1.73 and eGFR 32 on 05/11/21. Denies nausea and vomiting.   Patient seen later more alert, eating lunch. Reviewed and confirmed home diuretics.    Objective:  Vital signs in last 24 hours:  Temp:  [98 F (36.7 C)-99.2 F (37.3 C)] 99.2 F (37.3 C) (07/15 0835) Pulse Rate:  [74-102] 98 (07/15 0835) Resp:  [16-21] 21 (07/15 0835) BP: (112-125)/(53-66) 112/66 (07/15 0835) SpO2:  [92 %-94 %] 94 % (07/15 0835)  Weight change:  Filed Weights   05/23/21 1631  Weight: 98.8 kg    Intake/Output: I/O last 3 completed shifts: In: 240 [P.O.:240] Out: 1050 [Urine:1050]   Intake/Output this shift:  No intake/output data recorded.  Physical Exam: General: NAD, laying in bed  Head: Normocephalic, atraumatic. Moist oral mucosal membranes  Eyes: Anicteric  Lungs:  Clear to auscultation, normal breathing  Heart: Regular rate and rhythm  Abdomen:  Soft, nontender  Extremities:  trace peripheral edema.  Neurologic: Nonfocal, moving all four extremities  Skin: No lesions       Basic Metabolic Panel: Recent Labs  Lab 05/23/21 2240 05/25/21 0443  05/26/21 0446 05/27/21 0758  NA 125* 124* 128* 131*  K 3.0* 3.5 3.9 4.4  CL 83* 85* 90* 94*  CO2 _0 GLUCOSE 164* 163* 137* 124*  BUN 29* 36* 44* 49*  CREATININE 1.88* 1.85* 2.17* 2.25*  CALCIUM 9.0 8.7* 8.6* 8.6*  MG  --  1.8  --   --     Liver Function Tests: Recent Labs  Lab 05/26/21 0446  AST 25  ALT 11  ALKPHOS 134*  BILITOT 1.9*  PROT 5.7*  ALBUMIN 2.0*   No results for input(s): LIPASE, AMYLASE in the last 168 hours. Recent Labs  Lab 05/25/21 1230 05/26/21 1033 05/27/21 0758  AMMONIA 59* 76* 34    CBC: Recent Labs  Lab 05/23/21 2240 05/25/21 0443 05/27/21 0758  WBC 7.2 8.4 10.8*  NEUTROABS 5.8  --  8.4*  HGB 9.8* 9.7* 10.1*  HCT 29.9* 29.3* 30.9*  MCV 83.3 80.9 83.7  PLT 142* 120* 135*    Cardiac Enzymes: No results for input(s): CKTOTAL, CKMB, CKMBINDEX, TROPONINI in the last 168 hours.  BNP: Invalid input(s): POCBNP  CBG: Recent Labs  Lab 05/26/21 2348 05/27/21 0000 05/27/21 0350 05/27/21 0417 05/27/21 0741  GLUCAP 163* 142* 127* 142* 117*    Microbiology: Results for orders placed or performed during the hospital encounter of 05/23/21  Resp Panel by RT-PCR (Flu A&B, Covid) Nasopharyngeal Swab     Status: None   Collection Time: 05/23/21 10:50 PM   Specimen: Nasopharyngeal Swab; Nasopharyngeal(NP) swabs in vial transport medium  Result Value Ref Range Status   SARS Coronavirus 2 by RT PCR NEGATIVE NEGATIVE Final  Comment: (NOTE) SARS-CoV-2 target nucleic acids are NOT DETECTED.  The SARS-CoV-2 RNA is generally detectable in upper respiratory specimens during the acute phase of infection. The lowest concentration of SARS-CoV-2 viral copies this assay can detect is 138 copies/mL. A negative result does not preclude SARS-Cov-2 infection and should not be used as the sole basis for treatment or other patient management decisions. A negative result may occur with  improper specimen collection/handling, submission of  specimen other than nasopharyngeal swab, presence of viral mutation(s) within the areas targeted by this assay, and inadequate number of viral copies(<138 copies/mL). A negative result must be combined with clinical observations, patient history, and epidemiological information. The expected result is Negative.  Fact Sheet for Patients:  EntrepreneurPulse.com.au  Fact Sheet for Healthcare Providers:  IncredibleEmployment.be  This test is no t yet approved or cleared by the [place] FDA and  has been authorized for detection and/or diagnosis of SARS-CoV-2 by FDA under an Emergency Use Authorization (EUA). This EUA will remain  in effect (meaning this test can be used) for the duration of the COVID-19 declaration under Section 564(b)(1) of the Act, 21 U.S.C.section 360bbb-3(b)(1), unless the authorization is terminated  or revoked sooner.       Influenza A by PCR NEGATIVE NEGATIVE Final   Influenza B by PCR NEGATIVE NEGATIVE Final    Comment: (NOTE) The Xpert Xpress SARS-CoV-2/FLU/RSV plus assay is intended as an aid in the diagnosis of influenza from Nasopharyngeal swab specimens and should not be used as a sole basis for treatment. Nasal washings and aspirates are unacceptable for Xpert Xpress SARS-CoV-2/FLU/RSV testing.  Fact Sheet for Patients: EntrepreneurPulse.com.au  Fact Sheet for Healthcare Providers: IncredibleEmployment.be  This test is not yet approved or cleared by the [place] FDA and has been authorized for detection and/or diagnosis of SARS-CoV-2 by FDA under an Emergency Use Authorization (EUA). This EUA will remain in effect (meaning this test can be used) for the duration of the COVID-19 declaration under Section 564(b)(1) of the Act, 21 U.S.C. section 360bbb-3(b)(1), unless the authorization is terminated or revoked.  Performed at [facility], [address] [place], [place] [address]     Coagulation Studies: No results for input(s): LABPROT, INR in the last 72 hours.  Urinalysis: No results for input(s): COLORURINE, LABSPEC, PHURINE, GLUCOSEU, HGBUR, BILIRUBINUR, KETONESUR, PROTEINUR, UROBILINOGEN, NITRITE, LEUKOCYTESUR in the last 72 hours.  Invalid input(s): APPERANCEUR    Imaging: No results found.   Medications:     (feeding supplement) PROSource Plus  30 mL Oral TID BM   acetaminophen  1,000 mg Oral TID   Chlorhexidine Gluconate Cloth  6 each Topical Daily   furosemide  80 mg Oral BID   insulin aspart  0-20 Units Subcutaneous Q4H   lactulose  30 g Oral TID   magnesium oxide  400 mg Oral BID   multivitamin with minerals  1 tablet Oral Daily   pantoprazole  40 mg Oral BID   potassium chloride  40 mEq Oral BID   rifaximin  550 mg Oral BID   spironolactone  100 mg Oral Daily   albuterol, benzonatate, guaiFENesin-dextromethorphan, hydrOXYzine, ondansetron **OR** ondansetron (ZOFRAN) IV, oxyCODONE  Assessment/ Plan:  Crystal [name] is a 70 y.o.  female with past medical conditions including anemia, CHF, cirrhosis, diabetes, GI bleed, and CKD4. Patient has been admitted to the hospital for Morbid obesity ([place]) [E66.01] Fracture of femur, distal, right, closed ([place]) [S72.401A] Cirrhosis of liver without ascites, unspecified hepatic cirrhosis type (  [place]) [K74.60] Closed nondisplaced supracondylar fracture of distal end of right femur with intracondylar extension, initial encounter ([place]) [S72.464A] Congestive heart failure, unspecified HF chronicity, unspecified heart failure type ([place]) [I50.9]   Acute Kidney Injury on chronic kidney disease stage 4 with baseline creatinine 1.73 and GFR of 32 on 05/11/21.  Renal ultrasound ordered IV contrast exposure on 05/20/21 during cardiac cath No acute need for dialysis at this time Current creatinine is more inline with outpatient findings. Admission findings lower then baseline.  Continue current diuretic dosing. Will monitor  Lab Results  Component Value Date   CREATININE 2.25 (H) 05/27/2021   CREATININE 2.17 (H) 05/26/2021   CREATININE 1.85 (H) 05/25/2021    Intake/Output Summary (Last 24 hours) at 05/27/2021 1032 Last data filed at 05/27/2021 1010 Gross per 24 hour  Intake 240 ml  Output 1050 ml  Net -810 ml   2. Anemia of chronic kidney disease  Lab Results  Component Value Date   HGB 10.1 (L) 05/27/2021   Hgb at goal   3. Diabetes mellitus type II, unspecified  noninsulin dependent. Most recent hemoglobin A1c is 5.6 on 05/19/21.  Glucose stable    LOS: 4   7/15/202210:32 AM

## 2021-05-27 NOTE — Progress Notes (Signed)
Daily Progress Note   Patient Name: Crystal [name]       Date: 05/27/2021 DOB: [birth year]  Age: 70 y.o. MRN#: [id number] Attending Physician: Hosie Poisson, MD Primary Care Physician: Ileene Hutchinson, [place] Admit Date: 05/23/2021  Reason for Consultation/Follow-up: Establishing goals of care  Subjective: Chart Reviewed. Updates Received. Patient Assessed.   No acute distress. Denies shortness of breath. Does endorse leg pain with movement, minimal when at rest. Much more awake and alert. Daughter, [name] is at the bedside. Patient is feeding herself pizza and requesting something more to drink.   Reviewed previously discussed goals of care. Discussed with daughter who confirms wishes to treat the treatable. Both patient and daughter are clear in expressed wishes for SNF rehabilitation with a goal of eventually returning home with [name] (Significant Other).   Patient feels that her quality of life prior to admission was good and is hopeful to return to baseline.   She is not interested in outpatient palliative support at this time but is aware this can be initiated at anytime outpatient.   All questions answered and support provided.   Length of Stay: 4 days  Vital Signs: BP 112/66 (BP Location: Left Arm)   Pulse 98   Temp 99.2 F (37.3 C) (Oral)   Resp (!) 21   Ht 5' 2" (1.575 m)   Wt 98.8 kg   LMP  (LMP Unknown)   SpO2 94%   BMI 39.84 kg/m  SpO2: SpO2: 94 % O2 Device: O2 Device: Room Air O2 Flow Rate:    Physical Exam: NAD, awake and alert RRR CTA, normal breathing pattern AAO x3, mood appropriate              Palliative Care Assessment & Plan  HPI: Palliative Care consult requested for goals of care discussion in this 70 y.o. female  with past medical history of  dCHF, CKD4, GI bleed, NASH cirrhosis s/p TIPS, and diabetes. Patient is also being followed by Oncology for right lower lobe slow growing lung nodule which is under observation. She was admitted on 05/23/2021 from  home s/p fall with right knee pain. Per notations patient was ambulating with her cane and tripped over her shoe. No loss of consciousness. CT of knee showed nondisplaced right distal femur fracture. She is being followed by Orthopedics with recommendations for medical management (NWB on right lower extremity with plans to transition to a hinged brace after 1 week) given the significant risk related to surgery. PT recommending SNF rehabilitation.   Code Status: Full code  Goals of Care/Recommendations: Continue to treat the treatable Patient and daughter is remaining hopeful for continued improvement/strengthening. Goal is to eventually return home with family support after SNF rehabilitation.  Education provided on palliative. Patient declines at this time but is aware services can be initiated at anytime by discussing further with her medical team. Would like continued inpatient support.  PMT will continue to support and follow as needed. No weekend coverage. Goals are set and clear.   Prognosis: Guarded   Discharge Planning:  SNF rehab   Thank you for allowing the Palliative Medicine Team to assist in the care of this patient.  Time Total: 40 min.   Visit consisted of counseling and education dealing with the complex and emotionally intense issues of symptom management and palliative care in the setting of serious and potentially life-threatening illness.Greater than 50%  of this time was spent counseling and coordinating care related to the above assessment and plan.  Alda Lea, AGPCNP-BC  Palliative Medicine Team [phone]

## 2021-05-27 NOTE — Progress Notes (Signed)
Physical Therapy Treatment Patient Details Name: Crystal [name] MRN: [id number] DOB: January 20, 1951 Today's Date: 05/27/2021    History of Present Illness Pt is a 70 y.o. 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 CT knee showed nondisplaced fracture distal right femur with suprapatellar hemarthrosis.  Per orthopedic consult, pt to be NWB on RLE due to conservative management of fracture.    PT Comments    Pt alert, improvement in lethargy from previous sessions, able to participate during treatment. Pt education provided on importance of mobility, and provided HEP to maintain AROM and strength while in hospital.   Progressed to AROM of RLE abd, add with PT assist at end of ROM. Pt able to assist with scooting toward HOB with LLE, bed rails, requires Max-A x 2. Increased pain levels with scooting 10/10 to R shoulder,chest, intermittently RLE. Pain decreases after scooting, pt requested pain meds, nursing contacted. Skilled PT intervention is indicated to address deficits in function, mobility, and to return to PLOF as able.  Discharge recommendations remain SNF.   Follow Up Recommendations  SNF     Equipment Recommendations  Other (comment) (TBD next venue of care)    Recommendations for Other Services       Precautions / Restrictions Precautions Precautions: Fall Required Braces or Orthoses: Knee Immobilizer - Right Restrictions Weight Bearing Restrictions: Yes RLE Weight Bearing: Non weight bearing    Mobility  Bed Mobility Overal bed mobility: Needs Assistance             General bed mobility comments: Scooting toward HOB: pt utilzed handrails, LLE, max assist x 2    Transfers                 General transfer comment: deferred  Ambulation/Gait                  Stairs             Wheelchair Mobility    Modified Rankin (Stroke Patients Only)       Balance                                            Cognition                                              Exercises General Exercises - Lower Extremity Ankle Circles/Pumps: AROM;Both;10 reps;Supine Short Arc Quad: AROM;Strengthening;Left;10 reps;Supine Heel Slides: Left;10 reps;AROM;Supine Hip ABduction/ADduction: AROM;Both;10 reps;Supine Straight Leg Raises: AROM;Left;Supine (2 reps) Other Exercises Other Exercises: Initiated HEP during hospital stay, pt education on immobility and importance of pt participation during treatment    General Comments        Pertinent Vitals/Pain Pain Assessment: 0-10 Pain Score: 8  Pain Location: R knee, L shoulder Pain Descriptors / Indicators: Sore;Grimacing;Moaning;Guarding Pain Intervention(s): Limited activity within patient's tolerance;Monitored during session;Repositioned;Patient requesting pain meds-RN notified    Home Living                      Prior Function  PT Goals (current goals can now be found in the care plan section) Acute Rehab PT Goals Patient Stated Goal: to go home when she can PT Goal Formulation: With patient Time For Goal Achievement: 06/07/21 Potential to Achieve Goals: Good Progress towards PT goals: Progressing toward goals    Frequency    7X/week      PT Plan Current plan remains appropriate    Co-evaluation              AM-PAC PT "6 Clicks" Mobility   Outcome Measure  Help needed turning from your back to your side while in a flat bed without using bedrails?: Total Help needed moving from lying on your back to sitting on the side of a flat bed without using bedrails?: Total Help needed moving to and from a bed to a chair (including a wheelchair)?: Total Help needed standing up from a chair using your arms (e.g., wheelchair or  bedside chair)?: Total Help needed to walk in hospital room?: Total Help needed climbing 3-5 steps with a railing? : Total 6 Click Score: 6    End of Session   Activity Tolerance: Patient tolerated treatment well;Patient limited by pain Patient left: in bed;with call bell/phone within reach;with bed alarm set;with family/visitor present Nurse Communication: Mobility status;Patient requests pain meds PT Visit Diagnosis: Unsteadiness on feet (R26.81);Other abnormalities of gait and mobility (R26.89);Pain;Difficulty in walking, not elsewhere classified (R26.2);Muscle weakness (generalized) (M62.81) Pain - Right/Left: Right Pain - part of body: Knee;Leg     Time: 1443-1520 PT Time Calculation (min) (ACUTE ONLY): 37 min  Charges:                       [employer], SPT

## 2021-05-27 NOTE — Consult Note (Signed)
[facility] CM Inpatient Consult   05/27/2021  Yvonnie Schinke Ahner 03/24/1951 [id number]  Patient chart has been reviewed for readmissions less than 30 days and for high risk score for unplanned readmissions. Patient is currently active with [facility] CM chronic case management services, followed by community RN case Freight forwarder.   Per review, current recommendation is for skilled nursing facility. Will update community care Freight forwarder of patient progression.  Of note, [facility] Care Management services does not replace or interfere with any services that are arranged by inpatient case management or social work.   Netta Cedars, MSN, [facility] Liaison Nurse Mobile Phone [phone]  Toll free office [phone]

## 2021-05-27 NOTE — Progress Notes (Signed)
PROGRESS NOTE    Crystal [name]  VCB:[id number] DOB: 07-22-1951 DOA: 05/23/2021 PCP: Ileene Hutchinson, PA    Chief Complaint  Patient presents with   Fall    Brief Narrative:  Crystal [name] is a 70 y.o. 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   Patient usually ambulates with a walker, but was using her cane and tripped over her shoe.  No loss of consciousness, no preceding chest pain, palpitations, dizziness.     In the ER, CT showed nondisplaced femur fracture, distal right femur with suprapatellar hemarthrosis.  Orthopedics were consulted and the hospitalist service were asked to evaluate. Pt seen and examined at bedside. Pt is more alert today, when compared to yesterday.   Assessment & Plan:   Principal Problem:   Fracture of femur, distal, right, closed ([place]) Active Problems:   S/P TIPS (transjugular intrahepatic portosystemic shunt)   Coronary artery disease involving native coronary artery of native heart without angina pectoris   Liver cirrhosis secondary to NASH (HCC)   Chronic diastolic CHF (congestive heart failure) (HCC)   Diabetes mellitus, type 2 (HCC)   Chronic kidney disease, stage IV (severe) (HCC)   Closed fracture of distal end of right femur ([place])   Accidental fall   Mechanical fall leading to distal femur fracture on the right -Orthopedics consulted recommended nonsurgical management at this time. Therapy evaluations recommending SNF.  TOC on board, palliative care consulted for goals of care.  Pain control.     Acute hepatic encephalopathy;  Improved with lactulose enemas. Ammonia levels normalized.  MRI brain without contrast is negative for acute stroke.     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 Pt was lethargic in the last 2 days, improved today, she is more alert and answering questions appropriately.   Rifaximin added to her regimen Continue with lasix and spironolactone.    Acute on Stage IV CKD Slight worsening of creatinine from 1.8 to 2.2 Nephrology consulted for recommendations.     Chronic diastolic heart failure Appears compensated at this time Patient currently denies any shortness of breath or chest pain but lower extremity edema present. Resume lasix and spironolactone .    Hypokalemia replaced. Potassium 4.4, recheck levels in am.   Chronic hyponatremia.  Suspect a combination of cirrhosis and diuretic use. Elevated TSH, get free t4, levels.  serum osmolarity and urine osmolarity. Sodium has improved to 131 Continue to monitor.    Anemia of chronic disease No bleeding evident Transfuse to keep hemoglobin greater than 7.    Thrombocytopenia Probably secondary to cirrhosis, no bleeding .  Continue to monitor.   Diabetes mellitus, non insulin dependent.  CBG (last 3)  Recent Labs    05/27/21 0350 05/27/21 0417 05/27/21 0741  GLUCAP 127* 142* 117*    Continue with sliding scale insulin  Hemoglobin A1c is 5.6    DVT prophylaxis: SCD'S Code Status: FULL CODE Family Communication: none at bedside.  Disposition:   Status is: Inpatient  Remains inpatient appropriate because:Ongoing diagnostic testing needed not appropriate for outpatient work up, Unsafe d/c plan, and IV treatments appropriate due to intensity of illness or inability to take PO  Dispo: The patient is from: Home              Anticipated d/c is to: SNF  Patient currently is not medically stable to d/c.   Difficult to place patient No       Consultants:  Orthopedics   Procedures: none.   Antimicrobials: ( Antibiotics Given (last 72 hours)     Date/Time Action Medication Dose   05/25/21 2144 Given   rifaximin (XIFAXAN) tablet 550 mg 550 mg   05/26/21 0933 Given   rifaximin (XIFAXAN) tablet 550 mg 550 mg   05/26/21 2134 Given   rifaximin (XIFAXAN) tablet  550 mg 550 mg   05/27/21 1033 Given   rifaximin (XIFAXAN) tablet 550 mg 550 mg         Subjective: NO CHEST PAIN OR SOB.   Objective: Vitals:   05/26/21 2006 05/26/21 2345 05/27/21 0416 05/27/21 0835  BP: (!) 121/58 (!) 125/58 121/61 112/66  Pulse: 76 76 (!) 102 98  Resp: _0 (!) 21  Temp: 98.6 F (37 C) 99.1 F (37.3 C) 98.4 F (36.9 C) 99.2 F (37.3 C)  TempSrc:    Oral  SpO2: 92% 92% 92% 94%  Weight:      Height:        Intake/Output Summary (Last 24 hours) at 05/27/2021 1419 Last data filed at 05/27/2021 1326 Gross per 24 hour  Intake 0 ml  Output 1050 ml  Net -1050 ml    Filed Weights   05/23/21 1631  Weight: 98.8 kg    Examination:  General exam: Elderly woman, not in distress.  Respiratory system: Air entry fair, no wheezing heard. On RA.  Cardiovascular system: S1S2, RRR, no JVD, pedal edema present.  Gastrointestinal system: Abdomen is soft, non tender non distended , bowel sounds wnl.  Central nervous system: more alert today and answering simple questions.  Extremities: pedal edema present.  Skin: No rashes seen.  Psychiatry: mood is appropriate.     Data Reviewed: I have personally reviewed following labs and imaging studies  CBC: Recent Labs  Lab 05/23/21 2240 05/25/21 0443 05/27/21 0758  WBC 7.2 8.4 10.8*  NEUTROABS 5.8  --  8.4*  HGB 9.8* 9.7* 10.1*  HCT 29.9* 29.3* 30.9*  MCV 83.3 80.9 83.7  PLT 142* 120* 135*     Basic Metabolic Panel: Recent Labs  Lab 05/23/21 2240 05/25/21 0443 05/26/21 0446 05/27/21 0758  NA 125* 124* 128* 131*  K 3.0* 3.5 3.9 4.4  CL 83* 85* 90* 94*  CO2 _1 GLUCOSE 164* 163* 137* 124*  BUN 29* 36* 44* 49*  CREATININE 1.88* 1.85* 2.17* 2.25*  CALCIUM 9.0 8.7* 8.6* 8.6*  MG  --  1.8  --   --      GFR: Estimated Creatinine Clearance: 25.9 mL/min (A) (by C-G formula based on SCr of 2.25 mg/dL (H)).  Liver Function Tests: Recent Labs  Lab 05/26/21 0446  AST 25  ALT 11   ALKPHOS 134*  BILITOT 1.9*  PROT 5.7*  ALBUMIN 2.0*     CBG: Recent Labs  Lab 05/26/21 2348 05/27/21 0000 05/27/21 0350 05/27/21 0417 05/27/21 0741  GLUCAP 163* 142* 127* 142* 117*      Recent Results (from the past 240 hour(s))  Resp Panel by RT-PCR (Flu A&B, Covid) Nasopharyngeal Swab     Status: None   Collection Time: 05/23/21 10:50 PM   Specimen: Nasopharyngeal Swab; Nasopharyngeal(NP) swabs in vial transport medium  Result Value Ref Range Status   SARS Coronavirus 2 by RT PCR NEGATIVE NEGATIVE Final    Comment: (NOTE) SARS-CoV-2 target nucleic acids are  NOT DETECTED.  The SARS-CoV-2 RNA is generally detectable in upper respiratory specimens during the acute phase of infection. The lowest concentration of SARS-CoV-2 viral copies this assay can detect is 138 copies/mL. A negative result does not preclude SARS-Cov-2 infection and should not be used as the sole basis for treatment or other patient management decisions. A negative result may occur with  improper specimen collection/handling, submission of specimen other than nasopharyngeal swab, presence of viral mutation(s) within the areas targeted by this assay, and inadequate number of viral copies(<138 copies/mL). A negative result must be combined with clinical observations, patient history, and epidemiological information. The expected result is Negative.  Fact Sheet for Patients:  EntrepreneurPulse.com.au  Fact Sheet for Healthcare Providers:  IncredibleEmployment.be  This test is no t yet approved or cleared by the [place] FDA and  has been authorized for detection and/or diagnosis of SARS-CoV-2 by FDA under an Emergency Use Authorization (EUA). This EUA will remain  in effect (meaning this test can be used) for the duration of the COVID-19 declaration under Section 564(b)(1) of the Act, 21 U.S.C.section 360bbb-3(b)(1), unless the authorization is terminated   or revoked sooner.       Influenza A by PCR NEGATIVE NEGATIVE Final   Influenza B by PCR NEGATIVE NEGATIVE Final    Comment: (NOTE) The Xpert Xpress SARS-CoV-2/FLU/RSV plus assay is intended as an aid in the diagnosis of influenza from Nasopharyngeal swab specimens and should not be used as a sole basis for treatment. Nasal washings and aspirates are unacceptable for Xpert Xpress SARS-CoV-2/FLU/RSV testing.  Fact Sheet for Patients: EntrepreneurPulse.com.au  Fact Sheet for Healthcare Providers: IncredibleEmployment.be  This test is not yet approved or cleared by the [place] FDA and has been authorized for detection and/or diagnosis of SARS-CoV-2 by FDA under an Emergency Use Authorization (EUA). This EUA will remain in effect (meaning this test can be used) for the duration of the COVID-19 declaration under Section 564(b)(1) of the Act, 21 U.S.C. section 360bbb-3(b)(1), unless the authorization is terminated or revoked.  Performed at [facility], [address] [place],  [address]           Radiology Studies: MR BRAIN WO CONTRAST  Result Date: 05/27/2021 CLINICAL DATA:  Mental status change, unknown cause. EXAM: MRI HEAD WITHOUT CONTRAST TECHNIQUE: Multiplanar, multiecho pulse sequences of the brain and surrounding structures were obtained without intravenous contrast. COMPARISON:  Head CT May 23, 2021. FINDINGS: Brain: No acute infarction, hemorrhage, hydrocephalus, extra-axial collection or mass lesion. A few foci of T2 hyperintensity are seen within the white matter of the cerebral hemispheres, nonspecific. Vascular: Normal flow voids. Skull and upper cervical spine: Normal marrow signal. Sinuses/Orbits: Negative. Other: None. IMPRESSION: 1. No acute intracranial abnormality. 2. Small amount of nonspecific T2 hyperintense lesions of the white matter, may represent early chronic microangiopathy. Electronically  Signed   By: Pedro Earls M.D.   On: 05/27/2021 13:10        Scheduled Meds:  (feeding supplement) PROSource Plus  30 mL Oral TID BM   acetaminophen  1,000 mg Oral TID   Chlorhexidine Gluconate Cloth  6 each Topical Daily   furosemide  80 mg Oral BID   insulin aspart  0-20 Units Subcutaneous Q4H   lactulose  30 g Oral TID   magnesium oxide  400 mg Oral BID   multivitamin with minerals  1 tablet Oral Daily   pantoprazole  40 mg Oral BID   rifaximin  550 mg Oral  BID   spironolactone  100 mg Oral Daily   Continuous Infusions:   LOS: 4 days        Hosie Poisson, MD [facility]   To contact the attending provider between 7A-7P or the covering provider during after hours 7P-7A, please log into the web site [email or web address] and access using universal [place] password for that web site. If you do not have the password, please call the hospital operator.  05/27/2021, 2:19 PM

## 2021-05-27 NOTE — Care Management Important Message (Signed)
Important Message  Patient Details  Name: Crystal [name] MRN: [id number] Date of Birth: [birth year]   Medicare Important Message Given:  Yes     Juliann Pulse A Kawan [place] 05/27/2021, 10:30 AM

## 2021-05-28 ENCOUNTER — Inpatient Hospital Stay: Payer: Medicare HMO

## 2021-05-28 LAB — URINALYSIS, COMPLETE (UACMP) WITH MICROSCOPIC
Bilirubin Urine: NEGATIVE
Glucose, UA: NEGATIVE mg/dL
Hgb urine dipstick: NEGATIVE
Ketones, ur: NEGATIVE mg/dL
Nitrite: NEGATIVE
Protein, ur: NEGATIVE mg/dL
Specific Gravity, Urine: 1.009 (ref 1.005–1.030)
WBC, UA: >50 WBC/hpf — ABNORMAL HIGH (ref 0–5)
pH: 8 (ref 5.0–8.0)

## 2021-05-28 LAB — BASIC METABOLIC PANEL
Anion gap: 12 (ref 5–15)
BUN: 52 mg/dL — ABNORMAL HIGH (ref 8–23)
CO2: 25 mmol/L (ref 22–32)
Calcium: 8.7 mg/dL — ABNORMAL LOW (ref 8.9–10.3)
Chloride: 92 mmol/L — ABNORMAL LOW (ref 98–111)
Creatinine, Ser: 2.28 mg/dL — ABNORMAL HIGH (ref 0.44–1.00)
GFR, Estimated: 23 mL/min — ABNORMAL LOW (ref >60)
Glucose, Bld: 139 mg/dL — ABNORMAL HIGH (ref 70–99)
Potassium: 4 mmol/L (ref 3.5–5.1)
Sodium: 129 mmol/L — ABNORMAL LOW (ref 135–145)

## 2021-05-28 LAB — BLOOD GAS, ARTERIAL
Acid-Base Excess: 6.9 mmol/L — ABNORMAL HIGH (ref 0.0–2.0)
Bicarbonate: 30.3 mmol/L — ABNORMAL HIGH (ref 20.0–28.0)
FIO2: 0.21
O2 Saturation: 95.8 %
Patient temperature: 37
pCO2 arterial: 38 mmHg (ref 32.0–48.0)
pH, Arterial: 7.51 — ABNORMAL HIGH (ref 7.350–7.450)
pO2, Arterial: 72 mmHg — ABNORMAL LOW (ref 83.0–108.0)

## 2021-05-28 LAB — GLUCOSE, CAPILLARY
Glucose-Capillary: 143 mg/dL — ABNORMAL HIGH (ref 70–99)
Glucose-Capillary: 145 mg/dL — ABNORMAL HIGH (ref 70–99)
Glucose-Capillary: 157 mg/dL — ABNORMAL HIGH (ref 70–99)
Glucose-Capillary: 157 mg/dL — ABNORMAL HIGH (ref 70–99)
Glucose-Capillary: 161 mg/dL — ABNORMAL HIGH (ref 70–99)
Glucose-Capillary: 205 mg/dL — ABNORMAL HIGH (ref 70–99)

## 2021-05-28 LAB — AMMONIA: Ammonia: 20 umol/L (ref 9–35)

## 2021-05-28 MED ORDER — HEPARIN SODIUM (PORCINE) 5000 UNIT/ML IJ SOLN
5000.0000 [IU] | Freq: Three times a day (TID) | INTRAMUSCULAR | Status: DC
  Administered 2021-05-28 – 2021-06-03 (×18): 5000 [IU] via SUBCUTANEOUS
  Filled 2021-05-28 (×17): qty 1

## 2021-05-28 MED ORDER — APIXABAN 5 MG PO TABS: 5.0000 mg | ORAL_TABLET | Freq: Two times a day (BID) | ORAL | Status: DC

## 2021-05-28 MED ORDER — ACETAMINOPHEN 500 MG PO TABS
500.0000 mg | ORAL_TABLET | Freq: Four times a day (QID) | ORAL | Status: DC | PRN
  Administered 2021-05-30 – 2021-06-01 (×3): 500 mg via ORAL
  Filled 2021-05-28 (×3): qty 1

## 2021-05-28 MED ORDER — SODIUM CHLORIDE 0.9 % IV SOLN
2.0000 g | INTRAVENOUS | Status: DC
  Filled 2021-05-28: qty 2

## 2021-05-28 MED ORDER — LACTULOSE ENEMA
300.0000 mL | Freq: Once | ORAL | Status: DC
  Filled 2021-05-28: qty 300

## 2021-05-28 MED ORDER — FUROSEMIDE 40 MG PO TABS
80.0000 mg | ORAL_TABLET | Freq: Two times a day (BID) | ORAL | Status: DC
  Administered 2021-05-29: 80 mg via ORAL
  Filled 2021-05-28: qty 2

## 2021-05-28 MED ORDER — SODIUM CHLORIDE 0.9 % IV SOLN
2.0000 g | Freq: Once | INTRAVENOUS | Status: AC
  Administered 2021-05-28: 2 g via INTRAVENOUS
  Filled 2021-05-28: qty 2

## 2021-05-28 MED ORDER — INSULIN ASPART 100 UNIT/ML IJ SOLN
0.0000 [IU] | Freq: Three times a day (TID) | INTRAMUSCULAR | Status: DC
  Administered 2021-05-28 – 2021-05-29 (×2): 2 [IU] via SUBCUTANEOUS
  Administered 2021-05-29 – 2021-05-30 (×4): 1 [IU] via SUBCUTANEOUS
  Administered 2021-05-31: 2 [IU] via SUBCUTANEOUS
  Administered 2021-05-31: 1 [IU] via SUBCUTANEOUS
  Administered 2021-05-31 – 2021-06-01 (×2): 2 [IU] via SUBCUTANEOUS
  Administered 2021-06-01 – 2021-06-02 (×3): 3 [IU] via SUBCUTANEOUS
  Administered 2021-06-02 (×2): 1 [IU] via SUBCUTANEOUS
  Administered 2021-06-03: 2 [IU] via SUBCUTANEOUS
  Filled 2021-05-28 (×15): qty 1

## 2021-05-28 NOTE — Progress Notes (Addendum)
PROGRESS NOTE    Crystal [name]  SNK:[id number] DOB: 14-Sep-1951 DOA: 05/23/2021 PCP: Ileene Hutchinson, PA    Chief Complaint  Patient presents with   Fall    Brief Narrative:  Crystal [name] is a 70 y.o. 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   Patient usually ambulates with a walker, but was using her cane and tripped over her shoe.  No loss of consciousness, no preceding chest pain, palpitations, dizziness.     In the ER, CT showed nondisplaced femur fracture, distal right femur with suprapatellar hemarthrosis.  Orthopedics were consulted and the hospitalist service were asked to evaluate. Pt seen and examined at bedside. She is trying to eat breakfast, requesting help with food.   Assessment & Plan:   Principal Problem:   Fracture of femur, distal, right, closed ([place]) Active Problems:   S/P TIPS (transjugular intrahepatic portosystemic shunt)   Coronary artery disease involving native coronary artery of native heart without angina pectoris   Liver cirrhosis secondary to NASH (HCC)   Chronic diastolic CHF (congestive heart failure) (HCC)   Diabetes mellitus, type 2 (HCC)   Chronic kidney disease, stage IV (severe) (HCC)   Closed fracture of distal end of right femur ([place])   Accidental fall   Mechanical fall leading to distal femur fracture on the right -Orthopedics consulted recommended nonsurgical management at this time. Therapy evaluations recommending SNF.  TOC on board, palliative care consulted for goals of care, after discussing with family they wanted full scope of treatment.  Pain control.     Acute hepatic encephalopathy and Acute metabolic encephalopathy:  Initially Improved with lactulose enemas. Ammonia levels normalized.  MRI brain without contrast is negative for acute stroke. But she has intermittent lethargy . Repeat ammonia levels, get ABG to see if she is hypercarbic, suspect a  component of Uremia. If ammonia levels normalize and ABG is wnl, will get neurology consult for further evaluation.  Therapy evaluations recommending SNF.     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 She continues to have waxing and waning of mental status over the last 2 to 3 days. No signs of infection so far.  Will check US abdomen to see if she has ascites.  Rifaximin added to her regimen Continue with lasix and spironolactone.    Acute on Stage IV CKD Slight worsening of creatinine from 1.8 to 2.2 and plateaued at 2.2. BUN increased from 28 to 52. IV contrast exposure earlier this month on 05/20/21 for cardiac cath.  Nephrology consulted for recommendations.     Chronic diastolic heart failure Appears compensated at this time Patient currently denies any shortness of breath or chest pain but lower extremity edema present. Resume lasix and spironolactone .    Hypokalemia replaced.    Chronic hyponatremia.  Suspect a combination of cirrhosis and diuretic use. Elevated TSH, get free t4, levels.  serum osmolarity is 277 and urine osmolarity and urine sodium are pending. . Sodium is 129 today.  Continue to monitor.    Anemia of chronic disease No bleeding evident Transfuse to keep hemoglobin greater than 7.    Thrombocytopenia Probably secondary to cirrhosis, no bleeding .  Continue to monitor.   Diabetes mellitus, non insulin dependent.  CBG (last 3)  Recent Labs    05/28/21 0049 05/28/21 0413 05/28/21 0752  GLUCAP 157* 143* 145*  Continue with sliding scale insulin  Hemoglobin A1c is 5.6.   Bilateral lower extremity edema;  Check venous duplex to evaluate for DVT.     DVT prophylaxis: SCD'S Code Status: FULL CODE Family Communication: none at bedside.  Disposition:   Status is: Inpatient  Remains inpatient appropriate because:Ongoing diagnostic testing needed not appropriate for  outpatient work up, Unsafe d/c plan, and IV treatments appropriate due to intensity of illness or inability to take PO  Dispo: The patient is from: Home              Anticipated d/c is to: SNF              Patient currently is not medically stable to d/c.   Difficult to place patient No       Consultants:  Orthopedics   Procedures: none.   Antimicrobials: ( Antibiotics Given (last 72 hours)     Date/Time Action Medication Dose   05/25/21 2144 Given   rifaximin (XIFAXAN) tablet 550 mg 550 mg   05/26/21 0933 Given   rifaximin (XIFAXAN) tablet 550 mg 550 mg   05/26/21 2134 Given   rifaximin (XIFAXAN) tablet 550 mg 550 mg   05/27/21 1033 Given   rifaximin (XIFAXAN) tablet 550 mg 550 mg   05/27/21 2118 Given   rifaximin (XIFAXAN) tablet 550 mg 550 mg         Subjective: Trying to eat breakfast. No chest pain or sob.   Objective: Vitals:   05/27/21 0835 05/27/21 2011 05/28/21 0411 05/28/21 0752  BP: 112/66 122/66 128/70 134/65  Pulse: 98 88 97 95  Resp: (!) 21 18 (!) 24 19  Temp: 99.2 F (37.3 C) 98.5 F (36.9 C) 98.5 F (36.9 C) 98.8 F (37.1 C)  TempSrc: Oral  Oral Oral  SpO2: 94% 93% 92% 94%  Weight:      Height:        Intake/Output Summary (Last 24 hours) at 05/28/2021 0803 Last data filed at 05/28/2021 0612 Gross per 24 hour  Intake 0 ml  Output 1450 ml  Net -1450 ml    Filed Weights   05/23/21 1631  Weight: 98.8 kg    Examination:  General exam: elderly woman, not in distress.  Respiratory system: diminished air entry at bases, on RA. No tachypnea.  Cardiovascular system: S1S2, RRR, no JVD, pedal edema present.  Gastrointestinal system: Abdomen is soft, NT ND BS+ Central nervous system: lethargic this morning. Answering simple questions.  Extremities: Bilateral lower extremity edema.  Skin: No rashes seen.  Psychiatry: mood is appropriate.     Data Reviewed: I have personally reviewed following labs and imaging studies  CBC: Recent  Labs  Lab 05/23/21 2240 05/25/21 0443 05/27/21 0758  WBC 7.2 8.4 10.8*  NEUTROABS 5.8  --  8.4*  HGB 9.8* 9.7* 10.1*  HCT 29.9* 29.3* 30.9*  MCV 83.3 80.9 83.7  PLT 142* 120* 135*     Basic Metabolic Panel: Recent Labs  Lab 05/23/21 2240 05/25/21 0443 05/26/21 0446 05/27/21 0758  NA 125* 124* 128* 131*  K 3.0* 3.5 3.9 4.4  CL 83* 85* 90* 94*  CO2 _0 GLUCOSE 164* 163* 137* 124*  BUN 29* 36* 44* 49*  CREATININE 1.88* 1.85* 2.17* 2.25*  CALCIUM 9.0 8.7* 8.6* 8.6*  MG  --  1.8  --   --      GFR: Estimated Creatinine Clearance: 25.9 mL/min (A) (by C-G formula based on SCr of 2.25 mg/dL (H)).  Liver Function Tests: Recent Labs  Lab 05/26/21 0446  AST 25  ALT 11  ALKPHOS 134*  BILITOT 1.9*  PROT 5.7*  ALBUMIN 2.0*     CBG: Recent Labs  Lab 05/27/21 1642 05/27/21 1955 05/28/21 0049 05/28/21 0413 05/28/21 0752  GLUCAP 152* 165* 157* 143* 145*      Recent Results (from the past 240 hour(s))  Resp Panel by RT-PCR (Flu A&B, Covid) Nasopharyngeal Swab     Status: None   Collection Time: 05/23/21 10:50 PM   Specimen: Nasopharyngeal Swab; Nasopharyngeal(NP) swabs in vial transport medium  Result Value Ref Range Status   SARS Coronavirus 2 by RT PCR NEGATIVE NEGATIVE Final    Comment: (NOTE) SARS-CoV-2 target nucleic acids are NOT DETECTED.  The SARS-CoV-2 RNA is generally detectable in upper respiratory specimens during the acute phase of infection. The lowest concentration of SARS-CoV-2 viral copies this assay can detect is 138 copies/mL. A negative result does not preclude SARS-Cov-2 infection and should not be used as the sole basis for treatment or other patient management decisions. A negative result may occur with  improper specimen collection/handling, submission of specimen other than nasopharyngeal swab, presence of viral mutation(s) within the areas targeted by this assay, and inadequate number of viral copies(<138 copies/mL). A  negative result must be combined with clinical observations, patient history, and epidemiological information. The expected result is Negative.  Fact Sheet for Patients:  EntrepreneurPulse.com.au  Fact Sheet for Healthcare Providers:  IncredibleEmployment.be  This test is no t yet approved or cleared by the [place] FDA and  has been authorized for detection and/or diagnosis of SARS-CoV-2 by FDA under an Emergency Use Authorization (EUA). This EUA will remain  in effect (meaning this test can be used) for the duration of the COVID-19 declaration under Section 564(b)(1) of the Act, 21 U.S.C.section 360bbb-3(b)(1), unless the authorization is terminated  or revoked sooner.       Influenza A by PCR NEGATIVE NEGATIVE Final   Influenza B by PCR NEGATIVE NEGATIVE Final    Comment: (NOTE) The Xpert Xpress SARS-CoV-2/FLU/RSV plus assay is intended as an aid in the diagnosis of influenza from Nasopharyngeal swab specimens and should not be used as a sole basis for treatment. Nasal washings and aspirates are unacceptable for Xpert Xpress SARS-CoV-2/FLU/RSV testing.  Fact Sheet for Patients: EntrepreneurPulse.com.au  Fact Sheet for Healthcare Providers: IncredibleEmployment.be  This test is not yet approved or cleared by the [place] FDA and has been authorized for detection and/or diagnosis of SARS-CoV-2 by FDA under an Emergency Use Authorization (EUA). This EUA will remain in effect (meaning this test can be used) for the duration of the COVID-19 declaration under Section 564(b)(1) of the Act, 21 U.S.C. section 360bbb-3(b)(1), unless the authorization is terminated or revoked.  Performed at [facility], [address] [place], [place] [address]           Radiology Studies: MR BRAIN WO CONTRAST  Result Date: 05/27/2021 CLINICAL DATA:  Mental status change, unknown cause. EXAM:  MRI HEAD WITHOUT CONTRAST TECHNIQUE: Multiplanar, multiecho pulse sequences of the brain and surrounding structures were obtained without intravenous contrast. COMPARISON:  Head CT May 23, 2021. FINDINGS: Brain: No acute infarction, hemorrhage, hydrocephalus, extra-axial collection or mass lesion. A few foci of T2 hyperintensity are seen within the white matter of the cerebral hemispheres, nonspecific. Vascular: Normal flow voids. Skull and upper cervical spine: Normal marrow signal. Sinuses/Orbits: Negative. Other: None. IMPRESSION: 1. No acute intracranial abnormality. 2. Small amount of  nonspecific T2 hyperintense lesions of the white matter, may represent early chronic microangiopathy. Electronically Signed   By: Pedro Earls M.D.   On: 05/27/2021 13:10   US RENAL  Result Date: 05/27/2021 CLINICAL DATA:  Acute kidney injury EXAM: RENAL / URINARY TRACT ULTRASOUND COMPLETE COMPARISON:  None. FINDINGS: Right Kidney: Renal measurements: 10.5 x 4.4 x 3.7 cm = volume: 90.5 mL. Echogenicity within normal limits. No mass or hydronephrosis visualized. Left Kidney: Renal measurements: 10.3 x 4.0 x 4.7 cm = volume: 100 mL. Echogenicity within normal limits. No mass or hydronephrosis visualized. Bladder: Appears normal for degree of bladder distention. Other: None. IMPRESSION: Negative renal ultrasound.  No hydronephrosis. Electronically Signed   By: Julian Hy M.D.   On: 05/27/2021 19:13        Scheduled Meds:  (feeding supplement) PROSource Plus  30 mL Oral TID BM   acetaminophen  1,000 mg Oral TID   Chlorhexidine Gluconate Cloth  6 each Topical Daily   furosemide  80 mg Oral BID   heparin lock flush  500 Units Intracatheter Q30 days   insulin aspart  0-20 Units Subcutaneous Q4H   lactulose  30 g Oral TID   magnesium oxide  400 mg Oral BID   multivitamin with minerals  1 tablet Oral Daily   pantoprazole  40 mg Oral BID   rifaximin  550 mg Oral BID   spironolactone  100 mg  Oral Daily   Continuous Infusions:   LOS: 5 days        Hosie Poisson, MD [facility]   To contact the attending provider between 7A-7P or the covering provider during after hours 7P-7A, please log into the web site [email or web address] and access using universal [place] password for that web site. If you do not have the password, please call the hospital operator.  05/28/2021, 8:03 AM    Addendum:   RN notified pt received oxycodone yesterday evening.  Pt mental status continue to  decline. Ammonia level wnl.  She became tachycaridic, EKG shows sinus tachycardic.  Work up for sepsis ordered.  Blood cultures ordered, UA shows leukocytosis and many bacteria.  Urine cultures ordered.  CXR ordered.  Venous duplex of the lower extremity show non occlusive DVTi n the right popliteal vein.  Unable to anti coagulate due to history of GI bleed.  Started her on DVT prophylaxis with heparin.    Hosie Poisson MD

## 2021-05-28 NOTE — Consult Note (Signed)
Pharmacy Antibiotic Note  Crystal [name] is a 70 y.o. female admitted on 05/23/2021 with knee pain s/p mechanical fall. Found to have nondisplaced femur fracture. Patient with hx of NASH s/p TIPS and recent GIB. 7/17 worsening lethargy, AMS, and tachycardia.  Pharmacy has been consulted for cefepime dosing concerning for sepsis  Plan: Cefepime 2 gram Q24H based on current renal function Follow up culture results  Height: 5' 2" (157.5 cm) Weight: 98.8 kg (217 lb 13 oz) IBW/kg (Calculated) : 50.1  Temp (24hrs), Avg:98.7 F (37.1 C), Min:98.5 F (36.9 C), Max:98.8 F (37.1 C)  Recent Labs  Lab 05/23/21 2240 05/25/21 0443 05/26/21 0446 05/27/21 0758 05/28/21 0933  WBC 7.2 8.4  --  10.8*  --   CREATININE 1.88* 1.85* 2.17* 2.25* 2.28*    Estimated Creatinine Clearance: 25.6 mL/min (A) (by C-G formula based on SCr of 2.28 mg/dL (H)).    No Known Allergies  Antimicrobials this admission: 7/16 cefepime >>    Dose adjustments this admission: N/a  Microbiology results: 7/16 BCx: sent   Thank you for allowing pharmacy to be a part of this patient's care.  Dorothe Pea, PharmD, BCPS Clinical Pharmacist   05/28/2021 6:28 PM

## 2021-05-28 NOTE — Progress Notes (Signed)
PT Cancellation Note  Patient Details Name: ANGELAMARIE AVAKIAN MRN: [id number] DOB: 04/29/1951   Cancelled Treatment:     PT attempt. PT hold. Pt is very lethargic and unable to stay awake during conversation. RN made aware and informed Pryor Curia she has been progressively getting more lethargic/confused. Will hold PT at this time and return when pt is more appropriate to participate.     Willette Pa 05/28/2021, 10:57 AM

## 2021-05-28 NOTE — Progress Notes (Addendum)
[place] for Apixaban Indication: DVT  No Known Allergies  Patient Measurements: Height: 5' 2" (157.5 cm) Weight: 98.8 kg (217 lb 13 oz) IBW/kg (Calculated) : 50.1  Vital Signs: Temp: 98.8 F (37.1 C) (07/16 0752) Temp Source: Oral (07/16 0752) BP: 131/66 (07/16 1238) Pulse Rate: 103 (07/16 1238)  Labs: Recent Labs    05/26/21 0446 05/27/21 0758 05/28/21 0933  HGB  --  10.1*  --   HCT  --  30.9*  --   PLT  --  135*  --   CREATININE 2.17* 2.25* 2.28*    Estimated Creatinine Clearance: 25.6 mL/min (A) (by C-G formula based on SCr of 2.28 mg/dL (H)).   Medical History: Past Medical History:  Diagnosis Date   Acute GI bleeding 02/09/2018   Acute upper gastrointestinal bleeding 08/03/2018   Anemia    TAKES IRON TAB   Arthritis    SHOULDER   B12 deficiency 02/18/2018   Bleeding    GI  3/19   Bronchitis    HX OF   CHF (congestive heart failure) (HCC)    Cirrhosis (HCC)    Colon cancer screening    Diabetes mellitus without complication (HCC)    TYPE 2   Fatty (change of) liver, not elsewhere classified    Full dentures    UPPER AND LOWER   Iron deficiency anemia due to chronic blood loss 02/18/2018   Lung nodule, multiple    Pernicious anemia 02/22/2018   PUD (peptic ulcer disease)    Raynaud's syndrome    Upper GI bleeding 08/03/2018    Assessment: 70 y.o. 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 Pharmacy has been consulted for apixaban dosing and monitoring. Pt has been on apixaban in the past (June 2022) for acute R popliteal DVT and chronic left common femoral DVT but had been readmitted with upper GIB ~3 weeks later. Her anticoagulation was held and an IVC filter placed. Discussed anticoagulation and bleed history with MD.   7/16 [place] lower extremity: Subacute and partially recanalized nonocclusive DVT in the right popliteal vein. Acute  occlusive superficial thrombophlebitis involving the right small saphenous vein. Minimal sequelae of remote prior DVT on the left at the saphenofemoral junction   Goal of Therapy:  Monitor platelets by anticoagulation protocol: Yes   Plan:  Given hx of GIB on apixaban, will not give initial loading dose of apixaban and start pt on apixaban 5 mg BID Notified MD - okay to not load and will discuss with GI Monitor CBC per protocol  *ADDENDUM*: After discussion with family MD messaged and decided to d/c Eliquis and start pt on prophylactic [place], PharmD Clinical Pharmacist 05/28/2021,3:23 PM

## 2021-05-28 NOTE — TOC Progression Note (Signed)
Transition of Care [facility]) - Progression Note    Patient Details  Name: Crystal [name] MRN: [id number] Date of Birth: 1951/01/08  Transition of Care [facility]) CM/SW Contact  Izola Price, RN Phone Number: 05/28/2021, 12:44 PM  Clinical Narrative:  More lethargic with altered mental status per RN in rounds and per PT notes. [facility] following patient as well. [place] when medically stable. Simmie Davies RN CM      Expected Discharge Plan: Skilled Nursing Facility Barriers to Discharge: Continued Medical Work up  Expected Discharge Plan and Services Expected Discharge Plan: [place]   Discharge Planning Services: CM Consult                                           Social Determinants of Health (SDOH) Interventions    Readmission Risk Interventions Readmission Risk Prevention Plan 04/28/2021 04/07/2021  Transportation Screening Complete Complete  Medication Review Press photographer) Complete Complete  PCP or Specialist appointment within 3-5 days of discharge - Complete  HRI or [place] Complete Complete  SW Recovery Care/Counseling Consult - Complete  Palliative Care Screening Not Applicable Not [place] Not Applicable Not Applicable  Some recent data might be hidden

## 2021-05-28 NOTE — Progress Notes (Addendum)
[facility]  ROUNDING NOTE   Subjective:   Crystal [name] is a 70 y.o. female with past medical conditions including anemia, CHF, cirrhosis, diabetes, GI bleed, and CKD4. Patient has been admitted to the hospital for Morbid obesity ([name]) [E66.01] Fracture of femur, distal, right, closed ([place]) [S72.401A] Cirrhosis of liver without ascites, unspecified hepatic cirrhosis type ([place]) [K74.60] Closed nondisplaced supracondylar fracture of distal end of right femur with intracondylar extension, initial encounter ([place]) [S72.464A] Congestive heart failure, unspecified HF chronicity, unspecified heart failure type [facility]) [I50.9]   Nephrology was consulted to evaluate an acute kidney injury.  She is a current patient of [facility], Dr Holley Raring. Patient resting in bed.  Patient seen today on the first floor Patient family was present in the room Patient offers no new specific physical complaints   Objective:  Vital signs in last 24 hours:  Temp:  [98.5 F (36.9 C)-98.8 F (37.1 C)] 98.8 F (37.1 C) (07/16 0752) Pulse Rate:  [88-103] 103 (07/16 1238) Resp:  [18-24] 18 (07/16 1238) BP: (122-134)/(65-70) 131/66 (07/16 1238) SpO2:  [92 %-94 %] 94 % (07/16 1238)  Weight change:  Filed Weights   05/23/21 1631  Weight: 98.8 kg    Intake/Output: I/O last 3 completed shifts: In: 0  Out: 2300 [Urine:2300]   Intake/Output this shift:  Total I/O In: -  Out: 400 [Urine:400]  Physical Exam: General: NAD, laying in bed  Head: Normocephalic, atraumatic. Moist oral mucosal membranes  Eyes: Anicteric  Lungs:  Clear to auscultation, normal breathing  Heart: Regular rate and rhythm  Abdomen:  Soft, nontender  Extremities:  trace peripheral edema.  Neurologic: Nonfocal, moving all four extremities  Skin: No lesions       Basic Metabolic Panel: Recent Labs  Lab 05/23/21 2240 05/25/21 0443 05/26/21 0446 05/27/21 0758 05/28/21 0933  NA 125* 124* 128* 131* 129*  K 3.0* 3.5 3.9 4.4  4.0  CL 83* 85* 90* 94* 92*  CO2 _0 GLUCOSE 164* 163* 137* 124* 139*  BUN 29* 36* 44* 49* 52*  CREATININE 1.88* 1.85* 2.17* 2.25* 2.28*  CALCIUM 9.0 8.7* 8.6* 8.6* 8.7*  MG  --  1.8  --   --   --        Liver Function Tests: Recent Labs  Lab 05/26/21 0446  AST 25  ALT 11  ALKPHOS 134*  BILITOT 1.9*  PROT 5.7*  ALBUMIN 2.0*   No results for input(s): LIPASE, AMYLASE in the last 168 hours. Recent Labs  Lab 05/25/21 1230 05/26/21 1033 05/27/21 0758  AMMONIA 59* 76* 34    CBC: Recent Labs  Lab 05/23/21 2240 05/25/21 0443 05/27/21 0758  WBC 7.2 8.4 10.8*  NEUTROABS 5.8  --  8.4*  HGB 9.8* 9.7* 10.1*  HCT 29.9* 29.3* 30.9*  MCV 83.3 80.9 83.7  PLT 142* 120* 135*    Cardiac Enzymes: No results for input(s): CKTOTAL, CKMB, CKMBINDEX, TROPONINI in the last 168 hours.  BNP: Invalid input(s): POCBNP  CBG: Recent Labs  Lab 05/27/21 1955 05/28/21 0049 05/28/21 0413 05/28/21 0752 05/28/21 1216  GLUCAP 165* 157* 143* 145* 161*    Microbiology: Results for orders placed or performed during the hospital encounter of 05/23/21  Resp Panel by RT-PCR (Flu A&B, Covid) Nasopharyngeal Swab     Status: None   Collection Time: 05/23/21 10:50 PM   Specimen: Nasopharyngeal Swab; Nasopharyngeal(NP) swabs in vial transport medium  Result Value Ref Range Status   SARS Coronavirus 2 by  RT PCR NEGATIVE NEGATIVE Final    Comment: (NOTE) SARS-CoV-2 target nucleic acids are NOT DETECTED.  The SARS-CoV-2 RNA is generally detectable in upper respiratory specimens during the acute phase of infection. The lowest concentration of SARS-CoV-2 viral copies this assay can detect is 138 copies/mL. A negative result does not preclude SARS-Cov-2 infection and should not be used as the sole basis for treatment or other patient management decisions. A negative result may occur with  improper specimen collection/handling, submission of specimen other than nasopharyngeal  swab, presence of viral mutation(s) within the areas targeted by this assay, and inadequate number of viral copies(<138 copies/mL). A negative result must be combined with clinical observations, patient history, and epidemiological information. The expected result is Negative.  Fact Sheet for Patients:  EntrepreneurPulse.com.au  Fact Sheet for Healthcare Providers:  IncredibleEmployment.be  This test is no t yet approved or cleared by the [place] FDA and  has been authorized for detection and/or diagnosis of SARS-CoV-2 by FDA under an Emergency Use Authorization (EUA). This EUA will remain  in effect (meaning this test can be used) for the duration of the COVID-19 declaration under Section 564(b)(1) of the Act, 21 U.S.C.section 360bbb-3(b)(1), unless the authorization is terminated  or revoked sooner.       Influenza A by PCR NEGATIVE NEGATIVE Final   Influenza B by PCR NEGATIVE NEGATIVE Final    Comment: (NOTE) The Xpert Xpress SARS-CoV-2/FLU/RSV plus assay is intended as an aid in the diagnosis of influenza from Nasopharyngeal swab specimens and should not be used as a sole basis for treatment. Nasal washings and aspirates are unacceptable for Xpert Xpress SARS-CoV-2/FLU/RSV testing.  Fact Sheet for Patients: EntrepreneurPulse.com.au  Fact Sheet for Healthcare Providers: IncredibleEmployment.be  This test is not yet approved or cleared by the [place] FDA and has been authorized for detection and/or diagnosis of SARS-CoV-2 by FDA under an Emergency Use Authorization (EUA). This EUA will remain in effect (meaning this test can be used) for the duration of the COVID-19 declaration under Section 564(b)(1) of the Act, 21 U.S.C. section 360bbb-3(b)(1), unless the authorization is terminated or revoked.  Performed at [facility], [address] [place], [place] [address]      Coagulation Studies: No results for input(s): LABPROT, INR in the last 72 hours.  Urinalysis: No results for input(s): COLORURINE, LABSPEC, PHURINE, GLUCOSEU, HGBUR, BILIRUBINUR, KETONESUR, PROTEINUR, UROBILINOGEN, NITRITE, LEUKOCYTESUR in the last 72 hours.  Invalid input(s): APPERANCEUR    Imaging: MR BRAIN WO CONTRAST  Result Date: 05/27/2021 CLINICAL DATA:  Mental status change, unknown cause. EXAM: MRI HEAD WITHOUT CONTRAST TECHNIQUE: Multiplanar, multiecho pulse sequences of the brain and surrounding structures were obtained without intravenous contrast. COMPARISON:  Head CT May 23, 2021. FINDINGS: Brain: No acute infarction, hemorrhage, hydrocephalus, extra-axial collection or mass lesion. A few foci of T2 hyperintensity are seen within the white matter of the cerebral hemispheres, nonspecific. Vascular: Normal flow voids. Skull and upper cervical spine: Normal marrow signal. Sinuses/Orbits: Negative. Other: None. IMPRESSION: 1. No acute intracranial abnormality. 2. Small amount of nonspecific T2 hyperintense lesions of the white matter, may represent early chronic microangiopathy. Electronically Signed   By: Pedro Earls M.D.   On: 05/27/2021 13:10   US RENAL  Result Date: 05/27/2021 CLINICAL DATA:  Acute kidney injury EXAM: RENAL / URINARY TRACT ULTRASOUND COMPLETE COMPARISON:  None. FINDINGS: Right Kidney: Renal measurements: 10.5 x 4.4 x 3.7 cm = volume: 90.5 mL. Echogenicity within normal limits. No mass or hydronephrosis  visualized. Left Kidney: Renal measurements: 10.3 x 4.0 x 4.7 cm = volume: 100 mL. Echogenicity within normal limits. No mass or hydronephrosis visualized. Bladder: Appears normal for degree of bladder distention. Other: None. IMPRESSION: Negative renal ultrasound.  No hydronephrosis. Electronically Signed   By: Julian Hy M.D.   On: 05/27/2021 19:13     Medications:     (feeding supplement) PROSource Plus  30 mL Oral TID BM    Chlorhexidine Gluconate Cloth  6 each Topical Daily   furosemide  80 mg Oral BID   heparin lock flush  500 Units Intracatheter Q30 days   insulin aspart  0-9 Units Subcutaneous TID WC   lactulose  30 g Oral TID   multivitamin with minerals  1 tablet Oral Daily   pantoprazole  40 mg Oral BID   rifaximin  550 mg Oral BID   spironolactone  100 mg Oral Daily   acetaminophen, albuterol, benzonatate, guaiFENesin-dextromethorphan, heparin lock flush **AND** heparin lock flush, ondansetron **OR** ondansetron (ZOFRAN) IV  Assessment/ Plan:  Crystal [name] is a 70 y.o.  female with past medical conditions including anemia, CHF, cirrhosis, diabetes, GI bleed, and CKD4. Patient has been admitted to the hospital for Morbid obesity ([place]) [E66.01] Fracture of femur, distal, right, closed ([place]) [S72.401A] Cirrhosis of liver without ascites, unspecified hepatic cirrhosis type ([place]) [K74.60] Closed nondisplaced supracondylar fracture of distal end of right femur with intracondylar extension, initial encounter ([place]) [S72.464A] Congestive heart failure, unspecified HF chronicity, unspecified heart failure type ([place]) [I50.9]         1)Renal    AKI secondary to ATN AKI secondary to IV contrast IV contrast exposure on 05/20/21 during cardiac cath No acute need for dialysis at this time Patient has AKI on CKD Patient has CKD stage IV  CKD secondary to diabetes mellitus   2)HTN   Blood pressure is stable    3)Anemia of chronic disease  CBC Latest Ref Rng & Units 05/27/2021 05/25/2021 05/23/2021  WBC 4.0 - 10.5 K/uL 10.8(H) 8.4 7.2  Hemoglobin 12.0 - 15.0 g/dL 10.1(L) 9.7(L) 9.8(L)  Hematocrit 36.0 - 46.0 % 30.9(L) 29.3(L) 29.9(L)  Platelets 150 - 400 K/uL 135(L) 120(L) 142(L)       HGb at goal (9--11)   4) Secondary hyperparathyroidism -CKD Mineral-Bone Disorder    Lab Results  Component Value Date   CALCIUM 8.7 (L) 05/28/2021   CAION 1.00 (L) 03/07/2021   PHOS 3.4 04/07/2021     Secondary Hyperparathyroidism absent.  Phosphorus at goal.   5) diabetes mellitus type 2 Being closely followed the primary team   6) Electrolytes   BMP Latest Ref Rng & Units 05/28/2021 05/27/2021 05/26/2021  Glucose 70 - 99 mg/dL 139(H) 124(H) 137(H)  BUN 8 - 23 mg/dL 52(H) 49(H) 44(H)  Creatinine 0.44 - 1.00 mg/dL 2.28(H) 2.25(H) 2.17(H)  BUN/Creat Ratio 6 - 22 (calc) - - -  Sodium 135 - 145 mmol/L 129(L) 131(L) 128(L)  Potassium 3.5 - 5.1 mmol/L 4.0 4.4 3.9  Chloride 98 - 111 mmol/L 92(L) 94(L) 90(L)  CO2 22 - 32 mmol/L _0 Calcium 8.9 - 10.3 mg/dL 8.7(L) 8.6(L) 8.6(L)     Sodium Hyponatremia Improving Patient had a sodium of 124 on July 13 respiratory at her baseline of 129--131.  Potassium Normokalemic    7)Acid base  Co2 at goal  8)DVT Patient now on anticoagulation    Plan   We will continue current care    LOS: [place]  7/16/20221:56 PM

## 2021-05-29 DIAGNOSIS — G928 Other toxic encephalopathy: Secondary | ICD-10-CM

## 2021-05-29 LAB — COMPREHENSIVE METABOLIC PANEL
ALT: 9 U/L (ref 0–44)
AST: 25 U/L (ref 15–41)
Albumin: 1.9 g/dL — ABNORMAL LOW (ref 3.5–5.0)
Alkaline Phosphatase: 131 U/L — ABNORMAL HIGH (ref 38–126)
Anion gap: 6 (ref 5–15)
BUN: 58 mg/dL — ABNORMAL HIGH (ref 8–23)
CO2: 28 mmol/L (ref 22–32)
Calcium: 8.4 mg/dL — ABNORMAL LOW (ref 8.9–10.3)
Chloride: 97 mmol/L — ABNORMAL LOW (ref 98–111)
Creatinine, Ser: 2.23 mg/dL — ABNORMAL HIGH (ref 0.44–1.00)
GFR, Estimated: 23 mL/min — ABNORMAL LOW (ref >60)
Glucose, Bld: 163 mg/dL — ABNORMAL HIGH (ref 70–99)
Potassium: 3.8 mmol/L (ref 3.5–5.1)
Sodium: 131 mmol/L — ABNORMAL LOW (ref 135–145)
Total Bilirubin: 1.6 mg/dL — ABNORMAL HIGH (ref 0.3–1.2)
Total Protein: 5.8 g/dL — ABNORMAL LOW (ref 6.5–8.1)

## 2021-05-29 LAB — CBC
HCT: 32 % — ABNORMAL LOW (ref 36.0–46.0)
Hemoglobin: 10.2 g/dL — ABNORMAL LOW (ref 12.0–15.0)
MCH: 26.8 pg (ref 26.0–34.0)
MCHC: 31.9 g/dL (ref 30.0–36.0)
MCV: 84 fL (ref 80.0–100.0)
Platelets: 131 10*3/uL — ABNORMAL LOW (ref 150–400)
RBC: 3.81 MIL/uL — ABNORMAL LOW (ref 3.87–5.11)
RDW: 19 % — ABNORMAL HIGH (ref 11.5–15.5)
WBC: 8.3 10*3/uL (ref 4.0–10.5)
nRBC: 0 % (ref 0.0–0.2)

## 2021-05-29 LAB — MRSA NEXT GEN BY PCR, NASAL: MRSA by PCR Next Gen: NOT DETECTED

## 2021-05-29 LAB — GLUCOSE, CAPILLARY
Glucose-Capillary: 104 mg/dL — ABNORMAL HIGH (ref 70–99)
Glucose-Capillary: 109 mg/dL — ABNORMAL HIGH (ref 70–99)
Glucose-Capillary: 120 mg/dL — ABNORMAL HIGH (ref 70–99)
Glucose-Capillary: 146 mg/dL — ABNORMAL HIGH (ref 70–99)
Glucose-Capillary: 156 mg/dL — ABNORMAL HIGH (ref 70–99)
Glucose-Capillary: 158 mg/dL — ABNORMAL HIGH (ref 70–99)
Glucose-Capillary: 166 mg/dL — ABNORMAL HIGH (ref 70–99)

## 2021-05-29 LAB — T4, FREE: Free T4: 1.29 ng/dL — ABNORMAL HIGH (ref 0.61–1.12)

## 2021-05-29 LAB — AMMONIA: Ammonia: 16 umol/L (ref 9–35)

## 2021-05-29 MED ORDER — LACTULOSE ENEMA
300.0000 mL | ORAL | Status: DC | PRN
  Filled 2021-05-29: qty 300

## 2021-05-29 MED ORDER — CHLORHEXIDINE GLUCONATE CLOTH 2 % EX PADS
6.0000 | MEDICATED_PAD | Freq: Every day | CUTANEOUS | Status: DC
  Administered 2021-05-30 – 2021-06-03 (×5): 6 via TOPICAL

## 2021-05-29 MED ORDER — POLYVINYL ALCOHOL 1.4 % OP SOLN
1.0000 [drp] | OPHTHALMIC | Status: DC | PRN
  Administered 2021-05-29 – 2021-05-31 (×2): 1 [drp] via OPHTHALMIC
  Filled 2021-05-29 (×2): qty 15

## 2021-05-29 MED ORDER — SODIUM CHLORIDE 0.9 % IV SOLN
1.0000 g | INTRAVENOUS | Status: DC
  Administered 2021-05-29 – 2021-06-01 (×4): 1 g via INTRAVENOUS
  Filled 2021-05-29 (×4): qty 1

## 2021-05-29 NOTE — Progress Notes (Signed)
Physical Therapy Treatment Patient Details Name: Crystal [name] MRN: [id number] DOB: 1951-04-19 Today's Date: 05/29/2021    History of Present Illness Pt is a 70 y.o. 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 CT knee showed nondisplaced fracture distal right femur with suprapatellar hemarthrosis.  Per orthopedic consult, pt to be NWB on RLE due to conservative management of fracture.    PT Comments    Pt lethargic this am.  On first attempt pt fell back asleep after saying Hi, returned later and she is able to remain awake with constant verbal and tactile stim.  She reports pain 10/10 but seems comfortable at rest drifting back to sleep if not engaged and does not seem to be in any increased pain with movement.  She does put good effort into exercises today "Let me try it myself."  She refuses attempts at EOB this am and when approached need to get OOB she again declines.  Educated on lift options Harrel Lemon style due to NWB status) and she declined today despite encouragement.  She is encouraged to try tomorrow and told pt that would be the expectation. She agreed.    Discussed with MD.  OK for continued ex and mobility despite new DVT as pt has filter placed previously.    Will plan for OOB with [facility] tomorrow.   Follow Up Recommendations  SNF     Equipment Recommendations       Recommendations for Other Services       Precautions / Restrictions Precautions Precautions: Fall Required Braces or Orthoses: Knee Immobilizer - Right Restrictions Weight Bearing Restrictions: Yes RLE Weight Bearing: Non weight bearing    Mobility  Bed Mobility               General bed mobility comments: refuses attempts    Transfers                    Ambulation/Gait                  Stairs             Wheelchair Mobility    Modified Rankin (Stroke Patients Only)       Balance                                            Cognition Arousal/Alertness: Lethargic Behavior During Therapy: WFL for tasks assessed/performed Overall Cognitive Status: Within Functional Limits for tasks assessed                                        Exercises Other Exercises Other Exercises: BLE ex x 10 for available ranges.  Limited R LE due to KI    General Comments        Pertinent Vitals/Pain Pain Assessment: 0-10 Pain Score: 10-Worst pain ever Pain Location: reports pain 10/10 but seems generally comfortable at rest and with ex's with no facial grimmacing or signs of discomfort Pain Intervention(s): Limited activity within patient's tolerance;Monitored during session;Repositioned    Home Living  Prior Function            PT Goals (current goals can now be found in the care plan section) Progress towards PT goals: Progressing toward goals    Frequency    7X/week      PT Plan Current plan remains appropriate    Co-evaluation              AM-PAC PT "6 Clicks" Mobility   Outcome Measure  Help needed turning from your back to your side while in a flat bed without using bedrails?: Total Help needed moving from lying on your back to sitting on the side of a flat bed without using bedrails?: Total Help needed moving to and from a bed to a chair (including a wheelchair)?: Total Help needed standing up from a chair using your arms (e.g., wheelchair or bedside chair)?: Total Help needed to walk in hospital room?: Total Help needed climbing 3-5 steps with a railing? : Total 6 Click Score: 6    End of Session   Activity Tolerance: Patient limited by lethargy;Patient tolerated treatment well Patient left: in bed;with call bell/phone within reach;with bed alarm set Nurse Communication:  Mobility status PT Visit Diagnosis: Unsteadiness on feet (R26.81);Other abnormalities of gait and mobility (R26.89);Pain;Difficulty in walking, not elsewhere classified (R26.2);Muscle weakness (generalized) (M62.81) Pain - Right/Left: Right Pain - part of body: Knee;Leg     Time: 1610-9604 PT Time Calculation (min) (ACUTE ONLY): 15 min  Charges:  $Therapeutic Exercise: 8-22 mins                    Chesley Noon, PTA 05/29/21, 9:52 AM , 9:47 AM

## 2021-05-29 NOTE — Progress Notes (Signed)
[facility]  ROUNDING NOTE   Subjective:   Crystal [name] is a 70 y.o. female with past medical conditions including anemia, CHF, cirrhosis, diabetes, GI bleed, and CKD4. Patient has been admitted to the hospital for Morbid obesity ([place]) [E66.01] Fracture of femur, distal, right, closed ([place]) [S72.401A] Cirrhosis of liver without ascites, unspecified hepatic cirrhosis type ([place]) [K74.60] Closed nondisplaced supracondylar fracture of distal end of right femur with intracondylar extension, initial encounter ([place]) [S72.464A] Congestive heart failure, unspecified HF chronicity, unspecified heart failure type [facility]) [I50.9]   Nephrology was consulted to evaluate an acute kidney injury.  She is a current patient of [facility], Dr Holley Raring. Patient resting in bed.  Patient offers no new specific physical complaints   Objective:  Vital signs in last 24 hours:  Temp:  [97.9 F (36.6 C)-99.2 F (37.3 C)] 97.9 F (36.6 C) (07/17 0816) Pulse Rate:  [87-127] 87 (07/17 0816) Resp:  [17-20] 18 (07/17 0816) BP: (126-140)/(61-78) 126/66 (07/17 0816) SpO2:  [93 %-95 %] 93 % (07/17 0816)  Weight change:  Filed Weights   05/23/21 1631  Weight: 98.8 kg    Intake/Output: I/O last 3 completed shifts: In: 60 [P.O.:60] Out: 2600 [Urine:2600]   Intake/Output this shift:  No intake/output data recorded.  Physical Exam: General: NAD, laying in bed  Head: Normocephalic, atraumatic. Moist oral mucosal membranes  Eyes: Anicteric  Lungs:  Clear to auscultation, normal breathing  Heart: Regular rate and rhythm  Abdomen:  Soft, nontender  Extremities:  trace peripheral edema.  Neurologic: Nonfocal, moving all four extremities  Skin: No lesions       Basic Metabolic Panel: Recent Labs  Lab 05/25/21 0443 05/26/21 0446 05/27/21 0758 05/28/21 0933 05/29/21 0431  NA 124* 128* 131* 129* 131*  K 3.5 3.9 4.4 4.0 3.8  CL 85* 90* 94* 92* 97*  CO2 _0 GLUCOSE 163* 137* 124* 139*  163*  BUN 36* 44* 49* 52* 58*  CREATININE 1.85* 2.17* 2.25* 2.28* 2.23*  CALCIUM 8.7* 8.6* 8.6* 8.7* 8.4*  MG 1.8  --   --   --   --        Liver Function Tests: Recent Labs  Lab 05/26/21 0446 05/29/21 0431  AST 25 25  ALT 11 9  ALKPHOS 134* 131*  BILITOT 1.9* 1.6*  PROT 5.7* 5.8*  ALBUMIN 2.0* 1.9*   No results for input(s): LIPASE, AMYLASE in the last 168 hours. Recent Labs  Lab 05/27/21 0758 05/28/21 1456 05/29/21 0431  AMMONIA 34 20 16    CBC: Recent Labs  Lab 05/23/21 2240 05/25/21 0443 05/27/21 0758 05/29/21 0431  WBC 7.2 8.4 10.8* 8.3  NEUTROABS 5.8  --  8.4*  --   HGB 9.8* 9.7* 10.1* 10.2*  HCT 29.9* 29.3* 30.9* 32.0*  MCV 83.3 80.9 83.7 84.0  PLT 142* 120* 135* 131*    Cardiac Enzymes: No results for input(s): CKTOTAL, CKMB, CKMBINDEX, TROPONINI in the last 168 hours.  BNP: Invalid input(s): POCBNP  CBG: Recent Labs  Lab 05/28/21 1620 05/28/21 2037 05/29/21 0008 05/29/21 0456 05/29/21 0818  GLUCAP 157* 205* 166* 158* 156*    Microbiology: Results for orders placed or performed during the hospital encounter of 05/23/21  Resp Panel by RT-PCR (Flu A&B, Covid) Nasopharyngeal Swab     Status: None   Collection Time: 05/23/21 10:50 PM   Specimen: Nasopharyngeal Swab; Nasopharyngeal(NP) swabs in vial transport medium  Result Value Ref Range Status   SARS Coronavirus 2 by  RT PCR NEGATIVE NEGATIVE Final    Comment: (NOTE) SARS-CoV-2 target nucleic acids are NOT DETECTED.  The SARS-CoV-2 RNA is generally detectable in upper respiratory specimens during the acute phase of infection. The lowest concentration of SARS-CoV-2 viral copies this assay can detect is 138 copies/mL. A negative result does not preclude SARS-Cov-2 infection and should not be used as the sole basis for treatment or other patient management decisions. A negative result may occur with  improper specimen collection/handling, submission of specimen other than  nasopharyngeal swab, presence of viral mutation(s) within the areas targeted by this assay, and inadequate number of viral copies(<138 copies/mL). A negative result must be combined with clinical observations, patient history, and epidemiological information. The expected result is Negative.  Fact Sheet for Patients:  EntrepreneurPulse.com.au  Fact Sheet for Healthcare Providers:  IncredibleEmployment.be  This test is no t yet approved or cleared by the [place] FDA and  has been authorized for detection and/or diagnosis of SARS-CoV-2 by FDA under an Emergency Use Authorization (EUA). This EUA will remain  in effect (meaning this test can be used) for the duration of the COVID-19 declaration under Section 564(b)(1) of the Act, 21 U.S.C.section 360bbb-3(b)(1), unless the authorization is terminated  or revoked sooner.       Influenza A by PCR NEGATIVE NEGATIVE Final   Influenza B by PCR NEGATIVE NEGATIVE Final    Comment: (NOTE) The Xpert Xpress SARS-CoV-2/FLU/RSV plus assay is intended as an aid in the diagnosis of influenza from Nasopharyngeal swab specimens and should not be used as a sole basis for treatment. Nasal washings and aspirates are unacceptable for Xpert Xpress SARS-CoV-2/FLU/RSV testing.  Fact Sheet for Patients: EntrepreneurPulse.com.au  Fact Sheet for Healthcare Providers: IncredibleEmployment.be  This test is not yet approved or cleared by the [place] FDA and has been authorized for detection and/or diagnosis of SARS-CoV-2 by FDA under an Emergency Use Authorization (EUA). This EUA will remain in effect (meaning this test can be used) for the duration of the COVID-19 declaration under Section 564(b)(1) of the Act, 21 U.S.C. section 360bbb-3(b)(1), unless the authorization is terminated or revoked.  Performed at [facility], [address] [place], [place]  [address]   Culture, blood (Routine X 2) w Reflex to ID Panel     Status: None (Preliminary result)   Collection Time: 05/28/21  6:00 PM   Specimen: BLOOD  Result Value Ref Range Status   Specimen Description BLOOD BLOOD LEFT FOREARM  Final   Special Requests   Final    BOTTLES DRAWN AEROBIC AND ANAEROBIC Blood Culture results may not be optimal due to an inadequate volume of blood received in culture bottles   Culture   Final    NO GROWTH < 24 HOURS Performed at [facility], [address] [place], [place] [address]    Report Status PENDING  Incomplete  Culture, blood (Routine X 2) w Reflex to ID Panel     Status: None (Preliminary result)   Collection Time: 05/28/21  6:00 PM   Specimen: BLOOD  Result Value Ref Range Status   Specimen Description BLOOD BLOOD LEFT HAND  Final   Special Requests   Final    BOTTLES DRAWN AEROBIC AND ANAEROBIC Blood Culture results may not be optimal due to an inadequate volume of blood received in culture bottles   Culture   Final    NO GROWTH < 24 HOURS Performed at [facility], [address] [place], [place] [address]  Report Status PENDING  Incomplete    Coagulation Studies: No results for input(s): LABPROT, INR in the last 72 hours.  Urinalysis: Recent Labs    05/28/21 1642  COLORURINE YELLOW*  LABSPEC 1.009  PHURINE 8.0  GLUCOSEU NEGATIVE  HGBUR NEGATIVE  BILIRUBINUR NEGATIVE  KETONESUR NEGATIVE  PROTEINUR NEGATIVE  NITRITE NEGATIVE  LEUKOCYTESUR LARGE*      Imaging: MR BRAIN WO CONTRAST  Result Date: 05/27/2021 CLINICAL DATA:  Mental status change, unknown cause. EXAM: MRI HEAD WITHOUT CONTRAST TECHNIQUE: Multiplanar, multiecho pulse sequences of the brain and surrounding structures were obtained without intravenous contrast. COMPARISON:  Head CT May 23, 2021. FINDINGS: Brain: No acute infarction, hemorrhage, hydrocephalus, extra-axial collection or mass lesion. A few foci of T2 hyperintensity are seen  within the white matter of the cerebral hemispheres, nonspecific. Vascular: Normal flow voids. Skull and upper cervical spine: Normal marrow signal. Sinuses/Orbits: Negative. Other: None. IMPRESSION: 1. No acute intracranial abnormality. 2. Small amount of nonspecific T2 hyperintense lesions of the white matter, may represent early chronic microangiopathy. Electronically Signed   By: Pedro Earls M.D.   On: 05/27/2021 13:10   US RENAL  Result Date: 05/27/2021 CLINICAL DATA:  Acute kidney injury EXAM: RENAL / URINARY TRACT ULTRASOUND COMPLETE COMPARISON:  None. FINDINGS: Right Kidney: Renal measurements: 10.5 x 4.4 x 3.7 cm = volume: 90.5 mL. Echogenicity within normal limits. No mass or hydronephrosis visualized. Left Kidney: Renal measurements: 10.3 x 4.0 x 4.7 cm = volume: 100 mL. Echogenicity within normal limits. No mass or hydronephrosis visualized. Bladder: Appears normal for degree of bladder distention. Other: None. IMPRESSION: Negative renal ultrasound.  No hydronephrosis. Electronically Signed   By: Julian Hy M.D.   On: 05/27/2021 19:13   US Abdomen Limited  Result Date: 05/28/2021 CLINICAL DATA:  Abdominal distension, possible ascites EXAM: LIMITED ABDOMEN ULTRASOUND FOR ASCITES TECHNIQUE: Limited ultrasound survey for ascites was performed in all four abdominal quadrants. COMPARISON:  None. FINDINGS: Ultrasound interrogation of the 4 quadrants of the abdomen demonstrates no evidence of ascites. IMPRESSION: Negative for ascites. Electronically Signed   By: Jacqulynn Cadet M.D.   On: 05/28/2021 14:40   US Venous Img Lower Bilateral (DVT)  Result Date: 05/28/2021 CLINICAL DATA:  Bilateral lower extremity edema EXAM: BILATERAL LOWER EXTREMITY VENOUS DOPPLER ULTRASOUND TECHNIQUE: Gray-scale sonography with graded compression, as well as color Doppler and duplex ultrasound were performed to evaluate the lower extremity deep venous systems from the level of the common  femoral vein and including the common femoral, femoral, profunda femoral, popliteal and calf veins including the posterior tibial, peroneal and gastrocnemius veins when visible. The superficial great saphenous vein was also interrogated. Spectral Doppler was utilized to evaluate flow at rest and with distal augmentation maneuvers in the common femoral, femoral and popliteal veins. COMPARISON:  None. FINDINGS: RIGHT LOWER EXTREMITY Common Femoral Vein: No evidence of thrombus. Normal compressibility, respiratory phasicity and response to augmentation. Saphenofemoral Junction: No evidence of thrombus. Normal compressibility and flow on color Doppler imaging. Profunda Femoral Vein: No evidence of thrombus. Normal compressibility and flow on color Doppler imaging. Femoral Vein: No evidence of thrombus. Normal compressibility, respiratory phasicity and response to augmentation. Popliteal Vein: Eccentric wall thickening in the right popliteal vein consistent with wall adherent thrombus. The vessel is partially compressible. There is evidence of color flow in the patent lumen on color Doppler imaging. Calf Veins: No evidence of thrombus. Normal compressibility and flow on color Doppler imaging. Superficial Great Saphenous Vein: No evidence of thrombus.  Normal compressibility. Venous Reflux:  None. Other Findings: Occlusive thrombus extends into the right small saphenous vein. LEFT LOWER EXTREMITY Common Femoral Vein: No acute thrombus. The vessel is patent and compressible. There may be some trace eccentric wall thickening at the saphenofemoral junction consistent with sequelae of remote and recanalized DVT. Saphenofemoral Junction: No evidence of thrombus. Normal compressibility and flow on color Doppler imaging. Profunda Femoral Vein: No evidence of thrombus. Normal compressibility and flow on color Doppler imaging. Femoral Vein: No evidence of thrombus. Normal compressibility, respiratory phasicity and response to  augmentation. Popliteal Vein: No evidence of thrombus. Normal compressibility, respiratory phasicity and response to augmentation. Calf Veins: No evidence of thrombus. Normal compressibility and flow on color Doppler imaging. Superficial Great Saphenous Vein: No evidence of thrombus. Normal compressibility. Venous Reflux:  None. Other Findings:  None. IMPRESSION: 1. Subacute and partially recanalized nonocclusive DVT in the right popliteal vein. 2. Acute occlusive superficial thrombophlebitis involving the right small saphenous vein. 3. 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 Electronically Signed   By: Jacqulynn Cadet M.D.   On: 05/28/2021 14:39   DG Chest Port 1 View  Result Date: 05/28/2021 CLINICAL DATA:  Possible sepsis37 y.o. 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 EXAM: PORTABLE CHEST 1 VIEW COMPARISON:  05/23/2021 and older exams. FINDINGS: Opacity noted at the lung bases, greater on the right, consistent with atelectasis, mildly increased from the previous study. Remainder of the lungs is clear. Cardiac silhouette normal in size. Normal mediastinal and hilar contours. No convincing pleural effusion and no pneumothorax. Stable right internal jugular Port-A-Cath. IMPRESSION: 1. Mild, right greater than left, basilar atelectasis, mildly increased from the prior study. 2. No convincing pneumonia. No evidence of pulmonary edema. No other change. Electronically Signed   By: Lajean Manes M.D.   On: 05/28/2021 17:42     Medications:    ceFEPime (MAXIPIME) IV      (feeding supplement) PROSource Plus  30 mL Oral TID BM   Chlorhexidine Gluconate Cloth  6 each Topical Daily   furosemide  80 mg Oral BID   heparin injection (subcutaneous)  5,000 Units Subcutaneous Q8H   heparin lock flush  500 Units Intracatheter Q30 days   insulin aspart  0-9 Units Subcutaneous TID WC    lactulose  30 g Oral TID   multivitamin with minerals  1 tablet Oral Daily   pantoprazole  40 mg Oral BID   rifaximin  550 mg Oral BID   spironolactone  100 mg Oral Daily   acetaminophen, albuterol, benzonatate, guaiFENesin-dextromethorphan, heparin lock flush **AND** heparin lock flush, ondansetron **OR** ondansetron (ZOFRAN) IV  Assessment/ Plan:  Ms. Crystal [name] is a 70 y.o.  female with past medical conditions including anemia, CHF, cirrhosis, diabetes, GI bleed, and CKD4. Patient has been admitted to the hospital for Morbid obesity ([place]) [E66.01] Fracture of femur, distal, right, closed ([place]) [S72.401A] Cirrhosis of liver without ascites, unspecified hepatic cirrhosis type ([place]) [K74.60] Closed nondisplaced supracondylar fracture of distal end of right femur with intracondylar extension, initial encounter ([place]) [S72.464A] Congestive heart failure, unspecified HF chronicity, unspecified heart failure type ([place]) [I50.9]         1)Renal    AKI secondary to ATN AKI secondary to IV contrast IV contrast exposure on 05/20/21 during cardiac cath No acute need for dialysis at this time Patient has AKI on CKD  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  Phosphorus at goal.   5) diabetes mellitus type 2 Being closely followed the primary team   6) Electrolytes   BMP Latest Ref Rng & Units  05/29/2021 05/28/2021 05/27/2021  Glucose 70 - 99 mg/dL 163(H) 139(H) 124(H)  BUN 8 - 23 mg/dL 58(H) 52(H) 49(H)  Creatinine 0.44 - 1.00 mg/dL 2.23(H) 2.28(H) 2.25(H)  BUN/Creat Ratio 6 - 22 (calc) - - -  Sodium 135 - 145 mmol/L 131(L) 129(L) 131(L)  Potassium 3.5 - 5.1 mmol/L 3.8 4.0 4.4  Chloride 98 - 111 mmol/L 97(L) 92(L) 94(L)  CO2 22 - 32 mmol/L _0 Calcium 8.9 - 10.3 mg/dL 8.4(L) 8.7(L) 8.6(L)     Sodium Hyponatremia Improving Patient had a sodium of 124 on July 13 respiratory at her baseline of 129--131.  Potassium Normokalemic    7)Acid base  Co2 at goal  8)DVT Patient now on anticoagulation    Plan   We will hold the patient's diuretics as patient is having poor p.o. intake     LOS: 6 Quintana Canelo s [facility] 7/17/20229:32 AM

## 2021-05-29 NOTE — Progress Notes (Signed)
Reported to Progressive care Nurse Drucie Opitz. Pt transported from room 1 A #142 to ICU room #4.

## 2021-05-29 NOTE — Progress Notes (Signed)
Pt admitted from 2A at 1745. Alert and oriented x4. Vitals stable on RA, no complaints verbalized. Discussed plan of care with patient and sister.

## 2021-05-29 NOTE — Consult Note (Signed)
Neurology Consultation  Reason for Consult: Altered mental status Referring Physician: Dr. Karleen Hampshire  CC: Altered mental status  History is obtained from: Chart review, patient's friend at bedside  HPI: Crystal [name] is a 70 y.o. female with a past medical history of nonalcoholic steatohepatitis status post TIPS procedure, diastolic CHF, diabetes, history of GI bleed, CKD 4, admitted to the hospital after mechanical fall and knee pain noted to have distal right closed femur fracture, which is being managed is also noted to have a UTI on antibiotics, has remained altered in spite of treatment of UTI and normal ammonia levels for which  neurological consultation was obtained. She has had a right heart cath done on 05/20/2021 and the AKI has been attributed to possible contrast nephropathy. An MRI of the brain has also been performedWhich shows no acute changes.    Her significant other/friend at the bedside reports that the patient gets this way when she does not get her lactulose.  He told me that the patient did not take her lactulose this morning and in the past, when she is not able to swallow or take her medications, lactulose enema works very well.  I did tell him that when I checked the labs this morning, her ammonia level was only 16 and I am not sure if confusion has any bearing to her getting her not getting lactulose.  ROS:  Unable to obtain due to altered mental status.   Past Medical History:  Diagnosis Date   Acute GI bleeding 02/09/2018   Acute upper gastrointestinal bleeding 08/03/2018   Anemia    TAKES IRON TAB   Arthritis    SHOULDER   B12 deficiency 02/18/2018   Bleeding    GI  3/19   Bronchitis    HX OF   CHF (congestive heart failure) (HCC)    Cirrhosis (HCC)    Colon cancer screening    Diabetes mellitus without complication (HCC)    TYPE 2   Fatty (change of) liver, not elsewhere classified    Full dentures    UPPER AND LOWER   Iron deficiency anemia due to chronic  blood loss 02/18/2018   Lung nodule, multiple    Pernicious anemia 02/22/2018   PUD (peptic ulcer disease)    Raynaud's syndrome    Upper GI bleeding 08/03/2018        Family History  Problem Relation Age of Onset   CAD Father    Heart attack Father 16   Cancer Maternal Uncle    Seizures Mother      Social History:   reports that she has never smoked. She has never used smokeless tobacco. She reports previous alcohol use. She reports that she does not use drugs.  Medications  Current Facility-Administered Medications:    (feeding supplement) PROSource Plus liquid 30 mL, 30 mL, Oral, TID BM, Danford, Suann Larry, MD, 30 mL at 05/28/21 2053   acetaminophen (TYLENOL) tablet 500 mg, 500 mg, Oral, Q6H PRN, Hosie Poisson, MD   albuterol (PROVENTIL) (2.5 MG/3ML) 0.083% nebulizer solution 2.5 mg, 2.5 mg, Nebulization, Q2H PRN, Danford, Suann Larry, MD   benzonatate (TESSALON) capsule 100 mg, 100 mg, Oral, QID PRN, Athena Masse, MD   ceFEPIme (MAXIPIME) 2 g in sodium chloride 0.9 % 100 mL IVPB, 2 g, Intravenous, Q24H, Dolan, Carissa E, RPH   Chlorhexidine Gluconate Cloth 2 % PADS 6 each, 6 each, Topical, Daily, Danford, Suann Larry, MD, 6 each at 05/29/21 0923   guaiFENesin-dextromethorphan (ROBITUSSIN DM)  100-10 MG/5ML syrup 5 mL, 5 mL, Oral, Q4H PRN, Danford, Suann Larry, MD   heparin injection 5,000 Units, 5,000 Units, Subcutaneous, Q8H, Hosie Poisson, MD, 5,000 Units at 05/29/21 0527   heparin lock flush 100 unit/mL, 500 Units, Intracatheter, Q30 days **AND** heparin lock flush 100 unit/mL, 500 Units, Intracatheter, PRN, Hosie Poisson, MD   insulin aspart (novoLOG) injection 0-9 Units, 0-9 Units, Subcutaneous, TID WC, Hosie Poisson, MD, 2 Units at 05/29/21 0831   lactulose (CHRONULAC) 10 GM/15ML solution 30 g, 30 g, Oral, TID, Hosie Poisson, MD, 30 g at 05/28/21 2052   multivitamin with minerals tablet 1 tablet, 1 tablet, Oral, Daily, Danford, Suann Larry, MD, 1 tablet at  05/29/21 0922   ondansetron (ZOFRAN) tablet 4 mg, 4 mg, Oral, Q6H PRN **OR** ondansetron (ZOFRAN) injection 4 mg, 4 mg, Intravenous, Q6H PRN, Athena Masse, MD, 4 mg at 05/27/21 1755   pantoprazole (PROTONIX) EC tablet 40 mg, 40 mg, Oral, BID, Athena Masse, MD, 40 mg at 05/29/21 4098   rifaximin (XIFAXAN) tablet 550 mg, 550 mg, Oral, BID, Hosie Poisson, MD, 550 mg at 05/29/21 1191   spironolactone (ALDACTONE) tablet 100 mg, 100 mg, Oral, Daily, Judd Gaudier V, MD, 100 mg at 05/29/21 4782  Facility-Administered Medications Ordered in Other Encounters:    sodium chloride flush (NS) 0.9 % injection 10 mL, 10 mL, Intravenous, PRN, Mike Gip, Melissa C, MD, 10 mL at 09/14/20 1320  Exam: Current vital signs: BP 126/66 (BP Location: Left Arm)   Pulse 87   Temp 97.9 F (36.6 C)   Resp 18   Ht 5' 2" (1.575 m)   Wt 98.8 kg   LMP  (LMP Unknown)   SpO2 93%   BMI 39.84 kg/m  Vital signs in last 24 hours: Temp:  [97.9 F (36.6 C)-99.2 F (37.3 C)] 97.9 F (36.6 C) (07/17 0816) Pulse Rate:  [87-127] 87 (07/17 0816) Resp:  [17-20] 18 (07/17 0816) BP: (126-140)/(61-78) 126/66 (07/17 0816) SpO2:  [93 %-95 %] 93 % (07/17 0816) General: Awake alert in no distress HEENT: Normocephalic/atraumatic Lungs: Clear Cardiovascular: Regular rhythm Abdomen nondistended nontender Extremities are cold with bluish discoloration of the fingertips and nails. Neurological exam Awake alert in no distress She is oriented to self, on asking her where she was, she said she was not [facility] when she is at [facility]. Upon asking her the month and date, she only give me her date of birth and not today's date. She was able to name simple objects such as thumb, cell phone, hand without a problem but upon asking her at pointing at the straw in her can, she could not name the straw. She was able to follow simple commands. Attention concentration was poor.  3 word registration and recall  was 0 and 0. Cranial nerve examination: Pupils are equal round reactive to light there is no restriction of extraocular movement but at times it felt like she was preferring her gaze to the right side, she blinks to threat from both sides-does not cooperate with formal testing consistently, face appears symmetric, tongue and palate midline. Motor examination: She is able to raise both her arms antigravity, her right leg is in a cast, she is able to lift the left leg against gravity without drift. Sensation intact to touch Coordination with no dysmetria no asterixis.  Labs I have reviewed labs in epic and the results pertinent to this consultation are: CBC    Component Value Date/Time   WBC  8.3 05/29/2021 0431   RBC 3.81 (L) 05/29/2021 0431   HGB 10.2 (L) 05/29/2021 0431   HGB 5.5 (LL) 04/26/2021 1139   HCT 32.0 (L) 05/29/2021 0431   HCT 17.6 (LL) 04/26/2021 1139   PLT 131 (L) 05/29/2021 0431   PLT 126 (L) 04/26/2021 1139   MCV 84.0 05/29/2021 0431   MCV 83 04/26/2021 1139   MCH 26.8 05/29/2021 0431   MCHC 31.9 05/29/2021 0431   RDW 19.0 (H) 05/29/2021 0431   RDW 17.3 (H) 04/26/2021 1139   LYMPHSABS 1.1 05/27/2021 0758   LYMPHSABS 1.0 01/27/2020 1025   MONOABS 1.1 (H) 05/27/2021 0758   EOSABS 0.1 05/27/2021 0758   EOSABS 0.1 01/27/2020 1025   BASOSABS 0.0 05/27/2021 0758   BASOSABS 0.0 01/27/2020 1025    CMP     Component Value Date/Time   NA 131 (L) 05/29/2021 0431   NA 135 02/01/2021 0935   K 3.8 05/29/2021 0431   CL 97 (L) 05/29/2021 0431   CO2 28 05/29/2021 0431   GLUCOSE 163 (H) 05/29/2021 0431   BUN 58 (H) 05/29/2021 0431   BUN 25 02/01/2021 0935   CREATININE 2.23 (H) 05/29/2021 0431   CREATININE 2.20 (H) 02/09/2021 0000   CALCIUM 8.4 (L) 05/29/2021 0431   PROT 5.8 (L) 05/29/2021 0431   PROT 6.4 01/27/2020 1025   ALBUMIN 1.9 (L) 05/29/2021 0431   ALBUMIN 3.1 (L) 01/27/2020 1025   AST 25 05/29/2021 0431   ALT 9 05/29/2021 0431   ALKPHOS 131 (H) 05/29/2021  0431   BILITOT 1.6 (H) 05/29/2021 0431   BILITOT 1.4 (H) 01/27/2020 1025   GFRNONAA 23 (L) 05/29/2021 0431   GFRNONAA 22 (L) 02/09/2021 0000   GFRAA 26 (L) 02/09/2021 0000  Urinalysis with large leuk esterase, greater than 50 cells, many bacteria  Lipid Panel     Component Value Date/Time   TRIG 163 (H) 08/03/2018 2152     Imaging I have reviewed the images obtained: MRI brain with no acute changes. No evidence of stroke.  Assessment: 70 year old with past medical history of nonalcoholic steatohepatitis status post this procedure, diastolic CHF, diabetes, history of GI bleed, CKD 4 admitted after mechanical fall noted to have distal right femur fracture that is being managed, also noted to have be having a UTI, being treated with antibiotics and continues to be altered in terms of her mentation. On examination she has poor attention concentration and some confusion. MRI brain is negative for acute stroke. Labs reveal normal ammonia levels but deranged liver and kidney function. Of note her creatinine definitely is worse than baseline-nephrology following. I suspect her current confusion is multifactorial toxic metabolic encephalopathy. She is also on cefepime which can sometimes cause neurotoxicity especially with renal derangement and liver derangement. I would anticipate some improvement as she gets more doses of her antibiotics and her UTI resolves and as her kidney function improves.   Impression: Multifactorial toxic metabolic encephalopathy in a patient with hepatic disease now with a UTI. Consider cefepime toxicity as contributing etiology to the encephalopathy.  Recommendations: I would recommend using another alternative to cefepime at this time. I would check a routine EEG tomorrow. Continue treatment of the UTI and AKI per primary team-I would expect improvement as these parameters improved.  Plan discussed with Dr. Karleen Hampshire  We will follow.  -- Amie Portland,  MD Neurologist [facility] Pager: [phone]

## 2021-05-29 NOTE — Progress Notes (Addendum)
PROGRESS NOTE    Crystal [name]  JHE:[id number] DOB: 01-22-1951 DOA: 05/23/2021 PCP: Ileene Hutchinson, PA    Chief Complaint  Patient presents with   Fall    Brief Narrative:  [name] is a 70 y.o. 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   Patient usually ambulates with a walker, but was using her cane and tripped over her shoe.  No loss of consciousness, no preceding chest pain, palpitations, dizziness.     In the ER, CT showed nondisplaced femur fracture, distal right femur with suprapatellar hemarthrosis.  Orthopedics were consulted and the hospitalist service were asked to evaluate. Pt seen and examined at bedside, more conversant that yesterday.   Assessment & Plan:   Principal Problem:   Fracture of femur, distal, right, closed ([place]) Active Problems:   S/P TIPS (transjugular intrahepatic portosystemic shunt)   Coronary artery disease involving native coronary artery of native heart without angina pectoris   Liver cirrhosis secondary to NASH (HCC)   Chronic diastolic CHF (congestive heart failure) (HCC)   Diabetes mellitus, type 2 (HCC)   Chronic kidney disease, stage IV (severe) (HCC)   Closed fracture of distal end of right femur ([place])   Accidental fall   Mechanical fall leading to distal femur fracture on the right -Orthopedics consulted recommended nonsurgical management at this time. Therapy evaluations recommending SNF.  TOC on board, palliative care consulted for goals of care, after discussing with family they wanted full scope of treatment.  Pain control.    Urinary tract infection;  Started empirically on IV antibiotics, follow urine and blood cultures and transition to oral antibiotics as appropriate.   Acute hepatic encephalopathy and Acute metabolic encephalopathy:  Initially Improved with lactulose enemas. Ammonia levels normalized.  MRI brain without contrast is negative  for acute stroke.  Avoid narcotic pain medications as she remained lethargic for atleast 24 hours from one dose of oxycodone.  She was also found to have an UTI , urine and blood cultures are pending.  She was empirically started on IV antibiotics.  Therapy evaluations recommending SNF.     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 She continues to have waxing and waning of mental status over the last 2 to 3 days. No signs of infection so far.  Will check US abdomen to see if she has ascites.  Rifaximin added to her regimen Continue with lasix and spironolactone.    Acute on Stage IV CKD Slight worsening of creatinine from 1.8 to 2.2 and plateaued at 2.2. BUN increased from 28 to 58. IV contrast exposure earlier this month on 05/20/21 for cardiac cath.  Nephrology consulted for recommendations.     Chronic diastolic heart failure Appears compensated at this time Patient currently denies any shortness of breath or chest pain but lower extremity edema present. Resume lasix and spironolactone .    Hypokalemia  replaced.    Chronic hyponatremia.  Suspect a combination of cirrhosis and diuretic use. serum osmolarity is 277 and urine osmolarity and urine sodium are pending. . Sodium is 131 today.  Continue to monitor.    Anemia of chronic disease No bleeding evident. Hemoglobin around 10.  Transfuse to keep hemoglobin greater than 7.    Thrombocytopenia Probably secondary to cirrhosis, no bleeding .  Continue to monitor.   Diabetes mellitus, non insulin dependent.  CBG (last  3)  Recent Labs    05/29/21 0008 05/29/21 0456 05/29/21 0818  GLUCAP 166* 158* 156*    Continue with sliding scale insulin . No changes in med.  Hemoglobin A1c is 5.6.   Bilateral lower extremity edema;  Venous duplex of the lower extremity show non occlusive DVT in the popliteal vein, unable to anti coagulate fully due to h/o of  GI bleed requiring hospitalization and 3 units of prbc transfusion earlier this year. She has an IVC Filter in place.  Dicussed the findings with the patient and in agreement with the plan.     DVT prophylaxis: Heparin sq. Code Status: FULL CODE Family Communication: none at bedside.  Disposition:   Status is: Inpatient  Remains inpatient appropriate because:Ongoing diagnostic testing needed not appropriate for outpatient work up, Unsafe d/c plan, and IV treatments appropriate due to intensity of illness or inability to take PO  Dispo: The patient is from: Home              Anticipated d/c is to: SNF              Patient currently is not medically stable to d/c.   Difficult to place patient No       Consultants:  Orthopedics   Procedures: none.   Antimicrobials: ( Antibiotics Given (last 72 hours)     Date/Time Action Medication Dose Rate   05/26/21 2134 Given   rifaximin (XIFAXAN) tablet 550 mg 550 mg    05/27/21 1033 Given   rifaximin (XIFAXAN) tablet 550 mg 550 mg    05/27/21 2118 Given   rifaximin (XIFAXAN) tablet 550 mg 550 mg    05/28/21 0810 Given   rifaximin (XIFAXAN) tablet 550 mg 550 mg    05/28/21 1831 New Bag/Given   ceFEPIme (MAXIPIME) 2 g in sodium chloride 0.9 % 100 mL IVPB 2 g 200 mL/hr   05/28/21 2053 Given   rifaximin (XIFAXAN) tablet 550 mg 550 mg    05/29/21 0922 Given   rifaximin (XIFAXAN) tablet 550 mg 550 mg          Subjective: No new complaints this morning. More alert and answering questions.   Objective: Vitals:   05/28/21 2002 05/28/21 2305 05/29/21 0451 05/29/21 0816  BP: 140/67 128/61 131/61 126/66  Pulse: (!) 103 98 92 87  Resp: _0 Temp: 99.2 F (37.3 C) 98.7 F (37.1 C) 98.6 F (37 C) 97.9 F (36.6 C)  TempSrc:  Oral Oral   SpO2: 95% 94% 94% 93%  Weight:      Height:        Intake/Output Summary (Last 24 hours) at 05/29/2021 1012 Last data filed at 05/29/2021 0615 Gross per 24 hour  Intake 60 ml   Output 1450 ml  Net -1390 ml    Filed Weights   05/23/21 1631  Weight: 98.8 kg    Examination:  General exam: elderly woman , not in distress.  Respiratory system: Diminished air entry at bases, no wheezing heard.  Cardiovascular system: S1S2, RRR, no JVD, pedal edema present.  Gastrointestinal system: Abdomen is soft, non tender non distended bowel sounds wnl.  Central nervous system: able to tell me her name, oriented to place and person,refusing to participate with PT. Extremities: Bilateral lower extremity edema.  Skin: No rashes seen.  Psychiatry: flat affect.     Data Reviewed: I have personally reviewed following labs and imaging studies  CBC: Recent Labs  Lab 05/23/21 2240 05/25/21 0443 05/27/21  1610 05/29/21 0431  WBC 7.2 8.4 10.8* 8.3  NEUTROABS 5.8  --  8.4*  --   HGB 9.8* 9.7* 10.1* 10.2*  HCT 29.9* 29.3* 30.9* 32.0*  MCV 83.3 80.9 83.7 84.0  PLT 142* 120* 135* 131*     Basic Metabolic Panel: Recent Labs  Lab 05/25/21 0443 05/26/21 0446 05/27/21 0758 05/28/21 0933 05/29/21 0431  NA 124* 128* 131* 129* 131*  K 3.5 3.9 4.4 4.0 3.8  CL 85* 90* 94* 92* 97*  CO2 _0 GLUCOSE 163* 137* 124* 139* 163*  BUN 36* 44* 49* 52* 58*  CREATININE 1.85* 2.17* 2.25* 2.28* 2.23*  CALCIUM 8.7* 8.6* 8.6* 8.7* 8.4*  MG 1.8  --   --   --   --      GFR: Estimated Creatinine Clearance: 26.2 mL/min (A) (by C-G formula based on SCr of 2.23 mg/dL (H)).  Liver Function Tests: Recent Labs  Lab 05/26/21 0446 05/29/21 0431  AST 25 25  ALT 11 9  ALKPHOS 134* 131*  BILITOT 1.9* 1.6*  PROT 5.7* 5.8*  ALBUMIN 2.0* 1.9*     CBG: Recent Labs  Lab 05/28/21 1620 05/28/21 2037 05/29/21 0008 05/29/21 0456 05/29/21 0818  GLUCAP 157* 205* 166* 158* 156*      Recent Results (from the past 240 hour(s))  Resp Panel by RT-PCR (Flu A&B, Covid) Nasopharyngeal Swab     Status: None   Collection Time: 05/23/21 10:50 PM   Specimen: Nasopharyngeal Swab;  Nasopharyngeal(NP) swabs in vial transport medium  Result Value Ref Range Status   SARS Coronavirus 2 by RT PCR NEGATIVE NEGATIVE Final    Comment: (NOTE) SARS-CoV-2 target nucleic acids are NOT DETECTED.  The SARS-CoV-2 RNA is generally detectable in upper respiratory specimens during the acute phase of infection. The lowest concentration of SARS-CoV-2 viral copies this assay can detect is 138 copies/mL. A negative result does not preclude SARS-Cov-2 infection and should not be used as the sole basis for treatment or other patient management decisions. A negative result may occur with  improper specimen collection/handling, submission of specimen other than nasopharyngeal swab, presence of viral mutation(s) within the areas targeted by this assay, and inadequate number of viral copies(<138 copies/mL). A negative result must be combined with clinical observations, patient history, and epidemiological information. The expected result is Negative.  Fact Sheet for Patients:  EntrepreneurPulse.com.au  Fact Sheet for Healthcare Providers:  IncredibleEmployment.be  This test is no t yet approved or cleared by the [place] FDA and  has been authorized for detection and/or diagnosis of SARS-CoV-2 by FDA under an Emergency Use Authorization (EUA). This EUA will remain  in effect (meaning this test can be used) for the duration of the COVID-19 declaration under Section 564(b)(1) of the Act, 21 U.S.C.section 360bbb-3(b)(1), unless the authorization is terminated  or revoked sooner.       Influenza A by PCR NEGATIVE NEGATIVE Final   Influenza B by PCR NEGATIVE NEGATIVE Final    Comment: (NOTE) The Xpert Xpress SARS-CoV-2/FLU/RSV plus assay is intended as an aid in the diagnosis of influenza from Nasopharyngeal swab specimens and should not be used as a sole basis for treatment. Nasal washings and aspirates are unacceptable for Xpert Xpress  SARS-CoV-2/FLU/RSV testing.  Fact Sheet for Patients: EntrepreneurPulse.com.au  Fact Sheet for Healthcare Providers: IncredibleEmployment.be  This test is not yet approved or cleared by the [place] FDA and has been authorized for detection and/or diagnosis of SARS-CoV-2 by FDA under  an Emergency Use Authorization (EUA). This EUA will remain in effect (meaning this test can be used) for the duration of the COVID-19 declaration under Section 564(b)(1) of the Act, 21 U.S.C. section 360bbb-3(b)(1), unless the authorization is terminated or revoked.  Performed at [facility], [address] [place], [place] [address]   Culture, blood (Routine X 2) w Reflex to ID Panel     Status: None (Preliminary result)   Collection Time: 05/28/21  6:00 PM   Specimen: BLOOD  Result Value Ref Range Status   Specimen Description BLOOD BLOOD LEFT FOREARM  Final   Special Requests   Final    BOTTLES DRAWN AEROBIC AND ANAEROBIC Blood Culture results may not be optimal due to an inadequate volume of blood received in culture bottles   Culture   Final    NO GROWTH < 24 HOURS Performed at [facility], [address], [place], [place] [address]    Report Status PENDING  Incomplete  Culture, blood (Routine X 2) w Reflex to ID Panel     Status: None (Preliminary result)   Collection Time: 05/28/21  6:00 PM   Specimen: BLOOD  Result Value Ref Range Status   Specimen Description BLOOD BLOOD LEFT HAND  Final   Special Requests   Final    BOTTLES DRAWN AEROBIC AND ANAEROBIC Blood Culture results may not be optimal due to an inadequate volume of blood received in culture bottles   Culture   Final    NO GROWTH < 24 HOURS Performed at [facility], [address] [place], [place] [address]    Report Status PENDING  Incomplete          Radiology Studies: MR BRAIN WO CONTRAST  Result Date: 05/27/2021 CLINICAL DATA:  Mental  status change, unknown cause. EXAM: MRI HEAD WITHOUT CONTRAST TECHNIQUE: Multiplanar, multiecho pulse sequences of the brain and surrounding structures were obtained without intravenous contrast. COMPARISON:  Head CT May 23, 2021. FINDINGS: Brain: No acute infarction, hemorrhage, hydrocephalus, extra-axial collection or mass lesion. A few foci of T2 hyperintensity are seen within the white matter of the cerebral hemispheres, nonspecific. Vascular: Normal flow voids. Skull and upper cervical spine: Normal marrow signal. Sinuses/Orbits: Negative. Other: None. IMPRESSION: 1. No acute intracranial abnormality. 2. Small amount of nonspecific T2 hyperintense lesions of the white matter, may represent early chronic microangiopathy. Electronically Signed   By: Pedro Earls M.D.   On: 05/27/2021 13:10   US RENAL  Result Date: 05/27/2021 CLINICAL DATA:  Acute kidney injury EXAM: RENAL / URINARY TRACT ULTRASOUND COMPLETE COMPARISON:  None. FINDINGS: Right Kidney: Renal measurements: 10.5 x 4.4 x 3.7 cm = volume: 90.5 mL. Echogenicity within normal limits. No mass or hydronephrosis visualized. Left Kidney: Renal measurements: 10.3 x 4.0 x 4.7 cm = volume: 100 mL. Echogenicity within normal limits. No mass or hydronephrosis visualized. Bladder: Appears normal for degree of bladder distention. Other: None. IMPRESSION: Negative renal ultrasound.  No hydronephrosis. Electronically Signed   By: Julian Hy M.D.   On: 05/27/2021 19:13   US Abdomen Limited  Result Date: 05/28/2021 CLINICAL DATA:  Abdominal distension, possible ascites EXAM: LIMITED ABDOMEN ULTRASOUND FOR ASCITES TECHNIQUE: Limited ultrasound survey for ascites was performed in all four abdominal quadrants. COMPARISON:  None. FINDINGS: Ultrasound interrogation of the 4 quadrants of the abdomen demonstrates no evidence of ascites. IMPRESSION: Negative for ascites. Electronically Signed   By: Jacqulynn Cadet M.D.   On: 05/28/2021 14:40    US  Venous Img Lower Bilateral (DVT)  Result Date: 05/28/2021 CLINICAL DATA:  Bilateral lower extremity edema EXAM: BILATERAL LOWER EXTREMITY VENOUS DOPPLER ULTRASOUND TECHNIQUE: Gray-scale sonography with graded compression, as well as color Doppler and duplex ultrasound were performed to evaluate the lower extremity deep venous systems from the level of the common femoral vein and including the common femoral, femoral, profunda femoral, popliteal and calf veins including the posterior tibial, peroneal and gastrocnemius veins when visible. The superficial great saphenous vein was also interrogated. Spectral Doppler was utilized to evaluate flow at rest and with distal augmentation maneuvers in the common femoral, femoral and popliteal veins. COMPARISON:  None. FINDINGS: RIGHT LOWER EXTREMITY Common Femoral Vein: No evidence of thrombus. Normal compressibility, respiratory phasicity and response to augmentation. Saphenofemoral Junction: No evidence of thrombus. Normal compressibility and flow on color Doppler imaging. Profunda Femoral Vein: No evidence of thrombus. Normal compressibility and flow on color Doppler imaging. Femoral Vein: No evidence of thrombus. Normal compressibility, respiratory phasicity and response to augmentation. Popliteal Vein: Eccentric wall thickening in the right popliteal vein consistent with wall adherent thrombus. The vessel is partially compressible. There is evidence of color flow in the patent lumen on color Doppler imaging. Calf Veins: No evidence of thrombus. Normal compressibility and flow on color Doppler imaging. Superficial Great Saphenous Vein: No evidence of thrombus. Normal compressibility. Venous Reflux:  None. Other Findings: Occlusive thrombus extends into the right small saphenous vein. LEFT LOWER EXTREMITY Common Femoral Vein: No acute thrombus. The vessel is patent and compressible. There may be some trace eccentric wall thickening at the saphenofemoral junction  consistent with sequelae of remote and recanalized DVT. Saphenofemoral Junction: No evidence of thrombus. Normal compressibility and flow on color Doppler imaging. Profunda Femoral Vein: No evidence of thrombus. Normal compressibility and flow on color Doppler imaging. Femoral Vein: No evidence of thrombus. Normal compressibility, respiratory phasicity and response to augmentation. Popliteal Vein: No evidence of thrombus. Normal compressibility, respiratory phasicity and response to augmentation. Calf Veins: No evidence of thrombus. Normal compressibility and flow on color Doppler imaging. Superficial Great Saphenous Vein: No evidence of thrombus. Normal compressibility. Venous Reflux:  None. Other Findings:  None. IMPRESSION: 1. Subacute and partially recanalized nonocclusive DVT in the right popliteal vein. 2. Acute occlusive superficial thrombophlebitis involving the right small saphenous vein. 3. 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 Electronically Signed   By: Jacqulynn Cadet M.D.   On: 05/28/2021 14:39   DG Chest Port 1 View  Result Date: 05/28/2021 CLINICAL DATA:  Possible sepsis85 y.o. 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 EXAM: PORTABLE CHEST 1 VIEW COMPARISON:  05/23/2021 and older exams. FINDINGS: Opacity noted at the lung bases, greater on the right, consistent with atelectasis, mildly increased from the previous study. Remainder of the lungs is clear. Cardiac silhouette normal in size. Normal mediastinal and hilar contours. No convincing pleural effusion and no pneumothorax. Stable right internal jugular Port-A-Cath. IMPRESSION: 1. Mild, right greater than left, basilar atelectasis, mildly increased from the prior study. 2. No convincing pneumonia. No evidence of pulmonary edema. No other change. Electronically Signed   By: Lajean Manes M.D.   On: 05/28/2021 17:42         Scheduled Meds:  (feeding supplement) PROSource Plus  30 mL Oral TID BM   Chlorhexidine Gluconate Cloth  6 each Topical Daily   furosemide  80 mg  Oral BID   heparin injection (subcutaneous)  5,000 Units Subcutaneous Q8H   heparin lock flush  500 Units Intracatheter Q30 days   insulin aspart  0-9 Units Subcutaneous TID WC   lactulose  30 g Oral TID   multivitamin with minerals  1 tablet Oral Daily   pantoprazole  40 mg Oral BID   rifaximin  550 mg Oral BID   spironolactone  100 mg Oral Daily   Continuous Infusions:  ceFEPime (MAXIPIME) IV       LOS: 6 days        Hosie Poisson, MD [facility]   To contact the attending provider between 7A-7P or the covering provider during after hours 7P-7A, please log into the web site [email or web address] and access using universal [place] password for that web site. If you do not have the password, please call the hospital operator.  05/29/2021, 10:12 AM    Addendum:  RN reports she is having trouble swallowing , has become lethargic. Will get SLP evaluation.  Ammonia levls wnl. EEG added.  Requested neurology consultation for further evaluation.    Hosie Poisson. MD

## 2021-05-30 DIAGNOSIS — L8990 Pressure ulcer of unspecified site, unspecified stage: Secondary | ICD-10-CM | POA: Insufficient documentation

## 2021-05-30 DIAGNOSIS — G9341 Metabolic encephalopathy: Secondary | ICD-10-CM

## 2021-05-30 DIAGNOSIS — R4182 Altered mental status, unspecified: Secondary | ICD-10-CM

## 2021-05-30 LAB — GLUCOSE, CAPILLARY
Glucose-Capillary: 133 mg/dL — ABNORMAL HIGH (ref 70–99)
Glucose-Capillary: 143 mg/dL — ABNORMAL HIGH (ref 70–99)
Glucose-Capillary: 135 mg/dL — ABNORMAL HIGH (ref 70–99)
Glucose-Capillary: 117 mg/dL — ABNORMAL HIGH (ref 70–99)

## 2021-05-30 LAB — URINE CULTURE: Culture: 10000 — AB

## 2021-05-30 LAB — SODIUM, URINE, RANDOM: Sodium, Ur: 78 mmol/L

## 2021-05-30 LAB — T3, FREE: T3, Free: 1.3 pg/mL — ABNORMAL LOW (ref 2.0–4.4)

## 2021-05-30 MED ORDER — MORPHINE SULFATE (PF) 2 MG/ML IV SOLN
1.0000 mg | INTRAVENOUS | Status: DC | PRN
  Administered 2021-05-31 – 2021-06-01 (×4): 1 mg via INTRAVENOUS
  Filled 2021-05-30 (×6): qty 1

## 2021-05-30 NOTE — Progress Notes (Signed)
Chaplain Maggie made initial visit with patient's daughter, Shauna Hugh, in the ICU waiting room. Her mom was engaged with a procedure at the time of the visit. Chaplain made room for storytelling and reflective listening. Continued support for family and patient available per on call chaplain.

## 2021-05-30 NOTE — Progress Notes (Signed)
[facility]  ROUNDING NOTE   Subjective:   Crystal [name] is a 70 y.o. female with past medical conditions including anemia, CHF, cirrhosis, diabetes, GI bleed, and CKD4. Patient has been admitted to the hospital for Morbid obesity ([place]) [E66.01] Fracture of femur, distal, right, closed ([place]) [S72.401A] Cirrhosis of liver without ascites, unspecified hepatic cirrhosis type ([place]) [K74.60] Closed nondisplaced supracondylar fracture of distal end of right femur with intracondylar extension, initial encounter ([place]) [S72.464A] Congestive heart failure, unspecified HF chronicity, unspecified heart failure type ([place]) [I50.9]   Lab Results  Component Value Date   CREATININE 2.23 (H) 05/29/2021   CREATININE 2.28 (H) 05/28/2021   CREATININE 2.25 (H) 05/27/2021   07/17 0701 - 07/18 0700 In: 220.2 [P.O.:120; IV Piggyback:100.2] Out: 525 [Urine:525]     Objective:  Vital signs in last 24 hours:  Temp:  [98.4 F (36.9 C)-99 F (37.2 C)] 99 F (37.2 C) (07/17 2000) Pulse Rate:  [75-88] 75 (07/18 0600) Resp:  [14-22] 16 (07/18 0600) BP: (111-134)/(52-73) 113/53 (07/18 0600) SpO2:  [93 %-100 %] 93 % (07/18 0600) Weight:  [84.3 kg] 84.3 kg (07/17 1758)  Weight change:  Filed Weights   05/23/21 1631 05/29/21 1758  Weight: 98.8 kg 84.3 kg    Intake/Output: I/O last 3 completed shifts: In: 280.2 [P.O.:180; IV Piggyback:100.2] Out: 1275 [Urine:1275]   Intake/Output this shift:  No intake/output data recorded.  Physical Exam: General: NAD, laying in bed  Head: Normocephalic, atraumatic. Moist oral mucosal membranes  Eyes: Anicteric  Lungs:  Clear to auscultation, normal breathing  Heart: Regular rate and rhythm  Abdomen:  Soft, nontender  Extremities:  trace peripheral edema.  Neurologic: Nonfocal, moving all four extremities  Skin: No lesions       Basic Metabolic Panel: Recent Labs  Lab 05/25/21 0443 05/26/21 0446 05/27/21 0758 05/28/21 0933 05/29/21 0431   NA 124* 128* 131* 129* 131*  K 3.5 3.9 4.4 4.0 3.8  CL 85* 90* 94* 92* 97*  CO2 _0 GLUCOSE 163* 137* 124* 139* 163*  BUN 36* 44* 49* 52* 58*  CREATININE 1.85* 2.17* 2.25* 2.28* 2.23*  CALCIUM 8.7* 8.6* 8.6* 8.7* 8.4*  MG 1.8  --   --   --   --      Liver Function Tests: Recent Labs  Lab 05/26/21 0446 05/29/21 0431  AST 25 25  ALT 11 9  ALKPHOS 134* 131*  BILITOT 1.9* 1.6*  PROT 5.7* 5.8*  ALBUMIN 2.0* 1.9*    No results for input(s): LIPASE, AMYLASE in the last 168 hours. Recent Labs  Lab 05/27/21 0758 05/28/21 1456 05/29/21 0431  AMMONIA 34 20 16     CBC: Recent Labs  Lab 05/23/21 2240 05/25/21 0443 05/27/21 0758 05/29/21 0431  WBC 7.2 8.4 10.8* 8.3  NEUTROABS 5.8  --  8.4*  --   HGB 9.8* 9.7* 10.1* 10.2*  HCT 29.9* 29.3* 30.9* 32.0*  MCV 83.3 80.9 83.7 84.0  PLT 142* 120* 135* 131*     Cardiac Enzymes: No results for input(s): CKTOTAL, CKMB, CKMBINDEX, TROPONINI in the last 168 hours.  BNP: Invalid input(s): POCBNP  CBG: Recent Labs  Lab 05/29/21 1144 05/29/21 1644 05/29/21 1807 05/29/21 2108 05/30/21 0748  GLUCAP 120* 146* 104* 109* 133*     Microbiology: Results for orders placed or performed during the hospital encounter of 05/23/21  Resp Panel by RT-PCR (Flu A&B, Covid) Nasopharyngeal Swab     Status: None   Collection Time:  05/23/21 10:50 PM   Specimen: Nasopharyngeal Swab; Nasopharyngeal(NP) swabs in vial transport medium  Result Value Ref Range Status   SARS Coronavirus 2 by RT PCR NEGATIVE NEGATIVE Final    Comment: (NOTE) SARS-CoV-2 target nucleic acids are NOT DETECTED.  The SARS-CoV-2 RNA is generally detectable in upper respiratory specimens during the acute phase of infection. The lowest concentration of SARS-CoV-2 viral copies this assay can detect is 138 copies/mL. A negative result does not preclude SARS-Cov-2 infection and should not be used as the sole basis for treatment or other patient management  decisions. A negative result may occur with  improper specimen collection/handling, submission of specimen other than nasopharyngeal swab, presence of viral mutation(s) within the areas targeted by this assay, and inadequate number of viral copies(<138 copies/mL). A negative result must be combined with clinical observations, patient history, and epidemiological information. The expected result is Negative.  Fact Sheet for Patients:  EntrepreneurPulse.com.au  Fact Sheet for Healthcare Providers:  IncredibleEmployment.be  This test is no t yet approved or cleared by the [place] FDA and  has been authorized for detection and/or diagnosis of SARS-CoV-2 by FDA under an Emergency Use Authorization (EUA). This EUA will remain  in effect (meaning this test can be used) for the duration of the COVID-19 declaration under Section 564(b)(1) of the Act, 21 U.S.C.section 360bbb-3(b)(1), unless the authorization is terminated  or revoked sooner.       Influenza A by PCR NEGATIVE NEGATIVE Final   Influenza B by PCR NEGATIVE NEGATIVE Final    Comment: (NOTE) The Xpert Xpress SARS-CoV-2/FLU/RSV plus assay is intended as an aid in the diagnosis of influenza from Nasopharyngeal swab specimens and should not be used as a sole basis for treatment. Nasal washings and aspirates are unacceptable for Xpert Xpress SARS-CoV-2/FLU/RSV testing.  Fact Sheet for Patients: EntrepreneurPulse.com.au  Fact Sheet for Healthcare Providers: IncredibleEmployment.be  This test is not yet approved or cleared by the [place] FDA and has been authorized for detection and/or diagnosis of SARS-CoV-2 by FDA under an Emergency Use Authorization (EUA). This EUA will remain in effect (meaning this test can be used) for the duration of the COVID-19 declaration under Section 564(b)(1) of the Act, 21 U.S.C. section 360bbb-3(b)(1), unless the  authorization is terminated or revoked.  Performed at [facility], [address] [place], [place] [address]   Culture, blood (Routine X 2) w Reflex to ID Panel     Status: None (Preliminary result)   Collection Time: 05/28/21  6:00 PM   Specimen: BLOOD  Result Value Ref Range Status   Specimen Description BLOOD BLOOD LEFT FOREARM  Final   Special Requests   Final    BOTTLES DRAWN AEROBIC AND ANAEROBIC Blood Culture results may not be optimal due to an inadequate volume of blood received in culture bottles   Culture   Final    NO GROWTH 2 DAYS Performed at [facility], [address], [place], [place] [address]    Report Status PENDING  Incomplete  Culture, blood (Routine X 2) w Reflex to ID Panel     Status: None (Preliminary result)   Collection Time: 05/28/21  6:00 PM   Specimen: BLOOD  Result Value Ref Range Status   Specimen Description BLOOD BLOOD LEFT HAND  Final   Special Requests   Final    BOTTLES DRAWN AEROBIC AND ANAEROBIC Blood Culture results may not be optimal due to an inadequate volume of blood received in culture bottles  Culture   Final    NO GROWTH 2 DAYS Performed at [facility], [address] [place], [place] [address]    Report Status PENDING  Incomplete  Urine Culture     Status: Abnormal   Collection Time: 05/28/21 10:10 PM   Specimen: Urine, Random  Result Value Ref Range Status   Specimen Description   Final    URINE, RANDOM Performed at [facility], [address], [place], [place] [address]    Special Requests   Final    NONE Performed at [facility], [address] [place], [place] [address]    Culture (A)  Final    10,000 COLONIES/mL MULTIPLE SPECIES PRESENT, SUGGEST RECOLLECTION   Report Status 05/30/2021 FINAL  Final  MRSA Next Gen by PCR, Nasal     Status: None   Collection Time: 05/29/21  6:04 PM   Specimen: Nasal Mucosa; Nasal Swab  Result Value Ref Range Status   MRSA by  PCR Next Gen NOT DETECTED NOT DETECTED Final    Comment: (NOTE) The GeneXpert MRSA Assay (FDA approved for NASAL specimens only), is one component of a comprehensive MRSA colonization surveillance program. It is not intended to diagnose MRSA infection nor to guide or monitor treatment for MRSA infections. Test performance is not FDA approved in patients less than 50 years old. Performed at [facility], [address] [place],  [address]     Coagulation Studies: No results for input(s): LABPROT, INR in the last 72 hours.  Urinalysis: Recent Labs    05/28/21 1642  COLORURINE YELLOW*  LABSPEC 1.009  PHURINE 8.0  GLUCOSEU NEGATIVE  HGBUR NEGATIVE  BILIRUBINUR NEGATIVE  KETONESUR NEGATIVE  PROTEINUR NEGATIVE  NITRITE NEGATIVE  LEUKOCYTESUR LARGE*       Imaging: US Abdomen Limited  Result Date: 05/28/2021 CLINICAL DATA:  Abdominal distension, possible ascites EXAM: LIMITED ABDOMEN ULTRASOUND FOR ASCITES TECHNIQUE: Limited ultrasound survey for ascites was performed in all four abdominal quadrants. COMPARISON:  None. FINDINGS: Ultrasound interrogation of the 4 quadrants of the abdomen demonstrates no evidence of ascites. IMPRESSION: Negative for ascites. Electronically Signed   By: Jacqulynn Cadet M.D.   On: 05/28/2021 14:40   US Venous Img Lower Bilateral (DVT)  Result Date: 05/28/2021 CLINICAL DATA:  Bilateral lower extremity edema EXAM: BILATERAL LOWER EXTREMITY VENOUS DOPPLER ULTRASOUND TECHNIQUE: Gray-scale sonography with graded compression, as well as color Doppler and duplex ultrasound were performed to evaluate the lower extremity deep venous systems from the level of the common femoral vein and including the common femoral, femoral, profunda femoral, popliteal and calf veins including the posterior tibial, peroneal and gastrocnemius veins when visible. The superficial great saphenous vein was also interrogated. Spectral Doppler was utilized to evaluate  flow at rest and with distal augmentation maneuvers in the common femoral, femoral and popliteal veins. COMPARISON:  None. FINDINGS: RIGHT LOWER EXTREMITY Common Femoral Vein: No evidence of thrombus. Normal compressibility, respiratory phasicity and response to augmentation. Saphenofemoral Junction: No evidence of thrombus. Normal compressibility and flow on color Doppler imaging. Profunda Femoral Vein: No evidence of thrombus. Normal compressibility and flow on color Doppler imaging. Femoral Vein: No evidence of thrombus. Normal compressibility, respiratory phasicity and response to augmentation. Popliteal Vein: Eccentric wall thickening in the right popliteal vein consistent with wall adherent thrombus. The vessel is partially compressible. There is evidence of color flow in the patent lumen on color Doppler imaging. Calf Veins: No evidence of thrombus. Normal compressibility and flow on color Doppler  imaging. Superficial Great Saphenous Vein: No evidence of thrombus. Normal compressibility. Venous Reflux:  None. Other Findings: Occlusive thrombus extends into the right small saphenous vein. LEFT LOWER EXTREMITY Common Femoral Vein: No acute thrombus. The vessel is patent and compressible. There may be some trace eccentric wall thickening at the saphenofemoral junction consistent with sequelae of remote and recanalized DVT. Saphenofemoral Junction: No evidence of thrombus. Normal compressibility and flow on color Doppler imaging. Profunda Femoral Vein: No evidence of thrombus. Normal compressibility and flow on color Doppler imaging. Femoral Vein: No evidence of thrombus. Normal compressibility, respiratory phasicity and response to augmentation. Popliteal Vein: No evidence of thrombus. Normal compressibility, respiratory phasicity and response to augmentation. Calf Veins: No evidence of thrombus. Normal compressibility and flow on color Doppler imaging. Superficial Great Saphenous Vein: No evidence of thrombus.  Normal compressibility. Venous Reflux:  None. Other Findings:  None. IMPRESSION: 1. Subacute and partially recanalized nonocclusive DVT in the right popliteal vein. 2. Acute occlusive superficial thrombophlebitis involving the right small saphenous vein. 3. 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 Electronically Signed   By: Jacqulynn Cadet M.D.   On: 05/28/2021 14:39   DG Chest Port 1 View  Result Date: 05/28/2021 CLINICAL DATA:  Possible sepsis80 y.o. 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 EXAM: PORTABLE CHEST 1 VIEW COMPARISON:  05/23/2021 and older exams. FINDINGS: Opacity noted at the lung bases, greater on the right, consistent with atelectasis, mildly increased from the previous study. Remainder of the lungs is clear. Cardiac silhouette normal in size. Normal mediastinal and hilar contours. No convincing pleural effusion and no pneumothorax. Stable right internal jugular Port-A-Cath. IMPRESSION: 1. Mild, right greater than left, basilar atelectasis, mildly increased from the prior study. 2. No convincing pneumonia. No evidence of pulmonary edema. No other change. Electronically Signed   By: Lajean Manes M.D.   On: 05/28/2021 17:42     Medications:    cefTRIAXone (ROCEPHIN)  IV Stopped (05/29/21 1632)    (feeding supplement) PROSource Plus  30 mL Oral TID BM   Chlorhexidine Gluconate Cloth  6 each Topical Q0600   heparin injection (subcutaneous)  5,000 Units Subcutaneous Q8H   heparin lock flush  500 Units Intracatheter Q30 days   insulin aspart  0-9 Units Subcutaneous TID WC   lactulose  30 g Oral TID   multivitamin with minerals  1 tablet Oral Daily   pantoprazole  40 mg Oral BID   rifaximin  550 mg Oral BID   spironolactone  100 mg Oral Daily   acetaminophen, albuterol, benzonatate, guaiFENesin-dextromethorphan, heparin lock flush **AND** heparin lock  flush, lactulose, ondansetron **OR** ondansetron (ZOFRAN) IV, polyvinyl alcohol  Assessment/ Plan:  Ms. Crystal [name] is a 70 y.o.  female with past medical conditions including anemia, CHF, non alcoholic cirrhosis, h/o TIPS, diabetes, h/o GI bleed, and CKD4. Patient has been admitted to the hospital for Morbid obesity ([place]) [E66.01] Fracture of femur, distal, right, closed ([place]) [S72.401A] Cirrhosis of liver without ascites, unspecified hepatic cirrhosis type ([place]) [K74.60] Closed nondisplaced supracondylar fracture of distal end of right femur with intracondylar extension, initial encounter ([place]) [S72.464A] Congestive heart failure, unspecified HF chronicity, unspecified heart failure type ([place]) [I50.9]   Acute Kidney Injury on chronic kidney disease stage 4 with baseline creatinine 1.85 and GFR of 29 on 05/25/2021.  Renal ultrasound neg for hydronephrosis (05/27/21) IV contrast exposure  on 05/20/21 during cardiac cath     Lab Results  Component Value Date   CREATININE 2.23 (H) 05/29/2021   CREATININE 2.28 (H) 05/28/2021   CREATININE 2.25 (H) 05/27/2021    Intake/Output Summary (Last 24 hours) at 05/30/2021 0911 Last data filed at 05/30/2021 0400 Gross per 24 hour  Intake 220.2 ml  Output 525 ml  Net -304.8 ml    S Creatinine appears to be stabilizing Will continue to monitor   2. Anemia of chronic kidney disease  Lab Results  Component Value Date   HGB 10.2 (L) 05/29/2021   Hgb at goal   3. Chronic hyponatremia Likely due to underlying cirrhosis Na level improving (ranges 128-139 for her) May end up needing fluid restriction if levels drop   4. Admitted post mechanical fall resulting in closed non displaced right femur fracture with supra patellar hemarthrosis   LOS: 7 [place] 7/18/20229:11 AM

## 2021-05-30 NOTE — Progress Notes (Signed)
Eeg done

## 2021-05-30 NOTE — Evaluation (Signed)
Clinical/Bedside Swallow Evaluation Patient Details  Name: Crystal [name] MRN: [id number] Date of Birth: 06/19/1951  Today's Date: 05/30/2021 Time: SLP Start Time (ACUTE ONLY): 0815 SLP Stop Time (ACUTE ONLY): 8786 SLP Time Calculation (min) (ACUTE ONLY): 20 min  Past Medical History:  Past Medical History:  Diagnosis Date   Acute GI bleeding 02/09/2018   Acute upper gastrointestinal bleeding 08/03/2018   Anemia    TAKES IRON TAB   Arthritis    SHOULDER   B12 deficiency 02/18/2018   Bleeding    GI  3/19   Bronchitis    HX OF   CHF (congestive heart failure) (HCC)    Cirrhosis (HCC)    Colon cancer screening    Diabetes mellitus without complication ([place])    TYPE 2   Fatty (change of) liver, not elsewhere classified    Full dentures    UPPER AND LOWER   Iron deficiency anemia due to chronic blood loss 02/18/2018   Lung nodule, multiple    Pernicious anemia 02/22/2018   PUD (peptic ulcer disease)    Raynaud's syndrome    Upper GI bleeding 08/03/2018   Past Surgical History:  Past Surgical History:  Procedure Laterality Date   CATARACT EXTRACTION W/PHACO Right 02/06/2018   Procedure: CATARACT EXTRACTION PHACO AND INTRAOCULAR LENS PLACEMENT ([place]) RIGHT DIABETIC;  Surgeon: Leandrew Koyanagi, MD;  Location: [place];  Service: Ophthalmology;  Laterality: Right;  DIABETIC, ORAL MED   CATARACT EXTRACTION W/PHACO Left 04/30/2018   Procedure: CATARACT EXTRACTION PHACO AND INTRAOCULAR LENS PLACEMENT (I[facility]);  Surgeon: Leandrew Koyanagi, MD;  Location: [facility];  Service: Ophthalmology;  Laterality: Left;  [place] 01:04 AP% 21.3 CDE 13.66 Fluid pack lot # [id number]   COLONOSCOPY WITH PROPOFOL N/A 03/25/2018   Procedure: COLONOSCOPY WITH PROPOFOL;  Surgeon: Lin Landsman, MD;  Location: [facility] END[facility]COPY;  Service: Gastroenterology;  Laterality: N/A;   DENTAL SURGERY     EXTRACTIONS   ELECTROMAGNETIC NAVIGATION BROCHOSCOPY Right 01/27/2019   Procedure: ELECTROMAGNETIC NAVIGATION  BRONCHOSCOPY;  Surgeon: [name], MD;  Location: [facility];  Service: Cardiopulmonary;  Laterality: Right;   ENTEROSCOPY N/A 07/28/2020   Procedure: Push ENTEROSCOPY;  Surgeon: Lin Landsman, MD;  Location: [facility] ENDOSCOPY;  Service: Gastroenterology;  Laterality: N/A;   ESOPHAGOGASTRODUODENOSCOPY N/A 08/03/2018   Procedure: ESOPHAGOGASTRODUODENOSCOPY (EGD);  Surgeon: Lin Landsman, MD;  Location: [facility] ENDOSCOPY;  Service: Gastroenterology;  Laterality: N/A;   ESOPHAGOGASTRODUODENOSCOPY (EGD) WITH PROPOFOL N/A 02/10/2018   Procedure: ESOPHAGOGASTRODUODENOSCOPY (EGD) WITH PROPOFOL;  Surgeon: Jonathon Bellows, MD;  Location: [facility] ENDOSCOPY;  Service: Gastroenterology;  Laterality: N/A;   ESOPHAGOGASTRODUODENOSCOPY (EGD) WITH PROPOFOL N/A 03/25/2018   Procedure: ESOPHAGOGASTRODUODENOSCOPY (EGD) WITH PROPOFOL;  Surgeon: Lin Landsman, MD;  Location: [facility]COPY;  Service: Gastroenterology;  Laterality: N/A;   ESOPHAGOGASTRODUODENOSCOPY (EGD) WITH PROPOFOL N/A 04/22/2018   Procedure: ESOPHAGOGASTRODUODENOSCOPY (EGD) WITH PROPOFOL with band ligation;  Surgeon: Lin Landsman, MD;  Location: [place];  Service: Gastroenterology;  Laterality: N/A;   ESOPHAGOGASTRODUODENOSCOPY (EGD) WITH PROPOFOL N/A 06/24/2018   Procedure: ESOPHAGOGASTRODUODENOSCOPY (EGD) WITH PROPOFOL;  Surgeon: Lin Landsman, MD;  Location: [facility] ENDOSCOPY;  Service: Gastroenterology;  Laterality: N/A;   ESOPHAGOGASTRODUODENOSCOPY (EGD) WITH PROPOFOL N/A 11/19/2018   Procedure: ESOPHAGOGASTRODUODENOSCOPY (EGD) WITH PROPOFOL;  Surgeon: Lin Landsman, MD;  Location: [place];  Service: Gastroenterology;  Laterality: N/A;   ESOPHAGOGASTRODUODENOSCOPY (EGD) WITH PROPOFOL N/A 03/08/2020   Procedure: ESOPHAGOGASTRODUODENOSCOPY (EGD) WITH PROPOFOL;  Surgeon: Lin Landsman, MD;  Location: [facility] ENDOSCOPY;  Service: Gastroenterology;  Laterality: N/A;  ESOPHAGOGASTRODUODENOSCOPY (EGD) WITH PROPOFOL N/A  04/28/2021   Procedure: ESOPHAGOGASTRODUODENOSCOPY (EGD) WITH PROPOFOL;  Surgeon: Lin Landsman, MD;  Location: [place];  Service: Gastroenterology;  Laterality: N/A;   EYE SURGERY     GIVENS CAPSULE STUDY N/A 03/29/2020   Procedure: GIVENS CAPSULE STUDY;  Surgeon: Lin Landsman, MD;  Location: [facility] ENDOSCOPY;  Service: Gastroenterology;  Laterality: N/A;   HEMORRHOID BANDING  03/25/2018   Procedure: HEMORRHOID BANDING;  Surgeon: Lin Landsman, MD;  Location: [facility] ENDOSCOPY;  Service: Gastroenterology;;   IR EMBO ART  VEN HEMORR LYMPH EXTRAV  INC GUIDE ROADMAPPING  08/07/2018   IR RADIOLOGIST EVAL & MGMT  09/03/2018   IR RADIOLOGIST EVAL & MGMT  12/17/2018   IR RADIOLOGIST EVAL & MGMT  06/24/2019   IR RADIOLOGIST EVAL & MGMT  01/22/2020   IR RADIOLOGIST EVAL & MGMT  02/09/2021   IR TIPS  08/07/2018   IR TRANSHEPATIC PORTOGRAM W HEMO  03/07/2021   IR US GUIDE VASC ACCESS RIGHT  03/07/2021   IR VENO/JUGULAR LEFT  03/07/2021   IVC FILTER INSERTION N/A 04/28/2021   Procedure: IVC FILTER INSERTION;  Surgeon: Algernon Huxley, MD;  Location: [place] [facility];  Service: Cardiovascular;  Laterality: N/A;   PORTA CATH INSERTION N/A 07/30/2020   Procedure: PORTA CATH INSERTION;  Surgeon: Katha Cabal, MD;  Location: [place] [facility];  Service: Cardiovascular;  Laterality: N/A;   RADIOLOGY WITH ANESTHESIA N/A 08/07/2018   Procedure: TIPS;  Surgeon: Corrie Mckusick, DO;  Location: [place];  Service: Anesthesiology;  Laterality: N/A;   RIGHT HEART CATH N/A 05/20/2021   Procedure: RIGHT HEART CATH;  Surgeon: Nelva Bush, MD;  Location: [place] [facility];  Service: Cardiovascular;  Laterality: N/A;   TONSILLECTOMY     HPI:  Crystal [name] is a 71 y.o. 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 Pt experienced a cognitive decline with MRI negative. Chest x-ray revealed Mild, right greater than left, basilar atelectasis, mildly  increased from the prior study.  2. No convincing pneumonia. No evidence of pulmonary edema. No other  change.   Assessment / Plan / Recommendation Clinical Impression  Per pt's nurse, her cognition has improved greatly over the course of the last 24 hours. Currently pt is oriented x 4, alert, appropriate. While pt has her dentures in room, she prefers to eat without them. Pt demonstrated adequate oropharyngeal abilities when consuming her regular diet breakfast and thin liquids via straw. At this time, recommend pt continue with currently ordered diet. No further needs identified, ST will sign off. SLP Visit Diagnosis: Dysphagia, unspecified (R13.10)    Aspiration Risk  Mild aspiration risk    Diet Recommendation Regular;Thin liquid   Liquid Administration via: Straw;Cup Medication Administration: Whole meds with liquid Supervision: Patient able to self feed Compensations: Minimize environmental distractions;Slow rate;Small sips/bites Postural Changes: Seated upright at 90 degrees;Remain upright for at least 30 minutes after po intake    Other  Recommendations Oral Care Recommendations: Oral care BID   Follow up Recommendations None      Frequency and Duration            Prognosis        Swallow  Study   General Date of Onset: 05/29/21 HPI: Minah A Savitt is a 70 y.o. 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 Pt experienced a cognitive decline with MRI  negative. Chest x-ray revealed Mild, right greater than left, basilar atelectasis, mildly  increased from the prior study.  2. No convincing pneumonia. No evidence of pulmonary edema. No other  change. Type of Study: Bedside Swallow Evaluation Previous Swallow Assessment: none in chart Diet Prior to this Study: Regular;Thin liquids Temperature Spikes Noted: No Respiratory Status: Room air History of Recent Intubation: No Behavior/Cognition: Alert;Cooperative;Pleasant mood Oral Cavity Assessment: Within Functional Limits Oral Care Completed by SLP: No Oral Cavity - Dentition: Edentulous Vision: Functional for self-feeding Self-Feeding Abilities: Able to feed self Patient Positioning: Upright in bed Baseline Vocal Quality: Normal Volitional Cough: Strong Volitional Swallow: Able to elicit    Oral/Motor/Sensory Function Overall Oral Motor/Sensory Function: Within functional limits   Ice Chips Ice chips: Not tested   Thin Liquid Thin Liquid: Within functional limits Presentation: Cup;Self Fed;Straw    Nectar Thick Nectar Thick Liquid: Not tested   Honey Thick Honey Thick Liquid: Not tested   Puree Puree: Within functional limits Presentation: Self Fed;Spoon   Solid     Solid: Within functional limits Presentation: Self Fed     Eryca Bolte B. Rutherford Nail M.S., CCC-SLP, [place] Office [phone]  Berthold Glace 05/30/2021,9:58 AM

## 2021-05-30 NOTE — Procedures (Signed)
History: 70 yo F with AMS  Sedation: None  Technique: This is a 21 channel routine scalp EEG performed at the bedside with bipolar and monopolar montages arranged in accordance to the international 10/20 system of electrode placement. One channel was dedicated to EKG recording.    Background: There is a well-formed posterior dominant rhythm of 8 Hz which is reactive to eye opening/closure.  In addition there is intrusion into the background of irregular low voltage delta and theta activity even during maximal wakefulness.  With drowsiness, there is prominent frontally predominant intermittent rhythmic delta activity(FIRDA).   Photic stimulation: Physiologic driving is not performed  EEG Abnormalities: 1) FIRDA 2) Generalized irregular slow activity.   Clinical Interpretation: This EEG is consistent with a mild generalized nonspecific cerebral dysfunction (encephalopathy).  There was no seizure or seizure predisposition recorded on this study. Please note that lack of epileptiform activity on EEG does not preclude the possibility of epilepsy.   Roland Rack, MD [facility] [phone]  If 7pm- 7am, please page neurology on call as listed in [place].

## 2021-05-30 NOTE — Progress Notes (Signed)
Patient is much improved today. She is alert, oriented to person, year, month, place. She has some difficulty with world backwards. I suspect this was a metabolic encephalopathy associated with both her hepatic disease and UTI.   No further acute workup at this time, but neurology will be available for further questions as needed.   Roland Rack, MD [facility] [phone]  If 7pm- 7am, please page neurology on call as listed in [place].

## 2021-05-30 NOTE — Progress Notes (Signed)
PROGRESS NOTE    Crystal [name]  WUJ:[id number] DOB: 25-Sep-1951 DOA: 05/23/2021 PCP: Ileene Hutchinson, PA    Chief Complaint  Patient presents with   Fall    Brief Narrative:  Crystal [name] is a 70 y.o. 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   Patient usually ambulates with a walker, but was using her cane and tripped over her shoe.  No loss of consciousness, no preceding chest pain, palpitations, dizziness.     In the ER, CT showed nondisplaced femur fracture, distal right femur with suprapatellar hemarthrosis.  Orthopedics were consulted and the hospitalist service were asked to evaluate. Pt seen and examined at bedside, alert and oriented to place , person.   Assessment & Plan:   Principal Problem:   Fracture of femur, distal, right, closed ([place]) Active Problems:   S/P TIPS (transjugular intrahepatic portosystemic shunt)   Coronary artery disease involving native coronary artery of native heart without angina pectoris   Liver cirrhosis secondary to NASH (HCC)   Chronic diastolic CHF (congestive heart failure) (HCC)   Diabetes mellitus, type 2 (HCC)   Chronic kidney disease, stage IV (severe) (HCC)   Closed fracture of distal end of right femur ([place])   Accidental fall   Pressure injury of skin   Mechanical fall leading to distal femur fracture on the right -Orthopedics consulted recommended nonsurgical management at this time. Therapy evaluations recommending SNF.  TOC on board, palliative care consulted for goals of care, after discussing with family they wanted full scope of treatment.  Pain control.    Urinary tract infection;  Started empirically on IV antibiotics, follow urine and blood cultures and transition to oral antibiotics as appropriate.  Urine cultures showed 10,000 colonies, but since she is improving clinically, we will continue the antibiotics for atleast 5 days.   Acute  hepatic encephalopathy and Acute metabolic encephalopathy:  Initially Improved with lactulose enemas. Ammonia levels normalized.  MRI brain without contrast is negative for acute stroke.  Avoid narcotic pain medications as she remained lethargic for atleast 24 hours from one dose of oxycodone.  She was also found to have an UTI , blood cultures are negative.  She was empirically started on IV antibiotics, with improvement in the mental status, she is more alert and oriented to place and person, told us how she fell. She is working with PT today.   Therapy evaluations recommending SNF.     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 She continues to have waxing and waning of mental status over the last 2 to 3 days. No signs of infection so far.  US abdomen is negative for ascites.  Rifaximin added to her regimen We had to hold the lasix as pt has minimal oral intake. Continue with spironolactone.    Acute on Stage IV CKD Slight worsening of creatinine from 1.8 to 2.2 and plateaued at 2.2. BUN increased from 28 to 58. IV contrast exposure earlier this month on 05/20/21 for cardiac cath.  Appreciate Nephrology input.     Chronic diastolic heart failure Appears compensated at this time Patient currently denies any shortness of breath or chest pain but lower extremity edema present.    Hypokalemia  replaced.    Chronic hyponatremia.  Suspect a combination of cirrhosis and diuretic use. serum osmolarity is 277 and urine osmolarity and urine sodium are  pending. . Sodium is 131 today.  Continue to monitor.    Anemia of chronic disease No bleeding evident. Hemoglobin around 10.  Transfuse to keep hemoglobin greater than 7.    Thrombocytopenia Probably secondary to cirrhosis, no bleeding .  Continue to monitor.   Diabetes mellitus, non insulin dependent.  CBG (last 3)  Recent Labs    05/29/21 2108 05/30/21 0748  05/30/21 1108  GLUCAP 109* 133* 143*    Continue with sliding scale insulin . No changes in meds.  Hemoglobin A1c is 5.6.   Bilateral lower extremity edema;  Venous duplex of the lower extremity show non occlusive DVT in the popliteal vein, unable to anti coagulate fully due to h/o of GI bleed requiring hospitalization and 3 units of prbc transfusion earlier this year. She has an IVC Filter in place.  Dicussed the findings with the patient and daughter  and in agreement with the plan.    Sub conjunctival hemorrhage:  Supportive management, she denies any pain, or blurry vision, she reports feeling rubbery in the right eye.     DVT prophylaxis: Heparin sq. Code Status: FULL CODE Family Communication: none at bedside. Discussed with daughter over the phone.  Disposition:   Status is: Inpatient  Remains inpatient appropriate because:Ongoing diagnostic testing needed not appropriate for outpatient work up, Unsafe d/c plan, and IV treatments appropriate due to intensity of illness or inability to take PO  Dispo: The patient is from: Home              Anticipated d/c is to: SNF              Patient currently is not medically stable to d/c.   Difficult to place patient No       Consultants:  Orthopedics   Procedures: none.   Antimicrobials: ( Antibiotics Given (last 72 hours)     Date/Time Action Medication Dose Rate   05/27/21 2118 Given   rifaximin (XIFAXAN) tablet 550 mg 550 mg    05/28/21 0810 Given   rifaximin (XIFAXAN) tablet 550 mg 550 mg    05/28/21 1831 New Bag/Given   ceFEPIme (MAXIPIME) 2 g in sodium chloride 0.9 % 100 mL IVPB 2 g 200 mL/hr   05/28/21 2053 Given   rifaximin (XIFAXAN) tablet 550 mg 550 mg    05/29/21 0922 Given   rifaximin (XIFAXAN) tablet 550 mg 550 mg    05/29/21 1602 New Bag/Given   cefTRIAXone (ROCEPHIN) 1 g in sodium chloride 0.9 % 100 mL IVPB 1 g 200 mL/hr   05/29/21 2023 Given   rifaximin (XIFAXAN) tablet 550 mg 550 mg     05/30/21 0852 Given   rifaximin (XIFAXAN) tablet 550 mg 550 mg          Subjective: No chest pain or sob, nausea or vomiting.   Objective: Vitals:   05/30/21 0300 05/30/21 0400 05/30/21 0500 05/30/21 0600  BP: 124/64 (!) 111/55 (!) 121/52 (!) 113/53  Pulse: 88 75 79 75  Resp: 20 18 (!) 21 16  Temp:      TempSrc:      SpO2: 95% 95% 96% 93%  Weight:      Height:        Intake/Output Summary (Last 24 hours) at 05/30/2021 1509 Last data filed at 05/30/2021 0400 Gross per 24 hour  Intake 100.2 ml  Output 525 ml  Net -424.8 ml    Filed Weights   05/23/21 1631 05/29/21 1758  Weight: 98.8 kg 84.3 kg  Examination:  General exam: Elderly woman on room air not in any distress Respiratory system: Air entry fair bilateral, no wheezing or rhonchi Cardiovascular system: Tachycardic, no JVD pedal edema present Gastrointestinal system: Abdomen is soft, nontender bowel sounds normal Central nervous system: Alert and oriented to place and person, answering all questions appropriately and participating with PT Extremities: Bilateral lower extremity edema  Skin: No ulcers seen Psychiatry: Mood is appropriate    Data Reviewed: I have personally reviewed following labs and imaging studies  CBC: Recent Labs  Lab 05/23/21 2240 05/25/21 0443 05/27/21 0758 05/29/21 0431  WBC 7.2 8.4 10.8* 8.3  NEUTROABS 5.8  --  8.4*  --   HGB 9.8* 9.7* 10.1* 10.2*  HCT 29.9* 29.3* 30.9* 32.0*  MCV 83.3 80.9 83.7 84.0  PLT 142* 120* 135* 131*     Basic Metabolic Panel: Recent Labs  Lab 05/25/21 0443 05/26/21 0446 05/27/21 0758 05/28/21 0933 05/29/21 0431  NA 124* 128* 131* 129* 131*  K 3.5 3.9 4.4 4.0 3.8  CL 85* 90* 94* 92* 97*  CO2 _0 GLUCOSE 163* 137* 124* 139* 163*  BUN 36* 44* 49* 52* 58*  CREATININE 1.85* 2.17* 2.25* 2.28* 2.23*  CALCIUM 8.7* 8.6* 8.6* 8.7* 8.4*  MG 1.8  --   --   --   --      GFR: Estimated Creatinine Clearance: 24 mL/min (A) (by C-G  formula based on SCr of 2.23 mg/dL (H)).  Liver Function Tests: Recent Labs  Lab 05/26/21 0446 05/29/21 0431  AST 25 25  ALT 11 9  ALKPHOS 134* 131*  BILITOT 1.9* 1.6*  PROT 5.7* 5.8*  ALBUMIN 2.0* 1.9*     CBG: Recent Labs  Lab 05/29/21 1644 05/29/21 1807 05/29/21 2108 05/30/21 0748 05/30/21 1108  GLUCAP 146* 104* 109* 133* 143*      Recent Results (from the past 240 hour(s))  Resp Panel by RT-PCR (Flu A&B, Covid) Nasopharyngeal Swab     Status: None   Collection Time: 05/23/21 10:50 PM   Specimen: Nasopharyngeal Swab; Nasopharyngeal(NP) swabs in vial transport medium  Result Value Ref Range Status   SARS Coronavirus 2 by RT PCR NEGATIVE NEGATIVE Final    Comment: (NOTE) SARS-CoV-2 target nucleic acids are NOT DETECTED.  The SARS-CoV-2 RNA is generally detectable in upper respiratory specimens during the acute phase of infection. The lowest concentration of SARS-CoV-2 viral copies this assay can detect is 138 copies/mL. A negative result does not preclude SARS-Cov-2 infection and should not be used as the sole basis for treatment or other patient management decisions. A negative result may occur with  improper specimen collection/handling, submission of specimen other than nasopharyngeal swab, presence of viral mutation(s) within the areas targeted by this assay, and inadequate number of viral copies(<138 copies/mL). A negative result must be combined with clinical observations, patient history, and epidemiological information. The expected result is Negative.  Fact Sheet for Patients:  EntrepreneurPulse.com.au  Fact Sheet for Healthcare Providers:  IncredibleEmployment.be  This test is no t yet approved or cleared by the [place] FDA and  has been authorized for detection and/or diagnosis of SARS-CoV-2 by FDA under an Emergency Use Authorization (EUA). This EUA will remain  in effect (meaning this test can be used)  for the duration of the COVID-19 declaration under Section 564(b)(1) of the Act, 21 U.S.C.section 360bbb-3(b)(1), unless the authorization is terminated  or revoked sooner.       Influenza A by PCR NEGATIVE NEGATIVE  Final   Influenza B by PCR NEGATIVE NEGATIVE Final    Comment: (NOTE) The Xpert Xpress SARS-CoV-2/FLU/RSV plus assay is intended as an aid in the diagnosis of influenza from Nasopharyngeal swab specimens and should not be used as a sole basis for treatment. Nasal washings and aspirates are unacceptable for Xpert Xpress SARS-CoV-2/FLU/RSV testing.  Fact Sheet for Patients: EntrepreneurPulse.com.au  Fact Sheet for Healthcare Providers: IncredibleEmployment.be  This test is not yet approved or cleared by the [place] FDA and has been authorized for detection and/or diagnosis of SARS-CoV-2 by FDA under an Emergency Use Authorization (EUA). This EUA will remain in effect (meaning this test can be used) for the duration of the COVID-19 declaration under Section 564(b)(1) of the Act, 21 U.S.C. section 360bbb-3(b)(1), unless the authorization is terminated or revoked.  Performed at [facility], [address] [place], [place] [address]   Culture, blood (Routine X 2) w Reflex to ID Panel     Status: None (Preliminary result)   Collection Time: 05/28/21  6:00 PM   Specimen: BLOOD  Result Value Ref Range Status   Specimen Description BLOOD BLOOD LEFT FOREARM  Final   Special Requests   Final    BOTTLES DRAWN AEROBIC AND ANAEROBIC Blood Culture results may not be optimal due to an inadequate volume of blood received in culture bottles   Culture   Final    NO GROWTH 2 DAYS Performed at [facility], [address], [place], [place] [address]    Report Status PENDING  Incomplete  Culture, blood (Routine X 2) w Reflex to ID Panel     Status: None (Preliminary result)   Collection Time: 05/28/21  6:00 PM    Specimen: BLOOD  Result Value Ref Range Status   Specimen Description BLOOD BLOOD LEFT HAND  Final   Special Requests   Final    BOTTLES DRAWN AEROBIC AND ANAEROBIC Blood Culture results may not be optimal due to an inadequate volume of blood received in culture bottles   Culture   Final    NO GROWTH 2 DAYS Performed at [facility], [address], [place], [place] [address]    Report Status PENDING  Incomplete  Urine Culture     Status: Abnormal   Collection Time: 05/28/21 10:10 PM   Specimen: Urine, Random  Result Value Ref Range Status   Specimen Description   Final    URINE, RANDOM Performed at [facility], [address] [place], [place] [address]    Special Requests   Final    NONE Performed at [facility], [address] [place], [place] [address]    Culture (A)  Final    10,000 COLONIES/mL MULTIPLE SPECIES PRESENT, SUGGEST RECOLLECTION   Report Status 05/30/2021 FINAL  Final  MRSA Next Gen by PCR, Nasal     Status: None   Collection Time: 05/29/21  6:04 PM   Specimen: Nasal Mucosa; Nasal Swab  Result Value Ref Range Status   MRSA by PCR Next Gen NOT DETECTED NOT DETECTED Final    Comment: (NOTE) The GeneXpert MRSA Assay (FDA approved for NASAL specimens only), is one component of a comprehensive MRSA colonization surveillance program. It is not intended to diagnose MRSA infection nor to guide or monitor treatment for MRSA infections. Test performance is not FDA approved in patients less than 49 years old. Performed at [facility], [address], [place], [place] [address]  Radiology Studies: DG Chest Port 1 View  Result Date: 05/28/2021 CLINICAL DATA:  Possible sepsis40 y.o. 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 EXAM: PORTABLE CHEST 1 VIEW COMPARISON:  05/23/2021 and older exams. FINDINGS: Opacity noted at the lung  bases, greater on the right, consistent with atelectasis, mildly increased from the previous study. Remainder of the lungs is clear. Cardiac silhouette normal in size. Normal mediastinal and hilar contours. No convincing pleural effusion and no pneumothorax. Stable right internal jugular Port-A-Cath. IMPRESSION: 1. Mild, right greater than left, basilar atelectasis, mildly increased from the prior study. 2. No convincing pneumonia. No evidence of pulmonary edema. No other change. Electronically Signed   By: Lajean Manes M.D.   On: 05/28/2021 17:42   EEG adult  Result Date: 05/30/2021 Greta Doom, MD     05/30/2021  3:00 PM History: 70 yo F with AMS Sedation: None Technique: This is a 21 channel routine scalp EEG performed at the bedside with bipolar and monopolar montages arranged in accordance to the international 10/20 system of electrode placement. One channel was dedicated to EKG recording. Background: There is a well-formed posterior dominant rhythm of 8 Hz which is reactive to eye opening/closure.  In addition there is intrusion into the background of irregular low voltage delta and theta activity even during maximal wakefulness.  With drowsiness, there is prominent frontally predominant intermittent rhythmic delta activity(FIRDA). Photic stimulation: Physiologic driving is not performed EEG Abnormalities: 1) FIRDA 2) Generalized irregular slow activity. Clinical Interpretation: This EEG is consistent with a mild generalized nonspecific cerebral dysfunction (encephalopathy). There was no seizure or seizure predisposition recorded on this study. Please note that lack of epileptiform activity on EEG does not preclude the possibility of epilepsy. Roland Rack, MD [facility] [phone] If 7pm- 7am, please page neurology on call as listed in [place].        Scheduled Meds:  (feeding supplement) PROSource Plus  30 mL Oral TID BM   Chlorhexidine Gluconate Cloth  6 each  Topical Q0600   heparin injection (subcutaneous)  5,000 Units Subcutaneous Q8H   heparin lock flush  500 Units Intracatheter Q30 days   insulin aspart  0-9 Units Subcutaneous TID WC   lactulose  30 g Oral TID   multivitamin with minerals  1 tablet Oral Daily   pantoprazole  40 mg Oral BID   rifaximin  550 mg Oral BID   spironolactone  100 mg Oral Daily   Continuous Infusions:  cefTRIAXone (ROCEPHIN)  IV Stopped (05/29/21 1632)     LOS: 7 days        Hosie Poisson, MD [facility]   To contact the attending provider between 7A-7P or the covering provider during after hours 7P-7A, please log into the web site [email or web address] and access using universal [place] password for that web site. If you do not have the password, please call the hospital operator.  05/30/2021, 3:09 PM    Addendum:  RN reports she is having trouble swallowing , has become lethargic. Will get SLP evaluation.  Ammonia levls wnl. EEG added.  Requested neurology consultation for further evaluation.    Hosie Poisson. MD

## 2021-05-30 NOTE — Progress Notes (Signed)
Physical Therapy Treatment Patient Details Name: Crystal [name] MRN: [id number] DOB: 1951/01/05 Today's Date: 05/30/2021    History of Present Illness Pt is a 70 y.o. 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 CT knee showed nondisplaced fracture distal right femur with suprapatellar hemarthrosis.  Per orthopedic consult, pt to be NWB on RLE due to conservative management of fracture.    PT Comments    Transfer to ICU noted but continue on transfer orders in so PT continued.  Pt much more alert and engaged in session today.  Participated in exercises as described below.  Pt to EOB with mod a x 1 mostly for RLE.  She is able to sit EOB x 18 minutes today before fatigue.  Practiced lateral scooting to R but she is unable to make any progress without max a/dependant assist.  Overall tolerated activity well today with pain controlled.  Returned to supine with mod a x 1 for LE assist.    Plan is for pt to return to medical/ortho floor.  Will plan for OOB with [facility] style lift tomorrow.    Follow Up Recommendations  SNF     Equipment Recommendations       Recommendations for Other Services       Precautions / Restrictions Precautions Precautions: Fall Required Braces or Orthoses: Knee Immobilizer - Right Restrictions Weight Bearing Restrictions: Yes RLE Weight Bearing: Non weight bearing    Mobility  Bed Mobility Overal bed mobility: Needs Assistance Bed Mobility: Supine to Sit;Sit to Supine     Supine to sit: Mod assist Sit to supine: Mod assist   General bed mobility comments: excellent effort today most assist for RLE    Transfers Overall transfer level: Needs assistance   Transfers: Lateral/Scoot Transfers          Lateral/Scoot Transfers: Max assist;Total assist;+2  physical assistance General transfer comment: did not attempt due to inability to maitain NWB RLE  Ambulation/Gait                 Stairs             Wheelchair Mobility    Modified Rankin (Stroke Patients Only)       Balance Overall balance assessment: Needs assistance Sitting-balance support: Bilateral upper extremity supported Sitting balance-Leahy Scale: Fair     Standing balance support: Bilateral upper extremity supported Standing balance-Leahy Scale: Zero                              Cognition Arousal/Alertness: Awake/alert Behavior During Therapy: WFL for tasks assessed/performed Overall Cognitive Status: Within Functional Limits for tasks assessed                                        Exercises General Exercises - Lower Extremity Ankle Circles/Pumps: AROM;Both;10 reps;Supine Short Arc Quad: AROM;Strengthening;Left;10 reps;Supine Heel Slides: Left;10 reps;AROM;Supine Hip ABduction/ADduction: AROM;Both;10 reps;Supine Straight Leg Raises: AROM;Supine;Both;10 reps (2 reps)    General Comments        Pertinent Vitals/Pain Pain Assessment: Faces Faces Pain Scale: Hurts a little bit Pain Location: RLE with mobility but significant improvement in tolerance Pain Descriptors / Indicators: Sore;Grimacing  Home Living                      Prior Function            PT Goals (current goals can now be found in the care plan section) Progress towards PT goals: Progressing toward goals    Frequency    7X/week      PT Plan Current plan remains appropriate    Co-evaluation              AM-PAC PT "6 Clicks" Mobility   Outcome Measure  Help needed turning from your back to your side while in a flat bed without using bedrails?: Total Help needed moving from lying on your back to sitting on the side of a flat bed without using bedrails?: Total Help needed moving to and from a bed to a chair  (including a wheelchair)?: Total Help needed standing up from a chair using your arms (e.g., wheelchair or bedside chair)?: Total Help needed to walk in hospital room?: Total Help needed climbing 3-5 steps with a railing? : Total 6 Click Score: 6    End of Session   Activity Tolerance: Patient limited by lethargy;Patient tolerated treatment well Patient left: in bed;with call bell/phone within reach;with bed alarm set Nurse Communication: Mobility status PT Visit Diagnosis: Unsteadiness on feet (R26.81);Other abnormalities of gait and mobility (R26.89);Pain;Difficulty in walking, not elsewhere classified (R26.2);Muscle weakness (generalized) (M62.81) Pain - Right/Left: Right Pain - part of body: Knee;Leg     Time: 6599-3570 PT Time Calculation (min) (ACUTE ONLY): 38 min  Charges:  $Therapeutic Exercise: 8-22 mins $Therapeutic Activity: 8-22 mins                     Chesley Noon, PTA 05/30/21, 3:36 PM , 3:33 PM

## 2021-05-31 LAB — COMPREHENSIVE METABOLIC PANEL
ALT: 13 U/L (ref 0–44)
AST: 33 U/L (ref 15–41)
Albumin: 1.9 g/dL — ABNORMAL LOW (ref 3.5–5.0)
Alkaline Phosphatase: 124 U/L (ref 38–126)
Anion gap: 11 (ref 5–15)
BUN: 55 mg/dL — ABNORMAL HIGH (ref 8–23)
CO2: 28 mmol/L (ref 22–32)
Calcium: 8.5 mg/dL — ABNORMAL LOW (ref 8.9–10.3)
Chloride: 94 mmol/L — ABNORMAL LOW (ref 98–111)
Creatinine, Ser: 2.02 mg/dL — ABNORMAL HIGH (ref 0.44–1.00)
GFR, Estimated: 26 mL/min — ABNORMAL LOW (ref >60)
Glucose, Bld: 148 mg/dL — ABNORMAL HIGH (ref 70–99)
Potassium: 3.6 mmol/L (ref 3.5–5.1)
Sodium: 133 mmol/L — ABNORMAL LOW (ref 135–145)
Total Bilirubin: 1.4 mg/dL — ABNORMAL HIGH (ref 0.3–1.2)
Total Protein: 6 g/dL — ABNORMAL LOW (ref 6.5–8.1)

## 2021-05-31 LAB — CBC
HCT: 39.3 % (ref 36.0–46.0)
Hemoglobin: 12.3 g/dL (ref 12.0–15.0)
MCH: 26.2 pg (ref 26.0–34.0)
MCHC: 31.3 g/dL (ref 30.0–36.0)
MCV: 83.8 fL (ref 80.0–100.0)
Platelets: 145 10*3/uL — ABNORMAL LOW (ref 150–400)
RBC: 4.69 MIL/uL (ref 3.87–5.11)
RDW: 19.2 % — ABNORMAL HIGH (ref 11.5–15.5)
WBC: 6 10*3/uL (ref 4.0–10.5)
nRBC: 0 % (ref 0.0–0.2)

## 2021-05-31 LAB — GLUCOSE, CAPILLARY
Glucose-Capillary: 160 mg/dL — ABNORMAL HIGH (ref 70–99)
Glucose-Capillary: 151 mg/dL — ABNORMAL HIGH (ref 70–99)
Glucose-Capillary: 146 mg/dL — ABNORMAL HIGH (ref 70–99)
Glucose-Capillary: 186 mg/dL — ABNORMAL HIGH (ref 70–99)

## 2021-05-31 LAB — MAGNESIUM: Magnesium: 2.4 mg/dL (ref 1.7–2.4)

## 2021-05-31 LAB — AMMONIA: Ammonia: 18 umol/L (ref 9–35)

## 2021-05-31 MED ORDER — GLUCERNA SHAKE PO LIQD
237.0000 mL | Freq: Three times a day (TID) | ORAL | Status: DC
  Administered 2021-05-31 – 2021-06-03 (×9): 237 mL via ORAL

## 2021-05-31 NOTE — Plan of Care (Signed)
  Problem: Education: Goal: Knowledge of General Education information will improve Description: Including pain rating scale, medication(s)/side effects and non-pharmacologic comfort measures Outcome: Progressing

## 2021-05-31 NOTE — Progress Notes (Signed)
[facility]  ROUNDING NOTE   Subjective:   Crystal [name] is a 70 y.o. female with past medical conditions including anemia, CHF, cirrhosis, diabetes, GI bleed, and CKD4. Patient has been admitted to the hospital for Morbid obesity ([place]) [E66.01] Fracture of femur, distal, right, closed ([place]) [S72.401A] Cirrhosis of liver without ascites, unspecified hepatic cirrhosis type ([place]) [K74.60] Closed nondisplaced supracondylar fracture of distal end of right femur with intracondylar extension, initial encounter ([place]) [S72.464A] Congestive heart failure, unspecified HF chronicity, unspecified heart failure type ([name]) [I50.9]   Lab Results  Component Value Date   CREATININE 2.02 (H) 05/31/2021   CREATININE 2.23 (H) 05/29/2021   CREATININE 2.28 (H) 05/28/2021   07/18 0701 - 07/19 0700 In: -  Out: 400 [Urine:400]   Patient seen laying in bed eating breakfast Alert and oriented Tolerating meals Denies nausea, vomiting, and diarrhea Denies shortness of breath Patient seen later sitting up in chair   Objective:  Vital signs in last 24 hours:  Temp:  [97.5 F (36.4 C)-98.8 F (37.1 C)] 98.3 F (36.8 C) (07/19 0813) Pulse Rate:  [96-99] 98 (07/19 0813) Resp:  [18-19] 18 (07/19 0813) BP: (110-121)/(62-67) 110/67 (07/19 0813) SpO2:  [93 %-98 %] 93 % (07/19 0813)  Weight change:  Filed Weights   05/23/21 1631 05/29/21 1758  Weight: 98.8 kg 84.3 kg    Intake/Output: I/O last 3 completed shifts: In: 100 [IV Piggyback:100] Out: 925 [Urine:925]   Intake/Output this shift:  Total I/O In: -  Out: 300 [Urine:300]  Physical Exam: General: NAD  Head: Normocephalic, atraumatic. Moist oral mucosal membranes  Eyes: Anicteric  Lungs:  Clear to auscultation, normal breathing  Heart: Regular rate and rhythm  Abdomen:  Soft, nontender  Extremities:  no peripheral edema.  Neurologic: Nonfocal, moving all four extremities  Skin: No lesions       Basic Metabolic  Panel: Recent Labs  Lab 05/25/21 0443 05/26/21 0446 05/27/21 0758 05/28/21 0933 05/29/21 0431 05/31/21 0532  NA 124* 128* 131* 129* 131* 133*  K 3.5 3.9 4.4 4.0 3.8 3.6  CL 85* 90* 94* 92* 97* 94*  CO2 _0 GLUCOSE 163* 137* 124* 139* 163* 148*  BUN 36* 44* 49* 52* 58* 55*  CREATININE 1.85* 2.17* 2.25* 2.28* 2.23* 2.02*  CALCIUM 8.7* 8.6* 8.6* 8.7* 8.4* 8.5*  MG 1.8  --   --   --   --  2.4     Liver Function Tests: Recent Labs  Lab 05/26/21 0446 05/29/21 0431 05/31/21 0532  AST 25 25 33  ALT _1 ALKPHOS 134* 131* 124  BILITOT 1.9* 1.6* 1.4*  PROT 5.7* 5.8* 6.0*  ALBUMIN 2.0* 1.9* 1.9*    No results for input(s): LIPASE, AMYLASE in the last 168 hours. Recent Labs  Lab 05/28/21 1456 05/29/21 0431 05/31/21 0532  AMMONIA _2 CBC: Recent Labs  Lab 05/25/21 0443 05/27/21 0758 05/29/21 0431 05/31/21 0532  WBC 8.4 10.8* 8.3 6.0  NEUTROABS  --  8.4*  --   --   HGB 9.7* 10.1* 10.2* 12.3  HCT 29.3* 30.9* 32.0* 39.3  MCV 80.9 83.7 84.0 83.8  PLT 120* 135* 131* 145*     Cardiac Enzymes: No results for input(s): CKTOTAL, CKMB, CKMBINDEX, TROPONINI in the last 168 hours.  BNP: Invalid input(s): POCBNP  CBG: Recent Labs  Lab 05/30/21 0748 05/30/21 1108 05/30/21 1642 05/30/21 2042 05/31/21 0842  GLUCAP 133* 143* 135*  117* 160*     Microbiology: Results for orders placed or performed during the hospital encounter of 05/23/21  Resp Panel by RT-PCR (Flu A&B, Covid) Nasopharyngeal Swab     Status: None   Collection Time: 05/23/21 10:50 PM   Specimen: Nasopharyngeal Swab; Nasopharyngeal(NP) swabs in vial transport medium  Result Value Ref Range Status   SARS Coronavirus 2 by RT PCR NEGATIVE NEGATIVE Final    Comment: (NOTE) SARS-CoV-2 target nucleic acids are NOT DETECTED.  The SARS-CoV-2 RNA is generally detectable in upper respiratory specimens during the acute phase of infection. The lowest concentration of SARS-CoV-2  viral copies this assay can detect is 138 copies/mL. A negative result does not preclude SARS-Cov-2 infection and should not be used as the sole basis for treatment or other patient management decisions. A negative result may occur with  improper specimen collection/handling, submission of specimen other than nasopharyngeal swab, presence of viral mutation(s) within the areas targeted by this assay, and inadequate number of viral copies(<138 copies/mL). A negative result must be combined with clinical observations, patient history, and epidemiological information. The expected result is Negative.  Fact Sheet for Patients:  EntrepreneurPulse.com.au  Fact Sheet for Healthcare Providers:  IncredibleEmployment.be  This test is no t yet approved or cleared by the [place] FDA and  has been authorized for detection and/or diagnosis of SARS-CoV-2 by FDA under an Emergency Use Authorization (EUA). This EUA will remain  in effect (meaning this test can be used) for the duration of the COVID-19 declaration under Section 564(b)(1) of the Act, 21 U.S.C.section 360bbb-3(b)(1), unless the authorization is terminated  or revoked sooner.       Influenza A by PCR NEGATIVE NEGATIVE Final   Influenza B by PCR NEGATIVE NEGATIVE Final    Comment: (NOTE) The Xpert Xpress SARS-CoV-2/FLU/RSV plus assay is intended as an aid in the diagnosis of influenza from Nasopharyngeal swab specimens and should not be used as a sole basis for treatment. Nasal washings and aspirates are unacceptable for Xpert Xpress SARS-CoV-2/FLU/RSV testing.  Fact Sheet for Patients: EntrepreneurPulse.com.au  Fact Sheet for Healthcare Providers: IncredibleEmployment.be  This test is not yet approved or cleared by the [place] FDA and has been authorized for detection and/or diagnosis of SARS-CoV-2 by FDA under an Emergency Use Authorization  (EUA). This EUA will remain in effect (meaning this test can be used) for the duration of the COVID-19 declaration under Section 564(b)(1) of the Act, 21 U.S.C. section 360bbb-3(b)(1), unless the authorization is terminated or revoked.  Performed at [facility], [address] [place], [place] [address]   Culture, blood (Routine X 2) w Reflex to ID Panel     Status: None (Preliminary result)   Collection Time: 05/28/21  6:00 PM   Specimen: BLOOD  Result Value Ref Range Status   Specimen Description BLOOD BLOOD LEFT FOREARM  Final   Special Requests   Final    BOTTLES DRAWN AEROBIC AND ANAEROBIC Blood Culture results may not be optimal due to an inadequate volume of blood received in culture bottles   Culture   Final    NO GROWTH 3 DAYS Performed at [facility], [address] [place], [place] [address]    Report Status PENDING  Incomplete  Culture, blood (Routine X 2) w Reflex to ID Panel     Status: None (Preliminary result)   Collection Time: 05/28/21  6:00 PM   Specimen: BLOOD  Result Value Ref Range Status   Specimen Description BLOOD BLOOD  LEFT HAND  Final   Special Requests   Final    BOTTLES DRAWN AEROBIC AND ANAEROBIC Blood Culture results may not be optimal due to an inadequate volume of blood received in culture bottles   Culture   Final    NO GROWTH 3 DAYS Performed at [facility], [address], [place], [place] [address]    Report Status PENDING  Incomplete  Urine Culture     Status: Abnormal   Collection Time: 05/28/21 10:10 PM   Specimen: Urine, Random  Result Value Ref Range Status   Specimen Description   Final    URINE, RANDOM Performed at [facility], [address], [place], [place] [address]    Special Requests   Final    NONE Performed at [facility], [address] [place], [place] [address]    Culture (A)  Final    10,000 COLONIES/mL MULTIPLE SPECIES PRESENT, SUGGEST RECOLLECTION   Report  Status 05/30/2021 FINAL  Final  MRSA Next Gen by PCR, Nasal     Status: None   Collection Time: 05/29/21  6:04 PM   Specimen: Nasal Mucosa; Nasal Swab  Result Value Ref Range Status   MRSA by PCR Next Gen NOT DETECTED NOT DETECTED Final    Comment: (NOTE) The GeneXpert MRSA Assay (FDA approved for NASAL specimens only), is one component of a comprehensive MRSA colonization surveillance program. It is not intended to diagnose MRSA infection nor to guide or monitor treatment for MRSA infections. Test performance is not FDA approved in patients less than 5 years old. Performed at [facility], [address] [place], [place] [address]     Coagulation Studies: No results for input(s): LABPROT, INR in the last 72 hours.  Urinalysis: Recent Labs    05/28/21 1642  COLORURINE YELLOW*  LABSPEC 1.009  PHURINE 8.0  GLUCOSEU NEGATIVE  HGBUR NEGATIVE  BILIRUBINUR NEGATIVE  KETONESUR NEGATIVE  PROTEINUR NEGATIVE  NITRITE NEGATIVE  LEUKOCYTESUR LARGE*       Imaging: EEG adult  Result Date: 05/30/2021 Greta Doom, MD     05/30/2021  3:00 PM History: 70 yo F with AMS Sedation: None Technique: This is a 21 channel routine scalp EEG performed at the bedside with bipolar and monopolar montages arranged in accordance to the international 10/20 system of electrode placement. One channel was dedicated to EKG recording. Background: There is a well-formed posterior dominant rhythm of 8 Hz which is reactive to eye opening/closure.  In addition there is intrusion into the background of irregular low voltage delta and theta activity even during maximal wakefulness.  With drowsiness, there is prominent frontally predominant intermittent rhythmic delta activity(FIRDA). Photic stimulation: Physiologic driving is not performed EEG Abnormalities: 1) FIRDA 2) Generalized irregular slow activity. Clinical Interpretation: This EEG is consistent with a mild generalized nonspecific cerebral  dysfunction (encephalopathy). There was no seizure or seizure predisposition recorded on this study. Please note that lack of epileptiform activity on EEG does not preclude the possibility of epilepsy. Roland Rack, MD [facility] [phone] If 7pm- 7am, please page neurology on call as listed in [place].     Medications:    cefTRIAXone (ROCEPHIN)  IV 1 g (05/30/21 1644)    (feeding supplement) PROSource Plus  30 mL Oral TID BM   Chlorhexidine Gluconate Cloth  6 each Topical Q0600   heparin injection (subcutaneous)  5,000 Units Subcutaneous Q8H   heparin lock flush  500 Units Intracatheter Q30 days   insulin aspart  0-9 Units Subcutaneous TID WC   lactulose  30 g Oral TID   multivitamin with minerals  1 tablet Oral Daily   pantoprazole  40 mg Oral BID   rifaximin  550 mg Oral BID   spironolactone  100 mg Oral Daily   acetaminophen, albuterol, benzonatate, guaiFENesin-dextromethorphan, heparin lock flush **AND** heparin lock flush, lactulose, morphine injection, ondansetron **OR** ondansetron (ZOFRAN) IV, polyvinyl alcohol  Assessment/ Plan:  Crystal [name] is a 70 y.o.  female with past medical conditions including anemia, CHF, non alcoholic cirrhosis, h/o TIPS, diabetes, h/o GI bleed, and CKD4. Patient has been admitted to the hospital for Morbid obesity ([place]) [E66.01] Fracture of femur, distal, right, closed ([place]) [S72.401A] Cirrhosis of liver without ascites, unspecified hepatic cirrhosis type ([place]) [K74.60] Closed nondisplaced supracondylar fracture of distal end of right femur with intracondylar extension, initial encounter ([place]) [S72.464A] Congestive heart failure, unspecified HF chronicity, unspecified heart failure type ([place]) [I50.9]   Acute Kidney Injury on chronic kidney disease stage 4 with baseline creatinine 1.85 and GFR of 29 on 05/25/2021.  Renal ultrasound neg for hydronephrosis (05/27/21) IV contrast exposure on 05/20/21 during cardiac  cath Creatinine continues to improve   Lab Results  Component Value Date   CREATININE 2.02 (H) 05/31/2021   CREATININE 2.23 (H) 05/29/2021   CREATININE 2.28 (H) 05/28/2021    Intake/Output Summary (Last 24 hours) at 05/31/2021 1058 Last data filed at 05/31/2021 1029 Gross per 24 hour  Intake --  Output 700 ml  Net -700 ml     2. Anemia of chronic kidney disease  Lab Results  Component Value Date   HGB 12.3 05/31/2021   Hgb at goal   3. Chronic hyponatremia Likely due to underlying cirrhosis Na level continues to improve (ranges 128-139 for her) Continue to encourage oral intake May end up needing fluid restriction if levels drop   4. Admitted post mechanical fall resulting in closed non displaced right femur fracture with supra patellar hemarthrosis   LOS: 8   7/19/202210:58 AM

## 2021-05-31 NOTE — Progress Notes (Addendum)
PROGRESS NOTE    Crystal [name]  PPJ:[id number] DOB: Jun 12, 1951 DOA: 05/23/2021 PCP: Ileene Hutchinson, PA    Chief Complaint  Patient presents with   Fall    Brief Narrative:  Crystal [name] is a 70 y.o. 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   Patient usually ambulates with a walker, but was using her cane and tripped over her shoe.  No loss of consciousness, no preceding chest pain, palpitations, dizziness.     In the ER, CT showed nondisplaced femur fracture, distal right femur with suprapatellar hemarthrosis.  Orthopedics were consulted and the hospitalist service were asked to evaluate. Pt seen and examined at bedside, she is alert and oriented, eating breakfast by herself. Denies any new complaints.   Assessment & Plan:   Principal Problem:   Fracture of femur, distal, right, closed ([place]) Active Problems:   S/P TIPS (transjugular intrahepatic portosystemic shunt)   Coronary artery disease involving native coronary artery of native heart without angina pectoris   Liver cirrhosis secondary to NASH (HCC)   Chronic diastolic CHF (congestive heart failure) (HCC)   Diabetes mellitus, type 2 (HCC)   Chronic kidney disease, stage IV (severe) (HCC)   Closed fracture of distal end of right femur ([place])   Accidental fall   Pressure injury of skin   Mechanical fall leading to distal femur fracture on the right -Orthopedics consulted recommended nonsurgical management at this time. Therapy evaluations recommending SNF.  TOC on board, palliative care consulted for goals of care, after discussing with family they wanted full scope of treatment.  Pain control.    Urinary tract infection;  Started empirically on IV antibiotics, follow urine and blood cultures and transition to oral antibiotics as appropriate.  Urine cultures showed 10,000 colonies, but since she is improving clinically, we will continue the  antibiotics for atleast 5 days.  She continues to improve clinically, answering questions appropriately.   Acute hepatic encephalopathy and Acute metabolic encephalopathy:  Initially Improved with lactulose enemas. Ammonia levels normalized.  MRI brain without contrast is negative for acute stroke.  Avoid narcotic pain medications as she remained lethargic for atleast 24 hours from one dose of oxycodone.  She was also found to have an UTI , blood cultures are negative, urine cultures show  10,000 multiple species present.  She was empirically started on IV antibiotics, with improvement in the mental status, recommend to complete the course of antibiotics.  Therapy evaluations recommending SNF.     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 US abdomen is negative for ascites.  Rifaximin added to her regimen We had to hold the lasix as pt has minimal oral intake. Continue with spironolactone.    Acute on Stage IV CKD Slight worsening of creatinine from 1.8 to 2.2 and plateaued at 2 . IV contrast exposure earlier this month on 05/20/21 for cardiac cath.  Appreciate Nephrology input.     Chronic diastolic heart failure Appears compensated at this time Patient currently denies any shortness of breath or chest pain but lower extremity edema present.    Hypokalemia  replaced.    Chronic hyponatremia.  Much improved to 133.  Suspect a combination of cirrhosis and diuretic use. serum osmolarity is 277  Continue to monitor.    Anemia of chronic disease No bleeding evident. Hemoglobin around 10.  Transfuse to keep hemoglobin greater  than 7.    Thrombocytopenia Probably secondary to cirrhosis, no bleeding .  Continue to monitor.   Diabetes mellitus, non insulin dependent.  CBG (last 3)  Recent Labs    05/30/21 1642 05/30/21 2042 05/31/21 0842  GLUCAP 135* 117* 160*    Continue with sliding scale insulin . No  changes in meds.  Hemoglobin A1c is 5.6.   Bilateral lower extremity edema;  Venous duplex of the lower extremity show non occlusive DVT in the popliteal vein, unable to anti coagulate fully due to h/o of GI bleed requiring hospitalization and 3 units of prbc transfusion earlier this year. She has an IVC Filter in place.  Dicussed the findings with the patient and daughter  and in agreement with the plan.    Sub conjunctival hemorrhage:  Supportive management, she denies any pain, or blurry vision, she reports feeling rubbery in the right eye.    Pressure injury present on admission.  Pressure Injury 05/29/21 Thigh Posterior;Proximal;Right Stage 2 -  Partial thickness loss of dermis presenting as a shallow open injury with a red, pink wound bed without slough. (Active)  05/29/21 1837  Location: Thigh  Location Orientation: Posterior;Proximal;Right  Staging: Stage 2 -  Partial thickness loss of dermis presenting as a shallow open injury with a red, pink wound bed without slough.  Wound Description (Comments):   Present on Admission: Yes (present upon admission to icu)     Pressure Injury 05/29/21 Coccyx Medial Stage 1 -  Intact skin with non-blanchable redness of a localized area usually over a bony prominence. (Active)  05/29/21 1837  Location: Coccyx  Location Orientation: Medial  Staging: Stage 1 -  Intact skin with non-blanchable redness of a localized area usually over a bony prominence.  Wound Description (Comments):   Present on Admission: Yes (present upon admission to icu)  Will request wound care consult.         DVT prophylaxis: Heparin sq. Code Status: FULL CODE Family Communication: none at bedside. Discussed with daughter over the phone.  Disposition:   Status is: Inpatient  Remains inpatient appropriate because:Ongoing diagnostic testing needed not appropriate for outpatient work up, Unsafe d/c plan, and IV treatments appropriate due to intensity of illness  or inability to take PO  Dispo: The patient is from: Home              Anticipated d/c is to: SNF              Patient currently is medically stable to d/c.   Difficult to place patient No       Consultants:  Orthopedics   Procedures: none.   Antimicrobials: ( Antibiotics Given (last 72 hours)     Date/Time Action Medication Dose Rate   05/28/21 1831 New Bag/Given   ceFEPIme (MAXIPIME) 2 g in sodium chloride 0.9 % 100 mL IVPB 2 g 200 mL/hr   05/28/21 2053 Given   rifaximin (XIFAXAN) tablet 550 mg 550 mg    05/29/21 0922 Given   rifaximin (XIFAXAN) tablet 550 mg 550 mg    05/29/21 1602 New Bag/Given   cefTRIAXone (ROCEPHIN) 1 g in sodium chloride 0.9 % 100 mL IVPB 1 g 200 mL/hr   05/29/21 2023 Given   rifaximin (XIFAXAN) tablet 550 mg 550 mg    05/30/21 0852 Given   rifaximin (XIFAXAN) tablet 550 mg 550 mg    05/30/21 1644 New Bag/Given   cefTRIAXone (ROCEPHIN) 1 g in sodium chloride 0.9 % 100 mL IVPB 1 g  200 mL/hr   05/30/21 2115 Given   rifaximin (XIFAXAN) tablet 550 mg 550 mg    05/31/21 1023 Given   rifaximin (XIFAXAN) tablet 550 mg 550 mg          Subjective: No new complaints.   Objective: Vitals:   05/30/21 2041 05/30/21 2050 05/31/21 0424 05/31/21 0813  BP: 121/62  117/62 110/67  Pulse: 96 99 99 98  Resp: _0 Temp: 98.8 F (37.1 C)  (!) 97.5 F (36.4 C) 98.3 F (36.8 C)  TempSrc:      SpO2:  98% 93% 93%  Weight:      Height:        Intake/Output Summary (Last 24 hours) at 05/31/2021 1048 Last data filed at 05/31/2021 1029 Gross per 24 hour  Intake --  Output 700 ml  Net -700 ml    Filed Weights   05/23/21 1631 05/29/21 1758  Weight: 98.8 kg 84.3 kg    Examination:  General exam: Elderly woman , on RA eating breakfast by herself, not in distress.  Respiratory system: Clear to auscultation, no wheezing heard. On RA.  Cardiovascular system: RRR, no JVD, pedal edema present.  Gastrointestinal system: Abdomen is soft, non tender  non distended bowel sounds wnl.  Central nervous system: Alert and oriented, non focal.  Extremities: Bilateral lower extremity edema  Skin: stage 1 pressure on coccyx.  Psychiatry: Mood is appropriate.     Data Reviewed: I have personally reviewed following labs and imaging studies  CBC: Recent Labs  Lab 05/25/21 0443 05/27/21 0758 05/29/21 0431 05/31/21 0532  WBC 8.4 10.8* 8.3 6.0  NEUTROABS  --  8.4*  --   --   HGB 9.7* 10.1* 10.2* 12.3  HCT 29.3* 30.9* 32.0* 39.3  MCV 80.9 83.7 84.0 83.8  PLT 120* 135* 131* 145*     Basic Metabolic Panel: Recent Labs  Lab 05/25/21 0443 05/26/21 0446 05/27/21 0758 05/28/21 0933 05/29/21 0431 05/31/21 0532  NA 124* 128* 131* 129* 131* 133*  K 3.5 3.9 4.4 4.0 3.8 3.6  CL 85* 90* 94* 92* 97* 94*  CO2 _1 GLUCOSE 163* 137* 124* 139* 163* 148*  BUN 36* 44* 49* 52* 58* 55*  CREATININE 1.85* 2.17* 2.25* 2.28* 2.23* 2.02*  CALCIUM 8.7* 8.6* 8.6* 8.7* 8.4* 8.5*  MG 1.8  --   --   --   --  2.4     GFR: Estimated Creatinine Clearance: 26.5 mL/min (A) (by C-G formula based on SCr of 2.02 mg/dL (H)).  Liver Function Tests: Recent Labs  Lab 05/26/21 0446 05/29/21 0431 05/31/21 0532  AST 25 25 33  ALT _2 ALKPHOS 134* 131* 124  BILITOT 1.9* 1.6* 1.4*  PROT 5.7* 5.8* 6.0*  ALBUMIN 2.0* 1.9* 1.9*     CBG: Recent Labs  Lab 05/30/21 0748 05/30/21 1108 05/30/21 1642 05/30/21 2042 05/31/21 0842  GLUCAP 133* 143* 135* 117* 160*      Recent Results (from the past 240 hour(s))  Resp Panel by RT-PCR (Flu A&B, Covid) Nasopharyngeal Swab     Status: None   Collection Time: 05/23/21 10:50 PM   Specimen: Nasopharyngeal Swab; Nasopharyngeal(NP) swabs in vial transport medium  Result Value Ref Range Status   SARS Coronavirus 2 by RT PCR NEGATIVE NEGATIVE Final    Comment: (NOTE) SARS-CoV-2 target nucleic acids are NOT DETECTED.  The SARS-CoV-2 RNA is generally detectable in upper respiratory specimens  during the acute  phase of infection. The lowest concentration of SARS-CoV-2 viral copies this assay can detect is 138 copies/mL. A negative result does not preclude SARS-Cov-2 infection and should not be used as the sole basis for treatment or other patient management decisions. A negative result may occur with  improper specimen collection/handling, submission of specimen other than nasopharyngeal swab, presence of viral mutation(s) within the areas targeted by this assay, and inadequate number of viral copies(<138 copies/mL). A negative result must be combined with clinical observations, patient history, and epidemiological information. The expected result is Negative.  Fact Sheet for Patients:  EntrepreneurPulse.com.au  Fact Sheet for Healthcare Providers:  IncredibleEmployment.be  This test is no t yet approved or cleared by the [place] FDA and  has been authorized for detection and/or diagnosis of SARS-CoV-2 by FDA under an Emergency Use Authorization (EUA). This EUA will remain  in effect (meaning this test can be used) for the duration of the COVID-19 declaration under Section 564(b)(1) of the Act, 21 U.S.C.section 360bbb-3(b)(1), unless the authorization is terminated  or revoked sooner.       Influenza A by PCR NEGATIVE NEGATIVE Final   Influenza B by PCR NEGATIVE NEGATIVE Final    Comment: (NOTE) The Xpert Xpress SARS-CoV-2/FLU/RSV plus assay is intended as an aid in the diagnosis of influenza from Nasopharyngeal swab specimens and should not be used as a sole basis for treatment. Nasal washings and aspirates are unacceptable for Xpert Xpress SARS-CoV-2/FLU/RSV testing.  Fact Sheet for Patients: EntrepreneurPulse.com.au  Fact Sheet for Healthcare Providers: IncredibleEmployment.be  This test is not yet approved or cleared by the [place] FDA and has been authorized for detection  and/or diagnosis of SARS-CoV-2 by FDA under an Emergency Use Authorization (EUA). This EUA will remain in effect (meaning this test can be used) for the duration of the COVID-19 declaration under Section 564(b)(1) of the Act, 21 U.S.C. section 360bbb-3(b)(1), unless the authorization is terminated or revoked.  Performed at [facility], [address] [place], [place] [address]   Culture, blood (Routine X 2) w Reflex to ID Panel     Status: None (Preliminary result)   Collection Time: 05/28/21  6:00 PM   Specimen: BLOOD  Result Value Ref Range Status   Specimen Description BLOOD BLOOD LEFT FOREARM  Final   Special Requests   Final    BOTTLES DRAWN AEROBIC AND ANAEROBIC Blood Culture results may not be optimal due to an inadequate volume of blood received in culture bottles   Culture   Final    NO GROWTH 3 DAYS Performed at [facility], [address], [place], [place] [address]    Report Status PENDING  Incomplete  Culture, blood (Routine X 2) w Reflex to ID Panel     Status: None (Preliminary result)   Collection Time: 05/28/21  6:00 PM   Specimen: BLOOD  Result Value Ref Range Status   Specimen Description BLOOD BLOOD LEFT HAND  Final   Special Requests   Final    BOTTLES DRAWN AEROBIC AND ANAEROBIC Blood Culture results may not be optimal due to an inadequate volume of blood received in culture bottles   Culture   Final    NO GROWTH 3 DAYS Performed at [facility], [address] [place], [place] [address]    Report Status PENDING  Incomplete  Urine Culture     Status: Abnormal   Collection Time: 05/28/21 10:10 PM   Specimen: Urine, Random  Result Value Ref Range Status  Specimen Description   Final    URINE, RANDOM Performed at [facility], [address] [place], [place] [address]    Special Requests   Final    NONE Performed at [facility], [address] [place], [place] [address]    Culture (A)  Final     10,000 COLONIES/mL MULTIPLE SPECIES PRESENT, SUGGEST RECOLLECTION   Report Status Jun 12, 2021 FINAL  Final  MRSA Next Gen by PCR, Nasal     Status: None   Collection Time: 05/29/21  6:04 PM   Specimen: Nasal Mucosa; Nasal Swab  Result Value Ref Range Status   MRSA by PCR Next Gen NOT DETECTED NOT DETECTED Final    Comment: (NOTE) The GeneXpert MRSA Assay (FDA approved for NASAL specimens only), is one component of a comprehensive MRSA colonization surveillance program. It is not intended to diagnose MRSA infection nor to guide or monitor treatment for MRSA infections. Test performance is not FDA approved in patients less than 92 years old. Performed at [facility], [address], [place], [place] [address]           Radiology Studies: EEG adult  Result Date: 06-12-2021 Greta Doom, MD     2021-06-12  3:00 PM History: 70 yo F with AMS Sedation: None Technique: This is a 21 channel routine scalp EEG performed at the bedside with bipolar and monopolar montages arranged in accordance to the international 10/20 system of electrode placement. One channel was dedicated to EKG recording. Background: There is a well-formed posterior dominant rhythm of 8 Hz which is reactive to eye opening/closure.  In addition there is intrusion into the background of irregular low voltage delta and theta activity even during maximal wakefulness.  With drowsiness, there is prominent frontally predominant intermittent rhythmic delta activity(FIRDA). Photic stimulation: Physiologic driving is not performed EEG Abnormalities: 1) FIRDA 2) Generalized irregular slow activity. Clinical Interpretation: This EEG is consistent with a mild generalized nonspecific cerebral dysfunction (encephalopathy). There was no seizure or seizure predisposition recorded on this study. Please note that lack of epileptiform activity on EEG does not preclude the possibility of epilepsy. Roland Rack, MD [facility] [phone] If 7pm- 7am, please page neurology on call as listed in [place].        Scheduled Meds:  (feeding supplement) PROSource Plus  30 mL Oral TID BM   Chlorhexidine Gluconate Cloth  6 each Topical Q0600   heparin injection (subcutaneous)  5,000 Units Subcutaneous Q8H   heparin lock flush  500 Units Intracatheter Q30 days   insulin aspart  0-9 Units Subcutaneous TID WC   lactulose  30 g Oral TID   multivitamin with minerals  1 tablet Oral Daily   pantoprazole  40 mg Oral BID   rifaximin  550 mg Oral BID   spironolactone  100 mg Oral Daily   Continuous Infusions:  cefTRIAXone (ROCEPHIN)  IV 1 g (12-Jun-2021 1644)     LOS: 8 days        Hosie Poisson, MD [facility]   To contact the attending provider between 7A-7P or the covering provider during after hours 7P-7A, please log into the web site [email or web address] and access using universal [place] password for that web site. If you do not have the password, please call the hospital operator.  05/31/2021, 10:48 AM

## 2021-05-31 NOTE — Progress Notes (Signed)
Daily Progress Note   Patient Name: Crystal [name]       Date: 05/31/2021 DOB: [birth year]  Age: 70 y.o. MRN#: [id number] Attending Physician: Hosie Poisson, MD Primary Care Physician: Ileene Hutchinson, [place] Admit Date: 05/23/2021  Reason for Consultation/Follow-up: Establishing goals of care  Subjective: Chart Reviewed. Updates Received. Patient Assessed.   Patient sitting up in chair. Denies pain or shortness of breath. No acute distress. Awake and alert. States she is feeling better and hopes to discharge to rehab soon.   No family is at the bedside. Discussed goals of care. Goals remain set and clear. Patient expresses hopes to continue to regain some strength with a goal of returning home with her family. She wishes for full code and full scope care to allow her every opportunity to improve.   All questions answered and support provided.    Length of Stay: 8 days  Vital Signs: BP 118/65 (BP Location: Right Arm)   Pulse 96   Temp 98.8 F (37.1 C)   Resp 18   Ht 5' 2" (1.575 m)   Wt 84.3 kg   LMP  (LMP Unknown)   SpO2 93%   BMI 33.99 kg/m  SpO2: SpO2: 93 % O2 Device: O2 Device: Room Air O2 Flow Rate: O2 Flow Rate (L/min): 2 L/min  Physical Exam: NAD RRR Diminished bilaterally AAO x3, mood appropriate              Code Status: Full code  Goals of Care/Recommendations: Continue to treat the treatable patient and family is remaining hopeful for some stability/improvement.  Patient expressed goals for SNF rehab with hopes of eventually returning to her home.  Recommendations for outpatient palliative support. Patient and family is aware this may be initiated at anytime.  PMT is available for urgent needs. Goals are clear and set.    Prognosis: Guarded   Discharge Planning:  SNF rehab  Thank you for allowing the Palliative Medicine Team to assist in the care of this patient.  Time Total: 25 min.   Visit consisted of counseling and education dealing with the complex  and emotionally intense issues of symptom management and palliative care in the setting of serious and potentially life-threatening illness.Greater than 50%  of this time was spent counseling and coordinating care related to the above assessment and plan.  Alda Lea, AGPCNP-BC  Palliative Medicine Team [phone]

## 2021-05-31 NOTE — Progress Notes (Signed)
Physical Therapy Treatment Patient Details Name: Crystal [name] MRN: [id number] DOB: June 11, 1951 Today's Date: 05/31/2021    History of Present Illness Pt is a 70 y.o. 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 CT knee showed nondisplaced fracture distal right femur with suprapatellar hemarthrosis.  Per orthopedic consult, pt to be NWB on RLE due to conservative management of fracture.    PT Comments    Pt was long sitting in bed upon arriving. She was finishing breakfast but agreeable to session. Overall pt tolerated session well. Session focused on strengthening and getting pt OOB to chair. Pt tolerated sitting EOB with +2 assistance for several minutes prior to performing hoyer lift transfers to recliner. RN staff aware post session and will contact author for assistance with returning pt to bed later this afternoon. Highly recommend DC to SNF to address deficits while maximizing independence with ADLs.    Follow Up Recommendations  SNF     Equipment Recommendations  Other (comment) (defer to next level of care)       Precautions / Restrictions Precautions Precautions: Fall Restrictions Weight Bearing Restrictions: Yes RLE Weight Bearing: Non weight bearing    Mobility  Bed Mobility Overal bed mobility: Needs Assistance Bed Mobility: Supine to Sit;Sit to Supine Rolling: Max assist;+2 for physical assistance   Supine to sit: Max assist Sit to supine: Max assist   General bed mobility comments: pt requires max assist to progress to/from EOB via log roll technqiue. tolerated EOB sitting for a ~ 5 minutes prior to returning to bed then mechanical lifted to recliner.    Transfers Overall transfer level: Needs assistance              Lateral/Scoot Transfers: Total assist;+2  safety/equipment General transfer comment: pt was hoyer lifted from bed to recliner with +2 assistance. Encouraged staying OOB in recliner for as long as possible today.     Balance Overall balance assessment: Needs assistance Sitting-balance support: Bilateral upper extremity supported Sitting balance-Leahy Scale: Fair        Cognition Arousal/Alertness: Awake/alert Behavior During Therapy: WFL for tasks assessed/performed Overall Cognitive Status: No family/caregiver present to determine baseline cognitive functioning        General Comments: Pt alert, oriented to situation, place but does have some cognition deficits come to light during session. author questions if its due to medications.             Pertinent Vitals/Pain Pain Assessment: Faces Faces Pain Scale: Hurts a little bit Pain Location: RLE with mobility but significant improvement in tolerance Pain Descriptors / Indicators: Sore;Grimacing Pain Intervention(s): Limited activity within patient's tolerance;Monitored during session;Premedicated before session;Repositioned;Ice applied     PT Goals (current goals can now be found in the care plan section) Acute Rehab PT Goals Patient Stated Goal: go home Progress towards PT goals: Progressing toward goals (limited by wt bearing restrictions)    Frequency    7X/week      PT Plan Current plan remains appropriate       AM-PAC PT "6 Clicks" Mobility   Outcome Measure  Help needed turning from your back to your side while in a flat bed without using bedrails?: A Lot Help needed moving from lying on your back to sitting on the side of a flat bed without using bedrails?:  Total Help needed moving to and from a bed to a chair (including a wheelchair)?: Total Help needed standing up from a chair using your arms (e.g., wheelchair or bedside chair)?: Total Help needed to walk in hospital room?: Total Help needed climbing 3-5 steps with a railing? : Total 6 Click  Score: 7    End of Session Equipment Utilized During Treatment: Other (comment) (hoyer lift) Activity Tolerance: Patient limited by lethargy;Patient tolerated treatment well Patient left: in chair;with call bell/phone within reach;with chair alarm set Nurse Communication: Mobility status PT Visit Diagnosis: Unsteadiness on feet (R26.81);Other abnormalities of gait and mobility (R26.89);Pain;Difficulty in walking, not elsewhere classified (R26.2);Muscle weakness (generalized) (M62.81) Pain - Right/Left: Right Pain - part of body: Knee;Leg     Time: 8563-1497 PT Time Calculation (min) (ACUTE ONLY): 40 min  Charges:  $Therapeutic Exercise: 8-22 mins $Therapeutic Activity: 23-37 mins                    Julaine Fusi PTA 05/31/21, 11:25 AM

## 2021-05-31 NOTE — TOC Progression Note (Signed)
Transition of Care [facility]) - Progression Note    Patient Details  Name: Crystal [name] MRN: [id number] Date of Birth: 1951-01-01  Transition of Care [facility]) CM/SW Contact  Su Hilt, RN Phone Number: 05/31/2021, 9:40 AM  Clinical Narrative:     Reached out to [facility] and left a secure Voice mail for [place] in admissions.  Requested insurance auth process to be started, sent message thru the Hub requesting the same, requested a call back to verify  Expected Discharge Plan: [place] Barriers to Discharge: Continued Medical Work up  Expected Discharge Plan and Services Expected Discharge Plan: [place]   Discharge Planning Services: CM Consult                                           Social Determinants of Health (SDOH) Interventions    Readmission Risk Interventions Readmission Risk Prevention Plan 04/28/2021 04/07/2021  Transportation Screening Complete Complete  Medication Review Press photographer) Complete Complete  PCP or Specialist appointment within 3-5 days of discharge - Complete  HRI or Home Care Consult Complete Complete  SW Recovery Care/Counseling Consult - Complete  Palliative Care Screening Not Applicable Not [place] Not Applicable Not Applicable  Some recent data might be hidden

## 2021-05-31 NOTE — Care Management Important Message (Signed)
Important Message  Patient Details  Name: Crystal [name] MRN: [id number] Date of Birth: 1950-11-21   Medicare Important Message Given:  Yes     Juliann Pulse A Phenix Grein 05/31/2021, 11:55 AM

## 2021-05-31 NOTE — Progress Notes (Signed)
Nutrition Follow-up  DOCUMENTATION CODES:  Obesity unspecified  INTERVENTION:  Liberalize diet to carb modified with no added Na packets, encourage PO intake Glucerna Shake po TID, each supplement provides 220 kcal and 10 grams of protein MVI with minerals daily.  NUTRITION DIAGNOSIS:  Increased nutrient needs related to post-op healing as evidenced by estimated needs.  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  Orthopedics recommended nonoperative management upon evaluation  Pt developed worsening confusion over the course of admission, neurology consulted and determined source was heaptic and metabolic encephalopathy. Confusion has improved. AKI also present with nephrology following.   Pt resting in bedside chair at the time of assessment, answered questions, but slow to respond. Would only provide 1 word answers. Significant other at bedside reports pt's intake has been poor this admission and is eating very little. Also not drinking very much.   Pt would benefit from a diet liberalization to encourage PO intake. Will adjust to carb modified with no added salt packets on tray and add glucerna between meals.  Average Meal Intake: 7/11-7/19: 43% intake x 8 recorded meals  Nutritionally Relevant Medications: Scheduled Meds:  (feeding supplement) PROSource Plus  30 mL Oral TID BM   insulin aspart  0-9 Units Subcutaneous TID WC   lactulose  30 g Oral TID   multivitamin with minerals  1 tablet Oral Daily   pantoprazole  40 mg Oral BID   spironolactone  100 mg Oral Daily   Continuous Infusions:  cefTRIAXone (ROCEPHIN)  IV 1 g (05/30/21 1644)   PRN Meds:  lactulose, ondansetron  Labs Reviewed: Na 133, chloride 94 BUN 55, creatinine 2.02 SBG ranges from 117-160 mg/dL over the last 24 hours HgbA1c 5.6% (7/7)  NUTRITION - FOCUSED PHYSICAL EXAM: Flowsheet Row Most Recent Value  Orbital Region No depletion  Upper Arm Region No depletion  Thoracic and Lumbar Region No depletion  Buccal Region No depletion  Temple Region No depletion  Clavicle Bone Region No depletion  Clavicle and Acromion Bone Region No depletion  Scapular Bone Region No depletion  Dorsal Hand No depletion  Patellar Region No depletion  Anterior Thigh Region No depletion  Posterior Calf Region No depletion  Edema (RD Assessment) Moderate  [LLE]  Hair Reviewed  Eyes Reviewed  Mouth Reviewed  Skin Reviewed  Nails Reviewed   Diet Order:   Diet Order             Diet heart healthy/carb modified Room service appropriate? Yes; Fluid consistency: Thin  Diet effective now                  EDUCATION NEEDS:  Education needs have been addressed  Skin:  Skin Assessment: Reviewed RN Assessment (Skin red on LLE)  Last BM:  7/19 - type 6  Height:  Ht Readings from Last 1 Encounters:  05/23/21 5' 2" (1.575 m)   Weight:  Wt Readings from Last 1 Encounters:  05/29/21 84.3 kg   BMI:  Body mass index is 33.99 kg/m.  Estimated Nutritional Needs:  Kcal:  1700-1900 Protein:  85-100 grams Fluid:  >1.7 L  Ranell Patrick, RD, LDN Clinical  Dietitian Pager on [employer]

## 2021-06-01 LAB — GLUCOSE, CAPILLARY
Glucose-Capillary: 230 mg/dL — ABNORMAL HIGH (ref 70–99)
Glucose-Capillary: 171 mg/dL — ABNORMAL HIGH (ref 70–99)
Glucose-Capillary: 185 mg/dL — ABNORMAL HIGH (ref 70–99)
Glucose-Capillary: 157 mg/dL — ABNORMAL HIGH (ref 70–99)

## 2021-06-01 LAB — BASIC METABOLIC PANEL
Anion gap: 9 (ref 5–15)
BUN: 57 mg/dL — ABNORMAL HIGH (ref 8–23)
CO2: 28 mmol/L (ref 22–32)
Calcium: 8.8 mg/dL — ABNORMAL LOW (ref 8.9–10.3)
Chloride: 93 mmol/L — ABNORMAL LOW (ref 98–111)
Creatinine, Ser: 1.92 mg/dL — ABNORMAL HIGH (ref 0.44–1.00)
GFR, Estimated: 28 mL/min — ABNORMAL LOW (ref >60)
Glucose, Bld: 205 mg/dL — ABNORMAL HIGH (ref 70–99)
Potassium: 4 mmol/L (ref 3.5–5.1)
Sodium: 130 mmol/L — ABNORMAL LOW (ref 135–145)

## 2021-06-01 LAB — CBC
HCT: 33.2 % — ABNORMAL LOW (ref 36.0–46.0)
Hemoglobin: 10.5 g/dL — ABNORMAL LOW (ref 12.0–15.0)
MCH: 27.1 pg (ref 26.0–34.0)
MCHC: 31.6 g/dL (ref 30.0–36.0)
MCV: 85.6 fL (ref 80.0–100.0)
Platelets: 181 10*3/uL (ref 150–400)
RBC: 3.88 MIL/uL (ref 3.87–5.11)
RDW: 18.9 % — ABNORMAL HIGH (ref 11.5–15.5)
WBC: 11.2 10*3/uL — ABNORMAL HIGH (ref 4.0–10.5)
nRBC: 0 % (ref 0.0–0.2)

## 2021-06-01 LAB — AMMONIA: Ammonia: 11 umol/L (ref 9–35)

## 2021-06-01 MED ORDER — SODIUM CHLORIDE 0.9 % IV SOLN
1.0000 g | INTRAVENOUS | Status: DC
  Administered 2021-06-02: 1 g via INTRAVENOUS
  Filled 2021-06-01: qty 1
  Filled 2021-06-01: qty 10

## 2021-06-01 NOTE — Progress Notes (Signed)
Physical Therapy Treatment Patient Details Name: Crystal [name] MRN: [id number] DOB: Apr 13, 1951 Today's Date: 06/01/2021    History of Present Illness Pt is a 70 y.o. 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 CT knee showed nondisplaced fracture distal right femur with suprapatellar hemarthrosis.  Per orthopedic consult, pt to be NWB on RLE due to conservative management of fracture.    PT Comments    Pt was long sitting in bed eating breakfast upon arriving. She agrees to session and is cooperative throughout. Pt is A and O x 3. Was eager for OOB to recliner. Was able to properly state NWB restriction and was in knee immobilizer throughout session. Pt required max assist to progress from long sitting to short sit EOB. Session progress to slide board transfer to recliner. Max assist + vcs. Hoyer pad in place for assisting pt with returning to bed later this date. Pt will benefit from SNF at DC to address deficits while assisting pt to PLOF.    Follow Up Recommendations  SNF     Equipment Recommendations  Other (comment) (defer to next level of care)       Precautions / Restrictions Precautions Precautions: Fall Required Braces or Orthoses: Knee Immobilizer - Right Restrictions Weight Bearing Restrictions: Yes RLE Weight Bearing: Non weight bearing    Mobility  Bed Mobility Overal bed mobility: Needs Assistance Bed Mobility: Supine to Sit;Sit to Supine     Supine to sit: Max assist;HOB elevated     General bed mobility comments: Max assist fo one to progress from long sit in bed to short sit EOB. knee immobilizer donned throughout with pt maintaining NWB.    Transfers Overall transfer level: Needs assistance Equipment used: Sliding board Transfers: Lateral/Scoot Transfers           Lateral/Scoot Transfers: With slide board;From elevated surface;Max assist General transfer comment: max assist +1 for slideboard transfer from EOB into recliner. hoyer pad in place for assist RN staff with returning to bed this afternoon.  Ambulation/Gait    General Gait Details: unable/unsafe due to NWB restrictions     Balance Overall balance assessment: Needs assistance Sitting-balance support: Bilateral upper extremity supported Sitting balance-Leahy Scale: Fair Sitting balance - Comments: close supervision in sitting       Standing balance comment: unable to safely stand while adhering to proper wt bearing restrictions         Cognition Arousal/Alertness: Awake/alert Behavior During Therapy: WFL for tasks assessed/performed Overall Cognitive Status: No family/caregiver present to determine baseline cognitive functioning          General Comments: Pt was A and O x 3.Flat affect but cooperative and pleasant             Pertinent Vitals/Pain Pain Assessment: 0-10 Pain Score: 8  Faces Pain Scale: Hurts whole lot Pain Location: RLE with mobility but significant improvement in tolerance Pain Intervention(s): Limited activity within patient's tolerance;Monitored during session;Premedicated before session;Repositioned    Home Living                      Prior Function            PT Goals (current goals can now be found in the care plan section) Acute Rehab PT Goals Patient Stated Goal: rehab then home  Progress towards PT goals: Progressing toward goals    Frequency    7X/week      PT Plan Current plan remains appropriate    Co-evaluation              AM-PAC PT "6 Clicks" Mobility   Outcome Measure  Help needed turning from your back to your side while in a flat bed without using bedrails?: A Lot Help needed moving from lying on your back to sitting on the side of a flat bed without using bedrails?: A Lot Help needed moving to  and from a bed to a chair (including a wheelchair)?: A Lot Help needed standing up from a chair using your arms (e.g., wheelchair or bedside chair)?: Total Help needed to walk in hospital room?: Total Help needed climbing 3-5 steps with a railing? : Total 6 Click Score: 9    End of Session Equipment Utilized During Treatment: Gait belt;Other (comment) (slideboard) Activity Tolerance: Patient tolerated treatment well Patient left: in chair;with call bell/phone within reach Nurse Communication: Mobility status PT Visit Diagnosis: Unsteadiness on feet (R26.81);Other abnormalities of gait and mobility (R26.89);Pain;Difficulty in walking, not elsewhere classified (R26.2);Muscle weakness (generalized) (M62.81) Pain - Right/Left: Right Pain - part of body: Knee;Leg     Time: 2800-3491 PT Time Calculation (min) (ACUTE ONLY): 23 min  Charges:  $Therapeutic Activity: 23-37 mins                     Julaine Fusi PTA 06/01/21, 9:52 AM

## 2021-06-01 NOTE — Progress Notes (Signed)
[facility]  ROUNDING NOTE   Subjective:   Crystal [name] is a 70 y.o. female with past medical conditions including anemia, CHF, cirrhosis, diabetes, GI bleed, and CKD4. Patient has been admitted to the hospital for Morbid obesity ([place]) [E66.01] Fracture of femur, distal, right, closed ([place]) [S72.401A] Cirrhosis of liver without ascites, unspecified hepatic cirrhosis type ([place]) [K74.60] Closed nondisplaced supracondylar fracture of distal end of right femur with intracondylar extension, initial encounter ([place]) [S72.464A] Congestive heart failure, unspecified HF chronicity, unspecified heart failure type ([place]) [I50.9]   Lab Results  Component Value Date   CREATININE 2.02 (H) 05/31/2021   CREATININE 2.23 (H) 05/29/2021   CREATININE 2.28 (H) 05/28/2021   07/19 0701 - 07/20 0700 In: -  Out: 650 [Urine:650]   Patient seen laying in bed eating breakfast Alert and oriented Poor appetite this morning Denies nausea and vomiting Denies shortness of breath Patient seen later sitting up in chair   Objective:  Vital signs in last 24 hours:  Temp:  [97.9 F (36.6 C)-99.6 F (37.6 C)] 97.9 F (36.6 C) (07/20 0830) Pulse Rate:  [96-108] 105 (07/20 0830) Resp:  [17-18] 17 (07/20 0830) BP: (115-125)/(65-71) 117/66 (07/20 0830) SpO2:  [81 %-94 %] 90 % (07/20 0830)  Weight change:  Filed Weights   05/23/21 1631 05/29/21 1758  Weight: 98.8 kg 84.3 kg    Intake/Output: I/O last 3 completed shifts: In: -  Out: 650 [Urine:650]   Intake/Output this shift:  No intake/output data recorded.  Physical Exam: General: NAD, resting in bed  Head: Normocephalic, atraumatic. Moist oral mucosal membranes  Eyes: Anicteric  Lungs:  Clear to auscultation, normal breathing  Heart: Regular rate and rhythm  Abdomen:  Soft, nontender  Extremities:  no peripheral edema.  Neurologic: Nonfocal, moving all four extremities  Skin: No lesions       Basic Metabolic Panel: Recent Labs   Lab 05/26/21 0446 05/27/21 0758 05/28/21 0933 05/29/21 0431 05/31/21 0532  NA 128* 131* 129* 131* 133*  K 3.9 4.4 4.0 3.8 3.6  CL 90* 94* 92* 97* 94*  CO2 _0 GLUCOSE 137* 124* 139* 163* 148*  BUN 44* 49* 52* 58* 55*  CREATININE 2.17* 2.25* 2.28* 2.23* 2.02*  CALCIUM 8.6* 8.6* 8.7* 8.4* 8.5*  MG  --   --   --   --  2.4     Liver Function Tests: Recent Labs  Lab 05/26/21 0446 05/29/21 0431 05/31/21 0532  AST 25 25 33  ALT _1 ALKPHOS 134* 131* 124  BILITOT 1.9* 1.6* 1.4*  PROT 5.7* 5.8* 6.0*  ALBUMIN 2.0* 1.9* 1.9*    No results for input(s): LIPASE, AMYLASE in the last 168 hours. Recent Labs  Lab 05/29/21 0431 05/31/21 0532 06/01/21 0636  AMMONIA _2 CBC: Recent Labs  Lab 05/27/21 0758 05/29/21 0431 05/31/21 0532  WBC 10.8* 8.3 6.0  NEUTROABS 8.4*  --   --   HGB 10.1* 10.2* 12.3  HCT 30.9* 32.0* 39.3  MCV 83.7 84.0 83.8  PLT 135* 131* 145*     Cardiac Enzymes: No results for input(s): CKTOTAL, CKMB, CKMBINDEX, TROPONINI in the last 168 hours.  BNP: Invalid input(s): POCBNP  CBG: Recent Labs  Lab 05/31/21 0842 05/31/21 1241 05/31/21 1721 05/31/21 2143 06/01/21 0903  GLUCAP 160* 151* 146* 186* 230*     Microbiology: Results for orders placed or performed during the hospital encounter of 05/23/21  Resp Panel  by RT-PCR (Flu A&B, Covid) Nasopharyngeal Swab     Status: None   Collection Time: 05/23/21 10:50 PM   Specimen: Nasopharyngeal Swab; Nasopharyngeal(NP) swabs in vial transport medium  Result Value Ref Range Status   SARS Coronavirus 2 by RT PCR NEGATIVE NEGATIVE Final    Comment: (NOTE) SARS-CoV-2 target nucleic acids are NOT DETECTED.  The SARS-CoV-2 RNA is generally detectable in upper respiratory specimens during the acute phase of infection. The lowest concentration of SARS-CoV-2 viral copies this assay can detect is 138 copies/mL. A negative result does not preclude SARS-Cov-2 infection and  should not be used as the sole basis for treatment or other patient management decisions. A negative result may occur with  improper specimen collection/handling, submission of specimen other than nasopharyngeal swab, presence of viral mutation(s) within the areas targeted by this assay, and inadequate number of viral copies(<138 copies/mL). A negative result must be combined with clinical observations, patient history, and epidemiological information. The expected result is Negative.  Fact Sheet for Patients:  EntrepreneurPulse.com.au  Fact Sheet for Healthcare Providers:  IncredibleEmployment.be  This test is no t yet approved or cleared by the [place] FDA and  has been authorized for detection and/or diagnosis of SARS-CoV-2 by FDA under an Emergency Use Authorization (EUA). This EUA will remain  in effect (meaning this test can be used) for the duration of the COVID-19 declaration under Section 564(b)(1) of the Act, 21 U.S.C.section 360bbb-3(b)(1), unless the authorization is terminated  or revoked sooner.       Influenza A by PCR NEGATIVE NEGATIVE Final   Influenza B by PCR NEGATIVE NEGATIVE Final    Comment: (NOTE) The Xpert Xpress SARS-CoV-2/FLU/RSV plus assay is intended as an aid in the diagnosis of influenza from Nasopharyngeal swab specimens and should not be used as a sole basis for treatment. Nasal washings and aspirates are unacceptable for Xpert Xpress SARS-CoV-2/FLU/RSV testing.  Fact Sheet for Patients: EntrepreneurPulse.com.au  Fact Sheet for Healthcare Providers: IncredibleEmployment.be  This test is not yet approved or cleared by the [place] FDA and has been authorized for detection and/or diagnosis of SARS-CoV-2 by FDA under an Emergency Use Authorization (EUA). This EUA will remain in effect (meaning this test can be used) for the duration of the COVID-19 declaration  under Section 564(b)(1) of the Act, 21 U.S.C. section 360bbb-3(b)(1), unless the authorization is terminated or revoked.  Performed at [facility], [address] [place], [place] [address]   Culture, blood (Routine X 2) w Reflex to ID Panel     Status: None (Preliminary result)   Collection Time: 05/28/21  6:00 PM   Specimen: BLOOD  Result Value Ref Range Status   Specimen Description BLOOD BLOOD LEFT FOREARM  Final   Special Requests   Final    BOTTLES DRAWN AEROBIC AND ANAEROBIC Blood Culture results may not be optimal due to an inadequate volume of blood received in culture bottles   Culture   Final    NO GROWTH 4 DAYS Performed at [facility], [address], [place], [place] [address]    Report Status PENDING  Incomplete  Culture, blood (Routine X 2) w Reflex to ID Panel     Status: None (Preliminary result)   Collection Time: 05/28/21  6:00 PM   Specimen: BLOOD  Result Value Ref Range Status   Specimen Description BLOOD BLOOD LEFT HAND  Final   Special Requests   Final    BOTTLES DRAWN AEROBIC AND ANAEROBIC Blood Culture results  may not be optimal due to an inadequate volume of blood received in culture bottles   Culture   Final    NO GROWTH 4 DAYS Performed at [facility], [address] [place], [place] [address]    Report Status PENDING  Incomplete  Urine Culture     Status: Abnormal   Collection Time: 05/28/21 10:10 PM   Specimen: Urine, Random  Result Value Ref Range Status   Specimen Description   Final    URINE, RANDOM Performed at [facility], [address], [place], [place] [address]    Special Requests   Final    NONE Performed at [facility], [address] [place], [place] [address]    Culture (A)  Final    10,000 COLONIES/mL MULTIPLE SPECIES PRESENT, SUGGEST RECOLLECTION   Report Status 05/30/2021 FINAL  Final  MRSA Next Gen by PCR, Nasal     Status: None   Collection Time: 05/29/21  6:04 PM    Specimen: Nasal Mucosa; Nasal Swab  Result Value Ref Range Status   MRSA by PCR Next Gen NOT DETECTED NOT DETECTED Final    Comment: (NOTE) The GeneXpert MRSA Assay (FDA approved for NASAL specimens only), is one component of a comprehensive MRSA colonization surveillance program. It is not intended to diagnose MRSA infection nor to guide or monitor treatment for MRSA infections. Test performance is not FDA approved in patients less than 48 years old. Performed at [facility], [address] [place], [place] [address]     Coagulation Studies: No results for input(s): LABPROT, INR in the last 72 hours.  Urinalysis: No results for input(s): COLORURINE, LABSPEC, PHURINE, GLUCOSEU, HGBUR, BILIRUBINUR, KETONESUR, PROTEINUR, UROBILINOGEN, NITRITE, LEUKOCYTESUR in the last 72 hours.  Invalid input(s): APPERANCEUR     Imaging: EEG adult  Result Date: 05/30/2021 Greta Doom, MD     05/30/2021  3:00 PM History: 70 yo F with AMS Sedation: None Technique: This is a 21 channel routine scalp EEG performed at the bedside with bipolar and monopolar montages arranged in accordance to the international 10/20 system of electrode placement. One channel was dedicated to EKG recording. Background: There is a well-formed posterior dominant rhythm of 8 Hz which is reactive to eye opening/closure.  In addition there is intrusion into the background of irregular low voltage delta and theta activity even during maximal wakefulness.  With drowsiness, there is prominent frontally predominant intermittent rhythmic delta activity(FIRDA). Photic stimulation: Physiologic driving is not performed EEG Abnormalities: 1) FIRDA 2) Generalized irregular slow activity. Clinical Interpretation: This EEG is consistent with a mild generalized nonspecific cerebral dysfunction (encephalopathy). There was no seizure or seizure predisposition recorded on this study. Please note that lack of epileptiform  activity on EEG does not preclude the possibility of epilepsy. Roland Rack, MD [facility] [phone] If 7pm- 7am, please page neurology on call as listed in [place].     Medications:    cefTRIAXone (ROCEPHIN)  IV 1 g (05/31/21 1732)    (feeding supplement) PROSource Plus  30 mL Oral TID BM   Chlorhexidine Gluconate Cloth  6 each Topical Q0600   feeding supplement (GLUCERNA SHAKE)  237 mL Oral TID BM   heparin injection (subcutaneous)  5,000 Units Subcutaneous Q8H   heparin lock flush  500 Units Intracatheter Q30 days   insulin aspart  0-9 Units Subcutaneous TID WC   lactulose  30 g Oral TID   multivitamin with minerals  1 tablet Oral Daily  pantoprazole  40 mg Oral BID   rifaximin  550 mg Oral BID   spironolactone  100 mg Oral Daily   acetaminophen, albuterol, benzonatate, guaiFENesin-dextromethorphan, heparin lock flush **AND** heparin lock flush, lactulose, morphine injection, ondansetron **OR** ondansetron (ZOFRAN) IV, polyvinyl alcohol  Assessment/ Plan:  Ms. Crystal [name] is a 70 y.o.  female with past medical conditions including anemia, CHF, non alcoholic cirrhosis, h/o TIPS, diabetes, h/o GI bleed, and CKD4. Patient has been admitted to the hospital for Morbid obesity ([place]) [E66.01] Fracture of femur, distal, right, closed ([place]) [S72.401A] Cirrhosis of liver without ascites, unspecified hepatic cirrhosis type ([place]) [K74.60] Closed nondisplaced supracondylar fracture of distal end of right femur with intracondylar extension, initial encounter ([place]) [S72.464A] Congestive heart failure, unspecified HF chronicity, unspecified heart failure type ([place]) [I50.9]   Acute Kidney Injury on chronic kidney disease stage 4 with baseline creatinine 1.85 and GFR of 29 on 05/25/2021.  Renal ultrasound neg for hydronephrosis (05/27/21) IV contrast exposure on 05/20/21 during cardiac cath Creatinine continues improving Awaiting morning labs.  At discharge, will return to  office for outpatient followup  Lab Results  Component Value Date   CREATININE 2.02 (H) 05/31/2021   CREATININE 2.23 (H) 05/29/2021   CREATININE 2.28 (H) 05/28/2021    Intake/Output Summary (Last 24 hours) at 06/01/2021 0955 Last data filed at 05/31/2021 1300 Gross per 24 hour  Intake --  Output 650 ml  Net -650 ml     2. Anemia of chronic kidney disease  Lab Results  Component Value Date   HGB 12.3 05/31/2021   Hgb at goal   3. Chronic hyponatremia Likely due to underlying cirrhosis Na level within patient range (ranges 128-139 for her) Continue to encourage oral intake If levels drop, would consider IVF   4. Admitted post mechanical fall resulting in closed non displaced right femur fracture with supra patellar hemarthrosis   LOS: 9   7/20/20229:55 AM

## 2021-06-01 NOTE — Progress Notes (Signed)
PROGRESS NOTE    Crystal [name]  UJW:[id number] DOB: [birth year] DOA: 05/23/2021 PCP: Ileene Hutchinson, PA    Chief Complaint  Patient presents with   Fall    Brief Narrative:  Crystal [name] is a 70 y.o. 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   Patient usually ambulates with a walker, but was using her cane and tripped over her shoe.  No loss of consciousness, no preceding chest pain, palpitations, dizziness.     In the ER, CT showed nondisplaced femur fracture, distal right femur with suprapatellar hemarthrosis.  Orthopedics were consulted and the hospitalist service were asked to evaluate. Pt seen and examined at bedside, she is alert and oriented, eating breakfast by herself. Denies any new complaints.   Assessment & Plan:   Principal Problem:   Fracture of femur, distal, right, closed ([place]) Active Problems:   S/P TIPS (transjugular intrahepatic portosystemic shunt)   Coronary artery disease involving native coronary artery of native heart without angina pectoris   Liver cirrhosis secondary to NASH (HCC)   Chronic diastolic CHF (congestive heart failure) (HCC)   Diabetes mellitus, type 2 (HCC)   Chronic kidney disease, stage IV (severe) (HCC)   Closed fracture of distal end of right femur ([place])   Accidental fall   Pressure injury of skin   Mechanical fall leading to distal femur fracture on the right -Orthopedics consulted recommended nonsurgical management at this time. Therapy evaluations recommending SNF.  TOC on board, palliative care consulted for goals of care, after discussing with family they wanted full scope of treatment.  Pain control.    Urinary tract infection;  Started empirically on IV antibiotics, follow urine and blood cultures and transition to oral antibiotics as appropriate.  Urine cultures showed 10,000 colonies, but since she is improving clinically, we will continue the  antibiotics for 7 days.  She continues to improve clinically, answering questions appropriately.   Acute hepatic encephalopathy and Acute metabolic encephalopathy:  Initially Improved with lactulose enemas. Ammonia levels normalized.  MRI brain without contrast is negative for acute stroke.  Avoid narcotic pain medications as she remained lethargic for atleast 24 hours from one dose of oxycodone.  She was also found to have an UTI , blood cultures are negative, urine cultures show  10,000 multiple species present.  She was empirically started on IV antibiotics, with improvement in the mental status, recommend to complete the course of antibiotics.  Therapy evaluations recommending SNF.     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 US abdomen is negative for ascites.  Rifaximin added to her regimen We had to hold the lasix as pt has minimal oral intake. Continue with spironolactone.    Acute on Stage IV CKD Slight worsening of creatinine from 1.8 to 2.2 and plateaued at 2 . IV contrast exposure earlier this month on 05/20/21 for cardiac cath.  Appreciate Nephrology input.     Chronic diastolic heart failure Appears compensated at this time Patient currently denies any shortness of breath or chest pain but lower extremity edema present.    Hypokalemia, K replaced and resolved     Chronic hyponatremia.  Suspect a combination of cirrhosis and diuretic use. serum osmolarity is 277  Na 130 Continue to monitor.    Anemia of chronic disease No bleeding evident. Hemoglobin around 10.  Transfuse to keep hemoglobin greater than  7.    Thrombocytopenia, resolved plt count  181k Probably secondary to cirrhosis, no bleeding .  Continue to monitor.   Diabetes mellitus, non insulin dependent.  CBG (last 3)  Recent Labs    05/31/21 2143 06/01/21 0903 06/01/21 1219  GLUCAP 186* 230* 171*   Continue with sliding  scale insulin . No changes in meds.  Hemoglobin A1c is 5.6.   Bilateral lower extremity edema;  Venous duplex of the lower extremity show non occlusive DVT in the popliteal vein, unable to anti coagulate fully due to h/o of GI bleed requiring hospitalization and 3 units of prbc transfusion earlier this year. She has an IVC Filter in place.  Dicussed the findings with the patient and daughter  and in agreement with the plan.    Sub conjunctival hemorrhage:  Supportive management, she denies any pain, or blurry vision, she reports feeling rubbery in the right eye.    Pressure injury present on admission.  Pressure Injury 05/29/21 Thigh Posterior;Proximal;Right Stage 2 -  Partial thickness loss of dermis presenting as a shallow open injury with a red, pink wound bed without slough. (Active)  05/29/21 1837  Location: Thigh  Location Orientation: Posterior;Proximal;Right  Staging: Stage 2 -  Partial thickness loss of dermis presenting as a shallow open injury with a red, pink wound bed without slough.  Wound Description (Comments):   Present on Admission: Yes (present upon admission to icu)     Pressure Injury 05/29/21 Coccyx Medial Stage 1 -  Intact skin with non-blanchable redness of a localized area usually over a bony prominence. (Active)  05/29/21 1837  Location: Coccyx  Location Orientation: Medial  Staging: Stage 1 -  Intact skin with non-blanchable redness of a localized area usually over a bony prominence.  Wound Description (Comments):   Present on Admission: Yes (present upon admission to icu)  Will request wound care consult.         DVT prophylaxis: Heparin sq. Code Status: FULL CODE Family Communication: none at bedside.  Discussion was done with patient's daughter over the phone by previous attending.    Disposition:   Status is: Inpatient  Remains inpatient appropriate because:Ongoing diagnostic testing needed not appropriate for outpatient work up, Unsafe d/c  plan, and IV treatments appropriate due to intensity of illness or inability to take PO  Dispo: The patient is from: Home              Anticipated d/c is to: SNF              Patient currently is medically stable to d/c.   Difficult to place patient No       Consultants:  Orthopedics   Procedures: none.   Antimicrobials: ( Antibiotics Given (last 72 hours)     Date/Time Action Medication Dose Rate   05/29/21 2023 Given   rifaximin (XIFAXAN) tablet 550 mg 550 mg    05/30/21 0852 Given   rifaximin (XIFAXAN) tablet 550 mg 550 mg    05/30/21 1644 New Bag/Given   cefTRIAXone (ROCEPHIN) 1 g in sodium chloride 0.9 % 100 mL IVPB 1 g 200 mL/hr   05/30/21 2115 Given   rifaximin (XIFAXAN) tablet 550 mg 550 mg    05/31/21 1023 Given   rifaximin (XIFAXAN) tablet 550 mg 550 mg    05/31/21 1732 New Bag/Given   cefTRIAXone (ROCEPHIN) 1 g in sodium chloride 0.9 % 100 mL IVPB 1 g 200 mL/hr   05/31/21 2257 Given   rifaximin (XIFAXAN) tablet  550 mg 550 mg    06/01/21 1001 Given   rifaximin (XIFAXAN) tablet 550 mg 550 mg    06/01/21 1544 New Bag/Given   cefTRIAXone (ROCEPHIN) 1 g in sodium chloride 0.9 % 100 mL IVPB 1 g 200 mL/hr         Subjective: No overnight issues, patient still has pain in the right leg 10/10, she is taking pain medications.  AAOx3, denied any other active issues.  Objective: Vitals:   05/31/21 2021 06/01/21 0025 06/01/21 0425 06/01/21 0830  BP: 124/71 118/68 125/70 117/66  Pulse: (!) 102 (!) 108 (!) 105 (!) 105  Resp: _0 Temp: 98.7 F (37.1 C) 98.8 F (37.1 C) 99.6 F (37.6 C) 97.9 F (36.6 C)  TempSrc:      SpO2: 94% 93% 91% 90%  Weight:      Height:        Intake/Output Summary (Last 24 hours) at 06/01/2021 1607 Last data filed at 06/01/2021 1126 Gross per 24 hour  Intake --  Output 400 ml  Net -400 ml   Filed Weights   05/23/21 1631 05/29/21 1758  Weight: 98.8 kg 84.3 kg    Examination:  General exam: Elderly woman , on RA  eating breakfast by herself, not in distress.  Respiratory system: Clear to auscultation, no wheezing heard. On RA.  Cardiovascular system: RRR, no JVD, pedal edema present.  Gastrointestinal system: Abdomen is soft, non tender non distended bowel sounds wnl.  Central nervous system: Alert and oriented, non focal.  Extremities: mild edema, RLE knee immobilizer intact Skin: stage 1 pressure on coccyx.  Psychiatry: Mood is appropriate.     Data Reviewed: I have personally reviewed following labs and imaging studies  CBC: Recent Labs  Lab 05/27/21 0758 05/29/21 0431 05/31/21 0532 06/01/21 1206  WBC 10.8* 8.3 6.0 11.2*  NEUTROABS 8.4*  --   --   --   HGB 10.1* 10.2* 12.3 10.5*  HCT 30.9* 32.0* 39.3 33.2*  MCV 83.7 84.0 83.8 85.6  PLT 135* 131* 145* 956    Basic Metabolic Panel: Recent Labs  Lab 05/27/21 0758 05/28/21 0933 05/29/21 0431 05/31/21 0532 06/01/21 1206  NA 131* 129* 131* 133* 130*  K 4.4 4.0 3.8 3.6 4.0  CL 94* 92* 97* 94* 93*  CO2 _1 GLUCOSE 124* 139* 163* 148* 205*  BUN 49* 52* 58* 55* 57*  CREATININE 2.25* 2.28* 2.23* 2.02* 1.92*  CALCIUM 8.6* 8.7* 8.4* 8.5* 8.8*  MG  --   --   --  2.4  --     GFR: Estimated Creatinine Clearance: 27.9 mL/min (A) (by C-G formula based on SCr of 1.92 mg/dL (H)).  Liver Function Tests: Recent Labs  Lab 05/26/21 0446 05/29/21 0431 05/31/21 0532  AST 25 25 33  ALT _2 ALKPHOS 134* 131* 124  BILITOT 1.9* 1.6* 1.4*  PROT 5.7* 5.8* 6.0*  ALBUMIN 2.0* 1.9* 1.9*    CBG: Recent Labs  Lab 05/31/21 1241 05/31/21 1721 05/31/21 2143 06/01/21 0903 06/01/21 1219  GLUCAP 151* 146* 186* 230* 171*     Recent Results (from the past 240 hour(s))  Resp Panel by RT-PCR (Flu A&B, Covid) Nasopharyngeal Swab     Status: None   Collection Time: 05/23/21 10:50 PM   Specimen: Nasopharyngeal Swab; Nasopharyngeal(NP) swabs in vial transport medium  Result Value Ref Range Status   SARS Coronavirus 2 by RT  PCR NEGATIVE NEGATIVE Final  Comment: (NOTE) SARS-CoV-2 target nucleic acids are NOT DETECTED.  The SARS-CoV-2 RNA is generally detectable in upper respiratory specimens during the acute phase of infection. The lowest concentration of SARS-CoV-2 viral copies this assay can detect is 138 copies/mL. A negative result does not preclude SARS-Cov-2 infection and should not be used as the sole basis for treatment or other patient management decisions. A negative result may occur with  improper specimen collection/handling, submission of specimen other than nasopharyngeal swab, presence of viral mutation(s) within the areas targeted by this assay, and inadequate number of viral copies(<138 copies/mL). A negative result must be combined with clinical observations, patient history, and epidemiological information. The expected result is Negative.  Fact Sheet for Patients:  EntrepreneurPulse.com.au  Fact Sheet for Healthcare Providers:  IncredibleEmployment.be  This test is no t yet approved or cleared by the [place] FDA and  has been authorized for detection and/or diagnosis of SARS-CoV-2 by FDA under an Emergency Use Authorization (EUA). This EUA will remain  in effect (meaning this test can be used) for the duration of the COVID-19 declaration under Section 564(b)(1) of the Act, 21 U.S.C.section 360bbb-3(b)(1), unless the authorization is terminated  or revoked sooner.       Influenza A by PCR NEGATIVE NEGATIVE Final   Influenza B by PCR NEGATIVE NEGATIVE Final    Comment: (NOTE) The Xpert Xpress SARS-CoV-2/FLU/RSV plus assay is intended as an aid in the diagnosis of influenza from Nasopharyngeal swab specimens and should not be used as a sole basis for treatment. Nasal washings and aspirates are unacceptable for Xpert Xpress SARS-CoV-2/FLU/RSV testing.  Fact Sheet for Patients: EntrepreneurPulse.com.au  Fact Sheet for  Healthcare Providers: IncredibleEmployment.be  This test is not yet approved or cleared by the [place] FDA and has been authorized for detection and/or diagnosis of SARS-CoV-2 by FDA under an Emergency Use Authorization (EUA). This EUA will remain in effect (meaning this test can be used) for the duration of the COVID-19 declaration under Section 564(b)(1) of the Act, 21 U.S.C. section 360bbb-3(b)(1), unless the authorization is terminated or revoked.  Performed at [facility], [address] [place], [place] [address]   Culture, blood (Routine X 2) w Reflex to ID Panel     Status: None (Preliminary result)   Collection Time: 05/28/21  6:00 PM   Specimen: BLOOD  Result Value Ref Range Status   Specimen Description BLOOD BLOOD LEFT FOREARM  Final   Special Requests   Final    BOTTLES DRAWN AEROBIC AND ANAEROBIC Blood Culture results may not be optimal due to an inadequate volume of blood received in culture bottles   Culture   Final    NO GROWTH 4 DAYS Performed at [facility], [address], [place], [place] [address]    Report Status PENDING  Incomplete  Culture, blood (Routine X 2) w Reflex to ID Panel     Status: None (Preliminary result)   Collection Time: 05/28/21  6:00 PM   Specimen: BLOOD  Result Value Ref Range Status   Specimen Description BLOOD BLOOD LEFT HAND  Final   Special Requests   Final    BOTTLES DRAWN AEROBIC AND ANAEROBIC Blood Culture results may not be optimal due to an inadequate volume of blood received in culture bottles   Culture   Final    NO GROWTH 4 DAYS Performed at [facility], [address] [place], [place] [address]    Report Status PENDING  Incomplete  Urine Culture  Status: Abnormal   Collection Time: 05/28/21 10:10 PM   Specimen: Urine, Random  Result Value Ref Range Status   Specimen Description   Final    URINE, RANDOM Performed at [facility], [address] [place], [place] [address]    Special Requests   Final    NONE Performed at [facility], [address] [place], [place] [address]    Culture (A)  Final    10,000 COLONIES/mL MULTIPLE SPECIES PRESENT, SUGGEST RECOLLECTION   Report Status 05/30/2021 FINAL  Final  MRSA Next Gen by PCR, Nasal     Status: None   Collection Time: 05/29/21  6:04 PM   Specimen: Nasal Mucosa; Nasal Swab  Result Value Ref Range Status   MRSA by PCR Next Gen NOT DETECTED NOT DETECTED Final    Comment: (NOTE) The GeneXpert MRSA Assay (FDA approved for NASAL specimens only), is one component of a comprehensive MRSA colonization surveillance program. It is not intended to diagnose MRSA infection nor to guide or monitor treatment for MRSA infections. Test performance is not FDA approved in patients less than 17 years old. Performed at [facility], [address] [place], [place] [address]           Radiology Studies: No results found.      Scheduled Meds:  (feeding supplement) PROSource Plus  30 mL Oral TID BM   Chlorhexidine Gluconate Cloth  6 each Topical Q0600   feeding supplement (GLUCERNA SHAKE)  237 mL Oral TID BM   heparin injection (subcutaneous)  5,000 Units Subcutaneous Q8H   heparin lock flush  500 Units Intracatheter Q30 days   insulin aspart  0-9 Units Subcutaneous TID WC   lactulose  30 g Oral TID   multivitamin with minerals  1 tablet Oral Daily   pantoprazole  40 mg Oral BID   rifaximin  550 mg Oral BID   spironolactone  100 mg Oral Daily   Continuous Infusions:  cefTRIAXone (ROCEPHIN)  IV 1 g (06/01/21 1544)     LOS: 9 days        Val Riles, MD [facility]   To contact the attending provider between 7A-7P or the covering provider during after hours 7P-7A, please log into the web site [email or web address] and access using universal [place] password for that web site. If you do not have the password, please call the hospital  operator.  06/01/2021, 4:07 PM

## 2021-06-02 LAB — CBC
HCT: 31 % — ABNORMAL LOW (ref 36.0–46.0)
Hemoglobin: 9.7 g/dL — ABNORMAL LOW (ref 12.0–15.0)
MCH: 26.6 pg (ref 26.0–34.0)
MCHC: 31.3 g/dL (ref 30.0–36.0)
MCV: 84.9 fL (ref 80.0–100.0)
Platelets: 143 10*3/uL — ABNORMAL LOW (ref 150–400)
RBC: 3.65 MIL/uL — ABNORMAL LOW (ref 3.87–5.11)
RDW: 18.7 % — ABNORMAL HIGH (ref 11.5–15.5)
WBC: 8.3 10*3/uL (ref 4.0–10.5)
nRBC: 0 % (ref 0.0–0.2)

## 2021-06-02 LAB — GLUCOSE, CAPILLARY
Glucose-Capillary: 145 mg/dL — ABNORMAL HIGH (ref 70–99)
Glucose-Capillary: 215 mg/dL — ABNORMAL HIGH (ref 70–99)
Glucose-Capillary: 185 mg/dL — ABNORMAL HIGH (ref 70–99)
Glucose-Capillary: 135 mg/dL — ABNORMAL HIGH (ref 70–99)
Glucose-Capillary: 166 mg/dL — ABNORMAL HIGH (ref 70–99)

## 2021-06-02 LAB — IRON AND TIBC
Iron: 31 ug/dL (ref 28–170)
Saturation Ratios: 19 % (ref 10.4–31.8)
TIBC: 165 ug/dL — ABNORMAL LOW (ref 250–450)
UIBC: 134 ug/dL

## 2021-06-02 LAB — CULTURE, BLOOD (ROUTINE X 2)
Culture: NO GROWTH
Culture: NO GROWTH

## 2021-06-02 LAB — BASIC METABOLIC PANEL
Anion gap: 11 (ref 5–15)
BUN: 59 mg/dL — ABNORMAL HIGH (ref 8–23)
CO2: 28 mmol/L (ref 22–32)
Calcium: 8.8 mg/dL — ABNORMAL LOW (ref 8.9–10.3)
Chloride: 94 mmol/L — ABNORMAL LOW (ref 98–111)
Creatinine, Ser: 1.95 mg/dL — ABNORMAL HIGH (ref 0.44–1.00)
GFR, Estimated: 27 mL/min — ABNORMAL LOW (ref >60)
Glucose, Bld: 174 mg/dL — ABNORMAL HIGH (ref 70–99)
Potassium: 3.6 mmol/L (ref 3.5–5.1)
Sodium: 133 mmol/L — ABNORMAL LOW (ref 135–145)

## 2021-06-02 LAB — PHOSPHORUS: Phosphorus: 3.2 mg/dL (ref 2.5–4.6)

## 2021-06-02 LAB — VITAMIN D 25 HYDROXY (VIT D DEFICIENCY, FRACTURES): Vit D, 25-Hydroxy: 57.85 ng/mL (ref 30–100)

## 2021-06-02 LAB — FOLATE: Folate: 11.4 ng/mL (ref >5.9)

## 2021-06-02 LAB — VITAMIN B12: Vitamin B-12: 1783 pg/mL — ABNORMAL HIGH (ref 180–914)

## 2021-06-02 LAB — SARS CORONAVIRUS 2 (TAT 6-24 HRS): SARS Coronavirus 2: NEGATIVE

## 2021-06-02 LAB — AMMONIA: Ammonia: <9 umol/L — ABNORMAL LOW (ref 9–35)

## 2021-06-02 LAB — MAGNESIUM: Magnesium: 3 mg/dL — ABNORMAL HIGH (ref 1.7–2.4)

## 2021-06-02 MED ORDER — POTASSIUM CHLORIDE CRYS ER 20 MEQ PO TBCR: 20.0000 meq | EXTENDED_RELEASE_TABLET | Freq: Every day | ORAL | 3 refills | Status: DC

## 2021-06-02 MED ORDER — OXYCODONE HCL 5 MG PO TABS
5.0000 mg | ORAL_TABLET | Freq: Four times a day (QID) | ORAL | Status: DC | PRN
  Administered 2021-06-02 – 2021-06-03 (×4): 5 mg via ORAL
  Filled 2021-06-02 (×4): qty 1

## 2021-06-02 MED ORDER — SPIRONOLACTONE 50 MG PO TABS: 50.0000 mg | ORAL_TABLET | Freq: Every day | ORAL | 0 refills | Status: DC

## 2021-06-02 MED ORDER — OXYCODONE HCL 5 MG PO TABS: 5.0000 mg | ORAL_TABLET | Freq: Four times a day (QID) | ORAL | 0 refills | Status: DC | PRN

## 2021-06-02 MED ORDER — FUROSEMIDE 80 MG PO TABS: 40.0000 mg | ORAL_TABLET | Freq: Every day | ORAL | 1 refills | Status: DC

## 2021-06-02 MED ORDER — ACETAMINOPHEN 500 MG PO TABS: 500.0000 mg | ORAL_TABLET | Freq: Four times a day (QID) | ORAL | 0 refills | Status: DC | PRN

## 2021-06-02 MED ORDER — AMOXICILLIN-POT CLAVULANATE 875-125 MG PO TABS: 1.0000 | ORAL_TABLET | Freq: Two times a day (BID) | ORAL | 0 refills | Status: AC

## 2021-06-02 NOTE — TOC Progression Note (Signed)
Transition of Care [facility]) - Progression Note    Patient Details  Name: CARYLE HELGESON MRN: [id number] Date of Birth: December 04, 1950  Transition of Care [facility]) CM/SW Contact  Su Hilt, RN Phone Number: 06/02/2021, 1:57 PM  Clinical Narrative:     Hulen Skains to [facility] and asked to speak with Jackelyn Poling, she was to call back earlier today to confirm if the patient could come today, Was unable to reach, left a secure VM requesting a call back, The Covid nasal swab is pending results  Expected Discharge Plan: Skilled Nursing Facility Barriers to Discharge: Continued Medical Work up  Expected Discharge Plan and Services Expected Discharge Plan: [place]   Discharge Planning Services: CM Consult     Expected Discharge Date: 06/02/21                                     Social Determinants of Health (SDOH) Interventions    Readmission Risk Interventions Readmission Risk Prevention Plan 04/28/2021 04/07/2021  Transportation Screening Complete Complete  Medication Review (RN Care Manager) Complete Complete  PCP or Specialist appointment within 3-5 days of discharge - Complete  HRI or Home Care Consult Complete Complete  SW Recovery Care/Counseling Consult - Complete  Palliative Care Screening Not Applicable Not [place] Not Applicable Not Applicable  Some recent data might be hidden

## 2021-06-02 NOTE — Progress Notes (Signed)
PROGRESS NOTE    Crystal [name]  ZOX:[id number] DOB: February 18, 1951 DOA: 05/23/2021 PCP: Ileene Hutchinson, PA    Chief Complaint  Patient presents with   Fall    Brief Narrative:  Crystal [name] is a 70 y.o. 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   Patient usually ambulates with a walker, but was using her cane and tripped over her shoe.  No loss of consciousness, no preceding chest pain, palpitations, dizziness.     In the ER, CT showed nondisplaced femur fracture, distal right femur with suprapatellar hemarthrosis.  Orthopedics were consulted and the hospitalist service were asked to evaluate. Pt seen and examined at bedside, she is alert and oriented, eating breakfast by herself. Denies any new complaints.   Assessment & Plan:   Principal Problem:   Fracture of femur, distal, right, closed ([place]) Active Problems:   S/P TIPS (transjugular intrahepatic portosystemic shunt)   Coronary artery disease involving native coronary artery of native heart without angina pectoris   Liver cirrhosis secondary to NASH (HCC)   Chronic diastolic CHF (congestive heart failure) (HCC)   Diabetes mellitus, type 2 (HCC)   Chronic kidney disease, stage IV (severe) (HCC)   Closed fracture of distal end of right femur ([place])   Accidental fall   Pressure injury of skin   Mechanical fall leading to distal femur fracture on the right -Orthopedics consulted recommended nonsurgical management at this time. Therapy evaluations recommending SNF.  TOC on board, palliative care consulted for goals of care, after discussing with family they wanted full scope of treatment.  Pain control.    Urinary tract infection;  Started empirically on IV antibiotics, follow urine and blood cultures and transition to oral antibiotics as appropriate.  Urine cultures showed 10,000 colonies, but since she is improving clinically, we will continue the  antibiotics for 7 days.  She continues to improve clinically, answering questions appropriately.   Acute hepatic encephalopathy and Acute metabolic encephalopathy:  Initially Improved with lactulose enemas. Ammonia levels normalized.  MRI brain without contrast is negative for acute stroke.  Avoid narcotic pain medications as she remained lethargic for atleast 24 hours from one dose of oxycodone.  She was also found to have an UTI , blood cultures are negative, urine cultures show  10,000 multiple species present.  She was empirically started on IV antibiotics, with improvement in the mental status, recommend to complete the course of antibiotics.  Therapy evaluations recommending SNF.     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 US abdomen is negative for ascites.  Rifaximin added to her regimen We had to hold the lasix as pt has minimal oral intake. Continue with spironolactone.    Acute on Stage IV CKD Slight worsening of creatinine from 1.8 to 2.2 and plateaued at 2 . IV contrast exposure earlier this month on 05/20/21 for cardiac cath.  Appreciate Nephrology input.     Chronic diastolic heart failure Appears compensated at this time Patient currently denies any shortness of breath or chest pain but lower extremity edema present.    Hypokalemia, K replaced and resolved     Chronic hyponatremia.  Suspect a combination of cirrhosis and diuretic use. serum osmolarity is 277  Na 130 Continue to monitor.    Anemia of chronic disease No bleeding evident. Hemoglobin around 10.  Transfuse to keep hemoglobin greater than  7.    Thrombocytopenia, resolved plt count  181k Probably secondary to cirrhosis, no bleeding .  Continue to monitor.   Diabetes mellitus, non insulin dependent.  CBG (last 3)  Recent Labs    06/01/21 2127 06/02/21 0809 06/02/21 1138  GLUCAP 157* 145* 215*   Continue with sliding  scale insulin . No changes in meds.  Hemoglobin A1c is 5.6.   Bilateral lower extremity edema;  Venous duplex of the lower extremity show non occlusive DVT in the popliteal vein, unable to anti coagulate fully due to h/o of GI bleed requiring hospitalization and 3 units of prbc transfusion earlier this year. She has an IVC Filter in place.  Dicussed the findings with the patient and daughter  and in agreement with the plan.    Sub conjunctival hemorrhage:  Supportive management, she denies any pain, or blurry vision, she reports feeling rubbery in the right eye.    Pressure injury present on admission.  Pressure Injury 05/29/21 Thigh Posterior;Proximal;Right Stage 2 -  Partial thickness loss of dermis presenting as a shallow open injury with a red, pink wound bed without slough. (Active)  05/29/21 1837  Location: Thigh  Location Orientation: Posterior;Proximal;Right  Staging: Stage 2 -  Partial thickness loss of dermis presenting as a shallow open injury with a red, pink wound bed without slough.  Wound Description (Comments):   Present on Admission: Yes (present upon admission to icu)     Pressure Injury 05/29/21 Coccyx Medial Stage 1 -  Intact skin with non-blanchable redness of a localized area usually over a bony prominence. (Active)  05/29/21 1837  Location: Coccyx  Location Orientation: Medial  Staging: Stage 1 -  Intact skin with non-blanchable redness of a localized area usually over a bony prominence.  Wound Description (Comments):   Present on Admission: Yes (present upon admission to icu)  Will request wound care consult.         DVT prophylaxis: Heparin sq. Code Status: FULL CODE Family Communication: none at bedside.  Discussion was done with patient's daughter over the phone by previous attending.    Disposition:   Status is: Inpatient  Remains inpatient appropriate because:Ongoing diagnostic testing needed not appropriate for outpatient work up, Unsafe d/c  plan, and IV treatments appropriate due to intensity of illness or inability to take PO  Dispo: The patient is from: Home              Anticipated d/c is to: SNF              Patient currently is medically stable to d/c.   Difficult to place patient No       Consultants:  Orthopedics   Procedures: none.   Antimicrobials: ( Antibiotics Given (last 72 hours)     Date/Time Action Medication Dose Rate   05/30/21 1644 New Bag/Given   cefTRIAXone (ROCEPHIN) 1 g in sodium chloride 0.9 % 100 mL IVPB 1 g 200 mL/hr   05/30/21 2115 Given   rifaximin (XIFAXAN) tablet 550 mg 550 mg    05/31/21 1023 Given   rifaximin (XIFAXAN) tablet 550 mg 550 mg    05/31/21 1732 New Bag/Given   cefTRIAXone (ROCEPHIN) 1 g in sodium chloride 0.9 % 100 mL IVPB 1 g 200 mL/hr   05/31/21 2257 Given   rifaximin (XIFAXAN) tablet 550 mg 550 mg    06/01/21 1001 Given   rifaximin (XIFAXAN) tablet 550 mg 550 mg    06/01/21 1544 New Bag/Given   cefTRIAXone (ROCEPHIN)  1 g in sodium chloride 0.9 % 100 mL IVPB 1 g 200 mL/hr   06/01/21 2123 Given   rifaximin (XIFAXAN) tablet 550 mg 550 mg    06/02/21 0857 Given   rifaximin (XIFAXAN) tablet 550 mg 550 mg          Subjective: No overnight issues, complaining of pain 10 out of 10, it seems patient is not taking as needed medication.,  Started on oxycodone p.o. as needed.  Patient denied any other active issues.   Objective: Vitals:   06/01/21 2029 06/02/21 0615 06/02/21 0806 06/02/21 1144  BP: 111/67 122/64 120/62 (!) 127/58  Pulse: 98 78 73 82  Resp: _0 Temp: 98.2 F (36.8 C) 98 F (36.7 C) 97.8 F (36.6 C) 98.1 F (36.7 C)  TempSrc:   Oral   SpO2: 94%  92% 97%  Weight:      Height:        Intake/Output Summary (Last 24 hours) at 06/02/2021 1429 Last data filed at 06/02/2021 1100 Gross per 24 hour  Intake 240 ml  Output 200 ml  Net 40 ml   Filed Weights   05/23/21 1631 05/29/21 1758  Weight: 98.8 kg 84.3 kg     Examination:  General exam: Elderly woman , on RA eating breakfast by herself, not in distress.  Respiratory system: Clear to auscultation, no wheezing heard. On RA.  Cardiovascular system: RRR, no JVD, pedal edema present.  Gastrointestinal system: Abdomen is soft, non tender non distended bowel sounds wnl.  Central nervous system: Alert and oriented, non focal.  Extremities: mild edema, RLE knee immobilizer intact Skin: stage 1 pressure on coccyx.  Psychiatry: Mood is appropriate.     Data Reviewed: I have personally reviewed following labs and imaging studies  CBC: Recent Labs  Lab 05/27/21 0758 05/29/21 0431 05/31/21 0532 06/01/21 1206 06/02/21 0543  WBC 10.8* 8.3 6.0 11.2* 8.3  NEUTROABS 8.4*  --   --   --   --   HGB 10.1* 10.2* 12.3 10.5* 9.7*  HCT 30.9* 32.0* 39.3 33.2* 31.0*  MCV 83.7 84.0 83.8 85.6 84.9  PLT 135* 131* 145* 181 143*    Basic Metabolic Panel: Recent Labs  Lab 05/28/21 0933 05/29/21 0431 05/31/21 0532 06/01/21 1206 06/02/21 0543  NA 129* 131* 133* 130* 133*  K 4.0 3.8 3.6 4.0 3.6  CL 92* 97* 94* 93* 94*  CO2 _1 GLUCOSE 139* 163* 148* 205* 174*  BUN 52* 58* 55* 57* 59*  CREATININE 2.28* 2.23* 2.02* 1.92* 1.95*  CALCIUM 8.7* 8.4* 8.5* 8.8* 8.8*  MG  --   --  2.4  --  3.0*  PHOS  --   --   --   --  3.2    GFR: Estimated Creatinine Clearance: 27.4 mL/min (A) (by C-G formula based on SCr of 1.95 mg/dL (H)).  Liver Function Tests: Recent Labs  Lab 05/29/21 0431 05/31/21 0532  AST 25 33  ALT 9 13  ALKPHOS 131* 124  BILITOT 1.6* 1.4*  PROT 5.8* 6.0*  ALBUMIN 1.9* 1.9*    CBG: Recent Labs  Lab 06/01/21 1219 06/01/21 1752 06/01/21 2127 06/02/21 0809 06/02/21 1138  GLUCAP 171* 185* 157* 145* 215*     Recent Results (from the past 240 hour(s))  Resp Panel by RT-PCR (Flu A&B, Covid) Nasopharyngeal Swab     Status: None   Collection Time: 05/23/21 10:50 PM   Specimen: Nasopharyngeal Swab; Nasopharyngeal(NP)  swabs in  vial transport medium  Result Value Ref Range Status   SARS Coronavirus 2 by RT PCR NEGATIVE NEGATIVE Final    Comment: (NOTE) SARS-CoV-2 target nucleic acids are NOT DETECTED.  The SARS-CoV-2 RNA is generally detectable in upper respiratory specimens during the acute phase of infection. The lowest concentration of SARS-CoV-2 viral copies this assay can detect is 138 copies/mL. A negative result does not preclude SARS-Cov-2 infection and should not be used as the sole basis for treatment or other patient management decisions. A negative result may occur with  improper specimen collection/handling, submission of specimen other than nasopharyngeal swab, presence of viral mutation(s) within the areas targeted by this assay, and inadequate number of viral copies(<138 copies/mL). A negative result must be combined with clinical observations, patient history, and epidemiological information. The expected result is Negative.  Fact Sheet for Patients:  EntrepreneurPulse.com.au  Fact Sheet for Healthcare Providers:  IncredibleEmployment.be  This test is no t yet approved or cleared by the [place] FDA and  has been authorized for detection and/or diagnosis of SARS-CoV-2 by FDA under an Emergency Use Authorization (EUA). This EUA will remain  in effect (meaning this test can be used) for the duration of the COVID-19 declaration under Section 564(b)(1) of the Act, 21 U.S.C.section 360bbb-3(b)(1), unless the authorization is terminated  or revoked sooner.       Influenza A by PCR NEGATIVE NEGATIVE Final   Influenza B by PCR NEGATIVE NEGATIVE Final    Comment: (NOTE) The Xpert Xpress SARS-CoV-2/FLU/RSV plus assay is intended as an aid in the diagnosis of influenza from Nasopharyngeal swab specimens and should not be used as a sole basis for treatment. Nasal washings and aspirates are unacceptable for Xpert Xpress  SARS-CoV-2/FLU/RSV testing.  Fact Sheet for Patients: EntrepreneurPulse.com.au  Fact Sheet for Healthcare Providers: IncredibleEmployment.be  This test is not yet approved or cleared by the [place] FDA and has been authorized for detection and/or diagnosis of SARS-CoV-2 by FDA under an Emergency Use Authorization (EUA). This EUA will remain in effect (meaning this test can be used) for the duration of the COVID-19 declaration under Section 564(b)(1) of the Act, 21 U.S.C. section 360bbb-3(b)(1), unless the authorization is terminated or revoked.  Performed at [facility] [address] [place], [place] [address]   Culture, blood (Routine X 2) w Reflex to ID Panel     Status: None   Collection Time: 05/28/21  6:00 PM   Specimen: BLOOD  Result Value Ref Range Status   Specimen Description BLOOD BLOOD LEFT FOREARM  Final   Special Requests   Final    BOTTLES DRAWN AEROBIC AND ANAEROBIC Blood Culture results may not be optimal due to an inadequate volume of blood received in culture bottles   Culture   Final    NO GROWTH 5 DAYS Performed at [facility], [address] [place], [place] [address]    Report Status 06/02/2021 FINAL  Final  Culture, blood (Routine X 2) w Reflex to ID Panel     Status: None   Collection Time: 05/28/21  6:00 PM   Specimen: BLOOD  Result Value Ref Range Status   Specimen Description BLOOD BLOOD LEFT HAND  Final   Special Requests   Final    BOTTLES DRAWN AEROBIC AND ANAEROBIC Blood Culture results may not be optimal due to an inadequate volume of blood received in culture bottles   Culture   Final    NO GROWTH 5 DAYS Performed at [facility], [address] [place], [place] [address]    Report Status 06/02/2021 FINAL  Final  Urine Culture     Status: Abnormal   Collection Time: 05/28/21 10:10 PM   Specimen: Urine, Random  Result Value Ref Range Status   Specimen  Description   Final    URINE, RANDOM Performed at [facility], [address], [place], [place] [address]    Special Requests   Final    NONE Performed at [facility], [address] [place], [place] [address]    Culture (A)  Final    10,000 COLONIES/mL MULTIPLE SPECIES PRESENT, SUGGEST RECOLLECTION   Report Status 05/30/2021 FINAL  Final  MRSA Next Gen by PCR, Nasal     Status: None   Collection Time: 05/29/21  6:04 PM   Specimen: Nasal Mucosa; Nasal Swab  Result Value Ref Range Status   MRSA by PCR Next Gen NOT DETECTED NOT DETECTED Final    Comment: (NOTE) The GeneXpert MRSA Assay (FDA approved for NASAL specimens only), is one component of a comprehensive MRSA colonization surveillance program. It is not intended to diagnose MRSA infection nor to guide or monitor treatment for MRSA infections. Test performance is not FDA approved in patients less than 36 years old. Performed at [facility], [address], [place], [place] [address]           Radiology Studies: No results found.      Scheduled Meds:  (feeding supplement) PROSource Plus  30 mL Oral TID BM   Chlorhexidine Gluconate Cloth  6 each Topical Q0600   feeding supplement (GLUCERNA SHAKE)  237 mL Oral TID BM   heparin injection (subcutaneous)  5,000 Units Subcutaneous Q8H   heparin lock flush  500 Units Intracatheter Q30 days   insulin aspart  0-9 Units Subcutaneous TID WC   lactulose  30 g Oral TID   multivitamin with minerals  1 tablet Oral Daily   pantoprazole  40 mg Oral BID   rifaximin  550 mg Oral BID   spironolactone  100 mg Oral Daily   Continuous Infusions:  cefTRIAXone (ROCEPHIN)  IV       LOS: 10 days        Val Riles, MD [facility]   To contact the attending provider between 7A-7P or the covering provider during after hours 7P-7A, please log into the web site [email or web address] and access using universal [place] password for that web  site. If you do not have the password, please call the hospital operator.  06/02/2021, 2:29 PM

## 2021-06-02 NOTE — TOC Progression Note (Signed)
Transition of Care [facility]) - Progression Note    Patient Details  Name: Crystal [name] MRN: [id number] Date of Birth: [birth year]  Transition of Care [facility]) CM/SW [place], RN Phone Number: 06/02/2021, 10:11 AM  Clinical Narrative:    Received notification that the auth was approved and spoke with Jackelyn Poling at [place], she may be able to go over this afternoon, she will call me back to confirm    Expected Discharge Plan: [place] Barriers to Discharge: Continued Medical Work up  Expected Discharge Plan and Services Expected Discharge Plan: [place]   Discharge Planning Services: CM Consult                                           Social Determinants of Health (SDOH) Interventions    Readmission Risk Interventions Readmission Risk Prevention Plan 04/28/2021 04/07/2021  Transportation Screening Complete Complete  Medication Review (RN Care Manager) Complete Complete  PCP or Specialist appointment within 3-5 days of discharge - Complete  HRI or Home Care Consult Complete Complete  SW Recovery Care/Counseling Consult - Complete  Palliative Care Screening Not Applicable Not [place] Not Applicable Not Applicable  Some recent data might be hidden

## 2021-06-02 NOTE — Progress Notes (Addendum)
[facility]  ROUNDING NOTE   Subjective:   Crystal [name] is a 70 y.o. female with past medical conditions including anemia, CHF, cirrhosis, diabetes, GI bleed, and CKD4. Patient has been admitted to the hospital for Morbid obesity ([place]) [E66.01] Fracture of femur, distal, right, closed ([place]) [S72.401A] Cirrhosis of liver without ascites, unspecified hepatic cirrhosis type ([place]) [K74.60] Closed nondisplaced supracondylar fracture of distal end of right femur with intracondylar extension, initial encounter ([place]) [S72.464A] Congestive heart failure, unspecified HF chronicity, unspecified heart failure type ([place]) [I50.9]   Lab Results  Component Value Date   CREATININE 1.95 (H) 06/02/2021   CREATININE 1.92 (H) 06/01/2021   CREATININE 2.02 (H) 05/31/2021   07/20 0701 - 07/21 0700 In: -  Out: 600 [Urine:600]   Patient seen resting in bed eating breakfast Alert and oriented  States she feels great this morning Denies nausea and vomiting Patient more upset about visitation policy   Objective:  Vital signs in last 24 hours:  Temp:  [97.8 F (36.6 C)-98.2 F (36.8 C)] 97.8 F (36.6 C) (07/21 0806) Pulse Rate:  [73-98] 73 (07/21 0806) Resp:  [17-20] 17 (07/21 0806) BP: (111-122)/(62-67) 120/62 (07/21 0806) SpO2:  [92 %-94 %] 92 % (07/21 0806)  Weight change:  Filed Weights   05/23/21 1631 05/29/21 1758  Weight: 98.8 kg 84.3 kg    Intake/Output: I/O last 3 completed shifts: In: -  Out: 600 [Urine:600]   Intake/Output this shift:  Total I/O In: 240 [P.O.:240] Out: -   Physical Exam: General: NAD, resting in bed  Head: Normocephalic, atraumatic. Moist oral mucosal membranes  Eyes: Anicteric  Lungs:  Clear to auscultation, normal breathing  Heart: Regular rate and rhythm  Abdomen:  Soft, nontender  Extremities:  no peripheral edema.  Neurologic: Nonfocal, moving all four extremities  Skin: No lesions       Basic Metabolic Panel: Recent Labs  Lab  05/28/21 0933 05/29/21 0431 05/31/21 0532 06/01/21 1206 06/02/21 0543  NA 129* 131* 133* 130* 133*  K 4.0 3.8 3.6 4.0 3.6  CL 92* 97* 94* 93* 94*  CO2 _0 GLUCOSE 139* 163* 148* 205* 174*  BUN 52* 58* 55* 57* 59*  CREATININE 2.28* 2.23* 2.02* 1.92* 1.95*  CALCIUM 8.7* 8.4* 8.5* 8.8* 8.8*  MG  --   --  2.4  --  3.0*  PHOS  --   --   --   --  3.2     Liver Function Tests: Recent Labs  Lab 05/29/21 0431 05/31/21 0532  AST 25 33  ALT 9 13  ALKPHOS 131* 124  BILITOT 1.6* 1.4*  PROT 5.8* 6.0*  ALBUMIN 1.9* 1.9*    No results for input(s): LIPASE, AMYLASE in the last 168 hours. Recent Labs  Lab 05/31/21 0532 06/01/21 0636 06/02/21 0543  AMMONIA 18 11 <9*     CBC: Recent Labs  Lab 05/27/21 0758 05/29/21 0431 05/31/21 0532 06/01/21 1206 06/02/21 0543  WBC 10.8* 8.3 6.0 11.2* 8.3  NEUTROABS 8.4*  --   --   --   --   HGB 10.1* 10.2* 12.3 10.5* 9.7*  HCT 30.9* 32.0* 39.3 33.2* 31.0*  MCV 83.7 84.0 83.8 85.6 84.9  PLT 135* 131* 145* 181 143*     Cardiac Enzymes: No results for input(s): CKTOTAL, CKMB, CKMBINDEX, TROPONINI in the last 168 hours.  BNP: Invalid input(s): POCBNP  CBG: Recent Labs  Lab 06/01/21 8295 06/01/21 1219 06/01/21 1752 06/01/21 2127 06/02/21 0809  GLUCAP 230* [place]     Microbiology: Results for orders placed or performed during the hospital encounter of 05/23/21  Resp Panel by RT-PCR (Flu A&B, Covid) Nasopharyngeal Swab     Status: None   Collection Time: 05/23/21 10:50 PM   Specimen: Nasopharyngeal Swab; Nasopharyngeal(NP) swabs in vial transport medium  Result Value Ref Range Status   SARS Coronavirus 2 by RT PCR NEGATIVE NEGATIVE Final    Comment: (NOTE) SARS-CoV-2 target nucleic acids are NOT DETECTED.  The SARS-CoV-2 RNA is generally detectable in upper respiratory specimens during the acute phase of infection. The lowest concentration of SARS-CoV-2 viral copies this assay can detect is 138  copies/mL. A negative result does not preclude SARS-Cov-2 infection and should not be used as the sole basis for treatment or other patient management decisions. A negative result may occur with  improper specimen collection/handling, submission of specimen other than nasopharyngeal swab, presence of viral mutation(s) within the areas targeted by this assay, and inadequate number of viral copies(<138 copies/mL). A negative result must be combined with clinical observations, patient history, and epidemiological information. The expected result is Negative.  Fact Sheet for Patients:  EntrepreneurPulse.com.au  Fact Sheet for Healthcare Providers:  IncredibleEmployment.be  This test is no t yet approved or cleared by the [place] FDA and  has been authorized for detection and/or diagnosis of SARS-CoV-2 by FDA under an Emergency Use Authorization (EUA). This EUA will remain  in effect (meaning this test can be used) for the duration of the COVID-19 declaration under Section 564(b)(1) of the Act, 21 U.S.C.section 360bbb-3(b)(1), unless the authorization is terminated  or revoked sooner.       Influenza A by PCR NEGATIVE NEGATIVE Final   Influenza B by PCR NEGATIVE NEGATIVE Final    Comment: (NOTE) The Xpert Xpress SARS-CoV-2/FLU/RSV plus assay is intended as an aid in the diagnosis of influenza from Nasopharyngeal swab specimens and should not be used as a sole basis for treatment. Nasal washings and aspirates are unacceptable for Xpert Xpress SARS-CoV-2/FLU/RSV testing.  Fact Sheet for Patients: EntrepreneurPulse.com.au  Fact Sheet for Healthcare Providers: IncredibleEmployment.be  This test is not yet approved or cleared by the [place] FDA and has been authorized for detection and/or diagnosis of SARS-CoV-2 by FDA under an Emergency Use Authorization (EUA). This EUA will remain in effect (meaning  this test can be used) for the duration of the COVID-19 declaration under Section 564(b)(1) of the Act, 21 U.S.C. section 360bbb-3(b)(1), unless the authorization is terminated or revoked.  Performed at [facility], [address] [place], [place] [address]   Culture, blood (Routine X 2) w Reflex to ID Panel     Status: None   Collection Time: 05/28/21  6:00 PM   Specimen: BLOOD  Result Value Ref Range Status   Specimen Description BLOOD BLOOD LEFT FOREARM  Final   Special Requests   Final    BOTTLES DRAWN AEROBIC AND ANAEROBIC Blood Culture results may not be optimal due to an inadequate volume of blood received in culture bottles   Culture   Final    NO GROWTH 5 DAYS Performed at [facility], [address], [place], [place] [address]    Report Status 06/02/2021 FINAL  Final  Culture, blood (Routine X 2) w Reflex to ID Panel     Status: None   Collection Time: 05/28/21  6:00 PM   Specimen: BLOOD  Result Value Ref Range Status   Specimen Description BLOOD  BLOOD LEFT HAND  Final   Special Requests   Final    BOTTLES DRAWN AEROBIC AND ANAEROBIC Blood Culture results may not be optimal due to an inadequate volume of blood received in culture bottles   Culture   Final    NO GROWTH 5 DAYS Performed at [facility], [address], [place], [place] [address]    Report Status 06/02/2021 FINAL  Final  Urine Culture     Status: Abnormal   Collection Time: 05/28/21 10:10 PM   Specimen: Urine, Random  Result Value Ref Range Status   Specimen Description   Final    URINE, RANDOM Performed at [facility], [address], [place], [place] [address]    Special Requests   Final    NONE Performed at [facility], [address] [place], [place] [address]    Culture (A)  Final    10,000 COLONIES/mL MULTIPLE SPECIES PRESENT, SUGGEST RECOLLECTION   Report Status 05/30/2021 FINAL  Final  MRSA Next Gen by PCR, Nasal     Status: None    Collection Time: 05/29/21  6:04 PM   Specimen: Nasal Mucosa; Nasal Swab  Result Value Ref Range Status   MRSA by PCR Next Gen NOT DETECTED NOT DETECTED Final    Comment: (NOTE) The GeneXpert MRSA Assay (FDA approved for NASAL specimens only), is one component of a comprehensive MRSA colonization surveillance program. It is not intended to diagnose MRSA infection nor to guide or monitor treatment for MRSA infections. Test performance is not FDA approved in patients less than 54 years old. Performed at [facility], [address] [place],  [address]     Coagulation Studies: No results for input(s): LABPROT, INR in the last 72 hours.  Urinalysis: No results for input(s): COLORURINE, LABSPEC, PHURINE, GLUCOSEU, HGBUR, BILIRUBINUR, KETONESUR, PROTEINUR, UROBILINOGEN, NITRITE, LEUKOCYTESUR in the last 72 hours.  Invalid input(s): APPERANCEUR     Imaging: No results found.   Medications:    cefTRIAXone (ROCEPHIN)  IV      (feeding supplement) PROSource Plus  30 mL Oral TID BM   Chlorhexidine Gluconate Cloth  6 each Topical Q0600   feeding supplement (GLUCERNA SHAKE)  237 mL Oral TID BM   heparin injection (subcutaneous)  5,000 Units Subcutaneous Q8H   heparin lock flush  500 Units Intracatheter Q30 days   insulin aspart  0-9 Units Subcutaneous TID WC   lactulose  30 g Oral TID   multivitamin with minerals  1 tablet Oral Daily   pantoprazole  40 mg Oral BID   rifaximin  550 mg Oral BID   spironolactone  100 mg Oral Daily   acetaminophen, albuterol, benzonatate, guaiFENesin-dextromethorphan, heparin lock flush **AND** heparin lock flush, lactulose, morphine injection, ondansetron **OR** ondansetron (ZOFRAN) IV, oxyCODONE, polyvinyl alcohol  Assessment/ Plan:  Ms. Elida Harbin Stonerock is a 70 y.o.  female with past medical conditions including anemia, CHF, non alcoholic cirrhosis, h/o TIPS, diabetes, h/o GI bleed, and CKD4. Patient has been admitted to the hospital  for Morbid obesity ([place]) [E66.01] Fracture of femur, distal, right, closed ([place]) [S72.401A] Cirrhosis of liver without ascites, unspecified hepatic cirrhosis type ([place]) [K74.60] Closed nondisplaced supracondylar fracture of distal end of right femur with intracondylar extension, initial encounter ([place]) [S72.464A] Congestive heart failure, unspecified HF chronicity, unspecified heart failure type ([place]) [I50.9]   Acute Kidney Injury on chronic kidney disease stage 4 with baseline creatinine 1.85 and GFR of 29 on 05/25/2021.  Renal ultrasound neg for  hydronephrosis (05/27/21) IV contrast exposure on 05/20/21 during cardiac cath Creatinine close to baseline At discharge, will return to office for outpatient followup  Lab Results  Component Value Date   CREATININE 1.95 (H) 06/02/2021   CREATININE 1.92 (H) 06/01/2021   CREATININE 2.02 (H) 05/31/2021    Intake/Output Summary (Last 24 hours) at 06/02/2021 1124 Last data filed at 06/02/2021 1100 Gross per 24 hour  Intake 240 ml  Output 600 ml  Net -360 ml     2. Anemia of chronic kidney disease  Lab Results  Component Value Date   HGB 9.7 (L) 06/02/2021   Hgb at goal   3. Chronic hyponatremia Likely due to underlying cirrhosis Na level 133 (ranges 128-139 for her) Continue to encourage oral intake Counselled patient to follow sensible fluid restriction    4. Admitted post mechanical fall resulting in closed non displaced right femur fracture with supra patellar hemarthrosis    LOS: 10 Shantelle Breeze 7/21/202211:24 AM

## 2021-06-02 NOTE — Progress Notes (Signed)
PT Cancellation Note  Patient Details Name: Crystal [name] MRN: [id number] DOB: 09-Apr-1951   Cancelled Treatment:     PT attempt. Pt is alert and oriented x 3. Agrees to session however upon arriving pt has large watery stool. Pt requested female assist with clean up/hygiene care. Will return at later time/date when pt is more appropriate to participate.    Willette Pa 06/02/2021, 2:50 PM

## 2021-06-02 NOTE — Discharge Summary (Addendum)
[facility] Discharge Summary   Patient: Crystal [name] AYT:[id number]  PCP: Ileene Hutchinson, PA  Date of admission: 05/23/2021   Date of discharge:  06/03/2021     Discharge Diagnoses:  Principal Problem:   Fracture of femur, distal, right, closed ([place]) Active Problems:   S/P TIPS (transjugular intrahepatic portosystemic shunt)   Coronary artery disease involving native coronary artery of native heart without angina pectoris   Liver cirrhosis secondary to NASH (HCC)   Chronic diastolic CHF (congestive heart failure) ([place])   Diabetes mellitus, type 2 ([place])   Chronic kidney disease, stage IV (severe) (HCC)   Closed fracture of distal end of right femur ([place])   Accidental fall   Pressure injury of skin   Admitted From: Home Disposition:  SNF   Recommendations for Outpatient Follow-up:  PCP: in 1 wk Orthopedics in 1 week GI/hepatologist for liver cirrhosis in 1 to 2 weeks Follow up LABS/TEST:  CBC And BMP in 1 wk   Contact information for after-discharge care     [facility] .   Service: Skilled Nursing Contact information: [address] [place] [phone]                    Diet recommendation: Cardiac diet  Activity: The patient is advised to gradually reintroduce usual activities, as tolerated  Discharge Condition: stable  Code Status: Full code   History of present illness: As per the H and P dictated on admission Crystal [name] is a 70 y.o. 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 Patient usually ambulates with a walker, but was using her cane and tripped over her shoe.  No loss of consciousness, no preceding chest pain, palpitations, dizziness.   In the ER, CT showed nondisplaced femur fracture, distal right femur with suprapatellar hemarthrosis.  Orthopedics were consulted and the hospitalist  service were asked to evaluate. Pt seen and examined at bedside, she is alert and oriented, eating breakfast by herself. Denies any new complaints  Hospital Course:  Mechanical fall leading to distal femur fracture on the right -Orthopedics consulted,Rec that patient's fracture is nondisplaced and she has significant perioperative risks, so we will plan for nonoperative management.  Recommend nonweightbearing on the right lower extremity.  After 1 week, okay to transition to a hinged knee brace for gentle knee range of motion. Therapy evaluations recommending SNF. TOC on board, palliative care consulted for goals of care, after discussing with family they wanted full scope of treatment.  Continue oxycodone and Tylenol as needed for pain control   Urinary tract infection; s/p IV ceftriaxone, we will transition to oral Augmentin for 5 additional days to complete course for 7 days.  Urine culture grew 10K colonies, no further work-up.  Patient is clinically improving.  Acute hepatic encephalopathy and Acute metabolic encephalopathy: Initially Improved with lactulose enemas. Ammonia levels normalized. MRI brain without contrast is negative for acute stroke. Avoid narcotic pain medications as she remained lethargic for atleast 24 hours from one dose of oxycodone. She was also found to have an UTI , blood cultures are negative, urine cultures show  10,000 multiple species present. She was empirically started on IV antibiotics, with improvement in the mental status, recommend to complete the course of antibiotics. Therapy evaluations recommending SNF  NASH cirrhosis complicated by portal hypertension , s/p TIPS, in the  setting of hepatic encephalopathy. US abdomen is negative for ascites. S/p Rifaximin, held the lasix as pt has minimal oral intake. Continued with spironolactone.  On discharge decreased the dose of Lasix 40 mg daily, continued metolazone and decrease to spironolactone from 150 mg daily.   Recommend to follow-up with GI/hepatologist as an outpatient.  Restart rifaximin if any suspicion of SBP. Acute on Stage IV CKD, Slight worsening of creatinine from 1.8 to 2.2 and plateaued at 2 . IV contrast exposure earlier this month on 05/20/21 for cardiac cath.  Nephrology was consulted, creatinine remained stable slightly improved.  Decreased dose of Lasix 40 mg daily, continue metolazone and decrease his spironolactone.  Follow with nephrology as an outpatient.  Repeat BMP in 1 week. Chronic diastolic heart failure, Appears compensated at this time Patient currently denies any shortness of breath or chest pain but lower extremity edema present. Hypokalemia, K replaced and resolved   Chronic hyponatremia. Suspect a combination of cirrhosis and diuretic use. serum osmolarity is 277, Na 133 today, Continue to monitor. Anemia of chronic disease, No bleeding evident. Hemoglobin around 10.  Thrombocytopenia, resolved plt count  181k, Probably secondary to cirrhosis, no bleeding .  Diabetes mellitus, non insulin dependent. Hemoglobin A1c is 5.6. Bilateral lower extremity edema; Venous duplex of the lower extremity show non occlusive DVT in the popliteal vein, unable to anti coagulate fully due to h/o of GI bleed requiring hospitalization and 3 units of prbc transfusion earlier this year. She has an IVC Filter in place. Dicussed the findings with the patient and daughter  and in agreement with the plan. Sub conjunctival hemorrhage: Supportive management, she denies any pain, or blurry vision, she reports feeling rubbery in the right eye.    Body mass index is 33.99 kg/m.  Nutrition Problem: Increased nutrient needs Etiology: post-op healing Nutrition Interventions: Interventions: MVI, Prostat  Pressure Injury 05/29/21 Thigh Posterior;Proximal;Right Stage 2 -  Partial thickness loss of dermis presenting as a shallow open injury with a red, pink wound bed without slough. (Active)  05/29/21 1837   Location: Thigh  Location Orientation: Posterior;Proximal;Right  Staging: Stage 2 -  Partial thickness loss of dermis presenting as a shallow open injury with a red, pink wound bed without slough.  Wound Description (Comments):   Present on Admission: Yes     Pressure Injury 05/29/21 Coccyx Medial Stage 1 -  Intact skin with non-blanchable redness of a localized area usually over a bony prominence. (Active)  05/29/21 1837  Location: Coccyx  Location Orientation: Medial  Staging: Stage 1 -  Intact skin with non-blanchable redness of a localized area usually over a bony prominence.  Wound Description (Comments):   Present on Admission: Yes     Pain control  - [place] Controlled Substance Reporting System database was reviewed. - 7 day supply was provided. - Patient was instructed, not to drive, operate heavy machinery, perform activities at heights, swimming or participation in water activities or provide baby sitting services while on Pain, Sleep and Anxiety Medications; until her outpatient Physician has advised to do so again.  - Also recommended to not to take more than prescribed Pain, Sleep and Anxiety Medications.  Patient was seen by physical therapy, who recommended SNF, which was arranged. On the day of the discharge the patient's vitals were stable, and no other acute medical condition were reported by patient. the patient was felt safe to be discharge at [facility] .  7/22 There was no bed available yesterday so pt is being  discharged today. RLE pain is 10/10 but improves after pain meds down to 5/10, no any other complaints. Stable to d/c today  Consultants: Orthopedics and nephrologist Procedures: None  Discharge Exam: General: Appear in mild distress, no   Rash; Oral Mucosa Clear, moist. Cardiovascular: S1 and S2 Present, no Murmur, Respiratory: normal respiratory effort, Bilateral Air entry present and no Crackles, no wheezes Abdomen: Bowel Sound present, Soft and no  tenderness, no hernia Extremities: no Pedal edema, no calf tenderness Neurology: alert and oriented to time, place, and person affect appropriate.  Filed Weights   05/23/21 1631 05/29/21 1758  Weight: 98.8 kg 84.3 kg   Vitals:   06/03/21 0444 06/03/21 0754  BP: (!) 124/58 (!) 120/59  Pulse: 79 80  Resp: 20 17  Temp: 99.2 F (37.3 C) 98.4 F (36.9 C)  SpO2: 90% 94%    DISCHARGE MEDICATION: Allergies as of 06/03/2021   No Known Allergies      Medication List     STOP taking these medications    aspirin-sod bicarb-citric acid 325 MG Tbef tablet Commonly known as: ALKA-SELTZER   cyanocobalamin 1000 MCG/ML injection Commonly known as: (VITAMIN B-12)   gabapentin 100 MG capsule Commonly known as: NEURONTIN   sodium bicarbonate 650 MG tablet   Xifaxan 550 MG Tabs tablet Generic drug: rifaximin       TAKE these medications    acetaminophen 500 MG tablet Commonly known as: TYLENOL Take 1 tablet (500 mg total) by mouth every 6 (six) hours as needed for mild pain, moderate pain, fever or headache.   amoxicillin-clavulanate 875-125 MG tablet Commonly known as: Augmentin Take 1 tablet by mouth 2 (two) times daily for 5 days.   benzonatate 100 MG capsule Commonly known as: TESSALON Take 1 capsule (100 mg total) by mouth 4 (four) times daily as needed.   furosemide 80 MG tablet Commonly known as: LASIX Take 0.5 tablets (40 mg total) by mouth daily. What changed:  how much to take when to take this   lactulose 10 GM/15ML solution Commonly known as: CHRONULAC Take 30 mLs (20 g total) by mouth 3 (three) times daily. What changed: when to take this   metolazone 5 MG tablet Commonly known as: ZAROXOLYN Take 1 tablet (5 mg total) by mouth every other day.   Misc. Devices Kit Moderate compression hose 20-30 MM HG   ondansetron 4 MG tablet Commonly known as: ZOFRAN Take 4 mg by mouth every 8 (eight) hours as needed for nausea or vomiting.   OneTouch Delica  Lancets 33H Misc 2 (two) times daily.   OneTouch Ultra test strip Generic drug: glucose blood 2 (two) times daily.   oxyCODONE 5 MG immediate release tablet Commonly known as: Oxy IR/ROXICODONE Take 1 tablet (5 mg total) by mouth every 6 (six) hours as needed for up to 7 days for moderate pain or severe pain.   pantoprazole 40 MG tablet Commonly known as: PROTONIX Take 1 tablet (40 mg total) by mouth 2 (two) times daily.   potassium chloride SA 20 MEQ tablet Commonly known as: KLOR-CON Take 1 tablet (20 mEq total) by mouth daily. What changed: when to take this   spironolactone 50 MG tablet Commonly known as: ALDACTONE Take 1 tablet (50 mg total) by mouth daily. What changed:  medication strength how much to take   VISINE OP Place 1 drop into both eyes daily as needed (irritaiton).   Vitamin D (Ergocalciferol) 1.25 MG (50000 UNIT) Caps capsule Commonly known as: DRISDOL  Take 50,000 Units by mouth every Sunday.       ASK your doctor about these medications    hydrOXYzine 25 MG tablet Commonly known as: ATARAX/VISTARIL Take 1 tablet (25 mg total) by mouth 2 (two) times daily as needed.       No Known Allergies Discharge Instructions     Call MD for:  difficulty breathing, headache or visual disturbances   Complete by: As directed    Call MD for:  extreme fatigue   Complete by: As directed    Call MD for:  severe uncontrolled pain   Complete by: As directed    Call MD for:  temperature >100.4   Complete by: As directed    Diet - low sodium heart healthy   Complete by: As directed    Discharge instructions   Complete by: As directed    Follow with PCP, patient to be seen by an MD in 1 to 2 days at the facility. Follow with orthopedics in 1 to 2 weeks.   Increase activity slowly   Complete by: As directed    No wound care   Complete by: As directed    As per wound care RN       The results of significant diagnostics from this hospitalization  (including imaging, microbiology, ancillary and laboratory) are listed below for reference.    Significant Diagnostic Studies: CT Head Wo Contrast  Result Date: 05/23/2021 CLINICAL DATA:  Pain after fall EXAM: CT HEAD WITHOUT CONTRAST CT CERVICAL SPINE WITHOUT CONTRAST TECHNIQUE: Multidetector CT imaging of the head and cervical spine was performed following the standard protocol without intravenous contrast. Multiplanar CT image reconstructions of the cervical spine were also generated. COMPARISON:  Apr 04, 2021 FINDINGS: CT HEAD FINDINGS Brain: Similar mild age related global parenchymal volume loss. Stable mild burden of chronic ischemic white matter microvascular disease. No evidence of acute large vascular territory infarction, hemorrhage, hydrocephalus, extra-axial collection or mass lesion/mass effect. Vascular: No hyperdense vessel. Atherosclerotic calcifications of the internal carotid and vertebral arteries at the skull base. Skull: Normal. Negative for fracture or focal lesion. Sinuses/Orbits: Visualized portions of the paranasal sinuses and mastoid air cells are predominantly clear. Orbits are grossly unremarkable. Other: None CT CERVICAL SPINE FINDINGS Alignment: Mild loss of the normal cervical lordosis. No evidence of traumatic listhesis. 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 No acute fracture. Soft tissues and spinal canal: No prevertebral fluid or swelling. No visible canal hematoma. Disc levels: Moderate multilevel discogenic disease most notable in the lower cervical spine. Upper chest: No acute abnormality. Other: Calcifications in the bilateral parotid glands. Carotid artery calcifications. Partially visualized right chest wall Port-A-Cath. IMPRESSION: 1. No evidence of acute intracranial process. 2. No evidence of acute fracture or traumatic listhesis of the cervical spine. 3.  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 4. 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 Electronically Signed   By: Dahlia Bailiff MD   On: 05/23/2021 17:53   CT Cervical Spine Wo Contrast  Result Date: 05/23/2021 CLINICAL DATA:  Pain after fall EXAM: CT HEAD WITHOUT CONTRAST CT CERVICAL SPINE WITHOUT CONTRAST TECHNIQUE: Multidetector CT imaging of the head and cervical spine was performed following the standard protocol without intravenous contrast. Multiplanar CT image reconstructions of the cervical spine were also generated. COMPARISON:  Apr 04, 2021 FINDINGS:  CT HEAD FINDINGS Brain: Similar mild age related global parenchymal volume loss. Stable mild burden of chronic ischemic white matter microvascular disease. No evidence of acute large vascular territory infarction, hemorrhage, hydrocephalus, extra-axial collection or mass lesion/mass effect. Vascular: No hyperdense vessel. Atherosclerotic calcifications of the internal carotid and vertebral arteries at the skull base. Skull: Normal. Negative for fracture or focal lesion. Sinuses/Orbits: Visualized portions of the paranasal sinuses and mastoid air cells are predominantly clear. Orbits are grossly unremarkable. Other: None CT CERVICAL SPINE FINDINGS Alignment: Mild loss of the normal cervical lordosis. No evidence of traumatic listhesis. 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 No acute fracture. Soft tissues and spinal canal: No prevertebral fluid or swelling. No visible canal hematoma. Disc levels: Moderate multilevel discogenic disease most notable in the lower cervical spine. Upper chest: No acute abnormality. Other: Calcifications in the bilateral parotid glands. Carotid artery calcifications. Partially visualized right chest wall Port-A-Cath.  IMPRESSION: 1. No evidence of acute intracranial process. 2. No evidence of acute fracture or traumatic listhesis of the cervical spine. 3. 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 4. 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 Electronically Signed   By: Dahlia Bailiff MD   On: 05/23/2021 17:53   CT Knee Right Wo Contrast  Result Date: 05/23/2021 CLINICAL DATA:  Fall EXAM: CT OF THE RIGHT KNEE WITHOUT CONTRAST TECHNIQUE: Multidetector CT imaging of the right knee was performed according to the standard protocol. Multiplanar CT image reconstructions were also generated. COMPARISON:  None. FINDINGS: There is a nondisplaced fracture of the distal right femur with fracture lines extending to the supracondylar lateral femoral surface. Fracture line of the middle portion of the distal femoral shaft extends to intercondylar surface. Fracture lines best delineated on series 4 and series 101. Right suprapatellar hemarthrosis. IMPRESSION: 1. Nondisplaced fracture of the distal right femur with fracture lines extending to the supracondylar lateral femoral cortex and intercondylar surface. 2. Right suprapatellar hemarthrosis. Electronically Signed   By: Ulyses Jarred M.D.   On: 05/23/2021 21:23   MR BRAIN WO CONTRAST  Result Date: 05/27/2021 CLINICAL DATA:  Mental status change, unknown cause. EXAM: MRI HEAD WITHOUT CONTRAST TECHNIQUE: Multiplanar, multiecho pulse sequences of the brain and surrounding structures were obtained without intravenous contrast. COMPARISON:  Head CT May 23, 2021. FINDINGS: Brain: No acute infarction, hemorrhage, hydrocephalus, extra-axial collection or mass lesion. A few foci of T2 hyperintensity are seen within the white matter of the cerebral hemispheres, nonspecific. Vascular: Normal flow voids. Skull and upper cervical spine: Normal marrow signal. Sinuses/Orbits: Negative. Other: None.  IMPRESSION: 1. No acute intracranial abnormality. 2. Small amount of nonspecific T2 hyperintense lesions of the white matter, may represent early chronic microangiopathy. Electronically Signed   By: Pedro Earls M.D.   On: 05/27/2021 13:10   CARDIAC CATHETERIZATION  Result Date: 05/20/2021 Conclusions: 1. Normal left heart and pulmonary artery pressures. 2. Mildly elevated right heart filling pressures. 3. Normal cardiac output/index. Recommendations: 1. Chronic lower extremity edema does not appear to be driven primarily by heart failure. 2. Continue current doses of furosemide and spironolactone.  Decrease metolazone to 5 mg every other day to lessen risk for worsening renal insufficiency. 3. Further management per hepatology and nephrology, as chronic edema favored to be due to chronic liver and kidney disease. Nelva Bush, MD [facility]  US RENAL  Result Date: 05/27/2021 CLINICAL DATA:  Acute kidney injury EXAM: RENAL / URINARY TRACT ULTRASOUND COMPLETE COMPARISON:  None. FINDINGS: Right Kidney: Renal measurements: 10.5 x 4.4 x 3.7 cm = volume: 90.5 mL. Echogenicity within normal limits. No mass or hydronephrosis visualized. Left Kidney: Renal measurements: 10.3 x 4.0 x 4.7 cm = volume: 100 mL. Echogenicity within normal limits. No mass or hydronephrosis visualized. Bladder: Appears normal for degree of bladder distention. Other: None. IMPRESSION: Negative renal ultrasound.  No hydronephrosis. Electronically Signed   By: Julian Hy M.D.   On: 05/27/2021 19:13   US Abdomen Limited  Result Date: 05/28/2021 CLINICAL DATA:  Abdominal distension, possible ascites EXAM: LIMITED ABDOMEN ULTRASOUND FOR ASCITES TECHNIQUE: Limited ultrasound survey for ascites was performed in all four abdominal quadrants. COMPARISON:  None. FINDINGS: Ultrasound interrogation of the 4 quadrants of the abdomen demonstrates no evidence of ascites. IMPRESSION: Negative for ascites. Electronically  Signed   By: Jacqulynn Cadet M.D.   On: 05/28/2021 14:40   US Venous Img Lower Bilateral (DVT)  Result Date: 05/28/2021 CLINICAL DATA:  Bilateral lower extremity edema EXAM: BILATERAL LOWER EXTREMITY VENOUS DOPPLER ULTRASOUND TECHNIQUE: Gray-scale sonography with graded compression, as well as color Doppler and duplex ultrasound were performed to evaluate the lower extremity deep venous systems from the level of the common femoral vein and including the common femoral, femoral, profunda femoral, popliteal and calf veins including the posterior tibial, peroneal and gastrocnemius veins when visible. The superficial great saphenous vein was also interrogated. Spectral Doppler was utilized to evaluate flow at rest and with distal augmentation maneuvers in the common femoral, femoral and popliteal veins. COMPARISON:  None. FINDINGS: RIGHT LOWER EXTREMITY Common Femoral Vein: No evidence of thrombus. Normal compressibility, respiratory phasicity and response to augmentation. Saphenofemoral Junction: No evidence of thrombus. Normal compressibility and flow on color Doppler imaging. Profunda Femoral Vein: No evidence of thrombus. Normal compressibility and flow on color Doppler imaging. Femoral Vein: No evidence of thrombus. Normal compressibility, respiratory phasicity and response to augmentation. Popliteal Vein: Eccentric wall thickening in the right popliteal vein consistent with wall adherent thrombus. The vessel is partially compressible. There is evidence of color flow in the patent lumen on color Doppler imaging. Calf Veins: No evidence of thrombus. Normal compressibility and flow on color Doppler imaging. Superficial Great Saphenous Vein: No evidence of thrombus. Normal compressibility. Venous Reflux:  None. Other Findings: Occlusive thrombus extends into the right small saphenous vein. LEFT LOWER EXTREMITY Common Femoral Vein: No acute thrombus. The vessel is patent and compressible. There may be some trace  eccentric wall thickening at the saphenofemoral junction consistent with sequelae of remote and recanalized DVT. Saphenofemoral Junction: No evidence of thrombus. Normal compressibility and flow on color Doppler imaging. Profunda Femoral Vein: No evidence of thrombus. Normal compressibility and flow on color Doppler imaging. Femoral Vein: No evidence of thrombus. Normal compressibility, respiratory phasicity and response to augmentation. Popliteal Vein: No evidence of thrombus. Normal compressibility, respiratory phasicity and response to augmentation. Calf Veins: No evidence of thrombus. Normal compressibility and flow on color Doppler imaging. Superficial Great Saphenous Vein: No evidence of thrombus. Normal compressibility. Venous Reflux:  None. Other Findings:  None. IMPRESSION: 1. Subacute and partially recanalized nonocclusive DVT in the right popliteal vein. 2. Acute occlusive superficial thrombophlebitis involving the right small saphenous vein. 3. 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 Electronically Signed   By: Jacqulynn Cadet M.D.   On: 05/28/2021 14:39   DG  Chest Port 1 View  Result Date: 05/28/2021 CLINICAL DATA:  Possible sepsis47 y.o. 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 EXAM: PORTABLE CHEST 1 VIEW COMPARISON:  05/23/2021 and older exams. FINDINGS: Opacity noted at the lung bases, greater on the right, consistent with atelectasis, mildly increased from the previous study. Remainder of the lungs is clear. Cardiac silhouette normal in size. Normal mediastinal and hilar contours. No convincing pleural effusion and no pneumothorax. Stable right internal jugular Port-A-Cath. IMPRESSION: 1. Mild, right greater than left, basilar atelectasis, mildly increased from the prior study. 2. No convincing pneumonia. No evidence of pulmonary edema. No other change. Electronically  Signed   By: Lajean Manes M.D.   On: 05/28/2021 17:42   DG Chest Portable 1 View  Result Date: 05/23/2021 CLINICAL DATA:  Fall with blunt chest trauma EXAM: PORTABLE CHEST 1 VIEW COMPARISON:  Radiograph 04/29/2021, CT 02/01/2021 FINDINGS: Chronically coarsened interstitial and bronchitic features. More bandlike areas of opacity in the lung bases likely corresponding to areas of scarring in regions of as well as more nodular opacity in the right mid to lower lung no new consolidative process. No pneumothorax or effusion. No acute traumatic findings of the chest wall. Port-A-Cath projects over the right chest with tip in the mid SVC. Stable cardiomediastinal contours. IMPRESSION: No acute cardiopulmonary or traumatic findings. Ill-defined nodular opacities in the right lung base may reflect regions of masslike opacity seen on comparison CT and radiography. Difficult to fully assess interval change on portable radiographs. Electronically Signed   By: Lovena Le M.D.   On: 05/23/2021 19:16   DG Knee Complete 4 Views Right  Result Date: 05/23/2021 CLINICAL DATA:  Pain after fall EXAM: RIGHT KNEE - COMPLETE 4+ VIEW COMPARISON:  None. FINDINGS: No evidence of fracture or dislocation. Moderate suprapatellar joint effusion with overlying soft tissue swelling. Patellar enthesophyte. Mild tricompartment degenerative change. Vascular calcifications. IMPRESSION: 1. No acute osseous abnormality. 2. Moderate suprapatellar joint effusion with overlying soft tissue swelling 3. Mild tricompartment degenerative change. Electronically Signed   By: Dahlia Bailiff MD   On: 05/23/2021 17:56   EEG adult  Result Date: 05/30/2021 Greta Doom, MD     05/30/2021  3:00 PM History: 70 yo F with AMS Sedation: None Technique: This is a 21 channel routine scalp EEG performed at the bedside with bipolar and monopolar montages arranged in accordance to the international 10/20 system of electrode placement. One channel was  dedicated to EKG recording. Background: There is a well-formed posterior dominant rhythm of 8 Hz which is reactive to eye opening/closure.  In addition there is intrusion into the background of irregular low voltage delta and theta activity even during maximal wakefulness.  With drowsiness, there is prominent frontally predominant intermittent rhythmic delta activity(FIRDA). Photic stimulation: Physiologic driving is not performed EEG Abnormalities: 1) FIRDA 2) Generalized irregular slow activity. Clinical Interpretation: This EEG is consistent with a mild generalized nonspecific cerebral dysfunction (encephalopathy). There was no seizure or seizure predisposition recorded on this study. Please note that lack of epileptiform activity on EEG does not preclude the possibility of epilepsy. Roland Rack, MD [facility] [phone] If 7pm- 7am, please page neurology on call as listed in [place].    Microbiology: Recent Results (from the past 240 hour(s))  Culture, blood (Routine X 2) w Reflex to ID Panel     Status: None   Collection Time: 05/28/21  6:00  PM   Specimen: BLOOD  Result Value Ref Range Status   Specimen Description BLOOD BLOOD LEFT FOREARM  Final   Special Requests   Final    BOTTLES DRAWN AEROBIC AND ANAEROBIC Blood Culture results may not be optimal due to an inadequate volume of blood received in culture bottles   Culture   Final    NO GROWTH 5 DAYS Performed at [facility], [address] [place], [place] [address]    Report Status 06/02/2021 FINAL  Final  Culture, blood (Routine X 2) w Reflex to ID Panel     Status: None   Collection Time: 05/28/21  6:00 PM   Specimen: BLOOD  Result Value Ref Range Status   Specimen Description BLOOD BLOOD LEFT HAND  Final   Special Requests   Final    BOTTLES DRAWN AEROBIC AND ANAEROBIC Blood Culture results may not be optimal due to an inadequate volume of blood received in culture bottles   Culture   Final    NO  GROWTH 5 DAYS Performed at [facility], [address], [place], [place] [address]    Report Status 06/02/2021 FINAL  Final  Urine Culture     Status: Abnormal   Collection Time: 05/28/21 10:10 PM   Specimen: Urine, Random  Result Value Ref Range Status   Specimen Description   Final    URINE, RANDOM Performed at [facility], [address] [place], [place] [address]    Special Requests   Final    NONE Performed at [facility], [address] [place], [place] [address]    Culture (A)  Final    10,000 COLONIES/mL MULTIPLE SPECIES PRESENT, SUGGEST RECOLLECTION   Report Status 05/30/2021 FINAL  Final  MRSA Next Gen by PCR, Nasal     Status: None   Collection Time: 05/29/21  6:04 PM   Specimen: Nasal Mucosa; Nasal Swab  Result Value Ref Range Status   MRSA by PCR Next Gen NOT DETECTED NOT DETECTED Final    Comment: (NOTE) The GeneXpert MRSA Assay (FDA approved for NASAL specimens only), is one component of a comprehensive MRSA colonization surveillance program. It is not intended to diagnose MRSA infection nor to guide or monitor treatment for MRSA infections. Test performance is not FDA approved in patients less than 29 years old. Performed at [facility], [place], [place] [address]   SARS CORONAVIRUS 2 (TAT 6-24 HRS) Nasopharyngeal Nasopharyngeal Swab     Status: None   Collection Time: 06/02/21 11:10 AM   Specimen: Nasopharyngeal Swab  Result Value Ref Range Status   SARS Coronavirus 2 NEGATIVE NEGATIVE Final    Comment: (NOTE) SARS-CoV-2 target nucleic acids are NOT DETECTED.  The SARS-CoV-2 RNA is generally detectable in upper and lower respiratory specimens during the acute phase of infection. Negative results do not preclude SARS-CoV-2 infection, do not rule out co-infections with other pathogens, and should not be used as the sole basis for treatment or other patient management decisions. Negative results  must be combined with clinical observations, patient history, and epidemiological information. The expected result is Negative.  Fact Sheet for Patients: SugarRoll.be  Fact Sheet for Healthcare Providers: https://www.woods-mathews.com/  This test is not yet approved or cleared by the [place] FDA and  has been authorized for detection and/or diagnosis of SARS-CoV-2 by FDA under an Emergency Use Authorization (EUA). This EUA will remain  in effect (meaning this test can be used) for the duration  of the COVID-19 declaration under Se ction 564(b)(1) of the Act, 21 U.S.C. section 360bbb-3(b)(1), unless the authorization is terminated or revoked sooner.  Performed at [facility], [place] [address], [place], [place] [address]      Labs: CBC: Recent Labs  Lab 05/29/21 0431 05/31/21 0532 06/01/21 1206 06/02/21 0543 06/03/21 0545  WBC 8.3 6.0 11.2* 8.3 10.3  HGB 10.2* 12.3 10.5* 9.7* 9.4*  HCT 32.0* 39.3 33.2* 31.0* 29.1*  MCV 84.0 83.8 85.6 84.9 84.6  PLT 131* 145* 181 143* 540   Basic Metabolic Panel: Recent Labs  Lab 05/29/21 0431 05/31/21 0532 06/01/21 1206 06/02/21 0543 06/03/21 0545  NA 131* 133* 130* 133* 129*  K 3.8 3.6 4.0 3.6 3.9  CL 97* 94* 93* 94* 93*  CO2 _0 GLUCOSE 163* 148* 205* 174* 166*  BUN 58* 55* 57* 59* 59*  CREATININE 2.23* 2.02* 1.92* 1.95* 1.89*  CALCIUM 8.4* 8.5* 8.8* 8.8* 8.6*  MG  --  2.4  --  3.0* 2.8*  PHOS  --   --   --  3.2 3.3   Liver Function Tests: Recent Labs  Lab 05/29/21 0431 05/31/21 0532  AST 25 33  ALT 9 13  ALKPHOS 131* 124  BILITOT 1.6* 1.4*  PROT 5.8* 6.0*  ALBUMIN 1.9* 1.9*   No results for input(s): LIPASE, AMYLASE in the last 168 hours. Recent Labs  Lab 05/29/21 0431 05/31/21 0532 06/01/21 0636 06/02/21 0543 06/03/21 0545  AMMONIA _1 <9* <9*   Cardiac Enzymes: No results for input(s): CKTOTAL, CKMB, CKMBINDEX, TROPONINI in the last  168 hours. BNP (last 3 results) Recent Labs    12/02/20 1943 12/03/20 1630 04/27/21 0945  BNP 434.5* 514.6* 314.1*   CBG: Recent Labs  Lab 06/02/21 1138 06/02/21 1524 06/02/21 1708 06/02/21 2036 06/03/21 0755  GLUCAP 215* 185* 135* 166* 158*    Time spent: 35 minutes  Signed:  Val Riles  [facility]  06/03/2021 9:44 AM

## 2021-06-03 DIAGNOSIS — S72001A Fracture of unspecified part of neck of right femur, initial encounter for closed fracture: Secondary | ICD-10-CM | POA: Diagnosis not present

## 2021-06-03 DIAGNOSIS — K7290 Hepatic failure, unspecified without coma: Secondary | ICD-10-CM | POA: Diagnosis not present

## 2021-06-03 DIAGNOSIS — R5381 Other malaise: Secondary | ICD-10-CM | POA: Diagnosis not present

## 2021-06-03 DIAGNOSIS — D696 Thrombocytopenia, unspecified: Secondary | ICD-10-CM | POA: Diagnosis not present

## 2021-06-03 DIAGNOSIS — N184 Chronic kidney disease, stage 4 (severe): Secondary | ICD-10-CM | POA: Diagnosis not present

## 2021-06-03 DIAGNOSIS — R279 Unspecified lack of coordination: Secondary | ICD-10-CM | POA: Diagnosis not present

## 2021-06-03 DIAGNOSIS — M6281 Muscle weakness (generalized): Secondary | ICD-10-CM | POA: Diagnosis not present

## 2021-06-03 DIAGNOSIS — Y848 Other medical procedures as the cause of abnormal reaction of the patient, or of later complication, without mention of misadventure at the time of the procedure: Secondary | ICD-10-CM | POA: Diagnosis not present

## 2021-06-03 DIAGNOSIS — Z743 Need for continuous supervision: Secondary | ICD-10-CM | POA: Diagnosis not present

## 2021-06-03 DIAGNOSIS — N179 Acute kidney failure, unspecified: Secondary | ICD-10-CM | POA: Diagnosis not present

## 2021-06-03 DIAGNOSIS — D631 Anemia in chronic kidney disease: Secondary | ICD-10-CM | POA: Diagnosis not present

## 2021-06-03 DIAGNOSIS — R531 Weakness: Secondary | ICD-10-CM | POA: Diagnosis not present

## 2021-06-03 DIAGNOSIS — T8189XA Other complications of procedures, not elsewhere classified, initial encounter: Secondary | ICD-10-CM | POA: Diagnosis not present

## 2021-06-03 DIAGNOSIS — W19XXXA Unspecified fall, initial encounter: Secondary | ICD-10-CM | POA: Diagnosis not present

## 2021-06-03 DIAGNOSIS — I5033 Acute on chronic diastolic (congestive) heart failure: Secondary | ICD-10-CM | POA: Diagnosis not present

## 2021-06-03 DIAGNOSIS — K7581 Nonalcoholic steatohepatitis (NASH): Secondary | ICD-10-CM | POA: Diagnosis not present

## 2021-06-03 DIAGNOSIS — S72414D Nondisplaced unspecified condyle fracture of lower end of right femur, subsequent encounter for closed fracture with routine healing: Secondary | ICD-10-CM | POA: Diagnosis not present

## 2021-06-03 DIAGNOSIS — Z7401 Bed confinement status: Secondary | ICD-10-CM | POA: Diagnosis not present

## 2021-06-03 DIAGNOSIS — T82534A Leakage of infusion catheter, initial encounter: Secondary | ICD-10-CM | POA: Diagnosis present

## 2021-06-03 DIAGNOSIS — I2510 Atherosclerotic heart disease of native coronary artery without angina pectoris: Secondary | ICD-10-CM | POA: Diagnosis not present

## 2021-06-03 DIAGNOSIS — E871 Hypo-osmolality and hyponatremia: Secondary | ICD-10-CM | POA: Diagnosis not present

## 2021-06-03 DIAGNOSIS — K766 Portal hypertension: Secondary | ICD-10-CM | POA: Diagnosis not present

## 2021-06-03 DIAGNOSIS — I5032 Chronic diastolic (congestive) heart failure: Secondary | ICD-10-CM | POA: Diagnosis not present

## 2021-06-03 DIAGNOSIS — H1131 Conjunctival hemorrhage, right eye: Secondary | ICD-10-CM | POA: Diagnosis not present

## 2021-06-03 DIAGNOSIS — R52 Pain, unspecified: Secondary | ICD-10-CM | POA: Diagnosis not present

## 2021-06-03 DIAGNOSIS — E119 Type 2 diabetes mellitus without complications: Secondary | ICD-10-CM | POA: Diagnosis not present

## 2021-06-03 DIAGNOSIS — E1122 Type 2 diabetes mellitus with diabetic chronic kidney disease: Secondary | ICD-10-CM | POA: Diagnosis not present

## 2021-06-03 LAB — CBC
HCT: 29.1 % — ABNORMAL LOW (ref 36.0–46.0)
Hemoglobin: 9.4 g/dL — ABNORMAL LOW (ref 12.0–15.0)
MCH: 27.3 pg (ref 26.0–34.0)
MCHC: 32.3 g/dL (ref 30.0–36.0)
MCV: 84.6 fL (ref 80.0–100.0)
Platelets: 160 10*3/uL (ref 150–400)
RBC: 3.44 MIL/uL — ABNORMAL LOW (ref 3.87–5.11)
RDW: 18.8 % — ABNORMAL HIGH (ref 11.5–15.5)
WBC: 10.3 10*3/uL (ref 4.0–10.5)
nRBC: 0 % (ref 0.0–0.2)

## 2021-06-03 LAB — BASIC METABOLIC PANEL
Anion gap: 9 (ref 5–15)
BUN: 59 mg/dL — ABNORMAL HIGH (ref 8–23)
CO2: 27 mmol/L (ref 22–32)
Calcium: 8.6 mg/dL — ABNORMAL LOW (ref 8.9–10.3)
Chloride: 93 mmol/L — ABNORMAL LOW (ref 98–111)
Creatinine, Ser: 1.89 mg/dL — ABNORMAL HIGH (ref 0.44–1.00)
GFR, Estimated: 28 mL/min — ABNORMAL LOW (ref >60)
Glucose, Bld: 166 mg/dL — ABNORMAL HIGH (ref 70–99)
Potassium: 3.9 mmol/L (ref 3.5–5.1)
Sodium: 129 mmol/L — ABNORMAL LOW (ref 135–145)

## 2021-06-03 LAB — GLUCOSE, CAPILLARY
Glucose-Capillary: 158 mg/dL — ABNORMAL HIGH (ref 70–99)
Glucose-Capillary: 169 mg/dL — ABNORMAL HIGH (ref 70–99)

## 2021-06-03 LAB — PHOSPHORUS: Phosphorus: 3.3 mg/dL (ref 2.5–4.6)

## 2021-06-03 LAB — MAGNESIUM: Magnesium: 2.8 mg/dL — ABNORMAL HIGH (ref 1.7–2.4)

## 2021-06-03 LAB — AMMONIA: Ammonia: <9 umol/L — ABNORMAL LOW (ref 9–35)

## 2021-06-03 NOTE — Care Management Important Message (Signed)
Important Message  Patient Details  Name: MALOREE UPLINGER MRN: [id number] Date of Birth: 11/18/1950   Medicare Important Message Given:  Yes  Spoke with patient about IM appeal process, patient voices understanding and has no issues at this time, will take document to the room for details.   Kerin Salen, RN 06/03/2021, 10:05 AM

## 2021-06-03 NOTE — TOC Progression Note (Signed)
Transition of Care [facility]) - Progression Note    Patient Details  Name: Crystal [name] MRN: [id number] Date of Birth: 1951-02-19  Transition of Care [facility]) CM/SW [place], RN Phone Number: 06/03/2021, 9:49 AM  Clinical Narrative:    Called [facility] and they will be picking the patient up to take to [place] today at 2130, DC packet on the chart   Expected Discharge Plan: [place] Barriers to Discharge: Continued Medical Work up  Expected Discharge Plan and Services Expected Discharge Plan: [place]   Discharge Planning Services: CM Consult     Expected Discharge Date: 06/03/21                                     Social Determinants of Health (SDOH) Interventions    Readmission Risk Interventions Readmission Risk Prevention Plan 04/28/2021 04/07/2021  Transportation Screening Complete Complete  Medication Review (RN Care Manager) Complete Complete  PCP or Specialist appointment within 3-5 days of discharge - Complete  HRI or Home Care Consult Complete Complete  SW Recovery Care/Counseling Consult - Complete  Palliative Care Screening Not Applicable Not [place] Not Applicable Not Applicable  Some recent data might be hidden

## 2021-06-03 NOTE — Progress Notes (Signed)
Vss, pt had a rt sided chest port dressing was c/d/I, caps were changed. D/c packet and all other information given to EMS transport, [facility], [facility] room 21 bed 1. Called report successfully to nurse at facility.

## 2021-06-03 NOTE — Progress Notes (Signed)
[facility]  ROUNDING NOTE   Subjective:   Crystal [name] is a 70 y.o. female with past medical conditions including anemia, CHF, cirrhosis, diabetes, GI bleed, and CKD4. Patient has been admitted to the hospital for Morbid obesity ([place]) [E66.01] Fracture of femur, distal, right, closed ([place]) [S72.401A] Cirrhosis of liver without ascites, unspecified hepatic cirrhosis type ([place]) [K74.60] Closed nondisplaced supracondylar fracture of distal end of right femur with intracondylar extension, initial encounter ([place]) [S72.464A] Congestive heart failure, unspecified HF chronicity, unspecified heart failure type ([place]) [I50.9]   Lab Results  Component Value Date   CREATININE 1.89 (H) 06/03/2021   CREATININE 1.95 (H) 06/02/2021   CREATININE 1.92 (H) 06/01/2021   07/21 0701 - 07/22 0700 In: 570 [P.O.:480; IV Piggyback:90] Out: 100 [Urine:100]   Patient sitting up in bed, recently completed breakfast Alert, oriented, smiling on arrival No complaints at this time.  States she may discharge to rehab   Objective:  Vital signs in last 24 hours:  Temp:  [98 F (36.7 C)-99.2 F (37.3 C)] 98.7 F (37.1 C) (07/22 1123) Pulse Rate:  [74-86] 86 (07/22 1123) Resp:  [16-20] 17 (07/22 1123) BP: (118-131)/(55-64) 120/59 (07/22 1123) SpO2:  [90 %-97 %] 91 % (07/22 1123)  Weight change:  Filed Weights   05/23/21 1631 05/29/21 1758  Weight: 98.8 kg 84.3 kg    Intake/Output: I/O last 3 completed shifts: In: 570 [P.O.:480; IV Piggyback:90] Out: 300 [Urine:300]   Intake/Output this shift:  No intake/output data recorded.  Physical Exam: General: NAD, sitting up in bed  Head: Normocephalic, atraumatic. Moist oral mucosal membranes  Eyes: Anicteric  Lungs:  Clear to auscultation, normal breathing  Heart: Regular rate and rhythm  Abdomen:  Soft, nontender  Extremities:  no peripheral edema.  Neurologic: Nonfocal, moving all four extremities  Skin: No lesions       Basic  Metabolic Panel: Recent Labs  Lab 05/29/21 0431 05/31/21 0532 06/01/21 1206 06/02/21 0543 06/03/21 0545  NA 131* 133* 130* 133* 129*  K 3.8 3.6 4.0 3.6 3.9  CL 97* 94* 93* 94* 93*  CO2 _0 GLUCOSE 163* 148* 205* 174* 166*  BUN 58* 55* 57* 59* 59*  CREATININE 2.23* 2.02* 1.92* 1.95* 1.89*  CALCIUM 8.4* 8.5* 8.8* 8.8* 8.6*  MG  --  2.4  --  3.0* 2.8*  PHOS  --   --   --  3.2 3.3     Liver Function Tests: Recent Labs  Lab 05/29/21 0431 05/31/21 0532  AST 25 33  ALT 9 13  ALKPHOS 131* 124  BILITOT 1.6* 1.4*  PROT 5.8* 6.0*  ALBUMIN 1.9* 1.9*    No results for input(s): LIPASE, AMYLASE in the last 168 hours. Recent Labs  Lab 06/01/21 0636 06/02/21 0543 06/03/21 0545  AMMONIA 11 <9* <9*     CBC: Recent Labs  Lab 05/29/21 0431 05/31/21 0532 06/01/21 1206 06/02/21 0543 06/03/21 0545  WBC 8.3 6.0 11.2* 8.3 10.3  HGB 10.2* 12.3 10.5* 9.7* 9.4*  HCT 32.0* 39.3 33.2* 31.0* 29.1*  MCV 84.0 83.8 85.6 84.9 84.6  PLT 131* 145* 181 143* 160     Cardiac Enzymes: No results for input(s): CKTOTAL, CKMB, CKMBINDEX, TROPONINI in the last 168 hours.  BNP: Invalid input(s): POCBNP  CBG: Recent Labs  Lab 06/02/21 1524 06/02/21 1708 06/02/21 2036 06/03/21 0755 06/03/21 1125  GLUCAP 185* 135* 166* 158* 169*     Microbiology: Results for orders placed or performed during the  hospital encounter of 05/23/21  Resp Panel by RT-PCR (Flu A&B, Covid) Nasopharyngeal Swab     Status: None   Collection Time: 05/23/21 10:50 PM   Specimen: Nasopharyngeal Swab; Nasopharyngeal(NP) swabs in vial transport medium  Result Value Ref Range Status   SARS Coronavirus 2 by RT PCR NEGATIVE NEGATIVE Final    Comment: (NOTE) SARS-CoV-2 target nucleic acids are NOT DETECTED.  The SARS-CoV-2 RNA is generally detectable in upper respiratory specimens during the acute phase of infection. The lowest concentration of SARS-CoV-2 viral copies this assay can detect is 138  copies/mL. A negative result does not preclude SARS-Cov-2 infection and should not be used as the sole basis for treatment or other patient management decisions. A negative result may occur with  improper specimen collection/handling, submission of specimen other than nasopharyngeal swab, presence of viral mutation(s) within the areas targeted by this assay, and inadequate number of viral copies(<138 copies/mL). A negative result must be combined with clinical observations, patient history, and epidemiological information. The expected result is Negative.  Fact Sheet for Patients:  EntrepreneurPulse.com.au  Fact Sheet for Healthcare Providers:  IncredibleEmployment.be  This test is no t yet approved or cleared by the [place] FDA and  has been authorized for detection and/or diagnosis of SARS-CoV-2 by FDA under an Emergency Use Authorization (EUA). This EUA will remain  in effect (meaning this test can be used) for the duration of the COVID-19 declaration under Section 564(b)(1) of the Act, 21 U.S.C.section 360bbb-3(b)(1), unless the authorization is terminated  or revoked sooner.       Influenza A by PCR NEGATIVE NEGATIVE Final   Influenza B by PCR NEGATIVE NEGATIVE Final    Comment: (NOTE) The Xpert Xpress SARS-CoV-2/FLU/RSV plus assay is intended as an aid in the diagnosis of influenza from Nasopharyngeal swab specimens and should not be used as a sole basis for treatment. Nasal washings and aspirates are unacceptable for Xpert Xpress SARS-CoV-2/FLU/RSV testing.  Fact Sheet for Patients: EntrepreneurPulse.com.au  Fact Sheet for Healthcare Providers: IncredibleEmployment.be  This test is not yet approved or cleared by the [place] FDA and has been authorized for detection and/or diagnosis of SARS-CoV-2 by FDA under an Emergency Use Authorization (EUA). This EUA will remain in effect (meaning  this test can be used) for the duration of the COVID-19 declaration under Section 564(b)(1) of the Act, 21 U.S.C. section 360bbb-3(b)(1), unless the authorization is terminated or revoked.  Performed at [facility], [address] [place], [place] [address]   Culture, blood (Routine X 2) w Reflex to ID Panel     Status: None   Collection Time: 05/28/21  6:00 PM   Specimen: BLOOD  Result Value Ref Range Status   Specimen Description BLOOD BLOOD LEFT FOREARM  Final   Special Requests   Final    BOTTLES DRAWN AEROBIC AND ANAEROBIC Blood Culture results may not be optimal due to an inadequate volume of blood received in culture bottles   Culture   Final    NO GROWTH 5 DAYS Performed at [facility], [address], [place], [place] [address]    Report Status 06/02/2021 FINAL  Final  Culture, blood (Routine X 2) w Reflex to ID Panel     Status: None   Collection Time: 05/28/21  6:00 PM   Specimen: BLOOD  Result Value Ref Range Status   Specimen Description BLOOD BLOOD LEFT HAND  Final   Special Requests   Final    BOTTLES DRAWN AEROBIC AND  ANAEROBIC Blood Culture results may not be optimal due to an inadequate volume of blood received in culture bottles   Culture   Final    NO GROWTH 5 DAYS Performed at [facility], [address] [place], [place] [address]    Report Status 06/02/2021 FINAL  Final  Urine Culture     Status: Abnormal   Collection Time: 05/28/21 10:10 PM   Specimen: Urine, Random  Result Value Ref Range Status   Specimen Description   Final    URINE, RANDOM Performed at [facility], [address], [place], [place] [address]    Special Requests   Final    NONE Performed at [facility], [address] [place], [place] [address]    Culture (A)  Final    10,000 COLONIES/mL MULTIPLE SPECIES PRESENT, SUGGEST RECOLLECTION   Report Status 05/30/2021 FINAL  Final  MRSA Next Gen by PCR, Nasal     Status: None    Collection Time: 05/29/21  6:04 PM   Specimen: Nasal Mucosa; Nasal Swab  Result Value Ref Range Status   MRSA by PCR Next Gen NOT DETECTED NOT DETECTED Final    Comment: (NOTE) The GeneXpert MRSA Assay (FDA approved for NASAL specimens only), is one component of a comprehensive MRSA colonization surveillance program. It is not intended to diagnose MRSA infection nor to guide or monitor treatment for MRSA infections. Test performance is not FDA approved in patients less than 51 years old. Performed at [facility], [place], [place] [address]   SARS CORONAVIRUS 2 (TAT 6-24 HRS) Nasopharyngeal Nasopharyngeal Swab     Status: None   Collection Time: 06/02/21 11:10 AM   Specimen: Nasopharyngeal Swab  Result Value Ref Range Status   SARS Coronavirus 2 NEGATIVE NEGATIVE Final    Comment: (NOTE) SARS-CoV-2 target nucleic acids are NOT DETECTED.  The SARS-CoV-2 RNA is generally detectable in upper and lower respiratory specimens during the acute phase of infection. Negative results do not preclude SARS-CoV-2 infection, do not rule out co-infections with other pathogens, and should not be used as the sole basis for treatment or other patient management decisions. Negative results must be combined with clinical observations, patient history, and epidemiological information. The expected result is Negative.  Fact Sheet for Patients: SugarRoll.be  Fact Sheet for Healthcare Providers: https://www.woods-mathews.com/  This test is not yet approved or cleared by the [place] FDA and  has been authorized for detection and/or diagnosis of SARS-CoV-2 by FDA under an Emergency Use Authorization (EUA). This EUA will remain  in effect (meaning this test can be used) for the duration of the COVID-19 declaration under Se ction 564(b)(1) of the Act, 21 U.S.C. section 360bbb-3(b)(1), unless the authorization is terminated  or revoked sooner.  Performed at [facility], [place] [address] [place], [place] [address]     Coagulation Studies: No results for input(s): LABPROT, INR in the last 72 hours.  Urinalysis: No results for input(s): COLORURINE, LABSPEC, PHURINE, GLUCOSEU, HGBUR, BILIRUBINUR, KETONESUR, PROTEINUR, UROBILINOGEN, NITRITE, LEUKOCYTESUR in the last 72 hours.  Invalid input(s): APPERANCEUR     Imaging: No results found.   Medications:    cefTRIAXone (ROCEPHIN)  IV 1 g (06/02/21 1625)    (feeding supplement) PROSource Plus  30 mL Oral TID BM   Chlorhexidine Gluconate Cloth  6 each Topical Q0600   feeding supplement (GLUCERNA SHAKE)  237 mL Oral TID BM   heparin injection (subcutaneous)  5,000 Units Subcutaneous Q8H  heparin lock flush  500 Units Intracatheter Q30 days   insulin aspart  0-9 Units Subcutaneous TID WC   lactulose  30 g Oral TID   multivitamin with minerals  1 tablet Oral Daily   pantoprazole  40 mg Oral BID   rifaximin  550 mg Oral BID   spironolactone  100 mg Oral Daily   acetaminophen, albuterol, benzonatate, guaiFENesin-dextromethorphan, heparin lock flush **AND** heparin lock flush, lactulose, morphine injection, ondansetron **OR** ondansetron (ZOFRAN) IV, oxyCODONE, polyvinyl alcohol  Assessment/ Plan:  Ms. Crystal [name] is a 70 y.o.  female with past medical conditions including anemia, CHF, non alcoholic cirrhosis, h/o TIPS, diabetes, h/o GI bleed, and CKD4. Patient has been admitted to the hospital for Morbid obesity ([place]) [E66.01] Fracture of femur, distal, right, closed ([place]) [S72.401A] Cirrhosis of liver without ascites, unspecified hepatic cirrhosis type ([place]) [K74.60] Closed nondisplaced supracondylar fracture of distal end of right femur with intracondylar extension, initial encounter (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  Renal ultrasound neg for hydronephrosis (05/27/21) IV contrast exposure on 05/20/21 during cardiac cath Creatinine at baseline Will return to office for outpatient follow up at discharge   Lab Results  Component Value Date   CREATININE 1.89 (H) 06/03/2021   CREATININE 1.95 (H) 06/02/2021   CREATININE 1.92 (H) 06/01/2021    Intake/Output Summary (Last 24 hours) at 06/03/2021 1144 Last data filed at 06/02/2021 1914 Gross per 24 hour  Intake 329.96 ml  Output 100 ml  Net 229.96 ml     2. Anemia of chronic kidney disease  Lab Results  Component Value Date   HGB 9.4 (L) 06/03/2021   Hgb at goal   3. Chronic hyponatremia Likely due to underlying cirrhosis Na level 129 (ranges 128-139 for her) Continue to encourage oral intake Reminded patient to restrict fluid intake and drink when thirsty.    4. Admitted post mechanical fall resulting in closed non displaced right femur fracture with supra patellar hemarthrosis. Discharging to SNF today    LOS: [place] 7/22/202211:44 AM

## 2021-06-03 NOTE — TOC Progression Note (Addendum)
Transition of Care [facility]) - Progression Note    Patient Details  Name: HAMNA ASA MRN: [id number] Date of Birth: Mar 17, 1951  Transition of Care [facility]) CM/SW Contact  Su Hilt, RN Phone Number: 06/03/2021, 8:48 AM  Clinical Narrative:   Reached out to Irven Shelling again to confirm that the patient can go to that facility of which the patient has insurance approval. Left a secure voice mail requesting a call back.  Update called [facility] and requested to speak to the administrator, they told me that he is not available, I asked to speak to the DON, she paged [facility] with admission and Jackelyn Poling stated that they will take the patient today and she will check on the room to make sure it is ready, I provided her with the number to call back   Expected Discharge Plan: [place] Barriers to Discharge: Continued Medical Work up  Expected Discharge Plan and Services Expected Discharge Plan: [place]   Discharge Planning Services: CM Consult     Expected Discharge Date: 06/02/21                                     Social Determinants of Health (SDOH) Interventions    Readmission Risk Interventions Readmission Risk Prevention Plan 04/28/2021 04/07/2021  Transportation Screening Complete Complete  Medication Review Press photographer) Complete Complete  PCP or Specialist appointment within 3-5 days of discharge - Complete  HRI or Home Care Consult Complete Complete  SW Recovery Care/Counseling Consult - Complete  Palliative Care Screening Not Applicable Not [place] Not Applicable Not Applicable  Some recent data might be hidden

## 2021-06-05 ENCOUNTER — Encounter (HOSPITAL_COMMUNITY): Payer: Self-pay | Admitting: *Deleted

## 2021-06-05 ENCOUNTER — Emergency Department (HOSPITAL_COMMUNITY)
Admission: EM | Admit: 2021-06-05 | Discharge: 2021-06-05 | Disposition: A | Discharge status: Home / Self Care | Payer: Medicare HMO | Attending: Emergency Medicine | Admitting: Emergency Medicine

## 2021-06-05 ENCOUNTER — Other Ambulatory Visit: Payer: Self-pay

## 2021-06-05 DIAGNOSIS — Z9889 Other specified postprocedural states: Secondary | ICD-10-CM

## 2021-06-05 DIAGNOSIS — E1122 Type 2 diabetes mellitus with diabetic chronic kidney disease: Secondary | ICD-10-CM | POA: Insufficient documentation

## 2021-06-05 DIAGNOSIS — Y848 Other medical procedures as the cause of abnormal reaction of the patient, or of later complication, without mention of misadventure at the time of the procedure: Secondary | ICD-10-CM | POA: Insufficient documentation

## 2021-06-05 DIAGNOSIS — R52 Pain, unspecified: Secondary | ICD-10-CM | POA: Diagnosis not present

## 2021-06-05 DIAGNOSIS — I5033 Acute on chronic diastolic (congestive) heart failure: Secondary | ICD-10-CM | POA: Diagnosis not present

## 2021-06-05 DIAGNOSIS — Z7401 Bed confinement status: Secondary | ICD-10-CM | POA: Diagnosis not present

## 2021-06-05 DIAGNOSIS — Z743 Need for continuous supervision: Secondary | ICD-10-CM | POA: Diagnosis not present

## 2021-06-05 DIAGNOSIS — W19XXXA Unspecified fall, initial encounter: Secondary | ICD-10-CM | POA: Diagnosis not present

## 2021-06-05 DIAGNOSIS — N184 Chronic kidney disease, stage 4 (severe): Secondary | ICD-10-CM | POA: Diagnosis not present

## 2021-06-05 DIAGNOSIS — T82534A Leakage of infusion catheter, initial encounter: Secondary | ICD-10-CM | POA: Insufficient documentation

## 2021-06-05 DIAGNOSIS — T8189XA Other complications of procedures, not elsewhere classified, initial encounter: Secondary | ICD-10-CM | POA: Diagnosis not present

## 2021-06-05 DIAGNOSIS — I2510 Atherosclerotic heart disease of native coronary artery without angina pectoris: Secondary | ICD-10-CM | POA: Diagnosis not present

## 2021-06-05 DIAGNOSIS — R531 Weakness: Secondary | ICD-10-CM | POA: Diagnosis not present

## 2021-06-05 MED ORDER — OXYCODONE-ACETAMINOPHEN 5-325 MG PO TABS
1.0000 | ORAL_TABLET | Freq: Once | ORAL | Status: AC
Start: 2021-06-05 — End: 2021-06-05
  Administered 2021-06-05: 1 via ORAL
  Filled 2021-06-05: qty 1

## 2021-06-05 NOTE — ED Triage Notes (Signed)
Pt brought here from [facility] for "transjugular intrahepatic portosystemic shunt"; pt has a port a cath in right chest, no drainage noted on dressing only dried drainage on gown

## 2021-06-05 NOTE — ED Provider Notes (Signed)
[facility] EMERGENCY DEPARTMENT Provider Note   CSN: [id number] Arrival date & time: 06/05/21  1347     History Chief Complaint  Patient presents with   Drainage from Incision    From port a cath    Crystal [name] is a 70 y.o. female.  HPI  Patient with significant medical history of anemia, B12 deficiency, CHF, cirrhosis, lung cancer presents to the emergency department with chief complaint of leaking Port-A-Cath.  Patient states it started to leak today, dripped out a little bit, and she had some pain around the site.  She does not endorse recent trauma to the area, redness around surgical sites, drainage or discharge, fevers or chills, she admits that her right leg hurts she recently had a nondisplaced femur fracture which not undergo surgical reduction, states she is normally on oxycodone.  it hurts today because she did not take her medication does not endorse paresthesias or weakness in that foot..  She is endorse chest pain, shortness of breath, abdominal pain, nausea vomiting diarrhea.  After reviewing patient's chart it appears that patient gets infusions of iron as well as B12 infusions, her most recent infusion was on the seventh, this is done at [facility] onc[facility], patient is unsure of when she goes there next.  Spoke with nursing staff from [facility], they endorse that the patient told them today that the catheter was leaking, they did not see anything leaking from the area, they did not endorse any change in patient's behavior, they do not endorse the patient having any other complaints at this time.  Past Medical History:  Diagnosis Date   Acute GI bleeding 02/09/2018   Acute upper gastrointestinal bleeding 08/03/2018   Anemia    TAKES IRON TAB   Arthritis    SHOULDER   B12 deficiency 02/18/2018   Bleeding    GI  3/19   Bronchitis    HX OF   CHF (congestive heart failure) (HCC)    Cirrhosis (HCC)    Colon cancer screening    Diabetes mellitus  without complication (HCC)    TYPE 2   Fatty (change of) liver, not elsewhere classified    Full dentures    UPPER AND LOWER   Iron deficiency anemia due to chronic blood loss 02/18/2018   Lung nodule, multiple    Pernicious anemia 02/22/2018   PUD (peptic ulcer disease)    Raynaud's syndrome    Upper GI bleeding 08/03/2018    Patient Active Problem List   Diagnosis Date Noted   Pressure injury of skin 05/30/2021   Closed fracture of distal end of right femur ([place]) 05/23/2021   Fracture of femur, distal, right, closed ([place]) 05/23/2021   Accidental fall 05/23/2021   Angiectasia 05/11/2021   GI bleeding 04/27/2021   Diabetes mellitus, type 2 ([place]) 04/27/2021   CAD (coronary artery disease) 04/27/2021   Chronic kidney disease, stage IV (severe) ([place]) 04/27/2021   DVT (deep venous thrombosis) ([place]) 04/27/2021   Acute blood loss anemia 04/27/2021   Acute deep vein thrombosis (DVT) of distal vein of lower extremity ([place]) 04/08/2021   Liver cirrhosis secondary to NASH ([place]) 04/08/2021   Thrombocytopenia ([place]) 04/08/2021   Chronic diastolic CHF (congestive heart failure) ([place]) 04/08/2021   Fluid retention 02/21/2021   Edema 12/09/2020   Acute on chronic diastolic heart failure ([place]) 12/09/2020   Pulmonary nodules 05/02/2020   Hepatic encephalopathy ([place]) 04/23/2020   Left ovarian cyst 10/06/2019   Hypokalemia 08/21/2019   AMS (  altered mental status) 08/20/2019   Acute osteomyelitis of phalanx of hand ([place]) 06/12/2019   Pain in finger of right hand 06/04/2019   Type 2 diabetes mellitus without complication, without long-term current use of insulin ([place]) 04/02/2019   Aortic atherosclerosis ([place]) 04/02/2019   Coronary artery disease involving native coronary artery of native heart without angina pectoris 04/02/2019   Acute on chronic heart failure with preserved ejection fraction (HFpEF) ([place]) 04/02/2019   Lung nodules    Volume overload 12/11/2018   GAVE (gastric antral vascular  ectasia)    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   Type 2 diabetes mellitus with stage 3 chronic kidney disease ([place]) 08/03/2018   Hx of esophageal varices    Other cirrhosis of liver ([place])    Angiodysplasia of small intestine ([place])    Adnexal cyst 03/16/2018   Goals of care, counseling/discussion 03/16/2018   Right middle lobe pulmonary nodule 03/14/2018   Right lower lobe pulmonary nodule 02/18/2018   Iron deficiency anemia due to chronic blood loss 02/18/2018   B12 deficiency 02/18/2018   Anticentromere antibodies present 02/18/2018   Hepatitis C virus infection without hepatic coma 02/18/2018    Past Surgical History:  Procedure Laterality Date   CATARACT EXTRACTION W/PHACO Right 02/06/2018   Procedure: CATARACT EXTRACTION PHACO AND INTRAOCULAR LENS PLACEMENT ([place]) RIGHT DIABETIC;  Surgeon: Leandrew Koyanagi, MD;  Location: [facility];  Service: Ophthalmology;  Laterality: Right;  DIABETIC, ORAL MED   CATARACT EXTRACTION W/PHACO Left 04/30/2018   Procedure: CATARACT EXTRACTION PHACO AND INTRAOCULAR LENS PLACEMENT (IOC);  Surgeon: Leandrew Koyanagi, MD;  Location: [facility];  Service: Ophthalmology;  Laterality: Left;  [place] 01:04 AP% 21.3 CDE 13.66 Fluid pack lot # [id number]   COLONOSCOPY WITH PROPOFOL N/A 03/25/2018   Procedure: COLONOSCOPY WITH PROPOFOL;  Surgeon: Lin Landsman, MD;  Location: [facility] ENDOSCOPY;  Service: Gastroenterology;  Laterality: N/A;   DENTAL SURGERY     EXTRACTIONS   ELECTROMAGNETIC NAVIGATION BROCHOSCOPY Right 01/27/2019   Procedure: ELECTROMAGNETIC NAVIGATION BRONCHOSCOPY;  Surgeon: [name], MD;  Location: [facility];  Service: Cardiopulmonary;  Laterality: Right;   ENTEROSCOPY N/A 07/28/2020   Procedure: Push ENTEROSCOPY;  Surgeon: Lin Landsman, MD;  Location: [facility] ENDOSCOPY;  Service: Gastroenterology;  Laterality: N/A;   ESOPHAGOGASTRODUODENOSCOPY N/A 08/03/2018   Procedure:  ESOPHAGOGASTRODUODENOSCOPY (EGD);  Surgeon: Lin Landsman, MD;  Location: [facility] ENDOSCOPY;  Service: Gastroenterology;  Laterality: N/A;   ESOPHAGOGASTRODUODENOSCOPY (EGD) WITH PROPOFOL N/A 02/10/2018   Procedure: ESOPHAGOGASTRODUODENOSCOPY (EGD) WITH PROPOFOL;  Surgeon: Jonathon Bellows, MD;  Location: [facility] ENDOSCOPY;  Service: Gastroenterology;  Laterality: N/A;   ESOPHAGOGASTRODUODENOSCOPY (EGD) WITH PROPOFOL N/A 03/25/2018   Procedure: ESOPHAGOGASTRODUODENOSCOPY (EGD) WITH PROPOFOL;  Surgeon: Lin Landsman, MD;  Location: [facility] ENDOSCOPY;  Service: Gastroenterology;  Laterality: N/A;   ESOPHAGOGASTRODUODENOSCOPY (EGD) WITH PROPOFOL N/A 04/22/2018   Procedure: ESOPHAGOGASTRODUODENOSCOPY (EGD) WITH PROPOFOL with band ligation;  Surgeon: Lin Landsman, MD;  Location: [facility];  Service: Gastroenterology;  Laterality: N/A;   ESOPHAGOGASTRODUODENOSCOPY (EGD) WITH PROPOFOL N/A 06/24/2018   Procedure: ESOPHAGOGASTRODUODENOSCOPY (EGD) WITH PROPOFOL;  Surgeon: Lin Landsman, MD;  Location: [facility] ENDOSCOPY;  Service: Gastroenterology;  Laterality: N/A;   ESOPHAGOGASTRODUODENOSCOPY (EGD) WITH PROPOFOL N/A 11/19/2018   Procedure: ESOPHAGOGASTRODUODENOSCOPY (EGD) WITH PROPOFOL;  Surgeon: Lin Landsman, MD;  Location: [facility];  Service: Gastroenterology;  Laterality: N/A;   ESOPHAGOGASTRODUODENOSCOPY (EGD) WITH PROPOFOL N/A 03/08/2020   Procedure: ESOPHAGOGASTRODUODENOSCOPY (EGD) WITH PROPOFOL;  Surgeon: Lin Landsman, MD;  Location: [facility] ENDOSCOPY;  Service: Gastroenterology;  Laterality: N/A;   ESOPHAGOGASTRODUODENOSCOPY (EGD) WITH PROPOFOL N/A 04/28/2021   Procedure: ESOPHAGOGASTRODUODENOSCOPY (EGD) WITH PROPOFOL;  Surgeon: Lin Landsman, MD;  Location: [facility] ENDOSCOPY;  Service: Gastroenterology;  Laterality: N/A;   EYE SURGERY     GIVENS CAPSULE STUDY N/A 03/29/2020   Procedure: GIVENS CAPSULE STUDY;  Surgeon: Lin Landsman, MD;  Location: [facility] ENDOSCOPY;   Service: Gastroenterology;  Laterality: N/A;   HEMORRHOID BANDING  03/25/2018   Procedure: HEMORRHOID BANDING;  Surgeon: Lin Landsman, MD;  Location: [facility] ENDOSCOPY;  Service: Gastroenterology;;   IR EMBO ART  VEN HEMORR LYMPH EXTRAV  INC GUIDE ROADMAPPING  08/07/2018   IR RADIOLOGIST EVAL & MGMT  09/03/2018   IR RADIOLOGIST EVAL & MGMT  12/17/2018   IR RADIOLOGIST EVAL & MGMT  06/24/2019   IR RADIOLOGIST EVAL & MGMT  01/22/2020   IR RADIOLOGIST EVAL & MGMT  02/09/2021   IR TIPS  08/07/2018   IR TRANSHEPATIC PORTOGRAM W HEMO  03/07/2021   IR US GUIDE VASC ACCESS RIGHT  03/07/2021   IR VENO/JUGULAR LEFT  03/07/2021   IVC FILTER INSERTION N/A 04/28/2021   Procedure: IVC FILTER INSERTION;  Surgeon: Algernon Huxley, MD;  Location: [facility] CV LAB;  Service: Cardiovascular;  Laterality: N/A;   PORTA CATH INSERTION N/A 07/30/2020   Procedure: PORTA CATH INSERTION;  Surgeon: Katha Cabal, MD;  Location: [place] CV LAB;  Service: Cardiovascular;  Laterality: N/A;   RADIOLOGY WITH ANESTHESIA N/A 08/07/2018   Procedure: TIPS;  Surgeon: Corrie Mckusick, DO;  Location: [facility];  Service: Anesthesiology;  Laterality: N/A;   RIGHT HEART CATH N/A 05/20/2021   Procedure: RIGHT HEART CATH;  Surgeon: Nelva Bush, MD;  Location: [facility] CV LAB;  Service: Cardiovascular;  Laterality: N/A;   TONSILLECTOMY       OB History   No obstetric history on file.     Family History  Problem Relation Age of Onset   CAD Father    Heart attack Father 47   Cancer Maternal Uncle    Seizures Mother     Social History   Tobacco Use   Smoking status: Never   Smokeless tobacco: Never  Vaping Use   Vaping Use: Never used  Substance Use Topics   Alcohol use: Not Currently    Comment: No EtOH for 30 years.  Never a heavy drinker.   Drug use: Never    Home Medications Prior to Admission medications   Medication Sig Start Date End Date Taking? Authorizing Provider  acetaminophen (TYLENOL) 500 MG  tablet Take 1 tablet (500 mg total) by mouth every 6 (six) hours as needed for mild pain, moderate pain, fever or headache. 06/02/21  Yes Val Riles, MD  amoxicillin-clavulanate (AUGMENTIN) 875-125 MG tablet Take 1 tablet by mouth 2 (two) times daily for 5 days. 06/02/21 06/07/21 Yes Val Riles, MD  benzonatate (TESSALON) 100 MG capsule Take 1 capsule (100 mg total) by mouth 4 (four) times daily as needed. 05/01/21  Yes Sreenath, Sudheer B, MD  furosemide (LASIX) 80 MG tablet Take 0.5 tablets (40 mg total) by mouth daily. 06/02/21  Yes Val Riles, MD  hydrOXYzine (ATARAX/VISTARIL) 25 MG tablet Take 1 tablet (25 mg total) by mouth 2 (two) times daily as needed. Patient taking differently: Take 25 mg by mouth 2 (two) times daily as needed for itching. 04/08/21  Yes Kathie Dike, MD  lactulose (CHRONULAC) 10 GM/15ML solution Take 30 mLs (20 g total) by mouth 3 (three) times daily.  04/08/21  Yes Kathie Dike, MD  ondansetron (ZOFRAN) 4 MG tablet Take 4 mg by mouth every 8 (eight) hours as needed for nausea or vomiting.   Yes [provider]  oxyCODONE (OXY IR/ROXICODONE) 5 MG immediate release tablet Take 1 tablet (5 mg total) by mouth every 6 (six) hours as needed for up to 7 days for moderate pain or severe pain. 06/02/21 06/09/21 Yes Val Riles, MD  pantoprazole (PROTONIX) 40 MG tablet Take 1 tablet (40 mg total) by mouth 2 (two) times daily. 05/01/21 06/05/21 Yes Sreenath, Sudheer B, MD  potassium chloride SA (KLOR-CON) 20 MEQ tablet Take 1 tablet (20 mEq total) by mouth daily. 06/02/21 09/30/21 Yes Val Riles, MD  spironolactone (ALDACTONE) 50 MG tablet Take 1 tablet (50 mg total) by mouth daily. 06/02/21  Yes Val Riles, MD  Tetrahydrozoline HCl (VISINE OP) Place 1 drop into both eyes daily as needed (irritaiton).   Yes [provider]  Vitamin D, Ergocalciferol, (DRISDOL) 1.25 MG (50000 UNIT) CAPS capsule Take 50,000 Units by mouth every Sunday. 01/14/21  Yes [provider]  metolazone (ZAROXOLYN) 5 MG tablet Take 1 tablet (5 mg total) by mouth every other day. 05/20/21 09/17/21  Nelva Bush, MD  Misc. Devices KIT Moderate compression hose 20-30 MM HG 10/14/19   Vanga, Tally Due, MD  OneTouch Delica Lancets 62H MISC 2 (two) times daily. 12/01/19   [provider]  [facility] ULTRA test strip 2 (two) times daily. 11/23/19   [provider]    Allergies    Patient has no known allergies.  Review of Systems   Review of Systems  Constitutional:  Negative for chills and fever.  HENT:  Negative for congestion.   Respiratory:  Negative for shortness of breath.   Cardiovascular:  Negative for chest pain.  Gastrointestinal:  Negative for abdominal pain.  Genitourinary:  Negative for enuresis.  Musculoskeletal:  Negative for back pain.       Leg pain, Port-A-Cath leaking  Skin:  Negative for rash.  Neurological:  Negative for dizziness.  Hematological:  Does not bruise/bleed easily.   Physical Exam Updated Vital Signs BP (!) 113/59 (BP Location: Left Arm)   Pulse 93   Temp 98.7 F (37.1 C) (Oral)   Resp 18   Ht 5' 2" (1.575 m)   Wt 83.9 kg   LMP  (LMP Unknown)   SpO2 92%   BMI 33.84 kg/m   Physical Exam Vitals and nursing note reviewed.  Constitutional:      General: She is not in acute distress.    Appearance: She is not ill-appearing.  HENT:     Head: Normocephalic and atraumatic.     Nose: No congestion.  Eyes:     Conjunctiva/sclera: Conjunctivae normal.  Cardiovascular:     Rate and Rhythm: Normal rate and regular rhythm.  Pulmonary:     Effort: Pulmonary effort is normal.  Musculoskeletal:        General: Normal range of motion.     Comments: Patient's right leg was visualized slight slight edema no erythema, neurovascular tact in lower extremities.  Skin:    General: Skin is warm and dry.     Comments: Patient has a noted Port-A-Cath in the right upper chest, there is no surrounding erythema, edema,  drainage or discharge present, catheter was not leaking, there is a small amount of blood within the tubing, there is no present defects on the catheter itself.  Area was palpated it was nontender  to palpation, no fluctuance or induration noted.  Neurological:     Mental Status: She is alert.  Psychiatric:        Mood and Affect: Mood normal.    ED Results / Procedures / Treatments   Labs (all labs ordered are listed, but only abnormal results are displayed) Labs Reviewed - No data to display  EKG None  Radiology No results found.  Procedures Procedures   Medications Ordered in ED Medications  oxyCODONE-acetaminophen (PERCOCET/ROXICET) 5-325 MG per tablet 1 tablet (1 tablet Oral Given 06/05/21 1716)    ED Course  I have reviewed the triage vital signs and the nursing notes.  Pertinent labs & imaging results that were available during my care of the patient were reviewed by me and considered in my medical decision making (see chart for details).    MDM Rules/Calculators/A&P                          Initial impression-patient presents with a leaking catheter she is alert, does not appear in distress, vital signs reassuring.  Work-up-due to well-appearing patient, benign for exam, further lab or imaging not want at this time.  Rule out-low suspicion for active leaking catheter there is no drainage from the catheter itself, there is no noted defects within the catheter itself.  Low suspicion for infected catheter as there is no surrounding erythema or edema, no drainage or discharge present, patient is nontoxic-appearing, vital signs reassuring.  Low suspicion for misplaced tubing creating a pneumothorax as patient has no chest pain, no shortness breath, vital signs reassuring, will defer imaging at this time.  Plan-  Catheter check-unclear etiology of substance on patient's shirt, not actively draining on my exam, will have her follow-up with her oncologist to determine if  catheter replacement is needed.  Vital signs have remained stable, no indication for hospital admission.  Patient discussed with attending and they agreed with assessment and plan.  Patient given at home care as well strict return precautions.  Patient verbalized that they understood agreed to said plan.  Final Clinical Impression(s) / ED Diagnoses Final diagnoses:  History of insertion of tunneled central venous catheter (CVC) with port    Rx / DC Orders ED Discharge Orders     None        Marcello Fennel, PA-C 06/05/21 1717    Milton Ferguson, MD 06/06/21 1043

## 2021-06-05 NOTE — Discharge Instructions (Addendum)
I do not see any leakage or damage to the tubing of the catheter or site itself.  I recommends following up with the oncologist who is currently using the site for infusions.  They will have nurses that can come in and check the Port-A-Cath itself.  Please call your oncologist for further evaluation.  Come back to the emergency department if you develop chest pain, shortness of breath, severe abdominal pain, uncontrolled nausea, vomiting, diarrhea.

## 2021-06-07 DIAGNOSIS — S72001A Fracture of unspecified part of neck of right femur, initial encounter for closed fracture: Secondary | ICD-10-CM | POA: Diagnosis not present

## 2021-06-09 ENCOUNTER — Other Ambulatory Visit: Payer: Self-pay

## 2021-06-09 ENCOUNTER — Telehealth: Payer: Self-pay | Admitting: Gastroenterology

## 2021-06-09 ENCOUNTER — Inpatient Hospital Stay (HOSPITAL_COMMUNITY)
Admission: EM | Admit: 2021-06-09 | Discharge: 2021-06-12 | DRG: 815 | Disposition: A | Discharge status: Home Health | Payer: Medicare HMO | Attending: Internal Medicine | Admitting: Internal Medicine

## 2021-06-09 ENCOUNTER — Encounter (HOSPITAL_COMMUNITY): Payer: Self-pay | Admitting: Emergency Medicine

## 2021-06-09 ENCOUNTER — Emergency Department (HOSPITAL_COMMUNITY): Payer: Medicare HMO

## 2021-06-09 DIAGNOSIS — K7581 Nonalcoholic steatohepatitis (NASH): Secondary | ICD-10-CM | POA: Diagnosis present

## 2021-06-09 DIAGNOSIS — D735 Infarction of spleen: Principal | ICD-10-CM | POA: Diagnosis present

## 2021-06-09 DIAGNOSIS — Z8249 Family history of ischemic heart disease and other diseases of the circulatory system: Secondary | ICD-10-CM

## 2021-06-09 DIAGNOSIS — Z8711 Personal history of peptic ulcer disease: Secondary | ICD-10-CM

## 2021-06-09 DIAGNOSIS — W19XXXD Unspecified fall, subsequent encounter: Secondary | ICD-10-CM | POA: Diagnosis present

## 2021-06-09 DIAGNOSIS — D6859 Other primary thrombophilia: Secondary | ICD-10-CM | POA: Diagnosis present

## 2021-06-09 DIAGNOSIS — S72401D Unspecified fracture of lower end of right femur, subsequent encounter for closed fracture with routine healing: Secondary | ICD-10-CM | POA: Diagnosis not present

## 2021-06-09 DIAGNOSIS — E119 Type 2 diabetes mellitus without complications: Secondary | ICD-10-CM

## 2021-06-09 DIAGNOSIS — I7300 Raynaud's syndrome without gangrene: Secondary | ICD-10-CM | POA: Diagnosis present

## 2021-06-09 DIAGNOSIS — S72401A Unspecified fracture of lower end of right femur, initial encounter for closed fracture: Secondary | ICD-10-CM | POA: Diagnosis present

## 2021-06-09 DIAGNOSIS — N185 Chronic kidney disease, stage 5: Secondary | ICD-10-CM | POA: Diagnosis not present

## 2021-06-09 DIAGNOSIS — E1122 Type 2 diabetes mellitus with diabetic chronic kidney disease: Secondary | ICD-10-CM | POA: Diagnosis present

## 2021-06-09 DIAGNOSIS — Z95828 Presence of other vascular implants and grafts: Secondary | ICD-10-CM

## 2021-06-09 DIAGNOSIS — K7682 Hepatic encephalopathy: Secondary | ICD-10-CM | POA: Diagnosis present

## 2021-06-09 DIAGNOSIS — I959 Hypotension, unspecified: Secondary | ICD-10-CM | POA: Diagnosis not present

## 2021-06-09 DIAGNOSIS — I2510 Atherosclerotic heart disease of native coronary artery without angina pectoris: Secondary | ICD-10-CM | POA: Diagnosis present

## 2021-06-09 DIAGNOSIS — K766 Portal hypertension: Secondary | ICD-10-CM | POA: Diagnosis not present

## 2021-06-09 DIAGNOSIS — Z8719 Personal history of other diseases of the digestive system: Secondary | ICD-10-CM

## 2021-06-09 DIAGNOSIS — Z6835 Body mass index (BMI) 35.0-35.9, adult: Secondary | ICD-10-CM

## 2021-06-09 DIAGNOSIS — S72001A Fracture of unspecified part of neck of right femur, initial encounter for closed fracture: Secondary | ICD-10-CM | POA: Diagnosis not present

## 2021-06-09 DIAGNOSIS — R0600 Dyspnea, unspecified: Secondary | ICD-10-CM

## 2021-06-09 DIAGNOSIS — Z794 Long term (current) use of insulin: Secondary | ICD-10-CM | POA: Diagnosis not present

## 2021-06-09 DIAGNOSIS — K7469 Other cirrhosis of liver: Secondary | ICD-10-CM | POA: Diagnosis present

## 2021-06-09 DIAGNOSIS — K7290 Hepatic failure, unspecified without coma: Secondary | ICD-10-CM | POA: Diagnosis not present

## 2021-06-09 DIAGNOSIS — I82431 Acute embolism and thrombosis of right popliteal vein: Secondary | ICD-10-CM | POA: Diagnosis present

## 2021-06-09 DIAGNOSIS — Z79899 Other long term (current) drug therapy: Secondary | ICD-10-CM

## 2021-06-09 DIAGNOSIS — R509 Fever, unspecified: Secondary | ICD-10-CM | POA: Diagnosis not present

## 2021-06-09 DIAGNOSIS — I5032 Chronic diastolic (congestive) heart failure: Secondary | ICD-10-CM | POA: Diagnosis not present

## 2021-06-09 DIAGNOSIS — K31819 Angiodysplasia of stomach and duodenum without bleeding: Secondary | ICD-10-CM | POA: Diagnosis present

## 2021-06-09 DIAGNOSIS — Z743 Need for continuous supervision: Secondary | ICD-10-CM | POA: Diagnosis not present

## 2021-06-09 DIAGNOSIS — E871 Hypo-osmolality and hyponatremia: Secondary | ICD-10-CM | POA: Diagnosis present

## 2021-06-09 DIAGNOSIS — I8510 Secondary esophageal varices without bleeding: Secondary | ICD-10-CM | POA: Diagnosis present

## 2021-06-09 DIAGNOSIS — Z20822 Contact with and (suspected) exposure to covid-19: Secondary | ICD-10-CM | POA: Diagnosis present

## 2021-06-09 DIAGNOSIS — N184 Chronic kidney disease, stage 4 (severe): Secondary | ICD-10-CM | POA: Diagnosis present

## 2021-06-09 DIAGNOSIS — N183 Chronic kidney disease, stage 3 unspecified: Secondary | ICD-10-CM | POA: Diagnosis present

## 2021-06-09 DIAGNOSIS — I82502 Chronic embolism and thrombosis of unspecified deep veins of left lower extremity: Secondary | ICD-10-CM | POA: Diagnosis present

## 2021-06-09 DIAGNOSIS — D500 Iron deficiency anemia secondary to blood loss (chronic): Secondary | ICD-10-CM | POA: Diagnosis present

## 2021-06-09 DIAGNOSIS — I8001 Phlebitis and thrombophlebitis of superficial vessels of right lower extremity: Secondary | ICD-10-CM | POA: Diagnosis present

## 2021-06-09 DIAGNOSIS — M19019 Primary osteoarthritis, unspecified shoulder: Secondary | ICD-10-CM | POA: Diagnosis present

## 2021-06-09 DIAGNOSIS — R109 Unspecified abdominal pain: Secondary | ICD-10-CM | POA: Diagnosis not present

## 2021-06-09 DIAGNOSIS — R1084 Generalized abdominal pain: Secondary | ICD-10-CM | POA: Diagnosis not present

## 2021-06-09 LAB — COMPREHENSIVE METABOLIC PANEL
ALT: 16 U/L (ref 0–44)
AST: 27 U/L (ref 15–41)
Albumin: 1.7 g/dL — ABNORMAL LOW (ref 3.5–5.0)
Alkaline Phosphatase: 165 U/L — ABNORMAL HIGH (ref 38–126)
Anion gap: 7 (ref 5–15)
BUN: 48 mg/dL — ABNORMAL HIGH (ref 8–23)
CO2: 24 mmol/L (ref 22–32)
Calcium: 7.8 mg/dL — ABNORMAL LOW (ref 8.9–10.3)
Chloride: 96 mmol/L — ABNORMAL LOW (ref 98–111)
Creatinine, Ser: 1.65 mg/dL — ABNORMAL HIGH (ref 0.44–1.00)
GFR, Estimated: 33 mL/min — ABNORMAL LOW (ref >60)
Glucose, Bld: 181 mg/dL — ABNORMAL HIGH (ref 70–99)
Potassium: 4.3 mmol/L (ref 3.5–5.1)
Sodium: 127 mmol/L — ABNORMAL LOW (ref 135–145)
Total Bilirubin: 1.2 mg/dL (ref 0.3–1.2)
Total Protein: 5.7 g/dL — ABNORMAL LOW (ref 6.5–8.1)

## 2021-06-09 LAB — CBC WITH DIFFERENTIAL/PLATELET
Abs Immature Granulocytes: 0.05 10*3/uL (ref 0.00–0.07)
Basophils Absolute: 0 10*3/uL (ref 0.0–0.1)
Basophils Relative: 0 %
Eosinophils Absolute: 0.1 10*3/uL (ref 0.0–0.5)
Eosinophils Relative: 1 %
HCT: 30.7 % — ABNORMAL LOW (ref 36.0–46.0)
Hemoglobin: 9.7 g/dL — ABNORMAL LOW (ref 12.0–15.0)
Immature Granulocytes: 1 %
Lymphocytes Relative: 7 %
Lymphs Abs: 0.6 10*3/uL — ABNORMAL LOW (ref 0.7–4.0)
MCH: 27.2 pg (ref 26.0–34.0)
MCHC: 31.6 g/dL (ref 30.0–36.0)
MCV: 86 fL (ref 80.0–100.0)
Monocytes Absolute: 0.9 10*3/uL (ref 0.1–1.0)
Monocytes Relative: 10 %
Neutro Abs: 6.7 10*3/uL (ref 1.7–7.7)
Neutrophils Relative %: 81 %
Platelets: 217 10*3/uL (ref 150–400)
RBC: 3.57 MIL/uL — ABNORMAL LOW (ref 3.87–5.11)
RDW: 18.9 % — ABNORMAL HIGH (ref 11.5–15.5)
WBC: 8.3 10*3/uL (ref 4.0–10.5)
nRBC: 0 % (ref 0.0–0.2)

## 2021-06-09 LAB — RESP PANEL BY RT-PCR (FLU A&B, COVID) ARPGX2
Influenza A by PCR: NEGATIVE
Influenza B by PCR: NEGATIVE
SARS Coronavirus 2 by RT PCR: NEGATIVE

## 2021-06-09 LAB — CBG MONITORING, ED: Glucose-Capillary: 117 mg/dL — ABNORMAL HIGH (ref 70–99)

## 2021-06-09 LAB — LIPASE, BLOOD: Lipase: 69 U/L — ABNORMAL HIGH (ref 11–51)

## 2021-06-09 MED ORDER — SPIRONOLACTONE 25 MG PO TABS
50.0000 mg | ORAL_TABLET | Freq: Every day | ORAL | Status: DC
  Administered 2021-06-10 – 2021-06-12 (×3): 50 mg via ORAL
  Filled 2021-06-09 (×3): qty 2

## 2021-06-09 MED ORDER — LACTULOSE 10 GM/15ML PO SOLN
20.0000 g | Freq: Three times a day (TID) | ORAL | Status: DC
  Administered 2021-06-09 – 2021-06-12 (×10): 20 g via ORAL
  Filled 2021-06-09 (×10): qty 30

## 2021-06-09 MED ORDER — ONDANSETRON HCL 4 MG/2ML IJ SOLN
4.0000 mg | Freq: Once | INTRAMUSCULAR | Status: AC
  Administered 2021-06-09: 4 mg via INTRAVENOUS
  Filled 2021-06-09: qty 2

## 2021-06-09 MED ORDER — MORPHINE SULFATE (PF) 2 MG/ML IV SOLN
2.0000 mg | INTRAVENOUS | Status: DC | PRN
  Administered 2021-06-09 – 2021-06-11 (×7): 2 mg via INTRAVENOUS
  Filled 2021-06-09 (×8): qty 1

## 2021-06-09 MED ORDER — IOHEXOL 300 MG/ML  SOLN
75.0000 mL | Freq: Once | INTRAMUSCULAR | Status: AC | PRN
  Administered 2021-06-09: 75 mL via INTRAVENOUS

## 2021-06-09 MED ORDER — METOLAZONE 5 MG PO TABS
5.0000 mg | ORAL_TABLET | Freq: Every day | ORAL | Status: DC
  Administered 2021-06-10 – 2021-06-12 (×3): 5 mg via ORAL
  Filled 2021-06-09 (×3): qty 1

## 2021-06-09 MED ORDER — ACETAMINOPHEN 650 MG RE SUPP: 650.0000 mg | Freq: Four times a day (QID) | RECTAL | Status: DC | PRN

## 2021-06-09 MED ORDER — FUROSEMIDE 40 MG PO TABS
40.0000 mg | ORAL_TABLET | Freq: Every day | ORAL | Status: DC
  Administered 2021-06-10 – 2021-06-11 (×2): 40 mg via ORAL
  Filled 2021-06-09 (×3): qty 1

## 2021-06-09 MED ORDER — PANTOPRAZOLE SODIUM 40 MG PO TBEC
40.0000 mg | DELAYED_RELEASE_TABLET | Freq: Two times a day (BID) | ORAL | Status: DC
  Administered 2021-06-09 – 2021-06-12 (×7): 40 mg via ORAL
  Filled 2021-06-09 (×7): qty 1

## 2021-06-09 MED ORDER — SODIUM CHLORIDE 0.9 % IV BOLUS
1000.0000 mL | Freq: Once | INTRAVENOUS | Status: AC
  Administered 2021-06-09: 1000 mL via INTRAVENOUS

## 2021-06-09 MED ORDER — ONDANSETRON HCL 4 MG PO TABS: 4.0000 mg | ORAL_TABLET | Freq: Four times a day (QID) | ORAL | Status: DC | PRN

## 2021-06-09 MED ORDER — POLYETHYLENE GLYCOL 3350 17 G PO PACK: 17.0000 g | PACK | Freq: Every day | ORAL | Status: DC | PRN

## 2021-06-09 MED ORDER — HYDROMORPHONE HCL 1 MG/ML IJ SOLN
0.5000 mg | Freq: Once | INTRAMUSCULAR | Status: AC
Start: 2021-06-09 — End: 2021-06-09
  Administered 2021-06-09: 0.5 mg via INTRAVENOUS
  Filled 2021-06-09: qty 1

## 2021-06-09 MED ORDER — ONDANSETRON HCL 4 MG/2ML IJ SOLN: 4.0000 mg | Freq: Four times a day (QID) | INTRAMUSCULAR | Status: DC | PRN

## 2021-06-09 MED ORDER — SODIUM CHLORIDE 0.9 % IV BOLUS
500.0000 mL | Freq: Once | INTRAVENOUS | Status: AC
  Administered 2021-06-09: 500 mL via INTRAVENOUS

## 2021-06-09 MED ORDER — ACETAMINOPHEN 325 MG PO TABS: 650.0000 mg | ORAL_TABLET | Freq: Four times a day (QID) | ORAL | Status: DC | PRN

## 2021-06-09 MED ORDER — HEPARIN SODIUM (PORCINE) 5000 UNIT/ML IJ SOLN
5000.0000 [IU] | Freq: Three times a day (TID) | INTRAMUSCULAR | Status: DC
  Administered 2021-06-09 – 2021-06-12 (×9): 5000 [IU] via SUBCUTANEOUS
  Filled 2021-06-09 (×9): qty 1

## 2021-06-09 NOTE — ED Triage Notes (Signed)
Pt arrives via [facility] with complaints of abdominal pain for "several days" and states that she believes this is an issues with her "transjugular intrahepatic portosystemic shunt", which she was seen on 7/24 for same. Also complains of headache.

## 2021-06-09 NOTE — Telephone Encounter (Signed)
Patient would like to speak with Dr. Marius Ditch to discuss the issues she's having with her shut and to inform her that she's currently in the hospital and those people don't understand what she's talking about, per patient. Clinical staff will follow up with patient.

## 2021-06-09 NOTE — H&P (Signed)
History and Physical    COSIMA PRENTISS DVV:[id number] DOB: Jul 05, 1951 DOA: 06/09/2021  PCP: Caprice Renshaw, MD   Patient coming from: [place]  I have personally briefly reviewed patient's old medical records in [place]  Chief Complaint: Abdominal Pain  HPI: Crystal [name] is a 70 y.o. female with medical history significant for NASH liver cirrhosis with esophageal varices, history of encephalopathy and TIPS procedure, diastolic CHF, PUD/upper GI bleed, Raynaud's phenomenon, coronary artery disease, hepatitis C. Patient presented to the ED with complaints of generalized persistent abdominal pain of 2 days duration.  No vomiting, no loose stools.  Patient reports a history of blood clots in her bilateral lower extremities.  Recent hospitalization 7/11-7/22 for distal nondisplaced right femur fracture, patient was managed nonoperatively due to significant perioperative risk.  Also treated for hepatic encephalopathy with lactulose enemas, urinary tract infection with IV ceftriaxone transition to oral Augmentin.  Hospitalization 6/15- 05/01/21 for anemia due to GI blood loss, Hgb was 5.9 required 2 units of blood, also treated with IV pantoprazole and octreotide.  EGD 6/16 showed single nonbleeding angiectasia in the stomach, severe diffuse antral vascular ectasia both treated with argon plasma coagulation.  Was also treated for aspiration pneumonia during the hospitalization.  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  Repeat Ultrasound 05/19/2021 demonstrated subacute DVT, and acute occlusive superficial thrombophlebitis of the right small saphenous vein. Patient was started on anticoagulation with Eliquis which she took for just a week before she presented to the ED 04/2021 with evidence of GI blood loss.  Anticoagulation was discontinued.  An IVC filter was placed by vascular surgeon Dr. Lucky Cowboy 6/16.  ED Course: Stable vitals.   Sodium 127.  Creatinine stable 1.65.  Hgb 9.7.  Platelets 217.  Abdominal CT showed new splenic infarct, patent TIPS, partially visualized moderate loculated right pleural effusion. EDP talked to GI, who will see the patient in the hospital, also recommended talking to general surgery.  Review of Systems: As per HPI all other systems reviewed and negative.  Past Medical History:  Diagnosis Date   Acute GI bleeding 02/09/2018   Acute upper gastrointestinal bleeding 08/03/2018   Anemia    TAKES IRON TAB   Arthritis    SHOULDER   B12 deficiency 02/18/2018   Bleeding    GI  3/19   Bronchitis    HX OF   CHF (congestive heart failure) (HCC)    Cirrhosis (HCC)    Colon cancer screening    Diabetes mellitus without complication ([place])    TYPE 2   Fatty (change of) liver, not elsewhere classified    Full dentures    UPPER AND LOWER   Iron deficiency anemia due to chronic blood loss 02/18/2018   Lung nodule, multiple    Pernicious anemia 02/22/2018   PUD (peptic ulcer disease)    Raynaud's syndrome    Upper GI bleeding 08/03/2018    Past Surgical History:  Procedure Laterality Date   CATARACT EXTRACTION W/PHACO Right 02/06/2018   Procedure: CATARACT EXTRACTION PHACO AND INTRAOCULAR LENS PLACEMENT ([place]) RIGHT DIABETIC;  Surgeon: Leandrew Koyanagi, MD;  Location: [place];  Service: Ophthalmology;  Laterality: Right;  DIABETIC, ORAL MED   CATARACT EXTRACTION W/PHACO Left 04/30/2018   Procedure: CATARACT EXTRACTION PHACO AND INTRAOCULAR LENS PLACEMENT (I[facility]);  Surgeon: Leandrew Koyanagi, MD;  Location: [facility];  Service: Ophthalmology;  Laterality: Left;  [place] 01:04 AP% 21.3 CDE 13.66  Fluid pack lot # [id number]   COLONOSCOPY WITH PROPOFOL N/A 03/25/2018   Procedure: COLONOSCOPY WITH PROPOFOL;  Surgeon: Lin Landsman, MD;  Location: [facility] END[facility]COPY;  Service: Gastroenterology;  Laterality: N/A;   DENTAL SURGERY     EXTRACTIONS   ELECTROMAGNETIC NAVIGATION BROCHOSCOPY Right  01/27/2019   Procedure: ELECTROMAGNETIC NAVIGATION BRONCHOSCOPY;  Surgeon: [name], MD;  Location: [facility];  Service: Cardiopulmonary;  Laterality: Right;   ENTEROSCOPY N/A 07/28/2020   Procedure: Push ENTEROSCOPY;  Surgeon: Lin Landsman, MD;  Location: [facility];  Service: Gastroenterology;  Laterality: N/A;   ESOPHAGOGASTRODUODENOSCOPY N/A 08/03/2018   Procedure: ESOPHAGOGASTRODUODENOSCOPY (EGD);  Surgeon: Lin Landsman, MD;  Location: [facility] ENDOSCOPY;  Service: Gastroenterology;  Laterality: N/A;   ESOPHAGOGASTRODUODENOSCOPY (EGD) WITH PROPOFOL N/A 02/10/2018   Procedure: ESOPHAGOGASTRODUODENOSCOPY (EGD) WITH PROPOFOL;  Surgeon: Jonathon Bellows, MD;  Location: [facility] ENDOSCOPY;  Service: Gastroenterology;  Laterality: N/A;   ESOPHAGOGASTRODUODENOSCOPY (EGD) WITH PROPOFOL N/A 03/25/2018   Procedure: ESOPHAGOGASTRODUODENOSCOPY (EGD) WITH PROPOFOL;  Surgeon: Lin Landsman, MD;  Location: [facility] ENDOSCOPY;  Service: Gastroenterology;  Laterality: N/A;   ESOPHAGOGASTRODUODENOSCOPY (EGD) WITH PROPOFOL N/A 04/22/2018   Procedure: ESOPHAGOGASTRODUODENOSCOPY (EGD) WITH PROPOFOL with band ligation;  Surgeon: Lin Landsman, MD;  Location: [place];  Service: Gastroenterology;  Laterality: N/A;   ESOPHAGOGASTRODUODENOSCOPY (EGD) WITH PROPOFOL N/A 06/24/2018   Procedure: ESOPHAGOGASTRODUODENOSCOPY (EGD) WITH PROPOFOL;  Surgeon: Lin Landsman, MD;  Location: [facility] ENDOSCOPY;  Service: Gastroenterology;  Laterality: N/A;   ESOPHAGOGASTRODUODENOSCOPY (EGD) WITH PROPOFOL N/A 11/19/2018   Procedure: ESOPHAGOGASTRODUODENOSCOPY (EGD) WITH PROPOFOL;  Surgeon: Lin Landsman, MD;  Location: [place];  Service: Gastroenterology;  Laterality: N/A;   ESOPHAGOGASTRODUODENOSCOPY (EGD) WITH PROPOFOL N/A 03/08/2020   Procedure: ESOPHAGOGASTRODUODENOSCOPY (EGD) WITH PROPOFOL;  Surgeon: Lin Landsman, MD;  Location: [facility] ENDOSCOPY;  Service: Gastroenterology;  Laterality: N/A;    ESOPHAGOGASTRODUODENOSCOPY (EGD) WITH PROPOFOL N/A 04/28/2021   Procedure: ESOPHAGOGASTRODUODENOSCOPY (EGD) WITH PROPOFOL;  Surgeon: Lin Landsman, MD;  Location[facility];  Service: Gastroenterology;  Laterality: N/A;   EYE SURGERY     GIVENS CAPSULE STUDY N/A 03/29/2020   Procedure: GIVENS CAPSULE STUDY;  Surgeon: Lin Landsman, MD;  Location: [facility] ENDOSCOPY;  Service: Gastroenterology;  Laterality: N/A;   HEMORRHOID BANDING  03/25/2018   Procedure: HEMORRHOID BANDING;  Surgeon: Lin Landsman, MD;  Location: [facility] ENDOSCOPY;  Service: Gastroenterology;;   IR EMBO ART  VEN HEMORR LYMPH EXTRAV  INC GUIDE ROADMAPPING  08/07/2018   IR RADIOLOGIST EVAL & MGMT  09/03/2018   IR RADIOLOGIST EVAL & MGMT  12/17/2018   IR RADIOLOGIST EVAL & MGMT  06/24/2019   IR RADIOLOGIST EVAL & MGMT  01/22/2020   IR RADIOLOGIST EVAL & MGMT  02/09/2021   IR TIPS  08/07/2018   IR TRANSHEPATIC PORTOGRAM W HEMO  03/07/2021   IR US GUIDE VASC ACCESS RIGHT  03/07/2021   IR VENO/JUGULAR LEFT  03/07/2021   IVC FILTER INSERTION N/A 04/28/2021   Procedure: IVC FILTER INSERTION;  Surgeon: Algernon Huxley, MD;  Location: [place] CV LAB;  Service: Cardiovascular;  Laterality: N/A;   PORTA CATH INSERTION N/A 07/30/2020   Procedure: PORTA CATH INSERTION;  Surgeon: Katha Cabal, MD;  Location: [place] CV LAB;  Service: Cardiovascular;  Laterality: N/A;   RADIOLOGY WITH ANESTHESIA N/A 08/07/2018   Procedure: TIPS;  Surgeon: Corrie Mckusick, DO;  Location: [place];  Service: Anesthesiology;  Laterality: N/A;   RIGHT HEART CATH N/A 05/20/2021   Procedure: RIGHT HEART CATH;  Surgeon: End,  Harrell Gave, MD;  Location: [place] CV LAB;  Service: Cardiovascular;  Laterality: N/A;   TONSILLECTOMY       reports that she has never smoked. She has never used smokeless tobacco. She reports previous alcohol use. She reports that she does not use drugs.  No Known Allergies  Family History  Problem Relation Age of  Onset   CAD Father    Heart attack Father 59   Cancer Maternal Uncle    Seizures Mother     Prior to Admission medications   Medication Sig Start Date End Date Taking? Authorizing Provider  acetaminophen (TYLENOL) 500 MG tablet Take 1 tablet (500 mg total) by mouth every 6 (six) hours as needed for mild pain, moderate pain, fever or headache. 06/02/21  Yes Val Riles, MD  amoxicillin-clavulanate (AUGMENTIN) 875-125 MG tablet Take 1 tablet by mouth 2 (two) times daily. For 5 days 06/03/21  Yes [provider]  furosemide (LASIX) 80 MG tablet Take 0.5 tablets (40 mg total) by mouth daily. 06/02/21  Yes Val Riles, MD  gabapentin (NEURONTIN) 100 MG capsule Take 100 mg by mouth daily. For 3 days   Yes [provider]  hydrOXYzine (ATARAX/VISTARIL) 25 MG tablet Take 1 tablet (25 mg total) by mouth 2 (two) times daily as needed. Patient taking differently: Take 25 mg by mouth 2 (two) times daily as needed for itching. 04/08/21  Yes Kathie Dike, MD  lactulose (CHRONULAC) 10 GM/15ML solution Take 30 mLs (20 g total) by mouth 3 (three) times daily. 04/08/21  Yes Kathie Dike, MD  metolazone (ZAROXOLYN) 5 MG tablet Take 1 tablet (5 mg total) by mouth every other day. Patient taking differently: Take 5 mg by mouth daily. 05/20/21 09/17/21 Yes End, Harrell Gave, MD  ondansetron (ZOFRAN) 4 MG tablet Take 4 mg by mouth every 8 (eight) hours as needed for nausea or vomiting.   Yes [provider]  pantoprazole (PROTONIX) 40 MG tablet Take 1 tablet (40 mg total) by mouth 2 (two) times daily. 05/01/21 06/09/21 Yes Sreenath, Sudheer B, MD  potassium chloride SA (KLOR-CON) 20 MEQ tablet Take 1 tablet (20 mEq total) by mouth daily. 06/02/21 09/30/21 Yes Val Riles, MD  spironolactone (ALDACTONE) 50 MG tablet Take 1 tablet (50 mg total) by mouth daily. 06/02/21  Yes Val Riles, MD  Vitamin D, Ergocalciferol, (DRISDOL) 1.25 MG (50000 UNIT) CAPS capsule Take 50,000 Units by mouth  every Sunday. 01/14/21  Yes [provider]  benzonatate (TESSALON) 100 MG capsule Take 1 capsule (100 mg total) by mouth 4 (four) times daily as needed. Patient not taking: No sig reported 05/01/21   Ralene Muskrat B, MD  oxyCODONE (OXY IR/ROXICODONE) 5 MG immediate release tablet Take 1 tablet (5 mg total) by mouth every 6 (six) hours as needed for up to 7 days for moderate pain or severe pain. Patient not taking: No sig reported 06/02/21 06/09/21  Val Riles, MD    Physical Exam: Vitals:   06/09/21 1200 06/09/21 1230 06/09/21 1300 06/09/21 1330  BP: (!) 116/58 (!) 109/59 120/60 122/60  Pulse: 87 76 86 85  Resp: (!) _0 Temp:      TempSrc:      SpO2: 99% 98% 99% 100%  Weight:      Height:        Constitutional: NAD, calm, comfortable Vitals:   06/09/21 1200 06/09/21 1230 06/09/21 1300 06/09/21 1330  BP: (!) 116/58 (!) 109/59 120/60 122/60  Pulse: 87 76 86 85  Resp: (!) _0 Temp:      TempSrc:      SpO2: 99% 98% 99% 100%  Weight:      Height:       Eyes: PERRL, lids and conjunctivae normal ENMT: Mucous membranes are dry.  Neck: normal, supple, no masses, no thyromegaly Respiratory: clear to auscultation bilaterally, no wheezing, no crackles. Normal respiratory effort. No accessory muscle use.  Cardiovascular: Regular rate and rhythm, no murmurs / rubs / gallops. Trace bilat LE edema. 2+ pedal pulses. No carotid bruits.  Abdomen: diffuse abdominal tenderness, no masses palpated. No hepatosplenomegaly. Bowel sounds positive.  Musculoskeletal: no clubbing / cyanosis.  Right lower extremity knee brace present.  Tips of fingers cold slightly purplish consistent with patient's history of Raynaud's. Skin: no rashes, lesions, ulcers. No induration Neurologic: No apparent cranial nerve abnormality, 5 of 5 strength bilateral upper extremity, 4/5 strength left LE 2/2 pain.  Psychiatric: Normal judgment and insight. Alert and oriented x 3. Normal mood.    Labs on Admission: I have personally reviewed following labs and imaging studies  CBC: Recent Labs  Lab 06/03/21 0545 06/09/21 1057  WBC 10.3 8.3  NEUTROABS  --  6.7  HGB 9.4* 9.7*  HCT 29.1* 30.7*  MCV 84.6 86.0  PLT 160 696   Basic Metabolic Panel: Recent Labs  Lab 06/03/21 0545 06/09/21 1057  NA 129* 127*  K 3.9 4.3  CL 93* 96*  CO2 27 24  GLUCOSE 166* 181*  BUN 59* 48*  CREATININE 1.89* 1.65*  CALCIUM 8.6* 7.8*  MG 2.8*  --   PHOS 3.3  --    Liver Function Tests: Recent Labs  Lab 06/09/21 1057  AST 27  ALT 16  ALKPHOS 165*  BILITOT 1.2  PROT 5.7*  ALBUMIN 1.7*   Recent Labs  Lab 06/09/21 1057  LIPASE 69*   Recent Labs  Lab 06/03/21 0545  AMMONIA <9*   CBG: Recent Labs  Lab 06/02/21 1708 06/02/21 2036 06/03/21 0755 06/03/21 1125 06/09/21 0927  GLUCAP 135* 166* 158* 169* 117*    Radiological Exams on Admission: CT ABDOMEN PELVIS W CONTRAST  Result Date: 06/09/2021 CLINICAL DATA:  Abdominal pain EXAM: CT ABDOMEN AND PELVIS WITH CONTRAST TECHNIQUE: Multidetector CT imaging of the abdomen and pelvis was performed using the standard protocol following bolus administration of intravenous contrast. CONTRAST:  78m OMNIPAQUE IOHEXOL 300 MG/ML  SOLN COMPARISON:  CT abdomen and pelvis dated August 05, 2018 FINDINGS: Lower chest: Moderate loculated right pleural effusion with atelectasis. Trace left pleural effusion. Cardiomegaly with coronary artery calcifications. No pericardial effusion. Hepatobiliary: No suspicious focal liver lesions tips shunt is in place and appears patent. Cholelithiasis no evidence of acute cholecystitis. No biliary ductal dilation. Pancreas: Embolization coils noted near the pancreatic head. Pancreas is unremarkable. Spleen: Splenomegaly. Wedge-shaped peripheral area of hypoattenuation seen in the spleen compatible with splenic infarct. Adrenals/Urinary Tract: Bilateral adrenal glands are unremarkable. Bilateral kidneys are  normal in appearance. Normal bladder. Stomach/Bowel: Bowel is normal in caliber with no evidence of obstruction or wall thickening. Normal appendix. Normal appearance of the stomach. Vascular/Lymphatic: Aortic atherosclerotic disease no pathologically enlarged nodes seen in the abdomen or pelvis. IVC filter noted in the infrarenal IVC. Reproductive: Uterus is present. Cystic lesion the left adnexa, measuring up to 4.1 cm, was previously seen on prior CT when it measured to 6.5 cm. Other: No abdominal ascites.  Now free intraperitoneal air. Soft tissue anasarca Musculoskeletal: Grade 1 anterolisthesis of L4 on L5.  No acute osseous abnormalities. IMPRESSION: New splenic infarct. Patent TIPS. Partially visualized moderate loculated right pleural effusion. Electronically Signed   By: Yetta Glassman MD   On: 06/09/2021 14:39    EKG: None  Assessment/Plan Principal Problem:   Splenic infarct Active Problems:   Hx of esophageal varices   Other cirrhosis of liver (HCC)   Type 2 diabetes mellitus with stage 3 chronic kidney disease (HCC)   GAVE (gastric antral vascular ectasia)   Type 2 diabetes mellitus without complication, without long-term current use of insulin (HCC)   Hepatic encephalopathy (HCC)   Chronic diastolic CHF (congestive heart failure) (HCC)   Fracture of femur, distal, right, closed ([place])   Splenic infarct-symptomatic with abdominal pain.  CT shows new wedge-shaped peripheral area of hypoattenuation seen in the spleen compatible with splenic infarct.  Also recent history of bilateral DVTs.  The setting of liver cirrhosis, portal hypertension, ? hypercoagulable state, Antiphoslipid Ab syndrome, Malignancy. - Per Dr Bunnie Domino note- The filter was then deployed into the inferior vena cava at the level of L2 just below the renal veins. -Check for antiphospholipid syndrome -Obtain limited echocardiogram -GI consult in the morning -Will send a secure chat message to general surgeon Dr.  Huntley Estelle to do for now but pain control. - Hold off on anticoagulation at this time pending GI evaluation, also recent history of GI blood loss on Eliquis.  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  Repeat Ultrasound 05/19/2021 demonstrated subacute DVT, and acute occlusive superficial thrombophlebitis of the right small saphenous vein. - Patient was started on anticoagulation with Eliquis which she took for just a week before she presented to the ED 04/2021 with evidence of GI blood loss.  Anticoagulation was discontinued.  An IVC filter was placed by vascular surgeon Dr. Lucky Cowboy 6/16.  History of GI bleed-hemoglobin today stable labs 9.7.  -CBC a.m. -Continue oral Protonix 40 twice daily  Closed fracture of the right lower extremity-managed nonoperatively due to high perioperative risk. -Continue nonweightbearing, brace to right knee  Controlled Diabetes mellitus-diet controlled. Hgba1c 5.6 - Daily fasting CBGs  NASH Liver cirrhosis/esophageal varices/hepatic encephalopathy-stable.  Reports compliance with lactulose -Resume home lactulose  Diastolic CHF-stable.  Last echo 12/2020 EF 55 to 60%, G1DD. -Resume Lasix 40 mg daily, and 5 mg daily metolazone  DVT prophylaxis: Heparin Code Status: Full code Family Communication: None at bedside Disposition Plan: ~ 2 days, patient is from [place] and does not want to be discharged back there.. Consults called: GI Admission status: Obs, tele I certify that at the point of admission it is my clinical judgment that the patient will require inpatient hospital care spanning beyond 2 midnights from the point of admission due to high intensity of service, high risk for further deterioration and high frequency of surveillance required.    Bethena Roys MD [facility]  06/09/2021, 7:18 PM

## 2021-06-09 NOTE — ED Provider Notes (Signed)
[facility] EMERGENCY DEPARTMENT Provider Note   CSN: [id number] Arrival date & time: 06/09/21  0907     History Chief Complaint  Patient presents with   Abdominal Pain    Crystal [name] is a 70 y.o. female.  Patient complains of abdominal pain.  She has a history of cirrhosis and has had a TIPS procedure  The history is provided by the patient and medical records. No language interpreter was used.  Abdominal Pain Pain location:  Generalized Pain quality: aching   Pain radiates to:  Does not radiate Pain severity:  Moderate Onset quality:  Sudden Timing:  Constant Progression:  Waxing and waning Chronicity:  New Context: not alcohol use   Relieved by:  Nothing Worsened by:  Nothing Associated symptoms: no chest pain, no cough, no diarrhea, no fatigue and no hematuria       Past Medical History:  Diagnosis Date   Acute GI bleeding 02/09/2018   Acute upper gastrointestinal bleeding 08/03/2018   Anemia    TAKES IRON TAB   Arthritis    SHOULDER   B12 deficiency 02/18/2018   Bleeding    GI  3/19   Bronchitis    HX OF   CHF (congestive heart failure) (HCC)    Cirrhosis ([place])    Colon cancer screening    Diabetes mellitus without complication ([place])    TYPE 2   Fatty (change of) liver, not elsewhere classified    Full dentures    UPPER AND LOWER   Iron deficiency anemia due to chronic blood loss 02/18/2018   Lung nodule, multiple    Pernicious anemia 02/22/2018   PUD (peptic ulcer disease)    Raynaud's syndrome    Upper GI bleeding 08/03/2018    Patient Active Problem List   Diagnosis Date Noted   Pressure injury of skin 05/30/2021   Closed fracture of distal end of right femur ([place]) 05/23/2021   Fracture of femur, distal, right, closed ([place]) 05/23/2021   Accidental fall 05/23/2021   Angiectasia 05/11/2021   GI bleeding 04/27/2021   Diabetes mellitus, type 2 ([place]) 04/27/2021   CAD (coronary artery disease) 04/27/2021   Chronic kidney disease, stage IV (severe)  ([place]) 04/27/2021   DVT (deep venous thrombosis) ([place]) 04/27/2021   Acute blood loss anemia 04/27/2021   Acute deep vein thrombosis (DVT) of distal vein of lower extremity ([place]) 04/08/2021   Liver cirrhosis secondary to NASH ([place]) 04/08/2021   Thrombocytopenia ([place]) 04/08/2021   Chronic diastolic CHF (congestive heart failure) ([place]) 04/08/2021   Fluid retention 02/21/2021   Edema 12/09/2020   Acute on chronic diastolic heart failure ([place]) 12/09/2020   Pulmonary nodules 05/02/2020   Hepatic encephalopathy ([place]) 04/23/2020   Left ovarian cyst 10/06/2019   Hypokalemia 08/21/2019   AMS (altered mental status) 08/20/2019   Acute osteomyelitis of phalanx of hand ([place]) 06/12/2019   Pain in finger of right hand 06/04/2019   Type 2 diabetes mellitus without complication, without long-term current use of insulin ([place]) 04/02/2019   Aortic atherosclerosis ([place]) 04/02/2019   Coronary artery disease involving native coronary artery of native heart without angina pectoris 04/02/2019   Acute on chronic heart failure with preserved ejection fraction (HFpEF) ([place]) 04/02/2019   Lung nodules    Volume overload 12/11/2018   GAVE (gastric antral vascular ectasia)    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   Type 2 diabetes mellitus with stage 3 chronic kidney disease ([place]) 08/03/2018   Hx of esophageal varices  Other cirrhosis of liver ([place])    Angiodysplasia of small intestine ([place])    Adnexal cyst 03/16/2018   Goals of care, counseling/discussion 03/16/2018   Right middle lobe pulmonary nodule 03/14/2018   Right lower lobe pulmonary nodule 02/18/2018   Iron deficiency anemia due to chronic blood loss 02/18/2018   B12 deficiency 02/18/2018   Anticentromere antibodies present 02/18/2018   Hepatitis C virus infection without hepatic coma 02/18/2018    Past Surgical History:  Procedure Laterality Date   CATARACT EXTRACTION W/PHACO Right 02/06/2018   Procedure: CATARACT  EXTRACTION PHACO AND INTRAOCULAR LENS PLACEMENT ([place]) RIGHT DIABETIC;  Surgeon: Leandrew Koyanagi, MD;  Location: [place];  Service: Ophthalmology;  Laterality: Right;  DIABETIC, ORAL MED   CATARACT EXTRACTION W/PHACO Left 04/30/2018   Procedure: CATARACT EXTRACTION PHACO AND INTRAOCULAR LENS PLACEMENT (IOC);  Surgeon: Leandrew Koyanagi, MD;  Location: [facility];  Service: Ophthalmology;  Laterality: Left;  [place] 01:04 AP% 21.3 CDE 13.66 Fluid pack lot # [id number]   COLONOSCOPY WITH PROPOFOL N/A 03/25/2018   Procedure: COLONOSCOPY WITH PROPOFOL;  Surgeon: Lin Landsman, MD;  Location: [facility] ENDOSCOPY;  Service: Gastroenterology;  Laterality: N/A;   DENTAL SURGERY     EXTRACTIONS   ELECTROMAGNETIC NAVIGATION BROCHOSCOPY Right 01/27/2019   Procedure: ELECTROMAGNETIC NAVIGATION BRONCHOSCOPY;  Surgeon: [name], MD;  Location: [facility];  Service: Cardiopulmonary;  Laterality: Right;   ENTEROSCOPY N/A 07/28/2020   Procedure: Push ENTEROSCOPY;  Surgeon: Lin Landsman, MD;  Location: [facility] ENDOSCOPY;  Service: Gastroenterology;  Laterality: N/A;   ESOPHAGOGASTRODUODENOSCOPY N/A 08/03/2018   Procedure: ESOPHAGOGASTRODUODENOSCOPY (EGD);  Surgeon: Lin Landsman, MD;  Location: [facility] ENDOSCOPY;  Service: Gastroenterology;  Laterality: N/A;   ESOPHAGOGASTRODUODENOSCOPY (EGD) WITH PROPOFOL N/A 02/10/2018   Procedure: ESOPHAGOGASTRODUODENOSCOPY (EGD) WITH PROPOFOL;  Surgeon: Jonathon Bellows, MD;  Location: [facility] ENDOSCOPY;  Service: Gastroenterology;  Laterality: N/A;   ESOPHAGOGASTRODUODENOSCOPY (EGD) WITH PROPOFOL N/A 03/25/2018   Procedure: ESOPHAGOGASTRODUODENOSCOPY (EGD) WITH PROPOFOL;  Surgeon: Lin Landsman, MD;  Location: [facility] ENDOSCOPY;  Service: Gastroenterology;  Laterality: N/A;   ESOPHAGOGASTRODUODENOSCOPY (EGD) WITH PROPOFOL N/A 04/22/2018   Procedure: ESOPHAGOGASTRODUODENOSCOPY (EGD) WITH PROPOFOL with band ligation;  Surgeon: Lin Landsman, MD;  Location: [place];  Service: Gastroenterology;  Laterality: N/A;   ESOPHAGOGASTRODUODENOSCOPY (EGD) WITH PROPOFOL N/A 06/24/2018   Procedure: ESOPHAGOGASTRODUODENOSCOPY (EGD) WITH PROPOFOL;  Surgeon: Lin Landsman, MD;  Location: [facility] ENDOSCOPY;  Service: Gastroenterology;  Laterality: N/A;   ESOPHAGOGASTRODUODENOSCOPY (EGD) WITH PROPOFOL N/A 11/19/2018   Procedure: ESOPHAGOGASTRODUODENOSCOPY (EGD) WITH PROPOFOL;  Surgeon: Lin Landsman, MD;  Location: [place];  Service: Gastroenterology;  Laterality: N/A;   ESOPHAGOGASTRODUODENOSCOPY (EGD) WITH PROPOFOL N/A 03/08/2020   Procedure: ESOPHAGOGASTRODUODENOSCOPY (EGD) WITH PROPOFOL;  Surgeon: Lin Landsman, MD;  Location: [facility] ENDOSCOPY;  Service: Gastroenterology;  Laterality: N/A;   ESOPHAGOGASTRODUODENOSCOPY (EGD) WITH PROPOFOL N/A 04/28/2021   Procedure: ESOPHAGOGASTRODUODENOSCOPY (EGD) WITH PROPOFOL;  Surgeon: Lin Landsman, MD;  Location: [facility] ENDOSCOPY;  Service: Gastroenterology;  Laterality: N/A;   EYE SURGERY     GIVENS CAPSULE STUDY N/A 03/29/2020   Procedure: GIVENS CAPSULE STUDY;  Surgeon: Lin Landsman, MD;  Location: [facility] ENDOSCOPY;  Service: Gastroenterology;  Laterality: N/A;   HEMORRHOID BANDING  03/25/2018   Procedure: HEMORRHOID BANDING;  Surgeon: Lin Landsman, MD;  Location: [facility] ENDOSCOPY;  Service: Gastroenterology;;   IR EMBO ART  VEN HEMORR LYMPH EXTRAV  INC GUIDE ROADMAPPING  08/07/2018   IR RADIOLOGIST EVAL & MGMT  09/03/2018   IR RADIOLOGIST EVAL & MGMT  12/17/2018   IR RADIOLOGIST EVAL & MGMT  06/24/2019   IR RADIOLOGIST EVAL & MGMT  01/22/2020   IR RADIOLOGIST EVAL & MGMT  02/09/2021   IR TIPS  08/07/2018   IR TRANSHEPATIC PORTOGRAM W HEMO  03/07/2021   IR US GUIDE VASC ACCESS RIGHT  03/07/2021   IR VENO/JUGULAR LEFT  03/07/2021   IVC FILTER INSERTION N/A 04/28/2021   Procedure: IVC FILTER INSERTION;  Surgeon: Algernon Huxley, MD;  Location: [place] CV LAB;  Service: Cardiovascular;  Laterality:  N/A;   PORTA CATH INSERTION N/A 07/30/2020   Procedure: PORTA CATH INSERTION;  Surgeon: Katha Cabal, MD;  Location: [place] CV LAB;  Service: Cardiovascular;  Laterality: N/A;   RADIOLOGY WITH ANESTHESIA N/A 08/07/2018   Procedure: TIPS;  Surgeon: Corrie Mckusick, DO;  Location: [place];  Service: Anesthesiology;  Laterality: N/A;   RIGHT HEART CATH N/A 05/20/2021   Procedure: RIGHT HEART CATH;  Surgeon: Nelva Bush, MD;  Location: [place] CV LAB;  Service: Cardiovascular;  Laterality: N/A;   TONSILLECTOMY       OB History   No obstetric history on file.     Family History  Problem Relation Age of Onset   CAD Father    Heart attack Father 46   Cancer Maternal Uncle    Seizures Mother     Social History   Tobacco Use   Smoking status: Never   Smokeless tobacco: Never  Vaping Use   Vaping Use: Never used  Substance Use Topics   Alcohol use: Not Currently    Comment: No EtOH for 30 years.  Never a heavy drinker.   Drug use: Never    Home Medications Prior to Admission medications   Medication Sig Start Date End Date Taking? Authorizing Provider  acetaminophen (TYLENOL) 500 MG tablet Take 1 tablet (500 mg total) by mouth every 6 (six) hours as needed for mild pain, moderate pain, fever or headache. 06/02/21  Yes Val Riles, MD  amoxicillin-clavulanate (AUGMENTIN) 875-125 MG tablet Take 1 tablet by mouth 2 (two) times daily. For 5 days 06/03/21  Yes [provider]  furosemide (LASIX) 80 MG tablet Take 0.5 tablets (40 mg total) by mouth daily. 06/02/21  Yes Val Riles, MD  gabapentin (NEURONTIN) 100 MG capsule Take 100 mg by mouth daily. For 3 days   Yes [provider]  hydrOXYzine (ATARAX/VISTARIL) 25 MG tablet Take 1 tablet (25 mg total) by mouth 2 (two) times daily as needed. Patient taking differently: Take 25 mg by mouth 2 (two) times daily as needed for itching. 04/08/21  Yes Kathie Dike, MD  lactulose (CHRONULAC) 10 GM/15ML  solution Take 30 mLs (20 g total) by mouth 3 (three) times daily. 04/08/21  Yes Kathie Dike, MD  metolazone (ZAROXOLYN) 5 MG tablet Take 1 tablet (5 mg total) by mouth every other day. Patient taking differently: Take 5 mg by mouth daily. 05/20/21 09/17/21 Yes End, Harrell Gave, MD  ondansetron (ZOFRAN) 4 MG tablet Take 4 mg by mouth every 8 (eight) hours as needed for nausea or vomiting.   Yes [provider]  pantoprazole (PROTONIX) 40 MG tablet Take 1 tablet (40 mg total) by mouth 2 (two) times daily. 05/01/21 06/09/21 Yes Sreenath, Sudheer B, MD  potassium chloride SA (KLOR-CON) 20 MEQ tablet Take 1 tablet (20 mEq total) by mouth daily. 06/02/21 09/30/21 Yes Val Riles, MD  spironolactone (ALDACTONE) 50 MG tablet Take 1 tablet (50 mg total) by mouth daily. 06/02/21  Yes  Val Riles, MD  Vitamin D, Ergocalciferol, (DRISDOL) 1.25 MG (50000 UNIT) CAPS capsule Take 50,000 Units by mouth every Sunday. 01/14/21  Yes [provider]  benzonatate (TESSALON) 100 MG capsule Take 1 capsule (100 mg total) by mouth 4 (four) times daily as needed. Patient not taking: No sig reported 05/01/21   Ralene Muskrat B, MD  oxyCODONE (OXY IR/ROXICODONE) 5 MG immediate release tablet Take 1 tablet (5 mg total) by mouth every 6 (six) hours as needed for up to 7 days for moderate pain or severe pain. Patient not taking: No sig reported 06/02/21 06/09/21  Val Riles, MD    Allergies    Patient has no known allergies.  Review of Systems   Review of Systems  Constitutional:  Negative for appetite change and fatigue.  HENT:  Negative for congestion, ear discharge and sinus pressure.   Eyes:  Negative for discharge.  Respiratory:  Negative for cough.   Cardiovascular:  Negative for chest pain.  Gastrointestinal:  Positive for abdominal pain. Negative for diarrhea.  Genitourinary:  Negative for frequency and hematuria.  Musculoskeletal:  Negative for back pain.  Skin:  Negative for rash.   Neurological:  Negative for seizures and headaches.  Psychiatric/Behavioral:  Negative for hallucinations.    Physical Exam Updated Vital Signs BP 122/60   Pulse 85   Temp 98.8 F (37.1 C) (Oral)   Resp 19   Ht 5' 2" (1.575 m)   Wt 86.2 kg   LMP  (LMP Unknown)   SpO2 100%   BMI 34.75 kg/m   Physical Exam Vitals and nursing note reviewed.  Constitutional:      Appearance: She is well-developed.  HENT:     Head: Normocephalic.     Nose: Nose normal.  Eyes:     General: No scleral icterus.    Conjunctiva/sclera: Conjunctivae normal.  Neck:     Thyroid: No thyromegaly.  Cardiovascular:     Rate and Rhythm: Normal rate and regular rhythm.     Heart sounds: No murmur heard.   No friction rub. No gallop.  Pulmonary:     Breath sounds: No stridor. No wheezing or rales.  Chest:     Chest wall: No tenderness.  Abdominal:     General: There is no distension.     Tenderness: There is abdominal tenderness. There is no rebound.  Musculoskeletal:        General: Normal range of motion.     Cervical back: Neck supple.  Lymphadenopathy:     Cervical: No cervical adenopathy.  Skin:    Findings: No erythema or rash.  Neurological:     Mental Status: She is alert and oriented to person, place, and time.     Motor: No abnormal muscle tone.     Coordination: Coordination normal.  Psychiatric:        Behavior: Behavior normal.    ED Results / Procedures / Treatments   Labs (all labs ordered are listed, but only abnormal results are displayed) Labs Reviewed  CBC WITH DIFFERENTIAL/PLATELET - Abnormal; Notable for the following components:      Result Value   RBC 3.57 (*)    Hemoglobin 9.7 (*)    HCT 30.7 (*)    RDW 18.9 (*)    Lymphs Abs 0.6 (*)    All other components within normal limits  COMPREHENSIVE METABOLIC PANEL - Abnormal; Notable for the following components:   Sodium 127 (*)    Chloride 96 (*)  Glucose, Bld 181 (*)    BUN 48 (*)    Creatinine, Ser 1.65  (*)    Calcium 7.8 (*)    Total Protein 5.7 (*)    Albumin 1.7 (*)    Alkaline Phosphatase 165 (*)    GFR, Estimated 33 (*)    All other components within normal limits  LIPASE, BLOOD - Abnormal; Notable for the following components:   Lipase 69 (*)    All other components within normal limits  CBG MONITORING, ED - Abnormal; Notable for the following components:   Glucose-Capillary 117 (*)    All other components within normal limits    EKG None  Radiology CT ABDOMEN PELVIS W CONTRAST  Result Date: 06/09/2021 CLINICAL DATA:  Abdominal pain EXAM: CT ABDOMEN AND PELVIS WITH CONTRAST TECHNIQUE: Multidetector CT imaging of the abdomen and pelvis was performed using the standard protocol following bolus administration of intravenous contrast. CONTRAST:  70m OMNIPAQUE IOHEXOL 300 MG/ML  SOLN COMPARISON:  CT abdomen and pelvis dated August 05, 2018 FINDINGS: Lower chest: Moderate loculated right pleural effusion with atelectasis. Trace left pleural effusion. Cardiomegaly with coronary artery calcifications. No pericardial effusion. Hepatobiliary: No suspicious focal liver lesions tips shunt is in place and appears patent. Cholelithiasis no evidence of acute cholecystitis. No biliary ductal dilation. Pancreas: Embolization coils noted near the pancreatic head. Pancreas is unremarkable. Spleen: Splenomegaly. Wedge-shaped peripheral area of hypoattenuation seen in the spleen compatible with splenic infarct. Adrenals/Urinary Tract: Bilateral adrenal glands are unremarkable. Bilateral kidneys are normal in appearance. Normal bladder. Stomach/Bowel: Bowel is normal in caliber with no evidence of obstruction or wall thickening. Normal appendix. Normal appearance of the stomach. Vascular/Lymphatic: Aortic atherosclerotic disease no pathologically enlarged nodes seen in the abdomen or pelvis. IVC filter noted in the infrarenal IVC. Reproductive: Uterus is present. Cystic lesion the left adnexa, measuring up  to 4.1 cm, was previously seen on prior CT when it measured to 6.5 cm. Other: No abdominal ascites.  Now free intraperitoneal air. Soft tissue anasarca Musculoskeletal: Grade 1 anterolisthesis of L4 on L5. No acute osseous abnormalities. IMPRESSION: New splenic infarct. Patent TIPS. Partially visualized moderate loculated right pleural effusion. Electronically Signed   By: LYetta GlassmanMD   On: 06/09/2021 14:39    Procedures Procedures   Medications Ordered in ED Medications  sodium chloride 0.9 % bolus 500 mL (0 mLs Intravenous Stopped 06/09/21 1244)  ondansetron (ZOFRAN) injection 4 mg (4 mg Intravenous Given 06/09/21 1039)  HYDROmorphone (DILAUDID) injection 0.5 mg (0.5 mg Intravenous Given 06/09/21 1038)  sodium chloride 0.9 % bolus 1,000 mL (1,000 mLs Intravenous New Bag/Given 06/09/21 1328)  iohexol (OMNIPAQUE) 300 MG/ML solution 75 mL (75 mLs Intravenous Contrast Given 06/09/21 1359)    ED Course  I have reviewed the triage vital signs and the nursing notes.  Pertinent labs & imaging results that were available during my care of the patient were reviewed by me and considered in my medical decision making (see chart for details).    MDM Rules/Calculators/A&P                           Patient with splenic infarct.  I spoke to GI and they will follow the patient if the hospitalist consults then.  She will be admitted to medicine. Final Clinical Impression(s) / ED Diagnoses Final diagnoses:  Splenic infarct    Rx / DC Orders ED Discharge Orders     None  Milton Ferguson, MD 06/13/21 [phone]

## 2021-06-10 ENCOUNTER — Observation Stay (HOSPITAL_COMMUNITY): Payer: Medicare HMO

## 2021-06-10 DIAGNOSIS — E1122 Type 2 diabetes mellitus with diabetic chronic kidney disease: Secondary | ICD-10-CM | POA: Diagnosis not present

## 2021-06-10 DIAGNOSIS — I7300 Raynaud's syndrome without gangrene: Secondary | ICD-10-CM | POA: Diagnosis present

## 2021-06-10 DIAGNOSIS — E119 Type 2 diabetes mellitus without complications: Secondary | ICD-10-CM | POA: Diagnosis not present

## 2021-06-10 DIAGNOSIS — I8001 Phlebitis and thrombophlebitis of superficial vessels of right lower extremity: Secondary | ICD-10-CM | POA: Diagnosis present

## 2021-06-10 DIAGNOSIS — I82502 Chronic embolism and thrombosis of unspecified deep veins of left lower extremity: Secondary | ICD-10-CM | POA: Diagnosis present

## 2021-06-10 DIAGNOSIS — N184 Chronic kidney disease, stage 4 (severe): Secondary | ICD-10-CM

## 2021-06-10 DIAGNOSIS — I5032 Chronic diastolic (congestive) heart failure: Secondary | ICD-10-CM

## 2021-06-10 DIAGNOSIS — M19019 Primary osteoarthritis, unspecified shoulder: Secondary | ICD-10-CM | POA: Diagnosis present

## 2021-06-10 DIAGNOSIS — K31819 Angiodysplasia of stomach and duodenum without bleeding: Secondary | ICD-10-CM

## 2021-06-10 DIAGNOSIS — K7290 Hepatic failure, unspecified without coma: Secondary | ICD-10-CM | POA: Diagnosis not present

## 2021-06-10 DIAGNOSIS — Z79899 Other long term (current) drug therapy: Secondary | ICD-10-CM | POA: Diagnosis not present

## 2021-06-10 DIAGNOSIS — N1831 Chronic kidney disease, stage 3a: Secondary | ICD-10-CM | POA: Diagnosis not present

## 2021-06-10 DIAGNOSIS — K7469 Other cirrhosis of liver: Secondary | ICD-10-CM

## 2021-06-10 DIAGNOSIS — R0600 Dyspnea, unspecified: Secondary | ICD-10-CM | POA: Diagnosis not present

## 2021-06-10 DIAGNOSIS — K7581 Nonalcoholic steatohepatitis (NASH): Secondary | ICD-10-CM | POA: Diagnosis present

## 2021-06-10 DIAGNOSIS — I8510 Secondary esophageal varices without bleeding: Secondary | ICD-10-CM | POA: Diagnosis not present

## 2021-06-10 DIAGNOSIS — Z20822 Contact with and (suspected) exposure to covid-19: Secondary | ICD-10-CM | POA: Diagnosis not present

## 2021-06-10 DIAGNOSIS — J90 Pleural effusion, not elsewhere classified: Secondary | ICD-10-CM | POA: Diagnosis not present

## 2021-06-10 DIAGNOSIS — W19XXXD Unspecified fall, subsequent encounter: Secondary | ICD-10-CM | POA: Diagnosis not present

## 2021-06-10 DIAGNOSIS — K766 Portal hypertension: Secondary | ICD-10-CM | POA: Diagnosis present

## 2021-06-10 DIAGNOSIS — I2510 Atherosclerotic heart disease of native coronary artery without angina pectoris: Secondary | ICD-10-CM | POA: Diagnosis present

## 2021-06-10 DIAGNOSIS — J9811 Atelectasis: Secondary | ICD-10-CM | POA: Diagnosis not present

## 2021-06-10 DIAGNOSIS — D735 Infarction of spleen: Principal | ICD-10-CM

## 2021-06-10 DIAGNOSIS — D6859 Other primary thrombophilia: Secondary | ICD-10-CM | POA: Diagnosis not present

## 2021-06-10 DIAGNOSIS — I82431 Acute embolism and thrombosis of right popliteal vein: Secondary | ICD-10-CM | POA: Diagnosis not present

## 2021-06-10 DIAGNOSIS — E871 Hypo-osmolality and hyponatremia: Secondary | ICD-10-CM | POA: Diagnosis not present

## 2021-06-10 DIAGNOSIS — D500 Iron deficiency anemia secondary to blood loss (chronic): Secondary | ICD-10-CM | POA: Diagnosis present

## 2021-06-10 DIAGNOSIS — Z95828 Presence of other vascular implants and grafts: Secondary | ICD-10-CM | POA: Diagnosis not present

## 2021-06-10 DIAGNOSIS — Z6835 Body mass index (BMI) 35.0-35.9, adult: Secondary | ICD-10-CM | POA: Diagnosis not present

## 2021-06-10 LAB — CBC
HCT: 31.7 % — ABNORMAL LOW (ref 36.0–46.0)
Hemoglobin: 9.8 g/dL — ABNORMAL LOW (ref 12.0–15.0)
MCH: 26.6 pg (ref 26.0–34.0)
MCHC: 30.9 g/dL (ref 30.0–36.0)
MCV: 85.9 fL (ref 80.0–100.0)
Platelets: 216 10*3/uL (ref 150–400)
RBC: 3.69 MIL/uL — ABNORMAL LOW (ref 3.87–5.11)
RDW: 18.6 % — ABNORMAL HIGH (ref 11.5–15.5)
WBC: 7.7 10*3/uL (ref 4.0–10.5)
nRBC: 0 % (ref 0.0–0.2)

## 2021-06-10 LAB — HEMOGLOBIN A1C
Hgb A1c MFr Bld: 5.7 % — ABNORMAL HIGH (ref 4.8–5.6)
Mean Plasma Glucose: 116.89 mg/dL

## 2021-06-10 LAB — ECHOCARDIOGRAM LIMITED
Height: 62 in
S' Lateral: 2.44 cm
Weight: 3072.33 oz

## 2021-06-10 LAB — BASIC METABOLIC PANEL
Anion gap: 7 (ref 5–15)
BUN: 42 mg/dL — ABNORMAL HIGH (ref 8–23)
CO2: 23 mmol/L (ref 22–32)
Calcium: 7.9 mg/dL — ABNORMAL LOW (ref 8.9–10.3)
Chloride: 99 mmol/L (ref 98–111)
Creatinine, Ser: 1.53 mg/dL — ABNORMAL HIGH (ref 0.44–1.00)
GFR, Estimated: 37 mL/min — ABNORMAL LOW (ref >60)
Glucose, Bld: 138 mg/dL — ABNORMAL HIGH (ref 70–99)
Potassium: 4.3 mmol/L (ref 3.5–5.1)
Sodium: 129 mmol/L — ABNORMAL LOW (ref 135–145)

## 2021-06-10 LAB — PROTIME-INR
INR: 1.3 — ABNORMAL HIGH (ref 0.8–1.2)
Prothrombin Time: 15.9 seconds — ABNORMAL HIGH (ref 11.4–15.2)

## 2021-06-10 LAB — TROPONIN I (HIGH SENSITIVITY)
Troponin I (High Sensitivity): 9 ng/L (ref <18)
Troponin I (High Sensitivity): 9 ng/L (ref <18)

## 2021-06-10 MED ORDER — CHLORHEXIDINE GLUCONATE CLOTH 2 % EX PADS
6.0000 | MEDICATED_PAD | Freq: Every day | CUTANEOUS | Status: DC
  Administered 2021-06-10 – 2021-06-12 (×4): 6 via TOPICAL

## 2021-06-10 NOTE — Progress Notes (Signed)
Patient has stage small stage 2 to buttocks on admit to floor.  She as  0.25x.25 stage 2 to each buttock, and to inner left thigh.  Patient has bruising to left great toe.

## 2021-06-10 NOTE — Progress Notes (Signed)
  Echocardiogram 2D Echocardiogram has been performed.  Crystal [name] 06/10/2021, 9:39 AM

## 2021-06-10 NOTE — TOC Initial Note (Signed)
Transition of Care [facility]) - Initial/Assessment Note    Patient Details  Name: Crystal [name] MRN: [id number] Date of Birth: [birth year]  Transition of Care [facility]) CM/SW Contact:    Iona Beard, [place] Phone Number: 06/10/2021, 12:06 PM  Clinical Narrative:                 Pt coming from [place] for SNF. CSW spoke with pt about interest in returning to [place] at D/C. Pt states that she does not want to return to [place] and would like to return home with [name] services at D/C. Pt states that she lives with her husband and her daughter/cousins live near by and they will all assist her if needed. Pt states she has a walker to use when needed. CSW to search for [facility] agency to accept pt. CSW spoke with Judson Roch at [facility] who states that she will accept pts referral for [facility] PT. CSW to ask MD to place orders. TOC to follow.   Expected Discharge Plan: [place] Barriers to Discharge: Continued Medical Work up   Patient Goals and CMS Choice Patient states their goals for this hospitalization and ongoing recovery are:: Home with [facility] CMS Medicare.gov Compare Post Acute Care list provided to:: Patient Choice offered to / list presented to : Patient  Expected Discharge Plan and Services Expected Discharge Plan: [place] In-house Referral: Clinical Social Work Discharge Planning Services: CM Consult Post Acute Care Choice: [place] arrangements for the past 2 months: [place]                                      Prior Living Arrangements/Services Living arrangements for the past 2 months: Single Family Home Lives with:: Spouse Patient language and need for interpreter reviewed:: Yes Do you feel safe going back to the place where you live?: Yes      Need for Family Participation in Patient Care: No (Comment) Care giver support system in place?: Yes (comment) Current home services: DME [facility]) Criminal Activity/Legal Involvement  Pertinent to Current Situation/Hospitalization: No - Comment as needed  Activities of Daily Living Home Assistive Devices/Equipment: Eyeglasses, Environmental consultant (specify type) ADL Screening (condition at time of admission) Patient's cognitive ability adequate to safely complete daily activities?: Yes Is the patient deaf or have difficulty hearing?: No Does the patient have difficulty seeing, even when wearing glasses/contacts?: Yes Does the patient have difficulty concentrating, remembering, or making decisions?: No Patient able to express need for assistance with ADLs?: Yes Does the patient have difficulty dressing or bathing?: Yes Independently performs ADLs?: No Communication: Independent Dressing (OT): Needs assistance Is this a change from baseline?: Pre-admission baseline Grooming: Independent Feeding: Independent Bathing: Needs assistance Is this a change from baseline?: Pre-admission baseline Toileting: Needs assistance Is this a change from baseline?: Pre-admission baseline In/Out Bed: Needs assistance Is this a change from baseline?: Pre-admission baseline Walks in Home: Needs assistance Is this a change from baseline?: Pre-admission baseline Does the patient have difficulty walking or climbing stairs?: Yes Weakness of Legs: Right Weakness of Arms/Hands: None  Permission Sought/Granted                  Emotional Assessment Appearance:: Appears stated age Attitude/Demeanor/Rapport: Engaged Affect (typically observed): Accepting Orientation: : Oriented to Self, Oriented to Place, Oriented to  Time, Oriented to Situation Alcohol / Substance Use: Not Applicable Psych  Involvement: No (comment)  Admission diagnosis:  Splenic infarct [D73.5] Patient Active Problem List   Diagnosis Date Noted   CKD (chronic kidney disease) stage 4, GFR 15-29 ml/min ([place]) 06/10/2021   Splenic infarct 06/09/2021   Pressure injury of skin 05/30/2021   Closed fracture of distal end of right  femur ([place]) 05/23/2021   Fracture of femur, distal, right, closed ([place]) 05/23/2021   Accidental fall 05/23/2021   Angiectasia 05/11/2021   GI bleeding 04/27/2021   Diabetes mellitus, type 2 ([place]) 04/27/2021   CAD (coronary artery disease) 04/27/2021   Chronic kidney disease, stage IV (severe) ([place]) 04/27/2021   DVT (deep venous thrombosis) ([place]) 04/27/2021   Acute blood loss anemia 04/27/2021   Acute deep vein thrombosis (DVT) of distal vein of lower extremity ([place]) 04/08/2021   Liver cirrhosis secondary to NASH ([place]) 04/08/2021   Thrombocytopenia ([place]) 04/08/2021   Chronic diastolic CHF (congestive heart failure) ([place]) 04/08/2021   Fluid retention 02/21/2021   Edema 12/09/2020   Acute on chronic diastolic heart failure ([place]) 12/09/2020   Pulmonary nodules 05/02/2020   Hepatic encephalopathy ([place]) 04/23/2020   Left ovarian cyst 10/06/2019   Hypokalemia 08/21/2019   AMS (altered mental status) 08/20/2019   Acute osteomyelitis of phalanx of hand ([place]) 06/12/2019   Pain in finger of right hand 06/04/2019   Type 2 diabetes mellitus without complication, without long-term current use of insulin ([place]) 04/02/2019   Aortic atherosclerosis ([place]) 04/02/2019   Coronary artery disease involving native coronary artery of native heart without angina pectoris 04/02/2019   Acute on chronic heart failure with preserved ejection fraction (HFpEF) ([place]) 04/02/2019   Lung nodules    Volume overload 12/11/2018   GAVE (gastric antral vascular ectasia)    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   Type 2 diabetes mellitus with stage 3 chronic kidney disease ([place]) 08/03/2018   Hx of esophageal varices    Other cirrhosis of liver ([place])    Angiodysplasia of small intestine ([place])    Adnexal cyst 03/16/2018   Goals of care, counseling/discussion 03/16/2018   Right middle lobe pulmonary nodule 03/14/2018   Right lower lobe pulmonary nodule 02/18/2018   Iron deficiency anemia due  to chronic blood loss 02/18/2018   B12 deficiency 02/18/2018   Anticentromere antibodies present 02/18/2018   Hepatitis C virus infection without hepatic coma 02/18/2018   PCP:  Caprice Renshaw, MD Pharmacy:   [id number] [place] [address]Phone: [phone]: [phone]    Social Determinants of Health (SDOH) Interventions    Readmission Risk Interventions Readmission Risk Prevention Plan 04/28/2021 04/07/2021  Transportation Screening Complete Complete  Medication Review (Press photographer Complete Complete  PCP or Specialist appointment within 3-5 days of discharge - Complete  [place] or [place]  SW Recovery Care/Counseling Consult - Complete  Palliative Care Screening Not Applicable [place] Applic[place] Not Applicable  Some recent data might be hidden

## 2021-06-10 NOTE — Evaluation (Addendum)
Physical Therapy Evaluation Patient Details Name: Crystal [name] MRN: [id number] DOB: [birth year] Today's Date: 06/10/2021   History of Present Illness  Crystal [name] is a 70 y.o. female with medical history significant for NASH liver cirrhosis with esophageal varices, history of encephalopathy and TIPS procedure, diastolic CHF, PUD/upper GI bleed, Raynaud's phenomenon, coronary artery disease, hepatitis C.  Patient presented to the ED with complaints of generalized persistent abdominal pain of 2 days duration.  No vomiting, no loose stools.  Patient reports a history of blood clots in her bilateral lower extremities.  Recent right knee fracture with immobilizer 2 weeks ago, NWB RLE per patient.   Clinical Impression  Patient demonstrates slow labored movement for sitting up at bedside requiring Max/mod assist to move RLE due to weakess and pain.  Patient unable to maintain standing balance or transfer to chair secondary to weakness, poor standing balance and unable to maintain NWB RLE using RW.  Patient put back to bed with Mod/max assist to reposition.  Patient will benefit from continued physical therapy in hospital and recommended venue below to increase strength, balance, endurance for safe ADLs and gait.      Follow Up Recommendations SNF    Equipment Recommendations  Hospital bed;Wheelchair cushion (measurements PT) (hoyar lift)    Recommendations for Other Services       Precautions / Restrictions Precautions Precautions: Fall Required Braces or Orthoses: Knee Immobilizer - Right Restrictions Weight Bearing Restrictions: Yes RLE Weight Bearing: Non weight bearing      Mobility  Bed Mobility Overal bed mobility: Needs Assistance Bed Mobility: Supine to Sit;Sit to Supine     Supine to sit: Mod assist;Max assist Sit to supine: Mod assist;Max assist   General bed mobility comments: slow labored movement, unable to move RLE due weakness, pain    Transfers Overall transfer  level: Needs assistance Equipment used: Rolling walker (2 wheeled) Transfers: Sit to/from Stand Sit to Stand: Max assist         General transfer comment: unable to maintain standing balance due to BUEand LLE weakness  Ambulation/Gait                Stairs            Wheelchair Mobility    Modified Rankin (Stroke Patients Only)       Balance Overall balance assessment: Needs assistance Sitting-balance support: Feet supported;No upper extremity supported Sitting balance-Leahy Scale: Fair Sitting balance - Comments: seated at EOB   Standing balance support: During functional activity;Bilateral upper extremity supported Standing balance-Leahy Scale: Poor Standing balance comment: using RW                             Pertinent Vitals/Pain Pain Assessment: 0-10 Pain Score: 9  Pain Location: RLE and across stomach Pain Descriptors / Indicators: Sore;Discomfort Pain Intervention(s): Limited activity within patient's tolerance;Monitored during session;Repositioned    Home Living Family/patient expects to be discharged to:: Private residence Living Arrangements: Spouse/significant other Available Help at Discharge: Available 24 hours/day Type of Home: House Home Access: Ramped entrance     Home Layout: One level Home Equipment: Environmental consultant - 2 wheels;Walker - 4 wheels;Cane - single point;Bedside commode;Shower seat      Prior Function Level of Independence: Needs assistance   Gait / Transfers Assistance Needed: household and short distanced Electronics engineer  ADL's / Homemaking Assistance Needed: assisted by family        Hand Dominance  Dominant Hand: Right    Extremity/Trunk Assessment   Upper Extremity Assessment Upper Extremity Assessment: Generalized weakness    Lower Extremity Assessment Lower Extremity Assessment: Generalized weakness;RLE deficits/detail;LLE deficits/detail RLE Deficits / Details: grossly 2+/5  except knee not tested RLE: Unable to fully assess due to immobilization RLE Sensation: WNL RLE Coordination: WNL LLE Deficits / Details: grossly 3-/5 LLE Sensation: WNL LLE Coordination: WNL    Cervical / Trunk Assessment Cervical / Trunk Assessment: Normal  Communication   Communication: No difficulties  Cognition Arousal/Alertness: Awake/alert Behavior During Therapy: WFL for tasks assessed/performed Overall Cognitive Status: Within Functional Limits for tasks assessed                                        General Comments      Exercises     Assessment/Plan    PT Assessment Patient needs continued PT services  PT Problem List Decreased strength;Decreased range of motion;Decreased activity tolerance;Decreased balance;Decreased mobility;Decreased knowledge of precautions;Pain;Decreased knowledge of use of DME       PT Treatment Interventions DME instruction;Gait training;Stair training;Functional mobility training;Therapeutic activities;Therapeutic exercise;Balance training;Patient/family education    PT Goals (Current goals can be found in the Care Plan section)  Acute Rehab PT Goals Patient Stated Goal: return home with family to assist PT Goal Formulation: With patient/family Time For Goal Achievement: 06/24/21 Potential to Achieve Goals: Good    Frequency Min 3X/week   Barriers to discharge        Co-evaluation               AM-PAC PT "6 Clicks" Mobility  Outcome Measure Help needed turning from your back to your side while in a flat bed without using bedrails?: A Lot Help needed moving from lying on your back to sitting on the side of a flat bed without using bedrails?: A Lot Help needed moving to and from a bed to a chair (including a wheelchair)?: Total Help needed standing up from a chair using your arms (e.g., wheelchair or bedside chair)?: Total Help needed to walk in hospital room?: Total Help needed climbing 3-5 steps with a  railing? : Total 6 Click Score: 8    End of Session   Activity Tolerance: Patient tolerated treatment well;Patient limited by fatigue Patient left: in bed;with call bell/phone within reach;with family/visitor present Nurse Communication: Mobility status PT Visit Diagnosis: Unsteadiness on feet (R26.81);Other abnormalities of gait and mobility (R26.89);Pain;Difficulty in walking, not elsewhere classified (R26.2);Muscle weakness (generalized) (M62.81) Pain - Right/Left: Right Pain - part of body: Knee;Leg    Time: 1523-1550 PT Time Calculation (min) (ACUTE ONLY): 27 min   Charges:   PT Evaluation $PT Eval Moderate Complexity: 1 Mod PT Treatments $Therapeutic Activity: 23-37 mins        4:21 PM, 06/10/21 Lonell Grandchild, MPT Physical Therapist with [facility] [phone] office [phone] mobile phone

## 2021-06-10 NOTE — Consult Note (Signed)
[facility] Consult  Reason for Consult:Splenic Infarct  Referring Physician: Dr. Carles Collet   Chief Complaint   Abdominal Pain     HPI: Crystal [name] is a 70 y.o. 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 She comes in with 2 days of abdominal pain. The pain is on the left side and was not associated with any vomiting. She does have some nausea at times. She was evaluated in the ED and found to have a small splenic infarct.    Past Medical History:  Diagnosis Date   Acute GI bleeding 02/09/2018   Acute upper gastrointestinal bleeding 08/03/2018   Anemia    TAKES IRON TAB   Arthritis    SHOULDER   B12 deficiency 02/18/2018   Bleeding    GI  3/19   Bronchitis    HX OF   CHF (congestive heart failure) (HCC)    Cirrhosis (HCC)    Colon cancer screening    Diabetes mellitus without complication ([place])    TYPE 2   Fatty (change of) liver, not elsewhere classified    Full dentures    UPPER AND LOWER   Iron deficiency anemia due to chronic blood loss 02/18/2018   Lung nodule, multiple    Pernicious anemia 02/22/2018   PUD (peptic ulcer disease)    Raynaud's syndrome    Upper GI bleeding 08/03/2018    Past Surgical History:  Procedure Laterality Date   CATARACT EXTRACTION W/PHACO Right 02/06/2018   Procedure: CATARACT EXTRACTION PHACO AND INTRAOCULAR LENS PLACEMENT ([place]) RIGHT DIABETIC;  Surgeon: Leandrew Koyanagi, MD;  Location: [place];  Service: Ophthalmology;  Laterality: Right;  DIABETIC, ORAL MED   CATARACT EXTRACTION W/PHACO Left 04/30/2018   Procedure: CATARACT EXTRACTION PHACO AND INTRAOCULAR LENS PLACEMENT (IOC);  Surgeon: Leandrew Koyanagi, MD;  Location: [facility];  Service: Ophthalmology;  Laterality: Left;  [place] 01:04 AP% 21.3 CDE  13.66 Fluid pack lot # [id number]   COLONOSCOPY WITH PROPOFOL N/A 03/25/2018   Procedure: COLONOSCOPY WITH PROPOFOL;  Surgeon: Lin Landsman, MD;  Location: [facility] ENDOSCOPY;  Service: Gastroenterology;  Laterality: N/A;   DENTAL SURGERY     EXTRACTIONS   ELECTROMAGNETIC NAVIGATION BROCHOSCOPY Right 01/27/2019   Procedure: ELECTROMAGNETIC NAVIGATION BRONCHOSCOPY;  Surgeon: [name], MD;  Location: [facility];  Service: Cardiopulmonary;  Laterality: Right;   ENTEROSCOPY N/A 07/28/2020   Procedure: Push ENTEROSCOPY;  Surgeon: Lin Landsman, MD;  Location: [facility] ENDOSCOPY;  Service: Gastroenterology;  Laterality: N/A;   ESOPHAGOGASTRODUODENOSCOPY N/A 08/03/2018   Procedure: ESOPHAGOGASTRODUODENOSCOPY (EGD);  Surgeon: Lin Landsman, MD;  Location: [facility] ENDOSCOPY;  Service: Gastroenterology;  Laterality: N/A;   ESOPHAGOGASTRODUODENOSCOPY (EGD) WITH PROPOFOL N/A 02/10/2018   Procedure: ESOPHAGOGASTRODUODENOSCOPY (EGD) WITH PROPOFOL;  Surgeon: Jonathon Bellows, MD;  Location: [facility] ENDOSCOPY;  Service: Gastroenterology;  Laterality: N/A;   ESOPHAGOGASTRODUODENOSCOPY (EGD) WITH PROPOFOL N/A 03/25/2018   Procedure: ESOPHAGOGASTRODUODENOSCOPY (EGD) WITH PROPOFOL;  Surgeon: Lin Landsman, MD;  Location: [facility] ENDOSCOPY;  Service: Gastroenterology;  Laterality: N/A;   ESOPHAGOGASTRODUODENOSCOPY (EGD) WITH PROPOFOL N/A 04/22/2018   Procedure: ESOPHAGOGASTRODUODENOSCOPY (EGD) WITH PROPOFOL with band ligation;  Surgeon: Lin Landsman, MD;  Location: [place];  Service: Gastroenterology;  Laterality: N/A;   ESOPHAGOGASTRODUODENOSCOPY (EGD) WITH PROPOFOL N/A 06/24/2018   Procedure: ESOPHAGOGASTRODUODENOSCOPY (EGD) WITH PROPOFOL;  Surgeon: Lin Landsman, MD;  Location: [facility] ENDOSCOPY;  Service: Gastroenterology;  Laterality: N/A;   ESOPHAGOGASTRODUODENOSCOPY (EGD) WITH PROPOFOL N/A 11/19/2018   Procedure: ESOPHAGOGASTRODUODENOSCOPY (EGD) WITH PROPOFOL;  Surgeon: Lin Landsman, MD;   Location: [place];  Service: Gastroenterology;  Laterality: N/A;   ESOPHAGOGASTRODUODENOSCOPY (EGD) WITH PROPOFOL N/A 03/08/2020   Procedure: ESOPHAGOGASTRODUODENOSCOPY (EGD) WITH PROPOFOL;  Surgeon: Lin Landsman, MD;  Location: [facility] ENDOSCOPY;  Service: Gastroenterology;  Laterality: N/A;   ESOPHAGOGASTRODUODENOSCOPY (EGD) WITH PROPOFOL N/A 04/28/2021   Procedure: ESOPHAGOGASTRODUODENOSCOPY (EGD) WITH PROPOFOL;  Surgeon: Lin Landsman, MD;  Location: [facility] ENDOSCOPY;  Service: Gastroenterology;  Laterality: N/A;   EYE SURGERY     GIVENS CAPSULE STUDY N/A 03/29/2020   Procedure: GIVENS CAPSULE STUDY;  Surgeon: Lin Landsman, MD;  Location: [facility] ENDOSCOPY;  Service: Gastroenterology;  Laterality: N/A;   HEMORRHOID BANDING  03/25/2018   Procedure: HEMORRHOID BANDING;  Surgeon: Lin Landsman, MD;  Location: [facility] ENDOSCOPY;  Service: Gastroenterology;;   IR EMBO ART  VEN HEMORR LYMPH EXTRAV  INC GUIDE ROADMAPPING  08/07/2018   IR RADIOLOGIST EVAL & MGMT  09/03/2018   IR RADIOLOGIST EVAL & MGMT  12/17/2018   IR RADIOLOGIST EVAL & MGMT  06/24/2019   IR RADIOLOGIST EVAL & MGMT  01/22/2020   IR RADIOLOGIST EVAL & MGMT  02/09/2021   IR TIPS  08/07/2018   IR TRANSHEPATIC PORTOGRAM W HEMO  03/07/2021   IR US GUIDE VASC ACCESS RIGHT  03/07/2021   IR VENO/JUGULAR LEFT  03/07/2021   IVC FILTER INSERTION N/A 04/28/2021   Procedure: IVC FILTER INSERTION;  Surgeon: Algernon Huxley, MD;  Location: [place] CV LAB;  Service: Cardiovascular;  Laterality: N/A;   PORTA CATH INSERTION N/A 07/30/2020   Procedure: PORTA CATH INSERTION;  Surgeon: Katha Cabal, MD;  Location: [place] CV LAB;  Service: Cardiovascular;  Laterality: N/A;   RADIOLOGY WITH ANESTHESIA N/A 08/07/2018   Procedure: TIPS;  Surgeon: Corrie Mckusick, DO;  Location: [place];  Service: Anesthesiology;  Laterality: N/A;   RIGHT HEART CATH N/A 05/20/2021   Procedure: RIGHT HEART CATH;  Surgeon: Nelva Bush, MD;  Location:  [place] CV LAB;  Service: Cardiovascular;  Laterality: N/A;   TONSILLECTOMY      Family History  Problem Relation Age of Onset   CAD Father    Heart attack Father 60   Cancer Maternal Uncle    Seizures Mother     Social History   Tobacco Use   Smoking status: Never   Smokeless tobacco: Never  Vaping Use   Vaping Use: Never used  Substance Use Topics   Alcohol use: Not Currently    Comment: No EtOH for 30 years.  Never a heavy drinker.   Drug use: Never    Medications: I have reviewed the patient's current medications. Prior to Admission:  Medications Prior to Admission  Medication Sig Dispense Refill Last Dose   acetaminophen (TYLENOL) 500 MG tablet Take 1 tablet (500 mg total) by mouth every 6 (six) hours as needed for mild pain, moderate pain, fever or headache. 30 tablet 0 06/08/2021 at 1045   amoxicillin-clavulanate (AUGMENTIN) 875-125 MG tablet Take 1 tablet by mouth 2 (two) times daily. For 5 days   06/08/2021   furosemide (LASIX) 80 MG tablet Take 0.5 tablets (40 mg total) by mouth daily. 180 tablet 1 06/08/2021   gabapentin (NEURONTIN) 100 MG capsule Take 100 mg by mouth daily. For 3 days   06/08/2021   hydrOXYzine (ATARAX/VISTARIL) 25 MG tablet Take 1  tablet (25 mg total) by mouth 2 (two) times daily as needed. (Patient taking differently: Take 25 mg by mouth 2 (two) times daily as needed for itching.) 30 tablet 0    lactulose (CHRONULAC) 10 GM/15ML solution Take 30 mLs (20 g total) by mouth 3 (three) times daily. 1800 mL 2 06/08/2021   metolazone (ZAROXOLYN) 5 MG tablet Take 1 tablet (5 mg total) by mouth every other day. (Patient taking differently: Take 5 mg by mouth daily.) 30 tablet 3 06/08/2021   ondansetron (ZOFRAN) 4 MG tablet Take 4 mg by mouth every 8 (eight) hours as needed for nausea or vomiting.      pantoprazole (PROTONIX) 40 MG tablet Take 1 tablet (40 mg total) by mouth 2 (two) times daily. 60 tablet 0 06/08/2021   potassium chloride SA (KLOR-CON) 20 MEQ  tablet Take 1 tablet (20 mEq total) by mouth daily. 60 tablet 3 06/08/2021   spironolactone (ALDACTONE) 50 MG tablet Take 1 tablet (50 mg total) by mouth daily. 2 tablet 0 06/08/2021   Vitamin D, Ergocalciferol, (DRISDOL) 1.25 MG (50000 UNIT) CAPS capsule Take 50,000 Units by mouth every Sunday.   Past Week   benzonatate (TESSALON) 100 MG capsule Take 1 capsule (100 mg total) by mouth 4 (four) times daily as needed. (Patient not taking: No sig reported) 20 capsule 0 Not Taking   [EXPIRED] oxyCODONE (OXY IR/ROXICODONE) 5 MG immediate release tablet Take 1 tablet (5 mg total) by mouth every 6 (six) hours as needed for up to 7 days for moderate pain or severe pain. (Patient not taking: No sig reported) 10 tablet 0 Not Taking   Scheduled:  Chlorhexidine Gluconate Cloth  6 each Topical Daily   furosemide  40 mg Oral Daily   heparin  5,000 Units Subcutaneous Q8H   lactulose  20 g Oral TID   metolazone  5 mg Oral Daily   pantoprazole  40 mg Oral BID   spironolactone  50 mg Oral Daily   Continuous: OEU:[id number] **OR** acetaminophen, morphine injection, ondansetron **OR** ondansetron (ZOFRAN) IV, polyethylene glycol  No Known Allergies   ROS:  A comprehensive review of systems was negative except for: Gastrointestinal: positive for abdominal pain  Blood pressure (!) 118/53, pulse 72, temperature 98.2 F (36.8 C), temperature source Oral, resp. rate 18, height 5' 2" (1.575 m), weight 87.1 kg, SpO2 91 %. Physical Exam Vitals reviewed.  Constitutional:      General: She is not in acute distress. HENT:     Head: Normocephalic.  Cardiovascular:     Rate and Rhythm: Normal rate.  Pulmonary:     Effort: Pulmonary effort is normal.  Abdominal:     General: There is distension.     Palpations: Abdomen is soft.     Tenderness: There is abdominal tenderness in the epigastric area and left upper quadrant.  Musculoskeletal:     Comments: Fingers with petechial changes, reports Raynauds    Skin:    General: Skin is warm.  Neurological:     General: No focal deficit present.     Mental Status: She is alert and oriented to person, place, and time.  Psychiatric:        Mood and Affect: Mood normal.        Behavior: Behavior normal.    Results: Results for orders placed or performed during the hospital encounter of 06/09/21 (from the past 48 hour(s))  CBG monitoring, ED     Status: Abnormal   Collection Time: 06/09/21  9:27  AM  Result Value Ref Range   Glucose-Capillary 117 (H) 70 - 99 mg/dL    Comment: Glucose reference range applies only to samples taken after fasting for at least 8 hours.   Comment 1 Notify RN   CBC with Differential/Platelet     Status: Abnormal   Collection Time: 06/09/21 10:57 AM  Result Value Ref Range   WBC 8.3 4.0 - 10.5 K/uL   RBC 3.57 (L) 3.87 - 5.11 MIL/uL   Hemoglobin 9.7 (L) 12.0 - 15.0 g/dL   HCT 30.7 (L) 36.0 - 46.0 %   MCV 86.0 80.0 - 100.0 fL   MCH 27.2 26.0 - 34.0 pg   MCHC 31.6 30.0 - 36.0 g/dL   RDW 18.9 (H) 11.5 - 15.5 %   Platelets 217 150 - 400 K/uL   nRBC 0.0 0.0 - 0.2 %   Neutrophils Relative % 81 %   Neutro Abs 6.7 1.7 - 7.7 K/uL   Lymphocytes Relative 7 %   Lymphs Abs 0.6 (L) 0.7 - 4.0 K/uL   Monocytes Relative 10 %   Monocytes Absolute 0.9 0.1 - 1.0 K/uL   Eosinophils Relative 1 %   Eosinophils Absolute 0.1 0.0 - 0.5 K/uL   Basophils Relative 0 %   Basophils Absolute 0.0 0.0 - 0.1 K/uL   Immature Granulocytes 1 %   Abs Immature Granulocytes 0.05 0.00 - 0.07 K/uL    Comment: Performed at [facility], [address] [place], [place] [address]  Comprehensive metabolic panel     Status: Abnormal   Collection Time: 06/09/21 10:57 AM  Result Value Ref Range   Sodium 127 (L) 135 - 145 mmol/L   Potassium 4.3 3.5 - 5.1 mmol/L   Chloride 96 (L) 98 - 111 mmol/L   CO2 24 22 - 32 mmol/L   Glucose, Bld 181 (H) 70 - 99 mg/dL    Comment: Glucose reference range applies only to samples taken after fasting for at least 8  hours.   BUN 48 (H) 8 - 23 mg/dL   Creatinine, Ser 1.65 (H) 0.44 - 1.00 mg/dL   Calcium 7.8 (L) 8.9 - 10.3 mg/dL   Total Protein 5.7 (L) 6.5 - 8.1 g/dL   Albumin 1.7 (L) 3.5 - 5.0 g/dL   AST 27 15 - 41 U/L   ALT 16 0 - 44 U/L   Alkaline Phosphatase 165 (H) 38 - 126 U/L   Total Bilirubin 1.2 0.3 - 1.2 mg/dL   GFR, Estimated 33 (L) >60 mL/min    Comment: (NOTE) Calculated using the CKD-EPI Creatinine Equation (2021)    Anion gap 7 5 - 15    Comment: Performed at [facility], [address], [place], [place] [address]  Lipase, blood     Status: Abnormal   Collection Time: 06/09/21 10:57 AM  Result Value Ref Range   Lipase 69 (H) 11 - 51 U/L    Comment: Performed at [facility], [address], [place], [place] [address]  Resp Panel by RT-PCR (Flu A&B, Covid) Nasopharyngeal Swab     Status: None   Collection Time: 06/09/21  7:25 PM   Specimen: Nasopharyngeal Swab; Nasopharyngeal(NP) swabs in vial transport medium  Result Value Ref Range   SARS Coronavirus 2 by RT PCR NEGATIVE NEGATIVE    Comment: (NOTE) SARS-CoV-2 target nucleic acids are NOT DETECTED.  The SARS-CoV-2 RNA is generally detectable in upper respiratory specimens during the acute phase of infection. The lowest concentration of SARS-CoV-2 viral copies this assay  can detect is 138 copies/mL. A negative result does not preclude SARS-Cov-2 infection and should not be used as the sole basis for treatment or other patient management decisions. A negative result may occur with  improper specimen collection/handling, submission of specimen other than nasopharyngeal swab, presence of viral mutation(s) within the areas targeted by this assay, and inadequate number of viral copies(<138 copies/mL). A negative result must be combined with clinical observations, patient history, and epidemiological information. The expected result is Negative.  Fact Sheet for Patients:  EntrepreneurPulse.com.au  Fact Sheet for  Healthcare Providers:  IncredibleEmployment.be  This test is no t yet approved or cleared by the [place] FDA and  has been authorized for detection and/or diagnosis of SARS-CoV-2 by FDA under an Emergency Use Authorization (EUA). This EUA will remain  in effect (meaning this test can be used) for the duration of the COVID-19 declaration under Section 564(b)(1) of the Act, 21 U.S.C.section 360bbb-3(b)(1), unless the authorization is terminated  or revoked sooner.       Influenza A by PCR NEGATIVE NEGATIVE   Influenza B by PCR NEGATIVE NEGATIVE    Comment: (NOTE) The Xpert Xpress SARS-CoV-2/FLU/RSV plus assay is intended as an aid in the diagnosis of influenza from Nasopharyngeal swab specimens and should not be used as a sole basis for treatment. Nasal washings and aspirates are unacceptable for Xpert Xpress SARS-CoV-2/FLU/RSV testing.  Fact Sheet for Patients: EntrepreneurPulse.com.au  Fact Sheet for Healthcare Providers: IncredibleEmployment.be  This test is not yet approved or cleared by the [place] FDA and has been authorized for detection and/or diagnosis of SARS-CoV-2 by FDA under an Emergency Use Authorization (EUA). This EUA will remain in effect (meaning this test can be used) for the duration of the COVID-19 declaration under Section 564(b)(1) of the Act, 21 U.S.C. section 360bbb-3(b)(1), unless the authorization is terminated or revoked.  Performed at [facility], [address], [place], [place] [address]   Basic metabolic panel     Status: Abnormal   Collection Time: 06/10/21  7:00 AM  Result Value Ref Range   Sodium 129 (L) 135 - 145 mmol/L   Potassium 4.3 3.5 - 5.1 mmol/L   Chloride 99 98 - 111 mmol/L   CO2 23 22 - 32 mmol/L   Glucose, Bld 138 (H) 70 - 99 mg/dL    Comment: Glucose reference range applies only to samples taken after fasting for at least 8 hours.   BUN 42 (H) 8 - 23 mg/dL    Creatinine, Ser 1.53 (H) 0.44 - 1.00 mg/dL   Calcium 7.9 (L) 8.9 - 10.3 mg/dL   GFR, Estimated 37 (L) >60 mL/min    Comment: (NOTE) Calculated using the CKD-EPI Creatinine Equation (2021)    Anion gap 7 5 - 15    Comment: Performed at [facility], [address], [place], [place] [address]  CBC     Status: Abnormal   Collection Time: 06/10/21  7:00 AM  Result Value Ref Range   WBC 7.7 4.0 - 10.5 K/uL   RBC 3.69 (L) 3.87 - 5.11 MIL/uL   Hemoglobin 9.8 (L) 12.0 - 15.0 g/dL   HCT 31.7 (L) 36.0 - 46.0 %   MCV 85.9 80.0 - 100.0 fL   MCH 26.6 26.0 - 34.0 pg   MCHC 30.9 30.0 - 36.0 g/dL   RDW 18.6 (H) 11.5 - 15.5 %   Platelets 216 150 - 400 K/uL   nRBC 0.0 0.0 - 0.2 %    Comment: Performed  at [facility], [address] [place], [place] [address]  Troponin I (High Sensitivity)     Status: None   Collection Time: 06/10/21  9:06 AM  Result Value Ref Range   Troponin I (High Sensitivity) 9 <18 ng/L    Comment: (NOTE) Elevated high sensitivity troponin I (hsTnI) values and significant  changes across serial measurements may suggest ACS but many other  chronic and acute conditions are known to elevate hsTnI results.  Refer to the "Links" section for chest pain algorithms and additional  guidance. Performed at [facility], [address], [place], [place] [address]   Protime-INR     Status: Abnormal   Collection Time: 06/10/21  9:06 AM  Result Value Ref Range   Prothrombin Time 15.9 (H) 11.4 - 15.2 seconds   INR 1.3 (H) 0.8 - 1.2    Comment: (NOTE) INR goal varies based on device and disease states. Performed at [facility], [address] [place], [place] [address]    Personally reviewed- small peripheral area of infarct on spleen   CT ABDOMEN PELVIS W CONTRAST  Result Date: 06/09/2021 CLINICAL DATA:  Abdominal pain EXAM: CT ABDOMEN AND PELVIS WITH CONTRAST TECHNIQUE: Multidetector CT imaging of the abdomen and pelvis was performed using the standard protocol following bolus  administration of intravenous contrast. CONTRAST:  21m OMNIPAQUE IOHEXOL 300 MG/ML  SOLN COMPARISON:  CT abdomen and pelvis dated August 05, 2018 FINDINGS: Lower chest: Moderate loculated right pleural effusion with atelectasis. Trace left pleural effusion. Cardiomegaly with coronary artery calcifications. No pericardial effusion. Hepatobiliary: No suspicious focal liver lesions tips shunt is in place and appears patent. Cholelithiasis no evidence of acute cholecystitis. No biliary ductal dilation. Pancreas: Embolization coils noted near the pancreatic head. Pancreas is unremarkable. Spleen: Splenomegaly. Wedge-shaped peripheral area of hypoattenuation seen in the spleen compatible with splenic infarct. Adrenals/Urinary Tract: Bilateral adrenal glands are unremarkable. Bilateral kidneys are normal in appearance. Normal bladder. Stomach/Bowel: Bowel is normal in caliber with no evidence of obstruction or wall thickening. Normal appendix. Normal appearance of the stomach. Vascular/Lymphatic: Aortic atherosclerotic disease no pathologically enlarged nodes seen in the abdomen or pelvis. IVC filter noted in the infrarenal IVC. Reproductive: Uterus is present. Cystic lesion the left adnexa, measuring up to 4.1 cm, was previously seen on prior CT when it measured to 6.5 cm. Other: No abdominal ascites.  Now free intraperitoneal air. Soft tissue anasarca Musculoskeletal: Grade 1 anterolisthesis of L4 on L5. No acute osseous abnormalities. IMPRESSION: New splenic infarct. Patent TIPS. Partially visualized moderate loculated right pleural effusion. Electronically Signed   By: LYetta GlassmanMD   On: 06/09/2021 14:39     Assessment & Plan:  BShaunette GassnerKey is a 70y.o. female with an new splenic infarct and no other evidence of thromboembolus with contrasted CT. Splenomegaly is a risk factor for infarct. She also has had prior blood clots and could have some hypercoagulable state . Hospitalist checking for  antiphospholipid syndrome.  Pain control Likely will not be able to tolerate anticoagulation given risk of bleeding from cirrhosis, GI sources etc  Nothing surgical to do  GI following for cirrhosis  All questions were answered to the satisfaction of the patient.     LVirl Cagey7/29/2022, 10:41 AM

## 2021-06-10 NOTE — Plan of Care (Signed)
  Problem: Acute Rehab PT Goals(only PT should resolve) Goal: Pt Will Go Supine/Side To Sit Outcome: Progressing Flowsheets (Taken 06/10/2021 1623) Pt will go Supine/Side to Sit: with moderate assist Goal: Patient Will Transfer Sit To/From Stand Outcome: Progressing Flowsheets (Taken 06/10/2021 1623) Patient will transfer sit to/from stand: with moderate assist Goal: Pt Will Transfer Bed To Chair/Chair To Bed Outcome: Progressing Flowsheets (Taken 06/10/2021 1623) Pt will Transfer Bed to Chair/Chair to Bed:  with mod assist  with max assist Goal: Pt Will Ambulate Outcome: Progressing Flowsheets (Taken 06/10/2021 1623) Pt will Ambulate:  10 feet  with moderate assist  with rolling walker   4:23 PM, 06/10/21 Lonell Grandchild, MPT Physical Therapist with [facility] [phone] office [phone] mobile phone

## 2021-06-10 NOTE — Progress Notes (Signed)
Patient does not want to be discharged back to [facility].Patient  is interested in home health at discharge.

## 2021-06-10 NOTE — Consult Note (Signed)
_0 @   Referring Provider: [facility] hospital[facility] Primary Care Physician:  Caprice Renshaw, MD Primary Gastroenterologist: [facility]  Date of Admission: 06/09/21 Date of Consultation: 06/10/21  Reason for Consultation: Splenic infarct, liver cirrhosis with esophageal varices  HPI:  Crystal [name] is a 70 y.o. 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  ED course: Hemodynamically stable. Hemoglobin 9.7 (stable), platelets 217, sodium 127, potassium 4.3, BUN 48, creatinine 1.65, albumin 1.7, alk phos 165, AST 27, ALT 16.  Lipase 69. COVID-negative. CT A/P with contrast with moderate loculated right pleural effusion with atelectasis, trace left pleural effusion, new splenic infarct, patent TIPS. Hospitalist consulted to admit.  GI was subsequently consulted to weigh in on risk/benefits of anticoagulation.  Today: Patient complains of abdominal pain primarily epigastric that radiates to her left upper quadrant.  Associated nausea without vomiting.  No melena hematochezia.  MELD 22 in June 2022.   May 2022 found to have acute right lower extremity DVT and chronic DVT of left lower extremity, started on Eliquis.  Later hospitalized in June with upper GI bleed, hemoglobin 5.9, down from 10.0 on 5/27.  Anticoagulation was discontinued.  IVC filter was placed.  EGD 04/28/2021: 5 mm AVM in the gastric body s/p APC therapy, severe diffuse gastric antral vascular ectasia  without bleeding in the gastric antrum, normal esophagus and duodenum.  Past Medical History:  Diagnosis Date   Acute GI bleeding 02/09/2018   Acute upper gastrointestinal bleeding 08/03/2018   Anemia    TAKES IRON TAB   Arthritis    SHOULDER   B12 deficiency 02/18/2018   Bleeding    GI  3/19   Bronchitis    HX OF   CHF (congestive heart failure) (HCC)    Cirrhosis (HCC)    Colon cancer screening    Diabetes mellitus without complication ([place])    TYPE 2   Fatty (change of) liver, not elsewhere classified    Full dentures    UPPER AND LOWER   Iron deficiency anemia due to chronic blood loss 02/18/2018   Lung nodule, multiple    Pernicious anemia 02/22/2018   PUD (peptic ulcer disease)    Raynaud's syndrome    Upper GI bleeding 08/03/2018    Past Surgical History:  Procedure Laterality Date   CATARACT EXTRACTION W/PHACO Right 02/06/2018   Procedure: CATARACT EXTRACTION PHACO AND INTRAOCULAR LENS PLACEMENT ([employer]) RIGHT DIABETIC;  Surgeon: Leandrew Koyanagi, MD;  Location: [place];  Service: Ophthalmology;  Laterality: Right;  DIABETIC, ORAL MED   CATARACT EXTRACTION W/PHACO Left 04/30/2018   Procedure: CATARACT EXTRACTION PHACO AND INTRAOCULAR LENS PLACEMENT ([facility]);  Surgeon: Leandrew Koyanagi, MD;  Location: [facility];  Service: Ophthalmology;  Laterality: Left;  [place] 01:04 AP% 21.3 CDE 13.66 Fluid pack lot # [id number]   COLONOSCOPY WITH PROPOFOL N/A 03/25/2018   Procedure: COLONOSCOPY WITH PROPOFOL;  Surgeon: Lin Landsman, MD;  Location: [facility];  Service: Gastroenterology;  Laterality: N/A;   DENTAL SURGERY     EXTRACTIONS   ELECTROMAGNETIC NAVIGATION  BROCHOSCOPY Right 01/27/2019   Procedure: ELECTROMAGNETIC NAVIGATION BRONCHOSCOPY;  Surgeon: [name], MD;  Location: [facility];  Service: Cardiopulmonary;  Laterality: Right;   ENTEROSCOPY N/A 07/28/2020   Procedure: Push ENTEROSCOPY;  Surgeon: Lin Landsman, MD;  Location: [facility]COPY;  Service:  Gastroenterology;  Laterality: N/A;   ESOPHAGOGASTRODUODENOSCOPY N/A 08/03/2018   Procedure: ESOPHAGOGASTRODUODENOSCOPY (EGD);  Surgeon: Lin Landsman, MD;  Location: [facility];  Service: Gastroenterology;  Laterality: N/A;   ESOPHAGOGASTRODUODENOSCOPY (EGD) WITH PROPOFOL N/A 02/10/2018   Procedure: ESOPHAGOGASTRODUODENOSCOPY (EGD) WITH PROPOFOL;  Surgeon: Jonathon Bellows, MD;  Location[facility];  Service: Gastroenterology;  Laterality: N/A;   ESOPHAGOGASTRODUODENOSCOPY (EGD) WITH PROPOFOL N/A 03/25/2018   Procedure: ESOPHAGOGASTRODUODENOSCOPY (EGD) WITH PROPOFOL;  Surgeon: Lin Landsman, MD;  Location: [facility] ENDOSCOPY;  Service: Gastroenterology;  Laterality: N/A;   ESOPHAGOGASTRODUODENOSCOPY (EGD) WITH PROPOFOL N/A 04/22/2018   Procedure: ESOPHAGOGASTRODUODENOSCOPY (EGD) WITH PROPOFOL with band ligation;  Surgeon: Lin Landsman, MD;  Location: [place];  Service: Gastroenterology;  Laterality: N/A;   ESOPHAGOGASTRODUODENOSCOPY (EGD) WITH PROPOFOL N/A 06/24/2018   Procedure: ESOPHAGOGASTRODUODENOSCOPY (EGD) WITH PROPOFOL;  Surgeon: Lin Landsman, MD;  Location[facility];  Service: Gastroenterology;  Laterality: N/A;   ESOPHAGOGASTRODUODENOSCOPY (EGD) WITH PROPOFOL N/A 11/19/2018   Procedure: ESOPHAGOGASTRODUODENOSCOPY (EGD) WITH PROPOFOL;  Surgeon: Lin Landsman, MD;  Location: [place];  Service: Gastroenterology;  Laterality: N/A;   ESOPHAGOGASTRODUODENOSCOPY (EGD) WITH PROPOFOL N/A 03/08/2020   Procedure: ESOPHAGOGASTRODUODENOSCOPY (EGD) WITH PROPOFOL;  Surgeon: Lin Landsman, MD;  Location: [facility] ENDOSCOPY;  Service: Gastroenterology;  Laterality: N/A;   ESOPHAGOGASTRODUODENOSCOPY (EGD) WITH PROPOFOL N/A 04/28/2021   Procedure: ESOPHAGOGASTRODUODENOSCOPY (EGD) WITH PROPOFOL;  Surgeon: Lin Landsman, MD;  Location: [facility] ENDOSCOPY;  Service: Gastroenterology;  Laterality: N/A;   EYE SURGERY     GIVENS CAPSULE STUDY N/A 03/29/2020    Procedure: GIVENS CAPSULE STUDY;  Surgeon: Lin Landsman, MD;  Location: [facility] ENDOSCOPY;  Service: Gastroenterology;  Laterality: N/A;   HEMORRHOID BANDING  03/25/2018   Procedure: HEMORRHOID BANDING;  Surgeon: Lin Landsman, MD;  Location: [facility] ENDOSCOPY;  Service: Gastroenterology;;   IR EMBO ART  VEN HEMORR LYMPH EXTRAV  INC GUIDE ROADMAPPING  08/07/2018   IR RADIOLOGIST EVAL & MGMT  09/03/2018   IR RADIOLOGIST EVAL & MGMT  12/17/2018   IR RADIOLOGIST EVAL & MGMT  06/24/2019   IR RADIOLOGIST EVAL & MGMT  01/22/2020   IR RADIOLOGIST EVAL & MGMT  02/09/2021   IR TIPS  08/07/2018   IR TRANSHEPATIC PORTOGRAM W HEMO  03/07/2021   IR US GUIDE VASC ACCESS RIGHT  03/07/2021   IR VENO/JUGULAR LEFT  03/07/2021   IVC FILTER INSERTION N/A 04/28/2021   Procedure: IVC FILTER INSERTION;  Surgeon: Algernon Huxley, MD;  Location: [employer] [facility];  Service: Cardiovascular;  Laterality: N/A;   PORTA CATH INSERTION N/A 07/30/2020   Procedure: PORTA CATH INSERTION;  Surgeon: Katha Cabal, MD;  Location: [place] [facility];  Service: Cardiovascular;  Laterality: N/A;   RADIOLOGY WITH ANESTHESIA N/A 08/07/2018   Procedure: TIPS;  Surgeon: Corrie Mckusick, DO;  Location: [place];  Service: Anesthesiology;  Laterality: N/A;   RIGHT HEART CATH N/A 05/20/2021   Procedure: RIGHT HEART CATH;  Surgeon: Nelva Bush, MD;  Location: [place] [facility];  Service: Cardiovascular;  Laterality: N/A;   TONSILLECTOMY      Prior to Admission medications   Medication Sig Start Date End Date Taking? Authorizing Provider  acetaminophen (TYLENOL) 500 MG tablet Take 1 tablet (500  mg total) by mouth every 6 (six) hours as needed for mild pain, moderate pain, fever or headache. 06/02/21  Yes Val Riles, MD  amoxicillin-clavulanate (AUGMENTIN) 875-125 MG tablet Take 1 tablet by mouth 2 (two) times daily. For 5 days 06/03/21  Yes [provider]  furosemide (LASIX) 80 MG tablet Take 0.5 tablets (40 mg total) by  mouth daily. 06/02/21  Yes Val Riles, MD  gabapentin (NEURONTIN) 100 MG capsule Take 100 mg by mouth daily. For 3 days   Yes [provider]  hydrOXYzine (ATARAX/VISTARIL) 25 MG tablet Take 1 tablet (25 mg total) by mouth 2 (two) times daily as needed. Patient taking differently: Take 25 mg by mouth 2 (two) times daily as needed for itching. 04/08/21  Yes Kathie Dike, MD  lactulose (CHRONULAC) 10 GM/15ML solution Take 30 mLs (20 g total) by mouth 3 (three) times daily. 04/08/21  Yes Kathie Dike, MD  metolazone (ZAROXOLYN) 5 MG tablet Take 1 tablet (5 mg total) by mouth every other day. Patient taking differently: Take 5 mg by mouth daily. 05/20/21 09/17/21 Yes End, Harrell Gave, MD  ondansetron (ZOFRAN) 4 MG tablet Take 4 mg by mouth every 8 (eight) hours as needed for nausea or vomiting.   Yes [provider]  pantoprazole (PROTONIX) 40 MG tablet Take 1 tablet (40 mg total) by mouth 2 (two) times daily. 05/01/21 06/09/21 Yes Sreenath, Sudheer B, MD  potassium chloride SA (KLOR-CON) 20 MEQ tablet Take 1 tablet (20 mEq total) by mouth daily. 06/02/21 09/30/21 Yes Val Riles, MD  spironolactone (ALDACTONE) 50 MG tablet Take 1 tablet (50 mg total) by mouth daily. 06/02/21  Yes Val Riles, MD  Vitamin D, Ergocalciferol, (DRISDOL) 1.25 MG (50000 UNIT) CAPS capsule Take 50,000 Units by mouth every Sunday. 01/14/21  Yes [provider]  benzonatate (TESSALON) 100 MG capsule Take 1 capsule (100 mg total) by mouth 4 (four) times daily as needed. Patient not taking: No sig reported 05/01/21   Sidney Ace, MD    Current Facility-Administered Medications  Medication Dose Route Frequency Provider Last Rate Last Admin   acetaminophen (TYLENOL) tablet 650 mg  650 mg Oral Q6H PRN Emokpae, Ejiroghene E, MD       Or   acetaminophen (TYLENOL) suppository 650 mg  650 mg Rectal Q6H PRN Emokpae, Ejiroghene E, MD       Chlorhexidine Gluconate Cloth 2 % PADS 6 each  6 each Topical  Daily Emokpae, Ejiroghene E, MD       furosemide (LASIX) tablet 40 mg  40 mg Oral Daily Emokpae, Ejiroghene E, MD       heparin injection 5,000 Units  5,000 Units Subcutaneous Q8H Emokpae, Ejiroghene E, MD   5,000 Units at 06/09/21 2210   lactulose (CHRONULAC) 10 GM/15ML solution 20 g  20 g Oral TID Emokpae, Ejiroghene E, MD   20 g at 06/09/21 2209   metolazone (ZAROXOLYN) tablet 5 mg  5 mg Oral Daily Emokpae, Ejiroghene E, MD       morphine 2 MG/ML injection 2 mg  2 mg Intravenous Q4H PRN Emokpae, Ejiroghene E, MD   2 mg at 06/10/21 0729   ondansetron (ZOFRAN) tablet 4 mg  4 mg Oral Q6H PRN Emokpae, Ejiroghene E, MD       Or   ondansetron (ZOFRAN) injection 4 mg  4 mg Intravenous Q6H PRN Emokpae, Ejiroghene E, MD       pantoprazole (PROTONIX) EC tablet 40 mg  40 mg Oral BID Emokpae, Ejiroghene E,  MD   40 mg at 06/09/21 2209   polyethylene glycol (MIRALAX / GLYCOLAX) packet 17 g  17 g Oral Daily PRN Emokpae, Ejiroghene E, MD       spironolactone (ALDACTONE) tablet 50 mg  50 mg Oral Daily Emokpae, Ejiroghene E, MD       Facility-Administered Medications Ordered in Other Encounters  Medication Dose Route Frequency Provider Last Rate Last Admin   sodium chloride flush (NS) 0.9 % injection 10 mL  10 mL Intravenous PRN Nolon Stalls C, MD   10 mL at 09/14/20 1320    Allergies as of 06/09/2021   (No Known Allergies)    Family History  Problem Relation Age of Onset   CAD Father    Heart attack Father 73   Cancer Maternal Uncle    Seizures Mother     Social History   Socioeconomic History   Marital status: Single    Spouse name: Not on file   Number of children: Not on file   Years of education: Not on file   Highest education level: Not on file  Occupational History   Not on file  Tobacco Use   Smoking status: Never   Smokeless tobacco: Never  Vaping Use   Vaping Use: Never used  Substance and Sexual Activity   Alcohol use: Not Currently    Comment: No EtOH for 30 years.   Never a heavy drinker.   Drug use: Never   Sexual activity: Not Currently  Other Topics Concern   Not on file  Social History Narrative   Independent at baseline. Lives at home with family   Social Determinants of Health   Financial Resource Strain: Not on file  Food Insecurity: No Food Insecurity   Worried About Charity fundraiser in the Last Year: Never true   Arboriculturist in the Last Year: Never true  Transportation Needs: No Transportation Needs   Lack of Transportation (Medical): No   Lack of Transportation (Non-Medical): No  Physical Activity: Not on file  Stress: Not on file  Social Connections: Not on file  Intimate Partner Violence: Not on file    Review of Systems: Gen: Denies fever, chills, loss of appetite, change in weight or weight loss CV: Denies chest pain, heart palpitations, syncope, edema  Resp: Denies shortness of breath with rest, cough, wheezing GI: Denies dysphagia or odynophagia. Denies vomiting blood, jaundice, and fecal incontinence.  GU : Denies urinary burning, urinary frequency, urinary incontinence.  MS: Denies joint pain,swelling, cramping Derm: Denies rash, itching, dry skin Psych: Denies depression, anxiety,confusion, or memory loss Heme: Denies bruising, bleeding, and enlarged lymph nodes.  Physical Exam: Vital signs in last 24 hours: Temp:  [98.2 F (36.8 C)-99.4 F (37.4 C)] 98.2 F (36.8 C) (07/29 0744) Pulse Rate:  [30-88] 72 (07/29 0744) Resp:  [15-30] 18 (07/29 0744) BP: (103-133)/(50-70) 118/53 (07/29 0744) SpO2:  [82 %-100 %] 91 % (07/29 0744) Weight:  [86.2 kg-87.1 kg] 87.1 kg (07/28 2039)   General:   Alert,  Well-developed, well-nourished, pleasant and cooperative in NAD Head:  Normocephalic and atraumatic. Eyes:  Sclera clear, no icterus.   Conjunctiva pink. Ears:  Normal auditory acuity. Nose:  No deformity, discharge,  or lesions. Mouth:  No deformity or lesions, dentition normal. Neck:  Supple; no masses or  thyromegaly. Lungs:  Clear throughout to auscultation.   No wheezes, crackles, or rhonchi. No acute distress. Heart:  Regular rate and rhythm; no murmurs, clicks, rubs,  or  gallops. Abdomen:  Soft, tender to palpation epigastric and LUQ, and nondistended. No masses, hepatosplenomegaly or hernias noted. Normal bowel sounds, without guarding, and without rebound.     Msk:  Symmetrical without gross deformities. Normal posture. Extremities:  Without clubbing or edema. Neurologic:  Alert and  oriented x4;  grossly normal neurologically. Skin:  Intact without significant lesions or rashes. Cervical Nodes:  No significant cervical adenopathy. Psych:  Alert and cooperative. Normal mood and affect.  Intake/Output from previous day: 07/28 0701 - 07/29 0700 In: 1016.7 [IV Piggyback:1016.7] Out: 400 [Urine:400] Intake/Output this shift: No intake/output data recorded.  Lab Results: Recent Labs    06/09/21 1057 06/10/21 0700  WBC 8.3 7.7  HGB 9.7* 9.8*  HCT 30.7* 31.7*  PLT 217 216   BMET Recent Labs    06/09/21 1057 06/10/21 0700  NA 127* 129*  K 4.3 4.3  CL 96* 99  CO2 24 23  GLUCOSE 181* 138*  BUN 48* 42*  CREATININE 1.65* 1.53*  CALCIUM 7.8* 7.9*   LFT Recent Labs    06/09/21 1057  PROT 5.7*  ALBUMIN 1.7*  AST 27  ALT 16  ALKPHOS 165*  BILITOT 1.2   Studies/Results: CT ABDOMEN PELVIS W CONTRAST  Result Date: 06/09/2021 CLINICAL DATA:  Abdominal pain EXAM: CT ABDOMEN AND PELVIS WITH CONTRAST TECHNIQUE: Multidetector CT imaging of the abdomen and pelvis was performed using the standard protocol following bolus administration of intravenous contrast. CONTRAST:  80m OMNIPAQUE IOHEXOL 300 MG/ML  SOLN COMPARISON:  CT abdomen and pelvis dated August 05, 2018 FINDINGS: Lower chest: Moderate loculated right pleural effusion with atelectasis. Trace left pleural effusion. Cardiomegaly with coronary artery calcifications. No pericardial effusion. Hepatobiliary: No suspicious  focal liver lesions tips shunt is in place and appears patent. Cholelithiasis no evidence of acute cholecystitis. No biliary ductal dilation. Pancreas: Embolization coils noted near the pancreatic head. Pancreas is unremarkable. Spleen: Splenomegaly. Wedge-shaped peripheral area of hypoattenuation seen in the spleen compatible with splenic infarct. Adrenals/Urinary Tract: Bilateral adrenal glands are unremarkable. Bilateral kidneys are normal in appearance. Normal bladder. Stomach/Bowel: Bowel is normal in caliber with no evidence of obstruction or wall thickening. Normal appendix. Normal appearance of the stomach. Vascular/Lymphatic: Aortic atherosclerotic disease no pathologically enlarged nodes seen in the abdomen or pelvis. IVC filter noted in the infrarenal IVC. Reproductive: Uterus is present. Cystic lesion the left adnexa, measuring up to 4.1 cm, was previously seen on prior CT when it measured to 6.5 cm. Other: No abdominal ascites.  Now free intraperitoneal air. Soft tissue anasarca Musculoskeletal: Grade 1 anterolisthesis of L4 on L5. No acute osseous abnormalities. IMPRESSION: New splenic infarct. Patent TIPS. Partially visualized moderate loculated right pleural effusion. Electronically Signed   By: LYetta GlassmanMD   On: 06/09/2021 14:39    Impression: *Splenic infarct-new *Abdominal pain due to above *Decompensated cirrhosis-secondary to NASH/hepatitis C, meld 22 *Esophageal varices status post TIPS *Hepatic encephalopathy  Plan: - Given patient's high risk of GI bleeding on systemic anticoagulation, would recommend holding off on restarting apixaban. - Recommend pain control for new splenic infarct.  Antiemetics for nausea - Hypercoagulable work-up underway, will follow - Continue on PPI therapy - Continue lactulose for hepatic encephalopathy   Thank you for the consultation, GI to continue to follow.   LOS: 0 days    06/10/2021, 8:36 AM

## 2021-06-10 NOTE — Progress Notes (Addendum)
PROGRESS NOTE  CACI ORREN QJJ:[id number] DOB: 1951-04-07 DOA: 06/09/2021 PCP: Caprice Renshaw, MD  Brief History:  70 year old female with a history of NASH liver cirrhosis, esophageal varices status post TIPS procedure, diastolic CHF, DVTs in the lower extremities, upper GI bleed, coronary artery disease, Raynaud's phenomenon presenting with 3 days of abdominal pain primarily in the left upper quadrant area.  The patient denies any fevers, chills, shortness of breath, nausea, vomiting, hematemesis, hematochezia, melena.  She has chronic loose stools secondary to her lactulose.  The patient has also been complaining of chest discomfort, but states that she has not had any shortness of breath, palpitations, dizziness.  There is no dysuria, hematuria.  The patient has had a rather complicated history with many recent hospitalizations as detailed below. Recent hospitalization 7/11-7/22 for distal nondisplaced right femur fracture, patient was managed nonoperatively due to significant perioperative risk.  Also treated for hepatic encephalopathy with lactulose enemas, urinary tract infection with IV ceftriaxone transition to oral Augmentin.   Hospitalization 6/15- 05/01/21 for anemia due to GI blood loss, Hgb was 5.9 required 2 units of blood, also treated with IV pantoprazole and octreotide.  EGD 6/16 showed single nonbleeding angiectasia in the stomach, severe diffuse antral vascular ectasia both treated with argon plasma coagulation.  Was also treated for aspiration pneumonia during the hospitalization.   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  Repeat Ultrasound 05/19/2021 demonstrated subacute DVT, and acute occlusive superficial thrombophlebitis of the right small saphenous vein. Patient was started on anticoagulation with Eliquis which she took for just a week before she presented to the ED 04/2021 with evidence of GI blood  loss.  Anticoagulation was discontinued.  An IVC filter was placed by vascular surgeon Dr. Lucky Cowboy 6/16. ED Course: Stable vitals.  Sodium 127.  Creatinine stable 1.65.  Hgb 9.7.  Platelets 217.  Abdominal CT showed new splenic infarct, patent TIPS, partially visualized moderate loculated right pleural effusion. EDP talked to GI, who will see the patient in the hospital, also recommended talking to general surgery.  Assessment/Plan: Splenic infarct -06/09/2021 CT abd/pelvis-- new wedge-shaped peripheral area of hypoattenuation seen in the spleen compatible with splenic infarct. -Very difficult situation given the patient's hx of GAVE, UGIB ambulating previously on apixaban -Patient also has recent history of DVTs in the legs -GI consult to help weigh risks/benefits of AC -at minimum, would like to start patient on ASA -Patient likely has underlying hypercoagulable state although unclear if this is acquired or hereditary -Check lupus anticoagulant, prothrombin gene mutation, factor V Leiden -Start aspirin 81 mg daily if AC is not an option -Echo  Subacute DVT right popliteal vein -05/28/2021 subacute DVT right popliteal vein, acute superficial thrombophlebitis right small saphenous vein, remote DVT left common femoral vein -Status post IVC filter 04/28/2021, Dr. Lucky Cowboy  GAVE/esophageal varices/history of GI bleeding -GI consult as discussed -Continue Protonix -Baseline hemoglobin 9-10  Closed fracture of the right lower extremity -managed nonoperatively due to high perioperative risk. -Continue nonweightbearing, brace to right knee -PT eval   NASH Liver cirrhosis/esophageal varices/hepatic encephalopathy -stable.   -Reports compliance with lactulose -Resume home lactulose -Continue Lasix and spironolactone  Chronic diastolic CHF -02/19/1855 echo EF 55 to 60%, no WMA, G1DD -Appears clinically euvolemic -Resume Lasix 40 mg daily, metolazone 5 mg daily  Controlled Diabetes mellitus-diet  controlled. Hgba1c 5.6 - Daily fasting CBGs  CKD 4 -  baseline creatinine 1.6-1.9  Morbid Obesity -lifestyle modification -BMI 35.12   Status is: Observation  The patient will require care spanning > 2 midnights and should be moved to inpatient because: Inpatient level of care appropriate due to severity of illness  Dispo: The patient is from: SNF              Anticipated d/c is to: Home              Patient currently is not medically stable to d/c.   Difficult to place patient No        Family Communication:   Family at bedside  Consultants:  GI  Code Status:  FULL   DVT Prophylaxis:  SCDS   Procedures: As Listed in Progress Note Above  Antibiotics: None         Subjective: Patient denies fevers, chills, headache, chest pain, dyspnea, nausea, vomiting, diarrhea, abdominal pain, dysuria, hematuria, hematochezia, and melena.   Objective: Vitals:   06/10/21 0016 06/10/21 0416 06/10/21 0417 06/10/21 0744  BP: (!) 118/59  (!) 133/50 (!) 118/53  Pulse: 82  88 72  Resp: _0 Temp: 99.4 F (37.4 C)  99.3 F (37.4 C) 98.2 F (36.8 C)  TempSrc: Oral Oral Oral Oral  SpO2: 99%  99% 91%  Weight:      Height:        Intake/Output Summary (Last 24 hours) at 06/10/2021 0827 Last data filed at 06/09/2021 2010 Gross per 24 hour  Intake 1016.67 ml  Output 400 ml  Net 616.67 ml   Weight change:  Exam:  General:  Pt is alert, follows commands appropriately, not in acute distress HEENT: No icterus, No thrush, No neck mass, [place]/AT Cardiovascular: RRR, S1/S2, no rubs, no gallops Respiratory: bibasilar crackles. No wheeze Abdomen: Soft/+BS, non tender, non distended, no guarding Extremities: 1+ LEedema, No lymphangitis, No petechiae, No rashes, no synovitis   Data Reviewed: I have personally reviewed following labs and imaging studies Basic Metabolic Panel: Recent Labs  Lab 06/09/21 1057 06/10/21 0700  NA 127* 129*  K 4.3 4.3  CL 96* 99  CO2 24  23  GLUCOSE 181* 138*  BUN 48* 42*  CREATININE 1.65* 1.53*  CALCIUM 7.8* 7.9*   Liver Function Tests: Recent Labs  Lab 06/09/21 1057  AST 27  ALT 16  ALKPHOS 165*  BILITOT 1.2  PROT 5.7*  ALBUMIN 1.7*   Recent Labs  Lab 06/09/21 1057  LIPASE 69*   No results for input(s): AMMONIA in the last 168 hours. Coagulation Profile: No results for input(s): INR, PROTIME in the last 168 hours. CBC: Recent Labs  Lab 06/09/21 1057 06/10/21 0700  WBC 8.3 7.7  NEUTROABS 6.7  --   HGB 9.7* 9.8*  HCT 30.7* 31.7*  MCV 86.0 85.9  PLT 217 216   Cardiac Enzymes: No results for input(s): CKTOTAL, CKMB, CKMBINDEX, TROPONINI in the last 168 hours. BNP: Invalid input(s): POCBNP CBG: Recent Labs  Lab 06/03/21 1125 06/09/21 0927  GLUCAP 169* 117*   HbA1C: No results for input(s): HGBA1C in the last 72 hours. Urine analysis:    Component Value Date/Time   COLORURINE YELLOW (A) 05/28/2021 1642   APPEARANCEUR HAZY (A) 05/28/2021 1642   LABSPEC 1.009 05/28/2021 1642   PHURINE 8.0 05/28/2021 1642   GLUCOSEU NEGATIVE 05/28/2021 1642   HGBUR NEGATIVE 05/28/2021 1642   BILIRUBINUR NEGATIVE 05/28/2021 [place] 05/28/2021 1642   PROTEINUR NEGATIVE 05/28/2021 1642   NITRITE  NEGATIVE 05/28/2021 1642   LEUKOCYTESUR LARGE (A) 05/28/2021 1642   Sepsis Labs: _0 (procalcitonin:4,lacticidven:4) ) Recent Results (from the past 240 hour(s))  SARS CORONAVIRUS 2 (Reinhardt Licausi 6-24 HRS) Nasopharyngeal Nasopharyngeal Swab     Status: None   Collection Time: 06/02/21 11:10 AM   Specimen: Nasopharyngeal Swab  Result Value Ref Range Status   SARS Coronavirus 2 NEGATIVE NEGATIVE Final    Comment: (NOTE) SARS-CoV-2 target nucleic acids are NOT DETECTED.  The SARS-CoV-2 RNA is generally detectable in upper and lower respiratory specimens during the acute phase of infection. Negative results do not preclude SARS-CoV-2 infection, do not rule out co-infections with other pathogens,  and should not be used as the sole basis for treatment or other patient management decisions. Negative results must be combined with clinical observations, patient history, and epidemiological information. The expected result is Negative.  Fact Sheet for Patients: SugarRoll.be  Fact Sheet for Healthcare Providers: https://www.woods-mathews.com/  This test is not yet approved or cleared by the [place] FDA and  has been authorized for detection and/or diagnosis of SARS-CoV-2 by FDA under an Emergency Use Authorization (EUA). This EUA will remain  in effect (meaning this test can be used) for the duration of the COVID-19 declaration under Se ction 564(b)(1) of the Act, 21 U.S.C. section 360bbb-3(b)(1), unless the authorization is terminated or revoked sooner.  Performed at [facility], [place] [address] [place], [place] [address]   Resp Panel by RT-PCR (Flu A&B, Covid) Nasopharyngeal Swab     Status: None   Collection Time: 06/09/21  7:25 PM   Specimen: Nasopharyngeal Swab; Nasopharyngeal(NP) swabs in vial transport medium  Result Value Ref Range Status   SARS Coronavirus 2 by RT PCR NEGATIVE NEGATIVE Final    Comment: (NOTE) SARS-CoV-2 target nucleic acids are NOT DETECTED.  The SARS-CoV-2 RNA is generally detectable in upper respiratory specimens during the acute phase of infection. The lowest concentration of SARS-CoV-2 viral copies this assay can detect is 138 copies/mL. A negative result does not preclude SARS-Cov-2 infection and should not be used as the sole basis for treatment or other patient management decisions. A negative result may occur with  improper specimen collection/handling, submission of specimen other than nasopharyngeal swab, presence of viral mutation(s) within the areas targeted by this assay, and inadequate number of viral copies(<138 copies/mL). A negative result must be combined with clinical  observations, patient history, and epidemiological information. The expected result is Negative.  Fact Sheet for Patients:  EntrepreneurPulse.com.au  Fact Sheet for Healthcare Providers:  IncredibleEmployment.be  This test is no t yet approved or cleared by the [place] FDA and  has been authorized for detection and/or diagnosis of SARS-CoV-2 by FDA under an Emergency Use Authorization (EUA). This EUA will remain  in effect (meaning this test can be used) for the duration of the COVID-19 declaration under Section 564(b)(1) of the Act, 21 U.S.C.section 360bbb-3(b)(1), unless the authorization is terminated  or revoked sooner.       Influenza A by PCR NEGATIVE NEGATIVE Final   Influenza B by PCR NEGATIVE NEGATIVE Final    Comment: (NOTE) The Xpert Xpress SARS-CoV-2/FLU/RSV plus assay is intended as an aid in the diagnosis of influenza from Nasopharyngeal swab specimens and should not be used as a sole basis for treatment. Nasal washings and aspirates are unacceptable for Xpert Xpress SARS-CoV-2/FLU/RSV testing.  Fact Sheet for Patients: EntrepreneurPulse.com.au  Fact Sheet for Healthcare Providers: IncredibleEmployment.be  This test is not yet approved or cleared by the  [place] Architectural technologist and has been authorized for detection and/or diagnosis of SARS-CoV-2 by FDA under an Print production planner (EUA). This EUA will remain in effect (meaning this test can be used) for the duration of the COVID-19 declaration under Section 564(b)(1) of the Act, 21 U.S.C. section 360bbb-3(b)(1), unless the authorization is terminated or revoked.  Performed at [facility], [address] [place], [place] [address]      Scheduled Meds:  Chlorhexidine Gluconate Cloth  6 each Topical Daily   furosemide  40 mg Oral Daily   heparin  5,000 Units Subcutaneous Q8H   lactulose  20 g Oral TID   metolazone  5 mg Oral  Daily   pantoprazole  40 mg Oral BID   spironolactone  50 mg Oral Daily   Continuous Infusions:  Procedures/Studies: CT Head Wo Contrast  Result Date: 05/23/2021 CLINICAL DATA:  Pain after fall EXAM: CT HEAD WITHOUT CONTRAST CT CERVICAL SPINE WITHOUT CONTRAST TECHNIQUE: Multidetector CT imaging of the head and cervical spine was performed following the standard protocol without intravenous contrast. Multiplanar CT image reconstructions of the cervical spine were also generated. COMPARISON:  Apr 04, 2021 FINDINGS: CT HEAD FINDINGS Brain: Similar mild age related global parenchymal volume loss. Stable mild burden of chronic ischemic white matter microvascular disease. No evidence of acute large vascular territory infarction, hemorrhage, hydrocephalus, extra-axial collection or mass lesion/mass effect. Vascular: No hyperdense vessel. Atherosclerotic calcifications of the internal carotid and vertebral arteries at the skull base. Skull: Normal. Negative for fracture or focal lesion. Sinuses/Orbits: Visualized portions of the paranasal sinuses and mastoid air cells are predominantly clear. Orbits are grossly unremarkable. Other: None CT CERVICAL SPINE FINDINGS Alignment: Mild loss of the normal cervical lordosis. No evidence of traumatic listhesis. 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 No acute fracture. Soft tissues and spinal canal: No prevertebral fluid or swelling. No visible canal hematoma. Disc levels: Moderate multilevel discogenic disease most notable in the lower cervical spine. Upper chest: No acute abnormality. Other: Calcifications in the bilateral parotid glands. Carotid artery calcifications. Partially visualized right chest wall Port-A-Cath. IMPRESSION: 1. No evidence of acute intracranial process. 2. No evidence of acute fracture or traumatic listhesis of the cervical spine. 3. Moderate  multilevel discogenic disease of the cervical spine. 4. 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 Electronically Signed   By: Dahlia Bailiff MD   On: 05/23/2021 17:53   CT Cervical Spine Wo Contrast  Result Date: 05/23/2021 CLINICAL DATA:  Pain after fall EXAM: CT HEAD WITHOUT CONTRAST CT CERVICAL SPINE WITHOUT CONTRAST TECHNIQUE: Multidetector CT imaging of the head and cervical spine was performed following the standard protocol without intravenous contrast. Multiplanar CT image reconstructions of the cervical spine were also generated. COMPARISON:  Apr 04, 2021 FINDINGS: CT HEAD FINDINGS Brain: Similar mild age related global parenchymal volume loss. Stable mild burden of chronic ischemic white matter microvascular disease. No evidence of acute large vascular territory infarction, hemorrhage, hydrocephalus, extra-axial collection or mass lesion/mass effect. Vascular: No hyperdense vessel. Atherosclerotic calcifications of the internal carotid and vertebral arteries at the skull base. Skull: Normal. Negative for fracture or focal lesion. Sinuses/Orbits: Visualized portions of the paranasal sinuses and mastoid air cells are predominantly clear. Orbits are grossly unremarkable. Other: None CT CERVICAL SPINE FINDINGS Alignment: Mild loss of the normal cervical lordosis. No evidence of traumatic  listhesis. 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 No acute fracture. Soft tissues and spinal canal: No prevertebral fluid or swelling. No visible canal hematoma. Disc levels: Moderate multilevel discogenic disease most notable in the lower cervical spine. Upper chest: No acute abnormality. Other: Calcifications in the bilateral parotid glands. Carotid artery calcifications. Partially visualized right chest wall Port-A-Cath.  IMPRESSION: 1. No evidence of acute intracranial process. 2. No evidence of acute fracture or traumatic listhesis of the cervical spine. 3. 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 4. 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 Electronically Signed   By: Dahlia Bailiff MD   On: 05/23/2021 17:53   CT Knee Right Wo Contrast  Result Date: 05/23/2021 CLINICAL DATA:  Fall EXAM: CT OF THE RIGHT KNEE WITHOUT CONTRAST TECHNIQUE: Multidetector CT imaging of the right knee was performed according to the standard protocol. Multiplanar CT image reconstructions were also generated. COMPARISON:  None. FINDINGS: There is a nondisplaced fracture of the distal right femur with fracture lines extending to the supracondylar lateral femoral surface. Fracture line of the middle portion of the distal femoral shaft extends to intercondylar surface. Fracture lines best delineated on series 4 and series 101. Right suprapatellar hemarthrosis. IMPRESSION: 1. Nondisplaced fracture of the distal right femur with fracture lines extending to the supracondylar lateral femoral cortex and intercondylar surface. 2. Right suprapatellar hemarthrosis. Electronically Signed   By: Ulyses Jarred M.D.   On: 05/23/2021 21:23   MR BRAIN WO CONTRAST  Result Date: 05/27/2021 CLINICAL DATA:  Mental status change, unknown cause. EXAM: MRI HEAD WITHOUT CONTRAST TECHNIQUE: Multiplanar, multiecho pulse sequences of the brain and surrounding structures were obtained without intravenous contrast. COMPARISON:  Head CT May 23, 2021. FINDINGS: Brain: No acute infarction, hemorrhage, hydrocephalus, extra-axial collection or mass lesion. A few foci of T2 hyperintensity are seen within the white matter of the cerebral hemispheres, nonspecific. Vascular: Normal flow voids. Skull and upper cervical spine: Normal marrow signal. Sinuses/Orbits: Negative. Other: None.  IMPRESSION: 1. No acute intracranial abnormality. 2. Small amount of nonspecific T2 hyperintense lesions of the white matter, may represent early chronic microangiopathy. Electronically Signed   By: Pedro Earls M.D.   On: 05/27/2021 13:10   CT ABDOMEN PELVIS W CONTRAST  Result Date: 06/09/2021 CLINICAL DATA:  Abdominal pain EXAM: CT ABDOMEN AND PELVIS WITH CONTRAST TECHNIQUE: Multidetector CT imaging of the abdomen and pelvis was performed using the standard protocol following bolus administration of intravenous contrast. CONTRAST:  75m OMNIPAQUE IOHEXOL 300 MG/ML  SOLN COMPARISON:  CT abdomen and pelvis dated August 05, 2018 FINDINGS: Lower chest: Moderate loculated right pleural effusion with atelectasis. Trace left pleural effusion. Cardiomegaly with coronary artery calcifications. No pericardial effusion. Hepatobiliary: No suspicious focal liver lesions tips shunt is in place and appears patent. Cholelithiasis no evidence of acute cholecystitis. No biliary ductal dilation. Pancreas: Embolization coils noted near the pancreatic head. Pancreas is unremarkable. Spleen: Splenomegaly. Wedge-shaped peripheral area of hypoattenuation seen in the spleen compatible with splenic infarct. Adrenals/Urinary Tract: Bilateral adrenal glands are unremarkable. Bilateral kidneys are normal in appearance. Normal bladder. Stomach/Bowel: Bowel is normal in caliber with no evidence of obstruction or wall thickening. Normal appendix. Normal appearance of the stomach. Vascular/Lymphatic: Aortic atherosclerotic disease no pathologically enlarged nodes seen in the abdomen or pelvis. IVC filter noted in the infrarenal IVC. Reproductive:  Uterus is present. Cystic lesion the left adnexa, measuring up to 4.1 cm, was previously seen on prior CT when it measured to 6.5 cm. Other: No abdominal ascites.  Now free intraperitoneal air. Soft tissue anasarca Musculoskeletal: Grade 1 anterolisthesis of L4 on L5. No acute  osseous abnormalities. IMPRESSION: New splenic infarct. Patent TIPS. Partially visualized moderate loculated right pleural effusion. Electronically Signed   By: Yetta Glassman MD   On: 06/09/2021 14:39   CARDIAC CATHETERIZATION  Result Date: 05/20/2021 Conclusions: 1. Normal left heart and pulmonary artery pressures. 2. Mildly elevated right heart filling pressures. 3. Normal cardiac output/index. Recommendations: 1. Chronic lower extremity edema does not appear to be driven primarily by heart failure. 2. Continue current doses of furosemide and spironolactone.  Decrease metolazone to 5 mg every other day to lessen risk for worsening renal insufficiency. 3. Further management per hepatology and nephrology, as chronic edema favored to be due to chronic liver and kidney disease. Nelva Bush, MD [facility]   US RENAL  Result Date: 05/27/2021 CLINICAL DATA:  Acute kidney injury EXAM: RENAL / URINARY TRACT ULTRASOUND COMPLETE COMPARISON:  None. FINDINGS: Right Kidney: Renal measurements: 10.5 x 4.4 x 3.7 cm = volume: 90.5 mL. Echogenicity within normal limits. No mass or hydronephrosis visualized. Left Kidney: Renal measurements: 10.3 x 4.0 x 4.7 cm = volume: 100 mL. Echogenicity within normal limits. No mass or hydronephrosis visualized. Bladder: Appears normal for degree of bladder distention. Other: None. IMPRESSION: Negative renal ultrasound.  No hydronephrosis. Electronically Signed   By: Julian Hy M.D.   On: 05/27/2021 19:13   US Abdomen Limited  Result Date: 05/28/2021 CLINICAL DATA:  Abdominal distension, possible ascites EXAM: LIMITED ABDOMEN ULTRASOUND FOR ASCITES TECHNIQUE: Limited ultrasound survey for ascites was performed in all four abdominal quadrants. COMPARISON:  None. FINDINGS: Ultrasound interrogation of the 4 quadrants of the abdomen demonstrates no evidence of ascites. IMPRESSION: Negative for ascites. Electronically Signed   By: Jacqulynn Cadet M.D.   On: 05/28/2021  14:40   US Venous Img Lower Bilateral (DVT)  Result Date: 05/28/2021 CLINICAL DATA:  Bilateral lower extremity edema EXAM: BILATERAL LOWER EXTREMITY VENOUS DOPPLER ULTRASOUND TECHNIQUE: Gray-scale sonography with graded compression, as well as color Doppler and duplex ultrasound were performed to evaluate the lower extremity deep venous systems from the level of the common femoral vein and including the common femoral, femoral, profunda femoral, popliteal and calf veins including the posterior tibial, peroneal and gastrocnemius veins when visible. The superficial great saphenous vein was also interrogated. Spectral Doppler was utilized to evaluate flow at rest and with distal augmentation maneuvers in the common femoral, femoral and popliteal veins. COMPARISON:  None. FINDINGS: RIGHT LOWER EXTREMITY Common Femoral Vein: No evidence of thrombus. Normal compressibility, respiratory phasicity and response to augmentation. Saphenofemoral Junction: No evidence of thrombus. Normal compressibility and flow on color Doppler imaging. Profunda Femoral Vein: No evidence of thrombus. Normal compressibility and flow on color Doppler imaging. Femoral Vein: No evidence of thrombus. Normal compressibility, respiratory phasicity and response to augmentation. Popliteal Vein: Eccentric wall thickening in the right popliteal vein consistent with wall adherent thrombus. The vessel is partially compressible. There is evidence of color flow in the patent lumen on color Doppler imaging. Calf Veins: No evidence of thrombus. Normal compressibility and flow on color Doppler imaging. Superficial Great Saphenous Vein: No evidence of thrombus. Normal compressibility. Venous Reflux:  None. Other Findings: Occlusive thrombus extends into the right small saphenous vein. LEFT LOWER EXTREMITY Common Femoral Vein: No acute  thrombus. The vessel is patent and compressible. There may be some trace eccentric wall thickening at the saphenofemoral  junction consistent with sequelae of remote and recanalized DVT. Saphenofemoral Junction: No evidence of thrombus. Normal compressibility and flow on color Doppler imaging. Profunda Femoral Vein: No evidence of thrombus. Normal compressibility and flow on color Doppler imaging. Femoral Vein: No evidence of thrombus. Normal compressibility, respiratory phasicity and response to augmentation. Popliteal Vein: No evidence of thrombus. Normal compressibility, respiratory phasicity and response to augmentation. Calf Veins: No evidence of thrombus. Normal compressibility and flow on color Doppler imaging. Superficial Great Saphenous Vein: No evidence of thrombus. Normal compressibility. Venous Reflux:  None. Other Findings:  None. IMPRESSION: 1. Subacute and partially recanalized nonocclusive DVT in the right popliteal vein. 2. Acute occlusive superficial thrombophlebitis involving the right small saphenous vein. 3. 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 Electronically Signed   By: Jacqulynn Cadet M.D.   On: 05/28/2021 14:39   DG Chest Port 1 View  Result Date: 05/28/2021 CLINICAL DATA:  Possible sepsis44 y.o. 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 EXAM: PORTABLE CHEST 1 VIEW COMPARISON:  05/23/2021 and older exams. FINDINGS: Opacity noted at the lung bases, greater on the right, consistent with atelectasis, mildly increased from the previous study. Remainder of the lungs is clear. Cardiac silhouette normal in size. Normal mediastinal and hilar contours. No convincing pleural effusion and no pneumothorax. Stable right internal jugular Port-A-Cath. IMPRESSION: 1. Mild, right greater than left, basilar atelectasis, mildly increased from the prior study. 2. No convincing pneumonia. No evidence of pulmonary edema. No other change. Electronically Signed   By: Lajean Manes M.D.   On: 05/28/2021  17:42   DG Chest Portable 1 View  Result Date: 05/23/2021 CLINICAL DATA:  Fall with blunt chest trauma EXAM: PORTABLE CHEST 1 VIEW COMPARISON:  Radiograph 04/29/2021, CT 02/01/2021 FINDINGS: Chronically coarsened interstitial and bronchitic features. More bandlike areas of opacity in the lung bases likely corresponding to areas of scarring in regions of as well as more nodular opacity in the right mid to lower lung no new consolidative process. No pneumothorax or effusion. No acute traumatic findings of the chest wall. Port-A-Cath projects over the right chest with tip in the mid SVC. Stable cardiomediastinal contours. IMPRESSION: No acute cardiopulmonary or traumatic findings. Ill-defined nodular opacities in the right lung base may reflect regions of masslike opacity seen on comparison CT and radiography. Difficult to fully assess interval change on portable radiographs. Electronically Signed   By: Lovena Le M.D.   On: 05/23/2021 19:16   DG Knee Complete 4 Views Right  Result Date: 05/23/2021 CLINICAL DATA:  Pain after fall EXAM: RIGHT KNEE - COMPLETE 4+ VIEW COMPARISON:  None. FINDINGS: No evidence of fracture or dislocation. Moderate suprapatellar joint effusion with overlying soft tissue swelling. Patellar enthesophyte. Mild tricompartment degenerative change. Vascular calcifications. IMPRESSION: 1. No acute osseous abnormality. 2. Moderate suprapatellar joint effusion with overlying soft tissue swelling 3. Mild tricompartment degenerative change. Electronically Signed   By: Dahlia Bailiff MD   On: 05/23/2021 17:56   EEG adult  Result Date: 05/30/2021 Greta Doom, MD     05/30/2021  3:00 PM History: 70 yo F with AMS Sedation: None Technique: This is a 21 channel routine scalp EEG performed at the bedside with bipolar and monopolar montages arranged in accordance to the international 10/20  system of electrode placement. One channel was dedicated to EKG recording. Background: There is a  well-formed posterior dominant rhythm of 8 Hz which is reactive to eye opening/closure.  In addition there is intrusion into the background of irregular low voltage delta and theta activity even during maximal wakefulness.  With drowsiness, there is prominent frontally predominant intermittent rhythmic delta activity(FIRDA). Photic stimulation: Physiologic driving is not performed EEG Abnormalities: 1) FIRDA 2) Generalized irregular slow activity. Clinical Interpretation: This EEG is consistent with a mild generalized nonspecific cerebral dysfunction (encephalopathy). There was no seizure or seizure predisposition recorded on this study. Please note that lack of epileptiform activity on EEG does not preclude the possibility of epilepsy. Roland Rack, MD [facility] [phone] If 7pm- 7am, please page neurology on call as listed in [place].    Orson Eva, DO  [facility]  If 7PM-7AM, please contact night-coverage [email or web address] Password [email or web address] 06/10/2021, 8:27 AM   LOS: 0 days

## 2021-06-10 NOTE — TOC Progression Note (Signed)
Transition of Care [facility]) - Progression Note    Patient Details  Name: Crystal [name] MRN: [id number] Date of Birth: 1951/02/26  [facility]) CM/SW Contact  Boneta Lucks, RN Phone Number: 06/10/2021, 4:20 PM  Clinical Narrative:   PT is recommending [place] lift. Shelia with [employer] accepted the referral.  She will get it delivered, it will be next week.   Expected Discharge Plan: [place] Barriers to Discharge: Continued Medical Work up  Expected Discharge Plan and Services Expected Discharge Plan: [place] In-house Referral: Clinical Social Work Discharge Planning Services: CM Consult Post Acute Care Choice: [place] arrangements for the past 2 months: Single Family Home                   Readmission Risk Interventions Readmission Risk Prevention Plan 04/28/2021 04/07/2021  Transportation Screening Complete Complete  Medication Review Press photographer) Complete Complete  PCP or Specialist appointment within 3-5 days of discharge - Complete  HRI or [place] Complete Complete  SW Recovery Care/Counseling Consult - Complete  Palliative Care Screening Not Applicable Not [place] [employer] Not Applicable Not Applicable  Some recent data might be hidden

## 2021-06-11 ENCOUNTER — Inpatient Hospital Stay (HOSPITAL_COMMUNITY): Payer: Medicare HMO

## 2021-06-11 DIAGNOSIS — K31819 Angiodysplasia of stomach and duodenum without bleeding: Secondary | ICD-10-CM | POA: Diagnosis not present

## 2021-06-11 DIAGNOSIS — N184 Chronic kidney disease, stage 4 (severe): Secondary | ICD-10-CM | POA: Diagnosis not present

## 2021-06-11 DIAGNOSIS — D735 Infarction of spleen: Secondary | ICD-10-CM | POA: Diagnosis not present

## 2021-06-11 DIAGNOSIS — I5032 Chronic diastolic (congestive) heart failure: Secondary | ICD-10-CM | POA: Diagnosis not present

## 2021-06-11 DIAGNOSIS — N1831 Chronic kidney disease, stage 3a: Secondary | ICD-10-CM

## 2021-06-11 DIAGNOSIS — E1122 Type 2 diabetes mellitus with diabetic chronic kidney disease: Secondary | ICD-10-CM

## 2021-06-11 LAB — BASIC METABOLIC PANEL
Anion gap: 6 (ref 5–15)
BUN: 39 mg/dL — ABNORMAL HIGH (ref 8–23)
CO2: 24 mmol/L (ref 22–32)
Calcium: 7.8 mg/dL — ABNORMAL LOW (ref 8.9–10.3)
Chloride: 97 mmol/L — ABNORMAL LOW (ref 98–111)
Creatinine, Ser: 1.5 mg/dL — ABNORMAL HIGH (ref 0.44–1.00)
GFR, Estimated: 37 mL/min — ABNORMAL LOW (ref >60)
Glucose, Bld: 121 mg/dL — ABNORMAL HIGH (ref 70–99)
Potassium: 4.3 mmol/L (ref 3.5–5.1)
Sodium: 127 mmol/L — ABNORMAL LOW (ref 135–145)

## 2021-06-11 LAB — CBC
HCT: 27.8 % — ABNORMAL LOW (ref 36.0–46.0)
Hemoglobin: 8.8 g/dL — ABNORMAL LOW (ref 12.0–15.0)
MCH: 27 pg (ref 26.0–34.0)
MCHC: 31.7 g/dL (ref 30.0–36.0)
MCV: 85.3 fL (ref 80.0–100.0)
Platelets: 200 10*3/uL (ref 150–400)
RBC: 3.26 MIL/uL — ABNORMAL LOW (ref 3.87–5.11)
RDW: 18.5 % — ABNORMAL HIGH (ref 11.5–15.5)
WBC: 7.6 10*3/uL (ref 4.0–10.5)
nRBC: 0 % (ref 0.0–0.2)

## 2021-06-11 LAB — GLUCOSE, CAPILLARY: Glucose-Capillary: 140 mg/dL — ABNORMAL HIGH (ref 70–99)

## 2021-06-11 MED ORDER — OXYCODONE HCL 5 MG PO TABS: 5.0000 mg | ORAL_TABLET | Freq: Four times a day (QID) | ORAL | 0 refills | Status: AC | PRN

## 2021-06-11 MED ORDER — HYDROMORPHONE HCL 1 MG/ML IJ SOLN
0.5000 mg | INTRAMUSCULAR | Status: DC | PRN
  Administered 2021-06-11 – 2021-06-12 (×8): 0.5 mg via INTRAVENOUS
  Filled 2021-06-11 (×8): qty 0.5

## 2021-06-11 NOTE — Progress Notes (Signed)
Patient is resting in bed with eyes closed breathing is even and nonlabored. No sign of pain or discomfort at this time. Will continue to monitor throughout shift.

## 2021-06-11 NOTE — TOC Transition Note (Signed)
Transition of Care [facility]) - CM/SW Discharge Note   Patient Details  Name: Crystal [name] MRN: [id number] Date of Birth: 1951-06-04  Transition of Care [facility]) CM/SW Contact:  Natasha Bence, LCSW Phone Number: 06/11/2021, 12:34 PM   Clinical Narrative:    CSW notified of patient's readiness for discharge. CSW notified Judson Roch with [facility]. [name] agreeable to provide [facility]. services upon discharge. CSW called EMS and completed med necessity. TOC signing off.    Final next level of care: [place] Barriers to Discharge: Barriers Resolved   Patient Goals and CMS Choice Patient states their goals for this hospitalization and ongoing recovery are:: return home with [facility] CMS Medicare.gov Compare Post Acute Care list provided to:: Patient Choice offered to / list presented to : Patient  Discharge Placement                Patient to be transferred to facility by: [facility]   Patient and family notified of of transfer: 06/11/21  Discharge Plan and Services In-house Referral: Clinical Social Work Discharge Planning Services: CM Consult Post Acute Care Choice: Home Health                    HH Arranged: RN, PT [facility] Agency: [place] Date [place]: 06/11/21 Time [place]: 1234 Representative spoke with at [place]: [place] Determinants of Health ([place]) Interventions     Readmission Risk Interventions Readmission Risk Prevention Plan 04/28/2021 04/07/2021  Transportation Screening Complete Complete  Medication Review Press photographer) Complete Complete  PCP or Specialist appointment within 3-5 days of discharge - Complete  HRI or Home Care Consult Complete Complete  SW Recovery Care/Counseling Consult - Complete  Palliative Care Screening Not Applicable Not [place] Not Applicable Not Applicable  Some recent data might be hidden

## 2021-06-11 NOTE — Discharge Summary (Signed)
Physician Discharge Summary  Crystal [name] NOB:[id number] DOB: January 07, 1951 DOA: 06/09/2021  PCP: Caprice Renshaw, MD  Admit date: 06/09/2021 Discharge date: 06/11/2021  Admitted From: SNF Disposition:  Home--pt refuses return back to SNF  Recommendations for Outpatient Follow-up:  Follow up with PCP in 1-2 weeks Please obtain BMP/CBC in one week   Home Health: HHPT Equipment/Devices:Hoyer Lift  Discharge Condition: Stable CODE STATUS:FULL Diet recommendation: Heart Healthy   Brief/Interim Summary: 70 year old female with a history of NASH liver cirrhosis, esophageal varices status post TIPS procedure, diastolic CHF, DVTs in the lower extremities, upper GI bleed, coronary artery disease, Raynaud's phenomenon presenting with 3 days of abdominal pain primarily in the left upper quadrant area.  The patient denies any fevers, chills, shortness of breath, nausea, vomiting, hematemesis, hematochezia, melena.  She has chronic loose stools secondary to her lactulose.  The patient has also been complaining of chest discomfort, but states that she has not had any shortness of breath, palpitations, dizziness.  There is no dysuria, hematuria.  The patient has had a rather complicated history with many recent hospitalizations as detailed below. Recent hospitalization 7/11-7/22 for distal nondisplaced right femur fracture, patient was managed nonoperatively due to significant perioperative risk.  Also treated for hepatic encephalopathy with lactulose enemas, urinary tract infection with IV ceftriaxone transition to oral Augmentin.   Hospitalization 6/15- 05/01/21 for anemia due to GI blood loss, Hgb was 5.9 required 2 units of blood, also treated with IV pantoprazole and octreotide.  EGD 6/16 showed single nonbleeding angiectasia in the stomach, severe diffuse antral vascular ectasia both treated with argon plasma coagulation.  Was also treated for aspiration pneumonia during the hospitalization.   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  Repeat Ultrasound 05/19/2021 demonstrated subacute DVT, and acute occlusive superficial thrombophlebitis of the right small saphenous vein. Patient was started on anticoagulation with Eliquis which she took for just a week before she presented to the ED 04/2021 with evidence of GI blood loss.  Anticoagulation was discontinued.  An IVC filter was placed by vascular surgeon Dr. Lucky Cowboy 6/16. ED Course: Stable vitals.  Sodium 127.  Creatinine stable 1.65.  Hgb 9.7.  Platelets 217.  Abdominal CT showed new splenic infarct, patent TIPS, partially visualized moderate loculated right pleural effusion. EDP talked to GI, who will see the patient in the hospital, also recommended talking to general surgery.    Discharge Diagnoses:  Splenic infarct -06/09/2021 CT abd/pelvis-- new wedge-shaped peripheral area of hypoattenuation seen in the spleen compatible with splenic infarct. -Very difficult situation given the patient's hx of GAVE, UGIB ambulating previously on apixaban -Patient also has recent history of DVTs in the legs -GI consult appreciated-->due to high risk of bleed>>avoid AC, but ok for ASA 81 mg daily -Patient likely has underlying hypercoagulable state although unclear if this is acquired or hereditary -Check lupus anticoagulant, prothrombin gene mutation, factor V Leiden -Start aspirin 81 mg daily if AC is not an option -7/29 Echo--no thrombi   Subacute DVT right popliteal vein -05/28/2021 subacute DVT right popliteal vein, acute superficial thrombophlebitis right small saphenous vein, remote DVT left common femoral vein -Status post IVC filter 04/28/2021, Dr. Lucky Cowboy   GAVE/esophageal varices/history of GI bleeding -GI consult as discussed -Continue Protonix -Baseline hemoglobin 9-10   Closed fracture of the right lower extremity -managed nonoperatively due to high perioperative risk. -Continue  nonweightbearing, brace to right knee -PT eval>>SNF -discussed with patient--she refuses  to return to SNF -HHPT and hoyer lift set up for home d/c -pt needs to follow up with ortho--Dr. Renee Harder     NASH Liver cirrhosis/esophageal varices/hepatic encephalopathy -stable.   -Reports compliance with lactulose -Resume home lactulose -Continue Lasix and spironolactone   Chronic diastolic CHF -11/18/1094 echo EF 55 to 60%, no WMA, G1DD -Appears clinically euvolemic -Resume Lasix 40 mg daily, metolazone 5 mg daily   Controlled Diabetes mellitus-diet controlled. Hgba1c 5.6 - Daily fasting CBGs   CKD 4 -baseline creatinine 1.6-1.9   Morbid Obesity -lifestyle modification -BMI 35.12   Discharge Instructions   Allergies as of 06/11/2021   No Known Allergies      Medication List     STOP taking these medications    amoxicillin-clavulanate 875-125 MG tablet Commonly known as: AUGMENTIN   benzonatate 100 MG capsule Commonly known as: TESSALON       TAKE these medications    acetaminophen 500 MG tablet Commonly known as: TYLENOL Take 1 tablet (500 mg total) by mouth every 6 (six) hours as needed for mild pain, moderate pain, fever or headache.   furosemide 80 MG tablet Commonly known as: LASIX Take 0.5 tablets (40 mg total) by mouth daily.   gabapentin 100 MG capsule Commonly known as: NEURONTIN Take 100 mg by mouth daily. For 3 days   hydrOXYzine 25 MG tablet Commonly known as: ATARAX/VISTARIL Take 1 tablet (25 mg total) by mouth 2 (two) times daily as needed. What changed: reasons to take this   lactulose 10 GM/15ML solution Commonly known as: CHRONULAC Take 30 mLs (20 g total) by mouth 3 (three) times daily.   metolazone 5 MG tablet Commonly known as: ZAROXOLYN Take 1 tablet (5 mg total) by mouth every other day. What changed: when to take this   ondansetron 4 MG tablet Commonly known as: ZOFRAN Take 4 mg by mouth every 8 (eight) hours as  needed for nausea or vomiting.   oxyCODONE 5 MG immediate release tablet Commonly known as: Oxy IR/ROXICODONE Take 1 tablet (5 mg total) by mouth every 6 (six) hours as needed for up to 7 days for moderate pain or severe pain.   pantoprazole 40 MG tablet Commonly known as: PROTONIX Take 1 tablet (40 mg total) by mouth 2 (two) times daily.   potassium chloride SA 20 MEQ tablet Commonly known as: KLOR-CON Take 1 tablet (20 mEq total) by mouth daily.   spironolactone 50 MG tablet Commonly known as: ALDACTONE Take 1 tablet (50 mg total) by mouth daily.   Vitamin D (Ergocalciferol) 1.25 MG (50000 UNIT) Caps capsule Commonly known as: DRISDOL Take 50,000 Units by mouth every Sunday.               Durable Medical Equipment  (From admission, onward)           Start     Ordered   06/10/21 1615  For home use only DME Other see comment  Once       Question:  Length of Need  Answer:  Lifetime   06/10/21 1615            No Known Allergies  Consultations: GI General surgery   Procedures/Studies: CT Head Wo Contrast  Result Date: 05/23/2021 CLINICAL DATA:  Pain after fall EXAM: CT HEAD WITHOUT CONTRAST CT CERVICAL SPINE WITHOUT CONTRAST TECHNIQUE: Multidetector CT imaging of the head and cervical spine was performed following the standard protocol without intravenous contrast. Multiplanar CT image reconstructions of the cervical spine  were also generated. COMPARISON:  Apr 04, 2021 FINDINGS: CT HEAD FINDINGS Brain: Similar mild age related global parenchymal volume loss. Stable mild burden of chronic ischemic white matter microvascular disease. No evidence of acute large vascular territory infarction, hemorrhage, hydrocephalus, extra-axial collection or mass lesion/mass effect. Vascular: No hyperdense vessel. Atherosclerotic calcifications of the internal carotid and vertebral arteries at the skull base. Skull: Normal. Negative for fracture or focal lesion. Sinuses/Orbits:  Visualized portions of the paranasal sinuses and mastoid air cells are predominantly clear. Orbits are grossly unremarkable. Other: None CT CERVICAL SPINE FINDINGS Alignment: Mild loss of the normal cervical lordosis. No evidence of traumatic listhesis. 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 No acute fracture. Soft tissues and spinal canal: No prevertebral fluid or swelling. No visible canal hematoma. Disc levels: Moderate multilevel discogenic disease most notable in the lower cervical spine. Upper chest: No acute abnormality. Other: Calcifications in the bilateral parotid glands. Carotid artery calcifications. Partially visualized right chest wall Port-A-Cath. IMPRESSION: 1. No evidence of acute intracranial process. 2. No evidence of acute fracture or traumatic listhesis of the cervical spine. 3. 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 4. 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 Electronically Signed   By: Dahlia Bailiff MD   On: 05/23/2021 17:53   CT Cervical Spine Wo Contrast  Result Date: 05/23/2021 CLINICAL DATA:  Pain after fall EXAM: CT HEAD WITHOUT CONTRAST CT CERVICAL SPINE WITHOUT CONTRAST TECHNIQUE: Multidetector CT imaging of the head and cervical spine was performed following the standard protocol without intravenous contrast. Multiplanar CT image reconstructions of the cervical spine were also generated. COMPARISON:  Apr 04, 2021 FINDINGS: CT HEAD FINDINGS Brain: Similar mild age related global parenchymal volume loss. Stable mild burden of chronic ischemic white matter microvascular disease. No evidence of acute large vascular territory infarction, hemorrhage, hydrocephalus, extra-axial collection or mass lesion/mass effect. Vascular: No hyperdense vessel. Atherosclerotic  calcifications of the internal carotid and vertebral arteries at the skull base. Skull: Normal. Negative for fracture or focal lesion. Sinuses/Orbits: Visualized portions of the paranasal sinuses and mastoid air cells are predominantly clear. Orbits are grossly unremarkable. Other: None CT CERVICAL SPINE FINDINGS Alignment: Mild loss of the normal cervical lordosis. No evidence of traumatic listhesis. 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 No acute fracture. Soft tissues and spinal canal: No prevertebral fluid or swelling. No visible canal hematoma. Disc levels: Moderate multilevel discogenic disease most notable in the lower cervical spine. Upper chest: No acute abnormality. Other: Calcifications in the bilateral parotid glands. Carotid artery calcifications. Partially visualized right chest wall Port-A-Cath. IMPRESSION: 1. No evidence of acute intracranial process. 2. No evidence of acute fracture or traumatic listhesis of the cervical spine. 3. 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 4. 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 Electronically Signed   By: Dahlia Bailiff MD   On: 05/23/2021 17:53   CT Knee Right Wo Contrast  Result Date: 05/23/2021 CLINICAL DATA:  Fall EXAM: CT OF THE RIGHT KNEE WITHOUT CONTRAST TECHNIQUE: Multidetector CT imaging of the right knee was performed according to the standard protocol. Multiplanar CT image reconstructions were also generated. COMPARISON:  None. FINDINGS: There is a  nondisplaced fracture of the distal right femur with fracture lines extending to the supracondylar lateral femoral surface. Fracture line of the middle portion of the distal femoral shaft extends to intercondylar surface. Fracture lines best delineated on series 4 and series 101. Right  suprapatellar hemarthrosis. IMPRESSION: 1. Nondisplaced fracture of the distal right femur with fracture lines extending to the supracondylar lateral femoral cortex and intercondylar surface. 2. Right suprapatellar hemarthrosis. Electronically Signed   By: Ulyses Jarred M.D.   On: 05/23/2021 21:23   MR BRAIN WO CONTRAST  Result Date: 05/27/2021 CLINICAL DATA:  Mental status change, unknown cause. EXAM: MRI HEAD WITHOUT CONTRAST TECHNIQUE: Multiplanar, multiecho pulse sequences of the brain and surrounding structures were obtained without intravenous contrast. COMPARISON:  Head CT May 23, 2021. FINDINGS: Brain: No acute infarction, hemorrhage, hydrocephalus, extra-axial collection or mass lesion. A few foci of T2 hyperintensity are seen within the white matter of the cerebral hemispheres, nonspecific. Vascular: Normal flow voids. Skull and upper cervical spine: Normal marrow signal. Sinuses/Orbits: Negative. Other: None. IMPRESSION: 1. No acute intracranial abnormality. 2. Small amount of nonspecific T2 hyperintense lesions of the white matter, may represent early chronic microangiopathy. Electronically Signed   By: Pedro Earls M.D.   On: 05/27/2021 13:10   CT ABDOMEN PELVIS W CONTRAST  Result Date: 06/09/2021 CLINICAL DATA:  Abdominal pain EXAM: CT ABDOMEN AND PELVIS WITH CONTRAST TECHNIQUE: Multidetector CT imaging of the abdomen and pelvis was performed using the standard protocol following bolus administration of intravenous contrast. CONTRAST:  27m OMNIPAQUE IOHEXOL 300 MG/ML  SOLN COMPARISON:  CT abdomen and pelvis dated August 05, 2018 FINDINGS: Lower chest: Moderate loculated right pleural effusion with atelectasis. Trace left pleural effusion. Cardiomegaly with coronary artery calcifications. No pericardial effusion. Hepatobiliary: No suspicious focal liver lesions tips shunt is in place and appears patent. Cholelithiasis no evidence of acute cholecystitis. No biliary ductal  dilation. Pancreas: Embolization coils noted near the pancreatic head. Pancreas is unremarkable. Spleen: Splenomegaly. Wedge-shaped peripheral area of hypoattenuation seen in the spleen compatible with splenic infarct. Adrenals/Urinary Tract: Bilateral adrenal glands are unremarkable. Bilateral kidneys are normal in appearance. Normal bladder. Stomach/Bowel: Bowel is normal in caliber with no evidence of obstruction or wall thickening. Normal appendix. Normal appearance of the stomach. Vascular/Lymphatic: Aortic atherosclerotic disease no pathologically enlarged nodes seen in the abdomen or pelvis. IVC filter noted in the infrarenal IVC. Reproductive: Uterus is present. Cystic lesion the left adnexa, measuring up to 4.1 cm, was previously seen on prior CT when it measured to 6.5 cm. Other: No abdominal ascites.  Now free intraperitoneal air. Soft tissue anasarca Musculoskeletal: Grade 1 anterolisthesis of L4 on L5. No acute osseous abnormalities. IMPRESSION: New splenic infarct. Patent TIPS. Partially visualized moderate loculated right pleural effusion. Electronically Signed   By: LYetta GlassmanMD   On: 06/09/2021 14:39   CARDIAC CATHETERIZATION  Result Date: 05/20/2021 Conclusions: 1. Normal left heart and pulmonary artery pressures. 2. Mildly elevated right heart filling pressures. 3. Normal cardiac output/index. Recommendations: 1. Chronic lower extremity edema does not appear to be driven primarily by heart failure. 2. Continue current doses of furosemide and spironolactone.  Decrease metolazone to 5 mg every other day to lessen risk for worsening renal insufficiency. 3. Further management per hepatology and nephrology, as chronic edema favored to be due to chronic liver and kidney disease. CNelva Bush MD [facility]   [place]NAL  Result Date: 05/27/2021 CLINICAL DATA:  Acute kidney injury EXAM: RENAL / URINARY TRACT ULTRASOUND COMPLETE  COMPARISON:  None. FINDINGS: Right Kidney: Renal  measurements: 10.5 x 4.4 x 3.7 cm = volume: 90.5 mL. Echogenicity within normal limits. No mass or hydronephrosis visualized. Left Kidney: Renal measurements: 10.3 x 4.0 x 4.7 cm = volume: 100 mL. Echogenicity within normal limits. No mass or hydronephrosis visualized. Bladder: Appears normal for degree of bladder distention. Other: None. IMPRESSION: Negative renal ultrasound.  No hydronephrosis. Electronically Signed   By: Julian Hy M.D.   On: 05/27/2021 19:13   US Abdomen Limited  Result Date: 05/28/2021 CLINICAL DATA:  Abdominal distension, possible ascites EXAM: LIMITED ABDOMEN ULTRASOUND FOR ASCITES TECHNIQUE: Limited ultrasound survey for ascites was performed in all four abdominal quadrants. COMPARISON:  None. FINDINGS: Ultrasound interrogation of the 4 quadrants of the abdomen demonstrates no evidence of ascites. IMPRESSION: Negative for ascites. Electronically Signed   By: Jacqulynn Cadet M.D.   On: 05/28/2021 14:40   US Venous Img Lower Bilateral (DVT)  Result Date: 05/28/2021 CLINICAL DATA:  Bilateral lower extremity edema EXAM: BILATERAL LOWER EXTREMITY VENOUS DOPPLER ULTRASOUND TECHNIQUE: Gray-scale sonography with graded compression, as well as color Doppler and duplex ultrasound were performed to evaluate the lower extremity deep venous systems from the level of the common femoral vein and including the common femoral, femoral, profunda femoral, popliteal and calf veins including the posterior tibial, peroneal and gastrocnemius veins when visible. The superficial great saphenous vein was also interrogated. Spectral Doppler was utilized to evaluate flow at rest and with distal augmentation maneuvers in the common femoral, femoral and popliteal veins. COMPARISON:  None. FINDINGS: RIGHT LOWER EXTREMITY Common Femoral Vein: No evidence of thrombus. Normal compressibility, respiratory phasicity and response to augmentation. Saphenofemoral Junction: No evidence of thrombus. Normal  compressibility and flow on color Doppler imaging. Profunda Femoral Vein: No evidence of thrombus. Normal compressibility and flow on color Doppler imaging. Femoral Vein: No evidence of thrombus. Normal compressibility, respiratory phasicity and response to augmentation. Popliteal Vein: Eccentric wall thickening in the right popliteal vein consistent with wall adherent thrombus. The vessel is partially compressible. There is evidence of color flow in the patent lumen on color Doppler imaging. Calf Veins: No evidence of thrombus. Normal compressibility and flow on color Doppler imaging. Superficial Great Saphenous Vein: No evidence of thrombus. Normal compressibility. Venous Reflux:  None. Other Findings: Occlusive thrombus extends into the right small saphenous vein. LEFT LOWER EXTREMITY Common Femoral Vein: No acute thrombus. The vessel is patent and compressible. There may be some trace eccentric wall thickening at the saphenofemoral junction consistent with sequelae of remote and recanalized DVT. Saphenofemoral Junction: No evidence of thrombus. Normal compressibility and flow on color Doppler imaging. Profunda Femoral Vein: No evidence of thrombus. Normal compressibility and flow on color Doppler imaging. Femoral Vein: No evidence of thrombus. Normal compressibility, respiratory phasicity and response to augmentation. Popliteal Vein: No evidence of thrombus. Normal compressibility, respiratory phasicity and response to augmentation. Calf Veins: No evidence of thrombus. Normal compressibility and flow on color Doppler imaging. Superficial Great Saphenous Vein: No evidence of thrombus. Normal compressibility. Venous Reflux:  None. Other Findings:  None. IMPRESSION: 1. Subacute and partially recanalized nonocclusive DVT in the right popliteal vein. 2. Acute occlusive superficial thrombophlebitis involving the right small saphenous vein. 3. Minimal sequelae of remote prior DVT on the left at the saphenofemoral  junction. Electronically Signed   By: Jacqulynn Cadet M.D.   On: 05/28/2021 14:39   DG Chest Port 1 View  Result Date: 05/28/2021 CLINICAL DATA:  Possible sepsis56 y.o. F with CKD IV  baseline Cr 1.9, NASH cirrhosis s/p TIPS, dCHF, DM, and hx GIB who presented with mechanical fall and knee pain. EXAM: PORTABLE CHEST 1 VIEW COMPARISON:  05/23/2021 and older exams. FINDINGS: Opacity noted at the lung bases, greater on the right, consistent with atelectasis, mildly increased from the previous study. Remainder of the lungs is clear. Cardiac silhouette normal in size. Normal mediastinal and hilar contours. No convincing pleural effusion and no pneumothorax. Stable right internal jugular Port-A-Cath. IMPRESSION: 1. Mild, right greater than left, basilar atelectasis, mildly increased from the prior study. 2. No convincing pneumonia. No evidence of pulmonary edema. No other change. Electronically Signed   By: Lajean Manes M.D.   On: 05/28/2021 17:42   DG Chest Portable 1 View  Result Date: 05/23/2021 CLINICAL DATA:  Fall with blunt chest trauma EXAM: PORTABLE CHEST 1 VIEW COMPARISON:  Radiograph 04/29/2021, CT 02/01/2021 FINDINGS: Chronically coarsened interstitial and bronchitic features. More bandlike areas of opacity in the lung bases likely corresponding to areas of scarring in regions of as well as more nodular opacity in the right mid to lower lung no new consolidative process. No pneumothorax or effusion. No acute traumatic findings of the chest wall. Port-A-Cath projects over the right chest with tip in the mid SVC. Stable cardiomediastinal contours. IMPRESSION: No acute cardiopulmonary or traumatic findings. Ill-defined nodular opacities in the right lung base may reflect regions of masslike opacity seen on comparison CT and radiography. Difficult to fully assess interval change on portable radiographs. Electronically Signed   By: Lovena Le M.D.   On: 05/23/2021 19:16   DG Knee Complete 4 Views  Right  Result Date: 05/23/2021 CLINICAL DATA:  Pain after fall EXAM: RIGHT KNEE - COMPLETE 4+ VIEW COMPARISON:  None. FINDINGS: No evidence of fracture or dislocation. Moderate suprapatellar joint effusion with overlying soft tissue swelling. Patellar enthesophyte. Mild tricompartment degenerative change. Vascular calcifications. IMPRESSION: 1. No acute osseous abnormality. 2. Moderate suprapatellar joint effusion with overlying soft tissue swelling 3. Mild tricompartment degenerative change. Electronically Signed   By: Dahlia Bailiff MD   On: 05/23/2021 17:56   EEG adult  Result Date: 05/30/2021 Greta Doom, MD     05/30/2021  3:00 PM History: 70 yo F with AMS Sedation: None Technique: This is a 21 channel routine scalp EEG performed at the bedside with bipolar and monopolar montages arranged in accordance to the international 10/20 system of electrode placement. One channel was dedicated to EKG recording. Background: There is a well-formed posterior dominant rhythm of 8 Hz which is reactive to eye opening/closure.  In addition there is intrusion into the background of irregular low voltage delta and theta activity even during maximal wakefulness.  With drowsiness, there is prominent frontally predominant intermittent rhythmic delta activity(FIRDA). Photic stimulation: Physiologic driving is not performed EEG Abnormalities: 1) FIRDA 2) Generalized irregular slow activity. Clinical Interpretation: This EEG is consistent with a mild generalized nonspecific cerebral dysfunction (encephalopathy). There was no seizure or seizure predisposition recorded on this study. Please note that lack of epileptiform activity on EEG does not preclude the possibility of epilepsy. Roland Rack, MD [facility] [phone] If 7pm- 7am, please page neurology on call as listed in [place].   ECHOCARDIOGRAM LIMITED  Result Date: 06/10/2021    ECHOCARDIOGRAM LIMITED REPORT   Patient Name:   Crystal [name]  Date of Exam: 06/10/2021 Medical Rec #:  [id number]    Height:       62.0 in Accession #:    [id number]   Weight:  192.0 lb Date of Birth:  01-06-1951    BSA:          1.879 m Patient Age:    38 years     BP:           118/53 mmHg Patient Gender: F            HR:           72 bpm. Exam Location:  [facility] Procedure: Limited Echo Indications:    Splenic Infarct  History:        Patient has prior history of Echocardiogram examinations, most                 recent 12/14/2020. CHF, CAD; Risk Factors:Diabetes.  Sonographer:    Wenda Low Referring Phys: Dwight Mission  1. Left ventricular ejection fraction, by estimation, is 60 to 65%. The left ventricle has normal function. The left ventricle has no regional wall motion abnormalities. There is moderate left ventricular hypertrophy.  2. The aortic valve is tricuspid.  3. The inferior vena cava is normal in size with greater than 50% respiratory variability, suggesting right atrial pressure of 3 mmHg.  4. Limited echo for splenic infarct, evaluate potential thromboembolic source. No evidence of intracardiac course from this limtied echo. FINDINGS  Left Ventricle: Left ventricular ejection fraction, by estimation, is 60 to 65%. The left ventricle has normal function. The left ventricle has no regional wall motion abnormalities. The left ventricular internal cavity size was normal in size. There is  moderate left ventricular hypertrophy. Left Atrium: Left atrial size was normal in size. Right Atrium: Right atrial size was normal in size. Pericardium: There is no evidence of pericardial effusion. Mitral Valve: Mild mitral annular calcification. Aortic Valve: The aortic valve is tricuspid. Aorta: The aortic root is normal in size and structure. Venous: The inferior vena cava is normal in size with greater than 50% respiratory variability, suggesting right atrial pressure of 3 mmHg. LEFT VENTRICLE PLAX 2D LVIDd:         4.41 cm LVIDs:          2.44 cm LV PW:         1.32 cm LV IVS:        1.45 cm LVOT diam:     2.00 cm LVOT Area:     3.14 cm  LEFT ATRIUM         Index LA diam:    4.10 cm 2.18 cm/m   AORTA Ao Root diam: 3.70 cm  SHUNTS Systemic Diam: 2.00 cm Carlyle Dolly MD Electronically signed by Carlyle Dolly MD Signature Date/Time: 06/10/2021/1:01:11 PM    Final         Discharge Exam: Vitals:   06/10/21 1320 06/10/21 2239  BP: (!) 118/57 133/60  Pulse: 68 91  Resp: 18 18  Temp: 99.1 F (37.3 C) 98.7 F (37.1 C)  SpO2: 96% 93%   Vitals:   06/10/21 0417 06/10/21 0744 06/10/21 1320 06/10/21 2239  BP: (!) 133/50 (!) 118/53 (!) 118/57 133/60  Pulse: 88 72 68 91  Resp: _0 Temp: 99.3 F (37.4 C) 98.2 F (36.8 C) 99.1 F (37.3 C) 98.7 F (37.1 C)  TempSrc: Oral Oral Oral Oral  SpO2: 99% 91% 96% 93%  Weight:      Height:        General: Pt is alert, awake, not in acute distress Cardiovascular: RRR, S1/S2 +, no rubs, no gallops Respiratory: bibasilar  rales. No wheeze Abdominal: Soft, NT, ND, bowel sounds + Extremities: no edema, no cyanosis   The results of significant diagnostics from this hospitalization (including imaging, microbiology, ancillary and laboratory) are listed below for reference.    Significant Diagnostic Studies: CT Head Wo Contrast  Result Date: 05/23/2021 CLINICAL DATA:  Pain after fall EXAM: CT HEAD WITHOUT CONTRAST CT CERVICAL SPINE WITHOUT CONTRAST TECHNIQUE: Multidetector CT imaging of the head and cervical spine was performed following the standard protocol without intravenous contrast. Multiplanar CT image reconstructions of the cervical spine were also generated. COMPARISON:  Apr 04, 2021 FINDINGS: CT HEAD FINDINGS Brain: Similar mild age related global parenchymal volume loss. Stable mild burden of chronic ischemic white matter microvascular disease. No evidence of acute large vascular territory infarction, hemorrhage, hydrocephalus, extra-axial collection or mass  lesion/mass effect. Vascular: No hyperdense vessel. Atherosclerotic calcifications of the internal carotid and vertebral arteries at the skull base. Skull: Normal. Negative for fracture or focal lesion. Sinuses/Orbits: Visualized portions of the paranasal sinuses and mastoid air cells are predominantly clear. Orbits are grossly unremarkable. Other: None CT CERVICAL SPINE FINDINGS Alignment: Mild loss of the normal cervical lordosis. No evidence of traumatic listhesis. 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 No acute fracture. Soft tissues and spinal canal: No prevertebral fluid or swelling. No visible canal hematoma. Disc levels: Moderate multilevel discogenic disease most notable in the lower cervical spine. Upper chest: No acute abnormality. Other: Calcifications in the bilateral parotid glands. Carotid artery calcifications. Partially visualized right chest wall Port-A-Cath. IMPRESSION: 1. No evidence of acute intracranial process. 2. No evidence of acute fracture or traumatic listhesis of the cervical spine. 3. 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 4. 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 Electronically Signed   By: Dahlia Bailiff MD   On: 05/23/2021 17:53   CT Cervical Spine Wo Contrast  Result Date: 05/23/2021 CLINICAL DATA:  Pain after fall EXAM: CT HEAD WITHOUT CONTRAST CT CERVICAL SPINE WITHOUT CONTRAST TECHNIQUE: Multidetector CT imaging of the head and cervical spine was performed following the standard protocol without intravenous contrast. Multiplanar CT image reconstructions of the cervical spine were also generated. COMPARISON:  Apr 04, 2021 FINDINGS: CT HEAD FINDINGS Brain: Similar mild age related global parenchymal volume loss. Stable mild burden of chronic ischemic white matter  microvascular disease. No evidence of acute large vascular territory infarction, hemorrhage, hydrocephalus, extra-axial collection or mass lesion/mass effect. Vascular: No hyperdense vessel. Atherosclerotic calcifications of the internal carotid and vertebral arteries at the skull base. Skull: Normal. Negative for fracture or focal lesion. Sinuses/Orbits: Visualized portions of the paranasal sinuses and mastoid air cells are predominantly clear. Orbits are grossly unremarkable. Other: None CT CERVICAL SPINE FINDINGS Alignment: Mild loss of the normal cervical lordosis. No evidence of traumatic listhesis. 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 No acute fracture. Soft tissues and spinal canal: No prevertebral fluid or swelling. No visible canal hematoma. Disc levels: Moderate multilevel discogenic disease most notable in the lower cervical spine. Upper chest: No acute abnormality. Other: Calcifications in the bilateral parotid glands. Carotid artery calcifications. Partially visualized right chest wall Port-A-Cath. IMPRESSION: 1. No evidence of acute intracranial process. 2. No evidence of acute fracture or traumatic listhesis of the cervical spine. 3.  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 4. 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 Electronically Signed   By: Dahlia Bailiff MD   On: 05/23/2021 17:53   CT Knee Right Wo Contrast  Result Date: 05/23/2021 CLINICAL DATA:  Fall EXAM: CT OF THE RIGHT KNEE WITHOUT CONTRAST TECHNIQUE: Multidetector CT imaging of the right knee was performed according to the standard protocol. Multiplanar CT image reconstructions were also generated. COMPARISON:  None. FINDINGS: There is a nondisplaced fracture of the distal right femur with fracture lines extending to the  supracondylar lateral femoral surface. Fracture line of the middle portion of the distal femoral shaft extends to intercondylar surface. Fracture lines best delineated on series 4 and series 101. Right suprapatellar hemarthrosis. IMPRESSION: 1. Nondisplaced fracture of the distal right femur with fracture lines extending to the supracondylar lateral femoral cortex and intercondylar surface. 2. Right suprapatellar hemarthrosis. Electronically Signed   By: Ulyses Jarred M.D.   On: 05/23/2021 21:23   MR BRAIN WO CONTRAST  Result Date: 05/27/2021 CLINICAL DATA:  Mental status change, unknown cause. EXAM: MRI HEAD WITHOUT CONTRAST TECHNIQUE: Multiplanar, multiecho pulse sequences of the brain and surrounding structures were obtained without intravenous contrast. COMPARISON:  Head CT May 23, 2021. FINDINGS: Brain: No acute infarction, hemorrhage, hydrocephalus, extra-axial collection or mass lesion. A few foci of T2 hyperintensity are seen within the white matter of the cerebral hemispheres, nonspecific. Vascular: Normal flow voids. Skull and upper cervical spine: Normal marrow signal. Sinuses/Orbits: Negative. Other: None. IMPRESSION: 1. No acute intracranial abnormality. 2. Small amount of nonspecific T2 hyperintense lesions of the white matter, may represent early chronic microangiopathy. Electronically Signed   By: Pedro Earls M.D.   On: 05/27/2021 13:10   CT ABDOMEN PELVIS W CONTRAST  Result Date: 06/09/2021 CLINICAL DATA:  Abdominal pain EXAM: CT ABDOMEN AND PELVIS WITH CONTRAST TECHNIQUE: Multidetector CT imaging of the abdomen and pelvis was performed using the standard protocol following bolus administration of intravenous contrast. CONTRAST:  11m OMNIPAQUE IOHEXOL 300 MG/ML  SOLN COMPARISON:  CT abdomen and pelvis dated August 05, 2018 FINDINGS: Lower chest: Moderate loculated right pleural effusion with atelectasis. Trace left pleural effusion. Cardiomegaly with coronary artery  calcifications. No pericardial effusion. Hepatobiliary: No suspicious focal liver lesions tips shunt is in place and appears patent. Cholelithiasis no evidence of acute cholecystitis. No biliary ductal dilation. Pancreas: Embolization coils noted near the pancreatic head. Pancreas is unremarkable. Spleen: Splenomegaly. Wedge-shaped peripheral area of hypoattenuation seen in the spleen compatible with splenic infarct. Adrenals/Urinary Tract: Bilateral adrenal glands are unremarkable. Bilateral kidneys are normal in appearance. Normal bladder. Stomach/Bowel: Bowel is normal in caliber with no evidence of obstruction or wall thickening. Normal appendix. Normal appearance of the stomach. Vascular/Lymphatic: Aortic atherosclerotic disease no pathologically enlarged nodes seen in the abdomen or pelvis. IVC filter noted in the infrarenal IVC. Reproductive: Uterus is present. Cystic lesion the left adnexa, measuring up to 4.1 cm, was previously seen on prior CT when it measured to 6.5 cm. Other: No abdominal ascites.  Now free intraperitoneal air. Soft tissue anasarca Musculoskeletal: Grade 1 anterolisthesis of L4 on L5. No acute osseous abnormalities. IMPRESSION: New splenic infarct. Patent TIPS. Partially visualized moderate loculated right pleural effusion. Electronically Signed   By: LYetta GlassmanMD   On: 06/09/2021 14:39   CARDIAC CATHETERIZATION  Result Date: 05/20/2021 Conclusions: 1. Normal left heart and pulmonary artery pressures. 2. Mildly elevated right  heart filling pressures. 3. Normal cardiac output/index. Recommendations: 1. Chronic lower extremity edema does not appear to be driven primarily by heart failure. 2. Continue current doses of furosemide and spironolactone.  Decrease metolazone to 5 mg every other day to lessen risk for worsening renal insufficiency. 3. Further management per hepatology and nephrology, as chronic edema favored to be due to chronic liver and kidney disease. Nelva Bush,  MD [facility]   US RENAL  Result Date: 05/27/2021 CLINICAL DATA:  Acute kidney injury EXAM: RENAL / URINARY TRACT ULTRASOUND COMPLETE COMPARISON:  None. FINDINGS: Right Kidney: Renal measurements: 10.5 x 4.4 x 3.7 cm = volume: 90.5 mL. Echogenicity within normal limits. No mass or hydronephrosis visualized. Left Kidney: Renal measurements: 10.3 x 4.0 x 4.7 cm = volume: 100 mL. Echogenicity within normal limits. No mass or hydronephrosis visualized. Bladder: Appears normal for degree of bladder distention. Other: None. IMPRESSION: Negative renal ultrasound.  No hydronephrosis. Electronically Signed   By: Julian Hy M.D.   On: 05/27/2021 19:13   US Abdomen Limited  Result Date: 05/28/2021 CLINICAL DATA:  Abdominal distension, possible ascites EXAM: LIMITED ABDOMEN ULTRASOUND FOR ASCITES TECHNIQUE: Limited ultrasound survey for ascites was performed in all four abdominal quadrants. COMPARISON:  None. FINDINGS: Ultrasound interrogation of the 4 quadrants of the abdomen demonstrates no evidence of ascites. IMPRESSION: Negative for ascites. Electronically Signed   By: Jacqulynn Cadet M.D.   On: 05/28/2021 14:40   US Venous Img Lower Bilateral (DVT)  Result Date: 05/28/2021 CLINICAL DATA:  Bilateral lower extremity edema EXAM: BILATERAL LOWER EXTREMITY VENOUS DOPPLER ULTRASOUND TECHNIQUE: Gray-scale sonography with graded compression, as well as color Doppler and duplex ultrasound were performed to evaluate the lower extremity deep venous systems from the level of the common femoral vein and including the common femoral, femoral, profunda femoral, popliteal and calf veins including the posterior tibial, peroneal and gastrocnemius veins when visible. The superficial great saphenous vein was also interrogated. Spectral Doppler was utilized to evaluate flow at rest and with distal augmentation maneuvers in the common femoral, femoral and popliteal veins. COMPARISON:  None. FINDINGS: RIGHT LOWER  EXTREMITY Common Femoral Vein: No evidence of thrombus. Normal compressibility, respiratory phasicity and response to augmentation. Saphenofemoral Junction: No evidence of thrombus. Normal compressibility and flow on color Doppler imaging. Profunda Femoral Vein: No evidence of thrombus. Normal compressibility and flow on color Doppler imaging. Femoral Vein: No evidence of thrombus. Normal compressibility, respiratory phasicity and response to augmentation. Popliteal Vein: Eccentric wall thickening in the right popliteal vein consistent with wall adherent thrombus. The vessel is partially compressible. There is evidence of color flow in the patent lumen on color Doppler imaging. Calf Veins: No evidence of thrombus. Normal compressibility and flow on color Doppler imaging. Superficial Great Saphenous Vein: No evidence of thrombus. Normal compressibility. Venous Reflux:  None. Other Findings: Occlusive thrombus extends into the right small saphenous vein. LEFT LOWER EXTREMITY Common Femoral Vein: No acute thrombus. The vessel is patent and compressible. There may be some trace eccentric wall thickening at the saphenofemoral junction consistent with sequelae of remote and recanalized DVT. Saphenofemoral Junction: No evidence of thrombus. Normal compressibility and flow on color Doppler imaging. Profunda Femoral Vein: No evidence of thrombus. Normal compressibility and flow on color Doppler imaging. Femoral Vein: No evidence of thrombus. Normal compressibility, respiratory phasicity and response to augmentation. Popliteal Vein: No evidence of thrombus. Normal compressibility, respiratory phasicity and response to augmentation. Calf Veins: No evidence of thrombus. Normal compressibility and flow on color Doppler  imaging. Superficial Great Saphenous Vein: No evidence of thrombus. Normal compressibility. Venous Reflux:  None. Other Findings:  None. IMPRESSION: 1. Subacute and partially recanalized nonocclusive DVT in the  right popliteal vein. 2. Acute occlusive superficial thrombophlebitis involving the right small saphenous vein. 3. 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 Electronically Signed   By: Jacqulynn Cadet M.D.   On: 05/28/2021 14:39   DG Chest Port 1 View  Result Date: 05/28/2021 CLINICAL DATA:  Possible sepsis24 y.o. 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 EXAM: PORTABLE CHEST 1 VIEW COMPARISON:  05/23/2021 and older exams. FINDINGS: Opacity noted at the lung bases, greater on the right, consistent with atelectasis, mildly increased from the previous study. Remainder of the lungs is clear. Cardiac silhouette normal in size. Normal mediastinal and hilar contours. No convincing pleural effusion and no pneumothorax. Stable right internal jugular Port-A-Cath. IMPRESSION: 1. Mild, right greater than left, basilar atelectasis, mildly increased from the prior study. 2. No convincing pneumonia. No evidence of pulmonary edema. No other change. Electronically Signed   By: Lajean Manes M.D.   On: 05/28/2021 17:42   DG Chest Portable 1 View  Result Date: 05/23/2021 CLINICAL DATA:  Fall with blunt chest trauma EXAM: PORTABLE CHEST 1 VIEW COMPARISON:  Radiograph 04/29/2021, CT 02/01/2021 FINDINGS: Chronically coarsened interstitial and bronchitic features. More bandlike areas of opacity in the lung bases likely corresponding to areas of scarring in regions of as well as more nodular opacity in the right mid to lower lung no new consolidative process. No pneumothorax or effusion. No acute traumatic findings of the chest wall. Port-A-Cath projects over the right chest with tip in the mid SVC. Stable cardiomediastinal contours. IMPRESSION: No acute cardiopulmonary or traumatic findings. Ill-defined nodular opacities in the right lung base may reflect regions of masslike opacity seen on comparison CT  and radiography. Difficult to fully assess interval change on portable radiographs. Electronically Signed   By: Lovena Le M.D.   On: 05/23/2021 19:16   DG Knee Complete 4 Views Right  Result Date: 05/23/2021 CLINICAL DATA:  Pain after fall EXAM: RIGHT KNEE - COMPLETE 4+ VIEW COMPARISON:  None. FINDINGS: No evidence of fracture or dislocation. Moderate suprapatellar joint effusion with overlying soft tissue swelling. Patellar enthesophyte. Mild tricompartment degenerative change. Vascular calcifications. IMPRESSION: 1. No acute osseous abnormality. 2. Moderate suprapatellar joint effusion with overlying soft tissue swelling 3. Mild tricompartment degenerative change. Electronically Signed   By: Dahlia Bailiff MD   On: 05/23/2021 17:56   EEG adult  Result Date: 05/30/2021 Greta Doom, MD     05/30/2021  3:00 PM History: 70 yo F with AMS Sedation: None Technique: This is a 21 channel routine scalp EEG performed at the bedside with bipolar and monopolar montages arranged in accordance to the international 10/20 system of electrode placement. One channel was dedicated to EKG recording. Background: There is a well-formed posterior dominant rhythm of 8 Hz which is reactive to eye opening/closure.  In addition there is intrusion into the background of irregular low voltage delta and theta activity even during maximal wakefulness.  With drowsiness, there is prominent frontally predominant intermittent rhythmic delta activity(FIRDA). Photic stimulation: Physiologic driving is not performed EEG Abnormalities: 1) FIRDA 2) Generalized irregular slow activity. Clinical Interpretation: This EEG is consistent with a mild generalized nonspecific cerebral dysfunction (encephalopathy). There was no seizure or seizure  predisposition recorded on this study. Please note that lack of epileptiform activity on EEG does not preclude the possibility of epilepsy. Roland Rack, MD [facility] [phone]  If 7pm- 7am, please page neurology on call as listed in [place].   ECHOCARDIOGRAM LIMITED  Result Date: 06/10/2021    ECHOCARDIOGRAM LIMITED REPORT   Patient Name:   Crystal [name] Date of Exam: 06/10/2021 Medical Rec #:  [id number]    Height:       62.0 in Accession #:    [id number]   Weight:       192.0 lb Date of Birth:  03/05/1951    BSA:          1.879 m Patient Age:    19 years     BP:           118/53 mmHg Patient Gender: F            HR:           72 bpm. Exam Location:  [facility] Procedure: Limited Echo Indications:    Splenic Infarct  History:        Patient has prior history of Echocardiogram examinations, most                 recent 12/14/2020. CHF, CAD; Risk Factors:Diabetes.  Sonographer:    Wenda Low Referring Phys: [place]  1. Left ventricular ejection fraction, by estimation, is 60 to 65%. The left ventricle has normal function. The left ventricle has no regional wall motion abnormalities. There is moderate left ventricular hypertrophy.  2. The aortic valve is tricuspid.  3. The inferior vena cava is normal in size with greater than 50% respiratory variability, suggesting right atrial pressure of 3 mmHg.  4. Limited echo for splenic infarct, evaluate potential thromboembolic source. No evidence of intracardiac course from this limtied echo. FINDINGS  Left Ventricle: Left ventricular ejection fraction, by estimation, is 60 to 65%. The left ventricle has normal function. The left ventricle has no regional wall motion abnormalities. The left ventricular internal cavity size was normal in size. There is  moderate left ventricular hypertrophy. Left Atrium: Left atrial size was normal in size. Right Atrium: Right atrial size was normal in size. Pericardium: There is no evidence of pericardial effusion. Mitral Valve: Mild mitral annular calcification. Aortic Valve: The aortic valve is tricuspid. Aorta: The aortic root is normal in size and structure. Venous: The inferior  vena cava is normal in size with greater than 50% respiratory variability, suggesting right atrial pressure of 3 mmHg. LEFT VENTRICLE PLAX 2D LVIDd:         4.41 cm LVIDs:         2.44 cm LV PW:         1.32 cm LV IVS:        1.45 cm LVOT diam:     2.00 cm LVOT Area:     3.14 cm  LEFT ATRIUM         Index LA diam:    4.10 cm 2.18 cm/m   AORTA Ao Root diam: 3.70 cm  SHUNTS Systemic Diam: 2.00 cm Carlyle Dolly MD Electronically signed by Carlyle Dolly MD Signature Date/Time: 06/10/2021/1:01:11 PM    Final     Microbiology: Recent Results (from the past 240 hour(s))  SARS CORONAVIRUS 2 (Waylon Hershey 6-24 HRS) Nasopharyngeal Nasopharyngeal Swab     Status: None   Collection Time: 06/02/21 11:10 AM   Specimen: Nasopharyngeal Swab  Result Value  Ref Range Status   SARS Coronavirus 2 NEGATIVE NEGATIVE Final    Comment: (NOTE) SARS-CoV-2 target nucleic acids are NOT DETECTED.  The SARS-CoV-2 RNA is generally detectable in upper and lower respiratory specimens during the acute phase of infection. Negative results do not preclude SARS-CoV-2 infection, do not rule out co-infections with other pathogens, and should not be used as the sole basis for treatment or other patient management decisions. Negative results must be combined with clinical observations, patient history, and epidemiological information. The expected result is Negative.  Fact Sheet for Patients: SugarRoll.be  Fact Sheet for Healthcare Providers: https://www.woods-mathews.com/  This test is not yet approved or cleared by the [place] FDA and  has been authorized for detection and/or diagnosis of SARS-CoV-2 by FDA under an Emergency Use Authorization (EUA). This EUA will remain  in effect (meaning this test can be used) for the duration of the COVID-19 declaration under Se ction 564(b)(1) of the Act, 21 U.S.C. section 360bbb-3(b)(1), unless the authorization is terminated or revoked  sooner.  Performed at [facility], [place] [address] [place], [place] [address]   Resp Panel by RT-PCR (Flu A&B, Covid) Nasopharyngeal Swab     Status: None   Collection Time: 06/09/21  7:25 PM   Specimen: Nasopharyngeal Swab; Nasopharyngeal(NP) swabs in vial transport medium  Result Value Ref Range Status   SARS Coronavirus 2 by RT PCR NEGATIVE NEGATIVE Final    Comment: (NOTE) SARS-CoV-2 target nucleic acids are NOT DETECTED.  The SARS-CoV-2 RNA is generally detectable in upper respiratory specimens during the acute phase of infection. The lowest concentration of SARS-CoV-2 viral copies this assay can detect is 138 copies/mL. A negative result does not preclude SARS-Cov-2 infection and should not be used as the sole basis for treatment or other patient management decisions. A negative result may occur with  improper specimen collection/handling, submission of specimen other than nasopharyngeal swab, presence of viral mutation(s) within the areas targeted by this assay, and inadequate number of viral copies(<138 copies/mL). A negative result must be combined with clinical observations, patient history, and epidemiological information. The expected result is Negative.  Fact Sheet for Patients:  EntrepreneurPulse.com.au  Fact Sheet for Healthcare Providers:  IncredibleEmployment.be  This test is no t yet approved or cleared by the [place] FDA and  has been authorized for detection and/or diagnosis of SARS-CoV-2 by FDA under an Emergency Use Authorization (EUA). This EUA will remain  in effect (meaning this test can be used) for the duration of the COVID-19 declaration under Section 564(b)(1) of the Act, 21 U.S.C.section 360bbb-3(b)(1), unless the authorization is terminated  or revoked sooner.       Influenza A by PCR NEGATIVE NEGATIVE Final   Influenza B by PCR NEGATIVE NEGATIVE Final    Comment: (NOTE) The Xpert Xpress  SARS-CoV-2/FLU/RSV plus assay is intended as an aid in the diagnosis of influenza from Nasopharyngeal swab specimens and should not be used as a sole basis for treatment. Nasal washings and aspirates are unacceptable for Xpert Xpress SARS-CoV-2/FLU/RSV testing.  Fact Sheet for Patients: EntrepreneurPulse.com.au  Fact Sheet for Healthcare Providers: IncredibleEmployment.be  This test is not yet approved or cleared by the [place] FDA and has been authorized for detection and/or diagnosis of SARS-CoV-2 by FDA under an Emergency Use Authorization (EUA). This EUA will remain in effect (meaning this test can be used) for the duration of the COVID-19 declaration under Section 564(b)(1) of the Act, 21 U.S.C. section 360bbb-3(b)(1), unless the authorization is  terminated or revoked.  Performed at [facility], [address] [place], [place] [address]      Labs: Basic Metabolic Panel: Recent Labs  Lab 06/09/21 1057 06/10/21 0700 06/11/21 0512  NA 127* 129* 127*  K 4.3 4.3 4.3  CL 96* 99 97*  CO2 _0 GLUCOSE 181* 138* 121*  BUN 48* 42* 39*  CREATININE 1.65* 1.53* 1.50*  CALCIUM 7.8* 7.9* 7.8*   Liver Function Tests: Recent Labs  Lab 06/09/21 1057  AST 27  ALT 16  ALKPHOS 165*  BILITOT 1.2  PROT 5.7*  ALBUMIN 1.7*   Recent Labs  Lab 06/09/21 1057  LIPASE 69*   No results for input(s): AMMONIA in the last 168 hours. CBC: Recent Labs  Lab 06/09/21 1057 06/10/21 0700 06/11/21 0512  WBC 8.3 7.7 7.6  NEUTROABS 6.7  --   --   HGB 9.7* 9.8* 8.8*  HCT 30.7* 31.7* 27.8*  MCV 86.0 85.9 85.3  PLT 217 216 200   Cardiac Enzymes: No results for input(s): CKTOTAL, CKMB, CKMBINDEX, TROPONINI in the last 168 hours. BNP: Invalid input(s): POCBNP CBG: Recent Labs  Lab 06/09/21 0927 06/11/21 0651  GLUCAP 117* 140*    Time coordinating discharge:  36 minutes  Signed:  Orson Eva, DO [facility] Pager:  [phone] 06/11/2021, 10:58 AM

## 2021-06-12 DIAGNOSIS — I5032 Chronic diastolic (congestive) heart failure: Secondary | ICD-10-CM | POA: Diagnosis not present

## 2021-06-12 DIAGNOSIS — I499 Cardiac arrhythmia, unspecified: Secondary | ICD-10-CM | POA: Diagnosis not present

## 2021-06-12 DIAGNOSIS — D735 Infarction of spleen: Secondary | ICD-10-CM | POA: Diagnosis not present

## 2021-06-12 DIAGNOSIS — N184 Chronic kidney disease, stage 4 (severe): Secondary | ICD-10-CM | POA: Diagnosis not present

## 2021-06-12 DIAGNOSIS — Z743 Need for continuous supervision: Secondary | ICD-10-CM | POA: Diagnosis not present

## 2021-06-12 DIAGNOSIS — R0902 Hypoxemia: Secondary | ICD-10-CM | POA: Diagnosis not present

## 2021-06-12 LAB — LUPUS ANTICOAGULANT PANEL
DRVVT: 65.8 s — ABNORMAL HIGH (ref 0.0–47.0)
PTT Lupus Anticoagulant: 46.4 s (ref 0.0–51.9)

## 2021-06-12 LAB — DRVVT CONFIRM: dRVVT Confirm: 1.4 ratio — ABNORMAL HIGH (ref 0.8–1.2)

## 2021-06-12 LAB — GLUCOSE, CAPILLARY: Glucose-Capillary: 148 mg/dL — ABNORMAL HIGH (ref 70–99)

## 2021-06-12 LAB — DRVVT MIX: dRVVT Mix: 47.1 s — ABNORMAL HIGH (ref 0.0–40.4)

## 2021-06-12 MED ORDER — ASPIRIN EC 81 MG PO TBEC
81.0000 mg | DELAYED_RELEASE_TABLET | Freq: Every day | ORAL | Status: DC
  Administered 2021-06-12: 81 mg via ORAL
  Filled 2021-06-12: qty 1

## 2021-06-12 MED ORDER — FUROSEMIDE 40 MG PO TABS: 40.0000 mg | ORAL_TABLET | Freq: Every day | ORAL | Status: DC

## 2021-06-12 MED ORDER — FUROSEMIDE 10 MG/ML IJ SOLN
40.0000 mg | Freq: Once | INTRAMUSCULAR | Status: AC
  Administered 2021-06-12: 40 mg via INTRAVENOUS
  Filled 2021-06-12: qty 4

## 2021-06-12 MED ORDER — ASPIRIN 81 MG PO TBEC: 81.0000 mg | DELAYED_RELEASE_TABLET | Freq: Every day | ORAL | 11 refills | Status: DC

## 2021-06-12 NOTE — Discharge Summary (Addendum)
Physician Discharge Summary  Raye Wiens Roehrs PZW:[id number] DOB: 10-09-1951 DOA: 06/09/2021  PCP: Caprice Renshaw, MD  Admit date: 06/09/2021 Discharge date: 06/12/2021  Admitted From: SNF Disposition:  Home--refuses SNF return  Recommendations for Outpatient Follow-up:  Follow up with PCP in 1-2 weeks Please obtain BMP/CBC in one week Please follow up on the following pending results: factor V leiden, prothrombin gene mutation, lupus anticoagulant  Home Health: YES Equipment/Devices:HHPT, hoyer lift  Discharge Condition: Stable CODE STATUS: FULL Diet recommendation: Heart Healthy / Carb Modified    Brief/Interim Summary: 70 year old female with a history of NASH liver cirrhosis, esophageal varices status post TIPS procedure, diastolic CHF, DVTs in the lower extremities, upper GI bleed, coronary artery disease, Raynaud's phenomenon presenting with 3 days of abdominal pain primarily in the left upper quadrant area.  The patient denies any fevers, chills, shortness of breath, nausea, vomiting, hematemesis, hematochezia, melena.  She has chronic loose stools secondary to her lactulose.  The patient has also been complaining of chest discomfort, but states that she has not had any shortness of breath, palpitations, dizziness.  There is no dysuria, hematuria.  The patient has had a rather complicated history with many recent hospitalizations as detailed below.  Recent hospitalization 7/11-7/22 for distal nondisplaced right femur fracture, patient was managed nonoperatively due to significant perioperative risk.  Also treated for hepatic encephalopathy with lactulose enemas, urinary tract infection with IV ceftriaxone transition to oral Augmentin.   Hospitalization 6/15- 05/01/21 for anemia due to GI blood loss, Hgb was 5.9 required 2 units of blood, also treated with IV pantoprazole and octreotide.  EGD 6/16 showed single nonbleeding angiectasia in the stomach, severe diffuse antral vascular ectasia  both treated with argon plasma coagulation.  Was also treated for aspiration pneumonia during the hospitalization.   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  Repeat Ultrasound 05/19/2021 demonstrated subacute DVT, and acute occlusive superficial thrombophlebitis of the right small saphenous vein. Patient was started on anticoagulation with Eliquis which she took for just a week before she presented to the ED 04/2021 with evidence of GI blood loss.  Anticoagulation was discontinued.  An IVC filter was placed by vascular surgeon Dr. Lucky Cowboy 6/16. ED Course: Stable vitals.  Sodium 127.  Creatinine stable 1.65.  Hgb 9.7.  Platelets 217.  Abdominal CT showed new splenic infarct, patent TIPS, partially visualized moderate loculated right pleural effusion. EDP talked to GI, who will see the patient in the hospital, also recommended talking to general surgery.  GI and general surgery were consulted and both recommended pain management and conservative care regarding her splenic infarct.  She was continued on her lactulose, lasix and spironolactone for her liver cirrhosis.  Her Hgb remained stable during the hospitalization.  PT recommended return to SNF, but patient refused and HHPT was set up.  Her d/c was delayed on 06/11/21 due to lack of ambulance transport.    Discharge Diagnoses:  Splenic infarct -06/09/2021 CT abd/pelvis-- new wedge-shaped peripheral area of hypoattenuation seen in the spleen compatible with splenic infarct. -Very difficult situation given the patient's hx of GAVE, UGIB ambulating previously on apixaban -Patient also has recent history of DVTs in the legs -GI consult appreciated-->due to high risk of bleed>>avoid AC, but ok for ASA 81 mg daily -Patient likely has underlying hypercoagulable state although unclear if this is acquired or hereditary -Check lupus anticoagulant, prothrombin gene mutation, factor V  Leiden--pending at time of d/c-please  follow up results -Start aspirin 81 mg daily if AC is not an option -7/29 Echo--no thrombi   Subacute DVT right popliteal vein -05/28/2021 subacute DVT right popliteal vein, acute superficial thrombophlebitis right small saphenous vein, remote DVT left common femoral vein -Status post IVC filter 04/28/2021, Dr. Lucky Cowboy   GAVE/esophageal varices/history of GI bleeding -GI consult as discussed -Continue Protonix -Baseline hemoglobin 9-10 -Hgb 8.8 at the time of d/c  Atypical chest pain -troponins neg -personally reviewed CXR--no infiltrates or edema -7/29 Echo--no WMA, EF 60-65%   Closed fracture of the right lower extremity -managed nonoperatively due to high perioperative risk. -Continue nonweightbearing, brace to right knee -PT eval>>SNF -discussed with patient--she refuses to return to SNF -HHPT and hoyer lift set up for home d/c -pt needs to follow up with ortho--Dr. Renee Harder     NASH Liver cirrhosis/esophageal varices/hepatic encephalopathy -stable.   -Reports compliance with lactulose -Resume home lactulose -Continue Lasix and spironolactone   Chronic diastolic CHF -4/0/1027 echo EF 55 to 60%, no WMA, G1DD -Appears clinically euvolemic -Resume Lasix 40 mg daily, metolazone 5 mg daily   Controlled Diabetes mellitus-diet controlled. Hgba1c 5.6 - Daily fasting CBGs  Hyponatremia -chronic, stable -due to liver cirrhosis   CKD 4 -baseline creatinine 1.6-1.9   Morbid Obesity -lifestyle modification -BMI 35.12    Allergies as of 06/12/2021   No Known Allergies      Medication List     STOP taking these medications    amoxicillin-clavulanate 875-125 MG tablet Commonly known as: AUGMENTIN   benzonatate 100 MG capsule Commonly known as: TESSALON       TAKE these medications    acetaminophen 500 MG tablet Commonly known as: TYLENOL Take 1 tablet (500 mg total) by mouth every 6 (six) hours as needed for mild  pain, moderate pain, fever or headache.   aspirin 81 MG EC tablet Take 1 tablet (81 mg total) by mouth daily. Swallow whole.   furosemide 80 MG tablet Commonly known as: LASIX Take 0.5 tablets (40 mg total) by mouth daily.   gabapentin 100 MG capsule Commonly known as: NEURONTIN Take 100 mg by mouth daily. For 3 days   hydrOXYzine 25 MG tablet Commonly known as: ATARAX/VISTARIL Take 1 tablet (25 mg total) by mouth 2 (two) times daily as needed. What changed: reasons to take this   lactulose 10 GM/15ML solution Commonly known as: CHRONULAC Take 30 mLs (20 g total) by mouth 3 (three) times daily.   metolazone 5 MG tablet Commonly known as: ZAROXOLYN Take 1 tablet (5 mg total) by mouth every other day. What changed: when to take this   ondansetron 4 MG tablet Commonly known as: ZOFRAN Take 4 mg by mouth every 8 (eight) hours as needed for nausea or vomiting.   oxyCODONE 5 MG immediate release tablet Commonly known as: Oxy IR/ROXICODONE Take 1 tablet (5 mg total) by mouth every 6 (six) hours as needed for up to 7 days for moderate pain or severe pain.   pantoprazole 40 MG tablet Commonly known as: PROTONIX Take 1 tablet (40 mg total) by mouth 2 (two) times daily.   potassium chloride SA 20 MEQ tablet Commonly known as: KLOR-CON Take 1 tablet (20 mEq total) by mouth daily.   spironolactone 50 MG tablet Commonly known as: ALDACTONE Take 1 tablet (50 mg total) by mouth daily.   Vitamin D (Ergocalciferol) 1.25 MG (50000 UNIT) Caps capsule Commonly known as: DRISDOL Take 50,000 Units by mouth every Sunday.  Durable Medical Equipment  (From admission, onward)           Start     Ordered   06/10/21 1615  For home use only DME Other see comment  Once       Question:  Length of Need  Answer:  Lifetime   06/10/21 1615            No Known Allergies  Consultations: GI General surgery   Procedures/Studies: CT Head Wo Contrast  Result  Date: 05/23/2021 CLINICAL DATA:  Pain after fall EXAM: CT HEAD WITHOUT CONTRAST CT CERVICAL SPINE WITHOUT CONTRAST TECHNIQUE: Multidetector CT imaging of the head and cervical spine was performed following the standard protocol without intravenous contrast. Multiplanar CT image reconstructions of the cervical spine were also generated. COMPARISON:  Apr 04, 2021 FINDINGS: CT HEAD FINDINGS Brain: Similar mild age related global parenchymal volume loss. Stable mild burden of chronic ischemic white matter microvascular disease. No evidence of acute large vascular territory infarction, hemorrhage, hydrocephalus, extra-axial collection or mass lesion/mass effect. Vascular: No hyperdense vessel. Atherosclerotic calcifications of the internal carotid and vertebral arteries at the skull base. Skull: Normal. Negative for fracture or focal lesion. Sinuses/Orbits: Visualized portions of the paranasal sinuses and mastoid air cells are predominantly clear. Orbits are grossly unremarkable. Other: None CT CERVICAL SPINE FINDINGS Alignment: Mild loss of the normal cervical lordosis. No evidence of traumatic listhesis. 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 No acute fracture. Soft tissues and spinal canal: No prevertebral fluid or swelling. No visible canal hematoma. Disc levels: Moderate multilevel discogenic disease most notable in the lower cervical spine. Upper chest: No acute abnormality. Other: Calcifications in the bilateral parotid glands. Carotid artery calcifications. Partially visualized right chest wall Port-A-Cath. IMPRESSION: 1. No evidence of acute intracranial process. 2. No evidence of acute fracture or traumatic listhesis of the cervical spine. 3. 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 4. 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 Electronically Signed   By: Dahlia Bailiff MD   On: 05/23/2021 17:53   CT Cervical Spine Wo Contrast  Result Date: 05/23/2021 CLINICAL DATA:  Pain after fall EXAM: CT HEAD WITHOUT CONTRAST CT CERVICAL SPINE WITHOUT CONTRAST TECHNIQUE: Multidetector CT imaging of the head and cervical spine was performed following the standard protocol without intravenous contrast. Multiplanar CT image reconstructions of the cervical spine were also generated. COMPARISON:  Apr 04, 2021 FINDINGS: CT HEAD FINDINGS Brain: Similar mild age related global parenchymal volume loss. Stable mild burden of chronic ischemic white matter microvascular disease. No evidence of acute large vascular territory infarction, hemorrhage, hydrocephalus, extra-axial collection or mass lesion/mass effect. Vascular: No hyperdense vessel. Atherosclerotic calcifications of the internal carotid and vertebral arteries at the skull base. Skull: Normal. Negative for fracture or focal lesion. Sinuses/Orbits: Visualized portions of the paranasal sinuses and mastoid air cells are predominantly clear. Orbits are grossly unremarkable. Other: None CT CERVICAL SPINE FINDINGS Alignment: Mild loss of the normal cervical lordosis. No evidence of traumatic listhesis. 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 No acute fracture. Soft tissues and spinal canal: No prevertebral fluid or swelling. No visible canal hematoma. Disc levels: Moderate multilevel discogenic disease most notable in the lower cervical spine. Upper chest: No acute abnormality. Other:  Calcifications in the bilateral parotid glands. Carotid artery calcifications. Partially visualized right chest wall Port-A-Cath. IMPRESSION: 1. No evidence of acute intracranial process. 2. No evidence of acute fracture or traumatic listhesis of the cervical spine. 3. Moderate  multilevel discogenic disease of the cervical spine. 4. 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 Electronically Signed   By: Dahlia Bailiff MD   On: 05/23/2021 17:53   CT Knee Right Wo Contrast  Result Date: 05/23/2021 CLINICAL DATA:  Fall EXAM: CT OF THE RIGHT KNEE WITHOUT CONTRAST TECHNIQUE: Multidetector CT imaging of the right knee was performed according to the standard protocol. Multiplanar CT image reconstructions were also generated. COMPARISON:  None. FINDINGS: There is a nondisplaced fracture of the distal right femur with fracture lines extending to the supracondylar lateral femoral surface. Fracture line of the middle portion of the distal femoral shaft extends to intercondylar surface. Fracture lines best delineated on series 4 and series 101. Right suprapatellar hemarthrosis. IMPRESSION: 1. Nondisplaced fracture of the distal right femur with fracture lines extending to the supracondylar lateral femoral cortex and intercondylar surface. 2. Right suprapatellar hemarthrosis. Electronically Signed   By: Ulyses Jarred M.D.   On: 05/23/2021 21:23   MR BRAIN WO CONTRAST  Result Date: 05/27/2021 CLINICAL DATA:  Mental status change, unknown cause. EXAM: MRI HEAD WITHOUT CONTRAST TECHNIQUE: Multiplanar, multiecho pulse sequences of the brain and surrounding structures were obtained without intravenous contrast. COMPARISON:  Head CT May 23, 2021. FINDINGS: Brain: No acute infarction, hemorrhage, hydrocephalus, extra-axial collection or mass lesion. A few foci of T2 hyperintensity are seen within the white matter of the cerebral hemispheres, nonspecific. Vascular: Normal flow voids. Skull and upper cervical spine: Normal marrow signal. Sinuses/Orbits: Negative. Other: None. IMPRESSION: 1. No acute intracranial abnormality. 2. Small amount of nonspecific T2 hyperintense lesions of the white matter, may represent early  chronic microangiopathy. Electronically Signed   By: Pedro Earls M.D.   On: 05/27/2021 13:10   CT ABDOMEN PELVIS W CONTRAST  Result Date: 06/09/2021 CLINICAL DATA:  Abdominal pain EXAM: CT ABDOMEN AND PELVIS WITH CONTRAST TECHNIQUE: Multidetector CT imaging of the abdomen and pelvis was performed using the standard protocol following bolus administration of intravenous contrast. CONTRAST:  61m OMNIPAQUE IOHEXOL 300 MG/ML  SOLN COMPARISON:  CT abdomen and pelvis dated August 05, 2018 FINDINGS: Lower chest: Moderate loculated right pleural effusion with atelectasis. Trace left pleural effusion. Cardiomegaly with coronary artery calcifications. No pericardial effusion. Hepatobiliary: No suspicious focal liver lesions tips shunt is in place and appears patent. Cholelithiasis no evidence of acute cholecystitis. No biliary ductal dilation. Pancreas: Embolization coils noted near the pancreatic head. Pancreas is unremarkable. Spleen: Splenomegaly. Wedge-shaped peripheral area of hypoattenuation seen in the spleen compatible with splenic infarct. Adrenals/Urinary Tract: Bilateral adrenal glands are unremarkable. Bilateral kidneys are normal in appearance. Normal bladder. Stomach/Bowel: Bowel is normal in caliber with no evidence of obstruction or wall thickening. Normal appendix. Normal appearance of the stomach. Vascular/Lymphatic: Aortic atherosclerotic disease no pathologically enlarged nodes seen in the abdomen or pelvis. IVC filter noted in the infrarenal IVC. Reproductive: Uterus is present. Cystic lesion the left adnexa, measuring up to 4.1 cm, was previously seen on prior CT when it measured to 6.5 cm. Other: No abdominal ascites.  Now free intraperitoneal air. Soft tissue anasarca Musculoskeletal: Grade 1 anterolisthesis of L4 on L5. No acute osseous abnormalities. IMPRESSION: New splenic infarct. Patent TIPS. Partially visualized moderate loculated right  pleural effusion. Electronically  Signed   By: Yetta Glassman MD   On: 06/09/2021 14:39   CARDIAC CATHETERIZATION  Result Date: 05/20/2021 Conclusions: 1. Normal left heart and pulmonary artery pressures. 2. Mildly elevated right heart filling pressures. 3. Normal cardiac output/index. Recommendations: 1. Chronic lower extremity edema does not appear to be driven primarily by heart failure. 2. Continue current doses of furosemide and spironolactone.  Decrease metolazone to 5 mg every other day to lessen risk for worsening renal insufficiency. 3. Further management per hepatology and nephrology, as chronic edema favored to be due to chronic liver and kidney disease. Nelva Bush, MD [facility]   US RENAL  Result Date: 05/27/2021 CLINICAL DATA:  Acute kidney injury EXAM: RENAL / URINARY TRACT ULTRASOUND COMPLETE COMPARISON:  None. FINDINGS: Right Kidney: Renal measurements: 10.5 x 4.4 x 3.7 cm = volume: 90.5 mL. Echogenicity within normal limits. No mass or hydronephrosis visualized. Left Kidney: Renal measurements: 10.3 x 4.0 x 4.7 cm = volume: 100 mL. Echogenicity within normal limits. No mass or hydronephrosis visualized. Bladder: Appears normal for degree of bladder distention. Other: None. IMPRESSION: Negative renal ultrasound.  No hydronephrosis. Electronically Signed   By: Julian Hy M.D.   On: 05/27/2021 19:13   US Abdomen Limited  Result Date: 05/28/2021 CLINICAL DATA:  Abdominal distension, possible ascites EXAM: LIMITED ABDOMEN ULTRASOUND FOR ASCITES TECHNIQUE: Limited ultrasound survey for ascites was performed in all four abdominal quadrants. COMPARISON:  None. FINDINGS: Ultrasound interrogation of the 4 quadrants of the abdomen demonstrates no evidence of ascites. IMPRESSION: Negative for ascites. Electronically Signed   By: Jacqulynn Cadet M.D.   On: 05/28/2021 14:40   US Venous Img Lower Bilateral (DVT)  Result Date: 05/28/2021 CLINICAL DATA:  Bilateral lower extremity edema EXAM: BILATERAL LOWER  EXTREMITY VENOUS DOPPLER ULTRASOUND TECHNIQUE: Gray-scale sonography with graded compression, as well as color Doppler and duplex ultrasound were performed to evaluate the lower extremity deep venous systems from the level of the common femoral vein and including the common femoral, femoral, profunda femoral, popliteal and calf veins including the posterior tibial, peroneal and gastrocnemius veins when visible. The superficial great saphenous vein was also interrogated. Spectral Doppler was utilized to evaluate flow at rest and with distal augmentation maneuvers in the common femoral, femoral and popliteal veins. COMPARISON:  None. FINDINGS: RIGHT LOWER EXTREMITY Common Femoral Vein: No evidence of thrombus. Normal compressibility, respiratory phasicity and response to augmentation. Saphenofemoral Junction: No evidence of thrombus. Normal compressibility and flow on color Doppler imaging. Profunda Femoral Vein: No evidence of thrombus. Normal compressibility and flow on color Doppler imaging. Femoral Vein: No evidence of thrombus. Normal compressibility, respiratory phasicity and response to augmentation. Popliteal Vein: Eccentric wall thickening in the right popliteal vein consistent with wall adherent thrombus. The vessel is partially compressible. There is evidence of color flow in the patent lumen on color Doppler imaging. Calf Veins: No evidence of thrombus. Normal compressibility and flow on color Doppler imaging. Superficial Great Saphenous Vein: No evidence of thrombus. Normal compressibility. Venous Reflux:  None. Other Findings: Occlusive thrombus extends into the right small saphenous vein. LEFT LOWER EXTREMITY Common Femoral Vein: No acute thrombus. The vessel is patent and compressible. There may be some trace eccentric wall thickening at the saphenofemoral junction consistent with sequelae of remote and recanalized DVT. Saphenofemoral Junction: No evidence of thrombus. Normal compressibility and flow  on color Doppler imaging. Profunda Femoral Vein: No evidence of thrombus. Normal compressibility and flow on color Doppler imaging. Femoral Vein: No  evidence of thrombus. Normal compressibility, respiratory phasicity and response to augmentation. Popliteal Vein: No evidence of thrombus. Normal compressibility, respiratory phasicity and response to augmentation. Calf Veins: No evidence of thrombus. Normal compressibility and flow on color Doppler imaging. Superficial Great Saphenous Vein: No evidence of thrombus. Normal compressibility. Venous Reflux:  None. Other Findings:  None. IMPRESSION: 1. Subacute and partially recanalized nonocclusive DVT in the right popliteal vein. 2. Acute occlusive superficial thrombophlebitis involving the right small saphenous vein. 3. 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 Electronically Signed   By: Jacqulynn Cadet M.D.   On: 05/28/2021 14:39   DG CHEST PORT 1 VIEW  Result Date: 06/11/2021 CLINICAL DATA:  Dyspnea. EXAM: PORTABLE CHEST 1 VIEW COMPARISON:  Single-view of the chest 05/28/2021. FINDINGS: Port-A-Cath is unchanged. Right basilar atelectasis seen on the prior exam appears. Trace bilateral pleural effusions are unchanged. Heart size is normal. No pneumothorax. IMPRESSION: Improved right basilar atelectasis. Very small bilateral pleural effusions. Electronically Signed   By: Inge Rise M.D.   On: 06/11/2021 15:48   DG Chest Port 1 View  Result Date: 05/28/2021 CLINICAL DATA:  Possible sepsis39 y.o. 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 EXAM: PORTABLE CHEST 1 VIEW COMPARISON:  05/23/2021 and older exams. FINDINGS: Opacity noted at the lung bases, greater on the right, consistent with atelectasis, mildly increased from the previous study. Remainder of the lungs is clear. Cardiac silhouette normal in size. Normal mediastinal and hilar  contours. No convincing pleural effusion and no pneumothorax. Stable right internal jugular Port-A-Cath. IMPRESSION: 1. Mild, right greater than left, basilar atelectasis, mildly increased from the prior study. 2. No convincing pneumonia. No evidence of pulmonary edema. No other change. Electronically Signed   By: Lajean Manes M.D.   On: 05/28/2021 17:42   DG Chest Portable 1 View  Result Date: 05/23/2021 CLINICAL DATA:  Fall with blunt chest trauma EXAM: PORTABLE CHEST 1 VIEW COMPARISON:  Radiograph 04/29/2021, CT 02/01/2021 FINDINGS: Chronically coarsened interstitial and bronchitic features. More bandlike areas of opacity in the lung bases likely corresponding to areas of scarring in regions of as well as more nodular opacity in the right mid to lower lung no new consolidative process. No pneumothorax or effusion. No acute traumatic findings of the chest wall. Port-A-Cath projects over the right chest with tip in the mid SVC. Stable cardiomediastinal contours. IMPRESSION: No acute cardiopulmonary or traumatic findings. Ill-defined nodular opacities in the right lung base may reflect regions of masslike opacity seen on comparison CT and radiography. Difficult to fully assess interval change on portable radiographs. Electronically Signed   By: Lovena Le M.D.   On: 05/23/2021 19:16   DG Knee Complete 4 Views Right  Result Date: 05/23/2021 CLINICAL DATA:  Pain after fall EXAM: RIGHT KNEE - COMPLETE 4+ VIEW COMPARISON:  None. FINDINGS: No evidence of fracture or dislocation. Moderate suprapatellar joint effusion with overlying soft tissue swelling. Patellar enthesophyte. Mild tricompartment degenerative change. Vascular calcifications. IMPRESSION: 1. No acute osseous abnormality. 2. Moderate suprapatellar joint effusion with overlying soft tissue swelling 3. Mild tricompartment degenerative change. Electronically Signed   By: Dahlia Bailiff MD   On: 05/23/2021 17:56   EEG adult  Result Date:  05/30/2021 Greta Doom, MD     05/30/2021  3:00 PM History: 70 yo F with AMS Sedation: None Technique: This is a 21 channel routine scalp  EEG performed at the bedside with bipolar and monopolar montages arranged in accordance to the international 10/20 system of electrode placement. One channel was dedicated to EKG recording. Background: There is a well-formed posterior dominant rhythm of 8 Hz which is reactive to eye opening/closure.  In addition there is intrusion into the background of irregular low voltage delta and theta activity even during maximal wakefulness.  With drowsiness, there is prominent frontally predominant intermittent rhythmic delta activity(FIRDA). Photic stimulation: Physiologic driving is not performed EEG Abnormalities: 1) FIRDA 2) Generalized irregular slow activity. Clinical Interpretation: This EEG is consistent with a mild generalized nonspecific cerebral dysfunction (encephalopathy). There was no seizure or seizure predisposition recorded on this study. Please note that lack of epileptiform activity on EEG does not preclude the possibility of epilepsy. Roland Rack, MD [facility] [phone] If 7pm- 7am, please page neurology on call as listed in [place].   ECHOCARDIOGRAM LIMITED  Result Date: 06/10/2021    ECHOCARDIOGRAM LIMITED REPORT   Patient Name:   Remmi A Chesnut Date of Exam: 06/10/2021 Medical Rec #:  [id number]    Height:       62.0 in Accession #:    [id number]   Weight:       192.0 lb Date of Birth:  [birth year]    BSA:          1.879 m Patient Age:    36 years     BP:           118/53 mmHg Patient Gender: F            HR:           72 bpm. Exam Location:  [facility] Procedure: Limited Echo Indications:    Splenic Infarct  History:        Patient has prior history of Echocardiogram examinations, most                 recent 12/14/2020. CHF, CAD; Risk Factors:Diabetes.  Sonographer:    Wenda Low Referring Phys: [place]  1. Left ventricular ejection fraction, by estimation, is 60 to 65%. The left ventricle has normal function. The left ventricle has no regional wall motion abnormalities. There is moderate left ventricular hypertrophy.  2. The aortic valve is tricuspid.  3. The inferior vena cava is normal in size with greater than 50% respiratory variability, suggesting right atrial pressure of 3 mmHg.  4. Limited echo for splenic infarct, evaluate potential thromboembolic source. No evidence of intracardiac course from this limtied echo. FINDINGS  Left Ventricle: Left ventricular ejection fraction, by estimation, is 60 to 65%. The left ventricle has normal function. The left ventricle has no regional wall motion abnormalities. The left ventricular internal cavity size was normal in size. There is  moderate left ventricular hypertrophy. Left Atrium: Left atrial size was normal in size. Right Atrium: Right atrial size was normal in size. Pericardium: There is no evidence of pericardial effusion. Mitral Valve: Mild mitral annular calcification. Aortic Valve: The aortic valve is tricuspid. Aorta: The aortic root is normal in size and structure. Venous: The inferior vena cava is normal in size with greater than 50% respiratory variability, suggesting right atrial pressure of 3 mmHg. LEFT VENTRICLE PLAX 2D LVIDd:         4.41 cm LVIDs:         2.44 cm LV PW:         1.32 cm LV IVS:  1.45 cm LVOT diam:     2.00 cm LVOT Area:     3.14 cm  LEFT ATRIUM         Index LA diam:    4.10 cm 2.18 cm/m   AORTA Ao Root diam: 3.70 cm  SHUNTS Systemic Diam: 2.00 cm Carlyle Dolly MD Electronically signed by Carlyle Dolly MD Signature Date/Time: 06/10/2021/1:01:11 PM    Final         Discharge Exam: Vitals:   06/11/21 2051 06/12/21 0413  BP: 127/68 122/60  Pulse: (!) 102 87  Resp: 18 16  Temp: 99 F (37.2 C) 99.6 F (37.6 C)  SpO2: 91% 93%   Vitals:   06/10/21 2239 06/11/21 1317 06/11/21 2051 06/12/21 0413  BP:  133/60 129/63 127/68 122/60  Pulse: 91 79 (!) 102 87  Resp: _0 Temp: 98.7 F (37.1 C) 98.4 F (36.9 C) 99 F (37.2 C) 99.6 F (37.6 C)  TempSrc: Oral Oral Oral Oral  SpO2: 93% 90% 91% 93%  Weight:      Height:        General: Pt is alert, awake, not in acute distress Cardiovascular: RRR, S1/S2 +, no rubs, no gallops Respiratory: bibasilar crackles. No wheeze Abdominal: Soft, NT, ND, bowel sounds + Extremities: no edema, no cyanosis   The results of significant diagnostics from this hospitalization (including imaging, microbiology, ancillary and laboratory) are listed below for reference.    Significant Diagnostic Studies: CT Head Wo Contrast  Result Date: 05/23/2021 CLINICAL DATA:  Pain after fall EXAM: CT HEAD WITHOUT CONTRAST CT CERVICAL SPINE WITHOUT CONTRAST TECHNIQUE: Multidetector CT imaging of the head and cervical spine was performed following the standard protocol without intravenous contrast. Multiplanar CT image reconstructions of the cervical spine were also generated. COMPARISON:  Apr 04, 2021 FINDINGS: CT HEAD FINDINGS Brain: Similar mild age related global parenchymal volume loss. Stable mild burden of chronic ischemic white matter microvascular disease. No evidence of acute large vascular territory infarction, hemorrhage, hydrocephalus, extra-axial collection or mass lesion/mass effect. Vascular: No hyperdense vessel. Atherosclerotic calcifications of the internal carotid and vertebral arteries at the skull base. Skull: Normal. Negative for fracture or focal lesion. Sinuses/Orbits: Visualized portions of the paranasal sinuses and mastoid air cells are predominantly clear. Orbits are grossly unremarkable. Other: None CT CERVICAL SPINE FINDINGS Alignment: Mild loss of the normal cervical lordosis. No evidence of traumatic listhesis. 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 No acute fracture. Soft tissues and spinal canal: No prevertebral fluid or swelling. No visible canal hematoma. Disc levels: Moderate multilevel discogenic disease most notable in the lower cervical spine. Upper chest: No acute abnormality. Other: Calcifications in the bilateral parotid glands. Carotid artery calcifications. Partially visualized right chest wall Port-A-Cath. IMPRESSION: 1. No evidence of acute intracranial process. 2. No evidence of acute fracture or traumatic listhesis of the cervical spine. 3. 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 4. 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 Electronically Signed   By: Dahlia Bailiff MD   On: 05/23/2021 17:53   CT Cervical Spine Wo Contrast  Result Date: 05/23/2021 CLINICAL DATA:  Pain after fall EXAM: CT HEAD WITHOUT CONTRAST CT CERVICAL SPINE WITHOUT CONTRAST TECHNIQUE: Multidetector CT imaging of the head and cervical spine was performed following the standard protocol without intravenous contrast. Multiplanar CT image  reconstructions of the cervical spine were also generated. COMPARISON:  Apr 04, 2021 FINDINGS: CT HEAD FINDINGS Brain: Similar mild age related global parenchymal volume loss. Stable mild burden of chronic ischemic white matter microvascular disease. No evidence of acute large vascular territory infarction, hemorrhage, hydrocephalus, extra-axial collection or mass lesion/mass effect. Vascular: No hyperdense vessel. Atherosclerotic calcifications of the internal carotid and vertebral arteries at the skull base. Skull: Normal. Negative for fracture or focal lesion. Sinuses/Orbits: Visualized portions of the paranasal sinuses and mastoid air cells are predominantly clear. Orbits are grossly unremarkable. Other: None CT CERVICAL SPINE FINDINGS Alignment: Mild loss of the normal cervical lordosis. No evidence of traumatic  listhesis. 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 No acute fracture. Soft tissues and spinal canal: No prevertebral fluid or swelling. No visible canal hematoma. Disc levels: Moderate multilevel discogenic disease most notable in the lower cervical spine. Upper chest: No acute abnormality. Other: Calcifications in the bilateral parotid glands. Carotid artery calcifications. Partially visualized right chest wall Port-A-Cath. IMPRESSION: 1. No evidence of acute intracranial process. 2. No evidence of acute fracture or traumatic listhesis of the cervical spine. 3. 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 4. 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 Electronically Signed   By: Dahlia Bailiff MD   On: 05/23/2021 17:53   CT Knee Right Wo Contrast  Result Date: 05/23/2021 CLINICAL DATA:  Fall EXAM: CT OF THE RIGHT KNEE WITHOUT CONTRAST TECHNIQUE: Multidetector CT imaging of the right knee was performed according to the standard protocol. Multiplanar CT image reconstructions were also generated. COMPARISON:  None. FINDINGS: There is a nondisplaced fracture of the distal right femur with fracture lines extending to the supracondylar lateral femoral surface. Fracture line of the middle portion of the distal femoral shaft extends to intercondylar surface. Fracture lines best delineated on series 4 and series 101. Right suprapatellar hemarthrosis. IMPRESSION: 1. Nondisplaced fracture of the distal right femur with fracture lines extending to the supracondylar lateral femoral cortex and intercondylar surface. 2. Right suprapatellar hemarthrosis. Electronically Signed   By: Ulyses Jarred M.D.   On: 05/23/2021 21:23   MR BRAIN WO CONTRAST  Result Date: 05/27/2021 CLINICAL DATA:  Mental status change, unknown  cause. EXAM: MRI HEAD WITHOUT CONTRAST TECHNIQUE: Multiplanar, multiecho pulse sequences of the brain and surrounding structures were obtained without intravenous contrast. COMPARISON:  Head CT May 23, 2021. FINDINGS: Brain: No acute infarction, hemorrhage, hydrocephalus, extra-axial collection or mass lesion. A few foci of T2 hyperintensity are seen within the white matter of the cerebral hemispheres, nonspecific. Vascular: Normal flow voids. Skull and upper cervical spine: Normal marrow signal. Sinuses/Orbits: Negative. Other: None. IMPRESSION: 1. No acute intracranial abnormality. 2. Small amount of nonspecific T2 hyperintense lesions of the white matter, may represent early chronic microangiopathy. Electronically Signed   By: Pedro Earls M.D.   On: 05/27/2021 13:10   CT ABDOMEN PELVIS W CONTRAST  Result Date: 06/09/2021 CLINICAL DATA:  Abdominal pain EXAM: CT ABDOMEN AND PELVIS WITH CONTRAST TECHNIQUE: Multidetector CT imaging of the abdomen and pelvis was performed using the standard protocol following bolus administration of intravenous contrast. CONTRAST:  71m OMNIPAQUE IOHEXOL 300 MG/ML  SOLN COMPARISON:  CT abdomen and pelvis dated August 05, 2018 FINDINGS: Lower chest: Moderate loculated right pleural effusion with atelectasis. Trace left pleural effusion. Cardiomegaly with coronary  artery calcifications. No pericardial effusion. Hepatobiliary: No suspicious focal liver lesions tips shunt is in place and appears patent. Cholelithiasis no evidence of acute cholecystitis. No biliary ductal dilation. Pancreas: Embolization coils noted near the pancreatic head. Pancreas is unremarkable. Spleen: Splenomegaly. Wedge-shaped peripheral area of hypoattenuation seen in the spleen compatible with splenic infarct. Adrenals/Urinary Tract: Bilateral adrenal glands are unremarkable. Bilateral kidneys are normal in appearance. Normal bladder. Stomach/Bowel: Bowel is normal in caliber with no  evidence of obstruction or wall thickening. Normal appendix. Normal appearance of the stomach. Vascular/Lymphatic: Aortic atherosclerotic disease no pathologically enlarged nodes seen in the abdomen or pelvis. IVC filter noted in the infrarenal IVC. Reproductive: Uterus is present. Cystic lesion the left adnexa, measuring up to 4.1 cm, was previously seen on prior CT when it measured to 6.5 cm. Other: No abdominal ascites.  Now free intraperitoneal air. Soft tissue anasarca Musculoskeletal: Grade 1 anterolisthesis of L4 on L5. No acute osseous abnormalities. IMPRESSION: New splenic infarct. Patent TIPS. Partially visualized moderate loculated right pleural effusion. Electronically Signed   By: Yetta Glassman MD   On: 06/09/2021 14:39   CARDIAC CATHETERIZATION  Result Date: 05/20/2021 Conclusions: 1. Normal left heart and pulmonary artery pressures. 2. Mildly elevated right heart filling pressures. 3. Normal cardiac output/index. Recommendations: 1. Chronic lower extremity edema does not appear to be driven primarily by heart failure. 2. Continue current doses of furosemide and spironolactone.  Decrease metolazone to 5 mg every other day to lessen risk for worsening renal insufficiency. 3. Further management per hepatology and nephrology, as chronic edema favored to be due to chronic liver and kidney disease. Nelva Bush, MD [facility]   US RENAL  Result Date: 05/27/2021 CLINICAL DATA:  Acute kidney injury EXAM: RENAL / URINARY TRACT ULTRASOUND COMPLETE COMPARISON:  None. FINDINGS: Right Kidney: Renal measurements: 10.5 x 4.4 x 3.7 cm = volume: 90.5 mL. Echogenicity within normal limits. No mass or hydronephrosis visualized. Left Kidney: Renal measurements: 10.3 x 4.0 x 4.7 cm = volume: 100 mL. Echogenicity within normal limits. No mass or hydronephrosis visualized. Bladder: Appears normal for degree of bladder distention. Other: None. IMPRESSION: Negative renal ultrasound.  No hydronephrosis.  Electronically Signed   By: Julian Hy M.D.   On: 05/27/2021 19:13   US Abdomen Limited  Result Date: 05/28/2021 CLINICAL DATA:  Abdominal distension, possible ascites EXAM: LIMITED ABDOMEN ULTRASOUND FOR ASCITES TECHNIQUE: Limited ultrasound survey for ascites was performed in all four abdominal quadrants. COMPARISON:  None. FINDINGS: Ultrasound interrogation of the 4 quadrants of the abdomen demonstrates no evidence of ascites. IMPRESSION: Negative for ascites. Electronically Signed   By: Jacqulynn Cadet M.D.   On: 05/28/2021 14:40   US Venous Img Lower Bilateral (DVT)  Result Date: 05/28/2021 CLINICAL DATA:  Bilateral lower extremity edema EXAM: BILATERAL LOWER EXTREMITY VENOUS DOPPLER ULTRASOUND TECHNIQUE: Gray-scale sonography with graded compression, as well as color Doppler and duplex ultrasound were performed to evaluate the lower extremity deep venous systems from the level of the common femoral vein and including the common femoral, femoral, profunda femoral, popliteal and calf veins including the posterior tibial, peroneal and gastrocnemius veins when visible. The superficial great saphenous vein was also interrogated. Spectral Doppler was utilized to evaluate flow at rest and with distal augmentation maneuvers in the common femoral, femoral and popliteal veins. COMPARISON:  None. FINDINGS: RIGHT LOWER EXTREMITY Common Femoral Vein: No evidence of thrombus. Normal compressibility, respiratory phasicity and response to augmentation. Saphenofemoral Junction: No evidence of thrombus. Normal compressibility and flow on  color Doppler imaging. Profunda Femoral Vein: No evidence of thrombus. Normal compressibility and flow on color Doppler imaging. Femoral Vein: No evidence of thrombus. Normal compressibility, respiratory phasicity and response to augmentation. Popliteal Vein: Eccentric wall thickening in the right popliteal vein consistent with wall adherent thrombus. The vessel is partially  compressible. There is evidence of color flow in the patent lumen on color Doppler imaging. Calf Veins: No evidence of thrombus. Normal compressibility and flow on color Doppler imaging. Superficial Great Saphenous Vein: No evidence of thrombus. Normal compressibility. Venous Reflux:  None. Other Findings: Occlusive thrombus extends into the right small saphenous vein. LEFT LOWER EXTREMITY Common Femoral Vein: No acute thrombus. The vessel is patent and compressible. There may be some trace eccentric wall thickening at the saphenofemoral junction consistent with sequelae of remote and recanalized DVT. Saphenofemoral Junction: No evidence of thrombus. Normal compressibility and flow on color Doppler imaging. Profunda Femoral Vein: No evidence of thrombus. Normal compressibility and flow on color Doppler imaging. Femoral Vein: No evidence of thrombus. Normal compressibility, respiratory phasicity and response to augmentation. Popliteal Vein: No evidence of thrombus. Normal compressibility, respiratory phasicity and response to augmentation. Calf Veins: No evidence of thrombus. Normal compressibility and flow on color Doppler imaging. Superficial Great Saphenous Vein: No evidence of thrombus. Normal compressibility. Venous Reflux:  None. Other Findings:  None. IMPRESSION: 1. Subacute and partially recanalized nonocclusive DVT in the right popliteal vein. 2. Acute occlusive superficial thrombophlebitis involving the right small saphenous vein. 3. 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 Electronically Signed   By: Jacqulynn Cadet M.D.   On: 05/28/2021 14:39   DG CHEST PORT 1 VIEW  Result Date: 06/11/2021 CLINICAL DATA:  Dyspnea. EXAM: PORTABLE CHEST 1 VIEW COMPARISON:  Single-view of the chest 05/28/2021. FINDINGS: Port-A-Cath is unchanged. Right basilar atelectasis seen on the prior exam appears. Trace bilateral pleural effusions are unchanged. Heart size is normal. No  pneumothorax. IMPRESSION: Improved right basilar atelectasis. Very small bilateral pleural effusions. Electronically Signed   By: Inge Rise M.D.   On: 06/11/2021 15:48   DG Chest Port 1 View  Result Date: 05/28/2021 CLINICAL DATA:  Possible sepsis23 y.o. 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 EXAM: PORTABLE CHEST 1 VIEW COMPARISON:  05/23/2021 and older exams. FINDINGS: Opacity noted at the lung bases, greater on the right, consistent with atelectasis, mildly increased from the previous study. Remainder of the lungs is clear. Cardiac silhouette normal in size. Normal mediastinal and hilar contours. No convincing pleural effusion and no pneumothorax. Stable right internal jugular Port-A-Cath. IMPRESSION: 1. Mild, right greater than left, basilar atelectasis, mildly increased from the prior study. 2. No convincing pneumonia. No evidence of pulmonary edema. No other change. Electronically Signed   By: Lajean Manes M.D.   On: 05/28/2021 17:42   DG Chest Portable 1 View  Result Date: 05/23/2021 CLINICAL DATA:  Fall with blunt chest trauma EXAM: PORTABLE CHEST 1 VIEW COMPARISON:  Radiograph 04/29/2021, CT 02/01/2021 FINDINGS: Chronically coarsened interstitial and bronchitic features. More bandlike areas of opacity in the lung bases likely corresponding to areas of scarring in regions of as well as more nodular opacity in the right mid to lower lung no new consolidative process. No pneumothorax or effusion. No acute traumatic findings of the chest wall. Port-A-Cath projects over the right chest with tip in the mid SVC. Stable cardiomediastinal contours. IMPRESSION:  No acute cardiopulmonary or traumatic findings. Ill-defined nodular opacities in the right lung base may reflect regions of masslike opacity seen on comparison CT and radiography. Difficult to fully assess interval change on portable radiographs.  Electronically Signed   By: Lovena Le M.D.   On: 05/23/2021 19:16   DG Knee Complete 4 Views Right  Result Date: 05/23/2021 CLINICAL DATA:  Pain after fall EXAM: RIGHT KNEE - COMPLETE 4+ VIEW COMPARISON:  None. FINDINGS: No evidence of fracture or dislocation. Moderate suprapatellar joint effusion with overlying soft tissue swelling. Patellar enthesophyte. Mild tricompartment degenerative change. Vascular calcifications. IMPRESSION: 1. No acute osseous abnormality. 2. Moderate suprapatellar joint effusion with overlying soft tissue swelling 3. Mild tricompartment degenerative change. Electronically Signed   By: Dahlia Bailiff MD   On: 05/23/2021 17:56   EEG adult  Result Date: 05/30/2021 Greta Doom, MD     05/30/2021  3:00 PM History: 70 yo F with AMS Sedation: None Technique: This is a 21 channel routine scalp EEG performed at the bedside with bipolar and monopolar montages arranged in accordance to the international 10/20 system of electrode placement. One channel was dedicated to EKG recording. Background: There is a well-formed posterior dominant rhythm of 8 Hz which is reactive to eye opening/closure.  In addition there is intrusion into the background of irregular low voltage delta and theta activity even during maximal wakefulness.  With drowsiness, there is prominent frontally predominant intermittent rhythmic delta activity(FIRDA). Photic stimulation: Physiologic driving is not performed EEG Abnormalities: 1) FIRDA 2) Generalized irregular slow activity. Clinical Interpretation: This EEG is consistent with a mild generalized nonspecific cerebral dysfunction (encephalopathy). There was no seizure or seizure predisposition recorded on this study. Please note that lack of epileptiform activity on EEG does not preclude the possibility of epilepsy. Roland Rack, MD [facility] [phone] If 7pm- 7am, please page neurology on call as listed in [place].   ECHOCARDIOGRAM  LIMITED  Result Date: 06/10/2021    ECHOCARDIOGRAM LIMITED REPORT   Patient Name:   Lilyrose A Minerd Date of Exam: 06/10/2021 Medical Rec #:  [id number]    Height:       62.0 in Accession #:    [id number]   Weight:       192.0 lb Date of Birth:  02-26-1951    BSA:          1.879 m Patient Age:    22 years     BP:           118/53 mmHg Patient Gender: F            HR:           72 bpm. Exam Location:  [facility] Procedure: Limited Echo Indications:    Splenic Infarct  History:        Patient has prior history of Echocardiogram examinations, most                 recent 12/14/2020. CHF, CAD; Risk Factors:Diabetes.  Sonographer:    Wenda Low Referring Phys: [place]  1. Left ventricular ejection fraction, by estimation, is 60 to 65%. The left ventricle has normal function. The left ventricle has no regional wall motion abnormalities. There is moderate left ventricular hypertrophy.  2. The aortic valve is tricuspid.  3. The inferior vena cava is normal in size with greater than 50% respiratory variability, suggesting right atrial pressure of 3 mmHg.  4. Limited echo for splenic infarct, evaluate potential thromboembolic source.  No evidence of intracardiac course from this limtied echo. FINDINGS  Left Ventricle: Left ventricular ejection fraction, by estimation, is 60 to 65%. The left ventricle has normal function. The left ventricle has no regional wall motion abnormalities. The left ventricular internal cavity size was normal in size. There is  moderate left ventricular hypertrophy. Left Atrium: Left atrial size was normal in size. Right Atrium: Right atrial size was normal in size. Pericardium: There is no evidence of pericardial effusion. Mitral Valve: Mild mitral annular calcification. Aortic Valve: The aortic valve is tricuspid. Aorta: The aortic root is normal in size and structure. Venous: The inferior vena cava is normal in size with greater than 50% respiratory variability,  suggesting right atrial pressure of 3 mmHg. LEFT VENTRICLE PLAX 2D LVIDd:         4.41 cm LVIDs:         2.44 cm LV PW:         1.32 cm LV IVS:        1.45 cm LVOT diam:     2.00 cm LVOT Area:     3.14 cm  LEFT ATRIUM         Index LA diam:    4.10 cm 2.18 cm/m   AORTA Ao Root diam: 3.70 cm  SHUNTS Systemic Diam: 2.00 cm Carlyle Dolly MD Electronically signed by Carlyle Dolly MD Signature Date/Time: 06/10/2021/1:01:11 PM    Final     Microbiology: Recent Results (from the past 240 hour(s))  SARS CORONAVIRUS 2 (Nyaira Hodgens 6-24 HRS) Nasopharyngeal Nasopharyngeal Swab     Status: None   Collection Time: 06/02/21 11:10 AM   Specimen: Nasopharyngeal Swab  Result Value Ref Range Status   SARS Coronavirus 2 NEGATIVE NEGATIVE Final    Comment: (NOTE) SARS-CoV-2 target nucleic acids are NOT DETECTED.  The SARS-CoV-2 RNA is generally detectable in upper and lower respiratory specimens during the acute phase of infection. Negative results do not preclude SARS-CoV-2 infection, do not rule out co-infections with other pathogens, and should not be used as the sole basis for treatment or other patient management decisions. Negative results must be combined with clinical observations, patient history, and epidemiological information. The expected result is Negative.  Fact Sheet for Patients: SugarRoll.be  Fact Sheet for Healthcare Providers: https://www.woods-mathews.com/  This test is not yet approved or cleared by the [place] FDA and  has been authorized for detection and/or diagnosis of SARS-CoV-2 by FDA under an Emergency Use Authorization (EUA). This EUA will remain  in effect (meaning this test can be used) for the duration of the COVID-19 declaration under Se ction 564(b)(1) of the Act, 21 U.S.C. section 360bbb-3(b)(1), unless the authorization is terminated or revoked sooner.  Performed at [facility], [place] [address], [place],  [place] [address]   Resp Panel by RT-PCR (Flu A&B, Covid) Nasopharyngeal Swab     Status: None   Collection Time: 06/09/21  7:25 PM   Specimen: Nasopharyngeal Swab; Nasopharyngeal(NP) swabs in vial transport medium  Result Value Ref Range Status   SARS Coronavirus 2 by RT PCR NEGATIVE NEGATIVE Final    Comment: (NOTE) SARS-CoV-2 target nucleic acids are NOT DETECTED.  The SARS-CoV-2 RNA is generally detectable in upper respiratory specimens during the acute phase of infection. The lowest concentration of SARS-CoV-2 viral copies this assay can detect is 138 copies/mL. A negative result does not preclude SARS-Cov-2 infection and should not be used as the sole basis for treatment or other patient management decisions. A negative  result may occur with  improper specimen collection/handling, submission of specimen other than nasopharyngeal swab, presence of viral mutation(s) within the areas targeted by this assay, and inadequate number of viral copies(<138 copies/mL). A negative result must be combined with clinical observations, patient history, and epidemiological information. The expected result is Negative.  Fact Sheet for Patients:  EntrepreneurPulse.com.au  Fact Sheet for Healthcare Providers:  IncredibleEmployment.be  This test is no t yet approved or cleared by the [place] FDA and  has been authorized for detection and/or diagnosis of SARS-CoV-2 by FDA under an Emergency Use Authorization (EUA). This EUA will remain  in effect (meaning this test can be used) for the duration of the COVID-19 declaration under Section 564(b)(1) of the Act, 21 U.S.C.section 360bbb-3(b)(1), unless the authorization is terminated  or revoked sooner.       Influenza A by PCR NEGATIVE NEGATIVE Final   Influenza B by PCR NEGATIVE NEGATIVE Final    Comment: (NOTE) The Xpert Xpress SARS-CoV-2/FLU/RSV plus assay is intended as an aid in the diagnosis of influenza  from Nasopharyngeal swab specimens and should not be used as a sole basis for treatment. Nasal washings and aspirates are unacceptable for Xpert Xpress SARS-CoV-2/FLU/RSV testing.  Fact Sheet for Patients: EntrepreneurPulse.com.au  Fact Sheet for Healthcare Providers: IncredibleEmployment.be  This test is not yet approved or cleared by the [place] FDA and has been authorized for detection and/or diagnosis of SARS-CoV-2 by FDA under an Emergency Use Authorization (EUA). This EUA will remain in effect (meaning this test can be used) for the duration of the COVID-19 declaration under Section 564(b)(1) of the Act, 21 U.S.C. section 360bbb-3(b)(1), unless the authorization is terminated or revoked.  Performed at [facility], [address], [place], [place] [address]      Labs: Basic Metabolic Panel: Recent Labs  Lab 06/09/21 1057 06/10/21 0700 06/11/21 0512  NA 127* 129* 127*  K 4.3 4.3 4.3  CL 96* 99 97*  CO2 _0 GLUCOSE 181* 138* 121*  BUN 48* 42* 39*  CREATININE 1.65* 1.53* 1.50*  CALCIUM 7.8* 7.9* 7.8*   Liver Function Tests: Recent Labs  Lab 06/09/21 1057  AST 27  ALT 16  ALKPHOS 165*  BILITOT 1.2  PROT 5.7*  ALBUMIN 1.7*   Recent Labs  Lab 06/09/21 1057  LIPASE 69*   No results for input(s): AMMONIA in the last 168 hours. CBC: Recent Labs  Lab 06/09/21 1057 06/10/21 0700 06/11/21 0512  WBC 8.3 7.7 7.6  NEUTROABS 6.7  --   --   HGB 9.7* 9.8* 8.8*  HCT 30.7* 31.7* 27.8*  MCV 86.0 85.9 85.3  PLT 217 216 200   Cardiac Enzymes: No results for input(s): CKTOTAL, CKMB, CKMBINDEX, TROPONINI in the last 168 hours. BNP: Invalid input(s): POCBNP CBG: Recent Labs  Lab 06/09/21 0927 06/11/21 0651 06/12/21 0414  GLUCAP 117* 140* 148*    Time coordinating discharge:  36 minutes  Signed:  Orson Eva, DO [facility] Pager: [phone] 06/12/2021, 7:07 AM

## 2021-06-12 NOTE — TOC Transition Note (Signed)
Transition of Care [facility]) - CM/SW Discharge Note   Patient Details  Name: Crystal [name] MRN: [id number] Date of Birth: 1950/11/18  Transition of Care [facility]) CM/SW Contact:  Natasha Bence, LCSW Phone Number: 06/12/2021, 11:58 AM   Clinical Narrative:    CSW notified [facility] not able to transport patient. CSW contacted [place], and [facility]. Neither are able to provide transportation for patient. CSW contacted [facility]. [facility] agreeable to transport patient but reported it would be several hours. CSW agreeable to place patient on transport list. Med necessity updated. TOC signing off.    Final next level of care: [place] Barriers to Discharge: Barriers Resolved   Patient Goals and CMS Choice Patient states their goals for this hospitalization and ongoing recovery are:: return home with [facility] CMS Medicare.gov Compare Post Acute Care list provided to:: Patient Choice offered to / list presented to : Patient  Discharge Placement                Patient to be transferred to facility by: [facility]   Patient and family notified of of transfer: 06/12/21  Discharge Plan and Services In-house Referral: Clinical Social Work Discharge Planning Services: CM Consult Post Acute Care Choice: Home Health                    HH Arranged: RN, PT [facility] Agency: [place] Date Ward: 06/11/21 Time [place]: 1234 Representative spoke with at [place]: [place] Determinants of Health ([place]) Interventions     Readmission Risk Interventions Readmission Risk Prevention Plan 04/28/2021 04/07/2021  Transportation Screening Complete Complete  Medication Review Press photographer) Complete Complete  PCP or Specialist appointment within 3-5 days of discharge - Complete  HRI or [place] Complete Complete  SW Recovery Care/Counseling Consult - Complete  Palliative Care Screening Not Applicable Not [place] Not  Applicable Not Applicable  Some recent data might be hidden

## 2021-06-12 NOTE — Progress Notes (Signed)
Received call from EMS that they are on the way to transfer pt home.  RN called to verify there is someone at home to receive patient.  PT husband [name] is at home and notified that pt will be arriving shortly, d/c orders in, will d/c pt to home.

## 2021-06-14 ENCOUNTER — Other Ambulatory Visit: Payer: Self-pay | Admitting: Gastroenterology

## 2021-06-14 ENCOUNTER — Other Ambulatory Visit: Payer: Self-pay | Admitting: Internal Medicine

## 2021-06-14 ENCOUNTER — Other Ambulatory Visit: Payer: Self-pay | Admitting: Family

## 2021-06-14 DIAGNOSIS — I5032 Chronic diastolic (congestive) heart failure: Secondary | ICD-10-CM

## 2021-06-14 DIAGNOSIS — R635 Abnormal weight gain: Secondary | ICD-10-CM

## 2021-06-14 DIAGNOSIS — M25475 Effusion, left foot: Secondary | ICD-10-CM

## 2021-06-14 DIAGNOSIS — R609 Edema, unspecified: Secondary | ICD-10-CM

## 2021-06-14 NOTE — Telephone Encounter (Signed)
Tried to call patient but unable to leave a message sent mychart message

## 2021-06-14 NOTE — Progress Notes (Deleted)
Cardiology Office Note:    Date:  06/14/2021   ID:  Crystal [name], DOB August 12, 1951, MRN [id number]  PCP:  Caprice Renshaw, MD  [facility] Cardiologist:  Nelva Bush, MD  [facility] Electrophysiologist:  None   Referring MD: Crystal [name], [place]   Chief Complaint: 2-3 week follow-up  History of Present Illness:    Crystal [name] is a 70 y.o. female with a hx of NASH liver, cirrhosis, esophageal varices s/p TIPS procedure, diastolic CHF, DVTs in the lower extremities, upper GIB, CAD, Raynaud's phenomenon.   Seen 03/2019 at which time she complained of continued leg edema. RHC was planned but was canceled as she was admitted for confusion due to hepatic encephalopathy and found to have acute right popliteal DVT and chronic left common femoral DVT. She was started on Eliquis. 3 weeks later she was admitted for GIB. EGD showed multiple areas of angioctasia in the stomach treated with laser therapy. A/c was held and IVC filter was placed.  Last seen 05/11/21 and reported worsening leg edema. She was on lasix 72m BID and spironolactone 1037mdaily. She was started on metolazone 40m57maily. RHC was scheduled. RHC showed normal left heart and pulmonary artery pressures, felt LLE not heart failure driven. Metolazone was decreased to 40mg44mery other day.   Hospitalized 7/11-7/22 for distal nondisplaced right femur fracture, hepatic encephalopathy, UTI  Admitted 7/28-7/31 for splenic infarct and conservative management was recommended. She was started on ASA 81mg84mly. She denied d/c to SNF.   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   Fatty  (change of) liver, not elsewhere classified    Full dentures    UPPER AND LOWER   Iron deficiency anemia due to chronic blood loss 02/18/2018   Lung nodule, multiple    Pernicious anemia 02/22/2018   PUD (peptic ulcer disease)    Raynaud's syndrome    Upper GI bleeding 08/03/2018    Past Surgical History:  Procedure Laterality Date   CATARACT EXTRACTION W/PHACO Right 02/06/2018   Procedure: CATARACT EXTRACTION PHACO AND INTRAOCULAR LENS PLACEMENT (IOC) [facility];  Surgeon: BrasiLeandrew Koyanagi  [place];  Laterality: Right;  DIABETIC, ORAL MED   CATARACT EXTRACTION W/PHACO Left 04/30/2018   Procedure: CATARACT EXTRACTION PHACO AND INTRAOCULAR LENS PLACEMENT (IOC);  Surgeon: BrasiLeandrew Koyanagi  Location: [facility];  Service: Ophthalmology;  Laterality: Left;  US 01[place]4 AP% 21.3 CDE 13.66 Fluid pack lot # [id number] WITH PROPOFOL N/A 03/25/2018   Procedure: COLONOSCOPY WITH PROPOFOL;  Surgeon: VangaLin Landsman  [facility];  Service: Gastroenterology;  Laterality: N/A;   DENTAL SURGERY     EXTRACTIONS   ELECTROMAGNETIC NAVIGATION BROCHOSCOPY Right 01/27/2019   Procedure: ELECTROMAGNETIC NAVIGATION BRONCHOSCOPY;  Surgeon: Kasa,[name]  Location: [facility];  Service: Cardiopulmonary;  Laterality: Right;   ENTEROSCOPY N/A 07/28/2020   Procedure: Push ENTEROSCOPY;  Surgeon: VangaLin Landsman  [facility];  Service: Gastroenterology;  Laterality: N/A;  ESOPHAGOGASTRODUODENOSCOPY N/A 08/03/2018   Procedure: ESOPHAGOGASTRODUODENOSCOPY (EGD);  Surgeon: Lin Landsman, MD;  Location: [facility] ENDOSCOPY;  Service: Gastroenterology;  Laterality: N/A;   ESOPHAGOGASTRODUODENOSCOPY (EGD) WITH PROPOFOL N/A 02/10/2018   Procedure: ESOPHAGOGASTRODUODENOSCOPY (EGD) WITH PROPOFOL;  Surgeon: Jonathon Bellows, MD;  Location: [facility] ENDOSCOPY;  Service: Gastroenterology;  Laterality: N/A;    ESOPHAGOGASTRODUODENOSCOPY (EGD) WITH PROPOFOL N/A 03/25/2018   Procedure: ESOPHAGOGASTRODUODENOSCOPY (EGD) WITH PROPOFOL;  Surgeon: Lin Landsman, MD;  Location: [facility] ENDOSCOPY;  Service: Gastroenterology;  Laterality: N/A;   ESOPHAGOGASTRODUODENOSCOPY (EGD) WITH PROPOFOL N/A 04/22/2018   Procedure: ESOPHAGOGASTRODUODENOSCOPY (EGD) WITH PROPOFOL with band ligation;  Surgeon: Lin Landsman, MD;  Location: [place];  Service: Gastroenterology;  Laterality: N/A;   ESOPHAGOGASTRODUODENOSCOPY (EGD) WITH PROPOFOL N/A 06/24/2018   Procedure: ESOPHAGOGASTRODUODENOSCOPY (EGD) WITH PROPOFOL;  Surgeon: Lin Landsman, MD;  Location: [facility] ENDOSCOPY;  Service: Gastroenterology;  Laterality: N/A;   ESOPHAGOGASTRODUODENOSCOPY (EGD) WITH PROPOFOL N/A 11/19/2018   Procedure: ESOPHAGOGASTRODUODENOSCOPY (EGD) WITH PROPOFOL;  Surgeon: Lin Landsman, MD;  Location: [place];  Service: Gastroenterology;  Laterality: N/A;   ESOPHAGOGASTRODUODENOSCOPY (EGD) WITH PROPOFOL N/A 03/08/2020   Procedure: ESOPHAGOGASTRODUODENOSCOPY (EGD) WITH PROPOFOL;  Surgeon: Lin Landsman, MD;  Location: [facility] ENDOSCOPY;  Service: Gastroenterology;  Laterality: N/A;   ESOPHAGOGASTRODUODENOSCOPY (EGD) WITH PROPOFOL N/A 04/28/2021   Procedure: ESOPHAGOGASTRODUODENOSCOPY (EGD) WITH PROPOFOL;  Surgeon: Lin Landsman, MD;  Location: [facility] ENDOSCOPY;  Service: Gastroenterology;  Laterality: N/A;   EYE SURGERY     GIVENS CAPSULE STUDY N/A 03/29/2020   Procedure: GIVENS CAPSULE STUDY;  Surgeon: Lin Landsman, MD;  Location: [facility] ENDOSCOPY;  Service: Gastroenterology;  Laterality: N/A;   HEMORRHOID BANDING  03/25/2018   Procedure: HEMORRHOID BANDING;  Surgeon: Lin Landsman, MD;  Location: [facility] ENDOSCOPY;  Service: Gastroenterology;;   IR EMBO ART  VEN HEMORR LYMPH EXTRAV  INC GUIDE ROADMAPPING  08/07/2018   IR RADIOLOGIST EVAL & MGMT  09/03/2018   IR RADIOLOGIST EVAL & MGMT  12/17/2018   IR RADIOLOGIST  EVAL & MGMT  06/24/2019   IR RADIOLOGIST EVAL & MGMT  01/22/2020   IR RADIOLOGIST EVAL & MGMT  02/09/2021   IR TIPS  08/07/2018   IR TRANSHEPATIC PORTOGRAM W HEMO  03/07/2021   IR US GUIDE VASC ACCESS RIGHT  03/07/2021   IR VENO/JUGULAR LEFT  03/07/2021   IVC FILTER INSERTION N/A 04/28/2021   Procedure: IVC FILTER INSERTION;  Surgeon: Algernon Huxley, MD;  Location: [place] CV LAB;  Service: Cardiovascular;  Laterality: N/A;   PORTA CATH INSERTION N/A 07/30/2020   Procedure: PORTA CATH INSERTION;  Surgeon: Katha Cabal, MD;  Location: [place] CV LAB;  Service: Cardiovascular;  Laterality: N/A;   RADIOLOGY WITH ANESTHESIA N/A 08/07/2018   Procedure: TIPS;  Surgeon: Corrie Mckusick, DO;  Location: [place];  Service: Anesthesiology;  Laterality: N/A;   RIGHT HEART CATH N/A 05/20/2021   Procedure: RIGHT HEART CATH;  Surgeon: Nelva Bush, MD;  Location: [place] CV LAB;  Service: Cardiovascular;  Laterality: N/A;   TONSILLECTOMY      Current Medications: No outpatient medications have been marked as taking for the 06/15/21 encounter (Appointment) with Kathlen Mody, Courney Garrod H, PA-C.     Allergies:   Patient has no known allergies.   Social History   Socioeconomic History   Marital status: Single    Spouse name: Not on file   Number of children: Not on file   Years of education: Not on file   Highest education level: Not on file  Occupational History   Not on file  Tobacco Use   Smoking status: Never   Smokeless tobacco: Never  Vaping Use   Vaping Use: Never used  Substance and Sexual Activity   Alcohol use: Not Currently    Comment: No EtOH for 30 years.  Never a heavy drinker.   Drug use: Never   Sexual activity: Not Currently  Other Topics Concern   Not on file  Social History Narrative   Independent at baseline. Lives at home with family   Social Determinants of Health   Financial Resource Strain: Not on file  Food Insecurity: No Food Insecurity   Worried About  Charity fundraiser in the Last Year: Never true   Arboriculturist in the Last Year: Never true  Transportation Needs: No Transportation Needs   Lack of Transportation (Medical): No   Lack of Transportation (Non-Medical): No  Physical Activity: Not on file  Stress: Not on file  Social Connections: Not on file     Family History: The patient's ***family history includes CAD in her father; Cancer in her maternal uncle; Heart attack (age of onset: 58) in her father; Seizures in her mother.  ROS:   Please see the history of present illness.    *** All other systems reviewed and are negative.  EKGs/Labs/Other Studies Reviewed:    The following studies were reviewed today:  Echo 06/10/21  1. Left ventricular ejection fraction, by estimation, is 60 to 65%. The  left ventricle has normal function. The left ventricle has no regional  wall motion abnormalities. There is moderate left ventricular hypertrophy.   2. The aortic valve is tricuspid.   3. The inferior vena cava is normal in size with greater than 50%  respiratory variability, suggesting right atrial pressure of 3 mmHg.   4. Limited echo for splenic infarct, evaluate potential thromboembolic  source. No evidence of intracardiac course from this limtied echo.   [place] 05/20/21 Conclusions: Normal left heart and pulmonary artery pressures. Mildly elevated right heart filling pressures. Normal cardiac output/index.   Recommendations: Chronic lower extremity edema does not appear to be driven primarily by heart failure. Continue current doses of furosemide and spironolactone.  Decrease metolazone to 5 mg every other day to lessen risk for worsening renal insufficiency. Further management per hepatology and nephrology, as chronic edema favored to be due to chronic liver and kidney disease.   Nelva Bush, MD [facility]   EKG:  EKG is *** ordered today.  The ekg ordered today demonstrates ***  Recent Labs: 04/27/2021: B  Natriuretic Peptide 314.1 05/25/2021: TSH 4.707 06/03/2021: Magnesium 2.8 06/09/2021: ALT 16 06/11/2021: BUN 39; Creatinine, Ser 1.50; Hemoglobin 8.8; Platelets 200; Potassium 4.3; Sodium 127  Recent Lipid Panel    Component Value Date/Time   TRIG 163 (H) 08/03/2018 2152     Risk Assessment/Calculations:   {Does this patient have ATRIAL FIBRILLATION?[phone]}   Physical Exam:    VS:  LMP  (LMP Unknown)     Wt Readings from Last 3 Encounters:  06/09/21 192 lb 0.3 oz (87.1 kg)  06/05/21 185 lb (83.9 kg)  05/29/21 185 lb 13.6 oz (84.3 kg)     GEN: *** Well nourished, well developed in no acute distress HEENT: Normal NECK: No JVD; No carotid bruits LYMPHATICS: No lymphadenopathy CARDIAC: ***RRR, no murmurs, rubs, gallops RESPIRATORY:  Clear to auscultation without rales, wheezing or rhonchi  ABDOMEN: Soft, non-tender, non-distended MUSCULOSKELETAL:  No edema; No deformity  SKIN: Warm and  dry NEUROLOGIC:  Alert and oriented x 3 PSYCHIATRIC:  Normal affect   ASSESSMENT:    No diagnosis found. PLAN:    In order of problems listed above:  ***  Disposition: Follow up {follow up:15908} with ***   Shared Decision Making/Informed Consent   {Are you ordering a CV Procedure (e.g. stress test, cath, DCCV, TEE, etc)?   Press F2        :[id number]}    Signed, Kasha Howeth Ninfa Meeker, PA-C  06/14/2021 9:28 AM    [facility]

## 2021-06-15 ENCOUNTER — Ambulatory Visit: Payer: Medicare HMO | Admitting: Medical

## 2021-06-17 LAB — PROTHROMBIN GENE MUTATION

## 2021-06-17 LAB — FACTOR 5 LEIDEN

## 2021-06-23 ENCOUNTER — Encounter: Payer: Self-pay | Admitting: *Deleted

## 2021-06-23 ENCOUNTER — Other Ambulatory Visit: Payer: Self-pay | Admitting: *Deleted

## 2021-06-23 NOTE — Patient Outreach (Addendum)
[facility]) Care Management  [place]  06/23/2021   Crystal [name] 1951-07-02 [id number]   Member's case discussed with multidisciplinary team due to multiple readmissions (6 in the last 6 month).  Noted that member was to discharge back to [facility] but refused and went home instead.     Outreach attempt #1, successful to member.  Identity verified.  This care manager introduced self and stated purpose of call.  [facility] care management services explained.  Permission granted to speak with daughter Crystal [name].  Social: Member was living alone but now daughter is staying with her during recovery.   Crystal [name] confirms that member was discharged home against medical advice/recommendations to return to SNF.  State she has since been "playing catch up" to get member what's needed to care for her in the home.  Home Health has started through [place] for SW, PT, OT, and nursing.  Referral has also been placed to have Crystal [name] come to the home for assessment in home aide needs.  In addition to this, member has also been connected to [facility] and has nursing visit from them.    Report member is currently bed bound, in hospital bed, awaiting mattress overlay for multiple pressure wounds.  Waiting for transfer board, wheelchair, BSC, and shower chair.  State Crystal [name] has already requested all DME.  Daughter is very Patent attorney of the help received from [place] since discharge.  Conditions: Per chart, has history of splenic infarct, fall with femur fractur, B12 deficiency, CKD, Pressure ulcer, DM, CAD, GAVE, CHF, Hepatitis C, and liver cirrhosis.  Medications: Reviewed with daughter, state she is helping with management and administration of meds.  Denies concern regarding affordability.    Appointments:  Daughter report member is currently bed bound, unable to get out to MD visits.  She now have a new PCP, Crystal Jump, NP through [facility]  who makes home visits. Home visit to establish care has been done in the last 2 weeks.  Encounter Medications:  Outpatient Encounter Medications as of 06/23/2021  Medication Sig Note   acetaminophen (TYLENOL) 500 MG tablet Take 1 tablet (500 mg total) by mouth every 6 (six) hours as needed for mild pain, moderate pain, fever or headache.    aspirin EC 81 MG EC tablet Take 1 tablet (81 mg total) by mouth daily. Swallow whole.    furosemide (LASIX) 80 MG tablet Take 0.5 tablets (40 mg total) by mouth daily.    gabapentin (NEURONTIN) 100 MG capsule Take 100 mg by mouth daily. For 3 days    hydrOXYzine (ATARAX/VISTARIL) 25 MG tablet Take 1 tablet (25 mg total) by mouth 2 (two) times daily as needed.    lactulose (CHRONULAC) 10 GM/15ML solution TAKE 15 MLS (10 G TOTAL) BY MOUTH 3 (THREE) TIMES DAILY.    ondansetron (ZOFRAN) 4 MG tablet Take 4 mg by mouth every 8 (eight) hours as needed for nausea or vomiting.    potassium chloride SA (KLOR-CON) 20 MEQ tablet Take 1 tablet (20 mEq total) by mouth daily.    spironolactone (ALDACTONE) 100 MG tablet TAKE 1 TABLET BY MOUTH TWICE A DAY    Vitamin D, Ergocalciferol, (DRISDOL) 1.25 MG (50000 UNIT) CAPS capsule Take 50,000 Units by mouth every Sunday. 06/09/2021: Sundays    metolazone (ZAROXOLYN) 5 MG tablet Take 1 tablet (5 mg total) by mouth every other day. (Patient not taking: Reported on 06/23/2021)    pantoprazole (PROTONIX) 40 MG tablet Take 1  tablet (40 mg total) by mouth 2 (two) times daily.    spironolactone (ALDACTONE) 50 MG tablet Take 1 tablet (50 mg total) by mouth daily. (Patient not taking: Reported on 06/23/2021)    Facility-Administered Encounter Medications as of 06/23/2021  Medication   sodium chloride flush (NS) 0.9 % injection 10 mL    Functional Status:  In your present state of health, do you have any difficulty performing the following activities: 06/09/2021 05/24/2021  Hearing? N N  Vision? Y Y  Difficulty concentrating or making  decisions? N N  Walking or climbing stairs? Y N  Comment - -  Dressing or bathing? Y N  Doing errands, shopping? N N  Preparing Food and eating ? - -  Using the Toilet? - -  In the past six months, have you accidently leaked urine? - -  Do you have problems with loss of bowel control? - -  Managing your Medications? - -  Managing your Finances? - -  Housekeeping or managing your Housekeeping? - -  Comment - -  Some recent data might be hidden    Fall/Depression Screening: Fall Risk  05/02/2021 04/18/2021 07/24/2018  Falls in the past year? 0 0 No   PHQ 2/9 Scores 04/18/2021  PHQ - 2 Score 0     Plan:  Follow-up: Will verify with CMA that member is eligible for services since she now has new non-[facility] PCP.  If eligible, will update assigned RNCM to follow up within the next 2 weeks to complete initial assessment and start individualized care plan.  Valente David, [place], MSN [place] [phone]

## 2021-06-26 ENCOUNTER — Inpatient Hospital Stay
Admission: EM | Admit: 2021-06-26 | Discharge: 2021-06-29 | DRG: 682 | Disposition: A | Discharge status: Hospice | Payer: Medicare HMO | Attending: Hospitalist | Admitting: Hospitalist

## 2021-06-26 ENCOUNTER — Other Ambulatory Visit: Payer: Self-pay

## 2021-06-26 ENCOUNTER — Observation Stay: Payer: Medicare HMO

## 2021-06-26 DIAGNOSIS — S72401A Unspecified fracture of lower end of right femur, initial encounter for closed fracture: Secondary | ICD-10-CM | POA: Diagnosis present

## 2021-06-26 DIAGNOSIS — L8990 Pressure ulcer of unspecified site, unspecified stage: Secondary | ICD-10-CM | POA: Diagnosis not present

## 2021-06-26 DIAGNOSIS — N390 Urinary tract infection, site not specified: Secondary | ICD-10-CM | POA: Diagnosis present

## 2021-06-26 DIAGNOSIS — G9340 Encephalopathy, unspecified: Secondary | ICD-10-CM | POA: Diagnosis present

## 2021-06-26 DIAGNOSIS — B1920 Unspecified viral hepatitis C without hepatic coma: Secondary | ICD-10-CM | POA: Diagnosis present

## 2021-06-26 DIAGNOSIS — N183 Chronic kidney disease, stage 3 unspecified: Secondary | ICD-10-CM | POA: Diagnosis present

## 2021-06-26 DIAGNOSIS — Z86718 Personal history of other venous thrombosis and embolism: Secondary | ICD-10-CM

## 2021-06-26 DIAGNOSIS — D729 Disorder of white blood cells, unspecified: Secondary | ICD-10-CM

## 2021-06-26 DIAGNOSIS — R0602 Shortness of breath: Secondary | ICD-10-CM

## 2021-06-26 DIAGNOSIS — R4182 Altered mental status, unspecified: Secondary | ICD-10-CM | POA: Diagnosis not present

## 2021-06-26 DIAGNOSIS — Z515 Encounter for palliative care: Secondary | ICD-10-CM

## 2021-06-26 DIAGNOSIS — K7460 Unspecified cirrhosis of liver: Secondary | ICD-10-CM

## 2021-06-26 DIAGNOSIS — N184 Chronic kidney disease, stage 4 (severe): Secondary | ICD-10-CM | POA: Diagnosis not present

## 2021-06-26 DIAGNOSIS — N1831 Chronic kidney disease, stage 3a: Secondary | ICD-10-CM

## 2021-06-26 DIAGNOSIS — E119 Type 2 diabetes mellitus without complications: Secondary | ICD-10-CM

## 2021-06-26 DIAGNOSIS — I8510 Secondary esophageal varices without bleeding: Secondary | ICD-10-CM | POA: Diagnosis present

## 2021-06-26 DIAGNOSIS — I2510 Atherosclerotic heart disease of native coronary artery without angina pectoris: Secondary | ICD-10-CM | POA: Diagnosis present

## 2021-06-26 DIAGNOSIS — Z7982 Long term (current) use of aspirin: Secondary | ICD-10-CM

## 2021-06-26 DIAGNOSIS — Z20822 Contact with and (suspected) exposure to covid-19: Secondary | ICD-10-CM | POA: Diagnosis present

## 2021-06-26 DIAGNOSIS — K922 Gastrointestinal hemorrhage, unspecified: Secondary | ICD-10-CM | POA: Diagnosis present

## 2021-06-26 DIAGNOSIS — K7581 Nonalcoholic steatohepatitis (NASH): Secondary | ICD-10-CM

## 2021-06-26 DIAGNOSIS — E871 Hypo-osmolality and hyponatremia: Secondary | ICD-10-CM | POA: Diagnosis present

## 2021-06-26 DIAGNOSIS — B9620 Unspecified Escherichia coli [E. coli] as the cause of diseases classified elsewhere: Secondary | ICD-10-CM | POA: Diagnosis present

## 2021-06-26 DIAGNOSIS — N179 Acute kidney failure, unspecified: Principal | ICD-10-CM | POA: Diagnosis present

## 2021-06-26 DIAGNOSIS — E1122 Type 2 diabetes mellitus with diabetic chronic kidney disease: Secondary | ICD-10-CM | POA: Diagnosis present

## 2021-06-26 DIAGNOSIS — L089 Local infection of the skin and subcutaneous tissue, unspecified: Secondary | ICD-10-CM

## 2021-06-26 DIAGNOSIS — K7469 Other cirrhosis of liver: Secondary | ICD-10-CM | POA: Diagnosis present

## 2021-06-26 DIAGNOSIS — I7300 Raynaud's syndrome without gangrene: Secondary | ICD-10-CM | POA: Diagnosis present

## 2021-06-26 DIAGNOSIS — D500 Iron deficiency anemia secondary to blood loss (chronic): Secondary | ICD-10-CM | POA: Diagnosis present

## 2021-06-26 DIAGNOSIS — L89319 Pressure ulcer of right buttock, unspecified stage: Secondary | ICD-10-CM | POA: Diagnosis present

## 2021-06-26 DIAGNOSIS — Z79899 Other long term (current) drug therapy: Secondary | ICD-10-CM

## 2021-06-26 DIAGNOSIS — G9341 Metabolic encephalopathy: Secondary | ICD-10-CM | POA: Diagnosis present

## 2021-06-26 DIAGNOSIS — Z66 Do not resuscitate: Secondary | ICD-10-CM | POA: Diagnosis present

## 2021-06-26 DIAGNOSIS — L03115 Cellulitis of right lower limb: Secondary | ICD-10-CM | POA: Diagnosis present

## 2021-06-26 DIAGNOSIS — Z95828 Presence of other vascular implants and grafts: Secondary | ICD-10-CM

## 2021-06-26 DIAGNOSIS — I5032 Chronic diastolic (congestive) heart failure: Secondary | ICD-10-CM | POA: Diagnosis present

## 2021-06-26 DIAGNOSIS — R768 Other specified abnormal immunological findings in serum: Secondary | ICD-10-CM | POA: Diagnosis present

## 2021-06-26 DIAGNOSIS — S31000A Unspecified open wound of lower back and pelvis without penetration into retroperitoneum, initial encounter: Secondary | ICD-10-CM | POA: Diagnosis present

## 2021-06-26 DIAGNOSIS — Z8249 Family history of ischemic heart disease and other diseases of the circulatory system: Secondary | ICD-10-CM

## 2021-06-26 DIAGNOSIS — K7290 Hepatic failure, unspecified without coma: Secondary | ICD-10-CM | POA: Diagnosis present

## 2021-06-26 DIAGNOSIS — D735 Infarction of spleen: Secondary | ICD-10-CM | POA: Diagnosis present

## 2021-06-26 LAB — URINALYSIS, COMPLETE (UACMP) WITH MICROSCOPIC
Bilirubin Urine: NEGATIVE
Glucose, UA: NEGATIVE mg/dL
Hgb urine dipstick: NEGATIVE
Ketones, ur: NEGATIVE mg/dL
Nitrite: NEGATIVE
Protein, ur: NEGATIVE mg/dL
Specific Gravity, Urine: 1.009 (ref 1.005–1.030)
WBC, UA: >50 WBC/hpf — ABNORMAL HIGH (ref 0–5)
pH: 6 (ref 5.0–8.0)

## 2021-06-26 LAB — CBC
HCT: 35.7 % — ABNORMAL LOW (ref 36.0–46.0)
Hemoglobin: 12.2 g/dL (ref 12.0–15.0)
MCH: 27.1 pg (ref 26.0–34.0)
MCHC: 34.2 g/dL (ref 30.0–36.0)
MCV: 79.3 fL — ABNORMAL LOW (ref 80.0–100.0)
Platelets: 235 10*3/uL (ref 150–400)
RBC: 4.5 MIL/uL (ref 3.87–5.11)
RDW: 19.1 % — ABNORMAL HIGH (ref 11.5–15.5)
WBC: 16.8 10*3/uL — ABNORMAL HIGH (ref 4.0–10.5)
nRBC: 0 % (ref 0.0–0.2)

## 2021-06-26 LAB — COMPREHENSIVE METABOLIC PANEL
ALT: 15 U/L (ref 0–44)
AST: 27 U/L (ref 15–41)
Albumin: 1.9 g/dL — ABNORMAL LOW (ref 3.5–5.0)
Alkaline Phosphatase: 209 U/L — ABNORMAL HIGH (ref 38–126)
Anion gap: 15 (ref 5–15)
BUN: 80 mg/dL — ABNORMAL HIGH (ref 8–23)
CO2: 24 mmol/L (ref 22–32)
Calcium: 8.4 mg/dL — ABNORMAL LOW (ref 8.9–10.3)
Chloride: 84 mmol/L — ABNORMAL LOW (ref 98–111)
Creatinine, Ser: 2.33 mg/dL — ABNORMAL HIGH (ref 0.44–1.00)
GFR, Estimated: 22 mL/min — ABNORMAL LOW (ref >60)
Glucose, Bld: 147 mg/dL — ABNORMAL HIGH (ref 70–99)
Potassium: 3.7 mmol/L (ref 3.5–5.1)
Sodium: 123 mmol/L — ABNORMAL LOW (ref 135–145)
Total Bilirubin: 1.9 mg/dL — ABNORMAL HIGH (ref 0.3–1.2)
Total Protein: 6.2 g/dL — ABNORMAL LOW (ref 6.5–8.1)

## 2021-06-26 LAB — RESP PANEL BY RT-PCR (FLU A&B, COVID) ARPGX2
Influenza A by PCR: NEGATIVE
Influenza B by PCR: NEGATIVE
SARS Coronavirus 2 by RT PCR: NEGATIVE

## 2021-06-26 LAB — PROCALCITONIN
Procalcitonin: 0.94 ng/mL
Procalcitonin: 0.93 ng/mL

## 2021-06-26 LAB — AMMONIA: Ammonia: 54 umol/L — ABNORMAL HIGH (ref 9–35)

## 2021-06-26 LAB — LACTIC ACID, PLASMA
Lactic Acid, Venous: 2.6 mmol/L (ref 0.5–1.9)
Lactic Acid, Venous: 3.2 mmol/L (ref 0.5–1.9)

## 2021-06-26 MED ORDER — MORPHINE SULFATE (PF) 4 MG/ML IV SOLN
6.0000 mg | Freq: Once | INTRAVENOUS | Status: AC
Start: 2021-06-26 — End: 2021-06-26
  Administered 2021-06-26: 6 mg via INTRAVENOUS
  Filled 2021-06-26: qty 2

## 2021-06-26 MED ORDER — SODIUM CHLORIDE 0.9 % IV SOLN
1.0000 g | Freq: Once | INTRAVENOUS | Status: AC
  Administered 2021-06-26: 1 g via INTRAVENOUS
  Filled 2021-06-26: qty 10

## 2021-06-26 MED ORDER — HYDROXYZINE HCL 25 MG PO TABS: 25.0000 mg | ORAL_TABLET | Freq: Three times a day (TID) | ORAL | Status: DC | PRN

## 2021-06-26 MED ORDER — LACTATED RINGERS IV SOLN: INTRAVENOUS | Status: AC

## 2021-06-26 MED ORDER — ONDANSETRON HCL 4 MG/2ML IJ SOLN: 4.0000 mg | Freq: Four times a day (QID) | INTRAMUSCULAR | Status: DC | PRN

## 2021-06-26 MED ORDER — LACTATED RINGERS IV BOLUS
1000.0000 mL | Freq: Once | INTRAVENOUS | Status: AC
  Administered 2021-06-26: 1000 mL via INTRAVENOUS

## 2021-06-26 MED ORDER — POTASSIUM CHLORIDE CRYS ER 20 MEQ PO TBCR
20.0000 meq | EXTENDED_RELEASE_TABLET | Freq: Every day | ORAL | Status: DC
  Administered 2021-06-26 – 2021-06-29 (×4): 20 meq via ORAL
  Filled 2021-06-26 (×4): qty 1

## 2021-06-26 MED ORDER — SODIUM CHLORIDE 0.9 % IV SOLN: INTRAVENOUS | Status: AC

## 2021-06-26 MED ORDER — ONDANSETRON HCL 4 MG PO TABS: 4.0000 mg | ORAL_TABLET | Freq: Four times a day (QID) | ORAL | Status: DC | PRN

## 2021-06-26 MED ORDER — SPIRONOLACTONE 25 MG PO TABS
100.0000 mg | ORAL_TABLET | Freq: Two times a day (BID) | ORAL | Status: DC
  Administered 2021-06-27 – 2021-06-29 (×5): 100 mg via ORAL
  Filled 2021-06-26 (×5): qty 4

## 2021-06-26 MED ORDER — VANCOMYCIN HCL 1500 MG/300ML IV SOLN
1500.0000 mg | Freq: Once | INTRAVENOUS | Status: DC
  Filled 2021-06-26 (×3): qty 300

## 2021-06-26 MED ORDER — PANTOPRAZOLE SODIUM 40 MG PO TBEC
40.0000 mg | DELAYED_RELEASE_TABLET | Freq: Every day | ORAL | Status: DC
  Administered 2021-06-27 – 2021-06-29 (×3): 40 mg via ORAL
  Filled 2021-06-26 (×3): qty 1

## 2021-06-26 MED ORDER — LACTULOSE 10 GM/15ML PO SOLN
10.0000 g | Freq: Three times a day (TID) | ORAL | Status: DC
  Administered 2021-06-27 – 2021-06-28 (×4): 10 g via ORAL
  Filled 2021-06-26 (×4): qty 30

## 2021-06-26 MED ORDER — ACETAMINOPHEN 325 MG PO TABS
650.0000 mg | ORAL_TABLET | Freq: Four times a day (QID) | ORAL | Status: DC | PRN
  Administered 2021-06-26 – 2021-06-27 (×2): 650 mg via ORAL
  Filled 2021-06-26 (×2): qty 2

## 2021-06-26 MED ORDER — ACETAMINOPHEN 650 MG RE SUPP: 650.0000 mg | Freq: Four times a day (QID) | RECTAL | Status: DC | PRN

## 2021-06-26 MED ORDER — CEFTRIAXONE SODIUM 1 G IJ SOLR
1.0000 g | INTRAMUSCULAR | Status: AC
  Administered 2021-06-27 – 2021-06-28 (×2): 1 g via INTRAVENOUS
  Filled 2021-06-26 (×2): qty 1

## 2021-06-26 MED ORDER — ASPIRIN EC 81 MG PO TBEC
81.0000 mg | DELAYED_RELEASE_TABLET | Freq: Every day | ORAL | Status: DC
  Administered 2021-06-27 – 2021-06-29 (×3): 81 mg via ORAL
  Filled 2021-06-26 (×3): qty 1

## 2021-06-26 MED ORDER — AZITHROMYCIN 500 MG IV SOLR
500.0000 mg | INTRAVENOUS | Status: DC
  Administered 2021-06-27 – 2021-06-28 (×2): 500 mg via INTRAVENOUS
  Filled 2021-06-26 (×3): qty 500

## 2021-06-26 NOTE — ED Notes (Addendum)
MD Charna Archer notified of critical lactic of 2.6

## 2021-06-26 NOTE — H&P (Signed)
History and Physical   Crystal [name] OEU:[id number] DOB: 1951/08/02 DOA: 06/26/2021  PCP: Darrol Jump, NP  Outpatient Specialists: Dr. Saunders Revel, cardiologist Patient coming from: Home via EMS  I have personally briefly reviewed patient's old medical records in [place].  Chief Concern: Confusion/altered mentation  HPI: Crystal [name] is a 70 y.o. 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  At bedside patient was able to tell me her name, her age, she knows she is in the hospital.  She has a flat affect and looks depressed.  Upon discussion of resuscitative measures, and intubation she became more alert and adamantly expressed no to chest compressions and no to intubation should she become unable to breathe.  She denies fever, chest pain, shortness of breath, abdominal pain.  She endorses right lower extremity wound pain.  She states that she does not want to be hospitalized any longer.  She just wants to die.  She wants to talk to palliative to discuss her options.  She states that at this time she is okay with getting IV antibiotics and fluids however she does not want any procedures or invasive interventions.  Social history: Lives with long term boyfriend, Aurther Loft.   Vaccination history: Unknown  ROS: Constitutional: no weight change, no fever ENT/Mouth: no sore throat, no rhinorrhea Eyes: no eye pain, no vision changes Cardiovascular: no chest pain, no dyspnea,  no edema, no palpitations Respiratory: no cough, no sputum, no wheezing Gastrointestinal: no nausea, no vomiting, no diarrhea, no  constipation Genitourinary: no urinary incontinence, no dysuria, no hematuria Musculoskeletal: no arthralgias, no myalgias Skin: no skin lesions, no pruritus, Neuro: + weakness, no loss of consciousness, no syncope Psych: no anxiety, no depression, + decrease appetite Heme/Lymph: no bruising, no bleeding  ED Course: Discussed with emergency medicine provider, patient requiring hospitalization for chief concerns of urinary tract infection in setting of multiple sacral decubitus wound and possible cellulitis.  Vitals in the emergency department is remarkable for temperature 98.4, respiration rate of 14, heart rate 73, blood pressure 116/60, SPO2 of 100% on room air.  Labs in the emergency department was remarkable for sodium 123, potassium 3.7, chloride 84, bicarb 24, BUN of 80, serum creatinine of 2.33, nonfasting blood glucose 147, EGFR of 22.  WBC 16.8, hemoglobin 12.2, platelets 235.  UA showed large leukocytes.  Lactic acid was 2.6.  Ammonia was elevated at 54.  Assessment/Plan  Active Problems:   Iron deficiency anemia due to chronic blood loss   Anticentromere antibodies present   Hepatitis C virus infection without hepatic coma   Other cirrhosis of liver (HCC)   Type 2 diabetes mellitus with stage 3 chronic kidney disease (HCC)   S/P TIPS (transjugular intrahepatic portosystemic shunt)   Coronary artery disease involving native coronary artery of native heart without angina pectoris   Liver cirrhosis secondary to NASH [facility])   GI bleeding   Diabetes mellitus, type 2 (HCC)   Fracture of femur, distal, right, closed ([place])   Pressure injury of skin   Splenic infarct   CKD (chronic kidney disease) stage 4, GFR 15-29 ml/min (HCC)   Sacral wound   AKI (acute kidney injury) ([place])   # Confusion in setting  of multiple etiology including elevated ammonia, urinary tract infection, UTI, and sacral decubitus ulcer and possible cellulitis of the lower extremity - Elevated ammonia -  Patient did not meet sepsis criteria and only had elevated WBC and lactic acid, at this time no abnormal vitals - Continue broad-spectrum antibiotics - Add azithromycin for atypical coverage - Check procalcitonin is elevated - At bedside with EDP, patient was awake alert and oriented fully and wishes to be DNR - She also expressed the possible interest of comfort care as she has been dealing with multiple medical conditions and feels tired of the frequent hospitalization from illnesses - She expresses wish to consult and discuss comfort care options with palliative  # Urinary tract infection-ceftriaxone 1 g IV every 24 hours, 2 more doses ordered - Urine culture in process  # Acute kidney injury on CKD stage IIIb - Serum creatinine on presentation is 2.33/GFR 22 - Baseline serum creatinine ranges 1.50-1.65, EGFR 33-37 - Status post lactated ringer 1 L bolus - Normal saline IVF at 125 mL/h, 1 day ordered - BMP in the a.m.  # History of Reynauds disease-outpatient follow-up # Cirrhosis secondary to NASH-lactulose 3 times daily resumed # History of esophageal varices status post TIPS # Heart failure preserved ejection fraction-resume spironolactone 100 mg p.o. twice daily # Recent history of acute splenic infarct-outpatient  # Sacral pressure wounds - poa # Right lower extremity wound - poa  Extensive chart reviewed:   Hospitalization from 06/09/2021 to 06/12/2021- splenic infarct.  Due to her history of GI bleed, it was recommended that patient avoid anticoagulation, okay for aspirin 81 mg daily.  Patient likely has an underlying hypercoagulable state however etiology is unclear as this may be acquired or hereditary. -Complete echo was done on 06/10/2021 and showed no thrombi. -Patient refuses to go back to SNF, [place] facility in [facility]  Hospitalization from 05/23/2021 to 06/03/2021: Distal nondisplaced right femur fracture.  Patient was managed nonoperatively due to  significant perioperative risk.  Treated for hepatic encephalopathy with lactulose enemas.  UTI treated with IV ceftriaxone and transition to oral Augmentin.  Hospitalization from 04/27/2021-05/01/2021: Anemia due to GI blood loss.  Hemoglobin on presentation at this time was 5.9 required 2 units of packed red bloods.  She was treated with IV pantoprazole and octreotide.  EGD performed on 04/28/2021 showed single nonbleeding angiectasia in the stomach, severe diffuse antral vascular ectasia both treated with argon plasma coagulation.  She was also treated with aspiration pneumonia during this hospitalization.  History of DVT in May 2022-acute nonocclusive DVT in the right leg, chronic calcified DVT in the left leg.  Repeat ultrasound on 05/19/2021 demonstrated subacute DVT, and acute occlusive superficial thrombophlebitis of the right small saphenous vein.  Patient was started on anticoagulation with Eliquis which she took for a week before she presented to the emergency department in June 2022 with evidence of GI bleed.  Anticoagulation was discontinued.  IVC filter was placed by vascular surgeon, Dr. Lucky Cowboy on 04/28/2021.  DVT prophylaxis: Patient has an IVC filter Code Status: DNR/DNI Diet: Heart healthy Family Communication: Updated daughter, Melina Modena at [phone], daughter is the legal healthcare power of attorney Disposition Plan: Pending clinical course Consults called: Palliative care consulted via epic order Admission status: Progressive cardiac, observation, telemetry  Past Medical History:  Diagnosis Date   Acute GI bleeding 02/09/2018   Acute upper gastrointestinal bleeding 08/03/2018   Anemia    TAKES IRON TAB   Arthritis    SHOULDER  B12 deficiency 02/18/2018   Bleeding    GI  3/19   Bronchitis    HX OF   CHF (congestive heart failure) (HCC)    Cirrhosis (HCC)    Colon cancer screening    Diabetes mellitus without complication ([place])    TYPE 2   Fatty (change of) liver,  not elsewhere classified    Full dentures    UPPER AND LOWER   Iron deficiency anemia due to chronic blood loss 02/18/2018   Lung nodule, multiple    Pernicious anemia 02/22/2018   PUD (peptic ulcer disease)    Raynaud's syndrome    Upper GI bleeding 08/03/2018   Past Surgical History:  Procedure Laterality Date   CATARACT EXTRACTION W/PHACO Right 02/06/2018   Procedure: CATARACT EXTRACTION PHACO AND INTRAOCULAR LENS PLACEMENT ([place]) RIGHT DIABETIC;  Surgeon: Leandrew Koyanagi, MD;  Location: [place];  Service: Ophthalmology;  Laterality: Right;  DIABETIC, ORAL MED   CATARACT EXTRACTION W/PHACO Left 04/30/2018   Procedure: CATARACT EXTRACTION PHACO AND INTRAOCULAR LENS PLACEMENT ([facility]);  Surgeon: Leandrew Koyanagi, MD;  Location: [facility]S;  Service: Ophthalmology;  Laterality: Left;  [place] 01:04 AP% 21.3 CDE 13.66 Fluid pack lot # [id number]   COLONOSCOPY WITH PROPOFOL N/A 03/25/2018   Procedure: COLONOSCOPY WITH PROPOFOL;  Surgeon: Lin Landsman, MD;  Location: [facility] ENDOSCOPY;  Service: Gastroenterology;  Laterality: N/A;   DENTAL SURGERY     EXTRACTIONS   ELECTROMAGNETIC NAVIGATION BROCHOSCOPY Right 01/27/2019   Procedure: ELECTROMAGNETIC NAVIGATION BRONCHOSCOPY;  Surgeon: [name], MD;  Location: [facility];  Service: Cardiopulmonary;  Laterality: Right;   ENTEROSCOPY N/A 07/28/2020   Procedure: Push ENTEROSCOPY;  Surgeon: Lin Landsman, MD;  Location: [facility] ENDOSCOPY;  Service: Gastroenterology;  Laterality: N/A;   ESOPHAGOGASTRODUODENOSCOPY N/A 08/03/2018   Procedure: ESOPHAGOGASTRODUODENOSCOPY (EGD);  Surgeon: Lin Landsman, MD;  Location: [facility] ENDOSCOPY;  Service: Gastroenterology;  Laterality: N/A;   ESOPHAGOGASTRODUODENOSCOPY (EGD) WITH PROPOFOL N/A 02/10/2018   Procedure: ESOPHAGOGASTRODUODENOSCOPY (EGD) WITH PROPOFOL;  Surgeon: Jonathon Bellows, MD;  Location: [facility] ENDOSCOPY;  Service: Gastroenterology;  Laterality: N/A;   ESOPHAGOGASTRODUODENOSCOPY (EGD) WITH  PROPOFOL N/A 03/25/2018   Procedure: ESOPHAGOGASTRODUODENOSCOPY (EGD) WITH PROPOFOL;  Surgeon: Lin Landsman, MD;  Location: [facility]COPY;  Service: Gastroenterology;  Laterality: N/A;   ESOPHAGOGASTRODUODENOSCOPY (EGD) WITH PROPOFOL N/A 04/22/2018   Procedure: ESOPHAGOGASTRODUODENOSCOPY (EGD) WITH PROPOFOL with band ligation;  Surgeon: Lin Landsman, MD;  Location: [place];  Service: Gastroenterology;  Laterality: N/A;   ESOPHAGOGASTRODUODENOSCOPY (EGD) WITH PROPOFOL N/A 06/24/2018   Procedure: ESOPHAGOGASTRODUODENOSCOPY (EGD) WITH PROPOFOL;  Surgeon: Lin Landsman, MD;  Location: [facility] ENDOSCOPY;  Service: Gastroenterology;  Laterality: N/A;   ESOPHAGOGASTRODUODENOSCOPY (EGD) WITH PROPOFOL N/A 11/19/2018   Procedure: ESOPHAGOGASTRODUODENOSCOPY (EGD) WITH PROPOFOL;  Surgeon: Lin Landsman, MD;  Location: [place];  Service: Gastroenterology;  Laterality: N/A;   ESOPHAGOGASTRODUODENOSCOPY (EGD) WITH PROPOFOL N/A 03/08/2020   Procedure: ESOPHAGOGASTRODUODENOSCOPY (EGD) WITH PROPOFOL;  Surgeon: Lin Landsman, MD;  Location: [facility] ENDOSCOPY;  Service: Gastroenterology;  Laterality: N/A;   ESOPHAGOGASTRODUODENOSCOPY (EGD) WITH PROPOFOL N/A 04/28/2021   Procedure: ESOPHAGOGASTRODUODENOSCOPY (EGD) WITH PROPOFOL;  Surgeon: Lin Landsman, MD;  Location: [facility] ENDOSCOPY;  Service: Gastroenterology;  Laterality: N/A;   EYE SURGERY     GIVENS CAPSULE STUDY N/A 03/29/2020   Procedure: GIVENS CAPSULE STUDY;  Surgeon: Lin Landsman, MD;  Location: [facility] ENDOSCOPY;  Service: Gastroenterology;  Laterality: N/A;   HEMORRHOID BANDING  03/25/2018   Procedure: HEMORRHOID BANDING;  Surgeon: Lin Landsman, MD;  Location: [place] ENDOSCOPY;  Service: Gastroenterology;;   IR EMBO ART  VEN HEMORR LYMPH EXTRAV  INC GUIDE ROADMAPPING  08/07/2018   IR RADIOLOGIST EVAL & MGMT  09/03/2018   IR RADIOLOGIST EVAL & MGMT  12/17/2018   IR RADIOLOGIST EVAL & MGMT  06/24/2019   IR  RADIOLOGIST EVAL & MGMT  01/22/2020   IR RADIOLOGIST EVAL & MGMT  02/09/2021   IR TIPS  08/07/2018   IR TRANSHEPATIC PORTOGRAM W HEMO  03/07/2021   IR US GUIDE VASC ACCESS RIGHT  03/07/2021   IR VENO/JUGULAR LEFT  03/07/2021   IVC FILTER INSERTION N/A 04/28/2021   Procedure: IVC FILTER INSERTION;  Surgeon: Algernon Huxley, MD;  Location: [place] CV LAB;  Service: Cardiovascular;  Laterality: N/A;   PORTA CATH INSERTION N/A 07/30/2020   Procedure: PORTA CATH INSERTION;  Surgeon: Katha Cabal, MD;  Location: [place] CV LAB;  Service: Cardiovascular;  Laterality: N/A;   RADIOLOGY WITH ANESTHESIA N/A 08/07/2018   Procedure: TIPS;  Surgeon: Corrie Mckusick, DO;  Location: [place];  Service: Anesthesiology;  Laterality: N/A;   RIGHT HEART CATH N/A 05/20/2021   Procedure: RIGHT HEART CATH;  Surgeon: Nelva Bush, MD;  Location: [place] CV LAB;  Service: Cardiovascular;  Laterality: N/A;   TONSILLECTOMY     Social History:  reports that she has never smoked. She has never used smokeless tobacco. She reports that she does not currently use alcohol. She reports that she does not use drugs.  No Known Allergies Family History  Problem Relation Age of Onset   CAD Father    Heart attack Father 1   Cancer Maternal Uncle    Seizures Mother    Family history: Family history reviewed and pertinent for coronary artery disease in the father.  Prior to Admission medications   Medication Sig Start Date End Date Taking? Authorizing Provider  acetaminophen (TYLENOL) 500 MG tablet Take 1 tablet (500 mg total) by mouth every 6 (six) hours as needed for mild pain, moderate pain, fever or headache. 06/02/21   Val Riles, MD  aspirin EC 81 MG EC tablet Take 1 tablet (81 mg total) by mouth daily. Swallow whole. 06/12/21   Orson Eva, MD  furosemide (LASIX) 80 MG tablet Take 0.5 tablets (40 mg total) by mouth daily. 06/02/21   Val Riles, MD  gabapentin (NEURONTIN) 100 MG capsule Take 100 mg by mouth  daily. For 3 days    [provider]  hydrOXYzine (ATARAX/VISTARIL) 25 MG tablet Take 1 tablet (25 mg total) by mouth 2 (two) times daily as needed. 04/08/21   Kathie Dike, MD  lactulose (CHRONULAC) 10 GM/15ML solution TAKE 15 MLS (10 G TOTAL) BY MOUTH 3 (THREE) TIMES DAILY. 06/14/21   Lin Landsman, MD  metolazone (ZAROXOLYN) 5 MG tablet Take 1 tablet (5 mg total) by mouth every other day. Patient not taking: Reported on 06/23/2021 05/20/21 09/17/21  End, Harrell Gave, MD  ondansetron (ZOFRAN) 4 MG tablet Take 4 mg by mouth every 8 (eight) hours as needed for nausea or vomiting.    [provider]  pantoprazole (PROTONIX) 40 MG tablet Take 1 tablet (40 mg total) by mouth 2 (two) times daily. 05/01/21 06/09/21  Sidney Ace, MD  potassium chloride SA (KLOR-CON) 20 MEQ tablet Take 1 tablet (20 mEq total) by mouth daily. 06/02/21 09/30/21  Val Riles, MD  spironolactone (ALDACTONE) 100 MG tablet TAKE 1 TABLET BY MOUTH TWICE A DAY 06/14/21   Lin Landsman, MD  spironolactone (ALDACTONE) 50  MG tablet Take 1 tablet (50 mg total) by mouth daily. Patient not taking: Reported on 06/23/2021 06/02/21   Val Riles, MD  Vitamin D, Ergocalciferol, (DRISDOL) 1.25 MG (50000 UNIT) CAPS capsule Take 50,000 Units by mouth every Sunday. 01/14/21   [provider]   Physical Exam: Vitals:   06/26/21 1953 06/26/21 2001 06/26/21 2012 06/26/21 2335  BP: (!) 106/54   120/64  Pulse:    72  Resp: 18     Temp:      TempSrc:      SpO2: (!) 76% 94% 99%   Weight:      Height:       Constitutional: appears age-appropriate, frail, NAD, calm, comfortable Eyes: PERRL, lids and conjunctivae normal ENMT: Mucous membranes are moist. Posterior pharynx clear of any exudate or lesions. Age-appropriate dentition.  Mild hearing loss Neck: normal, supple, no masses, no thyromegaly Respiratory: clear to auscultation bilaterally, no wheezing, no crackles. Normal respiratory effort. No  accessory muscle use.  Cardiovascular: Regular rate and rhythm, no murmurs / rubs / gallops. No extremity edema. 2+ pedal pulses. No carotid bruits.  Abdomen: Obese abdomen, no tenderness, no masses palpated, no hepatosplenomegaly. Bowel sounds positive.  Musculoskeletal: no clubbing / cyanosis. No joint deformity upper and lower extremities. Good ROM, no contractures, no atrophy. Normal muscle tone.  Skin: wounds present on admission.   Neurologic: Sensation intact. Strength 5/5 in all 4.  Psychiatric: Normal judgment and insight. Alert and oriented x 3.  Depressed mood.   EKG: independently reviewed, showing atrial flutter with rate of 138, QTc 390  Chest x-ray on Admission: I personally reviewed and I agree with radiology read.  I also believe there is a possibility of right lower lobe pneumonia.  DG Chest Port 1 View  Result Date: 06/26/2021 CLINICAL DATA:  Shortness of breath EXAM: PORTABLE CHEST 1 VIEW COMPARISON:  06/11/2021 FINDINGS: There is a right chest wall power-injectable Port-A-Cath with tip in the proximal right atrium via a right internal jugular vein approach. Opacities in the right mid lung are unchanged. Normal cardiomediastinal contours. No focal consolidation. No pleural effusion or pneumothorax. Mild left basilar opacity. IMPRESSION: Mild left basilar opacity favored to be atelectasis. Electronically Signed   By: Ulyses Jarred M.D.   On: 06/26/2021 23:34    Labs on Admission: I have personally reviewed following labs  CBC: Recent Labs  Lab 06/26/21 1326  WBC 16.8*  HGB 12.2  HCT 35.7*  MCV 79.3*  PLT 656   Basic Metabolic Panel: Recent Labs  Lab 06/26/21 1326  NA 123*  K 3.7  CL 84*  CO2 24  GLUCOSE 147*  BUN 80*  CREATININE 2.33*  CALCIUM 8.4*   GFR: Estimated Creatinine Clearance: 21 mL/min (A) (by C-G formula based on SCr of 2.33 mg/dL (H)).  Liver Function Tests: Recent Labs  Lab 06/26/21 1326  AST 27  ALT 15  ALKPHOS 209*  BILITOT 1.9*   PROT 6.2*  ALBUMIN 1.9*   Recent Labs  Lab 06/26/21 1342  AMMONIA 54*   Urine analysis:    Component Value Date/Time   COLORURINE YELLOW (A) 06/26/2021 1326   APPEARANCEUR HAZY (A) 06/26/2021 1326   LABSPEC 1.009 06/26/2021 1326   PHURINE 6.0 06/26/2021 1326   GLUCOSEU NEGATIVE 06/26/2021 1326   [place] 06/26/2021 [place] 06/26/2021 1326   [place] 06/26/2021 1326   PROTEINUR NEGATIVE 06/26/2021 1326   NITRITE NEGATIVE 06/26/2021 1326   LEUKOCYTESUR LARGE (A) 06/26/2021 1326  Dr. Tobie Poet [facility]  If 7PM-7AM, please contact overnight-coverage provider If 7AM-7PM, please contact day coverage provider [email or web address]  06/26/2021, 11:47 PM

## 2021-06-26 NOTE — Progress Notes (Signed)
Pt arrived to floor alert and oriented x 2 (self and situation). Normal Saline running at 161m/hr and LR running at 100/hr. After checking orders, LR changed to bolus as per orders.Labs drawn.  Pt oriented to room and call light, bed in lowest position. All current needs met at this time.

## 2021-06-26 NOTE — ED Provider Notes (Signed)
[facility] Emergency Department Provider Note ____________________________________________   Event Date/Time   First MD Initiated Contact with Patient 06/26/21 1327     (approximate)  I have reviewed the triage vital signs and the nursing notes.  HISTORY  Chief Complaint Altered Mental Status   HPI Crystal [name] is a 70 y.o. femalewho presents to the ED for evaluation of altered mentation.  Chart review indicates history of Karlene Lineman cirrhosis, associated esophageal varices s/p TIPS procedure, diastolic dysfunction, DVTs and upper GI bleed, CAD and Raynaud's syndrome.  Looks like she was previously on apixaban but this was discontinued due to her history of GI bleeds.  IVC filter in place.  Recent closed fracture to the right femur managed nonoperatively.  Overnight observation admission 2 weeks ago due to acute splenic infarct and was discharged to home with home health as she refused SNF.  Patient returns to the ED today with poorly controlled pain to her sacrum and right distal calf.  She is alert and oriented when I talk with her, and family does not join her in the ED, and she denies any confusion or altered mentation.  She reports frustration with her medical condition and exasperation, such that she wished to pursue DNR status and possibly just comfort care.  She is agreeable to medical admission and administration of antibiotics or medicines, but no procedural interventions.  I discussed with her husband over the phone who initially reports concern for her ammonia being elevated but denies that she has been confused.  He does report that she has been in a lot of pain and reports concerned that the wound to her sacrum looks bad.  Past Medical History:  Diagnosis Date   Acute GI bleeding 02/09/2018   Acute upper gastrointestinal bleeding 08/03/2018   Anemia    TAKES IRON TAB   Arthritis    SHOULDER   B12 deficiency 02/18/2018   Bleeding    GI  3/19    Bronchitis    HX OF   CHF (congestive heart failure) (HCC)    Cirrhosis (HCC)    Colon cancer screening    Diabetes mellitus without complication (HCC)    TYPE 2   Fatty (change of) liver, not elsewhere classified    Full dentures    UPPER AND LOWER   Iron deficiency anemia due to chronic blood loss 02/18/2018   Lung nodule, multiple    Pernicious anemia 02/22/2018   PUD (peptic ulcer disease)    Raynaud's syndrome    Upper GI bleeding 08/03/2018    Patient Active Problem List   Diagnosis Date Noted   CKD (chronic kidney disease) stage 4, GFR 15-29 ml/min ([place]) 06/10/2021   Splenic infarct 06/09/2021   Pressure injury of skin 05/30/2021   Closed fracture of distal end of right femur ([place]) 05/23/2021   Fracture of femur, distal, right, closed ([place]) 05/23/2021   Accidental fall 05/23/2021   Angiectasia 05/11/2021   GI bleeding 04/27/2021   Diabetes mellitus, type 2 ([place]) 04/27/2021   CAD (coronary artery disease) 04/27/2021   Chronic kidney disease, stage IV (severe) ([place]) 04/27/2021   DVT (deep venous thrombosis) ([place]) 04/27/2021   Acute blood loss anemia 04/27/2021   Acute deep vein thrombosis (DVT) of distal vein of lower extremity ([place]) 04/08/2021   Liver cirrhosis secondary to NASH ([place]) 04/08/2021   Thrombocytopenia ([place]) 04/08/2021   Chronic diastolic CHF (congestive heart failure) ([place]) 04/08/2021   Fluid retention 02/21/2021   Edema 12/09/2020  Acute on chronic diastolic heart failure ([place]) 12/09/2020   Pulmonary nodules 05/02/2020   Hepatic encephalopathy ([place]) 04/23/2020   Left ovarian cyst 10/06/2019   Hypokalemia 08/21/2019   AMS (altered mental status) 08/20/2019   Acute osteomyelitis of phalanx of hand ([place]) 06/12/2019   Pain in finger of right hand 06/04/2019   Type 2 diabetes mellitus without complication, without long-term current use of insulin ([place]) 04/02/2019   Aortic atherosclerosis ([place]) 04/02/2019   Coronary artery disease involving native coronary  artery of native heart without angina pectoris 04/02/2019   Acute on chronic heart failure with preserved ejection fraction (HFpEF) ([place]) 04/02/2019   Lung nodules    Volume overload 12/11/2018   GAVE (gastric antral vascular ectasia)    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   Type 2 diabetes mellitus with stage 3 chronic kidney disease ([place]) 08/03/2018   Hx of esophageal varices    Other cirrhosis of liver ([place])    Angiodysplasia of small intestine ([place])    Adnexal cyst 03/16/2018   Goals of care, counseling/discussion 03/16/2018   Right middle lobe pulmonary nodule 03/14/2018   Right lower lobe pulmonary nodule 02/18/2018   Iron deficiency anemia due to chronic blood loss 02/18/2018   B12 deficiency 02/18/2018   Anticentromere antibodies present 02/18/2018   Hepatitis C virus infection without hepatic coma 02/18/2018    Past Surgical History:  Procedure Laterality Date   CATARACT EXTRACTION W/PHACO Right 02/06/2018   Procedure: CATARACT EXTRACTION PHACO AND INTRAOCULAR LENS PLACEMENT ([place]) RIGHT DIABETIC;  Surgeon: Leandrew Koyanagi, MD;  Location: [place];  Service: Ophthalmology;  Laterality: Right;  DIABETIC, ORAL MED   CATARACT EXTRACTION W/PHACO Left 04/30/2018   Procedure: CATARACT EXTRACTION PHACO AND INTRAOCULAR LENS PLACEMENT (IOC);  Surgeon: Leandrew Koyanagi, MD;  Location: [facility] ORS;  Service: Ophthalmology;  Laterality: Left;  [place] 01:04 AP% 21.3 CDE 13.66 Fluid pack lot # [id number]   COLONOSCOPY WITH PROPOFOL N/A 03/25/2018   Procedure: COLONOSCOPY WITH PROPOFOL;  Surgeon: Lin Landsman, MD;  Location: [facility] ENDOSCOPY;  Service: Gastroenterology;  Laterality: N/A;   DENTAL SURGERY     EXTRACTIONS   ELECTROMAGNETIC NAVIGATION BROCHOSCOPY Right 01/27/2019   Procedure: ELECTROMAGNETIC NAVIGATION BRONCHOSCOPY;  Surgeon: [name], MD;  Location: [facility];  Service: Cardiopulmonary;  Laterality: Right;   ENTEROSCOPY N/A  07/28/2020   Procedure: Push ENTEROSCOPY;  Surgeon: Lin Landsman, MD;  Location: [facility] ENDOSCOPY;  Service: Gastroenterology;  Laterality: N/A;   ESOPHAGOGASTRODUODENOSCOPY N/A 08/03/2018   Procedure: ESOPHAGOGASTRODUODENOSCOPY (EGD);  Surgeon: Lin Landsman, MD;  Location: [facility] ENDOSCOPY;  Service: Gastroenterology;  Laterality: N/A;   ESOPHAGOGASTRODUODENOSCOPY (EGD) WITH PROPOFOL N/A 02/10/2018   Procedure: ESOPHAGOGASTRODUODENOSCOPY (EGD) WITH PROPOFOL;  Surgeon: Jonathon Bellows, MD;  Location: [facility] ENDOSCOPY;  Service: Gastroenterology;  Laterality: N/A;   ESOPHAGOGASTRODUODENOSCOPY (EGD) WITH PROPOFOL N/A 03/25/2018   Procedure: ESOPHAGOGASTRODUODENOSCOPY (EGD) WITH PROPOFOL;  Surgeon: Lin Landsman, MD;  Location: [facility] ENDOSCOPY;  Service: Gastroenterology;  Laterality: N/A;   ESOPHAGOGASTRODUODENOSCOPY (EGD) WITH PROPOFOL N/A 04/22/2018   Procedure: ESOPHAGOGASTRODUODENOSCOPY (EGD) WITH PROPOFOL with band ligation;  Surgeon: Lin Landsman, MD;  Location: [place];  Service: Gastroenterology;  Laterality: N/A;   ESOPHAGOGASTRODUODENOSCOPY (EGD) WITH PROPOFOL N/A 06/24/2018   Procedure: ESOPHAGOGASTRODUODENOSCOPY (EGD) WITH PROPOFOL;  Surgeon: Lin Landsman, MD;  Location: [facility] ENDOSCOPY;  Service: Gastroenterology;  Laterality: N/A;   ESOPHAGOGASTRODUODENOSCOPY (EGD) WITH PROPOFOL N/A 11/19/2018   Procedure: ESOPHAGOGASTRODUODENOSCOPY (EGD) WITH PROPOFOL;  Surgeon: Lin Landsman, MD;  Location: [place];  Service: Gastroenterology;  Laterality: N/A;   ESOPHAGOGASTRODUODENOSCOPY (EGD) WITH PROPOFOL N/A 03/08/2020   Procedure: ESOPHAGOGASTRODUODENOSCOPY (EGD) WITH PROPOFOL;  Surgeon: Lin Landsman, MD;  Location: [facility] ENDOSCOPY;  Service: Gastroenterology;  Laterality: N/A;   ESOPHAGOGASTRODUODENOSCOPY (EGD) WITH PROPOFOL N/A 04/28/2021   Procedure: ESOPHAGOGASTRODUODENOSCOPY (EGD) WITH PROPOFOL;  Surgeon: Lin Landsman, MD;  Location: [facility]  ENDOSCOPY;  Service: Gastroenterology;  Laterality: N/A;   EYE SURGERY     GIVENS CAPSULE STUDY N/A 03/29/2020   Procedure: GIVENS CAPSULE STUDY;  Surgeon: Lin Landsman, MD;  Location: [facility] ENDOSCOPY;  Service: Gastroenterology;  Laterality: N/A;   HEMORRHOID BANDING  03/25/2018   Procedure: HEMORRHOID BANDING;  Surgeon: Lin Landsman, MD;  Location: [facility] ENDOSCOPY;  Service: Gastroenterology;;   IR EMBO ART  VEN HEMORR LYMPH EXTRAV  INC GUIDE ROADMAPPING  08/07/2018   IR RADIOLOGIST EVAL & MGMT  09/03/2018   IR RADIOLOGIST EVAL & MGMT  12/17/2018   IR RADIOLOGIST EVAL & MGMT  06/24/2019   IR RADIOLOGIST EVAL & MGMT  01/22/2020   IR RADIOLOGIST EVAL & MGMT  02/09/2021   IR TIPS  08/07/2018   IR TRANSHEPATIC PORTOGRAM W HEMO  03/07/2021   IR US GUIDE VASC ACCESS RIGHT  03/07/2021   IR VENO/JUGULAR LEFT  03/07/2021   IVC FILTER INSERTION N/A 04/28/2021   Procedure: IVC FILTER INSERTION;  Surgeon: Algernon Huxley, MD;  Location: [place] CV LAB;  Service: Cardiovascular;  Laterality: N/A;   PORTA CATH INSERTION N/A 07/30/2020   Procedure: PORTA CATH INSERTION;  Surgeon: Katha Cabal, MD;  Location: [place] CV LAB;  Service: Cardiovascular;  Laterality: N/A;   RADIOLOGY WITH ANESTHESIA N/A 08/07/2018   Procedure: TIPS;  Surgeon: Corrie Mckusick, DO;  Location: [place];  Service: Anesthesiology;  Laterality: N/A;   RIGHT HEART CATH N/A 05/20/2021   Procedure: RIGHT HEART CATH;  Surgeon: Nelva Bush, MD;  Location: [place] CV LAB;  Service: Cardiovascular;  Laterality: N/A;   TONSILLECTOMY      Prior to Admission medications   Medication Sig Start Date End Date Taking? Authorizing Provider  acetaminophen (TYLENOL) 500 MG tablet Take 1 tablet (500 mg total) by mouth every 6 (six) hours as needed for mild pain, moderate pain, fever or headache. 06/02/21   Val Riles, MD  aspirin EC 81 MG EC tablet Take 1 tablet (81 mg total) by mouth daily. Swallow whole. 06/12/21   Orson Eva, MD  furosemide (LASIX) 80 MG tablet Take 0.5 tablets (40 mg total) by mouth daily. 06/02/21   Val Riles, MD  gabapentin (NEURONTIN) 100 MG capsule Take 100 mg by mouth daily. For 3 days    [provider]  hydrOXYzine (ATARAX/VISTARIL) 25 MG tablet Take 1 tablet (25 mg total) by mouth 2 (two) times daily as needed. 04/08/21   Kathie Dike, MD  lactulose (CHRONULAC) 10 GM/15ML solution TAKE 15 MLS (10 G TOTAL) BY MOUTH 3 (THREE) TIMES DAILY. 06/14/21   Lin Landsman, MD  metolazone (ZAROXOLYN) 5 MG tablet Take 1 tablet (5 mg total) by mouth every other day. Patient not taking: Reported on 06/23/2021 05/20/21 09/17/21  End, Harrell Gave, MD  ondansetron (ZOFRAN) 4 MG tablet Take 4 mg by mouth every 8 (eight) hours as needed for nausea or vomiting.    [provider]  pantoprazole (PROTONIX) 40 MG tablet Take 1 tablet (40 mg total) by mouth 2 (two) times daily. 05/01/21 06/09/21  Sidney Ace, MD  potassium chloride SA (KLOR-CON) 20 MEQ tablet Take  1 tablet (20 mEq total) by mouth daily. 06/02/21 09/30/21  Val Riles, MD  spironolactone (ALDACTONE) 100 MG tablet TAKE 1 TABLET BY MOUTH TWICE A DAY 06/14/21   Lin Landsman, MD  spironolactone (ALDACTONE) 50 MG tablet Take 1 tablet (50 mg total) by mouth daily. Patient not taking: Reported on 06/23/2021 06/02/21   Val Riles, MD  Vitamin D, Ergocalciferol, (DRISDOL) 1.25 MG (50000 UNIT) CAPS capsule Take 50,000 Units by mouth every Sunday. 01/14/21   [provider]    Allergies Patient has no known allergies.  Family History  Problem Relation Age of Onset   CAD Father    Heart attack Father 71   Cancer Maternal Uncle    Seizures Mother     Social History Social History   Tobacco Use   Smoking status: Never   Smokeless tobacco: Never  Vaping Use   Vaping Use: Never used  Substance Use Topics   Alcohol use: Not Currently    Comment: No EtOH for 30 years.  Never a heavy drinker.   Drug  use: Never    Review of Systems  Constitutional: No fever/chills.  Positive generalized weakness. Eyes: No visual changes. ENT: No sore throat. Cardiovascular: Denies chest pain. Respiratory: Denies shortness of breath. Gastrointestinal: No abdominal pain.   no vomiting.  No diarrhea.  No constipation. Genitourinary: Negative for dysuria. Musculoskeletal: Positive for atraumatic back pain and lower leg pain at her sites of wounds Skin: Negative for rash. Neurological: Negative for headaches, focal weakness or numbness. ____________________________________________   PHYSICAL EXAM:  VITAL SIGNS: Vitals:   06/26/21 1500 06/26/21 1530  BP: (!) 102/59 110/60  Pulse: 72 73  Resp: 18 13  Temp:    SpO2: 100% 99%      Constitutional: Alert and oriented.  Obese.  Chronically ill-appearing and bedbound.  Requires assistance to roll her to look at her back. Eyes: Conjunctivae are normal. PERRL. EOMI. Head: Atraumatic. Nose: No congestion/rhinnorhea. Mouth/Throat: Mucous membranes are dry.  Oropharynx non-erythematous. Neck: No stridor. No cervical spine tenderness to palpation. Cardiovascular: Normal rate, regular rhythm. Grossly normal heart sounds.  Good peripheral circulation. Respiratory: Normal respiratory effort.  No retractions. Lungs CTAB. Gastrointestinal: Soft , nondistended. No CVA tenderness. Mild suprapubic tenderness without peritoneal features to the abdomen. Musculoskeletal:  Sacral wound at midline and to the right of midline with surrounding induration and erythema.  No purulence noted. Additional pressure ulcer and wound to her distal right calf with surrounding induration and tenderness to palpation.  No contralateral wound to the left side. Neurologic:  Normal speech and language. No gross focal neurologic deficits are appreciated.  Skin:  Skin is warm, dry  Psychiatric: Tearful, expressing frustration and helplessness.  No  suicidality.  ____________________________________________   LABS (all labs ordered are listed, but only abnormal results are displayed)  Labs Reviewed  COMPREHENSIVE METABOLIC PANEL - Abnormal; Notable for the following components:      Result Value   Sodium 123 (*)    Chloride 84 (*)    Glucose, Bld 147 (*)    BUN 80 (*)    Creatinine, Ser 2.33 (*)    Calcium 8.4 (*)    Total Protein 6.2 (*)    Albumin 1.9 (*)    Alkaline Phosphatase 209 (*)    Total Bilirubin 1.9 (*)    GFR, Estimated 22 (*)    All other components within normal limits  CBC - Abnormal; Notable for the following components:   WBC 16.8 (*)  HCT 35.7 (*)    MCV 79.3 (*)    RDW 19.1 (*)    All other components within normal limits  AMMONIA - Abnormal; Notable for the following components:   Ammonia 54 (*)    All other components within normal limits  URINALYSIS, COMPLETE (UACMP) WITH MICROSCOPIC - Abnormal; Notable for the following components:   Color, Urine YELLOW (*)    APPearance HAZY (*)    Leukocytes,Ua LARGE (*)    WBC, UA >50 (*)    Bacteria, UA RARE (*)    All other components within normal limits  LACTIC ACID, PLASMA - Abnormal; Notable for the following components:   Lactic Acid, Venous 2.6 (*)    All other components within normal limits  RESP PANEL BY RT-PCR (FLU A&B, COVID) ARPGX2  URINE CULTURE  LACTIC ACID, PLASMA  PROCALCITONIN  CBG MONITORING, ED   ____________________________________________  12 Lead EKG  Regular rhythm, possibly atrial flutter, with a rate of 80 bpm.  Normal axis.  QRS is narrow, but borderline widened at 118 ms.  No evidence of STEMI. ____________________________________________  RADIOLOGY  ED MD interpretation:    Official radiology report(s): No results found.  ____________________________________________   PROCEDURES and INTERVENTIONS  Procedure(s) performed (including Critical Care):  .1-3 Lead EKG Interpretation  Date/Time: 06/26/2021  3:36 PM Performed by: Vladimir Crofts, MD Authorized by: Vladimir Crofts, MD     Interpretation: normal     ECG rate:  76   ECG rate assessment: normal     Rhythm: sinus rhythm     Ectopy: none     Conduction: normal    Medications  vancomycin (VANCOREADY) IVPB 1500 mg/300 mL (has no administration in time range)  cefTRIAXone (ROCEPHIN) 1 g in sodium chloride 0.9 % 100 mL IVPB (0 g Intravenous Stopped 06/26/21 1537)  lactated ringers bolus 1,000 mL (1,000 mLs Intravenous New Bag/Given 06/26/21 1504)  morphine 4 MG/ML injection 6 mg (6 mg Intravenous Given 06/26/21 1501)    ____________________________________________   MDM / ED COURSE   70 year old woman presents from home with worsening pain at the sites of pressure ulcers, with evidence of superimposed infection and cellulitis, as well as acute cystitis and AKI requiring medical admission.  Does not quite meet sepsis criteria as she only has a leukocytosis, but no vital signs abnormalities.  Mild lactic acidosis of 2.6.  Urine is certainly infectious with signs of acute cystitis.  Metabolic panel with AKI and hyponatremia.  Her examination has some significant induration and erythema around her wounds concerning for superimposed cellulitis. I discussed with her CT imaging of the sites to assess for underlying abscess or fluid collection, but she expresses helplessness to her situation and indicates that she would not want any sort of surgical or other intervention as she is "about done with it."  She reports desire to be transition to DNR status and talk with palliative care about comfort measures.  She is agreeable to medical management and IV antibiotics. We will therefore discuss the case with medicine for admission  Clinical Course as of 06/26/21 1538  Sun Jun 26, 2021  1436 Called husband, [name], he's concerned about ammonia level because she's been more confused. Tired of hurting. Wounds to sacrum and leg looking bad [DS]     Clinical Course User Index [DS] Vladimir Crofts, MD    ____________________________________________   FINAL CLINICAL IMPRESSION(S) / ED DIAGNOSES  Final diagnoses:  AKI (acute kidney injury) ([place])  Infected decubitus ulcer, unspecified ulcer stage  Cellulitis of right lower extremity     ED Discharge Orders     None        Juris Gosnell Tamala Julian   Note:  This document was prepared using Dragon voice recognition software and may include unintentional dictation errors.    Vladimir Crofts, MD 06/26/21 1538

## 2021-06-26 NOTE — ED Notes (Signed)
Pt cleaned. Dry brief and purewick in place. New dressing placed on sacral wound

## 2021-06-26 NOTE — ED Notes (Signed)
Pt transitioned to hospital bed

## 2021-06-26 NOTE — ED Triage Notes (Signed)
Pt to ED via [facility] from home. Per EMS family called due to pt being AMS. Family stating pt's baseline is A&Ox4. Pt alert but disoriented at this time. Family stating this normally happens when pt's ammonia level is elevated or has a UTI. Pt has missed 2 iron infusions due to having a closed femur fracture where she was in rehab. Pt has raynaud's disease and unable to obtain accurate oxygen sats with EMS, all other VSS. Pt also has PI to sacrum and foot.

## 2021-06-26 NOTE — Progress Notes (Signed)
Pharmacy Antibiotic Note  Crystal [name] is a 69 y.o. female admitted on 06/26/2021 with wound infection.  Pharmacy has been consulted for Vancomycin dosing.  Plan: Pt ordered initial dose of 1500 mg in ED. Vancomycin 1000 mg IV Q 48 hrs.  Goal AUC 400-550. Expected AUC: 506.7 SCr used: 2.33 (up from 1.5 on 7/30)  Due to unstable renal function and 48 hr dosing interval will recheck am labs prior to placing variable or scheduled Vancomycin orders.  Pharmacy will continue to follow.  Height: 5' 2" (157.5 cm) Weight: 70.8 kg (156 lb) IBW/kg (Calculated) : 50.1  Temp (24hrs), Avg:98.1 F (36.7 C), Min:97.7 F (36.5 C), Max:98.4 F (36.9 C)  Recent Labs  Lab 06/26/21 1326 06/26/21 1845  WBC 16.8*  --   CREATININE 2.33*  --   LATICACIDVEN 2.6* 3.2*    Estimated Creatinine Clearance: 21 mL/min (A) (by C-G formula based on SCr of 2.33 mg/dL (H)).    No Known Allergies  Antimicrobials this admission: 8/14 Vancomycin >>  8/15 Azithromycin >>  8/15 Ceftriaxone >>  Microbiology results: 08/14 UCx: Pending   Thank you for allowing pharmacy to be a part of this patient's care.  Renda Rolls, PharmD, MBA 06/27/2021 2:00 AM

## 2021-06-27 DIAGNOSIS — Z95828 Presence of other vascular implants and grafts: Secondary | ICD-10-CM | POA: Diagnosis not present

## 2021-06-27 DIAGNOSIS — K7581 Nonalcoholic steatohepatitis (NASH): Secondary | ICD-10-CM | POA: Diagnosis present

## 2021-06-27 DIAGNOSIS — B1920 Unspecified viral hepatitis C without hepatic coma: Secondary | ICD-10-CM | POA: Diagnosis present

## 2021-06-27 DIAGNOSIS — R4182 Altered mental status, unspecified: Secondary | ICD-10-CM | POA: Diagnosis present

## 2021-06-27 DIAGNOSIS — N390 Urinary tract infection, site not specified: Secondary | ICD-10-CM

## 2021-06-27 DIAGNOSIS — J189 Pneumonia, unspecified organism: Secondary | ICD-10-CM | POA: Diagnosis not present

## 2021-06-27 DIAGNOSIS — I5032 Chronic diastolic (congestive) heart failure: Secondary | ICD-10-CM | POA: Diagnosis present

## 2021-06-27 DIAGNOSIS — Z8249 Family history of ischemic heart disease and other diseases of the circulatory system: Secondary | ICD-10-CM | POA: Diagnosis not present

## 2021-06-27 DIAGNOSIS — K7469 Other cirrhosis of liver: Secondary | ICD-10-CM | POA: Diagnosis not present

## 2021-06-27 DIAGNOSIS — G9340 Encephalopathy, unspecified: Secondary | ICD-10-CM | POA: Diagnosis present

## 2021-06-27 DIAGNOSIS — Z20822 Contact with and (suspected) exposure to covid-19: Secondary | ICD-10-CM | POA: Diagnosis present

## 2021-06-27 DIAGNOSIS — Z7189 Other specified counseling: Secondary | ICD-10-CM | POA: Diagnosis not present

## 2021-06-27 DIAGNOSIS — K7200 Acute and subacute hepatic failure without coma: Secondary | ICD-10-CM

## 2021-06-27 DIAGNOSIS — N179 Acute kidney failure, unspecified: Secondary | ICD-10-CM | POA: Diagnosis present

## 2021-06-27 DIAGNOSIS — E871 Hypo-osmolality and hyponatremia: Secondary | ICD-10-CM | POA: Diagnosis present

## 2021-06-27 DIAGNOSIS — Z66 Do not resuscitate: Secondary | ICD-10-CM | POA: Diagnosis present

## 2021-06-27 DIAGNOSIS — L03115 Cellulitis of right lower limb: Secondary | ICD-10-CM | POA: Diagnosis present

## 2021-06-27 DIAGNOSIS — I8510 Secondary esophageal varices without bleeding: Secondary | ICD-10-CM | POA: Diagnosis present

## 2021-06-27 DIAGNOSIS — Z515 Encounter for palliative care: Secondary | ICD-10-CM | POA: Diagnosis not present

## 2021-06-27 DIAGNOSIS — K7290 Hepatic failure, unspecified without coma: Secondary | ICD-10-CM | POA: Diagnosis present

## 2021-06-27 DIAGNOSIS — L89319 Pressure ulcer of right buttock, unspecified stage: Secondary | ICD-10-CM | POA: Diagnosis present

## 2021-06-27 DIAGNOSIS — E1122 Type 2 diabetes mellitus with diabetic chronic kidney disease: Secondary | ICD-10-CM | POA: Diagnosis present

## 2021-06-27 DIAGNOSIS — Z86718 Personal history of other venous thrombosis and embolism: Secondary | ICD-10-CM | POA: Diagnosis not present

## 2021-06-27 DIAGNOSIS — G9341 Metabolic encephalopathy: Secondary | ICD-10-CM | POA: Diagnosis present

## 2021-06-27 DIAGNOSIS — I7300 Raynaud's syndrome without gangrene: Secondary | ICD-10-CM | POA: Diagnosis present

## 2021-06-27 DIAGNOSIS — I2510 Atherosclerotic heart disease of native coronary artery without angina pectoris: Secondary | ICD-10-CM | POA: Diagnosis present

## 2021-06-27 DIAGNOSIS — Z7982 Long term (current) use of aspirin: Secondary | ICD-10-CM | POA: Diagnosis not present

## 2021-06-27 DIAGNOSIS — Z79899 Other long term (current) drug therapy: Secondary | ICD-10-CM | POA: Diagnosis not present

## 2021-06-27 DIAGNOSIS — N184 Chronic kidney disease, stage 4 (severe): Secondary | ICD-10-CM | POA: Diagnosis present

## 2021-06-27 DIAGNOSIS — D500 Iron deficiency anemia secondary to blood loss (chronic): Secondary | ICD-10-CM | POA: Diagnosis present

## 2021-06-27 LAB — BASIC METABOLIC PANEL
Anion gap: 13 (ref 5–15)
BUN: 76 mg/dL — ABNORMAL HIGH (ref 8–23)
CO2: 23 mmol/L (ref 22–32)
Calcium: 8 mg/dL — ABNORMAL LOW (ref 8.9–10.3)
Chloride: 90 mmol/L — ABNORMAL LOW (ref 98–111)
Creatinine, Ser: 2.11 mg/dL — ABNORMAL HIGH (ref 0.44–1.00)
GFR, Estimated: 25 mL/min — ABNORMAL LOW (ref >60)
Glucose, Bld: 152 mg/dL — ABNORMAL HIGH (ref 70–99)
Potassium: 3.9 mmol/L (ref 3.5–5.1)
Sodium: 126 mmol/L — ABNORMAL LOW (ref 135–145)

## 2021-06-27 LAB — CBC
HCT: 33.2 % — ABNORMAL LOW (ref 36.0–46.0)
Hemoglobin: 11.1 g/dL — ABNORMAL LOW (ref 12.0–15.0)
MCH: 26.7 pg (ref 26.0–34.0)
MCHC: 33.4 g/dL (ref 30.0–36.0)
MCV: 79.8 fL — ABNORMAL LOW (ref 80.0–100.0)
Platelets: 192 10*3/uL (ref 150–400)
RBC: 4.16 MIL/uL (ref 3.87–5.11)
RDW: 19 % — ABNORMAL HIGH (ref 11.5–15.5)
WBC: 19.5 10*3/uL — ABNORMAL HIGH (ref 4.0–10.5)
nRBC: 0 % (ref 0.0–0.2)

## 2021-06-27 LAB — PROTIME-INR
INR: 1.5 — ABNORMAL HIGH (ref 0.8–1.2)
Prothrombin Time: 18.2 seconds — ABNORMAL HIGH (ref 11.4–15.2)

## 2021-06-27 LAB — PROCALCITONIN: Procalcitonin: 1.13 ng/mL

## 2021-06-27 LAB — CORTISOL-AM, BLOOD: Cortisol - AM: 22.2 ug/dL (ref 6.7–22.6)

## 2021-06-27 LAB — LACTIC ACID, PLASMA
Lactic Acid, Venous: 4.7 mmol/L (ref 0.5–1.9)
Lactic Acid, Venous: 2.7 mmol/L (ref 0.5–1.9)

## 2021-06-27 MED ORDER — VANCOMYCIN VARIABLE DOSE PER UNSTABLE RENAL FUNCTION (PHARMACIST DOSING): Status: DC

## 2021-06-27 MED ORDER — MORPHINE SULFATE (PF) 2 MG/ML IV SOLN
2.0000 mg | INTRAVENOUS | Status: DC | PRN
  Administered 2021-06-27 – 2021-06-29 (×4): 2 mg via INTRAVENOUS
  Filled 2021-06-27 (×4): qty 1

## 2021-06-27 MED ORDER — CHLORHEXIDINE GLUCONATE CLOTH 2 % EX PADS
6.0000 | MEDICATED_PAD | Freq: Every day | CUTANEOUS | Status: DC
  Administered 2021-06-28 – 2021-06-29 (×2): 6 via TOPICAL

## 2021-06-27 MED ORDER — COLLAGENASE 250 UNIT/GM EX OINT
TOPICAL_OINTMENT | Freq: Every day | CUTANEOUS | Status: DC
  Administered 2021-06-29: 1 via TOPICAL
  Filled 2021-06-27: qty 30

## 2021-06-27 MED ORDER — VANCOMYCIN HCL 500 MG/100ML IV SOLN
500.0000 mg | INTRAVENOUS | Status: DC
  Administered 2021-06-28: 500 mg via INTRAVENOUS
  Filled 2021-06-27: qty 100

## 2021-06-27 MED ORDER — VANCOMYCIN HCL 1500 MG/300ML IV SOLN
1500.0000 mg | Freq: Once | INTRAVENOUS | Status: AC
  Administered 2021-06-27: 1500 mg via INTRAVENOUS
  Filled 2021-06-27: qty 300

## 2021-06-27 NOTE — Progress Notes (Addendum)
Pt placed on Sizewise air mattress bed per wound care orders.

## 2021-06-27 NOTE — Progress Notes (Signed)
Pharmacy Antibiotic Note  Crystal [name] is a 70 y.o. female admitted on 06/26/2021 with wound infection.  Pharmacy has been consulted for Vancomycin dosing.  Plan: Vancomycin 1500 mg IV loading dose, followed by 500 mg IV q24h Goal AUC 400-550  Est AUC: 468.9 Est Cmax: 24.8 Est Cmin: 15.2 Calculated with SCr 2.11, Vd 0.72  Pt is also ordered ceftriaxone 1 g IV q24h and azithromycin 500 mg IV q24h  Monitor clinical picture, renal function, and vancomycin levels at steady state  F/U C&S, abx deescalation / LOT   Height: 5' 2" (157.5 cm) Weight: 70.8 kg (156 lb) IBW/kg (Calculated) : 50.1  Temp (24hrs), Avg:98 F (36.7 C), Min:97.7 F (36.5 C), Max:98.4 F (36.9 C)  Recent Labs  Lab 06/26/21 1326 06/26/21 1845 06/27/21 0450  WBC 16.8*  --  19.5*  CREATININE 2.33*  --  2.11*  LATICACIDVEN 2.6* 3.2*  --      Estimated Creatinine Clearance: 23.2 mL/min (A) (by C-G formula based on SCr of 2.11 mg/dL (H)).    No Known Allergies  Antimicrobials this admission: 8/15 Vancomycin >>  8/14 Azithromycin >>  8/15 Ceftriaxone >>  Microbiology results: 08/14 UCx: Pending   Thank you for allowing pharmacy to be a part of this patient's care.  Darnelle Bos, PharmD 06/27/2021 12:39 PM

## 2021-06-27 NOTE — Progress Notes (Addendum)
PROGRESS NOTE    Crystal [name]  ION:[id number] DOB: 07-12-1951 DOA: 06/26/2021 PCP: Darrol Jump, NP    Assessment & Plan:   Active Problems:   Iron deficiency anemia due to chronic blood loss   Anticentromere antibodies present   Hepatitis C virus infection without hepatic coma   Other cirrhosis of liver (HCC)   Type 2 diabetes mellitus with stage 3 chronic kidney disease (HCC)   S/P TIPS (transjugular intrahepatic portosystemic shunt)   Coronary artery disease involving native coronary artery of native heart without angina pectoris   Liver cirrhosis secondary to NASH [facility])   GI bleeding   Diabetes mellitus, type 2 (HCC)   Fracture of femur, distal, right, closed ([place])   Pressure injury of skin   Splenic infarct   CKD (chronic kidney disease) stage 4, GFR 15-29 ml/min (HCC)   Sacral wound   AKI (acute kidney injury) ([place])   Acute hepatic & metabolic encephalopathy: in setting of multiple etiologies including elevated ammonia, UTI, CAP, and sacral decubitus ulcer.  Sepsis r/o. Continue on IV rocephin, azithromycin   UTI: urine cx is pending. Continue on IV rocpehin   AKI on CKDIIIb: baseline Cr 1.5-1.Marland Kitchen65. Cr is trending down from day prior. Continue on IVFs x 1 day (stop at 1615 today)   CAP: mild left basilar opacity as per CXR. Continue on IV rocephin, azithromycin   Sacral decubitus: of right buttocks. Unstageable. Continue w/ wound care.  Leukocytosis: secondary to infections. Continue on IV abxs  Elevated lactic acid: continue on IVFs. Possibly secondary to cirrhosis. Repeat lactic acid ordered  RLE wound: continue w/ wound care    Hx of Raynauds disease: continue w/ supportive care  Cirrhosis: secondary to nash. Continue on lactulose  ACD: likely secondary to cirrhosis. No need for a transfusion currently   Hx of esophageal varices: s/p TIPS  Hyponatremia: likely secondary to cirrhosis.   Chronic diastolic CHF: continue on home dose of spironolactone    Hx of acute splenic infarct: no anticoagulation but ok for aspirin 81 mg   Sacral pressure wounds: present on admission. Wound care consulted  Right lower extremity wound : present on admission. 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 Please see Dr. Alyse Low note for more information.    DVT prophylaxis: SCDs on LLE. Has IVC filter  Code Status: DNR  Family Communication:  Disposition Plan: unclear   Level of care: Progressive Cardiac  Status is: Inpatient  Remains inpatient appropriate because:Unsafe d/c plan, IV treatments appropriate due to intensity of illness or inability to take PO, and Inpatient level of care appropriate due to severity of illness  Dispo: The patient is from: Home              Anticipated d/c is to: unclear              Patient currently is not medically stable to d/c.   Difficult to place patient : unclear      Consultants:    Procedures:   Antimicrobials:rocephin, azithromycin   Subjective: Pt c/o malaise  Objective: Vitals:   06/26/21 2335 06/26/21 2350 06/27/21 0647 06/27/21 0738  BP: 120/64 (!) 93/47 (!) 113/54 111/64  Pulse: 72 (!) 56 65 65  Resp:  18  16  Temp:    97.9 F (36.6 C)  TempSrc:    Axillary  SpO2:  100% 100% 100%  Weight:      Height:        Intake/Output Summary (Last 24 hours) at 06/27/2021 0803 Last data filed at 06/27/2021 0349 Gross per 24 hour  Intake 1899.28 ml  Output --  Net 1899.28 ml   Filed Weights   06/26/21 1324  Weight: 70.8 kg    Examination:  General exam: Appears calm and comfortable  Respiratory system: diminished breath sounds b/l Cardiovascular system: S1 & S2 +. No rubs, gallops or clicks. Gastrointestinal system: Abdomen is nondistended, soft and  nontender.  Hypoactive bowel sounds heard. Central nervous system: Alert and awake Moves all extremities Psychiatry: Judgement and insight appear abnormal. Flat mood and affect    Data Reviewed: I have personally reviewed following labs and imaging studies  CBC: Recent Labs  Lab 06/26/21 1326 06/27/21 0450  WBC 16.8* 19.5*  HGB 12.2 11.1*  HCT 35.7* 33.2*  MCV 79.3* 79.8*  PLT 235 161   Basic Metabolic Panel: Recent Labs  Lab 06/26/21 1326 06/27/21 0450  NA 123* 126*  K 3.7 3.9  CL 84* 90*  CO2 24 23  GLUCOSE 147* 152*  BUN 80* 76*  CREATININE 2.33* 2.11*  CALCIUM 8.4* 8.0*   GFR: Estimated Creatinine Clearance: 23.2 mL/min (A) (by C-G formula based on SCr of 2.11 mg/dL (H)). Liver Function Tests: Recent Labs  Lab 06/26/21 1326  AST 27  ALT 15  ALKPHOS 209*  BILITOT 1.9*  PROT 6.2*  ALBUMIN 1.9*   No results for input(s): LIPASE, AMYLASE in the last 168 hours. Recent Labs  Lab 06/26/21 1342  AMMONIA 54*   Coagulation Profile: Recent Labs  Lab 06/27/21 0450  INR 1.5*   Cardiac Enzymes: No results for input(s): CKTOTAL, CKMB, CKMBINDEX, TROPONINI in the last 168 hours. BNP (last 3 results) No results for input(s): PROBNP in the last 8760 hours. HbA1C: No results for input(s): HGBA1C in the last 72 hours. CBG: No results for input(s): GLUCAP in the last 168 hours. Lipid Profile: No results for input(s): CHOL, HDL, LDLCALC, TRIG, CHOLHDL, LDLDIRECT in the last 72 hours. Thyroid Function Tests: No results for input(s): TSH, T4TOTAL, FREET4, T3FREE, THYROIDAB in the last 72 hours. Anemia Panel: No results for input(s): VITAMINB12, FOLATE, FERRITIN, TIBC, IRON, RETICCTPCT in the last 72 hours. Sepsis Labs: Recent Labs  Lab 06/26/21 1326 06/26/21 1342 06/26/21 1845 06/27/21 0450  PROCALCITON  --  0.94 0.93 1.13  LATICACIDVEN 2.6*  --  3.2*  --     Recent Results (from the past 240 hour(s))  Resp Panel by RT-PCR (Flu A&B, Covid)  Nasopharyngeal Swab     Status: None   Collection Time: 06/26/21  1:26 PM   Specimen: Nasopharyngeal Swab; Nasopharyngeal(NP) swabs in vial transport medium  Result Value Ref Range Status   SARS Coronavirus 2 by RT PCR NEGATIVE NEGATIVE Final    Comment: (NOTE) SARS-CoV-2 target nucleic acids are NOT DETECTED.  The SARS-CoV-2 RNA is generally detectable in upper respiratory specimens during the acute phase of infection. The lowest concentration of SARS-CoV-2 viral copies this assay can detect is 138 copies/mL. A negative result does not preclude SARS-Cov-2 infection and should not be used as the sole basis for treatment or other patient management decisions. A negative result may occur with  improper specimen collection/handling, submission of specimen other than nasopharyngeal swab, presence of viral mutation(s) within the areas targeted by  this assay, and inadequate number of viral copies(<138 copies/mL). A negative result must be combined with clinical observations, patient history, and epidemiological information. The expected result is Negative.  Fact Sheet for Patients:  EntrepreneurPulse.com.au  Fact Sheet for Healthcare Providers:  IncredibleEmployment.be  This test is no t yet approved or cleared by the [place] FDA and  has been authorized for detection and/or diagnosis of SARS-CoV-2 by FDA under an Emergency Use Authorization (EUA). This EUA will remain  in effect (meaning this test can be used) for the duration of the COVID-19 declaration under Section 564(b)(1) of the Act, 21 U.S.C.section 360bbb-3(b)(1), unless the authorization is terminated  or revoked sooner.       Influenza A by PCR NEGATIVE NEGATIVE Final   Influenza B by PCR NEGATIVE NEGATIVE Final    Comment: (NOTE) The Xpert Xpress SARS-CoV-2/FLU/RSV plus assay is intended as an aid in the diagnosis of influenza from Nasopharyngeal swab specimens and should not be  used as a sole basis for treatment. Nasal washings and aspirates are unacceptable for Xpert Xpress SARS-CoV-2/FLU/RSV testing.  Fact Sheet for Patients: EntrepreneurPulse.com.au  Fact Sheet for Healthcare Providers: IncredibleEmployment.be  This test is not yet approved or cleared by the [place] FDA and has been authorized for detection and/or diagnosis of SARS-CoV-2 by FDA under an Emergency Use Authorization (EUA). This EUA will remain in effect (meaning this test can be used) for the duration of the COVID-19 declaration under Section 564(b)(1) of the Act, 21 U.S.C. section 360bbb-3(b)(1), unless the authorization is terminated or revoked.  Performed at [facility], [address] [place], [place] [address]          Radiology Studies: DG Chest [place] 1 View  Result Date: 06/26/2021 CLINICAL DATA:  Shortness of breath EXAM: PORTABLE CHEST 1 VIEW COMPARISON:  06/11/2021 FINDINGS: There is a right chest wall power-injectable Port-A-Cath with tip in the proximal right atrium via a right internal jugular vein approach. Opacities in the right mid lung are unchanged. Normal cardiomediastinal contours. No focal consolidation. No pleural effusion or pneumothorax. Mild left basilar opacity. IMPRESSION: Mild left basilar opacity favored to be atelectasis. Electronically Signed   By: Ulyses Jarred M.D.   On: 06/26/2021 23:34        Scheduled Meds:  aspirin EC  81 mg Oral Daily   Chlorhexidine Gluconate Cloth  6 each Topical Daily   lactulose  10 g Oral TID   pantoprazole  40 mg Oral Daily   potassium chloride SA  20 mEq Oral Daily   spironolactone  100 mg Oral BID   vancomycin variable dose per unstable renal function (pharmacist dosing)   Does not apply See admin instructions   Continuous Infusions:  sodium chloride 125 mL/hr at 06/26/21 1616   azithromycin 500 mg (06/27/21 0108)   cefTRIAXone (ROCEPHIN)  IV     lactated  ringers 125 mL/hr at 06/27/21 0637   vancomycin       LOS: 0 days    Time spent: 35 mins     Wyvonnia Dusky, MD [facility] Pager [phone]  If 7PM-7AM, please contact night-coverage 06/27/2021, 8:03 AM

## 2021-06-27 NOTE — Plan of Care (Signed)
  Problem: Clinical Measurements: Goal: Ability to maintain clinical measurements within normal limits will improve Outcome: Progressing   Problem: Clinical Measurements: Goal: Will remain free from infection Outcome: Progressing   Problem: Clinical Measurements: Goal: Diagnostic test results will improve Outcome: Progressing   Problem: Clinical Measurements: Goal: Respiratory complications will improve Outcome: Progressing

## 2021-06-27 NOTE — Consult Note (Signed)
[place] Nurse Consult Note: Patient receiving care in [facility] 246. Primary RN assisting with positioning for wound assessment. Reason for Consult: sacral and calf wound Wound type: Unstageable PIs to right buttock that measures 13 cm x 7 cm and is grey and yellow in color. RLE-calf- has two unstageable wounds. One measures 8 cm x 5 cm and is yellow and black, the other adjacent one measures 6 cm x 3 cm and is also yellow and black.  The left great toe tip is purple and is a DTPI.  The right inner, upper thigh has a linear DTPI that measures 1 cm x 13 cm and is purple in color and represents a DTPI. Pressure Injury POA: Yes Measurement: Wound bed: Drainage (amount, consistency, odor)  Periwound: intact Dressing procedure/placement/frequency: Daily santyl, saline gauze and foam dressings to the right buttock and RLE. Every shift iodine to the tope tip and use of Prevalon heel lift boots.  I also ordered a standard size bed with air mattress.  Monitor the wound area(s) for worsening of condition such as: Signs/symptoms of infection,  Increase in size,  Development of or worsening of odor, Development of pain, or increased pain at the affected locations.  Notify the medical team if any of these develop.  Thank you for the consult.  Discussed plan of care with the patient and bedside nurse.  [place] nurse will not follow at this time.  Please re-consult the [place] team if needed.  Val Riles, RN, MSN, CWOCN, CNS-BC, pager [phone]

## 2021-06-28 ENCOUNTER — Encounter: Payer: Self-pay | Admitting: Internal Medicine

## 2021-06-28 DIAGNOSIS — Z7189 Other specified counseling: Secondary | ICD-10-CM

## 2021-06-28 DIAGNOSIS — G9340 Encephalopathy, unspecified: Secondary | ICD-10-CM

## 2021-06-28 DIAGNOSIS — K7469 Other cirrhosis of liver: Secondary | ICD-10-CM

## 2021-06-28 DIAGNOSIS — Z515 Encounter for palliative care: Secondary | ICD-10-CM | POA: Diagnosis not present

## 2021-06-28 LAB — COMPREHENSIVE METABOLIC PANEL
ALT: 12 U/L (ref 0–44)
AST: 23 U/L (ref 15–41)
Albumin: 1.6 g/dL — ABNORMAL LOW (ref 3.5–5.0)
Alkaline Phosphatase: 177 U/L — ABNORMAL HIGH (ref 38–126)
Anion gap: 6 (ref 5–15)
BUN: 73 mg/dL — ABNORMAL HIGH (ref 8–23)
CO2: 26 mmol/L (ref 22–32)
Calcium: 7.9 mg/dL — ABNORMAL LOW (ref 8.9–10.3)
Chloride: 94 mmol/L — ABNORMAL LOW (ref 98–111)
Creatinine, Ser: 2.02 mg/dL — ABNORMAL HIGH (ref 0.44–1.00)
GFR, Estimated: 26 mL/min — ABNORMAL LOW (ref >60)
Glucose, Bld: 124 mg/dL — ABNORMAL HIGH (ref 70–99)
Potassium: 3.7 mmol/L (ref 3.5–5.1)
Sodium: 126 mmol/L — ABNORMAL LOW (ref 135–145)
Total Bilirubin: 1.2 mg/dL (ref 0.3–1.2)
Total Protein: 5 g/dL — ABNORMAL LOW (ref 6.5–8.1)

## 2021-06-28 LAB — CBC
HCT: 29.1 % — ABNORMAL LOW (ref 36.0–46.0)
Hemoglobin: 9.7 g/dL — ABNORMAL LOW (ref 12.0–15.0)
MCH: 26.9 pg (ref 26.0–34.0)
MCHC: 33.3 g/dL (ref 30.0–36.0)
MCV: 80.6 fL (ref 80.0–100.0)
Platelets: 159 10*3/uL (ref 150–400)
RBC: 3.61 MIL/uL — ABNORMAL LOW (ref 3.87–5.11)
RDW: 18.9 % — ABNORMAL HIGH (ref 11.5–15.5)
WBC: 11.4 10*3/uL — ABNORMAL HIGH (ref 4.0–10.5)
nRBC: 0 % (ref 0.0–0.2)

## 2021-06-28 LAB — PROCALCITONIN: Procalcitonin: 0.94 ng/mL

## 2021-06-28 LAB — AMMONIA: Ammonia: 58 umol/L — ABNORMAL HIGH (ref 9–35)

## 2021-06-28 LAB — MRSA NEXT GEN BY PCR, NASAL: MRSA by PCR Next Gen: NOT DETECTED

## 2021-06-28 MED ORDER — HALOPERIDOL LACTATE 5 MG/ML IJ SOLN: 0.5000 mg | INTRAMUSCULAR | Status: DC | PRN

## 2021-06-28 MED ORDER — GLYCOPYRROLATE 0.2 MG/ML IJ SOLN: 0.2000 mg | INTRAMUSCULAR | Status: DC | PRN

## 2021-06-28 MED ORDER — LORAZEPAM 2 MG/ML PO CONC: 1.0000 mg | ORAL | Status: DC | PRN

## 2021-06-28 MED ORDER — MORPHINE SULFATE (PF) 2 MG/ML IV SOLN: 2.0000 mg | INTRAVENOUS | Status: DC | PRN

## 2021-06-28 MED ORDER — POLYVINYL ALCOHOL 1.4 % OP SOLN
1.0000 [drp] | Freq: Four times a day (QID) | OPHTHALMIC | Status: DC | PRN
  Filled 2021-06-28: qty 15

## 2021-06-28 MED ORDER — HALOPERIDOL LACTATE 2 MG/ML PO CONC
0.5000 mg | ORAL | Status: DC | PRN
  Filled 2021-06-28: qty 0.3

## 2021-06-28 MED ORDER — LORAZEPAM 1 MG PO TABS: 1.0000 mg | ORAL_TABLET | ORAL | Status: DC | PRN

## 2021-06-28 MED ORDER — ONDANSETRON HCL 4 MG/2ML IJ SOLN: 4.0000 mg | Freq: Four times a day (QID) | INTRAMUSCULAR | Status: DC | PRN

## 2021-06-28 MED ORDER — ONDANSETRON 4 MG PO TBDP
4.0000 mg | ORAL_TABLET | Freq: Four times a day (QID) | ORAL | Status: DC | PRN
  Filled 2021-06-28: qty 1

## 2021-06-28 MED ORDER — LORAZEPAM 2 MG/ML IJ SOLN: 1.0000 mg | INTRAMUSCULAR | Status: DC | PRN

## 2021-06-28 MED ORDER — BIOTENE DRY MOUTH MT LIQD: 15.0000 mL | OROMUCOSAL | Status: DC | PRN

## 2021-06-28 MED ORDER — HALOPERIDOL 0.5 MG PO TABS
0.5000 mg | ORAL_TABLET | ORAL | Status: DC | PRN
  Filled 2021-06-28: qty 1

## 2021-06-28 MED ORDER — GLYCOPYRROLATE 1 MG PO TABS: 1.0000 mg | ORAL_TABLET | ORAL | Status: DC | PRN

## 2021-06-28 NOTE — Progress Notes (Signed)
PROGRESS NOTE    HPI was taken from Dr. Tobie Poet: Crystal [name] is a 70 y.o. 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   At bedside patient was able to tell me her name, her age, she knows she is in the hospital.  She has a flat affect and looks depressed.  Upon discussion of resuscitative measures, and intubation she became more alert and adamantly expressed no to chest compressions and no to intubation should she become unable to breathe.   She denies fever, chest pain, shortness of breath, abdominal pain.  She endorses right lower extremity wound pain.  She states that she does not want to be hospitalized any longer.  She just wants to die.  She wants to talk to palliative to discuss her options.   She states that at this time she is okay with getting IV antibiotics and fluids however she does not want any procedures or invasive interventions.   Social history: Lives with long term boyfriend, Aurther Loft.    Vaccination history: Unknown   Hospital course from Dr. Jimmye Norman 8/15-8/16/22: Pt presented w/ altered mental status which was likely secondary to CAP, hepatic encephalopathy, UTI and sacral decubitus. Pt was initially put on IV abxs, lactulose and IVFs. Palliative care was consulted on admission and after a discussion w/ the pt, pt's family- the pt proceeded to transition to comfort care only. Hospice was notified and no bed is available today.    ZAHAVA QUANT  TDV:[id number] DOB: 1951/06/24 DOA: 06/26/2021 PCP: Darrol Jump, NP    Assessment & Plan:   Active Problems:   Iron deficiency anemia due to chronic  blood loss   Anticentromere antibodies present   Hepatitis C virus infection without hepatic coma   Other cirrhosis of liver (HCC)   Type 2 diabetes mellitus with stage 3 chronic kidney disease (HCC)   S/P TIPS (transjugular intrahepatic portosystemic shunt)   Coronary artery disease involving native coronary artery of native heart without angina pectoris   Liver cirrhosis secondary to NASH [facility])   GI bleeding   Diabetes mellitus, type 2 (HCC)   Fracture of femur, distal, right, closed ([place])   Pressure injury of skin   Splenic infarct   CKD (chronic kidney disease) stage 4, GFR 15-29 ml/min (HCC)   Sacral wound   AKI (acute kidney injury) ([place])   Encephalopathy   Acute hepatic & metabolic encephalopathy: in setting of multiple etiologies including elevated ammonia, UTI, CAP, and sacral decubitus ulcer.  Sepsis r/o. Abxs were d/c and pt was transitioned to comfort care only   UTI: urine cx is growing e. coli. Comfort care only   AKI on CKDIIIb: baseline Cr 1.5-1.Marland Kitchen65. Cr is trending down from day prior   CAP: mild left basilar opacity as per CXR. D/c abxs. Comfort care only   Sacral decubitus: of right buttocks. Unstageable. Continue w/ wound care.  Leukocytosis: secondary to infections.   Elevated lactic acid: possibly secondary to cirrhosis.   RLE wound: continue w/ wound care    Hx of Raynauds disease: continue w/ supportive care  Cirrhosis: secondary to nash.Comfort care only   ACD: likely secondary to cirrhosis.Comfort care only   Hx of esophageal varices: s/p TIPS  Hyponatremia: likely secondary to cirrhosis.   Chronic diastolic CHF: c/c spironolactone. Comfort care only   Hx of acute splenic infarct: no anticoagulation. Comfort care only   Right lower extremity wound : present on admission.   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 Please see Dr. Alyse Low note for more information.    DVT prophylaxis: Has IVC filter Code Status: DNR  Family Communication:  Disposition Plan: hospice facility   Level of care: Progressive Cardiac  Status is: Inpatient  Remains inpatient appropriate because:Unsafe d/c plan, IV treatments appropriate due to intensity of illness or inability to take PO, and Inpatient level of care appropriate due to severity of illness  Dispo: The patient is from: Home              Anticipated d/c is to: hospice facility               Patient currently is not medically stable to d/c.   Difficult to place patient : unclear      Consultants:    Procedures:   Antimicrobials:   Subjective: Pt c/o lethargy   Objective: Vitals:   06/27/21 1507 06/27/21 1957 06/28/21 0457 06/28/21 0532  BP: (!) 103/48 (!) 110/53 (!) 101/53   Pulse: 62 62 70   Resp: _0 Temp: 98.1 F (36.7 C) (!) 97.5 F (36.4 C) 97.8 F (36.6 C)   TempSrc: Oral     SpO2: 98% 98% 99% 90%  Weight:      Height:        Intake/Output Summary (Last 24 hours) at 06/28/2021 0755 Last data filed at 06/28/2021 0651 Gross per 24 hour  Intake 1174.65 ml  Output 2825 ml  Net -1650.35 ml   Filed Weights   06/26/21 1324  Weight: 70.8 kg    Examination:  General exam: Appears uncomfortable  Respiratory system: decreased breath sounds b/l Cardiovascular system: S1/S2+. No rubs or clicks Gastrointestinal system: Abd is soft, NT, obese & normal bowel sounds  Central nervous system: Lethargic but oriented  Psychiatry: Judgement and insight appear normal. Flat mood and affect     Data Reviewed: I have personally reviewed following labs and imaging studies  CBC: Recent Labs  Lab 06/26/21 1326 06/27/21 0450 06/28/21 0430  WBC 16.8* 19.5* 11.4*  HGB 12.2 11.1* 9.7*  HCT 35.7* 33.2* 29.1*  MCV 79.3* 79.8* 80.6  PLT 235 192 580    Basic Metabolic Panel: Recent Labs  Lab 06/26/21 1326 06/27/21 0450 06/28/21 0430  NA 123* 126* 126*  K 3.7 3.9 3.7  CL 84* 90* 94*  CO2 _1 GLUCOSE 147* 152* 124*  BUN 80* 76* 73*  CREATININE 2.33* 2.11* 2.02*  CALCIUM 8.4* 8.0* 7.9*   GFR: Estimated Creatinine Clearance: 24.2 mL/min (A) (by C-G formula based on SCr of 2.02 mg/dL (H)). Liver Function Tests: Recent Labs  Lab 06/26/21 1326 06/28/21 0430  AST 27 23  ALT 15 12  ALKPHOS 209* 177*  BILITOT 1.9* 1.2  PROT 6.2* 5.0*  ALBUMIN 1.9* 1.6*   No results for input(s): LIPASE, AMYLASE in the last 168 hours. Recent Labs  Lab 06/26/21 1342 06/28/21 0430  AMMONIA 54* 58*   Coagulation Profile: Recent Labs  Lab 06/27/21 0450  INR  1.5*   Cardiac Enzymes: No results for input(s): CKTOTAL, CKMB, CKMBINDEX, TROPONINI in the last 168 hours. BNP (last 3 results) No results for input(s): PROBNP in the last 8760 hours. HbA1C: No results for input(s): HGBA1C in the last 72 hours. CBG: No results for input(s): GLUCAP in the last 168 hours. Lipid Profile: No results for input(s): CHOL, HDL, LDLCALC, TRIG, CHOLHDL, LDLDIRECT in the last 72 hours. Thyroid Function Tests: No results for input(s): TSH, T4TOTAL, FREET4, T3FREE, THYROIDAB in the last 72 hours. Anemia Panel: No results for input(s): VITAMINB12, FOLATE, FERRITIN, TIBC, IRON, RETICCTPCT in the last 72 hours. Sepsis Labs: Recent Labs  Lab 06/26/21 1326 06/26/21 1342 06/26/21 1845 06/27/21 0450 06/27/21 1531 06/27/21 1830  PROCALCITON  --  0.94 0.93 1.13  --   --   LATICACIDVEN 2.6*  --  3.2*  --  4.7* 2.7*    Recent Results (from the past 240 hour(s))  Resp Panel by RT-PCR (Flu A&B, Covid) Nasopharyngeal Swab     Status: None   Collection Time: 06/26/21  1:26 PM   Specimen: Nasopharyngeal Swab; Nasopharyngeal(NP) swabs in vial transport medium  Result Value Ref Range Status   SARS Coronavirus 2 by RT PCR NEGATIVE NEGATIVE Final     Comment: (NOTE) SARS-CoV-2 target nucleic acids are NOT DETECTED.  The SARS-CoV-2 RNA is generally detectable in upper respiratory specimens during the acute phase of infection. The lowest concentration of SARS-CoV-2 viral copies this assay can detect is 138 copies/mL. A negative result does not preclude SARS-Cov-2 infection and should not be used as the sole basis for treatment or other patient management decisions. A negative result may occur with  improper specimen collection/handling, submission of specimen other than nasopharyngeal swab, presence of viral mutation(s) within the areas targeted by this assay, and inadequate number of viral copies(<138 copies/mL). A negative result must be combined with clinical observations, patient history, and epidemiological information. The expected result is Negative.  Fact Sheet for Patients:  EntrepreneurPulse.com.au  Fact Sheet for Healthcare Providers:  IncredibleEmployment.be  This test is no t yet approved or cleared by the [place] FDA and  has been authorized for detection and/or diagnosis of SARS-CoV-2 by FDA under an Emergency Use Authorization (EUA). This EUA will remain  in effect (meaning this test can be used) for the duration of the COVID-19 declaration under Section 564(b)(1) of the Act, 21 U.S.C.section 360bbb-3(b)(1), unless the authorization is terminated  or revoked sooner.       Influenza A by PCR NEGATIVE NEGATIVE Final   Influenza B by PCR NEGATIVE NEGATIVE Final    Comment: (NOTE) The Xpert Xpress SARS-CoV-2/FLU/RSV plus assay is intended as an aid in the diagnosis of influenza from Nasopharyngeal swab specimens and should not be used as a sole basis for treatment. Nasal washings and aspirates are unacceptable for Xpert Xpress SARS-CoV-2/FLU/RSV testing.  Fact Sheet for Patients: EntrepreneurPulse.com.au  Fact Sheet for Healthcare  Providers: IncredibleEmployment.be  This test is not yet approved or cleared by the [place] FDA and has been authorized for detection and/or diagnosis of SARS-CoV-2 by FDA under an Emergency Use Authorization (EUA). This EUA will remain in effect (meaning this test can be used) for the duration of the COVID-19 declaration under Section 564(b)(1) of the Act, 21 U.S.C. section 360bbb-3(b)(1), unless the authorization is terminated or revoked.  Performed at [facility], [address] [place], [place] [address]   MRSA Next Gen by PCR, Nasal     Status: None  Collection Time: 06/28/21  4:35 AM   Specimen: Nasal Mucosa; Nasal Swab  Result Value Ref Range Status   MRSA by PCR Next Gen NOT DETECTED NOT DETECTED Final    Comment: (NOTE) The GeneXpert MRSA Assay (FDA approved for NASAL specimens only), is one component of a comprehensive MRSA colonization surveillance program. It is not intended to diagnose MRSA infection nor to guide or monitor treatment for MRSA infections. Test performance is not FDA approved in patients less than 38 years old. Performed at [facility], [address] [place], [place] [address]          Radiology Studies: DG Chest [place] 1 View  Result Date: 06/26/2021 CLINICAL DATA:  Shortness of breath EXAM: PORTABLE CHEST 1 VIEW COMPARISON:  06/11/2021 FINDINGS: There is a right chest wall power-injectable Port-A-Cath with tip in the proximal right atrium via a right internal jugular vein approach. Opacities in the right mid lung are unchanged. Normal cardiomediastinal contours. No focal consolidation. No pleural effusion or pneumothorax. Mild left basilar opacity. IMPRESSION: Mild left basilar opacity favored to be atelectasis. Electronically Signed   By: Ulyses Jarred M.D.   On: 06/26/2021 23:34        Scheduled Meds:  aspirin EC  81 mg Oral Daily   Chlorhexidine Gluconate Cloth  6 each Topical Daily    collagenase   Topical Daily   lactulose  10 g Oral TID   pantoprazole  40 mg Oral Daily   potassium chloride SA  20 mEq Oral Daily   spironolactone  100 mg Oral BID   Continuous Infusions:  azithromycin 500 mg (06/28/21 0118)   cefTRIAXone (ROCEPHIN)  IV 1 g (06/27/21 0943)   vancomycin       LOS: 1 day    Time spent: 30 mins     Wyvonnia Dusky, MD [facility] Pager [phone]  If 7PM-7AM, please contact night-coverage 06/28/2021, 7:55 AM

## 2021-06-28 NOTE — Plan of Care (Signed)
Patient alert and oriented x 4 with intermittent periods of drowsiness. Complained of severe pain "15/10" to bilateral lower extremities, hospitalist notified and provided orders for peripheral morphine. Patient asleep with follow up assess. Morning labs obtained from right chest port, remains patent with dressing clean, dry and intact. Vitals stable, 2 liters oxygen applied via nasal canula due to drop in o2 saturations with morning vitals, she is now therapeutic at >98%. Patient express desires to speak with medical provider about her current DNR status, she express "I am just tired".  No issues with elimination. Skin impairment orders completed. Stable condition at end of shift, will continue to monitor.  Problem: Education: Goal: Knowledge of General Education information will improve Description: Including pain rating scale, medication(s)/side effects and non-pharmacologic comfort measures Outcome: Progressing   Problem: Health Behavior/Discharge Planning: Goal: Ability to manage health-related needs will improve Outcome: Progressing   Problem: Clinical Measurements: Goal: Ability to maintain clinical measurements within normal limits will improve Outcome: Progressing Goal: Will remain free from infection Outcome: Progressing Goal: Diagnostic test results will improve Outcome: Progressing Goal: Respiratory complications will improve Outcome: Progressing Goal: Cardiovascular complication will be avoided Outcome: Progressing   Problem: Activity: Goal: Risk for activity intolerance will decrease Outcome: Progressing   Problem: Nutrition: Goal: Adequate nutrition will be maintained Outcome: Progressing   Problem: Coping: Goal: Level of anxiety will decrease Outcome: Progressing   Problem: Elimination: Goal: Will not experience complications related to bowel motility Outcome: Progressing Goal: Will not experience complications related to urinary retention Outcome: Progressing    Problem: Pain Managment: Goal: General experience of comfort will improve Outcome: Progressing   Problem: Safety: Goal: Ability to remain free from injury will improve Outcome: Progressing   Problem: Skin Integrity: Goal: Risk for impaired skin integrity will decrease Outcome: Progressing

## 2021-06-28 NOTE — Progress Notes (Addendum)
Manufacturing engineer [facility]  Hospital Liaison Note  Received request from [facility],  Caryl Pina for family interest in hospice home. Chart reviewed and hospice home eligibility is pending at this time. Met with patient and her significant other [name] in the room to acknowledge referral and explain services. Unfortunately hospice home does not have a bed to offer today. TOC is aware hospital liaison will follow up tomorrow or sooner if room becomes available.   Please do not hesitate to call with any hospice related questions or concerns.   Thank you for the opportunity to participate in this patient's care.   Jhonnie Garner, Therapist, sports, [facility] Liaison  [phone]

## 2021-06-28 NOTE — Consult Note (Addendum)
Consultation Note Date: 06/28/2021   Patient Name: Crystal [name]  DOB: Feb 18, 1951  MRN: [id number]  Age / Sex: 70 y.o., female  PCP: Darrol Jump, NP Referring Physician: Wyvonnia Dusky, MD  Reason for Consultation: Establishing goals of care  HPI/Patient Profile: Crystal [name] is a 70 y.o. 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  Clinical Assessment and Goals of Care: Patient is resting in bed with her significant other [name] at bedside. She lives with Crystal [name]. She has 2 daughters. She states daughter Crystal [name] is her primary HPOA and Crystal [name] is secondary.   She discusses her hip fracture and D/C to SNF. She states she has been at home most recently, and tells me about her poor and declining QOL since her DVT in May. She states prior to May, she was fully functional and independent. She enjoyed going to spend time with her friends, as she had been told previously her prognosis was poor due to her cirrhosis.   Now, she is not able to stand at the bedside, and is bed bound. She discusses pain, suffering, skin breakdown and wounds. She shows me her hands which she can barely use, and the blackened finger tips from Raynaud's. She discusses her recurrent hospitalizations and her recent hip fracture.   Crystal [name] has been well updated over the past few months. He states "its seems like you get one thing almost taken care of and something else happens." He discusses the previous DVT and now the new superficial thrombophlebitis. He understands her  esophageal varices and GIB and discusses her TIPS procedure. He discusses her splenic infarct. He discusses being told about the inability to anticoagulate due to risk of bleeding. Discussed her recurrent UTI's. He discusses her encephalopathy due to ammonia levels. She adds that she understands her status and is tired and ready to die.   We discussed her diagnoses, prognosis, GOC, EOL wishes disposition and options.  A detailed discussion was had today regarding advanced directives.  Concepts specific to code status, artifical feeding and hydration, IV antibiotics and rehospitalization were discussed.  The difference between an aggressive medical intervention path and a comfort care path was discussed.  Values and goals of care important to patient and family were attempted to be elicited.  Discussed limitations of medical interventions to prolong quality of life in some situations and discussed the concept of human mortality.  She states she simply wants to be kept comfortable until she dies. She states she is ready to stop all life prolonging measures, and shift to comfort care. She would like hospice facility placement.   Crystal [name] states she has been through a lot, with a great amount of suffering, and he can understand how she feels. He states he would  like to honor her wishes. Crystal [name] called Melissa on speaker phone. Melissa states she is understanding of her mother's wishes. Melissa states most of the family has covid and would be unable to visit her. Patient names a few people who could still come visit, which Melissa agrees with. When discussing the family members with covid she states "they can come see me in the funeral home." Crystal [name] states she will support whatever her mother wants.   I completed a MOST form today with patient and significant other at bedside, and daughter on their speak phone, and the signed original was placed in the chart. A photocopy was placed in the chart to be scanned into  EMR. The patient outlined their wishes for the following treatment decisions:  Cardiopulmonary Resuscitation: Do Not Attempt Resuscitation (DNR/No CPR)  Medical Interventions: Comfort Measures: Keep clean, warm, and dry. Use medication by any route, positioning, wound care, and other measures to relieve pain and suffering. Use oxygen, suction and manual treatment of airway obstruction as needed for comfort. Do not transfer to the hospital unless comfort needs cannot be met in current location.  Antibiotics: No antibiotics (use other measures to relieve symptoms)  IV Fluids: No IV fluids (provide other measures to ensure comfort)  Feeding Tube: No feeding tube           SUMMARY OF RECOMMENDATIONS   Patient would like hospice facility placement, and comfort care.    Prognosis:  < 2 weeks Hyponatremia, poor PO intake- albumin is 1.6. Hx of DVT and splenic infarct, and current superficial clot, unable to anticoagulate 2/2 recent GIB. UTI.  NASH cirrhosis with encephalopathy. Recent hip fx with non-op management. Wants to stop all life prolonging care.      Primary Diagnoses: Present on Admission:  Sacral wound  Anticentromere antibodies present  Coronary artery disease involving native coronary artery of native heart without angina pectoris  GI bleeding  Hepatitis C virus infection without hepatic coma  Pressure injury of skin  Iron deficiency anemia due to chronic blood loss  Other cirrhosis of liver (HCC)  Liver cirrhosis secondary to NASH ([place])  Splenic infarct  Type 2 diabetes mellitus with stage 3 chronic kidney disease (HCC)  Fracture of femur, distal, right, closed (HCC)  CKD (chronic kidney disease) stage 4, GFR 15-29 ml/min (HCC)  AKI (acute kidney injury) ([place])  Encephalopathy   I have reviewed the medical record, interviewed the patient and family, and examined the patient. The following aspects are pertinent.  Past Medical History:  Diagnosis Date   Acute GI  bleeding 02/09/2018   Acute upper gastrointestinal bleeding 08/03/2018   Anemia    TAKES IRON TAB   Arthritis    SHOULDER   B12 deficiency 02/18/2018   Bleeding    GI  3/19   Bronchitis    HX OF   CHF (congestive heart failure) (HCC)    Cirrhosis (HCC)    Colon cancer screening    Diabetes mellitus without complication (HCC)    TYPE 2   Fatty (change of) liver, not elsewhere classified    Full dentures    UPPER AND LOWER   Iron deficiency anemia due to chronic blood loss 02/18/2018   Lung nodule, multiple    Pernicious anemia 02/22/2018   PUD (peptic ulcer disease)    Raynaud's syndrome    Upper GI bleeding 08/03/2018   Social History   Socioeconomic History   Marital status: Single    Spouse name: Not on file  Number of children: Not on file   Years of education: Not on file   Highest education level: Not on file  Occupational History   Not on file  Tobacco Use   Smoking status: Never   Smokeless tobacco: Never  Vaping Use   Vaping Use: Never used  Substance and Sexual Activity   Alcohol use: Not Currently    Comment: No EtOH for 30 years.  Never a heavy drinker.   Drug use: Never   Sexual activity: Not Currently  Other Topics Concern   Not on file  Social History Narrative   Independent at baseline. Lives at home with family   Social Determinants of Health   Financial Resource Strain: Not on file  Food Insecurity: No Food Insecurity   Worried About Charity fundraiser in the Last Year: Never true   Arboriculturist in the Last Year: Never true  Transportation Needs: No Transportation Needs   Lack of Transportation (Medical): No   Lack of Transportation (Non-Medical): No  Physical Activity: Not on file  Stress: Not on file  Social Connections: Not on file   Family History  Problem Relation Age of Onset   CAD Father    Heart attack Father 27   Cancer Maternal Uncle    Seizures Mother    Scheduled Meds:  aspirin EC  81 mg Oral Daily   Chlorhexidine  Gluconate Cloth  6 each Topical Daily   collagenase   Topical Daily   lactulose  10 g Oral TID   pantoprazole  40 mg Oral Daily   potassium chloride SA  20 mEq Oral Daily   spironolactone  100 mg Oral BID   Continuous Infusions:  azithromycin 500 mg (06/28/21 0118)   vancomycin     PRN Meds:.acetaminophen **OR** acetaminophen, hydrOXYzine, morphine injection, ondansetron **OR** ondansetron (ZOFRAN) IV Medications Prior to Admission:  Prior to Admission medications   Medication Sig Start Date End Date Taking? Authorizing Provider  acetaminophen (TYLENOL) 500 MG tablet Take 1 tablet (500 mg total) by mouth every 6 (six) hours as needed for mild pain, moderate pain, fever or headache. 06/02/21  Yes Val Riles, MD  aspirin EC 81 MG EC tablet Take 1 tablet (81 mg total) by mouth daily. Swallow whole. 06/12/21  Yes Tat, Shanon Brow, MD  furosemide (LASIX) 80 MG tablet Take 80 mg by mouth 2 (two) times daily. 04/22/21  Yes [provider]  gabapentin (NEURONTIN) 100 MG capsule Take 200 mg by mouth 2 (two) times daily. For 3 days   Yes [provider]  hydrOXYzine (ATARAX/VISTARIL) 25 MG tablet Take 1 tablet (25 mg total) by mouth 2 (two) times daily as needed. Patient taking differently: Take 25 mg by mouth 3 (three) times daily. 04/08/21  Yes Kathie Dike, MD  lactulose (CHRONULAC) 10 GM/15ML solution TAKE 15 MLS (10 G TOTAL) BY MOUTH 3 (THREE) TIMES DAILY. 06/14/21  Yes Vanga, Tally Due, MD  metolazone (ZAROXOLYN) 5 MG tablet Take 1 tablet (5 mg total) by mouth every other day. Patient taking differently: Take 5 mg by mouth daily. 05/20/21 09/17/21 Yes End, Harrell Gave, MD  ondansetron (ZOFRAN) 4 MG tablet Take 4 mg by mouth every 8 (eight) hours as needed for nausea or vomiting.   Yes [provider]  oxyCODONE (OXY IR/ROXICODONE) 5 MG immediate release tablet Take 5 mg by mouth every 6 (six) hours as needed. 06/22/21  Yes [provider]  pantoprazole (PROTONIX) 40  MG tablet Take 1 tablet (  40 mg total) by mouth 2 (two) times daily. Patient taking differently: Take 40 mg by mouth daily. 05/01/21 06/26/21 Yes Sreenath, Sudheer B, MD  potassium chloride SA (KLOR-CON) 20 MEQ tablet Take 1 tablet (20 mEq total) by mouth daily. Patient taking differently: Take 20 mEq by mouth 2 (two) times daily. 06/02/21 09/30/21 Yes Val Riles, MD  rifaximin (XIFAXAN) 550 MG TABS tablet Take 550 mg by mouth 2 (two) times daily.   Yes [provider]  spironolactone (ALDACTONE) 100 MG tablet TAKE 1 TABLET BY MOUTH TWICE A DAY 06/14/21  Yes Vanga, Tally Due, MD  Vitamin D, Ergocalciferol, (DRISDOL) 1.25 MG (50000 UNIT) CAPS capsule Take 50,000 Units by mouth every Sunday. 01/14/21  Yes [provider]  furosemide (LASIX) 80 MG tablet Take 0.5 tablets (40 mg total) by mouth daily. Patient not taking: Reported on 06/26/2021 06/02/21   Val Riles, MD   No Known Allergies Review of Systems  Constitutional:  Positive for fatigue.  Musculoskeletal:        Joint pain.   Skin:        Pain at wound sites.    Physical Exam Pulmonary:     Effort: Pulmonary effort is normal.  Neurological:     Mental Status: She is alert.    Vital Signs: BP (!) 114/56 (BP Location: Left Arm)   Pulse 66   Temp 98.1 F (36.7 C)   Resp 18   Ht 5' 2" (1.575 m)   Wt 70.8 kg   LMP  (LMP Unknown)   SpO2 (!) 74%   BMI 28.53 kg/m  Pain Scale: 0-10   Pain Score: 2    SpO2: SpO2: (!) 74 % O2 Device:SpO2: (!) 74 % O2 Flow Rate: .O2 Flow Rate (L/min): 2 L/min  IO: Intake/output summary:  Intake/Output Summary (Last 24 hours) at 06/28/2021 1249 Last data filed at 06/28/2021 1448 Gross per 24 hour  Intake 694.65 ml  Output 2500 ml  Net -1805.35 ml    LBM: Last BM Date: 06/27/21 Baseline Weight: Weight: 70.8 kg Most recent weight: Weight: 70.8 kg       Time In: 11:00 Time Out: 12:10 Time Total: 70 min Greater than 50%  of this time was spent counseling and  coordinating care related to the above assessment and plan.  Signed by: Asencion Gowda, NP   Please contact Palliative Medicine Team phone at [phone] for questions and concerns.  For individual provider: See Shea Evans

## 2021-06-29 LAB — URINE CULTURE: Culture: >=100000 — AB

## 2021-06-29 MED ORDER — POLYVINYL ALCOHOL 1.4 % OP SOLN: 1.0000 [drp] | Freq: Four times a day (QID) | OPHTHALMIC | 0 refills | Status: AC | PRN

## 2021-06-29 MED ORDER — HALOPERIDOL 0.5 MG PO TABS: 0.5000 mg | ORAL_TABLET | ORAL | Status: AC | PRN

## 2021-06-29 MED ORDER — LORAZEPAM 2 MG/ML PO CONC: 1.0000 mg | ORAL | 0 refills | Status: AC | PRN

## 2021-06-29 MED ORDER — LORAZEPAM 1 MG PO TABS: 1.0000 mg | ORAL_TABLET | ORAL | 0 refills | Status: AC | PRN

## 2021-06-29 MED ORDER — HALOPERIDOL LACTATE 2 MG/ML PO CONC: 0.6000 mg | ORAL | 0 refills | Status: AC | PRN

## 2021-06-29 NOTE — TOC Progression Note (Signed)
Transition of Care [facility]) - Progression Note    Patient Details  Name: Crystal [name] MRN: [id number] Date of Birth: 1951/11/10  Transition of Care [facility]) CM/SW [place], [place] Phone Number: 06/29/2021, 9:00 AM  Clinical Narrative:     CSW notes patient is now residential hospice appropriate, pending bed availability with [facility] at this time.        Expected Discharge Plan and Services                                                 Social Determinants of Health (SDOH) Interventions    Readmission Risk Interventions Readmission Risk Prevention Plan 04/28/2021 04/07/2021  Transportation Screening Complete Complete  Medication Review Press photographer) Complete Complete  PCP or Specialist appointment within 3-5 days of discharge - Complete  HRI or [place] Complete Complete  SW Recovery Care/Counseling Consult - Complete  Palliative Care Screening Not Applicable Not [place] Not Applicable Not Applicable  Some recent data might be hidden

## 2021-06-29 NOTE — Progress Notes (Signed)
Patient discharging to [facility] , transported via [facility] transport service. Family aware at bedside at time of d/c.  Kimberlea Schlag, Tivis Ringer, RN

## 2021-06-29 NOTE — TOC Transition Note (Addendum)
Transition of Care [facility]) - CM/SW Discharge Note   Patient Details  Name: Crystal [name] MRN: [id number] Date of Birth: [birth year]  Transition of Care [facility]) CM/SW Contact:  Alberteen Sam, LCSW Phone Number: 06/29/2021, 10:23 AM   Clinical Narrative:     Patient will DC to: Hospice Home Anticipated DC date: 06/29/21 Family notified: [name] Transport by: [facility]  Per MD patient ready for DC to Hospice Home . RN, patient, patient's family, and facility notified of DC. Discharge Summary sent to facility. RN given number for report [phone]  . DC packet on chart. [facility] transport requested for patient for 5:00 pm.  CSW signing off.  Pricilla Riffle, LCSW    Final next level of care: Hospice Medical Facility Barriers to Discharge: No Barriers Identified   Patient Goals and CMS Choice   CMS Medicare.gov Compare Post Acute Care list provided to:: Patient Represenative (must comment) (spoue [name]) Choice offered to / list presented to : Spouse  Discharge Placement              Patient chooses bed at:  [facility]) Patient to be transferred to facility by: [facility] Name of family member notified: spoue [name] Patient and family notified of of transfer: 06/29/21  Discharge Plan and Services                                     Social Determinants of Health (SDOH) Interventions     Readmission Risk Interventions Readmission Risk Prevention Plan 04/28/2021 04/07/2021  Transportation Screening Complete Complete  Medication Review Press photographer) Complete Complete  PCP or Specialist appointment within 3-5 days of discharge - Complete  HRI or Home Care Consult Complete Complete  SW Recovery Care/Counseling Consult - Complete  Palliative Care Screening Not Applicable Not [place] Not Applicable Not Applicable  Some recent data might be hidden

## 2021-06-29 NOTE — Progress Notes (Signed)
[facility]) Hospital Liaison RN Note  Hospice Home is able to offer a room to the patient today. Spoke with daughter, [name] and she is agreeable to transfer today. Pricilla Riffle, LCSW [facility] Manager aware.   RN please call report to [place] at [phone]. Please send signed and completed DNR with patient at discharge.   Please do not hesitate to call with any hospice related questions.   Thank you,   Bobbie "Loren Racer, [place], BSN [facility] Liaison [phone]

## 2021-06-29 NOTE — Progress Notes (Signed)
Report called to Hospice nurse Vela Prose, Tivis Ringer, RN

## 2021-06-29 NOTE — Discharge Summary (Signed)
Physician Discharge Summary   Disney A Deangelo  female DOB: 01-14-1951  IDP:[id number]  PCP: Darrol Jump, NP  Admit date: 06/26/2021 Discharge date: 06/29/2021  Admitted From: home Disposition:  hospice facility CODE STATUS: DNR  Discharge Instructions     Discharge wound care:   Complete by: As directed    Wound care  Every shift      Apply iodine from the swabsticks or swab pads from clean utility to left great toe tip purple spot, Left heel discolored area.  Allow to air dry. [facility] Course:  For full details, please see H&P, progress notes, consult notes and ancillary notes.  Briefly,  Lyndon A Coburn is a 70 y.o. 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  Comfort care measures  Acute hepatic & metabolic encephalopathy:  in setting of multiple etiologies including elevated ammonia, UTI, CAP, and sacral decubitus ulcer.  Sepsis r/o. Abxs were d/c and pt was transitioned to comfort care only    UTI:  urine cx is growing e. coli. Comfort care only   AKI on CKDIIIb:  baseline Cr 1.5-1.65. Cr is trending down.   CAP:  mild left basilar opacity as per CXR. D/c abxs. Comfort care only    Sacral decubitus:  of right buttocks. Unstageable. Continue w/ wound care.   Leukocytosis:  secondary to infections.    Elevated lactic acid:  possibly secondary to cirrhosis.    RLE wound:  continue w/ wound care     Hx of Raynauds disease:  continue w/ supportive care   Cirrhosis:  secondary to nash.Comfort care only    ACD:  likely secondary to cirrhosis.Comfort care only    Hx of  esophageal varices:  s/p TIPS   Hyponatremia:  likely secondary to cirrhosis.   Chronic diastolic CHF:  c/c spironolactone. Comfort care only   Hx of acute splenic infarct:  no anticoagulation. Comfort care only    Right lower extremity wound : present on admission.    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      Discharge Diagnoses:  Active Problems:   Iron deficiency anemia due to chronic blood loss   Anticentromere antibodies present   Hepatitis C virus infection without hepatic coma   Other cirrhosis of liver (HCC)   Type 2 diabetes mellitus with stage 3 chronic kidney disease (HCC)   S/P TIPS (transjugular intrahepatic portosystemic shunt)   Coronary artery disease involving native coronary artery of native heart without angina pectoris   Liver cirrhosis secondary to NASH [facility])   GI bleeding   Diabetes mellitus, type 2 (HCC)   Fracture of femur, distal, right, closed ([place])   Pressure injury of skin   Splenic infarct   CKD (chronic kidney disease) stage 4, GFR 15-29 ml/min (HCC)   Sacral wound   AKI (acute kidney injury) ([place])   Encephalopathy   30 Day Unplanned Readmission Risk Score    Flowsheet Row ED to  Hosp-Admission (Current) from 06/26/2021 in [facility]  30 Day Unplanned Readmission Risk Score (%) 48.81 Filed at 06/29/2021 0801       This score is the patient's risk of an unplanned readmission within 30 days of being discharged (0 -100%). The score is based on dignosis, age, lab data, medications, orders, and past utilization.   Low:  0-14.9   Medium: 15-21.9   High: 22-29.9   Extreme: 30 and above         Discharge Instructions:  Allergies as of 06/29/2021   No Known Allergies      Medication List     STOP  taking these medications    acetaminophen 500 MG tablet Commonly known as: TYLENOL   aspirin 81 MG EC tablet   furosemide 80 MG tablet Commonly known as: LASIX   hydrOXYzine 25 MG tablet Commonly known as: ATARAX/VISTARIL   lactulose 10 GM/15ML solution Commonly known as: CHRONULAC   metolazone 5 MG tablet Commonly known as: ZAROXOLYN   pantoprazole 40 MG tablet Commonly known as: PROTONIX   potassium chloride SA 20 MEQ tablet Commonly known as: KLOR-CON   Vitamin D (Ergocalciferol) 1.25 MG (50000 UNIT) Caps capsule Commonly known as: DRISDOL       TAKE these medications    gabapentin 100 MG capsule Commonly known as: NEURONTIN Take 200 mg by mouth 2 (two) times daily. For 3 days   haloperidol 0.5 MG tablet Commonly known as: HALDOL Take 1 tablet (0.5 mg total) by mouth every 4 (four) hours as needed for agitation (or delirium).   haloperidol 2 MG/ML solution Commonly known as: HALDOL Place 0.3 mLs (0.6 mg total) under the tongue every 4 (four) hours as needed for agitation (or delirium).   LORazepam 1 MG tablet Commonly known as: ATIVAN Take 1 tablet (1 mg total) by mouth every 4 (four) hours as needed for anxiety.   LORazepam 2 MG/ML concentrated solution Commonly known as: ATIVAN Place 0.5 mLs (1 mg total) under the tongue every 4 (four) hours as needed for anxiety.   ondansetron 4 MG tablet Commonly known as: ZOFRAN Take 4 mg by mouth every 8 (eight) hours as needed for nausea or vomiting.   oxyCODONE 5 MG immediate release tablet Commonly known as: Oxy IR/ROXICODONE Take 5 mg by mouth every 6 (six) hours as needed.   polyvinyl alcohol 1.4 % ophthalmic solution Commonly known as: LIQUIFILM TEARS Place 1 drop into both eyes 4 (four) times daily as needed for dry eyes.   rifaximin 550 MG Tabs tablet Commonly known as: XIFAXAN Take 550 mg by mouth 2 (two) times daily.   spironolactone 100 MG tablet Commonly known as: ALDACTONE TAKE 1 TABLET BY  MOUTH TWICE A DAY               Discharge Care Instructions  (From admission, onward)           Start     Ordered   06/29/21 0000  Discharge wound care:       Comments: Wound care  Every shift      Apply iodine from the swabsticks or swab pads from clean utility to left great toe tip purple spot, Left heel discolored area.  Allow to air dry. - -   06/29/21 1019              No Known Allergies   The results of significant diagnostics from this hospitalization (including imaging, microbiology, ancillary and laboratory) are listed below  for reference.   Consultations:   Procedures/Studies: CT ABDOMEN PELVIS W CONTRAST  Result Date: 06/09/2021 CLINICAL DATA:  Abdominal pain EXAM: CT ABDOMEN AND PELVIS WITH CONTRAST TECHNIQUE: Multidetector CT imaging of the abdomen and pelvis was performed using the standard protocol following bolus administration of intravenous contrast. CONTRAST:  56m OMNIPAQUE IOHEXOL 300 MG/ML  SOLN COMPARISON:  CT abdomen and pelvis dated August 05, 2018 FINDINGS: Lower chest: Moderate loculated right pleural effusion with atelectasis. Trace left pleural effusion. Cardiomegaly with coronary artery calcifications. No pericardial effusion. Hepatobiliary: No suspicious focal liver lesions tips shunt is in place and appears patent. Cholelithiasis no evidence of acute cholecystitis. No biliary ductal dilation. Pancreas: Embolization coils noted near the pancreatic head. Pancreas is unremarkable. Spleen: Splenomegaly. Wedge-shaped peripheral area of hypoattenuation seen in the spleen compatible with splenic infarct. Adrenals/Urinary Tract: Bilateral adrenal glands are unremarkable. Bilateral kidneys are normal in appearance. Normal bladder. Stomach/Bowel: Bowel is normal in caliber with no evidence of obstruction or wall thickening. Normal appendix. Normal appearance of the stomach. Vascular/Lymphatic: Aortic atherosclerotic disease no pathologically  enlarged nodes seen in the abdomen or pelvis. IVC filter noted in the infrarenal IVC. Reproductive: Uterus is present. Cystic lesion the left adnexa, measuring up to 4.1 cm, was previously seen on prior CT when it measured to 6.5 cm. Other: No abdominal ascites.  Now free intraperitoneal air. Soft tissue anasarca Musculoskeletal: Grade 1 anterolisthesis of L4 on L5. No acute osseous abnormalities. IMPRESSION: New splenic infarct. Patent TIPS. Partially visualized moderate loculated right pleural effusion. Electronically Signed   By: LYetta GlassmanMD   On: 06/09/2021 14:39   DG Chest Port 1 View  Result Date: 06/26/2021 CLINICAL DATA:  Shortness of breath EXAM: PORTABLE CHEST 1 VIEW COMPARISON:  06/11/2021 FINDINGS: There is a right chest wall power-injectable Port-A-Cath with tip in the proximal right atrium via a right internal jugular vein approach. Opacities in the right mid lung are unchanged. Normal cardiomediastinal contours. No focal consolidation. No pleural effusion or pneumothorax. Mild left basilar opacity. IMPRESSION: Mild left basilar opacity favored to be atelectasis. Electronically Signed   By: KUlyses JarredM.D.   On: 06/26/2021 23:34   DG CHEST PORT 1 VIEW  Result Date: 06/11/2021 CLINICAL DATA:  Dyspnea. EXAM: PORTABLE CHEST 1 VIEW COMPARISON:  Single-view of the chest 05/28/2021. FINDINGS: Port-A-Cath is unchanged. Right basilar atelectasis seen on the prior exam appears. Trace bilateral pleural effusions are unchanged. Heart size is normal. No pneumothorax. IMPRESSION: Improved right basilar atelectasis. Very small bilateral pleural effusions. Electronically Signed   By: TInge RiseM.D.   On: 06/11/2021 15:48   EEG adult  Result Date: 05/30/2021 KGreta Doom MD     05/30/2021  3:00 PM History: 70yo F with AMS Sedation: None Technique: This is a 21 channel routine scalp EEG performed at the bedside with bipolar and monopolar montages arranged in accordance to the  international 10/20 system of electrode placement. One channel was dedicated to EKG recording. Background: There is a well-formed posterior dominant rhythm of 8 Hz which is reactive to eye opening/closure.  In addition there is intrusion into the background of irregular low voltage delta and theta activity even during maximal wakefulness.  With drowsiness, there is prominent frontally predominant intermittent rhythmic delta activity(FIRDA). Photic stimulation: Physiologic driving is not performed EEG Abnormalities: 1) FIRDA 2) Generalized irregular slow activity. Clinical Interpretation: This EEG is consistent with a mild generalized nonspecific cerebral dysfunction (encephalopathy). There was no seizure or seizure predisposition recorded on  this study. Please note that lack of epileptiform activity on EEG does not preclude the possibility of epilepsy. Roland Rack, MD [facility] [phone] If 7pm- 7am, please page neurology on call as listed in [place].   ECHOCARDIOGRAM LIMITED  Result Date: 06/10/2021    ECHOCARDIOGRAM LIMITED REPORT   Patient Name:   Denali A Cannaday Date of Exam: 06/10/2021 Medical Rec #:  [id number]    Height:       62.0 in Accession #:    [id number]   Weight:       192.0 lb Date of Birth:  [birth year]    BSA:          1.879 m Patient Age:    70 years     BP:           118/53 mmHg Patient Gender: F            HR:           72 bpm. Exam Location:  [facility] Procedure: Limited Echo Indications:    Splenic Infarct  History:        Patient has prior history of Echocardiogram examinations, most                 recent 12/14/2020. CHF, CAD; Risk Factors:Diabetes.  Sonographer:    Wenda Low Referring Phys: Uriah  1. Left ventricular ejection fraction, by estimation, is 60 to 65%. The left ventricle has normal function. The left ventricle has no regional wall motion abnormalities. There is moderate left ventricular hypertrophy.  2. The aortic valve  is tricuspid.  3. The inferior vena cava is normal in size with greater than 50% respiratory variability, suggesting right atrial pressure of 3 mmHg.  4. Limited echo for splenic infarct, evaluate potential thromboembolic source. No evidence of intracardiac course from this limtied echo. FINDINGS  Left Ventricle: Left ventricular ejection fraction, by estimation, is 60 to 65%. The left ventricle has normal function. The left ventricle has no regional wall motion abnormalities. The left ventricular internal cavity size was normal in size. There is  moderate left ventricular hypertrophy. Left Atrium: Left atrial size was normal in size. Right Atrium: Right atrial size was normal in size. Pericardium: There is no evidence of pericardial effusion. Mitral Valve: Mild mitral annular calcification. Aortic Valve: The aortic valve is tricuspid. Aorta: The aortic root is normal in size and structure. Venous: The inferior vena cava is normal in size with greater than 50% respiratory variability, suggesting right atrial pressure of 3 mmHg. LEFT VENTRICLE PLAX 2D LVIDd:         4.41 cm LVIDs:         2.44 cm LV PW:         1.32 cm LV IVS:        1.45 cm LVOT diam:     2.00 cm LVOT Area:     3.14 cm  LEFT ATRIUM         Index LA diam:    4.10 cm 2.18 cm/m   AORTA Ao Root diam: 3.70 cm  SHUNTS Systemic Diam: 2.00 cm Carlyle Dolly MD Electronically signed by Carlyle Dolly MD Signature Date/Time: 06/10/2021/1:01:11 PM    Final       Labs: BNP (last 3 results) Recent Labs    12/02/20 1943 12/03/20 1630 04/27/21 0945  BNP 434.5* 514.6* 176.1*   Basic Metabolic Panel: Recent Labs  Lab 06/26/21 1326 06/27/21 0450 06/28/21 0430  NA 123* 126* 126*  K 3.7 3.9 3.7  CL 84* 90* 94*  CO2 _0 GLUCOSE 147* 152* 124*  BUN 80* 76* 73*  CREATININE 2.33* 2.11* 2.02*  CALCIUM 8.4* 8.0* 7.9*   Liver Function Tests: Recent Labs  Lab 06/26/21 1326 06/28/21 0430  AST 27 23  ALT 15 12  ALKPHOS 209* 177*   BILITOT 1.9* 1.2  PROT 6.2* 5.0*  ALBUMIN 1.9* 1.6*   No results for input(s): LIPASE, AMYLASE in the last 168 hours. Recent Labs  Lab 06/26/21 1342 06/28/21 0430  AMMONIA 54* 58*   CBC: Recent Labs  Lab 06/26/21 1326 06/27/21 0450 06/28/21 0430  WBC 16.8* 19.5* 11.4*  HGB 12.2 11.1* 9.7*  HCT 35.7* 33.2* 29.1*  MCV 79.3* 79.8* 80.6  PLT 235 192 159   Cardiac Enzymes: No results for input(s): CKTOTAL, CKMB, CKMBINDEX, TROPONINI in the last 168 hours. BNP: Invalid input(s): POCBNP CBG: No results for input(s): GLUCAP in the last 168 hours. D-Dimer No results for input(s): DDIMER in the last 72 hours. Hgb A1c No results for input(s): HGBA1C in the last 72 hours. Lipid Profile No results for input(s): CHOL, HDL, LDLCALC, TRIG, CHOLHDL, LDLDIRECT in the last 72 hours. Thyroid function studies No results for input(s): TSH, T4TOTAL, T3FREE, THYROIDAB in the last 72 hours.  Invalid input(s): FREET3 Anemia work up No results for input(s): VITAMINB12, FOLATE, FERRITIN, TIBC, IRON, RETICCTPCT in the last 72 hours. Urinalysis    Component Value Date/Time   COLORURINE YELLOW (A) 06/26/2021 1326   APPEARANCEUR HAZY (A) 06/26/2021 1326   LABSPEC 1.009 06/26/2021 1326   PHURINE 6.0 06/26/2021 1326   GLUCOSEU NEGATIVE 06/26/2021 1326   HGBUR NEGATIVE 06/26/2021 1326   BILIRUBINUR NEGATIVE 06/26/2021 1326   KETONESUR NEGATIVE 06/26/2021 1326   PROTEINUR NEGATIVE 06/26/2021 1326   NITRITE NEGATIVE 06/26/2021 1326   LEUKOCYTESUR LARGE (A) 06/26/2021 1326   Sepsis Labs Invalid input(s): PROCALCITONIN,  WBC,  LACTICIDVEN Microbiology Recent Results (from the past 240 hour(s))  Resp Panel by RT-PCR (Flu A&B, Covid) Nasopharyngeal Swab     Status: None   Collection Time: 06/26/21  1:26 PM   Specimen: Nasopharyngeal Swab; Nasopharyngeal(NP) swabs in vial transport medium  Result Value Ref Range Status   SARS Coronavirus 2 by RT PCR NEGATIVE NEGATIVE Final    Comment:  (NOTE) SARS-CoV-2 target nucleic acids are NOT DETECTED.  The SARS-CoV-2 RNA is generally detectable in upper respiratory specimens during the acute phase of infection. The lowest concentration of SARS-CoV-2 viral copies this assay can detect is 138 copies/mL. A negative result does not preclude SARS-Cov-2 infection and should not be used as the sole basis for treatment or other patient management decisions. A negative result may occur with  improper specimen collection/handling, submission of specimen other than nasopharyngeal swab, presence of viral mutation(s) within the areas targeted by this assay, and inadequate number of viral copies(<138 copies/mL). A negative result must be combined with clinical observations, patient history, and epidemiological information. The expected result is Negative.  Fact Sheet for Patients:  EntrepreneurPulse.com.au  Fact Sheet for Healthcare Providers:  IncredibleEmployment.be  This test is no t yet approved or cleared by the [place] FDA and  has been authorized for detection and/or diagnosis of SARS-CoV-2 by FDA under an Emergency Use Authorization (EUA). This EUA will remain  in effect (meaning this test can be used) for the duration of the COVID-19 declaration under Section 564(b)(1) of the Act, 21 U.S.C.section 360bbb-3(b)(1), unless the authorization is terminated  or revoked  sooner.       Influenza A by PCR NEGATIVE NEGATIVE Final   Influenza B by PCR NEGATIVE NEGATIVE Final    Comment: (NOTE) The Xpert Xpress SARS-CoV-2/FLU/RSV plus assay is intended as an aid in the diagnosis of influenza from Nasopharyngeal swab specimens and should not be used as a sole basis for treatment. Nasal washings and aspirates are unacceptable for Xpert Xpress SARS-CoV-2/FLU/RSV testing.  Fact Sheet for Patients: EntrepreneurPulse.com.au  Fact Sheet for Healthcare  Providers: IncredibleEmployment.be  This test is not yet approved or cleared by the [place] FDA and has been authorized for detection and/or diagnosis of SARS-CoV-2 by FDA under an Emergency Use Authorization (EUA). This EUA will remain in effect (meaning this test can be used) for the duration of the COVID-19 declaration under Section 564(b)(1) of the Act, 21 U.S.C. section 360bbb-3(b)(1), unless the authorization is terminated or revoked.  Performed at [facility], [address] [place], [place] [address]   Urine Culture     Status: Abnormal   Collection Time: 06/26/21  1:26 PM   Specimen: Urine, Random  Result Value Ref Range Status   Specimen Description   Final    URINE, RANDOM Performed at [facility], [address] [place], [place] [address]    Special Requests   Final    NONE Performed at [facility], [place], [place] [address]    Culture >=100,000 COLONIES/mL ESCHERICHIA COLI (A)  Final   Report Status 06/29/2021 FINAL  Final   Organism ID, Bacteria ESCHERICHIA COLI (A)  Final      Susceptibility   Escherichia coli - MIC*    AMPICILLIN >=32 RESISTANT Resistant     CEFAZOLIN >=64 RESISTANT Resistant     CEFEPIME <=0.12 SENSITIVE Sensitive     CEFTRIAXONE 32 RESISTANT Resistant     CIPROFLOXACIN <=0.25 SENSITIVE Sensitive     GENTAMICIN <=1 SENSITIVE Sensitive     IMIPENEM <=0.25 SENSITIVE Sensitive     NITROFURANTOIN <=16 SENSITIVE Sensitive     TRIMETH/SULFA <=20 SENSITIVE Sensitive     AMPICILLIN/SULBACTAM >=32 RESISTANT Resistant     PIP/TAZO 8 SENSITIVE Sensitive     * >=100,000 COLONIES/mL ESCHERICHIA COLI  MRSA Next Gen by PCR, Nasal     Status: None   Collection Time: 06/28/21  4:35 AM   Specimen: Nasal Mucosa; Nasal Swab  Result Value Ref Range Status   MRSA by PCR Next Gen NOT DETECTED NOT DETECTED Final    Comment: (NOTE) The GeneXpert MRSA Assay (FDA approved for NASAL  specimens only), is one component of a comprehensive MRSA colonization surveillance program. It is not intended to diagnose MRSA infection nor to guide or monitor treatment for MRSA infections. Test performance is not FDA approved in patients less than 37 years old. Performed at [facility], [address] [place], [place] [address]      Total time spend on discharging this patient, including the last patient exam, discussing the hospital stay, instructions for ongoing care as it relates to all pertinent caregivers, as well as preparing the medical discharge records, prescriptions, and/or referrals as applicable, is 30 minutes.    Enzo Bi, MD  [facility] 06/29/2021, 10:19 AM

## 2021-06-29 NOTE — Plan of Care (Signed)

## 2021-07-01 ENCOUNTER — Other Ambulatory Visit: Payer: Self-pay | Admitting: *Deleted

## 2021-07-01 NOTE — Patient Outreach (Addendum)
[name]) Care Management  07/01/2021  Crystal [name] [birth year] [id number]  Case Closure  Pt has been placed at the hospice facility in [place]. Confirmed by [facility] with admission on 8/17 post discharged from [facility].   Case closure as pt no longer eligible for [facility] services with comfort measures only.  Raina Mina, RN Care Management Coordinator [facility] [phone]

## 2021-07-08 ENCOUNTER — Other Ambulatory Visit: Payer: Self-pay | Admitting: Family

## 2021-07-11 ENCOUNTER — Ambulatory Visit: Payer: Medicare HMO | Admitting: Gastroenterology

## 2021-07-14 DEATH — deceased

## 2021-07-19 ENCOUNTER — Other Ambulatory Visit: Payer: Self-pay

## 2021-07-29 ENCOUNTER — Encounter: Payer: Self-pay | Admitting: Hematology and Oncology

## 2021-08-08 ENCOUNTER — Other Ambulatory Visit: Payer: Self-pay | Admitting: Interventional Radiology

## 2021-08-08 DIAGNOSIS — Z95828 Presence of other vascular implants and grafts: Secondary | ICD-10-CM

## 2021-08-10 ENCOUNTER — Other Ambulatory Visit: Payer: Medicare HMO

## 2021-08-11 ENCOUNTER — Ambulatory Visit: Payer: Medicare HMO

## 2021-08-11 ENCOUNTER — Ambulatory Visit: Payer: Medicare HMO | Admitting: Oncology

## 2022-05-18 IMAGING — US US EXTREM LOW VENOUS
1 series · 12 of 24 positions shown · non-contrast
Comparison: None.

CLINICAL DATA: Lower extremity edema

EXAM:
BILATERAL LOWER EXTREMITY VENOUS DUPLEX ULTRASOUND
TECHNIQUE: Gray-scale sonography with graded compression, as well as color
Doppler and duplex ultrasound were performed to evaluate the lower
extremity deep venous systems from the level of the common femoral
vein and including the common femoral, femoral, profunda femoral,
popliteal and calf veins including the posterior tibial, peroneal
and gastrocnemius veins when visible. The superficial great
saphenous vein was also interrogated. Spectral Doppler was utilized
to evaluate flow at rest and with distal augmentation maneuvers in
the common femoral, femoral and popliteal veins.

[Series 1: us extrem low venous · 0.08mm/px · 12 of 62 slices shown]
[im 3/62]
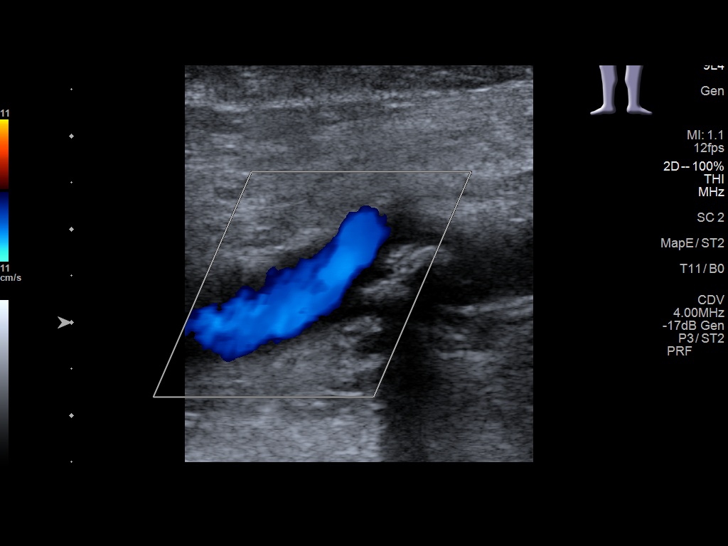
[im 8/62]
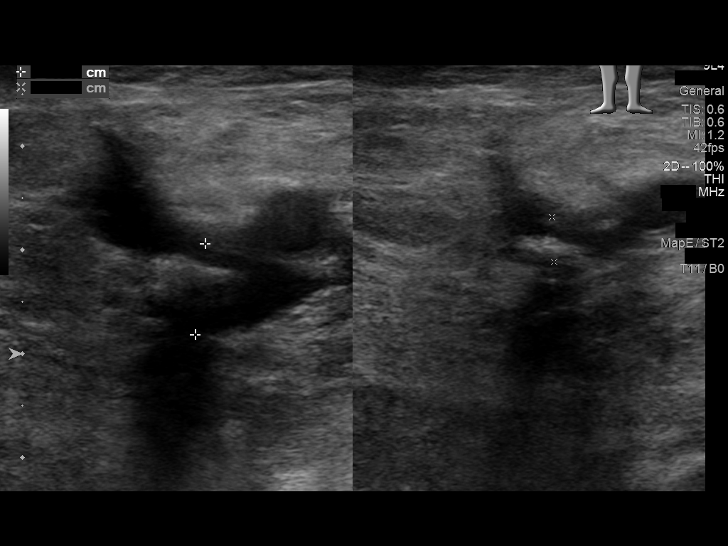
[im 14/62]
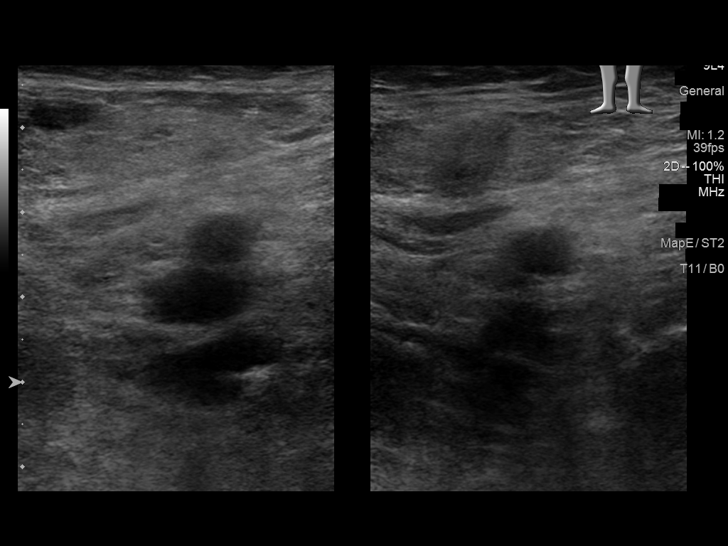
[im 19/62]
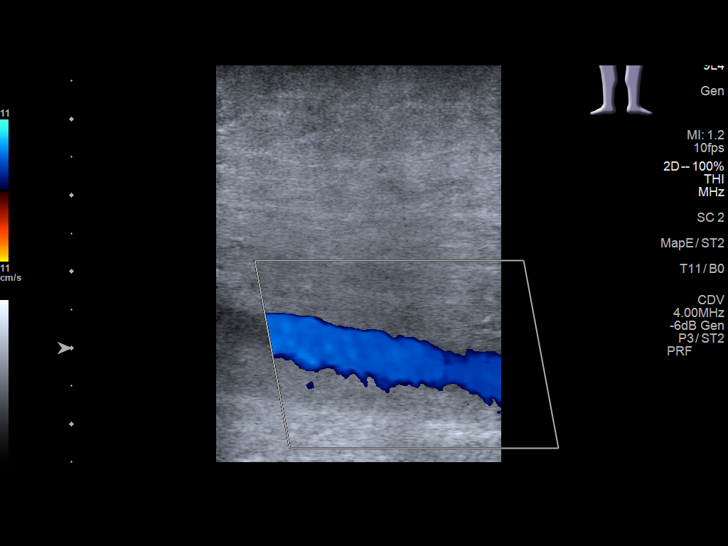
[im 24/62]
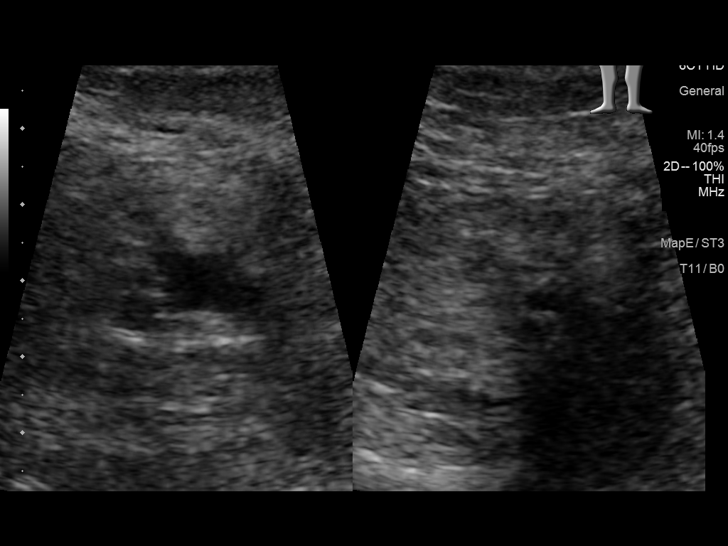
[im 30/62]
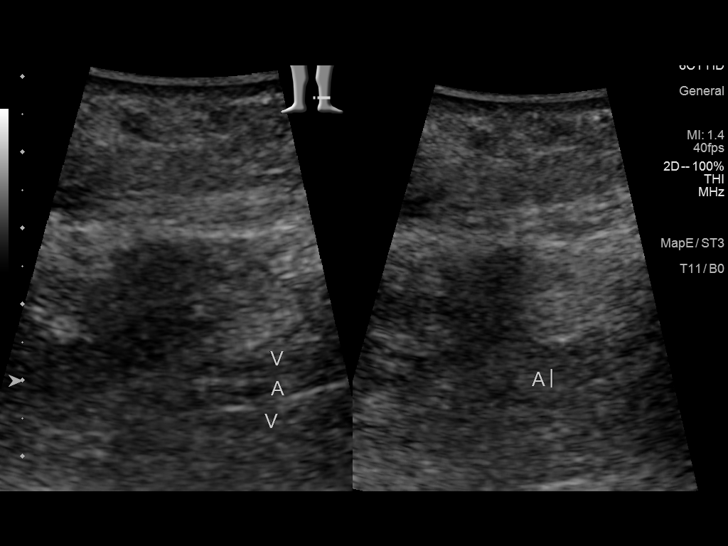
[im 35/62]
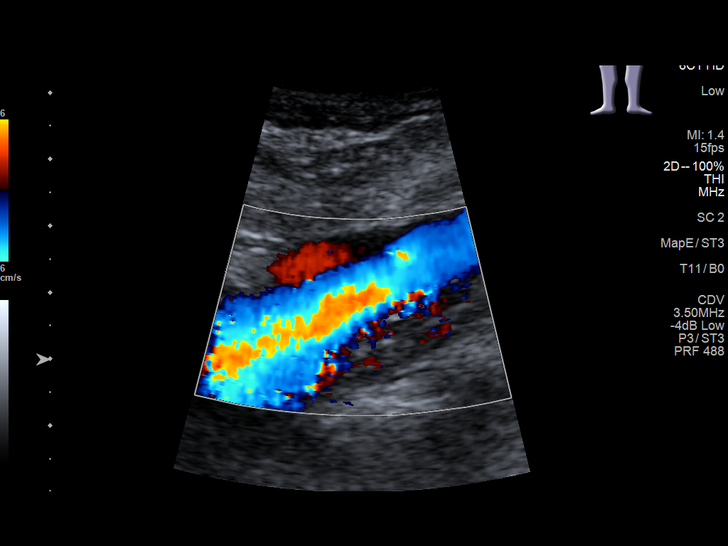
[im 40/62]
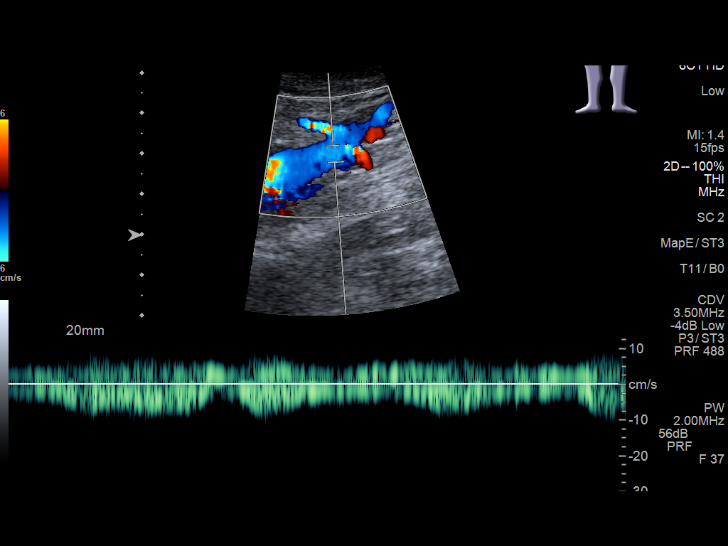
[im 46/62]
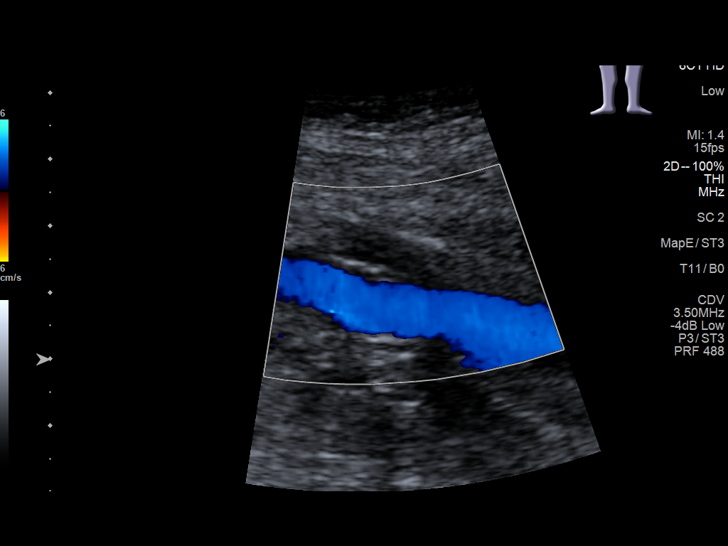
[im 51/62]
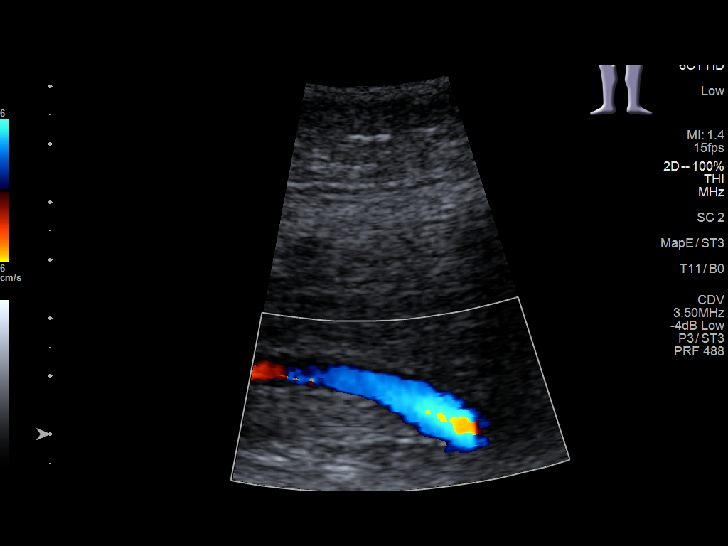
[im 56/62]
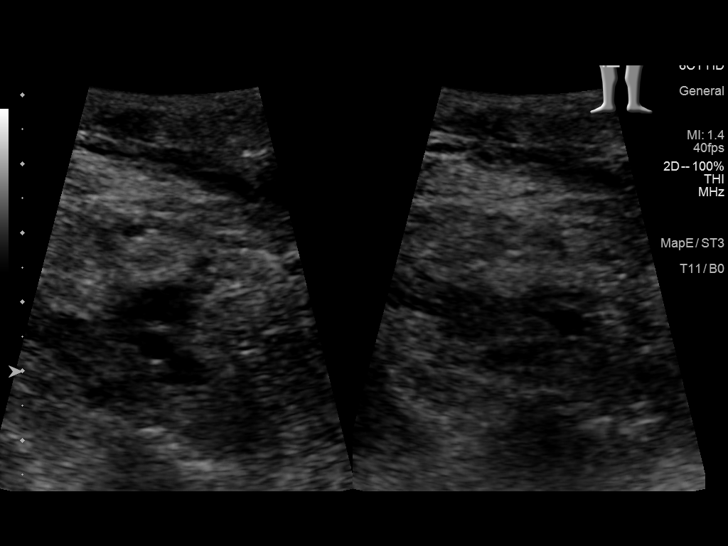
[im 62/62]
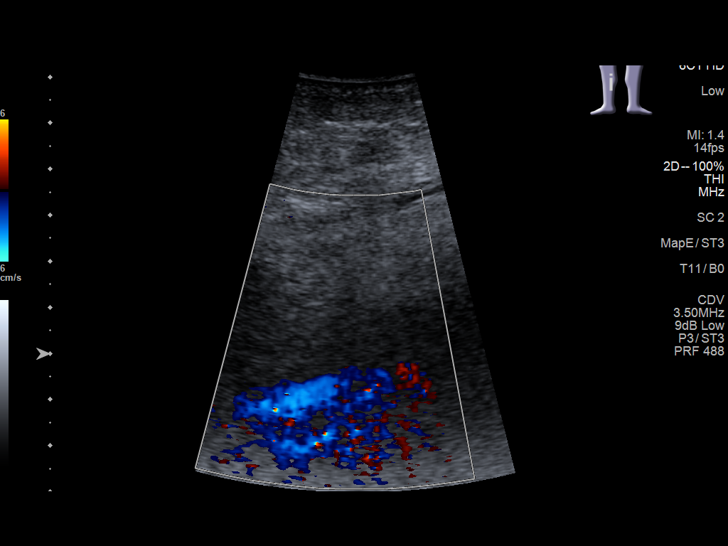

[12 of 24 positions shown; findings below may reference images not displayed]

FINDINGS: RIGHT LOWER EXTREMITY

Common Femoral Vein: No evidence of thrombus. Normal
compressibility, respiratory phasicity and response to augmentation.

Saphenofemoral Junction: No evidence of thrombus. Normal
compressibility and flow on color Doppler imaging.

Profunda Femoral Vein: No evidence of thrombus. Normal
compressibility and flow on color Doppler imaging.

Femoral Vein: No evidence of thrombus. Normal compressibility,
respiratory phasicity and response to augmentation.

Popliteal Vein: No evidence of thrombus. Normal compressibility,
respiratory phasicity and response to augmentation.

Calf Veins: No evidence of thrombus. Normal compressibility and flow
on color Doppler imaging.

Superficial Great Saphenous Vein: No evidence of thrombus. Normal
compressibility.

Venous Reflux:  None.

Other Findings:  None.

LEFT LOWER EXTREMITY

Common Femoral Vein: There is calcified deep venous thrombosis at
the junction of the distal common femoral vein and origin of the
femoral vein. There is diminished flow in this area focally and
diminished compression at this site. The remainder of the common
femoral vein appears unremarkable with no acute appearing deep
venous thrombosis. Elsewhere in this vessel, there is normal
compression and augmentation as well as normal appearing venous
Doppler signal.

Saphenofemoral Junction: No evidence of thrombus. Normal
compressibility and flow on color Doppler imaging.

Profunda Femoral Vein: No evidence of thrombus. Normal
compressibility and flow on color Doppler imaging.

Femoral Vein: Apparent chronic deep venous thrombosis at the origin
of this vessel as noted above. Elsewhere no evidence of thrombus.
Normal compressibility, respiratory phasicity and response to
augmentation.

Popliteal Vein: No evidence of thrombus. Normal compressibility,
respiratory phasicity and response to augmentation.

Calf Veins: No evidence of thrombus. Normal compressibility and flow
on color Doppler imaging.

Superficial Great Saphenous Vein: No evidence of thrombus. Normal
compressibility.

Venous Reflux:  None.

Other Findings:  None.
IMPRESSION: 1. No evidence of deep venous thrombosis in either lower extremity
in the right lower extremity.

2. Apparent chronic deep venous thrombosis, calcified, at the
junction of the distal common femoral vein and origin of the femoral
vein with diminished flow focally at this site. No acute appearing
deep venous thrombosis seen on the left. No other site of chronic
deep venous thrombosis on the left.

These results will be called to the ordering clinician or
representative by the [HOSPITAL] at the imaging location.

## 2023-04-20 NOTE — Telephone Encounter (Signed)
x
